# Patient Record
Sex: Male | Born: 1945
Health system: Southern US, Community
[De-identification: ages and names within clinical notes are randomized; demographics above are authoritative.]

## PROBLEM LIST (undated history)

## (undated) DIAGNOSIS — I119 Hypertensive heart disease without heart failure: Secondary | ICD-10-CM

## (undated) DIAGNOSIS — I4819 Other persistent atrial fibrillation: Secondary | ICD-10-CM

## (undated) DIAGNOSIS — N1831 Chronic kidney disease, stage 3a: Secondary | ICD-10-CM

## (undated) DIAGNOSIS — I429 Cardiomyopathy, unspecified: Secondary | ICD-10-CM

## (undated) DIAGNOSIS — I5022 Chronic systolic (congestive) heart failure: Secondary | ICD-10-CM

## (undated) DIAGNOSIS — D649 Anemia, unspecified: Secondary | ICD-10-CM

## (undated) DIAGNOSIS — I272 Pulmonary hypertension, unspecified: Secondary | ICD-10-CM

## (undated) DIAGNOSIS — N179 Acute kidney failure, unspecified: Secondary | ICD-10-CM

## (undated) DIAGNOSIS — C189 Malignant neoplasm of colon, unspecified: Secondary | ICD-10-CM

## (undated) DIAGNOSIS — I2510 Atherosclerotic heart disease of native coronary artery without angina pectoris: Secondary | ICD-10-CM

## (undated) DIAGNOSIS — I10 Essential (primary) hypertension: Secondary | ICD-10-CM

## (undated) DIAGNOSIS — I340 Nonrheumatic mitral (valve) insufficiency: Secondary | ICD-10-CM

## (undated) DIAGNOSIS — D126 Benign neoplasm of colon, unspecified: Secondary | ICD-10-CM

## (undated) DIAGNOSIS — E119 Type 2 diabetes mellitus without complications: Secondary | ICD-10-CM

## (undated) HISTORY — DX: Hypertensive heart disease without heart failure: I11.9

## (undated) HISTORY — DX: Atherosclerotic heart disease of native coronary artery without angina pectoris: I25.10

## (undated) HISTORY — DX: Acute kidney failure, unspecified: N17.9

## (undated) HISTORY — DX: Chronic systolic (congestive) heart failure: I50.22

## (undated) HISTORY — PX: PORTACATH PLACEMENT: SHX2246

## (undated) HISTORY — PX: COLON SURGERY: SHX602

## (undated) HISTORY — DX: Nonrheumatic mitral (valve) insufficiency: I34.0

## (undated) HISTORY — DX: Essential (primary) hypertension: I10

## (undated) HISTORY — PX: ESOPHAGOGASTRODUODENOSCOPY: SHX1529

## (undated) HISTORY — DX: Cardiomyopathy, unspecified: I42.9

## (undated) HISTORY — DX: Malignant neoplasm of colon, unspecified: C18.9

## (undated) HISTORY — PX: HERNIA REPAIR: SHX51

## (undated) HISTORY — PX: COLONOSCOPY: SHX174

## (undated) SURGERY — ECHOCARDIOGRAM, TRANSESOPHAGEAL
Anesthesia: General

---

## 2011-04-18 ENCOUNTER — Ambulatory Visit: Payer: Self-pay | Admitting: General Practice

## 2013-02-05 HISTORY — PX: PORTACATH PLACEMENT: SHX2246

## 2013-06-16 ENCOUNTER — Inpatient Hospital Stay: Payer: Self-pay | Admitting: Surgery

## 2013-06-16 LAB — CBC
HCT: 15 % — CL (ref 40.0–52.0)
HGB: 4.2 g/dL — CL (ref 13.0–18.0)
MCH: 18.5 pg — ABNORMAL LOW (ref 26.0–34.0)
MCHC: 28.3 g/dL — ABNORMAL LOW (ref 32.0–36.0)
MCV: 65 fL — AB (ref 80–100)
PLATELETS: 179 10*3/uL (ref 150–440)
RBC: 2.29 10*6/uL — ABNORMAL LOW (ref 4.40–5.90)
RDW: 21.1 % — ABNORMAL HIGH (ref 11.5–14.5)
WBC: 5.6 10*3/uL (ref 3.8–10.6)

## 2013-06-16 LAB — BASIC METABOLIC PANEL
Anion Gap: 8 (ref 7–16)
BUN: 20 mg/dL — ABNORMAL HIGH (ref 7–18)
CHLORIDE: 107 mmol/L (ref 98–107)
CO2: 26 mmol/L (ref 21–32)
Calcium, Total: 8.7 mg/dL (ref 8.5–10.1)
Creatinine: 1.31 mg/dL — ABNORMAL HIGH (ref 0.60–1.30)
EGFR (African American): >60
EGFR (Non-African Amer.): 56 — ABNORMAL LOW
Glucose: 92 mg/dL (ref 65–99)
OSMOLALITY: 284 (ref 275–301)
POTASSIUM: 3.7 mmol/L (ref 3.5–5.1)
Sodium: 141 mmol/L (ref 136–145)

## 2013-06-16 LAB — CK TOTAL AND CKMB (NOT AT ARMC)
CK, Total: 75 U/L
CK-MB: 1.2 ng/mL (ref 0.5–3.6)

## 2013-06-16 LAB — TROPONIN I: Troponin-I: <0.02 ng/mL

## 2013-06-16 LAB — PRO B NATRIURETIC PEPTIDE: B-Type Natriuretic Peptide: 4287 pg/mL — ABNORMAL HIGH (ref 0–125)

## 2013-06-16 LAB — PROTIME-INR
INR: 1.2
Prothrombin Time: 15.4 secs — ABNORMAL HIGH (ref 11.5–14.7)

## 2013-06-17 DIAGNOSIS — R0609 Other forms of dyspnea: Secondary | ICD-10-CM

## 2013-06-17 DIAGNOSIS — R0989 Other specified symptoms and signs involving the circulatory and respiratory systems: Secondary | ICD-10-CM

## 2013-06-17 DIAGNOSIS — R5383 Other fatigue: Secondary | ICD-10-CM

## 2013-06-17 DIAGNOSIS — R609 Edema, unspecified: Secondary | ICD-10-CM

## 2013-06-17 DIAGNOSIS — I10 Essential (primary) hypertension: Secondary | ICD-10-CM

## 2013-06-17 DIAGNOSIS — D509 Iron deficiency anemia, unspecified: Secondary | ICD-10-CM

## 2013-06-17 DIAGNOSIS — R5381 Other malaise: Secondary | ICD-10-CM

## 2013-06-17 LAB — PROTIME-INR
INR: 1.2
Prothrombin Time: 15.4 secs — ABNORMAL HIGH (ref 11.5–14.7)

## 2013-06-17 LAB — HEMOGLOBIN
HGB: 8.8 g/dL — ABNORMAL LOW (ref 13.0–18.0)
HGB: 9.8 g/dL — ABNORMAL LOW (ref 13.0–18.0)

## 2013-06-17 LAB — RETICULOCYTES
Absolute Retic Count: 0.0529 10*6/uL (ref 0.019–0.186)
Reticulocyte: 1.81 % (ref 0.4–3.1)

## 2013-06-17 LAB — COMPREHENSIVE METABOLIC PANEL
ALBUMIN: 2.5 g/dL — AB (ref 3.4–5.0)
ANION GAP: 8 (ref 7–16)
Alkaline Phosphatase: 87 U/L
BILIRUBIN TOTAL: 1.2 mg/dL — AB (ref 0.2–1.0)
BUN: 19 mg/dL — AB (ref 7–18)
CALCIUM: 8.3 mg/dL — AB (ref 8.5–10.1)
CHLORIDE: 107 mmol/L (ref 98–107)
CO2: 27 mmol/L (ref 21–32)
Creatinine: 1.29 mg/dL (ref 0.60–1.30)
EGFR (Non-African Amer.): 57 — ABNORMAL LOW
Glucose: 92 mg/dL (ref 65–99)
OSMOLALITY: 285 (ref 275–301)
POTASSIUM: 3.8 mmol/L (ref 3.5–5.1)
SGOT(AST): 16 U/L (ref 15–37)
SGPT (ALT): 28 U/L (ref 12–78)
Sodium: 142 mmol/L (ref 136–145)
Total Protein: 6.2 g/dL — ABNORMAL LOW (ref 6.4–8.2)

## 2013-06-17 LAB — FERRITIN: Ferritin (ARMC): 7 ng/mL — ABNORMAL LOW (ref 8–388)

## 2013-06-17 LAB — CBC WITH DIFFERENTIAL/PLATELET
Basophil #: 0.1 10*3/uL (ref 0.0–0.1)
Basophil %: 1 %
EOS ABS: 0 10*3/uL (ref 0.0–0.7)
EOS PCT: 0.4 %
HCT: 20.8 % — ABNORMAL LOW (ref 40.0–52.0)
HGB: 6.3 g/dL — AB (ref 13.0–18.0)
LYMPHS PCT: 12.8 %
Lymphocyte #: 0.9 10*3/uL — ABNORMAL LOW (ref 1.0–3.6)
MCH: 21.8 pg — AB (ref 26.0–34.0)
MCHC: 30.3 g/dL — ABNORMAL LOW (ref 32.0–36.0)
MCV: 72 fL — ABNORMAL LOW (ref 80–100)
MONOS PCT: 11.9 %
Monocyte #: 0.9 x10 3/mm (ref 0.2–1.0)
Neutrophil #: 5.5 10*3/uL (ref 1.4–6.5)
Neutrophil %: 73.9 %
PLATELETS: 157 10*3/uL (ref 150–440)
RBC: 2.9 10*6/uL — ABNORMAL LOW (ref 4.40–5.90)
RDW: 25.2 % — ABNORMAL HIGH (ref 11.5–14.5)
WBC: 7.4 10*3/uL (ref 3.8–10.6)

## 2013-06-17 LAB — IRON AND TIBC
IRON SATURATION: 33 %
IRON: 103 ug/dL (ref 65–175)
Iron Bind.Cap.(Total): 315 ug/dL (ref 250–450)
UNBOUND IRON-BIND. CAP.: 212 ug/dL

## 2013-06-17 LAB — HEMATOCRIT
HCT: 28.2 % — ABNORMAL LOW (ref 40.0–52.0)
HCT: 32.1 % — AB (ref 40.0–52.0)

## 2013-06-17 LAB — LACTATE DEHYDROGENASE: LDH: 164 U/L (ref 85–241)

## 2013-06-18 DIAGNOSIS — I059 Rheumatic mitral valve disease, unspecified: Secondary | ICD-10-CM

## 2013-06-18 LAB — PROTIME-INR
INR: 1.3
Prothrombin Time: 15.9 secs — ABNORMAL HIGH (ref 11.5–14.7)

## 2013-06-18 LAB — CBC WITH DIFFERENTIAL/PLATELET
BASOS ABS: 0.1 10*3/uL (ref 0.0–0.1)
BASOS PCT: 0.6 %
Eosinophil #: 0.1 10*3/uL (ref 0.0–0.7)
Eosinophil %: 1 %
HCT: 28.8 % — AB (ref 40.0–52.0)
HGB: 9 g/dL — ABNORMAL LOW (ref 13.0–18.0)
Lymphocyte #: 0.7 10*3/uL — ABNORMAL LOW (ref 1.0–3.6)
Lymphocyte %: 7.6 %
MCH: 23.4 pg — ABNORMAL LOW (ref 26.0–34.0)
MCHC: 31.1 g/dL — ABNORMAL LOW (ref 32.0–36.0)
MCV: 75 fL — ABNORMAL LOW (ref 80–100)
Monocyte #: 1.1 x10 3/mm — ABNORMAL HIGH (ref 0.2–1.0)
Monocyte %: 12 %
NEUTROS ABS: 7.2 10*3/uL — AB (ref 1.4–6.5)
Neutrophil %: 78.8 %
Platelet: 157 10*3/uL (ref 150–440)
RBC: 3.82 10*6/uL — AB (ref 4.40–5.90)
RDW: 25 % — AB (ref 11.5–14.5)
WBC: 9.1 10*3/uL (ref 3.8–10.6)

## 2013-06-18 LAB — HEMOGLOBIN
HGB: 10.1 g/dL — ABNORMAL LOW (ref 13.0–18.0)
HGB: 11.1 g/dL — ABNORMAL LOW (ref 13.0–18.0)

## 2013-06-18 LAB — APTT: Activated PTT: 32.6 secs (ref 23.6–35.9)

## 2013-06-18 LAB — HEMATOCRIT
HCT: 33.2 % — ABNORMAL LOW (ref 40.0–52.0)
HCT: 37.7 % — ABNORMAL LOW (ref 40.0–52.0)

## 2013-06-19 DIAGNOSIS — C189 Malignant neoplasm of colon, unspecified: Secondary | ICD-10-CM

## 2013-06-19 DIAGNOSIS — D509 Iron deficiency anemia, unspecified: Secondary | ICD-10-CM

## 2013-06-19 DIAGNOSIS — R0609 Other forms of dyspnea: Secondary | ICD-10-CM

## 2013-06-19 DIAGNOSIS — R0989 Other specified symptoms and signs involving the circulatory and respiratory systems: Secondary | ICD-10-CM

## 2013-06-19 DIAGNOSIS — R5381 Other malaise: Secondary | ICD-10-CM

## 2013-06-19 DIAGNOSIS — R5383 Other fatigue: Secondary | ICD-10-CM

## 2013-06-19 DIAGNOSIS — I10 Essential (primary) hypertension: Secondary | ICD-10-CM

## 2013-06-19 HISTORY — DX: Malignant neoplasm of colon, unspecified: C18.9

## 2013-06-19 LAB — BASIC METABOLIC PANEL
Anion Gap: 5 — ABNORMAL LOW (ref 7–16)
BUN: 10 mg/dL (ref 7–18)
CO2: 26 mmol/L (ref 21–32)
Calcium, Total: 8 mg/dL — ABNORMAL LOW (ref 8.5–10.1)
Chloride: 111 mmol/L — ABNORMAL HIGH (ref 98–107)
Creatinine: 1.09 mg/dL (ref 0.60–1.30)
EGFR (African American): >60
Glucose: 77 mg/dL (ref 65–99)
OSMOLALITY: 281 (ref 275–301)
POTASSIUM: 3.5 mmol/L (ref 3.5–5.1)
Sodium: 142 mmol/L (ref 136–145)

## 2013-06-19 LAB — HEMOGLOBIN: HGB: 8.8 g/dL — AB (ref 13.0–18.0)

## 2013-06-19 LAB — BILIRUBIN, DIRECT: Bilirubin, Direct: 0.2 mg/dL (ref 0.00–0.20)

## 2013-06-19 LAB — PROTIME-INR
INR: 1.3
Prothrombin Time: 15.7 secs — ABNORMAL HIGH (ref 11.5–14.7)

## 2013-06-19 LAB — BILIRUBIN, TOTAL: BILIRUBIN TOTAL: 0.7 mg/dL (ref 0.2–1.0)

## 2013-06-20 LAB — CBC WITH DIFFERENTIAL/PLATELET
BASOS ABS: 0.1 10*3/uL (ref 0.0–0.1)
Basophil %: 0.8 %
Eosinophil #: 0.1 10*3/uL (ref 0.0–0.7)
Eosinophil %: 1.7 %
HCT: 27.2 % — ABNORMAL LOW (ref 40.0–52.0)
HGB: 8.4 g/dL — ABNORMAL LOW (ref 13.0–18.0)
LYMPHS ABS: 0.7 10*3/uL — AB (ref 1.0–3.6)
Lymphocyte %: 9.7 %
MCH: 23.6 pg — ABNORMAL LOW (ref 26.0–34.0)
MCHC: 30.9 g/dL — ABNORMAL LOW (ref 32.0–36.0)
MCV: 76 fL — AB (ref 80–100)
MONOS PCT: 14 %
Monocyte #: 1.1 x10 3/mm — ABNORMAL HIGH (ref 0.2–1.0)
Neutrophil #: 5.6 10*3/uL (ref 1.4–6.5)
Neutrophil %: 73.8 %
Platelet: 177 10*3/uL (ref 150–440)
RBC: 3.56 10*6/uL — AB (ref 4.40–5.90)
RDW: 25.5 % — AB (ref 11.5–14.5)
WBC: 7.6 10*3/uL (ref 3.8–10.6)

## 2013-06-21 LAB — CBC WITH DIFFERENTIAL/PLATELET
Basophil #: 0 10*3/uL (ref 0.0–0.1)
Basophil %: 0.5 %
Eosinophil #: 0.1 10*3/uL (ref 0.0–0.7)
Eosinophil %: 1.7 %
HCT: 28.1 % — AB (ref 40.0–52.0)
HGB: 8.8 g/dL — ABNORMAL LOW (ref 13.0–18.0)
LYMPHS ABS: 0.9 10*3/uL — AB (ref 1.0–3.6)
Lymphocyte %: 12.8 %
MCH: 24.1 pg — ABNORMAL LOW (ref 26.0–34.0)
MCHC: 31.4 g/dL — ABNORMAL LOW (ref 32.0–36.0)
MCV: 77 fL — ABNORMAL LOW (ref 80–100)
MONO ABS: 1 x10 3/mm (ref 0.2–1.0)
Monocyte %: 12.9 %
NEUTROS ABS: 5.3 10*3/uL (ref 1.4–6.5)
NEUTROS PCT: 72.1 %
Platelet: 163 10*3/uL (ref 150–440)
RBC: 3.66 10*6/uL — AB (ref 4.40–5.90)
RDW: 24.7 % — ABNORMAL HIGH (ref 11.5–14.5)
WBC: 7.4 10*3/uL (ref 3.8–10.6)

## 2013-06-21 LAB — BASIC METABOLIC PANEL
Anion Gap: 5 — ABNORMAL LOW (ref 7–16)
BUN: 6 mg/dL — AB (ref 7–18)
CALCIUM: 7.6 mg/dL — AB (ref 8.5–10.1)
CREATININE: 0.91 mg/dL (ref 0.60–1.30)
Chloride: 109 mmol/L — ABNORMAL HIGH (ref 98–107)
Co2: 28 mmol/L (ref 21–32)
EGFR (African American): >60
GLUCOSE: 84 mg/dL (ref 65–99)
Osmolality: 280 (ref 275–301)
POTASSIUM: 3.2 mmol/L — AB (ref 3.5–5.1)
SODIUM: 142 mmol/L (ref 136–145)

## 2013-06-22 LAB — CBC WITH DIFFERENTIAL/PLATELET
Basophil #: 0 10*3/uL (ref 0.0–0.1)
Basophil %: 0.4 %
Eosinophil #: 0.2 10*3/uL (ref 0.0–0.7)
Eosinophil %: 2.2 %
HCT: 30.4 % — AB (ref 40.0–52.0)
HGB: 9.5 g/dL — ABNORMAL LOW (ref 13.0–18.0)
LYMPHS ABS: 0.9 10*3/uL — AB (ref 1.0–3.6)
LYMPHS PCT: 11.2 %
MCH: 24.3 pg — ABNORMAL LOW (ref 26.0–34.0)
MCHC: 31.2 g/dL — ABNORMAL LOW (ref 32.0–36.0)
MCV: 78 fL — AB (ref 80–100)
MONO ABS: 1.1 x10 3/mm — AB (ref 0.2–1.0)
MONOS PCT: 12.9 %
Neutrophil #: 6 10*3/uL (ref 1.4–6.5)
Neutrophil %: 73.3 %
Platelet: 167 10*3/uL (ref 150–440)
RBC: 3.91 10*6/uL — AB (ref 4.40–5.90)
RDW: 24.7 % — AB (ref 11.5–14.5)
WBC: 8.2 10*3/uL (ref 3.8–10.6)

## 2013-06-22 LAB — PROTIME-INR
INR: 1
Prothrombin Time: 12.8 secs (ref 11.5–14.7)

## 2013-06-22 LAB — BASIC METABOLIC PANEL
ANION GAP: 4 — AB (ref 7–16)
BUN: 10 mg/dL (ref 7–18)
CREATININE: 1.01 mg/dL (ref 0.60–1.30)
Calcium, Total: 7.9 mg/dL — ABNORMAL LOW (ref 8.5–10.1)
Chloride: 108 mmol/L — ABNORMAL HIGH (ref 98–107)
Co2: 29 mmol/L (ref 21–32)
EGFR (African American): >60
Glucose: 91 mg/dL (ref 65–99)
OSMOLALITY: 280 (ref 275–301)
Potassium: 3.6 mmol/L (ref 3.5–5.1)
Sodium: 141 mmol/L (ref 136–145)

## 2013-06-22 LAB — CEA: CEA: 4.3 ng/mL (ref 0.0–4.7)

## 2013-06-22 LAB — MAGNESIUM: Magnesium: 1.7 mg/dL — ABNORMAL LOW

## 2013-06-23 LAB — BASIC METABOLIC PANEL
Anion Gap: 5 — ABNORMAL LOW (ref 7–16)
BUN: 6 mg/dL — AB (ref 7–18)
CHLORIDE: 109 mmol/L — AB (ref 98–107)
CREATININE: 1.02 mg/dL (ref 0.60–1.30)
Calcium, Total: 8 mg/dL — ABNORMAL LOW (ref 8.5–10.1)
Co2: 28 mmol/L (ref 21–32)
EGFR (Non-African Amer.): >60
Glucose: 77 mg/dL (ref 65–99)
OSMOLALITY: 280 (ref 275–301)
Potassium: 3.6 mmol/L (ref 3.5–5.1)
SODIUM: 142 mmol/L (ref 136–145)

## 2013-06-23 LAB — CBC WITH DIFFERENTIAL/PLATELET
Basophil #: 0 10*3/uL (ref 0.0–0.1)
Basophil %: 0.7 %
EOS ABS: 0.1 10*3/uL (ref 0.0–0.7)
Eosinophil %: 2 %
HCT: 30 % — AB (ref 40.0–52.0)
HGB: 9.7 g/dL — AB (ref 13.0–18.0)
Lymphocyte #: 0.9 10*3/uL — ABNORMAL LOW (ref 1.0–3.6)
Lymphocyte %: 12.6 %
MCH: 25.2 pg — ABNORMAL LOW (ref 26.0–34.0)
MCHC: 32.4 g/dL (ref 32.0–36.0)
MCV: 78 fL — ABNORMAL LOW (ref 80–100)
MONO ABS: 0.9 x10 3/mm (ref 0.2–1.0)
MONOS PCT: 13.2 %
NEUTROS PCT: 71.5 %
Neutrophil #: 5.1 10*3/uL (ref 1.4–6.5)
Platelet: 157 10*3/uL (ref 150–440)
RBC: 3.86 10*6/uL — ABNORMAL LOW (ref 4.40–5.90)
RDW: 24.5 % — AB (ref 11.5–14.5)
WBC: 7.1 10*3/uL (ref 3.8–10.6)

## 2013-06-23 LAB — MAGNESIUM: MAGNESIUM: 1.9 mg/dL

## 2013-06-24 LAB — CBC WITH DIFFERENTIAL/PLATELET
Basophil #: 0.1 10*3/uL (ref 0.0–0.1)
Basophil %: 0.7 %
EOS PCT: 0 %
Eosinophil #: 0 10*3/uL (ref 0.0–0.7)
HCT: 35.2 % — ABNORMAL LOW (ref 40.0–52.0)
HGB: 10.8 g/dL — AB (ref 13.0–18.0)
LYMPHS ABS: 0.5 10*3/uL — AB (ref 1.0–3.6)
LYMPHS PCT: 3.4 %
MCH: 24.7 pg — AB (ref 26.0–34.0)
MCHC: 30.6 g/dL — AB (ref 32.0–36.0)
MCV: 81 fL (ref 80–100)
MONO ABS: 1 x10 3/mm (ref 0.2–1.0)
Monocyte %: 7 %
NEUTROS ABS: 12.5 10*3/uL — AB (ref 1.4–6.5)
NEUTROS PCT: 88.9 %
Platelet: 183 10*3/uL (ref 150–440)
RBC: 4.37 10*6/uL — ABNORMAL LOW (ref 4.40–5.90)
RDW: 24.8 % — ABNORMAL HIGH (ref 11.5–14.5)
WBC: 14.1 10*3/uL — ABNORMAL HIGH (ref 3.8–10.6)

## 2013-06-24 LAB — BASIC METABOLIC PANEL
Anion Gap: 10 (ref 7–16)
BUN: 10 mg/dL (ref 7–18)
CALCIUM: 8.1 mg/dL — AB (ref 8.5–10.1)
CHLORIDE: 109 mmol/L — AB (ref 98–107)
CO2: 22 mmol/L (ref 21–32)
CREATININE: 1.73 mg/dL — AB (ref 0.60–1.30)
GFR CALC AF AMER: 47 — AB
GFR CALC NON AF AMER: 40 — AB
Glucose: 177 mg/dL — ABNORMAL HIGH (ref 65–99)
Osmolality: 285 (ref 275–301)
Potassium: 4.8 mmol/L (ref 3.5–5.1)
Sodium: 141 mmol/L (ref 136–145)

## 2013-06-24 LAB — PATHOLOGY REPORT

## 2013-06-25 LAB — BASIC METABOLIC PANEL
ANION GAP: 8 (ref 7–16)
BUN: 10 mg/dL (ref 7–18)
CO2: 25 mmol/L (ref 21–32)
Calcium, Total: 8.1 mg/dL — ABNORMAL LOW (ref 8.5–10.1)
Chloride: 109 mmol/L — ABNORMAL HIGH (ref 98–107)
Creatinine: 1.09 mg/dL (ref 0.60–1.30)
EGFR (Non-African Amer.): >60
Glucose: 97 mg/dL (ref 65–99)
Osmolality: 282 (ref 275–301)
POTASSIUM: 5 mmol/L (ref 3.5–5.1)
Sodium: 142 mmol/L (ref 136–145)

## 2013-06-26 LAB — PATHOLOGY REPORT

## 2013-07-08 ENCOUNTER — Ambulatory Visit: Payer: Self-pay | Admitting: Hematology and Oncology

## 2013-07-08 LAB — BASIC METABOLIC PANEL
Anion Gap: 5 — ABNORMAL LOW (ref 7–16)
BUN: 13 mg/dL (ref 7–18)
CHLORIDE: 103 mmol/L (ref 98–107)
Calcium, Total: 9.3 mg/dL (ref 8.5–10.1)
Co2: 35 mmol/L — ABNORMAL HIGH (ref 21–32)
Creatinine: 1.24 mg/dL (ref 0.60–1.30)
EGFR (Non-African Amer.): >60
Glucose: 84 mg/dL (ref 65–99)
Osmolality: 284 (ref 275–301)
POTASSIUM: 3.7 mmol/L (ref 3.5–5.1)
Sodium: 143 mmol/L (ref 136–145)

## 2013-07-15 ENCOUNTER — Ambulatory Visit: Payer: Self-pay | Admitting: Surgery

## 2013-07-15 ENCOUNTER — Ambulatory Visit: Payer: Self-pay | Admitting: Hematology and Oncology

## 2013-07-15 LAB — POTASSIUM: Potassium: 3.9 mmol/L (ref 3.5–5.1)

## 2013-07-20 ENCOUNTER — Ambulatory Visit: Payer: Self-pay | Admitting: Surgery

## 2013-07-22 LAB — COMPREHENSIVE METABOLIC PANEL
ALK PHOS: 64 U/L
ANION GAP: 6 — AB (ref 7–16)
Albumin: 3.1 g/dL — ABNORMAL LOW (ref 3.4–5.0)
BUN: 10 mg/dL (ref 7–18)
Bilirubin,Total: 0.4 mg/dL (ref 0.2–1.0)
CALCIUM: 9.1 mg/dL (ref 8.5–10.1)
CHLORIDE: 108 mmol/L — AB (ref 98–107)
CO2: 30 mmol/L (ref 21–32)
CREATININE: 1.05 mg/dL (ref 0.60–1.30)
EGFR (African American): >60
Glucose: 92 mg/dL (ref 65–99)
Osmolality: 286 (ref 275–301)
Potassium: 3.5 mmol/L (ref 3.5–5.1)
SGOT(AST): 14 U/L — ABNORMAL LOW (ref 15–37)
SGPT (ALT): 20 U/L (ref 12–78)
Sodium: 144 mmol/L (ref 136–145)
Total Protein: 7.1 g/dL (ref 6.4–8.2)

## 2013-07-22 LAB — CBC CANCER CENTER
Basophil #: 0 x10 3/mm (ref 0.0–0.1)
Basophil %: 0.6 %
EOS ABS: 0.1 x10 3/mm (ref 0.0–0.7)
EOS PCT: 1.9 %
HCT: 31 % — AB (ref 40.0–52.0)
HGB: 9.9 g/dL — AB (ref 13.0–18.0)
Lymphocyte #: 1 x10 3/mm (ref 1.0–3.6)
Lymphocyte %: 23.6 %
MCH: 26.4 pg (ref 26.0–34.0)
MCHC: 31.9 g/dL — ABNORMAL LOW (ref 32.0–36.0)
MCV: 83 fL (ref 80–100)
MONOS PCT: 12 %
Monocyte #: 0.5 x10 3/mm (ref 0.2–1.0)
Neutrophil #: 2.7 x10 3/mm (ref 1.4–6.5)
Neutrophil %: 61.9 %
Platelet: 128 x10 3/mm — ABNORMAL LOW (ref 150–440)
RBC: 3.76 10*6/uL — ABNORMAL LOW (ref 4.40–5.90)
RDW: 25.2 % — ABNORMAL HIGH (ref 11.5–14.5)
WBC: 4.4 x10 3/mm (ref 3.8–10.6)

## 2013-07-23 LAB — CEA: CEA: 1.5 ng/mL (ref 0.0–4.7)

## 2013-08-05 ENCOUNTER — Ambulatory Visit: Payer: Self-pay | Admitting: Hematology and Oncology

## 2013-08-05 LAB — CBC CANCER CENTER
Basophil #: 0 x10 3/mm (ref 0.0–0.1)
Basophil %: 0.7 %
EOS PCT: 2.2 %
Eosinophil #: 0.1 x10 3/mm (ref 0.0–0.7)
HCT: 30.3 % — ABNORMAL LOW (ref 40.0–52.0)
HGB: 9.8 g/dL — ABNORMAL LOW (ref 13.0–18.0)
LYMPHS ABS: 0.7 x10 3/mm — AB (ref 1.0–3.6)
LYMPHS PCT: 22.6 %
MCH: 26.9 pg (ref 26.0–34.0)
MCHC: 32.3 g/dL (ref 32.0–36.0)
MCV: 83 fL (ref 80–100)
MONO ABS: 0.5 x10 3/mm (ref 0.2–1.0)
Monocyte %: 14.9 %
NEUTROS ABS: 1.8 x10 3/mm (ref 1.4–6.5)
Neutrophil %: 59.6 %
Platelet: 103 x10 3/mm — ABNORMAL LOW (ref 150–440)
RBC: 3.65 10*6/uL — AB (ref 4.40–5.90)
RDW: 23.7 % — AB (ref 11.5–14.5)
WBC: 3.1 x10 3/mm — ABNORMAL LOW (ref 3.8–10.6)

## 2013-08-05 LAB — COMPREHENSIVE METABOLIC PANEL
AST: 15 U/L (ref 15–37)
Albumin: 3.1 g/dL — ABNORMAL LOW (ref 3.4–5.0)
Alkaline Phosphatase: 68 U/L
Anion Gap: 5 — ABNORMAL LOW (ref 7–16)
BUN: 10 mg/dL (ref 7–18)
Bilirubin,Total: 0.6 mg/dL (ref 0.2–1.0)
CALCIUM: 8.5 mg/dL (ref 8.5–10.1)
CREATININE: 1 mg/dL (ref 0.60–1.30)
Chloride: 108 mmol/L — ABNORMAL HIGH (ref 98–107)
Co2: 31 mmol/L (ref 21–32)
EGFR (African American): >60
GLUCOSE: 88 mg/dL (ref 65–99)
OSMOLALITY: 285 (ref 275–301)
Potassium: 3.6 mmol/L (ref 3.5–5.1)
SGPT (ALT): 14 U/L (ref 12–78)
SODIUM: 144 mmol/L (ref 136–145)
Total Protein: 6.8 g/dL (ref 6.4–8.2)

## 2013-08-19 LAB — CBC CANCER CENTER
BASOS PCT: 0.7 %
Basophil #: 0 x10 3/mm (ref 0.0–0.1)
EOS ABS: 0 x10 3/mm (ref 0.0–0.7)
EOS PCT: 0.4 %
HCT: 30.3 % — AB (ref 40.0–52.0)
HGB: 10.1 g/dL — AB (ref 13.0–18.0)
Lymphocyte #: 0.8 x10 3/mm — ABNORMAL LOW (ref 1.0–3.6)
Lymphocyte %: 26.8 %
MCH: 28.3 pg (ref 26.0–34.0)
MCHC: 33.3 g/dL (ref 32.0–36.0)
MCV: 85 fL (ref 80–100)
MONO ABS: 0.6 x10 3/mm (ref 0.2–1.0)
MONOS PCT: 18.9 %
Neutrophil #: 1.6 x10 3/mm (ref 1.4–6.5)
Neutrophil %: 53.2 %
PLATELETS: 98 x10 3/mm — AB (ref 150–440)
RBC: 3.57 10*6/uL — ABNORMAL LOW (ref 4.40–5.90)
RDW: 22.4 % — ABNORMAL HIGH (ref 11.5–14.5)
WBC: 2.9 x10 3/mm — ABNORMAL LOW (ref 3.8–10.6)

## 2013-08-19 LAB — COMPREHENSIVE METABOLIC PANEL
ALBUMIN: 3.2 g/dL — AB (ref 3.4–5.0)
ALK PHOS: 74 U/L
Anion Gap: 9 (ref 7–16)
BILIRUBIN TOTAL: 0.5 mg/dL (ref 0.2–1.0)
BUN: 9 mg/dL (ref 7–18)
Calcium, Total: 8.5 mg/dL (ref 8.5–10.1)
Chloride: 108 mmol/L — ABNORMAL HIGH (ref 98–107)
Co2: 27 mmol/L (ref 21–32)
Creatinine: 1.12 mg/dL (ref 0.60–1.30)
GLUCOSE: 141 mg/dL — AB (ref 65–99)
Osmolality: 288 (ref 275–301)
Potassium: 3.1 mmol/L — ABNORMAL LOW (ref 3.5–5.1)
SGOT(AST): 16 U/L (ref 15–37)
SGPT (ALT): 14 U/L (ref 12–78)
SODIUM: 144 mmol/L (ref 136–145)
TOTAL PROTEIN: 6.7 g/dL (ref 6.4–8.2)

## 2013-08-26 LAB — CBC CANCER CENTER
BASOS PCT: 1.1 %
Basophil #: 0 x10 3/mm (ref 0.0–0.1)
Eosinophil #: 0 x10 3/mm (ref 0.0–0.7)
Eosinophil %: 0.4 %
HCT: 32.1 % — AB (ref 40.0–52.0)
HGB: 10.6 g/dL — ABNORMAL LOW (ref 13.0–18.0)
LYMPHS ABS: 0.9 x10 3/mm — AB (ref 1.0–3.6)
LYMPHS PCT: 31.6 %
MCH: 28.8 pg (ref 26.0–34.0)
MCHC: 32.8 g/dL (ref 32.0–36.0)
MCV: 88 fL (ref 80–100)
MONO ABS: 0.8 x10 3/mm (ref 0.2–1.0)
MONOS PCT: 30.3 %
NEUTROS ABS: 1 x10 3/mm — AB (ref 1.4–6.5)
Neutrophil %: 36.6 %
PLATELETS: 163 x10 3/mm (ref 150–440)
RBC: 3.67 10*6/uL — AB (ref 4.40–5.90)
RDW: 22 % — AB (ref 11.5–14.5)
WBC: 2.7 x10 3/mm — ABNORMAL LOW (ref 3.8–10.6)

## 2013-08-26 LAB — COMPREHENSIVE METABOLIC PANEL
ALK PHOS: 77 U/L
Albumin: 3.2 g/dL — ABNORMAL LOW (ref 3.4–5.0)
Anion Gap: 6 — ABNORMAL LOW (ref 7–16)
BUN: 9 mg/dL (ref 7–18)
Bilirubin,Total: 0.7 mg/dL (ref 0.2–1.0)
CREATININE: 0.93 mg/dL (ref 0.60–1.30)
Calcium, Total: 8.7 mg/dL (ref 8.5–10.1)
Chloride: 106 mmol/L (ref 98–107)
Co2: 29 mmol/L (ref 21–32)
EGFR (Non-African Amer.): >60
Glucose: 84 mg/dL (ref 65–99)
Osmolality: 279 (ref 275–301)
POTASSIUM: 4.1 mmol/L (ref 3.5–5.1)
SGOT(AST): 21 U/L (ref 15–37)
SGPT (ALT): 15 U/L
SODIUM: 141 mmol/L (ref 136–145)
TOTAL PROTEIN: 6.8 g/dL (ref 6.4–8.2)

## 2013-09-02 LAB — CBC CANCER CENTER
BASOS PCT: 0.9 %
Basophil #: 0 x10 3/mm (ref 0.0–0.1)
EOS ABS: 0 x10 3/mm (ref 0.0–0.7)
Eosinophil %: 1 %
HCT: 32.5 % — ABNORMAL LOW (ref 40.0–52.0)
HGB: 11 g/dL — ABNORMAL LOW (ref 13.0–18.0)
LYMPHS ABS: 0.7 x10 3/mm — AB (ref 1.0–3.6)
Lymphocyte %: 16.3 %
MCH: 30.2 pg (ref 26.0–34.0)
MCHC: 34 g/dL (ref 32.0–36.0)
MCV: 89 fL (ref 80–100)
MONOS PCT: 16.5 %
Monocyte #: 0.7 x10 3/mm (ref 0.2–1.0)
NEUTROS PCT: 65.3 %
Neutrophil #: 2.9 x10 3/mm (ref 1.4–6.5)
PLATELETS: 168 x10 3/mm (ref 150–440)
RBC: 3.66 10*6/uL — ABNORMAL LOW (ref 4.40–5.90)
RDW: 20.6 % — ABNORMAL HIGH (ref 11.5–14.5)
WBC: 4.5 x10 3/mm (ref 3.8–10.6)

## 2013-09-02 LAB — COMPREHENSIVE METABOLIC PANEL
ALBUMIN: 3.4 g/dL (ref 3.4–5.0)
ALT: 17 U/L
Alkaline Phosphatase: 76 U/L
Anion Gap: 6 — ABNORMAL LOW (ref 7–16)
BILIRUBIN TOTAL: 0.6 mg/dL (ref 0.2–1.0)
BUN: 13 mg/dL (ref 7–18)
CREATININE: 1.19 mg/dL (ref 0.60–1.30)
Calcium, Total: 9.3 mg/dL (ref 8.5–10.1)
Chloride: 107 mmol/L (ref 98–107)
Co2: 29 mmol/L (ref 21–32)
EGFR (African American): >60
Glucose: 96 mg/dL (ref 65–99)
OSMOLALITY: 283 (ref 275–301)
Potassium: 3.9 mmol/L (ref 3.5–5.1)
SGOT(AST): 19 U/L (ref 15–37)
SODIUM: 142 mmol/L (ref 136–145)
Total Protein: 7.1 g/dL (ref 6.4–8.2)

## 2013-09-05 ENCOUNTER — Ambulatory Visit: Payer: Self-pay | Admitting: Hematology and Oncology

## 2013-09-16 LAB — COMPREHENSIVE METABOLIC PANEL
ALBUMIN: 3.2 g/dL — AB (ref 3.4–5.0)
ANION GAP: 7 (ref 7–16)
Alkaline Phosphatase: 97 U/L
BILIRUBIN TOTAL: 0.4 mg/dL (ref 0.2–1.0)
BUN: 7 mg/dL (ref 7–18)
CALCIUM: 8.6 mg/dL (ref 8.5–10.1)
CHLORIDE: 106 mmol/L (ref 98–107)
CO2: 29 mmol/L (ref 21–32)
CREATININE: 0.99 mg/dL (ref 0.60–1.30)
EGFR (African American): >60
EGFR (Non-African Amer.): >60
Glucose: 96 mg/dL (ref 65–99)
OSMOLALITY: 281 (ref 275–301)
POTASSIUM: 3.6 mmol/L (ref 3.5–5.1)
SGOT(AST): 20 U/L (ref 15–37)
SGPT (ALT): 20 U/L
Sodium: 142 mmol/L (ref 136–145)
Total Protein: 6.7 g/dL (ref 6.4–8.2)

## 2013-09-16 LAB — CBC CANCER CENTER
BASOS ABS: 0 x10 3/mm (ref 0.0–0.1)
Basophil %: 0.4 %
Eosinophil #: 0.1 x10 3/mm (ref 0.0–0.7)
Eosinophil %: 0.9 %
HCT: 33.6 % — AB (ref 40.0–52.0)
HGB: 11 g/dL — ABNORMAL LOW (ref 13.0–18.0)
LYMPHS ABS: 1 x10 3/mm (ref 1.0–3.6)
Lymphocyte %: 11.1 %
MCH: 29.8 pg (ref 26.0–34.0)
MCHC: 32.8 g/dL (ref 32.0–36.0)
MCV: 91 fL (ref 80–100)
Monocyte #: 0.8 x10 3/mm (ref 0.2–1.0)
Monocyte %: 9.3 %
Neutrophil #: 7.1 x10 3/mm — ABNORMAL HIGH (ref 1.4–6.5)
Neutrophil %: 78.3 %
Platelet: 111 x10 3/mm — ABNORMAL LOW (ref 150–440)
RBC: 3.7 10*6/uL — ABNORMAL LOW (ref 4.40–5.90)
RDW: 18.4 % — ABNORMAL HIGH (ref 11.5–14.5)
WBC: 9.1 x10 3/mm (ref 3.8–10.6)

## 2013-09-30 LAB — CBC CANCER CENTER
BASOS ABS: 0 x10 3/mm (ref 0.0–0.1)
Basophil %: 0.3 %
Eosinophil #: 0 x10 3/mm (ref 0.0–0.7)
Eosinophil %: 0.5 %
HCT: 34.2 % — ABNORMAL LOW (ref 40.0–52.0)
HGB: 11.4 g/dL — AB (ref 13.0–18.0)
LYMPHS ABS: 1.1 x10 3/mm (ref 1.0–3.6)
LYMPHS PCT: 11 %
MCH: 30.4 pg (ref 26.0–34.0)
MCHC: 33.2 g/dL (ref 32.0–36.0)
MCV: 92 fL (ref 80–100)
MONO ABS: 1.3 x10 3/mm — AB (ref 0.2–1.0)
Monocyte %: 12.5 %
Neutrophil #: 7.6 x10 3/mm — ABNORMAL HIGH (ref 1.4–6.5)
Neutrophil %: 75.7 %
Platelet: 127 x10 3/mm — ABNORMAL LOW (ref 150–440)
RBC: 3.74 10*6/uL — ABNORMAL LOW (ref 4.40–5.90)
RDW: 16.9 % — ABNORMAL HIGH (ref 11.5–14.5)
WBC: 10 x10 3/mm (ref 3.8–10.6)

## 2013-09-30 LAB — COMPREHENSIVE METABOLIC PANEL
Albumin: 3.2 g/dL — ABNORMAL LOW (ref 3.4–5.0)
Alkaline Phosphatase: 111 U/L
Anion Gap: 8 (ref 7–16)
BUN: 7 mg/dL (ref 7–18)
Bilirubin,Total: 0.3 mg/dL (ref 0.2–1.0)
CHLORIDE: 107 mmol/L (ref 98–107)
Calcium, Total: 8.6 mg/dL (ref 8.5–10.1)
Co2: 29 mmol/L (ref 21–32)
Creatinine: 1.18 mg/dL (ref 0.60–1.30)
EGFR (African American): >60
Glucose: 102 mg/dL — ABNORMAL HIGH (ref 65–99)
Osmolality: 285 (ref 275–301)
Potassium: 3.7 mmol/L (ref 3.5–5.1)
SGOT(AST): 27 U/L (ref 15–37)
SGPT (ALT): 36 U/L
SODIUM: 144 mmol/L (ref 136–145)
Total Protein: 7 g/dL (ref 6.4–8.2)

## 2013-10-06 ENCOUNTER — Ambulatory Visit: Payer: Self-pay | Admitting: Hematology and Oncology

## 2013-10-14 LAB — CBC CANCER CENTER
BASOS ABS: 0 x10 3/mm (ref 0.0–0.1)
Basophil %: 0.2 %
EOS ABS: 0 x10 3/mm (ref 0.0–0.7)
Eosinophil %: 0.3 %
HCT: 33.5 % — ABNORMAL LOW (ref 40.0–52.0)
HGB: 11.1 g/dL — ABNORMAL LOW (ref 13.0–18.0)
LYMPHS ABS: 1.3 x10 3/mm (ref 1.0–3.6)
Lymphocyte %: 10.7 %
MCH: 30 pg (ref 26.0–34.0)
MCHC: 33.2 g/dL (ref 32.0–36.0)
MCV: 90 fL (ref 80–100)
MONO ABS: 1.3 x10 3/mm — AB (ref 0.2–1.0)
Monocyte %: 10.8 %
NEUTROS PCT: 78 %
Neutrophil #: 9.4 x10 3/mm — ABNORMAL HIGH (ref 1.4–6.5)
Platelet: 111 x10 3/mm — ABNORMAL LOW (ref 150–440)
RBC: 3.71 10*6/uL — ABNORMAL LOW (ref 4.40–5.90)
RDW: 16.9 % — ABNORMAL HIGH (ref 11.5–14.5)
WBC: 12 x10 3/mm — AB (ref 3.8–10.6)

## 2013-10-14 LAB — COMPREHENSIVE METABOLIC PANEL
ALBUMIN: 3.3 g/dL — AB (ref 3.4–5.0)
ALT: 24 U/L
Alkaline Phosphatase: 119 U/L — ABNORMAL HIGH
Anion Gap: 7 (ref 7–16)
BUN: 7 mg/dL (ref 7–18)
Bilirubin,Total: 0.4 mg/dL (ref 0.2–1.0)
CHLORIDE: 108 mmol/L — AB (ref 98–107)
CREATININE: 1.1 mg/dL (ref 0.60–1.30)
Calcium, Total: 8.4 mg/dL — ABNORMAL LOW (ref 8.5–10.1)
Co2: 29 mmol/L (ref 21–32)
EGFR (African American): >60
GLUCOSE: 91 mg/dL (ref 65–99)
Osmolality: 284 (ref 275–301)
POTASSIUM: 3.3 mmol/L — AB (ref 3.5–5.1)
SGOT(AST): 22 U/L (ref 15–37)
Sodium: 144 mmol/L (ref 136–145)
TOTAL PROTEIN: 7 g/dL (ref 6.4–8.2)

## 2013-10-28 LAB — CBC CANCER CENTER
Basophil #: 0 x10 3/mm (ref 0.0–0.1)
Basophil %: 0.5 %
EOS PCT: 1.2 %
Eosinophil #: 0.1 x10 3/mm (ref 0.0–0.7)
HCT: 35.3 % — ABNORMAL LOW (ref 40.0–52.0)
HGB: 11.6 g/dL — ABNORMAL LOW (ref 13.0–18.0)
LYMPHS ABS: 1 x10 3/mm (ref 1.0–3.6)
LYMPHS PCT: 15 %
MCH: 30.3 pg (ref 26.0–34.0)
MCHC: 32.9 g/dL (ref 32.0–36.0)
MCV: 92 fL (ref 80–100)
Monocyte #: 1.3 x10 3/mm — ABNORMAL HIGH (ref 0.2–1.0)
Monocyte %: 18.5 %
Neutrophil #: 4.4 x10 3/mm (ref 1.4–6.5)
Neutrophil %: 64.8 %
Platelet: 100 x10 3/mm — ABNORMAL LOW (ref 150–440)
RBC: 3.84 10*6/uL — AB (ref 4.40–5.90)
RDW: 17.9 % — AB (ref 11.5–14.5)
WBC: 6.9 x10 3/mm (ref 3.8–10.6)

## 2013-10-28 LAB — COMPREHENSIVE METABOLIC PANEL
ALT: 31 U/L
Albumin: 3.4 g/dL (ref 3.4–5.0)
Alkaline Phosphatase: 129 U/L — ABNORMAL HIGH
Anion Gap: 7 (ref 7–16)
BUN: 9 mg/dL (ref 7–18)
Bilirubin,Total: 0.3 mg/dL (ref 0.2–1.0)
CALCIUM: 9.2 mg/dL (ref 8.5–10.1)
CREATININE: 1.04 mg/dL (ref 0.60–1.30)
Chloride: 105 mmol/L (ref 98–107)
Co2: 28 mmol/L (ref 21–32)
EGFR (African American): >60
EGFR (Non-African Amer.): >60
Glucose: 112 mg/dL — ABNORMAL HIGH (ref 65–99)
Osmolality: 279 (ref 275–301)
Potassium: 3.6 mmol/L (ref 3.5–5.1)
SGOT(AST): 25 U/L (ref 15–37)
Sodium: 140 mmol/L (ref 136–145)
TOTAL PROTEIN: 6.9 g/dL (ref 6.4–8.2)

## 2013-11-05 ENCOUNTER — Ambulatory Visit: Payer: Self-pay | Admitting: Hematology and Oncology

## 2013-11-11 LAB — COMPREHENSIVE METABOLIC PANEL
ALBUMIN: 3.4 g/dL (ref 3.4–5.0)
ANION GAP: 8 (ref 7–16)
Alkaline Phosphatase: 119 U/L — ABNORMAL HIGH
BILIRUBIN TOTAL: 0.4 mg/dL (ref 0.2–1.0)
BUN: 8 mg/dL (ref 7–18)
CO2: 28 mmol/L (ref 21–32)
Calcium, Total: 8.8 mg/dL (ref 8.5–10.1)
Chloride: 107 mmol/L (ref 98–107)
Creatinine: 1.26 mg/dL (ref 0.60–1.30)
EGFR (African American): >60
Glucose: 165 mg/dL — ABNORMAL HIGH (ref 65–99)
OSMOLALITY: 287 (ref 275–301)
Potassium: 4.1 mmol/L (ref 3.5–5.1)
SGOT(AST): 20 U/L (ref 15–37)
SGPT (ALT): 20 U/L
Sodium: 143 mmol/L (ref 136–145)
Total Protein: 6.7 g/dL (ref 6.4–8.2)

## 2013-11-11 LAB — CBC CANCER CENTER
Basophil #: 0 x10 3/mm (ref 0.0–0.1)
Basophil %: 0.4 %
EOS ABS: 0 x10 3/mm (ref 0.0–0.7)
Eosinophil %: 0.4 %
HCT: 34.1 % — ABNORMAL LOW (ref 40.0–52.0)
HGB: 11.2 g/dL — ABNORMAL LOW (ref 13.0–18.0)
LYMPHS PCT: 12 %
Lymphocyte #: 1 x10 3/mm (ref 1.0–3.6)
MCH: 30.7 pg (ref 26.0–34.0)
MCHC: 33 g/dL (ref 32.0–36.0)
MCV: 93 fL (ref 80–100)
Monocyte #: 1.1 x10 3/mm — ABNORMAL HIGH (ref 0.2–1.0)
Monocyte %: 13.2 %
Neutrophil #: 6.1 x10 3/mm (ref 1.4–6.5)
Neutrophil %: 74 %
Platelet: 94 x10 3/mm — ABNORMAL LOW (ref 150–440)
RBC: 3.66 10*6/uL — AB (ref 4.40–5.90)
RDW: 18.5 % — ABNORMAL HIGH (ref 11.5–14.5)
WBC: 8.2 x10 3/mm (ref 3.8–10.6)

## 2013-11-25 LAB — CBC CANCER CENTER
Basophil #: 0 x10 3/mm (ref 0.0–0.1)
Basophil %: 0.6 %
EOS ABS: 0 x10 3/mm (ref 0.0–0.7)
Eosinophil %: 0.5 %
HCT: 33.2 % — ABNORMAL LOW (ref 40.0–52.0)
HGB: 11 g/dL — AB (ref 13.0–18.0)
LYMPHS PCT: 15.7 %
Lymphocyte #: 1.1 x10 3/mm (ref 1.0–3.6)
MCH: 30.8 pg (ref 26.0–34.0)
MCHC: 33 g/dL (ref 32.0–36.0)
MCV: 93 fL (ref 80–100)
Monocyte #: 0.8 x10 3/mm (ref 0.2–1.0)
Monocyte %: 11.8 %
Neutrophil #: 4.8 x10 3/mm (ref 1.4–6.5)
Neutrophil %: 71.4 %
Platelet: 81 x10 3/mm — ABNORMAL LOW (ref 150–440)
RBC: 3.56 10*6/uL — ABNORMAL LOW (ref 4.40–5.90)
RDW: 18.3 % — ABNORMAL HIGH (ref 11.5–14.5)
WBC: 6.8 x10 3/mm (ref 3.8–10.6)

## 2013-11-25 LAB — BASIC METABOLIC PANEL
ANION GAP: 7 (ref 7–16)
BUN: 5 mg/dL — ABNORMAL LOW (ref 7–18)
CHLORIDE: 105 mmol/L (ref 98–107)
CO2: 29 mmol/L (ref 21–32)
Calcium, Total: 8.5 mg/dL (ref 8.5–10.1)
Creatinine: 1.17 mg/dL (ref 0.60–1.30)
Glucose: 155 mg/dL — ABNORMAL HIGH (ref 65–99)
OSMOLALITY: 282 (ref 275–301)
Potassium: 4 mmol/L (ref 3.5–5.1)
Sodium: 141 mmol/L (ref 136–145)

## 2013-12-02 LAB — CBC CANCER CENTER
BASOS PCT: 0.4 %
Basophil #: 0.1 x10 3/mm (ref 0.0–0.1)
EOS PCT: 0.1 %
Eosinophil #: 0 x10 3/mm (ref 0.0–0.7)
HCT: 32 % — AB (ref 40.0–52.0)
HGB: 10.4 g/dL — AB (ref 13.0–18.0)
Lymphocyte #: 0.9 x10 3/mm — ABNORMAL LOW (ref 1.0–3.6)
Lymphocyte %: 6.9 %
MCH: 30.8 pg (ref 26.0–34.0)
MCHC: 32.5 g/dL (ref 32.0–36.0)
MCV: 95 fL (ref 80–100)
MONO ABS: 1.4 x10 3/mm — AB (ref 0.2–1.0)
MONOS PCT: 10.5 %
NEUTROS PCT: 82.1 %
Neutrophil #: 10.9 x10 3/mm — ABNORMAL HIGH (ref 1.4–6.5)
Platelet: 99 x10 3/mm — ABNORMAL LOW (ref 150–440)
RBC: 3.37 10*6/uL — ABNORMAL LOW (ref 4.40–5.90)
RDW: 18.5 % — ABNORMAL HIGH (ref 11.5–14.5)
WBC: 13.3 x10 3/mm — ABNORMAL HIGH (ref 3.8–10.6)

## 2013-12-06 ENCOUNTER — Ambulatory Visit: Payer: Self-pay | Admitting: Hematology and Oncology

## 2013-12-09 LAB — CBC CANCER CENTER
BASOS ABS: 0 x10 3/mm (ref 0.0–0.1)
BASOS PCT: 0.4 %
Eosinophil #: 0 x10 3/mm (ref 0.0–0.7)
Eosinophil %: 0.5 %
HCT: 32.6 % — ABNORMAL LOW (ref 40.0–52.0)
HGB: 10.6 g/dL — ABNORMAL LOW (ref 13.0–18.0)
Lymphocyte #: 0.9 x10 3/mm — ABNORMAL LOW (ref 1.0–3.6)
Lymphocyte %: 10.4 %
MCH: 30.9 pg (ref 26.0–34.0)
MCHC: 32.6 g/dL (ref 32.0–36.0)
MCV: 95 fL (ref 80–100)
MONOS PCT: 10.4 %
Monocyte #: 0.9 x10 3/mm (ref 0.2–1.0)
NEUTROS PCT: 78.3 %
Neutrophil #: 6.6 x10 3/mm — ABNORMAL HIGH (ref 1.4–6.5)
Platelet: 93 x10 3/mm — ABNORMAL LOW (ref 150–440)
RBC: 3.44 10*6/uL — ABNORMAL LOW (ref 4.40–5.90)
RDW: 19.2 % — ABNORMAL HIGH (ref 11.5–14.5)
WBC: 8.4 x10 3/mm (ref 3.8–10.6)

## 2013-12-09 LAB — BASIC METABOLIC PANEL
ANION GAP: 9 (ref 7–16)
BUN: 7 mg/dL (ref 7–18)
CALCIUM: 8.4 mg/dL — AB (ref 8.5–10.1)
CHLORIDE: 107 mmol/L (ref 98–107)
CREATININE: 1.12 mg/dL (ref 0.60–1.30)
Co2: 26 mmol/L (ref 21–32)
EGFR (African American): >60
Glucose: 149 mg/dL — ABNORMAL HIGH (ref 65–99)
Osmolality: 284 (ref 275–301)
Potassium: 4.3 mmol/L (ref 3.5–5.1)
Sodium: 142 mmol/L (ref 136–145)

## 2013-12-23 LAB — COMPREHENSIVE METABOLIC PANEL
ALT: 25 U/L
ANION GAP: 6 — AB (ref 7–16)
Albumin: 3.2 g/dL — ABNORMAL LOW (ref 3.4–5.0)
Alkaline Phosphatase: 98 U/L
BUN: 8 mg/dL (ref 7–18)
Bilirubin,Total: 0.5 mg/dL (ref 0.2–1.0)
CALCIUM: 8.7 mg/dL (ref 8.5–10.1)
CHLORIDE: 106 mmol/L (ref 98–107)
Co2: 30 mmol/L (ref 21–32)
Creatinine: 0.99 mg/dL (ref 0.60–1.30)
EGFR (African American): >60
EGFR (Non-African Amer.): >60
Glucose: 153 mg/dL — ABNORMAL HIGH (ref 65–99)
OSMOLALITY: 284 (ref 275–301)
POTASSIUM: 3.8 mmol/L (ref 3.5–5.1)
SGOT(AST): 23 U/L (ref 15–37)
Sodium: 142 mmol/L (ref 136–145)
TOTAL PROTEIN: 6.6 g/dL (ref 6.4–8.2)

## 2013-12-23 LAB — MAGNESIUM: MAGNESIUM: 1.8 mg/dL

## 2013-12-23 LAB — CBC CANCER CENTER
BASOS ABS: 0 x10 3/mm (ref 0.0–0.1)
Basophil %: 0.8 %
Eosinophil #: 0 x10 3/mm (ref 0.0–0.7)
Eosinophil %: 1 %
HCT: 30.9 % — AB (ref 40.0–52.0)
HGB: 10.1 g/dL — ABNORMAL LOW (ref 13.0–18.0)
Lymphocyte #: 0.5 x10 3/mm — ABNORMAL LOW (ref 1.0–3.6)
Lymphocyte %: 19.4 %
MCH: 30.6 pg (ref 26.0–34.0)
MCHC: 32.5 g/dL (ref 32.0–36.0)
MCV: 94 fL (ref 80–100)
MONOS PCT: 26.8 %
Monocyte #: 0.7 x10 3/mm (ref 0.2–1.0)
Neutrophil #: 1.4 x10 3/mm (ref 1.4–6.5)
Neutrophil %: 52 %
PLATELETS: 77 x10 3/mm — AB (ref 150–440)
RBC: 3.29 10*6/uL — ABNORMAL LOW (ref 4.40–5.90)
RDW: 17.7 % — AB (ref 11.5–14.5)
WBC: 2.7 x10 3/mm — AB (ref 3.8–10.6)

## 2013-12-23 LAB — LACTATE DEHYDROGENASE: LDH: 262 U/L — AB (ref 85–241)

## 2013-12-23 LAB — IRON AND TIBC
Iron Bind.Cap.(Total): 291 ug/dL (ref 250–450)
Iron Saturation: 22 %
Iron: 63 ug/dL — ABNORMAL LOW (ref 65–175)
Unbound Iron-Bind.Cap.: 228 ug/dL

## 2013-12-23 LAB — FERRITIN: Ferritin (ARMC): 85 ng/mL (ref 8–388)

## 2013-12-23 LAB — FOLATE: FOLIC ACID: 65.7 ng/mL (ref 3.1–100.0)

## 2014-01-05 ENCOUNTER — Ambulatory Visit: Payer: Self-pay | Admitting: Hematology and Oncology

## 2014-01-06 LAB — CBC CANCER CENTER
BASOS ABS: 0 x10 3/mm (ref 0.0–0.1)
Basophil %: 0.7 %
Eosinophil #: 0 x10 3/mm (ref 0.0–0.7)
Eosinophil %: 0.5 %
HCT: 32.3 % — ABNORMAL LOW (ref 40.0–52.0)
HGB: 10.7 g/dL — ABNORMAL LOW (ref 13.0–18.0)
LYMPHS ABS: 0.9 x10 3/mm — AB (ref 1.0–3.6)
LYMPHS PCT: 17 %
MCH: 31.4 pg (ref 26.0–34.0)
MCHC: 33 g/dL (ref 32.0–36.0)
MCV: 95 fL (ref 80–100)
Monocyte #: 0.6 x10 3/mm (ref 0.2–1.0)
Monocyte %: 11.4 %
NEUTROS PCT: 70.4 %
Neutrophil #: 3.6 x10 3/mm (ref 1.4–6.5)
Platelet: 104 x10 3/mm — ABNORMAL LOW (ref 150–440)
RBC: 3.4 10*6/uL — ABNORMAL LOW (ref 4.40–5.90)
RDW: 17.9 % — ABNORMAL HIGH (ref 11.5–14.5)
WBC: 5 x10 3/mm (ref 3.8–10.6)

## 2014-01-06 LAB — HEPATIC FUNCTION PANEL A (ARMC)
Albumin: 3.1 g/dL — ABNORMAL LOW (ref 3.4–5.0)
Alkaline Phosphatase: 116 U/L
Bilirubin, Direct: 0.1 mg/dL (ref 0.0–0.2)
Bilirubin,Total: 0.4 mg/dL (ref 0.2–1.0)
SGOT(AST): 23 U/L (ref 15–37)
SGPT (ALT): 21 U/L
Total Protein: 6.9 g/dL (ref 6.4–8.2)

## 2014-01-06 LAB — BASIC METABOLIC PANEL
Anion Gap: 8 (ref 7–16)
BUN: 7 mg/dL (ref 7–18)
CHLORIDE: 107 mmol/L (ref 98–107)
CREATININE: 0.97 mg/dL (ref 0.60–1.30)
Calcium, Total: 8.6 mg/dL (ref 8.5–10.1)
Co2: 29 mmol/L (ref 21–32)
EGFR (African American): >60
GLUCOSE: 126 mg/dL — AB (ref 65–99)
Osmolality: 286 (ref 275–301)
Potassium: 3.5 mmol/L (ref 3.5–5.1)
SODIUM: 144 mmol/L (ref 136–145)

## 2014-01-06 LAB — IRON AND TIBC
IRON SATURATION: 22 %
IRON: 65 ug/dL (ref 65–175)
Iron Bind.Cap.(Total): 289 ug/dL (ref 250–450)
UNBOUND IRON-BIND. CAP.: 224 ug/dL

## 2014-01-06 LAB — MAGNESIUM: Magnesium: 1.8 mg/dL

## 2014-01-07 LAB — CEA: CEA: 1.8 ng/mL (ref 0.0–4.7)

## 2014-01-13 LAB — CBC CANCER CENTER
BASOS ABS: 0 x10 3/mm (ref 0.0–0.1)
Basophil %: 0.4 %
EOS PCT: 0.3 %
Eosinophil #: 0 x10 3/mm (ref 0.0–0.7)
HCT: 32.3 % — ABNORMAL LOW (ref 40.0–52.0)
HGB: 10.4 g/dL — ABNORMAL LOW (ref 13.0–18.0)
Lymphocyte #: 0.6 x10 3/mm — ABNORMAL LOW (ref 1.0–3.6)
Lymphocyte %: 6.3 %
MCH: 30.8 pg (ref 26.0–34.0)
MCHC: 32.3 g/dL (ref 32.0–36.0)
MCV: 96 fL (ref 80–100)
Monocyte #: 0.9 x10 3/mm (ref 0.2–1.0)
Monocyte %: 10.4 %
Neutrophil #: 7.5 x10 3/mm — ABNORMAL HIGH (ref 1.4–6.5)
Neutrophil %: 82.6 %
Platelet: 79 x10 3/mm — ABNORMAL LOW (ref 150–440)
RBC: 3.38 10*6/uL — AB (ref 4.40–5.90)
RDW: 17.3 % — AB (ref 11.5–14.5)
WBC: 9.1 x10 3/mm (ref 3.8–10.6)

## 2014-02-05 ENCOUNTER — Ambulatory Visit: Payer: Self-pay | Admitting: Hematology and Oncology

## 2014-03-08 ENCOUNTER — Ambulatory Visit: Payer: Self-pay | Admitting: Hematology and Oncology

## 2014-04-06 ENCOUNTER — Ambulatory Visit
Admit: 2014-04-06 | Disposition: A | Discharge status: Home / Self Care | Payer: Self-pay | Attending: Hematology and Oncology | Admitting: Hematology and Oncology

## 2014-04-12 ENCOUNTER — Ambulatory Visit: Payer: Self-pay | Admitting: Gastroenterology

## 2014-05-12 ENCOUNTER — Ambulatory Visit
Admit: 2014-05-12 | Disposition: A | Discharge status: Home / Self Care | Payer: Self-pay | Attending: Hematology and Oncology | Admitting: Hematology and Oncology

## 2014-05-18 ENCOUNTER — Emergency Department
Admit: 2014-05-18 | Disposition: A | Discharge status: Home / Self Care | Payer: Self-pay | Admitting: Emergency Medicine

## 2014-05-18 LAB — BASIC METABOLIC PANEL
ANION GAP: 6 — AB (ref 7–16)
BUN: 16 mg/dL
CREATININE: 1.24 mg/dL
Calcium, Total: 8.8 mg/dL — ABNORMAL LOW
Chloride: 111 mmol/L
Co2: 26 mmol/L
EGFR (African American): >60
GFR CALC NON AF AMER: 60 — AB
GLUCOSE: 153 mg/dL — AB
POTASSIUM: 3.9 mmol/L
SODIUM: 143 mmol/L

## 2014-05-18 LAB — HEPATIC FUNCTION PANEL A (ARMC)
ALBUMIN: 3.3 g/dL — AB
ALK PHOS: 111 U/L
BILIRUBIN DIRECT: 0.2 mg/dL
BILIRUBIN TOTAL: 0.8 mg/dL
Indirect Bilirubin: 0.6
SGOT(AST): 57 U/L — ABNORMAL HIGH
SGPT (ALT): 50 U/L
Total Protein: 6.5 g/dL

## 2014-05-18 LAB — CBC
HCT: 35.6 % — ABNORMAL LOW (ref 40.0–52.0)
HGB: 11 g/dL — AB (ref 13.0–18.0)
MCH: 27.7 pg (ref 26.0–34.0)
MCHC: 30.8 g/dL — ABNORMAL LOW (ref 32.0–36.0)
MCV: 90 fL (ref 80–100)
PLATELETS: 149 10*3/uL — AB (ref 150–440)
RBC: 3.96 10*6/uL — AB (ref 4.40–5.90)
RDW: 17.1 % — AB (ref 11.5–14.5)
WBC: 3.1 10*3/uL — ABNORMAL LOW (ref 3.8–10.6)

## 2014-05-18 LAB — TROPONIN I: Troponin-I: 0.03 ng/mL

## 2014-05-18 LAB — PRO B NATRIURETIC PEPTIDE: B-TYPE NATIURETIC PEPTID: 1201 pg/mL — AB

## 2014-05-27 LAB — COMPREHENSIVE METABOLIC PANEL
Albumin: 3.5 g/dL
Alkaline Phosphatase: 108 U/L
Anion Gap: 5 — ABNORMAL LOW (ref 7–16)
BUN: 16 mg/dL
Bilirubin,Total: 1.1 mg/dL
Calcium, Total: 8.5 mg/dL — ABNORMAL LOW
Chloride: 110 mmol/L
Co2: 25 mmol/L
Creatinine: 1.19 mg/dL
EGFR (African American): >60
EGFR (Non-African Amer.): >60
Glucose: 109 mg/dL — ABNORMAL HIGH
Potassium: 3.9 mmol/L
SGOT(AST): 27 U/L
SGPT (ALT): 39 U/L
Sodium: 140 mmol/L
Total Protein: 6.9 g/dL

## 2014-05-27 LAB — CBC CANCER CENTER
Basophil #: 0 x10 3/mm (ref 0.0–0.1)
Basophil %: 0.7 %
Eosinophil #: 0 x10 3/mm (ref 0.0–0.7)
Eosinophil %: 1 %
HCT: 34.6 % — ABNORMAL LOW (ref 40.0–52.0)
HGB: 11.1 g/dL — ABNORMAL LOW (ref 13.0–18.0)
Lymphocyte #: 0.5 x10 3/mm — ABNORMAL LOW (ref 1.0–3.6)
Lymphocyte %: 14.8 %
MCH: 28.3 pg (ref 26.0–34.0)
MCHC: 31.9 g/dL — ABNORMAL LOW (ref 32.0–36.0)
MCV: 89 fL (ref 80–100)
Monocyte #: 0.4 x10 3/mm (ref 0.2–1.0)
Monocyte %: 10.9 %
Neutrophil #: 2.5 x10 3/mm (ref 1.4–6.5)
Neutrophil %: 72.6 %
Platelet: 155 x10 3/mm (ref 150–440)
RBC: 3.9 10*6/uL — ABNORMAL LOW (ref 4.40–5.90)
RDW: 16.8 % — ABNORMAL HIGH (ref 11.5–14.5)
WBC: 3.4 x10 3/mm — ABNORMAL LOW (ref 3.8–10.6)

## 2014-05-28 LAB — CEA: CEA: 1.3 ng/mL (ref 0.0–4.7)

## 2014-05-29 NOTE — Consult Note (Signed)
Brief Consult Note: Diagnosis: highly likely descending-sigmoid colon CA.   Patient was seen by consultant.   Consult note dictated.   Recommend to proceed with surgery or procedure.   Orders entered.   Comments: I have convicned him to have surgery during this admission, but would like to tenatively plan for hand-assisted laparoscopic splenic flexure mobilization and possible open left colectomy Tuesday, after his pathology is back, and his cardiac status has been maximized and his anemia has been stabilized. I ordered a CEA, and regular diet, and will repeat his bowel prep Monday and transfer him to my service postop Tuesday.  Electronic Signatures: Consuela Mimes (MD)  (Signed 15-May-15 17:44)  Authored: Brief Consult Note   Last Updated: 15-May-15 17:44 by Consuela Mimes (MD)

## 2014-05-29 NOTE — Consult Note (Signed)
Chief Complaint:  Subjective/Chief Complaint DID NOT INTERVIEW   VITAL SIGNS/ANCILLARY NOTES: **Vital Signs.:   14-May-15 19:36  Vital Signs Type Routine  Temperature Temperature (F) 97.7  Celsius 36.5  Temperature Source axillary  Pulse Pulse 55  Respirations Respirations 20  Systolic BP Systolic BP 841  Diastolic BP (mmHg) Diastolic BP (mmHg) 69  Mean BP 94  Pulse Ox % Pulse Ox % 100  Pulse Ox Activity Level  At rest  Oxygen Delivery Room Air/ 21 %   (Removed):     Lab Results:  LabObservation:  14-May-15 11:37   OBSERVATION Reason for Test  Cardiology:  14-May-15 11:37   Echo Doppler REASON FOR EXAM:     COMMENTS:     PROCEDURE: [facility] - ECHO DOPPLER COMPLETE(TRANSTHOR)  - Jun 18 2013 11:37AM   RESULT: Echocardiogram Report  Patient Name:   [name] Date of Exam: 06/18/2013 Medical Rec #:  [id number]         Custom1: Date of Birth:  14-Mar-1945     Height:       68.9 in Patient Age:    69 years       Weight:       149.9 lb Patient Gender: M              BSA:          1.83 m??  Indications: SOB Sonographer:    Sherrie Sport RDCS Referring Phys: Max Sane, S  Summary:  1. Left ventricular ejection fraction, by visual estimation, is 40 to  45%.  2. Mildly decreased global left ventricular systolic function. No wall  motion abnormalities.  3. Pseudonormal pattern of LV diastolic filling.  4. Mild to moderately increased left ventricular internal cavity size.  5. Mildly dilated left atrium.  6. Mildly dilated right atrium.  7. Moderate mitral valve regurgitation.  8. Mild aortic valve sclerosis without stenosis.  9. Mild tricuspid regurgitation. 2D AND M-MODE MEASUREMENTS (normal ranges within parentheses): Left Ventricle:          Normal IVSd (2D):      1.50 cm (0.7-1.1) LVPWd (2D):     1.16 cm (0.7-1.1) Aorta/LA:                  Normal LVIDd (2D):     5.71 cm (3.4-5.7) Aortic Root (2D): 3.20 cm (2.4-3.7) LVIDs (2D):     4.29 cm           Left Atrium (2D):  4.90 cm (1.9-4.0) LV FS (2D):     24.9 %   (>25%) LV EF (2D):     48.6 %   (>50%)                                   Right Ventricle:                                   RVd (2D):      0.27 cm LV SYSTOLIC FUNCTION BY 2D PLANIMETRY (MOD): EF-A4C View: 49.4 % EF-A2C View: 51.4 % EF-Biplane: 25.3 % LV DIASTOLIC FUNCTION: MV Peak E: 0.86 m/s E/e' Ratio: 10.90 MV Peak A: 0.48 m/s Decel Time: 229 msec E/A Ratio: 1.79 SPECTRAL DOPPLER ANALYSIS (where applicable): Mitral Valve: MV P1/2 Time: 66.41 msec MV Area, PHT: 3.31 cm?? Aortic Valve: AoV Max Vel: 1.48 m/s AoV Peak  PG: 8.7 mmHg AoV Mean PG:  5.0 mmHg LVOT Vmax: 0.64 m/s LVOT VTI: 0.157 m LVOT Diameter: 2.10 cm AoV Area, Vmax: 1.51 cm?? AoV Area, VTI: 1.53 cm?? AoV Area, Vmn: 1.64 cm?? Tricuspid Valve and PA/RV Systolic Pressure: TR Max Velocity: 2.90 m/s RA  Pressure: 5 mmHg RVSP/PASP: 38.5 mmHg Pulmonic Valve: PV Max Velocity: 0.84 m/s PV Max PG: 2.8 mmHg PV Mean PG:  PHYSICIAN INTERPRETATION: Left Ventricle: The left ventricular internal cavity size was mild to   moderately increased. Global LV systolic function was mildly decreased.  Left ventricular ejection fraction, by visual estimation, is 40 to 45%.  Spectral Doppler shows pseudonormal pattern of LV diastolic filling. Right Ventricle: Normal right ventricular size, wall thickness, and  systolic function. Left Atrium: The left atrium is mildly dilated. Right Atrium: The right atrium is mildly dilated. Pericardium: There is no evidence of pericardial effusion. Mitral Valve: The mitral valve is normal in structure. No evidence of  mitral valve stenosis. Moderate mitral valve regurgitation is seen. Tricuspid Valve: The tricuspid valve is normal. Mild tricuspid  regurgitation is visualized. The tricuspid regurgitant velocity is 2.90  m/s, and with an assumed right atrial pressure of 5 mmHg, the estimated  right ventricular systolic pressure is normal at 38.5 mmHg. Aortic Valve:  The aortic valve is tricuspid. Mild aortic valve sclerosis   is present, with no evidence of aortic valve stenosis. No evidence of  aortic valve regurgitation is seen. Pulmonic Valve: The pulmonic valve is not well seen. Trace pulmonic valve  regurgitation. Aorta: The aortic root is normal in size and structure. Venous: The inferior vena cava was not well visualized.  [id number] Kathlyn Sacramento MD Electronically signed by [id number] Kathlyn Sacramento MD Signature Date/Time: 06/18/2013/1:08:08PM  *** Final ***  IMPRESSION: .  Verified By: Mertie Clause. ARIDA, M.D., MD  Routine Coag:  14-May-15 04:25   Prothrombin  15.9  INR 1.3 (INR reference interval applies to patients on anticoagulant therapy. A single INR therapeutic range for coumarins is not optimal for all indications; however, the suggested range for most indications is 2.0 - 3.0. Exceptions to the INR Reference Range may include: Prosthetic heart valves, acute myocardial infarction, prevention of myocardial infarction, and combinations of aspirin and anticoagulant. The need for a higher or lower target INR must be assessed individually. Reference: The Pharmacology and Management of the Vitamin K  antagonists: the seventh ACCP Conference on Antithrombotic and Thrombolytic Therapy. BOFBP.1025 Sept:126 (3suppl): [id number]. A HCT value >55% may artifactually increase the PT.  In one study,  the increase was an average of 25%. Reference:  "Effect on Routine and Special Coagulation Testing Values of Citrate Anticoagulant Adjustment in Patients with High HCT Values." American Journal of Clinical Pathology 2006;126:400-405.)  Activated PTT (APTT) 32.6 (A HCT value >55% may artifactually increase the APTT. In one study, the increase was an average of 19%. Reference: "Effect on Routine and Special Coagulation Testing Values of Citrate Anticoagulant Adjustment in Patients with High HCT Values." American Journal of Clinical Pathology  2006;126:400-405.)  Routine Hem:  14-May-15 04:25   Hemoglobin (CBC)  9.0  Hematocrit (CBC)  28.8  WBC (CBC) 9.1  RBC (CBC)  3.82  Platelet Count (CBC) 157  MCV  75  MCH  23.4  MCHC  31.1  RDW  25.0  Neutrophil % 78.8  Lymphocyte % 7.6  Monocyte % 12.0  Eosinophil % 1.0  Basophil % 0.6  Neutrophil #  7.2  Lymphocyte #  0.7  Monocyte #  1.1  Eosinophil # 0.1  Basophil # 0.1 (Result(s) reported on 18 Jun 2013 at 05:00AM.)    12:22   Hemoglobin (CBC)  10.1 (Result(s) reported on 18 Jun 2013 at 01:12PM.)  Hematocrit (CBC)  33.2 (Result(s) reported on 18 Jun 2013 at 01:12PM.)   Radiology Results:  Cardiology:    14-May-15 11:37, Echo Doppler  Echo Doppler   REASON FOR EXAM:      COMMENTS:       PROCEDURE: [facility] - ECHO DOPPLER COMPLETE(TRANSTHOR)  - Jun 18 2013 11:37AM     RESULT: Echocardiogram Report    Patient Name:   [name] Date of Exam: 06/18/2013  Medical Rec #:  [id number]         Custom1:  Date of Birth:  Aug 06, 1945     Height:       68.9 in  Patient Age:    50 years       Weight:       149.9 lb  Patient Gender: M              BSA:          1.83 m??    Indications: SOB  Sonographer:    Sherrie Sport RDCS  Referring Phys: Max Sane, S    Summary:   1. Left ventricular ejection fraction, by visual estimation, is 40 to   45%.   2. Mildly decreased global left ventricular systolic function. No wall   motion abnormalities.   3. Pseudonormal pattern of LV diastolic filling.   4. Mild to moderately increased left ventricular internal cavity size.   5. Mildly dilated left atrium.   6. Mildly dilated right atrium.   7. Moderate mitral valve regurgitation.   8. Mild aortic valve sclerosis without stenosis.   9. Mild tricuspid regurgitation.  2D AND M-MODE MEASUREMENTS (normal ranges within parentheses):  Left Ventricle:          Normal  IVSd (2D):      1.50 cm (0.7-1.1)  LVPWd (2D):     1.16 cm (0.7-1.1) Aorta/LA:                  Normal  LVIDd (2D):     5.71 cm  (3.4-5.7) Aortic Root (2D): 3.20 cm (2.4-3.7)  LVIDs (2D):     4.29 cm           Left Atrium (2D): 4.90 cm (1.9-4.0)  LV FS (2D):     24.9 %   (>25%)  LV EF (2D):     48.6 %   (>50%)                                    Right Ventricle:                                    RVd (2D):      3.32 cm  LV SYSTOLIC FUNCTION BY 2D PLANIMETRY (MOD):  EF-A4C View: 49.4 % EF-A2C View: 51.4 % EF-Biplane: 95.1 %  LV DIASTOLIC FUNCTION:  MV Peak E: 0.86 m/s E/e' Ratio: 10.90  MV Peak A: 0.48 m/s Decel Time: 229 msec  E/A Ratio: 1.79  SPECTRAL DOPPLER ANALYSIS (where applicable):  Mitral Valve:  MV P1/2 Time: 66.41 msec  MV Area, PHT: 3.31 cm??  Aortic Valve: AoV Max Vel: 1.48 m/s AoV Peak PG:  8.7 mmHg AoV Mean PG:   5.0 mmHg  LVOT Vmax: 0.64 m/s LVOT VTI: 0.157 m LVOT Diameter: 2.10 cm  AoV Area, Vmax: 1.51 cm?? AoV Area, VTI: 1.53 cm?? AoV Area, Vmn: 1.64 cm??  Tricuspid Valve and PA/RV Systolic Pressure: TR Max Velocity: 2.90 m/s RA   Pressure: 5 mmHg RVSP/PASP: 38.5 mmHg  Pulmonic Valve:  PV Max Velocity: 0.84 m/s PV Max PG: 2.8 mmHg PV Mean PG:    PHYSICIAN INTERPRETATION:  Left Ventricle: The left ventricular internal cavity size was mild to     moderately increased. Global LV systolic function was mildly decreased.   Left ventricular ejection fraction, by visual estimation, is 40 to 45%.   Spectral Doppler shows pseudonormal pattern of LV diastolic filling.  Right Ventricle: Normal right ventricular size, wall thickness, and   systolic function.  Left Atrium: The left atrium is mildly dilated.  Right Atrium: The right atrium is mildly dilated.  Pericardium: There is no evidence of pericardial effusion.  Mitral Valve: The mitral valve is normal in structure. No evidence of   mitral valve stenosis. Moderate mitral valve regurgitation is seen.  Tricuspid Valve: The tricuspid valve is normal. Mild tricuspid   regurgitation is visualized. The tricuspid regurgitant velocity is 2.90   m/s, and  with an assumed right atrial pressure of 5 mmHg, the estimated   right ventricular systolic pressure is normal at 38.5 mmHg.  Aortic Valve: The aortic valve is tricuspid. Mild aortic valve sclerosis     is present, with no evidence of aortic valve stenosis. No evidence of   aortic valve regurgitation is seen.  Pulmonic Valve: The pulmonic valve is not well seen. Trace pulmonic valve   regurgitation.  Aorta: The aortic root is normal in size and structure.  Venous: The inferior vena cava was not well visualized.    [id number] Kathlyn Sacramento MD  Electronically signed by [id number] Kathlyn Sacramento MD  Signature Date/Time: 06/18/2013/1:08:08PM    *** Final ***    IMPRESSION: .    Verified By: Mertie Clause. Fletcher Anon, M.D., MD   Assessment/Plan:  Assessment/Plan:  Assessment SEE ALSO 5/13 CONSULT.    LOW MCV, LOW RETIC, LOW FERRITIN ANEMIA ALL CONSISTENT WITH IDA, LIKELY FROM SUBACUTE AND CHRONIC BLOOD LOSS.  MINIMALLY HIGH BILI, WILL REPEAT, LFTS WERE OK. MILD AZOTEMIA. ELEVATED PROTIME MAY BE REAL BASELINE OR  ANEMIA/TRANSFUSION EFFECT   Plan F/U CBC.  IF REMAINS ANEMIA AFTER IRON REPLACEMENT, NEEDS HEME F/U FOR EITHER ADDITIONAL ANEMIA W/U OR IV IRON. WOULD RECHECK PROTIME AND IF STILL ELEVATED DO MIX STUDY, LIKELY OCCULT LIVER DISEASE OR NUTRITIONAL EFFECT, PRIMARILY FACTOR VII DEPLETION.   Electronic Signatures: Dallas Schimke (MD)  (Signed 15-May-15 09:33)  Authored: Chief Complaint, VITAL SIGNS/ANCILLARY NOTES, Brief Assessment, Lab Results, Radiology Results, Assessment/Plan   Last Updated: 15-May-15 09:33 by Dallas Schimke (MD)

## 2014-05-29 NOTE — Consult Note (Signed)
Pt doing ok. Surgery saw patient yest. Plan for colon surgery on Tues. CT showed 4cm colon mass. No obvious evidence of metastasis. Continue to treat esophagitis and gasritis with daily PPI. Will sign off. Thanks.  Electronic Signatures: Verdie Shire (MD)  (Signed on 16-May-15 09:52)  Authored  Last Updated: 16-May-15 09:52 by Verdie Shire (MD)

## 2014-05-29 NOTE — Consult Note (Signed)
Brief Consult Note: Diagnosis: severe microcytic anemia.   Patient was seen by consultant.   Consult note dictated.   Recommend further assessment or treatment.   Comments: 1.) severe microcytic anemia:  iron studies not c/w with IDA but these appear to be drawn after he received prbc.  No overt bleeding.   Recs: - EGD and colonoscopy once stable from CHF standpoint ( may be able to be done as outpt)  - cont to transfuse to Hgb > 7 - do not start PO iron yet - monitor Hgb - clear liquids.  Electronic Signatures: Arther Dames (MD)  (Signed 13-May-15 12:59)  Authored: Brief Consult Note   Last Updated: 13-May-15 12:59 by Arther Dames (MD)

## 2014-05-29 NOTE — H&P (Signed)
PATIENT NAME:  Christopher [name], Christopher [name] MR#:  [id number] DATE OF BIRTH:  1945/09/24  DATE OF ADMISSION:  06/16/2013  PRIMARY CARE PHYSICIAN:  Dr. Burnard Bunting  REQUESTING PHYSICIAN:  Dr. Lenise Arena   CHIEF COMPLAINT: Shortness of breath and weakness.   HISTORY OF PRESENT ILLNESS: The patient is a 69 year old male with a known history of hypertension.  He is being admitted for [name] symptomatic anemia with possible CHF. The patient has been noticing increasing lower extremity edema for the last 1-2 weeks. He is also getting more and more dyspneic, mainly on exertion, and has also been feeling weak. Went to see urgent care [facility]) where he was found to be anemic and was requested to come to the Emergency Department. While in the ED, he was noted to have hemoglobin of 4.2. He had a Hemoccult positive stool and is being admitted for further evaluation and management.   PAST MEDICAL HISTORY: Hypertension.   MEDICATIONS AT HOME:  Bisoprolol/HCTZ 1 tablet p.o. twice a day. He did not know exact dosage.   ALLERGIES: No known drug allergies.   SOCIAL HISTORY: No smoking or alcohol.  He used to work at city [employer].   FAMILY HISTORY: Both parents had unknown cancer from which they died and he was very young.    REVIEW OF SYSTEMS:  CONSTITUTIONAL:  No fever. Positive fatigue and weakness.  EYES: No blurred or double vision.  EARS, NOSE, AND THROAT:  No tinnitus or ear pain.  RESPIRATORY: No cough, wheezing, hemoptysis. Positive for dyspnea on exertion.  CARDIOVASCULAR: No chest pain. Positive for lower extremity edema and dyspnea on exertion.  GASTROINTESTINAL: No nausea or vomiting or diarrhea. No abdominal pain.  GENITOURINARY: No dysuria or hematuria.  ENDOCRINE: No polyuria, nocturia.  HEMATOLOGY: [name] anemia. No easy bruising. He did not notice any blood coming out of his stool.  MUSCULOSKELETAL: He does have some right shoulder pain for which he has been taking a BC powder.   NEUROLOGIC: No tingling or numbness. Positive for weakness and dizziness.   PSYCHIATRIC:  No history of anxiety or depression.   PHYSICAL EXAMINATION:  VITAL SIGNS: Temperature 98.2, heart rate 53, respirations 20 per minute, blood pressure 108/47.  He is saturating 93% on room air.  GENERAL: Patient is a 69 year old male lying in the bed comfortably without any acute distress.  EYES: Scleral pallor present.  Pupils equal, round, reactive to light and accommodation. No scleral icterus. Extraocular muscles intact.  HEENT: Head atraumatic, normocephalic. Oropharynx and nasopharynx dry and clear.  NECK: Supple. No restrictions.  No thyroid enlargement or tenderness.    LUNGS: Clear to auscultation bilaterally. No wheezing, rales, rhonchi, or crepitation.  CARDIOVASCULAR:  S1, S2 normal. No murmurs, rubs, or gallop.  ABDOMEN: Soft, nontender, nondistended. Bowel sounds present.  No organomegaly. EXTREMITIES:  He has 2+ pedal edema. No cyanosis or clubbing.  NEUROLOGIC: Cranial nerves II-XII intact.  Muscle strength 5/5 in all extremities. Sensation intact.  SKIN: No obvious rash, lesion or ulcer.  MUSCULOSKELETAL: No joint effusion or tenderness.  PSYCHIATRIC:  Patient is alert and oriented x 3.   LABORATORY PANEL: Normal BMP, BUN of 20, creatinine 1.31.  Normal 1st set of cardiac enzymes. CBC showed white count of 5.6, hemoglobin 4.2, hematocrit 15.0, platelets 179,000. PT of 15.4, INR 1.2. BNP of 4287, blood sugar of 92.   DIAGNOSTIC DATA: Chest x-ray in the ED showed no acute cardiopulmonary disease. EKG shows sinus bradycardia with some PACs, LVH by  voltage criteria. No  major ST-T changes.   IMPRESSION AND PLAN:  1. [name] symptomatic anemia, likely from GI bleed, possibly upper, as he has been using a lot of BC powder. We will do type and cross. He is ordered 10 units of packed red blood cells by ED physician and has already gotten consent per patient by the ED physician. We will consult GI  as he will likely need endoscopy.  We will start him on IV Protonix twice a day. He is not having a gross bleeding at this time. We will hold off Protonix drip.  We will avoid any non-steroidals at this time as that is likely the contributing factor. 2. Hypertension with a history of hypertension likely due to gastrointestinal bleed and [name] anemia. We will hold his blood pressure medicine and hydrate him with IV fluids gently.  3. Congestive heart failure with lower extremity edema, elevated BNP, and dyspnea on exertion. We will obtain 2-D echocardiogram and consult cardiology. Consider starting diuretics and ACE inhibitor as appropriate after echo results.  4. Gastrointestinal bleed with a positive Hemoccult stool.  We will start him on IV PPI twice a day. Consult GI.   CODE STATUS: Full code.   TOTAL TIME TAKING CARE OF THIS PATIENT: 55 minutes.   ____________________________ Lucina Mellow. Manuella Ghazi, MD vss:dd D: 06/16/2013 18:33:00 ET T: 06/16/2013 19:00:04 ET JOB#: [id number]  cc: Christopher Baggerly S. Manuella Ghazi, MD, <Dictator> Christopher [name]. Christopher Ek, MD  Lucina Mellow [facility] MD ELECTRONICALLY SIGNED 06/30/2013 20:45

## 2014-05-29 NOTE — Op Note (Signed)
PATIENT NAME:  [name], [name] MR#:  [id number] DATE OF BIRTH:  Apr 02, 1945  DATE OF PROCEDURE:  07/20/2013  PREOPERATIVE DIAGNOSIS: Colon cancer.   POSTOPERATIVE DIAGNOSIS: Colon cancer.   PROCEDURE: Left subclavian vein Port-A-Cath placement.   SURGEON: Consuela Mimes, M.D.   ANESTHESIA: Local with MAC.   PROCEDURE IN DETAIL: The patient was placed supine on the operating room table and prepped and draped in the usual sterile fashion. A left subclavian vein Port-A-Cath was constructed to 24.5 cm and placed into the left subclavian vein with the double Seldinger technique, and the port was secured into a subcutaneous pocket beneath the stick site incision with a 3-0 Monocryl suture. The port flushed with heparinized saline easily and withdrew blood easily, and the procedure was performed with fluoroscopy guidance. Then 3-0 Monocryls were used to close the sub-dermis and the skin was reapproximated with a running subcuticular 5-0 Monocryl and suture strip. The patient tolerated the procedure well. There were no complications.   ____________________________ Consuela Mimes, MD wfm:sb D: 07/20/2013 08:16:48 ET T: 07/20/2013 08:40:56 ET JOB#: [id number]  cc: Consuela Mimes, MD, <Dictator> Consuela Mimes MD ELECTRONICALLY SIGNED 07/20/2013 21:13

## 2014-05-29 NOTE — Discharge Summary (Signed)
PATIENT NAME:  [name], [name] MR#:  [id number] DATE OF BIRTH:  1945-12-02  DATE OF ADMISSION:  06/16/2013 DATE OF DISCHARGE:  06/26/2013  PRINCIPAL DIAGNOSIS: Descending colon cancer: T4b N2a.   OTHER DIAGNOSES:  1. Pathologically contaminated ascending colon polyp, benign.  2. Severe anemia secondary to the principle diagnosis, and  3. Lower gastrointestinal hemorrhage.  4. Congestive heart failure.  5. Moderate mitral regurgitation.  6. Hypertension.   PRINCIPAL PROCEDURES PERFORMED DURING THIS ADMISSION: On 06/23/2013: Descending/proximal sigmoid colectomy, left ureterolysis, partial cecectomy, hand-assisted splenic flexure mobilization, incidental appendectomy.   HOSPITAL COURSE: The patient was admitted to the hospital and over the course of the first 4 or 5 days of his hospitalization, he had 5 units of packed red blood cells transfused for his initial presenting hematocrit of 15%. His congestive heart failure was found to be due to moderate mitral regurgitation and he had a BNP of 4200. He had another bowel prep and underwent the above-mentioned procedure for the above-mentioned diagnoses. Following both the colonoscopy wherein the ascending colon polyp, which was benign, that had been excised had been found to have cancer cells underneath the polyp but not at the mucosal margin. He had also undergone an EGD which showed superficial peptic ulcer disease. In the operating room I carefully looked at the entire ascending colon and there was no evidence of the polypectomy site or any suspicious areas of mass by palpation or visualization but he did have a small pericecal cyst which was a benign peritoneal cyst but I performed a partial cecectomy to prove that. He also underwent an incidental appendectomy and the tumor was invading the lateral abdominal side wall, and was extremely close to the left ureter and left testicular vessels. Therefore I removed the left testicular vessels and perform a  left ureterolysis and 22 lymph nodes were present in the specimen including a very high proximal lymph node on the inferior mesenteric artery, as I skeletonized the anterior aorta. Six of those lymph nodes were found to contain tumor and the radial margin was negative by 4 mm. Postoperatively, the patient did well and had his analgesia decreased and converted to p.o. and his diet advanced and he was discharged home on postoperative day 3.   ____________________________ Consuela Mimes, MD wfm:lt D: 07/18/2013 20:11:35 ET T: 07/19/2013 05:00:20 ET JOB#: [id number]  cc: Consuela Mimes, MD, <Dictator> Consuela Mimes MD ELECTRONICALLY SIGNED 07/19/2013 20:10

## 2014-05-29 NOTE — Consult Note (Signed)
PATIENT NAME:  [name], [name] MR#:  [id number] DATE OF BIRTH:  1945/06/04  DATE OF CONSULTATION:  06/17/2013  REFERRING PHYSICIAN:  Dr. Max Sane CONSULTING PHYSICIAN:  Arther Dames, MD  REASON FOR CONSULTATION: Severe anemia.   HISTORY OF PRESENT ILLNESS: [name] is a 69 year old male with a past medical history only notable for hypertension who presented to the Emergency Room for evaluation of severe weakness, shortness of breath with exertion. [name] did not actually notice any blood in his bowel movements or any black stools. He has also not been having diarrhea. He moves his bowels about once per day.   In the Emergency Room, he was found to be severely anemic with a hemoglobin of 4.2. He has never before been told that he has had any trouble with anemia. He denies any prior EGDs or colonoscopies.   Of note, [name] reports that he occasionally does take some BC packets. However, he reports the last one was about 2 weeks ago and he states that he does not take these every day. He does not have any upper GI symptoms such as dysphagia, heartburn, or reflux.   PAST MEDICAL HISTORY: Hypertension.   HOME MEDICATION: Bisoprolol HCTZ 1 tablet twice a day.   ALLERGIES: NKDA.   SOCIAL HISTORY: He denies to me alcohol, tobacco, or recreational drugs.   FAMILY HISTORY: He states that his father died from cancer when he was very young, but he does not know the type of cancer.   REVIEW OF SYSTEMS:  A 10-system review was conducted. It is negative except as stated in the HPI.   PHYSICAL EXAMINATION:  VITAL SIGNS: Temperature currently is 97.6. Pulse is 51, respirations are 18, blood pressure is 135/63.  Pulse oximetry is 100% on room air.  GENERAL: Alert and oriented times 4.  No acute distress. Appears stated age. HEENT: Normocephalic/atraumatic. Extraocular movements are intact. Anicteric. NECK: Soft, supple. JVP appears normal. No adenopathy. CHEST: Clear to auscultation. No wheeze  or crackle. Respirations unlabored. HEART: Regular. No murmur, rub, or gallop.  Normal S1 and S2. ABDOMEN: Soft, nontender, nondistended.  Normal active bowel sounds in all four quadrants.  No organomegaly. No masses. EXTREMITIES: No swelling, well perfused. SKIN: No rash or lesion. Skin color, texture, turgor normal. NEUROLOGICAL: Grossly intact. PSYCHIATRIC: Normal tone and affect. MUSCULOSKELETAL: No joint swelling or erythema.   LABORATORY DATA: Currently, his sodium 142, potassium 3.8, creatinine 1.29, BUN 19. His liver enzymes and albumin of 2.5, total bilirubin 1.2, alkaline phosphatase 87, AST 16, ALT 28. Cardiac enzymes are normal. His white count is 7.4, hemoglobin is 8.8, hematocrit is 28 after 4 units of packed red cells. His INR is 1.2. He did have iron studies, but these appear to be after transfusion from looking at the record. His TIBC 315, iron saturation 33, ferritin 7.   ASSESSMENT AND PLAN:  Severe anemia, microcytic: The ferritin is low, but the other iron studies are not consistent with IDA. I am concerned that these labs were drawn after he already received blood which would make them invalid. He certainly does have a profound anemia that may possibly be iron deficient. Certainly, he has not had any overt bleeding.   RECOMMENDATIONS: I would recommend an EGD and a colonoscopy. It appears as though he is still being worked up from a CHF standpoint and there are plans for an echocardiogram tomorrow.  Before going ahead with the colon prep tonight, we will hold off another day. We will have  him on clear liquids tomorrow during the day and then we will prep him Thursday evening and Friday morning in preparation for an EGD and a colonoscopy on Friday. In the meantime can continue to monitor his hemoglobin and hemodynamics. Thank you for this consult.    ____________________________ Arther Dames, MD mr:dd D: 06/17/2013 18:51:53 ET T: 06/17/2013 19:31:23  ET JOB#: [id number]  cc: Arther Dames, MD, <Dictator> Mellody Life MD ELECTRONICALLY SIGNED 06/27/2013 19:15

## 2014-05-29 NOTE — Consult Note (Signed)
General Aspect PCP: Dr. Burman Riis Cardiologist:  New to [facility] - seen by D. Ellyn Hack, MD   69 y/o male w/o prior cardiac hx who presented to the ED yesterday with a 1 wk h/o progressive dyspnea and LEE and was found to be profoundly anemic.  We've been asked to eval re: ? CHF.   Present Illness 69 y/o male with a h/o HTN, managed by his PCP.  He previously drank heavily but says that that was many years ago.  He is retired from the city of [employer].  He is generally very active @ home however about 1 wk ago, he was mowing his yard and after just a few mins he felt very fatigued and dyspneic.  This progressed over the course of the last week associated with marked reduction in exercise tolerance.  In that setting he was sitting around a lot more often than usual and began to experience bilat LEE.  He denies chest pain, pnd, orthopnea, n, v, dizziness, syncope, or early satiety.  He also denies any significant change in appetite or PO intake, dark stools, or blood in his stools.  He does eat fast food frequently and generaly does not watch his salt intake. Due to progressive Ss, he presented to Urgent Care yesterday and was found to be anemic and was referred to the ER.  There, his Hgb was found to be 4.2.  Stool was guaiac +.  He was admitted for further evaluation with GI consult pending.  He continues to received PRBC's and Hgb/Hct has risen to 6.3/20.8 this AM.  We have been asked to eval due to concern for possible CHF in thhe setting of dyspnea, lower ext edema, and elevated BNP of 4287.  Despite profound anemia, trop is nl.  Pt is currently w/o complaints.  LEE has resolved (received lasix in-between PRBC's overnight).  Albumin 2.5.   Physical Exam:  GEN pleasant, nad.   HEENT pink conjunctivae, moist oral mucosa, poor dentition   NECK supple  No masses  no bruits/jvd.   RESP normal resp effort  bibasilar crackles.   CARD Regular rate and rhythm  Normal, S1, S2  No murmur    ABD denies tenderness  soft  normal BS   LYMPH negative neck   EXTR negative cyanosis/clubbing, negative edema   SKIN normal to palpation   NEURO follows commands, motor/sensory function intact, grossly intact, nonfocal.   PSYCH alert, A+O to time, place, person   Review of Systems:  General: Fatigue  reduced exerice tolerance.   Skin: No Complaints   ENT: No Complaints   Eyes: No Complaints   Neck: No Complaints   Respiratory: Short of breath   Cardiovascular: Dyspnea   Gastrointestinal: specifically denies dark stools/brbpr.   Genitourinary: No Complaints   Vascular: No Complaints   Musculoskeletal: No Complaints   Neurologic: No Complaints   Hematologic: No Complaints   Endocrine: No Complaints   Psychiatric: No Complaints   Review of Systems: All other systems were reviewed and found to be negative   Medications/Allergies Reviewed Medications/Allergies reviewed   Family & Social History:  Family and Social History:  Family History Mother died of cancer when pt was only 108 mos old.  He doesn't know what his father died of.   Social History negative tobacco, negative Illicit drugs, Previously drank heavily but says that was many, many yrs ago.   Place of Living Home  Lives in [place].  Retired from [employer].  HTN:   Lab Results:  Hepatic:  13-May-15 04:16   Bilirubin, Total  1.2  Alkaline Phosphatase 87 (45-117 NOTE: New Reference Range 12/26/12)  SGPT (ALT) 28  SGOT (AST) 16  Total Protein, Serum  6.2  Albumin, Serum  2.5  Routine BB:  12-May-15 17:45   Crossmatch Unit 1 Issued  Crossmatch Unit 2 Issued (Result(s) reported on 17 Jun 2013 at 09:01AM.)  ABO Group + Rh Type B Positive  Antibody Screen NEGATIVE (Result(s) reported on 16 Jun 2013 at 07:18PM.)    20:12   Crossmatch Unit 1 Transfused  Crossmatch Unit 2 Transfused  Result(s) reported on 17 Jun 2013 at 07:26AM.  Routine Chem:  12-May-15 16:54   Glucose,  Serum 92  BUN  20  Creatinine (comp)  1.31  Sodium, Serum 141  Potassium, Serum 3.7  Chloride, Serum 107  CO2, Serum 26  Calcium (Total), Serum 8.7  Osmolality (calc) 284  eGFR (African American) >60  eGFR (Non-African American)  56 (eGFR values <52m/min/1.73 m2 may be an indication of chronic kidney disease (CKD). Calculated eGFR is useful in patients with stable renal function. The eGFR calculation will not be reliable in acutely ill patients when serum creatinine is changing rapidly. It is not useful in  patients on dialysis. The eGFR calculation may not be applicable to patients at the low and high extremes of body sizes, pregnant women, and vegetarians.)  Anion Gap 8  Result Comment Hemoglobin/Hematocrit - RESULTS VERIFIED BY REPEAT TESTING.  - Critical value called to and read back  - by JClaretta Fraisein ER @ 1720  - 06/16/13 BGB.  Result(s) reported on 16 Jun 2013 at 05:22PM.  B-Type Natriuretic Peptide ([facility]  [phone](Result(s) reported on 16 Jun 2013 at 05:30PM.)  13-May-15 02:47   Result Comment HEMOGLOBIN - DUPLICATE ORDER CBC ORDERED  - SEE ACCESSION# [id number] Result(s) reported on 17 Jun 2013 at 04:36AM.    04:16   Glucose, Serum 92  BUN  19  Creatinine (comp) 1.29  Sodium, Serum 142  Potassium, Serum 3.8  Chloride, Serum 107  CO2, Serum 27  Calcium (Total), Serum  8.3  Osmolality (calc) 285  eGFR (African American) >60  eGFR (Non-African American)  57 (eGFR values <631mmin/1.73 m2 may be an indication of chronic kidney disease (CKD). Calculated eGFR is useful in patients with stable renal function. The eGFR calculation will not be reliable in acutely ill patients when serum creatinine is changing rapidly. It is not useful in  patients on dialysis. The eGFR calculation may not be applicable to patients at the low and high extremes of body sizes, pregnant women, and vegetarians.)  Anion Gap 8  Cardiac:  12-May-15 16:54   CK, Total 75 (39-308 NOTE:  NEW REFERENCE RANGE  03/09/2013)  CPK-MB, Serum 1.2 (Result(s) reported on 16 Jun 2013 at 06[facility]  Troponin I < 0.02 (0.00-0.05 0.05 ng/mL or less: NEGATIVE  Repeat testing in 3-6 hrs  if clinically indicated. >0.05 ng/mL: POTENTIAL  MYOCARDIAL INJURY. Repeat  testing in 3-6 hrs if  clinically indicated. NOTE: An increase or decrease  of 30% or more on serial  testing suggests a  clinically important change)  Routine Coag:  12-May-15 16:54   Prothrombin  15.4  INR 1.2 (INR reference interval applies to patients on anticoagulant therapy. A single INR therapeutic range for coumarins is not optimal for all indications; however, the suggested range for most indications is 2.0 - 3.0. Exceptions to the INR Reference Range may include:  Prosthetic heart valves, acute myocardial infarction, prevention of myocardial infarction, and combinations of aspirin and anticoagulant. The need for a higher or lower target INR must be assessed individually. Reference: The Pharmacology and Management of the Vitamin K  antagonists: the seventh ACCP Conference on Antithrombotic and Thrombolytic Therapy. FUXNA.3557 Sept:126 (3suppl): N9146842. A HCT value >55% may artifactually increase the PT.  In one study,  the increase was an average of 25%. Reference:  "Effect on Routine and Special Coagulation Testing Values of Citrate Anticoagulant Adjustment in Patients with High HCT Values." American Journal of Clinical Pathology 2006;126:400-405.)  13-May-15 04:16   Prothrombin  15.4  INR 1.2 (INR reference interval applies to patients on anticoagulant therapy. A single INR therapeutic range for coumarins is not optimal for all indications; however, the suggested range for most indications is 2.0 - 3.0. Exceptions to the INR Reference Range may include: Prosthetic heart valves, acute myocardial infarction, prevention of myocardial infarction, and combinations of aspirin and anticoagulant. The need for a  higher or lower target INR must be assessed individually. Reference: The Pharmacology and Management of the Vitamin K  antagonists: the seventh ACCP Conference on Antithrombotic and Thrombolytic Therapy. [id number] Sept:126 (3suppl): N9146842. A HCT value >55% may artifactually increase the PT.  In one study,  the increase was an average of 25%. Reference:  "Effect on Routine and Special Coagulation Testing Values of Citrate Anticoagulant Adjustment in Patients with High HCT Values." American Journal of Clinical Pathology 2006;126:400-405.)  Routine Hem:  12-May-15 16:54   WBC (CBC) 5.6  RBC (CBC)  2.29  Hemoglobin (CBC)  4.2  Hematocrit (CBC)  15.0  Platelet Count (CBC) 179  MCV  65  MCH  18.5  MCHC  28.3  RDW  21.1  13-May-15 02:47   Hemoglobin (CBC) -  Retic Count 1.81  Absolute Retic Count 0.0529 (Result(s) reported on 17 Jun 2013 at 09:19AM.)    04:16   WBC (CBC) 7.4  RBC (CBC)  2.90  Hemoglobin (CBC)  6.3  Hematocrit (CBC)  20.8  Platelet Count (CBC) 157  MCV  72  MCH  21.8  MCHC  30.3  RDW  25.2  Neutrophil % 73.9  Lymphocyte % 12.8  Monocyte % 11.9  Eosinophil % 0.4  Basophil % 1.0  Neutrophil # 5.5  Lymphocyte #  0.9  Monocyte # 0.9  Eosinophil # 0.0  Basophil # 0.1 (Result(s) reported on 17 Jun 2013 at 04:43AM.)   EKG:  EKG Interp. by me   Interpretation sb, 54, lvh, inferolat st/t changes.   Radiology Results: XRay:    12-May-15 17:26, Chest PA and Lateral  Chest PA and Lateral   REASON FOR EXAM:    Shortness of Breath  COMMENTS:   May transport without cardiac monitor    PROCEDURE: DXR - DXR CHEST PA (OR AP) AND LATERAL  - Jun 16 2013  5:26PM     CLINICAL DATA:  Shortness of breath and weakness.    EXAM:  CHEST  2 VIEW    COMPARISON:  04/18/2011    FINDINGS:  There is mild cardiac enlargement. Both lungs are clear. The  visualized skeletal structures are unremarkable.   IMPRESSION:  No active cardiopulmonary  disease.      Electronically Signed    By: Kerby Moors M.D.    On: 06/16/2013 17:28         Verified By: Angelita Ingles, M.D.,    No Known Allergies:   Vital Signs/Nurse's Notes: **Vital Signs.:  13-May-15 09:20  Vital Signs Type 15 min Post Blood Start Time  Temperature Temperature (F) 98.3  Celsius 36.8  Temperature Source oral  Pulse Pulse 56  Respirations Respirations 24  Systolic BP Systolic BP 008  Diastolic BP (mmHg) Diastolic BP (mmHg) 47  Mean BP 77  Oxygen Delivery Room Air/ 21 %  *Intake and Output.:   Daily 13-May-15 07:00  Grand Totals Intake:  1309 Output:  1550    Net:  -241 24 Hr.:  -241  IV (Primary)      In:  675  IV (Secondary)      In:  634  Urine ml     Out:  1550  Length of Stay Totals Intake:  1309 Output:  1550    Net:  -241    Impression 1.  Presumed acute GIB/Microcytic Anemia:  Denies melena/brbpr however stool FOB +.  Reports heavy Goody powder use about a month ago for left shoulder pain but otw has not been using nsaids.  Transfusions ongoing.  Heme w/u ongoing.  GI eval pending.  2.  DOE/Fatigue:  In setting of #1.  Currently euvolemic on exam. BNP elevated in setting of anemia and mild renal insufficiency with probable high output and baseline LVH.  Troponin nl despite anemia.  Doubt LV dysfxn.  Would hold off on checking echo until H/H closer to normal as he is likely to have some degree of high output in setting of profound anemia.    3.  Lower ext edema:  Says that he's been sitting much more than ususal in the past week b/c of lack of exercise tolerance.  Edema cleared completely with just one dose of IV lasix, despite also receiving 4 units of PRBC's up to this point.  Albumin down to 2.5 in setting of anemia.  Suspect dependent edema in the setting of anemia, hypoalbuminemia, and relatively high sodium diet (fast food several x/wk).  F/U echo tomorrow as above.    4.  HTN:  Stable.  5.  Acute kidney injury:  Creat 1.31 on  admission -> 1.29 this morning with transfusions.  Follow.   Electronic Signatures for Addendum Section:  Leonie Man (MD) (Signed Addendum 13-May-15 10:30)  I have personnaly seen & examined the patient this AM in consultation along with Mr. Angelica Ran, NP.  I agree with his findings, exam & impression/plan.  Relatively healthy gentleman with PMH of HTN admitted for dyspnea on exertion & edema. Found to be prfoundly anemic at [facility] referred to [facility] for admission.  Currently getting transfused (with room to go) & 1 dose IV Lasix -- edema resolved.   I agree with holding off on Echo until Hgb improved & stable -- anemia can explain dyspnea & with minimal edema that could be explained by whole blood loss (with reduced colligative properties in blood).  Could have high-output HF,but HR is not elevated.   With normal cardiac markers in the setting of significant anemia, ischemic CAD is highly unlikely. We have rescheduled Echo until tomorrow -- more recommendations pending findings.   Electronic Signatures: Rogelia Mire (NP)  (Signed 13-May-15 09:49)  Authored: General Aspect/Present Illness, History and Physical Exam, Review of System, Family & Social History, Past Medical History, Home Medications, Labs, EKG , Radiology, Allergies, Vital Signs/Nurse's Notes, Impression/Plan Leonie Man (MD)  (Signed 13-May-15 10:30)  Co-Signer: General Aspect/Present Illness, History and Physical Exam, Review of System, Family & Social History, Past Medical History, Home Medications, Labs, EKG , Radiology,  Allergies, Vital Signs/Nurse's Notes, Impression/Plan   Last Updated: 13-May-15 10:30 by Leonie Man (MD)

## 2014-05-29 NOTE — Consult Note (Signed)
Brief Consult Note: Diagnosis: IRON DEFICIENCY ANEMIA,  GUIAC POS STOOLS  ELEVATED PROTIME.   Patient was seen by consultant.   Comments: LABS CONSISTENT WITH IDA   LOW MCV  LOW FERRITIN OF 9. THE IRON AND IRON SAT IS SPURRIOUSKLY ELEVATED AFTER TRANSFUSION.  CURRENTLY NO SUSPICION OF OTHER CAUSES OF ANEMIA. THE HIGH PROTIME MAY BE MULTIFACTORIAL BUT MAY ALSO BE SPURRIOUS.  SUGGEST.    SUGGEST  REPEAT CBC, PT AND PTT IN AM. PO IRON AFTER LUMINAL STUDY. THEN SERIAL HGB, TO CONFIRM HGB RESPONDS TO PO IRON. IF HGB NOT LATER NORMAL TTHEN HEME ONC OUT PATIENT EVALUATION FOR POSSIBLE ANEMIA AND /OR IV IRON.  Electronic Signatures: Dallas Schimke (MD)  (Signed 13-May-15 18:42)  Authored: Brief Consult Note   Last Updated: 13-May-15 18:42 by Dallas Schimke (MD)

## 2014-05-29 NOTE — Op Note (Signed)
PATIENT NAME:  [name], [name] MR#:  [id number] DATE OF BIRTH:  02-Feb-1946  DATE OF PROCEDURE:  06/23/2013  PREOPERATIVE DIAGNOSIS: Descending colon cancer, possible ascending colon polyp with cancerous involvement.   POSTOPERATIVE DIAGNOSES:  Descending colon cancer; benign pericecal cyst.   OPERATIONS PERFORMED: 1. Descending and proximal sigmoid colectomy with primary anastomosis.  2. Left ureterolysis.  3. Partial cecectomy.  4. Hand-assisted laparoscopic splenic flexure mobilization.  5. Incidental appendectomy.   ANESTHESIA: General.   SURGEON: Consuela Mimes, MD   FIRST ASSISTANT: Dr. Marlyce Huge  PROCEDURE IN DETAIL: The patient was placed supine on the operating room table and prepped and draped in the usual sterile fashion. A 4-inch incision was made in the midline from just above the umbilicus down towards the pubis and this was carried down through the subcutaneous tissue and the linea alba and the peritoneum were entered carefully. A GelPort was placed and a 15 mmHg CO2 pneumoperitoneum was created and 2 additional 5 mm trocars were placed. The laparoscope was used and the liver was visualized and there were no obvious liver lesions and there were no obvious peritoneal metastases. The liver was also palpated with my left hand and there were no abnormal liver masses. A splenic flexure mobilization was undertaken by taking down the white line of Toldt along the proximal descending colon and dividing adhesions to the spleen and the left peritoneum. The lesser sac was entered through the gastrocolic ligament at the distal transverse colon, and using the Harmonic scalpel, this was completely divided and then the splenic flexure was mobilized off of Gerota's fascia. In doing so, a small area of thermal injury just near the splenic flexure itself was generated with the Harmonic scalpel and this was subsequently repaired with 3-0 silk seromuscular Lembert sutures once I opened.  Prior to opening, I mobilized the hepatic flexure and the ascending colon with the Harmonic scalpel in order to visualize the entire ascending colon. I could only find one area of abnormality, which was a pericecal peritoneal cyst that was approximately 1 cm in diameter. There were no areas of the ascending colon that were palpated that were felt to be abnormal and there were no areas that showed any sign of previous polypectomy and certainly no areas of cancer. I then desufflated the abdomen and extended the incision down toward the pubis for another 2 inches and first performed an appendectomy by taking down the mesoappendix with the Harmonic scalpel and dividing the appendix just where it met the cecum with the GIA stapling device. I then tediously dissected the pericecal cyst off of a portion of the cecal wall and then performed a partial cecectomy in such a fashion that it did not compromise the lumen of the cecum or the ileocecal valve with another firing of the GIA stapling device. This was sent for a frozen section diagnosis and returned benign peritoneal cyst. The cancer was attached to the parietal peritoneum on the lateral sidewall and I included at least a centimeter of parietal peritoneum in the dissection as I mobilized the distal descending colon and splenic flexure. In doing so, I also included all of the retroperitoneal fat along the groove between the abdominal musculature and the psoas muscle as well as essentially stripped the psoas muscle of all its peritoneal fat as the tumor was so close to this area. It was not invading the area and was not fixed, however. The tumor was within a millimeter of the left testicular artery and vein,  and therefore I ligated these proximally and distally to the major portion of the resection with 2-0 silk ligatures in order to include a portion of the gonadal vessels with the mesocolon next to the tumor. The ureter was not directly involved but was within a  millimeter of the tumor in the mesocolon, and therefore I performed a tedious ureterolysis along the majority of the length of the proximal and mid-thirds of the ureter in order to not harm the ureter but also obtain a maximal mesocolonic dissection. In doing so, the ureter was lysed along a 6-8 inch length, and following this, I selected a suitable location in the mid to distal sigmoid colon to divide the colon a suitable distance from the tumor. This was done with a GIA stapling device, and similarly, the very proximal descending colon right near the splenic flexure was also divided with the GIA stapling device. All of the descending colon, mesocolon, and proximal sigmoid mesocolon were included in the dissection down to the left iliac artery and aorta and I included all of the mesocolon above the adventitia of the aorta up to the inferior mesenteric artery. Where the inferior mesenteric artery emanated from the aorta, I ligated it with 2-0 silk ligature and also ligated the inferior mesenteric vein in this area with a 2-0 silk ligature and I took a generous section of retroperitoneum and mesocolon leaving the aorta and left ureter bare in the retroperitoneum. Following the dissection, the 2 areas of colon came together without tension but there was not enough laxity to perform a side-to-side stapled anastomosis, and therefore I performed an end-to-end handsewn anastomosis. This was undertaken with an external layer of 3-0 silk seromuscular Lembert sutures and an internal layer of running 3-0 Monocryl full-thickness suture line. There was no way to reconstruct the mesenteric defect and therefore I lay now fairly straight left colon in the left gutter and all of the small intestine in its anatomic position to the right of that left colon. I irrigated the peritoneal cavity with copious amounts of warm normal saline and this was suctioned clear, and after changing gown and gloves and redraping the patient with new  sterile instruments, the peritoneum was again irrigated with warm normal saline which was suctioned clear and the linea alba was closed with a running #1 PDS suture. The skin was reapproximated with a skin stapling device and a sterile dressing was applied completing the procedure. The patient tolerated the procedure well and there were no complications.    ____________________________ Consuela Mimes, MD wfm:dd D: 06/23/2013 18:16:12 ET T: 06/23/2013 18:38:31 ET JOB#: [id number]  cc: Consuela Mimes, MD, <Dictator> Consuela Mimes MD ELECTRONICALLY SIGNED 06/24/2013 8:09

## 2014-05-29 NOTE — Consult Note (Signed)
Covering for Dr. Rayann Heman. EGD showed superficial duodenal ulcer, gastritis, reflux esophagitis. Multiple biopsies taken. Continue PPI bid and avoid NSAIDS. Colonoscopy showed an ascending colon polyp that was removed and a large circumferential, nearly obstructing sigmoid mass at 30cm. Multiple biopsies taken. Recommend CT of abd/pelvis to assess size of mass and possibility of metastasis. Recommend surgery consult as well as oncology involvement. Thanks.  Electronic Signatures: Verdie Shire (MD)  (Signed on 15-May-15 10:55)  Authored  Last Updated: 15-May-15 10:55 by Verdie Shire (MD)

## 2014-05-29 NOTE — Consult Note (Signed)
PATIENT NAME:  [name], [name] MR#:  [id number] DATE OF BIRTH:  Nov 02, 1945  DATE OF CONSULTATION:  06/19/2013  CONSULTING PHYSICIAN:  Consuela Mimes, MD  HISTORY OF PRESENT ILLNESS:  Mr. Mccue is a 69 year old black male who was admitted to the hospital 3 days ago with severe iron deficiency anemia and possible congestive heart failure who was found to have gastrointestinal bleeding. He underwent EGD, which showed reflux esophagitis and erosive gastritis with 1 superficial duodenal ulcer. He also underwent colonoscopy wherein he had an ascending colon polyp removed and he was found to have an infiltrative partially obstructing circumferential mass at 30 cm. This was biopsied and pathology is still pending. He subsequently had a CT scan of the abdomen and pelvis with IV and oral contrast and this also revealed a partially exophytic thick mass in the descending sigmoid colon junction without any evidence of distant metastatic disease. During the patient's hospitalization, he was found to have a BNP of 4300 and he has had an echocardiogram, which shows moderate mitral valve regurgitation and an ejection fraction of 40% to 45%. His hemoglobin on admission was 4.2 and he had 4 unit packed red blood cells transfusion with no additional acute bleeding, but his hemoglobin has only rebounded to 8.8. He has had a 30 to 40 pound weight loss recently, as well as inappropriately low reticulocyte count and he had a mild renal insufficiency on admission which was thought to be acute, as it has resolved. Coags are normal and liver function tests are normal with the exception of hypoalbuminemia (2.5) completing his evaluation thus far.   SOCIAL HISTORY:  The patient is married and does not smoke cigarettes or use alcohol. He used to work for the city of [employer].   FAMILY HISTORY:  Both of the patient's parents died when he was very young of some type of cancer.   REVIEW OF SYSTEMS:  Negative for 10 systems except  as mentioned in the history of present illness with the exception of  recent fatigue, weakness and weight loss.   PHYSICAL EXAMINATION: GENERAL:  Height 5 feet, 9 inches, weight 144 pounds, BMI 21.3.  VITAL SIGNS:  Afebrile. Pulse 60, blood pressure 152/73, oxygen saturation 100% on room air at rest.  HEENT:  Pupils equally round and reactive to light. Extraocular movements intact. Sclerae anicteric. Oropharynx clear. Mucous membranes moist. Hearing intact to voice.  NECK:  Supple without tracheal deviation or jugular venous distention.  HEART:  Regular rate and rhythm with no murmurs or rubs.  LUNGS:  Clear to auscultation with normal respiratory effort bilaterally.  ABDOMEN:  Soft, nontender, nondistended with no palpable hepatosplenomegaly or other masses.  EXTREMITIES:  No edema, with normal capillary refill bilaterally.  NEUROLOGIC:  Cranial nerves II through XII, motor and sensation grossly intact.  PSYCHIATRIC:  Alert and oriented x 4. Appropriate affect.   ASSESSMENT:   1.  Very likely nonobstructing colon cancer of the descending sigmoid junction.  2. Mild congestive heart failure due to diastolic and systolic dysfunction and mitral valve regurgitation.  3.  Anemia, partially corrected   PLAN:  I had a frank discussion with the patient, as well as his daughter, who was present and explained to him that the primary treatment for colon cancer was surgical resection. Although I gave him the option of hospital discharge and subsequent outpatient surgery with an outpatient bowel prep, I believe that inpatient procedure during this hospitalization would be best. He agreed, as did his daughter. Since his  pathology will likely be pending for another couple of days, I have tentatively planned to let him eat and repeat his bowel prep on Monday and perform his surgery on Tuesday. I have ordered a CEA and will continue to follow him with you through the weekend and prior to surgery, and then transfer  him to my service postoperatively.    ____________________________ Consuela Mimes, MD wfm:dmm D: 06/19/2013 17:40:45 ET T: 06/19/2013 19:17:32 ET JOB#: [id number]  cc: Consuela Mimes, MD, <Dictator> Consuela Mimes MD ELECTRONICALLY SIGNED 06/20/2013 11:17

## 2014-05-29 NOTE — Consult Note (Signed)
Details:   - GI Note:  Plan for EGD and colon tomorrow morning if echo ok.   Please give enitire go-lytely prep tonight at 5 pm.   NPO after midnight.   Electronic Signatures: Arther Dames (MD)  (Signed 14-May-15 10:53)  Authored: Details   Last Updated: 14-May-15 10:53 by Arther Dames (MD)

## 2014-05-31 LAB — SURGICAL PATHOLOGY

## 2014-06-15 ENCOUNTER — Ambulatory Visit
Admission: RE | Admit: 2014-06-15 | Discharge: 2014-06-15 | Disposition: A | Discharge status: Home / Self Care | Payer: Medicare HMO | Source: Ambulatory Visit | Attending: Hematology and Oncology | Admitting: Hematology and Oncology

## 2014-06-15 ENCOUNTER — Other Ambulatory Visit: Payer: Self-pay | Admitting: Hematology and Oncology

## 2014-06-15 DIAGNOSIS — M79606 Pain in leg, unspecified: Secondary | ICD-10-CM

## 2014-06-15 DIAGNOSIS — M7989 Other specified soft tissue disorders: Secondary | ICD-10-CM | POA: Insufficient documentation

## 2014-06-15 DIAGNOSIS — M79604 Pain in right leg: Secondary | ICD-10-CM | POA: Diagnosis present

## 2014-06-23 ENCOUNTER — Inpatient Hospital Stay: Payer: Medicare HMO | Attending: Hematology and Oncology

## 2014-06-23 DIAGNOSIS — C189 Malignant neoplasm of colon, unspecified: Secondary | ICD-10-CM | POA: Insufficient documentation

## 2014-06-23 DIAGNOSIS — C801 Malignant (primary) neoplasm, unspecified: Secondary | ICD-10-CM

## 2014-06-23 DIAGNOSIS — Z452 Encounter for adjustment and management of vascular access device: Secondary | ICD-10-CM | POA: Diagnosis not present

## 2014-06-23 MED ORDER — SODIUM CHLORIDE 0.9 % IJ SOLN
10.0000 mL | INTRAMUSCULAR | Status: DC | PRN
  Administered 2014-06-23: 10 mL via INTRAVENOUS
  Filled 2014-06-23: qty 10

## 2014-06-23 MED ORDER — HEPARIN SOD (PORK) LOCK FLUSH 100 UNIT/ML IV SOLN
500.0000 [IU] | Freq: Once | INTRAVENOUS | Status: AC
  Administered 2014-06-23: 500 [IU] via INTRAVENOUS
  Filled 2014-06-23: qty 5

## 2014-07-01 ENCOUNTER — Encounter
Admission: RE | Admit: 2014-07-01 | Discharge: 2014-07-01 | Disposition: A | Discharge status: Home / Self Care | Payer: Medicare HMO | Source: Ambulatory Visit | Attending: Surgery | Admitting: Surgery

## 2014-07-01 DIAGNOSIS — Z01810 Encounter for preprocedural cardiovascular examination: Secondary | ICD-10-CM | POA: Diagnosis not present

## 2014-07-01 DIAGNOSIS — I10 Essential (primary) hypertension: Secondary | ICD-10-CM | POA: Diagnosis not present

## 2014-07-01 DIAGNOSIS — Z01812 Encounter for preprocedural laboratory examination: Secondary | ICD-10-CM | POA: Diagnosis present

## 2014-07-01 HISTORY — DX: Anemia, unspecified: D64.9

## 2014-07-01 HISTORY — DX: Essential (primary) hypertension: I10

## 2014-07-01 LAB — POTASSIUM: Potassium: 3.9 mmol/L (ref 3.5–5.1)

## 2014-07-01 NOTE — Patient Instructions (Signed)
  Your procedure is scheduled on: July 12, 2014 (Monday) Report to Same Day Surgery.[facility]) To find out your arrival time please call [phone] between 1PM - 3PM on July 09, 2014 (Friday).  Remember: Instructions that are not followed completely may result in serious medical risk, up to and including death, or upon the discretion of your surgeon and anesthesiologist your surgery may need to be rescheduled.    __x__ 1. Do not eat food or drink liquids after midnight. No gum chewing or hard candies.     ____ 2. No Alcohol for 24 hours before or after surgery.   ____ 3. Bring all medications with you on the day of surgery if instructed.    __x__ 4. Notify your doctor if there is any change in your medical condition     (cold, fever, infections).     Do not wear jewelry, make-up, hairpins, clips or nail polish.  Do not wear lotions, powders, or perfumes. You may wear deodorant.  Do not shave 48 hours prior to surgery. Men may shave face and neck.  Do not bring valuables to the hospital.    [facility] is not responsible for any belongings or valuables.               Contacts, dentures or bridgework may not be worn into surgery.  Leave your suitcase in the car. After surgery it may be brought to your room.  For patients admitted to the hospital, discharge time is determined by your                treatment team.   Patients discharged the day of surgery will not be allowed to drive home.   Please read over the following fact sheets that you were given:   Surgical Site Infection Prevention   ____ Take these medicines the morning of surgery with A SIP OF WATER:    1.   2.   3.   4.  5.  6.  ____ Fleet Enema (as directed)   __x__ Use CHG Soap as directed  ____ Use inhalers on the day of surgery  ____ Stop metformin 2 days prior to surgery    ____ Take 1/2 of usual insulin dose the night before surgery and none on the morning of surgery.   ____ Stop  Coumadin/Plavix/aspirin on   ____ Stop Anti-inflammatories on    ____ Stop supplements until after surgery.    ____ Bring C-Pap to the hospital.

## 2014-07-06 NOTE — OR Nursing (Signed)
ekg ok by Dr Janeann Forehand 07/02/14

## 2014-07-07 DIAGNOSIS — Z85038 Personal history of other malignant neoplasm of large intestine: Secondary | ICD-10-CM | POA: Diagnosis not present

## 2014-07-07 DIAGNOSIS — Z79899 Other long term (current) drug therapy: Secondary | ICD-10-CM | POA: Diagnosis not present

## 2014-07-07 DIAGNOSIS — Z79891 Long term (current) use of opiate analgesic: Secondary | ICD-10-CM | POA: Diagnosis not present

## 2014-07-07 DIAGNOSIS — D649 Anemia, unspecified: Secondary | ICD-10-CM | POA: Diagnosis not present

## 2014-07-07 DIAGNOSIS — I10 Essential (primary) hypertension: Secondary | ICD-10-CM | POA: Diagnosis not present

## 2014-07-07 DIAGNOSIS — Z452 Encounter for adjustment and management of vascular access device: Secondary | ICD-10-CM | POA: Diagnosis present

## 2014-07-11 MED ORDER — LIDOCAINE HCL (PF) 1 % IJ SOLN
INTRAMUSCULAR | Status: AC
  Filled 2014-07-11: qty 30

## 2014-07-11 MED ORDER — BUPIVACAINE HCL (PF) 0.5 % IJ SOLN
INTRAMUSCULAR | Status: AC
  Filled 2014-07-11: qty 30

## 2014-07-12 ENCOUNTER — Telehealth: Payer: Self-pay | Admitting: Surgery

## 2014-07-12 ENCOUNTER — Encounter: Payer: Self-pay | Admitting: *Deleted

## 2014-07-12 ENCOUNTER — Ambulatory Visit
Admission: RE | Admit: 2014-07-12 | Discharge: 2014-07-12 | Disposition: A | Discharge status: Home / Self Care | Payer: Medicare HMO | Source: Ambulatory Visit | Attending: Surgery | Admitting: Surgery

## 2014-07-12 ENCOUNTER — Ambulatory Visit: Payer: Medicare HMO | Admitting: Certified Registered Nurse Anesthetist

## 2014-07-12 ENCOUNTER — Ambulatory Visit: Payer: Medicare HMO | Admitting: Anesthesiology

## 2014-07-12 ENCOUNTER — Encounter
Admission: RE | Disposition: A | Discharge status: Home / Self Care | Payer: Self-pay | Source: Ambulatory Visit | Attending: Surgery

## 2014-07-12 DIAGNOSIS — C189 Malignant neoplasm of colon, unspecified: Secondary | ICD-10-CM | POA: Diagnosis not present

## 2014-07-12 DIAGNOSIS — Z452 Encounter for adjustment and management of vascular access device: Secondary | ICD-10-CM | POA: Diagnosis not present

## 2014-07-12 DIAGNOSIS — Z79899 Other long term (current) drug therapy: Secondary | ICD-10-CM | POA: Insufficient documentation

## 2014-07-12 DIAGNOSIS — Z79891 Long term (current) use of opiate analgesic: Secondary | ICD-10-CM | POA: Insufficient documentation

## 2014-07-12 DIAGNOSIS — D649 Anemia, unspecified: Secondary | ICD-10-CM | POA: Insufficient documentation

## 2014-07-12 DIAGNOSIS — I10 Essential (primary) hypertension: Secondary | ICD-10-CM | POA: Insufficient documentation

## 2014-07-12 DIAGNOSIS — Z85038 Personal history of other malignant neoplasm of large intestine: Secondary | ICD-10-CM | POA: Insufficient documentation

## 2014-07-12 HISTORY — PX: PORT-A-CATH REMOVAL: SHX5289

## 2014-07-12 SURGERY — REMOVAL PORT-A-CATH
Anesthesia: Monitor Anesthesia Care

## 2014-07-12 MED ORDER — ONDANSETRON HCL 4 MG/2ML IJ SOLN: 4.0000 mg | Freq: Once | INTRAMUSCULAR | Status: DC | PRN

## 2014-07-12 MED ORDER — FENTANYL CITRATE (PF) 100 MCG/2ML IJ SOLN: 25.0000 ug | INTRAMUSCULAR | Status: DC | PRN

## 2014-07-12 MED ORDER — LIDOCAINE HCL (CARDIAC) 20 MG/ML IV SOLN
INTRAVENOUS | Status: DC | PRN
  Administered 2014-07-12: 80 mg via INTRAVENOUS

## 2014-07-12 MED ORDER — LACTATED RINGERS IV SOLN
INTRAVENOUS | Status: DC
  Administered 2014-07-12: 07:00:00 via INTRAVENOUS

## 2014-07-12 MED ORDER — PROPOFOL INFUSION 10 MG/ML OPTIME
INTRAVENOUS | Status: DC | PRN
  Administered 2014-07-12: 25 ug/kg/min via INTRAVENOUS

## 2014-07-12 MED ORDER — FAMOTIDINE 20 MG PO TABS
ORAL_TABLET | ORAL | Status: AC
  Administered 2014-07-12: 20 mg via ORAL
  Filled 2014-07-12: qty 1

## 2014-07-12 MED ORDER — MIDAZOLAM HCL 2 MG/2ML IJ SOLN
INTRAMUSCULAR | Status: DC | PRN
  Administered 2014-07-12: 2 mg via INTRAVENOUS

## 2014-07-12 MED ORDER — FENTANYL CITRATE (PF) 100 MCG/2ML IJ SOLN
INTRAMUSCULAR | Status: DC | PRN
  Administered 2014-07-12 (×2): 50 ug via INTRAVENOUS

## 2014-07-12 MED ORDER — CEFAZOLIN SODIUM-DEXTROSE 2-3 GM-% IV SOLR
2.0000 g | Freq: Once | INTRAVENOUS | Status: AC
  Administered 2014-07-12: 2 g via INTRAVENOUS

## 2014-07-12 MED ORDER — LIDOCAINE HCL 1 % IJ SOLN
INTRAMUSCULAR | Status: DC | PRN
  Administered 2014-07-12: 2 mL via INTRADERMAL

## 2014-07-12 MED ORDER — FAMOTIDINE 20 MG PO TABS
20.0000 mg | ORAL_TABLET | Freq: Once | ORAL | Status: AC
  Administered 2014-07-12: 20 mg via ORAL

## 2014-07-12 MED ORDER — CEFAZOLIN SODIUM-DEXTROSE 2-3 GM-% IV SOLR
INTRAVENOUS | Status: AC
  Administered 2014-07-12: 2 g via INTRAVENOUS
  Filled 2014-07-12: qty 50

## 2014-07-12 SURGICAL SUPPLY — 28 items
BLADE SURG 15 STRL LF DISP TIS (BLADE) ×2 IMPLANT
BLADE SURG 15 STRL SS (BLADE) ×2
CANISTER SUCT 1200ML W/VALVE (MISCELLANEOUS) ×2 IMPLANT
CHLORAPREP W/TINT 26ML (MISCELLANEOUS) ×2 IMPLANT
COVER LIGHT HANDLE STERIS (MISCELLANEOUS) ×4 IMPLANT
DECANTER SPIKE VIAL GLASS SM (MISCELLANEOUS) ×2 IMPLANT
DRAPE LAPAROTOMY TRNSV 106X77 (MISCELLANEOUS) ×2 IMPLANT
ELECT CAUTERY BLADE 6.4 (BLADE) ×2 IMPLANT
GLOVE BIO SURGEON STRL SZ7.5 (GLOVE) ×10 IMPLANT
GOWN STRL REUS W/ TWL LRG LVL3 (GOWN DISPOSABLE) ×1 IMPLANT
GOWN STRL REUS W/ TWL XL LVL3 (GOWN DISPOSABLE) ×2 IMPLANT
GOWN STRL REUS W/TWL LRG LVL3 (GOWN DISPOSABLE) ×1
GOWN STRL REUS W/TWL XL LVL3 (GOWN DISPOSABLE) ×2
KIT RM TURNOVER STRD PROC AR (KITS) ×2 IMPLANT
LABEL OR SOLS (LABEL) ×2 IMPLANT
NDL SAFETY 25GX1.5 (NEEDLE) ×2 IMPLANT
NS IRRIG 500ML POUR BTL (IV SOLUTION) ×2 IMPLANT
PACK BASIN MINOR ARMC (MISCELLANEOUS) ×2 IMPLANT
PAD GROUND ADULT SPLIT (MISCELLANEOUS) ×2 IMPLANT
STRIP SUTURE WOUND CLOSURE 1/2 (SUTURE) ×2 IMPLANT
SUT MON AB 5-0 P3 18 (SUTURE) IMPLANT
SUT VIC AB 2-0 SH 27 (SUTURE)
SUT VIC AB 2-0 SH 27XBRD (SUTURE) IMPLANT
SUT VIC AB 4-0 FS2 27 (SUTURE) ×2 IMPLANT
SUT VIC AB 4-0 RB1 27 (SUTURE) ×1
SUT VIC AB 4-0 RB1 27X BRD (SUTURE) ×1 IMPLANT
SWABSTK COMLB BENZOIN TINCTURE (MISCELLANEOUS) ×2 IMPLANT
SYRINGE 10CC LL (SYRINGE) ×2 IMPLANT

## 2014-07-12 NOTE — Anesthesia Postprocedure Evaluation (Signed)
  Anesthesia Post-op Note  Patient: [name].  Procedure(s) Performed: Procedure(s): REMOVAL PORT-A-CATH (N/A)  Anesthesia type:MAC  Patient location: PACU  Post pain: Pain level controlled  Post assessment: Post-op Vital signs reviewed, Patient's Cardiovascular Status Stable, Respiratory Function Stable, Patent Airway and No signs of Nausea or vomiting  Post vital signs: Reviewed and stable  Last Vitals:  Filed Vitals:   07/12/14 0919  BP: 117/60  Pulse:   Temp: 36.3 C  Resp: 14    Level of consciousness: awake, alert  and patient cooperative  Complications: No apparent anesthesia complications

## 2014-07-12 NOTE — Anesthesia Preprocedure Evaluation (Signed)
Anesthesia Evaluation  Patient identified by MRN, date of birth, ID band Patient awake    Reviewed: Allergy & Precautions, H&P , NPO status , Patient's Chart, lab work & pertinent test results, reviewed documented beta blocker date and time   Airway Mallampati: II  TM Distance: >3 FB Neck ROM: full    Dental no notable dental hx.    Pulmonary neg pulmonary ROS,  breath sounds clear to auscultation  Pulmonary exam normal       Cardiovascular Exercise Tolerance: Good hypertension, negative cardio ROS  Rhythm:regular Rate:Normal     Neuro/Psych negative neurological ROS  negative psych ROS   GI/Hepatic negative GI ROS, Neg liver ROS,   Endo/Other  negative endocrine ROS  Renal/GU negative Renal ROS  negative genitourinary   Musculoskeletal   Abdominal   Peds  Hematology negative hematology ROS (+) anemia ,   Anesthesia Other Findings   Reproductive/Obstetrics negative OB ROS                             Anesthesia Physical Anesthesia Plan  ASA: II  Anesthesia Plan: MAC   Post-op Pain Management:    Induction:   Airway Management Planned:   Additional Equipment:   Intra-op Plan:   Post-operative Plan:   Informed Consent: I have reviewed the patients History and Physical, chart, labs and discussed the procedure including the risks, benefits and alternatives for the proposed anesthesia with the patient or authorized representative who has indicated his/her understanding and acceptance.   Dental Advisory Given  Plan Discussed with: CRNA  Anesthesia Plan Comments:         Anesthesia Quick Evaluation

## 2014-07-12 NOTE — Progress Notes (Signed)
H&P unchanged. Paper copy to be scanned into the system. The risks of surgery were again reviewed with the patient and family and they agree to proceed.

## 2014-07-12 NOTE — Anesthesia Procedure Notes (Signed)
Procedure Name: MAC Performed by: Chantelle Verdi Pre-anesthesia Checklist: Patient identified, Emergency Drugs available, Suction available, Patient being monitored and Timeout performed Oxygen Delivery Method: Simple face mask       

## 2014-07-12 NOTE — Op Note (Signed)
Operative Note  Preoperative Diagnosis: Left subclavian vein Port-A-Cath  Postoperative Diagnosis: Same  Operation Performed: Removal of left subclavian vein Port-A-Cath  Surgeon: Laverle Patter., M.D.   Assistant: None  Anesthesia: General Endotracheal  Date of Procedure: 07/12/2014   Procedure in Detail:  The risks (including the possibility of adjacent organ injury, the necessity of converting to an open procedure, and the risk of postoperative infection / abscess), potential benefits, non-surgical treatment options, and expected outcomes were reviewed with the patient. The patient concurred with the proposed plan and agreed to proceed, giving informed consent.   Prior to the induction of anesthesia, antibiotic prophylaxis was administered. The patient was placed supine on the OR table and prepped and draped in the usual sterile fashion.   An incision was made through the insertion incision and this was carried down to the Port-A-Cath which was removed from its pocket with electrocautery. A figure-of-eight 4-0 Vicryl was placed on the fibrin sheath upon withdrawal, a subdermal interrupted 4-0 Vicryl closure was performed and the skin was reapproximated with a running subcuticular 5-0 Monocryl and suture strips was applied completing the procedure.  Specimens: Port-A-Cath           Complications: None; the patient tolerated the procedure well.   Consuela Mimes, MD 07/12/2014

## 2014-07-12 NOTE — Discharge Instructions (Addendum)
Keep site dry for 2 days. Then may shower.                 AMBULATORY SURGERY  DISCHARGE INSTRUCTIONS   1) The drugs that you were given will stay in your system until tomorrow so for the next 24 hours you should not:  A) Drive an automobile B) Make any legal decisions C) Drink any alcoholic beverage   2) You may resume regular meals tomorrow.  Today it is better to start with liquids and gradually work up to solid foods.  You may eat anything you prefer, but it is better to start with liquids, then soup and crackers, and gradually work up to solid foods.   3) Please notify your doctor immediately if you have any unusual bleeding, trouble breathing, redness and pain at the surgery site, drainage, fever, or pain not relieved by medication. 4)   5) Your post-operative visit with Dr.    George Ina                                 is: Date:                        Time:    Please call to schedule your post-operative visit.  6) Additional Instructions:

## 2014-07-12 NOTE — Transfer of Care (Signed)
Immediate Anesthesia Transfer of Care Note  Patient: [name].  Procedure(s) Performed: Procedure(s): REMOVAL PORT-A-CATH (N/A)  Patient Location: PACU  Anesthesia Type:MAC  Level of Consciousness: awake, alert  and oriented  Airway & Oxygen Therapy: Patient Spontanous Breathing and Patient connected to face mask oxygen  Post-op Assessment: Report given to RN and Post -op Vital signs reviewed and stable  Post vital signs: Reviewed and stable  Last Vitals:  Filed Vitals:   07/12/14 0803  BP: 109/69  Pulse: 57  Temp: 36.7 C  Resp: 16    Complications: No apparent anesthesia complications

## 2014-07-20 DIAGNOSIS — K635 Polyp of colon: Secondary | ICD-10-CM | POA: Insufficient documentation

## 2014-07-20 DIAGNOSIS — I10 Essential (primary) hypertension: Secondary | ICD-10-CM | POA: Insufficient documentation

## 2014-07-20 DIAGNOSIS — C187 Malignant neoplasm of sigmoid colon: Secondary | ICD-10-CM

## 2014-07-20 DIAGNOSIS — D126 Benign neoplasm of colon, unspecified: Secondary | ICD-10-CM | POA: Insufficient documentation

## 2014-07-20 DIAGNOSIS — Z85038 Personal history of other malignant neoplasm of large intestine: Secondary | ICD-10-CM | POA: Insufficient documentation

## 2014-07-20 HISTORY — DX: Essential (primary) hypertension: I10

## 2014-07-21 ENCOUNTER — Other Ambulatory Visit: Payer: Self-pay | Admitting: *Deleted

## 2014-07-21 ENCOUNTER — Other Ambulatory Visit: Payer: Self-pay | Admitting: Surgery

## 2014-07-21 NOTE — Telephone Encounter (Signed)
Pt needs a refill on Furosemide [facility] on [facility]

## 2014-07-21 NOTE — Telephone Encounter (Signed)
Advised that I would check to see if Dr Mike Gip will fill med, states his PMD has retired, I advised that he will have to find a new PMD for future refills

## 2014-07-21 NOTE — Telephone Encounter (Signed)
Called patient and had to leave a vm to return my call. I need to tell patient that he no longer needs to take furosemide since his therapy was changed while he was at the hospital. Please read Dr. Delice Lesch hospital notes.

## 2014-07-22 NOTE — Telephone Encounter (Signed)
Called patient for the second time and left him a vm.

## 2014-07-22 NOTE — Telephone Encounter (Signed)
Advised that he will have to find PMD, Dr Mike Gip said if he cannot get into see someone in a week, she will give him a 1 week supply. He repeated this back to me several times and I explained that he will have to find a replacement for Dr Burt Ek who retired or he can go to an Urgent care center. He replied "Oh, OK"

## 2014-07-27 ENCOUNTER — Other Ambulatory Visit: Payer: Self-pay | Admitting: Surgery

## 2014-08-19 ENCOUNTER — Other Ambulatory Visit: Payer: Self-pay

## 2014-08-19 DIAGNOSIS — D509 Iron deficiency anemia, unspecified: Secondary | ICD-10-CM

## 2014-08-21 ENCOUNTER — Other Ambulatory Visit: Payer: Self-pay | Admitting: Surgery

## 2014-08-26 ENCOUNTER — Inpatient Hospital Stay: Payer: Medicare HMO | Admitting: Hematology and Oncology

## 2014-08-26 ENCOUNTER — Inpatient Hospital Stay: Payer: Medicare HMO | Attending: Hematology and Oncology

## 2014-09-03 ENCOUNTER — Inpatient Hospital Stay: Payer: Medicare HMO

## 2014-09-03 ENCOUNTER — Inpatient Hospital Stay: Payer: Medicare HMO | Admitting: Hematology and Oncology

## 2014-09-15 ENCOUNTER — Other Ambulatory Visit: Payer: Self-pay | Admitting: *Deleted

## 2014-09-15 DIAGNOSIS — C189 Malignant neoplasm of colon, unspecified: Secondary | ICD-10-CM

## 2014-09-16 ENCOUNTER — Inpatient Hospital Stay: Payer: Medicare HMO | Attending: Hematology and Oncology | Admitting: Hematology and Oncology

## 2014-09-16 ENCOUNTER — Inpatient Hospital Stay: Payer: Medicare HMO

## 2014-09-16 ENCOUNTER — Other Ambulatory Visit: Payer: Self-pay

## 2014-09-16 VITALS — BP 138/75 | HR 73 | Temp 96.2°F | Resp 18 | Ht 69.0 in | Wt 171.7 lb

## 2014-09-16 DIAGNOSIS — R600 Localized edema: Secondary | ICD-10-CM | POA: Insufficient documentation

## 2014-09-16 DIAGNOSIS — Z862 Personal history of diseases of the blood and blood-forming organs and certain disorders involving the immune mechanism: Secondary | ICD-10-CM

## 2014-09-16 DIAGNOSIS — G629 Polyneuropathy, unspecified: Secondary | ICD-10-CM | POA: Diagnosis not present

## 2014-09-16 DIAGNOSIS — I10 Essential (primary) hypertension: Secondary | ICD-10-CM | POA: Diagnosis not present

## 2014-09-16 DIAGNOSIS — C189 Malignant neoplasm of colon, unspecified: Secondary | ICD-10-CM | POA: Insufficient documentation

## 2014-09-16 DIAGNOSIS — R161 Splenomegaly, not elsewhere classified: Secondary | ICD-10-CM | POA: Diagnosis not present

## 2014-09-16 DIAGNOSIS — Z79899 Other long term (current) drug therapy: Secondary | ICD-10-CM | POA: Insufficient documentation

## 2014-09-16 DIAGNOSIS — D509 Iron deficiency anemia, unspecified: Secondary | ICD-10-CM

## 2014-09-16 DIAGNOSIS — C187 Malignant neoplasm of sigmoid colon: Secondary | ICD-10-CM

## 2014-09-16 LAB — CBC WITH DIFFERENTIAL/PLATELET
Basophils Absolute: 0 10*3/uL (ref 0–0.1)
Basophils Relative: 1 %
Eosinophils Absolute: 0.1 10*3/uL (ref 0–0.7)
Eosinophils Relative: 2 %
HCT: 41.7 % (ref 40.0–52.0)
Hemoglobin: 14.1 g/dL (ref 13.0–18.0)
Lymphocytes Relative: 19 %
Lymphs Abs: 0.7 10*3/uL — ABNORMAL LOW (ref 1.0–3.6)
MCH: 30.4 pg (ref 26.0–34.0)
MCHC: 33.7 g/dL (ref 32.0–36.0)
MCV: 89.9 fL (ref 80.0–100.0)
Monocytes Absolute: 0.4 10*3/uL (ref 0.2–1.0)
Monocytes Relative: 12 %
Neutro Abs: 2.2 10*3/uL (ref 1.4–6.5)
Neutrophils Relative %: 66 %
Platelets: 165 10*3/uL (ref 150–440)
RBC: 4.64 MIL/uL (ref 4.40–5.90)
RDW: 18.1 % — ABNORMAL HIGH (ref 11.5–14.5)
WBC: 3.4 10*3/uL — ABNORMAL LOW (ref 3.8–10.6)

## 2014-09-16 LAB — COMPREHENSIVE METABOLIC PANEL
ALT: 16 U/L — ABNORMAL LOW (ref 17–63)
AST: 22 U/L (ref 15–41)
Albumin: 3.9 g/dL (ref 3.5–5.0)
Alkaline Phosphatase: 85 U/L (ref 38–126)
Anion gap: 6 (ref 5–15)
BUN: 14 mg/dL (ref 6–20)
CO2: 25 mmol/L (ref 22–32)
Calcium: 8.8 mg/dL — ABNORMAL LOW (ref 8.9–10.3)
Chloride: 106 mmol/L (ref 101–111)
Creatinine, Ser: 1.32 mg/dL — ABNORMAL HIGH (ref 0.61–1.24)
GFR calc Af Amer: >60 mL/min (ref >60)
GFR calc non Af Amer: 54 mL/min — ABNORMAL LOW (ref >60)
Glucose, Bld: 197 mg/dL — ABNORMAL HIGH (ref 65–99)
Potassium: 4.2 mmol/L (ref 3.5–5.1)
Sodium: 137 mmol/L (ref 135–145)
Total Bilirubin: 0.8 mg/dL (ref 0.3–1.2)
Total Protein: 7.5 g/dL (ref 6.5–8.1)

## 2014-09-16 LAB — FERRITIN: Ferritin: 27 ng/mL (ref 24–336)

## 2014-09-16 LAB — IRON AND TIBC
Iron: 70 ug/dL (ref 45–182)
Saturation Ratios: 21 % (ref 17.9–39.5)
TIBC: 340 ug/dL (ref 250–450)
UIBC: 270 ug/dL

## 2014-09-16 LAB — PROTIME-INR
INR: 1
Prothrombin Time: 13.4 seconds (ref 11.4–15.0)

## 2014-09-16 MED ORDER — GABAPENTIN 100 MG PO CAPS
200.0000 mg | ORAL_CAPSULE | Freq: Three times a day (TID) | ORAL | Status: DC
Start: 2014-09-16 — End: 2015-01-14

## 2014-09-16 NOTE — Progress Notes (Signed)
Patient is here for follow-up of colon cancer and IDA. Patient states that he still has pretty bad neuropathy in his feet and hands.

## 2014-09-17 LAB — CEA: CEA: 1.5 ng/mL (ref 0.0–4.7)

## 2014-09-17 LAB — PT FACTOR INHIBITOR (MIXING STUDY)
1 HR INCUB PT 1:1NP: 13 s (ref 11.9–14.1)
PT 1:1NP: 13.7 s (ref 11.9–14.1)
PT: 14.2 s — ABNORMAL HIGH (ref 11.9–14.1)

## 2014-09-28 ENCOUNTER — Other Ambulatory Visit: Payer: Self-pay | Admitting: Surgery

## 2014-10-15 ENCOUNTER — Other Ambulatory Visit: Payer: Self-pay | Admitting: Surgery

## 2014-10-25 NOTE — Telephone Encounter (Signed)
error 

## 2014-11-08 ENCOUNTER — Other Ambulatory Visit: Payer: Self-pay | Admitting: Surgery

## 2014-11-26 ENCOUNTER — Other Ambulatory Visit: Payer: Self-pay | Admitting: Surgery

## 2014-11-29 ENCOUNTER — Other Ambulatory Visit: Payer: Self-pay | Admitting: *Deleted

## 2015-01-12 ENCOUNTER — Other Ambulatory Visit: Payer: Self-pay

## 2015-01-12 ENCOUNTER — Ambulatory Visit
Admission: RE | Admit: 2015-01-12 | Discharge: 2015-01-12 | Disposition: A | Discharge status: Home / Self Care | Payer: Medicare HMO | Source: Ambulatory Visit | Attending: Hematology and Oncology | Admitting: Hematology and Oncology

## 2015-01-12 DIAGNOSIS — K8020 Calculus of gallbladder without cholecystitis without obstruction: Secondary | ICD-10-CM | POA: Diagnosis not present

## 2015-01-12 DIAGNOSIS — I2510 Atherosclerotic heart disease of native coronary artery without angina pectoris: Secondary | ICD-10-CM | POA: Insufficient documentation

## 2015-01-12 DIAGNOSIS — R911 Solitary pulmonary nodule: Secondary | ICD-10-CM | POA: Diagnosis not present

## 2015-01-12 DIAGNOSIS — C189 Malignant neoplasm of colon, unspecified: Secondary | ICD-10-CM | POA: Diagnosis present

## 2015-01-12 DIAGNOSIS — I517 Cardiomegaly: Secondary | ICD-10-CM | POA: Insufficient documentation

## 2015-01-12 DIAGNOSIS — R918 Other nonspecific abnormal finding of lung field: Secondary | ICD-10-CM | POA: Insufficient documentation

## 2015-01-12 MED ORDER — IOHEXOL 350 MG/ML SOLN
100.0000 mL | Freq: Once | INTRAVENOUS | Status: AC | PRN
  Administered 2015-01-12: 100 mL via INTRAVENOUS

## 2015-01-14 ENCOUNTER — Other Ambulatory Visit: Payer: Self-pay

## 2015-01-14 ENCOUNTER — Encounter: Payer: Self-pay | Admitting: Hematology and Oncology

## 2015-01-14 ENCOUNTER — Inpatient Hospital Stay (HOSPITAL_BASED_OUTPATIENT_CLINIC_OR_DEPARTMENT_OTHER): Payer: Medicare HMO | Admitting: Hematology and Oncology

## 2015-01-14 ENCOUNTER — Inpatient Hospital Stay: Payer: Medicare HMO | Attending: Hematology and Oncology

## 2015-01-14 VITALS — BP 150/89 | HR 79 | Temp 96.7°F | Resp 18 | Ht 69.0 in | Wt 187.4 lb

## 2015-01-14 DIAGNOSIS — I517 Cardiomegaly: Secondary | ICD-10-CM | POA: Diagnosis not present

## 2015-01-14 DIAGNOSIS — M7989 Other specified soft tissue disorders: Secondary | ICD-10-CM

## 2015-01-14 DIAGNOSIS — R0601 Orthopnea: Secondary | ICD-10-CM | POA: Insufficient documentation

## 2015-01-14 DIAGNOSIS — J029 Acute pharyngitis, unspecified: Secondary | ICD-10-CM

## 2015-01-14 DIAGNOSIS — I2510 Atherosclerotic heart disease of native coronary artery without angina pectoris: Secondary | ICD-10-CM

## 2015-01-14 DIAGNOSIS — G620 Drug-induced polyneuropathy: Secondary | ICD-10-CM | POA: Diagnosis not present

## 2015-01-14 DIAGNOSIS — R161 Splenomegaly, not elsewhere classified: Secondary | ICD-10-CM | POA: Insufficient documentation

## 2015-01-14 DIAGNOSIS — R918 Other nonspecific abnormal finding of lung field: Secondary | ICD-10-CM

## 2015-01-14 DIAGNOSIS — C189 Malignant neoplasm of colon, unspecified: Secondary | ICD-10-CM | POA: Diagnosis not present

## 2015-01-14 DIAGNOSIS — Z862 Personal history of diseases of the blood and blood-forming organs and certain disorders involving the immune mechanism: Secondary | ICD-10-CM

## 2015-01-14 DIAGNOSIS — I10 Essential (primary) hypertension: Secondary | ICD-10-CM | POA: Insufficient documentation

## 2015-01-14 DIAGNOSIS — C187 Malignant neoplasm of sigmoid colon: Secondary | ICD-10-CM

## 2015-01-14 LAB — CBC WITH DIFFERENTIAL/PLATELET
Basophils Absolute: 0 10*3/uL (ref 0–0.1)
Basophils Relative: 1 %
Eosinophils Absolute: 0.8 10*3/uL — ABNORMAL HIGH (ref 0–0.7)
Eosinophils Relative: 16 %
HCT: 38.5 % — ABNORMAL LOW (ref 40.0–52.0)
Hemoglobin: 12.7 g/dL — ABNORMAL LOW (ref 13.0–18.0)
Lymphocytes Relative: 12 %
Lymphs Abs: 0.6 10*3/uL — ABNORMAL LOW (ref 1.0–3.6)
MCH: 31.2 pg (ref 26.0–34.0)
MCHC: 33 g/dL (ref 32.0–36.0)
MCV: 94.5 fL (ref 80.0–100.0)
Monocytes Absolute: 0.6 10*3/uL (ref 0.2–1.0)
Monocytes Relative: 11 %
Neutro Abs: 3.2 10*3/uL (ref 1.4–6.5)
Neutrophils Relative %: 60 %
Platelets: 182 10*3/uL (ref 150–440)
RBC: 4.07 MIL/uL — ABNORMAL LOW (ref 4.40–5.90)
RDW: 15.8 % — ABNORMAL HIGH (ref 11.5–14.5)
WBC: 5.3 10*3/uL (ref 3.8–10.6)

## 2015-01-14 LAB — COMPREHENSIVE METABOLIC PANEL
ALT: 42 U/L (ref 17–63)
AST: 35 U/L (ref 15–41)
Albumin: 3.1 g/dL — ABNORMAL LOW (ref 3.5–5.0)
Alkaline Phosphatase: 106 U/L (ref 38–126)
Anion gap: 5 (ref 5–15)
BUN: 18 mg/dL (ref 6–20)
CO2: 26 mmol/L (ref 22–32)
Calcium: 8.3 mg/dL — ABNORMAL LOW (ref 8.9–10.3)
Chloride: 109 mmol/L (ref 101–111)
Creatinine, Ser: 1.21 mg/dL (ref 0.61–1.24)
GFR calc Af Amer: >60 mL/min (ref >60)
GFR calc non Af Amer: 60 mL/min — ABNORMAL LOW (ref >60)
Glucose, Bld: 130 mg/dL — ABNORMAL HIGH (ref 65–99)
Potassium: 4.1 mmol/L (ref 3.5–5.1)
Sodium: 140 mmol/L (ref 135–145)
Total Bilirubin: 0.9 mg/dL (ref 0.3–1.2)
Total Protein: 6.5 g/dL (ref 6.5–8.1)

## 2015-01-14 MED ORDER — GABAPENTIN 100 MG PO CAPS: 200.0000 mg | ORAL_CAPSULE | Freq: Three times a day (TID) | ORAL | Status: DC

## 2015-01-14 NOTE — Progress Notes (Signed)
Patient is here for follow-up of colon cancer and CT scan results. Patient states that he has been battling a cold for about three weeks now. Patient states that he has been coughing a lot especially at night and sometimes he coughs up some yellowish discharge. Patient has been out of his medications for about 4 months now for because he his PCP, Dr. Burt Ek, retired. Patient is still having neuropathy in his hands and feet.

## 2015-01-14 NOTE — Progress Notes (Signed)
[facility] day:  01/14/2015   Chief Complaint: [name]. is a 69 y.o. male with a history of stage IIIC colon cancer who is seen for 4 month assessment.  HPI: The patient was last seen in the medical oncology clinic on 09/16/2014.  At that time, he was doing well.  He only noted a residual oxaliplatin neuropathy.  Exam was unremarkable.  CBC revealed a hematocrit of 41.7, hemoglobin 14.1, and MCV 89.9.  Ferritin was 27.  CEA was 1.5.  Per prior plan, he underwent on chest, abdomen, and pelvic CT scan on 01/12/2015.  There was no evidence of metastatic disease in the chest, abdomen or pelvis.  There was a solitary 4 mm right upper lobe pulmonary nodule (stable).  There was no evidence of local tumor recurrence status post distal colectomy.  There was mild cardiomegaly (increased).  There was mild patchy ground-glass opacity and interlobular septal thickening throughout both lungs, suggestive of pulmonary edema.  Findings were concerning for developing congestive heart failure.  There was stable dilated main pulmonary artery, suggesting pulmonary arterial hypertension.  There was left main and 3 vessel coronary atherosclerosis.  There was cholelithiasis.  During the interim, he notes a cold for 3 weeks. He describes having a runny nose and sore throat. He notes feeling overall "good". He notes his bowels are good. He denies any melena or hematochezia. He continues to have numbness in his fingers and toes.   He notes some lower extremity swelling and orthopnea.   Past Medical History  Diagnosis Date  . Hypertension   . Cancer [facility])     Colon  . Anemia     Past Surgical History  Procedure Laterality Date  . Portacath placement Left   . Colon surgery      Colectomy  . Port-a-cath removal N/A 07/12/2014    Procedure: REMOVAL PORT-A-CATH;  Surgeon: Molly Maduro, MD;  Location: [facility] ORS;  Service: General;  Laterality: N/A;    No family  history on file.  Social History:  reports that he has never smoked. He has never used smokeless tobacco. He reports that he does not drink alcohol or use illicit drugs.  [place] the city.  The patient is accompanied by his wife today.  Allergies:  Allergies  Allergen Reactions  . Shellfish Allergy Other (See Comments)    Pt. instructed by MD to avoid seafood    Current Medications: Current Outpatient Prescriptions  Medication Sig Dispense Refill  . furosemide (LASIX) 20 MG tablet Take by mouth.    . gabapentin (NEURONTIN) 100 MG capsule Take 2 capsules (200 mg total) by mouth 3 (three) times daily. 180 capsule 0  . lisinopril (PRINIVIL,ZESTRIL) 20 MG tablet Take 20 mg by mouth daily.    . Potassium Bicarb-Citric Acid 20 MEQ TBEF Take 20 mEq by mouth daily.     No current facility-administered medications for this visit.    Review of Systems:  GENERAL:  Feels good. No fevers, sweats or weight loss. PERFORMANCE STATUS (ECOG):  0 HEENT:  No visual changes, runny nose, sore throat, mouth sores or tenderness. Lungs: Cough at night.  No shortness of breath.  No hemoptysis. Cardiac:  No chest pain, palpitations, orthopnea, or PND. GI:  No nausea, vomiting, diarrhea, constipation, melena or hematochezia. GU:  No urgency, frequency, dysuria, or hematuria. Musculoskeletal:  No back pain.  No joint pain.  No muscle tenderness. Extremities:  No pain or swelling.  Skin:  No rashes or skin changes. Neuro:  Neuropathy in fingers and toes.  No headache, numbness or weakness, balance or coordination issues. Endocrine:  No diabetes, thyroid issues, hot flashes or night sweats. Psych:  No mood changes, depression or anxiety. Pain:  No focal pain. Review of systems:  All other systems reviewed and found to be negative.  Physical Exam: Blood pressure 150/89, pulse 79, temperature 96.7 F (35.9 C), temperature source Tympanic, resp. rate 18, height 5\' 9"  (1.753 m), weight 187 lb 6.3 oz  (85 kg). GENERAL:  Well developed, well nourished, sitting comfortably in the exam room in no acute distress. MENTAL STATUS:  Alert and oriented to person, place and time. HEAD:  Short hair.  Normocephalic, atraumatic, face symmetric, no Cushingoid features. EYES:  Brown eyes.  Pupils equal round and reactive to light and accomodation.  No conjunctivitis or scleral icterus. ENT:  Wearing a mask.  Oropharynx clear without lesion.  Poor dentition.  Tongue normal. Mucous membranes moist.  RESPIRATORY:  Clear to auscultation without rales, wheezes or rhonchi. CARDIOVASCULAR:  Regular rate and rhythm with soft murmur.  No rub or gallop. No JVD. ABDOMEN:  Soft, non-tender, with active bowel sounds, and no hepatosplenomegaly.  No masses. SKIN:  No rashes, ulcers or lesions. EXTREMITIES: Chronic dense lower extremity edema.  No skin discoloration or tenderness.  No palpable cords. LYMPH NODES: No palpable cervical, supraclavicular, axillary or inguinal adenopathy  NEUROLOGICAL: Unremarkable. PSYCH:  Appropriate.  Appointment on 01/14/2015  Component Date Value Ref Range Status  . WBC 01/14/2015 5.3  3.8 - 10.6 K/uL Final  . RBC 01/14/2015 4.07* 4.40 - 5.90 MIL/uL Final  . Hemoglobin 01/14/2015 12.7* 13.0 - 18.0 g/dL Final  . HCT 01/14/2015 38.5* 40.0 - 52.0 % Final  . MCV 01/14/2015 94.5  80.0 - 100.0 fL Final  . MCH 01/14/2015 31.2  26.0 - 34.0 pg Final  . MCHC 01/14/2015 33.0  32.0 - 36.0 g/dL Final  . RDW 01/14/2015 15.8* 11.5 - 14.5 % Final  . Platelets 01/14/2015 182  150 - 440 K/uL Final  . Neutrophils Relative % 01/14/2015 60   Final  . Neutro Abs 01/14/2015 3.2  1.4 - 6.5 K/uL Final  . Lymphocytes Relative 01/14/2015 12   Final  . Lymphs Abs 01/14/2015 0.6* 1.0 - 3.6 K/uL Final  . Monocytes Relative 01/14/2015 11   Final  . Monocytes Absolute 01/14/2015 0.6  0.2 - 1.0 K/uL Final  . Eosinophils Relative 01/14/2015 16   Final  . Eosinophils Absolute 01/14/2015 0.8* 0 - 0.7 K/uL Final   . Basophils Relative 01/14/2015 1   Final  . Basophils Absolute 01/14/2015 0.0  0 - 0.1 K/uL Final    Assessment:  [name]. is a 69 y.o. male with a stage IIIC colon cancer. He presented with symptomatic anemia.  He underwent descending and proximal sigmoid colectomy, left ureteral lysis, and partial cecectomy on 06/23/2013. Six of 22 lymph nodes were positive.  There were tumor deposits (discontinuous extramural extension).  There was lymphovascular and perineural invasion. Pathologic stage was IIIC (T4bN2a M0).  He received FOLFOX chemotherapy from 07/22/2013 until 01/06/2014.   Chest, abdomen, and pelvic CT scan on 01/11/2014 revealed no evidence of metastatic disease. He was noted to have stable splenomegaly. There was some progression of tree-in-bud changes in the lungs, questionable MAC infection. There were chronic inflammatory changes.  Chest, abdomen, and pelvic CT scan on 01/12/2015 revealed no evidence of metastatic disease in the chest, abdomen  or pelvis.  There was a solitary 4 mm right upper lobe pulmonary nodule (stable). There was mild cardiomegaly (increased).  There was mild patchy ground-glass opacity and interlobular septal thickening throughout both lungs, suggestive of pulmonary edema, concerning for developing congestive heart failure.  There was stable dilated main pulmonary artery, suggesting pulmonary arterial hypertension.  There was left main and 3 vessel coronary atherosclerosis.  Colonoscopy on 04/12/2014 revealed several polyps.  There was no dysplasia or malignancy. Lower extremity duplex on 06/15/2014 revealed no evidence of DVT.  Symptomatically, he has a residual neuropathy which has gotten worse off Neurontin.  He has had cold-like symptoms for a week.  Exam is unremarkable.  Plan: 1.  Labs today: CBC with diff, CMP, CEA.  2.  Review imaging studies with patient- done.  Discuss no evidence of metastatic disease, but with findings suggestive of pulmonary  edema and CHF. 3.  Refill Neurontin. 4.  Assist with appointment for PCP re: pulmonary edema. 5.  RTC in 4 months for MD assessment and labs (CBC with diff, CMP, CEA).   Lequita Asal, MD  01/14/2015, 3:27 PM

## 2015-01-14 NOTE — Progress Notes (Signed)
[place] day:  09/16/2014  Chief Complaint: [name]. is a 69 y.o. male with a history of stage IIIC colon cancer who is seen for reassessment.  HPI: The patient was last seen in the medical oncology clinic on 05/27/2014.  At that time, he was seen for initial assessment by me.  Symptomatically, he noted lower extremity edema for 3 months. He had a residual neuropathy and was on Neurontin.  CEA was 1.3.  He underwent lower extremity duplex on 06/15/2014.  There was no evidence of DVT.  We discussed port removal.  During the interim, he only notes a residual neuropathy.  He is taking Neurontin 3 times a day.  He denies any abdominal symptoms.  His port was removed on 07/12/2014.  Past Medical History  Diagnosis Date  . Hypertension   . Cancer [facility])     Colon  . Anemia     Past Surgical History  Procedure Laterality Date  . Portacath placement Left   . Colon surgery      Colectomy  . Port-a-cath removal N/A 07/12/2014    Procedure: REMOVAL PORT-A-CATH;  Surgeon: Molly Maduro, MD;  Location: [facility] ORS;  Service: General;  Laterality: N/A;    No family history on file.  Social History:  reports that he has never smoked. He has never used smokeless tobacco. He reports that he does not drink alcohol or use illicit drugs.  The patient is alone today.  Allergies:  Allergies  Allergen Reactions  . Shellfish Allergy Other (See Comments)    Pt. instructed by MD to avoid seafood    Current Medications: Current Outpatient Prescriptions  Medication Sig Dispense Refill  . lisinopril (PRINIVIL,ZESTRIL) 20 MG tablet Take 20 mg by mouth daily.    . Potassium Bicarb-Citric Acid (EFFER-K) 20 MEQ TBEF Take by mouth.    . furosemide (LASIX) 20 MG tablet Take by mouth.    . gabapentin (NEURONTIN) 100 MG capsule Take 2 capsules (200 mg total) by mouth 3 (three) times daily. 180 capsule 0  . Potassium Bicarb-Citric Acid 20 MEQ TBEF Take 20  mEq by mouth daily.     No current facility-administered medications for this visit.    Review of Systems:  GENERAL:  Feels "alright".  Active.  No fevers, sweats or weight loss. PERFORMANCE STATUS (ECOG):  0 HEENT:  No visual changes, runny nose, sore throat, mouth sores or tenderness. Lungs: No shortness of breath or cough.  No hemoptysis. Cardiac:  No chest pain, palpitations, orthopnea, or PND. GI:  No nausea, vomiting, diarrhea, constipation, melena or hematochezia. GU:  No urgency, frequency, dysuria, or hematuria. Musculoskeletal:  No back pain.  No joint pain.  No muscle tenderness. Extremities:  No pain or swelling. Skin:  No rashes or skin changes. Neuro:  Neuropathy (stable).  No headache, weakness, balance or coordination issues. Endocrine:  No diabetes, thyroid issues, hot flashes or night sweats. Psych:  No mood changes, depression or anxiety. Pain:  No focal pain. Review of systems:  All other systems reviewed and found to be negative.  Physical Exam: Blood pressure 138/75, pulse 73, temperature 96.2 F (35.7 C), temperature source Tympanic, resp. rate 18, height 5' 9"  (1.753 m), weight 171 lb 11.8 oz (77.9 kg). GENERAL:  Well developed, well nourished, sitting comfortably in the exam room in no acute distress. MENTAL STATUS:  Alert and oriented to person, place and time. HEAD:  Short hair.  Normocephalic, atraumatic, face symmetric,  no Cushingoid features. EYES:  Brown eyes.  Pupils equal round and reactive to light and accomodation.  No conjunctivitis or scleral icterus. ENT:  Oropharynx clear without lesion.  Tongue normal. Mucous membranes moist.  RESPIRATORY:  Clear to auscultation without rales, wheezes or rhonchi. CARDIOVASCULAR:  Regular rate and rhythm without murmur, rub or gallop. ABDOMEN:  Soft, non-tender, with active bowel sounds, and no hepatosplenomegaly.  No masses. SKIN:  No rashes, ulcers or lesions. EXTREMITIES: No edema, no skin discoloration or  tenderness.  No palpable cords. LYMPH NODES: No palpable cervical, supraclavicular, axillary or inguinal adenopathy  NEUROLOGICAL: Unremarkable. PSYCH:  Appropriate.  Appointment on 09/16/2014  Component Date Value Ref Range Status  . CEA 09/16/2014 1.5  0.0 - 4.7 ng/mL Final   Comment: (NOTE)       Roche ECLIA methodology       Nonsmokers  <3.9                                     Smokers     <5.6 Performed At: [facility] [place], [place] [id number] Lindon Romp MD SF:[id number]   . WBC 09/16/2014 3.4* 3.8 - 10.6 K/uL Final  . RBC 09/16/2014 4.64  4.40 - 5.90 MIL/uL Final  . Hemoglobin 09/16/2014 14.1  13.0 - 18.0 g/dL Final  . HCT 09/16/2014 41.7  40.0 - 52.0 % Final  . MCV 09/16/2014 89.9  80.0 - 100.0 fL Final  . MCH 09/16/2014 30.4  26.0 - 34.0 pg Final  . MCHC 09/16/2014 33.7  32.0 - 36.0 g/dL Final  . RDW 09/16/2014 18.1* 11.5 - 14.5 % Final  . Platelets 09/16/2014 165  150 - 440 K/uL Final  . Neutrophils Relative % 09/16/2014 66   Final  . Neutro Abs 09/16/2014 2.2  1.4 - 6.5 K/uL Final  . Lymphocytes Relative 09/16/2014 19   Final  . Lymphs Abs 09/16/2014 0.7* 1.0 - 3.6 K/uL Final  . Monocytes Relative 09/16/2014 12   Final  . Monocytes Absolute 09/16/2014 0.4  0.2 - 1.0 K/uL Final  . Eosinophils Relative 09/16/2014 2   Final  . Eosinophils Absolute 09/16/2014 0.1  0 - 0.7 K/uL Final  . Basophils Relative 09/16/2014 1   Final  . Basophils Absolute 09/16/2014 0.0  0 - 0.1 K/uL Final  . Sodium 09/16/2014 137  135 - 145 mmol/L Final  . Potassium 09/16/2014 4.2  3.5 - 5.1 mmol/L Final  . Chloride 09/16/2014 106  101 - 111 mmol/L Final  . CO2 09/16/2014 25  22 - 32 mmol/L Final  . Glucose, Bld 09/16/2014 197* 65 - 99 mg/dL Final  . BUN 09/16/2014 14  6 - 20 mg/dL Final  . Creatinine, Ser 09/16/2014 1.32* 0.61 - 1.24 mg/dL Final  . Calcium 09/16/2014 8.8* 8.9 - 10.3 mg/dL Final  . Total Protein 09/16/2014 7.5  6.5 - 8.1 g/dL Final  . Albumin  09/16/2014 3.9  3.5 - 5.0 g/dL Final  . AST 09/16/2014 22  15 - 41 U/L Final  . ALT 09/16/2014 16* 17 - 63 U/L Final  . Alkaline Phosphatase 09/16/2014 85  38 - 126 U/L Final  . Total Bilirubin 09/16/2014 0.8  0.3 - 1.2 mg/dL Final  . GFR calc non Af Amer 09/16/2014 54* >60 mL/min Final  . GFR calc Af Amer 09/16/2014 >60  >60 mL/min Final   Comment: (NOTE) The eGFR has been  calculated using the CKD EPI equation. This calculation has not been validated in all clinical situations. eGFR's persistently <60 mL/min signify possible Chronic Kidney Disease.   . Anion gap 09/16/2014 6  5 - 15 Final  . Ferritin 09/16/2014 27  24 - 336 ng/mL Final  . Iron 09/16/2014 70  45 - 182 ug/dL Final  . TIBC 09/16/2014 340  250 - 450 ug/dL Final  . Saturation Ratios 09/16/2014 21  17.9 - 39.5 % Final  . UIBC 09/16/2014 270   Final  . PT 09/16/2014 14.2* 11.9 - 14.1 sec Final  . PT 1:1NP 09/16/2014 13.7  11.9 - 14.1 sec Final  . 1 HR INCUB PT 1:1NP 09/16/2014 13.0  11.9 - 14.1 sec Final   Comment: (NOTE) Performed At: [facility] [place], [place] [id number] Lindon Romp MD NO:[id number]   . Prothrombin Time 09/16/2014 13.4  11.4 - 15.0 seconds Final  . INR 09/16/2014 1.00   Final    Assessment:  [name]. is a 69 y.o. male with a stage IIIC colon cancer. He presented with symptomatic anemia.  He underwent descending and proximal sigmoid colectomy, left ureteral lysis, and partial cecectomy on 06/23/2013. Six of 22 lymph nodes were positive.  There were tumor deposits (discontinuous extramural extension).  There was lymphovascular and perineural invasion. Pathologic stage was IIIC (T4bN2a M0).  He received FOLFOX chemotherapy from 07/22/2013 until 01/06/2014.   Chest, abdomen, and pelvic CT scan on 01/11/2014 revealed no evidence of metastatic disease. He had stable splenomegaly. There was some progression of tree-in-bud changes in the lungs, questionable MAC infection.  There were chronic inflammatory changes.  Colonoscopy on 04/12/2014 revealed several polyps.  There was no dysplasia or malignancy.  Symptomatically, he is doing well. He has a residual neuropathy and is on Neurontin.  Exam is unremarkable. He does not have a primary care physician.  Plan: 1.  Labs today: CBC with diff, CMP, CEA, ferritin, iron studies.  2.  Discuss need for primary care physician. 3.  Schedule chest, abdomen, pelvic CT on 01/12/2015. 4.  Rx:  Neurontin 200 mg po TID; dis: #180. 5.  RTC after CT scan for MD assessment and labs (CBC with diff, CMP, CEA)   Lequita Asal, MD  09/16/2014

## 2015-01-15 ENCOUNTER — Emergency Department
Admission: EM | Admit: 2015-01-15 | Discharge: 2015-01-15 | Disposition: A | Discharge status: Home / Self Care | Payer: Medicare HMO | Attending: Emergency Medicine | Admitting: Emergency Medicine

## 2015-01-15 ENCOUNTER — Emergency Department: Payer: Medicare HMO

## 2015-01-15 ENCOUNTER — Encounter: Payer: Self-pay | Admitting: Emergency Medicine

## 2015-01-15 DIAGNOSIS — J209 Acute bronchitis, unspecified: Secondary | ICD-10-CM | POA: Diagnosis not present

## 2015-01-15 DIAGNOSIS — Z79899 Other long term (current) drug therapy: Secondary | ICD-10-CM | POA: Insufficient documentation

## 2015-01-15 DIAGNOSIS — I10 Essential (primary) hypertension: Secondary | ICD-10-CM | POA: Diagnosis not present

## 2015-01-15 DIAGNOSIS — J069 Acute upper respiratory infection, unspecified: Secondary | ICD-10-CM

## 2015-01-15 DIAGNOSIS — R0981 Nasal congestion: Secondary | ICD-10-CM | POA: Diagnosis present

## 2015-01-15 LAB — CEA: CEA: 1.1 ng/mL (ref 0.0–4.7)

## 2015-01-15 MED ORDER — CHLORPHENIRAMINE MALEATE 4 MG PO TABS: 4.0000 mg | ORAL_TABLET | Freq: Two times a day (BID) | ORAL | Status: DC | PRN

## 2015-01-15 MED ORDER — GUAIFENESIN-CODEINE 100-10 MG/5ML PO SOLN: 10.0000 mL | ORAL | Status: DC | PRN

## 2015-01-15 MED ORDER — AZITHROMYCIN 250 MG PO TABS: ORAL_TABLET | ORAL | Status: DC

## 2015-01-15 NOTE — Discharge Instructions (Signed)

## 2015-01-15 NOTE — ED Notes (Signed)
NAD noted at time of D/C. Pt denies questions or concerns. Pt ambulatory to the lobby at this time.  

## 2015-01-15 NOTE — ED Provider Notes (Signed)
[facility] Emergency Department Provider Note  ____________________________________________  Time seen: Approximately 5:42 PM  I have reviewed the triage vital signs and the nursing notes.   HISTORY  Chief Complaint Nasal Congestion and Cough   HPI [name]. is a 69 y.o. male who presents for evaluation of cough congestion 1 week. Patient states no fever or chills denies any sinus congestion. Denies sore throat.   Past Medical History  Diagnosis Date  . Hypertension   . Cancer [facility])     Colon  . Anemia     Patient Active Problem List   Diagnosis Date Noted  . Colon cancer ([place]) 09/16/2014  . Neuropathy ([place]) 09/16/2014  . Tubular adenoma of colon 07/20/2014  . Malignant neoplasm of sigmoid colon ([place]) 07/20/2014  . Benign colon polyp 07/20/2014  . Dia hypertension 07/20/2014    Past Surgical History  Procedure Laterality Date  . Portacath placement Left   . Colon surgery      Colectomy  . Port-a-cath removal N/A 07/12/2014    Procedure: REMOVAL PORT-A-CATH;  Surgeon: Molly Maduro, MD;  Location: [facility] ORS;  Service: General;  Laterality: N/A;    Current Outpatient Rx  Name  Route  Sig  Dispense  Refill  . azithromycin (ZITHROMAX Z-PAK) 250 MG tablet      Take 2 tablets (500 mg) on  Day 1,  followed by 1 tablet (250 mg) once daily on Days 2 through 5.   6 each   0   . chlorpheniramine (CHLOR-TRIMETON) 4 MG tablet   Oral   Take 1 tablet (4 mg total) by mouth 2 (two) times daily as needed for allergies or rhinitis.   30 tablet   0   . furosemide (LASIX) 20 MG tablet   Oral   Take by mouth.         . gabapentin (NEURONTIN) 100 MG capsule   Oral   Take 2 capsules (200 mg total) by mouth 3 (three) times daily.   180 capsule   0   . guaiFENesin-codeine 100-10 MG/5ML syrup   Oral   Take 10 mLs by mouth every 4 (four) hours as needed for cough.   180 mL   0   . lisinopril (PRINIVIL,ZESTRIL) 20 MG tablet   Oral  Take 20 mg by mouth daily.         . Potassium Bicarb-Citric Acid 20 MEQ TBEF   Oral   Take 20 mEq by mouth daily.           Allergies Shellfish allergy  History reviewed. No pertinent family history.  Social History Social History  Substance Use Topics  . Smoking status: Never Smoker   . Smokeless tobacco: Never Used  . Alcohol Use: No    Review of Systems Constitutional: No fever/chills Eyes: No visual changes. ENT: No sore throat. Cardiovascular: Denies chest pain. Respiratory: Denies shortness of breath. Positive for productive cough. Gastrointestinal: No abdominal pain.  No nausea, no vomiting.  No diarrhea.  No constipation. Genitourinary: Negative for dysuria. Musculoskeletal: Negative for back pain. Skin: Negative for rash. Neurological: Negative for headaches, focal weakness or numbness.  10-point ROS otherwise negative.  ____________________________________________   PHYSICAL EXAM:  VITAL SIGNS: ED Triage Vitals  Enc Vitals Group     BP 01/15/15 1603 143/85 mmHg     Pulse Rate 01/15/15 1603 77     Resp 01/15/15 1603 20     Temp 01/15/15 1603 98.9 F (37.2 C)  Temp Source 01/15/15 1603 Oral     SpO2 01/15/15 1603 98 %     Weight 01/15/15 1603 185 lb (83.915 kg)     Height 01/15/15 1603 5\' 9"  (1.753 m)     Head Cir --      Peak Flow --      Pain Score --      Pain Loc --      Pain Edu? --      Excl. in [place]? --     Constitutional: Alert and oriented. Well appearing and in no acute distress. Eyes: Conjunctivae are normal. PERRL. EOMI. Head: Atraumatic. Nose: No congestion/rhinnorhea. Mouth/Throat: Mucous membranes are moist.  Oropharynx non-erythematous. Neck: No stridor.   Cardiovascular: Normal rate, regular rhythm. Grossly normal heart sounds.  Good peripheral circulation. Respiratory: Normal respiratory effort.  No retractions. Lungs CTAB. Coarse breath sounds noted but no wheezing. Musculoskeletal: No lower extremity tenderness nor  edema.  No joint effusions. Neurologic:  Normal speech and language. No gross focal neurologic deficits are appreciated. No gait instability. Skin:  Skin is warm, dry and intact. No rash noted. Psychiatric: Mood and affect are normal. Speech and behavior are normal.  ____________________________________________   LABS (all labs ordered are listed, but only abnormal results are displayed)  Labs Reviewed - No data to display ____________________________________________  RADIOLOGY  Negative for pneumonia. ____________________________________________   PROCEDURES  Procedure(s) performed: None  Critical Care performed: No  ____________________________________________   INITIAL IMPRESSION / ASSESSMENT AND PLAN / ED COURSE  Pertinent labs & imaging results that were available during my care of the patient were reviewed by me and considered in my medical decision making (see chart for details).  Acute upper respiratory infection and bronchitis. Rx given for Zithromax pack, Robitussin-AC, and chlorpheniramine as needed for drainage. She voices no other emergency medical complaints at this visit. ____________________________________________   FINAL CLINICAL IMPRESSION(S) / ED DIAGNOSES  Final diagnoses:  URI, acute  Acute bronchitis, unspecified organism      Arlyss Repress, PA-C 01/15/15 2148  Earleen Newport, MD 01/15/15 2209

## 2015-02-10 ENCOUNTER — Encounter: Payer: Self-pay | Admitting: Family Medicine

## 2015-02-10 ENCOUNTER — Ambulatory Visit
Admission: RE | Admit: 2015-02-10 | Discharge: 2015-02-10 | Disposition: A | Discharge status: Home / Self Care | Payer: Medicare HMO | Source: Ambulatory Visit | Attending: Family Medicine | Admitting: Family Medicine

## 2015-02-10 ENCOUNTER — Telehealth: Payer: Self-pay | Admitting: Family Medicine

## 2015-02-10 ENCOUNTER — Ambulatory Visit (INDEPENDENT_AMBULATORY_CARE_PROVIDER_SITE_OTHER): Payer: Medicare HMO | Admitting: Family Medicine

## 2015-02-10 VITALS — BP 136/94 | HR 77 | Temp 98.1°F | Ht 69.0 in | Wt 189.8 lb

## 2015-02-10 DIAGNOSIS — R05 Cough: Secondary | ICD-10-CM

## 2015-02-10 DIAGNOSIS — R600 Localized edema: Secondary | ICD-10-CM

## 2015-02-10 DIAGNOSIS — R059 Cough, unspecified: Secondary | ICD-10-CM | POA: Insufficient documentation

## 2015-02-10 DIAGNOSIS — G629 Polyneuropathy, unspecified: Secondary | ICD-10-CM

## 2015-02-10 DIAGNOSIS — R0602 Shortness of breath: Secondary | ICD-10-CM

## 2015-02-10 DIAGNOSIS — I10 Essential (primary) hypertension: Secondary | ICD-10-CM

## 2015-02-10 DIAGNOSIS — R918 Other nonspecific abnormal finding of lung field: Secondary | ICD-10-CM | POA: Insufficient documentation

## 2015-02-10 DIAGNOSIS — I517 Cardiomegaly: Secondary | ICD-10-CM | POA: Insufficient documentation

## 2015-02-10 DIAGNOSIS — R109 Unspecified abdominal pain: Secondary | ICD-10-CM

## 2015-02-10 LAB — CBC
HCT: 38.8 % — ABNORMAL LOW (ref 39.0–52.0)
HEMOGLOBIN: 12.4 g/dL — AB (ref 13.0–17.0)
MCHC: 32 g/dL (ref 30.0–36.0)
MCV: 94.8 fl (ref 78.0–100.0)
PLATELETS: 175 10*3/uL (ref 150.0–400.0)
RBC: 4.1 Mil/uL — ABNORMAL LOW (ref 4.22–5.81)
RDW: 16.2 % — ABNORMAL HIGH (ref 11.5–15.5)
WBC: 4.9 10*3/uL (ref 4.0–10.5)

## 2015-02-10 LAB — COMPREHENSIVE METABOLIC PANEL
ALBUMIN: 3.3 g/dL — AB (ref 3.5–5.2)
ALK PHOS: 101 U/L (ref 39–117)
ALT: 36 U/L (ref 0–53)
AST: 27 U/L (ref 0–37)
BUN: 22 mg/dL (ref 6–23)
CO2: 27 mEq/L (ref 19–32)
Calcium: 9.2 mg/dL (ref 8.4–10.5)
Chloride: 111 mEq/L (ref 96–112)
Creatinine, Ser: 1.34 mg/dL (ref 0.40–1.50)
GFR: 68.15 mL/min (ref >60.00)
Glucose, Bld: 116 mg/dL — ABNORMAL HIGH (ref 70–99)
POTASSIUM: 4.1 meq/L (ref 3.5–5.1)
Sodium: 143 mEq/L (ref 135–145)
TOTAL PROTEIN: 6.5 g/dL (ref 6.0–8.3)
Total Bilirubin: 1.1 mg/dL (ref 0.2–1.2)

## 2015-02-10 LAB — TSH: TSH: 1.98 u[IU]/mL (ref 0.35–4.50)

## 2015-02-10 LAB — BRAIN NATRIURETIC PEPTIDE: Pro B Natriuretic peptide (BNP): 2826 pg/mL — ABNORMAL HIGH (ref 0.0–100.0)

## 2015-02-10 MED ORDER — LISINOPRIL 10 MG PO TABS: 10.0000 mg | ORAL_TABLET | Freq: Every day | ORAL | Status: DC

## 2015-02-10 NOTE — Assessment & Plan Note (Signed)
Diastolic blood pressure is elevated today. Has not been on medications in some time. We will check kidney function and electrolytes and then restart on lisinopril 10 mg daily.

## 2015-02-10 NOTE — Progress Notes (Signed)
Patient ID: Christopher Collier., male   DOB: 11-13-1945, 70 y.o.   MRN: [id number]  Christopher Rumps, MD Phone: [phone]  Christopher Capozzoli [name]. is a 70 y.o. male who presents today for new patient visit.  Patient presents with a variety of issues today. Lower extremity swelling: Patient notes this is been present for at least the last 3 months, though on review of records it appears to have been present for at least the last year. Notes he had an ultrasound of his bilateral lower extremities that did not reveal a DVT. He was placed on Lasix by his oncologist per his report and was advised to take this for the swelling. Wife states that he is supposed to be wearing compression stockings as well. He did have a CT scan of his chest in December that was suggestive of heart failure and pulmonary edema related to that. He denies chest pain. He does note some shortness of breath particularly when he coughs. He endorses orthopnea and PND. No exertional shortness of breath. Notes he can do any physical activity he wants with no issues. No palpitations. He does have a history of hypertension. He has been on lisinopril 20 mg in the past though has not been on this for the last 4-5 months. He does note nighttime cough productive of green and white material. This has been going on for last 3 months. No fevers. No blood in his sputum. He did not smoke in the past. He notes postnasal drip and rhinorrhea with this as well. Patient does have a history of colon cancer status post resection in what sounds like 2015 per his report. Had a CT abdomen and pelvis in December that revealed no recurrence of his tumor and no evidence of metastatic disease. CT of his chest did reveal mild cardiomegaly and mild patchy groundglass opacity and interlobular septal thickening throughout both lungs suggestive of pulmonary edema. Also revealed stable dilated main pulmonary artery and coronary atherosclerosis. Patient additionally notes that he  intermittently feels lightheaded. This can occur at any time. Not when he is going from laying to rising. Is not exertional. It is not vertigo. He has no chest pain or shortness of breath at this time.   Neuropathy: Patient notes tingling in his hands and feet for the last 3 months. No numbness, weakness, or other neurological deficits. Notes he is on gabapentin for this. This has been started through his oncologist.  Patient does note for a number of months he has an abdominal discomfort that he describes as feeling like he is hungry that occurs in the middle of his abdomen. It occurs 1-2 times a week. Lasts roughly 15-20 minutes. Resolves when he eats. Does not occur when he is not hungry. No nausea, vomiting, or diarrhea. Had a CT scan of his abdomen and pelvis in December that is outlined above. He has no abdominal pain at this time.   Active Ambulatory Problems    Diagnosis Date Noted  . Colon cancer ([place]) 09/16/2014  . Tubular adenoma of colon 07/20/2014  . Malignant neoplasm of sigmoid colon ([place]) 07/20/2014  . Benign colon polyp 07/20/2014  . Dia hypertension 07/20/2014  . Neuropathy ([place]) 09/16/2014  . Bilateral lower extremity edema 02/10/2015  . Abdominal pain 02/10/2015  . Cough 02/10/2015   Resolved Ambulatory Problems    Diagnosis Date Noted  . No Resolved Ambulatory Problems   Past Medical History  Diagnosis Date  . Hypertension   . Cancer ([place])   .  Anemia     No family history on file.  Social History   Social History  . Marital Status: Married    Spouse Name: N/A  . Number of Children: N/A  . Years of Education: N/A   Occupational History  . Not on file.   Social History Main Topics  . Smoking status: Never Smoker   . Smokeless tobacco: Never Used  . Alcohol Use: No  . Drug Use: No  . Sexual Activity: Not on file   Other Topics Concern  . Not on file   Social History Narrative    ROS   General: Positive for unexplained weight loss, Negative  for fever Skin: Negative for new or changing mole, sore that won't heal HEENT: Negative for trouble hearing, trouble seeing, ringing in ears, mouth sores, hoarseness, change in voice, dysphagia. CV:  Positive for shortness of breath, edema Negative for chest pain, palpitations Resp: Positive for cough and dyspnea, Negative for hemoptysis GI: Positive for abdominal pain, Negative for nausea, vomiting, diarrhea, constipation,  melena, hematochezia. GU: Negative for dysuria, incontinence, urinary hesitance, hematuria, vaginal or penile discharge, polyuria, sexual difficulty, lumps in testicle or breasts MSK: Negative for muscle cramps or aches, joint pain or swelling Neuro: Positive for neuropathy and dizziness, Negative for headaches, weakness, numbness, passing out/fainting Psych: Negative for depression, anxiety, memory problems  Objective  Physical Exam Filed Vitals:   02/10/15 1315  BP: 136/94  Pulse: 77  Temp: 98.1 F (36.7 C)   Laying blood pressure 132/90 pulse 75 Sitting blood pressure 130/88 pulse 78 Standing blood pressure 134/90 pulse 77  Physical Exam  Constitutional:  No acute distress, not diaphoretic  HENT:  Head: Normocephalic and atraumatic.  Right Ear: External ear normal.  Left Ear: External ear normal.  Mouth/Throat: Oropharynx is clear and moist. No oropharyngeal exudate.  Eyes: Conjunctivae are normal. Pupils are equal, round, and reactive to light.  Neck: Neck supple.  Cardiovascular: Normal rate, regular rhythm and normal heart sounds.  Exam reveals no gallop and no friction rub.   No murmur heard. 2+ pitting edema bilateral lower extremities to the mid shin  Pulmonary/Chest: Effort normal. No respiratory distress. He has no wheezes.  Crackles noted in left lung field  Abdominal: Soft. Bowel sounds are normal. He exhibits no distension and no mass. There is no tenderness. There is no rebound and no guarding.  Musculoskeletal:  No calf tenderness or  cords  Lymphadenopathy:    He has no cervical adenopathy.  Neurological: He is alert. Gait normal.  CN 2-12 intact, 5/5 strength in bilateral biceps, triceps, grip, quads, hamstrings, plantar and dorsiflexion, sensation to light touch intact in bilateral UE and LE, normal gait, 2+ patellar reflexes  Skin: Skin is warm and dry.  Psychiatric: Mood and affect normal.   EKG: Normal sinus rhythm, rate 84, frequent PVCs, incomplete right bundle branch block, inverted T waves in leads 1, 2, aVF, V4, V5, V6, T-wave inversions are consistent with prior EKGs, frequent PVCs are new  Assessment/Plan:   Dia hypertension Diastolic blood pressure is elevated today. Has not been on medications in some time. We will check kidney function and electrolytes and then restart on lisinopril 10 mg daily.  Neuropathy ([place]) Tingling most consistent with neuropathy. Possibly related to his chemotherapy agent. He'll continue gabapentin. Neurologically intact.  Bilateral lower extremity edema Patient with bilateral lower extremity edema in conjunction with orthopnea, PND, and some shortness of breath. This is worrisome for heart failure especially given his CT  scan findings in December. On review of records it appears that he has had elevated BNPs in the past, though no apparent echo on review of the chart. EKG today was obtained to evaluate this further and it appears that the patient could've had a cardiac event in the past given T-wave inversions. His T-wave inversions are stable from previously. Is asymptomatic at this time. We will obtain a BMP to evaluate this further. We will also check a CMP, CBC, and TSH. Obtain a chest x-ray as well to evaluate for pulmonary edema. He will likely need to continue Lasix. Given his constellation of symptoms we will refer to cardiology for him to be evaluated next week. He is given return precautions.  Abdominal pain And occurs in the middle of his abdomen. Last briefly. Is  described as feeling hungry. Stops when he eats. He had a reassuring CT scan of his abdomen and pelvis in December. Pain has been going on since prior to that scan. Advised that he should continue to eat. We'll continue to monitor. If worsens or changes in his let us know. Given return precautions.  Cough Patient with several months of cough. This could be related to heart failure if he proves to have heart failure. He does have some crackles in the left lung and this could have some sort of infectious process leading to this. Could also be related to allergic rhinitis given his postnasal drip and rhinorrhea. Could potentially also be reflux. Cough is without blood and vital signs are stable in a patient with a wells score of 1 and no history of VTE making VTE an unlikely cause of his cough. We'll obtain a chest x-ray and a CBC to evaluate further. We'll also evaluate for heart failure with a BNP. If negative will consider treatment for allergic rhinitis versus evaluation with pulmonary function testing. Given return precautions.    Orders Placed This Encounter  Procedures  . DG Chest 2 View    Standing Status: Future     Number of Occurrences: 1     Standing Expiration Date: 04/09/2016    Order Specific Question:  Reason for Exam (SYMPTOM  OR DIAGNOSIS REQUIRED)    Answer:  cough 3 months, crackles left lung, mild shortness of breath    Order Specific Question:  Preferred imaging location?    Answer:  [facility] ([facility])  . CBC  . TSH  . B Nat Peptide  . Ambulatory referral to Cardiology    Referral Priority:  Urgent    Referral Type:  Consultation    Referral Reason:  Specialty Services Required    Requested Specialty:  Cardiology    Number of Visits Requested:  1  . EKG 12-Lead    Meds ordered this encounter  Medications  . lisinopril (PRINIVIL,ZESTRIL) 10 MG tablet    Sig: Take 1 tablet (10 mg total) by mouth daily.    Dispense:  90 tablet    Refill:  3     Dragon voice recognition software was used during the dictation process of this note. If any phrases or words seem inappropriate it is likely secondary to the translation process being inefficient.  [name]

## 2015-02-10 NOTE — Progress Notes (Signed)
Pre visit review using our clinic review tool, if applicable. No additional management support is needed unless otherwise documented below in the visit note. 

## 2015-02-10 NOTE — Telephone Encounter (Signed)
I attempted to call the patient to discuss his lab results. There was no answer. I left a message asking him to call back to the office as soon as he got the message. Advised that he could speak to the nurse triage service tonight when he called back.  Patient's BNP is significant elevated on check today. Given his elevated BNP and abnormal lung exam today with swelling in his legs I wanted to discuss further evaluation with the patient. I wanted to confirm that he is not having any shortness of breath at this time and to ensure that he is not having any other cardiac symptoms at this time. He was not having these symptoms while he was in the office. If he is not it is certainly reasonable to give him an oral dose of 40 mg Lasix which he should have at home to help with diuresis overnight. If he is having any further symptoms I think it would be reasonable for him to be evaluated in the emergency room tonight.

## 2015-02-10 NOTE — Assessment & Plan Note (Signed)
Patient with several months of cough. This could be related to heart failure if he proves to have heart failure. He does have some crackles in the left lung and this could have some sort of infectious process leading to this. Could also be related to allergic rhinitis given his postnasal drip and rhinorrhea. Could potentially also be reflux. Cough is without blood and vital signs are stable in a patient with a wells score of 1 and no history of VTE making VTE an unlikely cause of his cough. We'll obtain a chest x-ray and a CBC to evaluate further. We'll also evaluate for heart failure with a BNP. If negative will consider treatment for allergic rhinitis versus evaluation with pulmonary function testing. Given return precautions.

## 2015-02-10 NOTE — Assessment & Plan Note (Addendum)
Patient with bilateral lower extremity edema in conjunction with orthopnea, PND, and some shortness of breath. This is worrisome for heart failure especially given his CT scan findings in December. On review of records it appears that he has had elevated BNPs in the past, though no apparent echo on review of the chart. EKG today was obtained to evaluate this further and it appears that the patient could've had a cardiac event in the past given T-wave inversions. His T-wave inversions are stable from previously. Is asymptomatic at this time. We will obtain a BNP to evaluate this further. We will also check a CMP, CBC, and TSH. Obtain a chest x-ray as well to evaluate for pulmonary edema. He will likely need to continue Lasix. Given his constellation of symptoms we will refer to cardiology for him to be evaluated next week. He is given return precautions.

## 2015-02-10 NOTE — Patient Instructions (Signed)
Nice to meet you. Please go get the chest x-ray today. We'll get some lab work today. We will refer you to cardiology for further evaluation.  If you develop chest pain, shortness of breath, cough productive of blood, fever, abdominal pain, nausea, vomiting, diarrhea, numbness, weakness, vision changes, dizziness, or any new or changing symptoms please seek medical attention.

## 2015-02-10 NOTE — Assessment & Plan Note (Addendum)
Notes this occurs in the middle of his abdomen. Lasts briefly. Is described as feeling hungry. Stops when he eats. He had a reassuring CT scan of his abdomen and pelvis in December. Pain has been going on since prior to that scan. Advised that he should continue to eat. We'll continue to monitor. If worsens or changes in his let us know. Given return precautions.

## 2015-02-10 NOTE — Assessment & Plan Note (Signed)
Tingling most consistent with neuropathy. Possibly related to his chemotherapy agent. He'll continue gabapentin. Neurologically intact.

## 2015-02-11 ENCOUNTER — Other Ambulatory Visit: Payer: Self-pay

## 2015-02-11 ENCOUNTER — Telehealth: Payer: Self-pay | Admitting: *Deleted

## 2015-02-11 MED ORDER — FUROSEMIDE 20 MG PO TABS: 40.0000 mg | ORAL_TABLET | Freq: Every day | ORAL | Status: DC

## 2015-02-11 NOTE — Telephone Encounter (Signed)
Patient had a Xray on 02/10/15, he requested a call for results.  Please call [phone]

## 2015-02-11 NOTE — Telephone Encounter (Signed)
Can you call the patient regarding my message. Please see how he is doing. Thanks.

## 2015-02-11 NOTE — Telephone Encounter (Signed)
Please advise xray results.

## 2015-02-11 NOTE — Telephone Encounter (Signed)
Patient called back and spoke to [place]. He was placed on hold so that I could answer the phone at my desk, and when I went answer the phone there was no one on the other line. I attempted to call the patient back 2 times at the number in the chart and it said there was no voicemail set up. We'll have Roselyn Reef attempt to call the patient back.

## 2015-02-11 NOTE — Telephone Encounter (Signed)
Per Dr. Caryl Bis patient should increase Lasix to 40 MG a day through the weekend. Patient verbalized understanding. Patient is scheduled for repeat labs for 02/14/14.

## 2015-02-11 NOTE — Telephone Encounter (Signed)
Left message for patient to return call.

## 2015-02-11 NOTE — Telephone Encounter (Signed)
Spoke with patient and he stated that he did not have any cardic symptoms such as SOB or chest pain. Per Dr. Caryl Bis patient should increase Lasix

## 2015-02-14 NOTE — Telephone Encounter (Signed)
Patient was given results by Roselyn Reef last week.

## 2015-02-15 ENCOUNTER — Encounter: Payer: Self-pay | Admitting: Cardiovascular Disease

## 2015-02-15 ENCOUNTER — Telehealth: Payer: Self-pay | Admitting: Family Medicine

## 2015-02-15 ENCOUNTER — Other Ambulatory Visit: Payer: Medicare HMO

## 2015-02-15 ENCOUNTER — Ambulatory Visit (INDEPENDENT_AMBULATORY_CARE_PROVIDER_SITE_OTHER): Payer: Medicare HMO | Admitting: Cardiovascular Disease

## 2015-02-15 ENCOUNTER — Other Ambulatory Visit
Admission: RE | Admit: 2015-02-15 | Discharge: 2015-02-15 | Disposition: A | Discharge status: Home / Self Care | Payer: Medicare HMO | Source: Ambulatory Visit | Attending: Dermatopathology | Admitting: Dermatopathology

## 2015-02-15 VITALS — BP 124/74 | HR 80 | Ht 69.0 in | Wt 183.4 lb

## 2015-02-15 DIAGNOSIS — I509 Heart failure, unspecified: Secondary | ICD-10-CM | POA: Diagnosis not present

## 2015-02-15 DIAGNOSIS — R0602 Shortness of breath: Secondary | ICD-10-CM

## 2015-02-15 DIAGNOSIS — R8299 Other abnormal findings in urine: Secondary | ICD-10-CM | POA: Insufficient documentation

## 2015-02-15 DIAGNOSIS — R600 Localized edema: Secondary | ICD-10-CM

## 2015-02-15 DIAGNOSIS — I10 Essential (primary) hypertension: Secondary | ICD-10-CM | POA: Diagnosis not present

## 2015-02-15 DIAGNOSIS — R8290 Unspecified abnormal findings in urine: Secondary | ICD-10-CM

## 2015-02-15 LAB — PROTEIN / CREATININE RATIO, URINE
CREATININE, URINE: 34 mg/dL
PROTEIN CREATININE RATIO: 1.79 mg/mg{creat} — AB (ref 0.00–0.15)
TOTAL PROTEIN, URINE: 61 mg/dL

## 2015-02-15 MED ORDER — CARVEDILOL 3.125 MG PO TABS: 3.1250 mg | ORAL_TABLET | Freq: Two times a day (BID) | ORAL | Status: DC

## 2015-02-15 NOTE — Assessment & Plan Note (Signed)
The patient's presentation is highly suggestive of acute on chronic heart failure likely systolic type but he has not had an echocardiogram yesterday. There is evidence of significant fluid overload. The etiology of his heart failure is not entirely clear as his previous chemotherapy is not typically cardiotoxic. Ischemic heart disease will have to be evaluated once his fluid overloaded but is improved. I requested an echocardiogram for evaluation. He does have low albumin which raises the possibility of proteinuria and thus I requested a urine protein to creatinine ratio. Continue diuresis with furosemide. I added small dose carvedilol 3.125 mg twice daily. He is already on lisinopril.

## 2015-02-15 NOTE — Telephone Encounter (Signed)
LM for patient to return call.

## 2015-02-15 NOTE — Telephone Encounter (Signed)
Called patient as I noted he had not come in for his lab visit today. There was no answer. I left a message asking him to call the office. When he calls back he needs to be informed that he needs to come in for a lab visit.

## 2015-02-15 NOTE — Patient Instructions (Addendum)
Medication Instructions:  Your physician has recommended you make the following change in your medication:  START taking coreg 3.125mg  twice daily    Labwork: UA Protein/creatnine ratio  Testing/Procedures: Your physician has requested that you have an echocardiogram. Echocardiography is a painless test that uses sound waves to create images of your heart. It provides your doctor with information about the size and shape of your heart and how well your heart's chambers and valves are working. This procedure takes approximately one hour. There are no restrictions for this procedure.    Follow-Up: Your physician recommends that you schedule a follow-up appointment in: one month with Dr. Fletcher Anon.    Any Other Special Instructions Will Be Listed Below (If Applicable).     If you need a refill on your cardiac medications before your next appointment, please call your pharmacy.  Echocardiogram An echocardiogram, or echocardiography, uses sound waves (ultrasound) to produce an image of your heart. The echocardiogram is simple, painless, obtained within a short period of time, and offers valuable information to your health care provider. The images from an echocardiogram can provide information such as:  Evidence of coronary artery disease (CAD).  Heart size.  Heart muscle function.  Heart valve function.  Aneurysm detection.  Evidence of a past heart attack.  Fluid buildup around the heart.  Heart muscle thickening.  Assess heart valve function. LET [facility] CARE PROVIDER KNOW ABOUT:  Any allergies you have.  All medicines you are taking, including vitamins, herbs, eye drops, creams, and over-the-counter medicines.  Previous problems you or members of your family have had with the use of anesthetics.  Any blood disorders you have.  Previous surgeries you have had.  Medical conditions you have.  Possibility of pregnancy, if this applies. BEFORE THE PROCEDURE  No  special preparation is needed. Eat and drink normally.  PROCEDURE   In order to produce an image of your heart, gel will be applied to your chest and a wand-like tool (transducer) will be moved over your chest. The gel will help transmit the sound waves from the transducer. The sound waves will harmlessly bounce off your heart to allow the heart images to be captured in real-time motion. These images will then be recorded.  You may need an IV to receive a medicine that improves the quality of the pictures. AFTER THE PROCEDURE You may return to your normal schedule including diet, activities, and medicines, unless your health care provider tells you otherwise.   This information is not intended to replace advice given to you by your health care provider. Make sure you discuss any questions you have with your health care provider.   Document Released: 01/20/2000 Document Revised: 02/12/2014 Document Reviewed: 09/29/2012 Elsevier Interactive Patient Education [employer].

## 2015-02-15 NOTE — Progress Notes (Signed)
Primary care physician: Dr. Caryl Bis  HPI  This is a pleasant 70 -year-old male who was referred by Dr. Caryl Bis for evaluation of congestive heart failure. The patient has no previous cardiac history. He has known history of colon cancer status post colectomy in 2015 followed by chemotherapy with oxaliplatin and fluorouracil. He had neuropathy as a result. He has no other significant medical problems. He is not a smoker and has not had any alcohol in 10 years. He is not diabetic. Over the last few months, he has experienced progressive severe leg edema and most recently over the last few weeks he started having significant exertional dyspnea without chest discomfort. No chest pain. He developed some venous ulcers on his legs as a result of the swelling. He was seen by Dr. Caryl Bis. He had labs performed which showed mild hypoalbuminemia at 3.3, mild anemia with a hemoglobin of 12.4, normal thyroid function and significantly elevated BNP at 2800. Chest x-ray showed cardiomegaly with pulmonary vascular congestion. He was started on Lasix 40 mg once daily with gradual improvement in edema.  Allergies  Allergen Reactions  . Shellfish Allergy Other (See Comments)    Pt. instructed by MD to avoid seafood     Current Outpatient Prescriptions on File Prior to Visit  Medication Sig Dispense Refill  . chlorpheniramine (CHLOR-TRIMETON) 4 MG tablet Take 1 tablet (4 mg total) by mouth 2 (two) times daily as needed for allergies or rhinitis. 30 tablet 0  . furosemide (LASIX) 20 MG tablet Take 2 tablets (40 mg total) by mouth daily. 60 tablet 0  . gabapentin (NEURONTIN) 100 MG capsule Take 2 capsules (200 mg total) by mouth 3 (three) times daily. 180 capsule 0  . lisinopril (PRINIVIL,ZESTRIL) 10 MG tablet Take 1 tablet (10 mg total) by mouth daily. 90 tablet 3  . Potassium Bicarb-Citric Acid 20 MEQ TBEF Take 20 mEq by mouth daily.     No current facility-administered medications on file prior to visit.      Past Medical History  Diagnosis Date  . Hypertension   . Cancer [facility])     Colon  . Anemia      Past Surgical History  Procedure Laterality Date  . Portacath placement Left   . Colon surgery      Colectomy  . Port-a-cath removal N/A 07/12/2014    Procedure: REMOVAL PORT-A-CATH;  Surgeon: Molly Maduro, MD;  Location: [facility] ORS;  Service: General;  Laterality: N/A;     No family history on file.   Social History   Social History  . Marital Status: Married    Spouse Name: N/A  . Number of Children: N/A  . Years of Education: N/A   Occupational History  . Not on file.   Social History Main Topics  . Smoking status: Never Smoker   . Smokeless tobacco: Never Used  . Alcohol Use: No  . Drug Use: No  . Sexual Activity: Not on file   Other Topics Concern  . Not on file   Social History Narrative     ROS A 10 point review of system was performed. It is negative other than that mentioned in the history of present illness.   PHYSICAL EXAM   BP 124/74 mmHg  Pulse 80  Ht 5\' 9"  (1.753 m)  Wt 183 lb 6.4 oz (83.19 kg)  BMI 27.07 kg/m2 Constitutional: He is oriented to person, place, and time. He appears well-developed and well-nourished. No distress.  HENT: No nasal discharge.  Head: Normocephalic  and atraumatic.  Eyes: Pupils are equal and round.  No discharge. Neck: Normal range of motion. Neck supple. No JVD present. No thyromegaly present.  Cardiovascular: Normal rate, regular rhythm with premature beats, normal heart sounds. Exam reveals no gallop and no friction rub. No murmur heard.  Pulmonary/Chest: Effort normal and diminished breath sounds at the base. No stridor. No respiratory distress. He has no wheezes. He has no rales. He exhibits no tenderness.  Abdominal: Soft. Bowel sounds are normal. He exhibits no distension. There is no tenderness. There is no rebound and no guarding.  Musculoskeletal: Normal range of motion. He exhibits +3 edema and no  tenderness.  Neurological: He is alert and oriented to person, place, and time. Coordination normal.  Skin: Skin is warm and dry. Chronic stasis dermatitis with ulcers on the left shin area suggestive of venous ulceration.  Psychiatric: He has a normal mood and affect. His behavior is normal. Judgment and thought content normal.       EKG: Normal sinus rhythm with frequent PACs. T wave changes suggestive of lateral ischemia.   ASSESSMENT AND PLAN

## 2015-02-15 NOTE — Assessment & Plan Note (Signed)
There might be an element of chronic venous insufficiency in addition to heart failure. He does have what seems to be venous ulceration. Once the swelling is improved, he might benefit from knee-high support stockings.

## 2015-02-16 NOTE — Telephone Encounter (Signed)
Left a message for patient to return call.

## 2015-02-17 ENCOUNTER — Other Ambulatory Visit: Payer: Self-pay | Admitting: Family Medicine

## 2015-02-17 ENCOUNTER — Other Ambulatory Visit: Payer: Self-pay | Admitting: Cardiovascular Disease

## 2015-02-17 DIAGNOSIS — R600 Localized edema: Secondary | ICD-10-CM

## 2015-02-17 DIAGNOSIS — R0602 Shortness of breath: Secondary | ICD-10-CM

## 2015-02-17 DIAGNOSIS — I517 Cardiomegaly: Secondary | ICD-10-CM

## 2015-02-17 DIAGNOSIS — R809 Proteinuria, unspecified: Secondary | ICD-10-CM

## 2015-02-17 DIAGNOSIS — Z9221 Personal history of antineoplastic chemotherapy: Secondary | ICD-10-CM

## 2015-02-17 NOTE — Telephone Encounter (Signed)
Patient coming in for appointment on 02/24/15 and wants to have labs done that day.

## 2015-02-17 NOTE — Telephone Encounter (Signed)
Please send a letter to the patient asking him to come in for blood work. Thanks.

## 2015-02-18 ENCOUNTER — Other Ambulatory Visit: Payer: Self-pay | Admitting: Cardiovascular Disease

## 2015-02-18 ENCOUNTER — Ambulatory Visit (INDEPENDENT_AMBULATORY_CARE_PROVIDER_SITE_OTHER): Payer: Medicare HMO

## 2015-02-18 ENCOUNTER — Telehealth: Payer: Self-pay

## 2015-02-18 ENCOUNTER — Other Ambulatory Visit (INDEPENDENT_AMBULATORY_CARE_PROVIDER_SITE_OTHER): Payer: Medicare HMO

## 2015-02-18 ENCOUNTER — Other Ambulatory Visit: Payer: Self-pay

## 2015-02-18 DIAGNOSIS — R0602 Shortness of breath: Secondary | ICD-10-CM

## 2015-02-18 DIAGNOSIS — Z9221 Personal history of antineoplastic chemotherapy: Secondary | ICD-10-CM | POA: Diagnosis not present

## 2015-02-18 DIAGNOSIS — Z01812 Encounter for preprocedural laboratory examination: Secondary | ICD-10-CM

## 2015-02-18 DIAGNOSIS — I517 Cardiomegaly: Secondary | ICD-10-CM | POA: Diagnosis not present

## 2015-02-18 DIAGNOSIS — R600 Localized edema: Secondary | ICD-10-CM | POA: Diagnosis not present

## 2015-02-18 NOTE — Telephone Encounter (Signed)
Left detailed message of cath arrival time (6:30am) on pt home VM. Requested CB to confirm receipt of message.

## 2015-02-18 NOTE — Telephone Encounter (Signed)
Noted  

## 2015-02-18 NOTE — Patient Instructions (Signed)
Your physician has requested that you have a cardiac catheterization. Cardiac catheterization is used to diagnose and/or treat various heart conditions. Doctors may recommend this procedure for a number of different reasons. The most common reason is to evaluate chest pain. Chest pain can be a symptom of coronary artery disease (CAD), and cardiac catheterization can show whether plaque is narrowing or blocking your heart's arteries. This procedure is also used to evaluate the valves, as well as measure the blood flow and oxygen levels in different parts of your heart. For further information please visit [email or web address]. Please follow instruction sheet, as given.  [facility] Cardiac Cath Instructions   You are scheduled for a Cardiac Cath on: Thursday, January 19  Please arrive at _______am on the day of your procedure  You will need to pre-register prior to the day of your procedure.  Enter through the [employer] at [facility].  Registration is the first desk on your right.  Please take the procedure order we have given you in order to be registered appropriately  Do not eat/drink anything after midnight  Someone will need to drive you home  It is recommended someone be with you for the first 24 hours after your procedure  Wear clothes that are easy to get on/off and wear slip on shoes if possible   Medications bring a current list of all medications with you  _xx__ You may take all of your medications the morning of your procedure with enough water to swallow safely    Day of your procedure: Arrive at the [place] entrance.  Free valet service is available.  After entering the [facility] please check-in at the registration desk (1st desk on your right) to receive your armband. After receiving your armband someone will escort you to the cardiac cath/special procedures waiting area.  The usual length of stay after your procedure is about 2 to 3 hours.  This can vary.  If you have any  questions, please call our office at [phone], or you may call the cardiac cath lab at [facility] directly at [phone]  Your physician recommends that you follow-up February 2, 11:30am with Ignacia Bayley, NP.   Angiogram An angiogram, also called angiography, is a procedure used to look at the blood vessels. In this procedure, dye is injected through a long, thin tube (catheter) into an artery. X-rays are then taken. The X-rays will show if there is a blockage or problem in a blood vessel.  LET [facility] CARE PROVIDER KNOW ABOUT:  Any allergies you have, including allergies to shellfish or contrast dye.   All medicines you are taking, including vitamins, herbs, eye drops, creams, and over-the-counter medicines.   Previous problems you or members of your family have had with the use of anesthetics.   Any blood disorders you have.   Previous surgeries you have had.  Any previous kidney problems or failure you have had.  Medical conditions you have.   Possibility of pregnancy, if this applies. RISKS AND COMPLICATIONS Generally, an angiogram is a safe procedure. However, as with any procedure, problems can occur. Possible problems include:  Injury to the blood vessels, including rupture or bleeding.  Infection or bruising at the catheter site.  Allergic reaction to the dye or contrast used.  Kidney damage from the dye or contrast used.  Blood clots that can lead to a stroke or heart attack. BEFORE THE PROCEDURE  Do not eat or drink after midnight on the night before the procedure, or  as directed by your health care provider.   Ask your health care provider if you may drink enough water to take any needed medicines the morning of the procedure.  PROCEDURE  You may be given a medicine to help you relax (sedative) before and during the procedure. This medicine is given through an IV access tube that is inserted into one of your veins.   The area where the catheter will  be inserted will be washed and shaved. This is usually done in the groin but may be done in the fold of your arm (near your elbow) or in the wrist.  A medicine will be given to numb the area where the catheter will be inserted (local anesthetic).  The catheter will be inserted with a guide wire into an artery. The catheter is guided by using a type of X-ray (fluoroscopy) to the blood vessel being examined.   Dye is then injected into the catheter, and X-rays are taken. The dye helps to show where any narrowing or blockages are located.  AFTER THE PROCEDURE   If the procedure is done through the leg, you will be kept in bed lying flat for several hours. You will be instructed to not bend or cross your legs.  The insertion site will be checked frequently.  The pulse in your feet or wrist will be checked frequently.  Additional blood tests, X-rays, and electrocardiography may be done.   You may need to stay in the hospital overnight for observation.    This information is not intended to replace advice given to you by your health care provider. Make sure you discuss any questions you have with your health care provider.   Document Released: 11/01/2004 Document Revised: 02/12/2014 Document Reviewed: 06/25/2012 Elsevier Interactive Patient Education 2016 [place] After These instructions give you information about caring for yourself after your procedure. Your doctor may also give you more specific instructions. Call your doctor if you have any problems or questions after your procedure.  HOME CARE  Take medicines only as told by your doctor.  Follow your doctor's instructions about:  Care of the area where the tube was inserted.  Bandage (dressing) changes and removal.  You may shower 24-48 hours after the procedure or as told by your doctor.  Do not take baths, swim, or use a hot tub until your doctor approves.  Every day, check the area where the tube  was inserted. Watch for:  Redness, swelling, or pain.  Fluid, blood, or pus.  Do not apply powder or lotion to the site.  Do not lift anything that is heavier than 10 lb (4.5 kg) for 5 days or as told by your doctor.  Ask your doctor when you can:  Return to work or school.  Do physical activities or play sports.  Have sex.  Do not drive or operate heavy machinery for 24 hours or as told by your doctor.  Have someone with you for the first 24 hours after the procedure.  Keep all follow-up visits as told by your doctor. This is important. GET HELP IF:  You have a fever.   You have chills.   You have more bleeding from the area where the tube was inserted. Hold pressure on the area.  You have redness, swelling, or pain in the area where the tube was inserted.  You have fluid or pus coming from the area. GET HELP RIGHT AWAY IF:   You have a lot of  pain in the area where the tube was inserted.  The area where the tube was inserted is bleeding, and the bleeding does not stop after 30 minutes of holding steady pressure on the area.  The area near or just beyond the insertion site becomes pale, cool, tingly, or numb.   This information is not intended to replace advice given to you by your health care provider. Make sure you discuss any questions you have with your health care provider.   Document Released: 04/20/2008 Document Revised: 02/12/2014 Document Reviewed: 06/25/2012 Elsevier Interactive Patient Education [employer].

## 2015-02-18 NOTE — Telephone Encounter (Signed)
Pt had echo today in the office. Per Dr. Fletcher Anon, schedule pt for left and right heart cath Jan 19. Left message for scheduling, [phone], to call back with a time for cath. BMET drawn. Reviewed instructions w/pt and male who accompanied pt. Pt verbalized understanding and is agreeable with plan. Pt awaits phone call to inform him of arrival time Jan 19. Followup scheduled Feb 2 with Ignacia Bayley, NP

## 2015-02-19 LAB — BASIC METABOLIC PANEL
BUN/Creatinine Ratio: 14 (ref 10–22)
BUN: 16 mg/dL (ref 8–27)
CALCIUM: 8.3 mg/dL — AB (ref 8.6–10.2)
CHLORIDE: 106 mmol/L (ref 96–106)
CO2: 24 mmol/L (ref 18–29)
Creatinine, Ser: 1.18 mg/dL (ref 0.76–1.27)
GFR calc Af Amer: 73 mL/min/{1.73_m2} (ref >59)
GFR, EST NON AFRICAN AMERICAN: 63 mL/min/{1.73_m2} (ref >59)
Glucose: 125 mg/dL — ABNORMAL HIGH (ref 65–99)
POTASSIUM: 3.8 mmol/L (ref 3.5–5.2)
Sodium: 144 mmol/L (ref 134–144)

## 2015-02-21 ENCOUNTER — Encounter: Payer: Self-pay | Admitting: Family Medicine

## 2015-02-21 NOTE — Telephone Encounter (Signed)
Left message on pt home VM of cath arrival time, 6:30am. Requested call back to confirm receipt of message.

## 2015-02-22 NOTE — Telephone Encounter (Signed)
Left message on pt home VM of arrival time for cath on Jan 19 as 6:30am. Requested CB to confirm receipt of message.

## 2015-02-23 ENCOUNTER — Telehealth: Payer: Self-pay

## 2015-02-23 NOTE — Telephone Encounter (Signed)
Left detailed message on home VM regarding cath instructions including arrival time. Requested CB to confirm receipt of message. Called spouse work number which appears to be a cell number. No answer, no VM on which to leave message. Alternate wife number is no longer a working number per recording.

## 2015-02-24 ENCOUNTER — Ambulatory Visit: Payer: Medicare HMO | Admitting: Family Medicine

## 2015-02-24 ENCOUNTER — Other Ambulatory Visit: Payer: Self-pay

## 2015-02-24 ENCOUNTER — Telehealth: Payer: Self-pay

## 2015-02-24 ENCOUNTER — Ambulatory Visit
Admission: RE | Admit: 2015-02-24 | Discharge: 2015-02-24 | Disposition: A | Discharge status: Home / Self Care | Payer: Medicare HMO | Source: Ambulatory Visit | Attending: Cardiovascular Disease | Admitting: Cardiovascular Disease

## 2015-02-24 ENCOUNTER — Encounter
Admission: RE | Disposition: A | Discharge status: Home / Self Care | Payer: Self-pay | Source: Ambulatory Visit | Attending: Cardiovascular Disease

## 2015-02-24 ENCOUNTER — Encounter: Payer: Self-pay | Admitting: *Deleted

## 2015-02-24 DIAGNOSIS — R0600 Dyspnea, unspecified: Secondary | ICD-10-CM | POA: Diagnosis not present

## 2015-02-24 DIAGNOSIS — Z79899 Other long term (current) drug therapy: Secondary | ICD-10-CM | POA: Insufficient documentation

## 2015-02-24 DIAGNOSIS — E8809 Other disorders of plasma-protein metabolism, not elsewhere classified: Secondary | ICD-10-CM | POA: Diagnosis not present

## 2015-02-24 DIAGNOSIS — I272 Other secondary pulmonary hypertension: Secondary | ICD-10-CM | POA: Insufficient documentation

## 2015-02-24 DIAGNOSIS — Z9221 Personal history of antineoplastic chemotherapy: Secondary | ICD-10-CM | POA: Diagnosis not present

## 2015-02-24 DIAGNOSIS — I2510 Atherosclerotic heart disease of native coronary artery without angina pectoris: Secondary | ICD-10-CM | POA: Diagnosis not present

## 2015-02-24 DIAGNOSIS — R600 Localized edema: Secondary | ICD-10-CM | POA: Insufficient documentation

## 2015-02-24 DIAGNOSIS — Z85038 Personal history of other malignant neoplasm of large intestine: Secondary | ICD-10-CM | POA: Insufficient documentation

## 2015-02-24 DIAGNOSIS — I5022 Chronic systolic (congestive) heart failure: Secondary | ICD-10-CM | POA: Insufficient documentation

## 2015-02-24 DIAGNOSIS — I10 Essential (primary) hypertension: Secondary | ICD-10-CM | POA: Insufficient documentation

## 2015-02-24 DIAGNOSIS — Z9049 Acquired absence of other specified parts of digestive tract: Secondary | ICD-10-CM | POA: Diagnosis not present

## 2015-02-24 DIAGNOSIS — D649 Anemia, unspecified: Secondary | ICD-10-CM | POA: Diagnosis not present

## 2015-02-24 DIAGNOSIS — R0989 Other specified symptoms and signs involving the circulatory and respiratory systems: Secondary | ICD-10-CM | POA: Insufficient documentation

## 2015-02-24 DIAGNOSIS — Z91013 Allergy to seafood: Secondary | ICD-10-CM | POA: Diagnosis not present

## 2015-02-24 DIAGNOSIS — G629 Polyneuropathy, unspecified: Secondary | ICD-10-CM | POA: Insufficient documentation

## 2015-02-24 DIAGNOSIS — I159 Secondary hypertension, unspecified: Secondary | ICD-10-CM

## 2015-02-24 HISTORY — PX: CARDIAC CATHETERIZATION: SHX172

## 2015-02-24 SURGERY — RIGHT/LEFT HEART CATH AND CORONARY ANGIOGRAPHY
Anesthesia: Moderate Sedation | Laterality: Bilateral

## 2015-02-24 MED ORDER — HEPARIN SODIUM (PORCINE) 1000 UNIT/ML IJ SOLN
INTRAMUSCULAR | Status: DC | PRN
  Administered 2015-02-24: 4000 [IU] via INTRAVENOUS

## 2015-02-24 MED ORDER — FAMOTIDINE 20 MG PO TABS
ORAL_TABLET | ORAL | Status: AC
  Administered 2015-02-24: 20 mg via ORAL
  Filled 2015-02-24: qty 1

## 2015-02-24 MED ORDER — METHYLPREDNISOLONE SODIUM SUCC 125 MG IJ SOLR
INTRAMUSCULAR | Status: AC
  Administered 2015-02-24: 125 mg via INTRAVENOUS
  Filled 2015-02-24: qty 2

## 2015-02-24 MED ORDER — MIDAZOLAM HCL 2 MG/2ML IJ SOLN
INTRAMUSCULAR | Status: AC
  Filled 2015-02-24: qty 2

## 2015-02-24 MED ORDER — HEPARIN SODIUM (PORCINE) 1000 UNIT/ML IJ SOLN
INTRAMUSCULAR | Status: AC
  Filled 2015-02-24: qty 1

## 2015-02-24 MED ORDER — FAMOTIDINE 20 MG PO TABS
20.0000 mg | ORAL_TABLET | Freq: Once | ORAL | Status: AC
  Administered 2015-02-24: 20 mg via ORAL

## 2015-02-24 MED ORDER — VERAPAMIL HCL 2.5 MG/ML IV SOLN
INTRAVENOUS | Status: DC | PRN
  Administered 2015-02-24: 2.5 mg via INTRA_ARTERIAL

## 2015-02-24 MED ORDER — METHYLPREDNISOLONE SODIUM SUCC 125 MG IJ SOLR
125.0000 mg | Freq: Once | INTRAMUSCULAR | Status: AC
  Administered 2015-02-24: 125 mg via INTRAVENOUS

## 2015-02-24 MED ORDER — FENTANYL CITRATE (PF) 100 MCG/2ML IJ SOLN
INTRAMUSCULAR | Status: DC | PRN
  Administered 2015-02-24: 25 ug via INTRAVENOUS

## 2015-02-24 MED ORDER — HEPARIN (PORCINE) IN NACL 2-0.9 UNIT/ML-% IJ SOLN
INTRAMUSCULAR | Status: AC
  Filled 2015-02-24: qty 1000

## 2015-02-24 MED ORDER — VERAPAMIL HCL 2.5 MG/ML IV SOLN
INTRAVENOUS | Status: AC
  Filled 2015-02-24: qty 2

## 2015-02-24 MED ORDER — IOHEXOL 300 MG/ML  SOLN
INTRAMUSCULAR | Status: DC | PRN
  Administered 2015-02-24: 75 mL via INTRA_ARTERIAL

## 2015-02-24 MED ORDER — FUROSEMIDE 40 MG PO TABS: 40.0000 mg | ORAL_TABLET | Freq: Two times a day (BID) | ORAL | Status: DC

## 2015-02-24 MED ORDER — FENTANYL CITRATE (PF) 100 MCG/2ML IJ SOLN
INTRAMUSCULAR | Status: AC
  Filled 2015-02-24: qty 2

## 2015-02-24 MED ORDER — SODIUM CHLORIDE 0.9 % IV SOLN
INTRAVENOUS | Status: DC
  Administered 2015-02-24: 07:00:00 via INTRAVENOUS

## 2015-02-24 MED ORDER — MIDAZOLAM HCL 2 MG/2ML IJ SOLN
INTRAMUSCULAR | Status: DC | PRN
  Administered 2015-02-24: 1 mg via INTRAVENOUS

## 2015-02-24 SURGICAL SUPPLY — 10 items
CATH BALLN WEDGE 5F 110CM (CATHETERS) ×2 IMPLANT
CATH INFINITI 5 FR JL3.5 (CATHETERS) ×2 IMPLANT
CATH INFINITI 5FR ANG PIGTAIL (CATHETERS) ×2 IMPLANT
CATH OPTITORQUE JACKY 4.0 5F (CATHETERS) ×2 IMPLANT
DEVICE RAD TR BAND REGULAR (VASCULAR PRODUCTS) ×2 IMPLANT
GLIDESHEATH SLEND SS 6F .021 (SHEATH) ×4 IMPLANT
KIT MANI 3VAL PERCEP (MISCELLANEOUS) ×2 IMPLANT
KIT RIGHT HEART (MISCELLANEOUS) ×2 IMPLANT
PACK CARDIAC CATH (CUSTOM PROCEDURE TRAY) ×2 IMPLANT
WIRE SAFE-T 1.5MM-J .035X260CM (WIRE) ×2 IMPLANT

## 2015-02-24 NOTE — H&P (View-Only) (Signed)
Primary care physician: Dr. Caryl Bis  HPI  This is a pleasant 70 -year-old male who was referred by Dr. Caryl Bis for evaluation of congestive heart failure. The patient has no previous cardiac history. He has known history of colon cancer status post colectomy in 2015 followed by chemotherapy with oxaliplatin and fluorouracil. He had neuropathy as a result. He has no other significant medical problems. He is not a smoker and has not had any alcohol in 10 years. He is not diabetic. Over the last few months, he has experienced progressive severe leg edema and most recently over the last few weeks he started having significant exertional dyspnea without chest discomfort. No chest pain. He developed some venous ulcers on his legs as a result of the swelling. He was seen by Dr. Caryl Bis. He had labs performed which showed mild hypoalbuminemia at 3.3, mild anemia with a hemoglobin of 12.4, normal thyroid function and significantly elevated BNP at 2800. Chest x-ray showed cardiomegaly with pulmonary vascular congestion. He was started on Lasix 40 mg once daily with gradual improvement in edema.  Allergies  Allergen Reactions  . Shellfish Allergy Other (See Comments)    Pt. instructed by MD to avoid seafood     Current Outpatient Prescriptions on File Prior to Visit  Medication Sig Dispense Refill  . chlorpheniramine (CHLOR-TRIMETON) 4 MG tablet Take 1 tablet (4 mg total) by mouth 2 (two) times daily as needed for allergies or rhinitis. 30 tablet 0  . furosemide (LASIX) 20 MG tablet Take 2 tablets (40 mg total) by mouth daily. 60 tablet 0  . gabapentin (NEURONTIN) 100 MG capsule Take 2 capsules (200 mg total) by mouth 3 (three) times daily. 180 capsule 0  . lisinopril (PRINIVIL,ZESTRIL) 10 MG tablet Take 1 tablet (10 mg total) by mouth daily. 90 tablet 3  . Potassium Bicarb-Citric Acid 20 MEQ TBEF Take 20 mEq by mouth daily.     No current facility-administered medications on file prior to visit.      Past Medical History  Diagnosis Date  . Hypertension   . Cancer [facility])     Colon  . Anemia      Past Surgical History  Procedure Laterality Date  . Portacath placement Left   . Colon surgery      Colectomy  . Port-a-cath removal N/A 07/12/2014    Procedure: REMOVAL PORT-A-CATH;  Surgeon: Molly Maduro, MD;  Location: [facility] ORS;  Service: General;  Laterality: N/A;     No family history on file.   Social History   Social History  . Marital Status: Married    Spouse Name: N/A  . Number of Children: N/A  . Years of Education: N/A   Occupational History  . Not on file.   Social History Main Topics  . Smoking status: Never Smoker   . Smokeless tobacco: Never Used  . Alcohol Use: No  . Drug Use: No  . Sexual Activity: Not on file   Other Topics Concern  . Not on file   Social History Narrative     ROS A 10 point review of system was performed. It is negative other than that mentioned in the history of present illness.   PHYSICAL EXAM   BP 124/74 mmHg  Pulse 80  Ht 5\' 9"  (1.753 m)  Wt 183 lb 6.4 oz (83.19 kg)  BMI 27.07 kg/m2 Constitutional: He is oriented to person, place, and time. He appears well-developed and well-nourished. No distress.  HENT: No nasal discharge.  Head: Normocephalic  and atraumatic.  Eyes: Pupils are equal and round.  No discharge. Neck: Normal range of motion. Neck supple. No JVD present. No thyromegaly present.  Cardiovascular: Normal rate, regular rhythm with premature beats, normal heart sounds. Exam reveals no gallop and no friction rub. No murmur heard.  Pulmonary/Chest: Effort normal and diminished breath sounds at the base. No stridor. No respiratory distress. He has no wheezes. He has no rales. He exhibits no tenderness.  Abdominal: Soft. Bowel sounds are normal. He exhibits no distension. There is no tenderness. There is no rebound and no guarding.  Musculoskeletal: Normal range of motion. He exhibits +3 edema and no  tenderness.  Neurological: He is alert and oriented to person, place, and time. Coordination normal.  Skin: Skin is warm and dry. Chronic stasis dermatitis with ulcers on the left shin area suggestive of venous ulceration.  Psychiatric: He has a normal mood and affect. His behavior is normal. Judgment and thought content normal.       EKG: Normal sinus rhythm with frequent PACs. T wave changes suggestive of lateral ischemia.   ASSESSMENT AND PLAN

## 2015-02-24 NOTE — Telephone Encounter (Signed)
Per Dr. Fletcher Anon: "I increased his Lasix post cath to 40 mg bid. I sent him a prescription. Please check BMP in 1 week."  Left detailed message w/lab appt (Jan 26 at 9:30am). Requested call back to confirm

## 2015-02-24 NOTE — Procedures (Signed)
Pt discharged to home.  TR site intact, no active bleeding.

## 2015-02-24 NOTE — Interval H&P Note (Signed)
History and Physical Interval Note:  02/24/2015 7:44 AM  [name].  has presented today for surgery, with the diagnosis of Lt and Rt Heart CHF  The various methods of treatment have been discussed with the patient and family. After consideration of risks, benefits and other options for treatment, the patient has consented to  Procedure(s): Right/Left Heart Cath and Coronary Angiography (Bilateral) as a surgical intervention .  The patient's history has been reviewed, patient examined, no change in status, stable for surgery.  I have reviewed the patient's chart and labs.  Questions were answered to the patient's satisfaction.     [name]

## 2015-02-25 NOTE — Telephone Encounter (Signed)
Left detailed message on pt home VM w/CB request. No answer, no VM on wife's work number. Mailed letter w/lab appt.

## 2015-03-03 ENCOUNTER — Other Ambulatory Visit: Payer: Medicare HMO

## 2015-03-03 ENCOUNTER — Other Ambulatory Visit
Admission: RE | Admit: 2015-03-03 | Discharge: 2015-03-03 | Disposition: A | Discharge status: Home / Self Care | Payer: Medicare HMO | Source: Ambulatory Visit | Attending: Cardiovascular Disease | Admitting: Cardiovascular Disease

## 2015-03-03 ENCOUNTER — Telehealth: Payer: Self-pay

## 2015-03-03 DIAGNOSIS — I159 Secondary hypertension, unspecified: Secondary | ICD-10-CM | POA: Insufficient documentation

## 2015-03-03 LAB — BASIC METABOLIC PANEL
Anion gap: 6 (ref 5–15)
BUN: 17 mg/dL (ref 6–20)
CALCIUM: 8.9 mg/dL (ref 8.9–10.3)
CHLORIDE: 106 mmol/L (ref 101–111)
CO2: 31 mmol/L (ref 22–32)
CREATININE: 1.23 mg/dL (ref 0.61–1.24)
GFR calc non Af Amer: 59 mL/min — ABNORMAL LOW (ref >60)
Glucose, Bld: 114 mg/dL — ABNORMAL HIGH (ref 65–99)
Potassium: 4.1 mmol/L (ref 3.5–5.1)
SODIUM: 143 mmol/L (ref 135–145)

## 2015-03-03 NOTE — Telephone Encounter (Signed)
Left detailed message regarding need for labs.

## 2015-03-10 ENCOUNTER — Ambulatory Visit: Payer: Medicare HMO | Admitting: Nurse Practitioner

## 2015-03-10 ENCOUNTER — Encounter: Payer: Self-pay | Admitting: *Deleted

## 2015-03-16 ENCOUNTER — Encounter: Payer: Self-pay | Admitting: Hematology and Oncology

## 2015-03-18 ENCOUNTER — Other Ambulatory Visit: Payer: Self-pay | Admitting: Cardiovascular Disease

## 2015-03-22 ENCOUNTER — Ambulatory Visit: Payer: Medicare HMO | Admitting: Nurse Practitioner

## 2015-04-14 ENCOUNTER — Encounter: Payer: Self-pay | Admitting: Nurse Practitioner

## 2015-04-14 ENCOUNTER — Ambulatory Visit (INDEPENDENT_AMBULATORY_CARE_PROVIDER_SITE_OTHER): Payer: Medicare HMO | Admitting: Nurse Practitioner

## 2015-04-14 VITALS — BP 120/80 | HR 64 | Ht 69.0 in | Wt 180.5 lb

## 2015-04-14 DIAGNOSIS — I119 Hypertensive heart disease without heart failure: Secondary | ICD-10-CM | POA: Insufficient documentation

## 2015-04-14 DIAGNOSIS — I2510 Atherosclerotic heart disease of native coronary artery without angina pectoris: Secondary | ICD-10-CM | POA: Diagnosis not present

## 2015-04-14 DIAGNOSIS — I110 Hypertensive heart disease with heart failure: Secondary | ICD-10-CM | POA: Diagnosis not present

## 2015-04-14 DIAGNOSIS — I5022 Chronic systolic (congestive) heart failure: Secondary | ICD-10-CM | POA: Insufficient documentation

## 2015-04-14 DIAGNOSIS — I429 Cardiomyopathy, unspecified: Secondary | ICD-10-CM | POA: Diagnosis not present

## 2015-04-14 MED ORDER — ATORVASTATIN CALCIUM 40 MG PO TABS: 40.0000 mg | ORAL_TABLET | Freq: Every day | ORAL | Status: DC

## 2015-04-14 MED ORDER — ASPIRIN EC 81 MG PO TBEC: 81.0000 mg | DELAYED_RELEASE_TABLET | Freq: Every day | ORAL | Status: DC

## 2015-04-14 NOTE — Progress Notes (Signed)
Office Visit    Patient Name: [name]. Date of Encounter: 04/14/2015  Primary Care Provider:  Tommi Rumps, MD Primary Cardiologist:  Jerilynn Mages. Fletcher Anon, MD   Chief Complaint    70 year old male with recent diagnosis of systolic heart failure and moderate CAD, who presents for follow-up.  Past Medical History    Past Medical History  Diagnosis Date  . Hypertensive heart disease   . Colon cancer ([place])     a. 2015 s/p colectomy followed by chemoRx with oxaliplatin & fluorouacil.  . Anemia   . Mixed Ischemic and Non-ischemic Cardiomyopathy     a. 02/2015 Echo: EF 20-25%, diff HK.  . Chronic systolic CHF (congestive heart failure) ([place])     a. 02/2015 Echo: EF 20-25%, diff HK, mod MR, mildy dil LA, mildly dil RV w/ mod RV syst dysfxn, mildly dil RA, mod TR, PASP 62mmHg.  . Moderate mitral regurgitation   . Moderate tricuspid regurgitation   . CAD (coronary artery disease)     a. 02/2015 Cath: LM nl, LAD 50/40 mid/distal, LCX 80p, RCA 40p, 30d, RPL1 40, CO 3.27, CI 1.65.   Past Surgical History  Procedure Laterality Date  . Portacath placement Left   . Colon surgery      Colectomy  . Port-a-cath removal N/A 07/12/2014    Procedure: REMOVAL PORT-A-CATH;  Surgeon: Molly Maduro, MD;  Location: [facility] ORS;  Service: General;  Laterality: N/A;  . Cardiac catheterization Bilateral 02/24/2015    Procedure: Right/Left Heart Cath and Coronary Angiography;  Surgeon: Wellington Hampshire, MD;  Location: [place] [facility];  Service: Cardiovascular;  Laterality: Bilateral;   Allergies  Allergies  Allergen Reactions  . Shellfish Allergy Other (See Comments)    Pt. instructed by MD to avoid seafood    History of Present Illness    70 year old male with the above complex past medical history. Due to a several month history of progressive lower extremity edema and exertional chest discomfort, he was evaluated by Dr. Fletcher Anon in January. Echocardiogram was performed and revealed severe LV  dysfunction with an EF of 20-25% along with moderate mitral and tricuspid regurgitation. He subsequently underwent diagnostic catheterization revealing moderate multivessel CAD including an 80% lesion in the proximal left circumflex. It was felt at that time, in the absence of a history of chest pain, that he would be best served by management of his heart failure and then consideration could be given in the future to PCI of the left circumflex. Patient had been taking Lasix 40 mg twice a day and had a stable basic metabolic panel as of January 26. He has not been seen since then however. He says he was doing well at home on twice a day Lasix and his weight was steady at 175 pounds. He has had significant improvement in his dyspnea and some reduction in his lower extremity edema. He recently saw primary care and was advised to reduce his Lasix to 40 mg daily. Since then, his weight is up 5 pounds and he has had some return of lower extremity edema. He has not been having dyspnea, orthopnea, PND, dizziness, syncope, palpitations, or chest pain. He would be interested in pursuing PCI of the left circumflex.  Home Medications    Prior to Admission medications   Medication Sig Start Date End Date Taking? Authorizing Provider  carvedilol (COREG) 3.125 MG tablet Take 1 tablet (3.125 mg total) by mouth 2 (two) times daily. 02/15/15  Yes Wellington Hampshire,  MD  chlorpheniramine (CHLOR-TRIMETON) 4 MG tablet Take 1 tablet (4 mg total) by mouth 2 (two) times daily as needed for allergies or rhinitis. 01/15/15  Yes Pierce Crane Beers, PA-C  furosemide (LASIX) 40 MG tablet Take 1 tablet (40 mg total) by mouth 2 (two) times daily. 02/24/15  Yes Wellington Hampshire, MD  gabapentin (NEURONTIN) 100 MG capsule Take 2 capsules (200 mg total) by mouth 3 (three) times daily. 01/14/15  Yes Lequita Asal, MD  lisinopril (PRINIVIL,ZESTRIL) 10 MG tablet Take 1 tablet (10 mg total) by mouth daily. 02/10/15  Yes Leone Haven, MD    lisinopril (PRINIVIL,ZESTRIL) 20 MG tablet take 1 tablet by mouth once daily 03/18/15  Yes Wellington Hampshire, MD  Potassium Bicarb-Citric Acid 20 MEQ TBEF Take 20 mEq by mouth daily.   Yes Historical Provider, MD  aspirin EC 81 MG tablet Take 1 tablet (81 mg total) by mouth daily. 04/14/15   Rogelia Mire, NP  atorvastatin (LIPITOR) 40 MG tablet Take 1 tablet (40 mg total) by mouth daily. 04/14/15   Rogelia Mire, NP    Review of Systems    As above, he has had some return of lower extremity edema with a 5 pound weight gain since his Lasix dose was adjusted.  He denies chest pain, palpitations, dyspnea, pnd, orthopnea, n, v, dizziness, syncope, or early satiety.  All other systems reviewed and are otherwise negative except as noted above.  Physical Exam    VS:  BP 120/80 mmHg  Pulse 64  Ht 5\' 9"  (1.753 m)  Wt 180 lb 8 oz (81.874 kg)  BMI 26.64 kg/m2 , BMI Body mass index is 26.64 kg/(m^2). GEN: Well nourished, well developed, in no acute distress. HEENT: normal. Neck: Supple, no carotid bruits, or masses. JVP approximately 12 cm. Cardiac: RRR, no murmurs, rubs, or gallops. No clubbing, cyanosis, 1+ bilat LE woody edema.  Radials/DP/PT 2+ and equal bilaterally.  Respiratory:  Respirations regular and unlabored, clear to auscultation bilaterally. GI: Soft, nontender, nondistended, BS + x 4. MS: no deformity or atrophy. Skin: warm and dry, no rash. Neuro:  Strength and sensation are intact. Psych: Normal affect.  Accessory Clinical Findings    ECG - RSR, 64, LAD, inf, antlat TWI - no acute changes.  Assessment & Plan    1.  Chronic systolic congestive heart failure/mixed ischemic and nonischemic cardiomyopathy: EF of 20-25% with recent catheterization revealing 80% proximal left circumflex stenosis and otherwise moderate nonobstructive CAD. LV dysfunction is felt to be out of proportion to degree of coronary stenosis. His weight had been stable at home but his Lasix dose was  recently reduced and since then has had 5 pound weight gain. He has had return of 1+ lower extremity edema and he does have mild JVD. I have asked him to increase his Lasix to 40 mg twice a day. I will bring him back in one week for a basic metabolic panel followed by follow-up with Dr. Fletcher Anon. He remains on beta blocker and ACE inhibitor therapy. If renal function stable on repeat evaluation, we can likely add low-dose digoxin given the low cardiac index.   2. Coronary artery disease: As above, catheterization revealed 80% proximal left circumflex stenosis. This was felt to be amenable to PCI, but this was deferred secondary to significant volume overload. Patient has not been having any chest pain. As he is mildly volume overloaded today, I have increased his Lasix back to prior dosing. Follow-up basic metabolic panel  next week and if stable, we could consider PCI after follow-up with Dr. Fletcher Anon. Continue beta blocker. I will add aspirin and statin therapy as well. He will need follow-up lipids and LFTs in approximately 6-8 weeks.   3. Hypertensive heart disease: Blood pressure is stable on beta blocker and ACE inhibitor therapy.  4. Moderate mitral regurgitation and tricuspid regurgitation: Continue conservative therapy and volume management.  5. Disposition: Follow-up basic metabolic panel next week. Follow-up in 1-2 weeks for reassessment and consideration for PCI.  Murray Hodgkins, NP 04/14/2015, 3:27 PM

## 2015-04-14 NOTE — Patient Instructions (Signed)
Medication Instructions:  Your physician has recommended you make the following change in your medication:  CONTINUE lasix 40mg  twice daily START taking aspirin 81mg  daily START lipitor 40mg  daily   Labwork: BMET next week  Testing/Procedures: none  Follow-Up: Your physician recommends that you schedule a follow-up appointment with Dr. Fletcher Anon next week.    Any Other Special Instructions Will Be Listed Below (If Applicable).     If you need a refill on your cardiac medications before your next appointment, please call your pharmacy.

## 2015-04-18 ENCOUNTER — Other Ambulatory Visit: Payer: Self-pay | Admitting: Family Medicine

## 2015-04-19 ENCOUNTER — Other Ambulatory Visit: Payer: Self-pay | Admitting: *Deleted

## 2015-04-19 ENCOUNTER — Other Ambulatory Visit (INDEPENDENT_AMBULATORY_CARE_PROVIDER_SITE_OTHER): Payer: Medicare HMO | Admitting: *Deleted

## 2015-04-19 DIAGNOSIS — I110 Hypertensive heart disease with heart failure: Secondary | ICD-10-CM | POA: Diagnosis not present

## 2015-04-19 DIAGNOSIS — I5022 Chronic systolic (congestive) heart failure: Secondary | ICD-10-CM

## 2015-04-19 MED ORDER — FUROSEMIDE 40 MG PO TABS: 40.0000 mg | ORAL_TABLET | Freq: Two times a day (BID) | ORAL | Status: DC

## 2015-04-19 NOTE — Telephone Encounter (Signed)
Requested Prescriptions   Signed Prescriptions Disp Refills  . furosemide (LASIX) 40 MG tablet 60 tablet 3    Sig: Take 1 tablet (40 mg total) by mouth 2 (two) times daily.    Authorizing Provider: Rogelia Mire    Ordering User: Britt Bottom

## 2015-04-20 LAB — BASIC METABOLIC PANEL
BUN/Creatinine Ratio: 9 — ABNORMAL LOW (ref 10–22)
BUN: 12 mg/dL (ref 8–27)
CALCIUM: 9.1 mg/dL (ref 8.6–10.2)
CO2: 24 mmol/L (ref 18–29)
Chloride: 103 mmol/L (ref 96–106)
Creatinine, Ser: 1.35 mg/dL — ABNORMAL HIGH (ref 0.76–1.27)
GFR calc non Af Amer: 54 mL/min/{1.73_m2} — ABNORMAL LOW (ref >59)
GFR, EST AFRICAN AMERICAN: 62 mL/min/{1.73_m2} (ref >59)
Glucose: 121 mg/dL — ABNORMAL HIGH (ref 65–99)
Potassium: 4.3 mmol/L (ref 3.5–5.2)
Sodium: 142 mmol/L (ref 134–144)

## 2015-05-16 ENCOUNTER — Inpatient Hospital Stay: Payer: Medicare HMO | Attending: Hematology and Oncology | Admitting: Hematology and Oncology

## 2015-05-16 ENCOUNTER — Inpatient Hospital Stay: Payer: Medicare HMO

## 2015-05-16 VITALS — BP 121/66 | HR 60 | Temp 96.5°F | Resp 18 | Wt 177.7 lb

## 2015-05-16 DIAGNOSIS — R918 Other nonspecific abnormal finding of lung field: Secondary | ICD-10-CM

## 2015-05-16 DIAGNOSIS — C187 Malignant neoplasm of sigmoid colon: Secondary | ICD-10-CM

## 2015-05-16 DIAGNOSIS — Z7982 Long term (current) use of aspirin: Secondary | ICD-10-CM | POA: Insufficient documentation

## 2015-05-16 DIAGNOSIS — D649 Anemia, unspecified: Secondary | ICD-10-CM | POA: Diagnosis not present

## 2015-05-16 DIAGNOSIS — I429 Cardiomyopathy, unspecified: Secondary | ICD-10-CM | POA: Diagnosis not present

## 2015-05-16 DIAGNOSIS — Z79899 Other long term (current) drug therapy: Secondary | ICD-10-CM | POA: Diagnosis not present

## 2015-05-16 DIAGNOSIS — I5022 Chronic systolic (congestive) heart failure: Secondary | ICD-10-CM

## 2015-05-16 DIAGNOSIS — C189 Malignant neoplasm of colon, unspecified: Secondary | ICD-10-CM | POA: Diagnosis present

## 2015-05-16 DIAGNOSIS — R161 Splenomegaly, not elsewhere classified: Secondary | ICD-10-CM | POA: Diagnosis not present

## 2015-05-16 DIAGNOSIS — I2510 Atherosclerotic heart disease of native coronary artery without angina pectoris: Secondary | ICD-10-CM | POA: Insufficient documentation

## 2015-05-16 DIAGNOSIS — G629 Polyneuropathy, unspecified: Secondary | ICD-10-CM | POA: Insufficient documentation

## 2015-05-16 LAB — COMPREHENSIVE METABOLIC PANEL
ALT: 22 U/L (ref 17–63)
AST: 27 U/L (ref 15–41)
Albumin: 4.2 g/dL (ref 3.5–5.0)
Alkaline Phosphatase: 90 U/L (ref 38–126)
Anion gap: 4 — ABNORMAL LOW (ref 5–15)
BUN: 18 mg/dL (ref 6–20)
CO2: 27 mmol/L (ref 22–32)
Calcium: 9.2 mg/dL (ref 8.9–10.3)
Chloride: 108 mmol/L (ref 101–111)
Creatinine, Ser: 1.46 mg/dL — ABNORMAL HIGH (ref 0.61–1.24)
GFR calc Af Amer: 55 mL/min — ABNORMAL LOW (ref >60)
GFR calc non Af Amer: 48 mL/min — ABNORMAL LOW (ref >60)
Glucose, Bld: 144 mg/dL — ABNORMAL HIGH (ref 65–99)
Potassium: 3.5 mmol/L (ref 3.5–5.1)
Sodium: 139 mmol/L (ref 135–145)
Total Bilirubin: 0.9 mg/dL (ref 0.3–1.2)
Total Protein: 7.6 g/dL (ref 6.5–8.1)

## 2015-05-16 LAB — CBC WITH DIFFERENTIAL/PLATELET
Basophils Absolute: 0 10*3/uL (ref 0–0.1)
Basophils Relative: 1 %
Eosinophils Absolute: 0.2 10*3/uL (ref 0–0.7)
Eosinophils Relative: 6 %
HCT: 38.8 % — ABNORMAL LOW (ref 40.0–52.0)
Hemoglobin: 13.3 g/dL (ref 13.0–18.0)
Lymphocytes Relative: 19 %
Lymphs Abs: 0.7 10*3/uL — ABNORMAL LOW (ref 1.0–3.6)
MCH: 32.3 pg (ref 26.0–34.0)
MCHC: 34.3 g/dL (ref 32.0–36.0)
MCV: 94.1 fL (ref 80.0–100.0)
Monocytes Absolute: 0.5 10*3/uL (ref 0.2–1.0)
Monocytes Relative: 13 %
Neutro Abs: 2.4 10*3/uL (ref 1.4–6.5)
Neutrophils Relative %: 61 %
Platelets: 130 10*3/uL — ABNORMAL LOW (ref 150–440)
RBC: 4.12 MIL/uL — ABNORMAL LOW (ref 4.40–5.90)
RDW: 16.6 % — ABNORMAL HIGH (ref 11.5–14.5)
WBC: 3.8 10*3/uL (ref 3.8–10.6)

## 2015-05-16 NOTE — Progress Notes (Signed)
[facility] day:  05/16/2015   Chief Complaint: [name]. is a 70 y.o. male with a history of stage IIIC colon cancer who is seen for 4 month assessment.  HPI: The patient was last seen in the medical oncology clinic on 01/14/2015.  At that time, he noted a residual neuropathy which had gotten worse off Neurontin.  He had cold-like symptoms for a week.  Exam was unremarkable.  Hematocrit was 38.5, hemoglobin 12.7, and MCV 94.5.  CEA was 1.1.  Imaging studies revealed no evidence of metastatic disease.    Imaging studies were worrisome for pulmonary edema and CHF.  He was referred to follow-up with his PCP.  He subsequently has been seen by cardiology.  He underwent echo and cardiac catheterization.  EF was 20-25%  In 02/2015.  Cardiac catheterization revealed 80% occlusion of the proximal left circumflex.  He has been medically managed.  PCI is being considered.  He has a follow-up cardiology appointment on 05/30/2015.  He denies any abdominal symptoms.  He has persistent neuropathy.  He denies any melena or hematochezia.  Lower extremity swelling has improved with Lasix.    Past Medical History  Diagnosis Date  . Hypertensive heart disease   . Colon cancer ([place])     a. 2015 s/p colectomy followed by chemoRx with oxaliplatin & fluorouacil.  . Anemia   . Mixed Ischemic and Non-ischemic Cardiomyopathy     a. 02/2015 Echo: EF 20-25%, diff HK.  . Chronic systolic CHF (congestive heart failure) ([place])     a. 02/2015 Echo: EF 20-25%, diff HK, mod MR, mildy dil LA, mildly dil RV w/ mod RV syst dysfxn, mildly dil RA, mod TR, PASP 58mHg.  . Moderate mitral regurgitation   . Moderate tricuspid regurgitation   . CAD (coronary artery disease)     a. 02/2015 Cath: LM nl, LAD 50/40 mid/distal, LCX 80p, RCA 40p, 30d, RPL1 40, CO 3.27, CI 1.65.    Past Surgical History  Procedure Laterality Date  . Portacath placement Left   . Colon surgery       Colectomy  . Port-a-cath removal N/A 07/12/2014    Procedure: REMOVAL PORT-A-CATH;  Surgeon: WMolly Maduro MD;  Location: [facility] ORS;  Service: General;  Laterality: N/A;  . Cardiac catheterization Bilateral 02/24/2015    Procedure: Right/Left Heart Cath and Coronary Angiography;  Surgeon: MWellington Hampshire MD;  [facility];  Service: Cardiovascular;  Laterality: Bilateral;    Family History  Problem Relation Age of Onset  . Family history unknown: Yes    Social History:  reports that he has never smoked. He has never used smokeless tobacco. He reports that he does not drink alcohol or use illicit drugs.  He has [place] from the city.  The patient is accompanied by his wife today.  Allergies:  Allergies  Allergen Reactions  . Shellfish Allergy Other (See Comments)    Pt. instructed by MD to avoid seafood    Current Medications: Current Outpatient Prescriptions  Medication Sig Dispense Refill  . aspirin EC 81 MG tablet Take 1 tablet (81 mg total) by mouth daily. 90 tablet 3  . atorvastatin (LIPITOR) 40 MG tablet Take 1 tablet (40 mg total) by mouth daily. 30 tablet 3  . carvedilol (COREG) 3.125 MG tablet Take 1 tablet (3.125 mg total) by mouth 2 (two) times daily. 60 tablet 3  . chlorpheniramine (CHLOR-TRIMETON) 4 MG tablet Take 1 tablet (4  mg total) by mouth 2 (two) times daily as needed for allergies or rhinitis. 30 tablet 0  . furosemide (LASIX) 20 MG tablet take 2 tablets by mouth once daily 60 tablet 0  . gabapentin (NEURONTIN) 100 MG capsule Take 2 capsules (200 mg total) by mouth 3 (three) times daily. 180 capsule 0  . lisinopril (PRINIVIL,ZESTRIL) 10 MG tablet Take 1 tablet (10 mg total) by mouth daily. 90 tablet 3  . Potassium Bicarb-Citric Acid 20 MEQ TBEF Take 20 mEq by mouth daily.     No current facility-administered medications for this visit.    Review of Systems:  GENERAL:  Feels good. No fevers or sweats.  Weight down 3 pounds  (fluid). PERFORMANCE STATUS (ECOG):  0 HEENT:  No visual changes, runny nose, sore throat, mouth sores or tenderness. Lungs: No shortness of breath or cough.  No hemoptysis. Cardiac:  No chest pain, palpitations, orthopnea, or PND. GI:  No nausea, vomiting, diarrhea, constipation, melena or hematochezia. GU:  No urgency, frequency, dysuria, or hematuria. Musculoskeletal:  No back pain.  No joint pain.  No muscle tenderness. Extremities:  No pain or swelling. Skin:  No rashes or skin changes. Neuro:  Neuropathy in fingers and toes.  No headache, numbness or weakness, balance or coordination issues. Endocrine:  No diabetes, thyroid issues, hot flashes or night sweats. Psych:  No mood changes, depression or anxiety. Pain:  No focal pain. Review of systems:  All other systems reviewed and found to be negative.  Physical Exam: Blood pressure 121/66, pulse 60, temperature 96.5 F (35.8 C), temperature source Tympanic, resp. rate 18, weight 177 lb 11.1 oz (80.6 kg). GENERAL:  Well developed, well nourished, sitting comfortably in the exam room in no acute distress. MENTAL STATUS:  Alert and oriented to person, place and time. HEAD:  Near alopecia.  Normocephalic, atraumatic, face symmetric, no Cushingoid features. EYES:  Brown eyes.  Pupils equal round and reactive to light and accomodation.  No conjunctivitis or scleral icterus. ENT:  Oropharynx clear without lesion.  Poor dentition.  Tongue normal. Mucous membranes moist.  RESPIRATORY:  Clear to auscultation without rales, wheezes or rhonchi. CARDIOVASCULAR:  Regular rate and rhythm without murmur, rub or gallop. No JVD. ABDOMEN:  Soft, non-tender, with active bowel sounds, and no hepatosplenomegaly.  No masses. SKIN:  No rashes, ulcers or lesions. EXTREMITIES: Mild lower extremity edema.  No skin discoloration or tenderness.  No palpable cords. LYMPH NODES: No palpable cervical, supraclavicular, axillary or inguinal adenopathy   NEUROLOGICAL: Unremarkable. PSYCH:  Appropriate.  Appointment on 05/16/2015  Component Date Value Ref Range Status  . WBC 05/16/2015 3.8  3.8 - 10.6 K/uL Final  . RBC 05/16/2015 4.12* 4.40 - 5.90 MIL/uL Final  . Hemoglobin 05/16/2015 13.3  13.0 - 18.0 g/dL Final  . HCT 05/16/2015 38.8* 40.0 - 52.0 % Final  . MCV 05/16/2015 94.1  80.0 - 100.0 fL Final  . MCH 05/16/2015 32.3  26.0 - 34.0 pg Final  . MCHC 05/16/2015 34.3  32.0 - 36.0 g/dL Final  . RDW 05/16/2015 16.6* 11.5 - 14.5 % Final  . Platelets 05/16/2015 130* 150 - 440 K/uL Final  . Neutrophils Relative % 05/16/2015 61   Final  . Neutro Abs 05/16/2015 2.4  1.4 - 6.5 K/uL Final  . Lymphocytes Relative 05/16/2015 19   Final  . Lymphs Abs 05/16/2015 0.7* 1.0 - 3.6 K/uL Final  . Monocytes Relative 05/16/2015 13   Final  . Monocytes Absolute 05/16/2015 0.5  0.2 -  1.0 K/uL Final  . Eosinophils Relative 05/16/2015 6   Final  . Eosinophils Absolute 05/16/2015 0.2  0 - 0.7 K/uL Final  . Basophils Relative 05/16/2015 1   Final  . Basophils Absolute 05/16/2015 0.0  0 - 0.1 K/uL Final  . Sodium 05/16/2015 139  135 - 145 mmol/L Final  . Potassium 05/16/2015 3.5  3.5 - 5.1 mmol/L Final  . Chloride 05/16/2015 108  101 - 111 mmol/L Final  . CO2 05/16/2015 27  22 - 32 mmol/L Final  . Glucose, Bld 05/16/2015 144* 65 - 99 mg/dL Final  . BUN 05/16/2015 18  6 - 20 mg/dL Final  . Creatinine, Ser 05/16/2015 1.46* 0.61 - 1.24 mg/dL Final  . Calcium 05/16/2015 9.2  8.9 - 10.3 mg/dL Final  . Total Protein 05/16/2015 7.6  6.5 - 8.1 g/dL Final  . Albumin 05/16/2015 4.2  3.5 - 5.0 g/dL Final  . AST 05/16/2015 27  15 - 41 U/L Final  . ALT 05/16/2015 22  17 - 63 U/L Final  . Alkaline Phosphatase 05/16/2015 90  38 - 126 U/L Final  . Total Bilirubin 05/16/2015 0.9  0.3 - 1.2 mg/dL Final  . GFR calc non Af Amer 05/16/2015 48* >60 mL/min Final  . GFR calc Af Amer 05/16/2015 55* >60 mL/min Final   Comment: (NOTE) The eGFR has been calculated using the CKD  EPI equation. This calculation has not been validated in all clinical situations. eGFR's persistently <60 mL/min signify possible Chronic Kidney Disease.   Georgiann Hahn gap 05/16/2015 4* 5 - 15 Final    Assessment:  Sebastain Fishbaugh. is a 70 y.o. male with a stage IIIC colon cancer. He presented with symptomatic anemia.  He underwent descending and proximal sigmoid colectomy, left ureteral lysis, and partial cecectomy on 06/23/2013. Six of 22 lymph nodes were positive.  There were tumor deposits (discontinuous extramural extension).  There was lymphovascular and perineural invasion. Pathologic stage was IIIC (T4bN2a M0).  He received FOLFOX chemotherapy from 07/22/2013 until 01/06/2014.   Chest, abdomen, and pelvic CT scan on 01/11/2014 revealed no evidence of metastatic disease. He was noted to have stable splenomegaly. There was some progression of tree-in-bud changes in the lungs, questionable MAC infection. There were chronic inflammatory changes.  Chest, abdomen, and pelvic CT scan on 01/12/2015 revealed no evidence of metastatic disease in the chest, abdomen or pelvis.  There was a solitary 4 mm right upper lobe pulmonary nodule (stable). There was mild cardiomegaly (increased).  There was mild patchy ground-glass opacity and interlobular septal thickening throughout both lungs, suggestive of pulmonary edema, concerning for developing congestive heart failure.  There was stable dilated main pulmonary artery, suggesting pulmonary arterial hypertension.  There was left main and 3 vessel coronary atherosclerosis.    CEA was 4.3 on 06/20/2013, 1.8 on 01/06/2014, 1.5 on 09/16/2014, and 1.1 on 01/14/2015.  Colonoscopy on 04/12/2014 revealed several polyps.  There was no dysplasia or malignancy. Lower extremity duplex on 06/15/2014 revealed no evidence of DVT.  Echocardiogram revealed an EF was 20-25% on 02/18/2015.  Cardiac catheterization on 02/24/2015 revealed 80% occlusion of the proximal left  circumflex.  He is being managed medically.  Symptomatically, he has a residual neuropathy secondary to oxaliplatin. He is on Neurontin.  Exam is unremarkable.  Plan: 1.  Labs today: CBC with diff, CMP, CEA.  2.  Continue Neurontin. 3.  RTC in 4 months for MD assessment and labs (CBC with diff, CMP, CEA).   Lequita Asal,  MD  05/16/2015, 3:34 PM

## 2015-05-17 ENCOUNTER — Encounter: Payer: Self-pay | Admitting: Hematology and Oncology

## 2015-05-17 LAB — CEA: CEA: 1.5 ng/mL (ref 0.0–4.7)

## 2015-05-24 ENCOUNTER — Other Ambulatory Visit: Payer: Self-pay | Admitting: Family Medicine

## 2015-05-25 ENCOUNTER — Telehealth: Payer: Self-pay | Admitting: Surgical

## 2015-05-25 NOTE — Telephone Encounter (Signed)
Left message for patient to call and schedule a follow up appointment with Dr. Caryl Bis

## 2015-05-30 ENCOUNTER — Encounter: Payer: Self-pay | Admitting: Cardiovascular Disease

## 2015-05-30 ENCOUNTER — Telehealth: Payer: Self-pay | Admitting: Cardiovascular Disease

## 2015-05-30 ENCOUNTER — Ambulatory Visit (INDEPENDENT_AMBULATORY_CARE_PROVIDER_SITE_OTHER): Payer: Medicare HMO | Admitting: Cardiovascular Disease

## 2015-05-30 VITALS — BP 140/70 | HR 55 | Ht 69.0 in | Wt 182.2 lb

## 2015-05-30 DIAGNOSIS — I5022 Chronic systolic (congestive) heart failure: Secondary | ICD-10-CM

## 2015-05-30 DIAGNOSIS — I2510 Atherosclerotic heart disease of native coronary artery without angina pectoris: Secondary | ICD-10-CM

## 2015-05-30 DIAGNOSIS — Z01812 Encounter for preprocedural laboratory examination: Secondary | ICD-10-CM

## 2015-05-30 MED ORDER — FUROSEMIDE 40 MG PO TABS: 40.0000 mg | ORAL_TABLET | Freq: Every day | ORAL | Status: DC

## 2015-05-30 MED ORDER — LISINOPRIL 40 MG PO TABS: 40.0000 mg | ORAL_TABLET | Freq: Every day | ORAL | Status: DC

## 2015-05-30 MED ORDER — CLOPIDOGREL BISULFATE 75 MG PO TABS: 75.0000 mg | ORAL_TABLET | Freq: Every day | ORAL | Status: DC

## 2015-05-30 NOTE — Progress Notes (Signed)
Cardiology Office Note   Date:  05/30/2015   ID:  Christopher Orleans., DOB [birth year], MRN [id number]  PCP:  Tommi Rumps, MD  Cardiologist:   Kathlyn Sacramento, MD   Chief Complaint  Patient presents with  . other    No complaints. Meds reviewed verbally with pt.      History of Present Illness: Christopher Butland. is a 70 y.o. male who presents for A follow-up visit regarding chronic systolic heart failure and coronary artery disease. He has known history of colon cancer status post colectomy in 2015 followed by chemotherapy with oxaliplatin and fluorouracil. He had neuropathy as a result.  He is not a smoker and has not had any alcohol in 10 years. He is not diabetic.  He was seen in January for acute systolic heart failure with significant volume overload. Echocardiogram showed severely reduced LV systolic function with an ejection fraction of 20-25%, moderate mitral regurgitation, moderate tricuspid regurgitation with moderate to severe pulmonary hypertension. I proceeded with a right and left cardiac catheterization which showed moderate to severe pulmonary hypertension with severely elevated filling pressures with severely reduced cardiac output. Coronary angiography showed severe one-vessel coronary artery disease with 80-90% stenosis in the proximal left circumflex with mild diffuse disease affecting the LAD and RCA. Subsequently, the dose of furosemide was increased. The patient was started on treatment with carvedilol and lisinopril. He had gradual improvement in his symptoms especially leg edema and venous stasis. He feels significantly better but he continues to have significant exertional dyspnea with no orthopnea or PND. No chest pain.   Past Medical History  Diagnosis Date  . Hypertensive heart disease   . Colon cancer ([name])     a. 2015 s/p colectomy followed by chemoRx with oxaliplatin & fluorouacil.  . Anemia   . Mixed Ischemic and Non-ischemic Cardiomyopathy     a.  02/2015 Echo: EF 20-25%, diff HK.  . Chronic systolic CHF (congestive heart failure) ([place])     a. 02/2015 Echo: EF 20-25%, diff HK, mod MR, mildy dil LA, mildly dil RV w/ mod RV syst dysfxn, mildly dil RA, mod TR, PASP 62mmHg.  . Moderate mitral regurgitation   . Moderate tricuspid regurgitation   . CAD (coronary artery disease)     a. 02/2015 Cath: LM nl, LAD 50/40 mid/distal, LCX 80p, RCA 40p, 30d, RPL1 40, CO 3.27, CI 1.65.    Past Surgical History  Procedure Laterality Date  . Portacath placement Left   . Colon surgery      Colectomy  . Port-a-cath removal N/A 07/12/2014    Procedure: REMOVAL PORT-A-CATH;  Surgeon: Molly Maduro, MD;  Location: [facility] ORS;  Service: General;  Laterality: N/A;  . Cardiac catheterization Bilateral 02/24/2015    Procedure: Right/Left Heart Cath and Coronary Angiography;  Surgeon: Wellington Hampshire, MD;  Location: [facility];  Service: Cardiovascular;  Laterality: Bilateral;     Current Outpatient Prescriptions  Medication Sig Dispense Refill  . aspirin EC 81 MG tablet Take 1 tablet (81 mg total) by mouth daily. 90 tablet 3  . atorvastatin (LIPITOR) 40 MG tablet Take 1 tablet (40 mg total) by mouth daily. 30 tablet 3  . carvedilol (COREG) 3.125 MG tablet Take 1 tablet (3.125 mg total) by mouth 2 (two) times daily. 60 tablet 3  . gabapentin (NEURONTIN) 100 MG capsule Take 2 capsules (200 mg total) by mouth 3 (three) times daily. (Patient taking differently: Take 100 mg by mouth 3 (three)  times daily. ) 180 capsule 0  . clopidogrel (PLAVIX) 75 MG tablet Take 1 tablet (75 mg total) by mouth daily. 30 tablet 3  . furosemide (LASIX) 40 MG tablet Take 1 tablet (40 mg total) by mouth daily. 30 tablet 3  . lisinopril (PRINIVIL,ZESTRIL) 40 MG tablet Take 1 tablet (40 mg total) by mouth daily. 30 tablet 3   No current facility-administered medications for this visit.    Allergies:   Shellfish allergy    Social History:  The patient  reports that he has  never smoked. He has never used smokeless tobacco. He reports that he does not drink alcohol or use illicit drugs.   Family History:  The patient's Family history is unknown by patient.    ROS:  Please see the history of present illness.   Otherwise, review of systems are positive for none.   All other systems are reviewed and negative.    PHYSICAL EXAM: VS:  BP 140/70 mmHg  Pulse 55  Ht 5\' 9"  (1.753 m)  Wt 182 lb 4 oz (82.668 kg)  BMI 26.90 kg/m2 , BMI Body mass index is 26.9 kg/(m^2). GEN: Well nourished, well developed, in no acute distress HEENT: normal Neck: no JVD, carotid bruits, or masses Cardiac: RRR; no murmurs, rubs, or gallops,no edema  Respiratory:  clear to auscultation bilaterally, normal work of breathing GI: soft, nontender, nondistended, + BS MS: no deformity or atrophy Skin: warm and dry, no rash Neuro:  Strength and sensation are intact Psych: euthymic mood, full affect   EKG:  EKG is not ordered today.    Recent Labs: 02/10/2015: Pro B Natriuretic peptide (BNP) 2826.0*; TSH 1.98 05/16/2015: ALT 22; BUN 18; Creatinine, Ser 1.46*; Hemoglobin 13.3; Platelets 130*; Potassium 3.5; Sodium 139    Lipid Panel No results found for: CHOL, TRIG, HDL, CHOLHDL, VLDL, LDLCALC, LDLDIRECT    Wt Readings from Last 3 Encounters:  05/30/15 182 lb 4 oz (82.668 kg)  05/16/15 177 lb 11.1 oz (80.6 kg)  04/14/15 180 lb 8 oz (81.874 kg)        ASSESSMENT AND PLAN:  1.  Coronary artery disease involving native coronary arteries with other forms of angina: The patient's exertional dyspnea is likely angina equivalent. I reviewed the angiogram with him. The area supplied by the left circumflex is relatively large. PCI on the left circumflex might improve his symptoms and his LV systolic function. I discussed with him risks and benefits and recommended proceeding with PCI of the proximal left circumflex. I started him on Plavix 75 mg once daily.  2. Chronic systolic heart  failure: Volume overload improved significantly with diuresis. He has been taking furosemide 20 mg twice daily. I changed this to 40 mg once daily. I also increased his lisinopril to 40 mg once daily. He does have mild chronic kidney disease. Continue small dose carvedilol. The dose cannot be increased due to bradycardia. I elected not to add digoxin due to chronic kidney disease.  3. Essential hypertension: Lisinopril was increased today.  4. Hyperlipidemia: Continue treatment with atorvastatin with a target LDL of less than 70.    Disposition:   FU with me in 1 month  Signed,  Kathlyn Sacramento, MD  05/30/2015 5:20 PM    [facility]

## 2015-05-30 NOTE — Telephone Encounter (Signed)
PCI scheduled for May 4, 9:30am, arrival 8:30am. Per pt request, please leave message on VM regarding arrival time. Left message for pt.

## 2015-05-30 NOTE — Patient Instructions (Addendum)
Medication Instructions:  Your physician has recommended you make the following change in your medication:  START taking plavix 75mg  once daily INCREASE lisinopril to 40mg  once daily   Labwork: BMET, CBC, PT/INR  Testing/Procedures: Your physician has requested that you have a cardiac catheterization. Cardiac catheterization is used to diagnose and/or treat various heart conditions. Doctors may recommend this procedure for a number of different reasons. The most common reason is to evaluate chest pain. Chest pain can be a symptom of coronary artery disease (CAD), and cardiac catheterization can show whether plaque is narrowing or blocking your heart's arteries. This procedure is also used to evaluate the valves, as well as measure the blood flow and oxygen levels in different parts of your heart. For further information please visit [employer]. Please follow instruction sheet, as given.  [facility] Cardiac Cath Instructions   You are scheduled for a Cardiac Cath on: Thursday, May 4  Please arrive at _______am on the day of your procedure  You will need to pre-register prior to the day of your procedure.  Enter through the [employer] at [facility].  Registration is the first desk on your right.  Please take the procedure order we have given you in order to be registered appropriately  Do not eat/drink anything after midnight  Someone will need to drive you home  It is recommended someone be with you for the first 24 hours after your procedure  Wear clothes that are easy to get on/off and wear slip on shoes if possible   Medications bring a current list of all medications with you  __xx_ You may take all of your medications the morning of your procedure with enough water to swallow safely   Day of your procedure: Arrive at the [place] entrance.  Free valet service is available.  After entering the [place] please check-in at the registration desk (1st desk on your right) to  receive your armband. After receiving your armband someone will escort you to the cardiac cath/special procedures waiting area.  The usual length of stay after your procedure is about 2 to 3 hours.  This can vary.  If you have any questions, please call our office at [phone], or you may call the cardiac cath lab at [facility] directly at [phone]   Follow-Up: Your physician recommends that you schedule a follow-up appointment in: 2 weeks with Dr. Fletcher Anon.    Any Other Special Instructions Will Be Listed Below (If Applicable).     If you need a refill on your cardiac medications before your next appointment, please call your pharmacy.  Angiogram An angiogram, also called angiography, is a procedure used to look at the blood vessels. In this procedure, dye is injected through a long, thin tube (catheter) into an artery. X-rays are then taken. The X-rays will show if there is a blockage or problem in a blood vessel.  LET [facility] CARE PROVIDER KNOW ABOUT:  Any allergies you have, including allergies to shellfish or contrast dye.   All medicines you are taking, including vitamins, herbs, eye drops, creams, and over-the-counter medicines.   Previous problems you or members of your family have had with the use of anesthetics.   Any blood disorders you have.   Previous surgeries you have had.  Any previous kidney problems or failure you have had.  Medical conditions you have.   Possibility of pregnancy, if this applies. RISKS AND COMPLICATIONS Generally, an angiogram is a safe procedure. However, as with any procedure,  problems can occur. Possible problems include:  Injury to the blood vessels, including rupture or bleeding.  Infection or bruising at the catheter site.  Allergic reaction to the dye or contrast used.  Kidney damage from the dye or contrast used.  Blood clots that can lead to a stroke or heart attack. BEFORE THE PROCEDURE  Do not eat or drink after  midnight on the night before the procedure, or as directed by your health care provider.   Ask your health care provider if you may drink enough water to take any needed medicines the morning of the procedure.  PROCEDURE  You may be given a medicine to help you relax (sedative) before and during the procedure. This medicine is given through an IV access tube that is inserted into one of your veins.   The area where the catheter will be inserted will be washed and shaved. This is usually done in the groin but may be done in the fold of your arm (near your elbow) or in the wrist.  A medicine will be given to numb the area where the catheter will be inserted (local anesthetic).  The catheter will be inserted with a guide wire into an artery. The catheter is guided by using a type of X-ray (fluoroscopy) to the blood vessel being examined.   Dye is then injected into the catheter, and X-rays are taken. The dye helps to show where any narrowing or blockages are located.  AFTER THE PROCEDURE   If the procedure is done through the leg, you will be kept in bed lying flat for several hours. You will be instructed to not bend or cross your legs.  The insertion site will be checked frequently.  The pulse in your feet or wrist will be checked frequently.  Additional blood tests, X-rays, and electrocardiography may be done.   You may need to stay in the hospital overnight for observation.    This information is not intended to replace advice given to you by your health care provider. Make sure you discuss any questions you have with your health care provider.   Document Released: 11/01/2004 Document Revised: 02/12/2014 Document Reviewed: 06/25/2012 Elsevier Interactive Patient Education [employer].

## 2015-05-31 LAB — BASIC METABOLIC PANEL
BUN/Creatinine Ratio: 15 (ref 10–24)
BUN: 16 mg/dL (ref 8–27)
CALCIUM: 9.4 mg/dL (ref 8.6–10.2)
CHLORIDE: 104 mmol/L (ref 96–106)
CO2: 22 mmol/L (ref 18–29)
Creatinine, Ser: 1.09 mg/dL (ref 0.76–1.27)
GFR calc Af Amer: 80 mL/min/{1.73_m2} (ref >59)
GFR calc non Af Amer: 69 mL/min/{1.73_m2} (ref >59)
GLUCOSE: 180 mg/dL — AB (ref 65–99)
POTASSIUM: 3.9 mmol/L (ref 3.5–5.2)
Sodium: 141 mmol/L (ref 134–144)

## 2015-05-31 LAB — PROTIME-INR
INR: 1.1 (ref 0.8–1.2)
PROTHROMBIN TIME: 11 s (ref 9.1–12.0)

## 2015-05-31 LAB — CBC
HEMOGLOBIN: 13.3 g/dL (ref 12.6–17.7)
Hematocrit: 38.8 % (ref 37.5–51.0)
MCH: 31.6 pg (ref 26.6–33.0)
MCHC: 34.3 g/dL (ref 31.5–35.7)
MCV: 92 fL (ref 79–97)
PLATELETS: 151 10*3/uL (ref 150–379)
RBC: 4.21 x10E6/uL (ref 4.14–5.80)
RDW: 15.7 % — AB (ref 12.3–15.4)
WBC: 3.9 10*3/uL (ref 3.4–10.8)

## 2015-06-02 ENCOUNTER — Telehealth: Payer: Self-pay | Admitting: Cardiovascular Disease

## 2015-06-02 NOTE — Telephone Encounter (Signed)
Left detailed message on pt home VM of time change for 5/4 cath. Pt needs to arrive at 7:30am as cath is now scheduled for 8:30. Requests pt to CB to confirm receipt of message.

## 2015-06-06 NOTE — Telephone Encounter (Signed)
Left message on pt VM as requested.  5/4 cath arrival time 7:30am.  Requests pt to CB to confirm receipt of message.

## 2015-06-07 NOTE — Telephone Encounter (Signed)
Left detailed message w/new arrival time for 5/4 cath on pt home VM.  Requested CB to confirm receipt of the message.

## 2015-06-08 ENCOUNTER — Telehealth: Payer: Self-pay | Admitting: Cardiovascular Disease

## 2015-06-08 NOTE — Telephone Encounter (Signed)
Attempted to contact pt wife regarding 5/4 procedure. No answer, no VM.

## 2015-06-08 NOTE — Telephone Encounter (Signed)
Left detailed message on pt VM regarding cath/PCI May 4. Reviewed time, location, details along w/CB number if questions.

## 2015-06-09 ENCOUNTER — Encounter: Payer: Self-pay | Admitting: *Deleted

## 2015-06-09 ENCOUNTER — Observation Stay
Admission: RE | Admit: 2015-06-09 | Discharge: 2015-06-10 | Disposition: A | Discharge status: Home / Self Care | Payer: Medicare HMO | Source: Ambulatory Visit | Attending: Cardiovascular Disease | Admitting: Cardiovascular Disease

## 2015-06-09 ENCOUNTER — Encounter
Admission: RE | Disposition: A | Discharge status: Home / Self Care | Payer: Self-pay | Source: Ambulatory Visit | Attending: Cardiovascular Disease

## 2015-06-09 DIAGNOSIS — I4581 Long QT syndrome: Secondary | ICD-10-CM | POA: Diagnosis not present

## 2015-06-09 DIAGNOSIS — Z9049 Acquired absence of other specified parts of digestive tract: Secondary | ICD-10-CM | POA: Diagnosis not present

## 2015-06-09 DIAGNOSIS — I2089 Other forms of angina pectoris: Secondary | ICD-10-CM | POA: Diagnosis present

## 2015-06-09 DIAGNOSIS — I429 Cardiomyopathy, unspecified: Secondary | ICD-10-CM

## 2015-06-09 DIAGNOSIS — E785 Hyperlipidemia, unspecified: Secondary | ICD-10-CM | POA: Diagnosis not present

## 2015-06-09 DIAGNOSIS — Z955 Presence of coronary angioplasty implant and graft: Secondary | ICD-10-CM

## 2015-06-09 DIAGNOSIS — N182 Chronic kidney disease, stage 2 (mild): Secondary | ICD-10-CM | POA: Insufficient documentation

## 2015-06-09 DIAGNOSIS — Z91013 Allergy to seafood: Secondary | ICD-10-CM | POA: Diagnosis not present

## 2015-06-09 DIAGNOSIS — I5022 Chronic systolic (congestive) heart failure: Secondary | ICD-10-CM | POA: Diagnosis not present

## 2015-06-09 DIAGNOSIS — R0600 Dyspnea, unspecified: Secondary | ICD-10-CM | POA: Insufficient documentation

## 2015-06-09 DIAGNOSIS — I4510 Unspecified right bundle-branch block: Secondary | ICD-10-CM | POA: Insufficient documentation

## 2015-06-09 DIAGNOSIS — Z7982 Long term (current) use of aspirin: Secondary | ICD-10-CM | POA: Insufficient documentation

## 2015-06-09 DIAGNOSIS — Z9221 Personal history of antineoplastic chemotherapy: Secondary | ICD-10-CM | POA: Insufficient documentation

## 2015-06-09 DIAGNOSIS — E876 Hypokalemia: Secondary | ICD-10-CM | POA: Diagnosis not present

## 2015-06-09 DIAGNOSIS — I2510 Atherosclerotic heart disease of native coronary artery without angina pectoris: Secondary | ICD-10-CM | POA: Diagnosis not present

## 2015-06-09 DIAGNOSIS — Z85038 Personal history of other malignant neoplasm of large intestine: Secondary | ICD-10-CM | POA: Diagnosis not present

## 2015-06-09 DIAGNOSIS — I10 Essential (primary) hypertension: Secondary | ICD-10-CM | POA: Diagnosis not present

## 2015-06-09 DIAGNOSIS — R001 Bradycardia, unspecified: Secondary | ICD-10-CM | POA: Insufficient documentation

## 2015-06-09 DIAGNOSIS — I130 Hypertensive heart and chronic kidney disease with heart failure and stage 1 through stage 4 chronic kidney disease, or unspecified chronic kidney disease: Secondary | ICD-10-CM | POA: Diagnosis not present

## 2015-06-09 DIAGNOSIS — D649 Anemia, unspecified: Secondary | ICD-10-CM | POA: Insufficient documentation

## 2015-06-09 DIAGNOSIS — I081 Rheumatic disorders of both mitral and tricuspid valves: Secondary | ICD-10-CM | POA: Diagnosis not present

## 2015-06-09 DIAGNOSIS — Z79899 Other long term (current) drug therapy: Secondary | ICD-10-CM | POA: Diagnosis not present

## 2015-06-09 DIAGNOSIS — I25118 Atherosclerotic heart disease of native coronary artery with other forms of angina pectoris: Secondary | ICD-10-CM | POA: Diagnosis not present

## 2015-06-09 DIAGNOSIS — I255 Ischemic cardiomyopathy: Secondary | ICD-10-CM | POA: Insufficient documentation

## 2015-06-09 DIAGNOSIS — I272 Other secondary pulmonary hypertension: Secondary | ICD-10-CM | POA: Diagnosis not present

## 2015-06-09 DIAGNOSIS — I208 Other forms of angina pectoris: Secondary | ICD-10-CM | POA: Diagnosis present

## 2015-06-09 DIAGNOSIS — I119 Hypertensive heart disease without heart failure: Secondary | ICD-10-CM | POA: Diagnosis present

## 2015-06-09 HISTORY — PX: CARDIAC CATHETERIZATION: SHX172

## 2015-06-09 HISTORY — DX: Presence of coronary angioplasty implant and graft: Z95.5

## 2015-06-09 SURGERY — CORONARY STENT INTERVENTION

## 2015-06-09 MED ORDER — SODIUM CHLORIDE 0.9 % IV SOLN
250.0000 mg | INTRAVENOUS | Status: DC | PRN
  Administered 2015-06-09: 1.75 mg/kg/h via INTRAVENOUS

## 2015-06-09 MED ORDER — CARVEDILOL 3.125 MG PO TABS
3.1250 mg | ORAL_TABLET | Freq: Two times a day (BID) | ORAL | Status: DC
  Administered 2015-06-09 – 2015-06-10 (×2): 3.125 mg via ORAL
  Filled 2015-06-09 (×2): qty 1

## 2015-06-09 MED ORDER — CLOPIDOGREL BISULFATE 75 MG PO TABS
ORAL_TABLET | ORAL | Status: AC
  Filled 2015-06-09: qty 4

## 2015-06-09 MED ORDER — VERAPAMIL HCL 2.5 MG/ML IV SOLN
INTRAVENOUS | Status: DC | PRN
Start: 2015-06-09 — End: 2015-06-09
  Administered 2015-06-09: 2.5 mg via INTRA_ARTERIAL

## 2015-06-09 MED ORDER — SODIUM CHLORIDE 0.9 % IV SOLN: 250.0000 mL | INTRAVENOUS | Status: DC | PRN

## 2015-06-09 MED ORDER — HEPARIN SODIUM (PORCINE) 1000 UNIT/ML IJ SOLN
INTRAMUSCULAR | Status: AC
  Filled 2015-06-09: qty 1

## 2015-06-09 MED ORDER — ASPIRIN EC 81 MG PO TBEC
81.0000 mg | DELAYED_RELEASE_TABLET | Freq: Every day | ORAL | Status: DC
  Administered 2015-06-10: 81 mg via ORAL
  Filled 2015-06-09: qty 1

## 2015-06-09 MED ORDER — BIVALIRUDIN BOLUS VIA INFUSION - CUPID
INTRAVENOUS | Status: DC | PRN
  Administered 2015-06-09: 63.6 mg via INTRAVENOUS

## 2015-06-09 MED ORDER — FENTANYL CITRATE (PF) 100 MCG/2ML IJ SOLN
INTRAMUSCULAR | Status: AC
  Filled 2015-06-09: qty 2

## 2015-06-09 MED ORDER — CLOPIDOGREL BISULFATE 75 MG PO TABS
75.0000 mg | ORAL_TABLET | Freq: Every day | ORAL | Status: DC
  Administered 2015-06-10: 75 mg via ORAL
  Filled 2015-06-09: qty 1

## 2015-06-09 MED ORDER — FAMOTIDINE 20 MG PO TABS
ORAL_TABLET | ORAL | Status: AC
  Administered 2015-06-09: 40 mg
  Filled 2015-06-09: qty 2

## 2015-06-09 MED ORDER — SODIUM CHLORIDE 0.9% FLUSH: 3.0000 mL | INTRAVENOUS | Status: DC | PRN

## 2015-06-09 MED ORDER — MIDAZOLAM HCL 2 MG/2ML IJ SOLN
INTRAMUSCULAR | Status: DC | PRN
Start: 2015-06-09 — End: 2015-06-09
  Administered 2015-06-09: 1 mg via INTRAVENOUS

## 2015-06-09 MED ORDER — VERAPAMIL HCL 2.5 MG/ML IV SOLN
INTRAVENOUS | Status: AC
  Filled 2015-06-09: qty 2

## 2015-06-09 MED ORDER — LISINOPRIL 20 MG PO TABS
40.0000 mg | ORAL_TABLET | Freq: Every day | ORAL | Status: DC
  Administered 2015-06-10: 40 mg via ORAL
  Filled 2015-06-09: qty 2

## 2015-06-09 MED ORDER — SODIUM CHLORIDE 0.9% FLUSH
3.0000 mL | Freq: Two times a day (BID) | INTRAVENOUS | Status: DC
Start: 2015-06-09 — End: 2015-06-10
  Administered 2015-06-09: 3 mL via INTRAVENOUS

## 2015-06-09 MED ORDER — SODIUM CHLORIDE 0.9% FLUSH
3.0000 mL | Freq: Two times a day (BID) | INTRAVENOUS | Status: DC
  Administered 2015-06-09: 3 mL via INTRAVENOUS

## 2015-06-09 MED ORDER — IOPAMIDOL (ISOVUE-300) INJECTION 61%
INTRAVENOUS | Status: DC | PRN
  Administered 2015-06-09: 70 mL via INTRA_ARTERIAL

## 2015-06-09 MED ORDER — METHYLPREDNISOLONE SODIUM SUCC 125 MG IJ SOLR
INTRAMUSCULAR | Status: AC
  Administered 2015-06-09: 125 mg
  Filled 2015-06-09: qty 2

## 2015-06-09 MED ORDER — MIDAZOLAM HCL 2 MG/2ML IJ SOLN
INTRAMUSCULAR | Status: AC
  Filled 2015-06-09: qty 2

## 2015-06-09 MED ORDER — ACETAMINOPHEN 325 MG PO TABS: 650.0000 mg | ORAL_TABLET | ORAL | Status: DC | PRN

## 2015-06-09 MED ORDER — FENTANYL CITRATE (PF) 100 MCG/2ML IJ SOLN
INTRAMUSCULAR | Status: DC | PRN
Start: 2015-06-09 — End: 2015-06-09
  Administered 2015-06-09: 25 ug via INTRAVENOUS

## 2015-06-09 MED ORDER — HEPARIN (PORCINE) IN NACL 2-0.9 UNIT/ML-% IJ SOLN
INTRAMUSCULAR | Status: AC
  Filled 2015-06-09: qty 1000

## 2015-06-09 MED ORDER — CLOPIDOGREL BISULFATE 75 MG PO TABS
ORAL_TABLET | ORAL | Status: DC | PRN
Start: 2015-06-09 — End: 2015-06-09
  Administered 2015-06-09: 300 mg via ORAL

## 2015-06-09 MED ORDER — ONDANSETRON HCL 4 MG/2ML IJ SOLN: 4.0000 mg | Freq: Four times a day (QID) | INTRAMUSCULAR | Status: DC | PRN

## 2015-06-09 MED ORDER — BIVALIRUDIN 250 MG IV SOLR
INTRAVENOUS | Status: AC
  Filled 2015-06-09: qty 250

## 2015-06-09 MED ORDER — NITROGLYCERIN 5 MG/ML IV SOLN
INTRAVENOUS | Status: AC
  Filled 2015-06-09: qty 10

## 2015-06-09 MED ORDER — FUROSEMIDE 40 MG PO TABS
40.0000 mg | ORAL_TABLET | Freq: Every day | ORAL | Status: DC
  Administered 2015-06-10: 40 mg via ORAL
  Filled 2015-06-09: qty 1

## 2015-06-09 MED ORDER — GABAPENTIN 100 MG PO CAPS
100.0000 mg | ORAL_CAPSULE | Freq: Three times a day (TID) | ORAL | Status: DC
  Administered 2015-06-09 – 2015-06-10 (×2): 100 mg via ORAL
  Filled 2015-06-09 (×2): qty 1

## 2015-06-09 MED ORDER — SODIUM CHLORIDE 0.9% FLUSH
3.0000 mL | Freq: Two times a day (BID) | INTRAVENOUS | Status: DC
  Administered 2015-06-09 (×2): 3 mL via INTRAVENOUS

## 2015-06-09 MED ORDER — SODIUM CHLORIDE 0.9 % IV SOLN
INTRAVENOUS | Status: DC
  Administered 2015-06-09: 08:00:00 via INTRAVENOUS

## 2015-06-09 MED ORDER — ATORVASTATIN CALCIUM 20 MG PO TABS
40.0000 mg | ORAL_TABLET | Freq: Every day | ORAL | Status: DC
  Administered 2015-06-10: 40 mg via ORAL
  Filled 2015-06-09: qty 2

## 2015-06-09 SURGICAL SUPPLY — 12 items
BALLN TREK RX 2.5X12 (BALLOONS) ×2
BALLOON TREK RX 2.5X12 (BALLOONS) ×1 IMPLANT
CATH OPTITORQUE JACKY 4.0 5F (CATHETERS) ×2 IMPLANT
CATH VISTA GUIDE 6FR XB3 (CATHETERS) ×2 IMPLANT
DEVICE INFLAT 30 PLUS (MISCELLANEOUS) ×2 IMPLANT
DEVICE RAD TR BAND REGULAR (VASCULAR PRODUCTS) ×2 IMPLANT
GLIDESHEATH SLEND SS 6F .021 (SHEATH) ×2 IMPLANT
KIT MANI 3VAL PERCEP (MISCELLANEOUS) ×2 IMPLANT
PACK CARDIAC CATH (CUSTOM PROCEDURE TRAY) ×2 IMPLANT
STENT XIENCE ALPINE RX 3.0X12 (Permanent Stent) ×2 IMPLANT
WIRE RUNTHROUGH .014X180CM (WIRE) ×2 IMPLANT
WIRE SAFE-T 1.5MM-J .035X260CM (WIRE) ×2 IMPLANT

## 2015-06-09 NOTE — H&P (View-Only) (Signed)
Cardiology Office Note   Date:  05/30/2015   ID:  [name]., DOB December 05, 1945, MRN [id number]  PCP:  Christopher Rumps, MD  Cardiologist:   Christopher Sacramento, MD   Chief Complaint  Patient presents with  . other    No complaints. Meds reviewed verbally with pt.      History of Present Illness: [name]. is a 70 y.o. male who presents for A follow-up visit regarding chronic systolic heart failure and coronary artery disease. He has known history of colon cancer status post colectomy in 2015 followed by chemotherapy with oxaliplatin and fluorouracil. He had neuropathy as a result.  He is not a smoker and has not had any alcohol in 10 years. He is not diabetic.  He was seen in January for acute systolic heart failure with significant volume overload. Echocardiogram showed severely reduced LV systolic function with an ejection fraction of 20-25%, moderate mitral regurgitation, moderate tricuspid regurgitation with moderate to severe pulmonary hypertension. I proceeded with a right and left cardiac catheterization which showed moderate to severe pulmonary hypertension with severely elevated filling pressures with severely reduced cardiac output. Coronary angiography showed severe one-vessel coronary artery disease with 80-90% stenosis in the proximal left circumflex with mild diffuse disease affecting the LAD and RCA. Subsequently, the dose of furosemide was increased. The patient was started on treatment with carvedilol and lisinopril. He had gradual improvement in his symptoms especially leg edema and venous stasis. He feels significantly better but he continues to have significant exertional dyspnea with no orthopnea or PND. No chest pain.   Past Medical History  Diagnosis Date  . Hypertensive heart disease   . Colon cancer ([place])     a. 2015 s/p colectomy followed by chemoRx with oxaliplatin & fluorouacil.  . Anemia   . Mixed Ischemic and Non-ischemic Cardiomyopathy     a.  02/2015 Echo: EF 20-25%, diff HK.  . Chronic systolic CHF (congestive heart failure) ([place])     a. 02/2015 Echo: EF 20-25%, diff HK, mod MR, mildy dil LA, mildly dil RV w/ mod RV syst dysfxn, mildly dil RA, mod TR, PASP 55mmHg.  . Moderate mitral regurgitation   . Moderate tricuspid regurgitation   . CAD (coronary artery disease)     a. 02/2015 Cath: LM nl, LAD 50/40 mid/distal, LCX 80p, RCA 40p, 30d, RPL1 40, CO 3.27, CI 1.65.    Past Surgical History  Procedure Laterality Date  . Portacath placement Left   . Colon surgery      Colectomy  . Port-a-cath removal N/A 07/12/2014    Procedure: REMOVAL PORT-A-CATH;  Surgeon: Christopher Maduro, MD;  Location: [facility];  Service: General;  Laterality: N/A;  . Cardiac catheterization Bilateral 02/24/2015    Procedure: Right/Left Heart Cath and Coronary Angiography;  Surgeon: Christopher Hampshire, MD;  Location: [facility];  Service: Cardiovascular;  Laterality: Bilateral;     Current Outpatient Prescriptions  Medication Sig Dispense Refill  . aspirin EC 81 MG tablet Take 1 tablet (81 mg total) by mouth daily. 90 tablet 3  . atorvastatin (LIPITOR) 40 MG tablet Take 1 tablet (40 mg total) by mouth daily. 30 tablet 3  . carvedilol (COREG) 3.125 MG tablet Take 1 tablet (3.125 mg total) by mouth 2 (two) times daily. 60 tablet 3  . gabapentin (NEURONTIN) 100 MG capsule Take 2 capsules (200 mg total) by mouth 3 (three) times daily. (Patient taking differently: Take 100 mg by mouth 3 (three)  times daily. ) 180 capsule 0  . clopidogrel (PLAVIX) 75 MG tablet Take 1 tablet (75 mg total) by mouth daily. 30 tablet 3  . furosemide (LASIX) 40 MG tablet Take 1 tablet (40 mg total) by mouth daily. 30 tablet 3  . lisinopril (PRINIVIL,ZESTRIL) 40 MG tablet Take 1 tablet (40 mg total) by mouth daily. 30 tablet 3   No current facility-administered medications for this visit.    Allergies:   Shellfish allergy    Social History:  The patient  reports that he has  never smoked. He has never used smokeless tobacco. He reports that he does not drink alcohol or use illicit drugs.   Family History:  The patient's Family history is unknown by patient.    ROS:  Please see the history of present illness.   Otherwise, review of systems are positive for none.   All other systems are reviewed and negative.    PHYSICAL EXAM: VS:  BP 140/70 mmHg  Pulse 55  Ht 5\' 9"  (1.753 m)  Wt 182 lb 4 oz (82.668 kg)  BMI 26.90 kg/m2 , BMI Body mass index is 26.9 kg/(m^2). GEN: Well nourished, well developed, in no acute distress HEENT: normal Neck: no JVD, carotid bruits, or masses Cardiac: RRR; no murmurs, rubs, or gallops,no edema  Respiratory:  clear to auscultation bilaterally, normal work of breathing GI: soft, nontender, nondistended, + BS MS: no deformity or atrophy Skin: warm and dry, no rash Neuro:  Strength and sensation are intact Psych: euthymic mood, full affect   EKG:  EKG is not ordered today.    Recent Labs: 02/10/2015: Pro B Natriuretic peptide (BNP) 2826.0*; TSH 1.98 05/16/2015: ALT 22; BUN 18; Creatinine, Ser 1.46*; Hemoglobin 13.3; Platelets 130*; Potassium 3.5; Sodium 139    Lipid Panel No results found for: CHOL, TRIG, HDL, CHOLHDL, VLDL, LDLCALC, LDLDIRECT    Wt Readings from Last 3 Encounters:  05/30/15 182 lb 4 oz (82.668 kg)  05/16/15 177 lb 11.1 oz (80.6 kg)  04/14/15 180 lb 8 oz (81.874 kg)        ASSESSMENT AND PLAN:  1.  Coronary artery disease involving native coronary arteries with other forms of angina: The patient's exertional dyspnea is likely angina equivalent. I reviewed the angiogram with him. The area supplied by the left circumflex is relatively large. PCI on the left circumflex might improve his symptoms and his LV systolic function. I discussed with him risks and benefits and recommended proceeding with PCI of the proximal left circumflex. I started him on Plavix 75 mg once daily.  2. Chronic systolic heart  failure: Volume overload improved significantly with diuresis. He has been taking furosemide 20 mg twice daily. I changed this to 40 mg once daily. I also increased his lisinopril to 40 mg once daily. He does have mild chronic kidney disease. Continue small dose carvedilol. The dose cannot be increased due to bradycardia. I elected not to add digoxin due to chronic kidney disease.  3. Essential hypertension: Lisinopril was increased today.  4. Hyperlipidemia: Continue treatment with atorvastatin with a target LDL of less than 70.    Disposition:   FU with me in 1 month  Signed,  Christopher Sacramento, MD  05/30/2015 5:20 PM    [facility]

## 2015-06-09 NOTE — Progress Notes (Signed)
Pt refused to take all home meds cause he has taken all the meds for today

## 2015-06-09 NOTE — Interval H&P Note (Signed)
Cath Lab Visit (complete for each Cath Lab visit)  Clinical Evaluation Leading to the Procedure:   ACS: No.  Non-ACS:    Anginal Classification: CCS III  Anti-ischemic medical therapy: Minimal Therapy (1 class of medications)  Non-Invasive Test Results: High-risk stress test findings: cardiac mortality >3%/year (due to low EF).   Prior CABG: No previous CABG      History and Physical Interval Note:  06/09/2015 9:01 AM  [name].  has presented today for surgery, with the diagnosis of Coronary artery disease  The various methods of treatment have been discussed with the patient and family. After consideration of risks, benefits and other options for treatment, the patient has consented to  Procedure(s): Coronary Stent Intervention (N/A) as a surgical intervention .  The patient's history has been reviewed, patient examined, no change in status, stable for surgery.  I have reviewed the patient's chart and labs.  Questions were answered to the patient's satisfaction.     Kathlyn Sacramento

## 2015-06-09 NOTE — Progress Notes (Signed)
Pt AAOX3 in bed. Denies pain or discomfort . No bleeding noted from right wrist.

## 2015-06-10 DIAGNOSIS — I2510 Atherosclerotic heart disease of native coronary artery without angina pectoris: Secondary | ICD-10-CM | POA: Diagnosis not present

## 2015-06-10 DIAGNOSIS — I25118 Atherosclerotic heart disease of native coronary artery with other forms of angina pectoris: Secondary | ICD-10-CM | POA: Diagnosis not present

## 2015-06-10 LAB — CBC
HEMATOCRIT: 37.2 % — AB (ref 40.0–52.0)
HEMOGLOBIN: 13.2 g/dL (ref 13.0–18.0)
MCH: 33 pg (ref 26.0–34.0)
MCHC: 35.5 g/dL (ref 32.0–36.0)
MCV: 93 fL (ref 80.0–100.0)
Platelets: 134 10*3/uL — ABNORMAL LOW (ref 150–440)
RBC: 4 MIL/uL — ABNORMAL LOW (ref 4.40–5.90)
RDW: 15.4 % — AB (ref 11.5–14.5)
WBC: 9.4 10*3/uL (ref 3.8–10.6)

## 2015-06-10 LAB — BASIC METABOLIC PANEL
ANION GAP: 7 (ref 5–15)
BUN: 21 mg/dL — AB (ref 6–20)
CHLORIDE: 108 mmol/L (ref 101–111)
CO2: 25 mmol/L (ref 22–32)
Calcium: 8.7 mg/dL — ABNORMAL LOW (ref 8.9–10.3)
Creatinine, Ser: 1.37 mg/dL — ABNORMAL HIGH (ref 0.61–1.24)
GFR calc Af Amer: 60 mL/min — ABNORMAL LOW (ref >60)
GFR calc non Af Amer: 51 mL/min — ABNORMAL LOW (ref >60)
GLUCOSE: 202 mg/dL — AB (ref 65–99)
POTASSIUM: 3.5 mmol/L (ref 3.5–5.1)
Sodium: 140 mmol/L (ref 135–145)

## 2015-06-10 MED ORDER — POTASSIUM CHLORIDE ER 10 MEQ PO TBCR: 10.0000 meq | EXTENDED_RELEASE_TABLET | Freq: Every day | ORAL | Status: DC

## 2015-06-10 NOTE — Progress Notes (Signed)
Patient: [name]. / Admit Date: 06/09/2015 / Date of Encounter: 06/10/2015, 7:19 AM   Subjective: Status post staged diagnostic cath that dates back to 02/2015 with original diagnostic R&LHC showing mid to distal LAD 50%, distal LAD 40%, proximal LCx 80%, proximal RCA 40%, distal RCA 30%, 1st RPLB 30%. There was moderate to severe pulmonary HTN with severely elevated filling pressures. PA pressure was 70/24 with a mean of 40. Pulmonary wedge pressure was 30 mm Hg. There was severely reduced crdiac output at 3.27 with a cardiac index of 1.65. Given these findings he was diuresed initially with some improved in symptoms, though continued to note some significant exertional dyspnea. It was felt this may have been his anginal equivalent. Thus he underwent cardiac cath on 5/4 for intervention upon the LCx with PCI/DES. Status post PCI there was 0% residual stenosis. Remaining cath details are outlined below.   Overnight he has done quite well. No chest pain, dyspnea, palpitations, nausea, vomiting, or diaphoresis. He has ambulated in his room without issues. He has all of his medications at home at this time and does not need refills currently. He is ready to go home.   Review of Systems: Review of Systems  Constitutional: Negative for fever, chills, weight loss, malaise/fatigue and diaphoresis.  HENT: Negative for congestion.   Eyes: Negative for discharge and redness.  Respiratory: Negative for cough, hemoptysis, sputum production, shortness of breath and wheezing.   Cardiovascular: Negative for chest pain, palpitations, orthopnea, claudication, leg swelling and PND.  Gastrointestinal: Negative for heartburn, nausea, vomiting, abdominal pain, blood in stool and melena.  Genitourinary: Negative for hematuria.  Musculoskeletal: Negative for myalgias and falls.  Skin: Negative for rash.  Neurological: Negative for dizziness, tingling, tremors, sensory change, speech change, focal weakness,  loss of consciousness and weakness.  Endo/Heme/Allergies: Does not bruise/bleed easily.  Psychiatric/Behavioral: The patient is not nervous/anxious.   All other systems reviewed and are negative.    Objective: Telemetry: sinus bradycardia, 50's to 60's bpm, frequent PVC's occasional PAC's Physical Exam: Blood pressure 153/68, pulse 57, temperature 97.7 F (36.5 C), temperature source Oral, resp. rate 18, height 5\' 9"  (1.753 m), weight 187 lb (84.823 kg), SpO2 98 %. Body mass index is 27.6 kg/(m^2). General: Well developed, well nourished, in no acute distress. Head: Normocephalic, atraumatic, sclera non-icteric, no xanthomas, nares are without discharge. Neck: Negative for carotid bruits. JVP not elevated. Lungs: Clear bilaterally to auscultation without wheezes, rales, or rhonchi. Breathing is unlabored. Heart: RRR S1 S2 without murmurs, rubs, or gallops.  Abdomen: Soft, non-tender, non-distended with normoactive bowel sounds. No rebound/guarding. Extremities: No clubbing or cyanosis. No edema. Distal pedal pulses are 2+ and equal bilaterally. Cath site well healed without bleeding, bruising, erythema, swelling, or TTP. Distal pulse 2+. Neuro: Alert and oriented X 3. Moves all extremities spontaneously. Psych:  Responds to questions appropriately with a normal affect.   Intake/Output Summary (Last 24 hours) at 06/10/15 0719 Last data filed at 06/10/15 0541  Gross per 24 hour  Intake    240 ml  Output    950 ml  Net   -710 ml    Inpatient Medications:  . aspirin EC  81 mg Oral Daily  . atorvastatin  40 mg Oral Daily  . carvedilol  3.125 mg Oral BID  . clopidogrel  75 mg Oral Daily  . furosemide  40 mg Oral Daily  . gabapentin  100 mg Oral TID  . lisinopril  40 mg Oral Daily  .  sodium chloride flush  3 mL Intravenous Q12H  . sodium chloride flush  3 mL Intravenous Q12H   Infusions:    Labs:  Recent Labs  06/10/15 0404  NA 140  K 3.5  CL 108  CO2 25  GLUCOSE 202*    BUN 21*  CREATININE 1.37*  CALCIUM 8.7*   No results for input(s): AST, ALT, ALKPHOS, BILITOT, PROT, ALBUMIN in the last 72 hours.  Recent Labs  06/10/15 0404  WBC 9.4  HGB 13.2  HCT 37.2*  MCV 93.0  PLT 134*   No results for input(s): CKTOTAL, CKMB, TROPONINI in the last 72 hours. Invalid input(s): POCBNP No results for input(s): HGBA1C in the last 72 hours.   Weights: Filed Weights   06/09/15 0802  Weight: 187 lb (84.823 kg)   Cardiac cath 06/09/2015:  Conclusion     Prox RCA lesion, 40% stenosed.  Dist RCA lesion, 30% stenosed.  1st RPLB lesion, 40% stenosed.  Mid LAD to Dist LAD lesion, 50% stenosed.  Dist LAD lesion, 40% stenosed.  Prox Cx lesion, 80% stenosed. Post intervention, there is a 0% residual stenosis.  Successful angioplasty and DES placement to the proximal LCX.   Recommendations: Dual antiplatelet therapy for at least 6 months.      Radiology/Studies:  No results found.   Assessment and Plan   1. CAD s/p recent PCI to LCx as above: -Status post PCI to LCx -DAPT for at least the next 6 months with aspirin 81 mg and Plavix 75 mg daily -Coreg 3.125 mg bid (HR precludes further titration at this time) -Lipitor 40 mg  -Cath site instructions provided  2. Chronic systolic CHF/mixed ischemic and NICM: -Plan for outpatient echo in several months to evaluate for possible improved LV systolic function now that he is s/p PCI to the LCx -He does not appear to be volume overloaded -Coreg 3.125 mg bid as above -Lisinopril 40 mg -Lasix 40 mg daily  3. Hypertensive heart disease: -BP well controlled -Continue current regimen of Coreg (HR precludes titration), lisinopril, and Lasix  3. Valvular heart disease: -Outpatient follow up  4. ARI: -Hydrate -Needs follow up bmet first of next week   Signed, Christell Faith, PA-C Pager: [phone] 06/10/2015, 7:19 AM

## 2015-06-10 NOTE — Progress Notes (Signed)
A&O. Independent. Right wrist unremarkable. Pulse present.

## 2015-06-10 NOTE — Progress Notes (Signed)
Patient d/c'd home. Education provided, no questions at this time. Patient picked up by son. Telemetry removed. Eshan Trupiano R [place] 

## 2015-06-10 NOTE — Discharge Summary (Signed)
Discharge Summary    Patient ID: [name].,  MRN: [id number], DOB/AGE: 69-Apr-1947 69 y.o.  Admit date: 06/09/2015 Discharge date: 06/10/2015  Primary Care Provider: Tommi Rumps Primary Cardiologist: Dr. Fletcher Anon, MD  Discharge Diagnoses    Principal Problem:   Coronary artery disease involving native coronary artery of native heart without angina pectoris Active Problems:   Chronic systolic heart failure ([place])   Mixed Ischemic and Non-ischemic Cardiomyopathy   Hypertensive heart disease   Effort angina ([facility])   Allergies Allergies  Allergen Reactions  . Shellfish Allergy Other (See Comments)    Pt. instructed by MD to avoid seafood    Diagnostic Studies/Procedures    Cardiac cath 06/09/2015:  Conclusion     Prox RCA lesion, 40% stenosed.  Dist RCA lesion, 30% stenosed.  1st RPLB lesion, 40% stenosed.  Mid LAD to Dist LAD lesion, 50% stenosed.  Dist LAD lesion, 40% stenosed.  Prox Cx lesion, 80% stenosed. Post intervention, there is a 0% residual stenosis.  Successful angioplasty and DES placement to the proximal LCX.   Recommendations: Dual antiplatelet therapy for at least 6 months.   _____________   History of Present Illness     70 year old male with h/o CAD s/p recent PCI/DES to LCx by diagnostic cath 06/09/2015, mixed ischemic and nonischemic cardiomyoapthy, chronic combined CHF, hypertensive heart disease, colon cancer s/p colectomy in 2015 followed by chemotherapy with oxaliplatin and fluorouacil with neuropathy, moderate MR/TR, and anemia who underwent diagnostic cardiac cath on 06/09/15 following recent cardiac cath in January 2017 that showed 80% LCx stenosis coupled with severely elevated filling pressures with reduced cardiac output. He had planned PCI of the LCx at this time s/p diuresis.   He is not a smoker and has not drank alcohol in 10 years. The patient was seen in January 0000000 for acute systolic CHF with significant volume overload.  Echo at that time showed a severely reduced EF of 20-25%, moderate mitral regurgitation, moderate tricuspid regurgitation with moderate to severe pulmonary hypertension. He underwent right and left cardiac catheterization in January 2017 by Dr. Fletcher Anon, MD which showed moderate to severe pulmonary hypertension with severely elevated filling pressures with severely reduced cardiac output. Coronary angiography showed severe one-vessel CAD with 80-90% stenosis of the proximal LCx with mild diffuse disease affecting the LAD and RCA. His Lasix was subsequently increased and he was started on Coreg and lisinopril. He had gradual improvement in his symptoms, especially his leg edema and venous stasis. At his last follow up on 05/30/15 he was feeling significantly better, but continued to have significant exertional dyspnea without orthopnea or PND. It was felt this may be his anginal equivalent. The area supplied by his LCx was relatively large. He was scheduled for diagnostic cardiac cath on 5/4 with hopes PCI of the LCx may improve his LV systolic function. He was also started on Plavix 75 mg daily at that visit (4/24).    Hospital Course     Consultants: none  Cardiac cath on 5/4 showed left main with minor luminal irregs, mid to distal LAD 50% stenosis, distal LAD 40% stenosis, proximal LCx 80% stenosis s/p PCI/DES (Xience Alpine stent 3.0 x 12 mm) there was 0% residual stenosis, proximal RCA 40% stenosis, distal RCA 30%, 1st RPLB 40 % stenosis. It was recommended the patient continue DAPT for at least 6 months. Post cath he has done quite well. No chest pain, dyspnea, palpitations, nausea, vomiting, or diaphoresis. He has ambulated without issues. He  did not appear to be volume overloaded. Post-cath instructions were provided. Post-cath SCr 1.37, BUN 21, K+ 3.5, PLT 134. He was started on KCL 10 meq daily given his hypokalemia. He has all of his medications at home and does not need refills at this time. He is  ready to go home. He has been seen by Dr. Fletcher Anon, MD and felt to be stable for discharge.  _____________  Discharge Vitals Blood pressure 153/68, pulse 57, temperature 97.7 F (36.5 C), temperature source Oral, resp. rate 18, height 5\' 9"  (1.753 m), weight 187 lb (84.823 kg), SpO2 98 %.  Filed Weights   06/09/15 0802  Weight: 187 lb (84.823 kg)    Labs & Radiologic Studies    CBC  Recent Labs  06/10/15 0404  WBC 9.4  HGB 13.2  HCT 37.2*  MCV 93.0  PLT [id number]*   Basic Metabolic Panel  Recent Labs  06/10/15 0404  NA 140  K 3.5  CL 108  CO2 25  GLUCOSE 202*  BUN 21*  CREATININE 1.37*  CALCIUM 8.7*   Liver Function Tests No results for input(s): AST, ALT, ALKPHOS, BILITOT, PROT, ALBUMIN in the last 72 hours. No results for input(s): LIPASE, AMYLASE in the last 72 hours. Cardiac Enzymes No results for input(s): CKTOTAL, CKMB, CKMBINDEX, TROPONINI in the last 72 hours. BNP Invalid input(s): POCBNP D-Dimer No results for input(s): DDIMER in the last 72 hours. Hemoglobin A1C No results for input(s): HGBA1C in the last 72 hours. Fasting Lipid Panel No results for input(s): CHOL, HDL, LDLCALC, TRIG, CHOLHDL, LDLDIRECT in the last 72 hours. Thyroid Function Tests No results for input(s): TSH, T4TOTAL, T3FREE, THYROIDAB in the last 72 hours.  Invalid input(s): FREET3 _____________  No results found. Disposition   Pt is being discharged home today in good condition.  Follow-up Plans & Appointments     Discharge Instructions    AMB Referral to Cardiac Rehabilitation - Phase II    Complete by:  As directed   Diagnosis:   PTCA Stable Angina             Discharge Medications   Current Discharge Medication List    CONTINUE these medications which have NOT CHANGED   Details  aspirin EC 81 MG tablet Take 1 tablet (81 mg total) by mouth daily. Qty: 90 tablet, Refills: 3    atorvastatin (LIPITOR) 40 MG tablet Take 1 tablet (40 mg total) by mouth daily. Qty:  30 tablet, Refills: 3    carvedilol (COREG) 3.125 MG tablet Take 1 tablet (3.125 mg total) by mouth 2 (two) times daily. Qty: 60 tablet, Refills: 3    clopidogrel (PLAVIX) 75 MG tablet Take 1 tablet (75 mg total) by mouth daily. Qty: 30 tablet, Refills: 3    furosemide (LASIX) 40 MG tablet Take 1 tablet (40 mg total) by mouth daily. Qty: 30 tablet, Refills: 3    gabapentin (NEURONTIN) 100 MG capsule Take 2 capsules (200 mg total) by mouth 3 (three) times daily. Qty: 180 capsule, Refills: 0    lisinopril (PRINIVIL,ZESTRIL) 40 MG tablet Take 1 tablet (40 mg total) by mouth daily. Qty: 30 tablet, Refills: 3         Aspirin prescribed at discharge?  Yes High Intensity Statin Prescribed? (Lipitor 40-80mg  or Crestor 20-40mg ): Yes Beta Blocker Prescribed? Yes For EF <40%, was ACEI/ARB Prescribed? Yes ADP Receptor Inhibitor Prescribed? (i.e. Plavix etc.-Includes Medically Managed Patients): Yes For EF <40%, Aldosterone Inhibitor Prescribed? No: ARI at time of discharge, will  have recheck bmet at first of the following week, may add at that time Was EF assessed during THIS hospitalization? No: recent echo 02/18/2015 to assess EF Was Cardiac Rehab II ordered? (Included Medically managed Patients): Yes   Outstanding Labs/Studies   Needs follow up bmet first of the following week.  Duration of Discharge Encounter   Greater than 30 minutes including physician time.  Melvern Banker NP 06/10/2015, 7:45 AM

## 2015-06-21 ENCOUNTER — Other Ambulatory Visit: Payer: Self-pay | Admitting: Cardiovascular Disease

## 2015-06-22 ENCOUNTER — Ambulatory Visit (INDEPENDENT_AMBULATORY_CARE_PROVIDER_SITE_OTHER): Payer: Medicare HMO | Admitting: Physician Assistant

## 2015-06-22 ENCOUNTER — Encounter: Payer: Self-pay | Admitting: Physician Assistant

## 2015-06-22 VITALS — BP 130/62 | HR 62 | Ht 69.0 in | Wt 181.5 lb

## 2015-06-22 DIAGNOSIS — I10 Essential (primary) hypertension: Secondary | ICD-10-CM

## 2015-06-22 DIAGNOSIS — I2510 Atherosclerotic heart disease of native coronary artery without angina pectoris: Secondary | ICD-10-CM | POA: Diagnosis not present

## 2015-06-22 DIAGNOSIS — E785 Hyperlipidemia, unspecified: Secondary | ICD-10-CM

## 2015-06-22 DIAGNOSIS — I272 Other secondary pulmonary hypertension: Secondary | ICD-10-CM | POA: Diagnosis not present

## 2015-06-22 DIAGNOSIS — I5022 Chronic systolic (congestive) heart failure: Secondary | ICD-10-CM

## 2015-06-22 NOTE — Patient Instructions (Signed)
Medication Instructions:  Please continue your current medications  Labwork: BMET to check your kidney function  Testing/Procedures: None  Follow-Up: Your physician wants you to follow-up in: 6 months w/ Dr. Zannie Cove will receive a reminder letter in the mail two months in advance.  If you don't receive a letter, please call our office to schedule the follow-up appointment.  If you need a refill on your cardiac medications before your next appointment, please call your pharmacy.

## 2015-06-22 NOTE — Progress Notes (Signed)
Cardiology Office Note Date:  06/22/2015  Patient ID:  [name]., DOB Oct 16, 1945, MRN [id number] PCP:  Tommi Rumps, MD  Cardiologist:  Dr. Fletcher Anon, MD    Chief Complaint: Follow up s/p diagnostic cath with PCI/DES to LCx   History of Present Illness: [name]. is a 70 y.o. male with history of CAD s/p recent PCI/DES to LCx by diagnostic cath 06/09/2015, mixed ischemic and nonischemic cardiomyopathy, chronic combined CHF, hypertensive heart disease, colon cancer s/p colectomy in 2015 followed by chemotherapy with oxaliplatin and fluorouacil with neuropathy, moderate MR/TR, and anemia who underwent diagnostic cardiac cath on 06/09/15 following recent cardiac cath in January 2017 that showed 80% LCx stenosis coupled with severely elevated filling pressures with reduced cardiac output. He had planned PCI of the LCx on 06/09/15 s/p diuresis. He presents for hospital follow up of the above planned diagnostic cardiac catheterization.   He is not a smoker and has not drank alcohol in 10 years. The patient was seen in January 0000000 for acute systolic CHF with significant volume overload. Echo at that time showed a severely reduced EF of 20-25%, moderate mitral regurgitation, moderate tricuspid regurgitation with moderate to severe pulmonary hypertension. He underwent right and left cardiac catheterization in January 2017 by Dr. Fletcher Anon, MD which showed moderate to severe pulmonary hypertension with severely elevated filling pressures with severely reduced cardiac output. Coronary angiography showed severe one-vessel CAD with 80-90% stenosis of the proximal LCx with mild diffuse disease affecting the LAD and RCA. His Lasix was subsequently increased and he was started on Coreg and lisinopril. He had gradual improvement in his symptoms, especially his leg edema and venous stasis. At his last follow up on 05/30/15 he was feeling significantly better, but continued to have significant exertional dyspnea  without orthopnea or PND. It was felt this may be his anginal equivalent. The area supplied by his LCx was relatively large. He was scheduled for diagnostic cardiac cath on 5/4 with hopes PCI of the LCx may improve his LV systolic function. He was also started on Plavix 75 mg daily at that visit (4/24). Cardiac cath on 5/4 showed left main with minor luminal irregs, mid to distal LAD 50% stenosis, distal LAD 40% stenosis, proximal LCx 80% stenosis s/p PCI/DES (Xience Alpine stent 3.0 x 12 mm) there was 0% residual stenosis, proximal RCA 40% stenosis, distal RCA 30%, 1st RPLB 40 % stenosis.   Since his hospital discharge, he has felt "great." No chest pain or SOB. He is taking all of his medicatiosn daily without issues, including aspirin 81 mg daily and Plavix 75 mg daily. He is mowing his own lawn with a push mower and is asymptomatic. Weight ahs been stable. No LE edema, orthopnea, early satiety, or cough. Lasix is causing good urine output. He does not have any concerns today.     Past Medical History  Diagnosis Date  . Hypertensive heart disease   . Colon cancer ([place])     a. 2015 s/p colectomy followed by chemoRx with oxaliplatin & fluorouacil.  . Anemia   . Mixed Ischemic and Non-ischemic Cardiomyopathy     a. 02/2015 Echo: EF 20-25%, diff HK.  . Chronic systolic CHF (congestive heart failure) ([name])     a. 02/2015 Echo: EF 20-25%, diff HK, mod MR, mildy dil LA, mildly dil RV w/ mod RV syst dysfxn, mildly dil RA, mod TR, PASP 18mmHg.  . Moderate mitral regurgitation   . Moderate tricuspid regurgitation   .  CAD (coronary artery disease)     a. 02/2015 Cath: LM nl, LAD 50/40 mid/distal, LCX 80p, RCA 40p, 30d, RPL1 40, CO 3.27, CI 1.65; b. cath 06/09/15: LM minor irregs, m-dLAD 50%, dLAD 40%, pLCx 80% s/p PCI/DES 0%, pRCA 40%, dRCA 30%, 1st RPLB 40%    Past Surgical History  Procedure Laterality Date  . Portacath placement Left   . Colon surgery      Colectomy  . Port-a-cath removal N/A  07/12/2014    Procedure: REMOVAL PORT-A-CATH;  Surgeon: Molly Maduro, MD;  Location: [facility];  Service: General;  Laterality: N/A;  . Cardiac catheterization Bilateral 02/24/2015    Procedure: Right/Left Heart Cath and Coronary Angiography;  Surgeon: Wellington Hampshire, MD;  Location: [place] [facility];  Service: Cardiovascular;  Laterality: Bilateral;  . Cardiac catheterization N/A 06/09/2015    Procedure: Coronary Stent Intervention;  Surgeon: Wellington Hampshire, MD;  Location: [place] [facility];  Service: Cardiovascular;  Laterality: N/A;    Current Outpatient Prescriptions  Medication Sig Dispense Refill  . aspirin EC 81 MG tablet Take 1 tablet (81 mg total) by mouth daily. 90 tablet 3  . atorvastatin (LIPITOR) 40 MG tablet Take 1 tablet (40 mg total) by mouth daily. 30 tablet 3  . carvedilol (COREG) 3.125 MG tablet take 1 tablet by mouth twice a day 60 tablet 3  . clopidogrel (PLAVIX) 75 MG tablet Take 1 tablet (75 mg total) by mouth daily. 30 tablet 3  . furosemide (LASIX) 40 MG tablet Take 1 tablet (40 mg total) by mouth daily. 30 tablet 3  . gabapentin (NEURONTIN) 100 MG capsule Take 2 capsules (200 mg total) by mouth 3 (three) times daily. (Patient taking differently: Take 100 mg by mouth 3 (three) times daily. ) 180 capsule 0  . lisinopril (PRINIVIL,ZESTRIL) 40 MG tablet Take 1 tablet (40 mg total) by mouth daily. 30 tablet 3  . potassium chloride (K-DUR) 10 MEQ tablet Take 1 tablet (10 mEq total) by mouth daily. 30 tablet 2   No current facility-administered medications for this visit.    Allergies:   Shellfish allergy   Social History:  The patient  reports that he has never smoked. He has never used smokeless tobacco. He reports that he does not drink alcohol or use illicit drugs.   Family History:  The patient's Family history is unknown by patient. They did not talk about their health.   ROS:   Review of Systems  Constitutional: Negative for fever, chills, weight loss,  malaise/fatigue and diaphoresis.  HENT: Negative for congestion.   Eyes: Negative for discharge and redness.  Respiratory: Negative for cough, hemoptysis, sputum production, shortness of breath and wheezing.   Cardiovascular: Negative for chest pain, palpitations, orthopnea, claudication, leg swelling and PND.  Gastrointestinal: Negative for heartburn, nausea, vomiting, abdominal pain, blood in stool and melena.  Genitourinary: Negative for hematuria.  Musculoskeletal: Negative for myalgias, back pain and falls.  Skin: Negative for rash.  Neurological: Positive for sensory change. Negative for dizziness, tingling, tremors, speech change, focal weakness, loss of consciousness and weakness.       Long standing numbness along bilateral upper and lower extremities 2/2 chemo  Endo/Heme/Allergies: Does not bruise/bleed easily.  Psychiatric/Behavioral: Negative for substance abuse. The patient is not nervous/anxious.   All other systems reviewed and are negative.    PHYSICAL EXAM:  VS:  BP 130/62 mmHg  Pulse 62  Ht 5\' 9"  (1.753 m)  Wt 181 lb 8 oz (82.328 kg)  BMI 26.79 kg/m2 BMI: Body mass index is 26.79 kg/(m^2). Well nourished, well developed, in no acute distress HEENT: normocephalic, atraumatic Neck: no JVD, carotid bruits or masses Cardiac:  normal S1, S2; RRR; no murmurs, rubs, or gallops Lungs:  clear to auscultation bilaterally, no wheezing, rhonchi or rales Abd: soft, nontender, no hepatomegaly, + BS MS: no deformity or atrophy Ext: no edema, right radial cath site is well healed without bleeding, bruising , swelling, erythema, or TTP. Distal pulse 2+ at cath site as well as distal lower extremities Skin: warm and dry, no rash Neuro:  moves all extremities spontaneously, no focal abnormalities noted, follows commands Psych: euthymic mood, full affect   EKG:  Was ordered and interpreted by me today. Shows NSR, 62 bpm, incomplete RBBB, rare PVC, inferolateral TWI (old)  Recent  Labs: 02/10/2015: Pro B Natriuretic peptide (BNP) 2826.0*; TSH 1.98 05/16/2015: ALT 22 06/10/2015: BUN 21*; Creatinine, Ser 1.37*; Hemoglobin 13.2; Platelets 134*; Potassium 3.5; Sodium 140  No results found for requested labs within last 365 days.   Estimated Creatinine Clearance: 51.6 mL/min (by C-G formula based on Cr of 1.37).   Wt Readings from Last 3 Encounters:  06/22/15 181 lb 8 oz (82.328 kg)  06/09/15 187 lb (84.823 kg)  05/30/15 182 lb 4 oz (82.668 kg)     Other studies reviewed: Additional studies/records reviewed today include: summarized above  ASSESSMENT AND PLAN:  1. CAD in native artery s/p PCI as above to LCx: Doing well. No chest pain and much improved SOB. Continue DAPT with aspirin 81 mg and Plavix 75 daily for at least the next 6-12 months per interventional cardiology. Continue Coreg 3.125 mg bid as below. No plans for further ischemic work up at this time.   2. Chronic systolic CHF/pulmonary HTN: He does not appear to be volume overloaded at this time. SOB is much improved. Tolerating medications without issues. Check bmet. Continue Lasix 40 mg daily with KCL repletion at 10 meq daily, Coreg 3.125 mg bid (heart rate precludes further titration at this time), and lisinopril 40 mg daily. Heart healthy diet.   3. HTN: Well controlled. Continue Coreg 3.125 mg bid (heart rate precludes further titration at this time), lisinopril 40 mg daily, Lasix 40 mg daily. Low sodium diet.   4. HLD: Continue Lipitor 40 mg daily. Goal LDL <70. Plan for fasting lipid panel and LFT prior to next office visit.   5. History of colon cancer s/p colectomy and chemo as above with residual neuropathy along the bilateral upper and lower extremities: On neurontin per PCP. Unlikely to improve. Per PCP/oncology.   Disposition: F/u with Dr. Fletcher Anon, MD in 6 months  Current medicines are reviewed at length with the patient today.  The patient did not have any concerns regarding  medicines.  Melvern Banker PA-C 06/22/2015 3:31 PM     [address] [place], [place] [address] [phone]

## 2015-06-23 LAB — BASIC METABOLIC PANEL
BUN/Creatinine Ratio: 13 (ref 10–24)
BUN: 16 mg/dL (ref 8–27)
CO2: 25 mmol/L (ref 18–29)
CREATININE: 1.25 mg/dL (ref 0.76–1.27)
Calcium: 9.3 mg/dL (ref 8.6–10.2)
Chloride: 100 mmol/L (ref 96–106)
GFR, EST AFRICAN AMERICAN: 68 mL/min/{1.73_m2} (ref >59)
GFR, EST NON AFRICAN AMERICAN: 59 mL/min/{1.73_m2} — AB (ref >59)
Glucose: 201 mg/dL — ABNORMAL HIGH (ref 65–99)
POTASSIUM: 4 mmol/L (ref 3.5–5.2)
SODIUM: 142 mmol/L (ref 134–144)

## 2015-07-26 ENCOUNTER — Other Ambulatory Visit: Payer: Self-pay | Admitting: Family Medicine

## 2015-08-25 ENCOUNTER — Other Ambulatory Visit: Payer: Self-pay | Admitting: Nurse Practitioner

## 2015-09-10 NOTE — Progress Notes (Signed)
[facility] day:  09/12/15   Chief Complaint: Christopher Verma. is a 70 y.o. male with a history of stage IIIC colon cancer who is seen for 4 month assessment.  HPI: The patient was last seen in the medical oncology clinic on 05/16/2015.  At that time, he had a residual neuropathy secondary to oxaliplatin. He was on Neurontin.  Exam was unremarkable.  CBC revealed a hematocrit of 38.8, hemoglobin 13.3, and MCV 94.1.  CMP revealed a creatinine of 1.46.  CEA was 1.5 (normal).  During the interim, he has done well. He states that he is eating "like a horse". He has gained 13 pounds. He still has a neuropathy which has "cooled down little". He denies any concerns.  He sees Dr. Candace Cruise in 08/2016 for follow-up.   Past Medical History:  Diagnosis Date  . Anemia   . CAD (coronary artery disease)    a. 02/2015 Cath: LM nl, LAD 50/40 mid/distal, LCX 80p, RCA 40p, 30d, RPL1 40, CO 3.27, CI 1.65; b. cath 06/09/15: LM minor irregs, m-dLAD 50%, dLAD 40%, pLCx 80% s/p PCI/DES 0%, pRCA 40%, dRCA 30%, 1st RPLB 40%  . Chronic systolic CHF (congestive heart failure) ([place])    a. 02/2015 Echo: EF 20-25%, diff HK, mod MR, mildy dil LA, mildly dil RV w/ mod RV syst dysfxn, mildly dil RA, mod TR, PASP 15mmHg.  . Colon cancer ([place])    a. 2015 s/p colectomy followed by chemoRx with oxaliplatin & fluorouacil.  Marland Kitchen Hypertensive heart disease   . Mixed Ischemic and Non-ischemic Cardiomyopathy    a. 02/2015 Echo: EF 20-25%, diff HK.  . Moderate mitral regurgitation   . Moderate tricuspid regurgitation     Past Surgical History:  Procedure Laterality Date  . CARDIAC CATHETERIZATION Bilateral 02/24/2015   Procedure: Right/Left Heart Cath and Coronary Angiography;  Surgeon: Wellington Hampshire, MD;  Location: [place] [facility];  Service: Cardiovascular;  Laterality: Bilateral;  . CARDIAC CATHETERIZATION N/A 06/09/2015   Procedure: Coronary Stent Intervention;  Surgeon: Wellington Hampshire,  MD;  Location: [place] [facility];  Service: Cardiovascular;  Laterality: N/A;  . COLON SURGERY     Colectomy  . PORT-A-CATH REMOVAL N/A 07/12/2014   Procedure: REMOVAL PORT-A-CATH;  Surgeon: Molly Maduro, MD;  Location: [facility];  Service: General;  Laterality: N/A;  . PORTACATH PLACEMENT Left     Family History  Problem Relation Age of Onset  . Family history unknown: Yes    Social History:  reports that he has never smoked. He has never used smokeless tobacco. He reports that he does not drink alcohol or use drugs.  He has retired from the city.  The patient is accompanied by his wife, [name],  today.  Allergies:  Allergies  Allergen Reactions  . Shellfish Allergy Other (See Comments)    Pt. instructed by MD to avoid seafood    Current Medications: Current Outpatient Prescriptions  Medication Sig Dispense Refill  . aspirin EC 81 MG tablet Take 1 tablet (81 mg total) by mouth daily. 90 tablet 3  . atorvastatin (LIPITOR) 40 MG tablet take 1 tablet by mouth once daily 90 tablet 2  . carvedilol (COREG) 3.125 MG tablet take 1 tablet by mouth twice a day 60 tablet 3  . clopidogrel (PLAVIX) 75 MG tablet Take 1 tablet (75 mg total) by mouth daily. 30 tablet 3  . furosemide (LASIX) 40 MG tablet Take 1 tablet (40 mg total)  by mouth daily. 30 tablet 3  . gabapentin (NEURONTIN) 100 MG capsule Take 2 capsules (200 mg total) by mouth 3 (three) times daily. (Patient taking differently: Take 100 mg by mouth 3 (three) times daily. ) 180 capsule 0  . lisinopril (PRINIVIL,ZESTRIL) 40 MG tablet Take 1 tablet (40 mg total) by mouth daily. 30 tablet 3  . potassium chloride (K-DUR) 10 MEQ tablet Take 1 tablet (10 mEq total) by mouth daily. 30 tablet 2   No current facility-administered medications for this visit.     Review of Systems:  GENERAL:  Feels good. No fevers or sweats.  Weight up 13 pounds. PERFORMANCE STATUS (ECOG):  0 HEENT:  No visual changes, runny nose, sore throat, mouth sores  or tenderness. Lungs: No shortness of breath or cough.  No hemoptysis. Cardiac:  No chest pain, palpitations, orthopnea, or PND. GI:  Eating "like a horse".  No nausea, vomiting, diarrhea, constipation, melena or hematochezia. GU:  No urgency, frequency, dysuria, or hematuria. Musculoskeletal:  No back pain.  No joint pain.  No muscle tenderness. Extremities:  No pain or swelling. Skin:  No rashes or skin changes. Neuro:  Neuropathy in fingers and toes, improved.  No headache, numbness or weakness, balance or coordination issues. Endocrine:  No diabetes, thyroid issues, hot flashes or night sweats. Psych:  No mood changes, depression or anxiety. Pain:  No focal pain. Review of systems:  All other systems reviewed and found to be negative.  Physical Exam: Blood pressure (!) 148/74, pulse (!) 56, temperature 97.8 F (36.6 C), temperature source Tympanic, resp. rate 18, weight 190 lb 11.2 oz (86.5 kg). GENERAL:  Well developed, well nourished, gentleman sitting comfortably in the exam room in no acute distress. MENTAL STATUS:  Alert and oriented to person, place and time. HEAD:  Near alopecia.  Normocephalic, atraumatic, face symmetric, no Cushingoid features. EYES:  Brown eyes.  Pupils equal round and reactive to light and accomodation.  No conjunctivitis or scleral icterus. ENT:  Oropharynx clear without lesion.  Poor dentition.  Tongue normal. Mucous membranes moist.  RESPIRATORY:  Clear to auscultation without rales, wheezes or rhonchi. CARDIOVASCULAR:  Regular rate and rhythm without murmur, rub or gallop. No JVD. ABDOMEN:  Soft, non-tender, with active bowel sounds, and no hepatosplenomegaly.  No masses. SKIN:  No rashes, ulcers or lesions. EXTREMITIES:  No edema, no skin discoloration or tenderness.  No palpable cords. LYMPH NODES: No palpable cervical, supraclavicular, axillary or inguinal adenopathy  NEUROLOGICAL: Unremarkable. PSYCH:  Appropriate.   Appointment on 09/12/2015   Component Date Value Ref Range Status  . WBC 09/12/2015 3.8  3.8 - 10.6 K/uL Final  . RBC 09/12/2015 4.14* 4.40 - 5.90 MIL/uL Final  . Hemoglobin 09/12/2015 13.6  13.0 - 18.0 g/dL Final  . HCT 09/12/2015 38.6* 40.0 - 52.0 % Final  . MCV 09/12/2015 93.2  80.0 - 100.0 fL Final  . MCH 09/12/2015 32.8  26.0 - 34.0 pg Final  . MCHC 09/12/2015 35.2  32.0 - 36.0 g/dL Final  . RDW 09/12/2015 14.6* 11.5 - 14.5 % Final  . Platelets 09/12/2015 155  150 - 440 K/uL Final  . Neutrophils Relative % 09/12/2015 53  % Final  . Neutro Abs 09/12/2015 2.0  1.4 - 6.5 K/uL Final  . Lymphocytes Relative 09/12/2015 25  % Final  . Lymphs Abs 09/12/2015 0.9* 1.0 - 3.6 K/uL Final  . Monocytes Relative 09/12/2015 16  % Final  . Monocytes Absolute 09/12/2015 0.6  0.2 - 1.0 K/uL  Final  . Eosinophils Relative 09/12/2015 5  % Final  . Eosinophils Absolute 09/12/2015 0.2  0 - 0.7 K/uL Final  . Basophils Relative 09/12/2015 1  % Final  . Basophils Absolute 09/12/2015 0.0  0 - 0.1 K/uL Final    Assessment:  Christopher Neyer. is a 70 y.o. male with stage IIIC colon cancer. He presented with symptomatic anemia.  He underwent descending and proximal sigmoid colectomy, left ureteral lysis, and partial cecectomy on 06/23/2013. Six of 22 lymph nodes were positive.  There were tumor deposits (discontinuous extramural extension).  There was lymphovascular and perineural invasion. Pathologic stage was IIIC (T4bN2a M0).  He received FOLFOX chemotherapy from 07/22/2013 until 01/06/2014.   Chest, abdomen, and pelvic CT scan on 01/11/2014 revealed no evidence of metastatic disease. He was noted to have stable splenomegaly. There was some progression of tree-in-bud changes in the lungs, questionable MAC infection. There were chronic inflammatory changes.  Chest, abdomen, and pelvic CT scan on 01/12/2015 revealed no evidence of metastatic disease in the chest, abdomen or pelvis.  There was a solitary 4 mm right upper lobe pulmonary  nodule (stable). There was mild cardiomegaly (increased).  There was mild patchy ground-glass opacity and interlobular septal thickening throughout both lungs, suggestive of pulmonary edema, concerning for developing congestive heart failure.  There was stable dilated main pulmonary artery, suggesting pulmonary arterial hypertension.  There was left main and 3 vessel coronary atherosclerosis.    CEA was 4.3 on 06/20/2013, 1.8 on 01/06/2014, 1.5 on 09/16/2014, 1.1 on 01/14/2015, 1.5 on 05/16/2015, and 1.6 on 09/12/2015.  Colonoscopy on 04/12/2014 revealed several polyps.  There was no dysplasia or malignancy. Lower extremity duplex on 06/15/2014 revealed no evidence of DVT.  He has a mild normocytic anemia.  Diet is good.  He denies any melena or hematochezia.  Echocardiogram revealed an EF was 20-25% on 02/18/2015.  Cardiac catheterization on 02/24/2015 revealed 80% occlusion of the proximal left circumflex.  He is being managed medically.  Symptomatically, he feels good.  He is eating well and gaining weight.  He has a residual neuropathy secondary to oxaliplatin. He is on Neurontin.  Exam is unremarkable.  Plan: 1.  Labs today: CBC with diff, CMP, CEA.  2.  Add:  Ferritin, iron studies. 3.  Continue Neurontin. 4.  Schedule chest, abdomen, pelvic CT scan 01/12/2016 [id number]. 5.  RTC in 4 months for MD assessment, labs (CBC with diff, CMP, CEA), and review of CT scan.   Lequita Asal, MD  09/12/15, 3:31 PM

## 2015-09-12 ENCOUNTER — Inpatient Hospital Stay (HOSPITAL_BASED_OUTPATIENT_CLINIC_OR_DEPARTMENT_OTHER): Payer: Medicare HMO

## 2015-09-12 ENCOUNTER — Inpatient Hospital Stay: Payer: Medicare HMO | Attending: Hematology and Oncology | Admitting: Hematology and Oncology

## 2015-09-12 ENCOUNTER — Other Ambulatory Visit: Payer: Self-pay | Admitting: *Deleted

## 2015-09-12 VITALS — BP 148/74 | HR 56 | Temp 97.8°F | Resp 18 | Wt 190.7 lb

## 2015-09-12 DIAGNOSIS — G629 Polyneuropathy, unspecified: Secondary | ICD-10-CM | POA: Insufficient documentation

## 2015-09-12 DIAGNOSIS — Z79899 Other long term (current) drug therapy: Secondary | ICD-10-CM | POA: Diagnosis not present

## 2015-09-12 DIAGNOSIS — Z7982 Long term (current) use of aspirin: Secondary | ICD-10-CM | POA: Insufficient documentation

## 2015-09-12 DIAGNOSIS — I110 Hypertensive heart disease with heart failure: Secondary | ICD-10-CM | POA: Diagnosis not present

## 2015-09-12 DIAGNOSIS — I081 Rheumatic disorders of both mitral and tricuspid valves: Secondary | ICD-10-CM | POA: Insufficient documentation

## 2015-09-12 DIAGNOSIS — Z9221 Personal history of antineoplastic chemotherapy: Secondary | ICD-10-CM | POA: Diagnosis not present

## 2015-09-12 DIAGNOSIS — R161 Splenomegaly, not elsewhere classified: Secondary | ICD-10-CM | POA: Diagnosis not present

## 2015-09-12 DIAGNOSIS — I2510 Atherosclerotic heart disease of native coronary artery without angina pectoris: Secondary | ICD-10-CM | POA: Insufficient documentation

## 2015-09-12 DIAGNOSIS — R918 Other nonspecific abnormal finding of lung field: Secondary | ICD-10-CM | POA: Diagnosis not present

## 2015-09-12 DIAGNOSIS — C187 Malignant neoplasm of sigmoid colon: Secondary | ICD-10-CM

## 2015-09-12 DIAGNOSIS — D649 Anemia, unspecified: Secondary | ICD-10-CM | POA: Diagnosis not present

## 2015-09-12 DIAGNOSIS — C189 Malignant neoplasm of colon, unspecified: Secondary | ICD-10-CM | POA: Insufficient documentation

## 2015-09-12 DIAGNOSIS — I5022 Chronic systolic (congestive) heart failure: Secondary | ICD-10-CM | POA: Insufficient documentation

## 2015-09-12 DIAGNOSIS — I428 Other cardiomyopathies: Secondary | ICD-10-CM | POA: Insufficient documentation

## 2015-09-12 LAB — COMPREHENSIVE METABOLIC PANEL
ALT: 16 U/L — ABNORMAL LOW (ref 17–63)
AST: 20 U/L (ref 15–41)
Albumin: 4.3 g/dL (ref 3.5–5.0)
Alkaline Phosphatase: 111 U/L (ref 38–126)
Anion gap: 6 (ref 5–15)
BUN: 15 mg/dL (ref 6–20)
CO2: 28 mmol/L (ref 22–32)
Calcium: 9.1 mg/dL (ref 8.9–10.3)
Chloride: 104 mmol/L (ref 101–111)
Creatinine, Ser: 1.34 mg/dL — ABNORMAL HIGH (ref 0.61–1.24)
GFR calc Af Amer: >60 mL/min (ref >60)
GFR calc non Af Amer: 53 mL/min — ABNORMAL LOW (ref >60)
Glucose, Bld: 145 mg/dL — ABNORMAL HIGH (ref 65–99)
Potassium: 3.8 mmol/L (ref 3.5–5.1)
Sodium: 138 mmol/L (ref 135–145)
Total Bilirubin: 0.9 mg/dL (ref 0.3–1.2)
Total Protein: 7.6 g/dL (ref 6.5–8.1)

## 2015-09-12 LAB — CBC WITH DIFFERENTIAL/PLATELET
Basophils Absolute: 0 10*3/uL (ref 0–0.1)
Basophils Relative: 1 %
Eosinophils Absolute: 0.2 10*3/uL (ref 0–0.7)
Eosinophils Relative: 5 %
HCT: 38.6 % — ABNORMAL LOW (ref 40.0–52.0)
Hemoglobin: 13.6 g/dL (ref 13.0–18.0)
Lymphocytes Relative: 25 %
Lymphs Abs: 0.9 10*3/uL — ABNORMAL LOW (ref 1.0–3.6)
MCH: 32.8 pg (ref 26.0–34.0)
MCHC: 35.2 g/dL (ref 32.0–36.0)
MCV: 93.2 fL (ref 80.0–100.0)
Monocytes Absolute: 0.6 10*3/uL (ref 0.2–1.0)
Monocytes Relative: 16 %
Neutro Abs: 2 10*3/uL (ref 1.4–6.5)
Neutrophils Relative %: 53 %
Platelets: 155 10*3/uL (ref 150–440)
RBC: 4.14 MIL/uL — ABNORMAL LOW (ref 4.40–5.90)
RDW: 14.6 % — ABNORMAL HIGH (ref 11.5–14.5)
WBC: 3.8 10*3/uL (ref 3.8–10.6)

## 2015-09-12 LAB — IRON AND TIBC
Iron: 100 ug/dL (ref 45–182)
Saturation Ratios: 33 % (ref 17.9–39.5)
TIBC: 303 ug/dL (ref 250–450)
UIBC: 203 ug/dL

## 2015-09-12 LAB — FERRITIN: Ferritin: 44 ng/mL (ref 24–336)

## 2015-09-12 NOTE — Progress Notes (Signed)
Patient is here for follow up, no complaints  

## 2015-09-13 LAB — CEA: CEA: 1.6 ng/mL (ref 0.0–4.7)

## 2015-09-24 ENCOUNTER — Other Ambulatory Visit: Payer: Self-pay | Admitting: Physician Assistant

## 2015-10-25 ENCOUNTER — Other Ambulatory Visit: Payer: Self-pay | Admitting: Cardiovascular Disease

## 2015-11-06 ENCOUNTER — Encounter: Payer: Self-pay | Admitting: Hematology and Oncology

## 2015-12-07 ENCOUNTER — Other Ambulatory Visit: Payer: Self-pay | Admitting: Cardiovascular Disease

## 2015-12-19 ENCOUNTER — Encounter: Payer: Self-pay | Admitting: Cardiovascular Disease

## 2015-12-19 ENCOUNTER — Ambulatory Visit (INDEPENDENT_AMBULATORY_CARE_PROVIDER_SITE_OTHER): Payer: Medicare HMO | Admitting: Cardiovascular Disease

## 2015-12-19 VITALS — BP 110/70 | HR 55 | Ht 69.0 in | Wt 192.5 lb

## 2015-12-19 DIAGNOSIS — I10 Essential (primary) hypertension: Secondary | ICD-10-CM

## 2015-12-19 DIAGNOSIS — E784 Other hyperlipidemia: Secondary | ICD-10-CM

## 2015-12-19 DIAGNOSIS — E7849 Other hyperlipidemia: Secondary | ICD-10-CM

## 2015-12-19 DIAGNOSIS — I5022 Chronic systolic (congestive) heart failure: Secondary | ICD-10-CM

## 2015-12-19 DIAGNOSIS — I2510 Atherosclerotic heart disease of native coronary artery without angina pectoris: Secondary | ICD-10-CM

## 2015-12-19 MED ORDER — SPIRONOLACTONE 25 MG PO TABS: 25.0000 mg | ORAL_TABLET | Freq: Every day | ORAL | 3 refills | Status: DC

## 2015-12-19 NOTE — Patient Instructions (Addendum)
Medication Instructions:  Your physician has recommended you make the following change in your medication:  STOP taking potassium START taking spironolactone 25mg  once daily   Labwork: BMET in one week  Testing/Procedures: Your physician has requested that you have an echocardiogram. Echocardiography is a painless test that uses sound waves to create images of your heart. It provides your doctor with information about the size and shape of your heart and how well your heart's chambers and valves are working. This procedure takes approximately one hour. There are no restrictions for this procedure.    Follow-Up: Your physician wants you to follow-up in: 6 months with Dr. Fletcher Anon.  You will receive a reminder letter in the mail two months in advance. If you don't receive a letter, please call our office to schedule the follow-up appointment.   Any Other Special Instructions Will Be Listed Below (If Applicable).     If you need a refill on your cardiac medications before your next appointment, please call your pharmacy.  Echocardiogram An echocardiogram, or echocardiography, uses sound waves (ultrasound) to produce an image of your heart. The echocardiogram is simple, painless, obtained within a short period of time, and offers valuable information to your health care provider. The images from an echocardiogram can provide information such as:  Evidence of coronary artery disease (CAD).  Heart size.  Heart muscle function.  Heart valve function.  Aneurysm detection.  Evidence of a past heart attack.  Fluid buildup around the heart.  Heart muscle thickening.  Assess heart valve function. LET [facility] CARE PROVIDER KNOW ABOUT:  Any allergies you have.  All medicines you are taking, including vitamins, herbs, eye drops, creams, and over-the-counter medicines.  Previous problems you or members of your family have had with the use of anesthetics.  Any blood disorders you  have.  Previous surgeries you have had.  Medical conditions you have.  Possibility of pregnancy, if this applies. BEFORE THE PROCEDURE  No special preparation is needed. Eat and drink normally.  PROCEDURE   In order to produce an image of your heart, gel will be applied to your chest and a wand-like tool (transducer) will be moved over your chest. The gel will help transmit the sound waves from the transducer. The sound waves will harmlessly bounce off your heart to allow the heart images to be captured in real-time motion. These images will then be recorded.  You may need an IV to receive a medicine that improves the quality of the pictures. AFTER THE PROCEDURE You may return to your normal schedule including diet, activities, and medicines, unless your health care provider tells you otherwise.   This information is not intended to replace advice given to you by your health care provider. Make sure you discuss any questions you have with your health care provider.   Document Released: 01/20/2000 Document Revised: 02/12/2014 Document Reviewed: 09/29/2012 Elsevier Interactive Patient Education [employer].

## 2015-12-19 NOTE — Progress Notes (Signed)
Cardiology Office Note   Date:  12/19/2015   ID:  Christopher Oh., DOB April 05, 1945, MRN [id number]  PCP:  Christopher Rumps, MD  Cardiologist:   Kathlyn Sacramento, MD   Chief Complaint  Patient presents with  . other    6 month follow up. Meds reviewed by the pt. verbally. "doing well."       History of Present Illness: [name]. is a 70 y.o. male who presents for a follow-up visit regarding chronic systolic heart failure and coronary artery disease. He has known history of colon cancer status post colectomy in 2015 followed by chemotherapy with oxaliplatin and fluorouracil. He had neuropathy as a result.  He is not a smoker and has not had any alcohol in 10 years. He is not diabetic.  He was seen in January for acute systolic heart failure with significant volume overload. Echocardiogram showed severely reduced LV systolic function with an ejection fraction of 20-25%, moderate mitral regurgitation, moderate tricuspid regurgitation with moderate to severe pulmonary hypertension. Right and left cardiac catheterization showed moderate to severe pulmonary hypertension with severely elevated filling pressures with severely reduced cardiac output. Coronary angiography showed severe one-vessel coronary artery disease with 80-90% stenosis in the proximal left circumflex with mild diffuse disease affecting the LAD and RCA. He was started on heart failure medications and subsequently underwent staged PCI of the left circumflex in May 2017.  He has been doing extremely well since then but no chest pain or shortness of breath. No orthopnea, PND or leg edema. He reports very good appetite and he has gained some weight. He is taking all his medications regularly.  Past Medical History:  Diagnosis Date  . Anemia   . CAD (coronary artery disease)    a. 02/2015 Cath: LM nl, LAD 50/40 mid/distal, LCX 80p, RCA 40p, 30d, RPL1 40, CO 3.27, CI 1.65; b. cath 06/09/15: LM minor irregs, m-dLAD 50%, dLAD  40%, pLCx 80% s/p PCI/DES 0%, pRCA 40%, dRCA 30%, 1st RPLB 40%  . Chronic systolic CHF (congestive heart failure) ([place])    a. 02/2015 Echo: EF 20-25%, diff HK, mod MR, mildy dil LA, mildly dil RV w/ mod RV syst dysfxn, mildly dil RA, mod TR, PASP 87mmHg.  . Colon cancer ([place])    a. 2015 s/p colectomy followed by chemoRx with oxaliplatin & fluorouacil.  Marland Kitchen Hypertensive heart disease   . Mixed Ischemic and Non-ischemic Cardiomyopathy    a. 02/2015 Echo: EF 20-25%, diff HK.  . Moderate mitral regurgitation   . Moderate tricuspid regurgitation     Past Surgical History:  Procedure Laterality Date  . CARDIAC CATHETERIZATION Bilateral 02/24/2015   Procedure: Right/Left Heart Cath and Coronary Angiography;  Surgeon: Wellington Hampshire, MD;  Location: [facility];  Service: Cardiovascular;  Laterality: Bilateral;  . CARDIAC CATHETERIZATION N/A 06/09/2015   Procedure: Coronary Stent Intervention;  Surgeon: Wellington Hampshire, MD;  Location: [facility];  Service: Cardiovascular;  Laterality: N/A;  . COLON SURGERY     Colectomy  . PORT-A-CATH REMOVAL N/A 07/12/2014   Procedure: REMOVAL PORT-A-CATH;  Surgeon: Molly Maduro, MD;  Location: [facility];  Service: General;  Laterality: N/A;  . PORTACATH PLACEMENT Left      Current Outpatient Prescriptions  Medication Sig Dispense Refill  . aspirin EC 81 MG tablet Take 1 tablet (81 mg total) by mouth daily. 90 tablet 3  . atorvastatin (LIPITOR) 40 MG tablet take 1 tablet by mouth once daily 90  tablet 2  . carvedilol (COREG) 3.125 MG tablet take 1 tablet by mouth twice a day 60 tablet 3  . clopidogrel (PLAVIX) 75 MG tablet Take 1 tablet (75 mg total) by mouth daily. 30 tablet 3  . furosemide (LASIX) 40 MG tablet Take 1 tablet (40 mg total) by mouth daily. 60 tablet 3  . gabapentin (NEURONTIN) 100 MG capsule Take 2 capsules (200 mg total) by mouth 3 (three) times daily. (Patient taking differently: Take 100 mg by mouth 3 (three) times daily. )  180 capsule 0  . lisinopril (PRINIVIL,ZESTRIL) 40 MG tablet take 1 tablet by mouth once daily 30 tablet 3  . potassium chloride (K-DUR) 10 MEQ tablet take 1 tablet by mouth once daily 30 tablet 2   No current facility-administered medications for this visit.     Allergies:   Shellfish allergy    Social History:  The patient  reports that he has never smoked. He has never used smokeless tobacco. He reports that he does not drink alcohol or use drugs.   Family History:  The patient's Family history is unknown by patient.    ROS:  Please see the history of present illness.   Otherwise, review of systems are positive for none.   All other systems are reviewed and negative.    PHYSICAL EXAM: VS:  BP 110/70 (BP Location: Left Arm, Patient Position: Sitting, Cuff Size: Normal)   Pulse (!) 55   Ht 5\' 9"  (1.753 m)   Wt 192 lb 8 oz (87.3 kg)   BMI 28.43 kg/m  , BMI Body mass index is 28.43 kg/m. GEN: Well nourished, well developed, in no acute distress HEENT: normal Neck: no JVD, carotid bruits, or masses Cardiac: RRR; no murmurs, rubs, or gallops,no edema  Respiratory:  clear to auscultation bilaterally, normal work of breathing GI: soft, nontender, nondistended, + BS MS: no deformity or atrophy Skin: warm and dry, no rash Neuro:  Strength and sensation are intact Psych: euthymic mood, full affect   EKG:  EKG is ordered today. EKG showed sinus bradycardia with a PVC. T wave changes in the inferior and anterolateral leads.    Recent Labs: 02/10/2015: Pro B Natriuretic peptide (BNP) 2,826.0; TSH 1.98 09/12/2015: ALT 16; BUN 15; Creatinine, Ser 1.34; Hemoglobin 13.6; Platelets 155; Potassium 3.8; Sodium 138    Lipid Panel No results found for: CHOL, TRIG, HDL, CHOLHDL, VLDL, LDLCALC, LDLDIRECT    Wt Readings from Last 3 Encounters:  12/19/15 192 lb 8 oz (87.3 kg)  09/12/15 190 lb 11.2 oz (86.5 kg)  06/22/15 181 lb 8 oz (82.3 kg)        ASSESSMENT AND PLAN:  1.  Coronary  artery disease involving native coronary arteries with other forms of angina:  He is status post PCI and drug-eluting stent placement to the left circumflex with resolution of all anginal symptoms. Continue medical therapy.  2. Chronic systolic heart failure:  He appears to be euvolemic on current dose of furosemide. He is currently New York Heart Association class II. I'm going to obtain an echocardiogram to reevaluate his ejection fraction. I also elected to start him on spironolactone 25 mg once daily. I discontinued his potassium supplement. Check basic metabolic profile in one week.  3. Essential hypertension: Blood pressure is well controlled on current medications.  4. Hyperlipidemia: Continue treatment with atorvastatin with a target LDL of less than 70.    Disposition:   FU with me in 6 months  Signed,  Kathlyn Sacramento,  MD  12/19/2015 2:18 PM    [facility]

## 2015-12-26 ENCOUNTER — Other Ambulatory Visit (INDEPENDENT_AMBULATORY_CARE_PROVIDER_SITE_OTHER): Payer: Medicare HMO

## 2015-12-26 DIAGNOSIS — I5022 Chronic systolic (congestive) heart failure: Secondary | ICD-10-CM | POA: Diagnosis not present

## 2015-12-27 LAB — BASIC METABOLIC PANEL
BUN / CREAT RATIO: 14 (ref 10–24)
BUN: 18 mg/dL (ref 8–27)
CO2: 24 mmol/L (ref 18–29)
CREATININE: 1.29 mg/dL — AB (ref 0.76–1.27)
Calcium: 8.7 mg/dL (ref 8.6–10.2)
Chloride: 106 mmol/L (ref 96–106)
GFR calc non Af Amer: 56 mL/min/{1.73_m2} — ABNORMAL LOW (ref >59)
GFR, EST AFRICAN AMERICAN: 65 mL/min/{1.73_m2} (ref >59)
Glucose: 155 mg/dL — ABNORMAL HIGH (ref 65–99)
Potassium: 3.9 mmol/L (ref 3.5–5.2)
SODIUM: 145 mmol/L — AB (ref 134–144)

## 2016-01-10 ENCOUNTER — Ambulatory Visit (INDEPENDENT_AMBULATORY_CARE_PROVIDER_SITE_OTHER): Payer: Medicare HMO

## 2016-01-10 ENCOUNTER — Other Ambulatory Visit: Payer: Self-pay

## 2016-01-10 DIAGNOSIS — I5022 Chronic systolic (congestive) heart failure: Secondary | ICD-10-CM

## 2016-01-12 ENCOUNTER — Ambulatory Visit
Admission: RE | Admit: 2016-01-12 | Discharge: 2016-01-12 | Disposition: A | Discharge status: Home / Self Care | Payer: Medicare HMO | Source: Ambulatory Visit | Attending: Hematology and Oncology | Admitting: Hematology and Oncology

## 2016-01-12 DIAGNOSIS — K8020 Calculus of gallbladder without cholecystitis without obstruction: Secondary | ICD-10-CM | POA: Insufficient documentation

## 2016-01-12 DIAGNOSIS — I2510 Atherosclerotic heart disease of native coronary artery without angina pectoris: Secondary | ICD-10-CM | POA: Insufficient documentation

## 2016-01-12 DIAGNOSIS — C187 Malignant neoplasm of sigmoid colon: Secondary | ICD-10-CM | POA: Insufficient documentation

## 2016-01-12 MED ORDER — IOPAMIDOL (ISOVUE-300) INJECTION 61%
100.0000 mL | Freq: Once | INTRAVENOUS | Status: AC | PRN
  Administered 2016-01-12: 100 mL via INTRAVENOUS

## 2016-01-19 ENCOUNTER — Ambulatory Visit: Payer: Medicare HMO | Admitting: Hematology and Oncology

## 2016-01-19 ENCOUNTER — Other Ambulatory Visit: Payer: Medicare HMO

## 2016-01-20 ENCOUNTER — Inpatient Hospital Stay: Payer: Medicare HMO | Attending: Hematology and Oncology

## 2016-01-20 ENCOUNTER — Encounter: Payer: Self-pay | Admitting: Hematology and Oncology

## 2016-01-20 ENCOUNTER — Inpatient Hospital Stay (HOSPITAL_BASED_OUTPATIENT_CLINIC_OR_DEPARTMENT_OTHER): Payer: Medicare HMO | Admitting: Hematology and Oncology

## 2016-01-20 VITALS — BP 126/73 | HR 65 | Temp 95.8°F | Resp 18 | Wt 193.8 lb

## 2016-01-20 DIAGNOSIS — R161 Splenomegaly, not elsewhere classified: Secondary | ICD-10-CM | POA: Diagnosis not present

## 2016-01-20 DIAGNOSIS — I429 Cardiomyopathy, unspecified: Secondary | ICD-10-CM | POA: Insufficient documentation

## 2016-01-20 DIAGNOSIS — Z7982 Long term (current) use of aspirin: Secondary | ICD-10-CM

## 2016-01-20 DIAGNOSIS — Z85038 Personal history of other malignant neoplasm of large intestine: Secondary | ICD-10-CM | POA: Insufficient documentation

## 2016-01-20 DIAGNOSIS — I071 Rheumatic tricuspid insufficiency: Secondary | ICD-10-CM | POA: Diagnosis not present

## 2016-01-20 DIAGNOSIS — K8020 Calculus of gallbladder without cholecystitis without obstruction: Secondary | ICD-10-CM

## 2016-01-20 DIAGNOSIS — G629 Polyneuropathy, unspecified: Secondary | ICD-10-CM

## 2016-01-20 DIAGNOSIS — Z9049 Acquired absence of other specified parts of digestive tract: Secondary | ICD-10-CM

## 2016-01-20 DIAGNOSIS — Z9221 Personal history of antineoplastic chemotherapy: Secondary | ICD-10-CM

## 2016-01-20 DIAGNOSIS — C187 Malignant neoplasm of sigmoid colon: Secondary | ICD-10-CM

## 2016-01-20 DIAGNOSIS — D649 Anemia, unspecified: Secondary | ICD-10-CM | POA: Insufficient documentation

## 2016-01-20 DIAGNOSIS — I2510 Atherosclerotic heart disease of native coronary artery without angina pectoris: Secondary | ICD-10-CM | POA: Diagnosis not present

## 2016-01-20 DIAGNOSIS — Z79899 Other long term (current) drug therapy: Secondary | ICD-10-CM

## 2016-01-20 DIAGNOSIS — I340 Nonrheumatic mitral (valve) insufficiency: Secondary | ICD-10-CM | POA: Insufficient documentation

## 2016-01-20 DIAGNOSIS — I5022 Chronic systolic (congestive) heart failure: Secondary | ICD-10-CM

## 2016-01-20 DIAGNOSIS — I10 Essential (primary) hypertension: Secondary | ICD-10-CM | POA: Insufficient documentation

## 2016-01-20 LAB — COMPREHENSIVE METABOLIC PANEL
ALT: 13 U/L — ABNORMAL LOW (ref 17–63)
AST: 19 U/L (ref 15–41)
Albumin: 4.2 g/dL (ref 3.5–5.0)
Alkaline Phosphatase: 89 U/L (ref 38–126)
Anion gap: 4 — ABNORMAL LOW (ref 5–15)
BUN: 18 mg/dL (ref 6–20)
CO2: 29 mmol/L (ref 22–32)
Calcium: 8.8 mg/dL — ABNORMAL LOW (ref 8.9–10.3)
Chloride: 107 mmol/L (ref 101–111)
Creatinine, Ser: 1.36 mg/dL — ABNORMAL HIGH (ref 0.61–1.24)
GFR calc Af Amer: 60 mL/min — ABNORMAL LOW (ref >60)
GFR calc non Af Amer: 52 mL/min — ABNORMAL LOW (ref >60)
Glucose, Bld: 92 mg/dL (ref 65–99)
Potassium: 3.5 mmol/L (ref 3.5–5.1)
Sodium: 140 mmol/L (ref 135–145)
Total Bilirubin: 1.2 mg/dL (ref 0.3–1.2)
Total Protein: 7.4 g/dL (ref 6.5–8.1)

## 2016-01-20 LAB — CBC WITH DIFFERENTIAL/PLATELET
Basophils Absolute: 0 10*3/uL (ref 0–0.1)
Basophils Relative: 1 %
Eosinophils Absolute: 0.1 10*3/uL (ref 0–0.7)
Eosinophils Relative: 4 %
HCT: 37.4 % — ABNORMAL LOW (ref 40.0–52.0)
Hemoglobin: 12.9 g/dL — ABNORMAL LOW (ref 13.0–18.0)
Lymphocytes Relative: 26 %
Lymphs Abs: 0.9 10*3/uL — ABNORMAL LOW (ref 1.0–3.6)
MCH: 32 pg (ref 26.0–34.0)
MCHC: 34.5 g/dL (ref 32.0–36.0)
MCV: 92.7 fL (ref 80.0–100.0)
Monocytes Absolute: 0.5 10*3/uL (ref 0.2–1.0)
Monocytes Relative: 15 %
Neutro Abs: 2 10*3/uL (ref 1.4–6.5)
Neutrophils Relative %: 54 %
Platelets: 131 10*3/uL — ABNORMAL LOW (ref 150–440)
RBC: 4.04 MIL/uL — ABNORMAL LOW (ref 4.40–5.90)
RDW: 14.6 % — ABNORMAL HIGH (ref 11.5–14.5)
WBC: 3.6 10*3/uL — ABNORMAL LOW (ref 3.8–10.6)

## 2016-01-20 NOTE — Progress Notes (Signed)
[facility] day:  01/20/16   Chief Complaint: Christopher Boni. is a 71 y.o. male with a history of stage IIIC colon cancer who is seen for 4 month assessment.  HPI: The patient was last seen in the medical oncology clinic on 09/12/2015.  At that time, he felt good.  He was eating well and gaining weight.  He had a residual neuropathy secondary to oxaliplatin.  He was on Neurontin.  Exam was unremarkable.  CEA was 1.6.  Chest, abdomen, and pelvic CT scan on 01/12/2016 revealed no evidence of recurrent or metastatic carcinoma.  There was cholelithiasis without radiographic evidence of cholecystitis.  There was aortic and coronary artery atherosclerosis.  Symptomatically, he denies any new complaints.  He notes a persistent neuropathy.  He states that he needs teeth.   Past Medical History:  Diagnosis Date  . Anemia   . CAD (coronary artery disease)    a. 02/2015 Cath: LM nl, LAD 50/40 mid/distal, LCX 80p, RCA 40p, 30d, RPL1 40, CO 3.27, CI 1.65; b. cath 06/09/15: LM minor irregs, m-dLAD 50%, dLAD 40%, pLCx 80% s/p PCI/DES 0%, pRCA 40%, dRCA 30%, 1st RPLB 40%  . Chronic systolic CHF (congestive heart failure) ([place])    a. 02/2015 Echo: EF 20-25%, diff HK, mod MR, mildy dil LA, mildly dil RV w/ mod RV syst dysfxn, mildly dil RA, mod TR, PASP 10mmHg.  . Colon cancer ([place])    a. 2015 s/p colectomy followed by chemoRx with oxaliplatin & fluorouacil.  Marland Kitchen Hypertension   . Hypertensive heart disease   . Mixed Ischemic and Non-ischemic Cardiomyopathy    a. 02/2015 Echo: EF 20-25%, diff HK.  . Moderate mitral regurgitation   . Moderate tricuspid regurgitation     Past Surgical History:  Procedure Laterality Date  . CARDIAC CATHETERIZATION Bilateral 02/24/2015   Procedure: Right/Left Heart Cath and Coronary Angiography;  Surgeon: Wellington Hampshire, MD;  Location: [facility];  Service: Cardiovascular;  Laterality: Bilateral;  . CARDIAC  CATHETERIZATION N/A 06/09/2015   Procedure: Coronary Stent Intervention;  Surgeon: Wellington Hampshire, MD;  Location: [facility];  Service: Cardiovascular;  Laterality: N/A;  . COLON SURGERY     Colectomy  . PORT-A-CATH REMOVAL N/A 07/12/2014   Procedure: REMOVAL PORT-A-CATH;  Surgeon: Molly Maduro, MD;  Location: [facility];  Service: General;  Laterality: N/A;  . PORTACATH PLACEMENT Left     Family History  Problem Relation Age of Onset  . Family history unknown: Yes    Social History:  reports that he has never smoked. He has never used smokeless tobacco. He reports that he does not drink alcohol or use drugs.  He has retired from the city.  The patient lives in [place].  Allergies:  Allergies  Allergen Reactions  . Shellfish Allergy Other (See Comments)    Pt. instructed by MD to avoid seafood    Current Medications: Current Outpatient Prescriptions  Medication Sig Dispense Refill  . aspirin EC 81 MG tablet Take 1 tablet (81 mg total) by mouth daily. 90 tablet 3  . atorvastatin (LIPITOR) 40 MG tablet take 1 tablet by mouth once daily 90 tablet 2  . carvedilol (COREG) 3.125 MG tablet take 1 tablet by mouth twice a day 60 tablet 3  . clopidogrel (PLAVIX) 75 MG tablet Take 1 tablet (75 mg total) by mouth daily. 30 tablet 3  . furosemide (LASIX) 40 MG tablet Take 1 tablet (40 mg  total) by mouth daily. 60 tablet 3  . gabapentin (NEURONTIN) 100 MG capsule Take 2 capsules (200 mg total) by mouth 3 (three) times daily. (Patient taking differently: Take 100 mg by mouth 3 (three) times daily. ) 180 capsule 0  . lisinopril (PRINIVIL,ZESTRIL) 40 MG tablet take 1 tablet by mouth once daily 30 tablet 3  . potassium chloride (K-DUR) 10 MEQ tablet     . spironolactone (ALDACTONE) 25 MG tablet Take 1 tablet (25 mg total) by mouth daily. (Patient not taking: Reported on 01/20/2016) 90 tablet 3   No current facility-administered medications for this visit.     Review of Systems:   GENERAL:  Feels good. No fevers or sweats.  Weight up 3 pounds. PERFORMANCE STATUS (ECOG):  0 HEENT:  No visual changes, runny nose, sore throat, mouth sores or tenderness.  Needs teeth. Lungs: No shortness of breath or cough.  No hemoptysis. Cardiac:  No chest pain, palpitations, orthopnea, or PND. GI:  Eating well.  No nausea, vomiting, diarrhea, constipation, melena or hematochezia. GU:  No urgency, frequency, dysuria, or hematuria. Musculoskeletal:  No back pain.  No joint pain.  No muscle tenderness. Extremities:  No pain or swelling. Skin:  No rashes or skin changes. Neuro:  Neuropathy in fingers and toes, stable.  No headache, numbness or weakness, balance or coordination issues. Endocrine:  No diabetes, thyroid issues, hot flashes or night sweats. Psych:  No mood changes, depression or anxiety. Pain:  No focal pain. Review of systems:  All other systems reviewed and found to be negative.  Physical Exam: Blood pressure 126/73, pulse 65, temperature (!) 95.8 F (35.4 C), temperature source Tympanic, resp. rate 18, weight 193 lb 12.6 oz (87.9 kg). GENERAL:  Well developed, well nourished, gentleman sitting comfortably in the exam room in no acute distress. MENTAL STATUS:  Alert and oriented to person, place and time. HEAD:  Near alopecia.  Normocephalic, atraumatic, face symmetric, no Cushingoid features. EYES:  Brown eyes.  Pupils equal round and reactive to light and accomodation.  No conjunctivitis or scleral icterus. ENT:  Oropharynx clear without lesion.  Poor dentition.  Tongue normal.  Mucous membranes moist.  RESPIRATORY:  Clear to auscultation without rales, wheezes or rhonchi. CARDIOVASCULAR:  Regular rate and rhythm without murmur, rub or gallop. No JVD. ABDOMEN:  Soft, non-tender, with active bowel sounds, and no hepatosplenomegaly.  No masses. SKIN:  No rashes, ulcers or lesions. EXTREMITIES:  No edema, no skin discoloration or tenderness.  No palpable cords. LYMPH  NODES: No palpable cervical, supraclavicular, axillary or inguinal adenopathy  NEUROLOGICAL: Unremarkable. PSYCH:  Appropriate.   Appointment on 01/20/2016  Component Date Value Ref Range Status  . WBC 01/20/2016 3.6* 3.8 - 10.6 K/uL Final  . RBC 01/20/2016 4.04* 4.40 - 5.90 MIL/uL Final  . Hemoglobin 01/20/2016 12.9* 13.0 - 18.0 g/dL Final  . HCT 01/20/2016 37.4* 40.0 - 52.0 % Final  . MCV 01/20/2016 92.7  80.0 - 100.0 fL Final  . MCH 01/20/2016 32.0  26.0 - 34.0 pg Final  . MCHC 01/20/2016 34.5  32.0 - 36.0 g/dL Final  . RDW 01/20/2016 14.6* 11.5 - 14.5 % Final  . Platelets 01/20/2016 131* 150 - 440 K/uL Final  . Neutrophils Relative % 01/20/2016 54  % Final  . Neutro Abs 01/20/2016 2.0  1.4 - 6.5 K/uL Final  . Lymphocytes Relative 01/20/2016 26  % Final  . Lymphs Abs 01/20/2016 0.9* 1.0 - 3.6 K/uL Final  . Monocytes Relative 01/20/2016 15  %  Final  . Monocytes Absolute 01/20/2016 0.5  0.2 - 1.0 K/uL Final  . Eosinophils Relative 01/20/2016 4  % Final  . Eosinophils Absolute 01/20/2016 0.1  0 - 0.7 K/uL Final  . Basophils Relative 01/20/2016 1  % Final  . Basophils Absolute 01/20/2016 0.0  0 - 0.1 K/uL Final    Assessment:  [name]. is a 70 y.o. male with stage IIIC colon cancer. He presented with symptomatic anemia.  He underwent descending and proximal sigmoid colectomy, left ureteral lysis, and partial cecectomy on 06/23/2013. Six of 22 lymph nodes were positive.  There were tumor deposits (discontinuous extramural extension).  There was lymphovascular and perineural invasion. Pathologic stage was IIIC (T4bN2a M0).  He received FOLFOX chemotherapy from 07/22/2013 until 01/06/2014.   Chest, abdomen, and pelvic CT scan on 01/11/2014 revealed no evidence of metastatic disease. He was noted to have stable splenomegaly. There was some progression of tree-in-bud changes in the lungs, questionable MAC infection. There were chronic inflammatory changes.  Chest, abdomen, and  pelvic CT on 01/12/2015 revealed no evidence of metastatic disease in the chest, abdomen or pelvis.  There was a solitary 4 mm right upper lobe pulmonary nodule (stable). There was mild cardiomegaly (increased).  There was mild patchy ground-glass opacity and interlobular septal thickening throughout both lungs, suggestive of pulmonary edema, concerning for developing congestive heart failure.  There was stable dilated main pulmonary artery, suggesting pulmonary arterial hypertension.  There was left main and 3 vessel coronary atherosclerosis.    Chest, abdomen, and pelvic CT on 01/12/2016 revealed no evidence of recurrent or metastatic carcinoma.  There was cholelithiasis without radiographic evidence of cholecystitis.  There was aortic and coronary artery atherosclerosis.  CEA was 4.3 on 06/20/2013, 1.8 on 01/06/2014, 1.5 on 09/16/2014, 1.1 on 01/14/2015, 1.5 on 05/16/2015, 1.6 on 09/12/2015, and 1.3 on 01/20/2016.  Colonoscopy on 04/12/2014 revealed several polyps.  There was no dysplasia or malignancy. Lower extremity duplex on 06/15/2014 revealed no evidence of DVT.  He has a mild normocytic anemia.  Diet is good.  He denies any melena or hematochezia.  Echocardiogram revealed an EF was 20-25% on 02/18/2015.  Cardiac catheterization on 02/24/2015 revealed 80% occlusion of the proximal left circumflex.  He is being managed medically.  Symptomatically, he feels good.  He has a stable residual neuropathy secondary to oxaliplatin. He is on Neurontin.  Exam is unremarkable.  Plan: 1.  Labs today: CBC with diff, CMP, CEA.  2.  Review chest, abdomen, pelvic CT scan.  No evidence of metastatic disease. 3.  RTC in 6 months for MD assessment and labs (CBC with diff, CMP, CEA).   Lequita Asal, MD  01/20/16, 4:18 PM

## 2016-01-20 NOTE — Progress Notes (Signed)
Patient here today for CT results.  Offers no complaints today. 

## 2016-01-21 LAB — CEA: CEA: 1.3 ng/mL (ref 0.0–4.7)

## 2016-01-22 ENCOUNTER — Other Ambulatory Visit: Payer: Self-pay | Admitting: *Deleted

## 2016-01-22 DIAGNOSIS — C187 Malignant neoplasm of sigmoid colon: Secondary | ICD-10-CM

## 2016-02-22 ENCOUNTER — Other Ambulatory Visit: Payer: Self-pay | Admitting: Cardiovascular Disease

## 2016-02-24 ENCOUNTER — Other Ambulatory Visit: Payer: Self-pay | Admitting: Cardiovascular Disease

## 2016-05-24 ENCOUNTER — Other Ambulatory Visit: Payer: Self-pay | Admitting: Nurse Practitioner

## 2016-05-24 ENCOUNTER — Other Ambulatory Visit: Payer: Self-pay | Admitting: Cardiovascular Disease

## 2016-06-25 ENCOUNTER — Other Ambulatory Visit: Payer: Self-pay | Admitting: Cardiovascular Disease

## 2016-07-20 ENCOUNTER — Other Ambulatory Visit: Payer: Medicare HMO

## 2016-07-20 ENCOUNTER — Ambulatory Visit: Payer: Medicare HMO | Admitting: Hematology and Oncology

## 2016-07-24 DIAGNOSIS — Z85038 Personal history of other malignant neoplasm of large intestine: Secondary | ICD-10-CM | POA: Diagnosis not present

## 2016-07-24 DIAGNOSIS — R1013 Epigastric pain: Secondary | ICD-10-CM | POA: Diagnosis not present

## 2016-08-01 ENCOUNTER — Other Ambulatory Visit: Payer: Self-pay | Admitting: Cardiovascular Disease

## 2016-08-02 ENCOUNTER — Encounter: Payer: Self-pay | Admitting: Cardiovascular Disease

## 2016-08-02 ENCOUNTER — Ambulatory Visit (INDEPENDENT_AMBULATORY_CARE_PROVIDER_SITE_OTHER): Payer: PPO | Admitting: Cardiovascular Disease

## 2016-08-02 VITALS — BP 152/90 | HR 60 | Ht 69.0 in | Wt 191.5 lb

## 2016-08-02 DIAGNOSIS — I2510 Atherosclerotic heart disease of native coronary artery without angina pectoris: Secondary | ICD-10-CM

## 2016-08-02 DIAGNOSIS — I5022 Chronic systolic (congestive) heart failure: Secondary | ICD-10-CM

## 2016-08-02 DIAGNOSIS — I10 Essential (primary) hypertension: Secondary | ICD-10-CM

## 2016-08-02 MED ORDER — LISINOPRIL 40 MG PO TABS: 40.0000 mg | ORAL_TABLET | Freq: Every day | ORAL | 2 refills | Status: DC

## 2016-08-02 MED ORDER — FUROSEMIDE 40 MG PO TABS: 40.0000 mg | ORAL_TABLET | Freq: Every day | ORAL | 2 refills | Status: DC

## 2016-08-02 MED ORDER — AMLODIPINE BESYLATE 10 MG PO TABS: 10.0000 mg | ORAL_TABLET | Freq: Every day | ORAL | 2 refills | Status: DC

## 2016-08-02 MED ORDER — CARVEDILOL 3.125 MG PO TABS: 3.1250 mg | ORAL_TABLET | Freq: Two times a day (BID) | ORAL | 2 refills | Status: DC

## 2016-08-02 MED ORDER — ATORVASTATIN CALCIUM 40 MG PO TABS: 40.0000 mg | ORAL_TABLET | Freq: Every day | ORAL | 2 refills | Status: DC

## 2016-08-02 MED ORDER — SPIRONOLACTONE 25 MG PO TABS: 25.0000 mg | ORAL_TABLET | Freq: Every day | ORAL | 2 refills | Status: DC

## 2016-08-02 NOTE — Progress Notes (Signed)
Cardiology Office Note   Date:  08/02/2016   ID:  [name]., DOB 1945-10-31, MRN [id number]  PCP:  Leone Haven, MD  Cardiologist:   Kathlyn Sacramento, MD   Chief Complaint  Patient presents with  . other    No complaints today . Meds reviewed verbally with pt.      History of Present Illness: [name]. is a 71 y.o. male who presents for a follow-up visit regarding chronic systolic heart failure and coronary artery disease. He has known history of colon cancer status post colectomy in 2015 followed by chemotherapy with oxaliplatin and fluorouracil. He had neuropathy as a result.  He is not a smoker and has not had any alcohol in 10 years. He is not diabetic.   He was diagnosed with acute systolic heart failure in January 2017. Echocardiogram showed severely reduced LV systolic function with an ejection fraction of 20-25%, moderate mitral regurgitation, moderate tricuspid regurgitation with moderate to severe pulmonary hypertension. Right and left cardiac catheterization showed moderate to severe pulmonary hypertension with severely elevated filling pressures with severely reduced cardiac output. Coronary angiography showed severe one-vessel coronary artery disease with 80-90% stenosis in the proximal left circumflex with mild diffuse disease affecting the LAD and RCA. He was started on heart failure medications and subsequently underwent staged PCI of the left circumflex in May 2017.  Most recent echocardiogram in December 2017 showed an EF of 35-40%, mild mitral regurgitation and moderately dilated left atrium.  He has been doing well overall with no chest pain or shortness of breath. He is supposed to get colonoscopy in the near future. He has been taking his medications regularly.  Past Medical History:  Diagnosis Date  . Anemia   . CAD (coronary artery disease)    a. 02/2015 Cath: LM nl, LAD 50/40 mid/distal, LCX 80p, RCA 40p, 30d, RPL1 40, CO 3.27, CI 1.65;  b. cath 06/09/15: LM minor irregs, m-dLAD 50%, dLAD 40%, pLCx 80% s/p PCI/DES 0%, pRCA 40%, dRCA 30%, 1st RPLB 40%  . Chronic systolic CHF (congestive heart failure) ([place])    a. 02/2015 Echo: EF 20-25%, diff HK, mod MR, mildy dil LA, mildly dil RV w/ mod RV syst dysfxn, mildly dil RA, mod TR, PASP 30mmHg.  . Colon cancer ([place])    a. 2015 s/p colectomy followed by chemoRx with oxaliplatin & fluorouacil.  Marland Kitchen Hypertension   . Hypertensive heart disease   . Mixed Ischemic and Non-ischemic Cardiomyopathy    a. 02/2015 Echo: EF 20-25%, diff HK.  . Moderate mitral regurgitation   . Moderate tricuspid regurgitation     Past Surgical History:  Procedure Laterality Date  . CARDIAC CATHETERIZATION Bilateral 02/24/2015   Procedure: Right/Left Heart Cath and Coronary Angiography;  Surgeon: Wellington Hampshire, MD;  Location: [place] [facility];  Service: Cardiovascular;  Laterality: Bilateral;  . CARDIAC CATHETERIZATION N/A 06/09/2015   Procedure: Coronary Stent Intervention;  Surgeon: Wellington Hampshire, MD;  Location: [place] [facility];  Service: Cardiovascular;  Laterality: N/A;  . COLON SURGERY     Colectomy  . PORT-A-CATH REMOVAL N/A 07/12/2014   Procedure: REMOVAL PORT-A-CATH;  Surgeon: Molly Maduro, MD;  Location: [facility];  Service: General;  Laterality: N/A;  . PORTACATH PLACEMENT Left      Current Outpatient Prescriptions  Medication Sig Dispense Refill  . amLODipine (NORVASC) 5 MG tablet Take 5 mg by mouth daily.    Marland Kitchen aspirin EC 81 MG tablet Take 1  tablet (81 mg total) by mouth daily. 90 tablet 3  . atorvastatin (LIPITOR) 40 MG tablet take 1 tablet by mouth once daily 90 tablet 0  . carvedilol (COREG) 3.125 MG tablet take 1 tablet by mouth twice a day 60 tablet 3  . clopidogrel (PLAVIX) 75 MG tablet take 1 tablet by mouth once daily 30 tablet 2  . furosemide (LASIX) 40 MG tablet take 1 tablet by mouth once daily 30 tablet 0  . gabapentin (NEURONTIN) 100 MG capsule Take 2 capsules (200 mg  total) by mouth 3 (three) times daily. (Patient taking differently: Take 100 mg by mouth 3 (three) times daily. ) 180 capsule 0  . lisinopril (PRINIVIL,ZESTRIL) 40 MG tablet take 1 tablet by mouth once daily 90 tablet 0  . omeprazole (PRILOSEC) 20 MG capsule Take 20 mg by mouth daily.    Marland Kitchen spironolactone (ALDACTONE) 25 MG tablet Take 1 tablet (25 mg total) by mouth daily. 90 tablet 3   No current facility-administered medications for this visit.     Allergies:   Shellfish allergy    Social History:  The patient  reports that he has never smoked. He has never used smokeless tobacco. He reports that he does not drink alcohol or use drugs.   Family History:  The patient's Family history is unknown by patient.    ROS:  Please see the history of present illness.   Otherwise, review of systems are positive for none.   All other systems are reviewed and negative.    PHYSICAL EXAM: VS:  BP (!) 152/90 (BP Location: Left Arm, Patient Position: Sitting, Cuff Size: Normal)   Pulse 60   Ht 5\' 9"  (1.753 m)   Wt 191 lb 8 oz (86.9 kg)   BMI 28.28 kg/m  , BMI Body mass index is 28.28 kg/m. GEN: Well nourished, well developed, in no acute distress  HEENT: normal  Neck: no JVD, carotid bruits, or masses Cardiac: RRR; no murmurs, rubs, or gallops,no edema  Respiratory:  clear to auscultation bilaterally, normal work of breathing GI: soft, nontender, nondistended, + BS MS: no deformity or atrophy  Skin: warm and dry, no rash Neuro:  Strength and sensation are intact Psych: euthymic mood, full affect   EKG:  EKG is ordered today. EKG show normal sinus rhythm with sinus arrhythmia. Heart rate is 60 bpm.    Recent Labs: 01/20/2016: ALT 13; BUN 18; Creatinine, Ser 1.36; Hemoglobin 12.9; Platelets 131; Potassium 3.5; Sodium 140    Lipid Panel No results found for: CHOL, TRIG, HDL, CHOLHDL, VLDL, LDLCALC, LDLDIRECT    Wt Readings from Last 3 Encounters:  08/02/16 191 lb 8 oz (86.9 kg)    01/20/16 193 lb 12.6 oz (87.9 kg)  12/19/15 192 lb 8 oz (87.3 kg)        ASSESSMENT AND PLAN:  1.  Coronary artery disease involving native coronary arteries with other forms of angina:  He is status post PCI and drug-eluting stent placement to the left circumflex  In May 2017. I elected to discontinue clopidogrel today. Continue aspirin indefinitely.  2. Chronic systolic heart failure:  He appears to be euvolemic on current dose of furosemide. He is currently New York Heart Association class II.  most recent ejection fraction was 35-40%. Not able to increase the dose of carvedilol due to relative bradycardia. I will consider switching lisinopril to [facility] in the future.  Check basic metabolic profile today.  3. Essential hypertension: Blood pressure mildly elevated. I increased amlodipine  to 10 mg daily.  4. Hyperlipidemia: Continue treatment with atorvastatin . I requested a lipid and liver profile.    Disposition:   FU with me in 6 months  Signed,  Kathlyn Sacramento, MD  08/02/2016 11:04 AM    [place]

## 2016-08-02 NOTE — Patient Instructions (Signed)
Medication Instructions:  Your physician has recommended you make the following change in your medication:  INCREASE amlodipine to 10mg  once daily STOP taking clopidogrel   Labwork: BMET, liver and lipid profile today  Testing/Procedures: none  Follow-Up: Your physician wants you to follow-up in: 6 months with Dr. Fletcher Anon.  You will receive a reminder letter in the mail two months in advance. If you don't receive a letter, please call our office to schedule the follow-up appointment.   Any Other Special Instructions Will Be Listed Below (If Applicable).     If you need a refill on your cardiac medications before your next appointment, please call your pharmacy.

## 2016-08-03 ENCOUNTER — Telehealth: Payer: Self-pay | Admitting: Cardiovascular Disease

## 2016-08-03 LAB — HEPATIC FUNCTION PANEL
ALK PHOS: 98 IU/L (ref 39–117)
ALT: 10 IU/L (ref 0–44)
AST: 14 IU/L (ref 0–40)
Albumin: 4.4 g/dL (ref 3.6–4.8)
BILIRUBIN TOTAL: 0.7 mg/dL (ref 0.0–1.2)
Bilirubin, Direct: 0.21 mg/dL (ref 0.00–0.40)
Total Protein: 7 g/dL (ref 6.0–8.5)

## 2016-08-03 LAB — LIPID PANEL
CHOLESTEROL TOTAL: 143 mg/dL (ref 100–199)
Chol/HDL Ratio: 2.1 ratio (ref 0.0–5.0)
HDL: 69 mg/dL (ref >39)
LDL CALC: 64 mg/dL (ref 0–99)
Triglycerides: 48 mg/dL (ref 0–149)
VLDL Cholesterol Cal: 10 mg/dL (ref 5–40)

## 2016-08-03 LAB — BASIC METABOLIC PANEL
BUN / CREAT RATIO: 8 — AB (ref 10–24)
BUN: 11 mg/dL (ref 8–27)
CO2: 25 mmol/L (ref 20–29)
CREATININE: 1.38 mg/dL — AB (ref 0.76–1.27)
Calcium: 9.1 mg/dL (ref 8.6–10.2)
Chloride: 106 mmol/L (ref 96–106)
GFR calc non Af Amer: 52 mL/min/{1.73_m2} — ABNORMAL LOW (ref >59)
GFR, EST AFRICAN AMERICAN: 60 mL/min/{1.73_m2} (ref >59)
Glucose: 137 mg/dL — ABNORMAL HIGH (ref 65–99)
Potassium: 3.9 mmol/L (ref 3.5–5.2)
Sodium: 144 mmol/L (ref 134–144)

## 2016-08-03 NOTE — Telephone Encounter (Signed)
Risk Assessment Request for 11/6 colonoscopy with [facility], Dr. Gustavo Lah, placed in MD basket.

## 2016-08-06 NOTE — Telephone Encounter (Signed)
Faxed clearance to [facility] [phone]

## 2016-08-07 NOTE — Addendum Note (Signed)
Addended by: Britt Bottom on: 08/07/2016 04:37 PM   Modules accepted: Orders

## 2016-08-24 ENCOUNTER — Inpatient Hospital Stay: Payer: PPO | Attending: Hematology and Oncology

## 2016-08-24 ENCOUNTER — Inpatient Hospital Stay (HOSPITAL_BASED_OUTPATIENT_CLINIC_OR_DEPARTMENT_OTHER): Payer: PPO | Admitting: Hematology and Oncology

## 2016-08-24 ENCOUNTER — Encounter: Payer: Self-pay | Admitting: Hematology and Oncology

## 2016-08-24 ENCOUNTER — Other Ambulatory Visit: Payer: Self-pay | Admitting: *Deleted

## 2016-08-24 VITALS — BP 127/69 | HR 63 | Temp 96.6°F | Resp 18 | Wt 193.4 lb

## 2016-08-24 DIAGNOSIS — I5022 Chronic systolic (congestive) heart failure: Secondary | ICD-10-CM | POA: Diagnosis not present

## 2016-08-24 DIAGNOSIS — Z9049 Acquired absence of other specified parts of digestive tract: Secondary | ICD-10-CM

## 2016-08-24 DIAGNOSIS — G629 Polyneuropathy, unspecified: Secondary | ICD-10-CM | POA: Insufficient documentation

## 2016-08-24 DIAGNOSIS — Z79899 Other long term (current) drug therapy: Secondary | ICD-10-CM | POA: Diagnosis not present

## 2016-08-24 DIAGNOSIS — C187 Malignant neoplasm of sigmoid colon: Secondary | ICD-10-CM | POA: Insufficient documentation

## 2016-08-24 DIAGNOSIS — I340 Nonrheumatic mitral (valve) insufficiency: Secondary | ICD-10-CM | POA: Diagnosis not present

## 2016-08-24 DIAGNOSIS — D508 Other iron deficiency anemias: Secondary | ICD-10-CM

## 2016-08-24 DIAGNOSIS — Z7982 Long term (current) use of aspirin: Secondary | ICD-10-CM | POA: Insufficient documentation

## 2016-08-24 DIAGNOSIS — I2510 Atherosclerotic heart disease of native coronary artery without angina pectoris: Secondary | ICD-10-CM

## 2016-08-24 DIAGNOSIS — D649 Anemia, unspecified: Secondary | ICD-10-CM | POA: Diagnosis not present

## 2016-08-24 LAB — COMPREHENSIVE METABOLIC PANEL
ALT: 13 U/L — ABNORMAL LOW (ref 17–63)
AST: 20 U/L (ref 15–41)
Albumin: 4.1 g/dL (ref 3.5–5.0)
Alkaline Phosphatase: 90 U/L (ref 38–126)
Anion gap: 7 (ref 5–15)
BUN: 18 mg/dL (ref 6–20)
CO2: 25 mmol/L (ref 22–32)
Calcium: 9.2 mg/dL (ref 8.9–10.3)
Chloride: 107 mmol/L (ref 101–111)
Creatinine, Ser: 1.61 mg/dL — ABNORMAL HIGH (ref 0.61–1.24)
GFR calc Af Amer: 49 mL/min — ABNORMAL LOW (ref >60)
GFR calc non Af Amer: 42 mL/min — ABNORMAL LOW (ref >60)
Glucose, Bld: 230 mg/dL — ABNORMAL HIGH (ref 65–99)
Potassium: 3.6 mmol/L (ref 3.5–5.1)
Sodium: 139 mmol/L (ref 135–145)
Total Bilirubin: 0.9 mg/dL (ref 0.3–1.2)
Total Protein: 7.1 g/dL (ref 6.5–8.1)

## 2016-08-24 LAB — CBC WITH DIFFERENTIAL/PLATELET
Basophils Absolute: 0 10*3/uL (ref 0–0.1)
Basophils Relative: 1 %
Eosinophils Absolute: 0.1 10*3/uL (ref 0–0.7)
Eosinophils Relative: 2 %
HCT: 35.2 % — ABNORMAL LOW (ref 40.0–52.0)
Hemoglobin: 12.4 g/dL — ABNORMAL LOW (ref 13.0–18.0)
Lymphocytes Relative: 24 %
Lymphs Abs: 0.8 10*3/uL — ABNORMAL LOW (ref 1.0–3.6)
MCH: 32.5 pg (ref 26.0–34.0)
MCHC: 35.1 g/dL (ref 32.0–36.0)
MCV: 92.4 fL (ref 80.0–100.0)
Monocytes Absolute: 0.4 10*3/uL (ref 0.2–1.0)
Monocytes Relative: 13 %
Neutro Abs: 2 10*3/uL (ref 1.4–6.5)
Neutrophils Relative %: 60 %
Platelets: 140 10*3/uL — ABNORMAL LOW (ref 150–440)
RBC: 3.81 MIL/uL — ABNORMAL LOW (ref 4.40–5.90)
RDW: 14.8 % — ABNORMAL HIGH (ref 11.5–14.5)
WBC: 3.3 10*3/uL — ABNORMAL LOW (ref 3.8–10.6)

## 2016-08-24 NOTE — Progress Notes (Signed)
Patient offers no complaints today. 

## 2016-08-24 NOTE — Progress Notes (Signed)
[facility] day:  08/24/16   Chief Complaint: [name]. is a 71 y.o. male with a history of stage IIIC colon cancer who is seen for 6 month assessment.  HPI: The patient was last seen in the medical oncology clinic on 01/20/2016.  At that time, he felt good.  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 He was on Neurontin.  Exam was unremarkable.  CEA was 1.3.  During the interim, he has felt good. He remains on Neurontin for his neuropathy.  He is scheduled for colonoscopy by Dr. Gustavo Lah on 12/11/2016.   Past Medical History:  Diagnosis Date  . Anemia   . CAD (coronary artery disease)    a. 02/2015 Cath: LM nl, LAD 50/40 mid/distal, LCX 80p, RCA 40p, 30d, RPL1 40, CO 3.27, CI 1.65; b. cath 06/09/15: LM minor irregs, m-dLAD 50%, dLAD 40%, pLCx 80% s/p PCI/DES 0%, pRCA 40%, dRCA 30%, 1st RPLB 40%  . Chronic systolic CHF (congestive heart failure) ([place])    a. 02/2015 Echo: EF 20-25%, diff HK, mod MR, mildy dil LA, mildly dil RV w/ mod RV syst dysfxn, mildly dil RA, mod TR, PASP 59mHg.  . [place]    a. 2015 s/p colectomy followed by chemoRx with oxaliplatin & fluorouacil.  .Marland KitchenHypertension   . Hypertensive heart disease   . Mixed Ischemic and Non-ischemic Cardiomyopathy    a. 02/2015 Echo: EF 20-25%, diff HK.  . Moderate mitral regurgitation   . Moderate tricuspid regurgitation     Past Surgical History:  Procedure Laterality Date  . CARDIAC CATHETERIZATION Bilateral 02/24/2015   Procedure: Right/Left Heart Cath and Coronary Angiography;  Surgeon: MWellington Hampshire MD;  [facility];  Service: Cardiovascular;  Laterality: Bilateral;  . CARDIAC CATHETERIZATION N/A 06/09/2015   Procedure: Coronary Stent Intervention;  Surgeon: MWellington Hampshire MD;  [facility];  Service: Cardiovascular;  Laterality: N/A;  . COLON SURGERY     Colectomy  . PORT-A-CATH REMOVAL N/A 07/12/2014   Procedure: REMOVAL PORT-A-CATH;  Surgeon: WMolly Maduro MD;  Location: [facility];  Service: General;  Laterality: N/A;  . PORTACATH PLACEMENT Left     Family History  Problem Relation Age of Onset  . Family history unknown: Yes    Social History:  reports that he has never smoked. He has never used smokeless tobacco. He reports that he does not drink alcohol or use drugs.  [place]  [place]  He is accompanied by a woman today.  Allergies:  Allergies  Allergen Reactions  . Shellfish Allergy Other (See Comments)    Pt. instructed by MD to avoid seafood    Current Medications: Current Outpatient Prescriptions  Medication Sig Dispense Refill  . amLODipine (NORVASC) 10 MG tablet Take 1 tablet (10 mg total) by mouth daily. 90 tablet 2  . aspirin EC 81 MG tablet Take 1 tablet (81 mg total) by mouth daily. 90 tablet 3  . atorvastatin (LIPITOR) 40 MG tablet Take 1 tablet (40 mg total) by mouth daily. 90 tablet 2  . carvedilol (COREG) 3.125 MG tablet Take 1 tablet (3.125 mg total) by mouth 2 (two) times daily. 180 tablet 2  . furosemide (LASIX) 40 MG tablet Take 1 tablet (40 mg total) by mouth daily. 90 tablet 2  . gabapentin (NEURONTIN) 100 MG capsule Take 2 capsules (200 mg total) by mouth 3 (three)  times daily. (Patient taking differently: Take 100 mg by mouth 3 (three) times daily. ) 180 capsule 0  . lisinopril (PRINIVIL,ZESTRIL) 40 MG tablet Take 1 tablet (40 mg total) by mouth daily. 90 tablet 2  . omeprazole (PRILOSEC) 20 MG capsule Take 20 mg by mouth daily.    Marland Kitchen spironolactone (ALDACTONE) 25 MG tablet Take 1 tablet (25 mg total) by mouth daily. 90 tablet 2   No current facility-administered medications for this visit.     Review of Systems:  GENERAL:  Feels good. No fevers or sweats.  Weight stable. PERFORMANCE STATUS (ECOG):  0 HEENT:  No visual changes, runny nose, sore throat, mouth sores or tenderness.  Needs teeth. Lungs: No  shortness of breath or cough.  No hemoptysis. Cardiac:  No chest pain, palpitations, orthopnea, or PND. GI:  No nausea, vomiting, diarrhea, constipation, melena or hematochezia. GU:  No urgency, frequency, dysuria, or hematuria. Musculoskeletal:  No back pain.  No joint pain.  No muscle tenderness. Extremities:  No pain or swelling. Skin:  No rashes or skin changes. Neuro:  Neuropathy in fingers and toes, stable (on Neurontin).  No headache, numbness or weakness, balance or coordination issues. Endocrine:  No diabetes, thyroid issues, hot flashes or night sweats. Psych:  No mood changes, depression or anxiety. Pain:  No focal pain. Review of systems:  All other systems reviewed and found to be negative.  Physical Exam: Blood pressure 127/69, pulse 63, temperature (!) 96.6 F (35.9 C), temperature source Tympanic, resp. rate 18, weight 193 lb 7 oz (87.7 kg). GENERAL:  Well developed, well nourished, gentleman sitting comfortably in the exam room in no acute distress. MENTAL STATUS:  Alert and oriented to person, place and time. HEAD:  Near alopecia.  Normocephalic, atraumatic, face symmetric, no Cushingoid features. EYES:  Brown eyes.  Pupils equal round and reactive to light and accomodation.  No conjunctivitis or scleral icterus. ENT:  Oropharynx clear without lesion.  Poor dentition.  Tongue normal.  Mucous membranes moist.  RESPIRATORY:  Clear to auscultation without rales, wheezes or rhonchi. CARDIOVASCULAR:  Regular rate and rhythm without murmur, rub or gallop. No JVD. ABDOMEN:  Soft, non-tender, with active bowel sounds, and no hepatosplenomegaly.  No masses. SKIN:  No rashes, ulcers or lesions. EXTREMITIES:  No edema, no skin discoloration or tenderness.  No palpable cords. LYMPH NODES: No palpable cervical, supraclavicular, axillary or inguinal adenopathy  NEUROLOGICAL: Unremarkable. PSYCH:  Appropriate.  Imaging studies: 01/11/2014:  Chest, abdomen, and pelvic CT revealed no  evidence of metastatic disease. He was noted to have stable splenomegaly. There was some progression of tree-in-bud changes in the lungs, questionable MAC infection. There were chronic inflammatory changes. 01/12/2015:  Chest, abdomen, and pelvic CT revealed no evidence of metastatic disease in the chest, abdomen or pelvis.  There was a solitary 4 mm right upper lobe pulmonary nodule (stable). There was mild cardiomegaly (increased).  There was mild patchy ground-glass opacity and interlobular septal thickening throughout both lungs, suggestive of pulmonary edema, concerning for developing congestive heart failure.  There was stable dilated main pulmonary artery, suggesting pulmonary arterial hypertension.  There was left main and 3 vessel coronary atherosclerosis.   01/12/2016:  Chest, abdomen, and pelvic CT revealed no evidence of recurrent or metastatic carcinoma.  There was cholelithiasis without radiographic evidence of cholecystitis.  There was aortic and coronary artery atherosclerosis.   Appointment on 08/24/2016  Component Date Value Ref Range Status  . WBC 08/24/2016 3.3* 3.8 - 10.6 K/uL Final  .  RBC 08/24/2016 3.81* 4.40 - 5.90 MIL/uL Final  . Hemoglobin 08/24/2016 12.4* 13.0 - 18.0 g/dL Final  . HCT 08/24/2016 35.2* 40.0 - 52.0 % Final  . MCV 08/24/2016 92.4  80.0 - 100.0 fL Final  . MCH 08/24/2016 32.5  26.0 - 34.0 pg Final  . MCHC 08/24/2016 35.1  32.0 - 36.0 g/dL Final  . RDW 08/24/2016 14.8* 11.5 - 14.5 % Final  . Platelets 08/24/2016 140* 150 - 440 K/uL Final  . Neutrophils Relative % 08/24/2016 60  % Final  . Neutro Abs 08/24/2016 2.0  1.4 - 6.5 K/uL Final  . Lymphocytes Relative 08/24/2016 24  % Final  . Lymphs Abs 08/24/2016 0.8* 1.0 - 3.6 K/uL Final  . Monocytes Relative 08/24/2016 13  % Final  . Monocytes Absolute 08/24/2016 0.4  0.2 - 1.0 K/uL Final  . Eosinophils Relative 08/24/2016 2  % Final  . Eosinophils Absolute 08/24/2016 0.1  0 - 0.7 K/uL Final  . Basophils  Relative 08/24/2016 1  % Final  . Basophils Absolute 08/24/2016 0.0  0 - 0.1 K/uL Final  . Sodium 08/24/2016 139  135 - 145 mmol/L Final  . Potassium 08/24/2016 3.6  3.5 - 5.1 mmol/L Final  . Chloride 08/24/2016 107  101 - 111 mmol/L Final  . CO2 08/24/2016 25  22 - 32 mmol/L Final  . Glucose, Bld 08/24/2016 230* 65 - 99 mg/dL Final  . BUN 08/24/2016 18  6 - 20 mg/dL Final  . Creatinine, Ser 08/24/2016 1.61* 0.61 - 1.24 mg/dL Final  . Calcium 08/24/2016 9.2  8.9 - 10.3 mg/dL Final  . Total Protein 08/24/2016 7.1  6.5 - 8.1 g/dL Final  . Albumin 08/24/2016 4.1  3.5 - 5.0 g/dL Final  . AST 08/24/2016 20  15 - 41 U/L Final  . ALT 08/24/2016 13* 17 - 63 U/L Final  . Alkaline Phosphatase 08/24/2016 90  38 - 126 U/L Final  . Total Bilirubin 08/24/2016 0.9  0.3 - 1.2 mg/dL Final  . GFR calc non Af Amer 08/24/2016 42* >60 mL/min Final  . GFR calc Af Amer 08/24/2016 49* >60 mL/min Final   Comment: (NOTE) The eGFR has been calculated using the CKD EPI equation. This calculation has not been validated in all clinical situations. eGFR's persistently <60 mL/min signify possible Chronic Kidney Disease.   Georgiann Hahn gap 08/24/2016 7  5 - 15 Final    Assessment:  [name]. is a 71 y.o. male with stage IIIC colon cancer. He presented with symptomatic anemia.  He underwent descending and proximal sigmoid colectomy, left ureteral lysis, and partial cecectomy on 06/23/2013. Six of 22 lymph nodes were positive.  There were tumor deposits (discontinuous extramural extension).  There was lymphovascular and perineural invasion. Pathologic stage was IIIC (T4bN2a M0).  He received FOLFOX chemotherapy from 07/22/2013 until 01/06/2014.    Chest, abdomen, and pelvic CT on 01/12/2016 revealed no evidence of recurrent or metastatic carcinoma.   CEA was 4.3 on 06/20/2013, 1.8 on 01/06/2014, 1.5 on 09/16/2014, 1.1 on 01/14/2015, 1.5 on 05/16/2015, 1.6 on 09/12/2015, and 1.3 on 01/20/2016.  Colonoscopy on  04/12/2014 revealed several polyps.  There was no dysplasia or malignancy. Lower extremity duplex on 06/15/2014 revealed no evidence of DVT.  He has a mild normocytic anemia.  Diet is good.  He denies any melena or hematochezia.  Echocardiogram revealed an EF was 20-25% on 02/18/2015.  Cardiac catheterization on 02/24/2015 revealed 80% occlusion of the proximal left circumflex.  He is  being managed medically.  Symptomatically, he feels good.  He has a stable residual neuropathy secondary to oxaliplatin. He is on Neurontin.  Exam is stable.  Creatinine is 1.61 (baseline Cr 1.25 - 1.38).  Plan: 1.  Labs today: CBC with diff, CMP, CEA.  2.  Discus increase in creatinine.  Discuss follow-up with Dr. Caryl Bis and Dr. Juleen China.  Discuss checking creatinine prior to yearly CT in 01/2017.  If creatinine remains elevated, scans will be performed without contrast. 3.  Labs 2 days prior to scans:  CBC with diff, CMP, CEA. 4.  Chest, abdomen, pelvic CT scan on 01/11/2017. 5.  Colonoscopy scheduled for 12/11/2016. 6.  RTC in 6 months for MD assessment, review of labs and imaging.   Lequita Asal, MD  08/24/16, 3:06 PM

## 2016-08-30 LAB — CEA: CEA: 1.6 ng/mL (ref 0.0–4.7)

## 2016-10-17 DIAGNOSIS — N2581 Secondary hyperparathyroidism of renal origin: Secondary | ICD-10-CM | POA: Diagnosis not present

## 2016-10-17 DIAGNOSIS — N183 Chronic kidney disease, stage 3 (moderate): Secondary | ICD-10-CM | POA: Diagnosis not present

## 2016-10-17 DIAGNOSIS — I129 Hypertensive chronic kidney disease with stage 1 through stage 4 chronic kidney disease, or unspecified chronic kidney disease: Secondary | ICD-10-CM | POA: Diagnosis not present

## 2016-10-17 DIAGNOSIS — E1122 Type 2 diabetes mellitus with diabetic chronic kidney disease: Secondary | ICD-10-CM | POA: Diagnosis not present

## 2016-10-17 DIAGNOSIS — R809 Proteinuria, unspecified: Secondary | ICD-10-CM | POA: Diagnosis not present

## 2016-12-10 ENCOUNTER — Encounter: Payer: Self-pay | Admitting: *Deleted

## 2016-12-11 ENCOUNTER — Ambulatory Visit
Admission: RE | Admit: 2016-12-11 | Discharge: 2016-12-11 | Disposition: A | Discharge status: Home / Self Care | Payer: PPO | Source: Ambulatory Visit | Attending: Gastroenterology | Admitting: Gastroenterology

## 2016-12-11 ENCOUNTER — Ambulatory Visit: Payer: PPO | Admitting: Certified Registered Nurse Anesthetist

## 2016-12-11 ENCOUNTER — Encounter
Admission: RE | Disposition: A | Discharge status: Home / Self Care | Payer: Self-pay | Source: Ambulatory Visit | Attending: Gastroenterology

## 2016-12-11 DIAGNOSIS — Z955 Presence of coronary angioplasty implant and graft: Secondary | ICD-10-CM | POA: Insufficient documentation

## 2016-12-11 DIAGNOSIS — K621 Rectal polyp: Secondary | ICD-10-CM | POA: Diagnosis not present

## 2016-12-11 DIAGNOSIS — Z9221 Personal history of antineoplastic chemotherapy: Secondary | ICD-10-CM | POA: Diagnosis not present

## 2016-12-11 DIAGNOSIS — Z91013 Allergy to seafood: Secondary | ICD-10-CM | POA: Insufficient documentation

## 2016-12-11 DIAGNOSIS — D123 Benign neoplasm of transverse colon: Secondary | ICD-10-CM | POA: Insufficient documentation

## 2016-12-11 DIAGNOSIS — I2510 Atherosclerotic heart disease of native coronary artery without angina pectoris: Secondary | ICD-10-CM | POA: Diagnosis not present

## 2016-12-11 DIAGNOSIS — D649 Anemia, unspecified: Secondary | ICD-10-CM | POA: Insufficient documentation

## 2016-12-11 DIAGNOSIS — Z1211 Encounter for screening for malignant neoplasm of colon: Secondary | ICD-10-CM | POA: Insufficient documentation

## 2016-12-11 DIAGNOSIS — Z980 Intestinal bypass and anastomosis status: Secondary | ICD-10-CM | POA: Insufficient documentation

## 2016-12-11 DIAGNOSIS — Z7902 Long term (current) use of antithrombotics/antiplatelets: Secondary | ICD-10-CM | POA: Insufficient documentation

## 2016-12-11 DIAGNOSIS — D125 Benign neoplasm of sigmoid colon: Secondary | ICD-10-CM | POA: Diagnosis not present

## 2016-12-11 DIAGNOSIS — I5022 Chronic systolic (congestive) heart failure: Secondary | ICD-10-CM | POA: Diagnosis not present

## 2016-12-11 DIAGNOSIS — I509 Heart failure, unspecified: Secondary | ICD-10-CM | POA: Diagnosis not present

## 2016-12-11 DIAGNOSIS — Z9049 Acquired absence of other specified parts of digestive tract: Secondary | ICD-10-CM | POA: Diagnosis not present

## 2016-12-11 DIAGNOSIS — I110 Hypertensive heart disease with heart failure: Secondary | ICD-10-CM | POA: Insufficient documentation

## 2016-12-11 DIAGNOSIS — D124 Benign neoplasm of descending colon: Secondary | ICD-10-CM | POA: Insufficient documentation

## 2016-12-11 DIAGNOSIS — Z7982 Long term (current) use of aspirin: Secondary | ICD-10-CM | POA: Insufficient documentation

## 2016-12-11 DIAGNOSIS — Z85038 Personal history of other malignant neoplasm of large intestine: Secondary | ICD-10-CM | POA: Diagnosis not present

## 2016-12-11 DIAGNOSIS — Z86010 Personal history of colonic polyps: Secondary | ICD-10-CM | POA: Diagnosis not present

## 2016-12-11 DIAGNOSIS — K5730 Diverticulosis of large intestine without perforation or abscess without bleeding: Secondary | ICD-10-CM | POA: Insufficient documentation

## 2016-12-11 DIAGNOSIS — D128 Benign neoplasm of rectum: Secondary | ICD-10-CM | POA: Diagnosis not present

## 2016-12-11 DIAGNOSIS — K5790 Diverticulosis of intestine, part unspecified, without perforation or abscess without bleeding: Secondary | ICD-10-CM | POA: Diagnosis not present

## 2016-12-11 DIAGNOSIS — K635 Polyp of colon: Secondary | ICD-10-CM | POA: Diagnosis not present

## 2016-12-11 DIAGNOSIS — I081 Rheumatic disorders of both mitral and tricuspid valves: Secondary | ICD-10-CM | POA: Insufficient documentation

## 2016-12-11 DIAGNOSIS — I428 Other cardiomyopathies: Secondary | ICD-10-CM | POA: Insufficient documentation

## 2016-12-11 DIAGNOSIS — I255 Ischemic cardiomyopathy: Secondary | ICD-10-CM | POA: Diagnosis not present

## 2016-12-11 DIAGNOSIS — Z79899 Other long term (current) drug therapy: Secondary | ICD-10-CM | POA: Insufficient documentation

## 2016-12-11 DIAGNOSIS — D122 Benign neoplasm of ascending colon: Secondary | ICD-10-CM | POA: Diagnosis not present

## 2016-12-11 DIAGNOSIS — D126 Benign neoplasm of colon, unspecified: Secondary | ICD-10-CM | POA: Diagnosis not present

## 2016-12-11 HISTORY — PX: COLONOSCOPY WITH PROPOFOL: SHX5780

## 2016-12-11 HISTORY — DX: Benign neoplasm of colon, unspecified: D12.6

## 2016-12-11 LAB — CBC WITH DIFFERENTIAL/PLATELET
Basophils Absolute: 0 10*3/uL (ref 0–0.1)
Basophils Relative: 1 %
EOS PCT: 6 %
Eosinophils Absolute: 0.2 10*3/uL (ref 0–0.7)
HEMATOCRIT: 37.6 % — AB (ref 40.0–52.0)
Hemoglobin: 12.8 g/dL — ABNORMAL LOW (ref 13.0–18.0)
LYMPHS ABS: 0.9 10*3/uL — AB (ref 1.0–3.6)
LYMPHS PCT: 25 %
MCH: 31.3 pg (ref 26.0–34.0)
MCHC: 34.1 g/dL (ref 32.0–36.0)
MCV: 91.6 fL (ref 80.0–100.0)
MONO ABS: 0.4 10*3/uL (ref 0.2–1.0)
MONOS PCT: 12 %
NEUTROS ABS: 1.9 10*3/uL (ref 1.4–6.5)
Neutrophils Relative %: 56 %
PLATELETS: 161 10*3/uL (ref 150–440)
RBC: 4.11 MIL/uL — ABNORMAL LOW (ref 4.40–5.90)
RDW: 14.5 % (ref 11.5–14.5)
WBC: 3.4 10*3/uL — ABNORMAL LOW (ref 3.8–10.6)

## 2016-12-11 LAB — PROTIME-INR
INR: 1.09
Prothrombin Time: 14 seconds (ref 11.4–15.2)

## 2016-12-11 SURGERY — COLONOSCOPY WITH PROPOFOL
Anesthesia: General

## 2016-12-11 MED ORDER — PROPOFOL 500 MG/50ML IV EMUL
INTRAVENOUS | Status: AC
  Filled 2016-12-11: qty 50

## 2016-12-11 MED ORDER — SODIUM CHLORIDE 0.9 % IV SOLN
INTRAVENOUS | Status: DC
  Administered 2016-12-11: 15:00:00 via INTRAVENOUS

## 2016-12-11 MED ORDER — PROPOFOL 500 MG/50ML IV EMUL
INTRAVENOUS | Status: DC | PRN
  Administered 2016-12-11: 120 ug/kg/min via INTRAVENOUS

## 2016-12-11 MED ORDER — PROPOFOL 10 MG/ML IV BOLUS
INTRAVENOUS | Status: AC
  Filled 2016-12-11: qty 20

## 2016-12-11 MED ORDER — SODIUM CHLORIDE 0.9 % IV SOLN: INTRAVENOUS | Status: DC

## 2016-12-11 MED ORDER — PROPOFOL 10 MG/ML IV BOLUS
INTRAVENOUS | Status: DC | PRN
  Administered 2016-12-11: 60 mg via INTRAVENOUS

## 2016-12-11 MED ORDER — PHENYLEPHRINE HCL 10 MG/ML IJ SOLN
INTRAMUSCULAR | Status: DC | PRN
  Administered 2016-12-11: 100 ug via INTRAVENOUS
  Administered 2016-12-11 (×10): 50 ug via INTRAVENOUS

## 2016-12-11 NOTE — Transfer of Care (Signed)
Immediate Anesthesia Transfer of Care Note  Patient: [name].  Procedure(s) Performed: COLONOSCOPY WITH PROPOFOL (N/A )  Patient Location: PACU  Anesthesia Type:General  Level of Consciousness: awake and alert   Airway & Oxygen Therapy: Patient Spontanous Breathing and Patient connected to face mask oxygen  Post-op Assessment: Report given to RN and Post -op Vital signs reviewed and stable  Post vital signs: Reviewed and stable  Last Vitals:  Vitals:   12/11/16 1431  BP: (!) 179/66  Pulse: 62  Temp: (!) 36.3 C  SpO2: 100%    Last Pain:  Vitals:   12/11/16 1431  TempSrc: Tympanic         Complications: No apparent anesthesia complications

## 2016-12-11 NOTE — Anesthesia Postprocedure Evaluation (Signed)
Anesthesia Post Note  Patient: [name].  Procedure(s) Performed: COLONOSCOPY WITH PROPOFOL (N/A )  Patient location during evaluation: Endoscopy Anesthesia Type: General Level of consciousness: awake and alert Pain management: pain level controlled Vital Signs Assessment: post-procedure vital signs reviewed and stable Respiratory status: spontaneous breathing, nonlabored ventilation, respiratory function stable and patient connected to nasal cannula oxygen Cardiovascular status: blood pressure returned to baseline and stable Postop Assessment: no apparent nausea or vomiting Anesthetic complications: no     Last Vitals:  Vitals:   12/11/16 1719 12/11/16 1729  BP: 132/67 (!) 157/63  Pulse: (!) 55 (!) 47  Resp: (!) 23 (!) 22  Temp:    SpO2: 100% 100%    Last Pain:  Vitals:   12/11/16 1709  TempSrc: Tympanic                 Martha Clan

## 2016-12-11 NOTE — Anesthesia Post-op Follow-up Note (Signed)
Anesthesia QCDR form completed.        

## 2016-12-11 NOTE — Op Note (Signed)
[facility]astroenterology Patient Name: [name] Procedure Date: 12/11/2016 4:02 PM MRN: [id number] Account #: [id number] Date of Birth: 01-Dec-1945 Admit Type: Outpatient Age: 71 Room: [facility] ENDO ROOM 3 Gender: Male Note Status: Finalized Procedure:            Colonoscopy Indications:          Personal history of malignant neoplasm of the colon,                        Personal history of colonic polyps Providers:            Lollie Sails, MD Referring MD:         Angela Adam. Caryl Bis (Referring MD) Medicines:            Monitored Anesthesia Care Complications:        No immediate complications. Procedure:            Pre-Anesthesia Assessment:                       - ASA Grade Assessment: III - A patient with severe                        systemic disease.                       After obtaining informed consent, the colonoscope was                        passed under direct vision. Throughout the procedure,                        the patient's blood pressure, pulse, and oxygen                        saturations were monitored continuously. The                        Colonoscope was introduced through the anus and                        advanced to the the cecum, identified by appendiceal                        orifice and ileocecal valve. The colonoscopy was                        unusually difficult due to poor bowel prep and a                        tortuous colon. Successful completion of the procedure                        was aided by lavage. The quality of the bowel                        preparation was good. Findings:      A few small-mouthed diverticula were found in the sigmoid colon and       descending colon.      There was evidence of a prior end-to-end colo-colonic anastomosis at 24  cm proximal to the anus. This was patent and was characterized by       healthy appearing mucosa. The anastomosis was traversed.      A 8 mm polyp was  found in the ascending colon. The polyp was sessile.       The polyp was removed with a cold snare. Resection and retrieval were       complete.      A 7 mm polyp was found in the proximal transverse colon. The polyp was       sessile. The polyp was removed with a cold snare. Resection and       retrieval were complete.      Two sessile polyps were found in the descending colon. The polyps were 2       to 3 mm in size. These polyps were removed with a cold biopsy forceps.       Resection and retrieval were complete.      A 6 mm polyp was found in the descending colon. The polyp was sessile.       The polyp was removed with a cold snare. Resection and retrieval were       complete.      A 6 mm polyp was found in the proximal sigmoid colon. The polyp was       sessile. The polyp was removed with a cold snare. Resection and       retrieval were complete.      Four sessile polyps were found in the rectum. The polyps were 2 to 5 mm       in size. These polyps were removed with a cold biopsy forceps. Resection       and retrieval were complete.      The digital rectal exam was normal. Impression:           - Diverticulosis in the sigmoid colon and in the                        descending colon.                       - Patent end-to-end colo-colonic anastomosis,                        characterized by healthy appearing mucosa.                       - One 8 mm polyp in the ascending colon, removed with a                        cold snare. Resected and retrieved.                       - One 7 mm polyp in the proximal transverse colon,                        removed with a cold snare. Resected and retrieved.                       - Two 2 to 3 mm polyps in the descending colon, removed                        with a cold biopsy forceps. Resected  and retrieved.                       - One 6 mm polyp in the descending colon, removed with                        a cold snare. Resected and retrieved.                        - One 6 mm polyp in the proximal sigmoid colon, removed                        with a cold snare. Resected and retrieved.                       - Four 2 to 5 mm polyps in the rectum, removed with a                        cold biopsy forceps. Resected and retrieved. Recommendation:       - Await pathology results.                       - Telephone GI clinic for pathology results in 1 week. Procedure Code(s):    --- Professional ---                       [phone], Colonoscopy, flexible; with removal of tumor(s),                        polyp(s), or other lesion(s) by snare technique                       [id number], 39, Colonoscopy, flexible; with biopsy, single                        or multiple Diagnosis Code(s):    --- Professional ---                       Z98.0, Intestinal bypass and anastomosis status                       D12.2, Benign neoplasm of ascending colon                       D12.3, Benign neoplasm of transverse colon (hepatic                        flexure or splenic flexure)                       D12.4, Benign neoplasm of descending colon                       D12.5, Benign neoplasm of sigmoid colon                       K62.1, Rectal polyp                       Z85.038, Personal history of other malignant neoplasm  of large intestine                       Z86.010, Personal history of colonic polyps                       K57.30, Diverticulosis of large intestine without                        perforation or abscess without bleeding CPT copyright 2016 American Medical Association. All rights reserved. The codes documented in this report are preliminary and upon coder review may  be revised to meet current compliance requirements. Lollie Sails, MD 12/11/2016 5:11:08 PM This report has been signed electronically. Number of Addenda: 0 Note Initiated On: 12/11/2016 4:02 PM Scope Withdrawal Time: 0 hours 39 minutes 48 seconds  Total  Procedure Duration: 0 hours 52 minutes 59 seconds       [facility]

## 2016-12-11 NOTE — Anesthesia Preprocedure Evaluation (Signed)
Anesthesia Evaluation  Patient identified by MRN, date of birth, ID band Patient awake    Reviewed: Allergy & Precautions, H&P , NPO status , Patient's Chart, lab work & pertinent test results, reviewed documented beta blocker date and time   History of Anesthesia Complications Negative for: history of anesthetic complications  Airway Mallampati: III  TM Distance: >3 FB Neck ROM: full    Dental  (+) Poor Dentition, Missing, Chipped, Dental Advidsory Given   Pulmonary neg pulmonary ROS,           Cardiovascular Exercise Tolerance: Good hypertension, (-) angina+ CAD, + Cardiac Stents and +CHF  (-) Past MI and (-) CABG (-) dysrhythmias + Valvular Problems/Murmurs      Neuro/Psych negative neurological ROS  negative psych ROS   GI/Hepatic negative GI ROS, Neg liver ROS,   Endo/Other  negative endocrine ROS  Renal/GU negative Renal ROS  negative genitourinary   Musculoskeletal   Abdominal   Peds  Hematology negative hematology ROS (+)   Anesthesia Other Findings Past Medical History: No date: Anemia No date: CAD (coronary artery disease)     Comment:  a. 02/2015 Cath: LM nl, LAD 50/40 mid/distal, LCX 80p,               RCA 40p, 30d, RPL1 40, CO 3.27, CI 1.65; b. cath 06/09/15:               LM minor irregs, m-dLAD 50%, dLAD 40%, pLCx 80% s/p               PCI/DES 0%, pRCA 40%, dRCA 30%, 1st RPLB 40% No date: Chronic systolic CHF (congestive heart failure) ([facility])     Comment:  a. 02/2015 Echo: EF 20-25%, diff HK, mod MR, mildy dil               LA, mildly dil RV w/ mod RV syst dysfxn, mildly dil RA,               mod TR, PASP 55mmHg. No date: Colon cancer [facility])     Comment:  a. 2015 s/p colectomy followed by chemoRx with               oxaliplatin & fluorouacil. No date: Hypertension No date: Hypertensive heart disease No date: Mixed Ischemic and Non-ischemic Cardiomyopathy     Comment:  a. 02/2015 Echo: EF 20-25%, diff  HK. No date: Moderate mitral regurgitation No date: Moderate tricuspid regurgitation No date: Tubular adenoma of colon   Reproductive/Obstetrics negative OB ROS                             Anesthesia Physical Anesthesia Plan  ASA: III  Anesthesia Plan: General   Post-op Pain Management:    Induction: Intravenous  PONV Risk Score and Plan: 2 and Propofol infusion  Airway Management Planned: Nasal Cannula  Additional Equipment:   Intra-op Plan:   Post-operative Plan:   Informed Consent: I have reviewed the patients History and Physical, chart, labs and discussed the procedure including the risks, benefits and alternatives for the proposed anesthesia with the patient or authorized representative who has indicated his/her understanding and acceptance.   Dental Advisory Given  Plan Discussed with: Anesthesiologist, CRNA and Surgeon  Anesthesia Plan Comments:         Anesthesia Quick Evaluation

## 2016-12-11 NOTE — H&P (Signed)
Outpatient short stay form Pre-procedure 12/11/2016 4:01 PM Christopher Sails MD  Primary Physician: Arna Medici M.D.  Reason for visit:  Colonoscopy  History of present illness:  Patient is a 71 year old male presenting today as above. He has a personal history of colon cancer diagnosed in 2015. His last colonoscopy was 04/12/2014. He apparently had a sigmoid colectomy. I've been unable find records on this. He also underwent 12 rounds of chemotherapy. He overall has been doing well. His last CEA was in December 2017 that being normal.  He tolerated his prep well. He takes no blood thinning agents with the exception of 81 mg aspirin. He does have a history of a cardiac stent placed about a year and a half ago. He has been off Plavix now for about 3 months.    Current Facility-Administered Medications:  .  0.9 %  sodium chloride infusion, , Intravenous, Continuous, Christopher Sails, MD, Last Rate: 20 mL/hr at 12/11/16 1517 .  0.9 %  sodium chloride infusion, , Intravenous, Continuous, Christopher Sails, MD  Medications Prior to Admission  Medication Sig Dispense Refill Last Dose  . amLODipine (NORVASC) 10 MG tablet Take 1 tablet (10 mg total) by mouth daily. 90 tablet 2 12/10/2016 at Unknown time  . aspirin EC 81 MG tablet Take 1 tablet (81 mg total) by mouth daily. 90 tablet 3 12/10/2016 at Unknown time  . atorvastatin (LIPITOR) 40 MG tablet Take 1 tablet (40 mg total) by mouth daily. 90 tablet 2 12/10/2016 at Unknown time  . carvedilol (COREG) 3.125 MG tablet Take 1 tablet (3.125 mg total) by mouth 2 (two) times daily. 180 tablet 2 12/10/2016 at Unknown time  . furosemide (LASIX) 40 MG tablet Take 1 tablet (40 mg total) by mouth daily. 90 tablet 2 12/10/2016 at Unknown time  . gabapentin (NEURONTIN) 100 MG capsule Take 2 capsules (200 mg total) by mouth 3 (three) times daily. (Patient taking differently: Take 100 mg by mouth 3 (three) times daily. ) 180 capsule 0 12/10/2016 at Unknown  time  . lisinopril (PRINIVIL,ZESTRIL) 40 MG tablet Take 1 tablet (40 mg total) by mouth daily. 90 tablet 2 12/10/2016 at Unknown time  . omeprazole (PRILOSEC) 20 MG capsule Take 20 mg by mouth daily.   12/10/2016 at Unknown time  . spironolactone (ALDACTONE) 25 MG tablet Take 1 tablet (25 mg total) by mouth daily. 90 tablet 2 12/10/2016 at Unknown time     Allergies  Allergen Reactions  . Shellfish Allergy Other (See Comments)    Pt. instructed by MD to avoid seafood     Past Medical History:  Diagnosis Date  . Anemia   . CAD (coronary artery disease)    a. 02/2015 Cath: LM nl, LAD 50/40 mid/distal, LCX 80p, RCA 40p, 30d, RPL1 40, CO 3.27, CI 1.65; b. cath 06/09/15: LM minor irregs, m-dLAD 50%, dLAD 40%, pLCx 80% s/p PCI/DES 0%, pRCA 40%, dRCA 30%, 1st RPLB 40%  . Chronic systolic CHF (congestive heart failure) ([place])    a. 02/2015 Echo: EF 20-25%, diff HK, mod MR, mildy dil LA, mildly dil RV w/ mod RV syst dysfxn, mildly dil RA, mod TR, PASP 11mmHg.  . Colon cancer ([place])    a. 2015 s/p colectomy followed by chemoRx with oxaliplatin & fluorouacil.  Marland Kitchen Hypertension   . Hypertensive heart disease   . Mixed Ischemic and Non-ischemic Cardiomyopathy    a. 02/2015 Echo: EF 20-25%, diff HK.  . Moderate mitral regurgitation   . Moderate tricuspid  regurgitation   . Tubular adenoma of colon     Review of systems:      Physical Exam    Heart and lungs: Regular rate and rhythm without rub or gallop, lungs are bilaterally clear.    HEENT: Normocephalic atraumatic eyes are anicteric    Other:     Pertinant exam for procedure: Colonoscopy and indicated procedures. I have discussed the risks benefits and complications of procedures to include not limited to bleeding, infection, perforation and the risk of sedation and the patient wishes to proceed.    Planned proceedures: colonoscopy and indicated procedures. I have discussed the risks benefits and complications of procedures to include not  limited to bleeding, infection, perforation and the risk of sedation and the patient wishes to proceed.    Christopher Sails, MD Gastroenterology 12/11/2016  4:01 PM

## 2016-12-12 ENCOUNTER — Encounter: Payer: Self-pay | Admitting: Gastroenterology

## 2016-12-14 LAB — SURGICAL PATHOLOGY

## 2017-01-09 ENCOUNTER — Inpatient Hospital Stay: Payer: PPO | Attending: Hematology and Oncology

## 2017-01-09 DIAGNOSIS — Z85038 Personal history of other malignant neoplasm of large intestine: Secondary | ICD-10-CM | POA: Insufficient documentation

## 2017-01-09 DIAGNOSIS — D508 Other iron deficiency anemias: Secondary | ICD-10-CM

## 2017-01-09 LAB — CBC WITH DIFFERENTIAL/PLATELET
Basophils Absolute: 0 10*3/uL (ref 0–0.1)
Basophils Relative: 1 %
Eosinophils Absolute: 0.1 10*3/uL (ref 0–0.7)
Eosinophils Relative: 5 %
HCT: 34.4 % — ABNORMAL LOW (ref 40.0–52.0)
Hemoglobin: 12 g/dL — ABNORMAL LOW (ref 13.0–18.0)
Lymphocytes Relative: 22 %
Lymphs Abs: 0.6 10*3/uL — ABNORMAL LOW (ref 1.0–3.6)
MCH: 31.8 pg (ref 26.0–34.0)
MCHC: 34.8 g/dL (ref 32.0–36.0)
MCV: 91.6 fL (ref 80.0–100.0)
Monocytes Absolute: 0.3 10*3/uL (ref 0.2–1.0)
Monocytes Relative: 12 %
Neutro Abs: 1.7 10*3/uL (ref 1.4–6.5)
Neutrophils Relative %: 60 %
Platelets: 133 10*3/uL — ABNORMAL LOW (ref 150–440)
RBC: 3.76 MIL/uL — ABNORMAL LOW (ref 4.40–5.90)
RDW: 15 % — ABNORMAL HIGH (ref 11.5–14.5)
WBC: 2.9 10*3/uL — ABNORMAL LOW (ref 3.8–10.6)

## 2017-01-09 LAB — COMPREHENSIVE METABOLIC PANEL
ALT: 20 U/L (ref 17–63)
AST: 26 U/L (ref 15–41)
Albumin: 3.9 g/dL (ref 3.5–5.0)
Alkaline Phosphatase: 84 U/L (ref 38–126)
Anion gap: 10 (ref 5–15)
BUN: 15 mg/dL (ref 6–20)
CO2: 26 mmol/L (ref 22–32)
Calcium: 8.7 mg/dL — ABNORMAL LOW (ref 8.9–10.3)
Chloride: 105 mmol/L (ref 101–111)
Creatinine, Ser: 1.31 mg/dL — ABNORMAL HIGH (ref 0.61–1.24)
GFR calc Af Amer: >60 mL/min (ref >60)
GFR calc non Af Amer: 54 mL/min — ABNORMAL LOW (ref >60)
Glucose, Bld: 181 mg/dL — ABNORMAL HIGH (ref 65–99)
Potassium: 3.3 mmol/L — ABNORMAL LOW (ref 3.5–5.1)
Sodium: 141 mmol/L (ref 135–145)
Total Bilirubin: 1 mg/dL (ref 0.3–1.2)
Total Protein: 6.9 g/dL (ref 6.5–8.1)

## 2017-01-10 LAB — CEA: CEA: 1.2 ng/mL (ref 0.0–4.7)

## 2017-01-11 ENCOUNTER — Ambulatory Visit: Admission: RE | Admit: 2017-01-11 | Payer: PPO | Source: Ambulatory Visit

## 2017-01-14 ENCOUNTER — Inpatient Hospital Stay: Payer: PPO | Admitting: Hematology and Oncology

## 2017-01-17 ENCOUNTER — Telehealth: Payer: Self-pay | Admitting: *Deleted

## 2017-01-17 NOTE — Telephone Encounter (Signed)
Called patient to Rescheduled his 01/11/17 CT and his 01/14/17 MD appt Both appt were Rescheduled. Left message on patient vmail and also letter was mailed out.

## 2017-01-25 ENCOUNTER — Ambulatory Visit: Admission: RE | Admit: 2017-01-25 | Payer: PPO | Source: Ambulatory Visit

## 2017-02-04 ENCOUNTER — Inpatient Hospital Stay: Payer: PPO | Admitting: Hematology and Oncology

## 2017-02-04 NOTE — Progress Notes (Deleted)
[facility] day:  02/04/17   Chief Complaint: [name]. is a 71 y.o. male with a history of stage IIIC colon cancer who is seen for 6 month assessment.  HPI: The patient was last seen in the medical oncology clinic on 08/24/2016.  At that time, he felt good.  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 He was on Neurontin.  Exam was unremarkable.  Creatinine was 1.61 (baseline 1.25 - 1.38).  CEA was 1.3.  Colonoscopy on 12/11/2016 by Dr. Gustavo Lah revealed 10 small polyps (2-8 mm) in the ascending colon, transverse colon, , descending colon, proximal sigmoid colon, and rectum.  Pathology revealed tubular adenomas (6) and hyperplastic polyps (4) with no high grade dysplasia or malignancy.  He has not had his yearly surveillance CT scan.  During the interim,   Past Medical History:  Diagnosis Date  . Anemia   . CAD (coronary artery disease)    a. 02/2015 Cath: LM nl, LAD 50/40 mid/distal, LCX 80p, RCA 40p, 30d, RPL1 40, CO 3.27, CI 1.65; b. cath 06/09/15: LM minor irregs, m-dLAD 50%, dLAD 40%, pLCx 80% s/p PCI/DES 0%, pRCA 40%, dRCA 30%, 1st RPLB 40%  . Chronic systolic CHF (congestive heart failure) ([place])    a. 02/2015 Echo: EF 20-25%, diff HK, mod MR, mildy dil LA, mildly dil RV w/ mod RV syst dysfxn, mildly dil RA, mod TR, PASP 20mHg.  . [place]    a. 2015 s/p colectomy followed by chemoRx with oxaliplatin & fluorouacil.  .Marland KitchenHypertension   . Hypertensive heart disease   . Mixed Ischemic and Non-ischemic Cardiomyopathy    a. 02/2015 Echo: EF 20-25%, diff HK.  . Moderate mitral regurgitation   . Moderate tricuspid regurgitation   . Tubular adenoma of colon     Past Surgical History:  Procedure Laterality Date  . CARDIAC CATHETERIZATION Bilateral 02/24/2015   Procedure: Right/Left Heart Cath and Coronary Angiography;  Surgeon: MWellington Hampshire MD;  [facility];  Service: Cardiovascular;   Laterality: Bilateral;  . CARDIAC CATHETERIZATION N/A 06/09/2015   Procedure: Coronary Stent Intervention;  Surgeon: MWellington Hampshire MD;  [facility];  Service: Cardiovascular;  Laterality: N/A;  . COLON SURGERY     Colectomy  . COLONOSCOPY    . COLONOSCOPY WITH PROPOFOL N/A 12/11/2016   Procedure: COLONOSCOPY WITH PROPOFOL;  Surgeon: SLollie Sails MD;  [facility];  Service: Endoscopy;  Laterality: N/A;  . ESOPHAGOGASTRODUODENOSCOPY    . PORT-A-CATH REMOVAL N/A 07/12/2014   Procedure: REMOVAL PORT-A-CATH;  Surgeon: WMolly Maduro MD;  Location: [facility];  Service: General;  Laterality: N/A;  . PORTACATH PLACEMENT Left     Family History  Family history unknown: Yes    Social History:  reports that  has never smoked. he has never used smokeless tobacco. He reports that he does not drink alcohol or use drugs.  He has [place] from the city.  [place]  He is accompanied by a woman today.  Allergies:  Allergies  Allergen Reactions  . Shellfish Allergy Other (See Comments)    Pt. instructed by MD to avoid seafood    Current Medications: Current Outpatient Medications  Medication Sig Dispense Refill  . amLODipine (NORVASC) 10 MG tablet Take 1 tablet (10 mg total) by mouth daily. 90 tablet 2  . aspirin EC 81 MG tablet Take 1 tablet (81 mg total) by mouth daily.  90 tablet 3  . atorvastatin (LIPITOR) 40 MG tablet Take 1 tablet (40 mg total) by mouth daily. 90 tablet 2  . carvedilol (COREG) 3.125 MG tablet Take 1 tablet (3.125 mg total) by mouth 2 (two) times daily. 180 tablet 2  . furosemide (LASIX) 40 MG tablet Take 1 tablet (40 mg total) by mouth daily. 90 tablet 2  . gabapentin (NEURONTIN) 100 MG capsule Take 2 capsules (200 mg total) by mouth 3 (three) times daily. (Patient taking differently: Take 100 mg by mouth 3 (three) times daily. ) 180 capsule 0  . lisinopril (PRINIVIL,ZESTRIL) 40 MG tablet Take 1 tablet (40 mg total)  by mouth daily. 90 tablet 2  . omeprazole (PRILOSEC) 20 MG capsule Take 20 mg by mouth daily.    Marland Kitchen spironolactone (ALDACTONE) 25 MG tablet Take 1 tablet (25 mg total) by mouth daily. 90 tablet 2   No current facility-administered medications for this visit.     Review of Systems:  GENERAL:  Feels good. No fevers or sweats.  Weight stable. PERFORMANCE STATUS (ECOG):  0 HEENT:  No visual changes, runny nose, sore throat, mouth sores or tenderness.  Needs teeth. Lungs: No shortness of breath or cough.  No hemoptysis. Cardiac:  No chest pain, palpitations, orthopnea, or PND. GI:  No nausea, vomiting, diarrhea, constipation, melena or hematochezia. GU:  No urgency, frequency, dysuria, or hematuria. Musculoskeletal:  No back pain.  No joint pain.  No muscle tenderness. Extremities:  No pain or swelling. Skin:  No rashes or skin changes. Neuro:  Neuropathy in fingers and toes, stable (on Neurontin).  No headache, numbness or weakness, balance or coordination issues. Endocrine:  No diabetes, thyroid issues, hot flashes or night sweats. Psych:  No mood changes, depression or anxiety. Pain:  No focal pain. Review of systems:  All other systems reviewed and found to be negative.  Physical Exam: There were no vitals taken for this visit. GENERAL:  Well developed, well nourished, gentleman sitting comfortably in the exam room in no acute distress. MENTAL STATUS:  Alert and oriented to person, place and time. HEAD:  Near alopecia.  Normocephalic, atraumatic, face symmetric, no Cushingoid features. EYES:  Brown eyes.  Pupils equal round and reactive to light and accomodation.  No conjunctivitis or scleral icterus. ENT:  Oropharynx clear without lesion.  Poor dentition.  Tongue normal.  Mucous membranes moist.  RESPIRATORY:  Clear to auscultation without rales, wheezes or rhonchi. CARDIOVASCULAR:  Regular rate and rhythm without murmur, rub or gallop. No JVD. ABDOMEN:  Soft, non-tender, with active  bowel sounds, and no hepatosplenomegaly.  No masses. SKIN:  No rashes, ulcers or lesions. EXTREMITIES:  No edema, no skin discoloration or tenderness.  No palpable cords. LYMPH NODES: No palpable cervical, supraclavicular, axillary or inguinal adenopathy  NEUROLOGICAL: Unremarkable. PSYCH:  Appropriate.  Imaging studies: 01/11/2014:  Chest, abdomen, and pelvic CT revealed no evidence of metastatic disease. He was noted to have stable splenomegaly. There was some progression of tree-in-bud changes in the lungs, questionable MAC infection. There were chronic inflammatory changes. 01/12/2015:  Chest, abdomen, and pelvic CT revealed no evidence of metastatic disease in the chest, abdomen or pelvis.  There was a solitary 4 mm right upper lobe pulmonary nodule (stable). There was mild cardiomegaly (increased).  There was mild patchy ground-glass opacity and interlobular septal thickening throughout both lungs, suggestive of pulmonary edema, concerning for developing congestive heart failure.  There was stable dilated main pulmonary artery, suggesting pulmonary arterial hypertension.  There was  left main and 3 vessel coronary atherosclerosis.   01/12/2016:  Chest, abdomen, and pelvic CT revealed no evidence of recurrent or metastatic carcinoma.  There was cholelithiasis without radiographic evidence of cholecystitis.  There was aortic and coronary artery atherosclerosis.   No visits with results within 3 Day(s) from this visit.  Latest known visit with results is:  Appointment on 01/09/2017  Component Date Value Ref Range Status  . CEA 01/09/2017 1.2  0.0 - 4.7 ng/mL Final   Comment: (NOTE)       Roche ECLIA methodology       Nonsmokers  <3.9                                     Smokers     <5.6 Performed At: [facility] [place], [place] [id number] Rush Farmer MD [id number]   . Sodium 01/09/2017 141  135 - 145 mmol/L Final  . Potassium 01/09/2017 3.3* 3.5 - 5.1 mmol/L  Final  . Chloride 01/09/2017 105  101 - 111 mmol/L Final  . CO2 01/09/2017 26  22 - 32 mmol/L Final  . Glucose, Bld 01/09/2017 181* 65 - 99 mg/dL Final  . BUN 01/09/2017 15  6 - 20 mg/dL Final  . Creatinine, Ser 01/09/2017 1.31* 0.61 - 1.24 mg/dL Final  . Calcium 01/09/2017 8.7* 8.9 - 10.3 mg/dL Final  . Total Protein 01/09/2017 6.9  6.5 - 8.1 g/dL Final  . Albumin 01/09/2017 3.9  3.5 - 5.0 g/dL Final  . AST 01/09/2017 26  15 - 41 U/L Final  . ALT 01/09/2017 20  17 - 63 U/L Final  . Alkaline Phosphatase 01/09/2017 84  38 - 126 U/L Final  . Total Bilirubin 01/09/2017 1.0  0.3 - 1.2 mg/dL Final  . GFR calc non Af Amer 01/09/2017 54* >60 mL/min Final  . GFR calc Af Amer 01/09/2017 >60  >60 mL/min Final   Comment: (NOTE) The eGFR has been calculated using the CKD EPI equation. This calculation has not been validated in all clinical situations. eGFR's persistently <60 mL/min signify possible Chronic Kidney Disease.   . Anion gap 01/09/2017 10  5 - 15 Final  . WBC 01/09/2017 2.9* 3.8 - 10.6 K/uL Final  . RBC 01/09/2017 3.76* 4.40 - 5.90 MIL/uL Final  . Hemoglobin 01/09/2017 12.0* 13.0 - 18.0 g/dL Final  . HCT 01/09/2017 34.4* 40.0 - 52.0 % Final  . MCV 01/09/2017 91.6  80.0 - 100.0 fL Final  . MCH 01/09/2017 31.8  26.0 - 34.0 pg Final  . MCHC 01/09/2017 34.8  32.0 - 36.0 g/dL Final  . RDW 01/09/2017 15.0* 11.5 - 14.5 % Final  . Platelets 01/09/2017 133* 150 - 440 K/uL Final  . Neutrophils Relative % 01/09/2017 60  % Final  . Neutro Abs 01/09/2017 1.7  1.4 - 6.5 K/uL Final  . Lymphocytes Relative 01/09/2017 22  % Final  . Lymphs Abs 01/09/2017 0.6* 1.0 - 3.6 K/uL Final  . Monocytes Relative 01/09/2017 12  % Final  . Monocytes Absolute 01/09/2017 0.3  0.2 - 1.0 K/uL Final  . Eosinophils Relative 01/09/2017 5  % Final  . Eosinophils Absolute 01/09/2017 0.1  0 - 0.7 K/uL Final  . Basophils Relative 01/09/2017 1  % Final  . Basophils Absolute 01/09/2017 0.0  0 - 0.1 K/uL Final     Assessment:  Koah Chisenhall. is a 71 y.o. male  with stage IIIC colon cancer. He presented with symptomatic anemia.  He underwent descending and proximal sigmoid colectomy, left ureteral lysis, and partial cecectomy on 06/23/2013. Six of 22 lymph nodes were positive.  There were tumor deposits (discontinuous extramural extension).  There was lymphovascular and perineural invasion. Pathologic stage was IIIC (T4bN2a M0).  He received FOLFOX chemotherapy from 07/22/2013 until 01/06/2014.    Chest, abdomen, and pelvic CT on 01/12/2016 revealed no evidence of recurrent or metastatic carcinoma.   CEA was 4.3 on 06/20/2013, 1.8 on 01/06/2014, 1.5 on 09/16/2014, 1.1 on 01/14/2015, 1.5 on 05/16/2015, 1.6 on 09/12/2015, and 1.3 on 01/20/2016.  Colonoscopy on 04/12/2014 revealed several polyps.  There was no dysplasia or malignancy. Lower extremity duplex on 06/15/2014 revealed no evidence of DVT.  He has a mild normocytic anemia.  Diet is good.  He denies any melena or hematochezia.  Echocardiogram revealed an EF was 20-25% on 02/18/2015.  Cardiac catheterization on 02/24/2015 revealed 80% occlusion of the proximal left circumflex.  He is being managed medically.  Symptomatically, he feels good.  He has a stable residual neuropathy secondary to oxaliplatin. He is on Neurontin.  Exam is stable.  Creatinine is 1.61 (baseline Cr 1.25 - 1.38).  Plan: 1.  Labs today: CBC with diff, CMP, CEA.  2.  Discuss interval colonoscopy with multiple small polyps.  2.  Discus increase in creatinine.  Discuss follow-up with Dr. Caryl Bis and Dr. Juleen China.  Discuss checking creatinine prior to yearly CT in 01/2017.  If creatinine remains elevated, scans will be performed without contrast. 3.  Labs 2 days prior to scans:  CBC with diff, CMP, CEA. 4.  Chest, abdomen, pelvic CT scan on 01/11/2017. 5.  Colonoscopy scheduled for 12/11/2016. 6.  RTC in 6 months for MD assessment, review of labs and imaging.   Lequita Asal, MD  02/04/17, 5:33 AM

## 2017-04-25 DIAGNOSIS — R809 Proteinuria, unspecified: Secondary | ICD-10-CM | POA: Diagnosis not present

## 2017-04-25 DIAGNOSIS — N183 Chronic kidney disease, stage 3 (moderate): Secondary | ICD-10-CM | POA: Diagnosis not present

## 2017-04-25 DIAGNOSIS — E1122 Type 2 diabetes mellitus with diabetic chronic kidney disease: Secondary | ICD-10-CM | POA: Diagnosis not present

## 2017-04-25 DIAGNOSIS — I509 Heart failure, unspecified: Secondary | ICD-10-CM | POA: Diagnosis not present

## 2017-04-25 DIAGNOSIS — I129 Hypertensive chronic kidney disease with stage 1 through stage 4 chronic kidney disease, or unspecified chronic kidney disease: Secondary | ICD-10-CM | POA: Diagnosis not present

## 2017-05-16 ENCOUNTER — Other Ambulatory Visit: Payer: Self-pay | Admitting: Cardiovascular Disease

## 2017-06-19 ENCOUNTER — Other Ambulatory Visit: Payer: Self-pay | Admitting: Cardiovascular Disease

## 2017-06-19 HISTORY — PX: APPENDECTOMY: SHX54

## 2017-06-19 HISTORY — PX: COLECTOMY: SHX59

## 2017-06-20 ENCOUNTER — Telehealth: Payer: Self-pay

## 2017-06-20 NOTE — Telephone Encounter (Signed)
l mom to call and schedule past due f/u

## 2017-06-20 NOTE — Telephone Encounter (Signed)
-----   Message from Anselm Pancoast, [place] sent at 06/19/2017  3:46 PM EDT ----- Please contact patient for a follow up appointment.  The patient was to follow up with Dr. Fletcher Anon in 2018 but last seen in 2017.  Thanks, Ivin Booty

## 2017-07-16 ENCOUNTER — Other Ambulatory Visit: Payer: Self-pay | Admitting: Cardiovascular Disease

## 2017-08-14 ENCOUNTER — Emergency Department: Payer: PPO

## 2017-08-14 ENCOUNTER — Inpatient Hospital Stay
Admission: EM | Admit: 2017-08-14 | Discharge: 2017-08-17 | DRG: 683 | Disposition: A | Discharge status: Home / Self Care | Payer: PPO | Attending: Internal Medicine | Admitting: Internal Medicine

## 2017-08-14 ENCOUNTER — Inpatient Hospital Stay: Payer: PPO

## 2017-08-14 ENCOUNTER — Other Ambulatory Visit: Payer: Self-pay

## 2017-08-14 DIAGNOSIS — Z955 Presence of coronary angioplasty implant and graft: Secondary | ICD-10-CM

## 2017-08-14 DIAGNOSIS — N3941 Urge incontinence: Secondary | ICD-10-CM | POA: Diagnosis present

## 2017-08-14 DIAGNOSIS — N179 Acute kidney failure, unspecified: Secondary | ICD-10-CM

## 2017-08-14 DIAGNOSIS — Z85038 Personal history of other malignant neoplasm of large intestine: Secondary | ICD-10-CM

## 2017-08-14 DIAGNOSIS — N390 Urinary tract infection, site not specified: Secondary | ICD-10-CM | POA: Diagnosis not present

## 2017-08-14 DIAGNOSIS — R739 Hyperglycemia, unspecified: Secondary | ICD-10-CM | POA: Diagnosis not present

## 2017-08-14 DIAGNOSIS — Z79899 Other long term (current) drug therapy: Secondary | ICD-10-CM

## 2017-08-14 DIAGNOSIS — N183 Chronic kidney disease, stage 3 (moderate): Secondary | ICD-10-CM | POA: Diagnosis not present

## 2017-08-14 DIAGNOSIS — R531 Weakness: Secondary | ICD-10-CM

## 2017-08-14 DIAGNOSIS — Z91013 Allergy to seafood: Secondary | ICD-10-CM | POA: Diagnosis not present

## 2017-08-14 DIAGNOSIS — N1831 Chronic kidney disease, stage 3a: Secondary | ICD-10-CM | POA: Diagnosis present

## 2017-08-14 DIAGNOSIS — N136 Pyonephrosis: Secondary | ICD-10-CM | POA: Diagnosis present

## 2017-08-14 DIAGNOSIS — K219 Gastro-esophageal reflux disease without esophagitis: Secondary | ICD-10-CM | POA: Diagnosis present

## 2017-08-14 DIAGNOSIS — I255 Ischemic cardiomyopathy: Secondary | ICD-10-CM | POA: Diagnosis not present

## 2017-08-14 DIAGNOSIS — Z9221 Personal history of antineoplastic chemotherapy: Secondary | ICD-10-CM

## 2017-08-14 DIAGNOSIS — G629 Polyneuropathy, unspecified: Secondary | ICD-10-CM | POA: Diagnosis present

## 2017-08-14 DIAGNOSIS — D631 Anemia in chronic kidney disease: Secondary | ICD-10-CM | POA: Diagnosis present

## 2017-08-14 DIAGNOSIS — N2889 Other specified disorders of kidney and ureter: Secondary | ICD-10-CM | POA: Diagnosis present

## 2017-08-14 DIAGNOSIS — K625 Hemorrhage of anus and rectum: Secondary | ICD-10-CM | POA: Diagnosis not present

## 2017-08-14 DIAGNOSIS — N401 Enlarged prostate with lower urinary tract symptoms: Secondary | ICD-10-CM | POA: Diagnosis not present

## 2017-08-14 DIAGNOSIS — Z951 Presence of aortocoronary bypass graft: Secondary | ICD-10-CM

## 2017-08-14 DIAGNOSIS — E785 Hyperlipidemia, unspecified: Secondary | ICD-10-CM | POA: Diagnosis present

## 2017-08-14 DIAGNOSIS — R1032 Left lower quadrant pain: Secondary | ICD-10-CM | POA: Diagnosis not present

## 2017-08-14 DIAGNOSIS — I2510 Atherosclerotic heart disease of native coronary artery without angina pectoris: Secondary | ICD-10-CM | POA: Diagnosis not present

## 2017-08-14 DIAGNOSIS — N12 Tubulo-interstitial nephritis, not specified as acute or chronic: Secondary | ICD-10-CM | POA: Diagnosis not present

## 2017-08-14 DIAGNOSIS — I5022 Chronic systolic (congestive) heart failure: Secondary | ICD-10-CM | POA: Diagnosis present

## 2017-08-14 DIAGNOSIS — I081 Rheumatic disorders of both mitral and tricuspid valves: Secondary | ICD-10-CM | POA: Diagnosis not present

## 2017-08-14 DIAGNOSIS — N1330 Unspecified hydronephrosis: Secondary | ICD-10-CM | POA: Diagnosis not present

## 2017-08-14 DIAGNOSIS — E860 Dehydration: Secondary | ICD-10-CM | POA: Diagnosis not present

## 2017-08-14 DIAGNOSIS — I130 Hypertensive heart and chronic kidney disease with heart failure and stage 1 through stage 4 chronic kidney disease, or unspecified chronic kidney disease: Secondary | ICD-10-CM | POA: Diagnosis not present

## 2017-08-14 DIAGNOSIS — Z7982 Long term (current) use of aspirin: Secondary | ICD-10-CM | POA: Diagnosis not present

## 2017-08-14 DIAGNOSIS — Z9049 Acquired absence of other specified parts of digestive tract: Secondary | ICD-10-CM | POA: Diagnosis not present

## 2017-08-14 DIAGNOSIS — I10 Essential (primary) hypertension: Secondary | ICD-10-CM | POA: Diagnosis not present

## 2017-08-14 HISTORY — DX: Acute kidney failure, unspecified: N17.9

## 2017-08-14 LAB — COMPREHENSIVE METABOLIC PANEL
ALT: 41 U/L (ref 0–44)
AST: 35 U/L (ref 15–41)
Albumin: 2.9 g/dL — ABNORMAL LOW (ref 3.5–5.0)
Alkaline Phosphatase: 101 U/L (ref 38–126)
Anion gap: 12 (ref 5–15)
BUN: 45 mg/dL — ABNORMAL HIGH (ref 8–23)
CHLORIDE: 103 mmol/L (ref 98–111)
CO2: 22 mmol/L (ref 22–32)
Calcium: 8.6 mg/dL — ABNORMAL LOW (ref 8.9–10.3)
Creatinine, Ser: 3.16 mg/dL — ABNORMAL HIGH (ref 0.61–1.24)
GFR, EST AFRICAN AMERICAN: 21 mL/min — AB (ref >60)
GFR, EST NON AFRICAN AMERICAN: 18 mL/min — AB (ref >60)
Glucose, Bld: 224 mg/dL — ABNORMAL HIGH (ref 70–99)
POTASSIUM: 4.8 mmol/L (ref 3.5–5.1)
Sodium: 137 mmol/L (ref 135–145)
Total Bilirubin: 2 mg/dL — ABNORMAL HIGH (ref 0.3–1.2)
Total Protein: 8.1 g/dL (ref 6.5–8.1)

## 2017-08-14 LAB — URINALYSIS, COMPLETE (UACMP) WITH MICROSCOPIC
BILIRUBIN URINE: NEGATIVE
Bacteria, UA: NONE SEEN
Glucose, UA: NEGATIVE mg/dL
Ketones, ur: 5 mg/dL — AB
NITRITE: NEGATIVE
PROTEIN: 30 mg/dL — AB
Specific Gravity, Urine: 1.017 (ref 1.005–1.030)
pH: 5 (ref 5.0–8.0)

## 2017-08-14 LAB — CBC
HCT: 33.1 % — ABNORMAL LOW (ref 40.0–52.0)
HEMOGLOBIN: 11.2 g/dL — AB (ref 13.0–18.0)
MCH: 31.9 pg (ref 26.0–34.0)
MCHC: 33.7 g/dL (ref 32.0–36.0)
MCV: 94.4 fL (ref 80.0–100.0)
PLATELETS: 159 10*3/uL (ref 150–440)
RBC: 3.51 MIL/uL — AB (ref 4.40–5.90)
RDW: 15 % — ABNORMAL HIGH (ref 11.5–14.5)
WBC: 10.9 10*3/uL — AB (ref 3.8–10.6)

## 2017-08-14 LAB — ETHANOL

## 2017-08-14 LAB — LACTIC ACID, PLASMA: Lactic Acid, Venous: 1 mmol/L (ref 0.5–1.9)

## 2017-08-14 LAB — LIPASE, BLOOD: LIPASE: 39 U/L (ref 11–51)

## 2017-08-14 MED ORDER — SODIUM CHLORIDE 0.9 % IV SOLN
1.0000 g | Freq: Every day | INTRAVENOUS | Status: DC
  Administered 2017-08-14 – 2017-08-16 (×3): 1 g via INTRAVENOUS
  Filled 2017-08-14: qty 10
  Filled 2017-08-14 (×2): qty 1
  Filled 2017-08-14: qty 10

## 2017-08-14 MED ORDER — CEFTRIAXONE SODIUM 1 G IJ SOLR: 1.0000 g | Freq: Once | INTRAMUSCULAR | Status: DC

## 2017-08-14 MED ORDER — HEPARIN SODIUM (PORCINE) 5000 UNIT/ML IJ SOLN
5000.0000 [IU] | Freq: Three times a day (TID) | INTRAMUSCULAR | Status: DC
  Administered 2017-08-14 – 2017-08-17 (×9): 5000 [IU] via SUBCUTANEOUS
  Filled 2017-08-14 (×9): qty 1

## 2017-08-14 MED ORDER — DOCUSATE SODIUM 100 MG PO CAPS
100.0000 mg | ORAL_CAPSULE | Freq: Two times a day (BID) | ORAL | Status: DC | PRN
  Administered 2017-08-14: 100 mg via ORAL
  Filled 2017-08-14: qty 1

## 2017-08-14 MED ORDER — BISACODYL 10 MG RE SUPP
10.0000 mg | Freq: Every day | RECTAL | Status: DC | PRN
  Filled 2017-08-14: qty 1

## 2017-08-14 MED ORDER — ACETAMINOPHEN 325 MG PO TABS
650.0000 mg | ORAL_TABLET | Freq: Four times a day (QID) | ORAL | Status: DC | PRN
  Administered 2017-08-14 – 2017-08-17 (×6): 650 mg via ORAL
  Filled 2017-08-14 (×6): qty 2

## 2017-08-14 MED ORDER — ACETAMINOPHEN 650 MG RE SUPP: 650.0000 mg | Freq: Four times a day (QID) | RECTAL | Status: DC | PRN

## 2017-08-14 MED ORDER — CARVEDILOL 6.25 MG PO TABS
3.1250 mg | ORAL_TABLET | Freq: Two times a day (BID) | ORAL | Status: DC
  Administered 2017-08-14 – 2017-08-15 (×3): 3.125 mg via ORAL
  Filled 2017-08-14 (×2): qty 1
  Filled 2017-08-14: qty 0.5
  Filled 2017-08-14: qty 1
  Filled 2017-08-14 (×2): qty 0.5

## 2017-08-14 MED ORDER — ONDANSETRON HCL 4 MG PO TABS: 4.0000 mg | ORAL_TABLET | Freq: Four times a day (QID) | ORAL | Status: DC | PRN

## 2017-08-14 MED ORDER — ASPIRIN EC 81 MG PO TBEC
81.0000 mg | DELAYED_RELEASE_TABLET | Freq: Every day | ORAL | Status: DC
  Administered 2017-08-14 – 2017-08-17 (×4): 81 mg via ORAL
  Filled 2017-08-14 (×4): qty 1

## 2017-08-14 MED ORDER — PANTOPRAZOLE SODIUM 40 MG PO TBEC
40.0000 mg | DELAYED_RELEASE_TABLET | Freq: Every day | ORAL | Status: DC
  Administered 2017-08-14 – 2017-08-15 (×2): 40 mg via ORAL
  Filled 2017-08-14 (×2): qty 1

## 2017-08-14 MED ORDER — AMLODIPINE BESYLATE 5 MG PO TABS
10.0000 mg | ORAL_TABLET | Freq: Every day | ORAL | Status: DC
  Administered 2017-08-14 – 2017-08-15 (×2): 10 mg via ORAL
  Filled 2017-08-14 (×2): qty 2

## 2017-08-14 MED ORDER — SODIUM CHLORIDE 0.9 % IV BOLUS
1000.0000 mL | Freq: Once | INTRAVENOUS | Status: AC
  Administered 2017-08-14: 1000 mL via INTRAVENOUS

## 2017-08-14 MED ORDER — SODIUM CHLORIDE 0.9 % IV SOLN
INTRAVENOUS | Status: DC
  Administered 2017-08-14 – 2017-08-17 (×7): via INTRAVENOUS

## 2017-08-14 MED ORDER — ATORVASTATIN CALCIUM 20 MG PO TABS
40.0000 mg | ORAL_TABLET | Freq: Every day | ORAL | Status: DC
  Administered 2017-08-14 – 2017-08-17 (×4): 40 mg via ORAL
  Filled 2017-08-14 (×4): qty 2

## 2017-08-14 MED ORDER — ONDANSETRON HCL 4 MG/2ML IJ SOLN: 4.0000 mg | Freq: Four times a day (QID) | INTRAMUSCULAR | Status: DC | PRN

## 2017-08-14 MED ORDER — POLYETHYLENE GLYCOL 3350 17 G PO PACK
17.0000 g | PACK | Freq: Every day | ORAL | Status: DC | PRN
  Administered 2017-08-15: 17 g via ORAL
  Filled 2017-08-14: qty 1

## 2017-08-14 MED ORDER — GABAPENTIN 100 MG PO CAPS
100.0000 mg | ORAL_CAPSULE | Freq: Three times a day (TID) | ORAL | Status: DC
  Administered 2017-08-14 – 2017-08-17 (×10): 100 mg via ORAL
  Filled 2017-08-14 (×10): qty 1

## 2017-08-14 MED ORDER — ONDANSETRON HCL 4 MG/2ML IJ SOLN
4.0000 mg | Freq: Once | INTRAMUSCULAR | Status: AC
  Administered 2017-08-14: 4 mg via INTRAVENOUS
  Filled 2017-08-14: qty 2

## 2017-08-14 NOTE — ED Provider Notes (Signed)
[facility] Emergency Department Provider Note  ____________________________________________  Time seen: Approximately 12:02 PM  I have reviewed the triage vital signs and the nursing notes.   HISTORY  Chief Complaint Weakness    HPI [name]. is a 72 y.o. male with a history of colon cancer, hypertension, systolic heart failure who complains of generalized weakness, worsening over the past week.  In the last 2 to 3 days he has had no appetite and not had anything to eat or drink.  Complains of profound generalized weakness unable to stand up.  No chest pain shortness of breath or belly pain.  No vomiting or fever.   Weakness is severe, constant, no aggravating or alleviating factors.   Past Medical History:  Diagnosis Date  . Anemia   . CAD (coronary artery disease)    a. 02/2015 Cath: LM nl, LAD 50/40 mid/distal, LCX 80p, RCA 40p, 30d, RPL1 40, CO 3.27, CI 1.65; b. cath 06/09/15: LM minor irregs, m-dLAD 50%, dLAD 40%, pLCx 80% s/p PCI/DES 0%, pRCA 40%, dRCA 30%, 1st RPLB 40%  . Chronic systolic CHF (congestive heart failure) ([place])    a. 02/2015 Echo: EF 20-25%, diff HK, mod MR, mildy dil LA, mildly dil RV w/ mod RV syst dysfxn, mildly dil RA, mod TR, PASP 73mmHg.  . Colon cancer ([place])    a. 2015 s/p colectomy followed by chemoRx with oxaliplatin & fluorouacil.  Marland Kitchen Hypertension   . Hypertensive heart disease   . Mixed Ischemic and Non-ischemic Cardiomyopathy    a. 02/2015 Echo: EF 20-25%, diff HK.  . Moderate mitral regurgitation   . Moderate tricuspid regurgitation   . Tubular adenoma of colon      Patient Active Problem List   Diagnosis Date Noted  . Anemia 09/12/2015  . Effort angina ([place]) 06/09/2015  . Coronary artery disease involving native coronary artery of native heart without angina pectoris   . Hypertensive heart disease   . CAD (coronary artery disease)   . Chronic systolic CHF (congestive heart failure) ([place])   . Mixed Ischemic  and Non-ischemic Cardiomyopathy   . Chronic systolic heart failure ([place])   . Acute on chronic heart failure ([place]) 02/15/2015  . Bilateral lower extremity edema 02/10/2015  . Abdominal pain 02/10/2015  . Cough 02/10/2015  . Neuropathy ([place]) 09/16/2014  . Tubular adenoma of colon 07/20/2014  . Malignant neoplasm of sigmoid colon ([place]) 07/20/2014  . Benign colon polyp 07/20/2014  . Dia hypertension 07/20/2014     Past Surgical History:  Procedure Laterality Date  . CARDIAC CATHETERIZATION Bilateral 02/24/2015   Procedure: Right/Left Heart Cath and Coronary Angiography;  Surgeon: Wellington Hampshire, MD;  Location: [place] CV LAB;  Service: Cardiovascular;  Laterality: Bilateral;  . CARDIAC CATHETERIZATION N/A 06/09/2015   Procedure: Coronary Stent Intervention;  Surgeon: Wellington Hampshire, MD;  Location: [place] CV LAB;  Service: Cardiovascular;  Laterality: N/A;  . COLON SURGERY     Colectomy  . COLONOSCOPY    . COLONOSCOPY WITH PROPOFOL N/A 12/11/2016   Procedure: COLONOSCOPY WITH PROPOFOL;  Surgeon: Lollie Sails, MD;  Location[facility] ENDOSCOPY;  Service: Endoscopy;  Laterality: N/A;  . ESOPHAGOGASTRODUODENOSCOPY    . PORT-A-CATH REMOVAL N/A 07/12/2014   Procedure: REMOVAL PORT-A-CATH;  Surgeon: Molly Maduro, MD;  Location: [facility] ORS;  Service: General;  Laterality: N/A;  . PORTACATH PLACEMENT Left      Prior to Admission medications   Medication Sig Start Date End Date Taking? Authorizing Provider  amLODipine (NORVASC) 10 MG tablet take 1 tablet by mouth once daily 05/16/17  Yes Wellington Hampshire, MD  aspirin EC 81 MG tablet Take 1 tablet (81 mg total) by mouth daily. 04/14/15  Yes Theora Gianotti, NP  atorvastatin (LIPITOR) 40 MG tablet take 1 tablet by mouth once daily 05/16/17  Yes Wellington Hampshire, MD  carvedilol (COREG) 3.125 MG tablet Take 1 tablet (3.125 mg total) by mouth 2 (two) times daily. 08/02/16  Yes Wellington Hampshire, MD  furosemide (LASIX) 40 MG  tablet take 1 tablet by mouth once daily 07/16/17  Yes Wellington Hampshire, MD  gabapentin (NEURONTIN) 100 MG capsule Take 2 capsules (200 mg total) by mouth 3 (three) times daily. Patient taking differently: Take 100 mg by mouth 3 (three) times daily.  01/14/15  Yes Lequita Asal, MD  lisinopril (PRINIVIL,ZESTRIL) 40 MG tablet take 1 tablet by mouth once daily 05/16/17  Yes Arida, Mertie Clause, MD  omeprazole (PRILOSEC) 20 MG capsule Take 20 mg by mouth daily.   Yes [provider]  spironolactone (ALDACTONE) 25 MG tablet Take 1 tablet (25 mg total) by mouth daily. 08/02/16 08/14/17 Yes Wellington Hampshire, MD     Allergies Shellfish allergy   Family History  Family history unknown: Yes    Social History Social History   Tobacco Use  . Smoking status: Never Smoker  . Smokeless tobacco: Never Used  Substance Use Topics  . Alcohol use: No  . Drug use: No    Review of Systems  Constitutional:   No fever or chills.  ENT:   No sore throat. No rhinorrhea. Cardiovascular:   No chest pain or syncope. Respiratory:   No dyspnea or cough. Gastrointestinal:   Negative for abdominal pain, vomiting and diarrhea.  Musculoskeletal:   Negative for focal pain or swelling All other systems reviewed and are negative except as documented above in ROS and HPI.  ____________________________________________   PHYSICAL EXAM:  VITAL SIGNS: ED Triage Vitals [08/14/17 0934]  Enc Vitals Group     BP (!) 159/61     Pulse Rate 98     Resp 18     Temp 99.2 F (37.3 C)     Temp Source Oral     SpO2 96 %     Weight 180 lb (81.6 kg)     Height 5\' 9"  (1.753 m)     Head Circumference      Peak Flow      Pain Score 8     Pain Loc      Pain Edu?      Excl. in [place]?     Vital signs reviewed, nursing assessments reviewed.   Constitutional:   Alert and oriented. Non-toxic appearance. Eyes:   Conjunctivae are normal. EOMI. PERRL. ENT      Head:   Normocephalic and atraumatic.      Nose:    No congestion/rhinnorhea.       Mouth/Throat:   Dry mucous membranes, no pharyngeal erythema. No peritonsillar mass.  Malodorous breath      Neck:   No meningismus. Full ROM. Hematological/Lymphatic/Immunilogical:   No cervical lymphadenopathy. Cardiovascular:   RRR. Symmetric bilateral radial and DP pulses.  No murmurs.  Respiratory:   Normal respiratory effort without tachypnea/retractions. Breath sounds are clear and equal bilaterally. No wheezes/rales/rhonchi. Gastrointestinal:   Soft with mild suprapubic tenderness. Non distended. There is no CVA tenderness.  No rebound, rigidity, or guarding. Musculoskeletal:   Normal range of  motion in all extremities. No joint effusions.  No lower extremity tenderness.  No edema. Neurologic:   Normal speech and language.  Motor grossly intact. No acute focal neurologic deficits are appreciated.  Skin:    Skin is warm, dry and intact. No rash noted.  No petechiae, purpura, or bullae.  ____________________________________________    LABS (pertinent positives/negatives) (all labs ordered are listed, but only abnormal results are displayed) Labs Reviewed  CBC - Abnormal; Notable for the following components:      Result Value   WBC 10.9 (*)    RBC 3.51 (*)    Hemoglobin 11.2 (*)    HCT 33.1 (*)    RDW 15.0 (*)    All other components within normal limits  URINALYSIS, COMPLETE (UACMP) WITH MICROSCOPIC - Abnormal; Notable for the following components:   Color, Urine AMBER (*)    APPearance CLOUDY (*)    Hgb urine dipstick MODERATE (*)    Ketones, ur 5 (*)    Protein, ur 30 (*)    Leukocytes, UA MODERATE (*)    WBC, UA >50 (*)    All other components within normal limits  COMPREHENSIVE METABOLIC PANEL - Abnormal; Notable for the following components:   Glucose, Bld 224 (*)    BUN 45 (*)    Creatinine, Ser 3.16 (*)    Calcium 8.6 (*)    Albumin 2.9 (*)    Total Bilirubin 2.0 (*)    GFR calc non Af Amer 18 (*)    GFR calc Af Amer 21 (*)     All other components within normal limits  URINE CULTURE  LIPASE, BLOOD  ETHANOL  CBG MONITORING, ED   ____________________________________________   EKG  Interpreted by me Normal sinus rhythm rate of 99, normal axis and intervals.  Normal QRS ST segments and T waves.  ____________________________________________    [id number]  Dg Abdomen Acute W/chest  Result Date: 08/14/2017 CLINICAL DATA:  Generalized weakness. Generalized abdominal pain. Episode of rectal bleeding approximately 1 month ago. Personal history of colon cancer post colectomy and chemotherapy. EXAM: DG ABDOMEN ACUTE W/ 1V CHEST COMPARISON:  CT chest, abdomen and pelvis 01/12/2016 and earlier. Chest x-rays 02/10/2015, 01/15/2015 and earlier. FINDINGS: Bowel gas pattern unremarkable without evidence of obstruction or significant ileus. No evidence of free air or significant air-fluid levels on the erect image; scattered air-fluid levels in the nondistended ascending and proximal transverse colon indicates liquid stool. Expected stool burden in the colon. No abnormal calcifications. Degenerative changes involving the lumbar spine and both hips. Cardiac silhouette upper normal in size to slightly enlarged, decreased in size since the 2017 chest x-ray. Hilar and mediastinal contours otherwise unremarkable. Lungs clear. Bronchovascular markings normal. Pulmonary vascularity normal. No visible pleural effusions. No pneumothorax. IMPRESSION: 1. No acute abdominal abnormality. 2. Borderline heart size.  No acute cardiopulmonary disease. Electronically Signed   By: Evangeline Dakin M.D.   On: 08/14/2017 10:29    ____________________________________________   PROCEDURES Procedures  ____________________________________________  DIFFERENTIAL DIAGNOSIS   Metabolic derangement, dehydration, urinary tract infection, pneumonia  CLINICAL IMPRESSION / ASSESSMENT AND PLAN / ED COURSE  Pertinent labs & imaging results that were  available during my care of the patient were reviewed by me and considered in my medical decision making (see chart for details).    Low suspicion biliary disease AAA pancreatitis perforation obstruction ACS PE or dissection.  Patient is not septic, vital signs unremarkable.  Check labs, give IV fluids for hydration.  Clinical Course as  of Aug 14 1200  Wed Aug 14, 2017  1049 Acute renal failure.  Electrolytes are okay but with his weakness and poor oral intake, I will discuss with hospitalist for further evaluation.  Creatinine(!): 3.16 [PS]  1200 UA concerning for urinary tract infection.  I will check a urine culture.  1 g IV ceftriaxone for UTI.  WBC, UA(!): >50 [PS]    Clinical Course User Index [PS] Carrie Mew, MD     ____________________________________________   FINAL CLINICAL IMPRESSION(S) / ED DIAGNOSES    Final diagnoses:  Acute renal failure, unspecified acute renal failure type [facility])  Lower urinary tract infectious disease  Generalized weakness     ED Discharge Orders    None      Portions of this note were generated with dragon dictation software. Dictation errors may occur despite best attempts at proofreading.    Carrie Mew, MD 08/14/17 [phone]

## 2017-08-14 NOTE — ED Notes (Signed)
Patient transported to Ultrasound 

## 2017-08-14 NOTE — ED Triage Notes (Signed)
To ER via POV c/o generalized weakness and abdominal pain. Pt states he had episode of rectal bleeding last month, was never seen by doctor for it. Pt alert and oriented X4, active, cooperative, pt in NAD. RR even and unlabored, color WNL.

## 2017-08-14 NOTE — ED Notes (Signed)
Patient states he has been feeling very weak lately.  Patient reports not wanting to eat or drink very much.  Patient also reports being out of his blood pressure medications for several months because he has not been able to see his doctor.  Patient seems confused about this, states, "I think he's mad at me or something."

## 2017-08-14 NOTE — H&P (Signed)
[place] [facility] [name] NAME: [name]    MR#:  [id number]  DATE OF BIRTH:  1945-03-08  DATE OF ADMISSION:  08/14/2017  PRIMARY CARE PHYSICIAN: Leone Haven, MD   REQUESTING/REFERRING PHYSICIAN: Dr. Brenton Grills  CHIEF COMPLAINT:   Chief Complaint  Patient presents with  . Weakness    HISTORY OF PRESENT ILLNESS:  [name]  is a 72 y.o. male with a known history of coronary artery disease status post bypass, chronic systolic CHF, history of colon cancer status post colectomy with postoperative chemo, hypertension, who presents to the hospital due to generalized weakness, abdominal cramping.  Patient says he developed abdominal cramping about 2 to 3 days ago has progressively gotten worse associated with some weakness.  He also says he is constipated and has not had a bowel movement in the past 5 to 6 days.  He presents to the ER and was noted to be in acute renal failure and also noted to have a urinary tract infection based on his urinalysis.  Patient denies any dysuria, hematuria, urinary frequency, fevers, chills, shortness of breath, chest pain, nausea or vomiting or any other associated symptoms presently.  PAST MEDICAL HISTORY:   Past Medical History:  Diagnosis Date  . Anemia   . CAD (coronary artery disease)    a. 02/2015 Cath: LM nl, LAD 50/40 mid/distal, LCX 80p, RCA 40p, 30d, RPL1 40, CO 3.27, CI 1.65; b. cath 06/09/15: LM minor irregs, m-dLAD 50%, dLAD 40%, pLCx 80% s/p PCI/DES 0%, pRCA 40%, dRCA 30%, 1st RPLB 40%  . Chronic systolic CHF (congestive heart failure) ([place])    a. 02/2015 Echo: EF 20-25%, diff HK, mod MR, mildy dil LA, mildly dil RV w/ mod RV syst dysfxn, mildly dil RA, mod TR, PASP 55mmHg.  . Colon cancer ([place])    a. 2015 s/p colectomy followed by chemoRx with oxaliplatin & fluorouacil.  Marland Kitchen Hypertension   . Hypertensive heart disease   . Mixed Ischemic and Non-ischemic Cardiomyopathy    a. 02/2015 Echo: EF  20-25%, diff HK.  . Moderate mitral regurgitation   . Moderate tricuspid regurgitation   . Tubular adenoma of colon     PAST SURGICAL HISTORY:   Past Surgical History:  Procedure Laterality Date  . CARDIAC CATHETERIZATION Bilateral 02/24/2015   Procedure: Right/Left Heart Cath and Coronary Angiography;  Surgeon: Wellington Hampshire, MD;  Location: [place] CV LAB;  Service: Cardiovascular;  Laterality: Bilateral;  . CARDIAC CATHETERIZATION N/A 06/09/2015   Procedure: Coronary Stent Intervention;  Surgeon: Wellington Hampshire, MD;  Location: [place] [facility];  Service: Cardiovascular;  Laterality: N/A;  . COLON SURGERY     Colectomy  . COLONOSCOPY    . COLONOSCOPY WITH PROPOFOL N/A 12/11/2016   Procedure: COLONOSCOPY WITH PROPOFOL;  Surgeon: Lollie Sails, MD;  Location: [facility] ENDOSCOPY;  Service: Endoscopy;  Laterality: N/A;  . ESOPHAGOGASTRODUODENOSCOPY    . PORT-A-CATH REMOVAL N/A 07/12/2014   Procedure: REMOVAL PORT-A-CATH;  Surgeon: Molly Maduro, MD;  Location: [facility] ORS;  Service: General;  Laterality: N/A;  . PORTACATH PLACEMENT Left     SOCIAL HISTORY:   Social History   Tobacco Use  . Smoking status: Never Smoker  . Smokeless tobacco: Never Used  Substance Use Topics  . Alcohol use: No    FAMILY HISTORY:   Family History  Problem Relation Age of Onset  . Cancer Mother   . Cancer Father     DRUG ALLERGIES:  Allergies  Allergen Reactions  . Shellfish Allergy Other (See Comments)    Pt. instructed by MD to avoid seafood    REVIEW OF SYSTEMS:   Review of Systems  Constitutional: Negative for fever and weight loss.  HENT: Negative for congestion, nosebleeds and tinnitus.   Eyes: Negative for blurred vision, double vision and redness.  Respiratory: Negative for cough, hemoptysis and shortness of breath.   Cardiovascular: Negative for chest pain, orthopnea, leg swelling and PND.  Gastrointestinal: Positive for abdominal pain (abdpminal cramping).  Negative for diarrhea, melena, nausea and vomiting.  Genitourinary: Negative for dysuria, hematuria and urgency.  Musculoskeletal: Negative for falls and joint pain.  Neurological: Positive for weakness (Generalized). Negative for dizziness, tingling, sensory change, focal weakness, seizures and headaches.  Endo/Heme/Allergies: Negative for polydipsia. Does not bruise/bleed easily.  Psychiatric/Behavioral: Negative for depression and memory loss. The patient is not nervous/anxious.     MEDICATIONS AT HOME:   Prior to Admission medications   Medication Sig Start Date End Date Taking? Authorizing Provider  amLODipine (NORVASC) 10 MG tablet take 1 tablet by mouth once daily 05/16/17  Yes Wellington Hampshire, MD  aspirin EC 81 MG tablet Take 1 tablet (81 mg total) by mouth daily. 04/14/15  Yes Theora Gianotti, NP  atorvastatin (LIPITOR) 40 MG tablet take 1 tablet by mouth once daily 05/16/17  Yes Wellington Hampshire, MD  carvedilol (COREG) 3.125 MG tablet Take 1 tablet (3.125 mg total) by mouth 2 (two) times daily. 08/02/16  Yes Wellington Hampshire, MD  furosemide (LASIX) 40 MG tablet take 1 tablet by mouth once daily 07/16/17  Yes Wellington Hampshire, MD  gabapentin (NEURONTIN) 100 MG capsule Take 2 capsules (200 mg total) by mouth 3 (three) times daily. Patient taking differently: Take 100 mg by mouth 3 (three) times daily.  01/14/15  Yes Lequita Asal, MD  lisinopril (PRINIVIL,ZESTRIL) 40 MG tablet take 1 tablet by mouth once daily 05/16/17  Yes Arida, Mertie Clause, MD  omeprazole (PRILOSEC) 20 MG capsule Take 20 mg by mouth daily.   Yes [provider]  spironolactone (ALDACTONE) 25 MG tablet Take 1 tablet (25 mg total) by mouth daily. 08/02/16 08/14/17 Yes Wellington Hampshire, MD      VITAL SIGNS:  Blood pressure (!) 175/60, pulse 87, temperature 99.2 F (37.3 C), temperature source Oral, resp. rate (!) 24, height 5\' 9"  (1.753 m), weight 81.6 kg (180 lb), SpO2 99 %.  PHYSICAL  EXAMINATION:  Physical Exam  GENERAL:  72 y.o.-year-old patient lying in the bed with no acute distress.  EYES: Pupils equal, round, reactive to light and accommodation. No scleral icterus. Extraocular muscles intact.  HEENT: Head atraumatic, normocephalic. Oropharynx and nasopharynx clear. No oropharyngeal erythema, moist oral mucosa  NECK:  Supple, no jugular venous distention. No thyroid enlargement, no tenderness.  LUNGS: Normal breath sounds bilaterally, no wheezing, rales, rhonchi. No use of accessory muscles of respiration.  CARDIOVASCULAR: S1, S2 RRR. No murmurs, rubs, gallops, clicks.  ABDOMEN: Soft, nontender, nondistended. Bowel sounds present. No organomegaly or mass.  EXTREMITIES: No pedal edema, cyanosis, or clubbing. + 2 pedal & radial pulses b/l.   NEUROLOGIC: Cranial nerves II through XII are intact. No focal Motor or sensory deficits appreciated b/l PSYCHIATRIC: The patient is alert and oriented x 3.  SKIN: No obvious rash, lesion, or ulcer.   LABORATORY PANEL:   CBC Recent Labs  Lab 08/14/17 1003  WBC 10.9*  HGB 11.2*  HCT 33.1*  PLT 159   ------------------------------------------------------------------------------------------------------------------  Chemistries  Recent Labs  Lab 08/14/17 1003  NA 137  K 4.8  CL 103  CO2 22  GLUCOSE 224*  BUN 45*  CREATININE 3.16*  CALCIUM 8.6*  AST 35  ALT 41  ALKPHOS 101  BILITOT 2.0*   ------------------------------------------------------------------------------------------------------------------  Cardiac Enzymes No results for input(s): TROPONINI in the last 168 hours. ------------------------------------------------------------------------------------------------------------------  RADIOLOGY:  Dg Abdomen Acute W/chest  Result Date: 08/14/2017 CLINICAL DATA:  Generalized weakness. Generalized abdominal pain. Episode of rectal bleeding approximately 1 month ago. Personal history of colon cancer post  colectomy and chemotherapy. EXAM: DG ABDOMEN ACUTE W/ 1V CHEST COMPARISON:  CT chest, abdomen and pelvis 01/12/2016 and earlier. Chest x-rays 02/10/2015, 01/15/2015 and earlier. FINDINGS: Bowel gas pattern unremarkable without evidence of obstruction or significant ileus. No evidence of free air or significant air-fluid levels on the erect image; scattered air-fluid levels in the nondistended ascending and proximal transverse colon indicates liquid stool. Expected stool burden in the colon. No abnormal calcifications. Degenerative changes involving the lumbar spine and both hips. Cardiac silhouette upper normal in size to slightly enlarged, decreased in size since the 2017 chest x-ray. Hilar and mediastinal contours otherwise unremarkable. Lungs clear. Bronchovascular markings normal. Pulmonary vascularity normal. No visible pleural effusions. No pneumothorax. IMPRESSION: 1. No acute abdominal abnormality. 2. Borderline heart size.  No acute cardiopulmonary disease. Electronically Signed   By: Evangeline Dakin M.D.   On: 08/14/2017 10:29     IMPRESSION AND PLAN:   72 year old male with past medical history of hypertension, hyperlipidemia, GERD, history of chronic systolic CHF, coronary artery disease status post bypass, history of colon cancer status post colectomy with postoperative chemo who presents to the hospital due to generalized weakness, abdominal cramping and noted to be in acute kidney injury and also noted to have urinary tract infection.  1.  Acute renal failure-secondary to the underlying UTI and also dehydration. - We will gently hydrate the patient with IV fluids, follow BUN and creatinine urine output.  Hold lisinopril.  Hold Aldactone. -We will treat patient's underlying UTI with IV ceftriaxone, follow urine cultures. -We will get renal ultrasound to rule out obstructive pathology.  2.  Urinary tract infection-we will treat patient with IV ceftriaxone, follow urine cultures.  3.   History of chronic systolic CHF-clinically patient is not in congestive heart failure. Given the acute kidney injury-we will hold lisinopril, Lasix, Aldactone.  Continue carvedilol.  4.  Neuropathy-continue gabapentin.  5.  Hyperlipidemia-continue atorvastatin.  6.  Essential hypertension-continue Norvasc, carvedilol.  7.  GERD-continue Protonix.    All the records are reviewed and case discussed with ED provider. Management plans discussed with the patient, family and they are in agreement.  CODE STATUS: Full code  TOTAL TIME TAKING CARE OF THIS PATIENT: 40 minutes.    Henreitta Leber M.D on 08/14/2017 at 12:27 PM  Between 7am to 6pm - Pager - [phone]  After 6pm go to [email or web address] - password [email or web address] [facility]  [place] [facility]  Office  [phone]  CC: Primary care physician; Leone Haven, MD

## 2017-08-14 NOTE — ED Notes (Signed)
Patient given graham crackers and gingerale.  Patient tolerating well at this time.

## 2017-08-15 ENCOUNTER — Inpatient Hospital Stay: Payer: PPO

## 2017-08-15 LAB — GLUCOSE, CAPILLARY
GLUCOSE-CAPILLARY: 182 mg/dL — AB (ref 70–99)
GLUCOSE-CAPILLARY: 193 mg/dL — AB (ref 70–99)
Glucose-Capillary: 225 mg/dL — ABNORMAL HIGH (ref 70–99)
Glucose-Capillary: 167 mg/dL — ABNORMAL HIGH (ref 70–99)

## 2017-08-15 LAB — CBC
HCT: 28.2 % — ABNORMAL LOW (ref 40.0–52.0)
Hemoglobin: 9.5 g/dL — ABNORMAL LOW (ref 13.0–18.0)
MCH: 31.7 pg (ref 26.0–34.0)
MCHC: 33.8 g/dL (ref 32.0–36.0)
MCV: 94 fL (ref 80.0–100.0)
Platelets: 141 K/uL — ABNORMAL LOW (ref 150–440)
RBC: 3 MIL/uL — ABNORMAL LOW (ref 4.40–5.90)
RDW: 14.9 % — ABNORMAL HIGH (ref 11.5–14.5)
WBC: 8.4 K/uL (ref 3.8–10.6)

## 2017-08-15 LAB — BASIC METABOLIC PANEL WITH GFR
Anion gap: 8 (ref 5–15)
BUN: 45 mg/dL — ABNORMAL HIGH (ref 8–23)
CO2: 21 mmol/L — ABNORMAL LOW (ref 22–32)
Calcium: 7.7 mg/dL — ABNORMAL LOW (ref 8.9–10.3)
Chloride: 109 mmol/L (ref 98–111)
Creatinine, Ser: 3.26 mg/dL — ABNORMAL HIGH (ref 0.61–1.24)
GFR calc Af Amer: 21 mL/min — ABNORMAL LOW
GFR calc non Af Amer: 18 mL/min — ABNORMAL LOW
Glucose, Bld: 238 mg/dL — ABNORMAL HIGH (ref 70–99)
Potassium: 4.6 mmol/L (ref 3.5–5.1)
Sodium: 138 mmol/L (ref 135–145)

## 2017-08-15 LAB — HEMOGLOBIN A1C
Hgb A1c MFr Bld: 7.8 % — ABNORMAL HIGH (ref 4.8–5.6)
Mean Plasma Glucose: 177.16 mg/dL

## 2017-08-15 LAB — URINE CULTURE

## 2017-08-15 MED ORDER — POLYETHYLENE GLYCOL 3350 17 G PO PACK
17.0000 g | PACK | Freq: Every day | ORAL | Status: DC
  Administered 2017-08-17: 17 g via ORAL
  Filled 2017-08-15 (×2): qty 1

## 2017-08-15 MED ORDER — INSULIN ASPART 100 UNIT/ML ~~LOC~~ SOLN
0.0000 [IU] | Freq: Three times a day (TID) | SUBCUTANEOUS | Status: DC
  Administered 2017-08-15: 17:00:00 2 [IU] via SUBCUTANEOUS
  Administered 2017-08-15 – 2017-08-16 (×2): 3 [IU] via SUBCUTANEOUS
  Administered 2017-08-16 – 2017-08-17 (×3): 2 [IU] via SUBCUTANEOUS
  Administered 2017-08-17 (×2): 1 [IU] via SUBCUTANEOUS
  Filled 2017-08-15 (×8): qty 1

## 2017-08-15 MED ORDER — PANTOPRAZOLE SODIUM 40 MG PO TBEC
40.0000 mg | DELAYED_RELEASE_TABLET | Freq: Every day | ORAL | Status: DC
  Administered 2017-08-16 – 2017-08-17 (×2): 40 mg via ORAL
  Filled 2017-08-15 (×2): qty 1

## 2017-08-15 MED ORDER — DOCUSATE SODIUM 100 MG PO CAPS
100.0000 mg | ORAL_CAPSULE | Freq: Every day | ORAL | Status: DC
  Administered 2017-08-16 – 2017-08-17 (×2): 100 mg via ORAL
  Filled 2017-08-15 (×2): qty 1

## 2017-08-15 MED ORDER — INSULIN ASPART 100 UNIT/ML ~~LOC~~ SOLN: 0.0000 [IU] | Freq: Every day | SUBCUTANEOUS | Status: DC

## 2017-08-15 MED ORDER — IOPAMIDOL (ISOVUE-300) INJECTION 61%
15.0000 mL | INTRAVENOUS | Status: AC
Start: 2017-08-15 — End: 2017-08-15
  Administered 2017-08-15 (×2): 15 mL via ORAL

## 2017-08-15 MED ORDER — CARVEDILOL 3.125 MG PO TABS: 3.1250 mg | ORAL_TABLET | Freq: Two times a day (BID) | ORAL | Status: DC

## 2017-08-15 NOTE — Consult Note (Signed)
[facility] CONSULT NOTE    Date: 08/15/2017                  Patient Name:  [name].  MRN: [id number]  DOB: 29-Jun-1945  Age / Sex: 72 y.o., male         PCP: Leone Haven, MD                 Service Requesting Consult: Hospitalist                 Reason for Consult: Acute renal failure            History of Present Illness: Patient is a 72 y.o. male with a PMHx of coronary artery disease, chronic systolic heart failure ejection fraction 20 to 25%, colon cancer status post colectomy followed by chemotherapy, hypertension, chronic kidney disease stage II, who was admitted to [facility] on 08/14/2017 for evaluation of weakness.  2 to 3 days prior to admission he was having abdominal cramping and also developed anorexia.  His p.o. intake has been diminished.  He also has had recent constipation.  Further evaluation here he was found to be in acute renal failure.  His baseline creatinine appears to be 1.31 with an EGFR greater than 60.  His creatinine was up to 3.16 upon admission.  Renal ultrasound was performed and showed a left upper pole renal mass.  He was also diagnosed with a UTI and started on IV antibiotics.  Patient denies ingestion of NSAIDs at home.  He was on Lasix as well as lisinopril at home.   Medications: Outpatient medications: Medications Prior to Admission  Medication Sig Dispense Refill Last Dose  . amLODipine (NORVASC) 10 MG tablet take 1 tablet by mouth once daily 30 tablet 0   . aspirin EC 81 MG tablet Take 1 tablet (81 mg total) by mouth daily. 90 tablet 3 12/10/2016 at Unknown time  . atorvastatin (LIPITOR) 40 MG tablet take 1 tablet by mouth once daily 30 tablet 0   . carvedilol (COREG) 3.125 MG tablet Take 1 tablet (3.125 mg total) by mouth 2 (two) times daily. 180 tablet 2 12/10/2016 at Unknown time  . furosemide (LASIX) 40 MG tablet take 1 tablet by mouth once daily 90 tablet 0   . gabapentin (NEURONTIN) 100 MG capsule Take 2  capsules (200 mg total) by mouth 3 (three) times daily. (Patient taking differently: Take 100 mg by mouth 3 (three) times daily. ) 180 capsule 0 12/10/2016 at Unknown time  . lisinopril (PRINIVIL,ZESTRIL) 40 MG tablet take 1 tablet by mouth once daily 30 tablet 0   . omeprazole (PRILOSEC) 20 MG capsule Take 20 mg by mouth daily.   12/10/2016 at Unknown time  . spironolactone (ALDACTONE) 25 MG tablet Take 1 tablet (25 mg total) by mouth daily. 90 tablet 2 12/10/2016 at Unknown time    Current medications: Current Facility-Administered Medications  Medication Dose Route Frequency Provider Last Rate Last Dose  . 0.9 %  sodium chloride infusion   Intravenous Continuous Henreitta Leber, MD 100 mL/hr at 08/15/17 1105    . acetaminophen (TYLENOL) tablet 650 mg  650 mg Oral Q6H PRN Henreitta Leber, MD   650 mg at 08/15/17 1715   Or  . acetaminophen (TYLENOL) suppository 650 mg  650 mg Rectal Q6H PRN Henreitta Leber, MD      . aspirin EC tablet 81 mg  81 mg Oral Daily Sainani, Vivek J,  MD   81 mg at 08/15/17 0853  . atorvastatin (LIPITOR) tablet 40 mg  40 mg Oral Daily Henreitta Leber, MD   40 mg at 08/15/17 0852  . bisacodyl (DULCOLAX) suppository 10 mg  10 mg Rectal Daily PRN Henreitta Leber, MD      . cefTRIAXone (ROCEPHIN) 1 g in sodium chloride 0.9 % 100 mL IVPB  1 g Intravenous q1800 Henreitta Leber, MD 200 mL/hr at 08/15/17 1709 1 g at 08/15/17 1709  . [START ON 08/16/2017] docusate sodium (COLACE) capsule 100 mg  100 mg Oral Daily Demetrios Loll, MD      . gabapentin (NEURONTIN) capsule 100 mg  100 mg Oral TID Henreitta Leber, MD   100 mg at 08/15/17 1709  . heparin injection 5,000 Units  5,000 Units Subcutaneous Q8H Henreitta Leber, MD   5,000 Units at 08/15/17 1205  . insulin aspart (novoLOG) injection 0-5 Units  0-5 Units Subcutaneous QHS Demetrios Loll, MD      . insulin aspart (novoLOG) injection 0-9 Units  0-9 Units Subcutaneous TID WC Demetrios Loll, MD   2 Units at 08/15/17 1709  . ondansetron  (ZOFRAN) tablet 4 mg  4 mg Oral Q6H PRN Henreitta Leber, MD       Or  . ondansetron (ZOFRAN) injection 4 mg  4 mg Intravenous Q6H PRN Henreitta Leber, MD      . Derrill Memo ON 08/16/2017] pantoprazole (PROTONIX) EC tablet 40 mg  40 mg Oral QAC breakfast Demetrios Loll, MD      . Derrill Memo ON 08/16/2017] polyethylene glycol (MIRALAX / GLYCOLAX) packet 17 g  17 g Oral Daily Demetrios Loll, MD          Allergies: Allergies  Allergen Reactions  . Shellfish Allergy Other (See Comments)    Pt. instructed by MD to avoid seafood      Past Medical History: Past Medical History:  Diagnosis Date  . Anemia   . CAD (coronary artery disease)    a. 02/2015 Cath: LM nl, LAD 50/40 mid/distal, LCX 80p, RCA 40p, 30d, RPL1 40, CO 3.27, CI 1.65; b. cath 06/09/15: LM minor irregs, m-dLAD 50%, dLAD 40%, pLCx 80% s/p PCI/DES 0%, pRCA 40%, dRCA 30%, 1st RPLB 40%  . Chronic systolic CHF (congestive heart failure) ([place])    a. 02/2015 Echo: EF 20-25%, diff HK, mod MR, mildy dil LA, mildly dil RV w/ mod RV syst dysfxn, mildly dil RA, mod TR, PASP 36mHg.  . Colon cancer ([place]    a. 2015 s/p colectomy followed by chemoRx with oxaliplatin & fluorouacil.  .Marland KitchenHypertension   . Hypertensive heart disease   . Mixed Ischemic and Non-ischemic Cardiomyopathy    a. 02/2015 Echo: EF 20-25%, diff HK.  . Moderate mitral regurgitation   . Moderate tricuspid regurgitation   . Tubular adenoma of colon      Past Surgical History: Past Surgical History:  Procedure Laterality Date  . CARDIAC CATHETERIZATION Bilateral 02/24/2015   Procedure: Right/Left Heart Cath and Coronary Angiography;  Surgeon: MWellington Hampshire MD;  [facility];  Service: Cardiovascular;  Laterality: Bilateral;  . CARDIAC CATHETERIZATION N/A 06/09/2015   Procedure: Coronary Stent Intervention;  Surgeon: MWellington Hampshire MD;  [facility];  Service: Cardiovascular;  Laterality: N/A;  . COLON SURGERY     Colectomy  . COLONOSCOPY    .  COLONOSCOPY WITH PROPOFOL N/A 12/11/2016   Procedure: COLONOSCOPY WITH PROPOFOL;  Surgeon: SLoistine Simas  U, MD;  Location: [facility];  Service: Endoscopy;  Laterality: N/A;  . ESOPHAGOGASTRODUODENOSCOPY    . PORT-A-CATH REMOVAL N/A 07/12/2014   Procedure: REMOVAL PORT-A-CATH;  Surgeon: Molly Maduro, MD;  Location: [facility];  Service: General;  Laterality: N/A;  . PORTACATH PLACEMENT Left      Family History: Family History  Problem Relation Age of Onset  . Cancer Mother   . Cancer Father      Social History: Social History   Socioeconomic History  . Marital status: Married    Spouse name: Not on file  . Number of children: Not on file  . Years of education: Not on file  . Highest education level: Not on file  Occupational History  . Not on file  Social Needs  . Financial resource strain: Not on file  . Food insecurity:    Worry: Not on file    Inability: Not on file  . Transportation needs:    Medical: Not on file    Non-medical: Not on file  Tobacco Use  . Smoking status: Never Smoker  . Smokeless tobacco: Never Used  Substance and Sexual Activity  . Alcohol use: No  . Drug use: No  . Sexual activity: Not on file  Lifestyle  . Physical activity:    Days per week: Not on file    Minutes per session: Not on file  . Stress: Not on file  Relationships  . Social connections:    Talks on phone: Not on file    Gets together: Not on file    Attends religious service: Not on file    Active member of club or organization: Not on file    Attends meetings of clubs or organizations: Not on file    Relationship status: Not on file  . Intimate partner violence:    Fear of current or ex partner: Not on file    Emotionally abused: Not on file    Physically abused: Not on file    Forced sexual activity: Not on file  Other Topics Concern  . Not on file  Social History Narrative  . Not on file     Review of Systems: Review of Systems  Constitutional:  Positive for malaise/fatigue. Negative for chills and fever.  HENT: Positive for hearing loss. Negative for congestion and tinnitus.   Eyes: Negative for blurred vision and double vision.  Respiratory: Negative for cough and hemoptysis.   Cardiovascular: Negative for chest pain, palpitations and orthopnea.  Gastrointestinal: Positive for abdominal pain, constipation and nausea.  Genitourinary: Negative for dysuria, hematuria and urgency.  Musculoskeletal: Negative for back pain and myalgias.  Skin: Negative for rash.  Neurological: Negative for dizziness and focal weakness.  Endo/Heme/Allergies: Negative for polydipsia. Does not bruise/bleed easily.  Psychiatric/Behavioral: Negative for depression. The patient is not nervous/anxious.     Vital Signs: Blood pressure (!) 106/53, pulse 78, temperature 98.8 F (37.1 C), temperature source Oral, resp. rate 20, height '5\' 9"'$  (1.753 m), weight 80.4 kg (177 lb 5 oz), SpO2 95 %.  Weight trends: Filed Weights   08/14/17 0934 08/14/17 1352  Weight: 81.6 kg (180 lb) 80.4 kg (177 lb 5 oz)    Physical Exam: General: NAD, resting in bed comfortably  Head: Normocephalic, atraumatic.  Eyes: Anicteric, EOMI  Nose: Mucous membranes moist, not inflammed, nonerythematous.  Throat: Oropharynx nonerythematous, no exudate appreciated.   Neck: Supple, trachea midline.  Lungs:  Normal respiratory effort. Clear to auscultation BL without crackles or wheezes.  Heart: RRR. S1 and S2 normal without gallop, murmur, or rubs.  Abdomen:  BS normoactive. Soft, Nondistended, non-tender.  No masses or organomegaly.  Extremities: No pretibial edema.  Neurologic: A&O X3, Motor strength is 5/5 in the all 4 extremities  Skin: No visible rashes, scars.    Lab results: Basic Metabolic Panel: Recent Labs  Lab 08/14/17 1003 08/15/17 0451  NA 137 138  K 4.8 4.6  CL 103 109  CO2 22 21*  GLUCOSE 224* 238*  BUN 45* 45*  CREATININE 3.16* 3.26*  CALCIUM 8.6* 7.7*     Liver Function Tests: Recent Labs  Lab 08/14/17 1003  AST 35  ALT 41  ALKPHOS 101  BILITOT 2.0*  PROT 8.1  ALBUMIN 2.9*   Recent Labs  Lab 08/14/17 1003  LIPASE 39   No results for input(s): AMMONIA in the last 168 hours.  CBC: Recent Labs  Lab 08/14/17 1003 08/15/17 0451  WBC 10.9* 8.4  HGB 11.2* 9.5*  HCT 33.1* 28.2*  MCV 94.4 94.0  PLT 159 141*    Cardiac Enzymes: No results for input(s): CKTOTAL, CKMB, CKMBINDEX, TROPONINI in the last 168 hours.  BNP: Invalid input(s): POCBNP  CBG: Recent Labs  Lab 08/15/17 1141 08/15/17 1703  GLUCAP 225* 167*    Microbiology: Results for orders placed or performed during the hospital encounter of 08/14/17  Urine Culture     Status: Abnormal   Collection Time: 08/14/17 10:56 AM  Result Value Ref Range Status   Specimen Description   Final    URINE, RANDOM Performed at [facility], [address] [place], [place] [address]    Special Requests   Final    NONE Performed at [facility], [address] [place], [place] [address]    Culture MULTIPLE SPECIES PRESENT, SUGGEST RECOLLECTION (A)  Final   Report Status 08/15/2017 FINAL  Final  CULTURE, BLOOD (ROUTINE X 2) w Reflex to ID Panel     Status: None (Preliminary result)   Collection Time: 08/14/17  9:23 PM  Result Value Ref Range Status   Specimen Description BLOOD BLOOD RIGHT WRIST  Final   Special Requests   Final    BOTTLES DRAWN AEROBIC AND ANAEROBIC Blood Culture adequate volume   Culture   Final    NO GROWTH < 12 HOURS Performed at [facility], [address] [place], [place] [address]    Report Status PENDING  Incomplete  CULTURE, BLOOD (ROUTINE X 2) w Reflex to ID Panel     Status: None (Preliminary result)   Collection Time: 08/14/17  9:23 PM  Result Value Ref Range Status   Specimen Description BLOOD BLOOD LEFT WRIST  Final   Special Requests   Final    BOTTLES DRAWN AEROBIC AND ANAEROBIC Blood Culture  adequate volume   Culture   Final    NO GROWTH < 12 HOURS Performed at [facility], [address] [place], [place] [address]    Report Status PENDING  Incomplete    Coagulation Studies: No results for input(s): LABPROT, INR in the last 72 hours.  Urinalysis: Recent Labs    08/14/17 1056  COLORURINE AMBER*  LABSPEC 1.017  PHURINE 5.0  GLUCOSEU NEGATIVE  HGBUR MODERATE*  BILIRUBINUR NEGATIVE  KETONESUR 5*  PROTEINUR 30*  NITRITE NEGATIVE  LEUKOCYTESUR MODERATE*      Imaging: US Renal  Result Date: 08/14/2017 CLINICAL DATA:  Acute renal failure, history of colon cancer. EXAM: RENAL / URINARY TRACT ULTRASOUND COMPLETE  COMPARISON:  CT 01/12/2016 FINDINGS: Right Kidney: Length: 10.3 cm. Two adjacent cysts are redemonstrated in the lower pole the right kidney 1 slightly exophytic measuring 1.1 x 1.1 x 1 cm, similar in size to prior CT. Adjacent more cortically based cyst measures 2.1 x 2.2 x 2.8 cm also similar in size. No nephrolithiasis nor hydronephrosis. Cortical-medullary distinction is maintained. Left Kidney: Length: 11.8 cm. Echogenicity within normal limits. Heterogeneous appearance of the upper pole of the left kidney with subtle masslike abnormality suggested measuring 2.6 x 1.7 x 2.1 cm. Dedicated renal protocol CT or MRI is recommended without and with IV contrast. Mild dilatation of the intrarenal collecting system compatible with mild hydronephrosis or ectasia is noted. A cyst similar in size to prior CT is noted in the lower pole measuring 1.5 x 1.5 x 1.6 cm Bladder: Appears normal for degree of bladder distention. Other: At least 4 subcentimeter rounded nonshadowing echogenic lesions are identified of the normal sized spleen more commonly associated with hemangiomata given their echogenicity. No worrisome features are noted. No additional workup required per consensus guidelines. IMPRESSION: 1. A 2.6 x 1.7 x 2.1 cm solid masslike abnormality suggested of the  upper pole the left kidney for which dedicated CT or MRI using renal protocol is recommended for further characterization. 2. Stable bilateral renal cysts are otherwise noted. There is mild fullness of left renal collecting system that may represent mild hydronephrosis. 3. Rounded echogenic nonshadowing subcentimeter lesions of the spleen more commonly associated with hemangiomata. No additional workup required given nonaggressive features. Electronically Signed   By: Ashley Royalty M.D.   On: 08/14/2017 14:02   Dg Abdomen Acute W/chest  Result Date: 08/14/2017 CLINICAL DATA:  Generalized weakness. Generalized abdominal pain. Episode of rectal bleeding approximately 1 month ago. Personal history of colon cancer post colectomy and chemotherapy. EXAM: DG ABDOMEN ACUTE W/ 1V CHEST COMPARISON:  CT chest, abdomen and pelvis 01/12/2016 and earlier. Chest x-rays 02/10/2015, 01/15/2015 and earlier. FINDINGS: Bowel gas pattern unremarkable without evidence of obstruction or significant ileus. No evidence of free air or significant air-fluid levels on the erect image; scattered air-fluid levels in the nondistended ascending and proximal transverse colon indicates liquid stool. Expected stool burden in the colon. No abnormal calcifications. Degenerative changes involving the lumbar spine and both hips. Cardiac silhouette upper normal in size to slightly enlarged, decreased in size since the 2017 chest x-ray. Hilar and mediastinal contours otherwise unremarkable. Lungs clear. Bronchovascular markings normal. Pulmonary vascularity normal. No visible pleural effusions. No pneumothorax. IMPRESSION: 1. No acute abdominal abnormality. 2. Borderline heart size.  No acute cardiopulmonary disease. Electronically Signed   By: Evangeline Dakin M.D.   On: 08/14/2017 10:29      Assessment & Plan: Pt is a 72 y.o. male ith a PMHx of coronary artery disease, chronic systolic heart failure ejection fraction 20 to 25%, colon cancer  status post colectomy followed by chemotherapy, hypertension, chronic kidney disease stage II, who was admitted to [facility] on 08/14/2017 for evaluation of weakness.  1.  Acute renal failure/chronic kidney disease stage III baseline creatinine 1.3.  Suspect acute renal failure secondary to dehydration from suspected UTI as well as being on lisinopril and Lasix in a volume depleted state.  Renal ultrasound was performed and there was a suggestion of mild hydronephrosis in the left kidney.  We will obtain CT scan of the abdomen and pelvis for further evaluation.  Continue IV fluid hydration.  We also plan to check SPEP, UPEP, ANA, ANCA antibodies, GBM  antibodies, C3, C4 for further work-up.  No indication for dialysis at the moment.   2.  Suspected urinary tract infection.  She had hematuria as well as pyuria.  He has been started on ceftriaxone 1 g IV daily.  3.  Anemia chronic kidney disease.  Hemoglobin currently down to 9.5.  Hold off on Epogen for now.  4.  Left upper pole renal mass.  We will obtain CT scan of the abdomen pelvis without contrast for further evaluation.  We may need to consider urology consultation as well.

## 2017-08-15 NOTE — Progress Notes (Signed)
[place] [facility] [place] NAME: [name]    MR#:  [id number]  DATE OF BIRTH:  05/24/1945  SUBJECTIVE:  CHIEF COMPLAINT:   Chief Complaint  Patient presents with  . Weakness   No complaints. REVIEW OF SYSTEMS:  Review of Systems  Constitutional: Negative for chills, fever and malaise/fatigue.  HENT: Negative for sore throat.   Eyes: Negative for blurred vision and double vision.  Respiratory: Negative for cough, hemoptysis, shortness of breath, wheezing and stridor.   Cardiovascular: Negative for chest pain, palpitations, orthopnea and leg swelling.  Gastrointestinal: Negative for abdominal pain, blood in stool, diarrhea, melena, nausea and vomiting.  Genitourinary: Negative for dysuria, flank pain and hematuria.  Musculoskeletal: Negative for back pain and joint pain.  Skin: Negative for rash.  Neurological: Negative for dizziness, sensory change, focal weakness, seizures, loss of consciousness, weakness and headaches.  Endo/Heme/Allergies: Negative for polydipsia.  Psychiatric/Behavioral: Negative for depression. The patient is not nervous/anxious.     DRUG ALLERGIES:   Allergies  Allergen Reactions  . Shellfish Allergy Other (See Comments)    Pt. instructed by MD to avoid seafood   VITALS:  Blood pressure (!) 106/53, pulse 78, temperature 98.8 F (37.1 C), temperature source Oral, resp. rate 20, height 5\' 9"  (1.753 m), weight 177 lb 5 oz (80.4 kg), SpO2 95 %. PHYSICAL EXAMINATION:  Physical Exam  Constitutional: He is oriented to person, place, and time.  HENT:  Head: Normocephalic.  Mouth/Throat: Oropharynx is clear and moist.  Eyes: Pupils are equal, round, and reactive to light. Conjunctivae and EOM are normal. No scleral icterus.  Neck: Normal range of motion. Neck supple. No JVD present. No tracheal deviation present.  Cardiovascular: Normal rate, regular rhythm and normal heart sounds. Exam reveals no gallop.  No  murmur heard. Pulmonary/Chest: Effort normal and breath sounds normal. No respiratory distress. He has no wheezes. He has no rales.  Abdominal: Soft. Bowel sounds are normal. He exhibits no distension. There is no tenderness. There is no rebound.  Musculoskeletal: Normal range of motion. He exhibits no edema or tenderness.  Neurological: He is alert and oriented to person, place, and time. No cranial nerve deficit.  Skin: No rash noted. No erythema.  Psychiatric: He has a normal mood and affect.   LABORATORY PANEL:  Male CBC Recent Labs  Lab 08/15/17 0451  WBC 8.4  HGB 9.5*  HCT 28.2*  PLT 141*   ------------------------------------------------------------------------------------------------------------------ Chemistries  Recent Labs  Lab 08/14/17 1003 08/15/17 0451  NA 137 138  K 4.8 4.6  CL 103 109  CO2 22 21*  GLUCOSE 224* 238*  BUN 45* 45*  CREATININE 3.16* 3.26*  CALCIUM 8.6* 7.7*  AST 35  --   ALT 41  --   ALKPHOS 101  --   BILITOT 2.0*  --    RADIOLOGY:  No results found. ASSESSMENT AND PLAN:   72 year old male with past medical history of hypertension, hyperlipidemia, GERD, history of chronic systolic CHF, coronary artery disease status post bypass, history of colon cancer status post colectomy with postoperative chemo who presents to the hospital due to generalized weakness, abdominal cramping and noted to be in acute kidney injury and also noted to have urinary tract infection.  1.  Acute renal failure-secondary to the underlying UTI and also dehydration. Continue IV fluids, follow BUN and creatinine urine output.  Hold lisinopril.  Hold Aldactone. Renal ultrasound: No obstruction but does show left-sided renal mass. Nephrology consult for  renal failure and renal mass, follow-up BMP and MRI.  2.  Urinary tract infection Continue IV ceftriaxone, follow urine culture.  3.  History of chronic systolic CHF LV EF: 56% -   40% Stable.  Given the acute  kidney injury, hold lisinopril, Lasix, Aldactone.  Hold carvedilol due to low side blood pressure.  4.  Neuropathy-continue gabapentin.  5.  Hyperlipidemia-continue atorvastatin.  6.  Essential hypertension-hold Norvasc, carvedilol due to low side blood pressure.  7.  GERD-continue Protonix.  *Mild hyperglycemia without history of diabetes. Start sliding scale and check hemoglobin A1c.  All the records are reviewed and case discussed with Care Management/Social Worker. Management plans discussed with the patient, family and they are in agreement.  CODE STATUS: Full Code  TOTAL TIME TAKING CARE OF THIS PATIENT: 36 minutes.   More than 50% of the time was spent in counseling/coordination of care: YES  POSSIBLE D/C IN 2 DAYS, DEPENDING ON CLINICAL CONDITION.   Demetrios Loll M.D on 08/15/2017 at 3:53 PM  Between 7am to 6pm - Pager - [phone]  After 6pm go to [email or web address] - [facility]

## 2017-08-15 NOTE — Progress Notes (Signed)
Inpatient Diabetes Program Recommendations  AACE/ADA: New Consensus Statement on Inpatient Glycemic Control (2019)  Target Ranges:  Prepandial:   less than 140 mg/dL      Peak postprandial:   less than 180 mg/dL (1-2 hours)      Critically ill patients:  140 - 180 mg/dL   Results for [name], [name] (MRN [id number]) as of 08/15/2017 11:10  Ref. Range 08/02/2016 11:10 08/24/2016 14:15 01/09/2017 13:17 08/14/2017 10:03 08/15/2017 04:51  Glucose Latest Ref Range: 70 - 99 mg/dL 137 (H) 230 (H) 181 (H) 224 (H) 238 (H)   Review of Glycemic Control  Diabetes history: No Outpatient Diabetes medications: NA Current orders for Inpatient glycemic control: None  Inpatient Diabetes Program Recommendations: Correction (SSI): While inpatient please consider ordering CBGs with Novolog 0-9 units TID with meals and Novolog 0-5 units QHS. HgbA1C: Please consider ordering an A1C to evaluate glycemic control over the past 2-3 months. However, please note that hemoglobin is low so A1C will not be completely accurate.   Thanks, Barnie Alderman, RN, MSN, CDE Diabetes Coordinator Inpatient Diabetes Program [phone] (Team Pager from 8am to 5pm)

## 2017-08-16 ENCOUNTER — Inpatient Hospital Stay: Payer: PPO

## 2017-08-16 LAB — URINALYSIS, COMPLETE (UACMP) WITH MICROSCOPIC
BILIRUBIN URINE: NEGATIVE
GLUCOSE, UA: NEGATIVE mg/dL
KETONES UR: 5 mg/dL — AB
NITRITE: NEGATIVE
PH: 5 (ref 5.0–8.0)
Protein, ur: 30 mg/dL — AB
Specific Gravity, Urine: 1.018 (ref 1.005–1.030)

## 2017-08-16 LAB — HIV ANTIBODY (ROUTINE TESTING W REFLEX): HIV SCREEN 4TH GENERATION: NONREACTIVE

## 2017-08-16 LAB — BASIC METABOLIC PANEL
ANION GAP: 10 (ref 5–15)
BUN: 41 mg/dL — ABNORMAL HIGH (ref 8–23)
CALCIUM: 7.8 mg/dL — AB (ref 8.9–10.3)
CHLORIDE: 108 mmol/L (ref 98–111)
CO2: 20 mmol/L — AB (ref 22–32)
Creatinine, Ser: 3.06 mg/dL — ABNORMAL HIGH (ref 0.61–1.24)
GFR calc non Af Amer: 19 mL/min — ABNORMAL LOW (ref >60)
GFR, EST AFRICAN AMERICAN: 22 mL/min — AB (ref >60)
Glucose, Bld: 168 mg/dL — ABNORMAL HIGH (ref 70–99)
Potassium: 3.7 mmol/L (ref 3.5–5.1)
Sodium: 138 mmol/L (ref 135–145)

## 2017-08-16 LAB — GLUCOSE, CAPILLARY
GLUCOSE-CAPILLARY: 174 mg/dL — AB (ref 70–99)
Glucose-Capillary: 178 mg/dL — ABNORMAL HIGH (ref 70–99)
Glucose-Capillary: 249 mg/dL — ABNORMAL HIGH (ref 70–99)
Glucose-Capillary: 168 mg/dL — ABNORMAL HIGH (ref 70–99)

## 2017-08-16 MED ORDER — LIVING WELL WITH DIABETES BOOK
Freq: Once | Status: AC
  Administered 2017-08-16: 15:00:00
  Filled 2017-08-16: qty 1

## 2017-08-16 MED ORDER — INSULIN GLARGINE 100 UNIT/ML ~~LOC~~ SOLN
8.0000 [IU] | Freq: Every day | SUBCUTANEOUS | Status: DC
Start: 2017-08-16 — End: 2017-08-17
  Administered 2017-08-16: 23:00:00 8 [IU] via SUBCUTANEOUS
  Filled 2017-08-16 (×2): qty 0.08

## 2017-08-16 NOTE — Progress Notes (Addendum)
Inpatient Diabetes Program Recommendations  AACE/ADA: New Consensus Statement on Inpatient Glycemic Control (2019)  Target Ranges:  Prepandial:   less than 140 mg/dL      Peak postprandial:   less than 180 mg/dL (1-2 hours)      Critically ill patients:  140 - 180 mg/dL   Results for [name], [name] (MRN [id number]) as of 08/16/2017 10:37  Ref. Range 08/15/2017 11:41 08/15/2017 17:03 08/15/2017 21:19 08/15/2017 23:30 08/16/2017 07:43  Glucose-Capillary Latest Ref Range: 70 - 99 mg/dL 225 (H) 167 (H) 193 (H) 182 (H) 178 (H)  Results for [name], [name] (MRN [id number]) as of 08/16/2017 10:37  Ref. Range 08/14/2017 10:03 08/15/2017 04:51 08/16/2017 04:19  Glucose Latest Ref Range: 70 - 99 mg/dL 224 (H) 238 (H) 168 (H)  Hemoglobin A1C Latest Ref Range: 4.8 - 5.6 %  7.8 (H)    Review of Glycemic Control  Diabetes history: No Outpatient Diabetes medications: NA Current orders for Inpatient glycemic control: Novolog 0-9 units TID with meals, Novolog 0-5 units QHS  Inpatient Diabetes Program Recommendations: HgbA1C: A1C 7.8% on 08/15/17 indicating an average glucose of 177 mg/dl. Per ADA, if A1C 6.5% or greater then criteria met to dx wtih DM. MD, please indicate in note if patient will be dx with DM at this time. If so, please inform patient and bedside nursing staff so patient can be educated while inpatient.  NOTE: No prior DM history noted in chart. Initial lab glucose 224 mg/dl on 08/14/17 and A1C 7.8% on 08/15/17. Also noted, hemoglobin 9.5 g/dL on 08/15/17 when A1C was drawn. Glucose ranged from 167-225 mg/dl since CBGs started being checked yesterday. Per ADA, if A1C 6.5% or greater then criteria met to dx wtih DM. Despite low hemoglobin when A1C was drawn, based on glucose trends anticipate patient has DM. If MD plans to dx patient with new DM dx, please inform patient and bedside nursing staff so that patient can be educated while inpatient.   Addendum 08/16/17@13 :15-Spoke with patient about new  diabetes diagnosis. Patient reports that he has never been told he had DM or pre-DM in the past. Patient has not seen PCP in over 6 months.  Discussed A1C results (7.8% on 08/15/17) and explained what an A1C is and informed patient that his current A1C indicates an average glucose of 177 mg/dl over the past 2-3 months. Discussed basic pathophysiology of DM Type 2, basic home care, importance of checking CBGs and maintaining good CBG control to prevent long-term and short-term complications. Reviewed glucose and A1C goals and explained that the doctor who discharges him will let him know how often he should monitor glucose and which DM medications he will need to take at home. Reviewed signs and symptoms of hyperglycemia and hypoglycemia along with treatment for both. Discussed impact of nutrition, exercise, stress, sickness, and medications on diabetes control. Patient states that he had not received any steroids within the past 3 months that he is aware of. Informed patient the Living Well with diabetes booklet would be ordered. Patient states he can not read well but he will have his family to review the book with him. Informed patient he will likely be discharged on an oral DM medication as his A1C was only 7.8%.  Asked patient to check his glucose as MD directs him and to keep a log book of glucose readings.  Explained how the doctor he follows up with can use the log book to continue to make adjustments with DM medications  if needed. Informed patient that RD consult would be ordered for further diet education while inpatient. Asked patient to call PCP office and make an appointment for one day next week for follow up regarding new DM dx.  Patient verbalized understanding of information discussed and he states that he has no further questions at this time related to diabetes.   RNs to provide ongoing basic DM education at bedside with this patient and engage patient to actively check blood glucose.    Thanks, Barnie Alderman, RN, MSN, CDE Diabetes Coordinator Inpatient Diabetes Program [phone] (Team Pager from 8am to 5pm)

## 2017-08-16 NOTE — Plan of Care (Signed)
  RD consulted for nutrition education regarding diabetes.   Lab Results  Component Value Date   HGBA1C 7.8 (H) 08/15/2017    RD provided "Carbohydrate Counting for People with Diabetes" handout from the Academy of Nutrition and Dietetics. Discussed different food groups and their effects on blood sugar, emphasizing carbohydrate-containing foods. Provided list of carbohydrates and recommended serving sizes of common foods.  Discussed importance of controlled and consistent carbohydrate intake throughout the day. Provided examples of ways to balance meals/snacks and encouraged intake of high-fiber, whole grain complex carbohydrates. Also discussed plate method.Teach back method used.  Expect fair compliance.  Body mass index is 26.18 kg/m. Pt meets criteria for overweight based on current BMI.  Current diet order is heart healthy, patient is consuming approximately 25-100% of meals at this time. Labs and medications reviewed. No further nutrition interventions warranted at this time. RD contact information provided. If additional nutrition issues arise, please re-consult RD.  Satira Anis. Gicela Schwarting, MS, RD LDN Inpatient Clinical Dietitian Pager [phone]

## 2017-08-16 NOTE — Progress Notes (Signed)
[facility]  ROUNDING NOTE   Subjective:  Renal function slightly improved. Creatinine currently 3.06. CT scan of the abdomen and pelvis as well as MRI of the abdomen have been reviewed. Patient has abnormal appearance of the left kidney which could represent pyelonephritis versus pyelonephritis. Urology consultation has been placed.   Objective:  Vital signs in last 24 hours:  Temp:  [98.8 F (37.1 C)-102.6 F (39.2 C)] 99.6 F (37.6 C) (07/12 0645) Pulse Rate:  [78-113] 100 (07/12 0426) Resp:  [18-20] 18 (07/12 0426) BP: (106-159)/(53-72) 133/61 (07/12 0426) SpO2:  [95 %-98 %] 97 % (07/12 0426)  Weight change:  Filed Weights   08/14/17 0934 08/14/17 1352  Weight: 81.6 kg (180 lb) 80.4 kg (177 lb 5 oz)    Intake/Output: I/O last 3 completed shifts: In: 89 [P.O.:480; I.V.:3960] Out: 208 [Urine:204; Stool:4]   Intake/Output this shift:  Total I/O In: 120 [P.O.:120] Out: 100 [Urine:100]  Physical Exam: General: No acute distress  Head: Normocephalic, atraumatic. Moist oral mucosal membranes  Eyes: Anicteric  Neck: Supple, trachea midline  Lungs:  Clear to auscultation, normal effort  Heart: S1S2 no rubs  Abdomen:  Soft, nontender, bowel sounds present  Extremities: No peripheral edema.  Neurologic: Awake, alert, following commands  Skin: No lesions       Basic Metabolic Panel: Recent Labs  Lab 08/14/17 1003 08/15/17 0451 08/16/17 0419  NA 137 138 138  K 4.8 4.6 3.7  CL 103 109 108  CO2 22 21* 20*  GLUCOSE 224* 238* 168*  BUN 45* 45* 41*  CREATININE 3.16* 3.26* 3.06*  CALCIUM 8.6* 7.7* 7.8*    Liver Function Tests: Recent Labs  Lab 08/14/17 1003  AST 35  ALT 41  ALKPHOS 101  BILITOT 2.0*  PROT 8.1  ALBUMIN 2.9*   Recent Labs  Lab 08/14/17 1003  LIPASE 39   No results for input(s): AMMONIA in the last 168 hours.  CBC: Recent Labs  Lab 08/14/17 1003 08/15/17 0451  WBC 10.9* 8.4  HGB 11.2* 9.5*  HCT 33.1* 28.2*   MCV 94.4 94.0  PLT 159 141*    Cardiac Enzymes: No results for input(s): CKTOTAL, CKMB, CKMBINDEX, TROPONINI in the last 168 hours.  BNP: Invalid input(s): POCBNP  CBG: Recent Labs  Lab 08/15/17 1703 08/15/17 2119 08/15/17 2330 08/16/17 0743 08/16/17 1146  GLUCAP 167* 193* 182* 178* 249*    Microbiology: Results for orders placed or performed during the hospital encounter of 08/14/17  Urine Culture     Status: Abnormal   Collection Time: 08/14/17 10:56 AM  Result Value Ref Range Status   Specimen Description   Final    URINE, RANDOM Performed at [facility], [address], [name], [place] [address]    Special Requests   Final    NONE Performed at [facility], [address] [place],  [address]    Culture MULTIPLE SPECIES PRESENT, SUGGEST RECOLLECTION (A)  Final   Report Status 08/15/2017 FINAL  Final  CULTURE, BLOOD (ROUTINE X 2) w Reflex to ID Panel     Status: None (Preliminary result)   Collection Time: 08/14/17  9:23 PM  Result Value Ref Range Status   Specimen Description BLOOD BLOOD RIGHT WRIST  Final   Special Requests   Final    BOTTLES DRAWN AEROBIC AND ANAEROBIC Blood Culture adequate volume   Culture   Final    NO GROWTH 2 DAYS Performed at [facility], [address] [place],  [place] [address]    Report Status PENDING  Incomplete  CULTURE, BLOOD (ROUTINE X 2) w Reflex to ID Panel     Status: None (Preliminary result)   Collection Time: 08/14/17  9:23 PM  Result Value Ref Range Status   Specimen Description BLOOD BLOOD LEFT WRIST  Final   Special Requests   Final    BOTTLES DRAWN AEROBIC AND ANAEROBIC Blood Culture adequate volume   Culture   Final    NO GROWTH 2 DAYS Performed at [facility], [address] [place], [place] [address]    Report Status PENDING  Incomplete    Coagulation Studies: No results for input(s): LABPROT, INR in the last 72 hours.  Urinalysis: Recent Labs     08/14/17 1056 08/16/17 0937  COLORURINE AMBER* AMBER*  LABSPEC 1.017 1.018  PHURINE 5.0 5.0  GLUCOSEU NEGATIVE NEGATIVE  HGBUR MODERATE* MODERATE*  BILIRUBINUR NEGATIVE NEGATIVE  KETONESUR 5* 5*  PROTEINUR 30* 30*  NITRITE NEGATIVE NEGATIVE  LEUKOCYTESUR MODERATE* LARGE*      Imaging: Ct Abdomen Pelvis Wo Contrast  Result Date: 08/15/2017 CLINICAL DATA:  72 year old male with diagnosis of acute renal failure and UTI. Possible renal mass was seen on the earlier ultrasound. EXAM: CT ABDOMEN AND PELVIS WITHOUT CONTRAST TECHNIQUE: Multidetector CT imaging of the abdomen and pelvis was performed following the standard protocol without IV contrast. COMPARISON:  Renal ultrasound dated 08/14/2017 and CT of the abdomen pelvis dated 01/12/2016. FINDINGS: Evaluation of this exam is limited in the absence of intravenous contrast. Lower chest: Partially visualized trace bilateral pleural effusions. There is subsegmental bibasilar densities which may represent atelectatic changes although infiltrate is not entirely excluded. Clinical correlation is recommended. There is borderline cardiomegaly. There is hypoattenuation of the cardiac blood pool suggestive of a degree of anemia. Clinical correlation is recommended. No intra-abdominal free air or free fluid. Hepatobiliary: The liver is grossly unremarkable on this noncontrast CT. No biliary ductal dilatation. There is probable small stones in the neck of the gallbladder. No pericholecystic fluid. Pancreas: Unremarkable. No pancreatic ductal dilatation or surrounding inflammatory changes. Spleen: The spleen is grossly unremarkable on this noncontrast CT. The described lesion seen on the earlier ultrasound is not well visualized in the absence of contrast. Adrenals/Urinary Tract: The adrenal glands are unremarkable. There are bilateral renal hypodense lesions measuring up to 2 cm in the inferior pole of the right kidney. These lesions are not characterized on this  unenhanced CT. The left renal upper pole lesion described on the earlier ultrasound is not well visualized or evaluated on this noncontrast CT. Further characterization with dedicated CT or MRI using renal mass protocol is recommended. There is mild left hydronephrosis as well as mild left hydroureter. A transition zone is seen in the distal left ureter. No definite obstructing stone identified. Although this may be related to a recently passed left renal calculus, an underlying urothelial lesion/neoplasm is not excluded. Further evaluation with dedicated CT urography is recommended. There is no hydronephrosis or nephrolithiasis on the right. The urinary bladder is predominantly collapsed. Stomach/Bowel: There is a small hiatal hernia with gastroesophageal reflux. Oral contrast opacifies the stomach and multiple loops of small bowel and traverses into the colon. There is no bowel obstruction or active inflammation. Appendectomy. Vascular/Lymphatic: There is moderate aortoiliac atherosclerotic disease. The abdominal aorta and IVC are otherwise grossly unremarkable on this noncontrast CT. No portal venous gas. There is no adenopathy. Reproductive: The prostate and seminal vesicles are grossly unremarkable as visualized. Other: Partially visualized left  hydrocele. There is a midline vertical anterior pelvic wall incisional scar. Musculoskeletal: Degenerative changes of the spine. No acute osseous pathology. IMPRESSION: 1. Mild left hydronephroureter. No obstructing stone identified. Underlying distal left ureteral lesion is not entirely excluded. Further evaluation with dedicated CT urography recommended. 2. Bilateral renal hypodense lesions incompletely evaluated or characterized on this unenhanced CT. 3.  Aortic Atherosclerosis (ICD10-I70.0). Electronically Signed   By: Anner Crete M.D.   On: 08/15/2017 22:12   Mr Abdomen Wo Contrast  Result Date: 08/16/2017 CLINICAL DATA:  Weakness and abdominal cramping.  Acute renal failure. Colon cancer with prior colectomy. Coronary artery disease with prior bypass. Left renal mass. EXAM: MRI ABDOMEN WITHOUT CONTRAST TECHNIQUE: Multiplanar multisequence MR imaging was performed without the administration of intravenous contrast. Contrast could not be administered, the patient's GFR is 21. COMPARISON:  08/15/2017 FINDINGS: Despite efforts by the technologist and patient, motion artifact is present on today's exam and could not be eliminated. This reduces exam sensitivity and specificity. This is a common result when abdominal MRI is attempted in the inpatient setting where patients are less likely to be able to breath hold and cooperate in controlling motion. Lower chest: Trace bilateral pleural effusions.  Mild cardiomegaly. Hepatobiliary: Sludge is present in the gallbladder along with a suspected 1.5 cm gallstone on image 21/10. The liver appears otherwise unremarkable where included. Pancreas:  Unremarkable Spleen: The spleen measures 13.3 by 5.3 by 11.2 cm (volume = 410 cm^3). There are approximately 10 small foci of T2 signal hyperintensity in the spleen which are technically nonspecific, but which appear to of been present on prior exams such as 01/12/2016 and 06/19/2013, without significant progression. Adrenals/Urinary Tract: Expanded size the left kidney with suspected diffuse edema/infiltration based on accentuated T2 signal and striated appearance of the kidney for example on image 10/5. Left hydroureter and mild hydronephrosis. The striated appearance of the left kidney appears disproportionate to the degree of hydronephrosis. Heterogeneous T2 signal in the left kidney. Bilateral fluid signal intensity lesions in the right kidney appear roughly stable compared back to cysts seen on 06/19/2013. Left kidney lower pole cyst on image 16/5 likewise similar to remote prior exams. A 2.0 by 1.4 cm lesion anteriorly in the left kidney lower pole on image 15/5 is nonspecific.  There is some heterogeneity of the fluid in the left collecting system, for example on image 17/4. Stomach/Bowel: Unremarkable Vascular/Lymphatic:  Aortoiliac atherosclerotic vascular disease. Other: Mild bilateral perirenal stranding, left greater than right, with edema tracking along the retroperitoneum and paraspinal musculature bilaterally. Some low-level edema tracking along the bilateral flank musculature is well. Musculoskeletal: Unremarkable IMPRESSION: 1. Abnormal expansion of the left kidney with striated appearance suggesting diffuse edema/infiltration. Mildly asymmetric left perirenal stranding. Heterogeneous debris in the left collecting system/dilated proximal left ureter. The striated appearance is disproportionate to the degree of hydronephrosis, and I am suspicious for pyelonephritis and possibly pyonephrosis. Infiltrating renal malignancy is possible but less likely. No expansion of the left renal vein to suggest renal vein thrombosis as a alternative etiology. 2. Bilateral renal cysts. A lesion anteriorly in the left kidney lower pole is technically nonspecific and clearly complex. This could be a complex cyst or mass. 3. Cholelithiasis with sludge in the gallbladder. 4. Mild cardiomegaly with trace bilateral pleural effusions. 5. Scattered small T2 signal hyperintensities in the spleen, nonspecific but stable from 06/19/2013 hence probably benign. 6.  Aortic Atherosclerosis (ICD10-I70.0). Electronically Signed   By: Van Clines M.D.   On: 08/16/2017 09:00   US  Renal  Result Date: 08/14/2017 CLINICAL DATA:  Acute renal failure, history of colon cancer. EXAM: RENAL / URINARY TRACT ULTRASOUND COMPLETE COMPARISON:  CT 01/12/2016 FINDINGS: Right Kidney: Length: 10.3 cm. Two adjacent cysts are redemonstrated in the lower pole the right kidney 1 slightly exophytic measuring 1.1 x 1.1 x 1 cm, similar in size to prior CT. Adjacent more cortically based cyst measures 2.1 x 2.2 x 2.8 cm also  similar in size. No nephrolithiasis nor hydronephrosis. Cortical-medullary distinction is maintained. Left Kidney: Length: 11.8 cm. Echogenicity within normal limits. Heterogeneous appearance of the upper pole of the left kidney with subtle masslike abnormality suggested measuring 2.6 x 1.7 x 2.1 cm. Dedicated renal protocol CT or MRI is recommended without and with IV contrast. Mild dilatation of the intrarenal collecting system compatible with mild hydronephrosis or ectasia is noted. A cyst similar in size to prior CT is noted in the lower pole measuring 1.5 x 1.5 x 1.6 cm Bladder: Appears normal for degree of bladder distention. Other: At least 4 subcentimeter rounded nonshadowing echogenic lesions are identified of the normal sized spleen more commonly associated with hemangiomata given their echogenicity. No worrisome features are noted. No additional workup required per consensus guidelines. IMPRESSION: 1. A 2.6 x 1.7 x 2.1 cm solid masslike abnormality suggested of the upper pole the left kidney for which dedicated CT or MRI using renal protocol is recommended for further characterization. 2. Stable bilateral renal cysts are otherwise noted. There is mild fullness of left renal collecting system that may represent mild hydronephrosis. 3. Rounded echogenic nonshadowing subcentimeter lesions of the spleen more commonly associated with hemangiomata. No additional workup required given nonaggressive features. Electronically Signed   By: Ashley Royalty M.D.   On: 08/14/2017 14:02     Medications:   . sodium chloride 100 mL/hr at 08/16/17 0748  . cefTRIAXone (ROCEPHIN)  IV Stopped (08/15/17 1739)   . aspirin EC  81 mg Oral Daily  . atorvastatin  40 mg Oral Daily  . docusate sodium  100 mg Oral Daily  . gabapentin  100 mg Oral TID  . heparin  5,000 Units Subcutaneous Q8H  . insulin aspart  0-5 Units Subcutaneous QHS  . insulin aspart  0-9 Units Subcutaneous TID WC  . pantoprazole  40 mg Oral QAC  breakfast  . polyethylene glycol  17 g Oral Daily   acetaminophen **OR** acetaminophen, bisacodyl, ondansetron **OR** ondansetron (ZOFRAN) IV  Assessment/ Plan:  72 y.o. male ith a PMHx of coronary artery disease, chronic systolic heart failure ejection fraction 20 to 25%, colon cancer status post colectomy followed by chemotherapy, hypertension, chronic kidney disease stage II, who was admitted to [facility] on 08/14/2017 for evaluation of weakness.  1.  Acute renal failure/chronic kidney disease stage III baseline creatinine 1.3.  Suspect acute renal failure secondary to dehydration from suspected UTI as well as being on lisinopril and Lasix in a volume depleted state.   -Renal function about the same.  Creatinine currently 3.06.  Continue IV fluid hydration for now.  Await urology input regarding left-sided hydronephrosis as well as suspected left-sided pyelonephritis versus pyelonephritis.   2.  Suspected urinary tract infection.    Continue ceftriaxone 1 g IV daily.  3.  Anemia chronic kidney disease.  Hemoglobin at last check was 9.5.  Continue to monitor CBC.  4.  Left upper pole renal mass vs left sided pyelonephritis/left hydroureter and mild hydronephrosis.  -CT abdomen and pelvis as well as MRI of the abdomen were both  reviewed.  It appears that patient has mild left-sided hydronephrosis as well as left hydroureter.  There is also question of pyelonephritis versus pyelonephritis.  We have placed urology consultation for further evaluation.    LOS: 2 [name] 7/12/201911:55 AM

## 2017-08-16 NOTE — Progress Notes (Signed)
Pt alert and oriented, 2-3 large BM's post the oral contrast for CT of abdomen, temp 102.6, decreased with tylenol, no c/o pain nor distress noted. MRI of abdomen this shift awaiting for results. Continue to monitor.

## 2017-08-16 NOTE — Progress Notes (Addendum)
[facility] NAME: [name]    MR#:  [id number]  DATE OF BIRTH:  1946/01/27  SUBJECTIVE:  CHIEF COMPLAINT:   Chief Complaint  Patient presents with  . Weakness   He had a fever 102.6 early this morning. REVIEW OF SYSTEMS:  Review of Systems  Constitutional: Negative for chills, fever and malaise/fatigue.  HENT: Negative for sore throat.   Eyes: Negative for blurred vision and double vision.  Respiratory: Negative for cough, hemoptysis, shortness of breath, wheezing and stridor.   Cardiovascular: Negative for chest pain, palpitations, orthopnea and leg swelling.  Gastrointestinal: Negative for abdominal pain, blood in stool, diarrhea, melena, nausea and vomiting.  Genitourinary: Negative for dysuria, flank pain and hematuria.  Musculoskeletal: Negative for back pain and joint pain.  Skin: Negative for rash.  Neurological: Negative for dizziness, sensory change, focal weakness, seizures, loss of consciousness, weakness and headaches.  Endo/Heme/Allergies: Negative for polydipsia.  Psychiatric/Behavioral: Negative for depression. The patient is not nervous/anxious.     DRUG ALLERGIES:   Allergies  Allergen Reactions  . Shellfish Allergy Other (See Comments)    Pt. instructed by MD to avoid seafood   VITALS:  Blood pressure 140/78, pulse 82, temperature (!) 97.4 F (36.3 C), temperature source Oral, resp. rate 20, height 5\' 9"  (1.753 m), weight 177 lb 5 oz (80.4 kg), SpO2 98 %. PHYSICAL EXAMINATION:  Physical Exam  Constitutional: He is oriented to person, place, and time.  HENT:  Head: Normocephalic.  Mouth/Throat: Oropharynx is clear and moist.  Eyes: Pupils are equal, round, and reactive to light. Conjunctivae and EOM are normal. No scleral icterus.  Neck: Normal range of motion. Neck supple. No JVD present. No tracheal deviation present.  Cardiovascular: Normal rate, regular rhythm and normal heart sounds. Exam  reveals no gallop.  No murmur heard. Pulmonary/Chest: Effort normal and breath sounds normal. No respiratory distress. He has no wheezes. He has no rales.  Abdominal: Soft. Bowel sounds are normal. He exhibits no distension. There is no tenderness. There is no rebound.  Musculoskeletal: Normal range of motion. He exhibits no edema or tenderness.  Neurological: He is alert and oriented to person, place, and time. No cranial nerve deficit.  Skin: No rash noted. No erythema.  Psychiatric: He has a normal mood and affect.   LABORATORY PANEL:  Male CBC Recent Labs  Lab 08/15/17 0451  WBC 8.4  HGB 9.5*  HCT 28.2*  PLT 141*   ------------------------------------------------------------------------------------------------------------------ Chemistries  Recent Labs  Lab 08/14/17 1003  08/16/17 0419  NA 137   < > 138  K 4.8   < > 3.7  CL 103   < > 108  CO2 22   < > 20*  GLUCOSE 224*   < > 168*  BUN 45*   < > 41*  CREATININE 3.16*   < > 3.06*  CALCIUM 8.6*   < > 7.8*  AST 35  --   --   ALT 41  --   --   ALKPHOS 101  --   --   BILITOT 2.0*  --   --    < > = values in this interval not displayed.   RADIOLOGY:  Ct Abdomen Pelvis Wo Contrast  Result Date: 08/15/2017 CLINICAL DATA:  72 year old male with diagnosis of acute renal failure and UTI. Possible renal mass was seen on the earlier ultrasound. EXAM: CT ABDOMEN AND PELVIS WITHOUT CONTRAST TECHNIQUE: Multidetector CT imaging of the abdomen  and pelvis was performed following the standard protocol without IV contrast. COMPARISON:  Renal ultrasound dated 08/14/2017 and CT of the abdomen pelvis dated 01/12/2016. FINDINGS: Evaluation of this exam is limited in the absence of intravenous contrast. Lower chest: Partially visualized trace bilateral pleural effusions. There is subsegmental bibasilar densities which may represent atelectatic changes although infiltrate is not entirely excluded. Clinical correlation is recommended. There is  borderline cardiomegaly. There is hypoattenuation of the cardiac blood pool suggestive of a degree of anemia. Clinical correlation is recommended. No intra-abdominal free air or free fluid. Hepatobiliary: The liver is grossly unremarkable on this noncontrast CT. No biliary ductal dilatation. There is probable small stones in the neck of the gallbladder. No pericholecystic fluid. Pancreas: Unremarkable. No pancreatic ductal dilatation or surrounding inflammatory changes. Spleen: The spleen is grossly unremarkable on this noncontrast CT. The described lesion seen on the earlier ultrasound is not well visualized in the absence of contrast. Adrenals/Urinary Tract: The adrenal glands are unremarkable. There are bilateral renal hypodense lesions measuring up to 2 cm in the inferior pole of the right kidney. These lesions are not characterized on this unenhanced CT. The left renal upper pole lesion described on the earlier ultrasound is not well visualized or evaluated on this noncontrast CT. Further characterization with dedicated CT or MRI using renal mass protocol is recommended. There is mild left hydronephrosis as well as mild left hydroureter. A transition zone is seen in the distal left ureter. No definite obstructing stone identified. Although this may be related to a recently passed left renal calculus, an underlying urothelial lesion/neoplasm is not excluded. Further evaluation with dedicated CT urography is recommended. There is no hydronephrosis or nephrolithiasis on the right. The urinary bladder is predominantly collapsed. Stomach/Bowel: There is a small hiatal hernia with gastroesophageal reflux. Oral contrast opacifies the stomach and multiple loops of small bowel and traverses into the colon. There is no bowel obstruction or active inflammation. Appendectomy. Vascular/Lymphatic: There is moderate aortoiliac atherosclerotic disease. The abdominal aorta and IVC are otherwise grossly unremarkable on this  noncontrast CT. No portal venous gas. There is no adenopathy. Reproductive: The prostate and seminal vesicles are grossly unremarkable as visualized. Other: Partially visualized left hydrocele. There is a midline vertical anterior pelvic wall incisional scar. Musculoskeletal: Degenerative changes of the spine. No acute osseous pathology. IMPRESSION: 1. Mild left hydronephroureter. No obstructing stone identified. Underlying distal left ureteral lesion is not entirely excluded. Further evaluation with dedicated CT urography recommended. 2. Bilateral renal hypodense lesions incompletely evaluated or characterized on this unenhanced CT. 3.  Aortic Atherosclerosis (ICD10-I70.0). Electronically Signed   By: Anner Crete M.D.   On: 08/15/2017 22:12   Mr Abdomen Wo Contrast  Result Date: 08/16/2017 CLINICAL DATA:  Weakness and abdominal cramping. Acute renal failure. Colon cancer with prior colectomy. Coronary artery disease with prior bypass. Left renal mass. EXAM: MRI ABDOMEN WITHOUT CONTRAST TECHNIQUE: Multiplanar multisequence MR imaging was performed without the administration of intravenous contrast. Contrast could not be administered, the patient's GFR is 21. COMPARISON:  08/15/2017 FINDINGS: Despite efforts by the technologist and patient, motion artifact is present on today's exam and could not be eliminated. This reduces exam sensitivity and specificity. This is a common result when abdominal MRI is attempted in the inpatient setting where patients are less likely to be able to breath hold and cooperate in controlling motion. Lower chest: Trace bilateral pleural effusions.  Mild cardiomegaly. Hepatobiliary: Sludge is present in the gallbladder along with a suspected 1.5 cm gallstone on  image 21/10. The liver appears otherwise unremarkable where included. Pancreas:  Unremarkable Spleen: The spleen measures 13.3 by 5.3 by 11.2 cm (volume = 410 cm^3). There are approximately 10 small foci of T2 signal  hyperintensity in the spleen which are technically nonspecific, but which appear to of been present on prior exams such as 01/12/2016 and 06/19/2013, without significant progression. Adrenals/Urinary Tract: Expanded size the left kidney with suspected diffuse edema/infiltration based on accentuated T2 signal and striated appearance of the kidney for example on image 10/5. Left hydroureter and mild hydronephrosis. The striated appearance of the left kidney appears disproportionate to the degree of hydronephrosis. Heterogeneous T2 signal in the left kidney. Bilateral fluid signal intensity lesions in the right kidney appear roughly stable compared back to cysts seen on 06/19/2013. Left kidney lower pole cyst on image 16/5 likewise similar to remote prior exams. A 2.0 by 1.4 cm lesion anteriorly in the left kidney lower pole on image 15/5 is nonspecific. There is some heterogeneity of the fluid in the left collecting system, for example on image 17/4. Stomach/Bowel: Unremarkable Vascular/Lymphatic:  Aortoiliac atherosclerotic vascular disease. Other: Mild bilateral perirenal stranding, left greater than right, with edema tracking along the retroperitoneum and paraspinal musculature bilaterally. Some low-level edema tracking along the bilateral flank musculature is well. Musculoskeletal: Unremarkable IMPRESSION: 1. Abnormal expansion of the left kidney with striated appearance suggesting diffuse edema/infiltration. Mildly asymmetric left perirenal stranding. Heterogeneous debris in the left collecting system/dilated proximal left ureter. The striated appearance is disproportionate to the degree of hydronephrosis, and I am suspicious for pyelonephritis and possibly pyonephrosis. Infiltrating renal malignancy is possible but less likely. No expansion of the left renal vein to suggest renal vein thrombosis as a alternative etiology. 2. Bilateral renal cysts. A lesion anteriorly in the left kidney lower pole is technically  nonspecific and clearly complex. This could be a complex cyst or mass. 3. Cholelithiasis with sludge in the gallbladder. 4. Mild cardiomegaly with trace bilateral pleural effusions. 5. Scattered small T2 signal hyperintensities in the spleen, nonspecific but stable from 06/19/2013 hence probably benign. 6.  Aortic Atherosclerosis (ICD10-I70.0). Electronically Signed   By: Van Clines M.D.   On: 08/16/2017 09:00   ASSESSMENT AND PLAN:   72 year old male with past medical history of hypertension, hyperlipidemia, GERD, history of chronic systolic CHF, coronary artery disease status post bypass, history of colon cancer status post colectomy with postoperative chemo who presents to the hospital due to generalized weakness, abdominal cramping and noted to be in acute kidney injury and also noted to have urinary tract infection.  1.  Acute renal failure-secondary to the underlying UTI, pyelonephritis and dehydration. Continue IV fluids, follow BUN and creatinine urine output.  Hold lisinopril.  Hold Aldactone. Renal ultrasound: No obstruction but does show left-sided renal mass. Per Abd MRI: suspicious for pyelonephritis and possibly pyonephrosis. Urology consult per Dr. Holley Raring.  2.  Urinary tract infection and pyelonephritis Continue IV ceftriaxone, follow blood culture and urine culture.  3.  History of chronic systolic CHF LV EF: 09% -   40% Stable.  Given the acute kidney injury, hold lisinopril, Lasix, Aldactone.  Hold carvedilol due to low side blood pressure. Resume Coreg since blood pressure is better.  4.  Neuropathy-continue gabapentin.  5.  Hyperlipidemia-continue atorvastatin.  6.  Essential hypertension-hold Norvasc, resume carvedilol. 7.  GERD-continue Protonix.  Anemia.  Unclear etiology.  Anemia work-up.  Follow-up hemoglobin. Hemoglobin decreased to 9.5, possible due to IV fluid dilution.  No active bleeding.  *Mild hyperglycemia  without history of diabetes.  New  diagnosis of diabetes 2. Started sliding scale and check hemoglobin A1c 7.8. Add lantus HS.  I discussed with Dr. Holley Raring. All the records are reviewed and case discussed with Care Management/Social Worker. Management plans discussed with the patient, family and they are in agreement.  CODE STATUS: Full Code  TOTAL TIME TAKING CARE OF THIS PATIENT: 32 minutes.   More than 50% of the time was spent in counseling/coordination of care: YES  POSSIBLE D/C IN 2 DAYS, DEPENDING ON CLINICAL CONDITION.   Demetrios Loll M.D on 08/16/2017 at 12:51 PM  Between 7am to 6pm - Pager - [phone]  After 6pm go to [email or web address] - [facility]

## 2017-08-17 DIAGNOSIS — N12 Tubulo-interstitial nephritis, not specified as acute or chronic: Secondary | ICD-10-CM

## 2017-08-17 DIAGNOSIS — N401 Enlarged prostate with lower urinary tract symptoms: Secondary | ICD-10-CM

## 2017-08-17 DIAGNOSIS — N1330 Unspecified hydronephrosis: Secondary | ICD-10-CM

## 2017-08-17 DIAGNOSIS — R1032 Left lower quadrant pain: Secondary | ICD-10-CM

## 2017-08-17 LAB — HEMOGLOBIN: HEMOGLOBIN: 9.3 g/dL — AB (ref 13.0–18.0)

## 2017-08-17 LAB — BASIC METABOLIC PANEL
ANION GAP: 7 (ref 5–15)
BUN: 33 mg/dL — ABNORMAL HIGH (ref 8–23)
CO2: 21 mmol/L — AB (ref 22–32)
Calcium: 7.5 mg/dL — ABNORMAL LOW (ref 8.9–10.3)
Chloride: 112 mmol/L — ABNORMAL HIGH (ref 98–111)
Creatinine, Ser: 2.07 mg/dL — ABNORMAL HIGH (ref 0.61–1.24)
GFR, EST AFRICAN AMERICAN: 36 mL/min — AB (ref >60)
GFR, EST NON AFRICAN AMERICAN: 31 mL/min — AB (ref >60)
GLUCOSE: 162 mg/dL — AB (ref 70–99)
POTASSIUM: 3.9 mmol/L (ref 3.5–5.1)
Sodium: 140 mmol/L (ref 135–145)

## 2017-08-17 LAB — URINE CULTURE: Culture: <10000 — AB

## 2017-08-17 LAB — IRON AND TIBC: Iron: 36 ug/dL — ABNORMAL LOW (ref 45–182)

## 2017-08-17 LAB — GLUCOSE, CAPILLARY
GLUCOSE-CAPILLARY: 122 mg/dL — AB (ref 70–99)
Glucose-Capillary: 139 mg/dL — ABNORMAL HIGH (ref 70–99)
Glucose-Capillary: 164 mg/dL — ABNORMAL HIGH (ref 70–99)

## 2017-08-17 LAB — PARATHYROID HORMONE, INTACT (NO CA): PTH: 115 pg/mL — AB (ref 15–65)

## 2017-08-17 LAB — MPO/PR-3 (ANCA) ANTIBODIES: Myeloperoxidase Abs: <9 U/mL (ref 0.0–9.0)

## 2017-08-17 LAB — C4 COMPLEMENT: COMPLEMENT C4, BODY FLUID: 28 mg/dL (ref 14–44)

## 2017-08-17 LAB — FERRITIN: Ferritin: 1217 ng/mL — ABNORMAL HIGH (ref 24–336)

## 2017-08-17 LAB — ANA W/REFLEX IF POSITIVE: Anti Nuclear Antibody(ANA): NEGATIVE

## 2017-08-17 LAB — TRANSFERRIN

## 2017-08-17 LAB — C3 COMPLEMENT: C3 Complement: 132 mg/dL (ref 82–167)

## 2017-08-17 MED ORDER — CEPHALEXIN 500 MG PO CAPS
500.0000 mg | ORAL_CAPSULE | Freq: Two times a day (BID) | ORAL | Status: DC
  Administered 2017-08-17: 13:00:00 500 mg via ORAL
  Filled 2017-08-17: qty 1

## 2017-08-17 MED ORDER — TAMSULOSIN HCL 0.4 MG PO CAPS: 0.4000 mg | ORAL_CAPSULE | Freq: Every day | ORAL | 11 refills | Status: DC

## 2017-08-17 MED ORDER — TAMSULOSIN HCL 0.4 MG PO CAPS
0.4000 mg | ORAL_CAPSULE | Freq: Every day | ORAL | Status: DC
  Administered 2017-08-17: 19:00:00 0.4 mg via ORAL
  Filled 2017-08-17: qty 1

## 2017-08-17 MED ORDER — CEPHALEXIN 500 MG PO CAPS: 500.0000 mg | ORAL_CAPSULE | Freq: Two times a day (BID) | ORAL | 0 refills | Status: DC

## 2017-08-17 NOTE — Progress Notes (Signed)
[facility]  ROUNDING NOTE   Subjective:  Renal function continues to improve. Creatinine down to 2.07. Awaiting urology input.  Objective:  Vital signs in last 24 hours:  Temp:  [97.4 F (36.3 C)-98.4 F (36.9 C)] 98.1 F (36.7 C) (07/13 0400) Pulse Rate:  [72-83] 83 (07/13 0400) Resp:  [18-20] 18 (07/13 0400) BP: (121-141)/(60-78) 141/74 (07/13 0400) SpO2:  [98 %-100 %] 100 % (07/13 0400)  Weight change:  Filed Weights   08/14/17 0934 08/14/17 1352  Weight: 81.6 kg (180 lb) 80.4 kg (177 lb 5 oz)    Intake/Output: I/O last 3 completed shifts: In: 6083 [P.O.:823; I.V.:4953.3; IV Piggyback:306.7] Out: 104 [Urine:102; Stool:2]   Intake/Output this shift:  Total I/O In: 6616.2 [P.O.:120; I.V.:6496.2] Out: -   Physical Exam: General: No acute distress  Head: Normocephalic, atraumatic. Moist oral mucosal membranes  Eyes: Anicteric  Neck: Supple, trachea midline  Lungs:  Clear to auscultation, normal effort  Heart: S1S2 no rubs  Abdomen:  Soft, nontender, bowel sounds present  Extremities: No peripheral edema.  Neurologic: Awake, alert, following commands  Skin: No lesions       Basic Metabolic Panel: Recent Labs  Lab 08/14/17 1003 08/15/17 0451 08/16/17 0419 08/17/17 0732  NA 137 138 138 140  K 4.8 4.6 3.7 3.9  CL 103 109 108 112*  CO2 22 21* 20* 21*  GLUCOSE 224* 238* 168* 162*  BUN 45* 45* 41* 33*  CREATININE 3.16* 3.26* 3.06* 2.07*  CALCIUM 8.6* 7.7* 7.8* 7.5*    Liver Function Tests: Recent Labs  Lab 08/14/17 1003  AST 35  ALT 41  ALKPHOS 101  BILITOT 2.0*  PROT 8.1  ALBUMIN 2.9*   Recent Labs  Lab 08/14/17 1003  LIPASE 39   No results for input(s): AMMONIA in the last 168 hours.  CBC: Recent Labs  Lab 08/14/17 1003 08/15/17 0451 08/17/17 0732  WBC 10.9* 8.4  --   HGB 11.2* 9.5* 9.3*  HCT 33.1* 28.2*  --   MCV 94.4 94.0  --   PLT 159 141*  --     Cardiac Enzymes: No results for input(s): CKTOTAL, CKMB,  CKMBINDEX, TROPONINI in the last 168 hours.  BNP: Invalid input(s): POCBNP  CBG: Recent Labs  Lab 08/16/17 0743 08/16/17 1146 08/16/17 1724 08/16/17 2105 08/17/17 0739  GLUCAP 178* 249* 174* 168* 139*    Microbiology: Results for orders placed or performed during the hospital encounter of 08/14/17  Urine Culture     Status: Abnormal   Collection Time: 08/14/17 10:56 AM  Result Value Ref Range Status   Specimen Description   Final    URINE, RANDOM Performed at [facility], [address] [place], [place] [address]    Special Requests   Final    NONE Performed at [facility], [address] [place], [place] [address]    Culture MULTIPLE SPECIES PRESENT, SUGGEST RECOLLECTION (A)  Final   Report Status 08/15/2017 FINAL  Final  CULTURE, BLOOD (ROUTINE X 2) w Reflex to ID Panel     Status: None (Preliminary result)   Collection Time: 08/14/17  9:23 PM  Result Value Ref Range Status   Specimen Description BLOOD BLOOD RIGHT WRIST  Final   Special Requests   Final    BOTTLES DRAWN AEROBIC AND ANAEROBIC Blood Culture adequate volume   Culture   Final    NO GROWTH 3 DAYS Performed at [facility], [address] [place], [place] [address]  Report Status PENDING  Incomplete  CULTURE, BLOOD (ROUTINE X 2) w Reflex to ID Panel     Status: None (Preliminary result)   Collection Time: 08/14/17  9:23 PM  Result Value Ref Range Status   Specimen Description BLOOD BLOOD LEFT WRIST  Final   Special Requests   Final    BOTTLES DRAWN AEROBIC AND ANAEROBIC Blood Culture adequate volume   Culture   Final    NO GROWTH 3 DAYS Performed at [facility], [address], [place], [place] [address]    Report Status PENDING  Incomplete  Urine Culture     Status: Abnormal   Collection Time: 08/16/17  9:37 AM  Result Value Ref Range Status   Specimen Description   Final    URINE, RANDOM Performed at [facility], [address],  [place], [place] [address]    Special Requests   Final    NONE Performed at [facility], [address] [place], [place] [address]    Culture (A)  Final    <10,000 COLONIES/mL INSIGNIFICANT GROWTH Performed at [facility], [place] [address], [place], [place] [address]    Report Status 08/17/2017 FINAL  Final    Coagulation Studies: No results for input(s): LABPROT, INR in the last 72 hours.  Urinalysis: Recent Labs    08/16/17 0937  COLORURINE AMBER*  LABSPEC 1.018  PHURINE 5.0  GLUCOSEU NEGATIVE  HGBUR MODERATE*  BILIRUBINUR NEGATIVE  KETONESUR 5*  PROTEINUR 30*  NITRITE NEGATIVE  LEUKOCYTESUR LARGE*      Imaging: Ct Abdomen Pelvis Wo Contrast  Result Date: 08/15/2017 CLINICAL DATA:  72 year old male with diagnosis of acute renal failure and UTI. Possible renal mass was seen on the earlier ultrasound. EXAM: CT ABDOMEN AND PELVIS WITHOUT CONTRAST TECHNIQUE: Multidetector CT imaging of the abdomen and pelvis was performed following the standard protocol without IV contrast. COMPARISON:  Renal ultrasound dated 08/14/2017 and CT of the abdomen pelvis dated 01/12/2016. FINDINGS: Evaluation of this exam is limited in the absence of intravenous contrast. Lower chest: Partially visualized trace bilateral pleural effusions. There is subsegmental bibasilar densities which may represent atelectatic changes although infiltrate is not entirely excluded. Clinical correlation is recommended. There is borderline cardiomegaly. There is hypoattenuation of the cardiac blood pool suggestive of a degree of anemia. Clinical correlation is recommended. No intra-abdominal free air or free fluid. Hepatobiliary: The liver is grossly unremarkable on this noncontrast CT. No biliary ductal dilatation. There is probable small stones in the neck of the gallbladder. No pericholecystic fluid. Pancreas: Unremarkable. No pancreatic ductal dilatation or surrounding inflammatory changes. Spleen: The spleen  is grossly unremarkable on this noncontrast CT. The described lesion seen on the earlier ultrasound is not well visualized in the absence of contrast. Adrenals/Urinary Tract: The adrenal glands are unremarkable. There are bilateral renal hypodense lesions measuring up to 2 cm in the inferior pole of the right kidney. These lesions are not characterized on this unenhanced CT. The left renal upper pole lesion described on the earlier ultrasound is not well visualized or evaluated on this noncontrast CT. Further characterization with dedicated CT or MRI using renal mass protocol is recommended. There is mild left hydronephrosis as well as mild left hydroureter. A transition zone is seen in the distal left ureter. No definite obstructing stone identified. Although this may be related to a recently passed left renal calculus, an underlying urothelial lesion/neoplasm is not excluded. Further evaluation with dedicated CT urography is recommended. There is no  hydronephrosis or nephrolithiasis on the right. The urinary bladder is predominantly collapsed. Stomach/Bowel: There is a small hiatal hernia with gastroesophageal reflux. Oral contrast opacifies the stomach and multiple loops of small bowel and traverses into the colon. There is no bowel obstruction or active inflammation. Appendectomy. Vascular/Lymphatic: There is moderate aortoiliac atherosclerotic disease. The abdominal aorta and IVC are otherwise grossly unremarkable on this noncontrast CT. No portal venous gas. There is no adenopathy. Reproductive: The prostate and seminal vesicles are grossly unremarkable as visualized. Other: Partially visualized left hydrocele. There is a midline vertical anterior pelvic wall incisional scar. Musculoskeletal: Degenerative changes of the spine. No acute osseous pathology. IMPRESSION: 1. Mild left hydronephroureter. No obstructing stone identified. Underlying distal left ureteral lesion is not entirely excluded. Further  evaluation with dedicated CT urography recommended. 2. Bilateral renal hypodense lesions incompletely evaluated or characterized on this unenhanced CT. 3.  Aortic Atherosclerosis (ICD10-I70.0). Electronically Signed   By: Anner Crete M.D.   On: 08/15/2017 22:12   Mr Abdomen Wo Contrast  Result Date: 08/16/2017 CLINICAL DATA:  Weakness and abdominal cramping. Acute renal failure. Colon cancer with prior colectomy. Coronary artery disease with prior bypass. Left renal mass. EXAM: MRI ABDOMEN WITHOUT CONTRAST TECHNIQUE: Multiplanar multisequence MR imaging was performed without the administration of intravenous contrast. Contrast could not be administered, the patient's GFR is 21. COMPARISON:  08/15/2017 FINDINGS: Despite efforts by the technologist and patient, motion artifact is present on today's exam and could not be eliminated. This reduces exam sensitivity and specificity. This is a common result when abdominal MRI is attempted in the inpatient setting where patients are less likely to be able to breath hold and cooperate in controlling motion. Lower chest: Trace bilateral pleural effusions.  Mild cardiomegaly. Hepatobiliary: Sludge is present in the gallbladder along with a suspected 1.5 cm gallstone on image 21/10. The liver appears otherwise unremarkable where included. Pancreas:  Unremarkable Spleen: The spleen measures 13.3 by 5.3 by 11.2 cm (volume = 410 cm^3). There are approximately 10 small foci of T2 signal hyperintensity in the spleen which are technically nonspecific, but which appear to of been present on prior exams such as 01/12/2016 and 06/19/2013, without significant progression. Adrenals/Urinary Tract: Expanded size the left kidney with suspected diffuse edema/infiltration based on accentuated T2 signal and striated appearance of the kidney for example on image 10/5. Left hydroureter and mild hydronephrosis. The striated appearance of the left kidney appears disproportionate to the  degree of hydronephrosis. Heterogeneous T2 signal in the left kidney. Bilateral fluid signal intensity lesions in the right kidney appear roughly stable compared back to cysts seen on 06/19/2013. Left kidney lower pole cyst on image 16/5 likewise similar to remote prior exams. A 2.0 by 1.4 cm lesion anteriorly in the left kidney lower pole on image 15/5 is nonspecific. There is some heterogeneity of the fluid in the left collecting system, for example on image 17/4. Stomach/Bowel: Unremarkable Vascular/Lymphatic:  Aortoiliac atherosclerotic vascular disease. Other: Mild bilateral perirenal stranding, left greater than right, with edema tracking along the retroperitoneum and paraspinal musculature bilaterally. Some low-level edema tracking along the bilateral flank musculature is well. Musculoskeletal: Unremarkable IMPRESSION: 1. Abnormal expansion of the left kidney with striated appearance suggesting diffuse edema/infiltration. Mildly asymmetric left perirenal stranding. Heterogeneous debris in the left collecting system/dilated proximal left ureter. The striated appearance is disproportionate to the degree of hydronephrosis, and I am suspicious for pyelonephritis and possibly pyonephrosis. Infiltrating renal malignancy is possible but less likely. No expansion of the left renal vein  to suggest renal vein thrombosis as a alternative etiology. 2. Bilateral renal cysts. A lesion anteriorly in the left kidney lower pole is technically nonspecific and clearly complex. This could be a complex cyst or mass. 3. Cholelithiasis with sludge in the gallbladder. 4. Mild cardiomegaly with trace bilateral pleural effusions. 5. Scattered small T2 signal hyperintensities in the spleen, nonspecific but stable from 06/19/2013 hence probably benign. 6.  Aortic Atherosclerosis (ICD10-I70.0). Electronically Signed   By: Van Clines M.D.   On: 08/16/2017 09:00     Medications:    . aspirin EC  81 mg Oral Daily  .  atorvastatin  40 mg Oral Daily  . cephALEXin  500 mg Oral Q12H  . docusate sodium  100 mg Oral Daily  . gabapentin  100 mg Oral TID  . heparin  5,000 Units Subcutaneous Q8H  . insulin aspart  0-5 Units Subcutaneous QHS  . insulin aspart  0-9 Units Subcutaneous TID WC  . insulin glargine  8 Units Subcutaneous QHS  . pantoprazole  40 mg Oral QAC breakfast  . polyethylene glycol  17 g Oral Daily   acetaminophen **OR** acetaminophen, bisacodyl, ondansetron **OR** ondansetron (ZOFRAN) IV  Assessment/ Plan:  72 y.o. male ith a PMHx of coronary artery disease, chronic systolic heart failure ejection fraction 20 to 25%, colon cancer status post colectomy followed by chemotherapy, hypertension, chronic kidney disease stage II, who was admitted to [facility] on 08/14/2017 for evaluation of weakness.  1.  Acute renal failure/chronic kidney disease stage III baseline creatinine 1.3.  Suspect acute renal failure secondary to dehydration from suspected UTI as well as being on lisinopril and Lasix in a volume depleted state.   -Renal function improved as creatinine is down to 2.0.  Continue to monitor renal parameters.   2.  Suspected urinary tract infection.    Patient transition to cephalexin.  3.  Anemia chronic kidney disease.  Hemoglobin relatively stable at 9.3.  4.  Left upper pole renal mass vs left sided pyelonephritis/left hydroureter and mild hydronephrosis.  -CT abdomen and pelvis as well as MRI of the abdomen were both reviewed.  It appears that patient has mild left-sided hydronephrosis as well as left hydroureter.  There is also question of pyelonephritis versus pyelonephritis.  Awaiting urology input.   LOS: 3 [name] 7/13/201912:10 PM

## 2017-08-17 NOTE — Progress Notes (Signed)
MD order received to discharge pt home today; verbally reviewed AVS with pt, gave Rx for Keflex to pt; no questions voiced at this time; pt discharged via wheelchair by nursing to the visitor's entrance

## 2017-08-17 NOTE — Discharge Summary (Addendum)
[facility] NAME: [name]    MR#:  [id number]  DATE OF BIRTH:  07-29-1945  DATE OF ADMISSION:  08/14/2017   ADMITTING PHYSICIAN: Henreitta Leber, MD  DATE OF DISCHARGE: 08/17/2017  PRIMARY CARE PHYSICIAN: Leone Haven, MD   ADMISSION DIAGNOSIS:  Lower urinary tract infectious disease [N39.0] Acute renal failure (ARF) (HCC) [N17.9] Generalized weakness [R53.1] Acute renal failure, unspecified acute renal failure type ([place]) [N17.9] DISCHARGE DIAGNOSIS:  Active Problems:   Acute renal failure (ARF) ([place])  SECONDARY DIAGNOSIS:   Past Medical History:  Diagnosis Date  . Anemia   . CAD (coronary artery disease)    a. 02/2015 Cath: LM nl, LAD 50/40 mid/distal, LCX 80p, RCA 40p, 30d, RPL1 40, CO 3.27, CI 1.65; b. cath 06/09/15: LM minor irregs, m-dLAD 50%, dLAD 40%, pLCx 80% s/p PCI/DES 0%, pRCA 40%, dRCA 30%, 1st RPLB 40%  . Chronic systolic CHF (congestive heart failure) ([place])    a. 02/2015 Echo: EF 20-25%, diff HK, mod MR, mildy dil LA, mildly dil RV w/ mod RV syst dysfxn, mildly dil RA, mod TR, PASP 24mmHg.  . Colon cancer ([place])    a. 2015 s/p colectomy followed by chemoRx with oxaliplatin & fluorouacil.  Mar[place] Kitchen Hypertension   . Hypertensive heart disease   . Mixed Ischemic and Non-ischemic Cardiomyopathy    a. 02/2015 Echo: EF 20-25%, diff HK.  . Moderate mitral regurgitation   . Moderate tricuspid regurgitation   . Tubular adenoma of colon    HOSPITAL COURSE:    72 year old male with past medical history of hypertension, hyperlipidemia, GERD, history of chronic systolic CHF, coronary artery disease status post bypass, history of colon cancer status post colectomy with postoperative chemo who presents to the hospital due to generalized weakness, abdominal cramping and noted to be in acute kidney injury and also noted to have urinary tract infection.  1. Acute renal failure-secondary to the underlying UTI,  pyelonephritis and dehydration. Improving with IV fluids, follow BUN and creatinine urine output. Hold lisinopril. Hold Aldactone. Renal ultrasound: No obstruction but does show left-sided renal mass. Per Abd MRI: suspicious for pyelonephritis and possibly pyonephrosis. Urology consult per Dr. Holley Raring. Per Dr. Alyson Ingles, patient can be discharged to home today and he will see the patient before discharge.   2. Urinary tract infection and pyelonephritis He has been treated with IV ceftriaxone, follow blood culture and urine culture: Insignificant growth.  Changed to Keflex p.o. twice daily for 7 more days.  Follow-up urologist as outpatient.  3. History of chronic systolic CHF LV EF: 73% - 40% Stable.  Given the acute kidney injury, hold lisinopril, Lasix, Aldactone.  Hold carvedilol due to low side blood pressure. Resumed Coreg.  4. Neuropathy-continue gabapentin.  5. Hyperlipidemia-continue atorvastatin.  6. Essential hypertension-hold Norvasc, resume carvedilol. 7. GERD-continue Protonix.  Anemia.    Unclear etiology. Hemoglobin decreased to 9.5, repeat 9.3, possible due to IV fluid dilution.  No active bleeding.  Follow-up PCP.  *Mild hyperglycemia without history of diabetes.  New diagnosis of diabetes 2. Controlled with sliding scale. Hemoglobin A1c 7.8. Added 8 unit lantus HS. ADA diet.  No metformin this time due to renal failure.  Follow-up with PCP to start diabetes medication. I discussed with Dr. Holley Raring and Dr. Alyson Ingles. DISCHARGE CONDITIONS:  Stable, discharge to home today. CONSULTS OBTAINED:  Treatment Team:  Sharmaine Base, MD Cleon Gustin, MD DRUG ALLERGIES:   Allergies  Allergen Reactions  . Shellfish  Allergy Other (See Comments)    Pt. instructed by MD to avoid seafood   DISCHARGE MEDICATIONS:   Allergies as of 08/17/2017      Reactions   Shellfish Allergy Other (See Comments)   Pt. instructed by MD to avoid seafood        Medication List    STOP taking these medications   amLODipine 10 MG tablet Commonly known as:  NORVASC   furosemide 40 MG tablet Commonly known as:  LASIX   lisinopril 40 MG tablet Commonly known as:  PRINIVIL,ZESTRIL   spironolactone 25 MG tablet Commonly known as:  ALDACTONE     TAKE these medications   aspirin EC 81 MG tablet Take 1 tablet (81 mg total) by mouth daily.   atorvastatin 40 MG tablet Commonly known as:  LIPITOR take 1 tablet by mouth once daily   carvedilol 3.125 MG tablet Commonly known as:  COREG Take 1 tablet (3.125 mg total) by mouth 2 (two) times daily.   cephALEXin 500 MG capsule Commonly known as:  KEFLEX Take 1 capsule (500 mg total) by mouth every 12 (twelve) hours.   gabapentin 100 MG capsule Commonly known as:  NEURONTIN Take 2 capsules (200 mg total) by mouth 3 (three) times daily. What changed:  how much to take   omeprazole 20 MG capsule Commonly known as:  PRILOSEC Take 20 mg by mouth daily.        DISCHARGE INSTRUCTIONS:  See AVS.  If you experience worsening of your admission symptoms, develop shortness of breath, life threatening emergency, suicidal or homicidal thoughts you must seek medical attention immediately by calling 911 or calling your MD immediately  if symptoms less severe.  You Must read complete instructions/literature along with all the possible adverse reactions/side effects for all the Medicines you take and that have been prescribed to you. Take any new Medicines after you have completely understood and accpet all the possible adverse reactions/side effects.   Please note  You were cared for by a hospitalist during your hospital stay. If you have any questions about your discharge medications or the care you received while you were in the hospital after you are discharged, you can call the unit and asked to speak with the hospitalist on call if the hospitalist that took care of you is not available. Once you  are discharged, your primary care physician will handle any further medical issues. Please note that NO REFILLS for any discharge medications will be authorized once you are discharged, as it is imperative that you return to your primary care physician (or establish a relationship with a primary care physician if you do not have one) for your aftercare needs so that they can reassess your need for medications and monitor your lab values.    On the day of Discharge:  VITAL SIGNS:  Blood pressure 125/72, pulse 80, temperature 98 F (36.7 C), temperature source Oral, resp. rate 18, height 5\' 9"  (1.753 m), weight 177 lb 5 oz (80.4 kg), SpO2 97 %. PHYSICAL EXAMINATION:  GENERAL:  72 y.o.-year-old patient lying in the bed with no acute distress.  EYES: Pupils equal, round, reactive to light and accommodation. No scleral icterus. Extraocular muscles intact.  HEENT: Head atraumatic, normocephalic. Oropharynx and nasopharynx clear.  NECK:  Supple, no jugular venous distention. No thyroid enlargement, no tenderness.  LUNGS: Normal breath sounds bilaterally, no wheezing, rales,rhonchi or crepitation. No use of accessory muscles of respiration.  CARDIOVASCULAR: S1, S2 normal. No murmurs, rubs, or  gallops.  ABDOMEN: Soft, non-tender, non-distended. Bowel sounds present. No organomegaly or mass.  EXTREMITIES: No pedal edema, cyanosis, or clubbing.  NEUROLOGIC: Cranial nerves II through XII are intact. Muscle strength 5/5 in all extremities. Sensation intact. Gait not checked.  PSYCHIATRIC: The patient is alert and oriented x 3.  SKIN: No obvious rash, lesion, or ulcer.  DATA REVIEW:   CBC Recent Labs  Lab 08/15/17 0451 08/17/17 0732  WBC 8.4  --   HGB 9.5* 9.3*  HCT 28.2*  --   PLT 141*  --     Chemistries  Recent Labs  Lab 08/14/17 1003  08/17/17 0732  NA 137   < > 140  K 4.8   < > 3.9  CL 103   < > 112*  CO2 22   < > 21*  GLUCOSE 224*   < > 162*  BUN 45*   < > 33*  CREATININE 3.16*    < > 2.07*  CALCIUM 8.6*   < > 7.5*  AST 35  --   --   ALT 41  --   --   ALKPHOS 101  --   --   BILITOT 2.0*  --   --    < > = values in this interval not displayed.     Microbiology Results  Results for orders placed or performed during the hospital encounter of 08/14/17  Urine Culture     Status: Abnormal   Collection Time: 08/14/17 10:56 AM  Result Value Ref Range Status   Specimen Description   Final    URINE, RANDOM Performed at [facility], [address] [place], [place] [address]    Special Requests   Final    NONE Performed at [facility], [address] [place], [place] [address]    Culture MULTIPLE SPECIES PRESENT, SUGGEST RECOLLECTION (A)  Final   Report Status 08/15/2017 FINAL  Final  CULTURE, BLOOD (ROUTINE X 2) w Reflex to ID Panel     Status: None (Preliminary result)   Collection Time: 08/14/17  9:23 PM  Result Value Ref Range Status   Specimen Description BLOOD BLOOD RIGHT WRIST  Final   Special Requests   Final    BOTTLES DRAWN AEROBIC AND ANAEROBIC Blood Culture adequate volume   Culture   Final    NO GROWTH 3 DAYS Performed at [facility], [address] [place], [place] [address]    Report Status PENDING  Incomplete  CULTURE, BLOOD (ROUTINE X 2) w Reflex to ID Panel     Status: None (Preliminary result)   Collection Time: 08/14/17  9:23 PM  Result Value Ref Range Status   Specimen Description BLOOD BLOOD LEFT WRIST  Final   Special Requests   Final    BOTTLES DRAWN AEROBIC AND ANAEROBIC Blood Culture adequate volume   Culture   Final    NO GROWTH 3 DAYS Performed at [facility], [address] [place], [place] [address]    Report Status PENDING  Incomplete  Urine Culture     Status: Abnormal   Collection Time: 08/16/17  9:37 AM  Result Value Ref Range Status   Specimen Description   Final    URINE, RANDOM Performed at [facility], [address] [place], [place] [address]    Special  Requests   Final    NONE Performed at [facility], [address], [place], [place] [address]    Culture (A)  Final    <  10,000 COLONIES/mL INSIGNIFICANT GROWTH Performed at [facility], [place] [address] [place], [place] [address]    Report Status 08/17/2017 FINAL  Final    RADIOLOGY:  No results found.   Management plans discussed with the patient, family and they are in agreement.  CODE STATUS: Full Code   TOTAL TIME TAKING CARE OF THIS PATIENT: 36 minutes.    Demetrios Loll M.D on 08/17/2017 at 4:25 PM  Between 7am to 6pm - Pager - [phone]  After 6pm go to [email or web address] - [employer] [facility]  Office  [phone]  CC: Primary care physician; Leone Haven, MD   Note: This dictation was prepared with Dragon dictation along with smaller phrase technology. Any transcriptional errors that result from this process are unintentional.

## 2017-08-17 NOTE — Discharge Instructions (Signed)
Heart healthy and ADA diet. °

## 2017-08-17 NOTE — Progress Notes (Signed)
Spoke with Dr Alyson Ingles via telephone call; asking if the pt was going to be on Flomax 0.4 mg PO daily after supper at home; Dr Alyson Ingles advised me that he was; advised him that the pt would need a Rx for Flomax and that Dr Bridgett Larsson (the Primary Physician) had already left for the day; after clarification with the pt's spouse, Dr Alyson Ingles will electronically submit a Rx to [facility] on [address] in [place], [place] for the [employer]

## 2017-08-17 NOTE — Consult Note (Signed)
Urology Consult  Referring physician: Dr. Bridgett Larsson Reason for referral: left hydronephrosis, left pyelonephritis  Chief Complaint: left flank pain  History of Present Illness: [name] is a 72yo with a hx of CAD, CHF, HTN, and colon cancer admitted 7/10 with UTI and pyelonephritis. He underwent CT which showed mild left hydronephrosis to the level of the UVJ and perinephric stranding and debris consistent with pyelonephritis. Per the patient his is his firs t UTI. At baseline he has severe LUTS. He has frequency every 1-1.5 hours, urgency, urge incontinence, nocturia 3-4, a feeling of incomplete emptying. He had gross hematuria 10 days ago. No dysuria. No hx of nephrolithiasis.  In 2015 he underwent colon resection followed by chemotherapy. His LUTS at that time were mild and shortly after he finished his chemo regiment his LUTS became severe. He has never been on a BPH med. Creatinien on admission was 3 and currently it is  2.07.  Past Medical History:  Diagnosis Date  . Anemia   . CAD (coronary artery disease)    a. 02/2015 Cath: LM nl, LAD 50/40 mid/distal, LCX 80p, RCA 40p, 30d, RPL1 40, CO 3.27, CI 1.65; b. cath 06/09/15: LM minor irregs, m-dLAD 50%, dLAD 40%, pLCx 80% s/p PCI/DES 0%, pRCA 40%, dRCA 30%, 1st RPLB 40%  . Chronic systolic CHF (congestive heart failure) ([place])    a. 02/2015 Echo: EF 20-25%, diff HK, mod MR, mildy dil LA, mildly dil RV w/ mod RV syst dysfxn, mildly dil RA, mod TR, PASP 45mHg.  . [place]    a. 2015 s/p colectomy followed by chemoRx with oxaliplatin & fluorouacil.  .Marland KitchenHypertension   . Hypertensive heart disease   . Mixed Ischemic and Non-ischemic Cardiomyopathy    a. 02/2015 Echo: EF 20-25%, diff HK.  . Moderate mitral regurgitation   . Moderate tricuspid regurgitation   . Tubular adenoma of colon    Past Surgical History:  Procedure Laterality Date  . CARDIAC CATHETERIZATION Bilateral 02/24/2015   Procedure: Right/Left Heart Cath and Coronary  Angiography;  Surgeon: MWellington Hampshire MD;  [facility];  Service: Cardiovascular;  Laterality: Bilateral;  . CARDIAC CATHETERIZATION N/A 06/09/2015   Procedure: Coronary Stent Intervention;  Surgeon: MWellington Hampshire MD;  [facility];  Service: Cardiovascular;  Laterality: N/A;  . COLON SURGERY     Colectomy  . COLONOSCOPY    . COLONOSCOPY WITH PROPOFOL N/A 12/11/2016   Procedure: COLONOSCOPY WITH PROPOFOL;  Surgeon: SLollie Sails MD;  [facility];  Service: Endoscopy;  Laterality: N/A;  . ESOPHAGOGASTRODUODENOSCOPY    . PORT-A-CATH REMOVAL N/A 07/12/2014   Procedure: REMOVAL PORT-A-CATH;  Surgeon: WMolly Maduro MD;  Location: [facility];  Service: General;  Laterality: N/A;  . PORTACATH PLACEMENT Left     Medications: I have reviewed the patient's current medications. Allergies:  Allergies  Allergen Reactions  . Shellfish Allergy Other (See Comments)    Pt. instructed by MD to avoid seafood    Family History  Problem Relation Age of Onset  . Cancer Mother   . Cancer Father    Social History:  reports that he has never smoked. He has never used smokeless tobacco. He reports that he does not drink alcohol or use drugs.  Review of Systems  Gastrointestinal: Positive for nausea.  Genitourinary: Positive for flank pain, frequency, hematuria and urgency.  All other systems reviewed and are negative.   Physical Exam:  Vital signs in last 24 hours: Temp:  [  98 F (36.7 C)-98.4 F (36.9 C)] 98 F (36.7 C) (07/13 1603) Pulse Rate:  [72-83] 80 (07/13 1603) Resp:  [18] 18 (07/13 1603) BP: (121-141)/(60-74) 125/72 (07/13 1603) SpO2:  [97 %-100 %] 97 % (07/13 1603) Physical Exam  Constitutional: He is oriented to person, place, and time. He appears well-developed and well-nourished.  HENT:  Head: Normocephalic and atraumatic.  Eyes: Pupils are equal, round, and reactive to light. EOM are normal.  Neck: Normal range of  motion. No thyromegaly present.  Cardiovascular: Normal rate and regular rhythm.  Respiratory: Effort normal. No respiratory distress.  GI: Soft. He exhibits no distension. Hernia confirmed negative in the right inguinal area and confirmed negative in the left inguinal area.  Genitourinary: Rectum normal, testes normal and penis normal. Prostate is enlarged. Prostate is not tender. Uncircumcised.  Musculoskeletal: Normal range of motion. He exhibits no edema.  Lymphadenopathy:       Right: No inguinal adenopathy present.       Left: No inguinal adenopathy present.  Neurological: He is alert and oriented to person, place, and time.  Skin: Skin is warm and dry.  Psychiatric: He has a normal mood and affect. His behavior is normal. Thought content normal.    Laboratory Data:  Results for orders placed or performed during the hospital encounter of 08/14/17 (from the past 72 hour(s))  Lactic acid, plasma     Status: None   Collection Time: 08/14/17  9:22 PM  Result Value Ref Range   Lactic Acid, Venous 1.0 0.5 - 1.9 mmol/L    Comment: Performed at [facility], [address] [place], [place] [address]  CULTURE, BLOOD (ROUTINE X 2) w Reflex to ID Panel     Status: None (Preliminary result)   Collection Time: 08/14/17  9:23 PM  Result Value Ref Range   Specimen Description BLOOD BLOOD RIGHT WRIST    Special Requests      BOTTLES DRAWN AEROBIC AND ANAEROBIC Blood Culture adequate volume   Culture      NO GROWTH 3 DAYS Performed at [facility], [address] [place], [place] [address]    Report Status PENDING   CULTURE, BLOOD (ROUTINE X 2) w Reflex to ID Panel     Status: None (Preliminary result)   Collection Time: 08/14/17  9:23 PM  Result Value Ref Range   Specimen Description BLOOD BLOOD LEFT WRIST    Special Requests      BOTTLES DRAWN AEROBIC AND ANAEROBIC Blood Culture adequate volume   Culture      NO GROWTH 3 DAYS Performed at [facility],  [address] [place], [place] [address]    Report Status PENDING   HIV antibody (Routine Testing)     Status: None   Collection Time: 08/15/17  4:51 AM  Result Value Ref Range   HIV Screen 4th Generation wRfx Non Reactive Non Reactive    Comment: (NOTE) Performed At: [facility] [place], [place] [id number] Rush Farmer MD [id number]   Basic metabolic panel     Status: Abnormal   Collection Time: 08/15/17  4:51 AM  Result Value Ref Range   Sodium 138 135 - 145 mmol/L   Potassium 4.6 3.5 - 5.1 mmol/L   Chloride 109 98 - 111 mmol/L    Comment: Please note change in reference range.   CO2 21 (L) 22 - 32 mmol/L   Glucose, Bld 238 (H) 70 - 99 mg/dL    Comment: Please  note change in reference range.   BUN 45 (H) 8 - 23 mg/dL    Comment: Please note change in reference range.   Creatinine, Ser 3.26 (H) 0.61 - 1.24 mg/dL   Calcium 7.7 (L) 8.9 - 10.3 mg/dL   GFR calc non Af Amer 18 (L) >60 mL/min   GFR calc Af Amer 21 (L) >60 mL/min    Comment: (NOTE) The eGFR has been calculated using the CKD EPI equation. This calculation has not been validated in all clinical situations. eGFR's persistently <60 mL/min signify possible Chronic Kidney Disease.    Anion gap 8 5 - 15    Comment: Performed at [facility], [address] [place], [place] [address]  CBC     Status: Abnormal   Collection Time: 08/15/17  4:51 AM  Result Value Ref Range   WBC 8.4 3.8 - 10.6 K/uL   RBC 3.00 (L) 4.40 - 5.90 MIL/uL   Hemoglobin 9.5 (L) 13.0 - 18.0 g/dL   HCT 28.2 (L) 40.0 - 52.0 %   MCV 94.0 80.0 - 100.0 fL   MCH 31.7 26.0 - 34.0 pg   MCHC 33.8 32.0 - 36.0 g/dL   RDW 14.9 (H) 11.5 - 14.5 %   Platelets 141 (L) 150 - 440 K/uL    Comment: Performed at [facility], [address] [place], [place] [address]  Hemoglobin A1c     Status: Abnormal   Collection Time: 08/15/17  4:51 AM  Result Value Ref Range   Hgb A1c MFr Bld 7.8 (H) 4.8 - 5.6 %     Comment: (NOTE) Pre diabetes:          5.7%-6.4% Diabetes:              >6.4% Glycemic control for   <7.0% adults with diabetes    Mean Plasma Glucose 177.16 mg/dL    Comment: Performed at [facility], [place] [address], [place], [place] [address]  Glucose, capillary     Status: Abnormal   Collection Time: 08/15/17 11:41 AM  Result Value Ref Range   Glucose-Capillary 225 (H) 70 - 99 mg/dL  Glucose, capillary     Status: Abnormal   Collection Time: 08/15/17  5:03 PM  Result Value Ref Range   Glucose-Capillary 167 (H) 70 - 99 mg/dL  Glucose, capillary     Status: Abnormal   Collection Time: 08/15/17  9:19 PM  Result Value Ref Range   Glucose-Capillary 193 (H) 70 - 99 mg/dL  Glucose, capillary     Status: Abnormal   Collection Time: 08/15/17 11:30 PM  Result Value Ref Range   Glucose-Capillary 182 (H) 70 - 99 mg/dL  Basic metabolic panel     Status: Abnormal   Collection Time: 08/16/17  4:19 AM  Result Value Ref Range   Sodium 138 135 - 145 mmol/L   Potassium 3.7 3.5 - 5.1 mmol/L   Chloride 108 98 - 111 mmol/L    Comment: Please note change in reference range.   CO2 20 (L) 22 - 32 mmol/L   Glucose, Bld 168 (H) 70 - 99 mg/dL    Comment: Please note change in reference range.   BUN 41 (H) 8 - 23 mg/dL    Comment: Please note change in reference range.   Creatinine, Ser 3.06 (H) 0.61 - 1.24 mg/dL   Calcium 7.8 (L) 8.9 - 10.3 mg/dL   GFR calc non Af Amer 19 (L) >60 mL/min   GFR calc Af  Amer 22 (L) >60 mL/min    Comment: (NOTE) The eGFR has been calculated using the CKD EPI equation. This calculation has not been validated in all clinical situations. eGFR's persistently <60 mL/min signify possible Chronic Kidney Disease.    Anion gap 10 5 - 15    Comment: Performed at [facility], [address] [place], [place] [address]  ANA w/Reflex if Positive     Status: None   Collection Time: 08/16/17  4:19 AM  Result Value Ref Range   Anti Nuclear Antibody(ANA)  Negative Negative    Comment: (NOTE) Performed At: [facility] [place], [place] [id number] Rush Farmer MD [id number]   C3 complement     Status: None   Collection Time: 08/16/17  4:19 AM  Result Value Ref Range   C3 Complement 132 82 - 167 mg/dL    Comment: (NOTE) Performed At: [facility] [place], [place] [id number] Rush Farmer MD [id number]   C4 complement     Status: None   Collection Time: 08/16/17  4:19 AM  Result Value Ref Range   Complement C4, Body Fluid 28 14 - 44 mg/dL    Comment: (NOTE) Performed At: [facility] [place], [place] [id number] Rush Farmer MD [id number]   Parathyroid hormone, intact (no Ca)     Status: Abnormal   Collection Time: 08/16/17  4:19 AM  Result Value Ref Range   PTH 115 (H) 15 - 65 pg/mL    Comment: (NOTE) Performed At: [facility] [place], [place] [id number] Rush Farmer MD [id number]   Glucose, capillary     Status: Abnormal   Collection Time: 08/16/17  7:43 AM  Result Value Ref Range   Glucose-Capillary 178 (H) 70 - 99 mg/dL  Urinalysis, Complete w Microscopic     Status: Abnormal   Collection Time: 08/16/17  9:37 AM  Result Value Ref Range   Color, Urine AMBER (A) YELLOW    Comment: BIOCHEMICALS MAY BE AFFECTED BY COLOR   APPearance CLOUDY (A) CLEAR   Specific Gravity, Urine 1.018 1.005 - 1.030   pH 5.0 5.0 - 8.0   Glucose, UA NEGATIVE NEGATIVE mg/dL   Hgb urine dipstick MODERATE (A) NEGATIVE   Bilirubin Urine NEGATIVE NEGATIVE   Ketones, ur 5 (A) NEGATIVE mg/dL   Protein, ur 30 (A) NEGATIVE mg/dL   Nitrite NEGATIVE NEGATIVE   Leukocytes, UA LARGE (A) NEGATIVE   RBC / HPF 6-10 0 - 5 RBC/hpf   WBC, UA >50 (H) 0 - 5 WBC/hpf   Bacteria, UA RARE (A) NONE SEEN   Squamous Epithelial / LPF 0-5 0 - 5   WBC Clumps PRESENT    Mucus PRESENT     Comment: Performed at [facility], [address]  [place], [place] [address]  Urine Culture     Status: Abnormal   Collection Time: 08/16/17  9:37 AM  Result Value Ref Range   Specimen Description      URINE, RANDOM Performed at [facility], [address] [place], [place] [address]    Special Requests      NONE Performed at [facility], [address], [place], [place] [address]    Culture (A)     <10,000 COLONIES/mL INSIGNIFICANT GROWTH Performed at [facility], [address] [place], [place] [address]    Report Status 08/17/2017 FINAL   Glucose, capillary     Status: Abnormal  Collection Time: 08/16/17 11:46 AM  Result Value Ref Range   Glucose-Capillary 249 (H) 70 - 99 mg/dL  Glucose, capillary     Status: Abnormal   Collection Time: 08/16/17  5:24 PM  Result Value Ref Range   Glucose-Capillary 174 (H) 70 - 99 mg/dL  Glucose, capillary     Status: Abnormal   Collection Time: 08/16/17  9:05 PM  Result Value Ref Range   Glucose-Capillary 168 (H) 70 - 99 mg/dL  Basic metabolic panel     Status: Abnormal   Collection Time: 08/17/17  7:32 AM  Result Value Ref Range   Sodium 140 135 - 145 mmol/L   Potassium 3.9 3.5 - 5.1 mmol/L   Chloride 112 (H) 98 - 111 mmol/L    Comment: Please note change in reference range.   CO2 21 (L) 22 - 32 mmol/L   Glucose, Bld 162 (H) 70 - 99 mg/dL    Comment: Please note change in reference range.   BUN 33 (H) 8 - 23 mg/dL    Comment: Please note change in reference range.   Creatinine, Ser 2.07 (H) 0.61 - 1.24 mg/dL   Calcium 7.5 (L) 8.9 - 10.3 mg/dL   GFR calc non Af Amer 31 (L) >60 mL/min   GFR calc Af Amer 36 (L) >60 mL/min    Comment: (NOTE) The eGFR has been calculated using the CKD EPI equation. This calculation has not been validated in all clinical situations. eGFR's persistently <60 mL/min signify possible Chronic Kidney Disease.    Anion gap 7 5 - 15    Comment: Performed at [facility], [address] [place], [place] [address]  Iron  and TIBC     Status: Abnormal   Collection Time: 08/17/17  7:32 AM  Result Value Ref Range   Iron 36 (L) 45 - 182 ug/dL   TIBC NOT CALCULATED 250 - 450 ug/dL   Saturation Ratios NOT CALCULATED 17.9 - 39.5 %   UIBC NOT CALCULATED ug/dL    Comment: Performed at [facility], [address] [place], [place] [address]  Ferritin     Status: Abnormal   Collection Time: 08/17/17  7:32 AM  Result Value Ref Range   Ferritin 1,217 (H) 24 - 336 ng/mL    Comment: Performed at [facility], [address] [place], [place] [address]  Transferrin     Status: Abnormal   Collection Time: 08/17/17  7:32 AM  Result Value Ref Range   Transferrin <70 (L) 180 - 329 mg/dL    Comment: REPEATED TO VERIFY Performed at [facility], [place] [address] [place], [place] [address]   Hemoglobin     Status: Abnormal   Collection Time: 08/17/17  7:32 AM  Result Value Ref Range   Hemoglobin 9.3 (L) 13.0 - 18.0 g/dL    Comment: Performed at [facility], [address] [place], [place] [address]  Glucose, capillary     Status: Abnormal   Collection Time: 08/17/17  7:39 AM  Result Value Ref Range   Glucose-Capillary 139 (H) 70 - 99 mg/dL  Glucose, capillary     Status: Abnormal   Collection Time: 08/17/17 12:19 PM  Result Value Ref Range   Glucose-Capillary 164 (H) 70 - 99 mg/dL  Glucose, capillary     Status: Abnormal   Collection Time: 08/17/17  4:46 PM  Result Value Ref Range   Glucose-Capillary 122 (H) 70 - 99 mg/dL   Recent Results (from  the past 240 hour(s))  Urine Culture     Status: Abnormal   Collection Time: 08/14/17 10:56 AM  Result Value Ref Range Status   Specimen Description   Final    URINE, RANDOM Performed at [facility], [address] [place], [place] [address]    Special Requests   Final    NONE Performed at [facility], [address] [place], [place] [address]    Culture MULTIPLE SPECIES PRESENT, SUGGEST RECOLLECTION (A)   Final   Report Status 08/15/2017 FINAL  Final  CULTURE, BLOOD (ROUTINE X 2) w Reflex to ID Panel     Status: None (Preliminary result)   Collection Time: 08/14/17  9:23 PM  Result Value Ref Range Status   Specimen Description BLOOD BLOOD RIGHT WRIST  Final   Special Requests   Final    BOTTLES DRAWN AEROBIC AND ANAEROBIC Blood Culture adequate volume   Culture   Final    NO GROWTH 3 DAYS Performed at [facility], [address], [place], [place] [address]    Report Status PENDING  Incomplete  CULTURE, BLOOD (ROUTINE X 2) w Reflex to ID Panel     Status: None (Preliminary result)   Collection Time: 08/14/17  9:23 PM  Result Value Ref Range Status   Specimen Description BLOOD BLOOD LEFT WRIST  Final   Special Requests   Final    BOTTLES DRAWN AEROBIC AND ANAEROBIC Blood Culture adequate volume   Culture   Final    NO GROWTH 3 DAYS Performed at [facility], [address] [place], [place] [address]    Report Status PENDING  Incomplete  Urine Culture     Status: Abnormal   Collection Time: 08/16/17  9:37 AM  Result Value Ref Range Status   Specimen Description   Final    URINE, RANDOM Performed at [facility], [address], [place], [place] [address]    Special Requests   Final    NONE Performed at [facility], [address], [place], [place] [address]    Culture (A)  Final    <10,000 COLONIES/mL INSIGNIFICANT GROWTH Performed at [facility], [place] [address] [place], [place] [address]    Report Status 08/17/2017 FINAL  Final   Creatinine: Recent Labs    08/14/17 1003 08/15/17 0451 08/16/17 0419 08/17/17 0732  CREATININE 3.16* 3.26* 3.06* 2.07*   Baseline Creatinine:  1.3-1.5  Impression/Assessment:  71yo with left pyelonephritis, mild left hydronephrosis and BPH with incomplete emptyin  Plan:  1. I discussed the causes of UTIs with pyelopnephritis with this patient and likely his UTI was caused by incomplete  bladder emptying. His mild hydronephrosis is likely related to his pyelonephritis. We will have him followup in 2 weeks with [facility] for upper tract imaging to ensure resolution of his hydronephrosis. Please continue him on 2 weeks of culture specific antibiotics for his pyelonephritis.  2. BPH with LUTS: I discussed the management with the patient including medical therapy and surgical intervention. We discussed alpha blocker therapy and the patient wishes to pursue this treatment. We will start flomax 0.26m QHS.  3. Gross hematuria: The patient will need a formal hematuria workup as an outpatient.  PNicolette Bang7/13/2019, 6:51 PM

## 2017-08-19 ENCOUNTER — Other Ambulatory Visit: Payer: Self-pay | Admitting: *Deleted

## 2017-08-19 DIAGNOSIS — I10 Essential (primary) hypertension: Secondary | ICD-10-CM

## 2017-08-19 LAB — PROTEIN ELECTROPHORESIS, SERUM
A/G Ratio: 0.5 — ABNORMAL LOW (ref 0.7–1.7)
ALBUMIN ELP: 1.9 g/dL — AB (ref 2.9–4.4)
ALPHA-1-GLOBULIN: 0.4 g/dL (ref 0.0–0.4)
ALPHA-2-GLOBULIN: 1.3 g/dL — AB (ref 0.4–1.0)
Beta Globulin: 0.8 g/dL (ref 0.7–1.3)
GLOBULIN, TOTAL: 3.8 g/dL (ref 2.2–3.9)
Gamma Globulin: 1.3 g/dL (ref 0.4–1.8)
TOTAL PROTEIN ELP: 5.7 g/dL — AB (ref 6.0–8.5)

## 2017-08-19 LAB — CULTURE, BLOOD (ROUTINE X 2)
CULTURE: NO GROWTH
Culture: NO GROWTH
SPECIAL REQUESTS: ADEQUATE
SPECIAL REQUESTS: ADEQUATE

## 2017-08-19 LAB — GLOMERULAR BASEMENT MEMBRANE ANTIBODIES: GBM Ab: 2 units (ref 0–20)

## 2017-08-19 LAB — PROTEIN ELECTRO, RANDOM URINE
ALBUMIN ELP UR: 11.2 %
ALPHA-1-GLOBULIN, U: 3.1 %
ALPHA-2-GLOBULIN, U: 21.9 %
Beta Globulin, U: 28.1 %
Gamma Globulin, U: 35.7 %
Total Protein, Urine: 67.4 mg/dL

## 2017-08-19 NOTE — Patient Outreach (Signed)
[facility] Care Management referral from inpatient DM coordinator. Referral received later in the day on Friday 08/16/17. However, unable to contact at that time.  Chart reviewed. Patient was discharged over the weekend.  Telephone call made to speak with Mr. Duford to discuss [facility] follow up for DM education. Mr. Vallance is agreeable and verbal consent obtained for [facility] services.   Mr. Chery endorses he lives with his wife. Denies any concerns with obtaining medications or with transportation. Confirmed Primary Care MD is Dr. Caryl Bis Velora Heckler  Confirms best contact number is (home) [phone] and [phone] (cell)  Agreeable to [place] follow up for DM education.   Marthenia Rolling, MSN-Ed, RN,BSN [facility] Liaison [phone]

## 2017-08-20 ENCOUNTER — Telehealth: Payer: Self-pay | Admitting: Family Medicine

## 2017-08-20 ENCOUNTER — Telehealth: Payer: Self-pay

## 2017-08-20 NOTE — Telephone Encounter (Signed)
Copied from [place] [id number]. Topic: General - Other >> Aug 20, 2017  4:21 PM Neva Seat wrote: Pt needing a sooner Hosptial F/U visit than the Aug 2 visit.

## 2017-08-20 NOTE — Telephone Encounter (Signed)
Tried to reach patient by phone for TCM voicemail has not been setup.

## 2017-08-20 NOTE — Telephone Encounter (Signed)
First 09/06/17 not with in TCM of 14 days from DC  Please advise of appointment time.

## 2017-08-20 NOTE — Progress Notes (Signed)
08/21/2017 4:35 PM   [name] 23-Mar-1945 [id number]  Referring provider: Leone Haven, MD [address] [place], [place] [address]  Chief Complaint  Patient presents with  . Hospitalization Follow-up  . Hematuria    HPI: Patient is a 72 -year-old [place] American male who presents today as a referral from [facility] for gross hematuria, left hydronephrosis, pyelonephrosis and BPH with LU TS.    Hospital Course    Mr Dugdale is a 72yo with a hx of CAD, CHF, HTN, and colon cancer admitted 7/10 with UTI and pyelonephritis. He underwent CT which showed mild left hydronephrosis to the level of the UVJ and perinephric stranding and debris consistent with pyelonephritis. Per the patient his is his firs t UTI. At baseline he has severe LUTS. He has frequency every 1-1.5 hours, urgency, urge incontinence, nocturia 3-4, a feeling of incomplete emptying. He had gross hematuria 10 days ago. No dysuria. No hx of nephrolithiasis.  In 2015 he underwent colon resection followed by chemotherapy. His LUTS at that time were mild and shortly after he finished his chemo regiment his LUTS became severe. He has never been on a BPH med. Creatinine on admission was 3 and currently it is  2.07.  discussed the causes of UTIs with pyelonephritis with this patient and likely his UTI was caused by incomplete bladder emptying. His mild hydronephrosis is likely related to his pyelonephritis. We will have him followup in 2 weeks with [facility] for upper tract imaging to ensure resolution of his hydronephrosis. Please continue him on 2 weeks of culture specific antibiotics for his pyelonephritis.  2. BPH with LUTS: I discussed the management with the patient including medical therapy and surgical intervention. We discussed alpha blocker therapy and the patient wishes to pursue this treatment. We will start flomax 0.4mg  QHS. 3. Gross hematuria: The patient will need a formal hematuria workup as an  outpatient.  He does not have a prior history of recurrent urinary tract infections, nephrolithiasis, trauma to the genitourinary tract, BPH or malignancies of the genitourinary tract.   He does not have a family medical history of nephrolithiasis, malignancies of the genitourinary tract or hematuria.   He is having symptoms of nocturia and incontinence.  His UA today demonstrates 11-30 WBC's, 3-10 RBC's and many bacteria.  He is currently on Keflex.    Patient denies any dysuria or suprapubic/flank pain.  Patient denies any fevers, chills, nausea or vomiting.    RUS on 08/14/2017 noted a 2.6 x 1.7 x 2.1 cm solid masslike abnormality suggested of the upper pole the left kidney for which dedicated CT or MRI using renal protocol is recommended for further characterization.  Stable bilateral renal cysts are otherwise noted. There is mild fullness of left renal collecting system that may represent mild hydronephrosis.  Rounded echogenic nonshadowing subcentimeter lesions of the spleen more commonly associated with hemangiomata. No additional workup required given nonaggressive features.  Non contrast CT on 08/15/2017 noted mild left hydronephroureter. No obstructing stone identified.  Underlying distal left ureteral lesion is not entirely excluded.  Further evaluation with dedicated CT urography recommended.  Bilateral renal hypodense lesions incompletely evaluated or characterized on this unenhanced CT.   Aortic Atherosclerosis   MRI on 08/16/2017 noted an abnormal expansion of the left kidney with striated appearance suggesting diffuse edema/infiltration. Mildly asymmetric left perirenal stranding. Heterogeneous debris in the left collecting system/dilated proximal left ureter. The striated appearance is disproportionate to the degree of hydronephrosis, and  I am suspicious for pyelonephritis and possibly pyonephrosis. Infiltrating renal malignancy is possible but less likely. No expansion of the left  renal vein to suggest renal vein thrombosis as a alternative etiology.  Bilateral renal cysts. A lesion anteriorly in the left kidney lower pole is technically nonspecific and clearly complex. This could be a complex cyst or mass.  Cholelithiasis with sludge in the gallbladder.  Mild cardiomegaly with trace bilateral pleural effusions.   Scattered small T2 signal hyperintensities in the spleen,nonspecific but stable from 06/19/2013 hence probably benign.  Aortic Atherosclerosis   He is not a smoker. He is not exposed to secondhand smoke.  He has worked with [employer], trichloroethylene, etc. He has HTN.    PMH: Past Medical History:  Diagnosis Date  . Anemia   . CAD (coronary artery disease)    a. 02/2015 Cath: LM nl, LAD 50/40 mid/distal, LCX 80p, RCA 40p, 30d, RPL1 40, CO 3.27, CI 1.65; b. cath 06/09/15: LM minor irregs, m-dLAD 50%, dLAD 40%, pLCx 80% s/p PCI/DES 0%, pRCA 40%, dRCA 30%, 1st RPLB 40%  . Chronic systolic CHF (congestive heart failure) ([place])    a. 02/2015 Echo: EF 20-25%, diff HK, mod MR, mildy dil LA, mildly dil RV w/ mod RV syst dysfxn, mildly dil RA, mod TR, PASP 8mmHg.  . Colon cancer ([place])    a. 2015 s/p colectomy followed by chemoRx with oxaliplatin & fluorouacil.  Marland Kitchen Hypertension   . Hypertensive heart disease   . Mixed Ischemic and Non-ischemic Cardiomyopathy    a. 02/2015 Echo: EF 20-25%, diff HK.  . Moderate mitral regurgitation   . Moderate tricuspid regurgitation   . Tubular adenoma of colon     Surgical History: Past Surgical History:  Procedure Laterality Date  . CARDIAC CATHETERIZATION Bilateral 02/24/2015   Procedure: Right/Left Heart Cath and Coronary Angiography;  Surgeon: Wellington Hampshire, MD;  Location: [facility];  Service: Cardiovascular;  Laterality: Bilateral;  . CARDIAC CATHETERIZATION N/A 06/09/2015   Procedure: Coronary Stent Intervention;  Surgeon: Wellington Hampshire, MD;  Location: [facility];  Service: Cardiovascular;   Laterality: N/A;  . COLON SURGERY     Colectomy  . COLONOSCOPY    . COLONOSCOPY WITH PROPOFOL N/A 12/11/2016   Procedure: COLONOSCOPY WITH PROPOFOL;  Surgeon: Lollie Sails, MD;  Location: [facility];  Service: Endoscopy;  Laterality: N/A;  . ESOPHAGOGASTRODUODENOSCOPY    . PORT-A-CATH REMOVAL N/A 07/12/2014   Procedure: REMOVAL PORT-A-CATH;  Surgeon: Molly Maduro, MD;  Location: [facility];  Service: General;  Laterality: N/A;  . PORTACATH PLACEMENT Left     Home Medications:  Allergies as of 08/21/2017      Reactions   Shellfish Allergy Other (See Comments)   Pt. instructed by MD to avoid seafood      Medication List        Accurate as of 08/21/17  4:35 PM. Always use your most recent med list.          aspirin EC 81 MG tablet Take 1 tablet (81 mg total) by mouth daily.   atorvastatin 40 MG tablet Commonly known as:  LIPITOR take 1 tablet by mouth once daily   carvedilol 3.125 MG tablet Commonly known as:  COREG Take 1 tablet (3.125 mg total) by mouth 2 (two) times daily.   cephALEXin 500 MG capsule Commonly known as:  KEFLEX Take 1 capsule (500 mg total) by mouth every 12 (twelve) hours.   diphenhydrAMINE 50 MG tablet Commonly known  as:  BENADRYL Take one tablet one hour prior to CT   gabapentin 100 MG capsule Commonly known as:  NEURONTIN Take 2 capsules (200 mg total) by mouth 3 (three) times daily.   omeprazole 20 MG capsule Commonly known as:  PRILOSEC Take 20 mg by mouth daily.   predniSONE 50 MG tablet Commonly known as:  DELTASONE Take the one tablet 13 hours, 7 hours and 1 hours prior to the CT Urogram   ranitidine 150 MG tablet Commonly known as:  ZANTAC Take one tablet 1 hour prior to CT Urogram   tamsulosin 0.4 MG Caps capsule Commonly known as:  FLOMAX Take 1 capsule (0.4 mg total) by mouth daily after supper.       Allergies:  Allergies  Allergen Reactions  . Shellfish Allergy Other (See Comments)    Pt. instructed by MD  to avoid seafood    Family History: Family History  Problem Relation Age of Onset  . Cancer Mother   . Cancer Father     Social History:  reports that he has never smoked. He has never used smokeless tobacco. He reports that he does not drink alcohol or use drugs.  ROS: UROLOGY Frequent Urination?: No Hard to postpone urination?: No Burning/pain with urination?: No Get up at night to urinate?: Yes Leakage of urine?: Yes Urine stream starts and stops?: No Trouble starting stream?: No Do you have to strain to urinate?: No Blood in urine?: No Urinary tract infection?: No Sexually transmitted disease?: No Injury to kidneys or bladder?: No Painful intercourse?: No Weak stream?: No Erection problems?: No Penile pain?: No  Gastrointestinal Nausea?: No Vomiting?: No Indigestion/heartburn?: No Diarrhea?: Yes Constipation?: No  Constitutional Fever: No Night sweats?: No Weight loss?: No Fatigue?: No  Skin Skin rash/lesions?: No Itching?: No  Eyes Blurred vision?: No Double vision?: No  Ears/Nose/Throat Sore throat?: No Sinus problems?: No  Hematologic/Lymphatic Swollen glands?: No Easy bruising?: No  Cardiovascular Leg swelling?: Yes Chest pain?: No  Respiratory Cough?: Yes Shortness of breath?: No  Endocrine Excessive thirst?: No  Musculoskeletal Back pain?: No Joint pain?: No  Neurological Headaches?: No Dizziness?: Yes  Psychologic Depression?: No Anxiety?: No  Physical Exam: BP (!) 144/77   Pulse 93   Wt 181 lb (82.1 kg)   BMI 26.73 kg/m   Constitutional:  Well nourished. Alert and oriented, No acute distress. HEENT: [place] AT, moist mucus membranes.  Trachea midline, no masses. Cardiovascular: No clubbing, cyanosis, or edema. Respiratory: Normal respiratory effort, no increased work of breathing. GI: Abdomen is soft, non tender, non distended, no abdominal masses. Liver and spleen not palpable.  No hernias appreciated.  Stool sample  for occult testing is not indicated.   GU: No CVA tenderness.  No bladder fullness or masses.  Patient with uncircumcised phallus.  Foreskin easily retracted  Vitiligo is present.   Urethral meatus is patent.  No penile discharge. No penile lesions or rashes. Scrotum without lesions, cysts, rashes and/or edema.  Testicles are located scrotally bilaterally. No masses are appreciated in the testicles. Left and right epididymis are normal.  Left spermatocele vs hydrocele Rectal: Patient with  normal sphincter tone. Anus and perineum without scarring or rashes. No rectal masses are appreciated. Prostate is approximately 45 grams, 10 mm x 10 mm nodule in left lobe is appreciated. Seminal vesicles are normal. Skin: No rashes, bruises or suspicious lesions. Lymph: No cervical or inguinal adenopathy. Neurologic: Grossly intact, no focal deficits, moving all 4 extremities. Psychiatric: Normal mood and  affect.  Laboratory Data: Lab Results  Component Value Date   WBC 8.4 08/15/2017   HGB 9.3 (L) 08/17/2017   HCT 28.2 (L) 08/15/2017   MCV 94.0 08/15/2017   PLT 141 (L) 08/15/2017    Lab Results  Component Value Date   CREATININE 2.07 (H) 08/17/2017    No results found for: PSA  No results found for: TESTOSTERONE  Lab Results  Component Value Date   HGBA1C 7.8 (H) 08/15/2017    Lab Results  Component Value Date   TSH 1.98 02/10/2015       Component Value Date/Time   CHOL 143 08/02/2016 1110   HDL 69 08/02/2016 1110   CHOLHDL 2.1 08/02/2016 1110   LDLCALC 64 08/02/2016 1110    Lab Results  Component Value Date   AST 35 08/14/2017   Lab Results  Component Value Date   ALT 41 08/14/2017   No components found for: ALKALINEPHOPHATASE No components found for: BILIRUBINTOTAL  No results found for: ESTRADIOL   Urinalysis See hpi and epic  I have reviewed the labs.  Pertinent Imaging: CLINICAL DATA:  Acute renal failure, history of colon cancer.  EXAM: RENAL / URINARY  TRACT ULTRASOUND COMPLETE  COMPARISON:  CT 01/12/2016  FINDINGS: Right Kidney:  Length: 10.3 cm. Two adjacent cysts are redemonstrated in the lower pole the right kidney 1 slightly exophytic measuring 1.1 x 1.1 x 1 cm, similar in size to prior CT. Adjacent more cortically based cyst measures 2.1 x 2.2 x 2.8 cm also similar in size. No nephrolithiasis nor hydronephrosis. Cortical-medullary distinction is maintained.  Left Kidney:  Length: 11.8 cm. Echogenicity within normal limits. Heterogeneous appearance of the upper pole of the left kidney with subtle masslike abnormality suggested measuring 2.6 x 1.7 x 2.1 cm. Dedicated renal protocol CT or MRI is recommended without and with IV contrast. Mild dilatation of the intrarenal collecting system compatible with mild hydronephrosis or ectasia is noted. A cyst similar in size to prior CT is noted in the lower pole measuring 1.5 x 1.5 x 1.6 cm  Bladder:  Appears normal for degree of bladder distention.  Other: At least 4 subcentimeter rounded nonshadowing echogenic lesions are identified of the normal sized spleen more commonly associated with hemangiomata given their echogenicity. No worrisome features are noted. No additional workup required per consensus guidelines.  IMPRESSION: 1. A 2.6 x 1.7 x 2.1 cm solid masslike abnormality suggested of the upper pole the left kidney for which dedicated CT or MRI using renal protocol is recommended for further characterization. 2. Stable bilateral renal cysts are otherwise noted. There is mild fullness of left renal collecting system that may represent mild hydronephrosis. 3. Rounded echogenic nonshadowing subcentimeter lesions of the spleen more commonly associated with hemangiomata. No additional workup required given nonaggressive features.   Electronically Signed   By: Ashley Royalty M.D.   On: 08/14/2017 14:02  CLINICAL DATA:  72 year old male with diagnosis of acute  renal failure and UTI. Possible renal mass was seen on the earlier ultrasound.  EXAM: CT ABDOMEN AND PELVIS WITHOUT CONTRAST  TECHNIQUE: Multidetector CT imaging of the abdomen and pelvis was performed following the standard protocol without IV contrast.  COMPARISON:  Renal ultrasound dated 08/14/2017 and CT of the abdomen pelvis dated 01/12/2016.  FINDINGS: Evaluation of this exam is limited in the absence of intravenous contrast.  Lower chest: Partially visualized trace bilateral pleural effusions. There is subsegmental bibasilar densities which may represent atelectatic changes although infiltrate is not entirely excluded.  Clinical correlation is recommended. There is borderline cardiomegaly. There is hypoattenuation of the cardiac blood pool suggestive of a degree of anemia. Clinical correlation is recommended.  No intra-abdominal free air or free fluid.  Hepatobiliary: The liver is grossly unremarkable on this noncontrast CT. No biliary ductal dilatation. There is probable small stones in the neck of the gallbladder. No pericholecystic fluid.  Pancreas: Unremarkable. No pancreatic ductal dilatation or surrounding inflammatory changes.  Spleen: The spleen is grossly unremarkable on this noncontrast CT. The described lesion seen on the earlier ultrasound is not well visualized in the absence of contrast.  Adrenals/Urinary Tract: The adrenal glands are unremarkable. There are bilateral renal hypodense lesions measuring up to 2 cm in the inferior pole of the right kidney. These lesions are not characterized on this unenhanced CT. The left renal upper pole lesion described on the earlier ultrasound is not well visualized or evaluated on this noncontrast CT. Further characterization with dedicated CT or MRI using renal mass protocol is recommended. There is mild left hydronephrosis as well as mild left hydroureter. A transition zone is seen in the distal left  ureter. No definite obstructing stone identified. Although this may be related to a recently passed left renal calculus, an underlying urothelial lesion/neoplasm is not excluded. Further evaluation with dedicated CT urography is recommended. There is no hydronephrosis or nephrolithiasis on the right. The urinary bladder is predominantly collapsed.  Stomach/Bowel: There is a small hiatal hernia with gastroesophageal reflux. Oral contrast opacifies the stomach and multiple loops of small bowel and traverses into the colon. There is no bowel obstruction or active inflammation. Appendectomy.  Vascular/Lymphatic: There is moderate aortoiliac atherosclerotic disease. The abdominal aorta and IVC are otherwise grossly unremarkable on this noncontrast CT. No portal venous gas. There is no adenopathy.  Reproductive: The prostate and seminal vesicles are grossly unremarkable as visualized.  Other: Partially visualized left hydrocele. There is a midline vertical anterior pelvic wall incisional scar.  Musculoskeletal: Degenerative changes of the spine. No acute osseous pathology.  IMPRESSION: 1. Mild left hydronephroureter. No obstructing stone identified. Underlying distal left ureteral lesion is not entirely excluded. Further evaluation with dedicated CT urography recommended. 2. Bilateral renal hypodense lesions incompletely evaluated or characterized on this unenhanced CT. 3.  Aortic Atherosclerosis (ICD10-I70.0).   Electronically Signed   By: Anner Crete M.D.   On: 08/15/2017 22:12  CLINICAL DATA:  Weakness and abdominal cramping. Acute renal failure. Colon cancer with prior colectomy. Coronary artery disease with prior bypass. Left renal mass.  EXAM: MRI ABDOMEN WITHOUT CONTRAST  TECHNIQUE: Multiplanar multisequence MR imaging was performed without the administration of intravenous contrast. Contrast could not be administered, the patient's GFR is  21.  COMPARISON:  08/15/2017  FINDINGS: Despite efforts by the technologist and patient, motion artifact is present on today's exam and could not be eliminated. This reduces exam sensitivity and specificity. This is a common result when abdominal MRI is attempted in the inpatient setting where patients are less likely to be able to breath hold and cooperate in controlling motion.  Lower chest: Trace bilateral pleural effusions.  Mild cardiomegaly.  Hepatobiliary: Sludge is present in the gallbladder along with a suspected 1.5 cm gallstone on image 21/10. The liver appears otherwise unremarkable where included.  Pancreas:  Unremarkable  Spleen: The spleen measures 13.3 by 5.3 by 11.2 cm (volume = 410 cm^3). There are approximately 10 small foci of T2 signal hyperintensity in the spleen which are technically nonspecific, but which appear to of been present  on prior exams such as 01/12/2016 and 06/19/2013, without significant progression.  Adrenals/Urinary Tract: Expanded size the left kidney with suspected diffuse edema/infiltration based on accentuated T2 signal and striated appearance of the kidney for example on image 10/5. Left hydroureter and mild hydronephrosis. The striated appearance of the left kidney appears disproportionate to the degree of hydronephrosis. Heterogeneous T2 signal in the left kidney. Bilateral fluid signal intensity lesions in the right kidney appear roughly stable compared back to cysts seen on 06/19/2013. Left kidney lower pole cyst on image 16/5 likewise similar to remote prior exams. A 2.0 by 1.4 cm lesion anteriorly in the left kidney lower pole on image 15/5 is nonspecific. There is some heterogeneity of the fluid in the left collecting system, for example on image 17/4.  Stomach/Bowel: Unremarkable  Vascular/Lymphatic:  Aortoiliac atherosclerotic vascular disease.  Other: Mild bilateral perirenal stranding, left greater than  right, with edema tracking along the retroperitoneum and paraspinal musculature bilaterally. Some low-level edema tracking along the bilateral flank musculature is well.  Musculoskeletal: Unremarkable  IMPRESSION: 1. Abnormal expansion of the left kidney with striated appearance suggesting diffuse edema/infiltration. Mildly asymmetric left perirenal stranding. Heterogeneous debris in the left collecting system/dilated proximal left ureter. The striated appearance is disproportionate to the degree of hydronephrosis, and I am suspicious for pyelonephritis and possibly pyonephrosis. Infiltrating renal malignancy is possible but less likely. No expansion of the left renal vein to suggest renal vein thrombosis as a alternative etiology. 2. Bilateral renal cysts. A lesion anteriorly in the left kidney lower pole is technically nonspecific and clearly complex. This could be a complex cyst or mass. 3. Cholelithiasis with sludge in the gallbladder. 4. Mild cardiomegaly with trace bilateral pleural effusions. 5. Scattered small T2 signal hyperintensities in the spleen, nonspecific but stable from 06/19/2013 hence probably benign. 6.  Aortic Atherosclerosis (ICD10-I70.0).   Electronically Signed   By: Van Clines M.D.   On: 08/16/2017 09:00  Assessment & Plan:    1. Gross hematuria Explained to the patient that there are a number of causes that can be associated with blood in the urine, such as stones, BPH, UTI's, damage to the urinary tract and/or cancer. At this time, I felt that the patient warranted further urologic evaluation.   The AUA guidelines state that a CT urogram is the preferred imaging study to evaluate hematuria. I explained to the patient that a contrast material will be injected into a vein and that in rare instances, an allergic reaction can result and may even life threatening   The patient has an allergy to shellfish.  Allergy prep is given.  The patient  denies any allergies to contrast or iodine and is not taking metformin. Explained to the patient that the imaging studies performed in the hospital are not the recommended studies by the AUA for the work-up of blood in the urine.    The imaging studies that were performed lack the detail of excluding some urological tumors. The recommended study is a CT urogram.  This study does require the use of contrast material.     Following the imaging study,  I've recommended a cystoscopy. I described how this is performed, typically in an office setting with a flexible cystoscope. We described the risks, benefits, and possible side effects, the most common of which is a minor amount of blood in the urine and/or burning which usually resolves in 24 to 48 hours.   The patient had the opportunity to ask questions which were answered. Based  upon this discussion, the patient is willing to proceed. Therefore, I've ordered: a CT Urogram and cystoscopy.  The patient will return following all of the above for discussion of the results.  UA 11-30 WBC's, 3-10 RBC's and many bacteria Urine culture pending BUN + creatinine pending  2. Left renal mass CTU pending  3. Left hydronephrosis CTU pending  4. BPH with LUTS Continue conservative management, avoiding bladder irritants and timed voiding's Continue tamsulosin 0.4 mg daily   5. Spermatocele vs hydrocele Will obtain scrotal ultrasound for confirmation as patient does have a history of colon cancer and there is a slight chance this may be metastases  6. Prostate nodule PSA drawn today  Concerning for prostate cancer, will address once hematuria work-up is completed  Return for CT Urogram, scrotal US report and cystoscopy.  These notes generated with voice recognition software. I apologize for typographical errors.  Zara Council, PA-C  [facility] [address] [place]  [place], [place] [address] [phone]

## 2017-08-21 ENCOUNTER — Encounter: Payer: Self-pay | Admitting: Urology

## 2017-08-21 ENCOUNTER — Ambulatory Visit (INDEPENDENT_AMBULATORY_CARE_PROVIDER_SITE_OTHER): Payer: PPO | Admitting: Urology

## 2017-08-21 VITALS — BP 144/77 | HR 93 | Wt 181.0 lb

## 2017-08-21 DIAGNOSIS — N401 Enlarged prostate with lower urinary tract symptoms: Secondary | ICD-10-CM | POA: Diagnosis not present

## 2017-08-21 DIAGNOSIS — R310 Gross hematuria: Secondary | ICD-10-CM | POA: Diagnosis not present

## 2017-08-21 DIAGNOSIS — N1330 Unspecified hydronephrosis: Secondary | ICD-10-CM

## 2017-08-21 DIAGNOSIS — N402 Nodular prostate without lower urinary tract symptoms: Secondary | ICD-10-CM | POA: Diagnosis not present

## 2017-08-21 DIAGNOSIS — N138 Other obstructive and reflux uropathy: Secondary | ICD-10-CM

## 2017-08-21 DIAGNOSIS — N2889 Other specified disorders of kidney and ureter: Secondary | ICD-10-CM

## 2017-08-21 DIAGNOSIS — N433 Hydrocele, unspecified: Secondary | ICD-10-CM

## 2017-08-21 LAB — URINALYSIS, COMPLETE
Bilirubin, UA: NEGATIVE
GLUCOSE, UA: NEGATIVE
KETONES UA: NEGATIVE
NITRITE UA: NEGATIVE
SPEC GRAV UA: 1.015 (ref 1.005–1.030)
UUROB: 0.2 mg/dL (ref 0.2–1.0)
pH, UA: 5 (ref 5.0–7.5)

## 2017-08-21 LAB — MICROSCOPIC EXAMINATION

## 2017-08-21 MED ORDER — PREDNISONE 50 MG PO TABS: ORAL_TABLET | ORAL | 0 refills | Status: DC

## 2017-08-21 MED ORDER — RANITIDINE HCL 150 MG PO TABS: ORAL_TABLET | ORAL | 0 refills | Status: DC

## 2017-08-21 MED ORDER — DIPHENHYDRAMINE HCL 50 MG PO TABS: ORAL_TABLET | ORAL | 0 refills | Status: DC

## 2017-08-21 NOTE — Telephone Encounter (Signed)
This is the only time I saw but out window for TCM.

## 2017-08-21 NOTE — Telephone Encounter (Signed)
Tried to reach patient on home phone for TCM and to reschedule appointment no answer and no voicemail. Mobile number is invalid.

## 2017-08-21 NOTE — Telephone Encounter (Signed)
The patient could be placed on a Monday or Wednesday at 430.

## 2017-08-22 LAB — BUN+CREAT
BUN/Creatinine Ratio: 10 (ref 10–24)
BUN: 14 mg/dL (ref 8–27)
Creatinine, Ser: 1.44 mg/dL — ABNORMAL HIGH (ref 0.76–1.27)
GFR calc Af Amer: 56 mL/min/{1.73_m2} — ABNORMAL LOW (ref >59)
GFR, EST NON AFRICAN AMERICAN: 49 mL/min/{1.73_m2} — AB (ref >59)

## 2017-08-22 NOTE — Telephone Encounter (Signed)
See other phone note

## 2017-08-23 LAB — CULTURE, URINE COMPREHENSIVE

## 2017-08-25 ENCOUNTER — Other Ambulatory Visit: Payer: Self-pay | Admitting: Cardiovascular Disease

## 2017-08-26 ENCOUNTER — Telehealth: Payer: Self-pay

## 2017-08-26 ENCOUNTER — Other Ambulatory Visit: Payer: Self-pay | Admitting: Pharmacist

## 2017-08-26 MED ORDER — SULFAMETHOXAZOLE-TRIMETHOPRIM 800-160 MG PO TABS: 1.0000 | ORAL_TABLET | Freq: Two times a day (BID) | ORAL | 0 refills | Status: AC

## 2017-08-26 NOTE — Telephone Encounter (Signed)
Patient called back gave him the message   Sharyn Lull

## 2017-08-26 NOTE — Telephone Encounter (Signed)
Left message, rx sent into pharmacy.

## 2017-08-26 NOTE — Patient Outreach (Signed)
[place] [facility] [facility]) Care Management  08/26/2017   [name] 12-10-1945 [id number]  Patient is a 72 year old male referred by [place] [facility] for 30 day post discharge medication reconcilliation. PMHx includes but not limited to hypertension, hyperlipidemia, GERD, history of chronic systolic CHF, coronary artery disease status post bypass, history of colon cancer status post colectomy with postoperative chemo.  Subjective:  Patient states he is feeling well. He notes that his wife typically helps organize his medications, and helped him throw out the medications he was to stop after his hospitalization.   Patient was concerned that he had not received "pills for diabetes".   Objective:   Current Medications:  Current Outpatient Medications  Medication Sig Dispense Refill  . aspirin EC 81 MG tablet Take 1 tablet (81 mg total) by mouth daily. 90 tablet 3  . carvedilol (COREG) 3.125 MG tablet take 1 tablet by mouth twice a day 180 tablet 2  . gabapentin (NEURONTIN) 100 MG capsule Take 2 capsules (200 mg total) by mouth 3 (three) times daily. (Patient taking differently: Take 100 mg by mouth 3 (three) times daily. ) 180 capsule 0  . omeprazole (PRILOSEC) 20 MG capsule Take 20 mg by mouth daily.    . tamsulosin (FLOMAX) 0.4 MG CAPS capsule Take 1 capsule (0.4 mg total) by mouth daily after supper. 30 capsule 11  . atorvastatin (LIPITOR) 40 MG tablet take 1 tablet by mouth once daily (Patient not taking: Reported on 08/26/2017) 30 tablet 0  . BANOPHEN 50 MG capsule take 1 tablet by mouth 1 hour PRIOR TO CT  0  . predniSONE (DELTASONE) 50 MG tablet Take the one tablet 13 hours, 7 hours and 1 hours prior to the CT Urogram (Patient not taking: Reported on 08/26/2017) 3 tablet 0  . ranitidine (ZANTAC) 150 MG tablet Take one tablet 1 hour prior to CT Urogram (Patient not taking: Reported on 08/26/2017) 1 tablet 0   No current facility-administered medications for this visit.      Functional Status:  In your present state of health, do you have any difficulty performing the following activities: 08/14/2017  Hearing? N  Vision? N  Difficulty concentrating or making decisions? N  Walking or climbing stairs? N  Dressing or bathing? N  Doing errands, shopping? N  Some recent data might be hidden    Fall/Depression Screening: No flowsheet data found. No flowsheet data found.  ASSESSMENT: Date Discharged from Hospital: 08/17/2017 Date Medication Reconciliation Performed: 08/26/2017   Medications Discontinued at Discharge:  Amlodipine 10 mg   Furosemide 40 mg   Lisinopril 40 mg   Spironolactone 25 mg   New Medications at Discharge:   Cephalexin 500 mg PO BID x 7 days (completed)   Patient was recently discharged from hospital and all medications have been reviewed  Drugs sorted by system:  Cardiovascular: aspirin 81 mg daily, carvedilol 3.125 mg BID, atorvastatin 40 mg daily  Gastrointestinal: omeprazole 20 mg daily  Genitourinary: tamsulosin 0.4 mg daily  Pain: gabapentin 100 mg TID   Miscellaneous: diphenhydramine 50 mg prior to CT scan, prednisone 50 mg prior to CT scan, ranitidine 150 mg prior to CT scan  PLAN: -Instructed patient to take new medications as prescribed  -Explained to patient that metformin was not started due to renal failure, and that he should follow up with PCP Dr. Caryl Bis regarding diabetes -Reminded patient of his upcoming appointments.  -Routing note to PCP Dr. Moshe Salisbury, PharmD PGY2 Ambulatory  Care Pharmacy Resident Phone: [phone]

## 2017-08-26 NOTE — Telephone Encounter (Signed)
-----   Message from Nori Riis, PA-C sent at 08/25/2017 10:22 PM EDT ----- Please let Mr. Wadhwa know that he has a positive urine culture.  He needs to start Septra DS, twice daily for seven days.

## 2017-08-30 ENCOUNTER — Ambulatory Visit (INDEPENDENT_AMBULATORY_CARE_PROVIDER_SITE_OTHER): Payer: PPO | Admitting: Cardiovascular Disease

## 2017-08-30 ENCOUNTER — Encounter: Payer: Self-pay | Admitting: Cardiovascular Disease

## 2017-08-30 VITALS — BP 136/60 | HR 57 | Ht 69.0 in | Wt 176.0 lb

## 2017-08-30 DIAGNOSIS — I5022 Chronic systolic (congestive) heart failure: Secondary | ICD-10-CM | POA: Diagnosis not present

## 2017-08-30 DIAGNOSIS — E785 Hyperlipidemia, unspecified: Secondary | ICD-10-CM | POA: Diagnosis not present

## 2017-08-30 DIAGNOSIS — I10 Essential (primary) hypertension: Secondary | ICD-10-CM | POA: Diagnosis not present

## 2017-08-30 DIAGNOSIS — I25118 Atherosclerotic heart disease of native coronary artery with other forms of angina pectoris: Secondary | ICD-10-CM

## 2017-08-30 MED ORDER — ATORVASTATIN CALCIUM 40 MG PO TABS: 40.0000 mg | ORAL_TABLET | Freq: Every day | ORAL | 1 refills | Status: DC

## 2017-08-30 MED ORDER — LOSARTAN POTASSIUM 25 MG PO TABS: 25.0000 mg | ORAL_TABLET | Freq: Every day | ORAL | 1 refills | Status: DC

## 2017-08-30 NOTE — Patient Instructions (Addendum)
Medication Instructions: START Losartan 25 mg daily RESTART Atorvastatin 40 mg daily  If you need a refill on your cardiac medications before your next appointment, please call your pharmacy.   Labwork: Your provider would like for you to return in one week to have the following labs drawn: BMET. Please go to the [facility] entrance and check in at the front desk. You do not need an appointment.   Procedures/Testing: Your physician has requested that you have an echocardiogram. Echocardiography is a painless test that uses sound waves to create images of your heart. It provides your doctor with information about the size and shape of your heart and how well your heart's chambers and valves are working. You may receive an ultrasound enhancing agent through an IV if needed to better visualize your heart during the echo.This procedure takes approximately one hour. There are no restrictions for this procedure. This will take place at the [facility] clinic.    Follow-Up: Your physician wants you to follow-up in 3 months with Dr. Fletcher Anon.  Thank you for choosing [facility]!

## 2017-08-30 NOTE — Progress Notes (Signed)
Cardiology Office Note   Date:  08/30/2017   ID:  Christopher Batiz., DOB Apr 07, 1945, MRN [id number]  PCP:  Leone Haven, MD  Cardiologist:   Kathlyn Sacramento, MD   Chief Complaint  Patient presents with  . Other    Past due 6 month follow up. Patient denies chest pain and SOB. Patient was last seen 08/02/2016. Meds reviewed verbally with patient.       History of Present Illness: Christopher Kimmey. is a 72 y.o. male who presents for a follow-up visit regarding chronic systolic heart failure and coronary artery disease. He has known history of colon cancer status post colectomy in 2015 followed by chemotherapy with oxaliplatin and fluorouracil. He had neuropathy as a result.  He is not a smoker and has not had any alcohol in 10 years. He is not diabetic.   He was diagnosed with acute systolic heart failure in January 2017. Echocardiogram showed severely reduced LV systolic function with an ejection fraction of 20-25%, moderate mitral regurgitation, moderate tricuspid regurgitation with moderate to severe pulmonary hypertension. Right and left cardiac catheterization showed moderate to severe pulmonary hypertension with severely elevated filling pressures with severely reduced cardiac output. Coronary angiography showed severe one-vessel coronary artery disease with 80-90% stenosis in the proximal left circumflex with mild diffuse disease affecting the LAD and RCA. He was started on heart failure medications and subsequently underwent staged PCI of the left circumflex in May 2017.  Most recent echocardiogram in December 2017 showed an EF of 35-40%, mild mitral regurgitation and moderately dilated left atrium.  He was hospitalized few weeks ago due to acute renal failure with a creatinine as high as 3 thought to be due to volume depletion, UTI and chronic therapy with lisinopril.  He is now taking furosemide only as needed.  Lisinopril was discontinued. He is doing well overall and  denies any chest pain or shortness of breath.  Past Medical History:  Diagnosis Date  . Anemia   . CAD (coronary artery disease)    a. 02/2015 Cath: LM nl, LAD 50/40 mid/distal, LCX 80p, RCA 40p, 30d, RPL1 40, CO 3.27, CI 1.65; b. cath 06/09/15: LM minor irregs, m-dLAD 50%, dLAD 40%, pLCx 80% s/p PCI/DES 0%, pRCA 40%, dRCA 30%, 1st RPLB 40%  . Chronic systolic CHF (congestive heart failure) ([place])    a. 02/2015 Echo: EF 20-25%, diff HK, mod MR, mildy dil LA, mildly dil RV w/ mod RV syst dysfxn, mildly dil RA, mod TR, PASP 92mmHg.  . Colon cancer ([place])    a. 2015 s/p colectomy followed by chemoRx with oxaliplatin & fluorouacil.  Marland Kitchen Hypertension   . Hypertensive heart disease   . Mixed Ischemic and Non-ischemic Cardiomyopathy    a. 02/2015 Echo: EF 20-25%, diff HK.  . Moderate mitral regurgitation   . Moderate tricuspid regurgitation   . Tubular adenoma of colon     Past Surgical History:  Procedure Laterality Date  . CARDIAC CATHETERIZATION Bilateral 02/24/2015   Procedure: Right/Left Heart Cath and Coronary Angiography;  Surgeon: Wellington Hampshire, MD;  Location: [facility] CV LAB;  Service: Cardiovascular;  Laterality: Bilateral;  . CARDIAC CATHETERIZATION N/A 06/09/2015   Procedure: Coronary Stent Intervention;  Surgeon: Wellington Hampshire, MD;  Location: [facility] CV LAB;  Service: Cardiovascular;  Laterality: N/A;  . COLON SURGERY     Colectomy  . COLONOSCOPY    . COLONOSCOPY WITH PROPOFOL N/A 12/11/2016   Procedure: COLONOSCOPY WITH PROPOFOL;  Surgeon:  Lollie Sails, MD;  Location: [facility] ENDOSCOPY;  Service: Endoscopy;  Laterality: N/A;  . ESOPHAGOGASTRODUODENOSCOPY    . PORT-A-CATH REMOVAL N/A 07/12/2014   Procedure: REMOVAL PORT-A-CATH;  Surgeon: Molly Maduro, MD;  Location: [facility] ORS;  Service: General;  Laterality: N/A;  . PORTACATH PLACEMENT Left      Current Outpatient Medications  Medication Sig Dispense Refill  . aspirin EC 81 MG tablet Take 1 tablet (81 mg total)  by mouth daily. 90 tablet 3  . BANOPHEN 50 MG capsule take 1 tablet by mouth 1 hour PRIOR TO CT  0  . carvedilol (COREG) 3.125 MG tablet take 1 tablet by mouth twice a day 180 tablet 2  . gabapentin (NEURONTIN) 100 MG capsule Take 2 capsules (200 mg total) by mouth 3 (three) times daily. (Patient taking differently: Take 100 mg by mouth 3 (three) times daily. ) 180 capsule 0  . omeprazole (PRILOSEC) 20 MG capsule Take 20 mg by mouth daily.    Marland Kitchen sulfamethoxazole-trimethoprim (BACTRIM DS,SEPTRA DS) 800-160 MG tablet Take 1 tablet by mouth 2 (two) times daily for 7 days. 14 tablet 0  . tamsulosin (FLOMAX) 0.4 MG CAPS capsule Take 1 capsule (0.4 mg total) by mouth daily after supper. 30 capsule 11   No current facility-administered medications for this visit.     Allergies:   Shellfish allergy    Social History:  The patient  reports that he has never smoked. He has never used smokeless tobacco. He reports that he does not drink alcohol or use drugs.   Family History:  The patient's family history includes Cancer in his father and mother.    ROS:  Please see the history of present illness.   Otherwise, review of systems are positive for none.   All other systems are reviewed and negative.    PHYSICAL EXAM: VS:  BP 136/60 (BP Location: Left Arm, Patient Position: Sitting, Cuff Size: Normal)   Pulse (!) 57   Ht 5\' 9"  (1.753 m)   Wt 176 lb (79.8 kg)   BMI 25.99 kg/m  , BMI Body mass index is 25.99 kg/m. GEN: Well nourished, well developed, in no acute distress  HEENT: normal  Neck: no JVD, carotid bruits, or masses Cardiac: RRR; no murmurs, rubs, or gallops,no edema  Respiratory:  clear to auscultation bilaterally, normal work of breathing GI: soft, nontender, nondistended, + BS MS: no deformity or atrophy  Skin: warm and dry, no rash Neuro:  Strength and sensation are intact Psych: euthymic mood, full affect   EKG:  EKG is ordered today.   EKG shows Sinus bradycardia with a PVC  and nonspecific T wave changes.   Recent Labs: 08/14/2017: ALT 41 08/15/2017: Platelets 141 08/17/2017: Hemoglobin 9.3; Potassium 3.9; Sodium 140 08/21/2017: BUN 14; Creatinine, Ser 1.44    Lipid Panel    Component Value Date/Time   CHOL 143 08/02/2016 1110   TRIG 48 08/02/2016 1110   HDL 69 08/02/2016 1110   CHOLHDL 2.1 08/02/2016 1110   LDLCALC 64 08/02/2016 1110      Wt Readings from Last 3 Encounters:  08/30/17 176 lb (79.8 kg)  08/21/17 181 lb (82.1 kg)  08/14/17 177 lb 5 oz (80.4 kg)        ASSESSMENT AND PLAN:  1.  Coronary artery disease involving native coronary arteries with other forms of angina:  He is status post PCI and drug-eluting stent placement to the left circumflex  : He is doing well overall.  Continue medical  therapy.  2. Chronic systolic heart failure:  He appears to be euvolemic . He is currently New York Heart Association class II.  most recent ejection fraction was 35-40%. Not able to increase the dose of carvedilol due to relative bradycardia. He had recent acute renal failure and currently he is using furosemide only as needed.  He does not require this very often. ACE inhibitor was discontinued during that admission. I elected to start him on losartan 25 mg once daily.  Check basic metabolic profile in 1 week. I am going to obtain an echocardiogram to reevaluate his ejection fraction and consider switching to Entresto if EF is less than 40%.  3. Essential hypertension: Blood pressure is reasonably controlled.  4. Hyperlipidemia: He is no longer taking atorvastatin for unclear reasons.  I resumed this at 40 mg daily.    Disposition:   FU with me in 3 months  Signed,  Kathlyn Sacramento, MD  08/30/2017 2:04 PM    [place]

## 2017-09-03 ENCOUNTER — Other Ambulatory Visit: Payer: Self-pay | Admitting: *Deleted

## 2017-09-03 NOTE — Patient Outreach (Signed)
[place] [facility]) Care Management  09/03/2017  [name] [birth year] [id number]   RN Health Coach attempted #1 follow up outreach call to patient Home number is dosconnected.  Patient was unavailable. HIPPA compliance voicemail message left on mobile number with return callback number.  Plan: RN will call patient again within 3-5 business days.  [place] Care Management [phone]

## 2017-09-05 ENCOUNTER — Other Ambulatory Visit: Payer: Self-pay | Admitting: *Deleted

## 2017-09-05 NOTE — Patient Outreach (Addendum)
[facility]) Care Management  09/05/2017  [name] [birth year] [id number]   RN Health Coach attempted #2 follow up outreach call to patient. Per mobile number. Person answered and stated this number must have belonged to [name] but it has been reassigned. RN telephone call to home num,ber. Patient was unavailable. Per home voice mailbox stated that it had not been set up at this time.   Plan: RN will send unsuccessful outreach letter.  RN will call patient again within 3-5 business days.  [place] Care Management [phone]

## 2017-09-06 ENCOUNTER — Encounter: Payer: Self-pay | Admitting: Family Medicine

## 2017-09-06 ENCOUNTER — Ambulatory Visit (INDEPENDENT_AMBULATORY_CARE_PROVIDER_SITE_OTHER): Payer: PPO | Admitting: Family Medicine

## 2017-09-06 ENCOUNTER — Other Ambulatory Visit: Payer: Self-pay | Admitting: Cardiovascular Disease

## 2017-09-06 VITALS — BP 108/60 | HR 57 | Temp 97.5°F | Ht 69.0 in | Wt 174.0 lb

## 2017-09-06 DIAGNOSIS — N2889 Other specified disorders of kidney and ureter: Secondary | ICD-10-CM | POA: Diagnosis not present

## 2017-09-06 DIAGNOSIS — N179 Acute kidney failure, unspecified: Secondary | ICD-10-CM

## 2017-09-06 DIAGNOSIS — S81801A Unspecified open wound, right lower leg, initial encounter: Secondary | ICD-10-CM | POA: Diagnosis not present

## 2017-09-06 DIAGNOSIS — D649 Anemia, unspecified: Secondary | ICD-10-CM

## 2017-09-06 DIAGNOSIS — E119 Type 2 diabetes mellitus without complications: Secondary | ICD-10-CM | POA: Diagnosis not present

## 2017-09-06 DIAGNOSIS — I5022 Chronic systolic (congestive) heart failure: Secondary | ICD-10-CM

## 2017-09-06 LAB — CBC
HEMATOCRIT: 30.4 % — AB (ref 39.0–52.0)
HEMOGLOBIN: 10.2 g/dL — AB (ref 13.0–17.0)
MCHC: 33.7 g/dL (ref 30.0–36.0)
MCV: 94.1 fl (ref 78.0–100.0)
PLATELETS: 139 10*3/uL — AB (ref 150.0–400.0)
RBC: 3.23 Mil/uL — ABNORMAL LOW (ref 4.22–5.81)
RDW: 17 % — ABNORMAL HIGH (ref 11.5–15.5)
WBC: 2.5 10*3/uL — AB (ref 4.0–10.5)

## 2017-09-06 LAB — BASIC METABOLIC PANEL
BUN: 20 mg/dL (ref 6–23)
CHLORIDE: 108 meq/L (ref 96–112)
CO2: 26 mEq/L (ref 19–32)
Calcium: 9.1 mg/dL (ref 8.4–10.5)
Creatinine, Ser: 1.54 mg/dL — ABNORMAL HIGH (ref 0.40–1.50)
GFR: 57.6 mL/min — ABNORMAL LOW (ref >60.00)
Glucose, Bld: 108 mg/dL — ABNORMAL HIGH (ref 70–99)
POTASSIUM: 4.1 meq/L (ref 3.5–5.1)
SODIUM: 140 meq/L (ref 135–145)

## 2017-09-06 NOTE — Patient Instructions (Addendum)
Nice to see you. I am glad you are improved. Were going to check some lab work today and contact you with the results.  We will then determine what to do for your diabetes. Please monitor the cut on your leg and if you develop redness, warmth, swelling, drainage, or any new or changing symptoms please seek medical attention immediately.

## 2017-09-07 LAB — IRON,TIBC AND FERRITIN PANEL
%SAT: 32 % (ref 20–48)
Ferritin: 273 ng/mL (ref 24–380)
Iron: 70 ug/dL (ref 50–180)
TIBC: 219 mcg/dL (calc) — ABNORMAL LOW (ref 250–425)

## 2017-09-10 ENCOUNTER — Telehealth: Payer: Self-pay

## 2017-09-10 ENCOUNTER — Other Ambulatory Visit: Payer: Self-pay | Admitting: *Deleted

## 2017-09-10 DIAGNOSIS — D649 Anemia, unspecified: Secondary | ICD-10-CM

## 2017-09-10 DIAGNOSIS — S81801A Unspecified open wound, right lower leg, initial encounter: Secondary | ICD-10-CM | POA: Insufficient documentation

## 2017-09-10 DIAGNOSIS — E119 Type 2 diabetes mellitus without complications: Secondary | ICD-10-CM | POA: Insufficient documentation

## 2017-09-10 DIAGNOSIS — N2889 Other specified disorders of kidney and ureter: Secondary | ICD-10-CM | POA: Insufficient documentation

## 2017-09-10 NOTE — Progress Notes (Signed)
Tommi Rumps, MD Phone: [phone]  Christopher Uhlig Tiny Rietz. is a 72 y.o. male who presents today for f/u. Patient previously lost to follow-up for several years.   CC: hospital follow-up, ARF, renal mass, DM, anemia,   Patient was hospitalized from 08/14/2017-08/17/2017 for generalized weakness and abdominal cramping.  Notes he developed abdominal cramping 2 to 3 days prior and it progressively worsened with some weakness.  In the ED he was found to be in acute renal failure and also noted to have a urinary tract infection based on urinalysis.  Acute renal failure was felt to be secondary to underlying UTI, pyelonephritis, and dehydration.  His kidney function improved with IV fluids.  They held his lisinopril and Aldactone.  There was concern for left-sided renal mass on ultrasound and abdominal MRI was completed revealing suspicious findings for pyelonephritis and possibly pyelonephrosis.  Urology and nephrology were involved.  The patient was treated with IV ceftriaxone and discharged with Keflex.  He was found to be anemic of unclear etiology.  No active bleeding found.  Also found to be diabetic with an A1c of 7.8.  Lantus was added during his hospitalization though he was not discharged on any medication.  Patient reports overall he feels well at this time.  He is urinating well with it being clear.  He stopped drinking soda.  He has seen urology and cardiology in follow-up.  He notes no bleeding.  Notes his bowel movements are brown.  Notes some increased thirst though no polyuria.  He does report he bumped his right leg on the lateral aspect after discharge and has an abrasion.  He has been using hydrogen peroxide and alcohol on it.  He did not fall and hit his head or lose consciousness.  He has no pain in this area.  Social History   Tobacco Use  Smoking Status Never Smoker  Smokeless Tobacco Never Used     ROS see history of present illness  Objective  Physical Exam Vitals:   09/06/17 1127  BP: 108/60  Pulse: (!) 57  Temp: (!) 97.5 F (36.4 C)  SpO2: 99%    BP Readings from Last 3 Encounters:  09/06/17 108/60  08/30/17 136/60  08/21/17 (!) 144/77   Wt Readings from Last 3 Encounters:  09/06/17 174 lb (78.9 kg)  08/30/17 176 lb (79.8 kg)  08/21/17 181 lb (82.1 kg)    Physical Exam  Constitutional: No distress.  Cardiovascular: Normal rate, regular rhythm and normal heart sounds.  Pulmonary/Chest: Effort normal and breath sounds normal.  Abdominal: Soft. Bowel sounds are normal. He exhibits no distension. There is no tenderness. There is no rebound and no guarding.  Musculoskeletal: He exhibits no edema.  Neurological: He is alert.  Skin: Skin is warm and dry. He is not diaphoretic.  Well-healing abrasion with scab formation on the lateral aspect of his right lower leg, no tenderness, no surrounding erythema, no drainage     Assessment/Plan: Please see individual problem list.  Acute renal failure (ARF) (HCC) Suspect related to dehydration in combination with his urologic issues.  Was improving at time of discharge.  We will recheck renal function.  Anemia Undetermined cause.  Ferritin was very elevated likely is an acute phase reactant during his hospitalization.  We will recheck that and his anemia.  Determine evaluation depending on recheck of labs.  Renal mass He will complete work-up through urology.  He has been following with them.  Diabetes ([place]) Diagnosed with diabetes in the hospital.  Prior renal function precluded the use of metformin.  We will recheck renal function and determine next step in management.  Leg wound, right, initial encounter Appears to be well-healing.  Monitor for signs of infection.  Advised against hydrogen peroxide use.  He can cleanse with soap and water.  Renal function returned improved.  We will plan on rechecking with CBC per result note and if it remains well controlled consider metformin for  diabetes.  Orders Placed This Encounter  Procedures  . Basic Metabolic Panel (BMET)  . CBC  . Iron, TIBC and Ferritin Panel  . Basic Metabolic Panel (BMET)    Standing Status:   Future    Standing Expiration Date:   09/11/2018    No orders of the defined types were placed in this encounter.    Tommi Rumps, MD [place]

## 2017-09-10 NOTE — Assessment & Plan Note (Signed)
Diagnosed with diabetes in the hospital.  Prior renal function precluded the use of metformin.  We will recheck renal function and determine next step in management.

## 2017-09-10 NOTE — Assessment & Plan Note (Signed)
He will complete work-up through urology.  He has been following with them.

## 2017-09-10 NOTE — Telephone Encounter (Signed)
-----   Message from Leone Haven, MD sent at 09/10/2017 12:27 PM EDT ----- Please let the patient know his iron studies have improved.  His kidney function is relatively stable from when he was discharged from the hospital.  Please find out if he follows with a kidney specialist.  His anemia is slightly improved though his platelets are mildly low and his white blood cell count is mildly low.  It appears that these things have been low in the past.  I would like to recheck this sometime later this week.  Please place an order for a CBC with differential for anemia.  He will likely need to follow-up with hematology to consider evaluation for this.  Thanks.

## 2017-09-10 NOTE — Assessment & Plan Note (Signed)
Undetermined cause.  Ferritin was very elevated likely is an acute phase reactant during his hospitalization.  We will recheck that and his anemia.  Determine evaluation depending on recheck of labs.

## 2017-09-10 NOTE — Patient Outreach (Signed)
[place] [facility]) Care Management  09/10/2017  [name] 10/01/1945 [id number]   RN Health Coach attempted follow up outreach call to patient.  Patient was unavailable. HIPPA compliance voicemail message left with return callback number.  Plan: RN will call patient again within 10 business days.  [place] Care Management [phone]

## 2017-09-10 NOTE — Assessment & Plan Note (Signed)
Appears to be well-healing.  Monitor for signs of infection.  Advised against hydrogen peroxide use.  He can cleanse with soap and water.

## 2017-09-10 NOTE — Assessment & Plan Note (Signed)
Suspect related to dehydration in combination with his urologic issues.  Was improving at time of discharge.  We will recheck renal function.

## 2017-09-12 ENCOUNTER — Telehealth: Payer: Self-pay

## 2017-09-12 NOTE — Telephone Encounter (Signed)
-----   Message from Leone Haven, MD sent at 09/10/2017 12:27 PM EDT ----- Please let the patient know his iron studies have improved.  His kidney function is relatively stable from when he was discharged from the hospital.  Please find out if he follows with a kidney specialist.  His anemia is slightly improved though his platelets are mildly low and his white blood cell count is mildly low.  It appears that these things have been low in the past.  I would like to recheck this sometime later this week.  Please place an order for a CBC with differential for anemia.  He will likely need to follow-up with hematology to consider evaluation for this.  Thanks.

## 2017-09-13 ENCOUNTER — Other Ambulatory Visit: Payer: Self-pay

## 2017-09-13 ENCOUNTER — Ambulatory Visit
Admission: RE | Admit: 2017-09-13 | Discharge: 2017-09-13 | Disposition: A | Discharge status: Home / Self Care | Payer: PPO | Source: Ambulatory Visit | Attending: Urology | Admitting: Urology

## 2017-09-13 ENCOUNTER — Ambulatory Visit (INDEPENDENT_AMBULATORY_CARE_PROVIDER_SITE_OTHER): Payer: PPO

## 2017-09-13 DIAGNOSIS — I700 Atherosclerosis of aorta: Secondary | ICD-10-CM | POA: Diagnosis not present

## 2017-09-13 DIAGNOSIS — I2510 Atherosclerotic heart disease of native coronary artery without angina pectoris: Secondary | ICD-10-CM | POA: Diagnosis not present

## 2017-09-13 DIAGNOSIS — N503 Cyst of epididymis: Secondary | ICD-10-CM | POA: Insufficient documentation

## 2017-09-13 DIAGNOSIS — R310 Gross hematuria: Secondary | ICD-10-CM

## 2017-09-13 DIAGNOSIS — I517 Cardiomegaly: Secondary | ICD-10-CM | POA: Insufficient documentation

## 2017-09-13 DIAGNOSIS — K8020 Calculus of gallbladder without cholecystitis without obstruction: Secondary | ICD-10-CM | POA: Insufficient documentation

## 2017-09-13 DIAGNOSIS — N281 Cyst of kidney, acquired: Secondary | ICD-10-CM | POA: Diagnosis not present

## 2017-09-13 DIAGNOSIS — N433 Hydrocele, unspecified: Secondary | ICD-10-CM | POA: Insufficient documentation

## 2017-09-13 DIAGNOSIS — N390 Urinary tract infection, site not specified: Secondary | ICD-10-CM | POA: Diagnosis not present

## 2017-09-13 DIAGNOSIS — I5022 Chronic systolic (congestive) heart failure: Secondary | ICD-10-CM

## 2017-09-13 DIAGNOSIS — R319 Hematuria, unspecified: Secondary | ICD-10-CM | POA: Insufficient documentation

## 2017-09-13 HISTORY — DX: Type 2 diabetes mellitus without complications: E11.9

## 2017-09-13 MED ORDER — IOPAMIDOL (ISOVUE-300) INJECTION 61%
100.0000 mL | Freq: Once | INTRAVENOUS | Status: AC | PRN
  Administered 2017-09-13: 100 mL via INTRAVENOUS

## 2017-09-19 ENCOUNTER — Other Ambulatory Visit (INDEPENDENT_AMBULATORY_CARE_PROVIDER_SITE_OTHER): Payer: PPO

## 2017-09-19 DIAGNOSIS — N179 Acute kidney failure, unspecified: Secondary | ICD-10-CM | POA: Diagnosis not present

## 2017-09-19 DIAGNOSIS — D649 Anemia, unspecified: Secondary | ICD-10-CM | POA: Diagnosis not present

## 2017-09-19 LAB — CBC WITH DIFFERENTIAL/PLATELET
BASOS ABS: 0 10*3/uL (ref 0.0–0.1)
BASOS PCT: 0.8 % (ref 0.0–3.0)
EOS ABS: 0.1 10*3/uL (ref 0.0–0.7)
Eosinophils Relative: 1.7 % (ref 0.0–5.0)
HEMATOCRIT: 32 % — AB (ref 39.0–52.0)
Hemoglobin: 10.8 g/dL — ABNORMAL LOW (ref 13.0–17.0)
LYMPHS ABS: 0.7 10*3/uL (ref 0.7–4.0)
LYMPHS PCT: 20.7 % (ref 12.0–46.0)
MCHC: 33.7 g/dL (ref 30.0–36.0)
MCV: 93.6 fl (ref 78.0–100.0)
Monocytes Absolute: 0.4 10*3/uL (ref 0.1–1.0)
Monocytes Relative: 11.2 % (ref 3.0–12.0)
NEUTROS ABS: 2.3 10*3/uL (ref 1.4–7.7)
NEUTROS PCT: 65.6 % (ref 43.0–77.0)
PLATELETS: 129 10*3/uL — AB (ref 150.0–400.0)
RBC: 3.42 Mil/uL — ABNORMAL LOW (ref 4.22–5.81)
RDW: 17.4 % — AB (ref 11.5–15.5)
WBC: 3.6 10*3/uL — ABNORMAL LOW (ref 4.0–10.5)

## 2017-09-19 LAB — BASIC METABOLIC PANEL
BUN: 11 mg/dL (ref 6–23)
CALCIUM: 8.9 mg/dL (ref 8.4–10.5)
CHLORIDE: 108 meq/L (ref 96–112)
CO2: 29 meq/L (ref 19–32)
CREATININE: 1.25 mg/dL (ref 0.40–1.50)
GFR: 73.28 mL/min (ref >60.00)
GLUCOSE: 134 mg/dL — AB (ref 70–99)
Potassium: 3.8 mEq/L (ref 3.5–5.1)
Sodium: 142 mEq/L (ref 135–145)

## 2017-09-20 ENCOUNTER — Other Ambulatory Visit: Payer: Self-pay | Admitting: Family Medicine

## 2017-09-20 DIAGNOSIS — D61818 Other pancytopenia: Secondary | ICD-10-CM

## 2017-09-27 ENCOUNTER — Encounter: Payer: Self-pay | Admitting: Internal Medicine

## 2017-09-27 ENCOUNTER — Inpatient Hospital Stay: Payer: PPO

## 2017-09-27 ENCOUNTER — Other Ambulatory Visit: Payer: Self-pay

## 2017-09-27 ENCOUNTER — Inpatient Hospital Stay: Payer: PPO | Attending: Internal Medicine | Admitting: Hematology and Oncology

## 2017-09-27 VITALS — BP 161/76 | HR 58 | Temp 97.8°F | Resp 20 | Ht 69.0 in | Wt 174.0 lb

## 2017-09-27 DIAGNOSIS — D649 Anemia, unspecified: Secondary | ICD-10-CM | POA: Diagnosis not present

## 2017-09-27 DIAGNOSIS — C187 Malignant neoplasm of sigmoid colon: Secondary | ICD-10-CM

## 2017-09-27 DIAGNOSIS — D61818 Other pancytopenia: Secondary | ICD-10-CM

## 2017-09-27 DIAGNOSIS — C189 Malignant neoplasm of colon, unspecified: Secondary | ICD-10-CM

## 2017-09-27 DIAGNOSIS — D72819 Decreased white blood cell count, unspecified: Secondary | ICD-10-CM

## 2017-09-27 DIAGNOSIS — R319 Hematuria, unspecified: Secondary | ICD-10-CM | POA: Diagnosis not present

## 2017-09-27 DIAGNOSIS — D696 Thrombocytopenia, unspecified: Secondary | ICD-10-CM | POA: Diagnosis not present

## 2017-09-27 DIAGNOSIS — Z79899 Other long term (current) drug therapy: Secondary | ICD-10-CM | POA: Diagnosis not present

## 2017-09-27 LAB — CBC WITH DIFFERENTIAL/PLATELET
Basophils Absolute: 0 10*3/uL (ref 0–0.1)
Basophils Relative: 1 %
Eosinophils Absolute: 0.1 10*3/uL (ref 0–0.7)
Eosinophils Relative: 2 %
HCT: 31.2 % — ABNORMAL LOW (ref 40.0–52.0)
Hemoglobin: 10.6 g/dL — ABNORMAL LOW (ref 13.0–18.0)
Lymphocytes Relative: 24 %
Lymphs Abs: 0.6 10*3/uL — ABNORMAL LOW (ref 1.0–3.6)
MCH: 31.9 pg (ref 26.0–34.0)
MCHC: 34 g/dL (ref 32.0–36.0)
MCV: 94 fL (ref 80.0–100.0)
Monocytes Absolute: 0.4 10*3/uL (ref 0.2–1.0)
Monocytes Relative: 14 %
Neutro Abs: 1.6 10*3/uL (ref 1.4–6.5)
Neutrophils Relative %: 59 %
Platelets: 141 10*3/uL — ABNORMAL LOW (ref 150–440)
RBC: 3.32 MIL/uL — ABNORMAL LOW (ref 4.40–5.90)
RDW: 17.1 % — ABNORMAL HIGH (ref 11.5–14.5)
WBC: 2.7 10*3/uL — ABNORMAL LOW (ref 3.8–10.6)

## 2017-09-27 LAB — RETICULOCYTES
RBC.: 3.35 MIL/uL — ABNORMAL LOW (ref 4.40–5.90)
Retic Count, Absolute: 60.3 10*3/uL (ref 19.0–183.0)
Retic Ct Pct: 1.8 % (ref 0.4–3.1)

## 2017-09-27 LAB — COMPREHENSIVE METABOLIC PANEL
ALT: 15 U/L (ref 0–44)
AST: 20 U/L (ref 15–41)
Albumin: 3.6 g/dL (ref 3.5–5.0)
Alkaline Phosphatase: 78 U/L (ref 38–126)
Anion gap: 4 — ABNORMAL LOW (ref 5–15)
BUN: 13 mg/dL (ref 8–23)
CO2: 28 mmol/L (ref 22–32)
Calcium: 8.6 mg/dL — ABNORMAL LOW (ref 8.9–10.3)
Chloride: 110 mmol/L (ref 98–111)
Creatinine, Ser: 1.32 mg/dL — ABNORMAL HIGH (ref 0.61–1.24)
GFR calc Af Amer: >60 mL/min (ref >60)
GFR calc non Af Amer: 53 mL/min — ABNORMAL LOW (ref >60)
Glucose, Bld: 111 mg/dL — ABNORMAL HIGH (ref 70–99)
Potassium: 3.7 mmol/L (ref 3.5–5.1)
Sodium: 142 mmol/L (ref 135–145)
Total Bilirubin: 1 mg/dL (ref 0.3–1.2)
Total Protein: 6.9 g/dL (ref 6.5–8.1)

## 2017-09-27 LAB — TSH: TSH: 1.19 u[IU]/mL (ref 0.350–4.500)

## 2017-09-27 LAB — IRON AND TIBC
Iron: 50 ug/dL (ref 45–182)
Saturation Ratios: 25 % (ref 17.9–39.5)
TIBC: 201 ug/dL — ABNORMAL LOW (ref 250–450)
UIBC: 151 ug/dL

## 2017-09-27 LAB — VITAMIN B12: Vitamin B-12: 149 pg/mL — ABNORMAL LOW (ref 180–914)

## 2017-09-27 LAB — TECHNOLOGIST SMEAR REVIEW: Tech Review: DECREASED

## 2017-09-27 LAB — FOLATE: Folate: 12.3 ng/mL (ref >5.9)

## 2017-09-27 LAB — FERRITIN: Ferritin: 103 ng/mL (ref 24–336)

## 2017-09-27 NOTE — Progress Notes (Signed)
[facility] day:  09/27/17   Chief Complaint: Christopher Cozby. is a 72 y.o. male with a history of stage IIIC colon cancer who is referred by Dr Caryl Bis for evaluation of pancytopenia.  HPI: The patient was last seen in the medical oncology clinic on 08/24/2016.  At that time,  he felt good.  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 He was on Neurontin.  Exam was stable.  Creatinine was 1.61 (baseline Cr 1.25 - 1.38).  CEA was 1.6.  CBC revealed a hematocrit of 35.2, hemoglobin 12.4, platelets 140,000, WBC 3300 with an ANC of 2000.  He was to follow-up in 6 months with imaging. Colonoscopy was scheduled.  He was lost to follow-up.  Colonoscopy on 12/11/2016 by Dr. Gustavo Lah revealed 10 polyps (2-8 mm) in the ascending proximal transverse, descending, proximal sigmoid and rectum.  Pathology revealed multiple tubular adenomas without dysplasia or malignancy.  He states that during the interim, he was diagnosed with diabetes.    He was admitted to [facility] from 08/14/2017 - 08/17/2017 with acute renal failure secondary to a UTI, pyelonephritis, and dehydration.  He received IVF.  Lisinopril and aldactone were held.  He was treated with ceftriaxone then changed to Keflex.  Abdomen and pelvic CT without contrast on 08/15/2017 revealed mild left hydronephroureter and bilateral renal hypodense lesions.  Abdomen MRI on 08/16/2017 revealed abnormal appearance of the left kidney with striated appearance suggesting edema/infiltration, mild asymmetric left perirenal stranding, and heterogeneous debris in the left collecting system suspicious for pyelonephritis.    He has undergone a work-up for hematuria. He has been followed by [facility].  CT hematuria work-up (abdomen and pelvic CT with and without contrast) on 09/13/2017 revealed no definite source for hematuria.  There were multiple simple cysts.  Cystoscopy is  planned.  He received a course of Septra from 08/26/2017 - 09/02/2017.  CBC has been followed:   08/15/2017:  Hematocrit 28.2, hemoglobin 9.5, platelets 141,000, WBC 8400. 09/06/2017:  Hematocrit 30.4, hemoglobin 10.2, MCV 94.1, platelets 139,000, WBC 2500. 09/19/2017:  Hematocrit 32.0, hemoglobin 10.8, MCV 93.6, platelets 129,000, WBC 3600 with an ANC of 1700.  Diet is modest.  He eats meat 2 x/week.  He does not eat much green leafy vegetables.  He dinks a lot of water.  He denies any pica or restless legs.  He denies any herbal products or new medications.    Symptomatically, he denies any fever.  He denies any bleeding (hematuria, melena or hematochezia).   Past Medical History:  Diagnosis Date  . Anemia   . CAD (coronary artery disease)    a. 02/2015 Cath: LM nl, LAD 50/40 mid/distal, LCX 80p, RCA 40p, 30d, RPL1 40, CO 3.27, CI 1.65; b. cath 06/09/15: LM minor irregs, m-dLAD 50%, dLAD 40%, pLCx 80% s/p PCI/DES 0%, pRCA 40%, dRCA 30%, 1st RPLB 40%  . Chronic systolic CHF (congestive heart failure) ([place])    a. 02/2015 Echo: EF 20-25%, diff HK, mod MR, mildy dil LA, mildly dil RV w/ mod RV syst dysfxn, mildly dil RA, mod TR, PASP 82mHg.  . [place]    a. 2015 s/p colectomy followed by chemoRx with oxaliplatin & fluorouacil.  . Diabetes [place] complication [place]   . Hypertension   . Hypertensive heart disease   . Mixed Ischemic and Non-ischemic Cardiomyopathy    a. 02/2015 Echo: EF 20-25%, diff HK.  . Moderate mitral regurgitation   .  Moderate tricuspid regurgitation   . Tubular adenoma of colon     Past Surgical History:  Procedure Laterality Date  . CARDIAC CATHETERIZATION Bilateral 02/24/2015   Procedure: Right/Left Heart Cath and Coronary Angiography;  Surgeon: Wellington Hampshire, MD;  Location: [place];  Service: Cardiovascular;  Laterality: Bilateral;  . CARDIAC CATHETERIZATION N/A 06/09/2015   Procedure: Coronary Stent Intervention;  Surgeon:  Wellington Hampshire, MD;  Location: [facility];  Service: Cardiovascular;  Laterality: N/A;  . COLON SURGERY     Colectomy  . COLONOSCOPY    . COLONOSCOPY WITH PROPOFOL N/A 12/11/2016   Procedure: COLONOSCOPY WITH PROPOFOL;  Surgeon: Lollie Sails, MD;  Location: [facility] ENDOSCOPY;  Service: Endoscopy;  Laterality: N/A;  . ESOPHAGOGASTRODUODENOSCOPY    . PORT-A-CATH REMOVAL N/A 07/12/2014   Procedure: REMOVAL PORT-A-CATH;  Surgeon: Molly Maduro, MD;  Location: [facility] ORS;  Service: General;  Laterality: N/A;  . PORTACATH PLACEMENT Left     History reviewed. No pertinent family history.  Social History:  reports that he has never smoked. He has never used smokeless tobacco. He reports that he does not drink alcohol or use drugs.  He has retired from the city.  The patient lives in [place].  He is accompanied by his wife, Romie Minus, today.  Allergies:  Allergies  Allergen Reactions  . Shellfish Allergy Other (See Comments)    Pt. instructed by MD to avoid seafood    Current Medications: Current Outpatient Medications  Medication Sig Dispense Refill  . aspirin EC 81 MG tablet Take 1 tablet (81 mg total) by mouth daily. 90 tablet 3  . atorvastatin (LIPITOR) 40 MG tablet Take 1 tablet (40 mg total) by mouth daily. 90 tablet 1  . carvedilol (COREG) 3.125 MG tablet take 1 tablet by mouth twice a day 180 tablet 2  . gabapentin (NEURONTIN) 100 MG capsule Take 2 capsules (200 mg total) by mouth 3 (three) times daily. (Patient taking differently: Take 100 mg by mouth 3 (three) times daily. ) 180 capsule 0  . losartan (COZAAR) 25 MG tablet Take 1 tablet (25 mg total) by mouth daily. 90 tablet 1  . tamsulosin (FLOMAX) 0.4 MG CAPS capsule Take 1 capsule (0.4 mg total) by mouth daily after supper. 30 capsule 11   No current facility-administered medications for this visit.     Review of Systems:  GENERAL:  Feels fine.  No fevers, sweats or weight loss. PERFORMANCE STATUS (ECOG):   0 HEENT:  No visual changes, runny nose, sore throat, mouth sores or tenderness. Lungs: No shortness of breath or cough.  No hemoptysis. Cardiac:  No chest pain, palpitations, orthopnea, or PND. GI:  No nausea, vomiting, diarrhea, constipation, melena or hematochezia. GU:  Interval hematuria.  Interval UTI (pyelonephritis).  No urgency, frequency, dysuria, or hematuria. Musculoskeletal:  No back pain.  No joint pain.  No muscle tenderness. Extremities:  No pain or swelling. Skin:  No rashes or skin changes. Neuro:  No headache, numbness or weakness, balance or coordination issues. Endocrine:  No diabetes, thyroid issues, hot flashes or night sweats. Psych:  No mood changes, depression or anxiety. Pain:  No focal pain. Review of systems:  All other systems reviewed and found to be negative.  Physical Exam: Blood pressure (!) 161/76, pulse (!) 58, temperature 97.8 F (36.6 C), resp. rate 20. GENERAL:  Well developed, well nourished,gentleman sitting comfortably in the exam room in no acute distress. MENTAL STATUS:  Alert and oriented to person, place  and time. HEAD:  Alopecia.  Normocephalic, atraumatic, face symmetric, no Cushingoid features. EYES:  Brown eyes.  Pupils equal round and reactive to light and accomodation.  No conjunctivitis or scleral icterus. ENT:  Oropharynx clear without lesion.  Tongue normal. Mucous membranes moist.  RESPIRATORY:  Clear to auscultation without rales, wheezes or rhonchi. CARDIOVASCULAR:  Regular rate and rhythm without murmur, rub or gallop. ABDOMEN:  Soft, non-tender, with active bowel sounds, and no hepatosplenomegaly.  No masses. SKIN:  No rashes, ulcers or lesions. EXTREMITIES: No edema, no skin discoloration or tenderness.  No palpable cords. LYMPH NODES: No palpable cervical, supraclavicular, axillary or inguinal adenopathy  NEUROLOGICAL: Unremarkable. PSYCH:  Appropriate.   Imaging studies: 01/11/2014:  Chest, abdomen, and pelvic CT revealed  no evidence of metastatic disease. He was noted to have stable splenomegaly. There was some progression of tree-in-bud changes in the lungs, questionable MAC infection. There were chronic inflammatory changes. 01/12/2015:  Chest, abdomen, and pelvic CT revealed no evidence of metastatic disease in the chest, abdomen or pelvis.  There was a solitary 4 mm right upper lobe pulmonary nodule (stable). There was mild cardiomegaly (increased).  There was mild patchy ground-glass opacity and interlobular septal thickening throughout both lungs, suggestive of pulmonary edema, concerning for developing congestive heart failure.  There was stable dilated main pulmonary artery, suggesting pulmonary arterial hypertension.  There was left main and 3 vessel coronary atherosclerosis.   01/12/2016:  Chest, abdomen, and pelvic CT revealed no evidence of recurrent or metastatic carcinoma.  There was cholelithiasis without radiographic evidence of cholecystitis.  There was aortic and coronary artery atherosclerosis. 08/15/2017:  Abdomen and pelvic CT without contrast revealed mild left hydronephroureter and bilateral renal hypodense lesions.   08/16/2017:  Abdomen MRI revealed abnormal appearance of the left kidney with striated appearance suggesting edema/infiltration, mild asymmetric left perirenal stranding, and heterogeneous debris in the left collecting system suspicious for pyelonephritis.   09/13/2017:  Abdomen and pelvic CT with and without contrast revealed no definite source for hematuria.  There were multiple simple cysts. No liver lesions.   No visits with results within 3 Day(s) from this visit.  Latest known visit with results is:  Lab on 09/19/2017  Component Date Value Ref Range Status  . Sodium 09/19/2017 142  135 - 145 mEq/L Final  . Potassium 09/19/2017 3.8  3.5 - 5.1 mEq/L Final  . Chloride 09/19/2017 108  96 - 112 mEq/L Final  . CO2 09/19/2017 29  19 - 32 mEq/L Final  . Glucose, Bld 09/19/2017 134*  70 - 99 mg/dL Final  . BUN 09/19/2017 11  6 - 23 mg/dL Final  . Creatinine, Ser 09/19/2017 1.25  0.40 - 1.50 mg/dL Final  . Calcium 09/19/2017 8.9  8.4 - 10.5 mg/dL Final  . GFR 09/19/2017 73.28  >60.00 mL/min Final  . WBC 09/19/2017 3.6* 4.0 - 10.5 K/uL Final  . RBC 09/19/2017 3.42* 4.22 - 5.81 Mil/uL Final  . Hemoglobin 09/19/2017 10.8* 13.0 - 17.0 g/dL Final  . HCT 09/19/2017 32.0* 39.0 - 52.0 % Final  . MCV 09/19/2017 93.6  78.0 - 100.0 fl Final  . MCHC 09/19/2017 33.7  30.0 - 36.0 g/dL Final  . RDW 09/19/2017 17.4* 11.5 - 15.5 % Final  . Platelets 09/19/2017 129.0* 150.0 - 400.0 K/uL Final  . Neutrophils Relative % 09/19/2017 65.6  43.0 - 77.0 % Final  . Lymphocytes Relative 09/19/2017 20.7  12.0 - 46.0 % Final  . Monocytes Relative 09/19/2017 11.2  3.0 - 12.0 %  Final  . Eosinophils Relative 09/19/2017 1.7  0.0 - 5.0 % Final  . Basophils Relative 09/19/2017 0.8  0.0 - 3.0 % Final  . Neutro Abs 09/19/2017 2.3  1.4 - 7.7 K/uL Final  . Lymphs Abs 09/19/2017 0.7  0.7 - 4.0 K/uL Final  . Monocytes Absolute 09/19/2017 0.4  0.1 - 1.0 K/uL Final  . Eosinophils Absolute 09/19/2017 0.1  0.0 - 0.7 K/uL Final  . Basophils Absolute 09/19/2017 0.0  0.0 - 0.1 K/uL Final    Assessment:  Christopher Jellison. is a 72 y.o. male with stage IIIC colon cancer. He presented with symptomatic anemia.  He underwent descending and proximal sigmoid colectomy, left ureteral lysis, and partial cecectomy on 06/23/2013. Six of 22 lymph nodes were positive.  There were tumor deposits (discontinuous extramural extension).  There was lymphovascular and perineural invasion. Pathologic stage was IIIC (T4bN2a M0).  He received FOLFOX chemotherapy from 07/22/2013 until 01/06/2014.    Chest, abdomen, and pelvic CT on 01/12/2016 revealed no evidence of recurrent or metastatic carcinoma.  Abdomen and pelvic CT with and without contrast on 09/13/2017 revealed no multiple simple renal cysts.  Liver was unremarkable.  CEA has  been followed:  4.3 on 06/20/2013, 1.8 on 01/06/2014, 1.5 on 09/16/2014, 1.1 on 01/14/2015, 1.5 on 05/16/2015, 1.6 on 09/12/2015, 1.3 on 01/20/2016, 1.6 on 08/24/2016, 1.2 on 01/09/2017, and 1.1 on 09/27/2017.  Colonoscopy on 04/12/2014 revealed several polyps.  There was no dysplasia or malignancy.  Colonoscopy on 12/11/2016 revealed 10 polyps (2-8 mm) in the ascending proximal transverse, descending, proximal sigmoid and rectum.  Pathology revealed multiple tubular adenomas without dysplasia or malignancy.  He was diagnosed with pyelonephritis.  He is undergoing evaluation for hematuria.  CT scan was negative.  Cystoscopy is planned.  He has a mild pancytopenia.  He received a course of Septra (08/26/2017 - 09/02/2017).  He has a history of a mild normocytic anemia.  Diet is modest.  He denies herbal products.  He denies any melena or hematochezia.  Echocardiogram revealed an EF was 20-25% on 02/18/2015.  Cardiac catheterization on 02/24/2015 revealed 80% occlusion of the proximal left circumflex.  He managed medically.  Symptomatically, he denies any complaints.  Exam is unremarkable.  Plan: 1.  Pancytopenia:  Patient has a mild normocytic anemia, mild thrombocytopenia, and mild leukopenia.  Anemia has improved since hospitalization for renal failure and pyelonephritis  Patient received Septra, marrow suppressant, in late 08/2017.  Counts appear to be improving.  Diet is modest.  Discuss laboratory work-up. 2.  Stage III C colon cancer:  Review interval abdomen and pelvic CT scan.  Images personally reviewed.  No evidence of malignancy.  Discuss interval colonoscopy.  Multiple polyps (tubular adenomas).  Follow-up with Dr. Gustavo Lah.  Check CEA today. 3.  Hematuria:  Work-up with urology underway.    Abdomen and pelvic CT negative.  Cystoscopy planned. 4:  Labs today:  CBC with diff, CMP, B12, folate, TSH, ferritin, iron studies, retic, CEA, hepatitis B core antibody total, hepatitis C  antibody. 5.  Peripheral smear for review. 6.  RTC in 1 week for MD assessment, review of labs and discussion regarding direction of therapy.    Lequita Asal, MD  09/27/17, 12:14 PM

## 2017-09-27 NOTE — Progress Notes (Signed)
Patient being referred back to cancer center by PCP- Dr. Biagio Quint for pancytopenia. Patient has h/o colon cancer. He has not had any f/u with Dr. Zenia Resides since last year. Per patient, he udnerwent a colonoscopy with Dr. Donnella Sham within the last month for colon cancer f/u. Also h/o of polyps. Patient denies any blood in his stool or urine. Patient recently treated for a kidney infection with Bactrim. He has finished his course of antibiotics. He is recently dx with diabetes.

## 2017-09-28 ENCOUNTER — Encounter: Payer: Self-pay | Admitting: Hematology and Oncology

## 2017-09-28 ENCOUNTER — Other Ambulatory Visit: Payer: Self-pay | Admitting: Hematology and Oncology

## 2017-09-28 DIAGNOSIS — E538 Deficiency of other specified B group vitamins: Secondary | ICD-10-CM | POA: Insufficient documentation

## 2017-09-28 DIAGNOSIS — D72819 Decreased white blood cell count, unspecified: Secondary | ICD-10-CM | POA: Insufficient documentation

## 2017-09-28 DIAGNOSIS — D696 Thrombocytopenia, unspecified: Secondary | ICD-10-CM | POA: Insufficient documentation

## 2017-09-28 LAB — HEPATITIS C ANTIBODY: HCV Ab: 0.1 s/co ratio (ref 0.0–0.9)

## 2017-09-28 LAB — HEPATITIS B CORE ANTIBODY, TOTAL: Hep B Core Total Ab: NEGATIVE

## 2017-09-28 LAB — CEA: CEA: 1.1 ng/mL (ref 0.0–4.7)

## 2017-10-03 ENCOUNTER — Encounter: Payer: Self-pay | Admitting: Urology

## 2017-10-03 ENCOUNTER — Ambulatory Visit: Payer: PPO | Admitting: Urology

## 2017-10-03 VITALS — BP 160/70 | HR 71 | Ht 69.0 in | Wt 182.2 lb

## 2017-10-03 DIAGNOSIS — R310 Gross hematuria: Secondary | ICD-10-CM | POA: Diagnosis not present

## 2017-10-03 MED ORDER — LIDOCAINE HCL URETHRAL/MUCOSAL 2 % EX GEL
1.0000 "application " | Freq: Once | CUTANEOUS | Status: AC
  Administered 2017-10-03: 1 via URETHRAL

## 2017-10-03 MED ORDER — CIPROFLOXACIN HCL 500 MG PO TABS
500.0000 mg | ORAL_TABLET | Freq: Once | ORAL | Status: AC
  Administered 2017-10-03: 500 mg via ORAL

## 2017-10-04 ENCOUNTER — Inpatient Hospital Stay (HOSPITAL_BASED_OUTPATIENT_CLINIC_OR_DEPARTMENT_OTHER): Payer: PPO | Admitting: Hematology and Oncology

## 2017-10-04 ENCOUNTER — Encounter: Payer: Self-pay | Admitting: Hematology and Oncology

## 2017-10-04 ENCOUNTER — Other Ambulatory Visit: Payer: Self-pay

## 2017-10-04 VITALS — BP 146/57 | HR 58 | Temp 98.2°F | Resp 18 | Wt 182.1 lb

## 2017-10-04 DIAGNOSIS — D649 Anemia, unspecified: Secondary | ICD-10-CM | POA: Diagnosis not present

## 2017-10-04 DIAGNOSIS — E538 Deficiency of other specified B group vitamins: Secondary | ICD-10-CM

## 2017-10-04 DIAGNOSIS — C189 Malignant neoplasm of colon, unspecified: Secondary | ICD-10-CM | POA: Diagnosis not present

## 2017-10-04 DIAGNOSIS — C187 Malignant neoplasm of sigmoid colon: Secondary | ICD-10-CM

## 2017-10-04 DIAGNOSIS — D61818 Other pancytopenia: Secondary | ICD-10-CM | POA: Diagnosis not present

## 2017-10-04 DIAGNOSIS — D696 Thrombocytopenia, unspecified: Secondary | ICD-10-CM | POA: Diagnosis not present

## 2017-10-04 DIAGNOSIS — D72819 Decreased white blood cell count, unspecified: Secondary | ICD-10-CM | POA: Diagnosis not present

## 2017-10-04 DIAGNOSIS — R319 Hematuria, unspecified: Secondary | ICD-10-CM | POA: Diagnosis not present

## 2017-10-04 LAB — MICROSCOPIC EXAMINATION
Bacteria, UA: NONE SEEN
RBC, UA: NONE SEEN /hpf (ref 0–2)

## 2017-10-04 LAB — URINALYSIS, COMPLETE
Bilirubin, UA: NEGATIVE
GLUCOSE, UA: NEGATIVE
Ketones, UA: NEGATIVE
LEUKOCYTES UA: NEGATIVE
Nitrite, UA: NEGATIVE
PH UA: 5.5 (ref 5.0–7.5)
RBC, UA: NEGATIVE
Specific Gravity, UA: 1.02 (ref 1.005–1.030)
Urobilinogen, Ur: 0.2 mg/dL (ref 0.2–1.0)

## 2017-10-04 NOTE — Progress Notes (Signed)
Patient here for follow up. No concerns voiced.  °

## 2017-10-04 NOTE — Progress Notes (Signed)
[facility] day:  10/04/17   Chief Complaint: [name]. is a 72 y.o. male with a history of stage IIIC colon cancer and pancytopenia who is seen for review of work-up and discussion regarding direction of therapy.  HPI: The patient was last seen in the medical oncology clinic on 09/27/2017.  At that time,  He denied any complaints.  He had mild pancytopenia.  He had received a course of Septra in the past month.  He had a history of a mild normocytic anemia.  Diet was modest.  He denied herbal products.  He denied any melena or hematochezia.  Work-up revealed the following:  hematocrit 31.1, hemoglobin 10.6, MCV 94, platelets 141,000, WBC 2700 with an ANC of 1600.  Creatinine was 1.32.  B12 was low (149).  Negative studies included:  Folate (12.3), TSH, hepatitis B core antibody total, and hepatitis C antibody.  Retic was 1.8%.  Ferritin was 103 with a iron saturation of 25% and a TIBC of 201.  CEA was 1.1.  Peripheral smear revealed normal RBC morphology and WBC morphology.  Platelets varied in size.  He was seen by Dr. Bernardo Heater on 10/03/2017.  Cystoscopy revealed prostate enlargement and no evidence of bladder pathology.  Symptomatically, he denies any complaints.  He denies any bleeding.   Past Medical History:  Diagnosis Date  . Anemia   . CAD (coronary artery disease)    a. 02/2015 Cath: LM nl, LAD 50/40 mid/distal, LCX 80p, RCA 40p, 30d, RPL1 40, CO 3.27, CI 1.65; b. cath 06/09/15: LM minor irregs, m-dLAD 50%, dLAD 40%, pLCx 80% s/p PCI/DES 0%, pRCA 40%, dRCA 30%, 1st RPLB 40%  . Chronic systolic CHF (congestive heart failure) ([place])    a. 02/2015 Echo: EF 20-25%, diff HK, mod MR, mildy dil LA, mildly dil RV w/ mod RV syst dysfxn, mildly dil RA, mod TR, PASP 67mHg.  . Colon cancer ([place]    a. 2015 s/p colectomy followed by chemoRx with oxaliplatin & fluorouacil.  . Diabetes mellitus without complication ([place]   . Hypertension   .  Hypertensive heart disease   . Mixed Ischemic and Non-ischemic Cardiomyopathy    a. 02/2015 Echo: EF 20-25%, diff HK.  . Moderate mitral regurgitation   . Moderate tricuspid regurgitation   . Tubular adenoma of colon     Past Surgical History:  Procedure Laterality Date  . CARDIAC CATHETERIZATION Bilateral 02/24/2015   Procedure: Right/Left Heart Cath and Coronary Angiography;  Surgeon: MWellington Hampshire MD;  [facility];  Service: Cardiovascular;  Laterality: Bilateral;  . CARDIAC CATHETERIZATION N/A 06/09/2015   Procedure: Coronary Stent Intervention;  Surgeon: MWellington Hampshire MD;  [facility];  Service: Cardiovascular;  Laterality: N/A;  . COLON SURGERY     Colectomy  . COLONOSCOPY    . COLONOSCOPY WITH PROPOFOL N/A 12/11/2016   Procedure: COLONOSCOPY WITH PROPOFOL;  Surgeon: SLollie Sails MD;  [facility];  Service: Endoscopy;  Laterality: N/A;  . ESOPHAGOGASTRODUODENOSCOPY    . PORT-A-CATH REMOVAL N/A 07/12/2014   Procedure: REMOVAL PORT-A-CATH;  Surgeon: WMolly Maduro MD;  Location: [facility];  Service: General;  Laterality: N/A;  . PORTACATH PLACEMENT Left     History reviewed. No pertinent family history.  Social History:  reports that he has never smoked. He has never used smokeless tobacco. He reports that he does not drink alcohol or use drugs.  He has [place] from the  city.  The patient lives in [place].  He is accompanied by his wife, Romie Minus, today.  Allergies:  Allergies  Allergen Reactions  . Shellfish Allergy Other (See Comments)    Pt. instructed by MD to avoid seafood    Current Medications: Current Outpatient Medications  Medication Sig Dispense Refill  . aspirin EC 81 MG tablet Take 1 tablet (81 mg total) by mouth daily. 90 tablet 3  . atorvastatin (LIPITOR) 40 MG tablet Take 1 tablet (40 mg total) by mouth daily. 90 tablet 1  . carvedilol (COREG) 3.125 MG tablet take 1 tablet by mouth twice a day 180  tablet 2  . gabapentin (NEURONTIN) 100 MG capsule Take 2 capsules (200 mg total) by mouth 3 (three) times daily. (Patient taking differently: Take 100 mg by mouth 3 (three) times daily. ) 180 capsule 0  . losartan (COZAAR) 25 MG tablet Take 1 tablet (25 mg total) by mouth daily. 90 tablet 1  . tamsulosin (FLOMAX) 0.4 MG CAPS capsule Take 1 capsule (0.4 mg total) by mouth daily after supper. 30 capsule 11   No current facility-administered medications for this visit.     Review of Systems:  GENERAL:  Feels good.  Active.  No fevers, sweats.  Weight stable. PERFORMANCE STATUS (ECOG):  0 HEENT:  No visual changes, runny nose, sore throat, mouth sores or tenderness. Lungs: No shortness of breath or cough.  No hemoptysis. Cardiac:  No chest pain, palpitations, orthopnea, or PND. GI:  No nausea, vomiting, diarrhea, constipation, melena or hematochezia. GU:  No urgency, frequency, dysuria, or hematuria.  Interval cystoscopy. Musculoskeletal:  No back pain.  No joint pain.  No muscle tenderness. Extremities:  No pain or swelling. Skin:  No rashes or skin changes. Neuro:  No headache, numbness or weakness, balance or coordination issues. Endocrine:  No diabetes, thyroid issues, hot flashes or night sweats. Psych:  No mood changes, depression or anxiety. Pain:  No focal pain. Review of systems:  All other systems reviewed and found to be negative.   Physical Exam: Blood pressure (!) 146/57, pulse (!) 58, temperature 98.2 F (36.8 C), temperature source Oral, resp. rate 18, weight 182 lb 1.6 oz (82.6 kg). GENERAL:  Well developed, well nourished, gentleman sitting comfortably in the exam room in no acute distress. MENTAL STATUS:  Alert and oriented to person, place and time. HEAD:  Alopecia.  Normocephalic, atraumatic, face symmetric, no Cushingoid features. EYES:  Brown eyes.  No conjunctivitis or scleral icterus. SKIN:  No rashes, ulcers or lesions. NEUROLOGICAL: Unremarkable. PSYCH:   Appropriate.    Imaging studies: 01/11/2014:  Chest, abdomen, and pelvic CT revealed no evidence of metastatic disease. He was noted to have stable splenomegaly. There was some progression of tree-in-bud changes in the lungs, questionable MAC infection. There were chronic inflammatory changes. 01/12/2015:  Chest, abdomen, and pelvic CT revealed no evidence of metastatic disease in the chest, abdomen or pelvis.  There was a solitary 4 mm right upper lobe pulmonary nodule (stable). There was mild cardiomegaly (increased).  There was mild patchy ground-glass opacity and interlobular septal thickening throughout both lungs, suggestive of pulmonary edema, concerning for developing congestive heart failure.  There was stable dilated main pulmonary artery, suggesting pulmonary arterial hypertension.  There was left main and 3 vessel coronary atherosclerosis.   01/12/2016:  Chest, abdomen, and pelvic CT revealed no evidence of recurrent or metastatic carcinoma.  There was cholelithiasis without radiographic evidence of cholecystitis.  There was aortic and coronary artery atherosclerosis. 08/15/2017:  Abdomen and pelvic CT without contrast revealed mild left hydronephroureter and bilateral renal hypodense lesions.   08/16/2017:  Abdomen MRI revealed abnormal appearance of the left kidney with striated appearance suggesting edema/infiltration, mild asymmetric left perirenal stranding, and heterogeneous debris in the left collecting system suspicious for pyelonephritis.   09/13/2017:  Abdomen and pelvic CT with and without contrast revealed no definite source for hematuria.  There were multiple simple cysts. No liver lesions.   Procedure visit on 10/03/2017  Component Date Value Ref Range Status  . Specific Gravity, UA 10/03/2017 1.020  1.005 - 1.030 Final  . pH, UA 10/03/2017 5.5  5.0 - 7.5 Final  . Color, UA 10/03/2017 Yellow  Yellow Final  . Appearance Ur 10/03/2017 Clear  Clear Final  . Leukocytes, UA  10/03/2017 Negative  Negative Final  . Protein, UA 10/03/2017 1+* Negative/Trace Final  . Glucose, UA 10/03/2017 Negative  Negative Final  . Ketones, UA 10/03/2017 Negative  Negative Final  . RBC, UA 10/03/2017 Negative  Negative Final  . Bilirubin, UA 10/03/2017 Negative  Negative Final  . Urobilinogen, Ur 10/03/2017 0.2  0.2 - 1.0 mg/dL Final  . Nitrite, UA 10/03/2017 Negative  Negative Final  . Microscopic Examination 10/03/2017 See below:   Final  . WBC, UA 10/03/2017 0-5  0 - 5 /hpf Final  . RBC, UA 10/03/2017 None seen  0 - 2 /hpf Final  . Epithelial Cells (non renal) 10/03/2017 0-10  0 - 10 /hpf Final  . Casts 10/03/2017 Present* None seen /lpf Final  . Cast Type 10/03/2017 Hyaline casts  N/A Final  . Mucus, UA 10/03/2017 Present* Not Estab. Final  . Bacteria, UA 10/03/2017 None seen  None seen/Few Final    Assessment:  [name]. is a 72 y.o. male with stage IIIC colon cancer. He presented with symptomatic anemia.  He underwent descending and proximal sigmoid colectomy, left ureteral lysis, and partial cecectomy on 06/23/2013. Six of 22 lymph nodes were positive.  There were tumor deposits (discontinuous extramural extension).  There was lymphovascular and perineural invasion. Pathologic stage was IIIC (T4bN2a M0).  He received FOLFOX chemotherapy from 07/22/2013 until 01/06/2014.    Chest, abdomen, and pelvic CT on 01/12/2016 revealed no evidence of recurrent or metastatic carcinoma.  Abdomen and pelvic CT with and without contrast on 09/13/2017 revealed no multiple simple renal cysts.  Liver was unremarkable.  CEA has been followed:  4.3 on 06/20/2013, 1.8 on 01/06/2014, 1.5 on 09/16/2014, 1.1 on 01/14/2015, 1.5 on 05/16/2015, 1.6 on 09/12/2015, 1.3 on 01/20/2016, 1.6 on 08/24/2016, 1.2 on 01/09/2017, and 1.1 on 09/27/2017.  Colonoscopy on 04/12/2014 revealed several polyps.  There was no dysplasia or malignancy.  Colonoscopy on 12/11/2016 revealed 10 polyps (2-8 mm) in the  ascending proximal transverse, descending, proximal sigmoid and rectum.  Pathology revealed multiple tubular adenomas without dysplasia or malignancy.  He was diagnosed with pyelonephritis.  He is undergoing evaluation for hematuria.  CT scan was negative.  Cystoscopy on 10/03/2017 revealed prostate enlargement and no evidence of bladder pathology.  He has a mild pancytopenia.  He received a course of Septra (08/26/2017 - 09/02/2017).  He has a history of a mild normocytic anemia.  Diet is modest.  He denies herbal products.  He denies any melena or hematochezia.  Work-up on 09/27/2017 revealed a hematocrit 31.1, hemoglobin 10.6, MCV 94, platelets 141,000, WBC 2700 with an ANC of 1600.  Creatinine was 1.32.  He has B12 deficiency.  B12 was low (149).  Negative  studies included:  folate (12.3), TSH, hepatitis B core antibody total, and hepatitis C antibody. Retic was 1.8%.  Ferritin was 103 with a iron saturation of 25% and a TIBC of 201.   Creatinine was 1.32.  Echocardiogram revealed an EF was 20-25% on 02/18/2015.  Cardiac catheterization on 02/24/2015 revealed 80% occlusion of the proximal left circumflex.  He managed medically.  Symptomatically, he feels good.  Exam is unremarkable.  Plan: 1.  Pancytopenia:  Review mild pancytopenia with a mild normocytic anemia, mild thrombocytopenia, and mild leukopenia.  Etiology likely multifactorial:  Anemia associated with renal failure while hospitalized, hematuria, Septra, and B12 deficiency.  Recheck CBC in 1 month 2.  B12 deficiency - new.  B12 level is low.  Etiology unclear (diet, pernicious anemia)  Discuss initiation of oral B12 x 1 month and B12 level.  If B12 level remains low after 1 month, discuss initiation of B12 injections. 3.  Stage III C colon cancer:  Recent CT scans revealed no evidence of malignancy.  Colonoscopy in 12/2016 revealed multiple polyps.  Follow-up with Dr. Gustavo Lah. 4.  Hematuria:  CT scan was  negative.  Cystoscopy revealed no bladder abnormalities. 5.  RTC in 1 month for MD assessment, labs (CBC with diff, B12, anti-parietal antibody, intrinsic factor antibody- day before), and +/- B12 injection.   Lequita Asal, MD  10/04/17, 11:15 AM

## 2017-10-04 NOTE — Patient Instructions (Signed)
  B12 deficiency   B12 take 1000 mcg once a day.

## 2017-10-06 NOTE — Progress Notes (Signed)
   10/06/17  CC:  Chief Complaint  Patient presents with  . Cysto    HPI: 72 year old male seen as an inpatient consultation by Dr. Alyson Ingles on 08/17/2017 for left pyelonephritis, left hydronephrosis and history of gross hematuria.  He denies recurrent hematuria.  Follow-up CT urogram showed bilateral renal cysts and no evidence of solid mass and resolution of his hydronephrosis.  He also had a scrotal ultrasound which showed an epididymal cyst.  Blood pressure (!) 160/70, pulse 71, height 5\' 9"  (1.753 m), weight 182 lb 3.2 oz (82.6 kg). NED. A&Ox3.   No respiratory distress   Abd soft, NT, ND Normal phallus with bilateral descended testicles  Cystoscopy Procedure Note  Patient identification was confirmed, informed consent was obtained, and patient was prepped using Betadine solution.  Lidocaine jelly was administered per urethral meatus.    Preoperative abx where received prior to procedure.     Pre-Procedure: - Inspection reveals a normal caliber ureteral meatus.  Procedure: The flexible cystoscope was introduced without difficulty - No urethral strictures/lesions are present. - Moderate lateral lobe enlargement prostate  - Elevated bladder neck - Bilateral ureteral orifices identified - Bladder mucosa  reveals no ulcers, tumors, or lesions - No bladder stones - No trabeculation  Retroflexion shows moderate intravesical median lobe   Post-Procedure: - Patient tolerated the procedure well  Assessment/ Plan: Cystoscopy shows prostate enlargement and no evidence of bladder pathology.  Recommend a follow-up with Zara Council in months for repeat UA.

## 2017-10-22 ENCOUNTER — Other Ambulatory Visit: Payer: Self-pay | Admitting: *Deleted

## 2017-10-22 NOTE — Patient Outreach (Signed)
[facility]) Care Management  10/22/2017  Garlon Tuggle 26-Jan-1946 [id number]   RN Health Coach attempted follow up outreach call to patient.  Patient was unavailable. Per home number message. This number has been disconnected.  Multiple attempts to establish contact with patient without success. No response from letter mailed to patient. Case is being closed at this time.  Plan : Case closure due to unable to contact   [place] [facility] [phone]

## 2017-11-04 ENCOUNTER — Inpatient Hospital Stay: Payer: PPO | Attending: Hematology and Oncology

## 2017-11-04 DIAGNOSIS — C187 Malignant neoplasm of sigmoid colon: Secondary | ICD-10-CM

## 2017-11-04 DIAGNOSIS — C189 Malignant neoplasm of colon, unspecified: Secondary | ICD-10-CM | POA: Diagnosis not present

## 2017-11-04 DIAGNOSIS — R319 Hematuria, unspecified: Secondary | ICD-10-CM | POA: Diagnosis not present

## 2017-11-04 DIAGNOSIS — E1122 Type 2 diabetes mellitus with diabetic chronic kidney disease: Secondary | ICD-10-CM | POA: Diagnosis not present

## 2017-11-04 DIAGNOSIS — R809 Proteinuria, unspecified: Secondary | ICD-10-CM | POA: Diagnosis not present

## 2017-11-04 DIAGNOSIS — I129 Hypertensive chronic kidney disease with stage 1 through stage 4 chronic kidney disease, or unspecified chronic kidney disease: Secondary | ICD-10-CM | POA: Diagnosis not present

## 2017-11-04 DIAGNOSIS — N179 Acute kidney failure, unspecified: Secondary | ICD-10-CM | POA: Diagnosis not present

## 2017-11-04 DIAGNOSIS — N183 Chronic kidney disease, stage 3 (moderate): Secondary | ICD-10-CM | POA: Diagnosis not present

## 2017-11-04 LAB — CBC WITH DIFFERENTIAL/PLATELET
Basophils Absolute: 0 10*3/uL (ref 0–0.1)
Basophils Relative: 1 %
Eosinophils Absolute: 0.1 10*3/uL (ref 0–0.7)
Eosinophils Relative: 2 %
HCT: 32.8 % — ABNORMAL LOW (ref 40.0–52.0)
Hemoglobin: 11.1 g/dL — ABNORMAL LOW (ref 13.0–18.0)
Lymphocytes Relative: 20 %
Lymphs Abs: 0.6 10*3/uL — ABNORMAL LOW (ref 1.0–3.6)
MCH: 31.6 pg (ref 26.0–34.0)
MCHC: 33.9 g/dL (ref 32.0–36.0)
MCV: 93.4 fL (ref 80.0–100.0)
Monocytes Absolute: 0.4 10*3/uL (ref 0.2–1.0)
Monocytes Relative: 14 %
Neutro Abs: 1.9 10*3/uL (ref 1.4–6.5)
Neutrophils Relative %: 63 %
Platelets: 132 10*3/uL — ABNORMAL LOW (ref 150–440)
RBC: 3.52 MIL/uL — ABNORMAL LOW (ref 4.40–5.90)
RDW: 16 % — ABNORMAL HIGH (ref 11.5–14.5)
WBC: 3 10*3/uL — ABNORMAL LOW (ref 3.8–10.6)

## 2017-11-05 ENCOUNTER — Inpatient Hospital Stay: Payer: PPO

## 2017-11-05 ENCOUNTER — Other Ambulatory Visit: Payer: Self-pay | Admitting: Hematology and Oncology

## 2017-11-05 ENCOUNTER — Inpatient Hospital Stay: Payer: PPO | Attending: Hematology and Oncology | Admitting: Hematology and Oncology

## 2017-11-05 VITALS — BP 151/75 | HR 60 | Temp 96.4°F | Resp 18 | Wt 186.0 lb

## 2017-11-05 DIAGNOSIS — C189 Malignant neoplasm of colon, unspecified: Secondary | ICD-10-CM | POA: Diagnosis not present

## 2017-11-05 DIAGNOSIS — Z79899 Other long term (current) drug therapy: Secondary | ICD-10-CM | POA: Insufficient documentation

## 2017-11-05 DIAGNOSIS — D72819 Decreased white blood cell count, unspecified: Secondary | ICD-10-CM

## 2017-11-05 DIAGNOSIS — D649 Anemia, unspecified: Secondary | ICD-10-CM

## 2017-11-05 DIAGNOSIS — E538 Deficiency of other specified B group vitamins: Secondary | ICD-10-CM

## 2017-11-05 DIAGNOSIS — D696 Thrombocytopenia, unspecified: Secondary | ICD-10-CM

## 2017-11-05 DIAGNOSIS — D61818 Other pancytopenia: Secondary | ICD-10-CM

## 2017-11-05 LAB — INTRINSIC FACTOR ANTIBODIES: Intrinsic Factor: 0.9 AU/mL (ref 0.0–1.1)

## 2017-11-05 LAB — ANTI-PARIETAL ANTIBODY: Parietal Cell Antibody-IgG: 1.7 Units (ref 0.0–20.0)

## 2017-11-05 LAB — VITAMIN B12: Vitamin B-12: 322 pg/mL (ref 180–914)

## 2017-11-05 NOTE — Progress Notes (Signed)
Patient offers no complaints today. 

## 2017-11-05 NOTE — Progress Notes (Signed)
[facility] day:  11/05/17   Chief Complaint: Christopher Sees. is a 72 y.o. male with a history of stage IIIC colon cancer, pancytopenia, and B12 deficiency who is seen for 1 month assessment on oral B12.   HPI: The patient was last seen in the medical oncology clinic on 10/04/2017.  At that time, he felt good.  Work-up revealed B12 deficiency.  B12 was low (149).  We discussed initiation of oral B12 x 1 month.  He was seen in follow up consult on 10/03/2017 by Dr. John Giovanni (urology).  Notes reviewed.  Patient denied recurrent hematuria.  CT urogram showed bilateral renal cyst with no evidence of a solid mass.  Recent scrotal ultrasound demonstrated an epididymal cyst.  Cystoscopy performed that showed moderate prostatic enlargement, and no evidence of bladder pathology.  Patient to follow-up with urology APP.  Labs on 11/04/2017 revealed a hematocrit of 32.8, hemoglobin 11.1, platelets 132,000, WBC 3000 with an ANC of 1900.  B12 was 322 (low normal).  Anti-parietal cell antibody and intrinsic factor antibody were negative.  During the interim, patient has been doing well. He denies any acute concerns today. He feels generally well. Energy level remains stable. Patient denies that he has experienced any B symptoms. Hedenies any interval infections. Patient denies bleeding; no hematochezia, melena, or gross hematuria.   Patient advises that he maintains an adequate appetite. He is eating well. Weight today is 186 lb (84.4 kg), which compared to his last visit to the clinic, represents a 4 pound increase.  Patient denies pain in the clinic today.   Past Medical History:  Diagnosis Date  . Anemia   . CAD (coronary artery disease)    a. 02/2015 Cath: LM nl, LAD 50/40 mid/distal, LCX 80p, RCA 40p, 30d, RPL1 40, CO 3.27, CI 1.65; b. cath 06/09/15: LM minor irregs, m-dLAD 50%, dLAD 40%, pLCx 80% s/p PCI/DES 0%, pRCA 40%, dRCA 30%, 1st RPLB 40%  .  Chronic systolic CHF (congestive heart failure) ([place])    a. 02/2015 Echo: EF 20-25%, diff HK, mod MR, mildy dil LA, mildly dil RV w/ mod RV syst dysfxn, mildly dil RA, mod TR, PASP 68mHg.  . [place]    a. 2015 s/p colectomy followed by chemoRx with oxaliplatin & fluorouacil.  . Diabetes mellitus without complication ([place]   . Hypertension   . Hypertensive heart disease   . Mixed Ischemic and Non-ischemic Cardiomyopathy    a. 02/2015 Echo: EF 20-25%, diff HK.  . Moderate mitral regurgitation   . Moderate tricuspid regurgitation   . Tubular adenoma of colon     Past Surgical History:  Procedure Laterality Date  . CARDIAC CATHETERIZATION Bilateral 02/24/2015   Procedure: Right/Left Heart Cath and Coronary Angiography;  Surgeon: MWellington Hampshire MD;  [facility];  Service: Cardiovascular;  Laterality: Bilateral;  . CARDIAC CATHETERIZATION N/A 06/09/2015   Procedure: Coronary Stent Intervention;  Surgeon: MWellington Hampshire MD;  [facility];  Service: Cardiovascular;  Laterality: N/A;  . COLON SURGERY     Colectomy  . COLONOSCOPY    . COLONOSCOPY WITH PROPOFOL N/A 12/11/2016   Procedure: COLONOSCOPY WITH PROPOFOL;  Surgeon: SLollie Sails MD;  [facility];  Service: Endoscopy;  Laterality: N/A;  . ESOPHAGOGASTRODUODENOSCOPY    . PORT-A-CATH REMOVAL N/A 07/12/2014   Procedure: REMOVAL PORT-A-CATH;  Surgeon: WMolly Maduro MD;  Location: [facility];  Service: General;  Laterality:  N/A;  . PORTACATH PLACEMENT Left     No family history on file.  Social History:  reports that he has never smoked. He has never used smokeless tobacco. He reports that he does not drink alcohol or use drugs.  He has retired from the city.  The patient lives in [place].  He is accompanied by his wife, [name], today.  Allergies:  Allergies  Allergen Reactions  . Shellfish Allergy Other (See Comments)    Pt. instructed by MD to avoid seafood     Current Medications: Current Outpatient Medications  Medication Sig Dispense Refill  . aspirin EC 81 MG tablet Take 1 tablet (81 mg total) by mouth daily. 90 tablet 3  . atorvastatin (LIPITOR) 40 MG tablet Take 1 tablet (40 mg total) by mouth daily. 90 tablet 1  . carvedilol (COREG) 3.125 MG tablet take 1 tablet by mouth twice a day 180 tablet 2  . gabapentin (NEURONTIN) 100 MG capsule Take 2 capsules (200 mg total) by mouth 3 (three) times daily. (Patient taking differently: Take 100 mg by mouth 3 (three) times daily. ) 180 capsule 0  . losartan (COZAAR) 25 MG tablet Take 1 tablet (25 mg total) by mouth daily. 90 tablet 1  . tamsulosin (FLOMAX) 0.4 MG CAPS capsule Take 1 capsule (0.4 mg total) by mouth daily after supper. 30 capsule 11   No current facility-administered medications for this visit.     Review of Systems  Constitutional: Negative for diaphoresis, fever, malaise/fatigue and weight loss (up 4 pounds).       "I feel pretty good".  Energy stable.  HENT: Negative.   Eyes: Negative.   Respiratory: Negative for cough, hemoptysis, sputum production and shortness of breath.   Cardiovascular: Negative for chest pain, palpitations, orthopnea, leg swelling and PND.  Gastrointestinal: Negative for abdominal pain, blood in stool, constipation, diarrhea, melena, nausea and vomiting.  Genitourinary: Negative for dysuria, frequency, hematuria and urgency.  Musculoskeletal: Negative for back pain, falls, joint pain and myalgias.  Skin: Negative for itching and rash.  Neurological: Negative for dizziness, tremors, weakness and headaches.  Endo/Heme/Allergies: Does not bruise/bleed easily.  Psychiatric/Behavioral: Negative for depression, memory loss and suicidal ideas. The patient is not nervous/anxious and does not have insomnia.   All other systems reviewed and are negative.  Performance status (ECOG): 0 - Asymptomatic  Vital Signs: BP (!) 151/75 (BP Location: Left Arm, Patient  Position: Sitting)   Pulse 60   Temp (!) 96.4 F (35.8 C) (Tympanic)   Resp 18   Wt 186 lb (84.4 kg)   BMI 27.47 kg/m   Physical Exam  Constitutional: He is oriented to person, place, and time and well-developed, well-nourished, and in no distress.  HENT:  Head: Normocephalic and atraumatic.  Alopecia.  Eyes: Pupils are equal, round, and reactive to light. EOM are normal. No scleral icterus.  Brown eyes  Neck: Normal range of motion. Neck supple. No tracheal deviation present. No thyromegaly present.  Cardiovascular: Normal rate, regular rhythm and normal heart sounds. Exam reveals no gallop and no friction rub.  No murmur heard. Pulmonary/Chest: Effort normal and breath sounds normal. No respiratory distress. He has no wheezes. He has no rales.  Abdominal: Soft. Bowel sounds are normal. He exhibits no distension. There is no tenderness.  Musculoskeletal: Normal range of motion. He exhibits no edema or tenderness.  Lymphadenopathy:    He has no cervical adenopathy.    He has no axillary adenopathy.  Right: No inguinal and no supraclavicular adenopathy present.       Left: No inguinal and no supraclavicular adenopathy present.  Neurological: He is alert and oriented to person, place, and time.  Skin: Skin is warm and dry. No rash noted. No erythema.  Psychiatric: Mood, affect and judgment normal.  Nursing note and vitals reviewed.   Imaging studies: 01/11/2014:  Chest, abdomen, and pelvic CT revealed no evidence of metastatic disease. He was noted to have stable splenomegaly. There was some progression of tree-in-bud changes in the lungs, questionable MAC infection. There were chronic inflammatory changes. 01/12/2015:  Chest, abdomen, and pelvic CT revealed no evidence of metastatic disease in the chest, abdomen or pelvis.  There was a solitary 4 mm right upper lobe pulmonary nodule (stable). There was mild cardiomegaly (increased).  There was mild patchy ground-glass opacity  and interlobular septal thickening throughout both lungs, suggestive of pulmonary edema, concerning for developing congestive heart failure.  There was stable dilated main pulmonary artery, suggesting pulmonary arterial hypertension.  There was left main and 3 vessel coronary atherosclerosis.   01/12/2016:  Chest, abdomen, and pelvic CT revealed no evidence of recurrent or metastatic carcinoma.  There was cholelithiasis without radiographic evidence of cholecystitis.  There was aortic and coronary artery atherosclerosis. 08/15/2017:  Abdomen and pelvic CT without contrast revealed mild left hydronephroureter and bilateral renal hypodense lesions.   08/16/2017:  Abdomen MRI revealed abnormal appearance of the left kidney with striated appearance suggesting edema/infiltration, mild asymmetric left perirenal stranding, and heterogeneous debris in the left collecting system suspicious for pyelonephritis.   09/13/2017:  Abdomen and pelvic CT with and without contrast revealed no definite source for hematuria.  There were multiple simple cysts. No liver lesions.   Appointment on 11/04/2017  Component Date Value Ref Range Status  . Intrinsic Factor 11/04/2017 0.9  0.0 - 1.1 AU/mL Final   Comment: (NOTE) Performed At: [facility] [place], [place] [id number] Rush Farmer MD WU:[id number]   . Parietal Cell Antibody-IgG 11/04/2017 1.7  0.0 - 20.0 Units Final   Comment: (NOTE)                                Negative    0.0 - 20.0                                Equivocal  20.1 - 24.9                                Positive         >24.9 Parietal Cell Antibodies are found in 90% of patients with pernicious anemia and 30% of first degree relatives with pernicious anemia. Performed At: [facility] [place], [place] [id number] Rush Farmer MD VH:[id number]   . Vitamin B-12 11/04/2017 322  180 - 914 pg/mL Final   Comment: (NOTE) This assay is not  validated for testing neonatal or myeloproliferative syndrome specimens for Vitamin B12 levels. Performed at [facility], [place] [address], [place], [place] [address]   . WBC 11/04/2017 3.0* 3.8 - 10.6 K/uL Final  . RBC 11/04/2017 3.52* 4.40 - 5.90 MIL/uL Final  . Hemoglobin 11/04/2017 11.1* 13.0 - 18.0 g/dL Final  . HCT 11/04/2017 32.8* 40.0 - 52.0 % Final  . MCV  11/04/2017 93.4  80.0 - 100.0 fL Final  . MCH 11/04/2017 31.6  26.0 - 34.0 pg Final  . MCHC 11/04/2017 33.9  32.0 - 36.0 g/dL Final  . RDW 11/04/2017 16.0* 11.5 - 14.5 % Final  . Platelets 11/04/2017 132* 150 - 440 K/uL Final  . Neutrophils Relative % 11/04/2017 63  % Final  . Neutro Abs 11/04/2017 1.9  1.4 - 6.5 K/uL Final  . Lymphocytes Relative 11/04/2017 20  % Final  . Lymphs Abs 11/04/2017 0.6* 1.0 - 3.6 K/uL Final  . Monocytes Relative 11/04/2017 14  % Final  . Monocytes Absolute 11/04/2017 0.4  0.2 - 1.0 K/uL Final  . Eosinophils Relative 11/04/2017 2  % Final  . Eosinophils Absolute 11/04/2017 0.1  0 - 0.7 K/uL Final  . Basophils Relative 11/04/2017 1  % Final  . Basophils Absolute 11/04/2017 0.0  0 - 0.1 K/uL Final   Performed at [facility], [address], [place], [place] [address]    Assessment:  Christopher Guzzo. is a 73 y.o. male with stage IIIC colon cancer. He presented with symptomatic anemia.  He underwent descending and proximal sigmoid colectomy, left ureteral lysis, and partial cecectomy on 06/23/2013. Six of 22 lymph nodes were positive.  There were tumor deposits (discontinuous extramural extension).  There was lymphovascular and perineural invasion. Pathologic stage was IIIC (T4bN2a M0).  He received FOLFOX chemotherapy from 07/22/2013 until 01/06/2014.    Chest, abdomen, and pelvic CT on 01/12/2016 revealed no evidence of recurrent or metastatic carcinoma.  Abdomen and pelvic CT with and without contrast on 09/13/2017 revealed no multiple simple renal cysts.  Liver was  unremarkable.  CEA has been followed:  4.3 on 06/20/2013, 1.8 on 01/06/2014, 1.5 on 09/16/2014, 1.1 on 01/14/2015, 1.5 on 05/16/2015, 1.6 on 09/12/2015, 1.3 on 01/20/2016, 1.6 on 08/24/2016, 1.2 on 01/09/2017, and 1.1 on 09/27/2017.  Colonoscopy on 04/12/2014 revealed several polyps.  There was no dysplasia or malignancy.  Colonoscopy on 12/11/2016 revealed 10 polyps (2-8 mm) in the ascending proximal transverse, descending, proximal sigmoid and rectum.  Pathology revealed multiple tubular adenomas without dysplasia or malignancy.  He was diagnosed with pyelonephritis.  He is undergoing evaluation for hematuria.  CT scan was negative.  Cystoscopy on 10/03/2017 revealed prostate enlargement and no evidence of bladder pathology.  He has a mild pancytopenia.  He received a course of Septra (08/26/2017 - 09/02/2017).  He has a history of a mild normocytic anemia.  Diet is modest.  He denies herbal products.  He denies any melena or hematochezia.  Work-up on 09/27/2017 revealed a hematocrit 31.1, hemoglobin 10.6, MCV 94, platelets 141,000, WBC 2700 with an ANC of 1600.  Creatinine was 1.32.  He has B12 deficiency.  B12 was low (149).  Anti-parietal cell antibody and intrinsic factor antibody were negative.   Negative studies included:  folate (12.3), TSH, hepatitis B core antibody total, and hepatitis C antibody. Retic was 1.8%.  Ferritin was 103 with a iron saturation of 25% and a TIBC of 201.   Creatinine was 1.32.  Echocardiogram revealed an EF was 20-25% on 02/18/2015.  Cardiac catheterization on 02/24/2015 revealed 80% occlusion of the proximal left circumflex.  He managed medically.  Symptomatically, patient is doing well.  He offers no acute complaints.  Patient is not experiencing the symptoms.  He denies recent or recurrent infections.  No bleeding.  Exam is unremarkable.  Plan: 1. Review labs from 11/04/2017.  Mild pancytopenia persists.  B12 level slightly improved.  2. Stage IIIC colon  cancer  Doing well.  No symptoms.  CEA stable at 1.1 ng/mL on 09/27/2017.  Recent CT imaging revealed no evidence of malignancy.  Last colonoscopy in 12/2016 revealed multiple colonic polyps.  Follow-up with gastroenterology Christopher Lah, MD). 3. Pancytopenia  WBC 3000 ([place] 1900).  Hemoglobin 11.1, hematocrit 32.8, MCV 93.4, and platelets 132,000.  Etiology multifactorial and associated with anemia of chronic disease, hematuria, and vitamin deficiencies.  Will recheck CBC in 1 month. 4. B12 deficiency  B12 level remains low at 322 pg/mL.  Etiology of vitamin deficiency unclear, however most likely related to dietary components.  Antiparietal and intrinsic factor antibodies normal - no pernicious anemia  Continue oral B12 1000 mcg supplementation daily.  Will recheck level in 1 month.  If not improving, anticipate par enteral supplementation weekly x6, then monthly.  5. Hematuria  Discuss interval consult with urology.  Cystoscopy showed moderate prostatic enlargement and no evidence of bladder pathology.  Hematuria had resolved.  Patient to follow-up with urology APP for repeat UA and further evaluation. 6. RTC in 1 month for MD assessment, labs (CBC with differential, B12 - day before), and +/- B12 injection.    Honor Loh, NP  11/05/17, 11:54 AM   I saw and evaluated the patient, participating in the key portions of the service and reviewing pertinent diagnostic studies and records.  I reviewed the nurse practitioner's note and agree with the findings and the plan.  The assessment and plan were discussed with the patient.  Several questions were asked by the patient and answered.   Nolon Stalls, MD 11/05/2017,5:25 AM

## 2017-11-10 ENCOUNTER — Encounter: Payer: Self-pay | Admitting: Hematology and Oncology

## 2017-11-26 ENCOUNTER — Ambulatory Visit: Payer: PPO | Admitting: Cardiovascular Disease

## 2017-11-26 DIAGNOSIS — R0989 Other specified symptoms and signs involving the circulatory and respiratory systems: Secondary | ICD-10-CM

## 2017-11-27 ENCOUNTER — Encounter: Payer: Self-pay | Admitting: Cardiovascular Disease

## 2017-12-05 ENCOUNTER — Inpatient Hospital Stay: Payer: PPO

## 2017-12-05 DIAGNOSIS — E538 Deficiency of other specified B group vitamins: Secondary | ICD-10-CM

## 2017-12-05 DIAGNOSIS — D61818 Other pancytopenia: Secondary | ICD-10-CM | POA: Diagnosis not present

## 2017-12-05 LAB — CBC WITH DIFFERENTIAL/PLATELET
Abs Immature Granulocytes: 0.01 10*3/uL (ref 0.00–0.07)
Basophils Absolute: 0 10*3/uL (ref 0.0–0.1)
Basophils Relative: 1 %
Eosinophils Absolute: 0.1 10*3/uL (ref 0.0–0.5)
Eosinophils Relative: 3 %
HCT: 33.4 % — ABNORMAL LOW (ref 39.0–52.0)
Hemoglobin: 11.2 g/dL — ABNORMAL LOW (ref 13.0–17.0)
Immature Granulocytes: 0 %
Lymphocytes Relative: 19 %
Lymphs Abs: 0.7 10*3/uL (ref 0.7–4.0)
MCH: 31 pg (ref 26.0–34.0)
MCHC: 33.5 g/dL (ref 30.0–36.0)
MCV: 92.5 fL (ref 80.0–100.0)
Monocytes Absolute: 0.4 10*3/uL (ref 0.1–1.0)
Monocytes Relative: 12 %
Neutro Abs: 2.4 10*3/uL (ref 1.7–7.7)
Neutrophils Relative %: 65 %
Platelets: 137 10*3/uL — ABNORMAL LOW (ref 150–400)
RBC: 3.61 MIL/uL — ABNORMAL LOW (ref 4.22–5.81)
RDW: 13.2 % (ref 11.5–15.5)
WBC: 3.6 10*3/uL — ABNORMAL LOW (ref 4.0–10.5)
nRBC: 0 % (ref 0.0–0.2)

## 2017-12-06 ENCOUNTER — Other Ambulatory Visit: Payer: PPO

## 2017-12-06 ENCOUNTER — Encounter: Payer: Self-pay | Admitting: Hematology and Oncology

## 2017-12-06 ENCOUNTER — Other Ambulatory Visit: Payer: Self-pay | Admitting: Hematology and Oncology

## 2017-12-06 ENCOUNTER — Inpatient Hospital Stay: Payer: PPO | Attending: Hematology and Oncology | Admitting: Hematology and Oncology

## 2017-12-06 ENCOUNTER — Inpatient Hospital Stay: Payer: PPO

## 2017-12-06 VITALS — BP 166/90 | HR 64 | Temp 97.3°F | Resp 18 | Wt 188.2 lb

## 2017-12-06 DIAGNOSIS — C189 Malignant neoplasm of colon, unspecified: Secondary | ICD-10-CM

## 2017-12-06 DIAGNOSIS — E538 Deficiency of other specified B group vitamins: Secondary | ICD-10-CM

## 2017-12-06 DIAGNOSIS — D696 Thrombocytopenia, unspecified: Secondary | ICD-10-CM

## 2017-12-06 DIAGNOSIS — D61818 Other pancytopenia: Secondary | ICD-10-CM | POA: Diagnosis not present

## 2017-12-06 DIAGNOSIS — C187 Malignant neoplasm of sigmoid colon: Secondary | ICD-10-CM

## 2017-12-06 DIAGNOSIS — D72819 Decreased white blood cell count, unspecified: Secondary | ICD-10-CM

## 2017-12-06 LAB — VITAMIN B12: Vitamin B-12: 227 pg/mL (ref 180–914)

## 2017-12-06 MED ORDER — CYANOCOBALAMIN 1000 MCG/ML IJ SOLN
1000.0000 ug | Freq: Once | INTRAMUSCULAR | Status: AC
  Administered 2017-12-06: 1000 ug via INTRAMUSCULAR
  Filled 2017-12-06: qty 1

## 2017-12-06 NOTE — Progress Notes (Signed)
[facility] day:  12/06/17   Chief Complaint: Mackinley Kiehn. is a 72 y.o. male with a history of stage IIIC colon cancer, pancytopenia, and B12 deficiency who is seen for 1 month assessment on oral B12.   HPI: The patient was last seen in the medical oncology clinic on 11/05/2017.  At that time, he was doing well.  He had no acute complaints.  He denied any bleeding.  Exam was unremarkable.  He continued oral B12.  We discussed follow-up with GI.  Labs on 12/05/2017 revealed a hematocrit of 33.4, hemoglobin 11.2, MCV 92.5, platelets 137,000, WBC 3600 with an ANC of 2400.  B12 was 227.  During the interim, he has done well.  He voices no concerns.  He denies any GI bleeding,  He denies any infections or fevers.   Past Medical History:  Diagnosis Date  . Acute renal failure (ARF) ([place]) 08/14/2017  . Anemia   . CAD (coronary artery disease)    a. 02/2015 Cath: LM nl, LAD 50/40 mid/distal, LCX 80p, RCA 40p, 30d, RPL1 40, CO 3.27, CI 1.65; b. cath 06/09/15: LM minor irregs, m-dLAD 50%, dLAD 40%, pLCx 80% s/p PCI/DES 0%, pRCA 40%, dRCA 30%, 1st RPLB 40%  . Chronic systolic CHF (congestive heart failure) ([place])    a. 02/2015 Echo: EF 20-25%, diff HK, mod MR, mildy dil LA, mildly dil RV w/ mod RV syst dysfxn, mildly dil RA, mod TR, PASP 20mHg; b. 09/2017 Echo: EF 40-45%, diff HK. Gr1 DD. Mild to mod MR. Mildly to mod dil LA.  . [place] cancer ([place]    a. 2015 s/p colectomy followed by chemoRx with oxaliplatin & fluorouacil.  . [place]ia hypertension 07/20/2014  . [place]   . Hypertension   . Hypertensive heart disease   . Mixed Ischemic and Non-ischemic Cardiomyopathy    a. 02/2015 Echo: EF 20-25%, diff HK; b. 09/2016 Echo: EF 40-45%.  . Moderate mitral regurgitation    a. 09/2017 Echo: Mild to mod TR.  . Tubular adenoma of colon     Past Surgical History:  Procedure Laterality Date  . CARDIAC CATHETERIZATION Bilateral  02/24/2015   Procedure: Right/Left Heart Cath and Coronary Angiography;  Surgeon: MWellington Hampshire MD;  [facility];  Service: Cardiovascular;  Laterality: Bilateral;  . CARDIAC CATHETERIZATION N/A 06/09/2015   Procedure: Coronary Stent Intervention;  Surgeon: MWellington Hampshire MD;  [facility];  Service: Cardiovascular;  Laterality: N/A;  . COLON SURGERY     Colectomy  . COLONOSCOPY    . COLONOSCOPY WITH PROPOFOL N/A 12/11/2016   Procedure: COLONOSCOPY WITH PROPOFOL;  Surgeon: SLollie Sails MD;  [facility];  Service: Endoscopy;  Laterality: N/A;  . ESOPHAGOGASTRODUODENOSCOPY    . PORT-A-CATH REMOVAL N/A 07/12/2014   Procedure: REMOVAL PORT-A-CATH;  Surgeon: WMolly Maduro MD;  Location: [facility];  Service: General;  Laterality: N/A;  . PORTACATH PLACEMENT Left     History reviewed. No pertinent family history.  Social History:  reports that he has never smoked. He has never used smokeless tobacco. He reports that he does not drink alcohol or use drugs.  He has [place] from the [place].  [place]  He is accompanied by his wife, JRomie Minus today.  Allergies:  Allergies  Allergen Reactions  . Shellfish Allergy Other (See Comments)    Pt. instructed by MD to avoid seafood  Current Medications: Current Outpatient Medications  Medication Sig Dispense Refill  . aspirin EC 81 MG tablet Take 1 tablet (81 mg total) by mouth daily. 90 tablet 3  . atorvastatin (LIPITOR) 40 MG tablet Take 1 tablet (40 mg total) by mouth daily. 90 tablet 1  . carvedilol (COREG) 3.125 MG tablet take 1 tablet by mouth twice a day 180 tablet 2  . gabapentin (NEURONTIN) 100 MG capsule Take 2 capsules (200 mg total) by mouth 3 (three) times daily. 180 capsule 0  . tamsulosin (FLOMAX) 0.4 MG CAPS capsule Take 1 capsule (0.4 mg total) by mouth daily after supper. 30 capsule 11  . cephALEXin (KEFLEX) 500 MG capsule Take 1 capsule (500 mg total) by  mouth 2 (two) times daily. 14 capsule 0  . losartan (COZAAR) 50 MG tablet Take 1 tablet (50 mg total) by mouth daily. 90 tablet 3   No current facility-administered medications for this visit.     Review of Systems  Constitutional: Negative.  Negative for diaphoresis, fever, malaise/fatigue and weight loss (up 2 pounds).       No problems.  HENT: Negative.  Negative for congestion, ear discharge, ear pain, sinus pain and sore throat.   Eyes: Negative.  Negative for blurred vision, double vision and pain.  Respiratory: Negative.  Negative for cough, hemoptysis, sputum production and shortness of breath.   Cardiovascular: Negative.  Negative for chest pain, palpitations, orthopnea, leg swelling and PND.  Gastrointestinal: Negative.  Negative for abdominal pain, blood in stool, constipation, diarrhea, melena, nausea and vomiting.  Genitourinary: Negative.  Negative for dysuria, frequency, hematuria and urgency.  Musculoskeletal: Negative.  Negative for back pain, falls, myalgias and neck pain.  Skin: Negative.  Negative for itching and rash.  Neurological: Negative.  Negative for dizziness, tremors, focal weakness, weakness and headaches.  Endo/Heme/Allergies: Negative.   Psychiatric/Behavioral: Negative.  Negative for depression, memory loss and suicidal ideas. The patient is not nervous/anxious and does not have insomnia.   All other systems reviewed and are negative.  Performance status (ECOG): 0  Vital Signs: BP (!) 166/90 (BP Location: Left Arm, Patient Position: Sitting) Comment: Pt states he knew it would be up - car stopped on them on the way here  Pulse 64   Temp (!) 97.3 F (36.3 C) (Tympanic)   Resp 18   Wt 188 lb 4 oz (85.4 kg)   BMI 27.80 kg/m   Physical Exam  Constitutional: He is oriented to person, place, and time and well-developed, well-nourished, and in no distress. No distress.  HENT:  Head: Normocephalic and atraumatic.  Mouth/Throat: Oropharynx is clear and  moist. No oropharyngeal exudate.  Alopecia.  Eyes: Pupils are equal, round, and reactive to light. Conjunctivae and EOM are normal. No scleral icterus.  Brown eyes.  Neck: Normal range of motion. Neck supple. No JVD present.  Cardiovascular: Regular rhythm and normal heart sounds. Exam reveals no gallop and no friction rub.  No murmur heard. Pulmonary/Chest: Effort normal and breath sounds normal. No respiratory distress. He has no wheezes. He has no rales.  Abdominal: Soft. Bowel sounds are normal. He exhibits no distension and no mass. There is no abdominal tenderness. There is no rebound and no guarding.  Musculoskeletal: Normal range of motion.        General: No tenderness or edema.  Lymphadenopathy:    He has no cervical adenopathy.    He has no axillary adenopathy.       Right: No inguinal and no supraclavicular  adenopathy present.       Left: No inguinal and no supraclavicular adenopathy present.  Neurological: He is alert and oriented to person, place, and time. Gait normal.  Skin: Skin is warm and dry. No rash noted. He is not diaphoretic. No erythema.  Psychiatric: Mood, affect and judgment normal.  Nursing note and vitals reviewed.   Imaging studies: 01/11/2014:  Chest, abdomen, and pelvic CT revealed no evidence of metastatic disease. He was noted to have stable splenomegaly. There was some progression of tree-in-bud changes in the lungs, questionable MAC infection. There were chronic inflammatory changes. 01/12/2015:  Chest, abdomen, and pelvic CT revealed no evidence of metastatic disease in the chest, abdomen or pelvis.  There was a solitary 4 mm right upper lobe pulmonary nodule (stable). There was mild cardiomegaly (increased).  There was mild patchy ground-glass opacity and interlobular septal thickening throughout both lungs, suggestive of pulmonary edema, concerning for developing congestive heart failure.  There was stable dilated main pulmonary artery, suggesting  pulmonary arterial hypertension.  There was left main and 3 vessel coronary atherosclerosis.   01/12/2016:  Chest, abdomen, and pelvic CT revealed no evidence of recurrent or metastatic carcinoma.  There was cholelithiasis without radiographic evidence of cholecystitis.  There was aortic and coronary artery atherosclerosis. 08/15/2017:  Abdomen and pelvic CT without contrast revealed mild left hydronephroureter and bilateral renal hypodense lesions.   08/16/2017:  Abdomen MRI revealed abnormal appearance of the left kidney with striated appearance suggesting edema/infiltration, mild asymmetric left perirenal stranding, and heterogeneous debris in the left collecting system suspicious for pyelonephritis.   09/13/2017:  Abdomen and pelvic CT with and without contrast revealed no definite source for hematuria.  There were multiple simple cysts. No liver lesions.   Appointment on 12/05/2017  Component Date Value Ref Range Status  . WBC 12/05/2017 3.6* 4.0 - 10.5 K/uL Final  . RBC 12/05/2017 3.61* 4.22 - 5.81 MIL/uL Final  . Hemoglobin 12/05/2017 11.2* 13.0 - 17.0 g/dL Final  . HCT 12/05/2017 33.4* 39.0 - 52.0 % Final  . MCV 12/05/2017 92.5  80.0 - 100.0 fL Final  . MCH 12/05/2017 31.0  26.0 - 34.0 pg Final  . MCHC 12/05/2017 33.5  30.0 - 36.0 g/dL Final  . RDW 12/05/2017 13.2  11.5 - 15.5 % Final  . Platelets 12/05/2017 137* 150 - 400 K/uL Final  . nRBC 12/05/2017 0.0  0.0 - 0.2 % Final  . Neutrophils Relative % 12/05/2017 65  % Final  . Neutro Abs 12/05/2017 2.4  1.7 - 7.7 K/uL Final  . Lymphocytes Relative 12/05/2017 19  % Final  . Lymphs Abs 12/05/2017 0.7  0.7 - 4.0 K/uL Final  . Monocytes Relative 12/05/2017 12  % Final  . Monocytes Absolute 12/05/2017 0.4  0.1 - 1.0 K/uL Final  . Eosinophils Relative 12/05/2017 3  % Final  . Eosinophils Absolute 12/05/2017 0.1  0.0 - 0.5 K/uL Final  . Basophils Relative 12/05/2017 1  % Final  . Basophils Absolute 12/05/2017 0.0  0.0 - 0.1 K/uL Final  .  Immature Granulocytes 12/05/2017 0  % Final  . Abs Immature Granulocytes 12/05/2017 0.01  0.00 - 0.07 K/uL Final   Performed at [facility], [address], [place], [place] [address]  . Vitamin B-12 12/05/2017 227  180 - 914 pg/mL Final   Comment: (NOTE) This assay is not validated for testing neonatal or myeloproliferative syndrome specimens for Vitamin B12 levels. Performed at [facility], [place],  [place] [address]     Assessment:  Haroun Cotham. is a 72 y.o. male with stage IIIC colon cancer. He presented with symptomatic anemia.  He underwent descending and proximal sigmoid colectomy, left ureteral lysis, and partial cecectomy on 06/23/2013. Six of 22 lymph nodes were positive.  There were tumor deposits (discontinuous extramural extension).  There was lymphovascular and perineural invasion. Pathologic stage was IIIC (T4bN2a M0).  He received FOLFOX chemotherapy from 07/22/2013 until 01/06/2014.    Chest, abdomen, and pelvic CT on 01/12/2016 revealed no evidence of recurrent or metastatic carcinoma.  Abdomen and pelvic CT with and without contrast on 09/13/2017 revealed no multiple simple renal cysts.  Liver was unremarkable.  CEA has been followed:  4.3 on 06/20/2013, 1.8 on 01/06/2014, 1.5 on 09/16/2014, 1.1 on 01/14/2015, 1.5 on 05/16/2015, 1.6 on 09/12/2015, 1.3 on 01/20/2016, 1.6 on 08/24/2016, 1.2 on 01/09/2017, and 1.1 on 09/27/2017.  Colonoscopy on 04/12/2014 revealed several polyps.  There was no dysplasia or malignancy.  Colonoscopy on 12/11/2016 revealed 10 polyps (2-8 mm) in the ascending proximal transverse, descending, proximal sigmoid and rectum.  Pathology revealed multiple tubular adenomas without dysplasia or malignancy.  He was diagnosed with pyelonephritis.  He is undergoing evaluation for hematuria.  CT scan was negative.  Cystoscopy on 10/03/2017 revealed prostate enlargement and no evidence of bladder pathology.  He has a mild  pancytopenia.  He received a course of Septra (08/26/2017 - 09/02/2017).  He has a history of a mild normocytic anemia.  Diet is modest.  He denies herbal products.  He denies any melena or hematochezia.  Work-up on 09/27/2017 revealed a hematocrit 31.1, hemoglobin 10.6, MCV 94, platelets 141,000, WBC 2700 with an ANC of 1600.  Creatinine was 1.32.  He has B12 deficiency.  B12 was low (149).  Anti-parietal cell antibody and intrinsic factor antibody were negative.   Negative studies included:  folate (12.3), TSH, hepatitis B core antibody total, and hepatitis C antibody. Retic was 1.8%.  Ferritin was 103 with a iron saturation of 25% and a TIBC of 201.   Creatinine was 1.32.  He has B12 deficiency.  B12 was 149 on 09/27/2017 then 322 on 11/04/2017 and 227 on 12/05/2017 on oral B12.  Echocardiogram revealed an EF was 20-25% on 02/18/2015.  Cardiac catheterization on 02/24/2015 revealed 80% occlusion of the proximal left circumflex.  He managed medically.  Symptomatically, he is doing well.  Exam is normal.  Plan: 1. Review labs from yesterday.  Counts stable. B12 remains low. 2. Stage IIIC colon cancer Doing well.  No symptoms. CEA stable at 1.1 on 09/27/2017. CT on 09/13/2017 revealed no evidence of malignancy. Colonoscopy in 12/2016 revealed multiple colonic polyps. Follow-up with gastroenterology Gustavo Lah, MD). 3. Pancytopenia WBC 3600 (ANC 2400).  Hemoglobin 11.2, hematocrit 33.4, MCV 92.5 and platelets 137,000. Etiology multifactorial and associated with anemia of chronic disease, hematuria, and vitamin deficiencies. Continue to monitor. 4. B12 deficiency B12 level remains low at 227 despite oral B12. Antiparietal and intrinsic factor antibodies normal - no pernicious anemia Discontinue oral B12. Begin B12 injections today and weekly x 6 then monthly.  5. RTC in 1 month for after last B12 weekly injection for MD assessment, labs (CBC with diff), and first monthly B12 injection.     Lequita Asal, MD  12/06/17, 4:35 PM

## 2017-12-06 NOTE — Progress Notes (Signed)
Patient offers no complaints today. 

## 2017-12-16 ENCOUNTER — Inpatient Hospital Stay: Payer: PPO

## 2017-12-16 DIAGNOSIS — E538 Deficiency of other specified B group vitamins: Secondary | ICD-10-CM

## 2017-12-16 MED ORDER — CYANOCOBALAMIN 1000 MCG/ML IJ SOLN
1000.0000 ug | Freq: Once | INTRAMUSCULAR | Status: AC
  Administered 2017-12-16: 1000 ug via INTRAMUSCULAR

## 2017-12-23 ENCOUNTER — Inpatient Hospital Stay: Payer: PPO

## 2017-12-23 DIAGNOSIS — E538 Deficiency of other specified B group vitamins: Secondary | ICD-10-CM | POA: Diagnosis not present

## 2017-12-23 MED ORDER — CYANOCOBALAMIN 1000 MCG/ML IJ SOLN
1000.0000 ug | Freq: Once | INTRAMUSCULAR | Status: AC
  Administered 2017-12-23: 1000 ug via INTRAMUSCULAR

## 2017-12-27 ENCOUNTER — Ambulatory Visit (INDEPENDENT_AMBULATORY_CARE_PROVIDER_SITE_OTHER): Payer: PPO | Admitting: Nurse Practitioner

## 2017-12-27 ENCOUNTER — Encounter: Payer: Self-pay | Admitting: Nurse Practitioner

## 2017-12-27 VITALS — BP 130/66 | HR 66 | Ht 69.0 in | Wt 184.8 lb

## 2017-12-27 DIAGNOSIS — I2510 Atherosclerotic heart disease of native coronary artery without angina pectoris: Secondary | ICD-10-CM | POA: Diagnosis not present

## 2017-12-27 DIAGNOSIS — I5042 Chronic combined systolic (congestive) and diastolic (congestive) heart failure: Secondary | ICD-10-CM

## 2017-12-27 DIAGNOSIS — I10 Essential (primary) hypertension: Secondary | ICD-10-CM | POA: Diagnosis not present

## 2017-12-27 DIAGNOSIS — E782 Mixed hyperlipidemia: Secondary | ICD-10-CM | POA: Diagnosis not present

## 2017-12-27 MED ORDER — LOSARTAN POTASSIUM 50 MG PO TABS: 50.0000 mg | ORAL_TABLET | Freq: Every day | ORAL | 3 refills | Status: DC

## 2017-12-27 NOTE — Patient Instructions (Signed)
Medication Instructions:  Your physician has recommended you make the following change in your medication:  1- INCREASE Losartan to 50 mg by mouth once a day.  If you need a refill on your cardiac medications before your next appointment, please call your pharmacy.   Lab work: Your physician recommends that you return for lab work in: [place] at your convenience.  - You will need to be FASTING. Nothing to eat or drink after midnight. - Please go to the [facility]. You will check in at the front desk to the right as you walk into the atrium. Valet Parking is offered if needed.   If you have labs (blood work) drawn today and your tests are completely normal, you will receive your results only by: Marland Kitchen MyChart Message (if you have MyChart) OR . A paper copy in the mail If you have any lab test that is abnormal or we need to change your treatment, we will call you to review the results.  Testing/Procedures: none  Follow-Up: At [facility], you and your health needs are our priority.  As part of our continuing mission to provide you with exceptional heart care, we have created designated Provider Care Teams.  These Care Teams include your primary Cardiologist (physician) and Advanced Practice Providers (APPs -  Physician Assistants and Nurse Practitioners) who all work together to provide you with the care you need, when you need it. You will need a follow up appointment in 6 months.  Please call our office 2 months in advance to schedule this appointment.  You may see Kathlyn Sacramento, MD or one of the following Advanced Practice Providers on your designated Care Team:   Murray Hodgkins, NP Christell Faith, PA-C . Marrianne Mood, PA-C

## 2017-12-27 NOTE — Progress Notes (Addendum)
Office Visit    Patient Name: [name]. Date of Encounter: 12/27/2017  Primary Care Provider:  Leone Haven, MD Primary Cardiologist:  Kathlyn Sacramento, MD  Chief Complaint    [name] is a 72 year old male with a a past history of CAD s/p stent to LCx, HFrEF, Colon Ca, HTN, HLD, mitral and tricuspid regurgitation. Presents today for follow up of HFrEF.   Past Medical History    Past Medical History:  Diagnosis Date  . Anemia   . CAD (coronary artery disease)    a. 02/2015 Cath: LM nl, LAD 50/40 mid/distal, LCX 80p, RCA 40p, 30d, RPL1 40, CO 3.27, CI 1.65; b. cath 06/09/15: LM minor irregs, m-dLAD 50%, dLAD 40%, pLCx 80% s/p PCI/DES 0%, pRCA 40%, dRCA 30%, 1st RPLB 40%  . Chronic systolic CHF (congestive heart failure) ([place])    a. 02/2015 Echo: EF 20-25%, diff HK, mod MR, mildy dil LA, mildly dil RV w/ mod RV syst dysfxn, mildly dil RA, mod TR, PASP 60mmHg; b. 09/2017 Echo: EF 40-45%, diff HK. Gr1 DD. Mild to mod MR. Mildly to mod dil LA.  . Colon cancer ([place])    a. 2015 s/p colectomy followed by chemoRx with oxaliplatin & fluorouacil.  . Diabetes mellitus without complication ([place])   . Hypertension   . Hypertensive heart disease   . Mixed Ischemic and Non-ischemic Cardiomyopathy    a. 02/2015 Echo: EF 20-25%, diff HK; b. 09/2016 Echo: EF 40-45%.  . Moderate mitral regurgitation    a. 09/2017 Echo: Mild to mod TR.  . Tubular adenoma of colon    Past Surgical History:  Procedure Laterality Date  . CARDIAC CATHETERIZATION Bilateral 02/24/2015   Procedure: Right/Left Heart Cath and Coronary Angiography;  Surgeon: Wellington Hampshire, MD;  Location: [place] [facility];  Service: Cardiovascular;  Laterality: Bilateral;  . CARDIAC CATHETERIZATION N/A 06/09/2015   Procedure: Coronary Stent Intervention;  Surgeon: Wellington Hampshire, MD;  Location: [place] [facility];  Service: Cardiovascular;  Laterality: N/A;  . COLON SURGERY     Colectomy  . COLONOSCOPY    . COLONOSCOPY  WITH PROPOFOL N/A 12/11/2016   Procedure: COLONOSCOPY WITH PROPOFOL;  Surgeon: Lollie Sails, MD;  Location: [facility];  Service: Endoscopy;  Laterality: N/A;  . ESOPHAGOGASTRODUODENOSCOPY    . PORT-A-CATH REMOVAL N/A 07/12/2014   Procedure: REMOVAL PORT-A-CATH;  Surgeon: Molly Maduro, MD;  Location: [facility];  Service: General;  Laterality: N/A;  . PORTACATH PLACEMENT Left     Allergies  Allergies  Allergen Reactions  . Shellfish Allergy Other (See Comments)    Pt. instructed by MD to avoid seafood    History of Present Illness    [name] is a 72 year old male with a a past history of CAD s/p stent to LCx, HFrEF, Colon Ca, HTN, HLD, and mitral and tricuspid regurgitation. Presents today for follow up of HFrEF. He was diagnosed with heart failure in January of 2017. EF at that time was found to be 20-25%, with moderate MR and TR, and mod-sev. Pulm. Hypertension. He then underwent R and LHC in January 2017 that showed pulmonary HTN, elevated filling pressures and decreased CO. LCx was also found to be 89-90% stenosis and mild difuse disease of the LAD and RCA. He was volume overloaded @ the time of cath and so decision was made to stage PCI following adequate diuresis.  In May of 2017 he underwent PCI with single DES placed  to the LCx and he was placed on DAPT,  blocker, Arlyce Harman, and ACEi.  Repeat echo in December 2017 showed slightly improved EF of 35-40%, mild MR and LAE. At follow up in June of 2018 he was doing well and his Plavix was stopped at that time. In July 2019 he was admitted to the hospital for ARF secondary to UTI and dehydration with Creat of 3.16 (had been 1.31 a year prior). He was treated with Abx and IV fluids, his lasix, aldactone, lisinopril, and amlodipine were stopped at that time. He was referred for continued follow up with nephrology and urology at discharge. Following this he was seen in our office and was doing well, he was started on Losartan at that time  and furosemide PRN. A repeat echo was ordered to assess LV function and possibility of switching ARB to Entresto if EF remained low. Echo in August of 2019 showed improved EF of 40-45%, G1 DD, mod. MR, and LAE.   At follow up today he is in his usoh. He denies chest pain, palpitations, dyspnea, pnd, orthopnea, n, v, dizziness, syncope, weight gain, or early satiety. He reports rarely using his PRN lasix, however he does not weight himself at home. His weight is up about 8 lbs since visit in July but he attributes this to an increased appetite. He tries to moderate his salt intake and reports rarely eating out. He does have trace edema in his ankles on exam. He states that this is normal when he's been standing for long periods of time, and that it resolves with elevation of his extremeties. He reports splitting wood prior to his appointment today and has been able to tolerate normal activity without complication.   Home Medications    Prior to Admission medications   Medication Sig Start Date End Date Taking? Authorizing Provider  aspirin EC 81 MG tablet Take 1 tablet (81 mg total) by mouth daily. 04/14/15   Theora Gianotti, NP  atorvastatin (LIPITOR) 40 MG tablet Take 1 tablet (40 mg total) by mouth daily. 08/30/17 12/06/17  Wellington Hampshire, MD  carvedilol (COREG) 3.125 MG tablet take 1 tablet by mouth twice a day 08/26/17   Wellington Hampshire, MD  gabapentin (NEURONTIN) 100 MG capsule Take 2 capsules (200 mg total) by mouth 3 (three) times daily. Patient taking differently: Take 100 mg by mouth 3 (three) times daily.  01/14/15   Lequita Asal, MD  losartan (COZAAR) 25 MG tablet Take 1 tablet (25 mg total) by mouth daily. 08/30/17 12/06/17  Wellington Hampshire, MD  tamsulosin (FLOMAX) 0.4 MG CAPS capsule Take 1 capsule (0.4 mg total) by mouth daily after supper. 08/18/17   McKenzie, Candee Furbish, MD    Review of Systems    He denies chest pain, palpitations, dyspnea, pnd, orthopnea, n, v,  dizziness, syncope, weight gain, or early satiety. Trace edema on BLE, reports dependent edema, resolves with elevation of LE.  All other systems reviewed and are otherwise negative except as noted above.  Physical Exam    VS:  BP 130/66   Pulse 66   Ht 5\' 9"  (1.753 m)   Wt 184 lb 12.8 oz (83.8 kg)   BMI 27.29 kg/m  , BMI Body mass index is 27.29 kg/m. GEN: Well nourished, well developed, in no acute distress. HEENT: normal. Neck: Supple, no JVD, carotid bruits, or masses. Cardiac: RRR, no murmurs, rubs, or gallops. No clubbing, cyanosis.Trace BLE edema.  Radials/PT 2+ and equal bilaterally.  Respiratory:  Respirations regular and unlabored, clear to auscultation bilaterally. GI: Soft, nontender, nondistended, BS + x 4. MS: no deformity or atrophy. Skin: warm and dry, no rash. Neuro:  Strength and sensation are intact. Psych: Normal affect.  Accessory Clinical Findings    ECG personally reviewed by me today - Sinus rhythm, 66, PAC's, w/ inferior and anterolateral t-wave abnormality - present intermittently on older ecg's   Assessment & Plan    1.  HfrEF/ICM: EF found to be reduced in January 2017. L and RHC revealed pulm. hypertension, decreased CO, and 80% LCx stenosis and non-obstructive dz in the LAD and RCA. Underwent PCI later that year with single DES placed in LCx. Medical management was recommended. Follow up Echo in December showed slight improvement of EF to 35-40%. Repeat echo in August of 2019 showed continued improvement of his EF, now 40-45%. Denies any recent cp, sob, doe, pnd or edema. Trace dependent edema present on exam. Patient reports this happens when on his feet most of the day and resolves with elevation. Was on ACEi and Arlyce Harman previously but these were discontinued in July 2019 related to AKI from UTI/dehydration. Will continue medical management including  blocker, ARB, and PRN Lasix. Titrating ARB today, f/u bmet in a week.  2. CAD: s/p LCx stent 2017.  Medically managed for non-obstructive disease of the LAD and RCA. He has been doing exceptionally well w/o cp, sob or associated s/s. Will continue ASA, statin,  blocker and ARB.   3. HTN: BP slightly elevated today, 130/66, CCB and ACEi stopped in July. Will increase Losartan to 50mg  daily. Will recheck CMP next week to check renal fxn and lytes.  4. HL: Stable on statin, last LDL was 64 in June 2018. Will recheck LFT's and Lipid panel next week with renal fxn check mentioned above.   5. Disposition: Ordered CMP and Lipid in 1 week to check renal and liver fxn, lytes/lipids. Will follow up in 6 months or sooner if needed.   Murray Hodgkins, NP 12/27/2017, 4:10 PM

## 2017-12-30 ENCOUNTER — Other Ambulatory Visit
Admission: RE | Admit: 2017-12-30 | Discharge: 2017-12-30 | Disposition: A | Discharge status: Home / Self Care | Payer: PPO | Source: Ambulatory Visit | Attending: Nurse Practitioner | Admitting: Nurse Practitioner

## 2017-12-30 ENCOUNTER — Telehealth: Payer: Self-pay | Admitting: Nurse Practitioner

## 2017-12-30 ENCOUNTER — Inpatient Hospital Stay: Payer: PPO

## 2017-12-30 DIAGNOSIS — I5042 Chronic combined systolic (congestive) and diastolic (congestive) heart failure: Secondary | ICD-10-CM | POA: Diagnosis not present

## 2017-12-30 DIAGNOSIS — E538 Deficiency of other specified B group vitamins: Secondary | ICD-10-CM

## 2017-12-30 DIAGNOSIS — E782 Mixed hyperlipidemia: Secondary | ICD-10-CM

## 2017-12-30 LAB — LIPID PANEL
CHOL/HDL RATIO: 2.2 ratio
Cholesterol: 110 mg/dL (ref 0–200)
HDL: 50 mg/dL (ref >40)
LDL Cholesterol: 53 mg/dL (ref 0–99)
TRIGLYCERIDES: 35 mg/dL (ref <150)
VLDL: 7 mg/dL (ref 0–40)

## 2017-12-30 LAB — COMPREHENSIVE METABOLIC PANEL
ALK PHOS: 78 U/L (ref 38–126)
ALT: 20 U/L (ref 0–44)
AST: 21 U/L (ref 15–41)
Albumin: 3.7 g/dL (ref 3.5–5.0)
Anion gap: 6 (ref 5–15)
BILIRUBIN TOTAL: 1.1 mg/dL (ref 0.3–1.2)
BUN: 16 mg/dL (ref 8–23)
CALCIUM: 8.9 mg/dL (ref 8.9–10.3)
CO2: 25 mmol/L (ref 22–32)
CREATININE: 1.37 mg/dL — AB (ref 0.61–1.24)
Chloride: 112 mmol/L — ABNORMAL HIGH (ref 98–111)
GFR, EST AFRICAN AMERICAN: 58 mL/min — AB (ref >60)
GFR, EST NON AFRICAN AMERICAN: 50 mL/min — AB (ref >60)
Glucose, Bld: 128 mg/dL — ABNORMAL HIGH (ref 70–99)
Potassium: 3.9 mmol/L (ref 3.5–5.1)
Sodium: 143 mmol/L (ref 135–145)
TOTAL PROTEIN: 6.8 g/dL (ref 6.5–8.1)

## 2017-12-30 MED ORDER — CYANOCOBALAMIN 1000 MCG/ML IJ SOLN
1000.0000 ug | Freq: Once | INTRAMUSCULAR | Status: AC
  Administered 2017-12-30: 1000 ug via INTRAMUSCULAR

## 2017-12-30 NOTE — Telephone Encounter (Signed)
I left a message for the patient to call for results.  

## 2017-12-30 NOTE — Telephone Encounter (Signed)
Notes recorded by Theora Gianotti, NP on 12/30/2017 at 3:13 PM EST Renal fxn relativley stable - pls encourage adequate oral hydration. K wnl. LFT's ok. Lipids @ goal. F/u bmet in 2 wks to ensure stability.

## 2018-01-01 ENCOUNTER — Ambulatory Visit (INDEPENDENT_AMBULATORY_CARE_PROVIDER_SITE_OTHER): Payer: PPO | Admitting: Family Medicine

## 2018-01-01 ENCOUNTER — Encounter: Payer: Self-pay | Admitting: Family Medicine

## 2018-01-01 VITALS — BP 150/80 | HR 57 | Temp 97.9°F | Ht 69.0 in | Wt 187.6 lb

## 2018-01-01 DIAGNOSIS — B351 Tinea unguium: Secondary | ICD-10-CM | POA: Diagnosis not present

## 2018-01-01 DIAGNOSIS — D649 Anemia, unspecified: Secondary | ICD-10-CM

## 2018-01-01 DIAGNOSIS — I10 Essential (primary) hypertension: Secondary | ICD-10-CM

## 2018-01-01 DIAGNOSIS — L84 Corns and callosities: Secondary | ICD-10-CM | POA: Diagnosis not present

## 2018-01-01 DIAGNOSIS — R0989 Other specified symptoms and signs involving the circulatory and respiratory systems: Secondary | ICD-10-CM | POA: Diagnosis not present

## 2018-01-01 DIAGNOSIS — S81802A Unspecified open wound, left lower leg, initial encounter: Secondary | ICD-10-CM | POA: Diagnosis not present

## 2018-01-01 DIAGNOSIS — E1159 Type 2 diabetes mellitus with other circulatory complications: Secondary | ICD-10-CM

## 2018-01-01 LAB — POCT GLYCOSYLATED HEMOGLOBIN (HGB A1C): HEMOGLOBIN A1C: 5.6 % (ref 4.0–5.6)

## 2018-01-01 NOTE — Patient Instructions (Addendum)
Nice to see you. We will get you to see a vascular doctor as well as a podiatrist.  Please keep an eye on the wound on your leg.  We will see you back in about 2 weeks to recheck it.

## 2018-01-01 NOTE — Progress Notes (Signed)
Christopher Rumps, MD Phone: [phone]  [name]. is a 72 y.o. male who presents today for f/u.  CC: Hypertension, CHF, pancytopenia, onychomycosis, left lower leg wound  Hypertension: Patient notes he is not checking his blood pressure at home.  He is taking losartan and carvedilol.  His cardiologist recently increased his losartan dose.  He notes no chest pain, shortness of breath, orthopnea, PND, or edema.  He saw cardiology late last week.  Pancytopenia: He followed up with oncology.  It appears they felt as though his pancytopenia was multifactorial possibly related to anemia of chronic disease, hematuria, vitamin deficiencies, and Bactrim.  They recommended follow-up labs 1 month after his last appointment.  He is also getting B12 injections from oncology.  Patient has left great toe onychomycosis.  He also notes he bumped his left lower leg several weeks ago.  He notes it has been healing well.  It has scabbed over.  No pain, drainage, fevers, or redness.  Previously noted to have diabetes with an A1c of 7.6 and elevated glucose.  Has not been on any medication.  Needs recheck of A1c.  Social History   Tobacco Use  Smoking Status Never Smoker  Smokeless Tobacco Never Used     ROS see history of present illness  Objective  Physical Exam Vitals:   01/01/18 1606 01/01/18 1635  BP: (!) 146/84 (!) 150/80  Pulse: (!) 57   Temp: 97.9 F (36.6 C)   SpO2: 98%     BP Readings from Last 3 Encounters:  01/01/18 (!) 150/80  12/27/17 130/66  12/06/17 (!) 166/90   Wt Readings from Last 3 Encounters:  01/01/18 187 lb 9.6 oz (85.1 kg)  12/27/17 184 lb 12.8 oz (83.8 kg)  12/06/17 188 lb 4 oz (85.4 kg)    Physical Exam  Constitutional: No distress.  Cardiovascular: Normal rate, regular rhythm and normal heart sounds.  Pulmonary/Chest: Effort normal and breath sounds normal.  Musculoskeletal: He exhibits no edema.  Neurological: He is alert.  Skin: Skin is warm and  dry. He is not diaphoretic.     Patient also has a small cut on the dorsum of his right second toe, callus on the tip of his right second toe, 2+ PT pulses, difficult to palpate DP pulses, good capillary refill  Patient additionally has hemosiderin deposition changes in his bilateral legs.   Assessment/Plan: Please see individual problem list.  Hypertension Blood pressure is elevated today.  Losartan dose was recently increased and he is only been taking this for several days.  We will have him return in 2 weeks for recheck with [place].  Diabetes ([place]) Previously elevated.  Patient is due for recheck of A1c which is now in the normal range.  I wonder if his blood sugar to some degree was elevated in the setting of his infection leading to an elevated A1c.  We will not start any medications.  We will plan to recheck in 3 months.  Anemia Patient has been evaluated by hematology for this.  Likely multifactorial.  I discussed that he would need to have this rechecked through them in the next several weeks.  Leg wound, left, initial encounter Appears to be healing with overlying scab formation.  No signs of infection.  We will plan to recheck in 2 weeks.  If not continuing to heal could consider wound care referral.  Given return precautions.  Onychomycosis Refer to podiatry for this and the callus on his toe.  Decreased pedal pulses  Patient certainly does have risk factors for arterial disease.  He also appears to have venous insufficiency changes.  We will refer to vascular surgery.    Orders Placed This Encounter  Procedures  . Ambulatory referral to Vascular Surgery    Referral Priority:   Routine    Referral Type:   Surgical    Referral Reason:   Specialty Services Required    Requested Specialty:   Vascular Surgery    Number of Visits Requested:   1  . Ambulatory referral to Podiatry    Referral Priority:   Routine    Referral Type:   Consultation    Referral Reason:   Specialty  Services Required    Requested Specialty:   Podiatry    Number of Visits Requested:   1  . POCT HgB A1C    No orders of the defined types were placed in this encounter.    Christopher Rumps, MD [place]

## 2018-01-02 ENCOUNTER — Encounter: Payer: Self-pay | Admitting: Family Medicine

## 2018-01-02 DIAGNOSIS — R0989 Other specified symptoms and signs involving the circulatory and respiratory systems: Secondary | ICD-10-CM | POA: Insufficient documentation

## 2018-01-02 DIAGNOSIS — S81802A Unspecified open wound, left lower leg, initial encounter: Secondary | ICD-10-CM | POA: Insufficient documentation

## 2018-01-02 DIAGNOSIS — B351 Tinea unguium: Secondary | ICD-10-CM | POA: Insufficient documentation

## 2018-01-02 NOTE — Assessment & Plan Note (Signed)
Appears to be healing with overlying scab formation.  No signs of infection.  We will plan to recheck in 2 weeks.  If not continuing to heal could consider wound care referral.  Given return precautions.

## 2018-01-02 NOTE — Assessment & Plan Note (Signed)
Patient has been evaluated by hematology for this.  Likely multifactorial.  I discussed that he would need to have this rechecked through them in the next several weeks.

## 2018-01-02 NOTE — Assessment & Plan Note (Signed)
Patient certainly does have risk factors for arterial disease.  He also appears to have venous insufficiency changes.  We will refer to vascular surgery.

## 2018-01-02 NOTE — Assessment & Plan Note (Signed)
Refer to podiatry for this and the callus on his toe.

## 2018-01-02 NOTE — Assessment & Plan Note (Signed)
Blood pressure is elevated today.  Losartan dose was recently increased and he is only been taking this for several days.  We will have him return in 2 weeks for recheck with [place].

## 2018-01-02 NOTE — Assessment & Plan Note (Signed)
Previously elevated.  Patient is due for recheck of A1c which is now in the normal range.  I wonder if his blood sugar to some degree was elevated in the setting of his infection leading to an elevated A1c.  We will not start any medications.  We will plan to recheck in 3 months.

## 2018-01-06 ENCOUNTER — Inpatient Hospital Stay: Payer: PPO | Attending: Hematology and Oncology

## 2018-01-06 DIAGNOSIS — E538 Deficiency of other specified B group vitamins: Secondary | ICD-10-CM | POA: Insufficient documentation

## 2018-01-06 MED ORDER — CYANOCOBALAMIN 1000 MCG/ML IJ SOLN
1000.0000 ug | Freq: Once | INTRAMUSCULAR | Status: AC
  Administered 2018-01-06: 1000 ug via INTRAMUSCULAR

## 2018-01-06 NOTE — Telephone Encounter (Signed)
attempted call x 3.  Pt unavailable to take call.

## 2018-01-07 ENCOUNTER — Other Ambulatory Visit (INDEPENDENT_AMBULATORY_CARE_PROVIDER_SITE_OTHER): Payer: Self-pay | Admitting: Vascular Surgery

## 2018-01-07 DIAGNOSIS — R0989 Other specified symptoms and signs involving the circulatory and respiratory systems: Secondary | ICD-10-CM

## 2018-01-08 NOTE — Telephone Encounter (Signed)
I left a message for the patient to call back for results.  

## 2018-01-09 ENCOUNTER — Ambulatory Visit (INDEPENDENT_AMBULATORY_CARE_PROVIDER_SITE_OTHER): Payer: PPO | Admitting: Nurse Practitioner

## 2018-01-09 ENCOUNTER — Ambulatory Visit (INDEPENDENT_AMBULATORY_CARE_PROVIDER_SITE_OTHER): Payer: PPO

## 2018-01-09 ENCOUNTER — Encounter (INDEPENDENT_AMBULATORY_CARE_PROVIDER_SITE_OTHER): Payer: Self-pay | Admitting: Nurse Practitioner

## 2018-01-09 VITALS — BP 137/80 | HR 59 | Resp 16 | Ht 69.0 in | Wt 184.8 lb

## 2018-01-09 DIAGNOSIS — E119 Type 2 diabetes mellitus without complications: Secondary | ICD-10-CM | POA: Diagnosis not present

## 2018-01-09 DIAGNOSIS — I10 Essential (primary) hypertension: Secondary | ICD-10-CM

## 2018-01-09 DIAGNOSIS — R0989 Other specified symptoms and signs involving the circulatory and respiratory systems: Secondary | ICD-10-CM

## 2018-01-09 NOTE — Progress Notes (Signed)
Subjective:    Patient ID: [name]., male    DOB: [birth year], 72 y.o.   MRN: [id number] Chief Complaint  Patient presents with  . New Patient (Initial Visit)    ref Caryl Bis for decreased pulses    HPI  [name]. is a 72 y.o. male that was referred by his primary care physician, Dr.Sonnenburg due to concern from decreased pedal pulses.  Patient has a previous history of coronary artery disease, colon cancer, and diabetes.  The patient states that he does not have any pain or issues with walking in his bilateral lower extremities.  He denies any claudication-like symptoms or rest pain.  He states that he hit his leg on a bed approximately 2 weeks ago, however the wound has been healing since then with no issues.  He endorses having some swelling in his bilateral lower extremities however he states that it is minimal and it does not cause him any issues.  He denies any chest pain or shortness of breath.  He denies any fever, chills, nausea, vomiting or diarrhea.  He underwent bilateral ABIs today which revealed an ABI of 1.00 bilaterally.  He had triphasic waveforms in the tibial arteries of the left lower extremity with strong toe waveforms.  Past Medical History:  Diagnosis Date  . Acute renal failure (ARF) ([place]) 08/14/2017  . Anemia   . CAD (coronary artery disease)    a. 02/2015 Cath: LM nl, LAD 50/40 mid/distal, LCX 80p, RCA 40p, 30d, RPL1 40, CO 3.27, CI 1.65; b. cath 06/09/15: LM minor irregs, m-dLAD 50%, dLAD 40%, pLCx 80% s/p PCI/DES 0%, pRCA 40%, dRCA 30%, 1st RPLB 40%  . Chronic systolic CHF (congestive heart failure) ([place])    a. 02/2015 Echo: EF 20-25%, diff HK, mod MR, mildy dil LA, mildly dil RV w/ mod RV syst dysfxn, mildly dil RA, mod TR, PASP 39mmHg; b. 09/2017 Echo: EF 40-45%, diff HK. Gr1 DD. Mild to mod MR. Mildly to mod dil LA.  . Colon cancer ([place])    a. 2015 s/p colectomy followed by chemoRx with oxaliplatin & fluorouacil.  . Dia hypertension 07/20/2014    . Diabetes mellitus without complication ([place])   . Hypertension   . Hypertensive heart disease   . Mixed Ischemic and Non-ischemic Cardiomyopathy    a. 02/2015 Echo: EF 20-25%, diff HK; b. 09/2016 Echo: EF 40-45%.  . Moderate mitral regurgitation    a. 09/2017 Echo: Mild to mod TR.  . Tubular adenoma of colon     Past Surgical History:  Procedure Laterality Date  . CARDIAC CATHETERIZATION Bilateral 02/24/2015   Procedure: Right/Left Heart Cath and Coronary Angiography;  Surgeon: Wellington Hampshire, MD;  Location: [facility];  Service: Cardiovascular;  Laterality: Bilateral;  . CARDIAC CATHETERIZATION N/A 06/09/2015   Procedure: Coronary Stent Intervention;  Surgeon: Wellington Hampshire, MD;  Location: [place];  Service: Cardiovascular;  Laterality: N/A;  . COLON SURGERY     Colectomy  . COLONOSCOPY    . COLONOSCOPY WITH PROPOFOL N/A 12/11/2016   Procedure: COLONOSCOPY WITH PROPOFOL;  Surgeon: Lollie Sails, MD;  Location[facility];  Service: Endoscopy;  Laterality: N/A;  . ESOPHAGOGASTRODUODENOSCOPY    . PORT-A-CATH REMOVAL N/A 07/12/2014   Procedure: REMOVAL PORT-A-CATH;  Surgeon: Molly Maduro, MD;  Location: [facility] OR[facility];  Service: General;  Laterality: N/A;  . PORTACATH PLACEMENT Left     Social History   Socioeconomic History  . Marital status: Married  Spouse name: Not on file  . Number of children: Not on file  . Years of education: Not on file  . Highest education level: Not on file  Occupational History  . Not on file  Social Needs  . Financial resource strain: Not on file  . Food insecurity:    Worry: Not on file    Inability: Not on file  . Transportation needs:    Medical: Not on file    Non-medical: Not on file  Tobacco Use  . Smoking status: Never Smoker  . Smokeless tobacco: Never Used  Substance and Sexual Activity  . Alcohol use: No  . Drug use: No  . Sexual activity: Yes  Lifestyle  . Physical activity:    Days per week:  Not on file    Minutes per session: Not on file  . Stress: Not on file  Relationships  . Social connections:    Talks on phone: Not on file    Gets together: Not on file    Attends religious service: Not on file    Active member of club or organization: Not on file    Attends meetings of clubs or organizations: Not on file    Relationship status: Not on file  . Intimate partner violence:    Fear of current or ex partner: Not on file    Emotionally abused: Not on file    Physically abused: Not on file    Forced sexual activity: Not on file  Other Topics Concern  . Not on file  Social History Narrative  . Not on file    History reviewed. No pertinent family history.  Allergies  Allergen Reactions  . Shellfish Allergy Other (See Comments)    Pt. instructed by MD to avoid seafood     Review of Systems   Review of Systems: Negative Unless Checked Constitutional: [] Weight loss  [] Fever  [] Chills Cardiac: [] Chest pain   []  Atrial Fibrillation  [] Palpitations   [] Shortness of breath when laying flat   [] Shortness of breath with exertion. Vascular:  [] Pain in legs with walking   [] Pain in legs with standing  [] History of DVT   [] Phlebitis   [x] Swelling in legs   [] Varicose veins   [] Non-healing ulcers Pulmonary:   [] Uses home oxygen   [] Productive cough   [] Hemoptysis   [] Wheeze  [] COPD   [] Asthma Neurologic:  [] Dizziness   [] Seizures   [] History of stroke   [] History of TIA  [] Aphasia   [] Vissual changes   [] Weakness or numbness in arm   [] Weakness or numbness in leg Musculoskeletal:   [] Joint swelling   [] Joint pain   [] Low back pain  []  History of Knee Replacement Hematologic:  [] Easy bruising  [] Easy bleeding   [] Hypercoagulable state   [x] Anemic Gastrointestinal:  [] Diarrhea   [] Vomiting  [] Gastroesophageal reflux/heartburn   [] Difficulty swallowing. Genitourinary:  [] Chronic kidney disease   [] Difficult urination  [] Anuric   [] Blood in urine Skin:  [] Rashes   [] Ulcers    Psychological:  [] History of anxiety   []  History of major depression  []  Memory Difficulties     Objective:   Physical Exam  BP 137/80 (BP Location: Right Arm)   Pulse (!) 59   Resp 16   Ht 5\' 9"  (1.753 m)   Wt 184 lb 12.8 oz (83.8 kg)   BMI 27.29 kg/m   Gen: WD/WN, NAD Head: [place]/AT, No temporalis wasting.  Ear/Nose/Throat: Hearing grossly intact, nares w/o erythema or drainage Eyes: PER, EOMI,  sclera nonicteric.  Neck: Supple, no masses.  No JVD.  Pulmonary:  Good air movement, no use of accessory muscles.  Cardiac: RRR Vascular:  Minimal 1+ soft edema.  Left lower extremity has large scabbed over area. Vessel Right Left  Radial Palpable Palpable  Dorsalis Pedis  trace palpable  trace palpable  Posterior Tibial Palpable Palpable   Gastrointestinal: soft, non-distended. No guarding/no peritoneal signs.  Musculoskeletal: M/S 5/5 throughout.  No deformity or atrophy.  Neurologic: Pain and light touch intact in extremities.  Symmetrical.  Speech is fluent. Motor exam as listed above. Psychiatric: Judgment intact, Mood & affect appropriate for pt's clinical situation. Dermatologic: No Venous rashes. No Ulcers Noted.  No changes consistent with cellulitis. Lymph : No Cervical lymphadenopathy, no lichenification or skin changes of chronic lymphedema.      Assessment & Plan:  1. Essential hypertension Continue antihypertensive medications as already ordered, these medications have been reviewed and there are no changes at this time.   2. Decreased pedal pulses Recommend:  I do not find evidence of life style limiting vascular disease. The patient specifically denies life style limitation.  Previous noninvasive studies including ABI's of the legs do not identify critical vascular problems.  The patient should continue walking and begin a more formal exercise program. The patient should continue his antiplatelet therapy and aggressive treatment of the lipid  abnormalities.  The patient should begin wearing graduated compression socks 15-20 mmHg strength to control his mild edema.  Patient will follow-up with me on a PRN basis   3. Type 2 diabetes mellitus without complication, without long-term current use of insulin (HCC) Continue hypoglycemic medications as already ordered, these medications have been reviewed and there are no changes at this time.  Hgb A1C to be monitored as already arranged by primary service      Current Outpatient Medications on File Prior to Visit  Medication Sig Dispense Refill  . aspirin EC 81 MG tablet Take 1 tablet (81 mg total) by mouth daily. 90 tablet 3  . atorvastatin (LIPITOR) 40 MG tablet Take 1 tablet (40 mg total) by mouth daily. 90 tablet 1  . carvedilol (COREG) 3.125 MG tablet take 1 tablet by mouth twice a day 180 tablet 2  . gabapentin (NEURONTIN) 100 MG capsule Take 2 capsules (200 mg total) by mouth 3 (three) times daily. 180 capsule 0  . losartan (COZAAR) 50 MG tablet Take 1 tablet (50 mg total) by mouth daily. 90 tablet 3  . tamsulosin (FLOMAX) 0.4 MG CAPS capsule Take 1 capsule (0.4 mg total) by mouth daily after supper. 30 capsule 11   No current facility-administered medications on file prior to visit.     There are no Patient Instructions on file for this visit. Return if symptoms worsen or fail to improve.   Kris Hartmann, NP  This note was completed with Sales executive.  Any errors are purely unintentional.

## 2018-01-13 ENCOUNTER — Inpatient Hospital Stay: Payer: PPO

## 2018-01-13 DIAGNOSIS — E538 Deficiency of other specified B group vitamins: Secondary | ICD-10-CM

## 2018-01-13 MED ORDER — CYANOCOBALAMIN 1000 MCG/ML IJ SOLN
1000.0000 ug | Freq: Once | INTRAMUSCULAR | Status: AC
  Administered 2018-01-13: 1000 ug via INTRAMUSCULAR

## 2018-01-15 ENCOUNTER — Telehealth: Payer: Self-pay

## 2018-01-15 NOTE — Telephone Encounter (Signed)
4 th attempt made to reach patient about results and need for blood redraw. Pt unavailable.   Phone encounter closed

## 2018-01-21 ENCOUNTER — Telehealth: Payer: Self-pay | Admitting: *Deleted

## 2018-01-21 NOTE — Telephone Encounter (Deleted)
Tried calling patient several times over the past 2 weeks in regards to his 02/10/2018 appts. To see if he wanted to transfer to the [facility] Location with Dr.Corcoran or to be scheduled with another MD here in [place]. A detailed message was left on patient's vmail I will continue to try calling him about this matter..  (Patient was moved to Dr. Tasia Catchings scheduled)

## 2018-01-21 NOTE — Telephone Encounter (Signed)
Tried calling patient several times over the past 3 weeks in regards to his 02/10/2018 appts. To see if he wanted to transfer to the [facility] Location with Dr.Corcoran or to be scheduled with another MD here in [place]. A detailed message was left on patient's vmail I will continue to try calling him about this matter..  (Patient was moved to Dr. Tasia Catchings scheduled)

## 2018-01-22 ENCOUNTER — Encounter: Payer: Self-pay | Admitting: Family Medicine

## 2018-01-22 ENCOUNTER — Ambulatory Visit (INDEPENDENT_AMBULATORY_CARE_PROVIDER_SITE_OTHER): Payer: PPO | Admitting: Family Medicine

## 2018-01-22 DIAGNOSIS — S81802A Unspecified open wound, left lower leg, initial encounter: Secondary | ICD-10-CM

## 2018-01-22 DIAGNOSIS — T148XXA Other injury of unspecified body region, initial encounter: Secondary | ICD-10-CM | POA: Diagnosis not present

## 2018-01-22 MED ORDER — CEPHALEXIN 500 MG PO CAPS: 500.0000 mg | ORAL_CAPSULE | Freq: Two times a day (BID) | ORAL | 0 refills | Status: DC

## 2018-01-22 NOTE — Patient Instructions (Signed)
Nice to see you. We will start you on Keflex. Please monitor the area on your leg. If the redness spreads or you develop drainage or fever please be evaluated immediately.

## 2018-01-22 NOTE — Progress Notes (Signed)
Pre visit review using our clinic review tool, if applicable. No additional management support is needed unless otherwise documented below in the visit note. 

## 2018-01-23 DIAGNOSIS — T148XXA Other injury of unspecified body region, initial encounter: Secondary | ICD-10-CM | POA: Insufficient documentation

## 2018-01-23 NOTE — Assessment & Plan Note (Signed)
This is possibly related to a heat based injury given that it seems to have occurred after standing close to a heater.  He did not come in contact with this directly.  There is not any purulent drainage.  There is an area of erythema that could be related to the heat based injury though we will cover for cellulitic infection with Keflex.  This was renally dosed.  He will cleanse with soap and water.  We will recheck him in 2 days.  Return precautions in AVS.

## 2018-01-23 NOTE — Assessment & Plan Note (Signed)
Appears to be healing well.  The scab continues to become smaller in size.  There is no sign of infection at this site.  He will continue to monitor and if it does not continue to heal he will let us know.

## 2018-01-23 NOTE — Progress Notes (Signed)
  Tommi Rumps, MD Phone: [phone]  [name]. is a 72 y.o. male who presents today for follow-up.  CC: Left leg wound  Patient notes this appears to continue to be healing.  He did see vascular surgery given his difficult to palpate pedal pulses and he had ABIs completed that did not reveal any significant arterial disease.  They recommended walking and treatment of underlying risk factors.  Follow-up is supposed to be on a as needed basis.  The patient notes the scab has been getting smaller.  Overall it is healing well.  He does note 2 blisters that came up after standing close to a heater several days ago.  He did not come in contact with the heater.  He notes he tried to drain them yesterday.  He notes they drained clear fluid.  1 of them still has fluid in it.  He has not noted any significant redness, fevers, tenderness, or pain.  Social History   Tobacco Use  Smoking Status Never Smoker  Smokeless Tobacco Never Used     ROS see history of present illness  Objective  Physical Exam Vitals:   01/22/18 1647  BP: 124/76  Pulse: 68  Temp: 98 F (36.7 C)  SpO2: 98%    BP Readings from Last 3 Encounters:  01/22/18 124/76  01/09/18 137/80  01/01/18 (!) 150/80   Wt Readings from Last 3 Encounters:  01/22/18 187 lb (84.8 kg)  01/09/18 184 lb 12.8 oz (83.8 kg)  01/01/18 187 lb 9.6 oz (85.1 kg)    Physical Exam Constitutional:      General: He is not in acute distress.    Appearance: Normal appearance.  Skin:      Neurological:     Mental Status: He is alert.   About 3 to 4 cm inferior to the blister outlined above there is an empty blister with no drainage, there is a slight erythema over the skin inferior to the blister outlined in the diagram above, there is no induration, warmth, or tenderness of this   Assessment/Plan: Please see individual problem list.  Leg wound, left, initial encounter Appears to be healing well.  The scab continues to  become smaller in size.  There is no sign of infection at this site.  He will continue to monitor and if it does not continue to heal he will let us know.  Blistering of skin This is possibly related to a heat based injury given that it seems to have occurred after standing close to a heater.  He did not come in contact with this directly.  There is not any purulent drainage.  There is an area of erythema that could be related to the heat based injury though we will cover for cellulitic infection with Keflex.  This was renally dosed.  He will cleanse with soap and water.  We will recheck him in 2 days.  Return precautions in AVS.   No orders of the defined types were placed in this encounter.   Meds ordered this encounter  Medications  . cephALEXin (KEFLEX) 500 MG capsule    Sig: Take 1 capsule (500 mg total) by mouth 2 (two) times daily.    Dispense:  14 capsule    Refill:  0     Tommi Rumps, MD [place]

## 2018-01-24 ENCOUNTER — Encounter: Payer: Self-pay | Admitting: Family Medicine

## 2018-01-24 ENCOUNTER — Ambulatory Visit (INDEPENDENT_AMBULATORY_CARE_PROVIDER_SITE_OTHER): Payer: PPO | Admitting: Family Medicine

## 2018-01-24 DIAGNOSIS — T148XXA Other injury of unspecified body region, initial encounter: Secondary | ICD-10-CM

## 2018-01-24 NOTE — Assessment & Plan Note (Signed)
Blisters cleansed with alcohol swab.  Serosanguineous fluid expressed from the superior blister.  No additional fluid within the superior blister.  The roof of the blister was left intact.  I believe the area of erythema is related to a heat based injury given lack of induration, warmth, and tenderness.  We briefly discussed deroofing the blisters though given that the area appears to be related to a burn we left them intact.  We will cover with Keflex and he will complete his course of Keflex to provide antibacterial coverage.  We will have him rechecked on Monday afternoon to evaluate for any progression.  If he has any progressing erythema or develops any new symptoms he will be evaluated at urgent care.  He is given return precautions.

## 2018-01-24 NOTE — Patient Instructions (Signed)
Nice to see you. Your blisters appear to be doing well.  The redness may be related to the burn.  You should complete the Keflex. We will have you return on Monday or Tuesday for recheck.

## 2018-01-24 NOTE — Progress Notes (Signed)
  Tommi Rumps, MD Phone: [phone]  [name]. is a 72 y.o. male who presents today for follow-up.  CC: Leg blister  Patient was seen 2 days ago for a heat injury to his left lower leg with blister formation and mild erythema.  There was no tenderness, warmth, or induration.  We started him on Keflex to cover for possible infection.  He notes the erythema has seemed to improve and has not worsened.  There is no pain.  He has had no fevers.  He has had no drainage.  Social History   Tobacco Use  Smoking Status Never Smoker  Smokeless Tobacco Never Used     ROS see history of present illness  Objective  Physical Exam Vitals:   01/24/18 1623  BP: (!) 102/58  Pulse: (!) 57  Temp: 98.3 F (36.8 C)  SpO2: 97%    BP Readings from Last 3 Encounters:  01/24/18 (!) 102/58  01/22/18 124/76  01/09/18 137/80   Wt Readings from Last 3 Encounters:  01/24/18 187 lb 6.4 oz (85 kg)  01/22/18 187 lb (84.8 kg)  01/09/18 184 lb 12.8 oz (83.8 kg)    Physical Exam Skin:      Neurological:     Mental Status: He is alert.   There is no induration, warmth, or tenderness to the area of erythema   Assessment/Plan: Please see individual problem list.  Blistering of skin Blisters cleansed with alcohol swab.  Serosanguineous fluid expressed from the superior blister.  No additional fluid within the superior blister.  The roof of the blister was left intact.  I believe the area of erythema is related to a heat based injury given lack of induration, warmth, and tenderness.  We briefly discussed deroofing the blisters though given that the area appears to be related to a burn we left them intact.  We will cover with Keflex and he will complete his course of Keflex to provide antibacterial coverage.  We will have him rechecked on Monday afternoon to evaluate for any progression.  If he has any progressing erythema or develops any new symptoms he will be evaluated at urgent care.  He  is given return precautions.   No orders of the defined types were placed in this encounter.   No orders of the defined types were placed in this encounter.    Tommi Rumps, MD [place]

## 2018-01-27 ENCOUNTER — Ambulatory Visit (INDEPENDENT_AMBULATORY_CARE_PROVIDER_SITE_OTHER): Payer: PPO | Admitting: Family Medicine

## 2018-01-27 ENCOUNTER — Encounter: Payer: Self-pay | Admitting: Family Medicine

## 2018-01-27 VITALS — BP 140/70 | HR 77 | Temp 98.7°F | Ht 69.0 in | Wt 188.2 lb

## 2018-01-27 DIAGNOSIS — S81802D Unspecified open wound, left lower leg, subsequent encounter: Secondary | ICD-10-CM

## 2018-01-27 DIAGNOSIS — T148XXA Other injury of unspecified body region, initial encounter: Secondary | ICD-10-CM

## 2018-01-27 NOTE — Patient Instructions (Signed)
Skin looks great!  Finish keflex course as prescribed
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
## 2018-02-06 ENCOUNTER — Other Ambulatory Visit: Payer: Self-pay | Admitting: Urgent Care

## 2018-02-07 ENCOUNTER — Other Ambulatory Visit: Payer: Self-pay | Admitting: Hematology and Oncology

## 2018-02-10 ENCOUNTER — Inpatient Hospital Stay: Payer: PPO

## 2018-02-10 ENCOUNTER — Other Ambulatory Visit: Payer: Self-pay

## 2018-02-10 ENCOUNTER — Encounter: Payer: Self-pay | Admitting: Oncology

## 2018-02-10 ENCOUNTER — Inpatient Hospital Stay: Payer: PPO | Attending: Oncology

## 2018-02-10 ENCOUNTER — Inpatient Hospital Stay (HOSPITAL_BASED_OUTPATIENT_CLINIC_OR_DEPARTMENT_OTHER): Payer: PPO | Admitting: Oncology

## 2018-02-10 VITALS — BP 131/75 | HR 63 | Temp 96.6°F | Resp 18 | Wt 190.8 lb

## 2018-02-10 DIAGNOSIS — E538 Deficiency of other specified B group vitamins: Secondary | ICD-10-CM

## 2018-02-10 DIAGNOSIS — C187 Malignant neoplasm of sigmoid colon: Secondary | ICD-10-CM | POA: Insufficient documentation

## 2018-02-10 DIAGNOSIS — D61818 Other pancytopenia: Secondary | ICD-10-CM | POA: Diagnosis not present

## 2018-02-10 DIAGNOSIS — D696 Thrombocytopenia, unspecified: Secondary | ICD-10-CM | POA: Insufficient documentation

## 2018-02-10 DIAGNOSIS — D649 Anemia, unspecified: Secondary | ICD-10-CM

## 2018-02-10 LAB — CBC WITH DIFFERENTIAL/PLATELET
Abs Immature Granulocytes: 0.01 10*3/uL (ref 0.00–0.07)
Basophils Absolute: 0 10*3/uL (ref 0.0–0.1)
Basophils Relative: 1 %
Eosinophils Absolute: 0.1 10*3/uL (ref 0.0–0.5)
Eosinophils Relative: 2 %
HCT: 32 % — ABNORMAL LOW (ref 39.0–52.0)
Hemoglobin: 10.2 g/dL — ABNORMAL LOW (ref 13.0–17.0)
Immature Granulocytes: 0 %
Lymphocytes Relative: 15 %
Lymphs Abs: 0.5 10*3/uL — ABNORMAL LOW (ref 0.7–4.0)
MCH: 30 pg (ref 26.0–34.0)
MCHC: 31.9 g/dL (ref 30.0–36.0)
MCV: 94.1 fL (ref 80.0–100.0)
Monocytes Absolute: 0.4 10*3/uL (ref 0.1–1.0)
Monocytes Relative: 13 %
Neutro Abs: 2.1 10*3/uL (ref 1.7–7.7)
Neutrophils Relative %: 69 %
Platelets: 119 10*3/uL — ABNORMAL LOW (ref 150–400)
RBC: 3.4 MIL/uL — ABNORMAL LOW (ref 4.22–5.81)
RDW: 15.9 % — ABNORMAL HIGH (ref 11.5–15.5)
WBC: 3.1 10*3/uL — ABNORMAL LOW (ref 4.0–10.5)
nRBC: 0 % (ref 0.0–0.2)

## 2018-02-10 LAB — RETIC PANEL
IMMATURE RETIC FRACT: 10.6 % (ref 2.3–15.9)
RBC.: 3.4 MIL/uL — ABNORMAL LOW (ref 4.22–5.81)
RETIC COUNT ABSOLUTE: 73.4 10*3/uL (ref 19.0–186.0)
RETICULOCYTE HEMOGLOBIN: 32.8 pg (ref >27.9)
Retic Ct Pct: 2.2 % (ref 0.4–3.1)

## 2018-02-10 MED ORDER — CYANOCOBALAMIN 1000 MCG/ML IJ SOLN
1000.0000 ug | Freq: Once | INTRAMUSCULAR | Status: AC
  Administered 2018-02-10: 1000 ug via INTRAMUSCULAR
  Filled 2018-02-10: qty 1

## 2018-02-10 NOTE — Progress Notes (Signed)
[place]  Chief Complaint: Christopher Amos. is a 73 y.o. male with a history of stage IIIC colon cancer, pancytopenia, and B12 deficiency who presented to follow-up for management of history of colon cancer and vitamin B12 deficiency.   PERTINENT ONCOLOGY HISTORY Christopher Ringel. is a 73 y.o.[place] who has above oncology history reviewed by me today presented for follow up visit for management of stage IIIC colon cancer.  He presented with symptomatic anemia.  He underwent descending and proximal sigmoid colectomy, left ureteral lysis, and partial cecectomy on 06/23/2013. Six of 22 lymph nodes were positive.  There were tumor deposits (discontinuous extramural extension).  There was lymphovascular and perineural invasion. Pathologic stage was IIIC (T4bN2a M0).  He received FOLFOX chemotherapy from 07/22/2013 until 01/06/2014.    Chest, abdomen, and pelvic CT on 01/12/2016 revealed no evidence of recurrent or metastatic carcinoma.  Abdomen and pelvic CT with and without contrast on 09/13/2017 revealed no multiple simple renal cysts.  Liver was unremarkable.  Colonoscopy on 04/12/2014 revealed several polyps.  There was no dysplasia or malignancy.  Colonoscopy on 12/11/2016 revealed 10 polyps (2-8 mm) in the ascending proximal transverse, descending, proximal sigmoid and rectum.  Pathology revealed multiple tubular adenomas without dysplasia or malignancy.  He was diagnosed with pyelonephritis.  He is undergoing evaluation for hematuria.  CT scan was negative.  Cystoscopy on 10/03/2017 revealed prostate enlargement and no evidence of bladder pathology.  He has a mild pancytopenia.  He received a course of Septra (08/26/2017 - 09/02/2017).  He has a history of a mild normocytic anemia.  Diet is modest.  He denies herbal products.  He denies any melena or hematochezia.  Work-up on 09/27/2017 revealed a hematocrit 31.1, hemoglobin 10.6, MCV 94, platelets 141,000,  WBC 2700 with an ANC of 1600.  Creatinine was 1.32.  He has B12 deficiency.  B12 was low (149).  Anti-parietal cell antibody and intrinsic factor antibody were negative.   Negative studies included:  folate (12.3), TSH, hepatitis B core antibody total, and hepatitis C antibody. Retic was 1.8%.  Ferritin was 103 with a iron saturation of 25% and a TIBC of 201.   Creatinine was 1.32.  He has B12 deficiency.  B12 was 149 on 09/27/2017 then 322 on 11/04/2017 and 227 on 12/05/2017 on oral B12.  Echocardiogram revealed an EF was 20-25% on 02/18/2015.  Cardiac catheterization on 02/24/2015 revealed 80% occlusion of the proximal left circumflex.  He managed medically.   INTERVAL HISTORY Christopher Furney. is a 73 y.o. male who has above history reviewed by me today presents for follow up visit for management of history of colon cancer and vitamin B12 deficiency Problems and complaints are listed below:   #Patient previous oncology care was with Dr. Mike Gip who has moved practice to [facility]. Patient switches care to me.  Patient was last seen at the cancer center on 12/06/2017.  Vitamin B12 deficiency status post parenteral B12 injections, finished daily vitamin B12 injection followed by weekly injections.  Currently on monthly B12 injection.  Reports feeling well.  He voices no concerns.  No new complaints.  Appetite is good.  Denies any shortness of breath, chest pain, abdominal pain, diarrhea, blood in the stool.  Past Medical History:  Diagnosis Date  . Acute renal failure (ARF) ([place]) 08/14/2017  . Anemia   . CAD (coronary artery disease)    a. 02/2015 Cath: LM nl, LAD 50/40 mid/distal, LCX 80p, RCA 40p,  30d, RPL1 40, CO 3.27, CI 1.65; b. cath 06/09/15: LM minor irregs, m-dLAD 50%, dLAD 40%, pLCx 80% s/p PCI/DES 0%, pRCA 40%, dRCA 30%, 1st RPLB 40%  . Chronic systolic CHF (congestive heart failure) ([place])    a. 02/2015 Echo: EF 20-25%, diff HK, mod MR, mildy dil LA, mildly dil RV w/ mod RV syst  dysfxn, mildly dil RA, mod TR, PASP 74mHg; b. 09/2017 Echo: EF 40-45%, diff HK. Gr1 DD. Mild to mod MR. Mildly to mod dil LA.  . Colon cancer ([place]    a. 2015 s/p colectomy followed by chemoRx with oxaliplatin & fluorouacil.  . Dia hypertension 07/20/2014  . Diabetes mellitus without complication ([place]   . Hypertension   . Hypertensive heart disease   . Mixed Ischemic and Non-ischemic Cardiomyopathy    a. 02/2015 Echo: EF 20-25%, diff HK; b. 09/2016 Echo: EF 40-45%.  . Moderate mitral regurgitation    a. 09/2017 Echo: Mild to mod TR.  . Tubular adenoma of colon     Past Surgical History:  Procedure Laterality Date  . CARDIAC CATHETERIZATION Bilateral 02/24/2015   Procedure: Right/Left Heart Cath and Coronary Angiography;  Surgeon: MWellington Hampshire MD;  [facility];  Service: Cardiovascular;  Laterality: Bilateral;  . CARDIAC CATHETERIZATION N/A 06/09/2015   Procedure: Coronary Stent Intervention;  Surgeon: MWellington Hampshire MD;  [facility];  Service: Cardiovascular;  Laterality: N/A;  . COLON SURGERY     Colectomy  . COLONOSCOPY    . COLONOSCOPY WITH PROPOFOL N/A 12/11/2016   Procedure: COLONOSCOPY WITH PROPOFOL;  Surgeon: SLollie Sails MD;  [facility];  Service: Endoscopy;  Laterality: N/A;  . ESOPHAGOGASTRODUODENOSCOPY    . PORT-A-CATH REMOVAL N/A 07/12/2014   Procedure: REMOVAL PORT-A-CATH;  Surgeon: WMolly Maduro MD;  Location: [facility] OR[facility];  Service: General;  Laterality: N/A;  . PORTACATH PLACEMENT Left     History reviewed. No pertinent family history.  Social History:  reports that he has never smoked. He has never used smokeless tobacco. He reports that he does not drink alcohol or use drugs.  He has [place] from the city.  [place]  He is accompanied by his wife, JRomie Minus today.  Allergies:  Allergies  Allergen Reactions  . Shellfish Allergy Other (See Comments)    Pt. instructed by MD to avoid  seafood    Current Medications: Current Outpatient Medications  Medication Sig Dispense Refill  . aspirin EC 81 MG tablet Take 1 tablet (81 mg total) by mouth daily. 90 tablet 3  . atorvastatin (LIPITOR) 40 MG tablet Take 1 tablet (40 mg total) by mouth daily. 90 tablet 1  . carvedilol (COREG) 3.125 MG tablet take 1 tablet by mouth twice a day 180 tablet 2  . gabapentin (NEURONTIN) 100 MG capsule Take 2 capsules (200 mg total) by mouth 3 (three) times daily. 180 capsule 0  . losartan (COZAAR) 50 MG tablet Take 1 tablet (50 mg total) by mouth daily. 90 tablet 3  . tamsulosin (FLOMAX) 0.4 MG CAPS capsule Take 1 capsule (0.4 mg total) by mouth daily after supper. 30 capsule 11  . cephALEXin (KEFLEX) 500 MG capsule Take 1 capsule (500 mg total) by mouth 2 (two) times daily. (Patient not taking: Reported on 02/10/2018) 14 capsule 0   No current facility-administered medications for this visit.     Review of Systems  Constitutional: Negative for chills, fever, malaise/fatigue and weight loss.  HENT: Negative for sore  throat.   Eyes: Negative for redness.  Respiratory: Negative for cough, shortness of breath and wheezing.   Cardiovascular: Negative for chest pain, palpitations and leg swelling.  Gastrointestinal: Negative for abdominal pain, blood in stool, nausea and vomiting.  Genitourinary: Negative for dysuria.  Musculoskeletal: Negative for myalgias.  Skin: Negative for rash.  Neurological: Negative for dizziness, tingling and tremors.  Endo/Heme/Allergies: Does not bruise/bleed easily.  Psychiatric/Behavioral: Negative for hallucinations.   Performance status (ECOG): 0  Vital Signs: BP 131/75 (BP Location: Left Arm)   Pulse 63   Temp (!) 96.6 F (35.9 C) (Tympanic)   Resp 18   Wt 190 lb 12.8 oz (86.5 kg)   BMI 28.18 kg/m   Physical Exam  Constitutional: He is oriented to person, place, and time. No distress.  HENT:  Head: Normocephalic and atraumatic.  Nose: Nose normal.   Mouth/Throat: Oropharynx is clear and moist. No oropharyngeal exudate.  Eyes: Pupils are equal, round, and reactive to light. EOM are normal. No scleral icterus.  Neck: Normal range of motion. Neck supple.  Cardiovascular: Normal rate and regular rhythm.  No murmur heard. Pulmonary/Chest: Effort normal. No respiratory distress. He has no rales. He exhibits no tenderness.  Abdominal: Soft. He exhibits no distension. There is no abdominal tenderness.  Musculoskeletal: Normal range of motion.        General: No edema.  Neurological: He is alert and oriented to person, place, and time. No cranial nerve deficit. He exhibits normal muscle tone. Coordination normal.  Skin: Skin is warm and dry. He is not diaphoretic. No erythema.  Psychiatric: Affect normal.   RADIOGRAPHIC STUDIES: I have personally reviewed the radiological images as listed and agreed with the findings in the report.01/11/2014:  Chest, abdomen, and pelvic CT revealed no evidence of metastatic disease. He was noted to have stable splenomegaly. There was some progression of tree-in-bud changes in the lungs, questionable MAC infection. There were chronic inflammatory changes. 01/12/2015:  Chest, abdomen, and pelvic CT revealed no evidence of metastatic disease in the chest, abdomen or pelvis.  There was a solitary 4 mm right upper lobe pulmonary nodule (stable). There was mild cardiomegaly (increased).  There was mild patchy ground-glass opacity and interlobular septal thickening throughout both lungs, suggestive of pulmonary edema, concerning for developing congestive heart failure.  There was stable dilated main pulmonary artery, suggesting pulmonary arterial hypertension.  There was left main and 3 vessel coronary atherosclerosis.   01/12/2016:  Chest, abdomen, and pelvic CT revealed no evidence of recurrent or metastatic carcinoma.  There was cholelithiasis without radiographic evidence of cholecystitis.  There was aortic and coronary  artery atherosclerosis. 08/15/2017:  Abdomen and pelvic CT without contrast revealed mild left hydronephroureter and bilateral renal hypodense lesions.   08/16/2017:  Abdomen MRI revealed abnormal appearance of the left kidney with striated appearance suggesting edema/infiltration, mild asymmetric left perirenal stranding, and heterogeneous debris in the left collecting system suspicious for pyelonephritis.   09/13/2017:  Abdomen and pelvic CT with and without contrast revealed no definite source for hematuria.  There were multiple simple cysts. No liver lesions.   Orders Only on 02/10/2018  Component Date Value Ref Range Status  . Retic Ct Pct 02/10/2018 2.2  0.4 - 3.1 % Final  . RBC. 02/10/2018 3.40* 4.22 - 5.81 MIL/uL Final  . Retic Count, Absolute 02/10/2018 73.4  19.0 - 186.0 K/uL Final  . Immature Retic Fract 02/10/2018 10.6  2.3 - 15.9 % Final  . Reticulocyte Hemoglobin 02/10/2018 32.8  >27.9 pg  Final   Comment:        Given the high negative predictive value of a RET-He result > 32 pg iron deficiency is essentially excluded. If this patient is anemic other etiologies should be considered. Performed at [facility], [address], [place], [place] [address]   Appointment on 02/10/2018  Component Date Value Ref Range Status  . WBC 02/10/2018 3.1* 4.0 - 10.5 K/uL Final  . RBC 02/10/2018 3.40* 4.22 - 5.81 MIL/uL Final  . Hemoglobin 02/10/2018 10.2* 13.0 - 17.0 g/dL Final  . HCT 02/10/2018 32.0* 39.0 - 52.0 % Final  . MCV 02/10/2018 94.1  80.0 - 100.0 fL Final  . MCH 02/10/2018 30.0  26.0 - 34.0 pg Final  . MCHC 02/10/2018 31.9  30.0 - 36.0 g/dL Final  . RDW 02/10/2018 15.9* 11.5 - 15.5 % Final  . Platelets 02/10/2018 119* 150 - 400 K/uL Final  . nRBC 02/10/2018 0.0  0.0 - 0.2 % Final  . Neutrophils Relative % 02/10/2018 69  % Final  . Neutro Abs 02/10/2018 2.1  1.7 - 7.7 K/uL Final  . Lymphocytes Relative 02/10/2018 15  % Final  . Lymphs Abs 02/10/2018 0.5* 0.7 - 4.0  K/uL Final  . Monocytes Relative 02/10/2018 13  % Final  . Monocytes Absolute 02/10/2018 0.4  0.1 - 1.0 K/uL Final  . Eosinophils Relative 02/10/2018 2  % Final  . Eosinophils Absolute 02/10/2018 0.1  0.0 - 0.5 K/uL Final  . Basophils Relative 02/10/2018 1  % Final  . Basophils Absolute 02/10/2018 0.0  0.0 - 0.1 K/uL Final  . Immature Granulocytes 02/10/2018 0  % Final  . Abs Immature Granulocytes 02/10/2018 0.01  0.00 - 0.07 K/uL Final   Performed at [facility], [address] [place], [place] [address]    CEA has been followed:  4.3 on 06/20/2013, 1.8 on 01/06/2014, 1.5 on 09/16/2014, 1.1 on 01/14/2015, 1.5 on 05/16/2015, 1.6 on 09/12/2015, 1.3 on 01/20/2016, 1.6 on 08/24/2016, 1.2 on 01/09/2017, and 1.1 on 09/27/2017.  Assessment:  Christopher Godinho. is a 73 y.o. male follows up for management of pancytopenia, vitamin B12 deficiency and stage IIIc colon cancer. 1. Anemia, unspecified type   2. Thrombocytopenia ([place])   3. B12 deficiency   4. Malignant neoplasm of sigmoid colon ([place])     1. Stage IIIC colon cancer: Patient's been doing well.  No symptoms.  CEA stable at 1.1 on 23 2019.  Colonoscopy in November 2018 showed multiple colon polyps.  Follow-up with gastroenterology Dr. Gustavo Lah.  Check CEA in 3 months. 2. Pancytopenia: Has been a chronic problem for him.  Counts remained stable.  Etiology multifactorial likely due to anemia of chronic disease, hematuria, vitamin deficiencies and possible underlying bone marrow disorders to previous chemotherapy.  Counts are stable.  Continue to monitor. Check retic panel.   3. Vitamin B12 deficiency, not responding to oral B12 supplements.  Currently on monthly vitamin B12 injections.  He will receive 1000 MCG vitamin B12 IM today.  RTC in 3 months for follow-up assessment.  Check CBC, CMP, CEA, vitamin B12   Earlie Server, MD, PhD Hematology Oncology [facility] at [facility] Pager- [phone] 02/10/2018

## 2018-02-10 NOTE — Progress Notes (Signed)
Patient here for follow up. No concerns voiced.  °

## 2018-02-13 ENCOUNTER — Encounter: Payer: Self-pay | Admitting: Urology

## 2018-02-13 ENCOUNTER — Ambulatory Visit (INDEPENDENT_AMBULATORY_CARE_PROVIDER_SITE_OTHER): Payer: PPO | Admitting: Urology

## 2018-02-13 VITALS — BP 161/84 | HR 68 | Temp 97.9°F | Resp 15 | Ht 69.0 in | Wt 188.0 lb

## 2018-02-13 DIAGNOSIS — Z87448 Personal history of other diseases of urinary system: Secondary | ICD-10-CM | POA: Diagnosis not present

## 2018-02-13 LAB — MICROSCOPIC EXAMINATION: BACTERIA UA: NONE SEEN

## 2018-02-13 LAB — URINALYSIS, COMPLETE
Bilirubin, UA: NEGATIVE
Glucose, UA: NEGATIVE
Ketones, UA: NEGATIVE
Leukocytes, UA: NEGATIVE
NITRITE UA: NEGATIVE
PH UA: 5.5 (ref 5.0–7.5)
Specific Gravity, UA: 1.025 (ref 1.005–1.030)
Urobilinogen, Ur: 0.2 mg/dL (ref 0.2–1.0)

## 2018-02-13 NOTE — Progress Notes (Addendum)
02/13/2018 3:43 PM   [name] 05-Mar-1945 [id number]  Referring provider: Leone Haven, MD [address] [place], [place] [address]  Chief Complaint  Patient presents with  . Follow-up    HPI: Patient is a 73 -year-old [place] American male with a history of gross hematuria, left hydronephrosis, pyelonephrosis and BPH with LUTS who returns today for a 4 month f/u after a cystoscopy.    His UA today is negative. He still continues taking Tamsulosin. Patient denies any gross hematuria, dysuria or suprapubic/flank pain.  Patient denies any fevers, chills, nausea or vomiting.    [name] was referred to [place] from [name] for gross heamturia, left hydronephrosis, pyelonephrosis and BPH with LUTS.   [name] is a 73yo with a hx of CAD, CHF, HTN, and colon cancer admitted 7/10 with UTI and pyelonephritis. He underwent CT which showed mild left hydronephrosis to the level of the UVJ and perinephric stranding and debris consistent with pyelonephritis. Per the patient his is his firs t UTI. At baseline he has severe LUTS. He has frequency every 1-1.5 hours, urgency, urge incontinence, nocturia 3-4, a feeling of incomplete emptying. He had gross hematuria 10 days ago. No dysuria. No hx of nephrolithiasis.  In 2015 he underwent colon resection followed by chemotherapy. His LUTS at that time were mild and shortly after he finished his chemo regiment his LUTS became severe. He has never been on a BPH med. Creatinine on admission was 3 and currently it is  2.07.  discussed the causes of UTIs with pyelonephritis with this patient and likely his UTI was caused by incomplete bladder emptying. His mild hydronephrosis is likely related to his pyelonephritis. We will have him followup in 2 weeks with [name] for upper tract imaging to ensure resolution of his hydronephrosis. Please continue him on 2 weeks of culture specific antibiotics for his  pyelonephritis.  2. BPH with LUTS: I discussed the management with the patient including medical therapy and surgical intervention. We discussed alpha blocker therapy and the patient wishes to pursue this treatment. We will start flomax 0.4mg  QHS. 3. Gross hematuria: The patient will need a formal hematuria workup as an outpatient.He does not have a prior history of recurrent urinary tract infections, nephrolithiasis, trauma to the genitourinary tract, BPH or malignancies of the genitourinary tract. He does not have a family medical history of nephrolithiasis, malignancies of the genitourinary tract or hematuria.  He is having symptoms of nocturia and incontinence.  His UA today demonstrates 11-30 WBC's, 3-10 RBC's and many bacteria.  He is currently on Keflex.   RUS on 08/14/2017 noted a 2.6 x 1.7 x 2.1 cm solid masslike abnormality suggested of the upper pole the left kidney for which dedicated CT or MRI using renal protocol is recommended for further characterization.  Stable bilateral renal cysts are otherwise noted. There is mild fullness of left renal collecting system that may represent mild hydronephrosis.  Rounded echogenic nonshadowing subcentimeter lesions of the spleen more commonly associated with hemangiomata. No additional workup required given nonaggressive features. Non contrast CT on 08/15/2017 noted mild left hydronephroureter. No obstructing stone identified.  Underlying distal left ureteral lesion is not entirely excluded.  Further evaluation with dedicated CT urography recommended.  Bilateral renal hypodense lesions incompletely evaluated or characterized on this unenhanced CT.   Aortic Atherosclerosis  MRI on 08/16/2017 noted an abnormal expansion of the left kidney with striated appearance suggesting diffuse edema/infiltration.  Mildly asymmetric left perirenal stranding. Heterogeneous debris in the left collecting system/dilated proximal left ureter. The striated appearance is  disproportionate to the degree of hydronephrosis, and I am suspicious for pyelonephritis and possibly pyonephrosis. Infiltrating renal malignancy is possible but less likely. No expansion of the left renal vein to suggest renal vein thrombosis as a alternative etiology.  Bilateral renal cysts. A lesion anteriorly in the left kidney lower pole is technically nonspecific and clearly complex. This could be a complex cyst or mass.  Cholelithiasis with sludge in the gallbladder.  Mild cardiomegaly with trace bilateral pleural effusions.   Scattered small T2 signal hyperintensities in the spleen,nonspecific but stable from 06/19/2013 hence probably benign.  Aortic Atherosclerosis  He is not a smoker. He is not exposed to secondhand smoke.  He has worked with Sports administrator, trichloroethylene, etc. He has HTN.  He had a positive urine culture on 08/21/2017 for Staphylococcus aureus and Septra DS, twice daily for seven days was prescribed to pharmacy.  Cystoscopy on 10/03/2017 showed prostate enlargement and no evidence of bladder pathology.   PMH: Past Medical History:  Diagnosis Date  . Acute renal failure (ARF) ([place]) 08/14/2017  . Anemia   . CAD (coronary artery disease)    a. 02/2015 Cath: LM nl, LAD 50/40 mid/distal, LCX 80p, RCA 40p, 30d, RPL1 40, CO 3.27, CI 1.65; b. cath 06/09/15: LM minor irregs, m-dLAD 50%, dLAD 40%, pLCx 80% s/p PCI/DES 0%, pRCA 40%, dRCA 30%, 1st RPLB 40%  . Chronic systolic CHF (congestive heart failure) ([place])    a. 02/2015 Echo: EF 20-25%, diff HK, mod Christopher, mildy dil LA, mildly dil RV w/ mod RV syst dysfxn, mildly dil RA, mod TR, PASP 15mmHg; b. 09/2017 Echo: EF 40-45%, diff HK. Gr1 DD. Mild to mod Christopher. Mildly to mod dil LA.  . Colon cancer ([place])    a. 2015 s/p colectomy followed by chemoRx with oxaliplatin & fluorouacil.  . Dia hypertension 07/20/2014  . Diabetes mellitus without complication ([place])   . Hypertension   . Hypertensive heart disease   . Mixed Ischemic and  Non-ischemic Cardiomyopathy    a. 02/2015 Echo: EF 20-25%, diff HK; b. 09/2016 Echo: EF 40-45%.  . Moderate mitral regurgitation    a. 09/2017 Echo: Mild to mod TR.  . Tubular adenoma of colon     Surgical History: Past Surgical History:  Procedure Laterality Date  . CARDIAC CATHETERIZATION Bilateral 02/24/2015   Procedure: Right/Left Heart Cath and Coronary Angiography;  Surgeon: Wellington Hampshire, MD;  Location: [facility];  Service: Cardiovascular;  Laterality: Bilateral;  . CARDIAC CATHETERIZATION N/A 06/09/2015   Procedure: Coronary Stent Intervention;  Surgeon: Wellington Hampshire, MD;  Location: [facility];  Service: Cardiovascular;  Laterality: N/A;  . COLON SURGERY     Colectomy  . COLONOSCOPY    . COLONOSCOPY WITH PROPOFOL N/A 12/11/2016   Procedure: COLONOSCOPY WITH PROPOFOL;  Surgeon: Lollie Sails, MD;  Location: [facility];  Service: Endoscopy;  Laterality: N/A;  . ESOPHAGOGASTRODUODENOSCOPY    . PORT-A-CATH REMOVAL N/A 07/12/2014   Procedure: REMOVAL PORT-A-CATH;  Surgeon: Molly Maduro, MD;  Location: [facility];  Service: General;  Laterality: N/A;  . PORTACATH PLACEMENT Left     Home Medications:  Allergies as of 02/13/2018      Reactions   Shellfish Allergy Other (See Comments)   Pt. instructed by MD to avoid seafood      Medication List       Accurate as  of February 13, 2018  3:43 PM. Always use your most recent med list.        aspirin EC 81 MG tablet Take 1 tablet (81 mg total) by mouth daily.   atorvastatin 40 MG tablet Commonly known as:  LIPITOR Take 1 tablet (40 mg total) by mouth daily.   carvedilol 3.125 MG tablet Commonly known as:  COREG take 1 tablet by mouth twice a day   gabapentin 100 MG capsule Commonly known as:  NEURONTIN Take 2 capsules (200 mg total) by mouth 3 (three) times daily.   losartan 50 MG tablet Commonly known as:  COZAAR Take 1 tablet (50 mg total) by mouth daily.   tamsulosin 0.4 MG Caps  capsule Commonly known as:  FLOMAX Take 1 capsule (0.4 mg total) by mouth daily after supper.       Allergies:  Allergies  Allergen Reactions  . Shellfish Allergy Other (See Comments)    Pt. instructed by MD to avoid seafood    Family History: No family history on file.  Social History:  reports that he has never smoked. He has never used smokeless tobacco. He reports that he does not drink alcohol or use drugs.  ROS: UROLOGY Frequent Urination?: No Hard to postpone urination?: No Burning/pain with urination?: No Get up at night to urinate?: No Leakage of urine?: No Urine stream starts and stops?: No Trouble starting stream?: No Do you have to strain to urinate?: No Blood in urine?: No Urinary tract infection?: No Sexually transmitted disease?: No Injury to kidneys or bladder?: No Painful intercourse?: No Weak stream?: No Erection problems?: No Penile pain?: No  Gastrointestinal Nausea?: No Vomiting?: No Indigestion/heartburn?: No Diarrhea?: No Constipation?: No  Constitutional Fever: No Night sweats?: No Weight loss?: No Fatigue?: No  Skin Skin rash/lesions?: No Itching?: No  Eyes Blurred vision?: No Double vision?: No  Ears/Nose/Throat Sore throat?: No Sinus problems?: No  Hematologic/Lymphatic Swollen glands?: No Easy bruising?: No  Cardiovascular Leg swelling?: No Chest pain?: No  Respiratory Cough?: No Shortness of breath?: No  Endocrine Excessive thirst?: No  Musculoskeletal Back pain?: No Joint pain?: No  Neurological Headaches?: No Dizziness?: No  Psychologic Depression?: No Anxiety?: No  Physical Exam: BP (!) 161/84   Pulse 68   Temp 97.9 F (36.6 C)   Resp 15   Ht 5\' 9"  (1.753 m)   Wt 188 lb (85.3 kg)   BMI 27.76 kg/m   Constitutional:  Well nourished. Alert and oriented, No acute distress. HEENT: [place] AT, moist mucus membranes.  Trachea midline, no masses. Cardiovascular: No clubbing, cyanosis, or  edema. Respiratory: Normal respiratory effort, no increased work of breathing. Skin: No rashes, bruises or suspicious lesions. Neurologic: Grossly intact, no focal deficits, moving all 4 extremities. Psychiatric: Normal mood and affect.  Laboratory Data: Lab Results  Component Value Date   WBC 3.1 (L) 02/10/2018   HGB 10.2 (L) 02/10/2018   HCT 32.0 (L) 02/10/2018   MCV 94.1 02/10/2018   PLT 119 (L) 02/10/2018    Urinalysis See hpi and epic  I have reviewed the labs.  Pertinent Imaging: CLINICAL DATA:  73 year old male with history of hematuria. Recent urinary tract infection. History of colon cancer status post partial colectomy and chemotherapy.  EXAM: CT ABDOMEN AND PELVIS WITHOUT AND WITH CONTRAST  TECHNIQUE: Multidetector CT imaging of the abdomen and pelvis was performed following the standard protocol before and following the bolus administration of intravenous contrast.  CONTRAST:  191mL ISOVUE-300 IOPAMIDOL (ISOVUE-300) INJECTION 61%  COMPARISON:  CT the abdomen and pelvis 08/15/2017. MRI of the abdomen 08/16/2017.  FINDINGS: Lower chest: Cardiomegaly. Atherosclerosis in the descending thoracic aorta, as well as the left anterior descending and right coronary arteries.  Hepatobiliary: No suspicious cystic or solid hepatic lesions. No intra or extrahepatic biliary ductal dilatation. Tiny calcified gallstone in the neck of the gallbladder. No findings to suggest an acute cholecystitis at this time.  Pancreas: No pancreatic mass. No pancreatic ductal dilatation. No pancreatic or peripancreatic fluid or inflammatory changes.  Spleen: Unremarkable.  Adrenals/Urinary Tract: No calcifications are noted within the collecting system of either kidney, along the course of either ureter, or within the lumen of the urinary bladder. Multiple low-attenuation lesions in the kidneys bilaterally, compatible with simple cysts, largest of which measures 3.3 cm  in the posterior aspect of the lower pole of the right kidney. Other subcentimeter low-attenuation lesions in both kidneys are too small to definitively characterize, but are favored to represent tiny cysts. On postcontrast delayed images there are no filling defects within the collecting system of either kidney, along the course of either ureter, or within the lumen of the urinary bladder to strongly suggest the presence of a urothelial neoplasm at this time. Urinary bladder is normal in appearance. Bilateral adrenal glands are normal in appearance.  Stomach/Bowel: Normal appearance of the stomach. No pathologic dilatation of small bowel or colon. Status post appendectomy.  Vascular/Lymphatic: Aortic atherosclerosis, without evidence of aneurysm or dissection in the abdominal or pelvic vasculature. No lymphadenopathy noted in the abdomen or pelvis.  Reproductive: Prostate gland and seminal vesicles are unremarkable in appearance. Scrotum is incompletely imaged, but there appear to be bilateral hydroceles (left greater than right).  Other: No significant volume of ascites.  No pneumoperitoneum.  Musculoskeletal: There are no aggressive appearing lytic or blastic lesions noted in the visualized portions of the skeleton.  IMPRESSION: 1. No definite source for hematuria identified on today's examination. Specifically, no urinary tract calculi and no findings of urinary tract obstruction. In addition to multiple simple cysts there are several subcentimeter low-attenuation lesions in the kidneys bilaterally. These are too small to definitively characterize, but are favored to represent small cysts. Should the patient's hematuria persist or worsen, these could be definitively characterized with MRI of the abdomen with and without IV gadolinium if clinically appropriate. 2. Cholelithiasis without evidence of acute cholecystitis at this time. 3. Aortic atherosclerosis, in  addition to at least 2 vessel coronary artery disease. Assessment for potential risk factor modification, dietary therapy or pharmacologic therapy may be warranted, if clinically indicated. 4. Cardiomegaly. 5. Bilateral hydroceles incompletely imaged (left greater than right).  Aortic Atherosclerosis (ICD10-I70.0).   Electronically Signed   By: Vinnie Langton M.D.   On: 09/13/2017 18:13   Assessment & Plan:    1. History of hematuria UA is negative  Cystoscopy showed prostate enlargement and no evidence of bladder pathology.  CTU from 09/13/2017 showed no definite source for hematuria identified on today's examination. Specifically, no urinary tract calculi and no findings of urinary tract obstruction. In addition to multiple simple cysts there are several subcentimeter low-attenuation lesions in the kidneys bilaterally. These are too small to definitively characterize, but are favored to represent small cysts. Should the patient's hematuria persist or worsen, these could be definitively characterized with MRI of the abdomen with and without IV gadoliniumif clinically appropriate. Bilateral hydroceles incompletely imaged (left greater than Right). Return in 6 months to check UA; if gross hematuria return sooner  2. Left  renal mass CTU report (see 1)  3. Left hydronephrosis CTU report (see 1)   4. BPH with LUTS Continue conservative management, avoiding bladder irritants and timed voiding's Continue tamsulosin 0.4 mg daily   5. Spermatocele vs hydrocele Scrotal US showed small left epididymal head cyst. No hydrocele or other abnormality noted.   6. Prostate nodule Discussed further at this visit Explained that due to his age and comorbidities, prostate cancer treatment would be approached in a palliative manner versus curative and he other health concerns take precedence at this time and would have a greater bearing on his life span vs prostate cancer at this time He does  have the option to be surveyed at this time for prostate cancer, but it may involve invasive and risky procedures for diagnosis, I did advise that a high grade cancer would have the potential to spread and cause organ failure and/or pathological fractures  Pt is not wanting to puruse surveillance of prostate cancer at this time due to his age and co morbidities   Return in about 6 months (around 08/14/2018) for UA and office visit .  These notes generated with voice recognition software. I apologize for typographical errors.  Zara Council, PA-C  [facility] [address] [place]  [place],  [phone]  I, Lucas Mallow, am acting as a Education administrator for [employer],  I have reviewed the above documentation for accuracy and completeness, and I agree with the above.    Zara Council, PA-C

## 2018-03-01 ENCOUNTER — Other Ambulatory Visit: Payer: Self-pay | Admitting: Cardiovascular Disease

## 2018-03-10 ENCOUNTER — Inpatient Hospital Stay: Payer: PPO | Attending: Oncology

## 2018-03-10 DIAGNOSIS — E538 Deficiency of other specified B group vitamins: Secondary | ICD-10-CM | POA: Diagnosis not present

## 2018-03-10 MED ORDER — CYANOCOBALAMIN 1000 MCG/ML IJ SOLN
1000.0000 ug | Freq: Once | INTRAMUSCULAR | Status: AC
  Administered 2018-03-10: 1000 ug via INTRAMUSCULAR

## 2018-04-02 DIAGNOSIS — N183 Chronic kidney disease, stage 3 (moderate): Secondary | ICD-10-CM | POA: Diagnosis not present

## 2018-04-02 DIAGNOSIS — I129 Hypertensive chronic kidney disease with stage 1 through stage 4 chronic kidney disease, or unspecified chronic kidney disease: Secondary | ICD-10-CM | POA: Diagnosis not present

## 2018-04-02 DIAGNOSIS — R809 Proteinuria, unspecified: Secondary | ICD-10-CM | POA: Diagnosis not present

## 2018-04-02 DIAGNOSIS — D631 Anemia in chronic kidney disease: Secondary | ICD-10-CM | POA: Diagnosis not present

## 2018-04-02 DIAGNOSIS — E1122 Type 2 diabetes mellitus with diabetic chronic kidney disease: Secondary | ICD-10-CM | POA: Diagnosis not present

## 2018-04-07 ENCOUNTER — Inpatient Hospital Stay: Payer: PPO

## 2018-04-07 ENCOUNTER — Inpatient Hospital Stay: Payer: PPO | Attending: Oncology

## 2018-04-07 ENCOUNTER — Inpatient Hospital Stay: Payer: PPO | Admitting: Oncology

## 2018-04-07 DIAGNOSIS — E538 Deficiency of other specified B group vitamins: Secondary | ICD-10-CM | POA: Diagnosis not present

## 2018-04-07 MED ORDER — CYANOCOBALAMIN 1000 MCG/ML IJ SOLN
1000.0000 ug | Freq: Once | INTRAMUSCULAR | Status: AC
  Administered 2018-04-07: 1000 ug via INTRAMUSCULAR

## 2018-04-25 ENCOUNTER — Other Ambulatory Visit: Payer: Self-pay | Admitting: *Deleted

## 2018-04-25 MED ORDER — TAMSULOSIN HCL 0.4 MG PO CAPS: 0.4000 mg | ORAL_CAPSULE | Freq: Every day | ORAL | 11 refills | Status: DC

## 2018-04-28 ENCOUNTER — Ambulatory Visit (INDEPENDENT_AMBULATORY_CARE_PROVIDER_SITE_OTHER): Payer: PPO | Admitting: Family Medicine

## 2018-04-28 ENCOUNTER — Encounter: Payer: Self-pay | Admitting: Family Medicine

## 2018-04-28 ENCOUNTER — Other Ambulatory Visit: Payer: Self-pay

## 2018-04-28 VITALS — BP 124/76 | HR 52 | Temp 97.9°F | Ht 69.0 in | Wt 183.1 lb

## 2018-04-28 DIAGNOSIS — S81802A Unspecified open wound, left lower leg, initial encounter: Secondary | ICD-10-CM | POA: Diagnosis not present

## 2018-04-28 DIAGNOSIS — G629 Polyneuropathy, unspecified: Secondary | ICD-10-CM

## 2018-04-28 DIAGNOSIS — E119 Type 2 diabetes mellitus without complications: Secondary | ICD-10-CM

## 2018-04-28 DIAGNOSIS — I10 Essential (primary) hypertension: Secondary | ICD-10-CM | POA: Diagnosis not present

## 2018-04-28 DIAGNOSIS — E785 Hyperlipidemia, unspecified: Secondary | ICD-10-CM | POA: Diagnosis not present

## 2018-04-28 NOTE — Assessment & Plan Note (Signed)
This is likely related to his chemotherapy.  He is receiving B12 injections.  His A1c previously had improved.  He will continue gabapentin and continue to monitor.

## 2018-04-28 NOTE — Assessment & Plan Note (Signed)
Check A1c.  Check urine microalbumin.

## 2018-04-28 NOTE — Assessment & Plan Note (Signed)
Well-controlled.  Continue current regimen.  Check BMP. 

## 2018-04-28 NOTE — Patient Instructions (Signed)
Nice to see you.  Please only use soap and water to clean your legs.  Please monitor your wounds.  If you develop redness, warmth, pain, swelling, fever, or drainage of pus please seek medical attention immediately.  If these wounds are not healing over the next 1 week please contact us. We will check lab work today and contact you with the results.

## 2018-04-28 NOTE — Assessment & Plan Note (Signed)
Continue current medication.

## 2018-04-28 NOTE — Assessment & Plan Note (Signed)
Patient with new leg wound.  Patient reports these have been improving over the last several weeks.  There is no sign of infection on exam today.  Discussed cleansing with soap and water.  Topical antibiotic ointment can be used.  No indication for systemic antibiotics.  We will get him set up for a one-week phone call follow-up.  He is given return precautions and advised to seek medical attention immediately if he develops signs of infection such as fever, redness, drainage, pain, swelling, or warmth.

## 2018-04-28 NOTE — Progress Notes (Signed)
Tommi Rumps, MD Phone: [phone]  Marlowe Lawes Keiden Deskin. is a 73 y.o. male who presents today for follow-up.  Leg wound: Patient notes this has been present for about 3 weeks.  He notes that it likely started after he soaked his legs in peroxide and Clorox mixed with water.  Notes it has progressively been improving.  There is no pain.  There has been some serous drainage that has improved.  No fever or redness.  Hypertension: He is not checking his blood pressure at home.  He is taking carvedilol and losartan.  No chest pain, shortness of breath, or edema.  Hyperlipidemia: Taking Lipitor.  No right upper quadrant pain or myalgias.  Bilateral leg and finger numbness: He notes this has been going on since he received chemotherapy.  This is a side effect of 1 of the chemotherapy drugs he received.  He notes his left leg from below his knee down to his feet is slightly more numb than the right leg.  He notes his fingers are numb as well.  He notes this has not worsened.  This may have improved slightly since coming off chemotherapy.  He notes no weakness.  No vision changes.  No other numbness.  Social History   Tobacco Use  Smoking Status Never Smoker  Smokeless Tobacco Never Used     ROS see history of present illness  Objective  Physical Exam Vitals:   04/28/18 1527  BP: 124/76  Pulse: (!) 52  Temp: 97.9 F (36.6 C)  SpO2: 99%    BP Readings from Last 3 Encounters:  04/28/18 124/76  02/13/18 (!) 161/84  02/10/18 131/75   Wt Readings from Last 3 Encounters:  04/28/18 183 lb 1.9 oz (83.1 kg)  02/13/18 188 lb (85.3 kg)  02/10/18 190 lb 12.8 oz (86.5 kg)    Physical Exam Constitutional:      General: He is not in acute distress.    Appearance: He is not diaphoretic.  Cardiovascular:     Rate and Rhythm: Normal rate and regular rhythm.     Heart sounds: Normal heart sounds.     Comments: 2+ DP pulses Pulmonary:     Effort: Pulmonary effort is normal.     Breath  sounds: Normal breath sounds.  Musculoskeletal:     Right lower leg: No edema.     Left lower leg: No edema.  Skin:    General: Skin is warm and dry.  Neurological:     Mental Status: He is alert.     Comments: CN 2-12 intact, 5/5 strength in bilateral biceps, triceps, grip, quads, hamstrings, plantar and dorsiflexion, sensation to light touch intact in bilateral UE and LE, normal gait     Left lower leg with 3 wounds, there is no surrounding warmth or tenderness, no induration or fluctuance   Assessment/Plan: Please see individual problem list.  Hypertension Well-controlled.  Continue current regimen.  Check BMP.  Diabetes ([facility]) Check A1c.  Check urine microalbumin.  Neuropathy ([place]) This is likely related to his chemotherapy.  He is receiving B12 injections.  His A1c previously had improved.  He will continue gabapentin and continue to monitor.  Leg wound, left, initial encounter Patient with new leg wound.  Patient reports these have been improving over the last several weeks.  There is no sign of infection on exam today.  Discussed cleansing with soap and water.  Topical antibiotic ointment can be used.  No indication for systemic antibiotics.  We will get him  set up for a one-week phone call follow-up.  He is given return precautions and advised to seek medical attention immediately if he develops signs of infection such as fever, redness, drainage, pain, swelling, or warmth.  Hyperlipidemia Continue current medication.   Orders Placed This Encounter  Procedures  . Basic Metabolic Panel (BMET)  . HgB A1c  . Urine Microalbumin w/creat. ratio    No orders of the defined types were placed in this encounter.    Tommi Rumps, MD [place]

## 2018-04-29 LAB — HEMOGLOBIN A1C: Hgb A1c MFr Bld: 6.9 % — ABNORMAL HIGH (ref 4.6–6.5)

## 2018-04-29 LAB — BASIC METABOLIC PANEL
BUN: 18 mg/dL (ref 6–23)
CHLORIDE: 111 meq/L (ref 96–112)
CO2: 25 meq/L (ref 19–32)
CREATININE: 1.37 mg/dL (ref 0.40–1.50)
Calcium: 8.6 mg/dL (ref 8.4–10.5)
GFR: 61.92 mL/min (ref >60.00)
GLUCOSE: 128 mg/dL — AB (ref 70–99)
Potassium: 4 mEq/L (ref 3.5–5.1)
Sodium: 142 mEq/L (ref 135–145)

## 2018-04-29 LAB — MICROALBUMIN / CREATININE URINE RATIO
Creatinine,U: 191.8 mg/dL
MICROALB UR: 42.4 mg/dL — AB (ref 0.0–1.9)
Microalb Creat Ratio: 22.1 mg/g (ref 0.0–30.0)

## 2018-05-02 ENCOUNTER — Telehealth: Payer: Self-pay

## 2018-05-02 NOTE — Telephone Encounter (Signed)
Per lab notes   Please let the patient know that his A1c has gone back up into the diabetic range. He would likely benefit from a low-dose of metformin to help treat this. His urine protein level is elevated. He is currently on medication to help treat this. I would suggest that we continue with the losartan and plan to recheck in 3 months at follow-up. Thanks.  Pt is willing to start metformin.   Send to [facility]

## 2018-05-02 NOTE — Telephone Encounter (Signed)
Copied from [place] [id number]. Topic: Quick Communication - Rx Refill/Question >> May 02, 2018 12:47 PM Rayann Heman wrote: Medication: Pharmacy called and left a voice mail that states that the patient stated that we where going to send a new mediation over to the pharmacy. Pharmacy would like a call regarding. Please advise

## 2018-05-03 ENCOUNTER — Other Ambulatory Visit: Payer: Self-pay | Admitting: Family Medicine

## 2018-05-03 MED ORDER — METFORMIN HCL 500 MG PO TABS: 500.0000 mg | ORAL_TABLET | Freq: Two times a day (BID) | ORAL | 3 refills | Status: DC

## 2018-05-03 NOTE — Telephone Encounter (Signed)
Sent to pharmacy 

## 2018-05-05 ENCOUNTER — Ambulatory Visit: Payer: PPO | Admitting: Family Medicine

## 2018-05-05 ENCOUNTER — Telehealth: Payer: Self-pay | Admitting: Family Medicine

## 2018-05-05 ENCOUNTER — Telehealth: Payer: Self-pay

## 2018-05-05 NOTE — Telephone Encounter (Signed)
Patient was supposed to have a phone follow-up for the wound on his leg today. I called him twice and [name] called him 3 times. No answer. Messages left asking him to call back to the office. Please call him later today or tomorrow to check in with him and see how his leg wounds are doing. Thanks.

## 2018-05-05 NOTE — Telephone Encounter (Signed)
Copied from [place] [id number]. Topic: General - Other >> May 05, 2018 11:35 AM Carolyn Stare wrote:  Pt missed his 10;30 call and would like a call back

## 2018-05-05 NOTE — Telephone Encounter (Signed)
Called and spoke with patient. Pt stated that it is still draining clear reddish fluid. Wounds still look red, no swelling, no warmth to the touch, slight pain, pain level is a 3 out of 1-10 pain scale. Pt declined having a fever and stated that he does feel like it is getting better and will continue to get better with time.   Sent to PCP

## 2018-05-05 NOTE — Telephone Encounter (Signed)
Noted.  Seems to be improving.  I would expect the actual wounds do appear somewhat red based on my prior exam.  CMA noted that the patient did not report any surrounding erythema.  Please advise that he continue to monitor and if they are not healing over the next week he needs to let us know.  If he develops signs of infection such as redness surrounding the wounds, warmth, increasing tenderness, or drainage of pus he needs to be evaluated immediately.

## 2018-05-05 NOTE — Telephone Encounter (Signed)
Will call pt to check on how he is doing with his wounds.

## 2018-05-05 NOTE — Telephone Encounter (Signed)
Called pt and left a detailed VM that metformin was sent into his [facility] pharmacy.

## 2018-05-06 NOTE — Telephone Encounter (Signed)
Called and spoke with pt. Pt advised and voiced understanding.  

## 2018-05-07 ENCOUNTER — Inpatient Hospital Stay: Payer: PPO | Attending: Oncology

## 2018-05-07 ENCOUNTER — Other Ambulatory Visit: Payer: Self-pay

## 2018-05-07 DIAGNOSIS — D72819 Decreased white blood cell count, unspecified: Secondary | ICD-10-CM | POA: Diagnosis not present

## 2018-05-07 DIAGNOSIS — D696 Thrombocytopenia, unspecified: Secondary | ICD-10-CM | POA: Diagnosis not present

## 2018-05-07 DIAGNOSIS — C187 Malignant neoplasm of sigmoid colon: Secondary | ICD-10-CM | POA: Insufficient documentation

## 2018-05-07 DIAGNOSIS — D61818 Other pancytopenia: Secondary | ICD-10-CM | POA: Diagnosis not present

## 2018-05-07 DIAGNOSIS — E538 Deficiency of other specified B group vitamins: Secondary | ICD-10-CM | POA: Diagnosis not present

## 2018-05-07 DIAGNOSIS — D649 Anemia, unspecified: Secondary | ICD-10-CM | POA: Diagnosis not present

## 2018-05-07 LAB — COMPREHENSIVE METABOLIC PANEL
ALT: 25 U/L (ref 0–44)
AST: 21 U/L (ref 15–41)
Albumin: 3.3 g/dL — ABNORMAL LOW (ref 3.5–5.0)
Alkaline Phosphatase: 108 U/L (ref 38–126)
Anion gap: 6 (ref 5–15)
BUN: 17 mg/dL (ref 8–23)
CO2: 24 mmol/L (ref 22–32)
Calcium: 8.4 mg/dL — ABNORMAL LOW (ref 8.9–10.3)
Chloride: 110 mmol/L (ref 98–111)
Creatinine, Ser: 1.41 mg/dL — ABNORMAL HIGH (ref 0.61–1.24)
GFR calc Af Amer: 58 mL/min — ABNORMAL LOW (ref >60)
GFR calc non Af Amer: 50 mL/min — ABNORMAL LOW (ref >60)
Glucose, Bld: 146 mg/dL — ABNORMAL HIGH (ref 70–99)
Potassium: 3.8 mmol/L (ref 3.5–5.1)
Sodium: 140 mmol/L (ref 135–145)
Total Bilirubin: 1.2 mg/dL (ref 0.3–1.2)
Total Protein: 6.7 g/dL (ref 6.5–8.1)

## 2018-05-07 LAB — CBC WITH DIFFERENTIAL/PLATELET
Abs Immature Granulocytes: 0 10*3/uL (ref 0.00–0.07)
Basophils Absolute: 0 10*3/uL (ref 0.0–0.1)
Basophils Relative: 1 %
Eosinophils Absolute: 0.1 10*3/uL (ref 0.0–0.5)
Eosinophils Relative: 2 %
HCT: 35.3 % — ABNORMAL LOW (ref 39.0–52.0)
Hemoglobin: 11.4 g/dL — ABNORMAL LOW (ref 13.0–17.0)
Immature Granulocytes: 0 %
Lymphocytes Relative: 19 %
Lymphs Abs: 0.5 10*3/uL — ABNORMAL LOW (ref 0.7–4.0)
MCH: 31.1 pg (ref 26.0–34.0)
MCHC: 32.3 g/dL (ref 30.0–36.0)
MCV: 96.4 fL (ref 80.0–100.0)
Monocytes Absolute: 0.4 10*3/uL (ref 0.1–1.0)
Monocytes Relative: 15 %
Neutro Abs: 1.8 10*3/uL (ref 1.7–7.7)
Neutrophils Relative %: 63 %
Platelets: 136 10*3/uL — ABNORMAL LOW (ref 150–400)
RBC: 3.66 MIL/uL — ABNORMAL LOW (ref 4.22–5.81)
RDW: 15.6 % — ABNORMAL HIGH (ref 11.5–15.5)
WBC: 2.8 10*3/uL — ABNORMAL LOW (ref 4.0–10.5)
nRBC: 0 % (ref 0.0–0.2)

## 2018-05-07 LAB — VITAMIN B12: Vitamin B-12: 371 pg/mL (ref 180–914)

## 2018-05-08 ENCOUNTER — Inpatient Hospital Stay: Payer: PPO

## 2018-05-08 ENCOUNTER — Inpatient Hospital Stay (HOSPITAL_BASED_OUTPATIENT_CLINIC_OR_DEPARTMENT_OTHER): Payer: PPO | Admitting: Oncology

## 2018-05-08 ENCOUNTER — Other Ambulatory Visit: Payer: Self-pay

## 2018-05-08 ENCOUNTER — Encounter: Payer: Self-pay | Admitting: Oncology

## 2018-05-08 VITALS — BP 147/83 | HR 64 | Wt 184.0 lb

## 2018-05-08 DIAGNOSIS — D72819 Decreased white blood cell count, unspecified: Secondary | ICD-10-CM | POA: Diagnosis not present

## 2018-05-08 DIAGNOSIS — D649 Anemia, unspecified: Secondary | ICD-10-CM | POA: Diagnosis not present

## 2018-05-08 DIAGNOSIS — D696 Thrombocytopenia, unspecified: Secondary | ICD-10-CM | POA: Diagnosis not present

## 2018-05-08 DIAGNOSIS — E538 Deficiency of other specified B group vitamins: Secondary | ICD-10-CM

## 2018-05-08 DIAGNOSIS — C187 Malignant neoplasm of sigmoid colon: Secondary | ICD-10-CM

## 2018-05-08 DIAGNOSIS — D61818 Other pancytopenia: Secondary | ICD-10-CM

## 2018-05-08 MED ORDER — CYANOCOBALAMIN 1000 MCG/ML IJ SOLN
1000.0000 ug | Freq: Once | INTRAMUSCULAR | Status: AC
  Administered 2018-05-08: 1000 ug via INTRAMUSCULAR

## 2018-05-08 NOTE — Progress Notes (Signed)
[place]  Chief Complaint: Christopher Bunyard. is a 73 y.o. male with a history of stage IIIC colon cancer, pancytopenia, and B12 deficiency who presented to follow-up for management of history of colon cancer and vitamin B12 deficiency.   PERTINENT ONCOLOGY HISTORY Patient previous oncology care was with Dr. Mike Gip who has moved practice to [facility]. Patient switches care to me on 02/10/2018  # stage IIIC colon cancer.  He presented with symptomatic anemia.  He underwent descending and proximal sigmoid colectomy, left ureteral lysis, and partial cecectomy on 06/23/2013. Six of 22 lymph nodes were positive.  There were tumor deposits (discontinuous extramural extension).  There was lymphovascular and perineural invasion. Pathologic stage was IIIC (T4bN2a M0). He received FOLFOX chemotherapy from 07/22/2013 until 01/06/2014.    Chest, abdomen, and pelvic CT on 01/12/2016 revealed no evidence of recurrent or metastatic carcinoma.  Abdomen and pelvic CT with and without contrast on 09/13/2017 revealed no multiple simple renal cysts.  Liver was unremarkable.  Colonoscopy on 04/12/2014 revealed several polyps.  There was no dysplasia or malignancy.  Colonoscopy on 12/11/2016 revealed 10 polyps (2-8 mm) in the ascending proximal transverse, descending, proximal sigmoid and rectum.  Pathology revealed multiple tubular adenomas without dysplasia or malignancy.  He was diagnosed with pyelonephritis.  He is undergoing evaluation for hematuria.  CT scan was negative.  Cystoscopy on 10/03/2017 revealed prostate enlargement and no evidence of bladder pathology.  # He has a mild pancytopenia.  He received a course of Septra (08/26/2017 - 09/02/2017).  He has a history of a mild normocytic anemia.  Diet is modest.  He denies herbal products.  He denies any melena or hematochezia.  Work-up on 09/27/2017 revealed a hematocrit 31.1, hemoglobin 10.6, MCV 94, platelets 141,000,  WBC 2700 with an ANC of 1600.  Creatinine was 1.32.  He has B12 deficiency.  B12 was low (149).  Anti-parietal cell antibody and intrinsic factor antibody were negative.   Negative studies included:  folate (12.3), TSH, hepatitis B core antibody total, and hepatitis C antibody. Retic was 1.8%.  Ferritin was 103 with a iron saturation of 25% and a TIBC of 201.   Creatinine was 1.32.  He has B12 deficiency.  B12 was 149 on 09/27/2017 then 322 on 11/04/2017 and 227 on 12/05/2017 on oral B12.  Echocardiogram revealed an EF was 20-25% on 02/18/2015.  Cardiac catheterization on 02/24/2015 revealed 80% occlusion of the proximal left circumflex.  He managed medically.   INTERVAL HISTORY Christopher Noteboom. is a 73 y.o. male who has above history reviewed by me today presents for follow up visit for management of history of colon cancer and vitamin B12 deficiency Problems and complaints are listed below:  Patient reports feeling well today.  Denies any nausea vomiting, abdominal pain, change of bowel movements, blood in the stool. Patient has history of vitamin B12 deficiency, he has been on parenteral B12 injections monthly. Denies any bleeding, fever, chills, unintentional weight loss. His appetite is good. Recently he was seen by PCP for left leg wound. There was no signs of infection. He was recommended to continue monitor the wound.    Past Medical History:  Diagnosis Date  . Acute renal failure (ARF) ([place]) 08/14/2017  . Anemia   . CAD (coronary artery disease)    a. 02/2015 Cath: LM nl, LAD 50/40 mid/distal, LCX 80p, RCA 40p, 30d, RPL1 40, CO 3.27, CI 1.65; b. cath 06/09/15: LM minor irregs, m-dLAD 50%, dLAD  40%, pLCx 80% s/p PCI/DES 0%, pRCA 40%, dRCA 30%, 1st RPLB 40%  . Chronic systolic CHF (congestive heart failure) ([place])    a. 02/2015 Echo: EF 20-25%, diff HK, mod MR, mildy dil LA, mildly dil RV w/ mod RV syst dysfxn, mildly dil RA, mod TR, PASP 67mHg; b. 09/2017 Echo: EF 40-45%, diff HK. Gr1 DD.  Mild to mod MR. Mildly to mod dil LA.  . Colon cancer ([place]    a. 2015 s/p colectomy followed by chemoRx with oxaliplatin & fluorouacil.  . Dia hypertension 07/20/2014  . Diabetes mellitus without complication ([place]   . Hypertension   . Hypertensive heart disease   . Mixed Ischemic and Non-ischemic Cardiomyopathy    a. 02/2015 Echo: EF 20-25%, diff HK; b. 09/2016 Echo: EF 40-45%.  . Moderate mitral regurgitation    a. 09/2017 Echo: Mild to mod TR.  . Tubular adenoma of colon     Past Surgical History:  Procedure Laterality Date  . CARDIAC CATHETERIZATION Bilateral 02/24/2015   Procedure: Right/Left Heart Cath and Coronary Angiography;  Surgeon: MWellington Hampshire MD;  [facility];  Service: Cardiovascular;  Laterality: Bilateral;  . CARDIAC CATHETERIZATION N/A 06/09/2015   Procedure: Coronary Stent Intervention;  Surgeon: MWellington Hampshire MD;  [facility];  Service: Cardiovascular;  Laterality: N/A;  . COLON SURGERY     Colectomy  . COLONOSCOPY    . COLONOSCOPY WITH PROPOFOL N/A 12/11/2016   Procedure: COLONOSCOPY WITH PROPOFOL;  Surgeon: SLollie Sails MD;  [facility];  Service: Endoscopy;  Laterality: N/A;  . ESOPHAGOGASTRODUODENOSCOPY    . PORT-A-CATH REMOVAL N/A 07/12/2014   Procedure: REMOVAL PORT-A-CATH;  Surgeon: WMolly Maduro MD;  Location: [facility];  Service: General;  Laterality: N/A;  . PORTACATH PLACEMENT Left    Family History  Family history unknown: Yes   Social History   Socioeconomic History  . Marital status: Married    Spouse name: Not on file  . Number of children: Not on file  . Years of education: Not on file  . Highest education level: Not on file  Occupational History  . Not on file  Social Needs  . Financial resource strain: Not on file  . Food insecurity:    Worry: Not on file    Inability: Not on file  . Transportation needs:    Medical: Not on file    Non-medical: Not on file  Tobacco Use   . Smoking status: Never Smoker  . Smokeless tobacco: Never Used  Substance and Sexual Activity  . Alcohol use: No  . Drug use: No  . Sexual activity: Yes  Lifestyle  . Physical activity:    Days per week: Not on file    Minutes per session: Not on file  . Stress: Not on file  Relationships  . Social connections:    Talks on phone: Not on file    Gets together: Not on file    Attends religious service: Not on file    Active member of club or organization: Not on file    Attends meetings of clubs or organizations: Not on file    Relationship status: Not on file  . Intimate partner violence:    Fear of current or ex partner: Not on file    Emotionally abused: Not on file    Physically abused: Not on file    Forced sexual activity: Not on file  Other Topics Concern  . Not on file  Social History Narrative  . Not on file    He has retired from the city.  The patient lives in [place].  Allergies:  Allergies  Allergen Reactions  . Shellfish Allergy Other (See Comments)    Pt. instructed by MD to avoid seafood    Current Medications: Current Outpatient Medications  Medication Sig Dispense Refill  . aspirin EC 81 MG tablet Take 1 tablet (81 mg total) by mouth daily. 90 tablet 3  . atorvastatin (LIPITOR) 40 MG tablet TAKE 1 TABLET BY MOUTH ONCE DAILY 90 tablet 2  . carvedilol (COREG) 3.125 MG tablet take 1 tablet by mouth twice a day 180 tablet 2  . gabapentin (NEURONTIN) 100 MG capsule Take 2 capsules (200 mg total) by mouth 3 (three) times daily. 180 capsule 0  . losartan (COZAAR) 50 MG tablet Take 1 tablet (50 mg total) by mouth daily. 90 tablet 3  . metFORMIN (GLUCOPHAGE) 500 MG tablet Take 1 tablet (500 mg total) by mouth 2 (two) times daily with a meal. 180 tablet 3  . tamsulosin (FLOMAX) 0.4 MG CAPS capsule Take 1 capsule (0.4 mg total) by mouth daily after supper. 30 capsule 11   No current facility-administered medications for this visit.     Review of Systems   Constitutional: Negative for chills, fever, malaise/fatigue and weight loss.  HENT: Negative for sore throat.   Eyes: Negative for redness.  Respiratory: Negative for cough, shortness of breath and wheezing.   Cardiovascular: Negative for chest pain, palpitations and leg swelling.  Gastrointestinal: Negative for abdominal pain, blood in stool, nausea and vomiting.  Genitourinary: Negative for dysuria.  Musculoskeletal: Negative for myalgias.  Skin: Negative for rash.       Left lower extremity skin wound, chronic  Neurological: Negative for dizziness, tingling and tremors.  Endo/Heme/Allergies: Does not bruise/bleed easily.  Psychiatric/Behavioral: Negative for hallucinations.   Performance status (ECOG): 0  Vital Signs: BP (!) 147/83   Pulse 64   Wt 184 lb (83.5 kg)   BMI 27.17 kg/m   Physical Exam  Constitutional: He is oriented to person, place, and time. No distress.  HENT:  Head: Normocephalic and atraumatic.  Nose: Nose normal.  Mouth/Throat: Oropharynx is clear and moist. No oropharyngeal exudate.  Eyes: Pupils are equal, round, and reactive to light. EOM are normal. No scleral icterus.  Neck: Normal range of motion. Neck supple.  Cardiovascular: Normal rate and regular rhythm.  No murmur heard. Pulmonary/Chest: Effort normal. No respiratory distress. He has no rales. He exhibits no tenderness.  Abdominal: Soft. He exhibits no distension. There is no abdominal tenderness.  Musculoskeletal: Normal range of motion.     Comments: Chronic lower extremity edema +1  Neurological: He is alert and oriented to person, place, and time.  Skin: Skin is warm and dry. He is not diaphoretic.  Chronic dermatological changes due to vein insufficiency  Left shin small hypopigmented area, 2 small open skin wound, no erythema or draiange  Psychiatric: Affect and judgment normal.   RADIOGRAPHIC STUDIES: I have personally reviewed the radiological images as listed and agreed with the  findings in the report.01/11/2014:  Chest, abdomen, and pelvic CT revealed no evidence of metastatic disease. He was noted to have stable splenomegaly. There was some progression of tree-in-bud changes in the lungs, questionable MAC infection. There were chronic inflammatory changes. 01/12/2015:  Chest, abdomen, and pelvic CT revealed no evidence of metastatic disease in the chest, abdomen or pelvis.  There was a solitary 4 mm right  upper lobe pulmonary nodule (stable). There was mild cardiomegaly (increased).  There was mild patchy ground-glass opacity and interlobular septal thickening throughout both lungs, suggestive of pulmonary edema, concerning for developing congestive heart failure.  There was stable dilated main pulmonary artery, suggesting pulmonary arterial hypertension.  There was left main and 3 vessel coronary atherosclerosis.   01/12/2016:  Chest, abdomen, and pelvic CT revealed no evidence of recurrent or metastatic carcinoma.  There was cholelithiasis without radiographic evidence of cholecystitis.  There was aortic and coronary artery atherosclerosis. 08/15/2017:  Abdomen and pelvic CT without contrast revealed mild left hydronephroureter and bilateral renal hypodense lesions.   08/16/2017:  Abdomen MRI revealed abnormal appearance of the left kidney with striated appearance suggesting edema/infiltration, mild asymmetric left perirenal stranding, and heterogeneous debris in the left collecting system suspicious for pyelonephritis.   09/13/2017:  Abdomen and pelvic CT with and without contrast revealed no definite source for hematuria.  There were multiple simple cysts. No liver lesions.   Orders Only on 05/07/2018  Component Date Value Ref Range Status  . Vitamin B-12 05/07/2018 371  180 - 914 pg/mL Final   Comment: (NOTE) This assay is not validated for testing neonatal or myeloproliferative syndrome specimens for Vitamin B12 levels. Performed at [facility], [place] [address], [place], [place] [address]   . Sodium 05/07/2018 140  135 - 145 mmol/L Final  . Potassium 05/07/2018 3.8  3.5 - 5.1 mmol/L Final  . Chloride 05/07/2018 110  98 - 111 mmol/L Final  . CO2 05/07/2018 24  22 - 32 mmol/L Final  . Glucose, Bld 05/07/2018 146* 70 - 99 mg/dL Final  . BUN 05/07/2018 17  8 - 23 mg/dL Final  . Creatinine, Ser 05/07/2018 1.41* 0.61 - 1.24 mg/dL Final  . Calcium 05/07/2018 8.4* 8.9 - 10.3 mg/dL Final  . Total Protein 05/07/2018 6.7  6.5 - 8.1 g/dL Final  . Albumin 05/07/2018 3.3* 3.5 - 5.0 g/dL Final  . AST 05/07/2018 21  15 - 41 U/L Final  . ALT 05/07/2018 25  0 - 44 U/L Final  . Alkaline Phosphatase 05/07/2018 108  38 - 126 U/L Final  . Total Bilirubin 05/07/2018 1.2  0.3 - 1.2 mg/dL Final  . GFR calc non Af Amer 05/07/2018 50* >60 mL/min Final  . GFR calc Af Amer 05/07/2018 58* >60 mL/min Final  . Anion gap 05/07/2018 6  5 - 15 Final   Performed at [facility], [address] [place], [place] [address]  . WBC 05/07/2018 2.8* 4.0 - 10.5 K/uL Final  . RBC 05/07/2018 3.66* 4.22 - 5.81 MIL/uL Final  . Hemoglobin 05/07/2018 11.4* 13.0 - 17.0 g/dL Final  . HCT 05/07/2018 35.3* 39.0 - 52.0 % Final  . MCV 05/07/2018 96.4  80.0 - 100.0 fL Final  . MCH 05/07/2018 31.1  26.0 - 34.0 pg Final  . MCHC 05/07/2018 32.3  30.0 - 36.0 g/dL Final  . RDW 05/07/2018 15.6* 11.5 - 15.5 % Final  . Platelets 05/07/2018 136* 150 - 400 K/uL Final  . nRBC 05/07/2018 0.0  0.0 - 0.2 % Final  . Neutrophils Relative % 05/07/2018 63  % Final  . Neutro Abs 05/07/2018 1.8  1.7 - 7.7 K/uL Final  . Lymphocytes Relative 05/07/2018 19  % Final  . Lymphs Abs 05/07/2018 0.5* 0.7 - 4.0 K/uL Final  . Monocytes Relative 05/07/2018 15  % Final  . Monocytes Absolute 05/07/2018 0.4  0.1 - 1.0 K/uL Final  .  Eosinophils Relative 05/07/2018 2  % Final  . Eosinophils Absolute 05/07/2018 0.1  0.0 - 0.5 K/uL Final  . Basophils Relative 05/07/2018 1  % Final  . Basophils Absolute 05/07/2018 0.0  0.0  - 0.1 K/uL Final  . Immature Granulocytes 05/07/2018 0  % Final  . Abs Immature Granulocytes 05/07/2018 0.00  0.00 - 0.07 K/uL Final   Performed at [facility], [address] [place], [place] [address]    CEA has been followed:  4.3 on 06/20/2013, 1.8 on 01/06/2014, 1.5 on 09/16/2014, 1.1 on 01/14/2015, 1.5 on 05/16/2015, 1.6 on 09/12/2015, 1.3 on 01/20/2016, 1.6 on 08/24/2016, 1.2 on 01/09/2017, and 1.1 on 09/27/2017.  Assessment:  Christopher Buttram. is a 73 y.o. male follows up for management of pancytopenia, vitamin B12 deficiency and stage IIIc colon cancer. 1. Malignant neoplasm of sigmoid colon ([place])   2. B12 deficiency   3. Thrombocytopenia ([place])   4. Anemia, unspecified type   5. Leukopenia, unspecified type    # Stage IIIC colon cancer: Patient's been doing well.  No symptoms.  CEA stable at 1.1 on 23 2019.  Colonoscopy in November 2018 showed multiple colon polyps.  Follow-up with gastroenterology Dr. Gustavo Lah.   CEA has been followed.  CEA is pending today. I would recommend obtaining CT chest abdomen pelvis without contrast in 3 months. #Pancytopenia, this has been a chronic problem for him.  Counts remained stable.  Etiology is likely multifactorial due to anemia of chronic disease, hematuria, vitamin deficiencies and the possible underlying bone marrow disorders secondary to previous chemotherapy. Continue monitor.  #Vitamin B12 deficiency, not responding to oral B12 supplements.  Currently on monthly vitamin B12 injections.  Orders Placed This Encounter  Procedures  . CT Abdomen Pelvis Wo Contrast    Standing Status:   Future    Standing Expiration Date:   05/08/2019    Order Specific Question:   ** REASON FOR EXAM (FREE TEXT)    Answer:   history of colon cancer. follow up    Order Specific Question:   Preferred imaging location?    Answer:   [facility]    Order Specific Question:   Is Oral Contrast requested for this exam?    Answer:   Yes, Per  Radiology protocol    Order Specific Question:   Radiology Contrast Protocol - do NOT remove file path    Answer:   \\[email or web address]  . CT Chest Wo Contrast    Standing Status:   Future    Standing Expiration Date:   05/08/2019    Order Specific Question:   ** REASON FOR EXAM (FREE TEXT)    Answer:   history of colon cancer. follow up    Order Specific Question:   Preferred imaging location?    Answer:   [facility]    Order Specific Question:   Radiology Contrast Protocol - do NOT remove file path    Answer:   \\[email or web address]  . CEA    Standing Status:   Future    Number of Occurrences:   1    Standing Expiration Date:   05/08/2019  . CBC with Differential/Platelet    Standing Status:   Future    Standing Expiration Date:   05/08/2019  . Vitamin B12    Standing Status:   Future    Standing Expiration Date:   05/08/2019  . Comprehensive metabolic panel    Standing Status:   Future    Standing Expiration Date:  05/08/2019  . CEA    Standing Status:   Future    Standing Expiration Date:   05/08/2019  = RTC  monthly Vitamin B12 injections, 3 months for follow-up assessment.  Check CBC, CMP, CEA, vitamin B12   Earlie Server, MD, PhD Hematology Oncology [facility] at [facility] Pager- [phone] 05/08/2018

## 2018-05-09 LAB — CEA: CEA: 1.2 ng/mL (ref 0.0–4.7)

## 2018-05-19 ENCOUNTER — Other Ambulatory Visit: Payer: Self-pay | Admitting: Cardiovascular Disease

## 2018-06-02 ENCOUNTER — Other Ambulatory Visit: Payer: Self-pay

## 2018-06-02 ENCOUNTER — Inpatient Hospital Stay: Payer: PPO

## 2018-06-02 DIAGNOSIS — E538 Deficiency of other specified B group vitamins: Secondary | ICD-10-CM

## 2018-06-02 DIAGNOSIS — C187 Malignant neoplasm of sigmoid colon: Secondary | ICD-10-CM | POA: Diagnosis not present

## 2018-06-02 MED ORDER — CYANOCOBALAMIN 1000 MCG/ML IJ SOLN
1000.0000 ug | Freq: Once | INTRAMUSCULAR | Status: AC
  Administered 2018-06-02: 1000 ug via INTRAMUSCULAR

## 2018-07-01 ENCOUNTER — Other Ambulatory Visit: Payer: Self-pay

## 2018-07-01 ENCOUNTER — Inpatient Hospital Stay: Payer: PPO | Attending: Oncology

## 2018-07-01 DIAGNOSIS — E538 Deficiency of other specified B group vitamins: Secondary | ICD-10-CM | POA: Diagnosis not present

## 2018-07-01 MED ORDER — CYANOCOBALAMIN 1000 MCG/ML IJ SOLN
1000.0000 ug | Freq: Once | INTRAMUSCULAR | Status: AC
  Administered 2018-07-01: 1000 ug via INTRAMUSCULAR
  Filled 2018-07-01: qty 1

## 2018-07-29 ENCOUNTER — Inpatient Hospital Stay: Payer: PPO | Attending: Oncology

## 2018-07-29 ENCOUNTER — Other Ambulatory Visit: Payer: Self-pay

## 2018-07-29 DIAGNOSIS — E538 Deficiency of other specified B group vitamins: Secondary | ICD-10-CM | POA: Diagnosis not present

## 2018-07-29 MED ORDER — CYANOCOBALAMIN 1000 MCG/ML IJ SOLN
1000.0000 ug | Freq: Once | INTRAMUSCULAR | Status: AC
  Administered 2018-07-29: 1000 ug via INTRAMUSCULAR
  Filled 2018-07-29: qty 1

## 2018-08-07 ENCOUNTER — Ambulatory Visit
Admission: RE | Admit: 2018-08-07 | Discharge: 2018-08-07 | Disposition: A | Discharge status: Home / Self Care | Payer: PPO | Source: Ambulatory Visit | Attending: Oncology | Admitting: Oncology

## 2018-08-07 ENCOUNTER — Other Ambulatory Visit: Payer: Self-pay

## 2018-08-07 DIAGNOSIS — C187 Malignant neoplasm of sigmoid colon: Secondary | ICD-10-CM

## 2018-08-07 DIAGNOSIS — J90 Pleural effusion, not elsewhere classified: Secondary | ICD-10-CM | POA: Diagnosis not present

## 2018-08-07 DIAGNOSIS — N281 Cyst of kidney, acquired: Secondary | ICD-10-CM | POA: Diagnosis not present

## 2018-08-11 ENCOUNTER — Inpatient Hospital Stay: Payer: PPO | Attending: Oncology

## 2018-08-11 ENCOUNTER — Other Ambulatory Visit: Payer: Self-pay

## 2018-08-11 ENCOUNTER — Inpatient Hospital Stay (HOSPITAL_BASED_OUTPATIENT_CLINIC_OR_DEPARTMENT_OTHER): Payer: PPO | Admitting: Oncology

## 2018-08-11 ENCOUNTER — Encounter: Payer: Self-pay | Admitting: Oncology

## 2018-08-11 VITALS — BP 143/76 | HR 56 | Temp 96.7°F | Resp 18 | Wt 178.8 lb

## 2018-08-11 DIAGNOSIS — Z85038 Personal history of other malignant neoplasm of large intestine: Secondary | ICD-10-CM

## 2018-08-11 DIAGNOSIS — D649 Anemia, unspecified: Secondary | ICD-10-CM | POA: Insufficient documentation

## 2018-08-11 DIAGNOSIS — E538 Deficiency of other specified B group vitamins: Secondary | ICD-10-CM

## 2018-08-11 DIAGNOSIS — D72819 Decreased white blood cell count, unspecified: Secondary | ICD-10-CM | POA: Insufficient documentation

## 2018-08-11 DIAGNOSIS — D61818 Other pancytopenia: Secondary | ICD-10-CM | POA: Insufficient documentation

## 2018-08-11 DIAGNOSIS — C187 Malignant neoplasm of sigmoid colon: Secondary | ICD-10-CM

## 2018-08-11 DIAGNOSIS — D696 Thrombocytopenia, unspecified: Secondary | ICD-10-CM | POA: Diagnosis not present

## 2018-08-11 LAB — CBC WITH DIFFERENTIAL/PLATELET
Abs Immature Granulocytes: 0 10*3/uL (ref 0.00–0.07)
Basophils Absolute: 0 10*3/uL (ref 0.0–0.1)
Basophils Relative: 1 %
Eosinophils Absolute: 0.2 10*3/uL (ref 0.0–0.5)
Eosinophils Relative: 7 %
HCT: 34.8 % — ABNORMAL LOW (ref 39.0–52.0)
Hemoglobin: 11.3 g/dL — ABNORMAL LOW (ref 13.0–17.0)
Immature Granulocytes: 0 %
Lymphocytes Relative: 15 %
Lymphs Abs: 0.4 10*3/uL — ABNORMAL LOW (ref 0.7–4.0)
MCH: 32.1 pg (ref 26.0–34.0)
MCHC: 32.5 g/dL (ref 30.0–36.0)
MCV: 98.9 fL (ref 80.0–100.0)
Monocytes Absolute: 0.3 10*3/uL (ref 0.1–1.0)
Monocytes Relative: 12 %
Neutro Abs: 1.9 10*3/uL (ref 1.7–7.7)
Neutrophils Relative %: 65 %
Platelets: 131 10*3/uL — ABNORMAL LOW (ref 150–400)
RBC: 3.52 MIL/uL — ABNORMAL LOW (ref 4.22–5.81)
RDW: 17.1 % — ABNORMAL HIGH (ref 11.5–15.5)
WBC: 2.9 10*3/uL — ABNORMAL LOW (ref 4.0–10.5)
nRBC: 0 % (ref 0.0–0.2)

## 2018-08-11 LAB — COMPREHENSIVE METABOLIC PANEL
ALT: 24 U/L (ref 0–44)
AST: 19 U/L (ref 15–41)
Albumin: 3.6 g/dL (ref 3.5–5.0)
Alkaline Phosphatase: 113 U/L (ref 38–126)
Anion gap: 8 (ref 5–15)
BUN: 17 mg/dL (ref 8–23)
CO2: 24 mmol/L (ref 22–32)
Calcium: 8.9 mg/dL (ref 8.9–10.3)
Chloride: 112 mmol/L — ABNORMAL HIGH (ref 98–111)
Creatinine, Ser: 1.31 mg/dL — ABNORMAL HIGH (ref 0.61–1.24)
GFR calc Af Amer: >60 mL/min (ref >60)
GFR calc non Af Amer: 54 mL/min — ABNORMAL LOW (ref >60)
Glucose, Bld: 115 mg/dL — ABNORMAL HIGH (ref 70–99)
Potassium: 4.2 mmol/L (ref 3.5–5.1)
Sodium: 144 mmol/L (ref 135–145)
Total Bilirubin: 1.4 mg/dL — ABNORMAL HIGH (ref 0.3–1.2)
Total Protein: 6.9 g/dL (ref 6.5–8.1)

## 2018-08-11 LAB — VITAMIN B12: Vitamin B-12: 503 pg/mL (ref 180–914)

## 2018-08-11 NOTE — Progress Notes (Signed)
[name]  Chief Complaint: Christopher No. is a 73 y.o. male with a history of stage IIIC colon cancer, pancytopenia, and B12 deficiency who presented to follow-up for management of history of colon cancer and vitamin B12 deficiency.   PERTINENT ONCOLOGY HISTORY Patient previous oncology care was with Dr. Mike Gip who has moved practice to [facility]. Patient switches care to me on 02/10/2018  # stage IIIC colon cancer.  He presented with symptomatic anemia.  He underwent descending and proximal sigmoid colectomy, left ureteral lysis, and partial cecectomy on 06/23/2013. Six of 22 lymph nodes were positive.  There were tumor deposits (discontinuous extramural extension).  There was lymphovascular and perineural invasion. Pathologic stage was IIIC (T4bN2a M0). He received FOLFOX chemotherapy from 07/22/2013 until 01/06/2014.    Chest, abdomen, and pelvic CT on 01/12/2016 revealed no evidence of recurrent or metastatic carcinoma.  Abdomen and pelvic CT with and without contrast on 09/13/2017 revealed no multiple simple renal cysts.  Liver was unremarkable.  Colonoscopy on 04/12/2014 revealed several polyps.  There was no dysplasia or malignancy.  Colonoscopy on 12/11/2016 revealed 10 polyps (2-8 mm) in the ascending proximal transverse, descending, proximal sigmoid and rectum.  Pathology revealed multiple tubular adenomas without dysplasia or malignancy.  He was diagnosed with pyelonephritis.  He is undergoing evaluation for hematuria.  CT scan was negative.  Cystoscopy on 10/03/2017 revealed prostate enlargement and no evidence of bladder pathology.  # He has a mild pancytopenia.  He received a course of Septra (08/26/2017 - 09/02/2017).  He has a history of a mild normocytic anemia.  Diet is modest.  He denies herbal products.  He denies any melena or hematochezia.  Work-up on 09/27/2017 revealed a hematocrit 31.1, hemoglobin 10.6, MCV 94, platelets 141,000,  WBC 2700 with an ANC of 1600.  Creatinine was 1.32.  He has B12 deficiency.  B12 was low (149).  Anti-parietal cell antibody and intrinsic factor antibody were negative.   Negative studies included:  folate (12.3), TSH, hepatitis B core antibody total, and hepatitis C antibody. Retic was 1.8%.  Ferritin was 103 with a iron saturation of 25% and a TIBC of 201.   Creatinine was 1.32.  He has B12 deficiency.  B12 was 149 on 09/27/2017 then 322 on 11/04/2017 and 227 on 12/05/2017 on oral B12.  Echocardiogram revealed an EF was 20-25% on 02/18/2015.  Cardiac catheterization on 02/24/2015 revealed 80% occlusion of the proximal left circumflex.  He managed medically.   INTERVAL HISTORY [name]. is a 73 y.o. male who has above history reviewed by me today presents for follow up visit for management of history of colon cancer and vitamin B12 deficiency Problems and complaints are listed below: Patient reports feeling well at baseline today. Denies any nausea, vomiting, abdominal pain or change of bowel movement habits.  Denies any blood in the stool.  History of vitamin B12 deficiency, he has been on parenteral B12 injections monthly.  Last injection was last week. Appetite is good.  Left lower extremity wound is better.   Past Medical History:  Diagnosis Date  . Acute renal failure (ARF) ([place]) 08/14/2017  . Anemia   . CAD (coronary artery disease)    a. 02/2015 Cath: LM nl, LAD 50/40 mid/distal, LCX 80p, RCA 40p, 30d, RPL1 40, CO 3.27, CI 1.65; b. cath 06/09/15: LM minor irregs, m-dLAD 50%, dLAD 40%, pLCx 80% s/p PCI/DES 0%, pRCA 40%, dRCA 30%, 1st RPLB 40%  . Chronic systolic  CHF (congestive heart failure) ([place])    a. 02/2015 Echo: EF 20-25%, diff HK, mod MR, mildy dil LA, mildly dil RV w/ mod RV syst dysfxn, mildly dil RA, mod TR, PASP 27mHg; b. 09/2017 Echo: EF 40-45%, diff HK. Gr1 DD. Mild to mod MR. Mildly to mod dil LA.  . [place]    a. 2015 s/p colectomy followed by chemoRx  with oxaliplatin & fluorouacil.  . Dia hypertension 07/20/2014  . Diabetes mellitus without complication ([place]   . Hypertension   . Hypertensive heart disease   . Mixed Ischemic and Non-ischemic Cardiomyopathy    a. 02/2015 Echo: EF 20-25%, diff HK; b. 09/2016 Echo: EF 40-45%.  . Moderate mitral regurgitation    a. 09/2017 Echo: Mild to mod TR.  . Tubular adenoma of colon     Past Surgical History:  Procedure Laterality Date  . CARDIAC CATHETERIZATION Bilateral 02/24/2015   Procedure: Right/Left Heart Cath and Coronary Angiography;  Surgeon: MWellington Hampshire MD;  [facility];  Service: Cardiovascular;  Laterality: Bilateral;  . CARDIAC CATHETERIZATION N/A 06/09/2015   Procedure: Coronary Stent Intervention;  Surgeon: MWellington Hampshire MD;  [facility];  Service: Cardiovascular;  Laterality: N/A;  . COLON SURGERY     Colectomy  . COLONOSCOPY    . COLONOSCOPY WITH PROPOFOL N/A 12/11/2016   Procedure: COLONOSCOPY WITH PROPOFOL;  Surgeon: SLollie Sails MD;  [facility];  Service: Endoscopy;  Laterality: N/A;  . ESOPHAGOGASTRODUODENOSCOPY    . PORT-A-CATH REMOVAL N/A 07/12/2014   Procedure: REMOVAL PORT-A-CATH;  Surgeon: WMolly Maduro MD;  Location: [facility];  Service: General;  Laterality: N/A;  . PORTACATH PLACEMENT Left    Family History  Family history unknown: Yes   Social History   Socioeconomic History  . Marital status: Married    Spouse name: Not on file  . Number of children: Not on file  . Years of education: Not on file  . Highest education level: Not on file  Occupational History  . Not on file  Social Needs  . Financial resource strain: Not on file  . Food insecurity    Worry: Not on file    Inability: Not on file  . Transportation needs    Medical: Not on file    Non-medical: Not on file  Tobacco Use  . Smoking status: Never Smoker  . Smokeless tobacco: Never Used  Substance and Sexual Activity  .  Alcohol use: No  . Drug use: No  . Sexual activity: Yes  Lifestyle  . Physical activity    Days per week: Not on file    Minutes per session: Not on file  . Stress: Not on file  Relationships  . Social [place]: Not on file    Gets together: Not on file    Attends religious service: Not on file    Active member of club or organization: Not on file    Attends meetings of clubs or organizations: Not on file    Relationship status: Not on file  . Intimate partner violence    Fear of current or ex partner: Not on file    Emotionally abused: Not on file    Physically abused: Not on file    Forced sexual activity: Not on file  Other Topics Concern  . Not on file  Social History Narrative  . Not on file    Allergies:  Allergies  Allergen  Reactions  . Shellfish Allergy Other (See Comments)    Pt. instructed by MD to avoid seafood    Current Medications: Current Outpatient Medications  Medication Sig Dispense Refill  . aspirin EC 81 MG tablet Take 1 tablet (81 mg total) by mouth daily. 90 tablet 3  . atorvastatin (LIPITOR) 40 MG tablet TAKE 1 TABLET BY MOUTH ONCE DAILY 90 tablet 2  . carvedilol (COREG) 3.125 MG tablet TAKE 1 TABLET BY MOUTH TWICE A DAY 180 tablet 0  . gabapentin (NEURONTIN) 100 MG capsule Take 2 capsules (200 mg total) by mouth 3 (three) times daily. 180 capsule 0  . metFORMIN (GLUCOPHAGE) 500 MG tablet Take 1 tablet (500 mg total) by mouth 2 (two) times daily with a meal. 180 tablet 3  . tamsulosin (FLOMAX) 0.4 MG CAPS capsule Take 1 capsule (0.4 mg total) by mouth daily after supper. 30 capsule 11  . losartan (COZAAR) 50 MG tablet Take 1 tablet (50 mg total) by mouth daily. 90 tablet 3   No current facility-administered medications for this visit.     Review of Systems  Constitutional: Negative for chills, fever, malaise/fatigue and weight loss.  HENT: Negative for sore throat.   Eyes: Negative for redness.  Respiratory: Negative for  cough, shortness of breath and wheezing.   Cardiovascular: Negative for chest pain, palpitations and leg swelling.  Gastrointestinal: Negative for abdominal pain, blood in stool, nausea and vomiting.  Genitourinary: Negative for dysuria.  Musculoskeletal: Negative for myalgias.  Skin: Negative for rash.       Left lower extremity skin wound, chronic  Neurological: Negative for dizziness, tingling and tremors.  Endo/Heme/Allergies: Does not bruise/bleed easily.  Psychiatric/Behavioral: Negative for hallucinations.   Performance status (ECOG): 0  Vital Signs: BP (!) 143/76 (BP Location: Left Arm)   Pulse (!) 56   Temp (!) 96.7 F (35.9 C)   Resp 18   Wt 178 lb 12.8 oz (81.1 kg)   BMI 26.40 kg/m   Physical Exam  Constitutional: He is oriented to person, place, and time. No distress.  HENT:  Head: Normocephalic and atraumatic.  Nose: Nose normal.  Mouth/Throat: Oropharynx is clear and moist. No oropharyngeal exudate.  Eyes: Pupils are equal, round, and reactive to light. EOM are normal. No scleral icterus.  Neck: Normal range of motion. Neck supple.  Cardiovascular: Normal rate and regular rhythm.  No murmur heard. Pulmonary/Chest: Effort normal. No respiratory distress. He has no rales. He exhibits no tenderness.  Abdominal: Soft. He exhibits no distension. There is no abdominal tenderness.  Musculoskeletal: Normal range of motion.        General: No edema.     Comments: Chronic lower extremity edema +1  Neurological: He is alert and oriented to person, place, and time. No cranial nerve deficit. He exhibits normal muscle tone. Coordination normal.  Skin: Skin is warm and dry. He is not diaphoretic. No erythema.  Chronic dermatological changes due to vein insufficiency  Psychiatric: Affect and judgment normal.   RADIOGRAPHIC STUDIES: I have personally reviewed the radiological images as listed and agreed with the findings in the report.01/11/2014:  Chest, abdomen, and pelvic  CT revealed no evidence of metastatic disease. He was noted to have stable splenomegaly. There was some progression of tree-in-bud changes in the lungs, questionable MAC infection. There were chronic inflammatory changes. 01/12/2015:  Chest, abdomen, and pelvic CT revealed no evidence of metastatic disease in the chest, abdomen or pelvis.  There was a solitary 4 mm right upper lobe  pulmonary nodule (stable). There was mild cardiomegaly (increased).  There was mild patchy ground-glass opacity and interlobular septal thickening throughout both lungs, suggestive of pulmonary edema, concerning for developing congestive heart failure.  There was stable dilated main pulmonary artery, suggesting pulmonary arterial hypertension.  There was left main and 3 vessel coronary atherosclerosis.   01/12/2016:  Chest, abdomen, and pelvic CT revealed no evidence of recurrent or metastatic carcinoma.  There was cholelithiasis without radiographic evidence of cholecystitis.  There was aortic and coronary artery atherosclerosis. 08/15/2017:  Abdomen and pelvic CT without contrast revealed mild left hydronephroureter and bilateral renal hypodense lesions.   08/16/2017:  Abdomen MRI revealed abnormal appearance of the left kidney with striated appearance suggesting edema/infiltration, mild asymmetric left perirenal stranding, and heterogeneous debris in the left collecting system suspicious for pyelonephritis.   09/13/2017:  CT hematuria work up revealed no definite source for hematuria.  There were multiple simple cysts. No liver lesions. 08/07/2018 CT chest abnormal pelvis; no evidence of metastatic disease in the chest abdomen or pelvis No finding of local tumor recurrence status post subtotal left colectomy. Cardiomegaly trace dependent bilateral pleural effusion.  Mild pulmonary edema three-vessel coronary atherosclerosis.  Labs CBC Latest Ref Rng & Units 08/11/2018 05/07/2018 02/10/2018  WBC 4.0 - 10.5 K/uL 2.9(L) 2.8(L) 3.1(L)   Hemoglobin 13.0 - 17.0 g/dL 11.3(L) 11.4(L) 10.2(L)  Hematocrit 39.0 - 52.0 % 34.8(L) 35.3(L) 32.0(L)  Platelets 150 - 400 K/uL 131(L) 136(L) 119(L)   CMP Latest Ref Rng & Units 08/11/2018 05/07/2018 04/28/2018  Glucose 70 - 99 mg/dL 115(H) 146(H) 128(H)  BUN 8 - 23 mg/dL _0 Creatinine 0.61 - 1.24 mg/dL 1.31(H) 1.41(H) 1.37  Sodium 135 - 145 mmol/L 144 140 142  Potassium 3.5 - 5.1 mmol/L 4.2 3.8 4.0  Chloride 98 - 111 mmol/L 112(H) 110 111  CO2 22 - 32 mmol/L _1 Calcium 8.9 - 10.3 mg/dL 8.9 8.4(L) 8.6  Total Protein 6.5 - 8.1 g/dL 6.9 6.7 -  Total Bilirubin 0.3 - 1.2 mg/dL 1.4(H) 1.2 -  Alkaline Phos 38 - 126 U/L 113 108 -  AST 15 - 41 U/L 19 21 -  ALT 0 - 44 U/L 24 25 -   CEA has been followed:  4.3 on 06/20/2013, 1.8 on 01/06/2014, 1.5 on 09/16/2014, 1.1 on 01/14/2015, 1.5 on 05/16/2015, 1.6 on 09/12/2015, 1.3 on 01/20/2016, 1.6 on 08/24/2016, 1.2 on 01/09/2017, and 1.1 on 09/27/2017.  Assessment:  Christopher Mcdermid. is a 73 y.o. male follows up for management of pancytopenia, vitamin B12 deficiency and stage IIIc colon cancer. 1. B12 deficiency   2. Malignant neoplasm of sigmoid colon ([place])   3. Thrombocytopenia ([place])   4. Leukopenia, unspecified type   5. Anemia, unspecified type    # Stage IIIC colon cancer:  Patient is doing well clinically.  No symptoms. CEA has been followed.  05/08/2018 CEA 1.2.  Today's CEA level is pending. CT chest abdomen pelvis without contrast images were independently reviewed by me and discussed with patient. No evidence of recurrence or distant metastasis. Continue follow-up with gastroenterology for colon polyp surveillance. Has been 5 years since surgery in 2015.    #Anemia/thrombocytoenia/leukopenia  Hemoglobin 11.3, stable, platelet count 131 stable.  Chronic lymphocytopenia. Counts remained stable.  Etiology is multifactorial, likely secondary to anemia of chronic disease, vitamin deficiency and possible underlying bone marrow  problems due to previous chemotherapy. Continue to monitor.  #Vitamin B12 deficiency,  Today's vitamin B12 level at 503.  Continue monthly vitamin B12 injections.  Orders Placed This Encounter  Procedures  . CBC with Differential/Platelet    Standing Status:   Future    Standing Expiration Date:   08/11/2019  . Comprehensive metabolic panel    Standing Status:   Future    Standing Expiration Date:   08/11/2019  . Vitamin B12    Standing Status:   Future    Standing Expiration Date:   08/11/2019  . CEA    Standing Status:   Future    Standing Expiration Date:   08/11/2019  = RTC  monthly Vitamin B12 injections, 6 months for follow-up assessment.  Check CBC, CMP, CEA, vitamin B12   Earlie Server, MD, PhD Hematology Oncology [facility] Pager- [phone] 08/11/2018

## 2018-08-11 NOTE — Progress Notes (Signed)
Patient here for follow up. No concerns voiced.  °

## 2018-08-12 LAB — CEA: CEA: 2.2 ng/mL (ref 0.0–4.7)

## 2018-08-12 NOTE — Progress Notes (Signed)
02/13/2018 3:09 PM   [name] [birth year] [id number]  Referring provider: Leone Haven, MD [address] [place],  [place] [address]  Chief Complaint  Patient presents with  . Hematuria    HPI: Patient is a 73 -year-old male with a history of hematuria, BPH with LUTS and a prostate nodule who returns today for a 6 months follow up.  History of hematuria (high risk) Non-smoker.  CTU in 09/2017 noted no definite source for hematuria identified on today's examination. Specifically, no urinary tract calculi and no findings of urinary tract obstruction. In addition to multiple simple cysts there are several subcentimeter low-attenuation lesions in the kidneys bilaterally. These are too small to definitively characterize, but are favored to represent small cysts. Should the patient's hematuria persist or worsen, these could be definitively characterized with MRI of the abdomen with and without IV gadolinium if clinically appropriate.  Cholelithiasis without evidence of acute cholecystitis at this time.  Aortic atherosclerosis, in addition to at least 2 vessel coronary artery disease. Assessment for potential risk factor modification, dietary therapy or pharmacologic therapy may be warranted, if clinically indicated.   Cardiomegaly.  Bilateral hydroceles incompletely imaged (left greater than right).  Aortic Atherosclerosis (ICD10-I70.0).  Non-contrast CT on 08/07/2018 noted normal adrenals. No renal stones. No hydronephrosis. Simple 3.1 cm posterior lower right renal cyst.  Simple 1.5 cm posterior lower left renal cyst. No additional contour deforming renal lesions. Chronic mild diffuse bladder wall thickening. Bladder is nondistended.  Cystoscopy with Dr. Bernardo Heater in 09/2017 noted an enlarged prostate.  He does not report any gross hematuria. UA today is negative.    BPH WITH LUTS  (prostate and/or bladder) IPSS score: 3/1     Major complaint(s): No complaints at this  time.  Denies any dysuria, hematuria or suprapubic pain.   Currently taking: tamsulosin 0.4 mg daily  Denies any recent fevers, chills, nausea or vomiting.    IPSS    Row Name 08/13/18 1400         International Prostate Symptom Score   How often have you had the sensation of not emptying your bladder?  Not at All     How often have you had to urinate less than every two hours?  Not at All     How often have you found you stopped and started again several times when you urinated?  Not at All     How often have you found it difficult to postpone urination?  Not at All     How often have you had a weak urinary stream?  Not at All     How often have you had to strain to start urination?  Not at All     How many times did you typically get up at night to urinate?  3 Times     Total IPSS Score  3       Quality of Life due to urinary symptoms   If you were to spend the rest of your life with your urinary condition just the way it is now how would you feel about that?  Pleased        Score:  1-7 Mild 8-19 Moderate 20-35 Severe     PMH: Past Medical History:  Diagnosis Date  . Acute renal failure (ARF) ([place]) 08/14/2017  . Anemia   . CAD (coronary artery disease)    a. 02/2015 Cath: LM nl, LAD 50/40 mid/distal, LCX 80p, RCA 40p, 30d,  RPL1 40, CO 3.27, CI 1.65; b. cath 06/09/15: LM minor irregs, m-dLAD 50%, dLAD 40%, pLCx 80% s/p PCI/DES 0%, pRCA 40%, dRCA 30%, 1st RPLB 40%  . Chronic systolic CHF (congestive heart failure) ([place])    a. 02/2015 Echo: EF 20-25%, diff HK, mod MR, mildy dil LA, mildly dil RV w/ mod RV syst dysfxn, mildly dil RA, mod TR, PASP 24mmHg; b. 09/2017 Echo: EF 40-45%, diff HK. Gr1 DD. Mild to mod MR. Mildly to mod dil LA.  . Colon cancer ([place])    a. 2015 s/p colectomy followed by chemoRx with oxaliplatin & fluorouacil.  . Dia hypertension 07/20/2014  . Diabetes mellitus without complication ([place])   . Hypertension   . Hypertensive heart disease   . Mixed  Ischemic and Non-ischemic Cardiomyopathy    a. 02/2015 Echo: EF 20-25%, diff HK; b. 09/2016 Echo: EF 40-45%.  . Moderate mitral regurgitation    a. 09/2017 Echo: Mild to mod TR.  . Tubular adenoma of colon     Surgical History: Past Surgical History:  Procedure Laterality Date  . CARDIAC CATHETERIZATION Bilateral 02/24/2015   Procedure: Right/Left Heart Cath and Coronary Angiography;  Surgeon: Wellington Hampshire, MD;  Location: [facility];  Service: Cardiovascular;  Laterality: Bilateral;  . CARDIAC CATHETERIZATION N/A 06/09/2015   Procedure: Coronary Stent Intervention;  Surgeon: Wellington Hampshire, MD;  Location: [facility];  Service: Cardiovascular;  Laterality: N/A;  . COLON SURGERY     Colectomy  . COLONOSCOPY    . COLONOSCOPY WITH PROPOFOL N/A 12/11/2016   Procedure: COLONOSCOPY WITH PROPOFOL;  Surgeon: Lollie Sails, MD;  Location: [facility];  Service: Endoscopy;  Laterality: N/A;  . ESOPHAGOGASTRODUODENOSCOPY    . PORT-A-CATH REMOVAL N/A 07/12/2014   Procedure: REMOVAL PORT-A-CATH;  Surgeon: Molly Maduro, MD;  Location: [facility];  Service: General;  Laterality: N/A;  . PORTACATH PLACEMENT Left     Home Medications:  Allergies as of 08/13/2018      Reactions   Shellfish Allergy Other (See Comments)   Pt. instructed by MD to avoid seafood      Medication List       Accurate as of August 13, 2018 11:59 PM. If you have any questions, ask your nurse or doctor.        aspirin EC 81 MG tablet Take 1 tablet (81 mg total) by mouth daily.   atorvastatin 40 MG tablet Commonly known as: LIPITOR TAKE 1 TABLET BY MOUTH ONCE DAILY   carvedilol 3.125 MG tablet Commonly known as: COREG TAKE 1 TABLET BY MOUTH TWICE A DAY   gabapentin 100 MG capsule Commonly known as: NEURONTIN Take 2 capsules (200 mg total) by mouth 3 (three) times daily.   losartan 50 MG tablet Commonly known as: COZAAR Take 1 tablet (50 mg total) by mouth daily.   metFORMIN 500 MG  tablet Commonly known as: GLUCOPHAGE Take 1 tablet (500 mg total) by mouth 2 (two) times daily with a meal.   tamsulosin 0.4 MG Caps capsule Commonly known as: FLOMAX Take 1 capsule (0.4 mg total) by mouth daily after supper.       Allergies:  Allergies  Allergen Reactions  . Shellfish Allergy Other (See Comments)    Pt. instructed by MD to avoid seafood    Family History: Family History  Problem Relation Age of Onset  . Cancer Mother     Social History:  reports that he has never smoked. He has never used smokeless tobacco.  He reports that he does not drink alcohol or use drugs.  ROS: UROLOGY Frequent Urination?: No Hard to postpone urination?: No Burning/pain with urination?: No Get up at night to urinate?: No Leakage of urine?: No Urine stream starts and stops?: No Trouble starting stream?: No Do you have to strain to urinate?: No Blood in urine?: No Urinary tract infection?: No Sexually transmitted disease?: No Injury to kidneys or bladder?: No Painful intercourse?: No Weak stream?: No Erection problems?: No Penile pain?: No  Gastrointestinal Nausea?: No Vomiting?: No Indigestion/heartburn?: No Diarrhea?: No Constipation?: No  Constitutional Fever: No Night sweats?: No Weight loss?: Yes Fatigue?: No  Skin Skin rash/lesions?: No Itching?: No  Eyes Blurred vision?: Yes Double vision?: No  Ears/Nose/Throat Sore throat?: Yes Sinus problems?: No  Hematologic/Lymphatic Swollen glands?: No Easy bruising?: No  Cardiovascular Leg swelling?: No Chest pain?: No  Respiratory Cough?: No Shortness of breath?: No  Endocrine Excessive thirst?: No  Musculoskeletal Back pain?: No Joint pain?: No  Neurological Headaches?: No Dizziness?: No  Psychologic Depression?: No Anxiety?: No  Physical Exam: BP (!) 159/74 (BP Location: Left Arm, Patient Position: Sitting, Cuff Size: Normal)   Pulse 60   Ht 5\' 9"  (1.753 m)   Wt 185 lb (83.9 kg)    BMI 27.32 kg/m   Constitutional:  Well nourished. Alert and oriented, No acute distress. HEENT: [place] AT, moist mucus membranes.  Trachea midline, no masses. Cardiovascular: No clubbing, cyanosis, or edema. Respiratory: Normal respiratory effort, no increased work of breathing. GI: Abdomen is soft, non tender, non distended, no abdominal masses. Liver and spleen not palpable.  No hernias appreciated.  Stool sample for occult testing is not indicated.   GU: No CVA tenderness.  No bladder fullness or masses.  Patient with uncircumcised phallus.  Foreskin easily retracted.  Vitiligo is present.    Urethral meatus is patent.  No penile discharge. No penile lesions or rashes. Scrotum without lesions, cysts, rashes and/or edema.  Testicles are located scrotally bilaterally. No masses are appreciated in the testicles. Left and right epididymis are normal. Rectal: Patient with  normal sphincter tone. Anus and perineum without scarring or rashes. No rectal masses are appreciated. Prostate is approximately 45 grams, 10 mm x 10 mm nodule in the left lobe is appreciated.   Skin: No rashes, bruises or suspicious lesions. Lymph: No inguinal adenopathy. Neurologic: Grossly intact, no focal deficits, moving all 4 extremities. Psychiatric: Normal mood and affect.  Laboratory Data: Lab Results  Component Value Date   WBC 2.9 (L) 08/11/2018   HGB 11.3 (L) 08/11/2018   HCT 34.8 (L) 08/11/2018   MCV 98.9 08/11/2018   PLT 131 (L) 08/11/2018    Urinalysis See hpi and epic  I have reviewed the labs.  Pertinent Imaging:   Assessment & Plan:    1. History of hematuria (high risk) Hematuria work up completed in 09/2017- findings positive for renal cysts and enlarged prostate No report of gross hematuria  UA today is negative for hematuria RTC in one year for UA - patient to report any gross hematuria in the interim    2. BPH with LUTS Continue conservative management, avoiding bladder irritants and  timed voiding's Continue tamsulosin 0.4 mg daily   3. Spermatocele vs hydrocele Scrotal US showed small left epididymal head cyst. No hydrocele or other abnormality noted.   4. Prostate nodule  Pt is not wanting to puruse surveillance of prostate cancer at this time due to his age and co morbidities  Return in about 1 year (around 08/13/2019) for IPSS, PVR and exam.  These notes generated with voice recognition software. I apologize for typographical errors.  Zara Council, PA-C  [facility] [address] [place]  [place], [place] [address] [phone]

## 2018-08-13 ENCOUNTER — Ambulatory Visit (INDEPENDENT_AMBULATORY_CARE_PROVIDER_SITE_OTHER): Payer: PPO | Admitting: Urology

## 2018-08-13 ENCOUNTER — Other Ambulatory Visit: Payer: Self-pay

## 2018-08-13 ENCOUNTER — Ambulatory Visit: Payer: PPO | Admitting: Family Medicine

## 2018-08-13 ENCOUNTER — Encounter: Payer: Self-pay | Admitting: Urology

## 2018-08-13 VITALS — BP 159/74 | HR 60 | Ht 69.0 in | Wt 185.0 lb

## 2018-08-13 DIAGNOSIS — N402 Nodular prostate without lower urinary tract symptoms: Secondary | ICD-10-CM

## 2018-08-13 DIAGNOSIS — N138 Other obstructive and reflux uropathy: Secondary | ICD-10-CM

## 2018-08-13 DIAGNOSIS — Z87448 Personal history of other diseases of urinary system: Secondary | ICD-10-CM | POA: Diagnosis not present

## 2018-08-13 DIAGNOSIS — N401 Enlarged prostate with lower urinary tract symptoms: Secondary | ICD-10-CM

## 2018-08-13 DIAGNOSIS — N4340 Spermatocele of epididymis, unspecified: Secondary | ICD-10-CM | POA: Diagnosis not present

## 2018-08-13 LAB — URINALYSIS, COMPLETE
Bilirubin, UA: NEGATIVE
Ketones, UA: NEGATIVE
Leukocytes,UA: NEGATIVE
Nitrite, UA: NEGATIVE
Specific Gravity, UA: >1.03 — ABNORMAL HIGH (ref 1.005–1.030)
Urobilinogen, Ur: 2 mg/dL — ABNORMAL HIGH (ref 0.2–1.0)
pH, UA: 5.5 (ref 5.0–7.5)

## 2018-08-13 LAB — MICROSCOPIC EXAMINATION
Bacteria, UA: NONE SEEN
RBC, Urine: NONE SEEN /hpf (ref 0–2)

## 2018-08-14 ENCOUNTER — Other Ambulatory Visit: Payer: Self-pay | Admitting: Cardiovascular Disease

## 2018-08-19 ENCOUNTER — Ambulatory Visit (INDEPENDENT_AMBULATORY_CARE_PROVIDER_SITE_OTHER): Payer: PPO | Admitting: Family Medicine

## 2018-08-19 ENCOUNTER — Other Ambulatory Visit: Payer: Self-pay

## 2018-08-19 DIAGNOSIS — E119 Type 2 diabetes mellitus without complications: Secondary | ICD-10-CM

## 2018-08-19 DIAGNOSIS — Z85038 Personal history of other malignant neoplasm of large intestine: Secondary | ICD-10-CM

## 2018-08-19 DIAGNOSIS — I5022 Chronic systolic (congestive) heart failure: Secondary | ICD-10-CM | POA: Diagnosis not present

## 2018-08-19 NOTE — Progress Notes (Signed)
Virtual Visit via telephone Note  This visit type was conducted due to national recommendations for restrictions regarding the COVID-19 pandemic (e.g. social distancing).  This format is felt to be most appropriate for this patient at this time.  All issues noted in this document were discussed and addressed.  No physical exam was performed (except for noted visual exam findings with Video Visits).   I connected with [name] today at  3:30 PM EDT by telephone and verified that I am speaking with the correct person using two identifiers. Location patient: daughter's house Location provider: work  Persons participating in the virtual visit: patient, provider, Romie Minus (wife)  I discussed the limitations, risks, security and privacy concerns of performing an evaluation and management service by telephone and the availability of in person appointments. I also discussed with the patient that there may be a patient responsible charge related to this service. The patient expressed understanding and agreed to proceed.  Interactive audio and video telecommunications were attempted between this provider and patient, however failed, due to patient having technical difficulties OR patient did not have access to video capability.  We continued and completed visit with audio only.   Reason for visit: Follow-up.  HPI: History of colon cancer: Patient notes no blood in his stool.  He has followed up with oncology.  CEA level has remained low.  No evidence of disease found on CT chest, abdomen, and pelvis.  He continues to follow with GI for colon polyp surveillance.  CHF: Patient denies orthopnea, PND, edema, shortness of breath, and chest pain.  He continues on carvedilol and losartan.  He notes no weight gain greater than 3 pounds recently.  He did have some findings of pulmonary edema on CT chest.  He has not followed up with cardiology since November 2019.  Diabetes: Not checking sugars.  Taking  metformin.  No polyuria or polydipsia.  No hypoglycemia.  He is due to see ophthalmology.   ROS: See pertinent positives and negatives per HPI.  Past Medical History:  Diagnosis Date  . Acute renal failure (ARF) ([place]) 08/14/2017  . Anemia   . CAD (coronary artery disease)    a. 02/2015 Cath: LM nl, LAD 50/40 mid/distal, LCX 80p, RCA 40p, 30d, RPL1 40, CO 3.27, CI 1.65; b. cath 06/09/15: LM minor irregs, m-dLAD 50%, dLAD 40%, pLCx 80% s/p PCI/DES 0%, pRCA 40%, dRCA 30%, 1st RPLB 40%  . Chronic systolic CHF (congestive heart failure) ([place])    a. 02/2015 Echo: EF 20-25%, diff HK, mod MR, mildy dil LA, mildly dil RV w/ mod RV syst dysfxn, mildly dil RA, mod TR, PASP 55mmHg; b. 09/2017 Echo: EF 40-45%, diff HK. Gr1 DD. Mild to mod MR. Mildly to mod dil LA.  . Colon cancer ([place])    a. 2015 s/p colectomy followed by chemoRx with oxaliplatin & fluorouacil.  . Dia hypertension 07/20/2014  . Diabetes mellitus without complication ([place])   . Hypertension   . Hypertensive heart disease   . Mixed Ischemic and Non-ischemic Cardiomyopathy    a. 02/2015 Echo: EF 20-25%, diff HK; b. 09/2016 Echo: EF 40-45%.  . Moderate mitral regurgitation    a. 09/2017 Echo: Mild to mod TR.  . Tubular adenoma of colon     Past Surgical History:  Procedure Laterality Date  . CARDIAC CATHETERIZATION Bilateral 02/24/2015   Procedure: Right/Left Heart Cath and Coronary Angiography;  Surgeon: Wellington Hampshire, MD;  Location: [facility];  Service: Cardiovascular;  Laterality:  Bilateral;  . CARDIAC CATHETERIZATION N/A 06/09/2015   Procedure: Coronary Stent Intervention;  Surgeon: Wellington Hampshire, MD;  Location: [facility];  Service: Cardiovascular;  Laterality: N/A;  . COLON SURGERY     Colectomy  . COLONOSCOPY    . COLONOSCOPY WITH PROPOFOL N/A 12/11/2016   Procedure: COLONOSCOPY WITH PROPOFOL;  Surgeon: Lollie Sails, MD;  Location: [facility] ENDOSCOPY;  Service: Endoscopy;  Laterality: N/A;  .  ESOPHAGOGASTRODUODENOSCOPY    . PORT-A-CATH REMOVAL N/A 07/12/2014   Procedure: REMOVAL PORT-A-CATH;  Surgeon: Molly Maduro, MD;  Location: [facility];  Service: General;  Laterality: N/A;  . PORTACATH PLACEMENT Left     Family History  Family history unknown: Yes    SOCIAL HX: Non-smoker.   Current Outpatient Medications:  .  aspirin EC 81 MG tablet, Take 1 tablet (81 mg total) by mouth daily., Disp: 90 tablet, Rfl: 3 .  atorvastatin (LIPITOR) 40 MG tablet, TAKE 1 TABLET BY MOUTH ONCE DAILY, Disp: 90 tablet, Rfl: 2 .  carvedilol (COREG) 3.125 MG tablet, TAKE 1 TABLET BY MOUTH TWICE DAILY, Disp: 60 tablet, Rfl: 0 .  gabapentin (NEURONTIN) 100 MG capsule, Take 2 capsules (200 mg total) by mouth 3 (three) times daily., Disp: 180 capsule, Rfl: 0 .  metFORMIN (GLUCOPHAGE) 500 MG tablet, Take 1 tablet (500 mg total) by mouth 2 (two) times daily with a meal., Disp: 180 tablet, Rfl: 3 .  tamsulosin (FLOMAX) 0.4 MG CAPS capsule, Take 1 capsule (0.4 mg total) by mouth daily after supper., Disp: 30 capsule, Rfl: 11 .  losartan (COZAAR) 50 MG tablet, Take 1 tablet (50 mg total) by mouth daily., Disp: 90 tablet, Rfl: 3  EXAM: This was a telehealth telephone visit and thus no physical exam was completed.  ASSESSMENT AND PLAN:  Discussed the following assessment and plan:  History of colon cancer He will continue follow-up with oncology.  We will have CMA reach out to [facility] to see when the patient is due for his next colonoscopy.  Diabetes [facility]) Patient will come in for a nurse visit for an A1c check.  He will continue on metformin.  Refer to ophthalmology for eye exam.  Chronic systolic heart failure [facility]) Patient is asymptomatic.  He had some signs of pulmonary edema on his most recent CT chest.  I will send my note to his cardiology office to determine if the patient needs a low dose of Lasix to treat this.  We will contact the patient when I hear back.  We will continue his current  regimen.   Social distancing precautions and sick precautions given regarding COVID-19.   I discussed the assessment and treatment plan with the patient. The patient was provided an opportunity to ask questions and all were answered. The patient agreed with the plan and demonstrated an understanding of the instructions.   The patient was advised to call back or seek an in-person evaluation if the symptoms worsen or if the condition fails to improve as anticipated.  I provided 14 minutes of non-face-to-face time during this encounter.   Tommi Rumps, MD

## 2018-08-20 ENCOUNTER — Telehealth: Payer: Self-pay | Admitting: Family Medicine

## 2018-08-20 NOTE — Telephone Encounter (Signed)
Noted.  Health maintenance changed.

## 2018-08-20 NOTE — Assessment & Plan Note (Signed)
He will continue follow-up with oncology.  We will have CMA reach out to [facility] to see when the patient is due for his next colonoscopy.

## 2018-08-20 NOTE — Telephone Encounter (Signed)
I called and spoke with a nurse at [facility] and she states he is due for his Colonoscopy 12/2018.  He had his last one 12/2016 with a 2 yr. recommendation.  Joseth Weigel,cma

## 2018-08-20 NOTE — Assessment & Plan Note (Addendum)
Patient will come in for a nurse visit for an A1c check.  He will continue on metformin.  Refer to ophthalmology for eye exam.

## 2018-08-20 NOTE — Assessment & Plan Note (Signed)
Patient is asymptomatic.  He had some signs of pulmonary edema on his most recent CT chest.  I will send my note to his cardiology office to determine if the patient needs a low dose of Lasix to treat this.  We will contact the patient when I hear back.  We will continue his current regimen.

## 2018-08-20 NOTE — Telephone Encounter (Signed)
I called pt and left a vm to call ofc and schedule Return in about 3 weeks (around 09/09/2018) for For nurse visit for A1c, 4 months with PCP.

## 2018-08-20 NOTE — Telephone Encounter (Signed)
Can you call [facility] and see if they can tell us when this patient will be due for his next colonoscopy? He has a history of colon cancer and I want to make sure we have his colonoscopy schedule up to date. Thanks.

## 2018-08-25 ENCOUNTER — Telehealth: Payer: Self-pay

## 2018-08-25 NOTE — Telephone Encounter (Signed)
Attempted to call patient. LMTCB 08/25/2018

## 2018-08-25 NOTE — Telephone Encounter (Signed)
-----   Message from Theora Gianotti, NP sent at 08/20/2018  2:33 PM EDT ----- Hi,  Would you pls reach out to [name]?  He is overdue for f/u and recent CT of his chest showed pulmonary edema.  He previously indicated that he has prn lasix to use @ home. Is it possible to add him on for a f/u visit this week or next and also determine whether or not he has lasix @ home. If so, he should take a dose.  Thanks,  Gerald Stabs ----- Message ----- From: Leone Haven, MD Sent: 08/20/2018   9:44 AM EDT To: Theora Gianotti, NP  Maricela Bo,   I saw Mr Lipinski for a virtual visit yesterday. It looks like you last saw him in November 2019. He is doing well with regards to his CHF and notes no dyspnea, PND, orthopnea, weight gain, swelling, or edema. I did notice that he had findings of mild pulmonary edema/trace dependent pleural effusions on his most recent CT chest earlier this month and given his lack of symptoms wanted to see if the imaging findings would be something you would consider a low dose of lasix for to help diurese. Thanks for your help.   Tommi Rumps

## 2018-08-28 NOTE — Telephone Encounter (Signed)
2nd attempt made to reach patient. All numbers attempted. Left VM on home and cell. Attempted to call spuce, no VM available.

## 2018-08-28 NOTE — Telephone Encounter (Signed)
Patient returning call.

## 2018-08-29 NOTE — Telephone Encounter (Signed)
Attempted to call the patient at his home & cell #'s. No answer- I left a message for the patient to please call back at both numbers.

## 2018-08-29 NOTE — Telephone Encounter (Signed)
I spoke with the patient and advised him of his pulmonary edema per CT scan. He is aware of Ignacia Bayley, NP's recommendations to follow up in clinic as he is overdue to reassess his fluid status. The patient states he has lasix at home and takes this daily, but he was not home to confirm the dose. "I think it's 0.5 mg." I advised lasix typically comes as 20 mg/ 40 mg/ 80 mg tablets.  He is agreeable with an office visit next week.  Gerald Stabs did not have any openings at this time.  He is agreeable with seeing Christell Faith, PA on 09/05/18 at 3:30 pm.  I have advised him he will need to enter through the [facility] and wear a mask for the duration of his time in the building.  He is aware we will call him closer to his appointment to ask the COVID screening questions.

## 2018-08-29 NOTE — Telephone Encounter (Signed)
Patient calling to discuss recent testing results  ° °Please call  ° °

## 2018-09-01 ENCOUNTER — Inpatient Hospital Stay: Payer: PPO

## 2018-09-01 ENCOUNTER — Other Ambulatory Visit: Payer: Self-pay

## 2018-09-01 DIAGNOSIS — D61818 Other pancytopenia: Secondary | ICD-10-CM | POA: Diagnosis not present

## 2018-09-01 DIAGNOSIS — E538 Deficiency of other specified B group vitamins: Secondary | ICD-10-CM

## 2018-09-01 MED ORDER — CYANOCOBALAMIN 1000 MCG/ML IJ SOLN
1000.0000 ug | INTRAMUSCULAR | Status: DC
  Administered 2018-09-01: 1000 ug via INTRAMUSCULAR

## 2018-09-04 NOTE — Progress Notes (Signed)
Cardiology Office Note    Date:  09/05/2018   ID:  [name]., DOB Jan 19, 1946, MRN [id number]  PCP:  Leone Haven, MD  Cardiologist:  Kathlyn Sacramento, MD  Electrophysiologist:  None   Chief Complaint: Follow up  History of Present Illness:   [name]. is a 73 y.o. male with history of CAD status post PCI to the LCx, HFrEF secondary to mixed NICM and ICM, pulmonary hypertension, colon cancer status post resection followed by chemotherapy, CKD stage II, hypertension, hyperlipidemia, mitral and tricuspid regurgitation, hypertensive heart disease, diabetes, pancytopenia and B12 deficiency anemia requriing infusions who presents for follow-up of his CAD and cardiomyopathy.  Patient was diagnosed with heart failure in 02/2015.  EF at that time was found to be 20-25% with moderate MR/TR and moderate to severe pulmonary hypertension.  He underwent diagnostic right and left cardiac cath in 02/2015 that showed pulmonary hypertension, elevated filling pressures, and decreased cardiac output.  LCx was found to be 80-90% stenosed with mild diffuse disease of the LAD and RCA. He was volume overloaded at the time of the cath leading to the recommendation for staged PCI after diuresis and medication optimization. He underwent staged PCI of the LCx in 06/2015. Repeat echo in 01/2016 showed a slightly improved EF of 35-40%, mild MR and left atrial enlargement. His Plavix was stopped in 07/2016 as he was doing well. He was admitted in 08/2017 with ARF secondary to UTI and dehydration with a SCr of 3.16 (1.31 a year prior). He was treated with antibiotics and his Lasix, spironolactone, lisinopril, and amlodipine were held. In follow up, he was placed back on losartan and prn Lasix. Repeat echo in 09/2017 to assess for potential transition from losartan to [facility] showed an improved EF of 40-45%, Gr1DD, moderate MR, and left atrial enlargement.   He was most recently seen in the office in 12/2017 and  was doing well, reporting rarely needing to take his prn Lasix. He was not weighing himself at home. His weight was up 8 pounds since his visit in 08/2017, though he attributed this to increased appetite. He was splitting wood prior to his appointment at that time without limitation. He did note some mild lower extremity edema that was worse with being on his feet and resolved with leg elevation.   More recently, he underwent follow up CT imaging with the cancer center on 08/07/2018 that showed incidental cardiomegaly with trace bilateral pleural effusions with mild interlobular septal thickening in the lower lungs suggestive of pulmonary edema. In this setting, it was recommended he follow up. We reached out to him and he indicated he was taking Lasix daily (ordered as prn), though also thought he was taking 0.5 mg of Lasix as well.   Patient comes in doing reasonably well today.  He denies any chest pain or shortness of breath.  He does feel like over the past 2 months he has noted worsening lower extremity swelling that has now progressed up to his bilateral mid thighs as well as now two-pillow orthopnea with a prior baseline of sleeping on one pillow.  He indicates drinking approximately 3 to 4 L of water on a daily basis.  He denies eating foods high in salt or adding salt to foods.  He states he has not taken an as needed Lasix for at least the past 3 to 4 months as he thought the prescription had expired.  His weight is up 6 pounds today when  compared to his last office visit weight.  He does not check his blood pressure at home.  He does report compliance with aspirin, Lipitor, Coreg, and losartan.  He denies any abdominal distention or early satiety.  No dizziness, presyncope, or syncope.  No falls since he was last seen.  No BRBPR or melena.  He continues to note peripheral neuropathy stemming from his chemotherapy.  Outside of his lower extremity swelling this appears to be his main complaint at this  time.  Labs: 08/2018 - WBC 2.9, HGB 11.3, PLT 131, K+ 4.2, SCr 1.31, albumin 3.6, AST/ALT normal 04/2018 - A1c 6.9 12/2017 - TC 110, TG 35, HDL 50, LDL 53 09/2017 - TSH normal  Past Medical History:  Diagnosis Date   Acute renal failure (ARF) ([name]) 08/14/2017   Anemia    CAD (coronary artery disease)    a. 02/2015 Cath: LM nl, LAD 50/40 mid/distal, LCX 80p, RCA 40p, 30d, RPL1 40, CO 3.27, CI 1.65; b. cath 06/09/15: LM minor irregs, m-dLAD 50%, dLAD 40%, pLCx 80% s/p PCI/DES 0%, pRCA 40%, dRCA 30%, 1st RPLB 74%   Chronic systolic CHF (congestive heart failure) ([place])    a. 02/2015 Echo: EF 20-25%, diff HK, mod MR, mildy dil LA, mildly dil RV w/ mod RV syst dysfxn, mildly dil RA, mod TR, PASP 30mmHg; b. 09/2017 Echo: EF 40-45%, diff HK. Gr1 DD. Mild to mod MR. Mildly to mod dil LA.   Colon cancer ([place])    a. 2015 s/p colectomy followed by chemoRx with oxaliplatin & fluorouacil.   Dia hypertension 07/20/2014   Diabetes mellitus without complication ([place])    Hypertension    Hypertensive heart disease    Mixed Ischemic and Non-ischemic Cardiomyopathy    a. 02/2015 Echo: EF 20-25%, diff HK; b. 09/2016 Echo: EF 40-45%.   Moderate mitral regurgitation    a. 09/2017 Echo: Mild to mod TR.   Tubular adenoma of colon     Past Surgical History:  Procedure Laterality Date   CARDIAC CATHETERIZATION Bilateral 02/24/2015   Procedure: Right/Left Heart Cath and Coronary Angiography;  Surgeon: Wellington Hampshire, MD;  Location: [place] [facility];  Service: Cardiovascular;  Laterality: Bilateral;   CARDIAC CATHETERIZATION N/A 06/09/2015   Procedure: Coronary Stent Intervention;  Surgeon: Wellington Hampshire, MD;  Location: [place] [facility];  Service: Cardiovascular;  Laterality: N/A;   COLON SURGERY     Colectomy   COLONOSCOPY     COLONOSCOPY WITH PROPOFOL N/A 12/11/2016   Procedure: COLONOSCOPY WITH PROPOFOL;  Surgeon: Lollie Sails, MD;  Location: [facility];  Service: Endoscopy;   Laterality: N/A;   ESOPHAGOGASTRODUODENOSCOPY     PORT-A-CATH REMOVAL N/A 07/12/2014   Procedure: REMOVAL PORT-A-CATH;  Surgeon: Molly Maduro, MD;  Location: [facility] ORS;  Service: General;  Laterality: N/A;   PORTACATH PLACEMENT Left     Current Medications: Current Meds  Medication Sig   aspirin EC 81 MG tablet Take 1 tablet (81 mg total) by mouth daily.   atorvastatin (LIPITOR) 40 MG tablet TAKE 1 TABLET BY MOUTH ONCE DAILY   carvedilol (COREG) 3.125 MG tablet TAKE 1 TABLET BY MOUTH TWICE DAILY   gabapentin (NEURONTIN) 100 MG capsule Take 2 capsules (200 mg total) by mouth 3 (three) times daily.   metFORMIN (GLUCOPHAGE) 500 MG tablet Take 1 tablet (500 mg total) by mouth 2 (two) times daily with a meal.   tamsulosin (FLOMAX) 0.4 MG CAPS capsule Take 1 capsule (0.4 mg total) by mouth daily after  supper.     Allergies:   Shellfish allergy   Social History   Socioeconomic History   Marital status: Married    Spouse name: Not on file   Number of children: Not on file   Years of education: Not on file   Highest education level: Not on file  Occupational History   Not on file  Social Needs   Financial resource strain: Not on file   Food insecurity    Worry: Not on file    Inability: Not on file   Transportation needs    Medical: Not on file    Non-medical: Not on file  Tobacco Use   Smoking status: Never Smoker   Smokeless tobacco: Never Used  Substance and Sexual Activity   Alcohol use: No   Drug use: No   Sexual activity: Yes  Lifestyle   Physical activity    Days per week: Not on file    Minutes per session: Not on file   Stress: Not on file  Relationships   Social connections    Talks on phone: Not on file    Gets together: Not on file    Attends religious service: Not on file    Active member of club or organization: Not on file    Attends meetings of clubs or organizations: Not on file    Relationship status: Not on file  Other  Topics Concern   Not on file  Social History Narrative   Not on file     Family History:  The patient's family history includes Cancer in his mother.  ROS:   Review of Systems  Constitutional: Positive for malaise/fatigue. Negative for chills, diaphoresis, fever and weight loss.  HENT: Negative for congestion.   Eyes: Negative for discharge and redness.  Respiratory: Negative for cough, hemoptysis, sputum production, shortness of breath and wheezing.   Cardiovascular: Positive for orthopnea and leg swelling. Negative for chest pain, palpitations, claudication and PND.  Gastrointestinal: Negative for abdominal pain, blood in stool, heartburn, melena, nausea and vomiting.  Genitourinary: Negative for hematuria.  Musculoskeletal: Negative for falls and myalgias.  Skin: Negative for rash.  Neurological: Positive for sensory change and weakness. Negative for dizziness, tingling, tremors, speech change, focal weakness and loss of consciousness.  Endo/Heme/Allergies: Does not bruise/bleed easily.  Psychiatric/Behavioral: Negative for substance abuse. The patient is not nervous/anxious.   All other systems reviewed and are negative.    EKGs/Labs/Other Studies Reviewed:    Studies reviewed were summarized above. The additional studies were reviewed today:  2D Echo 02/2015: - Left ventricle: The cavity size was mildly dilated. Systolic   function was severely reduced. The estimated ejection fraction   was in the range of 20% to 25%. Diffuse hypokinesis. Regional   wall motion abnormalities cannot be excluded. The study is not   technically sufficient to allow evaluation of LV diastolic   function. - Mitral valve: Calcified annulus. There was moderate   regurgitation. - Left atrium: The atrium was mildly dilated. - Right ventricle: The cavity size was mildly dilated. Wall   thickness was normal. Systolic function was moderately reduced. - Right atrium: The atrium was mildly  dilated. - Tricuspid valve: There was moderate regurgitation. - Pulmonary arteries: Systolic pressure was moderately to severely   increased. PA peak pressure: 63 mm Hg (S). __________  [facility] 02/2015:  Prox RCA lesion, 40% stenosed.  Dist RCA lesion, 30% stenosed.  1st RPLB lesion, 40% stenosed.  Mid LAD to Dist LAD lesion,  50% stenosed.  Dist LAD lesion, 40% stenosed.  Prox Cx lesion, 80% stenosed.   1. Moderate to severe pulmonary hypertension with severely elevated filling pressures. PA pressure was 70/24 with a mean of 40. Pulmonary wedge pressure was 30 mmHg. Severely reduced cardiac output at 3.27 with a cardiac index of 1.65. No evidence of aortic or mitral stenosis  2. Significant one-vessel coronary artery disease affecting the proximal left circumflex with moderate mildly calcified disease affecting the LAD and RCA.  3. Severely reduced LV systolic function by echocardiogram. Left ventricular angiography was not performed.  Recommendations: Cardiomyopathy is out of proportion to coronary artery disease. The patient was significantly fluid overloaded. I'm increasing furosemide to 40 mg twice daily. Check basic metabolic profile in one week and consider adding digoxin in order to up titrate other heart failure medications. Left circumflex PCI can be considered in the future once heart failure volume status is optimized. __________  PCI 06/2015:  Prox RCA lesion, 40% stenosed.  Dist RCA lesion, 30% stenosed.  1st RPLB lesion, 40% stenosed.  Mid LAD to Dist LAD lesion, 50% stenosed.  Dist LAD lesion, 40% stenosed.  Prox Cx lesion, 80% stenosed. Post intervention, there is a 0% residual stenosis.   Successful angioplasty and DES placement to the proximal LCX.   Recommendations: Dual antiplatelet therapy for at least 6 months. __________  2D Echo 01/2016: - Left ventricle: The cavity size was moderately dilated. There was   mild concentric hypertrophy.  Systolic function was moderately   reduced. The estimated ejection fraction was in the range of 35%   to 40%. Diffuse hypokinesis. Features are consistent with a   pseudonormal left ventricular filling pattern, with concomitant   abnormal relaxation and increased filling pressure (grade 2   diastolic dysfunction). - Mitral valve: There was mild regurgitation. - Left atrium: The atrium was moderately dilated. - Right atrium: The atrium was mildly dilated. ___________  2D Echo 09/2017: - Left ventricle: The cavity size was mildly dilated. There was   mild concentric hypertrophy. Systolic function was mildly to   moderately reduced. The estimated ejection fraction was in the   range of 40% to 45%. Diffuse hypokinesis. Doppler parameters are   consistent with abnormal left ventricular relaxation (grade 1   diastolic dysfunction). - Mitral valve: There was mild to moderate regurgitation. - Left atrium: The atrium was mildly to moderately dilated. - Pulmonary arteries: Systolic pressure could not be accurately   estimated. __________  EKG:  EKG is ordered today.  The EKG ordered today demonstrates NSR, 63 bpm, sinus arrhythmia, rare PAC, inferolateral T wave abnormality consistent with prior studies  Recent Labs: 09/27/2017: TSH 1.190 08/11/2018: ALT 24; BUN 17; Creatinine, Ser 1.31; Hemoglobin 11.3; Platelets 131; Potassium 4.2; Sodium 144  Recent Lipid Panel    Component Value Date/Time   CHOL 110 12/30/2017 1058   CHOL 143 08/02/2016 1110   TRIG 35 12/30/2017 1058   HDL 50 12/30/2017 1058   HDL 69 08/02/2016 1110   CHOLHDL 2.2 12/30/2017 1058   VLDL 7 12/30/2017 1058   LDLCALC 53 12/30/2017 1058   LDLCALC 64 08/02/2016 1110    PHYSICAL EXAM:    VS:  BP (!) 150/100 (BP Location: Left Arm, Patient Position: Sitting, Cuff Size: Normal)    Pulse 63    Temp (!) 96.8 F (36 C)    Ht 5\' 9"  (1.753 m)    Wt 190 lb 4 oz (86.3 kg)    SpO2 95%  BMI 28.10 kg/m   BMI: Body mass index is  28.1 kg/m.  Physical Exam  Constitutional: He is oriented to person, place, and time. He appears well-developed and well-nourished.  HENT:  Head: Normocephalic and atraumatic.  Eyes: Right eye exhibits no discharge. Left eye exhibits no discharge.  Neck: Normal range of motion. No JVD present.  Cardiovascular: Normal rate, regular rhythm, S1 normal, S2 normal and normal heart sounds. Exam reveals no distant heart sounds, no friction rub, no midsystolic click and no opening snap.  No murmur heard. Pulses:      Posterior tibial pulses are 2+ on the right side and 2+ on the left side.  Pulmonary/Chest: Effort normal and breath sounds normal. No respiratory distress. He has no decreased breath sounds. He has no wheezes. He has no rales. He exhibits no tenderness.  Abdominal: Soft. He exhibits no distension. There is no abdominal tenderness.  Musculoskeletal:        General: Edema present.     Comments: 1-2+ lower extremity pitting edema to the mid thighs  Neurological: He is alert and oriented to person, place, and time.  Skin: Skin is warm and dry. No cyanosis. Nails show no clubbing.  Psychiatric: He has a normal mood and affect. His speech is normal and behavior is normal. Judgment and thought content normal.    Wt Readings from Last 3 Encounters:  09/05/18 190 lb 4 oz (86.3 kg)  08/13/18 185 lb (83.9 kg)  08/11/18 178 lb 12.8 oz (81.1 kg)     ASSESSMENT & PLAN:   1. Acute on chronic HFrEF secondary to mixed NICM and ICM: Patient is volume overloaded on exam today with a weight that is 6 pounds up compared to his last office visit in 12/2017 which was also of 8 pounds when compared to his visit in 08/2017.  Surprisingly though, he does continue to remain very active and mow multiple lawns.  He has not taken as needed Lasix for the past several months with noted worsening lower extremity edema and now two-pillow orthopnea.  Most recent echo from 09/2017 showed continued improvement in LV  systolic function with an EF of 45% which precluded the transition from losartan to Entresto at that time.  He drinks large amounts of water, approximately 3 to 4 L/day.  Recommend we initiate Lasix 40 mg twice daily for 5 days followed by 40 mg daily thereafter along with KCl 20 mEq x 5 days followed by 10 mEq daily thereafter.  Recent renal function and potassium outlined as above.  He will recheck a BMP in 1 week.  Obtain echocardiogram to evaluate LV systolic function and right-sided pressure.  If his EF is now less than 40% given his hypertensive heart disease would recommend transition from losartan to [facility].  In follow-up, after he has been diuresed, would recommend addition of spironolactone as well as long as renal function and potassium remain stable.  CHF education was discussed in detail with recommendation to drink no more than 2 L of all fluids daily and to limit sodium intake.  Recommend patient weigh himself daily and obtain blood pressure cuff.  2. CAD involving native coronary arteries without angina: He is doing well without any symptoms concerning for angina.  Continue secondary prevention with aspirin 81 mg daily along with Lipitor and carvedilol.  LDL at goal as outlined below.  Optimal blood pressure recommended as outlined below.  No plans for ischemic evaluation at this time.  3. Hypertensive heart disease with heart  failure and CKD stage II: Blood pressure is suboptimally controlled today, likely in the setting of volume overload.  Initiate outpatient diuresis as outlined above.  Otherwise, he will continue Coreg and losartan.  In outpatient follow-up in 2 weeks, pending what his repeat echo shows could consider, transition from losartan to [facility] versus addition of spironolactone as outlined above.  Low-sodium diet recommended.  Unable to escalate carvedilol further at this time given acute on chronic HFrEF and heart rate in the low 60s bpm.  Check BMP in 1 week as outlined above.   Recommend he follow-up with nephrology as directed.  4. Hyperlipidemia: LDL of 53 from 12/2017 with goal LDL less than 70.  LFT normal earlier this month.  Continue atorvastatin 40 mg daily.  Disposition: F/u with [name] or myself in 2 weeks.   Medication Adjustments/Labs and Tests Ordered: Current medicines are reviewed at length with the patient today.  Concerns regarding medicines are outlined above. Medication changes, Labs and Tests ordered today are summarized above and listed in the Patient Instructions accessible in Encounters.   Signed, Christell Faith, PA-C 09/05/2018 3:03 PM     [address] [place], [place] [address] [phone]

## 2018-09-05 ENCOUNTER — Encounter: Payer: Self-pay | Admitting: Physician Assistant

## 2018-09-05 ENCOUNTER — Other Ambulatory Visit: Payer: Self-pay

## 2018-09-05 ENCOUNTER — Ambulatory Visit (INDEPENDENT_AMBULATORY_CARE_PROVIDER_SITE_OTHER): Payer: PPO | Admitting: Physician Assistant

## 2018-09-05 VITALS — BP 150/100 | HR 63 | Temp 96.8°F | Ht 69.0 in | Wt 190.2 lb

## 2018-09-05 DIAGNOSIS — I255 Ischemic cardiomyopathy: Secondary | ICD-10-CM

## 2018-09-05 DIAGNOSIS — I428 Other cardiomyopathies: Secondary | ICD-10-CM | POA: Diagnosis not present

## 2018-09-05 DIAGNOSIS — I130 Hypertensive heart and chronic kidney disease with heart failure and stage 1 through stage 4 chronic kidney disease, or unspecified chronic kidney disease: Secondary | ICD-10-CM | POA: Diagnosis not present

## 2018-09-05 DIAGNOSIS — I5023 Acute on chronic systolic (congestive) heart failure: Secondary | ICD-10-CM | POA: Diagnosis not present

## 2018-09-05 DIAGNOSIS — E785 Hyperlipidemia, unspecified: Secondary | ICD-10-CM | POA: Diagnosis not present

## 2018-09-05 DIAGNOSIS — I2510 Atherosclerotic heart disease of native coronary artery without angina pectoris: Secondary | ICD-10-CM

## 2018-09-05 MED ORDER — FUROSEMIDE 40 MG PO TABS: ORAL_TABLET | ORAL | 1 refills | Status: DC

## 2018-09-05 MED ORDER — POTASSIUM CHLORIDE ER 10 MEQ PO TBCR: EXTENDED_RELEASE_TABLET | ORAL | 1 refills | Status: DC

## 2018-09-05 NOTE — Patient Instructions (Addendum)
Medication Instructions:  - Your physician has recommended you make the following change in your medication:   1) Start lasix (furosemide) 40 mg: - take 1 tablet (40 mg) by mouth TWICE daily x 5 days, then - take 1 tablet (40 mg) by mouth ONCE daily  2) Start potassium 10 meq: - take 2 tablets (20 meq) by mouth ONCE daily x 5 days, then - take 1 tablet (10 meq) by mouth ONCE daily   If you need a refill on your cardiac medications before your next appointment, please call your pharmacy.   Lab work: - Your physician recommends that you return for lab work- Thursday 09/11/18- BMP  If you have labs (blood work) drawn today and your tests are completely normal, you will receive your results only by: Marland Kitchen MyChart Message (if you have MyChart) OR . A paper copy in the mail If you have any lab test that is abnormal or we need to change your treatment, we will call you to review the results.  Testing/Procedures: - Your physician has requested that you have an echocardiogram. Echocardiography is a painless test that uses sound waves to create images of your heart. It provides your doctor with information about the size and shape of your heart and how well your heart's chambers and valves are working. This procedure takes approximately one hour. There are no restrictions for this procedure.  Follow-Up: At [facility], you and your health needs are our priority.  As part of our continuing mission to provide you with exceptional heart care, we have created designated Provider Care Teams.  These Care Teams include your primary Cardiologist (physician) and Advanced Practice Providers (APPs -  Physician Assistants and Nurse Practitioners) who all work together to provide you with the care you need, when you need it. . in 2 weeks with Christell Faith, PA  Any Other Special Instructions Will Be Listed Below (If Applicable). - N/A   Echocardiogram An echocardiogram is a procedure that uses painless sound  waves (ultrasound) to produce an image of the heart. Images from an echocardiogram can provide important information about:  Signs of coronary artery disease (CAD).  Aneurysm detection. An aneurysm is a weak or damaged part of an artery wall that bulges out from the normal force of blood pumping through the body.  Heart size and shape. Changes in the size or shape of the heart can be associated with certain conditions, including heart failure, aneurysm, and CAD.  Heart muscle function.  Heart valve function.  Signs of a past heart attack.  Fluid buildup around the heart.  Thickening of the heart muscle.  A tumor or infectious growth around the heart valves. Tell a health care provider about:  Any allergies you have.  All medicines you are taking, including vitamins, herbs, eye drops, creams, and over-the-counter medicines.  Any blood disorders you have.  Any surgeries you have had.  Any medical conditions you have.  Whether you are pregnant or may be pregnant. What are the risks? Generally, this is a safe procedure. However, problems may occur, including:  Allergic reaction to dye (contrast) that may be used during the procedure. What happens before the procedure? No specific preparation is needed. You may eat and drink normally. What happens during the procedure?   An IV tube may be inserted into one of your veins.  You may receive contrast through this tube. A contrast is an injection that improves the quality of the pictures from your heart.  A  gel will be applied to your chest.  A wand-like tool (transducer) will be moved over your chest. The gel will help to transmit the sound waves from the transducer.  The sound waves will harmlessly bounce off of your heart to allow the heart images to be captured in real-time motion. The images will be recorded on a computer. The procedure may vary among health care providers and hospitals. What happens after the  procedure?  You may return to your normal, everyday life, including diet, activities, and medicines, unless your health care provider tells you not to do that. Summary  An echocardiogram is a procedure that uses painless sound waves (ultrasound) to produce an image of the heart.  Images from an echocardiogram can provide important information about the size and shape of your heart, heart muscle function, heart valve function, and fluid buildup around your heart.  You do not need to do anything to prepare before this procedure. You may eat and drink normally.  After the echocardiogram is completed, you may return to your normal, everyday life, unless your health care provider tells you not to do that. This information is not intended to replace advice given to you by your health care provider. Make sure you discuss any questions you have with your health care provider. Document Released: 01/20/2000 Document Revised: 05/15/2018 Document Reviewed: 02/25/2016 Elsevier Patient Education  2020 [employer].

## 2018-09-16 ENCOUNTER — Other Ambulatory Visit: Payer: Self-pay | Admitting: Cardiovascular Disease

## 2018-09-29 ENCOUNTER — Other Ambulatory Visit: Payer: Self-pay

## 2018-09-29 ENCOUNTER — Inpatient Hospital Stay: Payer: PPO | Attending: Oncology

## 2018-09-29 DIAGNOSIS — E538 Deficiency of other specified B group vitamins: Secondary | ICD-10-CM | POA: Insufficient documentation

## 2018-09-29 MED ORDER — CYANOCOBALAMIN 1000 MCG/ML IJ SOLN
1000.0000 ug | INTRAMUSCULAR | Status: DC
  Administered 2018-09-29: 1000 ug via INTRAMUSCULAR

## 2018-09-30 ENCOUNTER — Ambulatory Visit (INDEPENDENT_AMBULATORY_CARE_PROVIDER_SITE_OTHER): Payer: PPO

## 2018-09-30 DIAGNOSIS — I5023 Acute on chronic systolic (congestive) heart failure: Secondary | ICD-10-CM

## 2018-10-01 ENCOUNTER — Other Ambulatory Visit: Payer: Self-pay

## 2018-10-01 ENCOUNTER — Other Ambulatory Visit
Admission: RE | Admit: 2018-10-01 | Discharge: 2018-10-01 | Disposition: A | Discharge status: Home / Self Care | Payer: PPO | Source: Ambulatory Visit | Attending: Nurse Practitioner | Admitting: Nurse Practitioner

## 2018-10-01 ENCOUNTER — Telehealth: Payer: Self-pay

## 2018-10-01 DIAGNOSIS — I5023 Acute on chronic systolic (congestive) heart failure: Secondary | ICD-10-CM | POA: Diagnosis not present

## 2018-10-01 LAB — BASIC METABOLIC PANEL
Anion gap: 7 (ref 5–15)
BUN: 18 mg/dL (ref 8–23)
CO2: 23 mmol/L (ref 22–32)
Calcium: 9.2 mg/dL (ref 8.9–10.3)
Chloride: 112 mmol/L — ABNORMAL HIGH (ref 98–111)
Creatinine, Ser: 1.45 mg/dL — ABNORMAL HIGH (ref 0.61–1.24)
GFR calc Af Amer: 56 mL/min — ABNORMAL LOW (ref >60)
GFR calc non Af Amer: 48 mL/min — ABNORMAL LOW (ref >60)
Glucose, Bld: 129 mg/dL — ABNORMAL HIGH (ref 70–99)
Potassium: 4 mmol/L (ref 3.5–5.1)
Sodium: 142 mmol/L (ref 135–145)

## 2018-10-01 LAB — HEMOGLOBIN A1C
Hgb A1c MFr Bld: 6.2 % — ABNORMAL HIGH (ref 4.8–5.6)
Mean Plasma Glucose: 131.24 mg/dL

## 2018-10-01 NOTE — Telephone Encounter (Signed)
Attempted to call patient. LMTCB 10/01/2018   

## 2018-10-01 NOTE — Telephone Encounter (Signed)
-----   Message from Theora Gianotti, NP sent at 10/01/2018  7:46 AM EDT ----- Heart squeezing function is reduced - now 25-30% (was prev  40-45%).  Right sided heart pressures are very, very high - likely secondary to volume excess.  His mitral valve is also moderately to severely leaky.  With very high pressures, it's hard to know if this is secondary to volume excess or a primary problem.  Is he taking lasix as Rx (should be 40 daily)?  If yes, pls have him increase lasix to 40 BID.  He was supposed to have a bmet 1 wk after his last office visit, however I do not see that his occurred.  Pls arrange for a bmet before the end of this week.  Keep f/u appt w/ Thurmond Butts on 9/2 @ which time we will likely adjust meds further and will need to consider further diagnostic eval (right and left heart cath given new reduction in EF).

## 2018-10-01 NOTE — Telephone Encounter (Signed)
Spoke to patient. He verbalized understanding of results and agreeable to POC. Confirmed appt for next Wednesday.   Pt agrees to [facility] the medical mall today for BMET. Orders placed for labs to be taken at the mm.   Advised pt to call for any further questions or concerns.

## 2018-10-01 NOTE — Telephone Encounter (Signed)
Patient is returning you call.

## 2018-10-02 ENCOUNTER — Telehealth: Payer: Self-pay

## 2018-10-02 DIAGNOSIS — E119 Type 2 diabetes mellitus without complications: Secondary | ICD-10-CM | POA: Diagnosis not present

## 2018-10-02 LAB — HM DIABETES EYE EXAM

## 2018-10-02 NOTE — Telephone Encounter (Signed)
-----   Message from Theora Gianotti, NP sent at 10/01/2018  4:49 PM EDT ----- Kidney fxn/lytes relatively stable and ok to increase lasix to 40 bid. F/u w/ ryan next week.

## 2018-10-02 NOTE — Telephone Encounter (Signed)
Call to patient to discuss labs, left detailed message. No new orders.   Advised pt to call for any further questions or concerns.

## 2018-10-06 DIAGNOSIS — N183 Chronic kidney disease, stage 3 (moderate): Secondary | ICD-10-CM | POA: Diagnosis not present

## 2018-10-06 DIAGNOSIS — I129 Hypertensive chronic kidney disease with stage 1 through stage 4 chronic kidney disease, or unspecified chronic kidney disease: Secondary | ICD-10-CM | POA: Diagnosis not present

## 2018-10-06 DIAGNOSIS — E1122 Type 2 diabetes mellitus with diabetic chronic kidney disease: Secondary | ICD-10-CM | POA: Diagnosis not present

## 2018-10-06 DIAGNOSIS — E1129 Type 2 diabetes mellitus with other diabetic kidney complication: Secondary | ICD-10-CM | POA: Diagnosis not present

## 2018-10-06 NOTE — Progress Notes (Signed)
Cardiology Office Note    Date:  10/10/2018   ID:  [name]., DOB 1945/08/24, MRN [id number]  PCP:  Leone Haven, MD  Cardiologist:  Kathlyn Sacramento, MD  Electrophysiologist:  None   Chief Complaint: Follow up  History of Present Illness:   [name]. is a 73 y.o. male with history of CAD status post PCI to the LCx, HFrEF secondary to mixed NICM and ICM, pulmonary hypertension, colon cancer status post resection followed by chemotherapy, CKD stage II, hypertension, hyperlipidemia, mitral and tricuspid regurgitation, hypertensive heart disease, diabetes, pancytopenia and B12 deficiency anemia requiring infusions who presents for follow-up of his CAD and cardiomyopathy.  Patient was diagnosed with heart failure in 02/2015.  EF at that time was found to be 20-25% with moderate MR/TR and moderate to severe pulmonary hypertension.  He underwent diagnostic R/LHC in 02/2015 that showed pulmonary hypertension, elevated filling pressures, and decreased cardiac output.  LCx was found to be 80-90% stenosed with mild diffuse disease of the LAD and RCA. He was volume overloaded at the time of the cath leading to the recommendation for staged PCI after diuresis and medication optimization. He underwent staged PCI of the LCx in 06/2015. Repeat echo in 01/2016 showed a slightly improved EF of 35-40%, mild MR and left atrial enlargement. His Plavix was stopped in 07/2016 as he was doing well. He was admitted in 08/2017 with ARF secondary to UTI and dehydration with a SCr of 3.16 (1.31 a year prior). He was treated with antibiotics and his Lasix, spironolactone, lisinopril, and amlodipine were held. In follow up, he was placed back on losartan and prn Lasix. Repeat echo in 09/2017 to assess for potential transition from losartan to [facility] showed an improved EF of 40-45%, Gr1DD, moderate MR, and left atrial enlargement.  He was seen in 12/2017 and was doing well, reporting rarely needing to take his  prn Lasix. He was not weighing himself at home. His weight was up 8 pounds since his visit in 08/2017, though he attributed this to increased appetite. He was splitting wood prior to his appointment at that time without limitation. He did note some mild lower extremity edema that was worse with being on his feet and resolved with leg elevation.  More recently, he underwent follow up CT imaging with the cancer center on 08/07/2018 that showed incidental cardiomegaly with trace bilateral pleural effusions with mild interlobular septal thickening in the lower lungs suggestive of pulmonary edema. In this setting, it was recommended he follow up.  He was seen on 09/05/2018 noting an approximate 2 month history of worsening lower extremity swelling and two pillow orthopnea.  He reported drinking 3-4 L of water daily.  He had not been taking any Lasix.  He reported compliance with remaining medications.  His weight was up 6 pounds when compared to prior office visit (184-->190 pounds).  He was advised to start taking Lasix 40 mg daily.  Follow up echo on 09/30/2018 showed an EF of 25-30%, mildly dilated LV, diastolic dysfunction, diffuse HK, elevated LA pressure, severely reduced RVSF with mildly enlarged RV cavity, RVSP 82.9 mmHg, severely dilated LA, mildly dilated RA, small circumferential pericardial effusion, moderate to severe MR, tricuspid aortic valve with mild aortic annular calcification.  In this setting, his Lasix was increased to 40 mg bid.    Patient comes in feeling very well today.  He denies any chest pain, shortness of breath, palpitations, dizziness, presyncope, or syncope.  His lower  extremity swelling has greatly improved and is now resolved.  He has stable two-pillow orthopnea.  He denies any abdominal distention, PND, or early satiety.  No falls, BRBPR, or melena.  He continues to be very active and just recently finished using a push mower to mow half of his lawn.  With outpatient diuresis as outlined  above, his weight has improved from 190 pounds at last visit to 168 pounds today.  He is not checking his blood pressure at home.  He does not add salt to food or eat out at restaurants.  He continues to drink large amounts of water at approximately 3 to 4 L/day.  He is compliant with all medications.   Labs: 09/2018 - SCr 1.45, K+ 4.0, A1c 6.2 08/2018 - WBC 2.9, HGB 11.3, PLT 131, K+ 4.2, SCr 1.31, albumin 3.6, AST/ALT normal 12/2017 - TC 110, TG 35, HDL 50, LDL 53 09/2017 - TSH normal  Past Medical History:  Diagnosis Date   Acute renal failure (ARF) ([place]) 08/14/2017   Anemia    CAD (coronary artery disease)    a. 02/2015 Cath: LM nl, LAD 50/40 mid/distal, LCX 80p, RCA 40p, 30d, RPL1 40, CO 3.27, CI 1.65; b. cath 06/09/15: LM minor irregs, m-dLAD 50%, dLAD 40%, pLCx 80% s/p PCI/DES 0%, pRCA 40%, dRCA 30%, 1st RPLB [id number]   Chronic systolic CHF (congestive heart failure) ([place])    a. 02/2015 Echo: EF 20-25%, diff HK, mod MR, mildy dil LA, mildly dil RV w/ mod RV syst dysfxn, mildly dil RA, mod TR, PASP 59mmHg; b. 09/2017 Echo: EF 40-45%, diff HK. Gr1 DD. Mild to mod MR. Mildly to mod dil LA.   Colon cancer ([place])    a. 2015 s/p colectomy followed by chemoRx with oxaliplatin & fluorouacil.   Dia hypertension 07/20/2014   Diabetes mellitus without complication ([place])    Hypertension    Hypertensive heart disease    Mixed Ischemic and Non-ischemic Cardiomyopathy    a. 02/2015 Echo: EF 20-25%, diff HK; b. 09/2016 Echo: EF 40-45%.   Moderate mitral regurgitation    a. 09/2017 Echo: Mild to mod TR.   Tubular adenoma of colon     Past Surgical History:  Procedure Laterality Date   CARDIAC CATHETERIZATION Bilateral 02/24/2015   Procedure: Right/Left Heart Cath and Coronary Angiography;  Surgeon: Wellington Hampshire, MD;  Location: [place] [facility];  Service: Cardiovascular;  Laterality: Bilateral;   CARDIAC CATHETERIZATION N/A 06/09/2015   Procedure: Coronary Stent Intervention;  Surgeon:  Wellington Hampshire, MD;  Location: [place] [facility];  Service: Cardiovascular;  Laterality: N/A;   COLON SURGERY     Colectomy   COLONOSCOPY     COLONOSCOPY WITH PROPOFOL N/A 12/11/2016   Procedure: COLONOSCOPY WITH PROPOFOL;  Surgeon: Lollie Sails, MD;  Location: [facility] ENDOSCOPY;  Service: Endoscopy;  Laterality: N/A;   ESOPHAGOGASTRODUODENOSCOPY     PORT-A-CATH REMOVAL N/A 07/12/2014   Procedure: REMOVAL PORT-A-CATH;  Surgeon: Molly Maduro, MD;  Location: [facility] ORS;  Service: General;  Laterality: N/A;   PORTACATH PLACEMENT Left     Current Medications: Current Meds  Medication Sig   aspirin EC 81 MG tablet Take 1 tablet (81 mg total) by mouth daily.   atorvastatin (LIPITOR) 40 MG tablet TAKE 1 TABLET BY MOUTH ONCE DAILY   carvedilol (COREG) 3.125 MG tablet TAKE 1 TABLET BY MOUTH TWICE DAILY   furosemide (LASIX) 40 MG tablet Take 1 tablet (40 mg) by mouth twice daily x 5 days, then  take 1 tablet (40 mg) by mouth once daily   gabapentin (NEURONTIN) 100 MG capsule Take 2 capsules (200 mg total) by mouth 3 (three) times daily.   metFORMIN (GLUCOPHAGE) 500 MG tablet Take 1 tablet (500 mg total) by mouth 2 (two) times daily with a meal.   potassium chloride (K-DUR) 10 MEQ tablet Take 2 tablets (20 meq) by mouth once daily x 5 days, then take 1 tablet (10 meq) by mouth once daily   tamsulosin (FLOMAX) 0.4 MG CAPS capsule Take 1 capsule (0.4 mg total) by mouth daily after supper.     Allergies:   Shellfish allergy   Social History   Socioeconomic History   Marital status: Married    Spouse name: Not on file   Number of children: Not on file   Years of education: Not on file   Highest education level: Not on file  Occupational History   Not on file  Social Needs   Financial resource strain: Not on file   Food insecurity    Worry: Not on file    Inability: Not on file   Transportation needs    Medical: Not on file    Non-medical: Not on file  Tobacco  Use   Smoking status: Never Smoker   Smokeless tobacco: Never Used  Substance and Sexual Activity   Alcohol use: No   Drug use: No   Sexual activity: Yes  Lifestyle   Physical activity    Days per week: Not on file    Minutes per session: Not on file   Stress: Not on file  Relationships   Social connections    Talks on phone: Not on file    Gets together: Not on file    Attends religious service: Not on file    Active member of club or organization: Not on file    Attends meetings of clubs or organizations: Not on file    Relationship status: Not on file  Other Topics Concern   Not on file  Social History Narrative   Not on file     Family History:  The patient's family history includes Cancer in his mother.  ROS:   Review of Systems  Constitutional: Negative for chills, diaphoresis, fever, malaise/fatigue and weight loss.  HENT: Negative for congestion.   Eyes: Negative for discharge and redness.  Respiratory: Negative for cough, hemoptysis, sputum production, shortness of breath and wheezing.   Cardiovascular: Negative for chest pain, palpitations, orthopnea, claudication, leg swelling and PND.  Gastrointestinal: Negative for abdominal pain, blood in stool, heartburn, melena, nausea and vomiting.  Genitourinary: Negative for hematuria.  Musculoskeletal: Negative for falls and myalgias.  Skin: Negative for rash.  Neurological: Negative for dizziness, tingling, tremors, sensory change, speech change, focal weakness, loss of consciousness and weakness.  Endo/Heme/Allergies: Does not bruise/bleed easily.  Psychiatric/Behavioral: Negative for substance abuse. The patient is not nervous/anxious.   All other systems reviewed and are negative.    EKGs/Labs/Other Studies Reviewed:    Studies reviewed were summarized above. The additional studies were reviewed today:  2D Echo 09/2018: 1. The left ventricle has severely reduced systolic function, with an ejection  fraction of 25-30%. The cavity size was mildly dilated. There is mildly increased left ventricular wall thickness. Left ventricular diastolic Doppler parameters are  consistent with restrictive filling. Elevated mean left atrial pressure Left ventricular diffuse hypokinesis.  2. The right ventricle has severely reduced systolic function. The cavity was mildly enlarged. There is no increase in right  ventricular wall thickness. Right ventricular systolic pressure is severely elevated with an estimated pressure of 82.9 mmHg.  3. Left atrial size was severely dilated.  4. Right atrial size was mildly dilated.  5. Small pericardial effusion.  6. The pericardial effusion is circumferential.  7. Mitral valve regurgitation is moderate to severe by color flow Doppler.  8. The aortic valve is tricuspid. Mild aortic annular calcification noted.  9. The aorta is normal unless otherwise noted. 10. The inferior vena cava was dilated in size with <50% respiratory variability. 11. The interatrial septum was not well visualized.   EKG:  EKG is ordered today.  The EKG ordered today demonstrates sinus bradycardia, 56 bpm, LVH, inferior lateral T wave inversion, grossly unchanged from prior  Recent Labs: 08/11/2018: ALT 24; Hemoglobin 11.3; Platelets 131 10/01/2018: BUN 18; Creatinine, Ser 1.45; Potassium 4.0; Sodium 142  Recent Lipid Panel    Component Value Date/Time   CHOL 110 12/30/2017 1058   CHOL 143 08/02/2016 1110   TRIG 35 12/30/2017 1058   HDL 50 12/30/2017 1058   HDL 69 08/02/2016 1110   CHOLHDL 2.2 12/30/2017 1058   VLDL 7 12/30/2017 1058   LDLCALC 53 12/30/2017 1058   LDLCALC 64 08/02/2016 1110    PHYSICAL EXAM:    VS:  BP 130/64 (BP Location: Left Arm, Patient Position: Sitting, Cuff Size: Normal)    Pulse 62    Ht 5\' 9"  (1.753 m)    Wt 168 lb (76.2 kg)    SpO2 97%    BMI 24.81 kg/m   BMI: Body mass index is 24.81 kg/m.  Physical Exam  Constitutional: He is oriented to person, place,  and time. He appears well-developed and well-nourished.  HENT:  Head: Normocephalic and atraumatic.  Eyes: Right eye exhibits no discharge. Left eye exhibits no discharge.  Neck: Normal range of motion. No JVD present.  Cardiovascular: Normal rate, regular rhythm, S1 normal and S2 normal. Exam reveals no distant heart sounds, no friction rub, no midsystolic click and no opening snap.  Murmur heard. High-pitched blowing holosystolic murmur is present with a grade of 2/6 at the apex. Pulses:      Posterior tibial pulses are 2+ on the right side and 2+ on the left side.  Pulmonary/Chest: Effort normal and breath sounds normal. No respiratory distress. He has no decreased breath sounds. He has no wheezes. He has no rales. He exhibits no tenderness.  Abdominal: Soft. He exhibits no distension. There is no abdominal tenderness.  Musculoskeletal:        General: No edema.     Comments: Resolved lower extremity swelling with chronic hyperpigmentation secondary to longstanding swelling  Neurological: He is alert and oriented to person, place, and time.  Skin: Skin is warm and dry. No cyanosis. Nails show no clubbing.  Psychiatric: He has a normal mood and affect. His speech is normal and behavior is normal. Judgment and thought content normal.    Wt Readings from Last 3 Encounters:  10/10/18 168 lb (76.2 kg)  09/05/18 190 lb 4 oz (86.3 kg)  08/13/18 185 lb (83.9 kg)     ASSESSMENT & PLAN:   1. Chronic combined CHF secondary to mixed ICM and NICM/bi-V failure/pulmonary hypertension: Patient was noted to be significantly volume overloaded in the setting of copious amounts of water intake and noncompliance with diuretic therapy.  Echo from 09/30/2018 showed worsening of LV systolic function with an EF of 25 to 30% (previously 40 to 45%), right-sided pressure of 83  mmHg along with mild to moderate mitral regurgitation.  Patient has responded very well to outpatient diuresis with Lasix 40 mg twice daily  and KCl 10 mEq daily with a weight reduction from 190 pounds in late 08/2018 to a current weight of 168 pounds.  With such dramatic weight loss we will likely need to de-escalate his diuretic therapy.  We will obtain a BMP today with further input regarding his diuretic therapy following these labs.  Also, after we have reviewed updated renal function we will likely undertake the transition from losartan to [facility].  Given his significant cardiomyopathy, and heart rate of 62 bpm, I am uncertain if he would tolerate further escalation of carvedilol at this time.  When he is seen back in follow-up in 1 month we should look to add Spironolactone back to his regimen as well.  Following optimization and compliance of medical therapy we will need to repeat echocardiogram in several months time to evaluate for improvement in LV systolic function, trending right-sided pressure, and to reassess his mitral regurgitation.  CHF education was discussed with recommendation for patient to drink less than 2 L of fluids daily.  Following reinitiation of evidence-based heart failure therapy if there is not significant improvement in LV systolic function could consider referral to advanced heart failure clinic in [place].  2. CAD involving native coronary arteries without angina: He is doing well without any symptoms concerning for angina.  Continue secondary prevention with aspirin, Lipitor, and Coreg.  Following diuresis, optimization of evidence-based heart failure therapy and repeat echo, if his EF remains reduced from baseline we will need to consider further ischemic evaluation.  Given his such dramatic improvement in patient symptoms this has been deferred at this time.  3. Moderate to severe mitral regurgitation: Uncertain if this is related to his significant volume overload noted at the time of echo or a primary problem.  Following adequate outpatient diuresis we will plan to update echo at his next office  visit.  4. Pericardial effusion: No evidence of tamponade physiology on echo.  Vital signs stable.  Remains on IV Lasix as above.  Follow-up echo as outlined above.  5. Hypertensive heart disease with heart failure and CKD stage II: Blood pressure is improved today following diuresis.  Following BMP we will likely transition the patient from losartan to [facility] as outlined above.  Otherwise, he will remain on carvedilol at current dose given his heart rate precludes titration as well as Lasix as above.  6. Hyperlipidemia: LDL of 53 from 12/2017 with normal liver function in 09/2018.  Remains on atorvastatin 40 mg daily.  Disposition: F/u with Dr. Fletcher Anon or an APP in 4 weeks.   Medication Adjustments/Labs and Tests Ordered: Current medicines are reviewed at length with the patient today.  Concerns regarding medicines are outlined above. Medication changes, Labs and Tests ordered today are summarized above and listed in the Patient Instructions accessible in Encounters.   Signed, Christell Faith, PA-C 10/10/2018 3:43 PM     [address], [place] [address] [phone]

## 2018-10-10 ENCOUNTER — Other Ambulatory Visit: Payer: Self-pay

## 2018-10-10 ENCOUNTER — Ambulatory Visit (INDEPENDENT_AMBULATORY_CARE_PROVIDER_SITE_OTHER): Payer: PPO | Admitting: Physician Assistant

## 2018-10-10 ENCOUNTER — Encounter: Payer: Self-pay | Admitting: Physician Assistant

## 2018-10-10 VITALS — BP 130/64 | HR 62 | Ht 69.0 in | Wt 168.0 lb

## 2018-10-10 DIAGNOSIS — I5022 Chronic systolic (congestive) heart failure: Secondary | ICD-10-CM

## 2018-10-10 DIAGNOSIS — I25118 Atherosclerotic heart disease of native coronary artery with other forms of angina pectoris: Secondary | ICD-10-CM | POA: Diagnosis not present

## 2018-10-10 DIAGNOSIS — I272 Pulmonary hypertension, unspecified: Secondary | ICD-10-CM

## 2018-10-10 DIAGNOSIS — I130 Hypertensive heart and chronic kidney disease with heart failure and stage 1 through stage 4 chronic kidney disease, or unspecified chronic kidney disease: Secondary | ICD-10-CM

## 2018-10-10 DIAGNOSIS — I5082 Biventricular heart failure: Secondary | ICD-10-CM

## 2018-10-10 DIAGNOSIS — E785 Hyperlipidemia, unspecified: Secondary | ICD-10-CM

## 2018-10-10 DIAGNOSIS — I340 Nonrheumatic mitral (valve) insufficiency: Secondary | ICD-10-CM

## 2018-10-10 NOTE — Patient Instructions (Signed)
Medication Instructions:   None  If you need a refill on your cardiac medications before your next appointment, please call your pharmacy.   Lab work:  Your physician recommends that you HAVE lab work TODAY: bmet  If you have labs (blood work) drawn today and your tests are completely normal, you will receive your results only by: Marland Kitchen MyChart Message (if you have MyChart) OR . A paper copy in the mail If you have any lab test that is abnormal or we need to change your treatment, we will call you to review the results.  Testing/Procedures:  none  Follow-Up: At [facility], you and your health needs are our priority.  As part of our continuing mission to provide you with exceptional heart care, we have created designated Provider Care Teams.  These Care Teams include your primary Cardiologist (physician) and Advanced Practice Providers (APPs -  Physician Assistants and Nurse Practitioners) who all work together to provide you with the care you need, when you need it. You will need a follow up appointment in 4 weeks.  You may see Kathlyn Sacramento, MD or one of the following Advanced Practice Providers on your designated Care Team:   Murray Hodgkins, NP Christell Faith, PA-C . Marrianne Mood, PA-C

## 2018-10-11 LAB — BASIC METABOLIC PANEL
BUN/Creatinine Ratio: 22 (ref 10–24)
BUN: 22 mg/dL (ref 8–27)
CO2: 27 mmol/L (ref 20–29)
Calcium: 9.3 mg/dL (ref 8.6–10.2)
Chloride: 110 mmol/L — ABNORMAL HIGH (ref 96–106)
Creatinine, Ser: 1 mg/dL (ref 0.76–1.27)
GFR calc Af Amer: 87 mL/min/{1.73_m2} (ref >59)
GFR calc non Af Amer: 75 mL/min/{1.73_m2} (ref >59)
Glucose: 103 mg/dL — ABNORMAL HIGH (ref 65–99)
Potassium: 4.5 mmol/L (ref 3.5–5.2)
Sodium: 148 mmol/L — ABNORMAL HIGH (ref 134–144)

## 2018-10-14 ENCOUNTER — Telehealth: Payer: Self-pay

## 2018-10-14 DIAGNOSIS — I25118 Atherosclerotic heart disease of native coronary artery with other forms of angina pectoris: Secondary | ICD-10-CM

## 2018-10-14 NOTE — Telephone Encounter (Signed)
-----   Message from Rise Mu, PA-C sent at 10/13/2018  9:32 AM EDT ----- With diuresis, renal function has improved and is normal.  Potassium is at goal.  Random glucose is normal.   Given dramatic improvement in weight and volume status following outpatient diuresis, please decrease Lasix to 40 mg daily with KCl 20 mEq daily. Stop losartan and start Entresto 24/26 mg bid (he has not been on an ACEi, so no washout period is needed). He will need recheck BMET 1 week after starting Entresto. Follow up as planned next month for escalation of Entresto and likely addition of spironolactone.

## 2018-10-14 NOTE — Telephone Encounter (Signed)
Patient calling to discuss recent testing results  ° °Please call  ° °

## 2018-10-14 NOTE — Telephone Encounter (Signed)
Called to give the patient lab results and Christell Faith, PA recommendation. lmtcb on both telephone numbers listed for the patient.

## 2018-10-15 NOTE — Telephone Encounter (Signed)
Attempted to call patient. LMTCB 10/15/2018

## 2018-10-16 ENCOUNTER — Other Ambulatory Visit: Payer: Self-pay | Admitting: Cardiovascular Disease

## 2018-10-16 NOTE — Telephone Encounter (Signed)
No answer. Left messages to call back on both numbers listed.

## 2018-10-21 NOTE — Telephone Encounter (Signed)
Several attempt have been made to contact the patient by phone. Letter mailed to the patient to contact the office for results.

## 2018-10-23 ENCOUNTER — Other Ambulatory Visit: Payer: Self-pay | Admitting: Physician Assistant

## 2018-10-29 MED ORDER — FUROSEMIDE 40 MG PO TABS: 40.0000 mg | ORAL_TABLET | Freq: Every day | ORAL | 3 refills | Status: DC

## 2018-10-29 MED ORDER — POTASSIUM CHLORIDE ER 10 MEQ PO TBCR: 20.0000 meq | EXTENDED_RELEASE_TABLET | Freq: Every day | ORAL | 6 refills | Status: DC

## 2018-10-29 MED ORDER — ENTRESTO 24-26 MG PO TABS: 1.0000 | ORAL_TABLET | Freq: Two times a day (BID) | ORAL | 6 refills | Status: DC

## 2018-10-29 NOTE — Addendum Note (Signed)
Addended by: Verlon Au on: 10/29/2018 11:06 AM   Modules accepted: Orders

## 2018-10-29 NOTE — Telephone Encounter (Signed)
Incoming call from patient. He received our letter and was calling r/t results from 10/13/18.  All orders placed and discussed. Pt will go [facility] the medical mall in 1 week to recheck BMET.   Pt has f/u scheduled for 10/5 with Christell Faith, PA.   Pt verbalized understanding and is cooperative with POC.   Advised pt to call for any further questions or concerns.

## 2018-10-30 ENCOUNTER — Telehealth: Payer: Self-pay | Admitting: *Deleted

## 2018-10-30 NOTE — Telephone Encounter (Signed)
Yes, we should keep his appointment so he is not lost to follow up.

## 2018-10-30 NOTE — Telephone Encounter (Signed)
Pt requiring PA for Entresto 24-26 mg tablet bid. PA has been submitted through Covermymeds.  [employer] has received your information, and the request will be reviewed. You may close this dialog, return to your dashboard, and perform other tasks.  You will receive an electronic determination in CoverMyMeds. You can see the latest determination by locating this request on your dashboard or by reopening this request. You will also receive a faxed copy of the determination. If you have any questions please contact [employer] at [phone].

## 2018-10-31 ENCOUNTER — Telehealth: Payer: Self-pay

## 2018-10-31 NOTE — Telephone Encounter (Signed)
Patient & pharmacy notified the prior authorization for Entresto 24 mg-26 mg has been approved through 10/30/2019.

## 2018-11-03 ENCOUNTER — Other Ambulatory Visit: Payer: Self-pay

## 2018-11-03 ENCOUNTER — Inpatient Hospital Stay: Payer: PPO | Attending: Oncology

## 2018-11-03 DIAGNOSIS — E538 Deficiency of other specified B group vitamins: Secondary | ICD-10-CM | POA: Insufficient documentation

## 2018-11-03 MED ORDER — CYANOCOBALAMIN 1000 MCG/ML IJ SOLN
1000.0000 ug | Freq: Once | INTRAMUSCULAR | Status: AC
  Administered 2018-11-03: 1000 ug via INTRAMUSCULAR
  Filled 2018-11-03: qty 1

## 2018-11-07 NOTE — Progress Notes (Signed)
Cardiology Office Note    Date:  11/10/2018   ID:  [name]., DOB January 18, 1946, MRN [id number]  PCP:  Leone Haven, MD  Cardiologist:  Kathlyn Sacramento, MD  Electrophysiologist:  None   Chief Complaint: Follow up  History of Present Illness:   [name]. is a 73 y.o. male with history of CAD status post PCI to the LCx, HFrEF secondary to mixedNICM andICM, pulmonary hypertension, colon cancer status post resection followed by chemotherapy,CKD stage II,hypertension,hyperlipidemia, mitral and tricuspid regurgitation, hypertensive heart disease, diabetes,pancytopeniaandB12 deficiencyanemiarequiring infusionswho presents for follow-up of his CAD and cardiomyopathy.  Patient was diagnosed with heart failure in 02/2015. EF at that time was found to be 20-25% with moderate MR/TR and moderate to severe pulmonary hypertension. He underwent diagnostic R/LHC in 02/2015 that showed pulmonary hypertension, elevated filling pressures, and decreased cardiac output. LCx was found to be 80-90% stenosedwith mild diffuse disease of the LAD and RCA. He was volume overloaded at the time of the cath leading to the recommendation for staged PCI after diuresis and medication optimization. He underwent staged PCI of the LCx in 06/2015. Repeat echo in 01/2016 showed a slightly improved EF of 35-40%, mild MR and left atrial enlargement. His Plavix was stopped in 07/2016 as he was doing well. He was admitted in 08/2017 with ARF secondary to UTI and dehydration with a SCr of 3.16 (1.31 a year prior). He was treated with antibiotics and his Lasix, spironolactone, lisinopril, and amlodipine were held. In follow up, he was placed back on losartan and prn Lasix. Repeat echo in 09/2017 to assess for potential transition from losartan to [facility] showed an improved EF of 40-45%, Gr1DD, moderate MR, and left atrial enlargement.  He was seen in 12/2017 and was doing well, reporting rarely needing to take his  prn Lasix. He was not weighing himself at home. His weight was up 8 pounds since his visit in 08/2017, though he attributed this to increased appetite. He was splitting wood prior to his appointment at that time without limitation. He did note some mild lower extremity edema that was worse with being on his feet and resolved with leg elevation.  More recently, he underwent follow up CT imaging with the cancer center on 08/07/2018 that showed incidental cardiomegaly with trace bilateral pleural effusions with mild interlobular septal thickening in the lower lungs suggestive of pulmonary edema. Inthis setting, it was recommended he follow up.  He was seen on 09/05/2018 noting an approximate 2 month history of worsening lower extremity swelling and two pillow orthopnea.  He reported drinking 3-4 L of water daily.  He had not been taking any Lasix.  He reported compliance with remaining medications.  His weight was up 6 pounds when compared to prior office visit (184-->190 pounds).  He was advised to start taking Lasix 40 mg daily.  Follow up echo on 09/30/2018 showed an EF of 25-30%, mildly dilated LV, diastolic dysfunction, diffuse HK, elevated LA pressure, severely reduced RVSF with mildly enlarged RV cavity, RVSP 82.9 mmHg, severely dilated LA, mildly dilated RA, small circumferential pericardial effusion, moderate to severe MR, tricuspid aortic valve with mild aortic annular calcification.  In this setting, his Lasix was increased to 40 mg bid.  He was seen in follow-up on 10/10/2018 and continued to feel very well.  His weight was down to 168 pounds.  Prior to his visit, he had just finished using a push mower to mow half his lawn without symptoms.  Follow-up labs obtained that day showed improved renal function as outlined below.  In that setting, he was transitioned from losartan to [facility] with recommended BMP in 1 week which was not obtained.  He comes in doing very well from a cardiac perspective.  Weight  remains stable at home between 168 170 pounds.  He denies any chest pain, shortness of breath, palpitations, dizziness, presyncope, syncope, lower extremity swelling, abdominal distention, orthopnea, PND, early satiety.  He does not add extra salt to foods.  He continues to consume large amounts of water though does so in the setting of frequent exertional activities outdoors.  He continues to mow numerous lawns without any symptoms of decompensation or angina.  He is compliant with all medications.  He does not have any issues or concerns at this time.   Labs: 10/2018 - SCr 1.00, K+ 4.5 09/2018 - SCr 1.45, K+ 4.0, A1c 6.2 08/2018 - WBC 2.9, HGB 11.3, PLT 131, K+ 4.2, SCr 1.31, albumin 3.6, AST/ALT normal 12/2017 - TC 110, TG 35, HDL 50, LDL 53 09/2017 - TSH normal  Past Medical History:  Diagnosis Date   Acute renal failure (ARF) ([place]) 08/14/2017   Anemia    CAD (coronary artery disease)    a. 02/2015 Cath: LM nl, LAD 50/40 mid/distal, LCX 80p, RCA 40p, 30d, RPL1 40, CO 3.27, CI 1.65; b. cath 06/09/15: LM minor irregs, m-dLAD 50%, dLAD 40%, pLCx 80% s/p PCI/DES 0%, pRCA 40%, dRCA 30%, 1st RPLB [id number]   Chronic systolic CHF (congestive heart failure) ([place])    a. 02/2015 Echo: EF 20-25%, diff HK, mod MR, mildy dil LA, mildly dil RV w/ mod RV syst dysfxn, mildly dil RA, mod TR, PASP 77mmHg; b. 09/2017 Echo: EF 40-45%, diff HK. Gr1 DD. Mild to mod MR. Mildly to mod dil LA.   Colon cancer ([place])    a. 2015 s/p colectomy followed by chemoRx with oxaliplatin & fluorouacil.   Dia hypertension 07/20/2014   Diabetes mellitus without complication ([place])    Hypertension    Hypertensive heart disease    Mixed Ischemic and Non-ischemic Cardiomyopathy    a. 02/2015 Echo: EF 20-25%, diff HK; b. 09/2016 Echo: EF 40-45%.   Moderate mitral regurgitation    a. 09/2017 Echo: Mild to mod TR.   Tubular adenoma of colon     Past Surgical History:  Procedure Laterality Date   CARDIAC CATHETERIZATION Bilateral  02/24/2015   Procedure: Right/Left Heart Cath and Coronary Angiography;  Surgeon: Wellington Hampshire, MD;  Location: [place] [facility];  Service: Cardiovascular;  Laterality: Bilateral;   CARDIAC CATHETERIZATION N/A 06/09/2015   Procedure: Coronary Stent Intervention;  Surgeon: Wellington Hampshire, MD;  Location: [place] [facility];  Service: Cardiovascular;  Laterality: N/A;   COLON SURGERY     Colectomy   COLONOSCOPY     COLONOSCOPY WITH PROPOFOL N/A 12/11/2016   Procedure: COLONOSCOPY WITH PROPOFOL;  Surgeon: Lollie Sails, MD;  Location: [facility];  Service: Endoscopy;  Laterality: N/A;   ESOPHAGOGASTRODUODENOSCOPY     PORT-A-CATH REMOVAL N/A 07/12/2014   Procedure: REMOVAL PORT-A-CATH;  Surgeon: Molly Maduro, MD;  Location: [facility] OR[facility];  Service: General;  Laterality: N/A;   PORTACATH PLACEMENT Left     Current Medications: Current Meds  Medication Sig   aspirin EC 81 MG tablet Take 1 tablet (81 mg total) by mouth daily.   atorvastatin (LIPITOR) 40 MG tablet TAKE 1 TABLET BY MOUTH ONCE DAILY   furosemide (LASIX) 40 MG tablet Take  1 tablet (40 mg total) by mouth daily.   gabapentin (NEURONTIN) 100 MG capsule Take 2 capsules (200 mg total) by mouth 3 (three) times daily.   metFORMIN (GLUCOPHAGE) 500 MG tablet Take 1 tablet (500 mg total) by mouth 2 (two) times daily with a meal.   potassium chloride (K-DUR) 10 MEQ tablet Take 2 tablets (20 mEq total) by mouth daily. Take 2 tablets (20 meq) by mouth once daily.   sacubitril-valsartan (ENTRESTO) 24-26 MG Take 1 tablet by mouth 2 (two) times daily.   tamsulosin (FLOMAX) 0.4 MG CAPS capsule Take 1 capsule (0.4 mg total) by mouth daily after supper.   [DISCONTINUED] carvedilol (COREG) 3.125 MG tablet TAKE 1 TABLET BY MOUTH TWICE DAILY    Allergies:   Shellfish allergy   Social History   Socioeconomic History   Marital status: Married    Spouse name: Not on file   Number of children: Not on file   Years of  education: Not on file   Highest education level: Not on file  Occupational History   Not on file  Social Needs   Financial resource strain: Not on file   Food insecurity    Worry: Not on file    Inability: Not on file   Transportation needs    Medical: Not on file    Non-medical: Not on file  Tobacco Use   Smoking status: Never Smoker   Smokeless tobacco: Never Used  Substance and Sexual Activity   Alcohol use: No   Drug use: No   Sexual activity: Yes  Lifestyle   Physical activity    Days per week: Not on file    Minutes per session: Not on file   Stress: Not on file  Relationships   Social connections    Talks on phone: Not on file    Gets together: Not on file    Attends religious service: Not on file    Active member of club or organization: Not on file    Attends meetings of clubs or organizations: Not on file    Relationship status: Not on file  Other Topics Concern   Not on file  Social History Narrative   Not on file     Family History:  The patient's family history includes Cancer in his mother.  ROS:   Review of Systems  Constitutional: Negative for chills, diaphoresis, fever, malaise/fatigue and weight loss.  HENT: Negative for congestion.   Eyes: Negative for discharge and redness.  Respiratory: Negative for cough, hemoptysis, sputum production, shortness of breath and wheezing.   Cardiovascular: Negative for chest pain, palpitations, orthopnea, claudication, leg swelling and PND.  Gastrointestinal: Negative for abdominal pain, blood in stool, heartburn, melena, nausea and vomiting.  Genitourinary: Negative for hematuria.  Musculoskeletal: Negative for falls and myalgias.  Skin: Negative for rash.  Neurological: Negative for dizziness, tingling, tremors, sensory change, speech change, focal weakness, loss of consciousness and weakness.  Endo/Heme/Allergies: Does not bruise/bleed easily.  Psychiatric/Behavioral: Negative for substance  abuse. The patient is not nervous/anxious.   All other systems reviewed and are negative.    EKGs/Labs/Other Studies Reviewed:    Studies reviewed were summarized above. The additional studies were reviewed today:  2D Echo 09/2018: 1. The left ventricle has severely reduced systolic function, with an ejection fraction of 25-30%. The cavity size was mildly dilated. There is mildly increased left ventricular wall thickness. Left ventricular diastolic Doppler parameters are  consistent with restrictive filling. Elevated mean left atrial pressure Left  ventricular diffuse hypokinesis. 2. The right ventricle has severely reduced systolic function. The cavity was mildly enlarged. There is no increase in right ventricular wall thickness. Right ventricular systolic pressure is severely elevated with an estimated pressure of 82.9 mmHg. 3. Left atrial size was severely dilated. 4. Right atrial size was mildly dilated. 5. Small pericardial effusion. 6. The pericardial effusion is circumferential. 7. Mitral valve regurgitation is moderate to severe by color flow Doppler. 8. The aortic valve is tricuspid. Mild aortic annular calcification noted. 9. The aorta is normal unless otherwise noted. 10. The inferior vena cava was dilated in size with <50% respiratory variability. 11. The interatrial septum was not well visualized.   EKG:  EKG is ordered today.  The EKG ordered today demonstrates sinus bradycardia, 54 bpm, first-degree AV block, LVH, inferoateral T wave inversion  Recent Labs: 08/11/2018: ALT 24; Hemoglobin 11.3; Platelets 131 10/10/2018: BUN 22; Creatinine, Ser 1.00; Potassium 4.5; Sodium 148  Recent Lipid Panel    Component Value Date/Time   CHOL 110 12/30/2017 1058   CHOL 143 08/02/2016 1110   TRIG 35 12/30/2017 1058   HDL 50 12/30/2017 1058   HDL 69 08/02/2016 1110   CHOLHDL 2.2 12/30/2017 1058   VLDL 7 12/30/2017 1058   LDLCALC 53 12/30/2017 1058   LDLCALC 64 08/02/2016 1110     PHYSICAL EXAM:    VS:  BP 110/60 (BP Location: Left Arm, Patient Position: Sitting, Cuff Size: Normal)    Pulse (!) 54    Ht 5\' 9"  (1.753 m)    Wt 169 lb (76.7 kg)    BMI 24.96 kg/m   BMI: Body mass index is 24.96 kg/m.  Physical Exam  Constitutional: He is oriented to person, place, and time. He appears well-developed and well-nourished.  HENT:  Head: Normocephalic and atraumatic.  Eyes: Right eye exhibits no discharge. Left eye exhibits no discharge.  Neck: Normal range of motion. No JVD present.  Cardiovascular: Normal rate, regular rhythm, S1 normal and S2 normal. Exam reveals no distant heart sounds, no friction rub, no midsystolic click and no opening snap.  Murmur heard. High-pitched blowing holosystolic murmur is present with a grade of 2/6 at the apex. Pulses:      Posterior tibial pulses are 2+ on the right side and 2+ on the left side.  Pulmonary/Chest: Effort normal and breath sounds normal. No respiratory distress. He has no decreased breath sounds. He has no wheezes. He has no rales. He exhibits no tenderness.  Abdominal: Soft. He exhibits no distension. There is no abdominal tenderness.  Musculoskeletal:        General: No edema.     Comments: Changes consistent with woody edema  Neurological: He is alert and oriented to person, place, and time.  Skin: Skin is warm and dry. No cyanosis. Nails show no clubbing.  Psychiatric: He has a normal mood and affect. His speech is normal and behavior is normal. Judgment and thought content normal.    Wt Readings from Last 3 Encounters:  11/10/18 169 lb (76.7 kg)  10/10/18 168 lb (76.2 kg)  09/05/18 190 lb 4 oz (86.3 kg)     ASSESSMENT & PLAN:   1. Chronic combined CHF secondary to mixed ICM and NICM/bi-V failure/pulmonary hypertension: In late 08/2018 the patient was noted to be significant volume overloaded in the setting of copious amounts of water intake and noncompliance with diuretic therapy.  Echo from 09/2018 showed  worsening LV systolic function with an EF of 25 to 30%,  prior 40 to 45%, right-sided pressure 83 mmHg along with mild to moderate mitral regurgitation.  Patient has responded very well to outpatient diuresis with a weight trending from 190 pounds to her baseline weight of 168 to 170 pounds.  Continue current dose Entresto which was recently started at his last visit in 10/2018.  Unfortunately, patient did not follow through with recommended follow-up BMP which will be obtained today.  Given the newly developed first-degree AV block with sinus bradycardia we will discontinue his carvedilol at this time.  Recommend recheck EKG in 2 weeks to reassess PR interval and heart rate.  Following labs checked today would look to either titrate Entresto or add spironolactone with a recheck BMP in 1 week.  Given the patient's lack of compliance with follow-up labs it may be best to have these medication changes be undertaken in 1 week's time and that way when he shows up for his follow-up in 2 weeks we can check labs at that time.  Following optimization of evidence-based heart failure therapy we will need to repeat an echo to reassess his EF, trend his right-sided pressure, and reevaluate his mitral regurgitation.  If EF remains less than prior baseline would look to pursue ischemic evaluation as outlined below followed by referral to advanced heart failure clinic and EP.  CHF education discussed in detail.  2. CAD involving the native coronary arteries without angina: He is doing well without any symptoms concerning for angina.  Continue secondary prevention with aspirin and Lipitor.  Coreg discontinued given sinus bradycardia and newly developed first-degree AV block as outlined above.  Following optimization of evidence-based heart failure therapy and repeat echo in early 12/2018, if his EF remains reduced from baseline we will need to consider further ischemic evaluation.  Given such dramatic improvement in symptoms this  has been deferred at this time.  3. Moderate to severe mitral regurgitation: Plan to reassess with follow-up echo now that he has been adequately diuresed.  4. Pericardial effusion: No evidence of [place] nod physiology on echo.  Vital signs stable.  Remains on Lasix as above.  Follow-up echo as outlined above.  5. Hypertensive heart disease with heart failure and CKD stage II: Blood pressure is well controlled today.  Follow-up BMP today as outlined above.  Given the discontinuation of Coreg secondary to sinus bradycardia and newly developed first-degree AV block we will likely either escalate his Entresto or add spironolactone based on labs obtained today.  6. Hyperlipidemia: LDL of 53 from 12/2017 with normal liver function in 09/2018.  Continue atorvastatin 40 mg daily.  Disposition: F/u with Dr. Fletcher Anon or an APP in 2 weeks.   Medication Adjustments/Labs and Tests Ordered: Current medicines are reviewed at length with the patient today.  Concerns regarding medicines are outlined above. Medication changes, Labs and Tests ordered today are summarized above and listed in the Patient Instructions accessible in Encounters.   Signed, Christell Faith, PA-C 11/10/2018 4:06 PM     [place], [place] [address] [phone]

## 2018-11-10 ENCOUNTER — Ambulatory Visit (INDEPENDENT_AMBULATORY_CARE_PROVIDER_SITE_OTHER): Payer: PPO | Admitting: Physician Assistant

## 2018-11-10 ENCOUNTER — Other Ambulatory Visit: Payer: Self-pay

## 2018-11-10 ENCOUNTER — Encounter: Payer: Self-pay | Admitting: Physician Assistant

## 2018-11-10 VITALS — BP 110/60 | HR 54 | Ht 69.0 in | Wt 169.0 lb

## 2018-11-10 DIAGNOSIS — I130 Hypertensive heart and chronic kidney disease with heart failure and stage 1 through stage 4 chronic kidney disease, or unspecified chronic kidney disease: Secondary | ICD-10-CM

## 2018-11-10 DIAGNOSIS — Z79899 Other long term (current) drug therapy: Secondary | ICD-10-CM

## 2018-11-10 DIAGNOSIS — I5082 Biventricular heart failure: Secondary | ICD-10-CM | POA: Diagnosis not present

## 2018-11-10 DIAGNOSIS — E785 Hyperlipidemia, unspecified: Secondary | ICD-10-CM | POA: Diagnosis not present

## 2018-11-10 DIAGNOSIS — I255 Ischemic cardiomyopathy: Secondary | ICD-10-CM

## 2018-11-10 DIAGNOSIS — I428 Other cardiomyopathies: Secondary | ICD-10-CM

## 2018-11-10 DIAGNOSIS — I2510 Atherosclerotic heart disease of native coronary artery without angina pectoris: Secondary | ICD-10-CM | POA: Diagnosis not present

## 2018-11-10 DIAGNOSIS — I5022 Chronic systolic (congestive) heart failure: Secondary | ICD-10-CM

## 2018-11-10 DIAGNOSIS — I272 Pulmonary hypertension, unspecified: Secondary | ICD-10-CM | POA: Diagnosis not present

## 2018-11-10 NOTE — Patient Instructions (Signed)
Medication Instructions:  Your physician has recommended you make the following change in your medication:  1- STOP Carvedilol.  If you need a refill on your cardiac medications before your next appointment, please call your pharmacy.   Lab work: Your physician recommends that you return for lab work in: [place] [facility].  If you have labs (blood work) drawn today and your tests are completely normal, you will receive your results only by: Marland Kitchen MyChart Message (if you have MyChart) OR . A paper copy in the mail If you have any lab test that is abnormal or we need to change your treatment, we will call you to review the results.  Testing/Procedures: NONE  Follow-Up: At [facility], you and your health needs are our priority.  As part of our continuing mission to provide you with exceptional heart care, we have created designated Provider Care Teams.  These Care Teams include your primary Cardiologist (physician) and Advanced Practice Providers (APPs -  Physician Assistants and Nurse Practitioners) who all work together to provide you with the care you need, when you need it. You will need a follow up appointment in 2 weeks with Thurmond Butts.  You may see Kathlyn Sacramento, MD or one of the following Advanced Practice Providers on your designated Care Team:   Murray Hodgkins, NP Christell Faith, PA-C . Marrianne Mood, PA-C

## 2018-11-11 ENCOUNTER — Telehealth: Payer: Self-pay | Admitting: *Deleted

## 2018-11-11 LAB — BASIC METABOLIC PANEL
BUN/Creatinine Ratio: 16 (ref 10–24)
BUN: 28 mg/dL — ABNORMAL HIGH (ref 8–27)
CO2: 26 mmol/L (ref 20–29)
Calcium: 9.1 mg/dL (ref 8.6–10.2)
Chloride: 106 mmol/L (ref 96–106)
Creatinine, Ser: 1.79 mg/dL — ABNORMAL HIGH (ref 0.76–1.27)
GFR calc Af Amer: 43 mL/min/{1.73_m2} — ABNORMAL LOW (ref >59)
GFR calc non Af Amer: 37 mL/min/{1.73_m2} — ABNORMAL LOW (ref >59)
Glucose: 108 mg/dL — ABNORMAL HIGH (ref 65–99)
Potassium: 3.9 mmol/L (ref 3.5–5.2)
Sodium: 147 mmol/L — ABNORMAL HIGH (ref 134–144)

## 2018-11-11 MED ORDER — FUROSEMIDE 40 MG PO TABS: 20.0000 mg | ORAL_TABLET | Freq: Every day | ORAL | 3 refills | Status: DC

## 2018-11-11 NOTE — Telephone Encounter (Signed)
Results called to pt. Pt verbalized understanding. He read back and verbalized the instructions for the lasix. Med list updated. He is scheduled to come back to office on 11/25/18.

## 2018-11-11 NOTE — Telephone Encounter (Signed)
-----   Message from Rise Mu, PA-C sent at 11/11/2018  7:31 AM EDT ----- Renal function indicates he is dry. Hold Lasix for 2 days then resume at lower dose 20 mg daily. We will hold off on escalation of Entresto or addition of spironolactone at this time until his follow up in 2 weeks given renal function.

## 2018-11-13 NOTE — Progress Notes (Signed)
Cardiology Office Note    Date:  11/25/2018   ID:  Christopher Evaristo., DOB April 03, 1945, MRN [id number]  PCP:  Leone Haven, MD  Cardiologist:  Kathlyn Sacramento, MD  Electrophysiologist:  None   Chief Complaint: Follow up  History of Present Illness:   Christopher Lavers. is a 73 y.o. male with history of CAD status post PCI to the LCx, HFrEF secondary to mixedNICM andICM, pulmonary hypertension, colon cancer status post resection followed by chemotherapy,CKD stage II,hypertension,hyperlipidemia, mitral and tricuspid regurgitation, hypertensive heart disease, diabetes,pancytopeniaandB12 deficiencyanemiarequiring infusionswho presents for follow-up of his CAD and cardiomyopathy.  Patient was diagnosed with heart failure in 02/2015. EF at that time was found to be 20-25% with moderate MR/TR and moderate to severe pulmonary hypertension. He underwent diagnosticR/LHCin 02/2015 that showed pulmonary hypertension, elevated filling pressures, and decreased cardiac output. LCx was found to be 80-90% stenosedwith mild diffuse disease of the LAD and RCA. He was volume overloaded at the time of the cath leading to the recommendation for staged PCI after diuresis and medication optimization. He underwent staged PCI of the LCx in 06/2015. Repeat echo in 01/2016 showed a slightly improved EF of 35-40%, mild MR and left atrial enlargement. His Plavix was stopped in 07/2016 as he was doing well. He was admitted in 08/2017 with ARF secondary to UTI and dehydration with a SCr of 3.16 (1.31 a year prior). He was treated with antibiotics and his Lasix, spironolactone, lisinopril, and amlodipine were held. In follow up, he was placed back on losartan and prn Lasix. Repeat echo in 09/2017 to assess for potential transition from losartan to [facility] showed an improved EF of 40-45%, Gr1DD, moderate MR, and left atrial enlargement.He was seen in11/2019 and was doing well, reporting rarely needing to take his  prn Lasix. He was not weighing himself at home. His weight was up 8 pounds since his visit in 08/2017, though he attributed this to increased appetite. He was splitting wood prior to his appointment at that time without limitation. He did note some mild lower extremity edema that was worse with being on his feet and resolved with leg elevation. More recently, he underwent follow up CT imaging with the cancer center on 08/07/2018 that showed incidental cardiomegaly with trace bilateral pleural effusions with mild interlobular septal thickening in the lower lungs suggestive of pulmonary edema. Inthis setting, it was recommended he follow up.He was seen on 09/05/2018 noting an approximate 2 month history of worsening lower extremity swelling and two pillow orthopnea. He reported drinking 3-4 L of water daily. He had not been taking any Lasix. He reported compliance with remaining medications. His weight was up 6 pounds when compared to prior office visit (184-->190 pounds). He was advised to start taking Lasix 40 mg daily. Follow up echo on 09/30/2018 showed an EF of 25-30%, mildly dilated LV, diastolic dysfunction, diffuse HK, elevated LA pressure, severely reduced RVSF with mildly enlarged RV cavity, RVSP 82.9 mmHg, severely dilated LA, mildly dilated RA, small circumferential pericardial effusion, moderate to severe MR, tricuspid aortic valve with mild aortic annular calcification. In this setting, his Lasix was increased to 40 mg bid.  He was seen in follow-up on 10/10/2018 and continued to feel very well.  His weight was down to 168 pounds.  Prior to his visit, he had just finished using a push mower to mow half his lawn without symptoms.  Follow-up labs obtained that day showed improved renal function as outlined below.  In that  setting, he was transitioned from losartan to [facility] with recommended BMP in 1 week which was not obtained.  He was most recently seen in the office on 11/10/2018 and continued to do  very well.  His weight was stable between 168-170 pounds and denied any symptoms of angina or volume overload.  Labs checked at that time showed worsening renal function with a SCr of 1.79.  In this setting, his Lasix was held for 2 days and resumed at lower dose 20 mg daily.   He comes in continuing to do well from a cardiac perspective.  His weight is up 7 pounds compared to his last office visit with a current weight of 176.8 with a prior weight of 169 pounds.  He denies any increase shortness of breath, abdominal distention, lower extremity swelling, orthopnea, PND, early satiety.  He denies any changes to his diet.  He does report compliance with medications, including recently reduced dose of Lasix secondary to AKI following his last office visit.  He continues to drink large volumes of water despite the weather now being cooler and in the setting of the patient doing less exertional activity/sweating outdoors.  He denies any chest pain, dizziness, presyncope, or syncope.   Labs: 11/2018 - SCr 1.79, K+ 3.9 10/2018 - SCr 1.00, K+ 4.5 09/2018 - SCr 1.45, K+ 4.0, A1c 6.2 08/2018 - WBC 2.9, HGB 11.3, PLT 131, K+ 4.2, SCr 1.31, albumin 3.6, AST/ALT normal 12/2017 - TC 110, TG 35, HDL 50, LDL 53 09/2017 - TSHnormal  Past Medical History:  Diagnosis Date   Acute renal failure (ARF) ([place]) 08/14/2017   Anemia    CAD (coronary artery disease)    a. 02/2015 Cath: LM nl, LAD 50/40 mid/distal, LCX 80p, RCA 40p, 30d, RPL1 40, CO 3.27, CI 1.65; b. cath 06/09/15: LM minor irregs, m-dLAD 50%, dLAD 40%, pLCx 80% s/p PCI/DES 0%, pRCA 40%, dRCA 30%, 1st RPLB [id number]   Chronic systolic CHF (congestive heart failure) ([place])    a. 02/2015 Echo: EF 20-25%, diff HK, mod MR, mildy dil LA, mildly dil RV w/ mod RV syst dysfxn, mildly dil RA, mod TR, PASP 59mmHg; b. 09/2017 Echo: EF 40-45%, diff HK. Gr1 DD. Mild to mod MR. Mildly to mod dil LA.   Colon cancer ([place])    a. 2015 s/p colectomy followed by chemoRx with  oxaliplatin & fluorouacil.   Dia hypertension 07/20/2014   Diabetes mellitus without complication ([place])    Hypertension    Hypertensive heart disease    Mixed Ischemic and Non-ischemic Cardiomyopathy    a. 02/2015 Echo: EF 20-25%, diff HK; b. 09/2016 Echo: EF 40-45%.   Moderate mitral regurgitation    a. 09/2017 Echo: Mild to mod TR.   Tubular adenoma of colon     Past Surgical History:  Procedure Laterality Date   CARDIAC CATHETERIZATION Bilateral 02/24/2015   Procedure: Right/Left Heart Cath and Coronary Angiography;  Surgeon: Wellington Hampshire, MD;  Location: [place] [facility] LAB;  Service: Cardiovascular;  Laterality: Bilateral;   CARDIAC CATHETERIZATION N/A 06/09/2015   Procedure: Coronary Stent Intervention;  Surgeon: Wellington Hampshire, MD;  Location: [place] CV LAB;  Service: Cardiovascular;  Laterality: N/A;   COLON SURGERY     Colectomy   COLONOSCOPY     COLONOSCOPY WITH PROPOFOL N/A 12/11/2016   Procedure: COLONOSCOPY WITH PROPOFOL;  Surgeon: Lollie Sails, MD;  Location[facility] END[facility]COPY;  Service: Endoscopy;  Laterality: N/A;   ESOPHAGOGASTRODUODENOSCOPY     PORT-A-CATH REMOVAL N/A  07/12/2014   Procedure: REMOVAL PORT-A-CATH;  Surgeon: Molly Maduro, MD;  Location: [facility] ORS;  Service: General;  Laterality: N/A;   PORTACATH PLACEMENT Left     Current Medications: Current Meds  Medication Sig   aspirin EC 81 MG tablet Take 1 tablet (81 mg total) by mouth daily.   atorvastatin (LIPITOR) 40 MG tablet Take 1 tablet (40 mg total) by mouth daily.   furosemide (LASIX) 40 MG tablet Take 0.5 tablets (20 mg total) by mouth daily.   gabapentin (NEURONTIN) 100 MG capsule Take 2 capsules (200 mg total) by mouth 3 (three) times daily.   metFORMIN (GLUCOPHAGE) 500 MG tablet Take 1 tablet (500 mg total) by mouth 2 (two) times daily with a meal.   potassium chloride (K-DUR) 10 MEQ tablet Take 2 tablets (20 mEq total) by mouth daily. Take 2 tablets (20 meq) by mouth  once daily.   sacubitril-valsartan (ENTRESTO) 24-26 MG Take 1 tablet by mouth 2 (two) times daily.   tamsulosin (FLOMAX) 0.4 MG CAPS capsule Take 1 capsule (0.4 mg total) by mouth daily after supper.    Allergies:   Shellfish allergy   Social History   Socioeconomic History   Marital status: Married    Spouse name: Not on file   Number of children: Not on file   Years of education: Not on file   Highest education level: Not on file  Occupational History   Not on file  Social Needs   Financial resource strain: Not on file   Food insecurity    Worry: Not on file    Inability: Not on file   Transportation needs    Medical: Not on file    Non-medical: Not on file  Tobacco Use   Smoking status: Never Smoker   Smokeless tobacco: Never Used  Substance and Sexual Activity   Alcohol use: No   Drug use: No   Sexual activity: Yes  Lifestyle   Physical activity    Days per week: Not on file    Minutes per session: Not on file   Stress: Not on file  Relationships   Social connections    Talks on phone: Not on file    Gets together: Not on file    Attends religious service: Not on file    Active member of club or organization: Not on file    Attends meetings of clubs or organizations: Not on file    Relationship status: Not on file  Other Topics Concern   Not on file  Social History Narrative   Not on file     Family History:  The patient's family history includes Cancer in his mother.  ROS:   Review of Systems  Constitutional: Negative for chills, diaphoresis, fever, malaise/fatigue and weight loss.  HENT: Negative for congestion.   Eyes: Negative for discharge and redness.  Respiratory: Negative for cough, hemoptysis, sputum production, shortness of breath and wheezing.   Cardiovascular: Negative for chest pain, palpitations, orthopnea, claudication, leg swelling and PND.  Gastrointestinal: Negative for abdominal pain, blood in stool, heartburn,  melena, nausea and vomiting.  Genitourinary: Negative for hematuria.  Musculoskeletal: Negative for falls and myalgias.  Skin: Negative for rash.  Neurological: Negative for dizziness, tingling, tremors, sensory change, speech change, focal weakness, loss of consciousness and weakness.  Endo/Heme/Allergies: Does not bruise/bleed easily.  Psychiatric/Behavioral: Negative for substance abuse. The patient is not nervous/anxious.   All other systems reviewed and are negative.    EKGs/Labs/Other Studies Reviewed:  Studies reviewed were summarized above. The additional studies were reviewed today:  2D Echo 09/2018: 1. The left ventricle has severely reduced systolic function, with an ejection fraction of 25-30%. The cavity size was mildly dilated. There is mildly increased left ventricular wall thickness. Left ventricular diastolic Doppler parameters are  consistent with restrictive filling. Elevated mean left atrial pressure Left ventricular diffuse hypokinesis. 2. The right ventricle has severely reduced systolic function. The cavity was mildly enlarged. There is no increase in right ventricular wall thickness. Right ventricular systolic pressure is severely elevated with an estimated pressure of 82.9 mmHg. 3. Left atrial size was severely dilated. 4. Right atrial size was mildly dilated. 5. Small pericardial effusion. 6. The pericardial effusion is circumferential. 7. Mitral valve regurgitation is moderate to severe by color flow Doppler. 8. The aortic valve is tricuspid. Mild aortic annular calcification noted. 9. The aorta is normal unless otherwise noted. 10. The inferior vena cava was dilated in size with <50% respiratory variability. 11. The interatrial septum was not well visualized.   EKG:  EKG is ordered today.  The EKG ordered today demonstrates NSR, 67 bpm, first-degree AV block, occasional PVCs, nonspecific ST-T changes  Recent Labs: 08/11/2018: ALT 24; Hemoglobin  11.3; Platelets 131 11/10/2018: BUN 28; Creatinine, Ser 1.79; Potassium 3.9; Sodium 147  Recent Lipid Panel    Component Value Date/Time   CHOL 110 12/30/2017 1058   CHOL 143 08/02/2016 1110   TRIG 35 12/30/2017 1058   HDL 50 12/30/2017 1058   HDL 69 08/02/2016 1110   CHOLHDL 2.2 12/30/2017 1058   VLDL 7 12/30/2017 1058   LDLCALC 53 12/30/2017 1058   LDLCALC 64 08/02/2016 1110    PHYSICAL EXAM:    VS:  BP (!) 150/60 (BP Location: Left Arm, Patient Position: Sitting, Cuff Size: Normal)    Pulse 67    Temp (!) 97.5 F (36.4 C)    Ht 5\' 9"  (1.753 m)    Wt 176 lb 8 oz (80.1 kg)    BMI 26.06 kg/m   BMI: Body mass index is 26.06 kg/m.  Physical Exam  Constitutional: He is oriented to person, place, and time. He appears well-developed and well-nourished.  HENT:  Head: Normocephalic and atraumatic.  Eyes: Right eye exhibits no discharge. Left eye exhibits no discharge.  Neck: Normal range of motion. No JVD present.  Cardiovascular: Normal rate, regular rhythm, S1 normal, S2 normal and normal heart sounds. Exam reveals no distant heart sounds, no friction rub, no midsystolic click and no opening snap.  No murmur heard. Pulses:      Posterior tibial pulses are 2+ on the right side and 2+ on the left side.  Pulmonary/Chest: Effort normal and breath sounds normal. No respiratory distress. He has no decreased breath sounds. He has no wheezes. He has no rales. He exhibits no tenderness.  Abdominal: Soft. He exhibits no distension. There is no abdominal tenderness.  Musculoskeletal:        General: No edema.  Neurological: He is alert and oriented to person, place, and time.  Skin: Skin is warm and dry. No cyanosis. Nails show no clubbing.  Psychiatric: He has a normal mood and affect. His speech is normal and behavior is normal. Judgment and thought content normal.   ReDs vest: 36%  Wt Readings from Last 3 Encounters:  11/25/18 176 lb 8 oz (80.1 kg)  11/10/18 169 lb (76.7 kg)  10/10/18  168 lb (76.2 kg)     ASSESSMENT & PLAN:  1. Mild acute on chronic combined systolic and diastolic CHF secondary to mixed ICM and NICM/BiV failure/pulmonary hypertension: His weight is up 7 pounds compared to his last visit earlier this month with a ReDs vest reading of 36% in the office today.  He continues to consume large amounts of water despite the weather being cooler and in the setting of the patient not sweating as much.  His Lasix was recently reduced to 20 mg daily in the setting of AKI following his last office visit.  We will continue to need to see him back on a frequent basis, approximately every 2 weeks, in the setting of the patient not showing up for follow-up labs nor answering his phone with changes recommended based on labs drawn in the office.  This is in an effort to optimize his medication regimen as well as to diurese at this time.  We will have the patient take Lasix 40 mg daily x3 days followed by 20 mg and 40 mg daily alternating thereafter.  For now, he will remain on KCl 20 mEq daily.  Check BMP today.  Continue current dose of Entresto.  No longer on beta-blocker secondary to bradycardic heart rates and development of first-degree AV block.  CHF education discussed in detail.  Following diuresis and maximally tolerated titration of evidence-based heart failure therapy we will plan to repeat an echocardiogram.  If his EF remains less than prior baseline he will need to undergo ischemic evaluation which has been deferred at this time given lack of anginal symptoms.  He will also need referral to EP and advanced heart failure clinic.  2. CAD involving the native coronary arteries without angina: No symptoms concerning for angina.  Continue secondary prevention with aspirin and Lipitor.  No longer on a beta-blocker as outlined above.  Following repeat echo after optimization of evidence-based therapy would consider further ischemic evaluation.  Again, this has been deferred at this  time given lack of anginal symptoms with preference to optimize medications initially.  3. Moderate to severe mitral regurgitation: Follow-up on repeat echo.  4. Pericardial effusion: No evidence of tamponade physiology.  Vital signs stable.  5. Hypertensive heart disease: Blood pressure is mildly elevated today with evidence of volume overload noted on fast reading as above.  Titrate Lasix as above.  Otherwise, continue current dose of Entresto.  6. Hyperlipidemia: LDL of 53 from 12/2017 with normal liver function in 09/2018.  Remains on atorvastatin 40 mg daily.  Disposition: F/u with Dr. Fletcher Anon or an APP in 2 weeks.   Medication Adjustments/Labs and Tests Ordered: Current medicines are reviewed at length with the patient today.  Concerns regarding medicines are outlined above. Medication changes, Labs and Tests ordered today are summarized above and listed in the Patient Instructions accessible in Encounters.   Signed, Christell Faith, PA-C 11/25/2018 2:03 PM     [facility] [address] [place], [place] [address] [phone]

## 2018-11-17 ENCOUNTER — Other Ambulatory Visit: Payer: Self-pay

## 2018-11-17 MED ORDER — ATORVASTATIN CALCIUM 40 MG PO TABS: 40.0000 mg | ORAL_TABLET | Freq: Every day | ORAL | 0 refills | Status: DC

## 2018-11-25 ENCOUNTER — Ambulatory Visit (INDEPENDENT_AMBULATORY_CARE_PROVIDER_SITE_OTHER): Payer: PPO | Admitting: Physician Assistant

## 2018-11-25 ENCOUNTER — Encounter: Payer: Self-pay | Admitting: Physician Assistant

## 2018-11-25 ENCOUNTER — Other Ambulatory Visit: Payer: Self-pay

## 2018-11-25 VITALS — BP 150/60 | HR 67 | Temp 97.5°F | Ht 69.0 in | Wt 176.5 lb

## 2018-11-25 DIAGNOSIS — I2510 Atherosclerotic heart disease of native coronary artery without angina pectoris: Secondary | ICD-10-CM

## 2018-11-25 DIAGNOSIS — E785 Hyperlipidemia, unspecified: Secondary | ICD-10-CM | POA: Diagnosis not present

## 2018-11-25 DIAGNOSIS — I130 Hypertensive heart and chronic kidney disease with heart failure and stage 1 through stage 4 chronic kidney disease, or unspecified chronic kidney disease: Secondary | ICD-10-CM

## 2018-11-25 DIAGNOSIS — I272 Pulmonary hypertension, unspecified: Secondary | ICD-10-CM | POA: Diagnosis not present

## 2018-11-25 DIAGNOSIS — I340 Nonrheumatic mitral (valve) insufficiency: Secondary | ICD-10-CM

## 2018-11-25 DIAGNOSIS — I5082 Biventricular heart failure: Secondary | ICD-10-CM | POA: Diagnosis not present

## 2018-11-25 DIAGNOSIS — I5043 Acute on chronic combined systolic (congestive) and diastolic (congestive) heart failure: Secondary | ICD-10-CM | POA: Diagnosis not present

## 2018-11-25 MED ORDER — FUROSEMIDE 40 MG PO TABS: ORAL_TABLET | ORAL | 3 refills | Status: DC

## 2018-11-25 NOTE — Patient Instructions (Signed)
Medication Instructions:  1- INCREASE Lasix to 1 tablet (40 mg total) for 3 days, then Take 1 tablet (40 mg total) on M,W,F, then take 0.5 tablet (20 mg total) on other days thereafter. *If you need a refill on your cardiac medications before your next appointment, please call your pharmacy*  Lab Work: Your physician recommends that you have lab work today(BMET)  If you have labs (blood work) drawn today and your tests are completely normal, you will receive your results only by: Marland Kitchen MyChart Message (if you have MyChart) OR . A paper copy in the mail If you have any lab test that is abnormal or we need to change your treatment, we will call you to review the results.  Testing/Procedures: None ordered   Follow-Up: At [facility], you and your health needs are our priority.  As part of our continuing mission to provide you with exceptional heart care, we have created designated Provider Care Teams.  These Care Teams include your primary Cardiologist (physician) and Advanced Practice Providers (APPs -  Physician Assistants and Nurse Practitioners) who all work together to provide you with the care you need, when you need it.  Your next appointment:   2 weeks  The format for your next appointment:   In Person  Provider:    You may see Kathlyn Sacramento, MD or Christell Faith, PA-C.

## 2018-11-26 ENCOUNTER — Telehealth: Payer: Self-pay

## 2018-11-26 LAB — BASIC METABOLIC PANEL
BUN/Creatinine Ratio: 13 (ref 10–24)
BUN: 16 mg/dL (ref 8–27)
CO2: 25 mmol/L (ref 20–29)
Calcium: 9 mg/dL (ref 8.6–10.2)
Chloride: 106 mmol/L (ref 96–106)
Creatinine, Ser: 1.27 mg/dL (ref 0.76–1.27)
GFR calc Af Amer: 65 mL/min/{1.73_m2} (ref >59)
GFR calc non Af Amer: 56 mL/min/{1.73_m2} — ABNORMAL LOW (ref >59)
Glucose: 100 mg/dL — ABNORMAL HIGH (ref 65–99)
Potassium: 4 mmol/L (ref 3.5–5.2)
Sodium: 144 mmol/L (ref 134–144)

## 2018-11-26 NOTE — Telephone Encounter (Signed)
Patient made aware of lab results with verbalized understanding. 

## 2018-11-26 NOTE — Telephone Encounter (Signed)
-----   Message from Rise Mu, PA-C sent at 11/26/2018  7:08 AM EDT ----- Renal function has improved back to his approximate baseline. Potassium at goal. No changes compared to our discussion at his office visit. Follow-up as planned.

## 2018-12-01 ENCOUNTER — Other Ambulatory Visit: Payer: Self-pay

## 2018-12-01 ENCOUNTER — Inpatient Hospital Stay: Payer: PPO | Attending: Oncology

## 2018-12-01 DIAGNOSIS — E538 Deficiency of other specified B group vitamins: Secondary | ICD-10-CM | POA: Insufficient documentation

## 2018-12-01 MED ORDER — CYANOCOBALAMIN 1000 MCG/ML IJ SOLN
1000.0000 ug | INTRAMUSCULAR | Status: DC
  Administered 2018-12-01: 1000 ug via INTRAMUSCULAR

## 2018-12-07 NOTE — Progress Notes (Signed)
Cardiology Office Note    Date:  12/09/2018   ID:  Christopher Stotz., DOB July 17, 1945, MRN [id number]  PCP:  Leone Haven, MD  Cardiologist:  Kathlyn Sacramento, MD  Electrophysiologist:  None   Chief Complaint: Follow up  History of Present Illness:   Christopher Weyman. is a 72 y.o. male with history of CAD status post PCI to the LCx, HFrEF secondary to mixedNICM andICM, pulmonary hypertension, colon cancer status post resection followed by chemotherapy,CKD stage II,hypertension,hyperlipidemia, mitral and tricuspid regurgitation, hypertensive heart disease, diabetes,pancytopeniaandB12 deficiencyanemiarequiring infusionswho presents for follow-up of his CAD and cardiomyopathy.  Patient was diagnosed with heart failure in 02/2015. EF at that time was found to be 20-25% with moderate MR/TR and moderate to severe pulmonary hypertension. He underwent diagnosticR/LHCin 02/2015 that showed pulmonary hypertension, elevated filling pressures, and decreased cardiac output. LCx was found to be 80-90% stenosedwith mild diffuse disease of the LAD and RCA. He was volume overloaded at the time of the cath leading to the recommendation for staged PCI after diuresis and medication optimization. He underwent staged PCI of the LCx in 06/2015. Repeat echo in 01/2016 showed a slightly improved EF of 35-40%, mild MR and left atrial enlargement. His Plavix was stopped in 07/2016 as he was doing well. He was admitted in 08/2017 with ARF secondary to UTI and dehydration with a SCr of 3.16 (1.31 a year prior). He was treated with antibiotics and his Lasix, spironolactone, lisinopril, and amlodipine were held. In follow up, he was placed back on losartan and prn Lasix. Repeat echo in 09/2017 to assess for potential transition from losartan to [employer] showed an improved EF of 40-45%, Gr1DD, moderate MR, and left atrial enlargement.He was seen in11/2019 and was doing well, reporting rarely needing to take his  prn Lasix. He was not weighing himself at home. His weight was up 8 pounds since his visit in 08/2017, though he attributed this to increased appetite. He was splitting wood prior to his appointment at that time without limitation. He did note some mild lower extremity edema that was worse with being on his feet and resolved with leg elevation. More recently, he underwent follow up CT imaging with the cancer center on 08/07/2018 that showed incidental cardiomegaly with trace bilateral pleural effusions with mild interlobular septal thickening in the lower lungs suggestive of pulmonary edema. Inthis setting, it was recommended he follow up.He was seen on 09/05/2018 noting an approximate 2 month history of worsening lower extremity swelling and two pillow orthopnea. He reported drinking 3-4 L of water daily. He had not been taking any Lasix. He reported compliance with remaining medications. His weight was up 6 pounds when compared to prior office visit (184-->190 pounds). He was advised to start taking Lasix 40 mg daily. Follow up echo on 09/30/2018 showed an EF of 25-30%, mildly dilated LV, diastolic dysfunction, diffuse HK, elevated LA pressure, severely reduced RVSF with mildly enlarged RV cavity, RVSP 82.9 mmHg, severely dilated LA, mildly dilated RA, small circumferential pericardial effusion, moderate to severe MR, tricuspid aortic valve with mild aortic annular calcification. In this setting, his Lasix was increased to 40 mg bid.He was seen in follow-up on 10/10/2018 and continued to feel very well. His weight was down to 168 pounds. Prior to his visit, he had just finished using a push mower to mow half his lawn without symptoms. Follow-up labs obtained that day showed improved renal function as outlined below. In that setting, he was transitioned from losartan  to [facility] with recommended BMP in 1 week which was not obtained.  He was most recently seen in the office on 11/10/2018 and continued to do  very well.  His weight was stable between 168-170 pounds and denied any symptoms of angina or volume overload.  Labs checked at that time showed worsening renal function with a SCr of 1.79.  In this setting, his Lasix was held for 2 days and resumed at lower dose 20 mg daily.  He was seen in follow up on 11/25/2018 and was doing well. His weight was up 7 pounds compared to his prior visit (169-->176 pounds).  He denied any changes in his diet and reported compliance with medications.  He continued to drink large volumes of water.  ReDs vest reading of 36% at that visit.  He was advised to take Lasix 40 mg daily x 3 days, then resume Lasix at alternating doses of 20 mg and 40 mg daily thereafter.    He comes in doing very well from a cardiac perspective.  He notes complete resolution of lower extremity swelling.  No chest pain, palpitations, dizziness, presyncope, syncope.  No abdominal distention, orthopnea, PND, or early satiety.  He continues to drink large amounts of water which she totals to be approximately 3 L/day.  He continues to have a very active lifestyle without any significant cardiac limitation.  He reports compliance with all medications including alternating doses of Lasix.  Weight has remained stable at home.  No falls, BRBPR, or melena.  He is not adding salt to his food.  He does not have any issues or concerns today.   Labs: 11/2018 - BUN 16, SCr 1.27, K+ 4.0 11/2018 - SCr 1.79, K+ 3.9 10/2018- SCr 1.00, K+ 4.5 09/2018 - SCr 1.45, K+ 4.0, A1c 6.2 08/2018 - WBC 2.9, HGB 11.3, PLT 131, K+ 4.2, SCr 1.31, albumin 3.6, AST/ALT normal 12/2017 - TC 110, TG 35, HDL 50, LDL 53 09/2017 - TSHnormal  Past Medical History:  Diagnosis Date  . Acute renal failure (ARF) ([place]) 08/14/2017  . Anemia   . CAD (coronary artery disease)    a. 02/2015 Cath: LM nl, LAD 50/40 mid/distal, LCX 80p, RCA 40p, 30d, RPL1 40, CO 3.27, CI 1.65; b. cath 06/09/15: LM minor irregs, m-dLAD 50%, dLAD 40%, pLCx 80% s/p  PCI/DES 0%, pRCA 40%, dRCA 30%, 1st RPLB 40%  . Chronic systolic CHF (congestive heart failure) ([place])    a. 02/2015 Echo: EF 20-25%, diff HK, mod MR, mildy dil LA, mildly dil RV w/ mod RV syst dysfxn, mildly dil RA, mod TR, PASP 19mmHg; b. 09/2017 Echo: EF 40-45%, diff HK. Gr1 DD. Mild to mod MR. Mildly to mod dil LA.  . Colon cancer ([place])    a. 2015 s/p colectomy followed by chemoRx with oxaliplatin & fluorouacil.  . Dia hypertension 07/20/2014  . Diabetes mellitus without complication ([place])   . Hypertension   . Hypertensive heart disease   . Mixed Ischemic and Non-ischemic Cardiomyopathy    a. 02/2015 Echo: EF 20-25%, diff HK; b. 09/2016 Echo: EF 40-45%.  . Moderate mitral regurgitation    a. 09/2017 Echo: Mild to mod TR.  . Tubular adenoma of colon     Past Surgical History:  Procedure Laterality Date  . CARDIAC CATHETERIZATION Bilateral 02/24/2015   Procedure: Right/Left Heart Cath and Coronary Angiography;  Surgeon: Wellington Hampshire, MD;  Location: [place] [facility];  Service: Cardiovascular;  Laterality: Bilateral;  . CARDIAC CATHETERIZATION N/A 06/09/2015  Procedure: Coronary Stent Intervention;  Surgeon: Wellington Hampshire, MD;  Location: [place] [facility];  Service: Cardiovascular;  Laterality: N/A;  . COLON SURGERY     Colectomy  . COLONOSCOPY    . COLONOSCOPY WITH PROPOFOL N/A 12/11/2016   Procedure: COLONOSCOPY WITH PROPOFOL;  Surgeon: Lollie Sails, MD;  Location: [facility] ENDOSCOPY;  Service: Endoscopy;  Laterality: N/A;  . ESOPHAGOGASTRODUODENOSCOPY    . PORT-A-CATH REMOVAL N/A 07/12/2014   Procedure: REMOVAL PORT-A-CATH;  Surgeon: Molly Maduro, MD;  Location: [facility] ORS;  Service: General;  Laterality: N/A;  . PORTACATH PLACEMENT Left     Current Medications: Current Meds  Medication Sig  . aspirin EC 81 MG tablet Take 1 tablet (81 mg total) by mouth daily.  Marland Kitchen atorvastatin (LIPITOR) 40 MG tablet Take 1 tablet (40 mg total) by mouth daily.  . furosemide (LASIX) 40 MG  tablet Take 1 tablet (40 mg total) on M,W,F, then take 0.5 tablet (20 mg total) on other days.  Marland Kitchen gabapentin (NEURONTIN) 100 MG capsule Take 2 capsules (200 mg total) by mouth 3 (three) times daily.  . metFORMIN (GLUCOPHAGE) 500 MG tablet Take 1 tablet (500 mg total) by mouth 2 (two) times daily with a meal.  . potassium chloride (K-DUR) 10 MEQ tablet Take 2 tablets (20 mEq total) by mouth daily. Take 2 tablets (20 meq) by mouth once daily.  . sacubitril-valsartan (ENTRESTO) 24-26 MG Take 1 tablet by mouth 2 (two) times daily.  . tamsulosin (FLOMAX) 0.4 MG CAPS capsule Take 1 capsule (0.4 mg total) by mouth daily after supper.    Allergies:   Shellfish allergy   Social History   Socioeconomic History  . Marital status: Married    Spouse name: Not on file  . Number of children: Not on file  . Years of education: Not on file  . Highest education level: Not on file  Occupational History  . Not on file  Social Needs  . Financial resource strain: Not on file  . Food insecurity    Worry: Not on file    Inability: Not on file  . Transportation needs    Medical: Not on file    Non-medical: Not on file  Tobacco Use  . Smoking status: Never Smoker  . Smokeless tobacco: Never Used  Substance and Sexual Activity  . Alcohol use: No  . Drug use: No  . Sexual activity: Yes  Lifestyle  . Physical activity    Days per week: Not on file    Minutes per session: Not on file  . Stress: Not on file  Relationships  . Social Herbalist on phone: Not on file    Gets together: Not on file    Attends religious service: Not on file    Active member of club or organization: Not on file    Attends meetings of clubs or organizations: Not on file    Relationship status: Not on file  Other Topics Concern  . Not on file  Social History Narrative  . Not on file     Family History:  The patient's family history includes Cancer in his mother.  ROS:   Review of Systems  Constitutional:  Negative for chills, diaphoresis, fever, malaise/fatigue and weight loss.  HENT: Negative for congestion.   Eyes: Negative for discharge and redness.  Respiratory: Negative for cough, hemoptysis, sputum production, shortness of breath and wheezing.   Cardiovascular: Negative for chest pain, palpitations, orthopnea, claudication, leg swelling and  PND.  Gastrointestinal: Negative for abdominal pain, blood in stool, heartburn, melena, nausea and vomiting.  Genitourinary: Negative for hematuria.  Musculoskeletal: Negative for falls and myalgias.  Skin: Negative for rash.  Neurological: Negative for dizziness, tingling, tremors, sensory change, speech change, focal weakness, loss of consciousness and weakness.  Endo/Heme/Allergies: Does not bruise/bleed easily.  Psychiatric/Behavioral: Negative for substance abuse. The patient is not nervous/anxious.   All other systems reviewed and are negative.    EKGs/Labs/Other Studies Reviewed:    Studies reviewed were summarized above. The additional studies were reviewed today: As above.   EKG:  EKG is ordered today.  The EKG ordered today demonstrates NSR, 66 bpm, first-degree AV block, rare PVC, nonspecific inferolateral T wave inversion, largely unchanged from prior  Recent Labs: 08/11/2018: ALT 24; Hemoglobin 11.3; Platelets 131 11/25/2018: BUN 16; Creatinine, Ser 1.27; Potassium 4.0; Sodium 144  Recent Lipid Panel    Component Value Date/Time   CHOL 110 12/30/2017 1058   CHOL 143 08/02/2016 1110   TRIG 35 12/30/2017 1058   HDL 50 12/30/2017 1058   HDL 69 08/02/2016 1110   CHOLHDL 2.2 12/30/2017 1058   VLDL 7 12/30/2017 1058   LDLCALC 53 12/30/2017 1058   LDLCALC 64 08/02/2016 1110    PHYSICAL EXAM:    VS:  BP 110/60 (BP Location: Left Arm, Patient Position: Sitting, Cuff Size: Normal)   Pulse 66   Ht 5\' 9"  (1.753 m)   Wt 176 lb 8 oz (80.1 kg)   SpO2 99%   BMI 26.06 kg/m   BMI: Body mass index is 26.06 kg/m.  Physical Exam   Constitutional: He is oriented to person, place, and time. He appears well-developed and well-nourished.  HENT:  Head: Normocephalic and atraumatic.  Eyes: Right eye exhibits no discharge. Left eye exhibits no discharge.  Neck: Normal range of motion. No JVD present.  Cardiovascular: Normal rate, regular rhythm, S1 normal, S2 normal and normal heart sounds.  Extrasystoles are present. Exam reveals no distant heart sounds, no friction rub, no midsystolic click and no opening snap.  No murmur heard. Pulses:      Posterior tibial pulses are 2+ on the right side and 2+ on the left side.  Pulmonary/Chest: Effort normal and breath sounds normal. No respiratory distress. He has no decreased breath sounds. He has no wheezes. He has no rales. He exhibits no tenderness.  Abdominal: Soft. He exhibits no distension. There is no abdominal tenderness.  Musculoskeletal:        General: No edema.  Neurological: He is alert and oriented to person, place, and time.  Skin: Skin is warm and dry. No cyanosis. Nails show no clubbing.  Psychiatric: He has a normal mood and affect. His speech is normal and behavior is normal. Judgment and thought content normal.  Vitals reviewed.   Wt Readings from Last 3 Encounters:  12/09/18 176 lb 8 oz (80.1 kg)  11/25/18 176 lb 8 oz (80.1 kg)  11/10/18 169 lb (76.7 kg)     ASSESSMENT & PLAN:   1. Chronic combined systolic and diastolic CHF secondary to mixed ICM and NICM/BiV failure/pulmonary hypertension: He appears euvolemic and well compensated.  Continue current dose Entresto and Lasix.  No longer on beta-blocker secondary to bradycardic heart rates and prior first-degree AV block.  Check BMP today and if able would add spironolactone.  Our efforts to optimize evidence-based heart failure therapy have been delayed given our inability to contact the patient over the phone after labs as well  as with noncompliance on follow-up blood work.  Check limited echo to evaluate LV  systolic function following outpatient diuresis and further optimization of evidence-based heart failure therapy.  If his EF remains less than prior baseline of 45% we will need to pursue ischemic evaluation.  CHF education discussed in detail.  2. CAD involving the native coronary arteries without angina: He denies any symptoms concerning for angina.  Continue secondary prevention with aspirin and Lipitor.  No longer on beta-blocker as outlined above.  Following updated echo as above, we may need to revisit ischemic evaluation.  3. Moderate to severe mitral regurgitation: Check echo as above.  May need further evaluation with TEE pending the above work-up.  Cannot exclude this playing a role in his cardiomyopathy.  4. PVCs: Occasional PVCs noted on twelve-lead EKG with extrasystoles noted during cardiac auscultation today.  Placed 3-day ZIO monitor to quantify PVC burden.  No longer on a beta-blocker secondary to prior bradycardic heart rates and development of first-degree AV block.  Check BMP and magnesium.  Cannot exclude ventricular ectopy burden playing a role in his cardiomyopathy.  5. Pericardial effusion: No evidence of tamponade.  Vital signs stable.  Check echo as above.  6. Hypertensive heart disease: Blood pressure is well controlled.  Continue current medications as outlined above.  7. Hyperlipidemia: LDL of 53 from 12/2017 with normal liver function in 09/2018.  Remains on atorvastatin.  In follow-up recommend obtaining fasting lipid panel.  Disposition: F/u with Dr. Fletcher Anon in 1 month.   Medication Adjustments/Labs and Tests Ordered: Current medicines are reviewed at length with the patient today.  Concerns regarding medicines are outlined above. Medication changes, Labs and Tests ordered today are summarized above and listed in the Patient Instructions accessible in Encounters.   Signed, Christell Faith, PA-C 12/09/2018 1:27 PM     [address]  [address] [place], [place] [address] [phone]

## 2018-12-09 ENCOUNTER — Encounter: Payer: Self-pay | Admitting: Physician Assistant

## 2018-12-09 ENCOUNTER — Other Ambulatory Visit: Payer: Self-pay

## 2018-12-09 ENCOUNTER — Ambulatory Visit (INDEPENDENT_AMBULATORY_CARE_PROVIDER_SITE_OTHER): Payer: PPO | Admitting: Physician Assistant

## 2018-12-09 ENCOUNTER — Ambulatory Visit (INDEPENDENT_AMBULATORY_CARE_PROVIDER_SITE_OTHER): Payer: PPO

## 2018-12-09 VITALS — BP 110/60 | HR 66 | Ht 69.0 in | Wt 176.5 lb

## 2018-12-09 DIAGNOSIS — I493 Ventricular premature depolarization: Secondary | ICD-10-CM | POA: Diagnosis not present

## 2018-12-09 DIAGNOSIS — I5022 Chronic systolic (congestive) heart failure: Secondary | ICD-10-CM | POA: Diagnosis not present

## 2018-12-09 DIAGNOSIS — I255 Ischemic cardiomyopathy: Secondary | ICD-10-CM | POA: Diagnosis not present

## 2018-12-09 DIAGNOSIS — I428 Other cardiomyopathies: Secondary | ICD-10-CM | POA: Diagnosis not present

## 2018-12-09 DIAGNOSIS — E785 Hyperlipidemia, unspecified: Secondary | ICD-10-CM

## 2018-12-09 DIAGNOSIS — I340 Nonrheumatic mitral (valve) insufficiency: Secondary | ICD-10-CM | POA: Diagnosis not present

## 2018-12-09 DIAGNOSIS — I272 Pulmonary hypertension, unspecified: Secondary | ICD-10-CM | POA: Diagnosis not present

## 2018-12-09 DIAGNOSIS — I2510 Atherosclerotic heart disease of native coronary artery without angina pectoris: Secondary | ICD-10-CM | POA: Diagnosis not present

## 2018-12-09 DIAGNOSIS — I5082 Biventricular heart failure: Secondary | ICD-10-CM

## 2018-12-09 DIAGNOSIS — I130 Hypertensive heart and chronic kidney disease with heart failure and stage 1 through stage 4 chronic kidney disease, or unspecified chronic kidney disease: Secondary | ICD-10-CM | POA: Diagnosis not present

## 2018-12-09 NOTE — Patient Instructions (Signed)
Medication Instructions:  1- Your physician recommends that you continue on your current medications as directed. Please refer to the Current Medication list given to you today.  *If you need a refill on your cardiac medications before your next appointment, please call your pharmacy*  Lab Work: Your physician recommends that you have lab work today(BMET, Mag)  If you have labs (blood work) drawn today and your tests are completely normal, you will receive your results only by: Marland Kitchen MyChart Message (if you have MyChart) OR . A paper copy in the mail If you have any lab test that is abnormal or we need to change your treatment, we will call you to review the results.  Testing/Procedures: 1- A zio monitor was placed today. It will remain on for 3 days. You will then return monitor and event diary in provided box. It takes 1-2 weeks for report to be downloaded and returned to [place]. We will call you with the results. If monitor falls of or has orange flashing light, please call Zio for further instructions.   2- Echo  Please return to [facility] on ______________ at _______________ AM/PM for an LIMITED Echocardiogram. Your physician has requested that you have an echocardiogram. Echocardiography is a painless test that uses sound waves to create images of your heart. It provides your doctor with information about the size and shape of your heart and how well your heart's chambers and valves are working. This procedure takes approximately one hour. There are no restrictions for this procedure. Please note; depending on visual quality an IV may need to be placed.    Follow-Up: At [name], you and your health needs are our priority.  As part of our continuing mission to provide you with exceptional heart care, we have created designated Provider Care Teams.  These Care Teams include your primary Cardiologist (physician) and Advanced Practice Providers (APPs -  Physician Assistants and  Nurse Practitioners) who all work together to provide you with the care you need, when you need it.  Your next appointment:   1 month  The format for your next appointment:   In Person  Provider:    You may see Kathlyn Sacramento, MD or Christell Faith, PA-C.

## 2018-12-10 ENCOUNTER — Telehealth: Payer: Self-pay

## 2018-12-10 LAB — BASIC METABOLIC PANEL
BUN/Creatinine Ratio: 16 (ref 10–24)
BUN: 21 mg/dL (ref 8–27)
CO2: 24 mmol/L (ref 20–29)
Calcium: 9.4 mg/dL (ref 8.6–10.2)
Chloride: 108 mmol/L — ABNORMAL HIGH (ref 96–106)
Creatinine, Ser: 1.32 mg/dL — ABNORMAL HIGH (ref 0.76–1.27)
GFR calc Af Amer: 62 mL/min/{1.73_m2} (ref >59)
GFR calc non Af Amer: 54 mL/min/{1.73_m2} — ABNORMAL LOW (ref >59)
Glucose: 129 mg/dL — ABNORMAL HIGH (ref 65–99)
Potassium: 4.3 mmol/L (ref 3.5–5.2)
Sodium: 143 mmol/L (ref 134–144)

## 2018-12-10 LAB — MAGNESIUM: Magnesium: 1.8 mg/dL (ref 1.6–2.3)

## 2018-12-10 NOTE — Telephone Encounter (Signed)
Attempted to call patient. LMTCB 12/10/2018

## 2018-12-10 NOTE — Telephone Encounter (Signed)
-----   Message from Rise Mu, PA-C sent at 12/10/2018  9:44 AM EST ----- Renal function is relatively stable.  Given compliance issues and mild renal dysfunction, will hold off on adding spironolactone at this time.  Potassium is at goal.  Keep scheduled echo. Based on those results, we may need to pursue cath and EP referral.

## 2018-12-11 ENCOUNTER — Ambulatory Visit (INDEPENDENT_AMBULATORY_CARE_PROVIDER_SITE_OTHER): Payer: PPO

## 2018-12-11 DIAGNOSIS — I5022 Chronic systolic (congestive) heart failure: Secondary | ICD-10-CM

## 2018-12-11 NOTE — Telephone Encounter (Signed)
Attempted to call the patient. No answer- I left a message to call back at his home # & cell #.

## 2018-12-16 ENCOUNTER — Telehealth: Payer: Self-pay | Admitting: *Deleted

## 2018-12-16 DIAGNOSIS — Z79899 Other long term (current) drug therapy: Secondary | ICD-10-CM

## 2018-12-16 DIAGNOSIS — I5022 Chronic systolic (congestive) heart failure: Secondary | ICD-10-CM

## 2018-12-16 MED ORDER — SPIRONOLACTONE 25 MG PO TABS: 12.5000 mg | ORAL_TABLET | Freq: Every day | ORAL | 2 refills | Status: DC

## 2018-12-16 NOTE — Telephone Encounter (Signed)
-----   Message from Rise Mu, PA-C sent at 12/14/2018  8:05 AM EST ----- Pump function has improved some, up to 30-35%, prior 25-30%, though still not back to the 40-45% he had previously. Mild thickening of the heart is noted. Slightly stiffened heart. Dilated left ventricle. Normal pump function of the right side of the heart. Mildly leaky mitral valve. Prior pericardial effusion is not noted on this study.   He has not been on a beta blocker in the setting of prior bradycardia and first degree AV block. We have had some difficulty in escalating Entresto or addition of spironolactone secondary to relative hypotension and issues recommended with follow up labs.   Recommendations: Please have him STOP KCl.  Start spironolactone 12.5 mg daily.  Recheck BMET 1 week after starting spironolactone.  Keep appointment on 01/13/2019.

## 2018-12-16 NOTE — Telephone Encounter (Signed)
Results called to pt. Pt verbalized understanding of results and plan of care. He verbalized understanding to stop potassium, start spironolactone and go to the [facility] for lab on 11/17 for BMET. Rx sent to pharmacy.

## 2018-12-23 ENCOUNTER — Other Ambulatory Visit: Payer: Self-pay

## 2018-12-23 ENCOUNTER — Telehealth: Payer: Self-pay

## 2018-12-23 ENCOUNTER — Other Ambulatory Visit
Admission: RE | Admit: 2018-12-23 | Discharge: 2018-12-23 | Disposition: A | Discharge status: Home / Self Care | Payer: PPO | Source: Ambulatory Visit | Attending: Physician Assistant | Admitting: Physician Assistant

## 2018-12-23 DIAGNOSIS — I5022 Chronic systolic (congestive) heart failure: Secondary | ICD-10-CM | POA: Diagnosis not present

## 2018-12-23 DIAGNOSIS — Z79899 Other long term (current) drug therapy: Secondary | ICD-10-CM | POA: Diagnosis not present

## 2018-12-23 LAB — BASIC METABOLIC PANEL
Anion gap: 10 (ref 5–15)
BUN: 20 mg/dL (ref 8–23)
CO2: 26 mmol/L (ref 22–32)
Calcium: 9.3 mg/dL (ref 8.9–10.3)
Chloride: 107 mmol/L (ref 98–111)
Creatinine, Ser: 1.39 mg/dL — ABNORMAL HIGH (ref 0.61–1.24)
GFR calc Af Amer: 58 mL/min — ABNORMAL LOW (ref >60)
GFR calc non Af Amer: 50 mL/min — ABNORMAL LOW (ref >60)
Glucose, Bld: 159 mg/dL — ABNORMAL HIGH (ref 70–99)
Potassium: 3.9 mmol/L (ref 3.5–5.1)
Sodium: 143 mmol/L (ref 135–145)

## 2018-12-23 NOTE — Telephone Encounter (Signed)
-----   Message from Rise Mu, PA-C sent at 12/23/2018 12:04 PM EST ----- Renal function and potassium are relatively stable following addition of spironolactone.  No changes in medications.  Please follow-up as planned on 12/8.

## 2018-12-23 NOTE — Telephone Encounter (Signed)
Detailed message left on machine per DPR okay. No changes at this time.   Advised pt to call for any further questions or concerns.

## 2018-12-24 DIAGNOSIS — I493 Ventricular premature depolarization: Secondary | ICD-10-CM | POA: Diagnosis not present

## 2018-12-29 ENCOUNTER — Inpatient Hospital Stay: Payer: PPO | Attending: Oncology

## 2018-12-29 ENCOUNTER — Telehealth: Payer: Self-pay

## 2018-12-29 ENCOUNTER — Other Ambulatory Visit: Payer: Self-pay

## 2018-12-29 DIAGNOSIS — E538 Deficiency of other specified B group vitamins: Secondary | ICD-10-CM | POA: Diagnosis not present

## 2018-12-29 MED ORDER — CYANOCOBALAMIN 1000 MCG/ML IJ SOLN
1000.0000 ug | INTRAMUSCULAR | Status: DC
  Administered 2018-12-29: 1000 ug via INTRAMUSCULAR
  Filled 2018-12-29: qty 1

## 2018-12-29 NOTE — Telephone Encounter (Signed)
Attempted to call patient. [name] 12/29/2018

## 2018-12-29 NOTE — Telephone Encounter (Signed)
-----   Message from Rise Mu, PA-C sent at 12/29/2018  7:15 AM EST ----- Cardiac monitor showed a predominant rhythm of sinus with an average heart rate of 66 bpm with frequent PACs and PVCs with an approximate 6% burden for each.  2 episodes of NSVT lasting 4 beats with a maximal rate of 158 bpm.  11 atrial runs occurred lasting up to 12 beats.  No sustained arrhythmias or prolonged pauses.  Keep scheduled follow-up.

## 2019-01-05 NOTE — Telephone Encounter (Signed)
Attempted to call patient. LMTCB 01/05/2019. Left detailed message per DPR.   Pt has follow up 12/8 with Dr. Fletcher Anon.   No further orders at this time.   Advised pt to call for any further questions or concerns.

## 2019-01-13 ENCOUNTER — Encounter: Payer: Self-pay | Admitting: Cardiovascular Disease

## 2019-01-13 ENCOUNTER — Ambulatory Visit (INDEPENDENT_AMBULATORY_CARE_PROVIDER_SITE_OTHER): Payer: PPO | Admitting: Cardiovascular Disease

## 2019-01-13 ENCOUNTER — Other Ambulatory Visit: Payer: Self-pay

## 2019-01-13 VITALS — BP 132/66 | HR 67 | Ht 69.0 in | Wt 181.5 lb

## 2019-01-13 DIAGNOSIS — I10 Essential (primary) hypertension: Secondary | ICD-10-CM

## 2019-01-13 DIAGNOSIS — I2510 Atherosclerotic heart disease of native coronary artery without angina pectoris: Secondary | ICD-10-CM

## 2019-01-13 DIAGNOSIS — I493 Ventricular premature depolarization: Secondary | ICD-10-CM

## 2019-01-13 DIAGNOSIS — I5022 Chronic systolic (congestive) heart failure: Secondary | ICD-10-CM

## 2019-01-13 MED ORDER — CARVEDILOL 3.125 MG PO TABS: 3.1250 mg | ORAL_TABLET | Freq: Two times a day (BID) | ORAL | 3 refills | Status: DC

## 2019-01-13 NOTE — Patient Instructions (Signed)
Medication Instructions:  Your physician has recommended you make the following change in your medication:   START Carvedilol 3.125mg  twice daily. An Rx has been sent to your pharmacy.  *If you need a refill on your cardiac medications before your next appointment, please call your pharmacy*  Lab Work: None ordered If you have labs (blood work) drawn today and your tests are completely normal, you will receive your results only by: Marland Kitchen MyChart Message (if you have MyChart) OR . A paper copy in the mail If you have any lab test that is abnormal or we need to change your treatment, we will call you to review the results.  Testing/Procedures: None ordered  Follow-Up: At [facility], you and your health needs are our priority.  As part of our continuing mission to provide you with exceptional heart care, we have created designated Provider Care Teams.  These Care Teams include your primary Cardiologist (physician) and Advanced Practice Providers (APPs -  Physician Assistants and Nurse Practitioners) who all work together to provide you with the care you need, when you need it.  Your next appointment:   3 month(s)  The format for your next appointment:   In Person  Provider:    You may see Kathlyn Sacramento, MD or one of the following Advanced Practice Providers on your designated Care Team:    Murray Hodgkins, NP  Christell Faith, PA-C  Marrianne Mood, PA-C   Other Instructions N/A

## 2019-01-13 NOTE — Progress Notes (Signed)
Cardiology Office Note   Date:  01/13/2019   ID:  Christopher Mota., DOB 11-26-1945, MRN [id number]  PCP:  Leone Haven, MD  Cardiologist:   Kathlyn Sacramento, MD   Chief Complaint  Patient presents with  . other    1 month follow up. Meds reviewed verbally with patient.       History of Present Illness: Christopher Kulaga. is a 73 y.o. male who presents for a follow-up visit regarding chronic systolic heart failure and coronary artery disease. He has known history of colon cancer status post colectomy in 2015 followed by chemotherapy with oxaliplatin and fluorouracil. He had neuropathy as a result.  He is not a smoker and has not had any alcohol in 10 years. He is not diabetic.   He was diagnosed with acute systolic heart failure in January 2017. Echocardiogram showed severely reduced LV systolic function with an ejection fraction of 20-25%, moderate mitral regurgitation, moderate tricuspid regurgitation with moderate to severe pulmonary hypertension. Right and left cardiac catheterization showed moderate to severe pulmonary hypertension with severely elevated filling pressures with severely reduced cardiac output. Coronary angiography showed severe one-vessel coronary artery disease with 80-90% stenosis in the proximal left circumflex with mild diffuse disease affecting the LAD and RCA. He was started on heart failure medications and subsequently underwent staged PCI of the left circumflex in May 2017.  Carvedilol was discontinued in October due to first-degree AV block and sinus bradycardia with a heart rate of 54 bpm.  He was transitioned from losartan to [employer].  Small dose spironolactone was added and he has been tolerating these medications. He underwent a repeat echocardiogram last month which showed an EF of 30 to 35%.  He was noted to have PVCs and thus he had a 4-day outpatient monitor which showed sinus rhythm with frequent PACs and frequent PVCs with a burden of 6%. The  patient is feeling reasonably well and denies any chest pain, shortness of breath or palpitations.   Past Medical History:  Diagnosis Date  . Acute renal failure (ARF) ([place]) 08/14/2017  . Anemia   . CAD (coronary artery disease)    a. 02/2015 Cath: LM nl, LAD 50/40 mid/distal, LCX 80p, RCA 40p, 30d, RPL1 40, CO 3.27, CI 1.65; b. cath 06/09/15: LM minor irregs, m-dLAD 50%, dLAD 40%, pLCx 80% s/p PCI/DES 0%, pRCA 40%, dRCA 30%, 1st RPLB 40%  . Chronic systolic CHF (congestive heart failure) ([place])    a. 02/2015 Echo: EF 20-25%, diff HK, mod MR, mildy dil LA, mildly dil RV w/ mod RV syst dysfxn, mildly dil RA, mod TR, PASP 68mmHg; b. 09/2017 Echo: EF 40-45%, diff HK. Gr1 DD. Mild to mod MR. Mildly to mod dil LA.  . Colon cancer ([place])    a. 2015 s/p colectomy followed by chemoRx with oxaliplatin & fluorouacil.  . Dia hypertension 07/20/2014  . Diabetes mellitus without complication ([place])   . Hypertension   . Hypertensive heart disease   . Mixed Ischemic and Non-ischemic Cardiomyopathy    a. 02/2015 Echo: EF 20-25%, diff HK; b. 09/2016 Echo: EF 40-45%.  . Moderate mitral regurgitation    a. 09/2017 Echo: Mild to mod TR.  . Tubular adenoma of colon     Past Surgical History:  Procedure Laterality Date  . CARDIAC CATHETERIZATION Bilateral 02/24/2015   Procedure: Right/Left Heart Cath and Coronary Angiography;  Surgeon: Wellington Hampshire, MD;  Location: [facility];  Service: Cardiovascular;  Laterality: Bilateral;  .  CARDIAC CATHETERIZATION N/A 06/09/2015   Procedure: Coronary Stent Intervention;  Surgeon: Wellington Hampshire, MD;  Location: [facility];  Service: Cardiovascular;  Laterality: N/A;  . COLON SURGERY     Colectomy  . COLONOSCOPY    . COLONOSCOPY WITH PROPOFOL N/A 12/11/2016   Procedure: COLONOSCOPY WITH PROPOFOL;  Surgeon: Lollie Sails, MD;  Location: [facility];  Service: Endoscopy;  Laterality: N/A;  . ESOPHAGOGASTRODUODENOSCOPY    . PORT-A-CATH REMOVAL N/A  07/12/2014   Procedure: REMOVAL PORT-A-CATH;  Surgeon: Molly Maduro, MD;  Location: [facility];  Service: General;  Laterality: N/A;  . PORTACATH PLACEMENT Left      Current Outpatient Medications  Medication Sig Dispense Refill  . aspirin EC 81 MG tablet Take 1 tablet (81 mg total) by mouth daily. 90 tablet 3  . atorvastatin (LIPITOR) 40 MG tablet Take 1 tablet (40 mg total) by mouth daily. 90 tablet 0  . furosemide (LASIX) 40 MG tablet Take 1 tablet (40 mg total) on M,W,F, then take 0.5 tablet (20 mg total) on other days. 90 tablet 3  . gabapentin (NEURONTIN) 100 MG capsule Take 2 capsules (200 mg total) by mouth 3 (three) times daily. 180 capsule 0  . metFORMIN (GLUCOPHAGE) 500 MG tablet Take 1 tablet (500 mg total) by mouth 2 (two) times daily with a meal. 180 tablet 3  . sacubitril-valsartan (ENTRESTO) 24-26 MG Take 1 tablet by mouth 2 (two) times daily. 60 tablet 6  . spironolactone (ALDACTONE) 25 MG tablet Take 0.5 tablets (12.5 mg total) by mouth daily. 45 tablet 2  . tamsulosin (FLOMAX) 0.4 MG CAPS capsule Take 1 capsule (0.4 mg total) by mouth daily after supper. 30 capsule 11   No current facility-administered medications for this visit.     Allergies:   Shellfish allergy    Social History:  The patient  reports that he has never smoked. He has never used smokeless tobacco. He reports that he does not drink alcohol or use drugs.   Family History:  The patient's family history includes Cancer in his mother.    ROS:  Please see the history of present illness.   Otherwise, review of systems are positive for none.   All other systems are reviewed and negative.    PHYSICAL EXAM: VS:  BP 132/66 (BP Location: Left Arm, Patient Position: Sitting, Cuff Size: Normal)   Pulse 67   Ht 5\' 9"  (1.753 m)   Wt 181 lb 8 oz (82.3 kg)   SpO2 99%   BMI 26.80 kg/m  , BMI Body mass index is 26.8 kg/m. GEN: Well nourished, well developed, in no acute distress  HEENT: normal  Neck: no  JVD, carotid bruits, or masses Cardiac: RRR; no murmurs, rubs, or gallops,no edema  Respiratory:  clear to auscultation bilaterally, normal work of breathing GI: soft, nontender, nondistended, + BS MS: no deformity or atrophy  Skin: warm and dry, no rash Neuro:  Strength and sensation are intact Psych: euthymic mood, full affect   EKG:  EKG is ordered today.   EKG shows sinus rhythm with PVCs and nonspecific T wave changes.   Recent Labs: 08/11/2018: ALT 24; Hemoglobin 11.3; Platelets 131 12/09/2018: Magnesium 1.8 12/23/2018: BUN 20; Creatinine, Ser 1.39; Potassium 3.9; Sodium 143    Lipid Panel    Component Value Date/Time   CHOL 110 12/30/2017 1058   CHOL 143 08/02/2016 1110   TRIG 35 12/30/2017 1058   HDL 50 12/30/2017 1058   HDL 69 08/02/2016  1110   CHOLHDL 2.2 12/30/2017 1058   VLDL 7 12/30/2017 1058   LDLCALC 53 12/30/2017 1058   LDLCALC 64 08/02/2016 1110      Wt Readings from Last 3 Encounters:  01/13/19 181 lb 8 oz (82.3 kg)  12/09/18 176 lb 8 oz (80.1 kg)  11/25/18 176 lb 8 oz (80.1 kg)        ASSESSMENT AND PLAN:  1.  Coronary artery disease involving native coronary arteries without angina:  He is status post PCI and drug-eluting stent placement to the left circumflex  : He is doing well overall.  Continue medical therapy.  2. Chronic systolic heart failure:  He appears to be euvolemic . He is currently New York Heart Association class II.  Most recent ejection fraction was 30 to 35% and since then spironolactone was added to [facility].  I elected to resume carvedilol 3.125 mg twice daily.  3. Essential hypertension: Blood pressure is reasonably controlled.  4. Hyperlipidemia: Continue treatment with atorvastatin.  Most recent LDL was 53.  5.  Frequent PVCs: This might contribute to his continued cardiomyopathy.  It appears that he was not having much PVCs when he was on small dose carvedilol.  Carvedilol was discontinued due to first-degree AV block and  sinus bradycardia but his heart rate was still above 50.  Thus, I think there is more potential benefit from carvedilol than rest and I resumed the dose at 3.125 mg twice daily.  I do not think he will be able to tolerate higher doses.  If PVCs persist with significant burden, EP evaluation might be needed.   Disposition:   FU with me in 3 months  Signed,  Kathlyn Sacramento, MD  01/13/2019 3:41 PM    [place]

## 2019-02-02 ENCOUNTER — Inpatient Hospital Stay: Payer: PPO | Attending: Oncology

## 2019-02-02 ENCOUNTER — Other Ambulatory Visit: Payer: Self-pay

## 2019-02-02 DIAGNOSIS — E538 Deficiency of other specified B group vitamins: Secondary | ICD-10-CM | POA: Diagnosis not present

## 2019-02-02 MED ORDER — CYANOCOBALAMIN 1000 MCG/ML IJ SOLN
1000.0000 ug | INTRAMUSCULAR | Status: DC
  Administered 2019-02-02: 1000 ug via INTRAMUSCULAR
  Filled 2019-02-02: qty 1

## 2019-02-16 ENCOUNTER — Other Ambulatory Visit: Payer: Self-pay

## 2019-02-16 NOTE — Progress Notes (Signed)
Patient pre screened for office appointment, no questions or concerns today. Patient reminded of upcoming appointment time and date. 

## 2019-02-17 ENCOUNTER — Other Ambulatory Visit: Payer: Self-pay

## 2019-02-17 ENCOUNTER — Inpatient Hospital Stay: Payer: PPO

## 2019-02-17 ENCOUNTER — Encounter: Payer: Self-pay | Admitting: Oncology

## 2019-02-17 ENCOUNTER — Inpatient Hospital Stay: Payer: PPO | Attending: Oncology | Admitting: Oncology

## 2019-02-17 VITALS — BP 136/73 | HR 65 | Temp 97.8°F | Resp 18 | Wt 183.6 lb

## 2019-02-17 DIAGNOSIS — D61818 Other pancytopenia: Secondary | ICD-10-CM | POA: Diagnosis not present

## 2019-02-17 DIAGNOSIS — Z9221 Personal history of antineoplastic chemotherapy: Secondary | ICD-10-CM

## 2019-02-17 DIAGNOSIS — Z7984 Long term (current) use of oral hypoglycemic drugs: Secondary | ICD-10-CM | POA: Diagnosis not present

## 2019-02-17 DIAGNOSIS — E538 Deficiency of other specified B group vitamins: Secondary | ICD-10-CM | POA: Insufficient documentation

## 2019-02-17 DIAGNOSIS — I10 Essential (primary) hypertension: Secondary | ICD-10-CM | POA: Insufficient documentation

## 2019-02-17 DIAGNOSIS — Z79899 Other long term (current) drug therapy: Secondary | ICD-10-CM | POA: Diagnosis not present

## 2019-02-17 DIAGNOSIS — Z9049 Acquired absence of other specified parts of digestive tract: Secondary | ICD-10-CM | POA: Diagnosis not present

## 2019-02-17 DIAGNOSIS — Z85038 Personal history of other malignant neoplasm of large intestine: Secondary | ICD-10-CM | POA: Insufficient documentation

## 2019-02-17 DIAGNOSIS — D696 Thrombocytopenia, unspecified: Secondary | ICD-10-CM

## 2019-02-17 DIAGNOSIS — C187 Malignant neoplasm of sigmoid colon: Secondary | ICD-10-CM

## 2019-02-17 DIAGNOSIS — Z7982 Long term (current) use of aspirin: Secondary | ICD-10-CM | POA: Diagnosis not present

## 2019-02-17 DIAGNOSIS — E119 Type 2 diabetes mellitus without complications: Secondary | ICD-10-CM | POA: Insufficient documentation

## 2019-02-17 LAB — COMPREHENSIVE METABOLIC PANEL
ALT: 11 U/L (ref 0–44)
AST: 15 U/L (ref 15–41)
Albumin: 3.7 g/dL (ref 3.5–5.0)
Alkaline Phosphatase: 66 U/L (ref 38–126)
Anion gap: 7 (ref 5–15)
BUN: 21 mg/dL (ref 8–23)
CO2: 29 mmol/L (ref 22–32)
Calcium: 9.1 mg/dL (ref 8.9–10.3)
Chloride: 107 mmol/L (ref 98–111)
Creatinine, Ser: 1.28 mg/dL — ABNORMAL HIGH (ref 0.61–1.24)
GFR calc Af Amer: >60 mL/min (ref >60)
GFR calc non Af Amer: 56 mL/min — ABNORMAL LOW (ref >60)
Glucose, Bld: 122 mg/dL — ABNORMAL HIGH (ref 70–99)
Potassium: 4 mmol/L (ref 3.5–5.1)
Sodium: 143 mmol/L (ref 135–145)
Total Bilirubin: 0.9 mg/dL (ref 0.3–1.2)
Total Protein: 7 g/dL (ref 6.5–8.1)

## 2019-02-17 LAB — CBC WITH DIFFERENTIAL/PLATELET
Abs Immature Granulocytes: 0.01 10*3/uL (ref 0.00–0.07)
Basophils Absolute: 0 10*3/uL (ref 0.0–0.1)
Basophils Relative: 0 %
Eosinophils Absolute: 0.1 10*3/uL (ref 0.0–0.5)
Eosinophils Relative: 4 %
HCT: 34.7 % — ABNORMAL LOW (ref 39.0–52.0)
Hemoglobin: 11.6 g/dL — ABNORMAL LOW (ref 13.0–17.0)
Immature Granulocytes: 0 %
Lymphocytes Relative: 21 %
Lymphs Abs: 0.6 10*3/uL — ABNORMAL LOW (ref 0.7–4.0)
MCH: 32.9 pg (ref 26.0–34.0)
MCHC: 33.4 g/dL (ref 30.0–36.0)
MCV: 98.3 fL (ref 80.0–100.0)
Monocytes Absolute: 0.4 10*3/uL (ref 0.1–1.0)
Monocytes Relative: 13 %
Neutro Abs: 1.8 10*3/uL (ref 1.7–7.7)
Neutrophils Relative %: 62 %
Platelets: 104 10*3/uL — ABNORMAL LOW (ref 150–400)
RBC: 3.53 MIL/uL — ABNORMAL LOW (ref 4.22–5.81)
RDW: 13.3 % (ref 11.5–15.5)
WBC: 3 10*3/uL — ABNORMAL LOW (ref 4.0–10.5)
nRBC: 0 % (ref 0.0–0.2)

## 2019-02-17 LAB — VITAMIN B12: Vitamin B-12: 357 pg/mL (ref 180–914)

## 2019-02-17 NOTE — Progress Notes (Signed)
Pre-assessment done yesterday. 

## 2019-02-17 NOTE — Progress Notes (Signed)
[place]  Chief Complaint: [name]. is a 74 y.o. male with a history of stage IIIC colon cancer, pancytopenia, and B12 deficiency who presented to follow-up for management of history of colon cancer and vitamin B12 deficiency.   PERTINENT ONCOLOGY HISTORY Patient previous oncology care was with Dr. Mike Gip who has moved practice to [facility]. Patient switches care to me on 02/10/2018  # stage IIIC colon cancer.  He presented with symptomatic anemia.  He underwent descending and proximal sigmoid colectomy, left ureteral lysis, and partial cecectomy on 06/23/2013. Six of 22 lymph nodes were positive.  There were tumor deposits (discontinuous extramural extension).  There was lymphovascular and perineural invasion. Pathologic stage was IIIC (T4bN2a M0). He received FOLFOX chemotherapy from 07/22/2013 until 01/06/2014.    Chest, abdomen, and pelvic CT on 01/12/2016 revealed no evidence of recurrent or metastatic carcinoma.  Abdomen and pelvic CT with and without contrast on 09/13/2017 revealed no multiple simple renal cysts.  Liver was unremarkable.  Colonoscopy on 04/12/2014 revealed several polyps.  There was no dysplasia or malignancy.  Colonoscopy on 12/11/2016 revealed 10 polyps (2-8 mm) in the ascending proximal transverse, descending, proximal sigmoid and rectum.  Pathology revealed multiple tubular adenomas without dysplasia or malignancy.  He was diagnosed with pyelonephritis.  He is undergoing evaluation for hematuria.  CT scan was negative.  Cystoscopy on 10/03/2017 revealed prostate enlargement and no evidence of bladder pathology.  # He has a mild pancytopenia.  He received a course of Septra (08/26/2017 - 09/02/2017).  He has a history of a mild normocytic anemia.  Diet is modest.  He denies herbal products.  He denies any melena or hematochezia.  Work-up on 09/27/2017 revealed a hematocrit 31.1, hemoglobin 10.6, MCV 94, platelets 141,000,  WBC 2700 with an ANC of 1600.  Creatinine was 1.32.  He has B12 deficiency.  B12 was low (149).  Anti-parietal cell antibody and intrinsic factor antibody were negative.   Negative studies included:  folate (12.3), TSH, hepatitis B core antibody total, and hepatitis C antibody. Retic was 1.8%.  Ferritin was 103 with a iron saturation of 25% and a TIBC of 201.   Creatinine was 1.32.  He has B12 deficiency.  B12 was 149 on 09/27/2017 then 322 on 11/04/2017 and 227 on 12/05/2017 on oral B12.  Echocardiogram revealed an EF was 20-25% on 02/18/2015.  Cardiac catheterization on 02/24/2015 revealed 80% occlusion of the proximal left circumflex.  He managed medically.   INTERVAL HISTORY [name]. is a 74 y.o. male who has above history reviewed by me today presents for follow up visit for management of history of colon cancer and vitamin B12 deficiency Problems and complaints are listed below: Patient has no new complaints today.   Denies any unintentional weight loss, abdominal pain, bloody or black tarry stool. History of vitamin B12 deficiency, patient has been on parenteral vitamin B12 injection monthly. Appetite is good.   Past Medical History:  Diagnosis Date  . Acute renal failure (ARF) ([place]) 08/14/2017  . Anemia   . CAD (coronary artery disease)    a. 02/2015 Cath: LM nl, LAD 50/40 mid/distal, LCX 80p, RCA 40p, 30d, RPL1 40, CO 3.27, CI 1.65; b. cath 06/09/15: LM minor irregs, m-dLAD 50%, dLAD 40%, pLCx 80% s/p PCI/DES 0%, pRCA 40%, dRCA 30%, 1st RPLB 40%  . Chronic systolic CHF (congestive heart failure) ([place])    a. 02/2015 Echo: EF 20-25%, diff HK, mod MR, mildy dil  LA, mildly dil RV w/ mod RV syst dysfxn, mildly dil RA, mod TR, PASP 83mHg; b. 09/2017 Echo: EF 40-45%, diff HK. Gr1 DD. Mild to mod MR. Mildly to mod dil LA.  . [place] cancer ([place]    a. 2015 s/p colectomy followed by chemoRx with oxaliplatin & fluorouacil.  . Dia hypertension 07/20/2014  . Diabetes mellitus without  complication [place]   . Hypertension   . Hypertensive heart disease   . Mixed Ischemic and Non-ischemic Cardiomyopathy    a. 02/2015 Echo: EF 20-25%, diff HK; b. 09/2016 Echo: EF 40-45%.  . Moderate mitral regurgitation    a. 09/2017 Echo: Mild to mod TR.  . Tubular adenoma of colon     Past Surgical History:  Procedure Laterality Date  . CARDIAC CATHETERIZATION Bilateral 02/24/2015   Procedure: Right/Left Heart Cath and Coronary Angiography;  Surgeon: MWellington Hampshire MD;  [facility];  Service: Cardiovascular;  Laterality: Bilateral;  . CARDIAC CATHETERIZATION N/A 06/09/2015   Procedure: Coronary Stent Intervention;  Surgeon: MWellington Hampshire MD;  [facility];  Service: Cardiovascular;  Laterality: N/A;  . COLON SURGERY     Colectomy  . COLONOSCOPY    . COLONOSCOPY WITH PROPOFOL N/A 12/11/2016   Procedure: COLONOSCOPY WITH PROPOFOL;  Surgeon: SLollie Sails MD;  [facility];  Service: Endoscopy;  Laterality: N/A;  . ESOPHAGOGASTRODUODENOSCOPY    . PORT-A-CATH REMOVAL N/A 07/12/2014   Procedure: REMOVAL PORT-A-CATH;  Surgeon: WMolly Maduro MD;  Location: [facility];  Service: General;  Laterality: N/A;  . PORTACATH PLACEMENT Left    Family History  Problem Relation Age of Onset  . Cancer Mother    Social History   Socioeconomic History  . Marital status: Married    Spouse name: Not on file  . Number of children: Not on file  . Years of education: Not on file  . Highest education level: Not on file  Occupational History  . Not on file  Tobacco Use  . Smoking status: Never Smoker  . Smokeless tobacco: Never Used  Substance and Sexual Activity  . Alcohol use: No  . Drug use: No  . Sexual activity: Yes  Other Topics Concern  . Not on file  Social History Narrative  . Not on file   Social Determinants of Health   Financial Resource Strain:   . Difficulty of Paying Living Expenses: Not on file  Food Insecurity:   .  Worried About RCharity fundraiserin the Last Year: Not on file  . Ran Out of Food in the Last Year: Not on file  Transportation Needs:   . Lack of Transportation (Medical): Not on file  . Lack of Transportation (Non-Medical): Not on file  Physical Activity:   . Days of Exercise per Week: Not on file  . Minutes of Exercise per Session: Not on file  Stress:   . Feeling of Stress : Not on file  Social Connections:   . Frequency of Communication with Friends and Family: Not on file  . Frequency of Social Gatherings with Friends and Family: Not on file  . Attends Religious Services: Not on file  . Active Member of Clubs or Organizations: Not on file  . Attends CArchivistMeetings: Not on file  . Marital Status: Not on file  Intimate Partner Violence:   . Fear of Current or Ex-Partner: Not on file  . Emotionally Abused: Not on file  . Physically Abused: Not  on file  . Sexually Abused: Not on file    Allergies:  Allergies  Allergen Reactions  . Shellfish Allergy Other (See Comments)    Pt. instructed by MD to avoid seafood    Current Medications: Current Outpatient Medications  Medication Sig Dispense Refill  . aspirin EC 81 MG tablet Take 1 tablet (81 mg total) by mouth daily. 90 tablet 3  . atorvastatin (LIPITOR) 40 MG tablet Take 1 tablet (40 mg total) by mouth daily. 90 tablet 0  . carvedilol (COREG) 3.125 MG tablet Take 1 tablet (3.125 mg total) by mouth 2 (two) times daily. 180 tablet 3  . furosemide (LASIX) 40 MG tablet Take 1 tablet (40 mg total) on M,W,F, then take 0.5 tablet (20 mg total) on other days. 90 tablet 3  . gabapentin (NEURONTIN) 100 MG capsule Take 2 capsules (200 mg total) by mouth 3 (three) times daily. 180 capsule 0  . metFORMIN (GLUCOPHAGE) 500 MG tablet Take 1 tablet (500 mg total) by mouth 2 (two) times daily with a meal. 180 tablet 3  . sacubitril-valsartan (ENTRESTO) 24-26 MG Take 1 tablet by mouth 2 (two) times daily. 60 tablet 6  .  spironolactone (ALDACTONE) 25 MG tablet Take 0.5 tablets (12.5 mg total) by mouth daily. 45 tablet 2  . tamsulosin (FLOMAX) 0.4 MG CAPS capsule Take 1 capsule (0.4 mg total) by mouth daily after supper. 30 capsule 11   No current facility-administered medications for this visit.    Review of Systems  Constitutional: Negative for chills, fever, malaise/fatigue and weight loss.  HENT: Negative for sore throat.   Eyes: Negative for redness.  Respiratory: Negative for cough, shortness of breath and wheezing.   Cardiovascular: Negative for chest pain, palpitations and leg swelling.  Gastrointestinal: Negative for abdominal pain, blood in stool, nausea and vomiting.  Genitourinary: Negative for dysuria.  Musculoskeletal: Negative for myalgias.  Skin: Negative for rash.       Left lower extremity skin wound, chronic  Neurological: Negative for dizziness, tingling and tremors.  Endo/Heme/Allergies: Does not bruise/bleed easily.  Psychiatric/Behavioral: Negative for hallucinations.   Performance status (ECOG): 0  Vital Signs: BP 136/73   Pulse 65   Temp 97.8 F (36.6 C)   Resp 18   Wt 183 lb 9.6 oz (83.3 kg)   BMI 27.11 kg/m   Physical Exam  Constitutional: He is oriented to person, place, and time. No distress.  HENT:  Head: Normocephalic and atraumatic.  Nose: Nose normal.  Mouth/Throat: Oropharynx is clear and moist. No oropharyngeal exudate.  Eyes: Pupils are equal, round, and reactive to light. EOM are normal. No scleral icterus.  Cardiovascular: Normal rate and regular rhythm.  No murmur heard. Pulmonary/Chest: Effort normal. No respiratory distress. He has no rales. He exhibits no tenderness.  Abdominal: Soft. He exhibits no distension. There is no abdominal tenderness.  Musculoskeletal:        General: No edema. Normal range of motion.     Cervical back: Normal range of motion and neck supple.     Comments: Chronic lower extremity edema +1  Neurological: He is alert and  oriented to person, place, and time. No cranial nerve deficit. He exhibits normal muscle tone. Coordination normal.  Skin: Skin is warm and dry. He is not diaphoretic. No erythema.  Chronic dermatological changes due to vein insufficiency  Psychiatric: Affect and judgment normal.   RADIOGRAPHIC STUDIES: I have personally reviewed the radiological images as listed and agreed with the findings in the report.  LONG TERM MONITOR (3-14 DAYS)  Result Date: 12/28/2018  The patient was monitored for 4 days, 1 hour.  The predominant rhythm was sinus with an average rate of 66 bpm (range 43 to 115 bpm in sinus).  Frequent PACs (~6%) and PVCs (~6%) occurred.  There were 2 episodes of nonsustained ventricular tachycardia lasting up to 4 beats with a maximal rate of 158 bpm.  11 atrial runs also occurred, lasting up to 12 beats with a maximal rate of 135 bpm.  There was no sustained arrhythmia or prolonged pause.  There were no patient triggered events.  Predominantly sinus rhythm with frequent PACs and PVCs.  Rare episodes of brief NSVT and PSVT noted.   ECHOCARDIOGRAM LIMITED  Result Date: 12/13/2018   ECHOCARDIOGRAM LIMITED REPORT   Patient Name:   [name]. Date of Exam: 12/11/2018 Medical Rec #:  [id number]         Height:       69.0 in Accession #:    [id number]        Weight:       176.5 lb Date of Birth:  27-Mar-1945        BSA:          1.96 m Patient Age:    73 years          BP:           116/58 mmHg Patient Gender: M                 HR:           64 bpm. Exam Location:  [place]  Procedure: Limited Echo and Limited Color Doppler Indications:    I50.20* Unspecified systolic (congestive) heart failure  History:        Patient has prior history of Echocardiogram examinations, most                 recent 09/30/2018. CHF and Cardiomyopathy, Pericardial Disease                 and CAD; Pulmonary HTN Signs/Symptoms:Hypertensive Heart Disease                 and Chest Pain Risk  Factors:Hypertension, Diabetes, Dyslipidemia                 and Non-Smoker.  Sonographer:    Pilar Jarvis RDMS, RVT, RDCS Referring Phys: [id number] [place] Comments: This was a limited TTE for LVEF. Previous echo revealed pericardial effusion which was not identified during the current echo and, pulmonary hypertension which was also not identified IMPRESSIONS  1. Left ventricular ejection fraction, by visual estimation, is 30 to 35%. The left ventricle has moderately decreased function. There is mildly increased left ventricular hypertrophy.  2. Left ventricular diastolic parameters are consistent with Grade I diastolic dysfunction (impaired relaxation).  3. Moderately dilated left ventricular internal cavity size.  4. The left ventricle demonstrates global hypokinesis.  5. Global right ventricle has normal systolic function.The right ventricular size is normal. No increase in right ventricular wall thickness.  6. Left atrial size was normal.  7. TR signal is inadequate for assessing pulmonary artery systolic pressure. FINDINGS  Left Ventricle: Left ventricular ejection fraction, by visual estimation, is 30 to 35%. The left ventricle has moderately decreased function. The left ventricle demonstrates global hypokinesis. The left ventricular internal cavity size was moderately dilated left ventricle. There is mildly increased left ventricular hypertrophy. Concentric left ventricular hypertrophy.  Left ventricular diastolic parameters are consistent with Grade I diastolic dysfunction (impaired relaxation). Normal left atrial pressure. Right Ventricle: The right ventricular size is normal. No increase in right ventricular wall thickness. Global RV systolic function is has normal systolic function. Left Atrium: Left atrial size was normal in size. Right Atrium: Right atrial size was normal in size Pericardium: There is no evidence of pericardial effusion. Mitral Valve: The mitral valve is normal in  structure. No evidence of mitral valve stenosis by observation. MV Area by PHT, 3.27 cm. MV PHT, 67.28 msec. Mild mitral valve regurgitation. Tricuspid Valve: The tricuspid valve is normal in structure. Tricuspid valve regurgitation is trivial. Aortic Valve: The aortic valve is normal in structure. Aortic valve regurgitation is not visualized. The aortic valve is structurally normal, with no evidence of sclerosis or stenosis. Pulmonic Valve: The pulmonic valve was normal in structure. Pulmonic valve regurgitation is not visualized. Aorta: The aortic root, ascending aorta and aortic arch are all structurally normal, with no evidence of dilitation or obstruction. Venous: The inferior vena cava is normal in size with greater than 50% respiratory variability, suggesting right atrial pressure of 3 mmHg. Shunts: No ventricular septal defect is seen or detected. There is no evidence of an atrial septal defect. No atrial level shunt detected by color flow Doppler.  LEFT VENTRICLE         Normals PLAX 2D LVIDd:         6.00 cm 3.6 cm        Diastology                 Normals LVIDs:         4.70 cm 1.7 cm        LV e' lateral:   3.37 cm/s 6.42 cm/s LV PW:         1.30 cm 1.4 cm        LV E/e' lateral: 16.6      15.4 LV IVS:        1.30 cm 1.3 cm        LV e' medial:    4.46 cm/s 6.96 cm/s LV SV:         78 ml   79 ml         LV E/e' medial:  12.5      6.96 LV SV Index:   39.08   45 ml/m2  LV Volumes (MOD)             Normals LV area d, A2C:    45.20 cm LV area d, A4C:    44.70 cm LV area s, A2C:    36.50 cm LV area s, A4C:    33.50 cm LV major d, A2C:   9.46 cm LV major d, A4C:   9.29 cm LV major s, A2C:   8.91 cm LV major s, A4C:   8.49 cm LV vol d, MOD A2C: 179.0 ml  68 ml LV vol d, MOD A4C: 174.0 ml LV vol s, MOD A2C: 127.0 ml  24 ml LV vol s, MOD A4C: 111.0 ml LV SV MOD A2C:     52.0 ml LV SV MOD A4C:     174.0 ml LV SV MOD BP:      56.3 ml   45 ml RIGHT VENTRICLE RV S prime:     9.57 cm/s TAPSE (M-mode): 2.0 cm  MITRAL VALVE              Normals MV Area (  PHT): 3.27 cm MV PHT:        67.28 msec 55 ms MV Decel Time: 232 msec   187 ms MV E velocity: 55.80 cm/s 103 cm/s MV A velocity: 78.60 cm/s 70.3 cm/s MV E/A ratio:  0.71       1.5  Ida Rogue MD Electronically signed by Ida Rogue MD Signature Date/Time: 12/13/2018/11:24:01 AM    Final      Labs CBC Latest Ref Rng & Units 02/17/2019 08/11/2018 05/07/2018  WBC 4.0 - 10.5 K/uL 3.0(L) 2.9(L) 2.8(L)  Hemoglobin 13.0 - 17.0 g/dL 11.6(L) 11.3(L) 11.4(L)  Hematocrit 39.0 - 52.0 % 34.7(L) 34.8(L) 35.3(L)  Platelets 150 - 400 K/uL 104(L) 131(L) 136(L)   CMP Latest Ref Rng & Units 02/17/2019 12/23/2018 12/09/2018  Glucose 70 - 99 mg/dL 122(H) 159(H) 129(H)  BUN 8 - 23 mg/dL _0 Creatinine 0.61 - 1.24 mg/dL 1.28(H) 1.39(H) 1.32(H)  Sodium 135 - 145 mmol/L 143 143 143  Potassium 3.5 - 5.1 mmol/L 4.0 3.9 4.3  Chloride 98 - 111 mmol/L 107 107 108(H)  CO2 22 - 32 mmol/L _1 Calcium 8.9 - 10.3 mg/dL 9.1 9.3 9.4  Total Protein 6.5 - 8.1 g/dL 7.0 - -  Total Bilirubin 0.3 - 1.2 mg/dL 0.9 - -  Alkaline Phos 38 - 126 U/L 66 - -  AST 15 - 41 U/L 15 - -  ALT 0 - 44 U/L 11 - -   CEA has been followed:  4.3 on 06/20/2013, 1.8 on 01/06/2014, 1.5 on 09/16/2014, 1.1 on 01/14/2015, 1.5 on 05/16/2015, 1.6 on 09/12/2015, 1.3 on 01/20/2016, 1.6 on 08/24/2016, 1.2 on 01/09/2017, and 1.1 on 09/27/2017.  Assessment:  [name]. is a 74 y.o. male follows up for management of pancytopenia, vitamin B12 deficiency and stage IIIc colon cancer. 1. Malignant neoplasm of sigmoid colon ([place])   2. B12 deficiency   3. Thrombocytopenia ([place])    # Stage IIIC colon cancer:  Clinically patient is doing well.  CEA has been followed.  Today's level is pending. Continue follow-up with gastroenterology for colon polyp surveillance. It has been 5 years after he finishes all his cancer treatments. I discussed about future surveillance plan. I will obtain CT chest abdomen  pelvis in 6 months.  If negative, future image will be obtained if clinically indicated. Patient will continue follow-up with me every 6 to 12 months with history and physical.  #Anemia/thrombocytopenia/leukopenia, This has been a chronic issue for him. Labs are reviewed and discussed with patient. Counts are relatively stable, except thrombocytopenia slightly worse. Discussed with patient and recommend continue observation. If his count is progressively worsens, will obtain bone marrow biopsy for further evaluation.  #History of vitamin B12 deficiency, today's B12 level at 357. Continue monthly vitamin B12 injections.  Orders Placed This Encounter  Procedures  . CT Chest Wo Contrast    Standing Status:   Future    Standing Expiration Date:   02/17/2020    Order Specific Question:   ** REASON FOR EXAM (FREE TEXT)    Answer:   follow for history of colon cancer    Order Specific Question:   Preferred imaging location?    Answer:   [facility]    Order Specific Question:   Radiology Contrast Protocol - do NOT remove file path    Answer:   [email or web address]  . CT Abdomen Pelvis Wo Contrast    Standing Status:   Future    Standing Expiration  Date:   02/17/2020    Order Specific Question:   ** REASON FOR EXAM (FREE TEXT)    Answer:   follow up for history of colon cancer    Order Specific Question:   Preferred imaging location?    Answer:   [facility]    Order Specific Question:   Is Oral Contrast requested for this exam?    Answer:   Yes, Per Radiology protocol    Order Specific Question:   Call Results- Best Contact Number?    Answer:   [phone]    Order Specific Question:   Radiology Contrast Protocol - do NOT remove file path    Answer:   [email or web address]  . CBC with Differential    Standing Status:   Future    Standing Expiration Date:   02/17/2020  . Comprehensive metabolic panel    Standing Status:   Future     Standing Expiration Date:   02/17/2020  . CEA    Standing Status:   Future    Standing Expiration Date:   02/17/2020  . Vitamin B12    Standing Status:   Future    Standing Expiration Date:   02/17/2020  = RTC  monthly Vitamin B12 injections, 6 months for follow-up assessment.  Check CBC, CMP, CEA, vitamin B12   Earlie Server, MD, PhD Hematology Oncology [facility] Pager- [phone] 02/17/2019

## 2019-02-18 LAB — CEA: CEA: 1.7 ng/mL (ref 0.0–4.7)

## 2019-02-26 ENCOUNTER — Other Ambulatory Visit: Payer: Self-pay

## 2019-02-26 MED ORDER — ATORVASTATIN CALCIUM 40 MG PO TABS: 40.0000 mg | ORAL_TABLET | Freq: Every day | ORAL | 0 refills | Status: DC

## 2019-03-20 ENCOUNTER — Inpatient Hospital Stay: Payer: PPO | Attending: Oncology

## 2019-03-20 ENCOUNTER — Other Ambulatory Visit: Payer: Self-pay

## 2019-03-20 DIAGNOSIS — E538 Deficiency of other specified B group vitamins: Secondary | ICD-10-CM | POA: Insufficient documentation

## 2019-03-20 MED ORDER — CYANOCOBALAMIN 1000 MCG/ML IJ SOLN
1000.0000 ug | INTRAMUSCULAR | Status: DC
  Administered 2019-03-20: 15:00:00 1000 ug via INTRAMUSCULAR
  Filled 2019-03-20: qty 1

## 2019-04-08 DIAGNOSIS — I10 Essential (primary) hypertension: Secondary | ICD-10-CM | POA: Diagnosis not present

## 2019-04-08 DIAGNOSIS — D631 Anemia in chronic kidney disease: Secondary | ICD-10-CM | POA: Insufficient documentation

## 2019-04-08 DIAGNOSIS — R319 Hematuria, unspecified: Secondary | ICD-10-CM | POA: Diagnosis not present

## 2019-04-08 DIAGNOSIS — R601 Generalized edema: Secondary | ICD-10-CM | POA: Diagnosis not present

## 2019-04-08 DIAGNOSIS — N183 Chronic kidney disease, stage 3 unspecified: Secondary | ICD-10-CM | POA: Diagnosis not present

## 2019-04-08 DIAGNOSIS — N1831 Chronic kidney disease, stage 3a: Secondary | ICD-10-CM | POA: Insufficient documentation

## 2019-04-08 DIAGNOSIS — I129 Hypertensive chronic kidney disease with stage 1 through stage 4 chronic kidney disease, or unspecified chronic kidney disease: Secondary | ICD-10-CM | POA: Diagnosis not present

## 2019-04-08 DIAGNOSIS — E1129 Type 2 diabetes mellitus with other diabetic kidney complication: Secondary | ICD-10-CM | POA: Diagnosis not present

## 2019-04-08 DIAGNOSIS — N2581 Secondary hyperparathyroidism of renal origin: Secondary | ICD-10-CM | POA: Diagnosis not present

## 2019-04-08 DIAGNOSIS — N189 Chronic kidney disease, unspecified: Secondary | ICD-10-CM | POA: Insufficient documentation

## 2019-04-08 DIAGNOSIS — E1122 Type 2 diabetes mellitus with diabetic chronic kidney disease: Secondary | ICD-10-CM | POA: Diagnosis not present

## 2019-04-08 DIAGNOSIS — R809 Proteinuria, unspecified: Secondary | ICD-10-CM | POA: Diagnosis not present

## 2019-04-14 ENCOUNTER — Ambulatory Visit (INDEPENDENT_AMBULATORY_CARE_PROVIDER_SITE_OTHER): Payer: PPO | Admitting: Cardiovascular Disease

## 2019-04-14 ENCOUNTER — Other Ambulatory Visit: Payer: Self-pay | Admitting: Family Medicine

## 2019-04-14 ENCOUNTER — Encounter: Payer: Self-pay | Admitting: Cardiovascular Disease

## 2019-04-14 ENCOUNTER — Other Ambulatory Visit: Payer: Self-pay

## 2019-04-14 ENCOUNTER — Other Ambulatory Visit: Payer: Self-pay | Admitting: Urology

## 2019-04-14 VITALS — BP 132/64 | HR 66 | Ht 69.0 in | Wt 186.5 lb

## 2019-04-14 DIAGNOSIS — I2510 Atherosclerotic heart disease of native coronary artery without angina pectoris: Secondary | ICD-10-CM

## 2019-04-14 DIAGNOSIS — E785 Hyperlipidemia, unspecified: Secondary | ICD-10-CM

## 2019-04-14 DIAGNOSIS — I5022 Chronic systolic (congestive) heart failure: Secondary | ICD-10-CM

## 2019-04-14 DIAGNOSIS — I10 Essential (primary) hypertension: Secondary | ICD-10-CM | POA: Diagnosis not present

## 2019-04-14 MED ORDER — SPIRONOLACTONE 25 MG PO TABS: 25.0000 mg | ORAL_TABLET | Freq: Every day | ORAL | 1 refills | Status: DC

## 2019-04-14 NOTE — Progress Notes (Signed)
Cardiology Office Note   Date:  04/14/2019   ID:  Christopher Hurford., DOB 04/30/1945, MRN [id number]  PCP:  Leone Haven, MD  Cardiologist:   Kathlyn Sacramento, MD   Chief Complaint  Patient presents with  . OTHER    3 month f/u no complaints today. Meds reviewed verbally with pt.      History of Present Illness: Christopher Mode. is a 74 y.o. male who presents for a follow-up visit regarding chronic systolic heart failure and coronary artery disease. He has known history of colon cancer status post colectomy in 2015 followed by chemotherapy with oxaliplatin and fluorouracil. He had neuropathy as a result.  He is not a smoker and has not had any alcohol in 10 years. He is not diabetic.   He was diagnosed with acute systolic heart failure in January 2017. Echocardiogram showed severely reduced LV systolic function with an ejection fraction of 20-25%, moderate mitral regurgitation, moderate tricuspid regurgitation with moderate to severe pulmonary hypertension. Right and left cardiac catheterization showed moderate to severe pulmonary hypertension with severely elevated filling pressures with severely reduced cardiac output. Coronary angiography showed severe one-vessel coronary artery disease with 80-90% stenosis in the proximal left circumflex with mild diffuse disease affecting the LAD and RCA. He was started on heart failure medications and subsequently underwent staged PCI of the left circumflex in May 2017.  Repeat echocardiogram in November 2020 showed an EF of 30 to 35%.  He was noted to have PVCs and thus he had a 4-day outpatient monitor which showed sinus rhythm with frequent PACs and frequent PVCs with a burden of 6%. I resumed small dose carvedilol during last visit and he has been tolerating the medication.  He reports no chest pain, shortness of breath or palpitations.  No dizziness.   Past Medical History:  Diagnosis Date  . Acute renal failure (ARF) ([place]) 08/14/2017   . Anemia   . CAD (coronary artery disease)    a. 02/2015 Cath: LM nl, LAD 50/40 mid/distal, LCX 80p, RCA 40p, 30d, RPL1 40, CO 3.27, CI 1.65; b. cath 06/09/15: LM minor irregs, m-dLAD 50%, dLAD 40%, pLCx 80% s/p PCI/DES 0%, pRCA 40%, dRCA 30%, 1st RPLB 40%  . Chronic systolic CHF (congestive heart failure) ([place])    a. 02/2015 Echo: EF 20-25%, diff HK, mod MR, mildy dil LA, mildly dil RV w/ mod RV syst dysfxn, mildly dil RA, mod TR, PASP 56mmHg; b. 09/2017 Echo: EF 40-45%, diff HK. Gr1 DD. Mild to mod MR. Mildly to mod dil LA.  . Colon cancer ([place])    a. 2015 s/p colectomy followed by chemoRx with oxaliplatin & fluorouacil.  . Dia hypertension 07/20/2014  . Diabetes mellitus without complication ([place])   . Hypertension   . Hypertensive heart disease   . Mixed Ischemic and Non-ischemic Cardiomyopathy    a. 02/2015 Echo: EF 20-25%, diff HK; b. 09/2016 Echo: EF 40-45%.  . Moderate mitral regurgitation    a. 09/2017 Echo: Mild to mod TR.  . Tubular adenoma of colon     Past Surgical History:  Procedure Laterality Date  . CARDIAC CATHETERIZATION Bilateral 02/24/2015   Procedure: Right/Left Heart Cath and Coronary Angiography;  Surgeon: Wellington Hampshire, MD;  Location: [place] [facility];  Service: Cardiovascular;  Laterality: Bilateral;  . CARDIAC CATHETERIZATION N/A 06/09/2015   Procedure: Coronary Stent Intervention;  Surgeon: Wellington Hampshire, MD;  Location: [place] [facility];  Service: Cardiovascular;  Laterality: N/A;  .  COLON SURGERY     Colectomy  . COLONOSCOPY    . COLONOSCOPY WITH PROPOFOL N/A 12/11/2016   Procedure: COLONOSCOPY WITH PROPOFOL;  Surgeon: Lollie Sails, MD;  Location: [facility];  Service: Endoscopy;  Laterality: N/A;  . ESOPHAGOGASTRODUODENOSCOPY    . PORT-A-CATH REMOVAL N/A 07/12/2014   Procedure: REMOVAL PORT-A-CATH;  Surgeon: Molly Maduro, MD;  Location: [facility] ORS;  Service: General;  Laterality: N/A;  . PORTACATH PLACEMENT Left      Current Outpatient  Medications  Medication Sig Dispense Refill  . aspirin EC 81 MG tablet Take 1 tablet (81 mg total) by mouth daily. 90 tablet 3  . atorvastatin (LIPITOR) 40 MG tablet Take 1 tablet (40 mg total) by mouth daily. 90 tablet 0  . carvedilol (COREG) 3.125 MG tablet Take 1 tablet (3.125 mg total) by mouth 2 (two) times daily. 180 tablet 3  . furosemide (LASIX) 40 MG tablet Take 1 tablet (40 mg total) on M,W,F, then take 0.5 tablet (20 mg total) on other days. 90 tablet 3  . gabapentin (NEURONTIN) 100 MG capsule Take 2 capsules (200 mg total) by mouth 3 (three) times daily. 180 capsule 0  . metFORMIN (GLUCOPHAGE) 500 MG tablet TAKE 1 TABLET(500 MG) BY MOUTH TWICE DAILY WITH A MEAL 180 tablet 3  . sacubitril-valsartan (ENTRESTO) 24-26 MG Take 1 tablet by mouth 2 (two) times daily. 60 tablet 6  . spironolactone (ALDACTONE) 25 MG tablet Take 0.5 tablets (12.5 mg total) by mouth daily. 45 tablet 2  . tamsulosin (FLOMAX) 0.4 MG CAPS capsule TAKE 1 CAPSULE(0.4 MG) BY MOUTH DAILY AFTER SUPPER 30 capsule 11   No current facility-administered medications for this visit.    Allergies:   Shellfish allergy    Social History:  The patient  reports that he has never smoked. He has never used smokeless tobacco. He reports that he does not drink alcohol or use drugs.   Family History:  The patient's family history includes Cancer in his mother.    ROS:  Please see the history of present illness.   Otherwise, review of systems are positive for none.   All other systems are reviewed and negative.    PHYSICAL EXAM: VS:  BP 132/64 (BP Location: Left Arm, Patient Position: Sitting, Cuff Size: Normal)   Pulse 66   Ht 5\' 9"  (1.753 m)   Wt 186 lb 8 oz (84.6 kg)   SpO2 98%   BMI 27.54 kg/m  , BMI Body mass index is 27.54 kg/m. GEN: Well nourished, well developed, in no acute distress  HEENT: normal  Neck: no JVD, carotid bruits, or masses Cardiac: RRR; no murmurs, rubs, or gallops,no edema  Respiratory:  clear  to auscultation bilaterally, normal work of breathing GI: soft, nontender, nondistended, + BS MS: no deformity or atrophy  Skin: warm and dry, no rash Neuro:  Strength and sensation are intact Psych: euthymic mood, full affect   EKG:  EKG is ordered today.   EKG shows normal sinus rhythm with nonspecific T wave changes.  Heart rate is 66 bpm.   Recent Labs: 12/09/2018: Magnesium 1.8 02/17/2019: ALT 11; BUN 21; Creatinine, Ser 1.28; Hemoglobin 11.6; Platelets 104; Potassium 4.0; Sodium 143    Lipid Panel    Component Value Date/Time   CHOL 110 12/30/2017 1058   CHOL 143 08/02/2016 1110   TRIG 35 12/30/2017 1058   HDL 50 12/30/2017 1058   HDL 69 08/02/2016 1110   CHOLHDL 2.2 12/30/2017 1058   VLDL 7  12/30/2017 1058   LDLCALC 53 12/30/2017 1058   LDLCALC 64 08/02/2016 1110      Wt Readings from Last 3 Encounters:  04/14/19 186 lb 8 oz (84.6 kg)  02/17/19 183 lb 9.6 oz (83.3 kg)  01/13/19 181 lb 8 oz (82.3 kg)        ASSESSMENT AND PLAN:  1.  Coronary artery disease involving native coronary arteries without angina:  He is status post PCI and drug-eluting stent placement to the left circumflex  : He is doing well overall.  Continue medical therapy.  2. Chronic systolic heart failure:  He appears to be euvolemic . He is currently New York Heart Association class II.  Most recent ejection fraction was 30 to 35% .  I increased spironolactone to 25 mg once daily.  Continue carvedilol and Entresto.  We will consider repeat echocardiogram in the next 6 months to evaluate ejection fraction on optimal medical therapy.  If EF remains less than 35%, EP evaluation for ICD would be considered.  3. Essential hypertension: Blood pressure is reasonably controlled.  4. Hyperlipidemia: Continue treatment with atorvastatin.  Most recent LDL was 53.  5.  Frequent PVCs: Carvedilol was resumed during last visit.  EKG today does not show any PVCs and no premature beats by physical exam.     Disposition:   FU with me in 4 months  Signed,  Kathlyn Sacramento, MD  04/14/2019 1:57 PM    [facility]

## 2019-04-14 NOTE — Patient Instructions (Signed)
Medication Instructions:  Your physician has recommended you make the following change in your medication:   INCREASE Spironolactone to 25 mg daily. An Rx has been sent to your pharmacy.  *If you need a refill on your cardiac medications before your next appointment, please call your pharmacy*   Lab Work: None ordered If you have labs (blood work) drawn today and your tests are completely normal, you will receive your results only by: Marland Kitchen MyChart Message (if you have MyChart) OR . A paper copy in the mail If you have any lab test that is abnormal or we need to change your treatment, we will call you to review the results.   Testing/Procedures: None ordered   Follow-Up: At [facility], you and your health needs are our priority.  As part of our continuing mission to provide you with exceptional heart care, we have created designated Provider Care Teams.  These Care Teams include your primary Cardiologist (physician) and Advanced Practice Providers (APPs -  Physician Assistants and Nurse Practitioners) who all work together to provide you with the care you need, when you need it.  We recommend signing up for the patient portal called "MyChart".  Sign up information is provided on this After Visit Summary.  MyChart is used to connect with patients for Virtual Visits (Telemedicine).  Patients are able to view lab/test results, encounter notes, upcoming appointments, etc.  Non-urgent messages can be sent to your provider as well.   To learn more about what you can do with MyChart, go to [email or web address].    Your next appointment:   4 month(s)  The format for your next appointment:   In Person  Provider:    You may see Kathlyn Sacramento, MD or one of the following Advanced Practice Providers on your designated Care Team:    Murray Hodgkins, NP  Christell Faith, PA-C  Marrianne Mood, PA-C    Other Instructions N/A

## 2019-04-20 ENCOUNTER — Other Ambulatory Visit: Payer: Self-pay

## 2019-04-20 ENCOUNTER — Inpatient Hospital Stay: Payer: PPO | Attending: Oncology

## 2019-04-20 DIAGNOSIS — E538 Deficiency of other specified B group vitamins: Secondary | ICD-10-CM | POA: Insufficient documentation

## 2019-04-20 MED ORDER — CYANOCOBALAMIN 1000 MCG/ML IJ SOLN
1000.0000 ug | INTRAMUSCULAR | Status: DC
  Administered 2019-04-20: 1000 ug via INTRAMUSCULAR
  Filled 2019-04-20: qty 1

## 2019-05-22 ENCOUNTER — Inpatient Hospital Stay: Payer: PPO | Attending: Oncology

## 2019-05-22 ENCOUNTER — Other Ambulatory Visit: Payer: Self-pay

## 2019-05-22 DIAGNOSIS — E538 Deficiency of other specified B group vitamins: Secondary | ICD-10-CM | POA: Diagnosis not present

## 2019-05-22 DIAGNOSIS — Z85038 Personal history of other malignant neoplasm of large intestine: Secondary | ICD-10-CM | POA: Insufficient documentation

## 2019-05-22 MED ORDER — CYANOCOBALAMIN 1000 MCG/ML IJ SOLN
1000.0000 ug | INTRAMUSCULAR | Status: DC
  Administered 2019-05-22: 1000 ug via INTRAMUSCULAR
  Filled 2019-05-22: qty 1

## 2019-05-25 ENCOUNTER — Other Ambulatory Visit: Payer: Self-pay

## 2019-05-25 MED ORDER — ATORVASTATIN CALCIUM 40 MG PO TABS: 40.0000 mg | ORAL_TABLET | Freq: Every day | ORAL | 0 refills | Status: DC

## 2019-06-02 ENCOUNTER — Other Ambulatory Visit: Payer: Self-pay | Admitting: Physician Assistant

## 2019-06-22 ENCOUNTER — Inpatient Hospital Stay: Payer: PPO | Attending: Oncology

## 2019-06-22 ENCOUNTER — Other Ambulatory Visit: Payer: Self-pay

## 2019-06-22 DIAGNOSIS — Z85038 Personal history of other malignant neoplasm of large intestine: Secondary | ICD-10-CM | POA: Diagnosis not present

## 2019-06-22 DIAGNOSIS — E538 Deficiency of other specified B group vitamins: Secondary | ICD-10-CM | POA: Diagnosis not present

## 2019-06-22 MED ORDER — CYANOCOBALAMIN 1000 MCG/ML IJ SOLN
1000.0000 ug | INTRAMUSCULAR | Status: DC
  Administered 2019-06-22: 1000 ug via INTRAMUSCULAR
  Filled 2019-06-22: qty 1

## 2019-07-24 ENCOUNTER — Inpatient Hospital Stay: Payer: PPO | Attending: Oncology

## 2019-07-24 ENCOUNTER — Other Ambulatory Visit: Payer: Self-pay

## 2019-07-24 DIAGNOSIS — E538 Deficiency of other specified B group vitamins: Secondary | ICD-10-CM | POA: Diagnosis not present

## 2019-07-24 MED ORDER — CYANOCOBALAMIN 1000 MCG/ML IJ SOLN
1000.0000 ug | INTRAMUSCULAR | Status: DC
  Administered 2019-07-24: 1000 ug via INTRAMUSCULAR
  Filled 2019-07-24: qty 1

## 2019-08-11 ENCOUNTER — Ambulatory Visit (INDEPENDENT_AMBULATORY_CARE_PROVIDER_SITE_OTHER): Payer: PPO | Admitting: Cardiovascular Disease

## 2019-08-11 ENCOUNTER — Other Ambulatory Visit: Payer: Self-pay

## 2019-08-11 ENCOUNTER — Encounter: Payer: Self-pay | Admitting: Cardiovascular Disease

## 2019-08-11 VITALS — BP 128/60 | HR 66 | Ht 69.0 in | Wt 184.4 lb

## 2019-08-11 DIAGNOSIS — I10 Essential (primary) hypertension: Secondary | ICD-10-CM | POA: Diagnosis not present

## 2019-08-11 DIAGNOSIS — E785 Hyperlipidemia, unspecified: Secondary | ICD-10-CM

## 2019-08-11 DIAGNOSIS — I5022 Chronic systolic (congestive) heart failure: Secondary | ICD-10-CM | POA: Diagnosis not present

## 2019-08-11 DIAGNOSIS — I2510 Atherosclerotic heart disease of native coronary artery without angina pectoris: Secondary | ICD-10-CM | POA: Diagnosis not present

## 2019-08-11 NOTE — Progress Notes (Signed)
Cardiology Office Note   Date:  08/11/2019   ID:  Christopher Port., DOB 10-03-1945, MRN [id number]  PCP:  Leone Haven, MD  Cardiologist:   Kathlyn Sacramento, MD   Chief Complaint  Patient presents with  . other    4 month follow up. Meds reviewed by the pt. verbally. "doing well."       History of Present Illness: Christopher Boissonneault. is a 74 y.o. male who presents for a follow-up visit regarding chronic systolic heart failure and coronary artery disease. He has known history of colon cancer status post colectomy in 2015 followed by chemotherapy with oxaliplatin and fluorouracil. He had neuropathy as a result.  He is not a smoker and has not had any alcohol in 10 years. He is not diabetic.   He was diagnosed with acute systolic heart failure in January 2017. Echocardiogram showed severely reduced LV systolic function with an ejection fraction of 20-25%, moderate mitral regurgitation, moderate tricuspid regurgitation with moderate to severe pulmonary hypertension. Right and left cardiac catheterization showed moderate to severe pulmonary hypertension with severely elevated filling pressures with severely reduced cardiac output. Coronary angiography showed severe one-vessel coronary artery disease with 80-90% stenosis in the proximal left circumflex with mild diffuse disease affecting the LAD and RCA. He was started on heart failure medications and subsequently underwent staged PCI of the left circumflex in May 2017.  Repeat echocardiogram in November 2020 showed an EF of 30 to 35%.  He was noted to have PVCs and thus he had a 4-day outpatient monitor which showed sinus rhythm with frequent PACs and frequent PVCs with a burden of 6%. PVCs improved with resumption of carvedilol.  Also spironolactone was added after his echo.  He has been doing well with no recent chest pain, shortness of breath or palpitation.  No significant change in weight.   Past Medical History:  Diagnosis Date    . Acute renal failure (ARF) ([place]) 08/14/2017  . Anemia   . CAD (coronary artery disease)    a. 02/2015 Cath: LM nl, LAD 50/40 mid/distal, LCX 80p, RCA 40p, 30d, RPL1 40, CO 3.27, CI 1.65; b. cath 06/09/15: LM minor irregs, m-dLAD 50%, dLAD 40%, pLCx 80% s/p PCI/DES 0%, pRCA 40%, dRCA 30%, 1st RPLB 40%  . Chronic systolic CHF (congestive heart failure) ([place])    a. 02/2015 Echo: EF 20-25%, diff HK, mod MR, mildy dil LA, mildly dil RV w/ mod RV syst dysfxn, mildly dil RA, mod TR, PASP 69mmHg; b. 09/2017 Echo: EF 40-45%, diff HK. Gr1 DD. Mild to mod MR. Mildly to mod dil LA.  . Colon cancer ([place])    a. 2015 s/p colectomy followed by chemoRx with oxaliplatin & fluorouacil.  . Dia hypertension 07/20/2014  . Diabetes mellitus without complication ([place])   . Hypertension   . Hypertensive heart disease   . Mixed Ischemic and Non-ischemic Cardiomyopathy    a. 02/2015 Echo: EF 20-25%, diff HK; b. 09/2016 Echo: EF 40-45%.  . Moderate mitral regurgitation    a. 09/2017 Echo: Mild to mod TR.  . Tubular adenoma of colon     Past Surgical History:  Procedure Laterality Date  . CARDIAC CATHETERIZATION Bilateral 02/24/2015   Procedure: Right/Left Heart Cath and Coronary Angiography;  Surgeon: Wellington Hampshire, MD;  Location: [place] [facility];  Service: Cardiovascular;  Laterality: Bilateral;  . CARDIAC CATHETERIZATION N/A 06/09/2015   Procedure: Coronary Stent Intervention;  Surgeon: Wellington Hampshire, MD;  Location:  [place] [facility];  Service: Cardiovascular;  Laterality: N/A;  . COLON SURGERY     Colectomy  . COLONOSCOPY    . COLONOSCOPY WITH PROPOFOL N/A 12/11/2016   Procedure: COLONOSCOPY WITH PROPOFOL;  Surgeon: Lollie Sails, MD;  Location: [facility] ENDOSCOPY;  Service: Endoscopy;  Laterality: N/A;  . ESOPHAGOGASTRODUODENOSCOPY    . PORT-A-CATH REMOVAL N/A 07/12/2014   Procedure: REMOVAL PORT-A-CATH;  Surgeon: Molly Maduro, MD;  Location: [facility] ORS;  Service: General;  Laterality: N/A;  . PORTACATH  PLACEMENT Left      Current Outpatient Medications  Medication Sig Dispense Refill  . aspirin EC 81 MG tablet Take 1 tablet (81 mg total) by mouth daily. 90 tablet 3  . atorvastatin (LIPITOR) 40 MG tablet Take 1 tablet (40 mg total) by mouth daily. 90 tablet 0  . carvedilol (COREG) 3.125 MG tablet Take 1 tablet (3.125 mg total) by mouth 2 (two) times daily. 180 tablet 3  . ENTRESTO 24-26 MG TAKE 1 TABLET BY MOUTH TWICE DAILY 60 tablet 6  . furosemide (LASIX) 40 MG tablet Take 1 tablet (40 mg total) on M,W,F, then take 0.5 tablet (20 mg total) on other days. 90 tablet 3  . gabapentin (NEURONTIN) 100 MG capsule Take 2 capsules (200 mg total) by mouth 3 (three) times daily. 180 capsule 0  . metFORMIN (GLUCOPHAGE) 500 MG tablet TAKE 1 TABLET(500 MG) BY MOUTH TWICE DAILY WITH A MEAL 180 tablet 3  . spironolactone (ALDACTONE) 25 MG tablet Take 1 tablet (25 mg total) by mouth daily. 90 tablet 1  . tamsulosin (FLOMAX) 0.4 MG CAPS capsule TAKE 1 CAPSULE(0.4 MG) BY MOUTH DAILY AFTER SUPPER 30 capsule 11   No current facility-administered medications for this visit.    Allergies:   Shellfish allergy    Social History:  The patient  reports that he has never smoked. He has never used smokeless tobacco. He reports that he does not drink alcohol and does not use drugs.   Family History:  The patient's family history includes Cancer in his mother.    ROS:  Please see the history of present illness.   Otherwise, review of systems are positive for none.   All other systems are reviewed and negative.    PHYSICAL EXAM: VS:  BP 128/60 (BP Location: Left Arm, Patient Position: Sitting, Cuff Size: Normal)   Pulse 66   Ht 5\' 9"  (1.753 m)   Wt 184 lb 6 oz (83.6 kg)   SpO2 99%   BMI 27.23 kg/m  , BMI Body mass index is 27.23 kg/m. GEN: Well nourished, well developed, in no acute distress  HEENT: normal  Neck: no JVD, carotid bruits, or masses Cardiac: RRR; no murmurs, rubs, or gallops,no edema    Respiratory:  clear to auscultation bilaterally, normal work of breathing GI: soft, nontender, nondistended, + BS MS: no deformity or atrophy  Skin: warm and dry, no rash Neuro:  Strength and sensation are intact Psych: euthymic mood, full affect   EKG:  EKG is ordered today.   EKG shows normal sinus rhythm with marked sinus arrhythmia possible old inferior infarct and lateral T wave changes suggestive of ischemia.   Recent Labs: 12/09/2018: Magnesium 1.8 02/17/2019: ALT 11; BUN 21; Creatinine, Ser 1.28; Hemoglobin 11.6; Platelets 104; Potassium 4.0; Sodium 143    Lipid Panel    Component Value Date/Time   CHOL 110 12/30/2017 1058   CHOL 143 08/02/2016 1110   TRIG 35 12/30/2017 1058   HDL 50 12/30/2017  1058   HDL 69 08/02/2016 1110   CHOLHDL 2.2 12/30/2017 1058   VLDL 7 12/30/2017 1058   LDLCALC 53 12/30/2017 1058   LDLCALC 64 08/02/2016 1110      Wt Readings from Last 3 Encounters:  08/11/19 184 lb 6 oz (83.6 kg)  04/14/19 186 lb 8 oz (84.6 kg)  02/17/19 183 lb 9.6 oz (83.3 kg)        ASSESSMENT AND PLAN:  1.  Coronary artery disease involving native coronary arteries without angina:  He is status post PCI and drug-eluting stent placement to the left circumflex  : He is doing well overall.  Continue medical therapy.  2. Chronic systolic heart failure:  He appears to be euvolemic . He is currently New York Heart Association class II.  Most recent ejection fraction was 30 to 35% .  He appears to be euvolemic.  Continue treatment with carvedilol, Entresto and spironolactone.  Check basic metabolic profile today.  Repeat echocardiogram next month.  If EF is below 35%, recommend referral to EP for ICD placement. 3. Essential hypertension: Blood pressure is reasonably controlled.  4. Hyperlipidemia: Continue treatment with atorvastatin.  Most recent LDL was 53.  5.  Frequent PVCs: Improved significantly with resumption of carvedilol.  I do not see evidence of PVCs today  by exam or on EKG.   Disposition:   FU with me in 6 months  Signed,  Kathlyn Sacramento, MD  08/11/2019 2:07 PM    [place]

## 2019-08-11 NOTE — Patient Instructions (Addendum)
Medication Instructions:  Your physician recommends that you continue on your current medications as directed. Please refer to the Current Medication list given to you today.  *If you need a refill on your cardiac medications before your next appointment, please call your pharmacy*   Lab Work: Bmet today If you have labs (blood work) drawn today and your tests are completely normal, you will receive your results only by: Marland Kitchen MyChart Message (if you have MyChart) OR . A paper copy in the mail If you have any lab test that is abnormal or we need to change your treatment, we will call you to review the results.   Testing/Procedures: Your physician has requested that you have an echocardiogram. Echocardiography is a painless test that uses sound waves to create images of your heart. It provides your doctor with information about the size and shape of your heart and how well your heart's chambers and valves are working. This procedure takes approximately one hour. There are no restrictions for this procedure. (To be scheduled in 4 weeks)    Follow-Up: At [facility], you and your health needs are our priority.  As part of our continuing mission to provide you with exceptional heart care, we have created designated Provider Care Teams.  These Care Teams include your primary Cardiologist (physician) and Advanced Practice Providers (APPs -  Physician Assistants and Nurse Practitioners) who all work together to provide you with the care you need, when you need it.  We recommend signing up for the patient portal called "MyChart".  Sign up information is provided on this After Visit Summary.  MyChart is used to connect with patients for Virtual Visits (Telemedicine).  Patients are able to view lab/test results, encounter notes, upcoming appointments, etc.  Non-urgent messages can be sent to your provider as well.   To learn more about what you can do with MyChart, go to [email or web address].     Your next appointment:   6 month(s)  The format for your next appointment:   In Person  Provider:    You may see Kathlyn Sacramento, MD or one of the following Advanced Practice Providers on your designated Care Team:    Murray Hodgkins, NP  Christell Faith, PA-C  Marrianne Mood, PA-C    Other Instructions   Echocardiogram An echocardiogram is a procedure that uses painless sound waves (ultrasound) to produce an image of the heart. Images from an echocardiogram can provide important information about:  Signs of coronary artery disease (CAD).  Aneurysm detection. An aneurysm is a weak or damaged part of an artery wall that bulges out from the normal force of blood pumping through the body.  Heart size and shape. Changes in the size or shape of the heart can be associated with certain conditions, including heart failure, aneurysm, and CAD.  Heart muscle function.  Heart valve function.  Signs of a past heart attack.  Fluid buildup around the heart.  Thickening of the heart muscle.  A tumor or infectious growth around the heart valves. Tell a health care provider about:  Any allergies you have.  All medicines you are taking, including vitamins, herbs, eye drops, creams, and over-the-counter medicines.  Any blood disorders you have.  Any surgeries you have had.  Any medical conditions you have.  Whether you are pregnant or may be pregnant. What are the risks? Generally, this is a safe procedure. However, problems may occur, including:  Allergic reaction to dye (contrast) that may be used during the procedure.  What happens before the procedure? No specific preparation is needed. You may eat and drink normally. What happens during the procedure?   An IV tube may be inserted into one of your veins.  You may receive image enhacer through this tube. A image enhancert is an injection that improves the quality of the pictures from your heart.  A gel will be  applied to your chest.  A wand-like tool (transducer) will be moved over your chest. The gel will help to transmit the sound waves from the transducer.  The sound waves will harmlessly bounce off of your heart to allow the heart images to be captured in real-time motion. The images will be recorded on a computer. The procedure may vary among health care providers and hospitals. What happens after the procedure?  You may return to your normal, everyday life, including diet, activities, and medicines, unless your health care provider tells you not to do that. Summary  An echocardiogram is a procedure that uses painless sound waves (ultrasound) to produce an image of the heart.  Images from an echocardiogram can provide important information about the size and shape of your heart, heart muscle function, heart valve function, and fluid buildup around your heart.  You do not need to do anything to prepare before this procedure. You may eat and drink normally.  After the echocardiogram is completed, you may return to your normal, everyday life, unless your health care provider tells you not to do that. This information is not intended to replace advice given to you by your health care provider. Make sure you discuss any questions you have with your health care provider. Document Revised: 05/15/2018 Document Reviewed: 02/25/2016 Elsevier Patient Education  [place].

## 2019-08-12 ENCOUNTER — Telehealth: Payer: Self-pay | Admitting: Family Medicine

## 2019-08-12 LAB — BASIC METABOLIC PANEL
BUN/Creatinine Ratio: 9 — ABNORMAL LOW (ref 10–24)
BUN: 13 mg/dL (ref 8–27)
CO2: 24 mmol/L (ref 20–29)
Calcium: 9 mg/dL (ref 8.6–10.2)
Chloride: 106 mmol/L (ref 96–106)
Creatinine, Ser: 1.45 mg/dL — ABNORMAL HIGH (ref 0.76–1.27)
GFR calc Af Amer: 55 mL/min/{1.73_m2} — ABNORMAL LOW (ref >59)
GFR calc non Af Amer: 48 mL/min/{1.73_m2} — ABNORMAL LOW (ref >59)
Glucose: 116 mg/dL — ABNORMAL HIGH (ref 65–99)
Potassium: 3.8 mmol/L (ref 3.5–5.2)
Sodium: 145 mmol/L — ABNORMAL HIGH (ref 134–144)

## 2019-08-12 NOTE — Telephone Encounter (Signed)
Left message for patient to call back and schedule Medicare Annual Wellness Visit (AWV) either virtually or audio only.  No hx of AWV; please schedule at anytime with Denisa O'Brien-Blaney at [place] PCP

## 2019-08-13 ENCOUNTER — Ambulatory Visit: Payer: PPO | Admitting: Urology

## 2019-08-17 ENCOUNTER — Other Ambulatory Visit: Payer: Self-pay

## 2019-08-17 ENCOUNTER — Ambulatory Visit
Admission: RE | Admit: 2019-08-17 | Discharge: 2019-08-17 | Disposition: A | Discharge status: Home / Self Care | Payer: PPO | Source: Ambulatory Visit | Attending: Oncology | Admitting: Oncology

## 2019-08-17 DIAGNOSIS — I700 Atherosclerosis of aorta: Secondary | ICD-10-CM | POA: Diagnosis not present

## 2019-08-17 DIAGNOSIS — R590 Localized enlarged lymph nodes: Secondary | ICD-10-CM | POA: Diagnosis not present

## 2019-08-17 DIAGNOSIS — C187 Malignant neoplasm of sigmoid colon: Secondary | ICD-10-CM | POA: Insufficient documentation

## 2019-08-17 DIAGNOSIS — N433 Hydrocele, unspecified: Secondary | ICD-10-CM | POA: Diagnosis not present

## 2019-08-17 DIAGNOSIS — R918 Other nonspecific abnormal finding of lung field: Secondary | ICD-10-CM | POA: Diagnosis not present

## 2019-08-17 DIAGNOSIS — N2889 Other specified disorders of kidney and ureter: Secondary | ICD-10-CM | POA: Diagnosis not present

## 2019-08-18 ENCOUNTER — Ambulatory Visit (INDEPENDENT_AMBULATORY_CARE_PROVIDER_SITE_OTHER): Payer: PPO | Admitting: Urology

## 2019-08-18 ENCOUNTER — Encounter: Payer: Self-pay | Admitting: Urology

## 2019-08-18 VITALS — BP 156/73 | HR 74 | Ht 69.0 in | Wt 183.1 lb

## 2019-08-18 DIAGNOSIS — Z87448 Personal history of other diseases of urinary system: Secondary | ICD-10-CM | POA: Diagnosis not present

## 2019-08-18 DIAGNOSIS — N402 Nodular prostate without lower urinary tract symptoms: Secondary | ICD-10-CM | POA: Diagnosis not present

## 2019-08-18 DIAGNOSIS — N138 Other obstructive and reflux uropathy: Secondary | ICD-10-CM | POA: Diagnosis not present

## 2019-08-18 DIAGNOSIS — N401 Enlarged prostate with lower urinary tract symptoms: Secondary | ICD-10-CM

## 2019-08-18 LAB — BLADDER SCAN AMB NON-IMAGING

## 2019-08-18 NOTE — Progress Notes (Signed)
02/13/2018 11:48 PM   [name] 03-May-1945 [id number]  Referring provider: Leone Haven, MD [address] [place],  [place] [address]  Chief Complaint  Patient presents with  . Follow-up    HPI: Patient is a 74 -year-old male with a history of hematuria, BPH with LUTS and a prostate nodule who returns today for a 12 months follow up.  History of hematuria (high risk) Non-smoker.  CTU in 09/2017 noted no definite source for hematuria identified on today's examination. Specifically, no urinary tract calculi and no findings of urinary tract obstruction. In addition to multiple simple cysts there are several subcentimeter low-attenuation lesions in the kidneys bilaterally. These are too small to definitively characterize, but are favored to represent small cysts. Should the patient's hematuria persist or worsen, these could be definitively characterized with MRI of the abdomen with and without IV gadolinium if clinically appropriate.  Cholelithiasis without evidence of acute cholecystitis at this time.  Aortic atherosclerosis, in addition to at least 2 vessel coronary artery disease. Assessment for potential risk factor modification, dietary therapy or pharmacologic therapy may be warranted, if clinically indicated.   Cardiomegaly.  Bilateral hydroceles incompletely imaged (left greater than right).  Aortic Atherosclerosis (ICD10-I70.0).  Non-contrast CT on 08/07/2018 noted normal adrenals. No renal stones. No hydronephrosis. Simple 3.1 cm posterior lower right renal cyst.  Simple 1.5 cm posterior lower left renal cyst. No additional contour deforming renal lesions. Chronic mild diffuse bladder wall thickening. Bladder is nondistended.  Cystoscopy with Dr. Bernardo Heater in 09/2017 noted an enlarged prostate.  He does not endorse any gross hematuria.  His UA today is negative.  BPH WITH LUTS  (prostate and/or bladder) IPSS Score: 6/1  Previous IPSS score: 3/1   PVR is 6   Major  complaint(s): No complaints at this time.  Denies any dysuria, hematuria or suprapubic pain.   Currently taking: tamsulosin 0.4 mg daily  Denies any recent fevers, chills, nausea or vomiting.   IPSS    Row Name 08/18/19 1500         International Prostate Symptom Score   How often have you had the sensation of not emptying your bladder? Not at All     How often have you had to urinate less than every two hours? Less than half the time     How often have you found you stopped and started again several times when you urinated? Less than 1 in 5 times     How often have you found it difficult to postpone urination? Less than 1 in 5 times     How often have you had a weak urinary stream? Not at All     How often have you had to strain to start urination? Not at All     How many times did you typically get up at night to urinate? 2 Times     Total IPSS Score 6       Quality of Life due to urinary symptoms   If you were to spend the rest of your life with your urinary condition just the way it is now how would you feel about that? Pleased            Score:  1-7 Mild 8-19 Moderate 20-35 Severe  PMH: Past Medical History:  Diagnosis Date  . Acute renal failure (ARF) ([facility]) 08/14/2017  . Anemia   . CAD (coronary artery disease)    a. 02/2015 Cath: LM nl, LAD  50/40 mid/distal, LCX 80p, RCA 40p, 30d, RPL1 40, CO 3.27, CI 1.65; b. cath 06/09/15: LM minor irregs, m-dLAD 50%, dLAD 40%, pLCx 80% s/p PCI/DES 0%, pRCA 40%, dRCA 30%, 1st RPLB 40%  . Chronic systolic CHF (congestive heart failure) ([facility])    a. 02/2015 Echo: EF 20-25%, diff HK, mod MR, mildy dil LA, mildly dil RV w/ mod RV syst dysfxn, mildly dil RA, mod TR, PASP 75mmHg; b. 09/2017 Echo: EF 40-45%, diff HK. Gr1 DD. Mild to mod MR. Mildly to mod dil LA.  . Colon cancer ([facility])    a. 2015 s/p colectomy followed by chemoRx with oxaliplatin & fluorouacil.  . Dia hypertension 07/20/2014  . Diabetes mellitus without complication ([facility])   .  Hypertension   . Hypertensive heart disease   . Mixed Ischemic and Non-ischemic Cardiomyopathy    a. 02/2015 Echo: EF 20-25%, diff HK; b. 09/2016 Echo: EF 40-45%.  . Moderate mitral regurgitation    a. 09/2017 Echo: Mild to mod TR.  . Tubular adenoma of colon     Surgical History: Past Surgical History:  Procedure Laterality Date  . CARDIAC CATHETERIZATION Bilateral 02/24/2015   Procedure: Right/Left Heart Cath and Coronary Angiography;  Surgeon: Wellington Hampshire, MD;  Location: [facility];  Service: Cardiovascular;  Laterality: Bilateral;  . CARDIAC CATHETERIZATION N/A 06/09/2015   Procedure: Coronary Stent Intervention;  Surgeon: Wellington Hampshire, MD;  Location: [facility];  Service: Cardiovascular;  Laterality: N/A;  . COLON SURGERY     Colectomy  . COLONOSCOPY    . COLONOSCOPY WITH PROPOFOL N/A 12/11/2016   Procedure: COLONOSCOPY WITH PROPOFOL;  Surgeon: Lollie Sails, MD;  Location: [facility];  Service: Endoscopy;  Laterality: N/A;  . ESOPHAGOGASTRODUODENOSCOPY    . PORT-A-CATH REMOVAL N/A 07/12/2014   Procedure: REMOVAL PORT-A-CATH;  Surgeon: Molly Maduro, MD;  Location: [facility];  Service: General;  Laterality: N/A;  . PORTACATH PLACEMENT Left     Home Medications:  Allergies as of 08/18/2019      Reactions   Shellfish Allergy Other (See Comments)   Pt. instructed by MD to avoid seafood      Medication List       Accurate as of August 18, 2019 11:48 PM. If you have any questions, ask your nurse or doctor.        aspirin EC 81 MG tablet Take 1 tablet (81 mg total) by mouth daily.   atorvastatin 40 MG tablet Commonly known as: LIPITOR Take 1 tablet (40 mg total) by mouth daily.   carvedilol 3.125 MG tablet Commonly known as: COREG Take 1 tablet (3.125 mg total) by mouth 2 (two) times daily.   Entresto 24-26 MG Generic drug: sacubitril-valsartan TAKE 1 TABLET BY MOUTH TWICE DAILY   furosemide 40 MG tablet Commonly known as:  LASIX Take 1 tablet (40 mg total) on M,W,F, then take 0.5 tablet (20 mg total) on other days.   gabapentin 100 MG capsule Commonly known as: NEURONTIN Take 2 capsules (200 mg total) by mouth 3 (three) times daily.   metFORMIN 500 MG tablet Commonly known as: GLUCOPHAGE TAKE 1 TABLET(500 MG) BY MOUTH TWICE DAILY WITH A MEAL   spironolactone 25 MG tablet Commonly known as: ALDACTONE Take 1 tablet (25 mg total) by mouth daily.   tamsulosin 0.4 MG Caps capsule Commonly known as: FLOMAX TAKE 1 CAPSULE(0.4 MG) BY MOUTH DAILY AFTER SUPPER       Allergies:  Allergies  Allergen Reactions  . Shellfish  Allergy Other (See Comments)    Pt. instructed by MD to avoid seafood    Family History: Family History  Problem Relation Age of Onset  . Cancer Mother     Social History:  reports that he has never smoked. He has never used smokeless tobacco. He reports that he does not drink alcohol and does not use drugs.  ROS: For pertinent review of systems please refer to history of present illness  Physical Exam: BP (!) 156/73 (BP Location: Left Arm, Patient Position: Sitting, Cuff Size: Large)   Pulse 74   Ht 5\' 9"  (1.753 m)   Wt 183 lb 1.6 oz (83.1 kg)   BMI 27.04 kg/m   Constitutional:  Well nourished. Alert and oriented, No acute distress. HEENT:  AT, mask in place.  Trachea midline Cardiovascular: No clubbing, cyanosis, or edema. Respiratory: Normal respiratory effort, no increased work of breathing. GI: Abdomen is soft, non tender, non distended, no abdominal masses. Liver and spleen not palpable.  No hernias appreciated.  Stool sample for occult testing is not indicated.   GU: No CVA tenderness.  No bladder fullness or masses.  Patient with uncircumcised phallus.  Foreskin easily retracted.  Vitiligo is present.    Urethral meatus is patent.  No penile discharge. No penile lesions or rashes. Scrotum without lesions, cysts, rashes and/or edema.  Testicles are located scrotally  bilaterally. No masses are appreciated in the testicles. Left and right epididymis are normal. He also has bilateral hydroceles  Rectal: Patient with  normal sphincter tone. Anus and perineum without scarring or rashes. No rectal masses are appreciated. Prostate is approximately 45 grams, semical vesicles could not be palpated, 5 mm x 5 mm nodule was appreciated on today's exam.   Skin: No rashes, bruises or suspicious lesions. Lymph: No inguinal adenopathy. Neurologic: Grossly intact, no focal deficits, moving all 4 extremities. Psychiatric: Normal mood and affect.  Laboratory Data: Lab Results  Component Value Date   WBC 3.0 (L) 02/17/2019   HGB 11.6 (L) 02/17/2019   HCT 34.7 (L) 02/17/2019   MCV 98.3 02/17/2019   PLT 104 (L) 02/17/2019    Urinalysis Component     Latest Ref Rng & Units 08/18/2019  Specific Gravity, UA     1.005 - 1.030 1.015  pH, UA     5.0 - 7.5 5.0  Color, UA     Yellow Yellow  Appearance Ur     Clear Clear  Leukocytes,UA     Negative Negative  Protein,UA     Negative/Trace 1+ (A)  Glucose, UA     Negative Negative  Ketones, UA     Negative Negative  RBC, UA     Negative 1+ (A)  Bilirubin, UA     Negative Negative  Urobilinogen, Ur     0.2 - 1.0 mg/dL 0.2  Nitrite, UA     Negative Negative  Microscopic Examination      See below:   Component     Latest Ref Rng & Units 08/18/2019  WBC, UA     0 - 5 /hpf 0-5  RBC     4.22 - 5.81 MIL/uL 0-2  Epithelial Cells (non renal)     0 - 10 /hpf 0-10  Casts     None seen /lpf Present (A)  Cast Type     N/A Hyaline casts  Bacteria, UA     None seen/Few None seen    I have reviewed the labs.  Pertinent Imaging: Results for  Christopher, [name] (MRN [id number]) as of 08/18/2019 23:45  Ref. Range 08/18/2019 15:49  Scan Result Unknown 49mL    Assessment & Plan:    1. History of hematuria (high risk) Hematuria work up completed in 09/2017- findings positive for renal cysts and enlarged  prostate No report of gross hematuria  UA today is negative for hematuria RTC in one year for UA - patient to report any gross hematuria in the interim    2. BPH with LUTS Continue conservative management, avoiding bladder irritants and timed voiding's Continue tamsulosin 0.4 mg daily  IPSS  Is 6/1 which is slightly worse He will continue tamsulosin 0.4 mg daily  3. Prostate nodule Decreased in size Patient is not interested in further work up at this time  Return in about 1 year (around 08/17/2020) for I PSS, PVR, UA and exam .  These notes generated with voice recognition software. I apologize for typographical errors.  [facility]  [place], [place] [address]   [phone]  I, Joneen Boers Peace, am acting as a Education administrator for [employer].  I have reviewed the above documentation for accuracy and completeness, and I agree with the above.    Zara Council, PA-C

## 2019-08-19 ENCOUNTER — Inpatient Hospital Stay: Payer: PPO

## 2019-08-19 ENCOUNTER — Other Ambulatory Visit: Payer: Self-pay

## 2019-08-19 ENCOUNTER — Encounter: Payer: Self-pay | Admitting: Oncology

## 2019-08-19 ENCOUNTER — Inpatient Hospital Stay: Payer: PPO | Attending: Oncology

## 2019-08-19 ENCOUNTER — Inpatient Hospital Stay (HOSPITAL_BASED_OUTPATIENT_CLINIC_OR_DEPARTMENT_OTHER): Payer: PPO | Admitting: Oncology

## 2019-08-19 VITALS — BP 120/66 | HR 62 | Temp 96.1°F | Resp 16 | Wt 182.6 lb

## 2019-08-19 DIAGNOSIS — C187 Malignant neoplasm of sigmoid colon: Secondary | ICD-10-CM | POA: Diagnosis not present

## 2019-08-19 DIAGNOSIS — E119 Type 2 diabetes mellitus without complications: Secondary | ICD-10-CM | POA: Diagnosis not present

## 2019-08-19 DIAGNOSIS — Z85038 Personal history of other malignant neoplasm of large intestine: Secondary | ICD-10-CM | POA: Diagnosis not present

## 2019-08-19 DIAGNOSIS — E538 Deficiency of other specified B group vitamins: Secondary | ICD-10-CM

## 2019-08-19 DIAGNOSIS — R918 Other nonspecific abnormal finding of lung field: Secondary | ICD-10-CM | POA: Insufficient documentation

## 2019-08-19 DIAGNOSIS — Z9049 Acquired absence of other specified parts of digestive tract: Secondary | ICD-10-CM | POA: Diagnosis not present

## 2019-08-19 DIAGNOSIS — I2510 Atherosclerotic heart disease of native coronary artery without angina pectoris: Secondary | ICD-10-CM | POA: Insufficient documentation

## 2019-08-19 DIAGNOSIS — D61818 Other pancytopenia: Secondary | ICD-10-CM | POA: Insufficient documentation

## 2019-08-19 DIAGNOSIS — I110 Hypertensive heart disease with heart failure: Secondary | ICD-10-CM | POA: Insufficient documentation

## 2019-08-19 DIAGNOSIS — D696 Thrombocytopenia, unspecified: Secondary | ICD-10-CM

## 2019-08-19 DIAGNOSIS — Z9221 Personal history of antineoplastic chemotherapy: Secondary | ICD-10-CM | POA: Insufficient documentation

## 2019-08-19 DIAGNOSIS — Z7984 Long term (current) use of oral hypoglycemic drugs: Secondary | ICD-10-CM | POA: Insufficient documentation

## 2019-08-19 DIAGNOSIS — I509 Heart failure, unspecified: Secondary | ICD-10-CM | POA: Insufficient documentation

## 2019-08-19 DIAGNOSIS — Z7982 Long term (current) use of aspirin: Secondary | ICD-10-CM | POA: Diagnosis not present

## 2019-08-19 DIAGNOSIS — Z79899 Other long term (current) drug therapy: Secondary | ICD-10-CM | POA: Insufficient documentation

## 2019-08-19 LAB — COMPREHENSIVE METABOLIC PANEL
ALT: 11 U/L (ref 0–44)
AST: 15 U/L (ref 15–41)
Albumin: 4 g/dL (ref 3.5–5.0)
Alkaline Phosphatase: 74 U/L (ref 38–126)
Anion gap: 8 (ref 5–15)
BUN: 18 mg/dL (ref 8–23)
CO2: 25 mmol/L (ref 22–32)
Calcium: 8.8 mg/dL — ABNORMAL LOW (ref 8.9–10.3)
Chloride: 108 mmol/L (ref 98–111)
Creatinine, Ser: 1.49 mg/dL — ABNORMAL HIGH (ref 0.61–1.24)
GFR calc Af Amer: 54 mL/min — ABNORMAL LOW (ref >60)
GFR calc non Af Amer: 46 mL/min — ABNORMAL LOW (ref >60)
Glucose, Bld: 163 mg/dL — ABNORMAL HIGH (ref 70–99)
Potassium: 3.6 mmol/L (ref 3.5–5.1)
Sodium: 141 mmol/L (ref 135–145)
Total Bilirubin: 1.1 mg/dL (ref 0.3–1.2)
Total Protein: 7.2 g/dL (ref 6.5–8.1)

## 2019-08-19 LAB — MICROSCOPIC EXAMINATION: Bacteria, UA: NONE SEEN

## 2019-08-19 LAB — CBC WITH DIFFERENTIAL/PLATELET
Abs Immature Granulocytes: 0 10*3/uL (ref 0.00–0.07)
Basophils Absolute: 0 10*3/uL (ref 0.0–0.1)
Basophils Relative: 1 %
Eosinophils Absolute: 0.1 10*3/uL (ref 0.0–0.5)
Eosinophils Relative: 3 %
HCT: 36.1 % — ABNORMAL LOW (ref 39.0–52.0)
Hemoglobin: 12.2 g/dL — ABNORMAL LOW (ref 13.0–17.0)
Immature Granulocytes: 0 %
Lymphocytes Relative: 21 %
Lymphs Abs: 0.7 10*3/uL (ref 0.7–4.0)
MCH: 31.4 pg (ref 26.0–34.0)
MCHC: 33.8 g/dL (ref 30.0–36.0)
MCV: 92.8 fL (ref 80.0–100.0)
Monocytes Absolute: 0.5 10*3/uL (ref 0.1–1.0)
Monocytes Relative: 14 %
Neutro Abs: 1.9 10*3/uL (ref 1.7–7.7)
Neutrophils Relative %: 61 %
Platelets: 132 10*3/uL — ABNORMAL LOW (ref 150–400)
RBC: 3.89 MIL/uL — ABNORMAL LOW (ref 4.22–5.81)
RDW: 14.2 % (ref 11.5–15.5)
WBC: 3.2 10*3/uL — ABNORMAL LOW (ref 4.0–10.5)
nRBC: 0 % (ref 0.0–0.2)

## 2019-08-19 LAB — URINALYSIS, COMPLETE
Bilirubin, UA: NEGATIVE
Glucose, UA: NEGATIVE
Ketones, UA: NEGATIVE
Leukocytes,UA: NEGATIVE
Nitrite, UA: NEGATIVE
Specific Gravity, UA: 1.015 (ref 1.005–1.030)
Urobilinogen, Ur: 0.2 mg/dL (ref 0.2–1.0)
pH, UA: 5 (ref 5.0–7.5)

## 2019-08-19 LAB — VITAMIN B12: Vitamin B-12: 314 pg/mL (ref 180–914)

## 2019-08-19 MED ORDER — CYANOCOBALAMIN 1000 MCG/ML IJ SOLN
1000.0000 ug | INTRAMUSCULAR | Status: DC
  Administered 2019-08-19: 1000 ug via INTRAMUSCULAR
  Filled 2019-08-19: qty 1

## 2019-08-19 NOTE — Progress Notes (Signed)
[place]  Chief Complaint: [name]. is a 74 y.o. male with a history of stage IIIC colon cancer, pancytopenia, and B12 deficiency who presented to follow-up for management of history of colon cancer and vitamin B12 deficiency.   PERTINENT ONCOLOGY HISTORY Patient previous oncology care was with Dr. Mike Gip who has moved practice to [facility] Patient switches care to me on 02/10/2018  # stage IIIC colon cancer.  He presented with symptomatic anemia.  He underwent descending and proximal sigmoid colectomy, left ureteral lysis, and partial cecectomy on 06/23/2013. Six of 22 lymph nodes were positive.  There were tumor deposits (discontinuous extramural extension).  There was lymphovascular and perineural invasion. Pathologic stage was IIIC (T4bN2a M0). He received FOLFOX chemotherapy from 07/22/2013 until 01/06/2014.    Chest, abdomen, and pelvic CT on 01/12/2016 revealed no evidence of recurrent or metastatic carcinoma.  Abdomen and pelvic CT with and without contrast on 09/13/2017 revealed no multiple simple renal cysts.  Liver was unremarkable.  Colonoscopy on 04/12/2014 revealed several polyps.  There was no dysplasia or malignancy.  Colonoscopy on 12/11/2016 revealed 10 polyps (2-8 mm) in the ascending proximal transverse, descending, proximal sigmoid and rectum.  Pathology revealed multiple tubular adenomas without dysplasia or malignancy.  He was diagnosed with pyelonephritis.  He is undergoing evaluation for hematuria.  CT scan was negative.  Cystoscopy on 10/03/2017 revealed prostate enlargement and no evidence of bladder pathology.  # He has a mild pancytopenia.  He received a course of Septra (08/26/2017 - 09/02/2017).  He has a history of a mild normocytic anemia.  Diet is modest.  He denies herbal products.  He denies any melena or hematochezia.  Work-up on 09/27/2017 revealed a hematocrit 31.1, hemoglobin 10.6, MCV 94, platelets 141,000, WBC  2700 with an ANC of 1600.  Creatinine was 1.32.  He has B12 deficiency.  B12 was low (149).  Anti-parietal cell antibody and intrinsic factor antibody were negative.   Negative studies included:  folate (12.3), TSH, hepatitis B core antibody total, and hepatitis C antibody. Retic was 1.8%.  Ferritin was 103 with a iron saturation of 25% and a TIBC of 201.   Creatinine was 1.32.  He has B12 deficiency.  B12 was 149 on 09/27/2017 then 322 on 11/04/2017 and 227 on 12/05/2017 on oral B12.  Echocardiogram revealed an EF was 20-25% on 02/18/2015.  Cardiac catheterization on 02/24/2015 revealed 80% occlusion of the proximal left circumflex.  He managed medically.   INTERVAL HISTORY [name]. is a 74 y.o. male who has above history reviewed by me today presents for follow up visit for management of history of colon cancer and vitamin B12 deficiency Problems and complaints are listed below: Patient has no new complaints today.   Denies any unintentional weight loss, abdominal pain, bloody or black tarry stool. History of vitamin B12 deficiency, patient has been on parenteral vitamin B12 injection monthly. Appetite is good.   Past Medical History:  Diagnosis Date   Acute renal failure (ARF) ([place]) 08/14/2017   Anemia    CAD (coronary artery disease)    a. 02/2015 Cath: LM nl, LAD 50/40 mid/distal, LCX 80p, RCA 40p, 30d, RPL1 40, CO 3.27, CI 1.65; b. cath 06/09/15: LM minor irregs, m-dLAD 50%, dLAD 40%, pLCx 80% s/p PCI/DES 0%, pRCA 40%, dRCA 30%, 1st RPLB 63%   Chronic systolic CHF (congestive heart failure) ([place])    a. 02/2015 Echo: EF 20-25%, diff HK, mod MR, mildy dil  LA, mildly dil RV w/ mod RV syst dysfxn, mildly dil RA, mod TR, PASP 26mHg; b. 09/2017 Echo: EF 40-45%, diff HK. Gr1 DD. Mild to mod MR. Mildly to mod dil LA.   [place]    a. 2015 s/p colectomy followed by chemoRx with oxaliplatin & fluorouacil.   Dia hypertension 07/20/2014   Di[place]  complication [place]    Hypertension    Hypertensive heart disease    Mixed Ischemic and Non-ischemic Cardiomyopathy    a. 02/2015 Echo: EF 20-25%, diff HK; b. 09/2016 Echo: EF 40-45%.   Moderate mitral regurgitation    a. 09/2017 Echo: Mild to mod TR.   Tubular adenoma of colon     Past Surgical History:  Procedure Laterality Date   CARDIAC CATHETERIZATION Bilateral 02/24/2015   Procedure: Right/Left Heart Cath and Coronary Angiography;  Surgeon: MWellington Hampshire MD;  [place] [facility];  Service: Cardiovascular;  Laterality: Bilateral;   CARDIAC CATHETERIZATION N/A 06/09/2015   Procedure: Coronary Stent Intervention;  Surgeon: MWellington Hampshire MD;  [facility];  Service: Cardiovascular;  Laterality: N/A;   COLON SURGERY     Colectomy   COLONOSCOPY     COLONOSCOPY WITH PROPOFOL N/A 12/11/2016   Procedure: COLONOSCOPY WITH PROPOFOL;  Surgeon: SLollie Sails MD;  [facility];  Service: Endoscopy;  Laterality: N/A;   ESOPHAGOGASTRODUODENOSCOPY     PORT-A-CATH REMOVAL N/A 07/12/2014   Procedure: REMOVAL PORT-A-CATH;  Surgeon: WMolly Maduro MD;  Location: [facility];  Service: General;  Laterality: N/A;   PORTACATH PLACEMENT Left    Family History  Problem Relation Age of Onset   Cancer Mother    Social History   Socioeconomic History   Marital status: Married    Spouse name: Not on file   Number of children: Not on file   Years of education: Not on file   Highest education level: Not on file  Occupational History   Not on file  Tobacco Use   Smoking status: Never Smoker   Smokeless tobacco: Never Used  Vaping Use   Vaping Use: Never used  Substance and Sexual Activity   Alcohol use: No   Drug use: No   Sexual activity: Yes  Other Topics Concern   Not on file  Social History Narrative   Not on file   Social Determinants of Health   Financial Resource Strain:    Difficulty of Paying Living  Expenses:   Food Insecurity:    Worried About RCharity fundraiserin the Last Year:    RArboriculturistin the Last Year:   Transportation Needs:    LFilm/video editor(Medical):    Lack of Transportation (Non-Medical):   Physical Activity:    Days of Exercise per Week:    Minutes of Exercise per Session:   Stress:    Feeling of Stress :   Social Connections:    Frequency of Communication with Friends and Family:    Frequency of Social Gatherings with Friends and Family:    Attends Religious Services:    Active Member of Clubs or Organizations:    Attends CArchivistMeetings:    Marital Status:   Intimate Partner Violence:    Fear of Current or Ex-Partner:    Emotionally Abused:    Physically Abused:    Sexually Abused:     Allergies:  Allergies  Allergen Reactions   Shellfish Allergy Other (See Comments)  Pt. instructed by MD to avoid seafood    Current Medications: Current Outpatient Medications  Medication Sig Dispense Refill   aspirin EC 81 MG tablet Take 1 tablet (81 mg total) by mouth daily. 90 tablet 3   atorvastatin (LIPITOR) 40 MG tablet Take 1 tablet (40 mg total) by mouth daily. 90 tablet 0   carvedilol (COREG) 3.125 MG tablet Take 1 tablet (3.125 mg total) by mouth 2 (two) times daily. 180 tablet 3   ENTRESTO 24-26 MG TAKE 1 TABLET BY MOUTH TWICE DAILY 60 tablet 6   furosemide (LASIX) 40 MG tablet Take 1 tablet (40 mg total) on M,W,F, then take 0.5 tablet (20 mg total) on other days. 90 tablet 3   gabapentin (NEURONTIN) 100 MG capsule Take 2 capsules (200 mg total) by mouth 3 (three) times daily. 180 capsule 0   metFORMIN (GLUCOPHAGE) 500 MG tablet TAKE 1 TABLET(500 MG) BY MOUTH TWICE DAILY WITH A MEAL 180 tablet 3   spironolactone (ALDACTONE) 25 MG tablet Take 1 tablet (25 mg total) by mouth daily. 90 tablet 1   tamsulosin (FLOMAX) 0.4 MG CAPS capsule TAKE 1 CAPSULE(0.4 MG) BY MOUTH DAILY AFTER SUPPER 30 capsule 11    No current facility-administered medications for this visit.    Review of Systems  Constitutional: Negative for chills, fever, malaise/fatigue and weight loss.  HENT: Negative for sore throat.   Eyes: Negative for redness.  Respiratory: Negative for cough, shortness of breath and wheezing.   Cardiovascular: Negative for chest pain, palpitations and leg swelling.  Gastrointestinal: Negative for abdominal pain, blood in stool, nausea and vomiting.  Genitourinary: Negative for dysuria.  Musculoskeletal: Negative for myalgias.  Skin: Negative for rash.  Neurological: Negative for dizziness, tingling and tremors.  Endo/Heme/Allergies: Does not bruise/bleed easily.  Psychiatric/Behavioral: Negative for hallucinations.   Performance status (ECOG): 0  Vital Signs: BP 120/66    Pulse 62    Temp (!) 96.1 F (35.6 C)    Resp 16    Wt 182 lb 9.6 oz (82.8 kg)    BMI 26.97 kg/m   Physical Exam Constitutional:      General: He is not in acute distress.    Appearance: He is not diaphoretic.  HENT:     Head: Normocephalic and atraumatic.     Nose: Nose normal.     Mouth/Throat:     Pharynx: No oropharyngeal exudate.  Eyes:     General: No scleral icterus.    Pupils: Pupils are equal, round, and reactive to light.  Cardiovascular:     Rate and Rhythm: Normal rate and regular rhythm.     Heart sounds: No murmur heard.   Pulmonary:     Effort: Pulmonary effort is normal. No respiratory distress.     Breath sounds: No rales.  Chest:     Chest wall: No tenderness.  Abdominal:     General: There is no distension.     Palpations: Abdomen is soft.     Tenderness: There is no abdominal tenderness.  Musculoskeletal:        General: Normal range of motion.     Cervical back: Normal range of motion and neck supple.     Comments: Chronic lower extremity edema +1  Skin:    General: Skin is warm and dry.     Findings: No erythema.     Comments: Chronic dermatological changes due to vein  insufficiency  Neurological:     Mental Status: He is alert and oriented to  person, place, and time.     Cranial Nerves: No cranial nerve deficit.     Motor: No abnormal muscle tone.     Coordination: Coordination normal.  Psychiatric:        Mood and Affect: Affect normal.        Judgment: Judgment normal.    RADIOGRAPHIC STUDIES: I have personally reviewed the radiological images as listed and agreed with the findings in the report. CT Abdomen Pelvis Wo Contrast  Result Date: 08/17/2019 CLINICAL DATA:  74 year old male with history of colon cancer treated with chemotherapy. Follow-up evaluation. EXAM: CT CHEST, ABDOMEN AND PELVIS WITHOUT CONTRAST TECHNIQUE: Multidetector CT imaging of the chest, abdomen and pelvis was performed following the standard protocol without IV contrast. COMPARISON:  CT the chest, abdomen and pelvis 08/07/2018. FINDINGS: CT CHEST FINDINGS Cardiovascular: Heart size is mildly enlarged. There is no significant pericardial fluid, thickening or pericardial calcification. There is aortic atherosclerosis, as well as atherosclerosis of the great vessels of the mediastinum and the coronary arteries, including calcified atherosclerotic plaque in the left main, left anterior descending, left circumflex and right coronary arteries. Mild calcifications of the aortic valve. Mediastinum/Nodes: No pathologically enlarged mediastinal or hilar lymph nodes. Please note that accurate exclusion of hilar adenopathy is limited on noncontrast CT scans. Esophagus is unremarkable in appearance. No axillary lymphadenopathy. Lungs/Pleura: 5 mm right upper lobe pulmonary nodule (axial image 46 of series 4). 2 mm left upper lobe pulmonary nodule (axial image 25 of series 4). No other larger more suspicious appearing pulmonary nodules or masses are noted. No acute consolidative airspace disease. No pleural effusions. Musculoskeletal: There are no aggressive appearing lytic or blastic lesions noted in  the visualized portions of the skeleton. CT ABDOMEN PELVIS FINDINGS Hepatobiliary: No definite suspicious cystic or solid hepatic lesions are confidently identified on today's noncontrast CT examination. Unenhanced appearance of the gallbladder is normal. Pancreas: No definite pancreatic mass or peripancreatic fluid collections or inflammatory changes are noted on today's noncontrast CT examination. Spleen: Unremarkable. Adrenals/Urinary Tract: Multiple low-attenuation lesions in both kidneys, similar to the prior study but incompletely characterized on today's non-contrast CT examination, but statistically likely to represent cysts, largest of which measures 3.6 cm in the interpolar region of the right kidney. Bilateral adrenal glands are normal in appearance. No hydroureteronephrosis. Urinary bladder is nearly decompressed, but otherwise unremarkable in appearance. Stomach/Bowel: Unenhanced appearance of the stomach is normal. No pathologic dilatation of small bowel or colon. Status post appendectomy. Vascular/Lymphatic: Aortic atherosclerosis. No lymphadenopathy noted in the abdomen or pelvis. Reproductive: Prostate gland and seminal vesicles are unremarkable in appearance. Bilateral hydroceles incompletely imaged. Other: No significant volume of ascites.  No pneumoperitoneum. Musculoskeletal: There are no aggressive appearing lytic or blastic lesions noted in the visualized portions of the skeleton. IMPRESSION: 1. No definitive evidence to suggest metastatic disease in the chest, abdomen or pelvis. 2. There are small pulmonary nodules in the lungs measuring 5 mm or less in size, as above, nonspecific but statistically likely benign. Attention at time of routine follow-up imaging is recommended to ensure stability or resolution. 3. Aortic atherosclerosis, in addition to left main and 3 vessel coronary artery disease. Assessment for potential risk factor modification, dietary therapy or pharmacologic therapy may  be warranted, if clinically indicated. 4. There are calcifications of the aortic valve. Echocardiographic correlation for evaluation of potential valvular dysfunction may be warranted if clinically indicated. 5. Mild cardiomegaly. Electronically Signed   By: Vinnie Langton M.D.   On: 08/17/2019 10:47  CT Chest Wo Contrast  Result Date: 08/17/2019 CLINICAL DATA:  74 year old male with history of colon cancer treated with chemotherapy. Follow-up evaluation. EXAM: CT CHEST, ABDOMEN AND PELVIS WITHOUT CONTRAST TECHNIQUE: Multidetector CT imaging of the chest, abdomen and pelvis was performed following the standard protocol without IV contrast. COMPARISON:  CT the chest, abdomen and pelvis 08/07/2018. FINDINGS: CT CHEST FINDINGS Cardiovascular: Heart size is mildly enlarged. There is no significant pericardial fluid, thickening or pericardial calcification. There is aortic atherosclerosis, as well as atherosclerosis of the great vessels of the mediastinum and the coronary arteries, including calcified atherosclerotic plaque in the left main, left anterior descending, left circumflex and right coronary arteries. Mild calcifications of the aortic valve. Mediastinum/Nodes: No pathologically enlarged mediastinal or hilar lymph nodes. Please note that accurate exclusion of hilar adenopathy is limited on noncontrast CT scans. Esophagus is unremarkable in appearance. No axillary lymphadenopathy. Lungs/Pleura: 5 mm right upper lobe pulmonary nodule (axial image 46 of series 4). 2 mm left upper lobe pulmonary nodule (axial image 25 of series 4). No other larger more suspicious appearing pulmonary nodules or masses are noted. No acute consolidative airspace disease. No pleural effusions. Musculoskeletal: There are no aggressive appearing lytic or blastic lesions noted in the visualized portions of the skeleton. CT ABDOMEN PELVIS FINDINGS Hepatobiliary: No definite suspicious cystic or solid hepatic lesions are confidently  identified on today's noncontrast CT examination. Unenhanced appearance of the gallbladder is normal. Pancreas: No definite pancreatic mass or peripancreatic fluid collections or inflammatory changes are noted on today's noncontrast CT examination. Spleen: Unremarkable. Adrenals/Urinary Tract: Multiple low-attenuation lesions in both kidneys, similar to the prior study but incompletely characterized on today's non-contrast CT examination, but statistically likely to represent cysts, largest of which measures 3.6 cm in the interpolar region of the right kidney. Bilateral adrenal glands are normal in appearance. No hydroureteronephrosis. Urinary bladder is nearly decompressed, but otherwise unremarkable in appearance. Stomach/Bowel: Unenhanced appearance of the stomach is normal. No pathologic dilatation of small bowel or colon. Status post appendectomy. Vascular/Lymphatic: Aortic atherosclerosis. No lymphadenopathy noted in the abdomen or pelvis. Reproductive: Prostate gland and seminal vesicles are unremarkable in appearance. Bilateral hydroceles incompletely imaged. Other: No significant volume of ascites.  No pneumoperitoneum. Musculoskeletal: There are no aggressive appearing lytic or blastic lesions noted in the visualized portions of the skeleton. IMPRESSION: 1. No definitive evidence to suggest metastatic disease in the chest, abdomen or pelvis. 2. There are small pulmonary nodules in the lungs measuring 5 mm or less in size, as above, nonspecific but statistically likely benign. Attention at time of routine follow-up imaging is recommended to ensure stability or resolution. 3. Aortic atherosclerosis, in addition to left main and 3 vessel coronary artery disease. Assessment for potential risk factor modification, dietary therapy or pharmacologic therapy may be warranted, if clinically indicated. 4. There are calcifications of the aortic valve. Echocardiographic correlation for evaluation of potential valvular  dysfunction may be warranted if clinically indicated. 5. Mild cardiomegaly. Electronically Signed   By: Vinnie Langton M.D.   On: 08/17/2019 10:47     Labs CBC Latest Ref Rng & Units 02/17/2019 08/11/2018 05/07/2018  WBC 4.0 - 10.5 K/uL 3.0(L) 2.9(L) 2.8(L)  Hemoglobin 13.0 - 17.0 g/dL 11.6(L) 11.3(L) 11.4(L)  Hematocrit 39 - 52 % 34.7(L) 34.8(L) 35.3(L)  Platelets 150 - 400 K/uL 104(L) 131(L) 136(L)   CMP Latest Ref Rng & Units 08/11/2019 02/17/2019 12/23/2018  Glucose 65 - 99 mg/dL 116(H) 122(H) 159(H)  BUN 8 - 27 mg/dL 13 21 20  Creatinine 0.76 - 1.27 mg/dL 1.45(H) 1.28(H) 1.39(H)  Sodium 134 - 144 mmol/L 145(H) 143 143  Potassium 3.5 - 5.2 mmol/L 3.8 4.0 3.9  Chloride 96 - 106 mmol/L 106 107 107  CO2 20 - 29 mmol/L _0 Calcium 8.6 - 10.2 mg/dL 9.0 9.1 9.3  Total Protein 6.5 - 8.1 g/dL - 7.0 -  Total Bilirubin 0.3 - 1.2 mg/dL - 0.9 -  Alkaline Phos 38 - 126 U/L - 66 -  AST 15 - 41 U/L - 15 -  ALT 0 - 44 U/L - 11 -   CEA has been followed:  4.3 on 06/20/2013, 1.8 on 01/06/2014, 1.5 on 09/16/2014, 1.1 on 01/14/2015, 1.5 on 05/16/2015, 1.6 on 09/12/2015, 1.3 on 01/20/2016, 1.6 on 08/24/2016, 1.2 on 01/09/2017, and 1.1 on 09/27/2017.  Assessment:  Christopher Bischoff. is a 74 y.o. male follows up for management of pancytopenia, vitamin B12 deficiency and stage IIIc colon cancer. 1. Malignant neoplasm of sigmoid colon ([place])   2. B12 deficiency   3. Thrombocytopenia ([place])    # Stage IIIC colon cancer:  Clinically patient is doing well.  CEA has been followed.  Today's level is pending. Continue follow-up with gastroenterology for colon polyp surveillance. CTs were independently viewed by me and discussed with patient No definitive evidence to suggest metastatic disease in the chest abdomen pelvis.  There are small lung nodules in the lungs measuring 5 mm or less, nonspecific.  Other chronic findings including atherosclerosis, aortic valve calcifications, cardiomegaly, discussed with  patient.  He is aware about these findings. Recommend to repeat CT chest in 6 months.  #Anemia/thrombocytopenia/leukopenia, Chronic problem for him.  Counts are stable.  Continue to monitor. Discussed with patient and recommend continue observation. If his count is progressively worsens, will obtain bone marrow biopsy for further evaluation.  #History of vitamin B12 deficiency, today's vitamin B12 level is pending.  Proceed with vitamin B12 injection today. Future vitamin B12 injections pending today's level.  Orders Placed This Encounter  Procedures   CT CHEST WO CONTRAST    Standing Status:   Future    Standing Expiration Date:   08/18/2020    Order Specific Question:   ** REASON FOR EXAM (FREE TEXT)    Answer:   f/u on lung nodule    Order Specific Question:   Preferred imaging location?    Answer:   [facility]    Order Specific Question:   Radiology Contrast Protocol - do NOT remove file path    Answer:   [email or web address]   CBC with Differential/Platelet    Standing Status:   Future    Standing Expiration Date:   08/18/2020   Comprehensive metabolic panel    Standing Status:   Future    Standing Expiration Date:   08/18/2020   Vitamin B12    Standing Status:   Future    Standing Expiration Date:   08/18/2020  = RTC  monthly Vitamin B12 injections, 6 months for follow-up assessment.  Check CBC, CMP, CEA, vitamin B12   Earlie Server, MD, PhD Hematology Oncology [facility] Pager- [phone] 08/19/2019

## 2019-08-19 NOTE — Progress Notes (Signed)
Patient denies new problems/concerns today.   °

## 2019-08-20 ENCOUNTER — Telehealth: Payer: Self-pay

## 2019-08-20 LAB — CEA: CEA: 2 ng/mL (ref 0.0–4.7)

## 2019-08-20 NOTE — Telephone Encounter (Signed)
Done Pt has been sched for Monthly B-12 Injections x 11 as requested. I was unablet  to reach him by phone. A NEW appt reminder letter will be mailed out making him aware of his sched appts

## 2019-08-20 NOTE — Telephone Encounter (Signed)
-----   Message from Earlie Server, MD sent at 08/19/2019  9:28 PM EDT ----- Please arrange him to do monthly b12 x 11 thanks.

## 2019-08-20 NOTE — Telephone Encounter (Signed)
Patient was aware yesterday that he may be getting monthly B12 injections scheduled.  Please schedule as MD recommends (received inj yesterday) and inform pt of appt details.  Thanks.

## 2019-08-25 ENCOUNTER — Other Ambulatory Visit: Payer: Self-pay | Admitting: Cardiovascular Disease

## 2019-09-09 ENCOUNTER — Other Ambulatory Visit: Payer: Self-pay

## 2019-09-09 ENCOUNTER — Ambulatory Visit (INDEPENDENT_AMBULATORY_CARE_PROVIDER_SITE_OTHER): Payer: PPO

## 2019-09-09 DIAGNOSIS — I5022 Chronic systolic (congestive) heart failure: Secondary | ICD-10-CM

## 2019-09-09 LAB — ECHOCARDIOGRAM COMPLETE
AR max vel: 2.58 cm2
AV Area VTI: 2.32 cm2
AV Area mean vel: 2.26 cm2
AV Mean grad: 2 mmHg
AV Peak grad: 3.9 mmHg
Ao pk vel: 0.99 m/s
Calc EF: 28.6 %
S' Lateral: 5.5 cm
Single Plane A2C EF: 26.7 %
Single Plane A4C EF: 30.8 %

## 2019-09-14 ENCOUNTER — Telehealth: Payer: Self-pay

## 2019-09-14 NOTE — Telephone Encounter (Signed)
-----   Message from Muhammad A Arida, MD sent at 09/11/2019  9:27 AM EDT ----- EF is still severely decreased at 25 to 30% which is stable from before. Recommend referral to Dr. Klein or Dr. Lambert for evaluation regarding ICD placement.  

## 2019-09-14 NOTE — Telephone Encounter (Signed)
Called to give the patient echo results and Dr. Arida's recommendation. [email or web address] 

## 2019-09-18 ENCOUNTER — Inpatient Hospital Stay: Payer: PPO | Attending: Oncology

## 2019-09-18 ENCOUNTER — Other Ambulatory Visit: Payer: Self-pay

## 2019-09-18 DIAGNOSIS — E538 Deficiency of other specified B group vitamins: Secondary | ICD-10-CM | POA: Diagnosis not present

## 2019-09-18 MED ORDER — CYANOCOBALAMIN 1000 MCG/ML IJ SOLN
1000.0000 ug | INTRAMUSCULAR | Status: DC
  Administered 2019-09-18: 1000 ug via INTRAMUSCULAR
  Filled 2019-09-18: qty 1

## 2019-09-21 NOTE — Telephone Encounter (Signed)
3rd attempt to contact the patient with result. Letter mailed to the patient to contact the our office for results.

## 2019-09-21 NOTE — Telephone Encounter (Signed)
-----   Message from Wellington Hampshire, MD sent at 09/11/2019  9:27 AM EDT ----- EF is still severely decreased at 25 to 30% which is stable from before. Recommend referral to Dr. Caryl Comes or Dr. Quentin Ore for evaluation regarding ICD placement.

## 2019-10-14 DIAGNOSIS — D631 Anemia in chronic kidney disease: Secondary | ICD-10-CM | POA: Diagnosis not present

## 2019-10-14 DIAGNOSIS — N1831 Chronic kidney disease, stage 3a: Secondary | ICD-10-CM | POA: Diagnosis not present

## 2019-10-14 DIAGNOSIS — E1122 Type 2 diabetes mellitus with diabetic chronic kidney disease: Secondary | ICD-10-CM | POA: Diagnosis not present

## 2019-10-14 DIAGNOSIS — I10 Essential (primary) hypertension: Secondary | ICD-10-CM | POA: Diagnosis not present

## 2019-10-16 ENCOUNTER — Inpatient Hospital Stay: Payer: PPO | Attending: Oncology

## 2019-10-16 ENCOUNTER — Other Ambulatory Visit: Payer: Self-pay

## 2019-10-16 DIAGNOSIS — E538 Deficiency of other specified B group vitamins: Secondary | ICD-10-CM | POA: Diagnosis not present

## 2019-10-16 MED ORDER — CYANOCOBALAMIN 1000 MCG/ML IJ SOLN
1000.0000 ug | INTRAMUSCULAR | Status: DC
  Administered 2019-10-16: 1000 ug via INTRAMUSCULAR
  Filled 2019-10-16: qty 1

## 2019-11-16 ENCOUNTER — Inpatient Hospital Stay: Payer: PPO | Attending: Oncology

## 2019-11-16 ENCOUNTER — Other Ambulatory Visit: Payer: Self-pay

## 2019-11-16 DIAGNOSIS — E538 Deficiency of other specified B group vitamins: Secondary | ICD-10-CM | POA: Insufficient documentation

## 2019-11-16 MED ORDER — CYANOCOBALAMIN 1000 MCG/ML IJ SOLN
1000.0000 ug | INTRAMUSCULAR | Status: DC
  Administered 2019-11-16: 1000 ug via INTRAMUSCULAR
  Filled 2019-11-16: qty 1

## 2019-12-14 ENCOUNTER — Inpatient Hospital Stay: Payer: PPO | Attending: Oncology

## 2019-12-14 ENCOUNTER — Other Ambulatory Visit: Payer: Self-pay

## 2019-12-14 DIAGNOSIS — E538 Deficiency of other specified B group vitamins: Secondary | ICD-10-CM | POA: Diagnosis not present

## 2019-12-14 MED ORDER — CYANOCOBALAMIN 1000 MCG/ML IJ SOLN
1000.0000 ug | INTRAMUSCULAR | Status: DC
  Administered 2019-12-14: 1000 ug via INTRAMUSCULAR
  Filled 2019-12-14: qty 1

## 2020-01-07 ENCOUNTER — Other Ambulatory Visit: Payer: Self-pay | Admitting: Cardiovascular Disease

## 2020-01-13 ENCOUNTER — Other Ambulatory Visit: Payer: Self-pay

## 2020-01-13 ENCOUNTER — Inpatient Hospital Stay: Payer: PPO | Attending: Oncology

## 2020-01-13 DIAGNOSIS — E538 Deficiency of other specified B group vitamins: Secondary | ICD-10-CM | POA: Insufficient documentation

## 2020-01-13 DIAGNOSIS — Z85038 Personal history of other malignant neoplasm of large intestine: Secondary | ICD-10-CM | POA: Insufficient documentation

## 2020-01-13 MED ORDER — CYANOCOBALAMIN 1000 MCG/ML IJ SOLN
1000.0000 ug | INTRAMUSCULAR | Status: DC
  Administered 2020-01-13: 1000 ug via INTRAMUSCULAR
  Filled 2020-01-13: qty 1

## 2020-02-10 ENCOUNTER — Inpatient Hospital Stay: Payer: PPO | Attending: Oncology

## 2020-02-10 DIAGNOSIS — E538 Deficiency of other specified B group vitamins: Secondary | ICD-10-CM | POA: Diagnosis not present

## 2020-02-10 DIAGNOSIS — Z85038 Personal history of other malignant neoplasm of large intestine: Secondary | ICD-10-CM | POA: Diagnosis not present

## 2020-02-10 MED ORDER — CYANOCOBALAMIN 1000 MCG/ML IJ SOLN
1000.0000 ug | INTRAMUSCULAR | Status: DC
  Administered 2020-02-10: 1000 ug via INTRAMUSCULAR
  Filled 2020-02-10: qty 1

## 2020-02-17 ENCOUNTER — Other Ambulatory Visit: Payer: Self-pay | Admitting: Physician Assistant

## 2020-02-19 ENCOUNTER — Ambulatory Visit: Payer: PPO | Attending: Oncology

## 2020-03-01 IMAGING — MR MR ABDOMEN W/O CM
6 of 7 series · 30 of 48 positions shown · non-contrast
Comparison: 08/15/2017

CLINICAL DATA: Weakness and abdominal cramping. Acute renal
failure. Colon cancer with prior colectomy. Coronary artery disease
with prior bypass. Left renal mass.

EXAM:
MRI ABDOMEN WITHOUT CONTRAST
TECHNIQUE: Multiplanar multisequence MR imaging was performed without the
administration of intravenous contrast. Contrast could not be
administered, the patient's GFR is 21.

[Series 4: T2 · coronal · 8.0mm · 0.78mm/px · 5 of 32 slices shown]
[im 1/32]
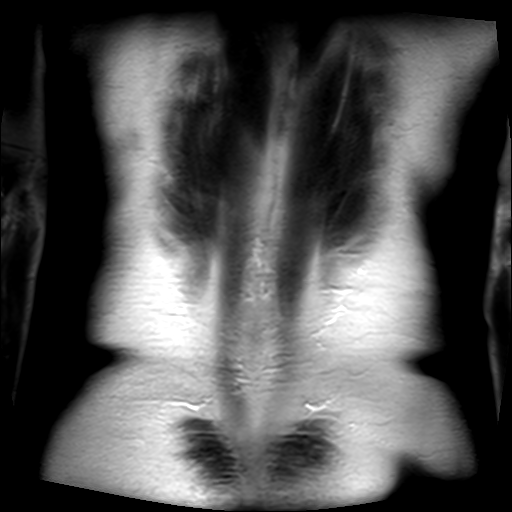
[im 8/32]
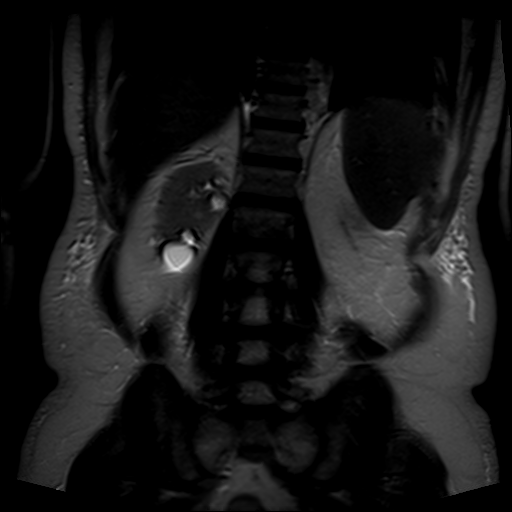
[im 16/32]
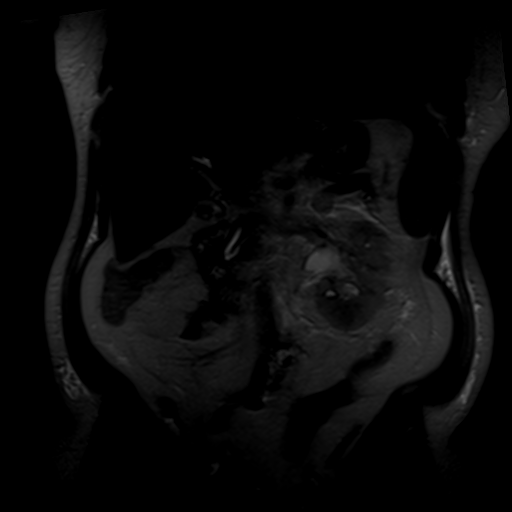
[im 24/32]
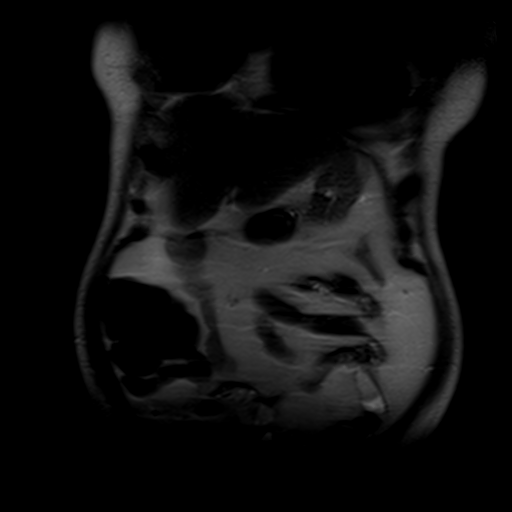
[im 32/32]
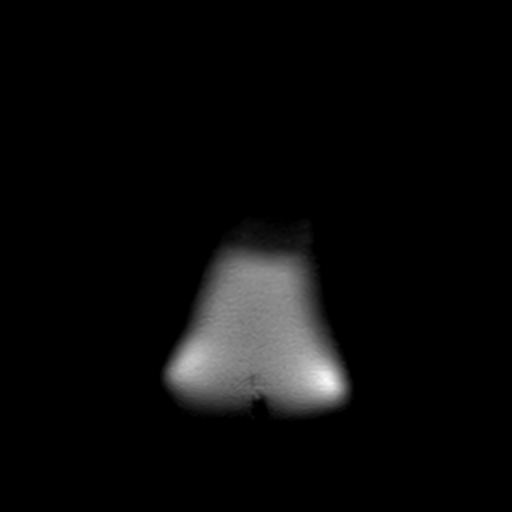

[Series 5: T2 fat-sat · axial · 7.0mm · 0.70mm/px · z∈[-83,+77]mm · 3 of 20 slices shown]
[im 1/20]
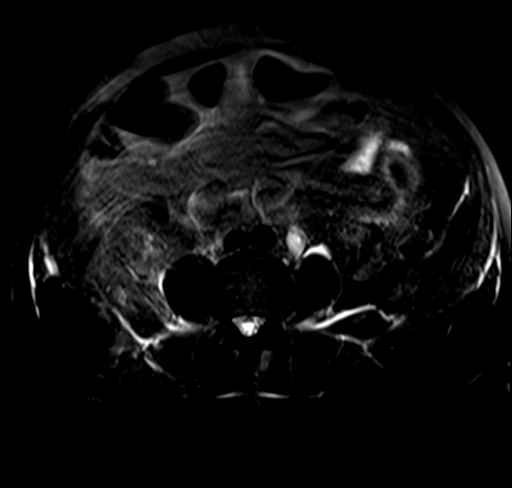
[im 10/20]
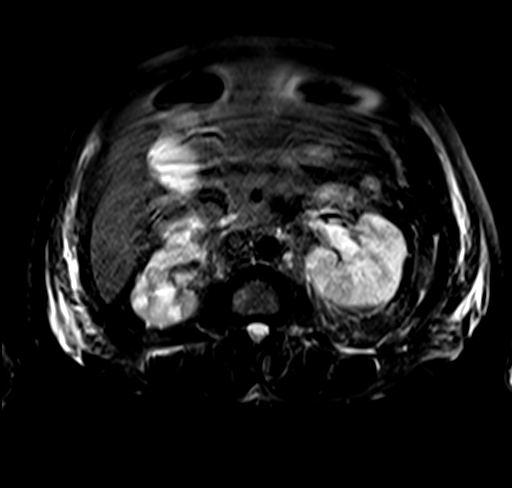
[im 20/20]
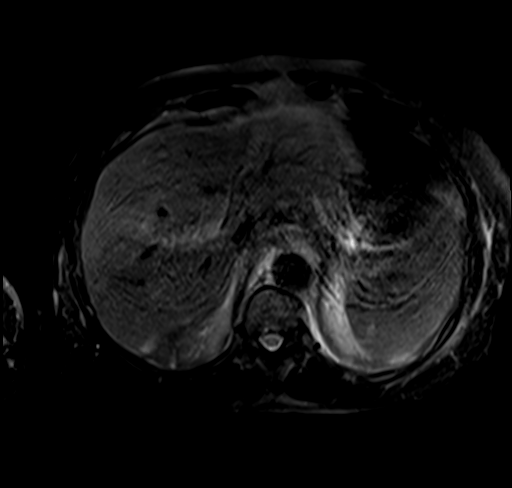

[Series 6: T1 · axial · 7.0mm · 0.74mm/px · z∈[-91,+85]mm · 6 of 44 slices shown]
[im 1/44]
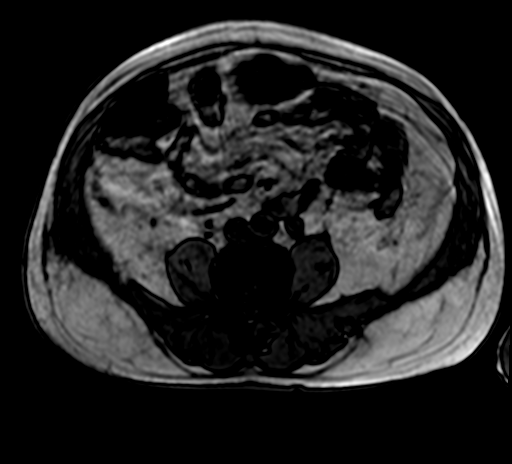
[im 9/44]
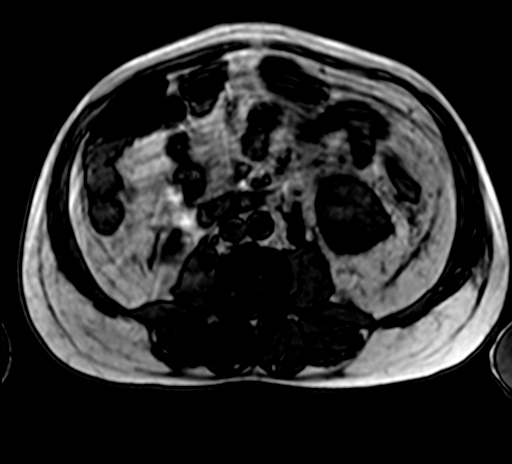
[im 18/44]
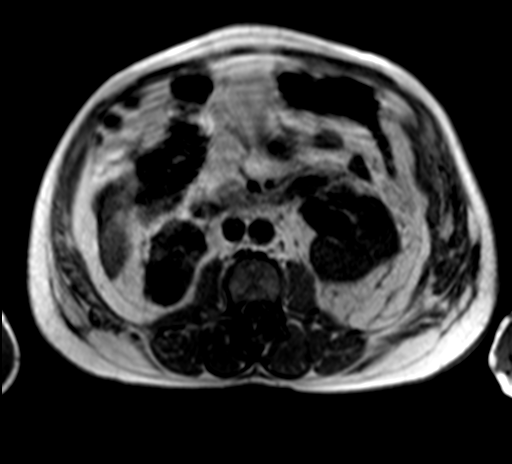
[im 26/44]
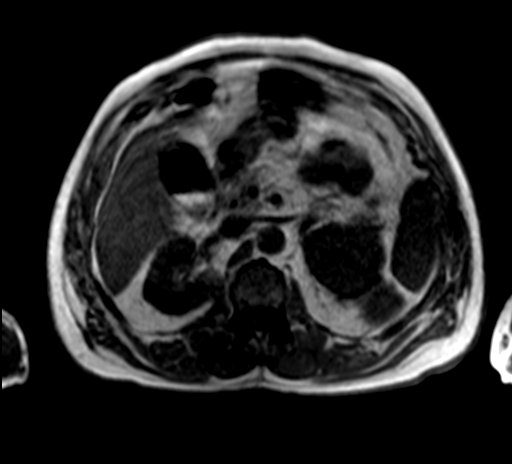
[im 35/44]
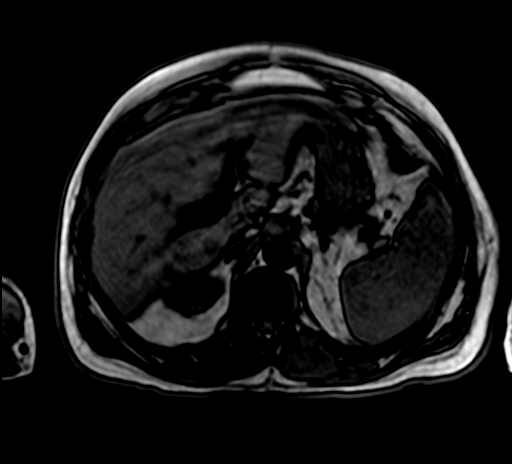
[im 44/44]
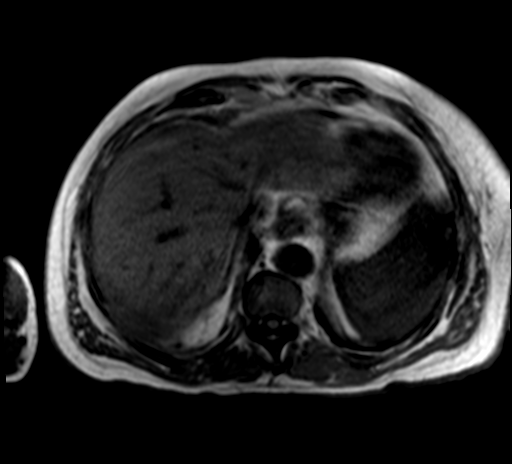

[Series 8: DWI · axial · 6.0mm · 1.88mm/px · z∈[-104,+104]mm · 9 of 90 slices shown]
[im 1/90]
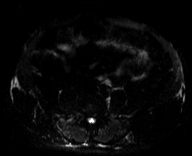
[im 17/90]
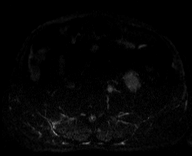
[im 25/90]
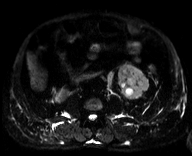
[im 41/90]
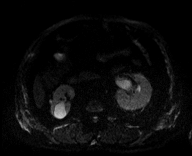
[im 49/90]
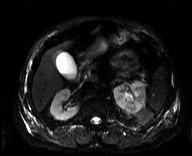
[im 65/90]
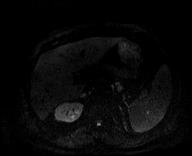
[im 73/90]
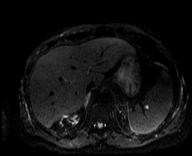
[im 81/90]
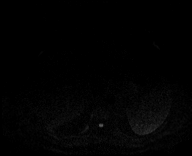
[im 90/90]
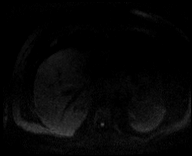

[Series 9: ax dwi_adc · axial · 6.0mm · 1.88mm/px · z∈[-104,+104]mm · 4 of 30 slices shown]
[im 1/30]
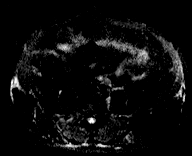
[im 10/30]
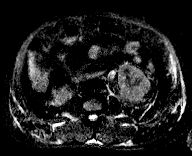
[im 20/30]
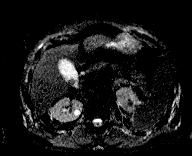
[im 30/30]
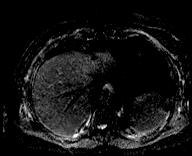

[Series 10: bSSFP · axial · 4.0mm · 0.70mm/px · z∈[-105,-41]mm · 3 of 50 slices shown]
[im 1/50]
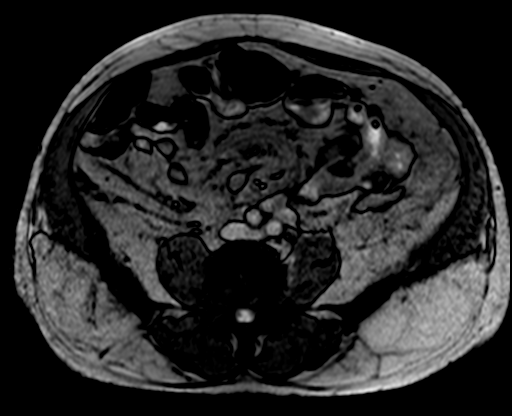
[im 9/50]
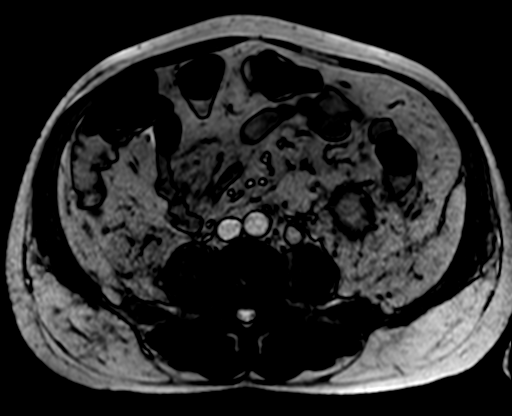
[im 17/50]
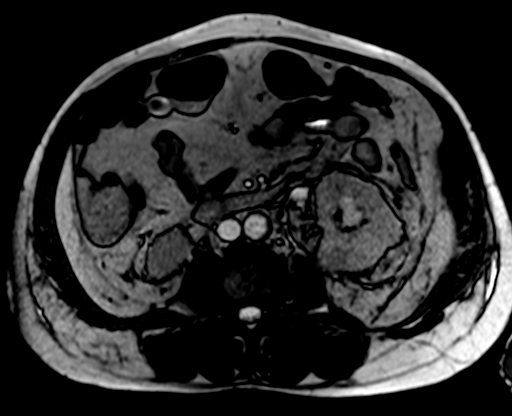

[30 of 48 positions shown; findings below may reference images not displayed]

FINDINGS: Despite efforts by the technologist and patient, motion artifact is
present on today's exam and could not be eliminated. This reduces
exam sensitivity and specificity. This is a common result when
abdominal MRI is attempted in the inpatient setting where patients
are less likely to be able to breath hold and cooperate in
controlling motion.

Lower chest: Trace bilateral pleural effusions.  Mild cardiomegaly.

Hepatobiliary: Sludge is present in the gallbladder along with a
suspected 1.5 cm gallstone on image [DATE]. The liver appears
otherwise unremarkable where included.

Pancreas:  Unremarkable

Spleen: The spleen measures 13.3 by 5.3 by 11.2 cm (volume = 410
cm^3). There are approximately 10 small foci of T2 signal
hyperintensity in the spleen which are technically nonspecific, but
which appear to of been present on prior exams such as 01/12/2016
and 06/19/2013, without significant progression.

Adrenals/Urinary Tract: Expanded size the left kidney with suspected
diffuse edema/infiltration based on accentuated T2 signal and
striated appearance of the kidney for example on image [DATE]. Left
hydroureter and mild hydronephrosis. The striated appearance of the
left kidney appears disproportionate to the degree of
hydronephrosis.. Heterogeneous T2 signal in the left kidney.
Bilateral fluid signal intensity lesions in the right kidney appear
roughly stable compared back to cysts seen on 06/19/2013. Left
kidney lower pole cyst on image [DATE] likewise similar to remote
prior exams. A 2.0 by 1.4 cm lesion anteriorly in the left kidney
lower pole on image [DATE] is nonspecific. There is some heterogeneity
of the fluid in the left collecting system, for example on image
[DATE].

Stomach/Bowel: Unremarkable

Vascular/Lymphatic:  Aortoiliac atherosclerotic vascular disease.

Other: Mild bilateral perirenal stranding, left greater than right,
with edema tracking along the retroperitoneum and paraspinal
musculature bilaterally. Some low-level edema tracking along the
bilateral flank musculature is well.

Musculoskeletal: Unremarkable
IMPRESSION: 1. Abnormal expansion of the left kidney with striated appearance
suggesting diffuse edema/infiltration. Mildly asymmetric left
perirenal stranding. Heterogeneous debris in the left collecting
system/dilated proximal left ureter. The striated appearance is
disproportionate to the degree of hydronephrosis, and I am
suspicious for pyelonephritis and possibly pyonephrosis.
Infiltrating renal malignancy is possible but less likely. No
expansion of the left renal vein to suggest renal vein thrombosis as
a alternative etiology.
2. Bilateral renal cysts. A lesion anteriorly in the left kidney
lower pole is technically nonspecific and clearly complex. This
could be a complex cyst or mass.
3. Cholelithiasis with sludge in the gallbladder.
4. Mild cardiomegaly with trace bilateral pleural effusions.
5. Scattered small T2 signal hyperintensities in the spleen,
nonspecific but stable from 06/19/2013 hence probably benign.
6.  Aortic Atherosclerosis ([id number]).

## 2020-03-11 ENCOUNTER — Other Ambulatory Visit: Payer: Self-pay | Admitting: Family Medicine

## 2020-03-11 ENCOUNTER — Inpatient Hospital Stay: Payer: PPO | Attending: Oncology

## 2020-03-11 ENCOUNTER — Other Ambulatory Visit: Payer: Self-pay | Admitting: Cardiovascular Disease

## 2020-03-11 NOTE — Telephone Encounter (Signed)
Rx request sent to pharmacy.  

## 2020-04-08 ENCOUNTER — Inpatient Hospital Stay: Payer: PPO | Attending: Oncology

## 2020-04-13 ENCOUNTER — Telehealth: Payer: Self-pay | Admitting: Cardiovascular Disease

## 2020-04-13 DIAGNOSIS — E1122 Type 2 diabetes mellitus with diabetic chronic kidney disease: Secondary | ICD-10-CM | POA: Diagnosis not present

## 2020-04-13 DIAGNOSIS — I10 Essential (primary) hypertension: Secondary | ICD-10-CM | POA: Diagnosis not present

## 2020-04-13 DIAGNOSIS — N1831 Chronic kidney disease, stage 3a: Secondary | ICD-10-CM | POA: Diagnosis not present

## 2020-04-13 DIAGNOSIS — D631 Anemia in chronic kidney disease: Secondary | ICD-10-CM | POA: Diagnosis not present

## 2020-04-13 NOTE — Telephone Encounter (Signed)
Patient has been contacted at least 3 times for a recall, recall has been removed

## 2020-04-25 ENCOUNTER — Other Ambulatory Visit: Payer: Self-pay | Admitting: Cardiovascular Disease

## 2020-05-06 DIAGNOSIS — K4090 Unilateral inguinal hernia, without obstruction or gangrene, not specified as recurrent: Secondary | ICD-10-CM

## 2020-05-06 HISTORY — DX: Unilateral inguinal hernia, without obstruction or gangrene, not specified as recurrent: K40.90

## 2020-05-09 ENCOUNTER — Inpatient Hospital Stay: Payer: PPO | Attending: Oncology

## 2020-05-10 ENCOUNTER — Other Ambulatory Visit: Payer: Self-pay | Admitting: Physician Assistant

## 2020-05-10 NOTE — Telephone Encounter (Signed)
Please contact pt for future appointment pt overdue for 6 month f/u. Pt needing refills.

## 2020-05-10 NOTE — Telephone Encounter (Signed)
LVM for patient to call back and schedule

## 2020-05-19 NOTE — Telephone Encounter (Signed)
Attempted to schedule, LVM  

## 2020-05-20 NOTE — Telephone Encounter (Signed)
Attempted to schedule (third attempt). Letter will be mailed and encounter closed

## 2020-06-06 ENCOUNTER — Inpatient Hospital Stay: Payer: PPO | Attending: Oncology

## 2020-06-28 ENCOUNTER — Other Ambulatory Visit: Payer: Self-pay | Admitting: Internal Medicine

## 2020-07-06 ENCOUNTER — Inpatient Hospital Stay: Payer: PPO | Attending: Oncology

## 2020-08-01 ENCOUNTER — Other Ambulatory Visit: Payer: Self-pay | Admitting: Physician Assistant

## 2020-08-02 NOTE — Telephone Encounter (Signed)
Please schedule office visit for further refills. Thank you!

## 2020-08-09 ENCOUNTER — Other Ambulatory Visit: Payer: Self-pay | Admitting: Cardiovascular Disease

## 2020-08-16 ENCOUNTER — Telehealth: Payer: Self-pay | Admitting: Oncology

## 2020-08-16 NOTE — Progress Notes (Signed)
02/13/2018 3:12 PM   [name] 11-Dec-1945 [id number]  Referring provider: Leone Haven, MD [address] [place],  [place] [address]  Urological history: 1. High risk hematuria -non-smoker -CTU 2019 no definite source for hematuria identified on today's examination. Specifically, no urinary tract calculi and no findings of urinary tract obstruction. In addition to multiple simple cysts there are several subcentimeter low-attenuation lesions in the kidneys bilaterally. These are too small to definitively characterize, but are favored to represent small cysts -cysto 2019 enlarged prostate -no reports of gross heme -UA 3-10 RBC's  2. Renal cysts -Non-contrast CT on 08/07/2018 Simple 3.1 cm posterior lower right renal cyst.  Simple 1.5 cm posterior lower left renal cyst  3. BPH with LU TS -I PSS 3/1 -managed with tamsulosin 0.4 mg daily   4. Prostate nodule - 5 mm x 5 mm nodule  -incidential finding on 08/2019 exam -patient deferred further work up   Chief Complaint  Patient presents with   Benign Prostatic Hypertrophy     HPI: Christopher Owenis a 75 y.o. male who presents today for one year follow up.    He has no urinary complaints at this time.    Patient denies any modifying or aggravating factors.  Patient denies any gross hematuria, dysuria or suprapubic/flank pain.  Patient denies any fevers, chills, nausea or vomiting.       IPSS     Row Name 08/17/20 1500         International Prostate Symptom Score   How often have you had the sensation of not emptying your bladder? Not at All     How often have you had to urinate less than every two hours? Not at All     How often have you found you stopped and started again several times when you urinated? About half the time     How often have you found it difficult to postpone urination? Not at All     How often have you had a weak urinary stream? Not at All     How often have you had to  strain to start urination? Not at All     How many times did you typically get up at night to urinate? None     Total IPSS Score 3           Quality of Life due to urinary symptoms     If you were to spend the rest of your life with your urinary condition just the way it is now how would you feel about that? Pleased              Score:  1-7 Mild 8-19 Moderate 20-35 Severe  PMH: Past Medical History:  Diagnosis Date   Acute renal failure (ARF) ([place]) 08/14/2017   Anemia    CAD (coronary artery disease)    a. 02/2015 Cath: LM nl, LAD 50/40 mid/distal, LCX 80p, RCA 40p, 30d, RPL1 40, CO 3.27, CI 1.65; b. cath 06/09/15: LM minor irregs, m-dLAD 50%, dLAD 40%, pLCx 80% s/p PCI/DES 0%, pRCA 40%, dRCA 30%, 1st RPLB 63%   Chronic systolic CHF (congestive heart failure) ([place])    a. 02/2015 Echo: EF 20-25%, diff HK, mod MR, mildy dil LA, mildly dil RV w/ mod RV syst dysfxn, mildly dil RA, mod TR, PASP 109mmHg; b. 09/2017 Echo: EF 40-45%, diff HK. Gr1 DD. Mild to mod MR. Mildly to mod dil LA.   Colon  cancer ([place])    a. 2015 s/p colectomy followed by chemoRx with oxaliplatin & fluorouacil.   Dia hypertension 07/20/2014   Diabetes mellitus without complication ([place])    Hypertension    Hypertensive heart disease    Mixed Ischemic and Non-ischemic Cardiomyopathy    a. 02/2015 Echo: EF 20-25%, diff HK; b. 09/2016 Echo: EF 40-45%.   Moderate mitral regurgitation    a. 09/2017 Echo: Mild to mod TR.   Tubular adenoma of colon     Surgical History: Past Surgical History:  Procedure Laterality Date   CARDIAC CATHETERIZATION Bilateral 02/24/2015   Procedure: Right/Left Heart Cath and Coronary Angiography;  Surgeon: Wellington Hampshire, MD;  Location: [facility];  Service: Cardiovascular;  Laterality: Bilateral;   CARDIAC CATHETERIZATION N/A 06/09/2015   Procedure: Coronary Stent Intervention;  Surgeon: Wellington Hampshire, MD;  Location: [facility];  Service: Cardiovascular;  Laterality: N/A;    COLON SURGERY     Colectomy   COLONOSCOPY     COLONOSCOPY WITH PROPOFOL N/A 12/11/2016   Procedure: COLONOSCOPY WITH PROPOFOL;  Surgeon: Lollie Sails, MD;  Location[facility];  Service: Endoscopy;  Laterality: N/A;   ESOPHAGOGASTRODUODENOSCOPY     PORT-A-CATH REMOVAL N/A 07/12/2014   Procedure: REMOVAL PORT-A-CATH;  Surgeon: Molly Maduro, MD;  Location: [facility] ORS;  Service: General;  Laterality: N/A;   PORTACATH PLACEMENT Left     Home Medications:  Allergies as of 08/17/2020       Reactions   Shellfish Allergy Other (See Comments)   Pt. instructed by MD to avoid seafood        Medication List        Accurate as of August 17, 2020  3:12 PM. If you have any questions, ask your nurse or doctor.          aspirin EC 81 MG tablet Take 1 tablet (81 mg total) by mouth daily.   atorvastatin 40 MG tablet Commonly known as: LIPITOR TAKE 1 TABLET(40 MG) BY MOUTH DAILY   atorvastatin 40 MG tablet Commonly known as: LIPITOR TAKE 1 TABLET(40 MG) BY MOUTH DAILY   atorvastatin 40 MG tablet Commonly known as: LIPITOR TAKE 1 TABLET BY MOUTH EVERY DAY   carvedilol 3.125 MG tablet Commonly known as: COREG TAKE 1 TABLET(3.125 MG) BY MOUTH TWICE DAILY   Entresto 24-26 MG Generic drug: sacubitril-valsartan TAKE 1 TABLET BY MOUTH TWICE DAILY   furosemide 40 MG tablet Commonly known as: LASIX TAKE 1 TABLET BY MOUTH ON MONDAY, WEDNESDAY AND FRIDAY, THEN 1/2 TABLET ON OTHERS DAYS   gabapentin 100 MG capsule Commonly known as: NEURONTIN Take 2 capsules (200 mg total) by mouth 3 (three) times daily.   metFORMIN 500 MG tablet Commonly known as: GLUCOPHAGE TAKE 1 TABLET(500 MG) BY MOUTH TWICE DAILY WITH A MEAL   spironolactone 25 MG tablet Commonly known as: ALDACTONE TAKE 1 TABLET(25 MG) BY MOUTH DAILY   tamsulosin 0.4 MG Caps capsule Commonly known as: FLOMAX TAKE 1 CAPSULE(0.4 MG) BY MOUTH DAILY AFTER SUPPER        Allergies:  Allergies  Allergen  Reactions   Shellfish Allergy Other (See Comments)    Pt. instructed by MD to avoid seafood    Family History: Family History  Problem Relation Age of Onset   Cancer Mother     Social History:  reports that he has never smoked. He has never used smokeless tobacco. He reports that he does not drink alcohol and does not use drugs.  ROS: For pertinent review of systems please refer to history of present illness  Physical Exam: BP (!) 143/61   Pulse 66   Ht 5\' 6"  (1.676 m)   Wt 158 lb (71.7 kg)   BMI 25.50 kg/m   Constitutional:  Well nourished. Alert and oriented, No acute distress. HEENT: [place] AT, mask in place  Trachea midline Cardiovascular: No clubbing, cyanosis, or edema. Respiratory: Normal respiratory effort, no increased work of breathing. GI: Large left hernia, patient states it has been there for three months GU: No CVA tenderness.  No bladder fullness or masses.  Patient with uncircumcised phallus.  Foreskin easily retracted.  Urethral meatus is patent.  No penile discharge. No penile lesions or rashes. Glans and foreskin with vitiligo.  Scrotum without lesions, cysts, rashes and/or edema.  Testicles are located scrotally bilaterally. No masses are appreciated in the testicles. Left and right epididymis are normal. Right hydrocele noted Rectal: Patient with  normal sphincter tone. Anus and perineum without scarring or rashes. No rectal masses are appreciated. Prostate is approximately 45 grams, 5 mm x 5 mm nodule along the right lobe ridge, unchanged is appreciated. Seminal vesicles are normal. Neurologic: Grossly intact, no focal deficits, moving all 4 extremities. Psychiatric: Normal mood and affect.   Laboratory Data: Urinalysis Component     Latest Ref Rng & Units 08/17/2020  Specific Gravity, UA     1.005 - 1.030 1.025  pH, UA     5.0 - 7.5 5.5  Color, UA     Yellow Yellow  Appearance Ur     Clear Clear  Leukocytes,UA     Negative Trace (A)  Protein,UA      Negative/Trace 2+ (A)  Glucose, UA     Negative Negative  Ketones, UA     Negative 1+ (A)  RBC, UA     Negative Trace (A)  Bilirubin, UA     Negative Negative  Urobilinogen, Ur     0.2 - 1.0 mg/dL 1.0  Nitrite, UA     Negative Negative  Microscopic Examination      See below:   Component     Latest Ref Rng & Units 08/17/2020  WBC, UA     0 - 5 /hpf 6-10 (A)  RBC     0 - 2 /hpf 3-10 (A)  Epithelial Cells (non renal)     0 - 10 /hpf 0-10  Bacteria, UA     None seen/Few Few   I have reviewed the labs.  Pertinent Imaging: No recent imaging  Assessment & Plan:    1. History of hematuria (high risk) -Hematuria work up completed in 09/2017- findings positive for renal cysts and enlarged prostate -No report of gross hematuria  -UA today 3-10 RBC's -Urine sent for culture.  If it is positive we will treat with antibiotic and recheck to ensure the microscopic hematuria has cleared.  If microscopic hematuria has not cleared or if urine culture returns negative, I explained that we need to pursue hematuria work-up with CT urogram and cystoscopy to rule out malignancy  2. BPH with LUTS -Continue conservative management, avoiding bladder irritants and timed voiding's -Continue tamsulosin 0.4 mg daily   3. Prostate nodule -stable  4. Large left inguinal hernia -refer to general surgery   Return for pending urine culture results .  These notes generated with voice recognition software. I apologize for typographical errors.  [place]  [place], [place] [address] [phone]  Zara Council, PA-C

## 2020-08-16 NOTE — Telephone Encounter (Signed)
08/16/2020 Left VM informing pt that appts on 7/13 & 7/15 have been r/s for 7/18 @ 2pm (labs) and 7/20 @ 1:15 (see NP) due to changes in the providers schedule. Left call back number in case there were any questions or scheduling conflicts  SRW

## 2020-08-17 ENCOUNTER — Other Ambulatory Visit: Payer: Self-pay

## 2020-08-17 ENCOUNTER — Encounter: Payer: Self-pay | Admitting: Urology

## 2020-08-17 ENCOUNTER — Inpatient Hospital Stay: Payer: PPO

## 2020-08-17 ENCOUNTER — Ambulatory Visit (INDEPENDENT_AMBULATORY_CARE_PROVIDER_SITE_OTHER): Payer: PPO | Admitting: Urology

## 2020-08-17 VITALS — BP 143/61 | HR 66 | Ht 66.0 in | Wt 158.0 lb

## 2020-08-17 DIAGNOSIS — R319 Hematuria, unspecified: Secondary | ICD-10-CM

## 2020-08-17 DIAGNOSIS — K4090 Unilateral inguinal hernia, without obstruction or gangrene, not specified as recurrent: Secondary | ICD-10-CM

## 2020-08-17 DIAGNOSIS — N401 Enlarged prostate with lower urinary tract symptoms: Secondary | ICD-10-CM

## 2020-08-17 DIAGNOSIS — R3121 Asymptomatic microscopic hematuria: Secondary | ICD-10-CM | POA: Diagnosis not present

## 2020-08-17 DIAGNOSIS — N402 Nodular prostate without lower urinary tract symptoms: Secondary | ICD-10-CM

## 2020-08-17 DIAGNOSIS — N138 Other obstructive and reflux uropathy: Secondary | ICD-10-CM

## 2020-08-17 LAB — URINALYSIS, COMPLETE
Bilirubin, UA: NEGATIVE
Glucose, UA: NEGATIVE
Nitrite, UA: NEGATIVE
Specific Gravity, UA: 1.025 (ref 1.005–1.030)
Urobilinogen, Ur: 1 mg/dL (ref 0.2–1.0)
pH, UA: 5.5 (ref 5.0–7.5)

## 2020-08-17 LAB — MICROSCOPIC EXAMINATION

## 2020-08-19 ENCOUNTER — Other Ambulatory Visit: Payer: Self-pay

## 2020-08-19 ENCOUNTER — Inpatient Hospital Stay: Payer: PPO | Admitting: Oncology

## 2020-08-19 DIAGNOSIS — E538 Deficiency of other specified B group vitamins: Secondary | ICD-10-CM

## 2020-08-19 DIAGNOSIS — C187 Malignant neoplasm of sigmoid colon: Secondary | ICD-10-CM

## 2020-08-20 LAB — CULTURE, URINE COMPREHENSIVE

## 2020-08-22 ENCOUNTER — Other Ambulatory Visit: Payer: Self-pay

## 2020-08-22 ENCOUNTER — Inpatient Hospital Stay: Payer: PPO | Attending: Oncology

## 2020-08-22 DIAGNOSIS — E876 Hypokalemia: Secondary | ICD-10-CM | POA: Diagnosis not present

## 2020-08-22 DIAGNOSIS — I110 Hypertensive heart disease with heart failure: Secondary | ICD-10-CM | POA: Diagnosis not present

## 2020-08-22 DIAGNOSIS — I255 Ischemic cardiomyopathy: Secondary | ICD-10-CM | POA: Insufficient documentation

## 2020-08-22 DIAGNOSIS — E538 Deficiency of other specified B group vitamins: Secondary | ICD-10-CM | POA: Diagnosis not present

## 2020-08-22 DIAGNOSIS — Z79899 Other long term (current) drug therapy: Secondary | ICD-10-CM | POA: Diagnosis not present

## 2020-08-22 DIAGNOSIS — Z7982 Long term (current) use of aspirin: Secondary | ICD-10-CM | POA: Diagnosis not present

## 2020-08-22 DIAGNOSIS — D61818 Other pancytopenia: Secondary | ICD-10-CM | POA: Insufficient documentation

## 2020-08-22 DIAGNOSIS — C187 Malignant neoplasm of sigmoid colon: Secondary | ICD-10-CM

## 2020-08-22 DIAGNOSIS — Z7984 Long term (current) use of oral hypoglycemic drugs: Secondary | ICD-10-CM | POA: Insufficient documentation

## 2020-08-22 DIAGNOSIS — E119 Type 2 diabetes mellitus without complications: Secondary | ICD-10-CM | POA: Insufficient documentation

## 2020-08-22 DIAGNOSIS — Z85038 Personal history of other malignant neoplasm of large intestine: Secondary | ICD-10-CM | POA: Insufficient documentation

## 2020-08-22 LAB — CBC WITH DIFFERENTIAL/PLATELET
Abs Immature Granulocytes: 0.01 10*3/uL (ref 0.00–0.07)
Basophils Absolute: 0 10*3/uL (ref 0.0–0.1)
Basophils Relative: 0 %
Eosinophils Absolute: 1.1 10*3/uL — ABNORMAL HIGH (ref 0.0–0.5)
Eosinophils Relative: 26 %
HCT: 31 % — ABNORMAL LOW (ref 39.0–52.0)
Hemoglobin: 10.7 g/dL — ABNORMAL LOW (ref 13.0–17.0)
Immature Granulocytes: 0 %
Lymphocytes Relative: 14 %
Lymphs Abs: 0.6 10*3/uL — ABNORMAL LOW (ref 0.7–4.0)
MCH: 32.6 pg (ref 26.0–34.0)
MCHC: 34.5 g/dL (ref 30.0–36.0)
MCV: 94.5 fL (ref 80.0–100.0)
Monocytes Absolute: 0.4 10*3/uL (ref 0.1–1.0)
Monocytes Relative: 9 %
Neutro Abs: 2.1 10*3/uL (ref 1.7–7.7)
Neutrophils Relative %: 51 %
Platelets: 114 10*3/uL — ABNORMAL LOW (ref 150–400)
RBC: 3.28 MIL/uL — ABNORMAL LOW (ref 4.22–5.81)
RDW: 13.1 % (ref 11.5–15.5)
WBC: 4.1 10*3/uL (ref 4.0–10.5)
nRBC: 0 % (ref 0.0–0.2)

## 2020-08-22 LAB — COMPREHENSIVE METABOLIC PANEL
ALT: 7 U/L (ref 0–44)
AST: 15 U/L (ref 15–41)
Albumin: 3.7 g/dL (ref 3.5–5.0)
Alkaline Phosphatase: 65 U/L (ref 38–126)
Anion gap: 9 (ref 5–15)
BUN: 14 mg/dL (ref 8–23)
CO2: 26 mmol/L (ref 22–32)
Calcium: 8.7 mg/dL — ABNORMAL LOW (ref 8.9–10.3)
Chloride: 104 mmol/L (ref 98–111)
Creatinine, Ser: 1.37 mg/dL — ABNORMAL HIGH (ref 0.61–1.24)
GFR, Estimated: 54 mL/min — ABNORMAL LOW (ref >60)
Glucose, Bld: 163 mg/dL — ABNORMAL HIGH (ref 70–99)
Potassium: 3.1 mmol/L — ABNORMAL LOW (ref 3.5–5.1)
Sodium: 139 mmol/L (ref 135–145)
Total Bilirubin: 1 mg/dL (ref 0.3–1.2)
Total Protein: 7.2 g/dL (ref 6.5–8.1)

## 2020-08-22 LAB — VITAMIN B12: Vitamin B-12: 187 pg/mL (ref 180–914)

## 2020-08-23 ENCOUNTER — Ambulatory Visit (INDEPENDENT_AMBULATORY_CARE_PROVIDER_SITE_OTHER): Payer: PPO | Admitting: Surgery

## 2020-08-23 ENCOUNTER — Telehealth: Payer: Self-pay | Admitting: Family Medicine

## 2020-08-23 ENCOUNTER — Telehealth: Payer: Self-pay | Admitting: Cardiovascular Disease

## 2020-08-23 ENCOUNTER — Telehealth: Payer: Self-pay | Admitting: Surgery

## 2020-08-23 ENCOUNTER — Telehealth: Payer: Self-pay

## 2020-08-23 ENCOUNTER — Other Ambulatory Visit: Payer: Self-pay

## 2020-08-23 ENCOUNTER — Encounter: Payer: Self-pay | Admitting: Surgery

## 2020-08-23 ENCOUNTER — Ambulatory Visit: Payer: Self-pay | Admitting: Surgery

## 2020-08-23 VITALS — BP 147/71 | HR 62 | Temp 98.7°F | Ht 69.0 in | Wt 156.8 lb

## 2020-08-23 DIAGNOSIS — K4090 Unilateral inguinal hernia, without obstruction or gangrene, not specified as recurrent: Secondary | ICD-10-CM

## 2020-08-23 NOTE — Telephone Encounter (Signed)
Primary Cardiologist:Muhammad Fletcher Anon, MD  Chart reviewed as part of pre-operative protocol coverage. Because of Christopher SARIN Jr.'s past medical history and time since last visit, [name] will require a follow-up visit in order to better assess preoperative cardiovascular risk.  Pre-op covering staff: - Please schedule appointment and call patient to inform them. - Please contact requesting surgeon's office via preferred method (i.e, phone, fax) to inform them of need for appointment prior to surgery.  If applicable, this message will also be routed to pharmacy pool and/or primary cardiologist for input on holding anticoagulant/antiplatelet agent as requested below so that this information is available at time of patient's appointment.   Deberah Pelton, NP  08/23/2020, 2:48 PM

## 2020-08-23 NOTE — Patient Instructions (Addendum)
Our surgery scheduler will call you within 24-48 hours to schedule your surgery. Please have the [facility] surgery sheet available when speaking with her.   Cardiac Clearance and Medical clearance have been sent.   Inguinal Hernia, Adult An inguinal hernia is when fat or your intestines push through a weak spot in a muscle where your leg meets your lower belly (groin). This causes a bulge. This kind of hernia could also be: In your scrotum, if you are male. In folds of skin around your vagina, if you are male. There are three types of inguinal hernias: Hernias that can be pushed back into the belly (are reducible). This type rarely causes pain. Hernias that cannot be pushed back into the belly (are incarcerated). Hernias that cannot be pushed back into the belly and lose their blood supply (are strangulated). This type needs emergency surgery. What are the causes? This condition is caused by having a weak spot in the muscles or tissues in your groin. This develops over time. The hernia may poke through the weak spot when you strain your lower belly muscles all of a sudden, such as when you: Lift a heavy object. Strain to poop (have a bowel movement). Trouble pooping (constipation) can lead to straining. Cough. What increases the risk? This condition is more likely to develop in: Males. Pregnant females. People who: Are overweight. Work in jobs that require long periods of standing or heavy lifting. Have had an inguinal hernia before. Smoke or have lung disease. These factors can lead to long-term (chronic) coughing. What are the signs or symptoms? Symptoms may depend on the size of the hernia. Often, a small hernia has no symptoms. Symptoms of a larger hernia may include: A bulge in the groin area. This is easier to see when standing. You might not be able to see it when you are lying down. Pain or burning in the groin. This may get worse when you lift, strain, or cough. A dull ache or a  feeling of pressure in the groin. An abnormal bulge in the scrotum, in males. Symptoms of a strangulated inguinal hernia may include: A bulge in your groin that is very painful and tender to the touch. A bulge that turns red or purple. Fever, feeling like you may vomit (nausea), and vomiting. Not being able to poop or to pass gas. How is this treated? Treatment depends on the size of your hernia and whether you have symptoms. If you do not have symptoms, your doctor may have you watch your hernia carefully and have you come in for follow-up visits. If your hernia is large or if youhave symptoms, you may need surgery to repair the hernia. Follow these instructions at home: Lifestyle Avoid lifting heavy objects. Avoid standing for long amounts of time. Do not smoke or use any products that contain nicotine or tobacco. If you need help quitting, ask your doctor. Stay at a healthy weight. Prevent trouble pooping You may need to take these actions to prevent or treat trouble pooping: Drink enough fluid to keep your pee (urine) pale yellow. Take over-the-counter or prescription medicines. Eat foods that are high in fiber. These include beans, whole grains, and fresh fruits and vegetables. Limit foods that are high in fat and sugar. These include fried or sweet foods. General instructions You may try to push your hernia back in place by very gently pressing on it when you are lying down. Do not try to push the bulge back in if it will  not go in easily. Watch your hernia for any changes in shape, size, or color. Tell your doctor if you see any changes. Take over-the-counter and prescription medicines only as told by your doctor. Keep all follow-up visits. Contact a doctor if: You have a fever or chills. You have new symptoms. Your symptoms get worse. Get help right away if: You have pain in your groin that gets worse all of a sudden. You have a bulge in your groin that: Gets bigger all of a  sudden, and it does not get smaller after that. Turns red or purple. Is painful when you touch it. You are a male, and you have: Sudden pain in your scrotum. A sudden change in the size of your scrotum. You cannot push the hernia back in place by very gently pressing on it when you are lying down. You feel like you may vomit, and that feeling does not go away. You keep vomiting. You have a fast heartbeat. You cannot poop or pass gas. These symptoms may be an emergency. Get help right away. Call your local emergency services (911 in the [place] Do not wait to see if the symptoms will go away. Do not drive yourself to the hospital. Summary An inguinal hernia is when fat or your intestines push through a weak spot in a muscle where your leg meets your lower belly (groin). This causes a bulge. If you do not have symptoms, you may not need treatment. If you have symptoms or a large hernia, you may need surgery. Avoid lifting heavy objects. Also, avoid standing for long amounts of time. Do not try to push the bulge back in if it will not go in easily. This information is not intended to replace advice given to you by your health care provider. Make sure you discuss any questions you have with your healthcare provider. Document Revised: 09/22/2019 Document Reviewed: 09/22/2019 Elsevier Patient Education  2022 [employer].

## 2020-08-23 NOTE — H&P (View-Only) (Signed)
Patient ID: [name]., male   DOB: [birth year], 75 y.o.   MRN: [id number]  Chief Complaint: Left inguinal hernia  History of Present Illness Christopher Dommer. is a 75 y.o. male with a left groin bulge he noticed about 3 months ago.  Denies any pain with urination or bowel movements.  Denies any pain in general.  Does have some discomfort with standing.  He reports that spontaneously reduces in supine positioning.  Denies any cough pain or sneezing pain.  Reports he is able to mow half of his lawn in a day, and remains relatively active despite his prior cardiac history.  He denies home oxygen use.  He is not taking any anticoagulation.  He has a history of sigmoid colectomy for Stage IIIC colon cancer 2015, completed course of chemotherapy post surgery.  **ECHOCARDIOGRAM REPORT** Patient Name: [name]. Date of Exam: 09/09/2019 Medical Rec #: [id number] Height: 69.0 in Accession #: [id number] Weight: 182.6 lb Date of Birth: Nov 14, 1945 BSA: 1.988 m Patient Age: 69 years BP: 116/64 mmHg Patient Gender: M HR: 66 bpm. Exam Location: [facility] Procedure: 2D Echo, Cardiac Doppler and Color Doppler Indications: I50.20* Unspecified systolic (congestive) heart failure History: Patient has prior history of Echocardiogram examinations, most recent 12/11/2018. CHF and Cardiomyopathy, CAD, Pulmonary HTN, Signs/Symptoms:Hypertensive Heart Disease; Risk Factors:Hypertension, Diabetes and Non-Smoker. Sonographer: Pilar Jarvis RDMS, RVT, RDCS Referring Phys: [place] Left Ventricle: Left ventricular ejection fraction, by estimation, is 25 to 30%. The left ventricle has severely decreased function. The left ventricle demonstrates global hypokinesis. The left ventricular internal cavity size was normal in size. There is no left ventricular hypertrophy. Left ventricular diastolic parameters are indeterminate. Right Ventricle: The right ventricular size is  normal. No increase in right ventricular wall thickness. Right ventricular systolic function is moderately reduced. There is severely elevated pulmonary artery systolic pressure. The tricuspid regurgitant velocity is 4.54 m/s, and with an assumed right atrial pressure of 15 mmHg, the estimated right ventricular systolic pressure is 76.1 mmHg. Left Atrium: Left atrial size was moderately dilated. Right Atrium: Right atrial size was mildly dilated. Pericardium: Trivial pericardial effusion is present. Mitral Valve: The mitral valve is normal in structure. Mild to moderate mitral valve regurgitation. Tricuspid Valve: The tricuspid valve is normal in structure. Tricuspid valve regurgitation is mild. Aortic Valve: The aortic valve is normal in structure. Aortic valve regurgitation is not visualized. Aortic valve mean gradient measures 2.0 mmHg. Aortic valve peak gradient measures 3.9 mmHg. Aortic valve area, by VTI measures 2.32 cm. Pulmonic Valve: The pulmonic valve was normal in structure. Pulmonic valve regurgitation is trivial. Aorta: The aortic root is normal in size and structure. Venous: The inferior vena cava is dilated in size with less than 50% respiratory variability, suggesting right atrial pressure of 15 mmHg. IAS/Shunts: No atrial level shunt detected by color flow Doppler.   Past Medical History Past Medical History:  Diagnosis Date   Acute renal failure (ARF) ([place]) 08/14/2017   Anemia    CAD (coronary artery disease)    a. 02/2015 Cath: LM nl, LAD 50/40 mid/distal, LCX 80p, RCA 40p, 30d, RPL1 40, CO 3.27, CI 1.65; b. cath 06/09/15: LM minor irregs, m-dLAD 50%, dLAD 40%, pLCx 80% s/p PCI/DES 0%, pRCA 40%, dRCA 30%, 1st RPLB 60%   Chronic systolic CHF (congestive heart failure) ([place])    a. 02/2015 Echo: EF 20-25%, diff HK, mod MR, mildy dil LA, mildly dil RV w/ mod RV syst dysfxn, mildly  dil RA, mod TR, PASP 73mmHg; b. 09/2017 Echo: EF 40-45%, diff HK. Gr1 DD. Mild to mod MR. Mildly to  mod dil LA.   Colon cancer ([place])    a. 2015 s/p colectomy followed by chemoRx with oxaliplatin & fluorouacil.   Dia hypertension 07/20/2014   Diabetes mellitus without complication ([place])    Hypertension    Hypertensive heart disease    Mixed Ischemic and Non-ischemic Cardiomyopathy    a. 02/2015 Echo: EF 20-25%, diff HK; b. 09/2016 Echo: EF 40-45%.   Moderate mitral regurgitation    a. 09/2017 Echo: Mild to mod TR.   Tubular adenoma of colon       Past Surgical History:  Procedure Laterality Date   CARDIAC CATHETERIZATION Bilateral 02/24/2015   Procedure: Right/Left Heart Cath and Coronary Angiography;  Surgeon: Wellington Hampshire, MD;  Location: [facility];  Service: Cardiovascular;  Laterality: Bilateral;   CARDIAC CATHETERIZATION N/A 06/09/2015   Procedure: Coronary Stent Intervention;  Surgeon: Wellington Hampshire, MD;  Location: [facility];  Service: Cardiovascular;  Laterality: N/A;   COLON SURGERY     Colectomy   COLONOSCOPY     COLONOSCOPY WITH PROPOFOL N/A 12/11/2016   Procedure: COLONOSCOPY WITH PROPOFOL;  Surgeon: Lollie Sails, MD;  Location: [facility];  Service: Endoscopy;  Laterality: N/A;   ESOPHAGOGASTRODUODENOSCOPY     PORT-A-CATH REMOVAL N/A 07/12/2014   Procedure: REMOVAL PORT-A-CATH;  Surgeon: Molly Maduro, MD;  Location: [facility] ORS;  Service: General;  Laterality: N/A;   PORTACATH PLACEMENT Left     Allergies  Allergen Reactions   Shellfish Allergy Other (See Comments)    Pt. instructed by MD to avoid seafood    Current Outpatient Medications  Medication Sig Dispense Refill   aspirin EC 81 MG tablet Take 1 tablet (81 mg total) by mouth daily. 90 tablet 3   atorvastatin (LIPITOR) 40 MG tablet TAKE 1 TABLET BY MOUTH EVERY DAY 90 tablet 3   carvedilol (COREG) 3.125 MG tablet TAKE 1 TABLET(3.125 MG) BY MOUTH TWICE DAILY 180 tablet 3   ENTRESTO 24-26 MG TAKE 1 TABLET BY MOUTH TWICE DAILY 60 tablet 0   furosemide (LASIX) 40 MG tablet TAKE 1  TABLET BY MOUTH ON MONDAY, WEDNESDAY AND FRIDAY, THEN 1/2 TABLET ON OTHERS DAYS 20 tablet 0   gabapentin (NEURONTIN) 100 MG capsule Take by mouth.     metFORMIN (GLUCOPHAGE) 500 MG tablet TAKE 1 TABLET(500 MG) BY MOUTH TWICE DAILY WITH A MEAL 180 tablet 3   spironolactone (ALDACTONE) 25 MG tablet TAKE 1 TABLET(25 MG) BY MOUTH DAILY 30 tablet 0   tamsulosin (FLOMAX) 0.4 MG CAPS capsule TAKE 1 CAPSULE(0.4 MG) BY MOUTH DAILY AFTER SUPPER 30 capsule 11   No current facility-administered medications for this visit.    Family History Family History  Problem Relation Age of Onset   Cancer Mother       Social History Social History   Tobacco Use   Smoking status: Never   Smokeless tobacco: Never  Vaping Use   Vaping Use: Never used  Substance Use Topics   Alcohol use: No   Drug use: No        Review of Systems  Constitutional:  Positive for weight loss.  HENT: Negative.    Eyes: Negative.   Respiratory:  Positive for cough.   Cardiovascular: Negative.   Gastrointestinal:  Positive for abdominal pain.  Genitourinary: Negative.   Skin: Negative.   Neurological: Negative.   Psychiatric/Behavioral: Negative.  Physical Exam Blood pressure (!) 147/71, pulse 62, temperature 98.7 F (37.1 C), temperature source Oral, height 5\' 9"  (1.753 m), weight 156 lb 12.8 oz (71.1 kg), SpO2 98 %. Last Weight  Most recent update: 08/23/2020 11:11 AM    Weight  71.1 kg (156 lb 12.8 oz)             CONSTITUTIONAL: Well developed, African-American male with evidence of weight loss, adequately nourished, appropriately responsive and aware without distress.   EYES: Sclera non-icteric.   EARS, NOSE, MOUTH AND THROAT: Mask worn.   The oropharynx is clear. Oral mucosa is pink and moist.    Hearing is intact to voice.  NECK: Trachea is midline, and there is no jugular venous distension.  LYMPH NODES:  Lymph nodes in the neck are not enlarged. RESPIRATORY:  Lungs are clear, and breath sounds  are equal bilaterally. Normal respiratory effort without pathologic use of accessory muscles. CARDIOVASCULAR: Heart is regular in rate and rhythm. GI: The abdomen is flat with evidence of weight loss, there is a midline scar that is well-healed without fascial defect apparent, soft, nontender, and nondistended. There were no palpable masses. I did not appreciate hepatosplenomegaly. There were normal bowel sounds. GU: Large left inguinal hernia, readily reducible.  No appreciable right sided hernia.  Testes distended bilaterally, with possible hydrocele on the right. MUSCULOSKELETAL:  Symmetrical muscle tone appreciated in all four extremities.    SKIN: Skin turgor is normal. No pathologic skin lesions appreciated.  NEUROLOGIC:  Motor and sensation appear grossly normal.  Cranial nerves are grossly without defect. PSYCH:  Alert and oriented to person, place and time. Affect is appropriate for situation.  Data Reviewed I have personally reviewed what is currently available of the patient's imaging, recent labs and medical records.   Labs:  CBC Latest Ref Rng & Units 08/22/2020 08/19/2019 02/17/2019  WBC 4.0 - 10.5 K/uL 4.1 3.2(L) 3.0(L)  Hemoglobin 13.0 - 17.0 g/dL 10.7(L) 12.2(L) 11.6(L)  Hematocrit 39.0 - 52.0 % 31.0(L) 36.1(L) 34.7(L)  Platelets 150 - 400 K/uL 114(L) 132(L) 104(L)   CMP Latest Ref Rng & Units 08/22/2020 08/19/2019 08/11/2019  Glucose 70 - 99 mg/dL 163(H) 163(H) 116(H)  BUN 8 - 23 mg/dL 14 18 13   Creatinine 0.61 - 1.24 mg/dL 1.37(H) 1.49(H) 1.45(H)  Sodium 135 - 145 mmol/L 139 141 145(H)  Potassium 3.5 - 5.1 mmol/L 3.1(L) 3.6 3.8  Chloride 98 - 111 mmol/L 104 108 106  CO2 22 - 32 mmol/L 26 25 24   Calcium 8.9 - 10.3 mg/dL 8.7(L) 8.8(L) 9.0  Total Protein 6.5 - 8.1 g/dL 7.2 7.2 -  Total Bilirubin 0.3 - 1.2 mg/dL 1.0 1.1 -  Alkaline Phos 38 - 126 U/L 65 74 -  AST 15 - 41 U/L 15 15 -  ALT 0 - 44 U/L 7 11 -      Imaging: Radiology review:  CT CHEST, ABDOMEN AND PELVIS  WITHOUT CONTRAST   TECHNIQUE: Multidetector CT imaging of the chest, abdomen and pelvis was performed following the standard protocol without IV contrast.   COMPARISON:  CT the chest, abdomen and pelvis 08/07/2018.   FINDINGS: CT CHEST FINDINGS   Cardiovascular: Heart size is mildly enlarged. There is no significant pericardial fluid, thickening or pericardial calcification. There is aortic atherosclerosis, as well as atherosclerosis of the great vessels of the mediastinum and the coronary arteries, including calcified atherosclerotic plaque in the left main, left anterior descending, left circumflex and right coronary arteries. Mild calcifications of the aortic  valve.   Mediastinum/Nodes: No pathologically enlarged mediastinal or hilar lymph nodes. Please note that accurate exclusion of hilar adenopathy is limited on noncontrast CT scans. Esophagus is unremarkable in appearance. No axillary lymphadenopathy.   Lungs/Pleura: 5 mm right upper lobe pulmonary nodule (axial image 46 of series 4). 2 mm left upper lobe pulmonary nodule (axial image 25 of series 4). No other larger more suspicious appearing pulmonary nodules or masses are noted. No acute consolidative airspace disease. No pleural effusions.   Musculoskeletal: There are no aggressive appearing lytic or blastic lesions noted in the visualized portions of the skeleton.   CT ABDOMEN PELVIS FINDINGS   Hepatobiliary: No definite suspicious cystic or solid hepatic lesions are confidently identified on today's noncontrast CT examination. Unenhanced appearance of the gallbladder is normal.   Pancreas: No definite pancreatic mass or peripancreatic fluid collections or inflammatory changes are noted on today's noncontrast CT examination.   Spleen: Unremarkable.   Adrenals/Urinary Tract: Multiple low-attenuation lesions in both kidneys, similar to the prior study but incompletely characterized on today's non-contrast CT  examination, but statistically likely to represent cysts, largest of which measures 3.6 cm in the interpolar region of the right kidney. Bilateral adrenal glands are normal in appearance. No hydroureteronephrosis. Urinary bladder is nearly decompressed, but otherwise unremarkable in appearance.   Stomach/Bowel: Unenhanced appearance of the stomach is normal. No pathologic dilatation of small bowel or colon. Status post appendectomy.   Vascular/Lymphatic: Aortic atherosclerosis. No lymphadenopathy noted in the abdomen or pelvis.   Reproductive: Prostate gland and seminal vesicles are unremarkable in appearance. Bilateral hydroceles incompletely imaged.   Other: No significant volume of ascites.  No pneumoperitoneum.   Musculoskeletal: There are no aggressive appearing lytic or blastic lesions noted in the visualized portions of the skeleton.   IMPRESSION: 1. No definitive evidence to suggest metastatic disease in the chest, abdomen or pelvis. 2. There are small pulmonary nodules in the lungs measuring 5 mm or less in size, as above, nonspecific but statistically likely benign. Attention at time of routine follow-up imaging is recommended to ensure stability or resolution. 3. Aortic atherosclerosis, in addition to left main and 3 vessel coronary artery disease. Assessment for potential risk factor modification, dietary therapy or pharmacologic therapy may be warranted, if clinically indicated. 4. There are calcifications of the aortic valve. Echocardiographic correlation for evaluation of potential valvular dysfunction may be warranted if clinically indicated. 5. Mild cardiomegaly.     Electronically Signed   By: Vinnie Langton M.D.   On: 08/17/2019 10:47 Within last 24 hrs: No results found.  Assessment    Left inguinal hernia Patient Active Problem List   Diagnosis Date Noted   Anemia in chronic kidney disease 04/08/2019   Stage 3a chronic kidney disease ([place])  04/08/2019   Hyperlipidemia 04/28/2018   Blistering of skin 01/23/2018   Leg wound, left, initial encounter 01/02/2018   Decreased pedal pulses 01/02/2018   Onychomycosis 01/02/2018   Leukopenia 09/28/2017   Thrombocytopenia ([place]) 09/28/2017   B12 deficiency 09/28/2017   Renal mass 09/10/2017   Diabetes ([place]) 09/10/2017   Leg wound, right, initial encounter 09/10/2017   Anemia 09/12/2015   Effort angina ([place]) 06/09/2015   Coronary artery disease involving native coronary artery of native heart without angina pectoris    Hypertensive heart disease    CAD (coronary artery disease)    Mixed Ischemic and Non-ischemic Cardiomyopathy    Chronic systolic heart failure ([place])    Bilateral lower extremity edema 02/10/2015   Abdominal pain  02/10/2015   Cough 02/10/2015   Neuropathy ([place]) 09/16/2014   Tubular adenoma of colon 07/20/2014   History of colon cancer 07/20/2014   Benign colon polyp 07/20/2014   Hypertension 07/20/2014   Hyperplastic colon polyp 07/20/2014    Plan     We will pursue cardiology clearance prior to considering elective surgery.  We discussed robotic repair of left inguinal hernia.   It seems that despite the fact he is tolerating the presence of this hernia well, he appears desiring to pursue its repair.   We discussed at length the risks of anesthesia, versus that of continuing to observe his hernia.  We discussed the elective nature of his repair and the elements that would cause it to be more urgent/emergent.  I discussed possibility of incarceration, strangulation, enlargement in size over time, and the need for emergency surgery in the face of these.  Also reviewed the techniques of reduction should incarceration occur, and when unsuccessful to present to the ED.  Also discussed that surgery risks include recurrence which can be up to 30% in the case of complex hernias, use of prosthetic materials (mesh) and the increased risk of infection and the possible  need for re-operation and removal of mesh, possibility of post-op SBO or ileus, and the risks of general anesthetic including heart attack, stroke, sudden death or some reaction to anesthetic medications. The patient, and those present, appear to understand the risks, any and all questions were answered to the patient's satisfaction.  No guarantees were ever expressed or implied.    Face-to-face time spent with the patient and accompanying care providers(if present) was 50 minutes, with more than 50% of the time spent counseling, educating, and coordinating care of the patient.    These notes generated with voice recognition software. I apologize for typographical errors.  Ronny Bacon M.D., FACS 08/23/2020, 11:32 AM

## 2020-08-23 NOTE — Telephone Encounter (Signed)
   [facility] Pre-operative Risk Assessment    Patient Name: [name].  DOB: January 13, 1946 MRN: [id number]  HEARTCARE STAFF:  - IMPORTANT!!!!!! Under Visit Info/Reason for Call, type in Other and utilize the format Clearance MM/DD/YY or Clearance TBD. Do not use dashes or single digits. - Please review there is not already an duplicate clearance open for this procedure. - If request is for dental extraction, please clarify the # of teeth to be extracted. - If the patient is currently at the dentist's office, call Pre-Op Callback Staff (MA/nurse) to input urgent request.  - If the patient is not currently in the dentist office, please route to the Pre-Op pool.  Request for surgical clearance:  What type of surgery is being performed? Inguinal Hernia Repair  When is this surgery scheduled? TBD  What type of clearance is required (medical clearance vs. Pharmacy clearance to hold med vs. Both)? Both  Are there any medications that need to be held prior to surgery and how long? Not listed please advise  Practice name and name of physician performing surgery? [facility], Dr. Ronny Bacon  What is the office phone number? [phone]   7.   What is the office fax number? [phone]  8.   Anesthesia type (None, local, MAC, general) ? Not listed   Pilar A Ham 08/23/2020, 2:31 PM  _________________________________________________________________   (provider comments below)

## 2020-08-23 NOTE — Telephone Encounter (Signed)
Patient is scheduled for 08/26/20 at 9:40 with Dr Fletcher Anon. I advised patient that Dr Fletcher Anon will be able to evaluate him for pre-op clearance as well at this appointment. He voiced understanding.

## 2020-08-23 NOTE — Telephone Encounter (Signed)
Patient is going to have surgery and wants to know if Dr Caryl Bis needs to see him.

## 2020-08-23 NOTE — Progress Notes (Addendum)
Patient ID: Emeril Stille., male   DOB: 1945/12/10, 75 y.o.   MRN: [id number]  Chief Complaint: Left inguinal hernia  History of Present Illness Montez Cuda. is a 75 y.o. male with a left groin bulge he noticed about 3 months ago.  Denies any pain with urination or bowel movements.  Denies any pain in general.  Does have some discomfort with standing.  He reports that spontaneously reduces in supine positioning.  Denies any cough pain or sneezing pain.  Reports he is able to mow half of his lawn in a day, and remains relatively active despite his prior cardiac history.  He denies home oxygen use.  He is not taking any anticoagulation.  He has a history of sigmoid colectomy for Stage IIIC colon cancer 2015, completed course of chemotherapy post surgery.  **ECHOCARDIOGRAM REPORT** Patient Name: Hargis Vandyne. Date of Exam: 09/09/2019 Medical Rec #: [id number] Height: 69.0 in Accession #: [id number] Weight: 182.6 lb Date of Birth: 1945/05/23 BSA: 1.988 m Patient Age: 81 years BP: 116/64 mmHg Patient Gender: M HR: 66 bpm. Exam Location: [facility] Procedure: 2D Echo, Cardiac Doppler and Color Doppler Indications: I50.20* Unspecified systolic (congestive) heart failure History: Patient has prior history of Echocardiogram examinations, most recent 12/11/2018. CHF and Cardiomyopathy, CAD, Pulmonary HTN, Signs/Symptoms:Hypertensive Heart Disease; Risk Factors:Hypertension, Diabetes and Non-Smoker. Sonographer: Pilar Jarvis RDMS, RVT, RDCS Referring Phys: [place] Left Ventricle: Left ventricular ejection fraction, by estimation, is 25 to 30%. The left ventricle has severely decreased function. The left ventricle demonstrates global hypokinesis. The left ventricular internal cavity size was normal in size. There is no left ventricular hypertrophy. Left ventricular diastolic parameters are indeterminate. Right Ventricle: The right ventricular size is  normal. No increase in right ventricular wall thickness. Right ventricular systolic function is moderately reduced. There is severely elevated pulmonary artery systolic pressure. The tricuspid regurgitant velocity is 4.54 m/s, and with an assumed right atrial pressure of 15 mmHg, the estimated right ventricular systolic pressure is 70.6 mmHg. Left Atrium: Left atrial size was moderately dilated. Right Atrium: Right atrial size was mildly dilated. Pericardium: Trivial pericardial effusion is present. Mitral Valve: The mitral valve is normal in structure. Mild to moderate mitral valve regurgitation. Tricuspid Valve: The tricuspid valve is normal in structure. Tricuspid valve regurgitation is mild. Aortic Valve: The aortic valve is normal in structure. Aortic valve regurgitation is not visualized. Aortic valve mean gradient measures 2.0 mmHg. Aortic valve peak gradient measures 3.9 mmHg. Aortic valve area, by VTI measures 2.32 cm. Pulmonic Valve: The pulmonic valve was normal in structure. Pulmonic valve regurgitation is trivial. Aorta: The aortic root is normal in size and structure. Venous: The inferior vena cava is dilated in size with less than 50% respiratory variability, suggesting right atrial pressure of 15 mmHg. IAS/Shunts: No atrial level shunt detected by color flow Doppler.   Past Medical History Past Medical History:  Diagnosis Date   Acute renal failure (ARF) ([place]) 08/14/2017   Anemia    CAD (coronary artery disease)    a. 02/2015 Cath: LM nl, LAD 50/40 mid/distal, LCX 80p, RCA 40p, 30d, RPL1 40, CO 3.27, CI 1.65; b. cath 06/09/15: LM minor irregs, m-dLAD 50%, dLAD 40%, pLCx 80% s/p PCI/DES 0%, pRCA 40%, dRCA 30%, 1st RPLB 23%   Chronic systolic CHF (congestive heart failure) ([place])    a. 02/2015 Echo: EF 20-25%, diff HK, mod MR, mildy dil LA, mildly dil RV w/ mod RV syst dysfxn, mildly  dil RA, mod TR, PASP 79mmHg; b. 09/2017 Echo: EF 40-45%, diff HK. Gr1 DD. Mild to mod MR. Mildly to  mod dil LA.   Colon cancer ([place])    a. 2015 s/p colectomy followed by chemoRx with oxaliplatin & fluorouacil.   Dia hypertension 07/20/2014   Diabetes mellitus without complication ([place])    Hypertension    Hypertensive heart disease    Mixed Ischemic and Non-ischemic Cardiomyopathy    a. 02/2015 Echo: EF 20-25%, diff HK; b. 09/2016 Echo: EF 40-45%.   Moderate mitral regurgitation    a. 09/2017 Echo: Mild to mod TR.   Tubular adenoma of colon       Past Surgical History:  Procedure Laterality Date   CARDIAC CATHETERIZATION Bilateral 02/24/2015   Procedure: Right/Left Heart Cath and Coronary Angiography;  Surgeon: Wellington Hampshire, MD;  Location: [facility];  Service: Cardiovascular;  Laterality: Bilateral;   CARDIAC CATHETERIZATION N/A 06/09/2015   Procedure: Coronary Stent Intervention;  Surgeon: Wellington Hampshire, MD;  Location: [facility];  Service: Cardiovascular;  Laterality: N/A;   COLON SURGERY     Colectomy   COLONOSCOPY     COLONOSCOPY WITH PROPOFOL N/A 12/11/2016   Procedure: COLONOSCOPY WITH PROPOFOL;  Surgeon: Lollie Sails, MD;  [facility];  Service: Endoscopy;  Laterality: N/A;   ESOPHAGOGASTRODUODENOSCOPY     PORT-A-CATH REMOVAL N/A 07/12/2014   Procedure: REMOVAL PORT-A-CATH;  Surgeon: Molly Maduro, MD;  Location: [facility] ORS;  Service: General;  Laterality: N/A;   PORTACATH PLACEMENT Left     Allergies  Allergen Reactions   Shellfish Allergy Other (See Comments)    Pt. instructed by MD to avoid seafood    Current Outpatient Medications  Medication Sig Dispense Refill   aspirin EC 81 MG tablet Take 1 tablet (81 mg total) by mouth daily. 90 tablet 3   atorvastatin (LIPITOR) 40 MG tablet TAKE 1 TABLET BY MOUTH EVERY DAY 90 tablet 3   carvedilol (COREG) 3.125 MG tablet TAKE 1 TABLET(3.125 MG) BY MOUTH TWICE DAILY 180 tablet 3   ENTRESTO 24-26 MG TAKE 1 TABLET BY MOUTH TWICE DAILY 60 tablet 0   furosemide (LASIX) 40 MG tablet TAKE 1  TABLET BY MOUTH ON MONDAY, WEDNESDAY AND FRIDAY, THEN 1/2 TABLET ON OTHERS DAYS 20 tablet 0   gabapentin (NEURONTIN) 100 MG capsule Take by mouth.     metFORMIN (GLUCOPHAGE) 500 MG tablet TAKE 1 TABLET(500 MG) BY MOUTH TWICE DAILY WITH A MEAL 180 tablet 3   spironolactone (ALDACTONE) 25 MG tablet TAKE 1 TABLET(25 MG) BY MOUTH DAILY 30 tablet 0   tamsulosin (FLOMAX) 0.4 MG CAPS capsule TAKE 1 CAPSULE(0.4 MG) BY MOUTH DAILY AFTER SUPPER 30 capsule 11   No current facility-administered medications for this visit.    Family History Family History  Problem Relation Age of Onset   Cancer Mother       Social History Social History   Tobacco Use   Smoking status: Never   Smokeless tobacco: Never  Vaping Use   Vaping Use: Never used  Substance Use Topics   Alcohol use: No   Drug use: No        Review of Systems  Constitutional:  Positive for weight loss.  HENT: Negative.    Eyes: Negative.   Respiratory:  Positive for cough.   Cardiovascular: Negative.   Gastrointestinal:  Positive for abdominal pain.  Genitourinary: Negative.   Skin: Negative.   Neurological: Negative.   Psychiatric/Behavioral: Negative.  Physical Exam Blood pressure (!) 147/71, pulse 62, temperature 98.7 F (37.1 C), temperature source Oral, height 5\' 9"  (1.753 m), weight 156 lb 12.8 oz (71.1 kg), SpO2 98 %. Last Weight  Most recent update: 08/23/2020 11:11 AM    Weight  71.1 kg (156 lb 12.8 oz)             CONSTITUTIONAL: Well developed, African-American male with evidence of weight loss, adequately nourished, appropriately responsive and aware without distress.   EYES: Sclera non-icteric.   EARS, NOSE, MOUTH AND THROAT: Mask worn.   The oropharynx is clear. Oral mucosa is pink and moist.    Hearing is intact to voice.  NECK: Trachea is midline, and there is no jugular venous distension.  LYMPH NODES:  Lymph nodes in the neck are not enlarged. RESPIRATORY:  Lungs are clear, and breath sounds  are equal bilaterally. Normal respiratory effort without pathologic use of accessory muscles. CARDIOVASCULAR: Heart is regular in rate and rhythm. GI: The abdomen is flat with evidence of weight loss, there is a midline scar that is well-healed without fascial defect apparent, soft, nontender, and nondistended. There were no palpable masses. I did not appreciate hepatosplenomegaly. There were normal bowel sounds. GU: Large left inguinal hernia, readily reducible.  No appreciable right sided hernia.  Testes distended bilaterally, with possible hydrocele on the right. MUSCULOSKELETAL:  Symmetrical muscle tone appreciated in all four extremities.    SKIN: Skin turgor is normal. No pathologic skin lesions appreciated.  NEUROLOGIC:  Motor and sensation appear grossly normal.  Cranial nerves are grossly without defect. PSYCH:  Alert and oriented to person, place and time. Affect is appropriate for situation.  Data Reviewed I have personally reviewed what is currently available of the patient's imaging, recent labs and medical records.   Labs:  CBC Latest Ref Rng & Units 08/22/2020 08/19/2019 02/17/2019  WBC 4.0 - 10.5 K/uL 4.1 3.2(L) 3.0(L)  Hemoglobin 13.0 - 17.0 g/dL 10.7(L) 12.2(L) 11.6(L)  Hematocrit 39.0 - 52.0 % 31.0(L) 36.1(L) 34.7(L)  Platelets 150 - 400 K/uL 114(L) 132(L) 104(L)   CMP Latest Ref Rng & Units 08/22/2020 08/19/2019 08/11/2019  Glucose 70 - 99 mg/dL 163(H) 163(H) 116(H)  BUN 8 - 23 mg/dL 14 18 13   Creatinine 0.61 - 1.24 mg/dL 1.37(H) 1.49(H) 1.45(H)  Sodium 135 - 145 mmol/L 139 141 145(H)  Potassium 3.5 - 5.1 mmol/L 3.1(L) 3.6 3.8  Chloride 98 - 111 mmol/L 104 108 106  CO2 22 - 32 mmol/L 26 25 24   Calcium 8.9 - 10.3 mg/dL 8.7(L) 8.8(L) 9.0  Total Protein 6.5 - 8.1 g/dL 7.2 7.2 -  Total Bilirubin 0.3 - 1.2 mg/dL 1.0 1.1 -  Alkaline Phos 38 - 126 U/L 65 74 -  AST 15 - 41 U/L 15 15 -  ALT 0 - 44 U/L 7 11 -      Imaging: Radiology review:  CT CHEST, ABDOMEN AND PELVIS  WITHOUT CONTRAST   TECHNIQUE: Multidetector CT imaging of the chest, abdomen and pelvis was performed following the standard protocol without IV contrast.   COMPARISON:  CT the chest, abdomen and pelvis 08/07/2018.   FINDINGS: CT CHEST FINDINGS   Cardiovascular: Heart size is mildly enlarged. There is no significant pericardial fluid, thickening or pericardial calcification. There is aortic atherosclerosis, as well as atherosclerosis of the great vessels of the mediastinum and the coronary arteries, including calcified atherosclerotic plaque in the left main, left anterior descending, left circumflex and right coronary arteries. Mild calcifications of the aortic  valve.   Mediastinum/Nodes: No pathologically enlarged mediastinal or hilar lymph nodes. Please note that accurate exclusion of hilar adenopathy is limited on noncontrast CT scans. Esophagus is unremarkable in appearance. No axillary lymphadenopathy.   Lungs/Pleura: 5 mm right upper lobe pulmonary nodule (axial image 46 of series 4). 2 mm left upper lobe pulmonary nodule (axial image 25 of series 4). No other larger more suspicious appearing pulmonary nodules or masses are noted. No acute consolidative airspace disease. No pleural effusions.   Musculoskeletal: There are no aggressive appearing lytic or blastic lesions noted in the visualized portions of the skeleton.   CT ABDOMEN PELVIS FINDINGS   Hepatobiliary: No definite suspicious cystic or solid hepatic lesions are confidently identified on today's noncontrast CT examination. Unenhanced appearance of the gallbladder is normal.   Pancreas: No definite pancreatic mass or peripancreatic fluid collections or inflammatory changes are noted on today's noncontrast CT examination.   Spleen: Unremarkable.   Adrenals/Urinary Tract: Multiple low-attenuation lesions in both kidneys, similar to the prior study but incompletely characterized on today's non-contrast CT  examination, but statistically likely to represent cysts, largest of which measures 3.6 cm in the interpolar region of the right kidney. Bilateral adrenal glands are normal in appearance. No hydroureteronephrosis. Urinary bladder is nearly decompressed, but otherwise unremarkable in appearance.   Stomach/Bowel: Unenhanced appearance of the stomach is normal. No pathologic dilatation of small bowel or colon. Status post appendectomy.   Vascular/Lymphatic: Aortic atherosclerosis. No lymphadenopathy noted in the abdomen or pelvis.   Reproductive: Prostate gland and seminal vesicles are unremarkable in appearance. Bilateral hydroceles incompletely imaged.   Other: No significant volume of ascites.  No pneumoperitoneum.   Musculoskeletal: There are no aggressive appearing lytic or blastic lesions noted in the visualized portions of the skeleton.   IMPRESSION: 1. No definitive evidence to suggest metastatic disease in the chest, abdomen or pelvis. 2. There are small pulmonary nodules in the lungs measuring 5 mm or less in size, as above, nonspecific but statistically likely benign. Attention at time of routine follow-up imaging is recommended to ensure stability or resolution. 3. Aortic atherosclerosis, in addition to left main and 3 vessel coronary artery disease. Assessment for potential risk factor modification, dietary therapy or pharmacologic therapy may be warranted, if clinically indicated. 4. There are calcifications of the aortic valve. Echocardiographic correlation for evaluation of potential valvular dysfunction may be warranted if clinically indicated. 5. Mild cardiomegaly.     Electronically Signed   By: Vinnie Langton M.D.   On: 08/17/2019 10:47 Within last 24 hrs: No results found.  Assessment    Left inguinal hernia Patient Active Problem List   Diagnosis Date Noted   Anemia in chronic kidney disease 04/08/2019   Stage 3a chronic kidney disease ([place])  04/08/2019   Hyperlipidemia 04/28/2018   Blistering of skin 01/23/2018   Leg wound, left, initial encounter 01/02/2018   Decreased pedal pulses 01/02/2018   Onychomycosis 01/02/2018   Leukopenia 09/28/2017   Thrombocytopenia ([place]) 09/28/2017   B12 deficiency 09/28/2017   Renal mass 09/10/2017   Diabetes ([place]) 09/10/2017   Leg wound, right, initial encounter 09/10/2017   Anemia 09/12/2015   Effort angina ([place]) 06/09/2015   Coronary artery disease involving native coronary artery of native heart without angina pectoris    Hypertensive heart disease    CAD (coronary artery disease)    Mixed Ischemic and Non-ischemic Cardiomyopathy    Chronic systolic heart failure ([place])    Bilateral lower extremity edema 02/10/2015   Abdominal pain  02/10/2015   Cough 02/10/2015   Neuropathy ([place]) 09/16/2014   Tubular adenoma of colon 07/20/2014   History of colon cancer 07/20/2014   Benign colon polyp 07/20/2014   Hypertension 07/20/2014   Hyperplastic colon polyp 07/20/2014    Plan     We will pursue cardiology clearance prior to considering elective surgery.  We discussed robotic repair of left inguinal hernia.   It seems that despite the fact he is tolerating the presence of this hernia well, he appears desiring to pursue its repair.   We discussed at length the risks of anesthesia, versus that of continuing to observe his hernia.  We discussed the elective nature of his repair and the elements that would cause it to be more urgent/emergent.  I discussed possibility of incarceration, strangulation, enlargement in size over time, and the need for emergency surgery in the face of these.  Also reviewed the techniques of reduction should incarceration occur, and when unsuccessful to present to the ED.  Also discussed that surgery risks include recurrence which can be up to 30% in the case of complex hernias, use of prosthetic materials (mesh) and the increased risk of infection and the possible  need for re-operation and removal of mesh, possibility of post-op SBO or ileus, and the risks of general anesthetic including heart attack, stroke, sudden death or some reaction to anesthetic medications. The patient, and those present, appear to understand the risks, any and all questions were answered to the patient's satisfaction.  No guarantees were ever expressed or implied.    Face-to-face time spent with the patient and accompanying care providers(if present) was 50 minutes, with more than 50% of the time spent counseling, educating, and coordinating care of the patient.    These notes generated with voice recognition software. I apologize for typographical errors.  Ronny Bacon M.D., FACS 08/23/2020, 11:32 AM

## 2020-08-23 NOTE — Telephone Encounter (Signed)
Patient has been advised of Pre-Admission date/time, COVID Testing date and Surgery date.  Surgery Date: 09/07/20 Preadmission Testing Date: 08/31/20 (phone 8a-1p) Covid Testing Date: Not needed.     Patient has been made aware to call [phone], between 1-3:00pm the day before surgery, to find out what time to arrive for surgery.

## 2020-08-23 NOTE — Telephone Encounter (Signed)
Called patient no voicemail if patient calls back please schedule surgical clearance appointment not seen in 2 years.

## 2020-08-23 NOTE — Telephone Encounter (Signed)
Attempted to reach patient on his home phone and wife's cell phone, left message on home phone and unable to leave message on wife's cell phone.   I was going to offer the patient and appt on Thursday 08/25/20 at 9:00 am with Dr Fletcher Anon.   I will await a call back from the patient or his spouse.

## 2020-08-23 NOTE — Addendum Note (Signed)
Addended by: Ronny Bacon on: 08/23/2020 12:44 PM   Modules accepted: Level of Service

## 2020-08-23 NOTE — Telephone Encounter (Signed)
Left a message for patient to call me so we can discuss some surgical dates with him.

## 2020-08-23 NOTE — Telephone Encounter (Signed)
Cardiac Clearance faxed to [place] [phone].   Medical Clearance faxed to [employer] [phone].

## 2020-08-24 ENCOUNTER — Inpatient Hospital Stay (HOSPITAL_BASED_OUTPATIENT_CLINIC_OR_DEPARTMENT_OTHER): Payer: PPO | Admitting: Oncology

## 2020-08-24 ENCOUNTER — Telehealth: Payer: Self-pay

## 2020-08-24 ENCOUNTER — Other Ambulatory Visit: Payer: Self-pay

## 2020-08-24 VITALS — BP 113/50 | HR 65 | Temp 97.8°F | Wt 157.0 lb

## 2020-08-24 DIAGNOSIS — Z85038 Personal history of other malignant neoplasm of large intestine: Secondary | ICD-10-CM | POA: Diagnosis not present

## 2020-08-24 DIAGNOSIS — N401 Enlarged prostate with lower urinary tract symptoms: Secondary | ICD-10-CM

## 2020-08-24 DIAGNOSIS — D61818 Other pancytopenia: Secondary | ICD-10-CM | POA: Diagnosis not present

## 2020-08-24 DIAGNOSIS — N138 Other obstructive and reflux uropathy: Secondary | ICD-10-CM

## 2020-08-24 DIAGNOSIS — E538 Deficiency of other specified B group vitamins: Secondary | ICD-10-CM | POA: Diagnosis not present

## 2020-08-24 MED ORDER — AMOXICILLIN-POT CLAVULANATE 875-125 MG PO TABS: 1.0000 | ORAL_TABLET | Freq: Two times a day (BID) | ORAL | 0 refills | Status: AC

## 2020-08-24 MED ORDER — POTASSIUM CHLORIDE CRYS ER 20 MEQ PO TBCR: 20.0000 meq | EXTENDED_RELEASE_TABLET | Freq: Two times a day (BID) | ORAL | 0 refills | Status: DC

## 2020-08-24 NOTE — Telephone Encounter (Signed)
Please continue to follow-up with him.  He needs to be seen for surgical clearance to be completed.

## 2020-08-24 NOTE — Telephone Encounter (Signed)
I called and LVM for the patient to call back and schedule a visit with the provider for medical clearance.  [name],cma

## 2020-08-24 NOTE — Progress Notes (Signed)
[facility]  Chief Complaint: [name]. is a 75 y.o. male with a history of stage IIIC colon cancer, pancytopenia, and B12 deficiency who presented to follow-up for management of history of colon cancer and vitamin B12 deficiency.   PERTINENT ONCOLOGY HISTORY Patient previous oncology care was with Dr. Mike Gip who has moved practice to [facility] Patient switches care to Dr Tasia Catchings on 02/10/2018  # stage IIIC colon cancer.  He presented with symptomatic anemia.  He underwent descending and proximal sigmoid colectomy, left ureteral lysis, and partial cecectomy on 06/23/2013. Six of 22 lymph nodes were positive.  There were tumor deposits (discontinuous extramural extension).  There was lymphovascular and perineural invasion. Pathologic stage was IIIC (T4bN2a M0). He received FOLFOX chemotherapy from 07/22/2013 until 01/06/2014.    Chest, abdomen, and pelvic CT on 01/12/2016 revealed no evidence of recurrent or metastatic carcinoma.  Abdomen and pelvic CT with and without contrast on 09/13/2017 revealed no multiple simple renal cysts.  Liver was unremarkable.  Colonoscopy on 04/12/2014 revealed several polyps.  There was no dysplasia or malignancy.  Colonoscopy on 12/11/2016 revealed 10 polyps (2-8 mm) in the ascending proximal transverse, descending, proximal sigmoid and rectum.  Pathology revealed multiple tubular adenomas without dysplasia or malignancy.  He was diagnosed with pyelonephritis.  He is undergoing evaluation for hematuria.  CT scan was negative.  Cystoscopy on 10/03/2017 revealed prostate enlargement and no evidence of bladder pathology.  # He has a mild pancytopenia.  He received a course of Septra (08/26/2017 - 09/02/2017).  He has a history of a mild normocytic anemia.  Diet is modest.  He denies herbal products.  He denies any melena or hematochezia.  Work-up on 09/27/2017 revealed a hematocrit 31.1, hemoglobin 10.6, MCV 94, platelets 141,000,  WBC 2700 with an ANC of 1600.  Creatinine was 1.32.  He has B12 deficiency.  B12 was low (149).  Anti-parietal cell antibody and intrinsic factor antibody were negative.   Negative studies included:  folate (12.3), TSH, hepatitis B core antibody total, and hepatitis C antibody. Retic was 1.8%.  Ferritin was 103 with a iron saturation of 25% and a TIBC of 201.   Creatinine was 1.32.  He has B12 deficiency.  B12 was 149 on 09/27/2017 then 322 on 11/04/2017 and 227 on 12/05/2017 on oral B12.  Echocardiogram revealed an EF was 20-25% on 02/18/2015.  Cardiac catheterization on 02/24/2015 revealed 80% occlusion of the proximal left circumflex.  He managed medically.   INTERVAL HISTORY [name] presents today for follow-up and management of his colon cancer and vitamin B12 deficiency.  He was last seen in clinic on 08/19/2019 by Dr. Tasia Catchings.  Since his last visit, he has done well.  He recently met with Dr. Christian [name] for left inguinal hernia.  He received cardiac clearance and plan to have surgery in the next couple weeks.  He reports feeling well and denies really any acute symptoms.  He received 6 B12 injections last given on 02/10/2020.   Past Medical History:  Diagnosis Date   Acute renal failure (ARF) ([place]) 08/14/2017   Anemia    CAD (coronary artery disease)    a. 02/2015 Cath: LM nl, LAD 50/40 mid/distal, LCX 80p, RCA 40p, 30d, RPL1 40, CO 3.27, CI 1.65; b. cath 06/09/15: LM minor irregs, m-dLAD 50%, dLAD 40%, pLCx 80% s/p PCI/DES 0%, pRCA 40%, dRCA 30%, 1st RPLB 96%   Chronic systolic CHF (congestive heart failure) ([place])    a. 02/2015  Echo: EF 20-25%, diff HK, mod MR, mildy dil LA, mildly dil RV w/ mod RV syst dysfxn, mildly dil RA, mod TR, PASP 85mHg; b. 09/2017 Echo: EF 40-45%, diff HK. Gr1 DD. Mild to mod MR. Mildly to mod dil LA.   [place]    a. 2015 s/p colectomy followed by chemoRx with oxaliplatin & fluorouacil.   Dia hypertension 07/20/2014   Di[place] mellitus without complication  [place]    Hypertension    Hypertensive heart disease    Mixed Ischemic and Non-ischemic Cardiomyopathy    a. 02/2015 Echo: EF 20-25%, diff HK; b. 09/2016 Echo: EF 40-45%.   Moderate mitral regurgitation    a. 09/2017 Echo: Mild to mod TR.   Tubular adenoma of colon     Past Surgical History:  Procedure Laterality Date   CARDIAC CATHETERIZATION Bilateral 02/24/2015   Procedure: Right/Left Heart Cath and Coronary Angiography;  Surgeon: MWellington Hampshire MD;  [facility];  Service: Cardiovascular;  Laterality: Bilateral;   CARDIAC CATHETERIZATION N/A 06/09/2015   Procedure: Coronary Stent Intervention;  Surgeon: MWellington Hampshire MD;  [facility];  Service: Cardiovascular;  Laterality: N/A;   COLON SURGERY     Colectomy   COLONOSCOPY     COLONOSCOPY WITH PROPOFOL N/A 12/11/2016   Procedure: COLONOSCOPY WITH PROPOFOL;  Surgeon: SLollie Sails MD;  [facility];  Service: Endoscopy;  Laterality: N/A;   ESOPHAGOGASTRODUODENOSCOPY     PORT-A-CATH REMOVAL N/A 07/12/2014   Procedure: REMOVAL PORT-A-CATH;  Surgeon: WMolly Maduro MD;  Location: [facility];  Service: General;  Laterality: N/A;   PORTACATH PLACEMENT Left    Family History  Problem Relation Age of Onset   Cancer Mother    Social History   Socioeconomic History   Marital status: Married    Spouse name: Not on file   Number of children: Not on file   Years of education: Not on file   Highest education level: Not on file  Occupational History   Not on file  Tobacco Use   Smoking status: Never   Smokeless tobacco: Never  Vaping Use   Vaping Use: Never used  Substance and Sexual Activity   Alcohol use: No   Drug use: No   Sexual activity: Yes  Other Topics Concern   Not on file  Social History Narrative   Not on file   Social Determinants of Health   Financial Resource Strain: Not on file  Food Insecurity: Not on file  Transportation Needs: Not on file  Physical  Activity: Not on file  Stress: Not on file  Social Connections: Not on file  Intimate Partner Violence: Not on file    Allergies:  Allergies  Allergen Reactions   Shellfish Allergy Other (See Comments)    Pt. instructed by MD to avoid seafood    Current Medications: Current Outpatient Medications  Medication Sig Dispense Refill   amoxicillin-clavulanate (AUGMENTIN) 875-125 MG tablet Take 1 tablet by mouth 2 (two) times daily for 7 days. 14 tablet 0   aspirin EC 81 MG tablet Take 1 tablet (81 mg total) by mouth daily. 90 tablet 3   atorvastatin (LIPITOR) 40 MG tablet TAKE 1 TABLET BY MOUTH EVERY DAY 90 tablet 3   carvedilol (COREG) 3.125 MG tablet TAKE 1 TABLET(3.125 MG) BY MOUTH TWICE DAILY 180 tablet 3   cholecalciferol (VITAMIN D3) 25 MCG (1000 UNIT) tablet Take 1,000 Units by mouth daily.     ENTRESTO 24-26 MG  TAKE 1 TABLET BY MOUTH TWICE DAILY 60 tablet 0   furosemide (LASIX) 40 MG tablet TAKE 1 TABLET BY MOUTH ON MONDAY, WEDNESDAY AND FRIDAY, THEN 1/2 TABLET ON OTHERS DAYS 20 tablet 0   gabapentin (NEURONTIN) 100 MG capsule Take by mouth.     metFORMIN (GLUCOPHAGE) 500 MG tablet TAKE 1 TABLET(500 MG) BY MOUTH TWICE DAILY WITH A MEAL 180 tablet 3   potassium chloride SA (KLOR-CON) 20 MEQ tablet Take 1 tablet (20 mEq total) by mouth 2 (two) times daily for 5 days. 10 tablet 0   spironolactone (ALDACTONE) 25 MG tablet TAKE 1 TABLET(25 MG) BY MOUTH DAILY 30 tablet 0   tamsulosin (FLOMAX) 0.4 MG CAPS capsule TAKE 1 CAPSULE(0.4 MG) BY MOUTH DAILY AFTER SUPPER 30 capsule 11   No current facility-administered medications for this visit.    Performance status (ECOG): 0  Vital Signs: BP (!) 113/50   Pulse 65   Temp 97.8 F (36.6 C) (Oral)   Wt 157 lb (71.2 kg)   SpO2 99%   BMI 23.18 kg/m   Review of Systems  Constitutional: Negative.  Negative for chills, fever, malaise/fatigue and weight loss.  HENT:  Negative for congestion, ear pain and tinnitus.   Eyes: Negative.   Negative for blurred vision and double vision.  Respiratory: Negative.  Negative for cough, sputum production and shortness of breath.   Cardiovascular: Negative.  Negative for chest pain, palpitations and leg swelling.  Gastrointestinal: Negative.  Negative for abdominal pain, constipation, diarrhea, nausea and vomiting.  Genitourinary:  Negative for dysuria, frequency and urgency.  Musculoskeletal:  Negative for back pain and falls.  Skin: Negative.  Negative for rash.  Neurological: Negative.  Negative for weakness and headaches.  Endo/Heme/Allergies: Negative.  Does not bruise/bleed easily.  Psychiatric/Behavioral: Negative.  Negative for depression. The patient is not nervous/anxious and does not have insomnia.    Physical Exam Constitutional:      Appearance: Normal appearance.  HENT:     Head: Normocephalic and atraumatic.  Eyes:     Pupils: Pupils are equal, round, and reactive to light.  Cardiovascular:     Rate and Rhythm: Normal rate and regular rhythm.     Heart sounds: Normal heart sounds. No murmur heard. Pulmonary:     Effort: Pulmonary effort is normal.     Breath sounds: Normal breath sounds. No wheezing.  Abdominal:     General: Bowel sounds are normal. There is no distension.     Palpations: Abdomen is soft.     Tenderness: There is no abdominal tenderness.  Musculoskeletal:        General: Normal range of motion.     Cervical back: Normal range of motion.  Skin:    General: Skin is warm and dry.     Findings: No rash.  Neurological:     Mental Status: He is alert and oriented to person, place, and time.  Psychiatric:        Judgment: Judgment normal.    RADIOGRAPHIC STUDIES: I have personally reviewed the radiological images as listed and agreed with the findings in the report. No results found.    Labs CBC Latest Ref Rng & Units 08/22/2020 08/19/2019 02/17/2019  WBC 4.0 - 10.5 K/uL 4.1 3.2(L) 3.0(L)  Hemoglobin 13.0 - 17.0 g/dL 10.7(L) 12.2(L)  11.6(L)  Hematocrit 39.0 - 52.0 % 31.0(L) 36.1(L) 34.7(L)  Platelets 150 - 400 K/uL 114(L) 132(L) 104(L)   CMP Latest Ref Rng & Units 08/22/2020 08/19/2019 08/11/2019  Glucose  70 - 99 mg/dL 163(H) 163(H) 116(H)  BUN 8 - 23 mg/dL _0 Creatinine 0.61 - 1.24 mg/dL 1.37(H) 1.49(H) 1.45(H)  Sodium 135 - 145 mmol/L 139 141 145(H)  Potassium 3.5 - 5.1 mmol/L 3.1(L) 3.6 3.8  Chloride 98 - 111 mmol/L 104 108 106  CO2 22 - 32 mmol/L _1 Calcium 8.9 - 10.3 mg/dL 8.7(L) 8.8(L) 9.0  Total Protein 6.5 - 8.1 g/dL 7.2 7.2 -  Total Bilirubin 0.3 - 1.2 mg/dL 1.0 1.1 -  Alkaline Phos 38 - 126 U/L 65 74 -  AST 15 - 41 U/L 15 15 -  ALT 0 - 44 U/L 7 11 -   CEA has been followed:  4.3 on 06/20/2013, 1.8 on 01/06/2014, 1.5 on 09/16/2014, 1.1 on 01/14/2015, 1.5 on 05/16/2015, 1.6 on 09/12/2015, 1.3 on 01/20/2016, 1.6 on 08/24/2016, 1.2 on 01/09/2017, and 1.1 on 09/27/2017.  Assessment:  [name]. is a 76 y.o. male follows up for management of pancytopenia, vitamin B12 deficiency and stage IIIc colon cancer.  1. B12 deficiency   2. Other pancytopenia ([place])    Stage IIIC colon cancer:  Clinically patient is doing well.  CEA has been very stable.  Continue follow-up with gastroenterology.  Most recent CT scan from 08/17/2019 showed no evidence of recurrence.  Patient needs repeat in 1 to 2 weeks.  Anemia/thrombocytopenia/leukopenia- This is been a chronic problem for him. Lab work from today shows hemoglobin 10.7, white count 4.1, platelet count of 114,000. B12 is 187. Recommend restarting B12 injections x6.  Hypokalemia- Likely secondary to diuretics We will start him on 20 mEq potassium twice daily x5 days. New Rx sent.  Dispostion:   Monthly B12 injections x6.  CT CAP in 1 to 2 weeks.  RTC in 2- 3 months for follow-up assessment.   Check CBC, CMP, CEA, vitamin B12 at next appointment.   I spent 25 minutes dedicated to the care of this patient (face-to-face and non-face-to-face)  on the date of the encounter to include what is described in the assessment and plan.  The patient's diagnosis, an outline of the further diagnostic and laboratory studies which will be required, the recommendation for surgery, and alternatives were discussed with her and her accompanying family members.  All questions were answered to their satisfaction.  I personally had a face to face interaction and evaluated the patient jointly with the NP Student, Mrs. Benedetto Goad.  I have reviewed her history and available records and have performed the key portions of the physical exam including general, HEENT, abdominal exam, pelvic exam with my findings confirming those documented above by the APP student.  I have discussed the case with the APP student and the patient.  I agree with the above documentation, assessment and plan which was fully formulated by me.  Counseling was completed by me.    Benedetto Goad, Student FNP  Faythe Casa, NP 09/01/2020 1:42 PM

## 2020-08-24 NOTE — Telephone Encounter (Signed)
-----   Message from Debroah Loop, [place] sent at 08/23/2020  5:32 PM EDT ----- Augmentin BID x7 days and lab visit with repeat UA in 7-10 days please. ----- Message ----- From: Interface, [facility] Lab Results In Sent: 08/17/2020   4:48 PM EDT To: Nori Riis, PA-C

## 2020-08-24 NOTE — Telephone Encounter (Signed)
[facility] notifying patient of info below. Appt made for repeat UA for 7/29. Advised patient to call back should he have any questions.

## 2020-08-25 NOTE — Telephone Encounter (Signed)
Called and LVM for patient o call and schedule to be seen.  Alex Mcmanigal,cma

## 2020-08-26 ENCOUNTER — Other Ambulatory Visit: Payer: Self-pay

## 2020-08-26 ENCOUNTER — Telehealth: Payer: Self-pay

## 2020-08-26 ENCOUNTER — Ambulatory Visit (INDEPENDENT_AMBULATORY_CARE_PROVIDER_SITE_OTHER): Payer: PPO | Admitting: Cardiovascular Disease

## 2020-08-26 ENCOUNTER — Encounter: Payer: Self-pay | Admitting: Cardiovascular Disease

## 2020-08-26 VITALS — BP 110/50 | HR 66 | Ht 69.0 in | Wt 158.0 lb

## 2020-08-26 DIAGNOSIS — I2510 Atherosclerotic heart disease of native coronary artery without angina pectoris: Secondary | ICD-10-CM

## 2020-08-26 DIAGNOSIS — I10 Essential (primary) hypertension: Secondary | ICD-10-CM | POA: Diagnosis not present

## 2020-08-26 DIAGNOSIS — I5022 Chronic systolic (congestive) heart failure: Secondary | ICD-10-CM | POA: Diagnosis not present

## 2020-08-26 DIAGNOSIS — I493 Ventricular premature depolarization: Secondary | ICD-10-CM

## 2020-08-26 DIAGNOSIS — Z01810 Encounter for preprocedural cardiovascular examination: Secondary | ICD-10-CM | POA: Diagnosis not present

## 2020-08-26 MED ORDER — FUROSEMIDE 20 MG PO TABS: 20.0000 mg | ORAL_TABLET | Freq: Every day | ORAL | 6 refills | Status: DC

## 2020-08-26 NOTE — Telephone Encounter (Signed)
Hey, He just had CT scan and it looked good.   Faythe Casa, NP 08/26/2020 5:57 PM

## 2020-08-26 NOTE — Telephone Encounter (Signed)
He was seen on 7/20 not 7/30

## 2020-08-26 NOTE — Telephone Encounter (Signed)
Patient was seen by Rulon Abide on 7/30 and is scheduled to return to clinic to see you in 3 months. Do you want to keep appts as is or make any changes?   Checkout on 7/20:  Monthly Vitamin B12 injections x6.   RTC in 3 months for follow-up assessment (Dr Tasia Catchings), labs (cbc, cmp, cea, B-12)

## 2020-08-26 NOTE — Patient Instructions (Addendum)
Medication Instructions:   Decrease Furosemide 40 mg tablet to Furosemide 20 mg daily. *If you need a refill on your cardiac medications before your next appointment, please call your pharmacy*   Lab Work:  NONE  If you have labs (blood work) drawn today and your tests are completely normal, you will receive your results only by: [place] (if you have MyChart) OR A paper copy in the mail If you have any lab test that is abnormal or we need to change your treatment, we will call you to review the results.   Testing/Procedures:  NONE   Follow-Up: At [facility], you and your health needs are our priority.  As part of our continuing mission to provide you with exceptional heart care, we have created designated Provider Care Teams.  These Care Teams include your primary Cardiologist (physician) and Advanced Practice Providers (APPs -  Physician Assistants and Nurse Practitioners) who all work together to provide you with the care you need, when you need it.  We recommend signing up for the patient portal called "MyChart".  Sign up information is provided on this After Visit Summary.  MyChart is used to connect with patients for Virtual Visits (Telemedicine).  Patients are able to view lab/test results, encounter notes, upcoming appointments, etc.  Non-urgent messages can be sent to your provider as well.   To learn more about what you can do with MyChart, go to [email or web address].    Your next appointment:   6 month(s)  The format for your next appointment:   In Person  Provider:   You may see Kathlyn [place], MD or one of the following Advanced Practice Providers on your designated Care Team:   Murray Hodgkins, NP Christell Faith, PA-C Marrianne Mood, PA-C Cadence [place], [place]

## 2020-08-26 NOTE — Telephone Encounter (Signed)
-----   Message from Earlie Server, MD sent at 08/25/2020  8:54 PM EDT ----- Looks like my labs are pulled? He also seems to have lost follow up.  Reschedule MD visit please 1-2 weeks.

## 2020-08-26 NOTE — Progress Notes (Signed)
Cardiology Office Note   Date:  08/26/2020   ID:  Christopher Hagee., DOB Jun 07, 1945, MRN [id number]  PCP:  Leone Haven, MD  Cardiologist:   Kathlyn Sacramento, MD   Chief Complaint  [name] presents with   OTHER    OD 6 month f/u no complaints today. Meds reviewed verbally with pt.      History of Present Illness: Christopher Dudak. is a 75 y.o. male who presents for a follow-up visit regarding chronic systolic heart failure and coronary artery disease. He has known history of colon cancer status post colectomy in 2015 followed by chemotherapy with oxaliplatin and fluorouracil. He had neuropathy as a result.  He is not a smoker and has not had any alcohol in 10 years. He is not diabetic.   He was diagnosed with acute systolic heart failure in January 2017. Echocardiogram showed severely reduced LV systolic function with an ejection fraction of 20-25%, moderate mitral regurgitation, moderate tricuspid regurgitation with moderate to severe pulmonary hypertension. Right and left cardiac catheterization showed moderate to severe pulmonary hypertension with severely elevated filling pressures with severely reduced cardiac output. Coronary angiography showed severe one-vessel coronary artery disease with 80-90% stenosis in Christopher proximal left circumflex with mild diffuse disease affecting Christopher LAD and RCA. He was started on heart failure medications and subsequently underwent staged PCI of Christopher left circumflex in May 2017.  Repeat echocardiogram in November 2020 showed an EF of 30 to 35%.  He was noted to have PVCs and thus he had a 4-day outpatient monitor which showed sinus rhythm with frequent PACs and frequent PVCs with a burden of 6%. PVCs improved with resumption of carvedilol.    Most recent echocardiogram in August 2021 showed an EF of 25 to 30% with moderately reduced RV systolic function, severe pulmonary hypertension and mild to moderate mitral regurgitation.  In spite of low EF,  [name] has been doing very well and denies any chest pain, shortness of breath or palpitations.  He takes his medications regularly.  He is scheduled for hernia surgery in early August.  He is able to do activities of daily living without significant limitations.  Past Medical History:  Diagnosis Date   Acute renal failure (ARF) ([place]) 08/14/2017   Anemia    CAD (coronary artery disease)    a. 02/2015 Cath: LM nl, LAD 50/40 mid/distal, LCX 80p, RCA 40p, 30d, RPL1 40, CO 3.27, CI 1.65; b. cath 06/09/15: LM minor irregs, m-dLAD 50%, dLAD 40%, pLCx 80% s/p PCI/DES 0%, pRCA 40%, dRCA 30%, 1st RPLB [id number]   Chronic systolic CHF (congestive heart failure) ([place])    a. 02/2015 Echo: EF 20-25%, diff HK, mod MR, mildy dil LA, mildly dil RV w/ mod RV syst dysfxn, mildly dil RA, mod TR, PASP 44mHg; b. 09/2017 Echo: EF 40-45%, diff HK. Gr1 DD. Mild to mod MR. Mildly to mod dil LA.   Colon cancer ([place]    a. 2015 s/p colectomy followed by chemoRx with oxaliplatin & fluorouacil.   Dia hypertension 07/20/2014   Diabetes mellitus without complication ([place]    Hypertension    Hypertensive heart disease    Mixed Ischemic and Non-ischemic Cardiomyopathy    a. 02/2015 Echo: EF 20-25%, diff HK; b. 09/2016 Echo: EF 40-45%.   Moderate mitral regurgitation    a. 09/2017 Echo: Mild to mod TR.   Tubular adenoma of colon     Past Surgical History:  Procedure Laterality Date   CARDIAC  CATHETERIZATION Bilateral 02/24/2015   Procedure: Right/Left Heart Cath and Coronary Angiography;  Surgeon: Wellington Hampshire, MD;  Location: [facility];  Service: Cardiovascular;  Laterality: Bilateral;   CARDIAC CATHETERIZATION N/A 06/09/2015   Procedure: Coronary Stent Intervention;  Surgeon: Wellington Hampshire, MD;  Location: [facility];  Service: Cardiovascular;  Laterality: N/A;   COLON SURGERY     Colectomy   COLONOSCOPY     COLONOSCOPY WITH PROPOFOL N/A 12/11/2016   Procedure: COLONOSCOPY WITH PROPOFOL;  Surgeon:  Lollie Sails, MD;  Location: [facility] ENDOS[facility];  Service: Endoscopy;  Laterality: N/A;   ESOPHAGOGASTRODUODENOSCOPY     PORT-A-CATH REMOVAL N/A 07/12/2014   Procedure: REMOVAL PORT-A-CATH;  Surgeon: Molly Maduro, MD;  Location: [facility] ORS;  Service: General;  Laterality: N/A;   PORTACATH PLACEMENT Left      Current Outpatient Medications  Medication Sig Dispense Refill   amoxicillin-clavulanate (AUGMENTIN) 875-125 MG tablet Take 1 tablet by mouth 2 (two) times daily for 7 days. 14 tablet 0   aspirin EC 81 MG tablet Take 1 tablet (81 mg total) by mouth daily. 90 tablet 3   atorvastatin (LIPITOR) 40 MG tablet TAKE 1 TABLET BY MOUTH EVERY DAY 90 tablet 3   carvedilol (COREG) 3.125 MG tablet TAKE 1 TABLET(3.125 MG) BY MOUTH TWICE DAILY 180 tablet 3   cholecalciferol (VITAMIN D3) 25 MCG (1000 UNIT) tablet Take 1,000 Units by mouth daily.     ENTRESTO 24-26 MG TAKE 1 TABLET BY MOUTH TWICE DAILY 60 tablet 0   furosemide (LASIX) 40 MG tablet TAKE 1 TABLET BY MOUTH ON MONDAY, WEDNESDAY AND FRIDAY, THEN 1/2 TABLET ON OTHERS DAYS 20 tablet 0   gabapentin (NEURONTIN) 100 MG capsule Take by mouth.     metFORMIN (GLUCOPHAGE) 500 MG tablet TAKE 1 TABLET(500 MG) BY MOUTH TWICE DAILY WITH A MEAL 180 tablet 3   potassium chloride SA (KLOR-CON) 20 MEQ tablet Take 1 tablet (20 mEq total) by mouth 2 (two) times daily for 5 days. 10 tablet 0   spironolactone (ALDACTONE) 25 MG tablet TAKE 1 TABLET(25 MG) BY MOUTH DAILY 30 tablet 0   tamsulosin (FLOMAX) 0.4 MG CAPS capsule TAKE 1 CAPSULE(0.4 MG) BY MOUTH DAILY AFTER SUPPER 30 capsule 11   No current facility-administered medications for this visit.    Allergies:   Shellfish allergy    Social History:  [name]  reports that he has never smoked. He has never used smokeless tobacco. He reports that he does not drink alcohol and does not use drugs.   Family History:  [name]'s family history includes Cancer in his mother.    ROS:  Please see Christopher  history of present illness.   Otherwise, review of systems are positive for none.   All other systems are reviewed and negative.    PHYSICAL EXAM: VS:  BP (!) 110/50 (BP Location: Left Arm, [name] Position: Sitting, Cuff Size: Normal)   Pulse 66   Ht '5\' 9"'$  (1.753 m)   Wt 158 lb (71.7 kg)   SpO2 95%   BMI 23.33 kg/m  , BMI Body mass index is 23.33 kg/m. GEN: Well nourished, well developed, in no acute distress  HEENT: normal  Neck: no JVD, carotid bruits, or masses Cardiac: RRR; no murmurs, rubs, or gallops,no edema  Respiratory:  clear to auscultation bilaterally, normal work of breathing GI: soft, nontender, nondistended, + BS MS: no deformity or atrophy  Skin: warm and dry, no rash Neuro:  Strength and sensation  are intact Psych: euthymic mood, full affect   EKG:  EKG is ordered today.   EKG shows sinus rhythm with sinus arrhythmia and nonspecific T wave changes.   Recent Labs: 08/22/2020: ALT 7; BUN 14; Creatinine, Ser 1.37; Hemoglobin 10.7; Platelets 114; Potassium 3.1; Sodium 139    Lipid Panel    Component Value Date/Time   CHOL 110 12/30/2017 1058   CHOL 143 08/02/2016 1110   TRIG 35 12/30/2017 1058   HDL 50 12/30/2017 1058   HDL 69 08/02/2016 1110   CHOLHDL 2.2 12/30/2017 1058   VLDL 7 12/30/2017 1058   LDLCALC 53 12/30/2017 1058   LDLCALC 64 08/02/2016 1110      Wt Readings from Last 3 Encounters:  08/26/20 158 lb (71.7 kg)  08/24/20 157 lb (71.2 kg)  08/23/20 156 lb 12.8 oz (71.1 kg)        ASSESSMENT AND PLAN:  1.  Coronary artery disease involving native coronary arteries without angina:  He is status post PCI and drug-eluting stent placement to Christopher left circumflex  : He is doing well overall.  Continue medical therapy.  2. Chronic systolic heart failure:  He appears to be euvolemic . He is currently New York Heart Association class II.  I elected to decrease furosemide to 20 mg once daily.    Continue treatment with carvedilol, Entresto and  spironolactone.  I reviewed his recent labs. Given EF below 35%, I discussed with him Christopher indication for an ICD placement but [name] is not interested.  3. Essential hypertension: Blood pressure is reasonably controlled.  4. Hyperlipidemia: Continue treatment with atorvastatin.  Most recent LDL was 53.  5.  Frequent PVCs: Significant improvement with carvedilol which will be continued.  6.  Preop cardiovascular evaluation for hernia surgery: In spite of low EF and known history of coronary artery disease, [name] seems to be well compensated and has very good functional capacity greater than 4 METS.  He can proceed with surgery at an overall moderate risk.   Disposition:   FU with me in 6 months  Signed,  Kathlyn Sacramento, MD  08/26/2020 9:49 AM    [place]

## 2020-08-26 NOTE — Telephone Encounter (Signed)
Patient called back and I informed [name] to schedule him an appointment to see the provider for his surgical clearance and he is scheduled.  Charmin Aguiniga,cma

## 2020-08-26 NOTE — Telephone Encounter (Deleted)
Please schedule patient to see MD in 1-2 weeks please and notify pt of appts.

## 2020-08-27 ENCOUNTER — Other Ambulatory Visit: Payer: Self-pay | Admitting: Oncology

## 2020-08-27 DIAGNOSIS — C187 Malignant neoplasm of sigmoid colon: Secondary | ICD-10-CM

## 2020-08-27 NOTE — Telephone Encounter (Signed)
Nope. You are right. Orders in and schedule message sent. Thanks. Sorry about that. Not sure how I missed it.   Faythe Casa, NP 08/27/2020 7:36 AM

## 2020-08-27 NOTE — Progress Notes (Signed)
Re: Ct scan   Mr. Isobe will need a CT chest abdomen pelvis in the next week or so.  Last CT scan was from 08/17/2019 which was negative for recurrence or metastatic disease in the chest abdomen or pelvis.  Faythe Casa, NP 08/27/2020 7:35 AM

## 2020-08-30 ENCOUNTER — Telehealth: Payer: Self-pay

## 2020-08-30 NOTE — Telephone Encounter (Signed)
Cardiac Clearance received from [facility] cardiovascular evaluation for hernia surgery: In spite of low EF and known history of coronary artery disease, the patient seems to be well compensated and has very good functional capacity greater than 4 METS.  He can proceed with surgery at an overall moderate risk.

## 2020-08-31 ENCOUNTER — Inpatient Hospital Stay
Admission: RE | Admit: 2020-08-31 | Discharge: 2020-08-31 | Disposition: A | Discharge status: Home / Self Care | Payer: PPO | Source: Ambulatory Visit

## 2020-08-31 HISTORY — DX: Chronic kidney disease, stage 3a: N18.31

## 2020-08-31 NOTE — Patient Instructions (Addendum)
Your procedure is scheduled on: Wednesday, August 3 Report to the Registration Desk on the 1st floor of the [facility]. To find out your arrival time, please call [phone] between 1PM - 3PM on: Tuesday, August 2  REMEMBER: Instructions that are not followed completely may result in serious medical risk, up to and including death; or upon the discretion of your surgeon and anesthesiologist your surgery may need to be rescheduled.  Do not eat food after midnight the night before surgery.  No gum chewing, lozengers or hard candies.  You may however, water up to 2 hours before you are scheduled to arrive for your surgery. Do not drink anything within 2 hours of your scheduled arrival time.  TAKE THESE MEDICATIONS THE MORNING OF SURGERY WITH A SIP OF WATER:  Atorvastatin (Lipitor) Carvedilol  Stop Metformin 2 days prior to surgery. Last day to take Metformin is Sunday, July 31. Resume AFTER surgery.  Follow recommendations from Cardiologist, Pulmonologist or PCP regarding stopping Aspirin.  One week prior to surgery: Stop Anti-inflammatories (NSAIDS) such as Advil, Aleve, Ibuprofen, Motrin, Naproxen, Naprosyn and Aspirin based products such as Excedrin, Goodys Powder, BC Powder. Stop ANY OVER THE COUNTER supplements until after surgery. You may however, continue to take Tylenol if needed for pain up until the day of surgery.  No Alcohol for 24 hours before or after surgery.  No Smoking including e-cigarettes for 24 hours prior to surgery.  No chewable tobacco products for at least 6 hours prior to surgery.  No nicotine patches on the day of surgery.  Do not use any "recreational" drugs for at least a week prior to your surgery.  Please be advised that the combination of cocaine and anesthesia may have negative outcomes, up to and including death. If you test positive for cocaine, your surgery will be cancelled.  On the morning of surgery brush your teeth with toothpaste and  water, you may rinse your mouth with mouthwash if you wish. Do not swallow any toothpaste or mouthwash.  Do not wear jewelry, make-up, hairpins, clips or nail polish.  Do not wear lotions, powders, or perfumes.   Do not shave body from the neck down 48 hours prior to surgery just in case you cut yourself which could leave a site for infection.  Also, freshly shaved skin may become irritated if using the CHG soap.  Contact lenses, hearing aids and dentures may not be worn into surgery.  Do not bring valuables to the hospital. [facility] is not responsible for any missing/lost belongings or valuables.   Use CHG Soap as directed on instruction sheet.  Bring your C-PAP to the hospital with you in case you may have to spend the night.   Notify your doctor if there is any change in your medical condition (cold, fever, infection).  Wear comfortable clothing (specific to your surgery type) to the hospital.  After surgery, you can help prevent lung complications by doing breathing exercises.  Take deep breaths and cough every 1-2 hours. Your doctor may order a device called an Incentive Spirometer to help you take deep breaths. When coughing or sneezing, hold a pillow firmly against your incision with both hands. This is called "splinting." Doing this helps protect your incision. It also decreases belly discomfort.  If you are being discharged the day of surgery, you will not be allowed to drive home. You will need a responsible adult (18 years or older) to drive you home and stay with you that night.  If you are taking public transportation, you will need to have a responsible adult (18 years or older) with you. Please confirm with your physician that it is acceptable to use public transportation.   Please call the [facility] at [phone] if you have any questions about these instructions.  Surgery Visitation Policy:  Patients undergoing a surgery or procedure may  have one family member or support person with them as long as that person is not COVID-19 positive or experiencing its symptoms.  That person may remain in the waiting area during the procedure.

## 2020-09-01 ENCOUNTER — Encounter: Payer: Self-pay | Admitting: Hematology and Oncology

## 2020-09-01 ENCOUNTER — Encounter: Payer: Self-pay | Admitting: Family Medicine

## 2020-09-01 ENCOUNTER — Ambulatory Visit (INDEPENDENT_AMBULATORY_CARE_PROVIDER_SITE_OTHER): Payer: PPO | Admitting: Family Medicine

## 2020-09-01 ENCOUNTER — Other Ambulatory Visit: Payer: Self-pay

## 2020-09-01 DIAGNOSIS — K4090 Unilateral inguinal hernia, without obstruction or gangrene, not specified as recurrent: Secondary | ICD-10-CM

## 2020-09-01 DIAGNOSIS — Z85038 Personal history of other malignant neoplasm of large intestine: Secondary | ICD-10-CM

## 2020-09-01 DIAGNOSIS — I2510 Atherosclerotic heart disease of native coronary artery without angina pectoris: Secondary | ICD-10-CM | POA: Diagnosis not present

## 2020-09-01 DIAGNOSIS — E119 Type 2 diabetes mellitus without complications: Secondary | ICD-10-CM

## 2020-09-01 LAB — BASIC METABOLIC PANEL
BUN: 13 mg/dL (ref 6–23)
CO2: 28 mEq/L (ref 19–32)
Calcium: 9.3 mg/dL (ref 8.4–10.5)
Chloride: 104 mEq/L (ref 96–112)
Creatinine, Ser: 1.34 mg/dL (ref 0.40–1.50)
GFR: 52.48 mL/min — ABNORMAL LOW (ref >60.00)
Glucose, Bld: 96 mg/dL (ref 70–99)
Potassium: 4 mEq/L (ref 3.5–5.1)
Sodium: 140 mEq/L (ref 135–145)

## 2020-09-01 LAB — HEMOGLOBIN A1C: Hgb A1c MFr Bld: 6.2 % (ref 4.6–6.5)

## 2020-09-01 MED ORDER — TETANUS-DIPHTHERIA TOXOIDS TD 5-2 LFU IM INJ: 0.5000 mL | INJECTION | Freq: Once | INTRAMUSCULAR | 0 refills | Status: AC

## 2020-09-01 NOTE — Assessment & Plan Note (Addendum)
The patient reports having had a colonoscopy in the last couple of weeks.  I advised that I do not see any record of this.  We will contact [facility] to see if he has had a colonoscopy recently.

## 2020-09-01 NOTE — Patient Instructions (Signed)
Nice to see you. We will get lab work today and contact you with the results. Once your labs return I will determine if you are okay to have surgery.

## 2020-09-01 NOTE — Assessment & Plan Note (Signed)
Undetermined control.  Patient was lost to follow-up over the last 2 years.  He will continue metformin though we will check labs today.  I did encourage him to see an eye doctor on a yearly basis to check the back of his eyes given his diabetes history.  Foot exam completed today.

## 2020-09-01 NOTE — Assessment & Plan Note (Signed)
Chronic issue.  Asymptomatic.  He has seen cardiology recently.  They will continue to manage his carvedilol, Entresto, Lasix, and spironolactone.  Discussed that they said he is moderate risk for his surgery though he was acceptable to proceed.

## 2020-09-01 NOTE — Progress Notes (Signed)
Noted. Thank you for calling him. If he can not confirm the location I would suggest we refer him back to [facility] for follow-up with GI given his colon cancer history.

## 2020-09-01 NOTE — Progress Notes (Signed)
Christopher Rumps, MD Phone: [phone]  [place] [name]. is a 75 y.o. male who presents today for follow-up.  Diabetes: Not checking sugars.  He is taking metformin.  No polyuria or polydipsia.  No hypoglycemia.  He has not seen ophthalmology in the last year.  CAD/cardiomyopathy: Patient is on carvedilol, Entresto, Lasix, and spironolactone.  He notes no chest pain, shortness of breath, edema.  He is able to climb 2 flights of stairs with no issues and he mows his yard with no cardiovascular symptoms.  Inguinal hernia: Patient is scheduled for an inguinal hernia repair next week.  He has already seen cardiology for cardiac clearance.  Social History   Tobacco Use  Smoking Status Never  Smokeless Tobacco Never    Current Outpatient Medications on File Prior to Visit  Medication Sig Dispense Refill   aspirin EC 81 MG tablet Take 1 tablet (81 mg total) by mouth daily. 90 tablet 3   atorvastatin (LIPITOR) 40 MG tablet TAKE 1 TABLET BY MOUTH EVERY DAY 90 tablet 3   carvedilol (COREG) 3.125 MG tablet TAKE 1 TABLET(3.125 MG) BY MOUTH TWICE DAILY 180 tablet 3   cholecalciferol (VITAMIN D3) 25 MCG (1000 UNIT) tablet Take 1,000 Units by mouth daily.     ENTRESTO 24-26 MG TAKE 1 TABLET BY MOUTH TWICE DAILY 60 tablet 0   furosemide (LASIX) 20 MG tablet Take 1 tablet (20 mg total) by mouth daily. 30 tablet 6   gabapentin (NEURONTIN) 100 MG capsule Take by mouth.     metFORMIN (GLUCOPHAGE) 500 MG tablet TAKE 1 TABLET(500 MG) BY MOUTH TWICE DAILY WITH A MEAL 180 tablet 3   spironolactone (ALDACTONE) 25 MG tablet TAKE 1 TABLET(25 MG) BY MOUTH DAILY 30 tablet 0   tamsulosin (FLOMAX) 0.4 MG CAPS capsule TAKE 1 CAPSULE(0.4 MG) BY MOUTH DAILY AFTER SUPPER 30 capsule 11   potassium chloride SA (KLOR-CON) 20 MEQ tablet Take 1 tablet (20 mEq total) by mouth 2 (two) times daily for 5 days. 10 tablet 0   No current facility-administered medications on file prior to visit.     ROS see history of  present illness  Objective  Physical Exam Vitals:   09/01/20 1001  BP: 140/62  Pulse: (!) 51  Temp: 98.1 F (36.7 C)  SpO2: 95%    BP Readings from Last 3 Encounters:  09/01/20 140/62  08/26/20 (!) 110/50  08/24/20 (!) 113/50   Wt Readings from Last 3 Encounters:  09/01/20 155 lb (70.3 kg)  08/26/20 158 lb (71.7 kg)  08/24/20 157 lb (71.2 kg)    Physical Exam Constitutional:      General: He is not in acute distress.    Appearance: He is not diaphoretic.  Cardiovascular:     Rate and Rhythm: Normal rate and regular rhythm.     Heart sounds: Normal heart sounds.  Pulmonary:     Effort: Pulmonary effort is normal.     Breath sounds: Normal breath sounds.  Abdominal:     General: Bowel sounds are normal. There is no distension.     Palpations: Abdomen is soft.     Tenderness: There is no abdominal tenderness. There is no guarding or rebound.  Musculoskeletal:     Right lower leg: No edema.     Left lower leg: No edema.  Skin:    General: Skin is warm and dry.  Neurological:     Mental Status: He is alert.   Diabetic Foot Exam - Simple   Simple  Foot Form Diabetic Foot exam was performed with the following findings: Yes 09/01/2020 10:15 AM  Visual Inspection See comments: Yes Sensation Testing Intact to touch and monofilament testing bilaterally: Yes Pulse Check Posterior Tibialis and Dorsalis pulse intact bilaterally: Yes Comments Bilateral toes with onychomycosis, scattered hyperpigmented lesions on plantar surfaces of his feet bilaterally that the patient notes are chronic and stable      Assessment/Plan: Please see individual problem list.  Problem List Items Addressed This Visit     CAD (coronary artery disease)    Chronic issue.  Asymptomatic.  He has seen cardiology recently.  They will continue to manage his carvedilol, Entresto, Lasix, and spironolactone.  Discussed that they said he is moderate risk for his surgery though he was acceptable to  proceed.       Relevant Orders   Basic Metabolic Panel (BMET)   Diabetes ([place])    Undetermined control.  Patient was lost to follow-up over the last 2 years.  He will continue metformin though we will check labs today.  I did encourage him to see an eye doctor on a yearly basis to check the back of his eyes given his diabetes history.  Foot exam completed today.       Relevant Orders   HgB A1c   History of colon cancer    The patient reports having had a colonoscopy in the last couple of weeks.  I advised that I do not see any record of this.  We will contact [facility] to see if he has had a colonoscopy recently.       Left inguinal hernia    We will determine medical clearance for this procedure once his labs return.         Health Maintenance: the patient will get his tetanus vaccine at the pharmacy.  He declines COVID-vaccine and Shingrix vaccine as well as pneumonia vaccines at this time.  Return in about 6 months (around 03/04/2021) for Diabetes.    This visit occurred during the SARS-CoV-2 public health emergency.  Safety protocols were in place, including screening questions prior to the visit, additional usage of staff PPE, and extensive cleaning of exam room while observing appropriate contact time as indicated for disinfecting solutions.    Christopher Rumps, MD [place]

## 2020-09-01 NOTE — Progress Notes (Signed)
Called [facility]. They stated the last one he had was in 2018. I will call the pt to be sure of location of colonoscopy.

## 2020-09-01 NOTE — Assessment & Plan Note (Addendum)
The patient's A1c returned well-controlled.  His kidney function generally stable.  He is overall moderate risk as his cardiologist noted given his chronic cardiac issues.  Cardiac clearance is to come from his cardiologist.  Medically he is optimized for surgery at this time.

## 2020-09-01 NOTE — Progress Notes (Signed)
Placed call to pt. No vm set up. No vm left

## 2020-09-02 ENCOUNTER — Encounter: Payer: Self-pay | Admitting: Surgery

## 2020-09-02 ENCOUNTER — Other Ambulatory Visit: Payer: PPO

## 2020-09-02 ENCOUNTER — Telehealth: Payer: Self-pay | Admitting: Physician Assistant

## 2020-09-02 ENCOUNTER — Encounter
Admission: RE | Admit: 2020-09-02 | Discharge: 2020-09-02 | Disposition: A | Discharge status: Home / Self Care | Payer: PPO | Source: Ambulatory Visit | Attending: Surgery | Admitting: Surgery

## 2020-09-02 ENCOUNTER — Encounter: Admission: RE | Admit: 2020-09-02 | Payer: PPO | Source: Ambulatory Visit

## 2020-09-02 DIAGNOSIS — N138 Other obstructive and reflux uropathy: Secondary | ICD-10-CM

## 2020-09-02 DIAGNOSIS — N401 Enlarged prostate with lower urinary tract symptoms: Secondary | ICD-10-CM | POA: Diagnosis not present

## 2020-09-02 LAB — URINALYSIS, COMPLETE
Bilirubin, UA: NEGATIVE
Glucose, UA: NEGATIVE
Ketones, UA: NEGATIVE
Leukocytes,UA: NEGATIVE
Nitrite, UA: NEGATIVE
Protein,UA: NEGATIVE
RBC, UA: NEGATIVE
Specific Gravity, UA: 1.02 (ref 1.005–1.030)
Urobilinogen, Ur: 0.2 mg/dL (ref 0.2–1.0)
pH, UA: 5 (ref 5.0–7.5)

## 2020-09-02 LAB — MICROSCOPIC EXAMINATION
Bacteria, UA: NONE SEEN
RBC, Urine: NONE SEEN /hpf (ref 0–2)

## 2020-09-02 NOTE — Telephone Encounter (Signed)
Tried to reach patient, no VM set up. 1st attempt. Made follow up with Larene Beach for 08/23/21.

## 2020-09-02 NOTE — Patient Instructions (Signed)
Your procedure is scheduled on: Wednesday, August 3 Report to the Registration Desk on the 1st floor of the [facility]. To find out your arrival time, please call [phone] between 1PM - 3PM on: Tuesday, August 2  REMEMBER: Instructions that are not followed completely may result in serious medical risk, up to and including death; or upon the discretion of your surgeon and anesthesiologist your surgery may need to be rescheduled.  Do not eat food after midnight the night before surgery.  No gum chewing, lozengers or hard candies.  You may however, drink water up to 2 hours before you are scheduled to arrive for your surgery. Do not drink anything within 2 hours of your scheduled arrival time.  TAKE THESE MEDICATIONS THE MORNING OF SURGERY WITH A SIP OF WATER:  Atorvastatin Carvedilol Gabapentin Tamsulosin (Flomax)  Stop Metformin 2 days prior to surgery. Last day to take Metformin is Sunday July 31. Resume AFTER surgery.  One week prior to surgery: starting July 27 Stop Anti-inflammatories (NSAIDS) such as Advil, Aleve, Ibuprofen, Motrin, Naproxen, Naprosyn and Aspirin based products such as Excedrin, Goodys Powder, BC Powder. Stop ANY OVER THE COUNTER supplements until after surgery. You may however, continue to take Tylenol if needed for pain up until the day of surgery.  No Alcohol for 24 hours before or after surgery.  No Smoking including e-cigarettes for 24 hours prior to surgery.  No chewable tobacco products for at least 6 hours prior to surgery.  No nicotine patches on the day of surgery.  Do not use any "recreational" drugs for at least a week prior to your surgery.  Please be advised that the combination of cocaine and anesthesia may have negative outcomes, up to and including death. If you test positive for cocaine, your surgery will be cancelled.  On the morning of surgery brush your teeth with toothpaste and water, you may rinse your mouth with mouthwash if  you wish. Do not swallow any toothpaste or mouthwash.  Do not wear jewelry, make-up, hairpins, clips or nail polish.  Do not wear lotions, powders, or perfumes.   Do not shave body from the neck down 48 hours prior to surgery just in case you cut yourself which could leave a site for infection.  Also, freshly shaved skin may become irritated if using the CHG soap.  Contact lenses, hearing aids and dentures may not be worn into surgery.  Do not bring valuables to the hospital. [facility] is not responsible for any missing/lost belongings or valuables.   Use CHG Soap as directed on instruction sheet.  Notify your doctor if there is any change in your medical condition (cold, fever, infection).  Wear comfortable clothing (specific to your surgery type) to the hospital.  After surgery, you can help prevent lung complications by doing breathing exercises.  Take deep breaths and cough every 1-2 hours. Your doctor may order a device called an Incentive Spirometer to help you take deep breaths. When coughing or sneezing, hold a pillow firmly against your incision with both hands. This is called "splinting." Doing this helps protect your incision. It also decreases belly discomfort.  If you are being discharged the day of surgery, you will not be allowed to drive home. You will need a responsible adult (18 years or older) to drive you home and stay with you that night.   If you are taking public transportation, you will need to have a responsible adult (18 years or older) with you. Please confirm  with your physician that it is acceptable to use public transportation.   Please call the [place] Dept. at [phone] if you have any questions about these instructions.  Surgery Visitation Policy:  Patients undergoing a surgery or procedure may have one family member or support person with them as long as that person is not COVID-19 positive or experiencing its symptoms.  That  person may remain in the waiting area during the procedure.

## 2020-09-02 NOTE — Progress Notes (Signed)
Perioperative Services  Pre-Admission/Anesthesia Testing Clinical Review  Date: 09/02/20  Patient Demographics:  Name: [name]. DOB:   [birth year] MRN:   [id number]  Planned Surgical Procedure(s):    Case: [id number] Date/Time: 09/07/20 1246   Procedure: XI ROBOTIC ASSISTED INGUINAL HERNIA (Left)   Anesthesia type: General   Pre-op diagnosis: left inguinal hernia   Location: [facility] OR ROOM 06 / [place]   Surgeons: Ronny Bacon, MD     NOTE: Available PAT nursing documentation and vital signs have been reviewed. Clinical nursing staff has updated patient's PMH/PSHx, current medication list, and drug allergies/intolerances to ensure comprehensive history available to assist in medical decision making as it pertains to the aforementioned surgical procedure and anticipated anesthetic course. Extensive review of available clinical information performed. [place] [facility] and PSHx updated with any diagnoses/procedures that  may have been inadvertently omitted during his intake with the pre-admission testing department's nursing staff.  Clinical Discussion:  [name]. is a 75 y.o. male who is submitted for pre-surgical anesthesia review and clearance prior to him undergoing the above procedure. Patient has never been a smoker. Pertinent PMH includes: CAD, systolic CHF, severe PAH, mixed ischemic and nonischemic cardiomyopathy, mitral valve regurgitation, HTN, T2DM, CKD-III, anemia, colon cancer.  Patient is followed by cardiology Fletcher Anon, MD). He was last seen in the cardiology clinic on 08/26/2020; notes reviewed.  At the time of his clinic visit, patient reported to be doing "very well" from a cardiovascular perspective.  He denied any chest pain, shortness of breath, PND, orthopnea, palpitations, significant peripheral edema, vertiginous symptoms, or presyncope/syncope.  PMH significant for cardiovascular diagnoses.  Patient diagnosed with moderately  differentiated invasive adenocarcinoma (stage IIIC) of the colon 06/2013; pathologic stage T4bN2a M0. He underwent descending and proximal sigmoid colectomy, LEFT ureterolysis, partial cecectomy, and appendectomy on 06/23/2013. Patient was treated with adjuvant FOLFOX chemotherapy (07/22/2013-01/06/2014). Of note, chemotherapeutic regimen used in this patient is associated with increased risk of cardiotoxicity.   TTE performed on 02/18/2015 revealed a severely reduced left ventricular systolic function with an EF of 20-25%.  There was diffuse hypokinesis noted.  Left atrium severely dilated.  Right atrium mildly dilated.  There was moderate tricuspid valve regurgitation.  PASP moderately to severely increased with a peak pressure of 63 mmHg.  Diagnostic right/left heart catheterization was performed on 02/24/2015 revealing moderate to severe pulmonary hypertension with severely elevated filling pressures. There was significant one-vessel CAD affecting the proximal LCx with mildly calcified disease affecting the LAD and RCA.  Left ventricular systolic function was severely reduced.  The decision was made to defer PCI allowing for optimization of heart failure.  Repeat cardiac catheterization was performed on 06/09/2015. There was 80% stenosis of the proximal LCx, 40% stenosis of the distal LAD, 50% stenosis of the mid to distal LAD, 40% stenosis of the 1st RPLB, 30% stenosis of the distal RCA, and 40% stenosis of the proximal RCA.  Successful PCI was performed whereby a 3.0 x 12 mm Xience Alpine DES was placed to the LCx resulting in 0% residual stenosis and restoration of TIMI-3 flow.  Long-term cardiac event monitor study performed on 12/24/2018 revealed a predominantly underlying sinus rhythm with an average rate of 66 bpm (range 43-115 bpm).  There was frequent atrial and ventricular ectopy (approximately 6% burden).  There were 2 episodes of NSVT lasting up to 4 beats at a maximum heart rate 158 bpm.   There were 11 atrial runs lasting up to 12  beats with a maximum heart rate of 135 bpm.  There were no sustained arrhythmias or prolonged pauses.  There were no patient triggered events.  Patient has had serial functional monitoring of his cardiovascular status.  Last TTE was performed on 09/09/2019 revealing a severely reduced left ventricular systolic function with an EF of 25-30%.  There was mild to moderate biatrial enlargement.  Additionally, there was mild to moderate mitral valve regurgitation.  The IVC was dilated in size with a less than 50% respiratory variability suggesting right atrial pressure of 15 mmHg. Given HFrEF with EF of < 35%, ICD placement was discussed, however patient was not interested (see full interpretation of cardiovascular testing. and interventions below).  Blood pressure and heart failure symptoms well controlled on currently prescribed beta-blocker, ARB/ARNI, and diuretic therapies.  Patient is on a statin for his HLD.  T2DM well-controlled on currently prescribed regimen; last Hgb A1c was 6.2% when checked on 09/01/2020. Functional capacity, as defined by DASI, is documented as being >/= 4 METS. No changes were made to patient's medication regimen.  Patient to follow-up with outpatient cardiology in 6 months or sooner if needed.  Patient is scheduled for an elective inguinal hernia repair on 09/07/2020 with Dr. Ronny Bacon, MD.  Given patient's past medical history significant for cardiovascular diagnoses and multiple comorbidities, presurgical clearances were sought from both internal/family medicine and cardiology. Specialty clearances were obtained as follows:  Per internal/family medicine Caryl Bis, MD), "patient is medically optimized for surgery at this time.  He is at an overall MODERATE risk given his chronic cardiac issues.  Cardiac clearance is to come from his cardiologist".  Per cardiology Fletcher Anon, MD), "in spite of his low EF and known history of CAD, the  patient seems to be well compensated and has very good functional capacity (>4 METS).  He can proceed with surgery and an overall MODERATE risk".  Patient denies previous perioperative complications with anesthesia in the past. In review of the available records, it is noted that patient underwent a general anesthetic course here (ASA III) in 12/2016 without documented complications.   Vitals with BMI 09/02/2020 09/01/2020 08/26/2020  Height '5\' 9"'$  5' 9.016" '5\' 9"'$   Weight 152 lbs 155 lbs 158 lbs  BMI 22.44 [id number]  Systolic - [id number]  Diastolic - 62 50  Pulse - 51 66    Providers/Specialists:   NOTE: Primary physician provider listed below. Patient may have been seen by APP or partner within same practice.   PROVIDER ROLE / SPECIALTY LAST Katy Apo, MD General Surgery 08/23/2020  Leone Haven, MD Primary Care Provider 09/02/2020  Marlyne Beards, MD Cardiology 08/26/2020  Earlie Server, MD Oncology/Hematology 08/19/2019   Allergies:  Shellfish allergy  Current Home Medications:   No current facility-administered medications for this encounter.    aspirin EC 81 MG tablet   atorvastatin (LIPITOR) 40 MG tablet   carvedilol (COREG) 3.125 MG tablet   cholecalciferol (VITAMIN D3) 25 MCG (1000 UNIT) tablet   ENTRESTO 24-26 MG   furosemide (LASIX) 20 MG tablet   gabapentin (NEURONTIN) 100 MG capsule   metFORMIN (GLUCOPHAGE) 500 MG tablet   spironolactone (ALDACTONE) 25 MG tablet   tamsulosin (FLOMAX) 0.4 MG CAPS capsule   History:   Past Medical History:  Diagnosis Date   Adenocarcinoma of colon ([place]) 06/19/2013   a.) moderately differentiated stage IIIc (T4bN2a M0) adenocarcinoma of colon  s/p colectomy followed by chemoRx with oxaliplatin & fluorouacil.   Anemia  CAD (coronary artery disease)    a. 02/2015 Cath: LM nl, LAD 50/40 mid/distal, LCX 80p, RCA 40p, 30d, RPL1 40, CO 3.27, CI 1.65; b. cath 06/09/15: LM minor irregs, m-dLAD 50%, dLAD 40%, pLCx 80% s/p  PCI/DES 0%, pRCA 40%, dRCA 30%, 1st RPLB 40%   Chronic kidney disease, stage 3a (HCC)    Chronic systolic CHF (congestive heart failure) ([place])    a. 02/2015 Echo: EF 20-25%, diff HK, mod MR, mildy dil LA, mildly dil RV w/ mod RV syst dysfxn, mildly dil RA, mod TR, PASP 35mHg; b. 09/2017 Echo: EF 40-45%, diff HK. Gr1 DD. Mild to mod MR. Mildly to mod dil LA.   Dia hypertension 07/20/2014   Diabetes mellitus without complication (HCC)    Hx of heart artery stent 06/09/2015   3.0 x 12 mm Xience Alpine DES x 1 to LCx   Hypertension    Hypertensive heart disease    Left inguinal hernia 05/2020   Mixed Ischemic and Non-ischemic Cardiomyopathy    a.) 02/2015 EF 20-25%, diff HK; b.) 09/2016 EF 40-45%; c.) 09/2019 EF 25-30%   Moderate mitral regurgitation    a. 09/2017 Echo: Mild to mod TR.   Pulmonary hypertension (HCC)    Tubular adenoma of colon    Past Surgical History:  Procedure Laterality Date   APPENDECTOMY N/A 06/19/2017   Location: [facility]; Surgeon: WConsuela Mimes MD   CARDIAC CATHETERIZATION Bilateral 02/24/2015   Procedure: Right/Left Heart Cath and Coronary Angiography;  Surgeon: MWellington Hampshire MD;  [facility];  Service: Cardiovascular;  Laterality: Bilateral;   CARDIAC CATHETERIZATION N/A 06/09/2015   Procedure: Coronary Stent Intervention (3.0 x 12 mm Xience Alpine DES to LCx);  Surgeon: MWellington Hampshire MD;  [facility];  Service: Cardiovascular;  Laterality: N/A   COLECTOMY  06/19/2017   Descending and proximal sigmoid colectomy, LEFT ureterolysis, partial cecectomy, appendectomy; [facility] [facility] [place]geon: WConsuela Mimes MD   COLONOSCOPY     COLONOSCOPY WITH PROPOFOL N/A 12/11/2016   Procedure: COLONOSCOPY WITH PROPOFOL;  Surgeon: SLollie Sails MD;  [facility];  Service: Endoscopy;  Laterality: N/A;   ESOPHAGOGASTRODUODENOSCOPY     PORT-A-CATH REMOVAL N/A 07/12/2014   Procedure: REMOVAL PORT-A-CATH;   Surgeon: WMolly Maduro MD;  Location: [facility] ORS;  Service: General;  Laterality: N/A;   PORTACATH PLACEMENT Left 2015   Family History  Problem Relation Age of Onset   Cancer Mother    Social History   Tobacco Use   Smoking status: Never   Smokeless tobacco: Never  Vaping Use   Vaping Use: Never used  Substance Use Topics   Alcohol use: No   Drug use: No    Pertinent Clinical Results:  LABS: Labs reviewed: Acceptable for surgery.  Lab Results  Component Value Date   WBC 4.1 08/22/2020   HGB 10.7 (L) 08/22/2020   HCT 31.0 (L) 08/22/2020   MCV 94.5 08/22/2020   PLT 114 (L) 08/22/2020   Lab Results  Component Value Date   NA 140 09/01/2020   K 4.0 09/01/2020   CO2 28 09/01/2020   GLUCOSE 96 09/01/2020   BUN 13 09/01/2020   CREATININE 1.34 09/01/2020   CALCIUM 9.3 09/01/2020   GFRNONAA 54 (L) 08/22/2020   GFRAA 54 (L) 08/19/2019    ECG: Date: 08/26/2020 Time ECG obtained: 0939 AM Rate: 66 bpm Rhythm:  Sinus rhythm with marked sinus arrhythmia Axis (leads I and aVF): Normal Intervals: PR 200 ms. QRS  100 ms. QTc 507 ms. ST segment and T wave changes: Nonspecific T wave abnormality; prolonged QTC Comparison: Similar to previous tracing obtained on 08/11/2019   IMAGING / PROCEDURES: TRANSTHORACIC ECHOCARDIOGRAM performed on 09/09/2019 Left ventricular ejection fraction, by estimation, is 25 to 30%. The  left ventricle has severely decreased function. The left ventricle  demonstrates global hypokinesis. Left ventricular diastolic parameters are  indeterminate.   2. Right ventricular systolic function is moderately reduced. The right  ventricular size is normal. There is severely elevated pulmonary artery  systolic pressure.   3. Left atrial size was moderately dilated.   4. Right atrial size was mildly dilated.   5. The mitral valve is normal in structure. Mild to moderate mitral valve  regurgitation.   6. The aortic valve is normal in structure. Aortic  valve regurgitation is  not visualized.   7. The inferior vena cava is dilated in size with <50% respiratory  variability, suggesting right atrial pressure of 15 mmHg.   LONG TERM CARDIAC EVENT MONITOR STUDY performed on 12/24/2018 Predominantly underlying sinus rhythm with average heart rate of 66 bpm (range 43-115 bpm). Frequent PVCs and PACs (approximately 6% burden). 2 episodes of NSVT lasting up to 4 beats with a maximal heart rate of 158 bpm. 11 atrial runs lasting up to 12 beats with a maximum heart rate 135 bpm No sustained arrhythmia or prolonged pauses. No patient triggered events.  LEFT HEART CATHETERIZATION AND CORONARY ANGIOGRAPHY performed on 06/09/2015 CAD 40% stenosis of the proximal RCA 30% stenosis of the distal RCA 40% stenosis of the first RPLB 50% stenosis of the mid to distal LAD 40% stenosis of the distal LAD 80% stenosis of the proximal LCx Successful PCI 3.0 x 12 mm Xience Alpine DES x 1 placed to the LCx   RIGHT/LEFT HEART CATHETERIZATION AND CORONARY ANGIOGRAPHY performed on 02/24/2015 Moderate to severe pulmonary hypertension with severely elevated filling pressure.  PA pressure was 70/24 with a mean of 40.  Pulmonary wedge pressure was 30 mmHg.  Severely reduced cardiac output at 3.27 with a cardiac index of 1.65.  No evidence of aortic or mitral stenosis. Significant one-vessel CAD 40% stenosis of the proximal RCA 30% stenosis of the distal RCA 40% stenosis of the first RPLB 50% stenosis of the mid to distal LAD 40% stenosis of the distal LAD 80% stenosis of the proximal LCx Severely reduced LV systolic function by echocardiogram. Recommendations Cardiomyopathy is out of proportion to CAD. Significantly fluid overloaded; increase furosemide to 40 mg BID. Check BMP in 1 week and consider adding digoxin in order to up titrate other heart failure medications. LCx PCI can be considered in the future once heart failure volume status is  optimized.   Impression and Plan:  [name]. has been referred for pre-anesthesia review and clearance prior to him undergoing the planned anesthetic and procedural courses. Available labs, pertinent testing, and imaging results were personally reviewed by me. This patient has been appropriately cleared by cardiology (MODERATE) and family/internal medicine (MODERATE) with the individually indicated risks of significant perioperative complications.  Based on clinical review performed today (09/02/20), barring any significant acute changes in the patient's overall condition, it is anticipated that he will be able to proceed with the planned surgical intervention. Any acute changes in clinical condition may necessitate his procedure being postponed and/or cancelled. Patient will meet with anesthesia team (MD and/or CRNA) on the day of his procedure for preoperative evaluation/assessment. Questions regarding anesthetic course will be fielded at  that time.   Pre-surgical instructions were reviewed with the patient during his PAT appointment and questions were fielded by PAT clinical staff. Patient was advised that if any questions or concerns arise prior to his procedure then he should return a call to PAT and/or his surgeon's office to discuss.  Honor Loh, MSN, APRN, FNP-C, CEN [facility]  Peri-operative Services Nurse Practitioner Phone: [phone] Fax: [phone] 09/02/20 1:11 PM  NOTE: This note has been prepared using Dragon dictation software. Despite my best ability to proofread, there is always the potential that unintentional transcriptional errors may still occur from this process.

## 2020-09-02 NOTE — Telephone Encounter (Signed)
Repeat UA today with resolution of microscopic hematuria.  This is excellent news.  No further intervention indicated.  Please schedule him for annual follow-up with [facility] regarding his BPH, prostate nodule, renal cysts, and history of hematuria.

## 2020-09-05 NOTE — Progress Notes (Unsigned)
Medical Clearance received from Dr Ellen Henri office. The patient is cleared for surgery at Medium risk.

## 2020-09-06 MED ORDER — ORAL CARE MOUTH RINSE: 15.0000 mL | Freq: Once | OROMUCOSAL | Status: AC

## 2020-09-06 MED ORDER — CHLORHEXIDINE GLUCONATE CLOTH 2 % EX PADS: 6.0000 | MEDICATED_PAD | Freq: Once | CUTANEOUS | Status: DC

## 2020-09-06 MED ORDER — FAMOTIDINE 20 MG PO TABS
20.0000 mg | ORAL_TABLET | Freq: Once | ORAL | Status: AC
Start: 2020-09-06 — End: 2020-09-07
  Administered 2020-09-07: 20 mg via ORAL

## 2020-09-06 MED ORDER — CEFAZOLIN SODIUM-DEXTROSE 2-4 GM/100ML-% IV SOLN
2.0000 g | INTRAVENOUS | Status: AC
  Administered 2020-09-07: 2 g via INTRAVENOUS

## 2020-09-06 MED ORDER — SODIUM CHLORIDE 0.9 % IV SOLN: INTRAVENOUS | Status: DC

## 2020-09-06 MED ORDER — CHLORHEXIDINE GLUCONATE 0.12 % MT SOLN
15.0000 mL | Freq: Once | OROMUCOSAL | Status: AC
Start: 2020-09-06 — End: 2020-09-07
  Administered 2020-09-07: 15 mL via OROMUCOSAL

## 2020-09-07 ENCOUNTER — Encounter: Payer: Self-pay | Admitting: Surgery

## 2020-09-07 ENCOUNTER — Encounter
Admission: RE | Disposition: A | Discharge status: Home / Self Care | Payer: Self-pay | Source: Home / Self Care | Attending: Surgery

## 2020-09-07 ENCOUNTER — Ambulatory Visit: Payer: PPO | Admitting: Urgent Care

## 2020-09-07 ENCOUNTER — Ambulatory Visit
Admission: RE | Admit: 2020-09-07 | Discharge: 2020-09-07 | Disposition: A | Discharge status: Home / Self Care | Payer: PPO | Attending: Surgery | Admitting: Surgery

## 2020-09-07 DIAGNOSIS — I255 Ischemic cardiomyopathy: Secondary | ICD-10-CM | POA: Insufficient documentation

## 2020-09-07 DIAGNOSIS — N183 Chronic kidney disease, stage 3 unspecified: Secondary | ICD-10-CM | POA: Insufficient documentation

## 2020-09-07 DIAGNOSIS — K4020 Bilateral inguinal hernia, without obstruction or gangrene, not specified as recurrent: Secondary | ICD-10-CM | POA: Diagnosis not present

## 2020-09-07 DIAGNOSIS — Z7984 Long term (current) use of oral hypoglycemic drugs: Secondary | ICD-10-CM | POA: Diagnosis not present

## 2020-09-07 DIAGNOSIS — I5022 Chronic systolic (congestive) heart failure: Secondary | ICD-10-CM | POA: Diagnosis not present

## 2020-09-07 DIAGNOSIS — D649 Anemia, unspecified: Secondary | ICD-10-CM | POA: Diagnosis not present

## 2020-09-07 DIAGNOSIS — I25119 Atherosclerotic heart disease of native coronary artery with unspecified angina pectoris: Secondary | ICD-10-CM | POA: Diagnosis not present

## 2020-09-07 DIAGNOSIS — Z9049 Acquired absence of other specified parts of digestive tract: Secondary | ICD-10-CM | POA: Diagnosis not present

## 2020-09-07 DIAGNOSIS — Z955 Presence of coronary angioplasty implant and graft: Secondary | ICD-10-CM | POA: Diagnosis not present

## 2020-09-07 DIAGNOSIS — I272 Pulmonary hypertension, unspecified: Secondary | ICD-10-CM | POA: Insufficient documentation

## 2020-09-07 DIAGNOSIS — Z85038 Personal history of other malignant neoplasm of large intestine: Secondary | ICD-10-CM | POA: Diagnosis not present

## 2020-09-07 DIAGNOSIS — Z91013 Allergy to seafood: Secondary | ICD-10-CM | POA: Insufficient documentation

## 2020-09-07 DIAGNOSIS — K4090 Unilateral inguinal hernia, without obstruction or gangrene, not specified as recurrent: Secondary | ICD-10-CM | POA: Diagnosis present

## 2020-09-07 DIAGNOSIS — Z9221 Personal history of antineoplastic chemotherapy: Secondary | ICD-10-CM | POA: Insufficient documentation

## 2020-09-07 DIAGNOSIS — Z7982 Long term (current) use of aspirin: Secondary | ICD-10-CM | POA: Insufficient documentation

## 2020-09-07 DIAGNOSIS — E1122 Type 2 diabetes mellitus with diabetic chronic kidney disease: Secondary | ICD-10-CM | POA: Insufficient documentation

## 2020-09-07 DIAGNOSIS — Z79899 Other long term (current) drug therapy: Secondary | ICD-10-CM | POA: Diagnosis not present

## 2020-09-07 DIAGNOSIS — K4021 Bilateral inguinal hernia, without obstruction or gangrene, recurrent: Secondary | ICD-10-CM | POA: Diagnosis not present

## 2020-09-07 DIAGNOSIS — I130 Hypertensive heart and chronic kidney disease with heart failure and stage 1 through stage 4 chronic kidney disease, or unspecified chronic kidney disease: Secondary | ICD-10-CM | POA: Insufficient documentation

## 2020-09-07 DIAGNOSIS — I428 Other cardiomyopathies: Secondary | ICD-10-CM | POA: Diagnosis not present

## 2020-09-07 HISTORY — DX: Pulmonary hypertension, unspecified: I27.20

## 2020-09-07 LAB — GLUCOSE, CAPILLARY
Glucose-Capillary: 97 mg/dL (ref 70–99)
Glucose-Capillary: 108 mg/dL — ABNORMAL HIGH (ref 70–99)

## 2020-09-07 SURGERY — REPAIR, HERNIA, INGUINAL, BILATERAL, ROBOT-ASSISTED
Anesthesia: General | Laterality: Left

## 2020-09-07 MED ORDER — ROCURONIUM BROMIDE 100 MG/10ML IV SOLN
INTRAVENOUS | Status: DC | PRN
  Administered 2020-09-07: 50 mg via INTRAVENOUS

## 2020-09-07 MED ORDER — PHENYLEPHRINE HCL (PRESSORS) 10 MG/ML IV SOLN
INTRAVENOUS | Status: DC | PRN
  Administered 2020-09-07 (×2): 100 ug via INTRAVENOUS

## 2020-09-07 MED ORDER — HYDROCODONE-ACETAMINOPHEN 5-325 MG PO TABS: 1.0000 | ORAL_TABLET | Freq: Four times a day (QID) | ORAL | 0 refills | Status: DC | PRN

## 2020-09-07 MED ORDER — ONDANSETRON HCL 4 MG/2ML IJ SOLN
INTRAMUSCULAR | Status: DC | PRN
  Administered 2020-09-07: 4 mg via INTRAVENOUS

## 2020-09-07 MED ORDER — BUPIVACAINE-EPINEPHRINE (PF) 0.25% -1:200000 IJ SOLN
INTRAMUSCULAR | Status: AC
  Filled 2020-09-07: qty 30

## 2020-09-07 MED ORDER — FENTANYL CITRATE (PF) 100 MCG/2ML IJ SOLN
INTRAMUSCULAR | Status: AC
  Filled 2020-09-07: qty 2

## 2020-09-07 MED ORDER — LIDOCAINE HCL (CARDIAC) PF 100 MG/5ML IV SOSY
PREFILLED_SYRINGE | INTRAVENOUS | Status: DC | PRN
  Administered 2020-09-07: 100 mg via INTRAVENOUS

## 2020-09-07 MED ORDER — CHLORHEXIDINE GLUCONATE 0.12 % MT SOLN
OROMUCOSAL | Status: AC
  Filled 2020-09-07: qty 15

## 2020-09-07 MED ORDER — 0.9 % SODIUM CHLORIDE (POUR BTL) OPTIME
TOPICAL | Status: DC | PRN
  Administered 2020-09-07: 3 mL

## 2020-09-07 MED ORDER — ONDANSETRON HCL 4 MG/2ML IJ SOLN
4.0000 mg | Freq: Once | INTRAMUSCULAR | Status: DC | PRN
Start: 2020-09-07 — End: 2020-09-07

## 2020-09-07 MED ORDER — EPHEDRINE SULFATE 50 MG/ML IJ SOLN
INTRAMUSCULAR | Status: DC | PRN
  Administered 2020-09-07 (×2): 5 mg via INTRAVENOUS

## 2020-09-07 MED ORDER — FENTANYL CITRATE (PF) 100 MCG/2ML IJ SOLN: 25.0000 ug | INTRAMUSCULAR | Status: DC | PRN

## 2020-09-07 MED ORDER — FENTANYL CITRATE (PF) 100 MCG/2ML IJ SOLN
INTRAMUSCULAR | Status: DC | PRN
  Administered 2020-09-07 (×2): 50 ug via INTRAVENOUS
  Administered 2020-09-07: 100 ug via INTRAVENOUS

## 2020-09-07 MED ORDER — PROPOFOL 10 MG/ML IV BOLUS
INTRAVENOUS | Status: DC | PRN
  Administered 2020-09-07: 100 mg via INTRAVENOUS

## 2020-09-07 MED ORDER — BUPIVACAINE-EPINEPHRINE (PF) 0.25% -1:200000 IJ SOLN
INTRAMUSCULAR | Status: DC | PRN
  Administered 2020-09-07: 20 mL
  Administered 2020-09-07: 10 mL

## 2020-09-07 MED ORDER — IBUPROFEN 800 MG PO TABS: 800.0000 mg | ORAL_TABLET | Freq: Three times a day (TID) | ORAL | 0 refills | Status: DC | PRN

## 2020-09-07 MED ORDER — FAMOTIDINE 20 MG PO TABS
ORAL_TABLET | ORAL | Status: AC
  Filled 2020-09-07: qty 1

## 2020-09-07 MED ORDER — PROPOFOL 10 MG/ML IV BOLUS
INTRAVENOUS | Status: AC
  Filled 2020-09-07: qty 40

## 2020-09-07 MED ORDER — CEFAZOLIN SODIUM-DEXTROSE 2-4 GM/100ML-% IV SOLN
INTRAVENOUS | Status: AC
  Filled 2020-09-07: qty 100

## 2020-09-07 MED ORDER — SUGAMMADEX SODIUM 500 MG/5ML IV SOLN
INTRAVENOUS | Status: DC | PRN
  Administered 2020-09-07: 200 mg via INTRAVENOUS

## 2020-09-07 SURGICAL SUPPLY — 50 items
ADH SKN CLS APL DERMABOND .7 (GAUZE/BANDAGES/DRESSINGS) ×2
APL PRP STRL LF DISP 70% ISPRP (MISCELLANEOUS) ×2
BLADE CLIPPER SURG (BLADE) ×3 IMPLANT
CANNULA CAP OBTURATR AIRSEAL 8 (CAP) ×3 IMPLANT
CHLORAPREP W/TINT 26 (MISCELLANEOUS) ×3 IMPLANT
COVER TIP SHEARS 8 DVNC (MISCELLANEOUS) ×2 IMPLANT
COVER TIP SHEARS 8MM DA VINCI (MISCELLANEOUS) ×1
COVER WAND RF STERILE (DRAPES) ×3 IMPLANT
DEFOGGER SCOPE WARMER CLEARIFY (MISCELLANEOUS) ×3 IMPLANT
DERMABOND ADVANCED (GAUZE/BANDAGES/DRESSINGS) ×1
DERMABOND ADVANCED .7 DNX12 (GAUZE/BANDAGES/DRESSINGS) ×2 IMPLANT
DRAPE 3/4 80X56 (DRAPES) ×3 IMPLANT
DRAPE ARM DVNC X/XI (DISPOSABLE) ×6 IMPLANT
DRAPE COLUMN DVNC XI (DISPOSABLE) ×2 IMPLANT
DRAPE DA VINCI XI ARM (DISPOSABLE) ×3
DRAPE DA VINCI XI COLUMN (DISPOSABLE) ×1
ELECT REM PT RETURN 9FT ADLT (ELECTROSURGICAL) ×3 IMPLANT
ELECTRODE REM PT RTRN 9FT ADLT (ELECTROSURGICAL) ×2 IMPLANT
GAUZE 4X4 16PLY ~~LOC~~+RFID DBL (SPONGE) ×3 IMPLANT
GLOVE SURG ORTHO LTX SZ7.5 (GLOVE) ×9 IMPLANT
GOWN STRL REUS W/ TWL LRG LVL3 (GOWN DISPOSABLE) ×6 IMPLANT
GOWN STRL REUS W/TWL LRG LVL3 (GOWN DISPOSABLE) ×9
GRASPER SUT TROCAR 14GX15 (MISCELLANEOUS) IMPLANT
IRRIGATION STRYKERFLOW (MISCELLANEOUS) IMPLANT
IRRIGATOR STRYKERFLOW (MISCELLANEOUS) IMPLANT
IV CATH ANGIO 14GX1.88 NO SAFE (IV SOLUTION) ×6 IMPLANT
IV NS 1000ML (IV SOLUTION)
IV NS 1000ML BAXH (IV SOLUTION) IMPLANT
KIT PINK PAD W/HEAD ARE REST (MISCELLANEOUS) ×3 IMPLANT
KIT PINK PAD W/HEAD ARM REST (MISCELLANEOUS) ×2 IMPLANT
LABEL OR SOLS (LABEL) ×3 IMPLANT
MANIFOLD NEPTUNE II (INSTRUMENTS) ×3 IMPLANT
MESH 3DMAX LIGHT 4.1X6.2 LT LR (Mesh General) ×3 IMPLANT
MESH 3DMAX LIGHT 4.1X6.2 RT LR (Mesh General) ×3 IMPLANT
NEEDLE HYPO 22GX1.5 SAFETY (NEEDLE) ×3 IMPLANT
NEEDLE INSUFFLATION 14GA 120MM (NEEDLE) IMPLANT
PACK LAP CHOLECYSTECTOMY (MISCELLANEOUS) ×3 IMPLANT
SEAL CANN UNIV 5-8 DVNC XI (MISCELLANEOUS) ×4 IMPLANT
SEAL XI 5MM-8MM UNIVERSAL (MISCELLANEOUS) ×2
SET TUBE FILTERED XL AIRSEAL (SET/KITS/TRAYS/PACK) ×3 IMPLANT
SOLUTION ELECTROLUBE (MISCELLANEOUS) ×3 IMPLANT
SUT MNCRL 4-0 (SUTURE) ×3
SUT MNCRL 4-0 27 PS-2 XMFL (SUTURE) ×2 IMPLANT
SUT MNCRL 4-0 27XMFL (SUTURE) ×2
SUT VIC AB 0 CT2 27 (SUTURE) ×3 IMPLANT
SUT VLOC 90 2/L VL 12 GS22 (SUTURE) IMPLANT
SUT VLOC 90 S/L VL9 GS22 (SUTURE) ×3 IMPLANT
SUTURE MNCRL 4-0 27XMF (SUTURE) ×2 IMPLANT
TAPE TRANSPORE STRL 2 31045 (GAUZE/BANDAGES/DRESSINGS) ×3 IMPLANT
TROCAR Z-THREAD FIOS 11X100 BL (TROCAR) IMPLANT

## 2020-09-07 NOTE — Op Note (Signed)
Robotic assisted Laparoscopic Transabdominal Bilateral Inguinal Hernia Repair with Mesh       Pre-operative Diagnosis:  Left  Inguinal Hernia   Post-operative Diagnosis: Bilateral Inguinal Hernias   Procedure: Robotic assisted Laparoscopic  repair of bilateral inguinal hernia(s)   Surgeon: Ronny Bacon, M.D., FACS   Anesthesia: GETA   Findings: Large left, and smaller right indirect inguinal hernia.         Procedure Details  The patient was seen again in the Holding Room. The benefits, complications, treatment options, and expected outcomes were discussed with the patient. The risks of bleeding, infection, recurrence of symptoms, failure to resolve symptoms, recurrence of hernia, ischemic orchitis, chronic pain syndrome or neuroma, were reviewed again. The likelihood of improving the patient's symptoms with return to their baseline status is good.  The patient and/or family concurred with the proposed plan, giving informed consent.  The patient was taken to Operating Room, identified  and the procedure verified as Laparoscopic Inguinal Hernia Repair. Laterality confirmed.  A Time Out was held and the above information confirmed.   Prior to the induction of general anesthesia, antibiotic prophylaxis was administered. VTE prophylaxis was in place. General endotracheal anesthesia was then administered and tolerated well. After the induction, the abdomen was prepped with Chloraprep and draped in the sterile fashion. The patient was positioned in the supine position.   After local infiltration of quarter percent Marcaine with epinephrine, stab incision was made left upper quadrant.  On the left at Palmer's point, the Veress needle is passed with sensation of the layers to penetrate the abdominal wall and into the peritoneum.  Saline drop test is confirmed peritoneal placement.  Insufflation is initiated with carbon dioxide to pressures of 15 mmHg. An 8.5 mm port is placed to the left off of the  midline, with blunt tipped trocar.  Pneumoperitoneum maintained w/o HD changes using the AirSeal to pressures of 15 mm Hg with CO2. No evidence of bowel injuries.  Two 8.5 mm ports placed under direct vision in each upper quadrant. The laparoscopy revealed bilateral indirect defect(s).   Along with midline scarring.  The robot was brought ot the table and docked in the standard fashion, no collision between arms was observed. Instruments were kept under direct view at all times. I lysed the adhesions to the lower anterior midline scar.  For bilater inguinal hernia repair,  I developed a peritoneal flap, allowing them to remain separate due to the midline scar. The sac(s) were reduced and dissected free from adjacent structures. We preserved the vas and the vessels, and visualized them to their convergence and beyond in the retroperitoneum. Once dissection was completed a large left sided BARD 3D Light mesh was placed and secured at three points with interrupted 0 Vicryl to the pubic tubercle and anteriorly. There was good coverage of the direct, indirect and femoral spaces.  Second look revealed no complications or injuries. Attention then was turned to the opposite side. The sac was also reduced and dissected free from adjacent structures. We preserved the vas and the vessels, in like manner with adequate posterior dissection. Once dissection was completed the same, but right sided contralateral mesh was placed and secured in like manner with interrupted 0 Vicryl. There was good coverage of the direct, indirect and femoral spaces.  The flap was then closed with 2-0 V-lock suture.  Peritoneal closure without defects, using the 0-Vicryl to close the transected and redundant left sac, and the V-lock to close a small hole on the  right.  Once assuring that hemostasis was adequate, all needles/sponges removed, and the robot was undocked.  A large angiocath is placed under direct visualization in the left  groin to reduce trapped extraperitoneal air and confirm adequate peritoneal closure.  The ports were removed, the abdomen desulflated.  4-0 subcuticular Monocryl was used at all skin edges. Dermabond was placed.  Patient tolerated the procedure well. There were no complications. He was taken to the recovery room in stable condition.           Ronny Bacon, M.D., FACS 09/07/2020, 3:45 PM

## 2020-09-07 NOTE — Discharge Instructions (Addendum)
AMBULATORY SURGERY  DISCHARGE INSTRUCTIONS   The drugs that you were given will stay in your system until tomorrow so for the next 24 hours you should not:  Drive an automobile Make any legal decisions Drink any alcoholic beverage   You may resume regular meals tomorrow.  Today it is better to start with liquids and gradually work up to solid foods.  You may eat anything you prefer, but it is better to start with liquids, then soup and crackers, and gradually work up to solid foods.   Please notify your doctor immediately if you have any unusual bleeding, trouble breathing, redness and pain at the surgery site, drainage, fever, or pain not relieved by medication.       Please contact your physician with any problems or Same Day Surgery at [phone], Monday through Friday 6 am to 4 pm, or [place] [facility] Main number at [phone].  

## 2020-09-07 NOTE — Anesthesia Preprocedure Evaluation (Signed)
Anesthesia Evaluation  Patient identified by MRN, date of birth, ID band Patient awake    Reviewed: Allergy & Precautions, H&P , NPO status , Patient's Chart, lab work & pertinent test results, reviewed documented beta blocker date and time   History of Anesthesia Complications Negative for: history of anesthetic complications  Airway Mallampati: III  TM Distance: >3 FB Neck ROM: full    Dental  (+) Poor Dentition, Missing, Chipped, Dental Advidsory Given   Pulmonary neg pulmonary ROS,           Cardiovascular Exercise Tolerance: Good hypertension, pulmonary hypertension(-) angina+ CAD, + Cardiac Stents and +CHF  (-) Past MI and (-) CABG (-) dysrhythmias + Valvular Problems/Murmurs      Neuro/Psych negative neurological ROS  negative psych ROS   GI/Hepatic negative GI ROS, Neg liver ROS,   Endo/Other  diabetes  Renal/GU CRFRenal disease  negative genitourinary   Musculoskeletal   Abdominal   Peds  Hematology negative hematology ROS (+)   Anesthesia Other Findings Past Medical History: No date: Anemia No date: CAD (coronary artery disease)     Comment:  a. 02/2015 Cath: LM nl, LAD 50/40 mid/distal, LCX 80p,               RCA 40p, 30d, RPL1 40, CO 3.27, CI 1.65; b. cath 06/09/15:               LM minor irregs, m-dLAD 50%, dLAD 40%, pLCx 80% s/p               PCI/DES 0%, pRCA 40%, dRCA 30%, 1st RPLB 40% No date: Chronic systolic CHF (congestive heart failure) (HCC)     Comment:  a. 02/2015 Echo: EF 20-25%, diff HK, mod MR, mildy dil               LA, mildly dil RV w/ mod RV syst dysfxn, mildly dil RA,               mod TR, PASP 97mHg. No date: Colon cancer ([facility]     Comment:  a. 2015 s/p colectomy followed by chemoRx with               oxaliplatin & fluorouacil. No date: Hypertension No date: Hypertensive heart disease No date: Mixed Ischemic and Non-ischemic Cardiomyopathy     Comment:  a. 02/2015 Echo: EF 20-25%,  diff HK. No date: Moderate mitral regurgitation No date: Moderate tricuspid regurgitation No date: Tubular adenoma of colon   Reproductive/Obstetrics negative OB ROS                             Anesthesia Physical  Anesthesia Plan  ASA: 3  Anesthesia Plan: General   Post-op Pain Management:    Induction: Intravenous  PONV Risk Score and Plan: 2 and Ondansetron, Dexamethasone and Treatment may vary due to age or medical condition  Airway Management Planned: Oral ETT  Additional Equipment:   Intra-op Plan:   Post-operative Plan: Extubation in OR  Informed Consent: I have reviewed the patients History and Physical, chart, labs and discussed the procedure including the risks, benefits and alternatives for the proposed anesthesia with the patient or authorized representative who has indicated his/her understanding and acceptance.     Dental Advisory Given  Plan Discussed with: Anesthesiologist, CRNA and Surgeon  Anesthesia Plan Comments:         Anesthesia Quick Evaluation

## 2020-09-07 NOTE — Transfer of Care (Signed)
Immediate Anesthesia Transfer of Care Note  Patient: [name].  Procedure(s) Performed: XI ROBOTIC ASSISTED BILATERAL INGUINAL HERNIA WITH MESH (Bilateral)  Patient Location: PACU  Anesthesia Type:General  Level of Consciousness: awake and alert   Airway & Oxygen Therapy: Patient Spontanous Breathing  Post-op Assessment: Report given to RN and Post -op Vital signs reviewed and stable  Post vital signs: Reviewed and stable  Last Vitals:  Vitals Value Taken Time  BP 152/113 09/07/20 1539  Temp    Pulse 63 09/07/20 1542  Resp 19 09/07/20 1542  SpO2 100 % 09/07/20 1542  Vitals shown include unvalidated device data.  Last Pain:  Vitals:   09/07/20 1024  PainSc: 0-No pain         Complications: No notable events documented.

## 2020-09-07 NOTE — Interval H&P Note (Signed)
History and Physical Interval Note:  09/07/2020 12:38 PM  [name].  has presented today for surgery, with the diagnosis of left inguinal hernia.  The various methods of treatment have been discussed with the patient and family. After consideration of risks, benefits and other options for treatment, the patient has consented to  Procedure(s): XI ROBOTIC [place] (Left) as a surgical intervention.  The patient's history has been reviewed, patient examined, no change in status, stable for surgery.  I have reviewed the patient's chart and labs.  Questions were answered to the patient's satisfaction.   Left side is marked   [employer]

## 2020-09-07 NOTE — Anesthesia Postprocedure Evaluation (Signed)
Anesthesia Post Note  Patient: Deyvon Stallard.  Procedure(s) Performed: XI ROBOTIC ASSISTED BILATERAL INGUINAL HERNIA WITH MESH (Bilateral)  Patient location during evaluation: PACU Anesthesia Type: General Level of consciousness: awake and alert Pain management: pain level controlled Vital Signs Assessment: post-procedure vital signs reviewed and stable Respiratory status: spontaneous breathing, nonlabored ventilation, respiratory function stable and patient connected to nasal cannula oxygen Cardiovascular status: blood pressure returned to baseline and stable Postop Assessment: no apparent nausea or vomiting Anesthetic complications: no   No notable events documented.   Last Vitals:  Vitals:   09/07/20 1610 09/07/20 1619  BP: (!) 145/63 (!) 136/53  Pulse: (!) 49 (!) 58  Resp: 16 16  Temp: 36.7 C (!) 36.3 C  SpO2: 100% 98%    Last Pain:  Vitals:   09/07/20 1619  TempSrc: Tympanic  PainSc: 0-No pain                 Martha Clan

## 2020-09-07 NOTE — Anesthesia Procedure Notes (Signed)
Procedure Name: Intubation Date/Time: 09/07/2020 1:20 PM Performed by: Louann Sjogren, CRNA Pre-anesthesia Checklist: Patient identified, Patient being monitored, Timeout performed, Emergency Drugs available and Suction available Patient Re-evaluated:Patient Re-evaluated prior to induction Oxygen Delivery Method: Circle system utilized Preoxygenation: Pre-oxygenation with 100% oxygen Induction Type: IV induction Ventilation: Mask ventilation without difficulty Laryngoscope Size: Mac and 4 Grade View: Grade I Tube type: Oral Tube size: 7.0 mm Number of attempts: 1 Airway Equipment and Method: Stylet Placement Confirmation: ETT inserted through vocal cords under direct vision, positive ETCO2 and breath sounds checked- equal and bilateral Secured at: 21 cm Tube secured with: Tape Dental Injury: Teeth and Oropharynx as per pre-operative assessment

## 2020-09-08 ENCOUNTER — Other Ambulatory Visit: Payer: Self-pay | Admitting: Physician Assistant

## 2020-09-19 ENCOUNTER — Encounter: Payer: Self-pay | Admitting: *Deleted

## 2020-09-20 ENCOUNTER — Ambulatory Visit
Admission: RE | Admit: 2020-09-20 | Discharge: 2020-09-20 | Disposition: A | Discharge status: Home / Self Care | Payer: PPO | Source: Ambulatory Visit | Attending: Oncology | Admitting: Oncology

## 2020-09-20 ENCOUNTER — Other Ambulatory Visit: Payer: Self-pay

## 2020-09-20 DIAGNOSIS — K3189 Other diseases of stomach and duodenum: Secondary | ICD-10-CM | POA: Diagnosis not present

## 2020-09-20 DIAGNOSIS — J929 Pleural plaque without asbestos: Secondary | ICD-10-CM | POA: Diagnosis not present

## 2020-09-20 DIAGNOSIS — D7389 Other diseases of spleen: Secondary | ICD-10-CM | POA: Diagnosis not present

## 2020-09-20 DIAGNOSIS — I2510 Atherosclerotic heart disease of native coronary artery without angina pectoris: Secondary | ICD-10-CM | POA: Diagnosis not present

## 2020-09-20 DIAGNOSIS — J9811 Atelectasis: Secondary | ICD-10-CM | POA: Diagnosis not present

## 2020-09-20 DIAGNOSIS — N433 Hydrocele, unspecified: Secondary | ICD-10-CM | POA: Diagnosis not present

## 2020-09-20 DIAGNOSIS — C187 Malignant neoplasm of sigmoid colon: Secondary | ICD-10-CM | POA: Insufficient documentation

## 2020-09-20 DIAGNOSIS — N281 Cyst of kidney, acquired: Secondary | ICD-10-CM | POA: Diagnosis not present

## 2020-09-20 MED ORDER — IOHEXOL 350 MG/ML SOLN
100.0000 mL | Freq: Once | INTRAVENOUS | Status: AC | PRN
  Administered 2020-09-20: 100 mL via INTRAVENOUS

## 2020-09-21 ENCOUNTER — Telehealth: Payer: Self-pay | Admitting: Surgery

## 2020-09-21 ENCOUNTER — Telehealth: Payer: Self-pay | Admitting: Oncology

## 2020-09-21 NOTE — Telephone Encounter (Signed)
Outbound call made to the patient in an attempt to schedule a post op appt (due this week).  The voicemail box has not been set up; therefore, no message was able to be left for the pt to call back & schedule a f/up or post op visit w/Dr. Christian Mate.

## 2020-09-21 NOTE — Telephone Encounter (Signed)
Re- CT scan results  Called to discuss CT scan results with patient.  Left voicemail.   CT scan showed no definitive signs of new or progressive disease.  It did show large collection containing gas in the left inguinal canal just above hydrocele.  Could represent hematoma or seroma following recent bilateral inguinal surgery.  Case was discussed with surgery (Dr. Celine Ahr) as Dr. Christian Mate was unavailable.  She reviewed his CT scan and it appears that he had some intra-abdominal adhesions that had to be removed prior to actual hernia repair.  If the hernia was hard to reduce, she believes this could explain the fluid in the scrotum.  She did not believe that he needed intervention at this time and he could follow-up with Dr. Christian Mate when scheduled.  We will touch base with patient and see how he is feeling and make sure he is not having any signs of infection.  Faythe Casa, NP 09/21/2020 11:27 AM

## 2020-09-22 NOTE — Telephone Encounter (Signed)
LVM for pt to call the office to schedule an appt. °

## 2020-09-27 ENCOUNTER — Inpatient Hospital Stay: Payer: PPO | Attending: Oncology

## 2020-09-27 ENCOUNTER — Encounter: Payer: Self-pay | Admitting: Surgery

## 2020-09-27 ENCOUNTER — Other Ambulatory Visit: Payer: Self-pay

## 2020-09-27 ENCOUNTER — Ambulatory Visit (INDEPENDENT_AMBULATORY_CARE_PROVIDER_SITE_OTHER): Payer: PPO | Admitting: Surgery

## 2020-09-27 VITALS — BP 164/55 | HR 71 | Temp 98.5°F | Ht 69.0 in | Wt 153.6 lb

## 2020-09-27 DIAGNOSIS — E538 Deficiency of other specified B group vitamins: Secondary | ICD-10-CM | POA: Diagnosis not present

## 2020-09-27 DIAGNOSIS — Z9889 Other specified postprocedural states: Secondary | ICD-10-CM

## 2020-09-27 DIAGNOSIS — Z8719 Personal history of other diseases of the digestive system: Secondary | ICD-10-CM

## 2020-09-27 MED ORDER — CYANOCOBALAMIN 1000 MCG/ML IJ SOLN
1000.0000 ug | INTRAMUSCULAR | Status: DC
  Administered 2020-09-27: 1000 ug via INTRAMUSCULAR
  Filled 2020-09-27: qty 1

## 2020-09-27 NOTE — Patient Instructions (Addendum)
Let us know if it becomes painful.   If you have any concerns or questions, please feel free to call our office. See follow up appointment below.   GENERAL POST-OPERATIVE PATIENT INSTRUCTIONS   WOUND CARE INSTRUCTIONS:  Keep a dry clean dressing on the wound if there is drainage. The initial bandage may be removed after 24 hours.  Once the wound has quit draining you may leave it open to air.  If clothing rubs against the wound or causes irritation and the wound is not draining you may cover it with a dry dressing during the daytime.  Try to keep the wound dry and avoid ointments on the wound unless directed to do so.  If the wound becomes bright red and painful or starts to drain infected material that is not clear, please contact your physician immediately.  If the wound is mildly pink and has a thick firm ridge underneath it, this is normal, and is referred to as a healing ridge.  This will resolve over the next 4-6 weeks.  BATHING: You may shower if you have been informed of this by your surgeon. However, Please do not submerge in a tub, hot tub, or pool until incisions are completely sealed or have been told by your surgeon that you may do so.  DIET:  You may eat any foods that you can tolerate.  It is a good idea to eat a high fiber diet and take in plenty of fluids to prevent constipation.  If you do become constipated you may want to take a mild laxative or take ducolax tablets on a daily basis until your bowel habits are regular.  Constipation can be very uncomfortable, along with straining, after recent surgery.  ACTIVITY:  You are encouraged to cough and deep breath or use your incentive spirometer if you were given one, every 15-30 minutes when awake.  This will help prevent respiratory complications and low grade fevers post-operatively if you had a general anesthetic.  You may want to hug a pillow when coughing and sneezing to add additional support to the surgical area, if you had  abdominal or chest surgery, which will decrease pain during these times.  You are encouraged to walk and engage in light activity for the next two weeks.  You should not lift more than 15 pounds, until 10/19/2020 as it could put you at increased risk for complications.  Twenty pounds is roughly equivalent to a plastic bag of groceries. At that time- Listen to your body when lifting, if you have pain when lifting, stop and then try again in a few days. Soreness after doing exercises or activities of daily living is normal as you get back in to your normal routine.  MEDICATIONS:  Try to take narcotic medications and anti-inflammatory medications, such as tylenol, ibuprofen, naprosyn, etc., with food.  This will minimize stomach upset from the medication.  Should you develop nausea and vomiting from the pain medication, or develop a rash, please discontinue the medication and contact your physician.  You should not drive, make important decisions, or operate machinery when taking narcotic pain medication.  SUNBLOCK Use sun block to incision area over the next year if this area will be exposed to sun. This helps decrease scarring and will allow you avoid a permanent darkened area over your incision.  Laparoscopic Inguinal Hernia Repair, Adult, Care After  The following information offers guidance on how to care for yourself after your procedure. Your health care provider may also  give you more specific instructions. If you have problems or questions, contact your health careprovider. What can I expect after the procedure? After the procedure, it is common to have: Pain. Swelling and bruising around the incision area. Scrotal swelling, in males. Some fluid or blood draining from your incisions. Follow these instructions at home: Medicines Take over-the-counter and prescription medicines only as told by your health care provider. Ask your health care provider if the medicine prescribed to you: Requires  you to avoid driving or using machinery. Can cause constipation. You may need to take these actions to prevent or treat constipation: Drink enough fluid to keep your urine pale yellow. Take over-the-counter or prescription medicines. Eat foods that are high in fiber, such as beans, whole grains, and fresh fruits and vegetables. Limit foods that are high in fat and processed sugars, such as fried or sweet foods. Incision care  Follow instructions from your health care provider about how to take care of your incisions. Make sure you: Wash your hands with soap and water for at least 20 seconds before and after you change your bandage (dressing). If soap and water are not available, use hand sanitizer. Change your dressing as told by your health care provider. Leave stitches (sutures), skin glue, or adhesive strips in place. These skin closures may need to stay in place for 2 weeks or longer. If adhesive strip edges start to loosen and curl up, you may trim the loose edges. Do not remove adhesive strips completely unless your health care provider tells you to do that. Check your incision area every day for signs of infection. Check for: More redness, swelling, or pain. More fluid or blood. Warmth. Pus or a bad smell. Wear loose, soft clothing while your incisions heal.  Managing pain and swelling If directed, put ice on the painful or swollen areas. To do this: Put ice in a plastic bag. Place a towel between your skin and the bag. Leave the ice on for 20 minutes, 2-3 times a day. Remove the ice if your skin turns bright red. This is very important. If you cannot feel pain, heat, or cold, you have a greater risk of damage to the area.  Activity Do not lift anything that is heavier than 10 lb (4.5 kg), or the limit that you are told, until your health care provider says that it is safe. Ask your health care provider what activities are safe for you. A lot of activity during the first week after  surgery can increase pain and swelling. For 1 week after your procedure: Avoid activities that take a lot of effort, such as exercise or sports. You may walk and climb stairs as needed for daily activity, but avoid long walks or climbing stairs for exercise. General instructions If you were given a sedative during the procedure, it can affect you for several hours. Do not drive or operate machinery until your health care provider says that it is safe. Do not take baths, swim, or use a hot tub until your health care provider approves. Ask your health care provider if you may take showers. You may only be allowed to take sponge baths. Do not use any products that contain nicotine or tobacco. These products include cigarettes, chewing tobacco, and vaping devices, such as e-cigarettes. If you need help quitting, ask your health care provider. Keep all follow-up visits. This is important. Contact a health care provider if: You have any of these signs of infection: More redness, swelling,  or pain around your incisions or your groin area. More fluid or blood coming from an incision. Warmth coming from an incision. Pus or a bad smell coming from an incision. A fever or chills. You have more swelling in your scrotum, if you are male. You have severe pain and medicines do not help. You have abdominal pain or swelling. You cannot urinate or have a bowel movement. You faint or feel dizzy. You have nausea and vomiting. Get help right away if: You have redness, warmth, or pain in your leg. You have chest pain. You have problems breathing. These symptoms may represent a serious problem that is an emergency. Do not wait to see if the symptoms will go away. Get medical help right away. Call your local emergency services (911 in the [place] Do not drive yourself to the hospital. Summary Pain, swelling, and bruising are common after the procedure. Check your incision area every day for signs of infection,  such as more redness, swelling, or pain. Put ice on painful or swollen areas for 20 minutes, 2-3 times a day. This information is not intended to replace advice given to you by your health care provider. Make sure you discuss any questions you have with your healthcare provider. Document Revised: 09/22/2019 Document Reviewed: 09/22/2019 Elsevier Patient Education  2022 [employer].

## 2020-09-27 NOTE — Progress Notes (Signed)
[facility] POST-OP OFFICE VISIT  09/27/2020  HPI: [name]. is a 75 y.o. male 20 days s/p robotic bilateral inguinal hernia repair.  Reports he may have done a little bit too much too soon, having some swelling and lingering discomfort/pain in the left groin.  Had a CT scan obtained 2 weeks postop.  Otherwise reports has been gradually improving.  Denies any bowel habit changes, nausea, fevers or chills.  Vital signs: BP (!) 164/55   Pulse 71   Temp 98.5 F (36.9 C) (Oral)   Ht '5\' 9"'$  (1.753 m)   Wt 153 lb 9.6 oz (69.7 kg)   SpO2 93%   BMI 22.68 kg/m    Physical Exam: Constitutional: He appears well. Abdomen: Nondistended, soft and benign.  He does have a significant left groin bulge which extends down into his scrotum, consistent with hematoma/seroma with pseudohernia postoperatively.  I reviewed the CT images which confirmed this. Skin: Incisions are clean, dry and intact with Dermabond intact.  CLINICAL DATA:  75 year old male with history of moderately  differentiated adenocarcinoma of the colon post partial seek ectomy.  No complaints by report.   EXAM:  CT CHEST, ABDOMEN, AND PELVIS WITH CONTRAST   TECHNIQUE:  Multidetector CT imaging of the chest, abdomen and pelvis was  performed following the standard protocol during bolus  administration of intravenous contrast.   CONTRAST:  113m OMNIPAQUE IOHEXOL 350 MG/ML SOLN   COMPARISON:  July of 2021.   FINDINGS:  CT CHEST FINDINGS   Cardiovascular: Calcified atheromatous plaque of the thoracic aorta.  No aneurysmal dilation. Three-vessel coronary artery disease. Heart  size moderately enlarged. No pericardial effusion. Central pulmonary  vasculature is unremarkable on venous phase.   Mediastinum/Nodes: No adenopathy in the chest. Esophagus grossly  normal.   Lungs/Pleura: Tiny anterior RIGHT upper lobe pulmonary nodule 5 mm,  unchanged and considered benign. Minimal basilar septal  thickening  and atelectasis without change. No effusion. No consolidative  changes. Airways are patent.   Musculoskeletal: See below for full musculoskeletal detail.   CT ABDOMEN PELVIS FINDINGS   Hepatobiliary: Mild biliary duct dilation is little changed dating  back to August of 2019. No focal, suspicious hepatic lesion. No  pericholecystic stranding. Portal vein is patent. Hepatic veins are  patent.   Pancreas: Mild prominence of main pancreatic duct in the head of the  pancreas with similar appearance dating back to 2019. No signs of  pancreatic inflammation or visible lesion.   Spleen: Low-density lesion in the medial spleen (image 52/2) perhaps  present on the study of July of 2021, less conspicuous but similar  size on the study of August of 2019 and 8 mm, likely benign other  small lesions may also been present though better seen on today's  exam, for instance a stable 8 mm lesion on image 58 of series 2.  Smaller low-density foci are seen in the inferior spleen which are  not as well seen on previous imaging studies.   Adrenals/Urinary Tract: Adrenal glands are normal.   Renal cysts in the bilateral kidneys, no hydronephrosis. Renal  cortical scarring. Urinary bladder with smooth contours under  distended limiting assessment.   Stomach/Bowel: Stomach under distended limiting assessment. No signs  of adjacent inflammation. Small bowel without signs of obstruction.  A small amount of interloop fluid or stranding a seen adjacent to  bowel loops in the RIGHT lower quadrant (image 51/4) not seen on the  most recent comparison. Also seen on image  100 of series 2 measuring  approximately 8 mm greatest thickness in the ileal mesentery in the  RIGHT lower quadrant.   Small amount of fluid and gas in the RIGHT iliac fossa adjacent to  small bowel loops (image 98/2) 1 cm with a small locule of gas just  lateral to the changes of inguinal herniorrhaphy.   Extraperitoneal  thickening along the LEFT lower quadrant adjacent to  small bowel loops (image 106/2) density suggest small hematoma or  mesh. Post partial colectomy along the RIGHT colon.   Vascular/Lymphatic: Patent abdominal vasculature. Calcified and  noncalcified atheromatous plaque in the abdominal aorta. Smooth  contour of the IVC. There is no gastrohepatic or hepatoduodenal  ligament lymphadenopathy. No retroperitoneal or mesenteric  lymphadenopathy.   No pelvic sidewall lymphadenopathy.   Reproductive: LEFT inguinal canal with loculated appearing fluid  containing small amount of gas measuring 9.1 x 5.6 x 4.4 cm,  teardrop shaped larger towards the inferior aspect just above a  LEFT-sided hydrocele which is incompletely imaged. Small amount of  gas along the RIGHT inguinal canal. Prostate unremarkable by CT.   Other: Trace fluid in the pelvis (image 107/2) 26 Hounsfield units   Musculoskeletal: Spinal degenerative changes. No acute or  destructive bone process.   IMPRESSION:  1. Postoperative changes of RIGHT partial colectomy as before. No  definitive signs of new or progressive findings.  2. Large collection containing gas in the LEFT inguinal canal just  above a hydrocele which is partially imaged. This may represent  hematoma and/or seroma following recent bilateral inguinal  herniorrhaphy given appearance. Would correlate with any signs of  infection to ensure this does not reflect a complicated seroma or  infected hematoma.  3. Scattered fluid and gas and suspected hematoma in the pelvis and  in the RIGHT and LEFT iliac fossa presumably related to recent  herniorrhaphy.  4. Extraperitoneal thickening along the LEFT lower quadrant adjacent  to small bowel loops suggest small hematoma or mesh.  5. Low-density foci in the spleen, better seen on today's exam.  Dominant areas appear similar to the prior exam. Smaller areas not  seen on previous imaging. Consider short interval  follow-up at 3-6  months to ensure stability though areas are likely benign, better  seen today due to different phase of contrast enhancement.  6. Tiny anterior RIGHT upper lobe pulmonary nodule, unchanged.  7. Three-vessel coronary artery disease.  8. Aortic atherosclerosis.   These results will be called to the ordering clinician or  representative by the Radiologist Assistant, and communication  documented in the PACS or [employer].    Electronically Signed    By: Zetta Bills M.D.    On: 09/20/2020 16:24  Assessment/Plan: This is a 75 y.o. male 20 days s/p robotic bilateral inguinal hernia repairs, with seroma/hematoma of the left side.  No evidence of recurrence, on exam or imaging.  Patient Active Problem List   Diagnosis Date Noted   Left inguinal hernia 08/23/2020   Anemia in chronic kidney disease 04/08/2019   Stage 3a chronic kidney disease ([place]) 04/08/2019   Hyperlipidemia 04/28/2018   Blistering of skin 01/23/2018   Leg wound, left, initial encounter 01/02/2018   Decreased pedal pulses 01/02/2018   Onychomycosis 01/02/2018   Leukopenia 09/28/2017   Thrombocytopenia ([place]) 09/28/2017   B12 deficiency 09/28/2017   Renal mass 09/10/2017   Diabetes ([place]) 09/10/2017   Leg wound, right, initial encounter 09/10/2017   Anemia 09/12/2015   Effort angina ([place]) 06/09/2015  Coronary artery disease involving native coronary artery of native heart without angina pectoris    Hypertensive heart disease    CAD (coronary artery disease)    Mixed Ischemic and Non-ischemic Cardiomyopathy    Chronic systolic heart failure (HCC)    Bilateral lower extremity edema 02/10/2015   Abdominal pain 02/10/2015   Cough 02/10/2015   Neuropathy ([place]) 09/16/2014   Tubular adenoma of colon 07/20/2014   History of colon cancer 07/20/2014   Benign colon polyp 07/20/2014   Hypertension 07/20/2014   Hyperplastic colon polyp 07/20/2014    -We discussed easing into his activity levels.   I do not believe walking and exercising will exacerbate this process.  He still should be cautious in lifting.  I will have him follow-up in 1 month to ensure progress.  I will be glad to see him as needed in the interim.   [name] M.D., FACS 09/27/2020, 11:52 AM

## 2020-10-25 DIAGNOSIS — I10 Essential (primary) hypertension: Secondary | ICD-10-CM | POA: Diagnosis not present

## 2020-10-25 DIAGNOSIS — N1831 Chronic kidney disease, stage 3a: Secondary | ICD-10-CM | POA: Diagnosis not present

## 2020-10-25 DIAGNOSIS — D631 Anemia in chronic kidney disease: Secondary | ICD-10-CM | POA: Diagnosis not present

## 2020-10-25 DIAGNOSIS — E1122 Type 2 diabetes mellitus with diabetic chronic kidney disease: Secondary | ICD-10-CM | POA: Diagnosis not present

## 2020-11-01 ENCOUNTER — Ambulatory Visit (INDEPENDENT_AMBULATORY_CARE_PROVIDER_SITE_OTHER): Payer: PPO | Admitting: Surgery

## 2020-11-01 ENCOUNTER — Other Ambulatory Visit: Payer: Self-pay

## 2020-11-01 ENCOUNTER — Inpatient Hospital Stay: Payer: PPO

## 2020-11-01 ENCOUNTER — Inpatient Hospital Stay: Payer: PPO | Attending: Oncology

## 2020-11-01 VITALS — BP 138/75 | HR 61 | Temp 98.3°F | Ht 69.0 in | Wt 159.0 lb

## 2020-11-01 DIAGNOSIS — E538 Deficiency of other specified B group vitamins: Secondary | ICD-10-CM | POA: Insufficient documentation

## 2020-11-01 DIAGNOSIS — D649 Anemia, unspecified: Secondary | ICD-10-CM | POA: Diagnosis not present

## 2020-11-01 DIAGNOSIS — Z85038 Personal history of other malignant neoplasm of large intestine: Secondary | ICD-10-CM | POA: Insufficient documentation

## 2020-11-01 DIAGNOSIS — N433 Hydrocele, unspecified: Secondary | ICD-10-CM | POA: Insufficient documentation

## 2020-11-01 LAB — COMPREHENSIVE METABOLIC PANEL
ALT: 15 U/L (ref 0–44)
AST: 15 U/L (ref 15–41)
Albumin: 3.5 g/dL (ref 3.5–5.0)
Alkaline Phosphatase: 68 U/L (ref 38–126)
Anion gap: 7 (ref 5–15)
BUN: 15 mg/dL (ref 8–23)
CO2: 28 mmol/L (ref 22–32)
Calcium: 8.9 mg/dL (ref 8.9–10.3)
Chloride: 106 mmol/L (ref 98–111)
Creatinine, Ser: 1.28 mg/dL — ABNORMAL HIGH (ref 0.61–1.24)
GFR, Estimated: 59 mL/min — ABNORMAL LOW (ref >60)
Glucose, Bld: 106 mg/dL — ABNORMAL HIGH (ref 70–99)
Potassium: 3.8 mmol/L (ref 3.5–5.1)
Sodium: 141 mmol/L (ref 135–145)
Total Bilirubin: 1 mg/dL (ref 0.3–1.2)
Total Protein: 6.9 g/dL (ref 6.5–8.1)

## 2020-11-01 LAB — CBC WITH DIFFERENTIAL/PLATELET
Abs Immature Granulocytes: 0.01 10*3/uL (ref 0.00–0.07)
Basophils Absolute: 0 10*3/uL (ref 0.0–0.1)
Basophils Relative: 1 %
Eosinophils Absolute: 0.2 10*3/uL (ref 0.0–0.5)
Eosinophils Relative: 5 %
HCT: 29.5 % — ABNORMAL LOW (ref 39.0–52.0)
Hemoglobin: 10 g/dL — ABNORMAL LOW (ref 13.0–17.0)
Immature Granulocytes: 0 %
Lymphocytes Relative: 15 %
Lymphs Abs: 0.5 10*3/uL — ABNORMAL LOW (ref 0.7–4.0)
MCH: 33.2 pg (ref 26.0–34.0)
MCHC: 33.9 g/dL (ref 30.0–36.0)
MCV: 98 fL (ref 80.0–100.0)
Monocytes Absolute: 0.4 10*3/uL (ref 0.1–1.0)
Monocytes Relative: 13 %
Neutro Abs: 2 10*3/uL (ref 1.7–7.7)
Neutrophils Relative %: 66 %
Platelets: 113 10*3/uL — ABNORMAL LOW (ref 150–400)
RBC: 3.01 MIL/uL — ABNORMAL LOW (ref 4.22–5.81)
RDW: 14.5 % (ref 11.5–15.5)
WBC: 3.1 10*3/uL — ABNORMAL LOW (ref 4.0–10.5)
nRBC: 0 % (ref 0.0–0.2)

## 2020-11-01 LAB — VITAMIN B12: Vitamin B-12: 250 pg/mL (ref 180–914)

## 2020-11-01 MED ORDER — CYANOCOBALAMIN 1000 MCG/ML IJ SOLN
1000.0000 ug | INTRAMUSCULAR | Status: DC
  Administered 2020-11-01: 1000 ug via INTRAMUSCULAR
  Filled 2020-11-01: qty 1

## 2020-11-01 NOTE — Progress Notes (Signed)
[facility] POST-OP OFFICE VISIT  11/01/2020  HPI: [name]. is a 75 y.o. male 20 days s/p robotic assisted bilateral inguinal hernia repairs.  Reports he still has the mass in the left groin left scrotal area.  He denies that it bothers him at all.  He reports that it does not change with lifting, coughing at all.  Denies tenderness.  Vital signs: BP 138/75   Pulse 61   Temp 98.3 F (36.8 C)   Ht 5\' 9"  (1.753 m)   Wt 159 lb (72.1 kg)   SpO2 98%   BMI 23.48 kg/m    Physical Exam: Constitutional: He appears well. Abdomen: His abdomen is flat, soft and nontender.  There is a large left groin mass which extends into the left scrotum, consistent with his prior hernia.  There is no change with his attempts to cough.  There is a certain tension there consistent with a noncommunicating hydrocele.  I applied our ultrasound to it and it appears to be an anechoic fluid present. Skin: Incisions are healed well.  There is no evidence of inflammation or irritation in the groin or scrotum either.  Assessment/Plan: This is a 75 y.o. male 33 days s/p robotic bilateral inguinal hernias.  Prominent large pseudohernia of the left groin and scrotum consistent with a postoperative hydrocele.  I offered him referral to urology for drainage.  He reports it does not bother him and would rather just leave it alone.  Patient Active Problem List   Diagnosis Date Noted   Status post laparoscopic hernia repair 09/27/2020   Anemia in chronic kidney disease 04/08/2019   Stage 3a chronic kidney disease ([place]) 04/08/2019   Hyperlipidemia 04/28/2018   Blistering of skin 01/23/2018   Leg wound, left, initial encounter 01/02/2018   Decreased pedal pulses 01/02/2018   Onychomycosis 01/02/2018   Leukopenia 09/28/2017   Thrombocytopenia ([place]) 09/28/2017   B12 deficiency 09/28/2017   Renal mass 09/10/2017   Diabetes ([place]) 09/10/2017   Leg wound, right, initial encounter 09/10/2017   Anemia  09/12/2015   Effort angina ([place]) 06/09/2015   Coronary artery disease involving native coronary artery of native heart without angina pectoris    Hypertensive heart disease    CAD (coronary artery disease)    Mixed Ischemic and Non-ischemic Cardiomyopathy    Chronic systolic heart failure ([place])    Bilateral lower extremity edema 02/10/2015   Abdominal pain 02/10/2015   Cough 02/10/2015   Neuropathy ([place]) 09/16/2014   Tubular adenoma of colon 07/20/2014   History of colon cancer 07/20/2014   Benign colon polyp 07/20/2014   Hypertension 07/20/2014   Hyperplastic colon polyp 07/20/2014    -I indicated that I will be glad to see him again should there be any change in the discomfort/tenderness of the left groin and scrotal area.  He feels content at this stage grateful that he is free of pain, and is not looking at getting this hydrocele drained at this time.  We will be glad to assist him anytime in the future.   Ronny Bacon M.D., FACS 11/01/2020, 9:40 AM

## 2020-11-01 NOTE — Patient Instructions (Signed)
Please call the office for any questions or concerns

## 2020-11-02 LAB — CEA: CEA: 1.2 ng/mL (ref 0.0–4.7)

## 2020-11-28 ENCOUNTER — Other Ambulatory Visit: Payer: Self-pay

## 2020-11-28 DIAGNOSIS — E538 Deficiency of other specified B group vitamins: Secondary | ICD-10-CM

## 2020-11-29 ENCOUNTER — Encounter: Payer: Self-pay | Admitting: Oncology

## 2020-11-29 ENCOUNTER — Inpatient Hospital Stay: Payer: PPO | Attending: Oncology

## 2020-11-29 ENCOUNTER — Inpatient Hospital Stay: Payer: PPO | Admitting: Oncology

## 2020-11-29 ENCOUNTER — Other Ambulatory Visit: Payer: Self-pay

## 2020-11-29 VITALS — BP 124/72 | HR 52 | Temp 97.4°F | Resp 18 | Wt 165.8 lb

## 2020-11-29 DIAGNOSIS — C187 Malignant neoplasm of sigmoid colon: Secondary | ICD-10-CM | POA: Diagnosis not present

## 2020-11-29 DIAGNOSIS — E538 Deficiency of other specified B group vitamins: Secondary | ICD-10-CM | POA: Diagnosis not present

## 2020-11-29 DIAGNOSIS — D649 Anemia, unspecified: Secondary | ICD-10-CM | POA: Diagnosis not present

## 2020-11-29 DIAGNOSIS — D696 Thrombocytopenia, unspecified: Secondary | ICD-10-CM

## 2020-11-29 DIAGNOSIS — N1831 Chronic kidney disease, stage 3a: Secondary | ICD-10-CM | POA: Diagnosis not present

## 2020-11-29 DIAGNOSIS — D631 Anemia in chronic kidney disease: Secondary | ICD-10-CM

## 2020-11-29 DIAGNOSIS — Z85038 Personal history of other malignant neoplasm of large intestine: Secondary | ICD-10-CM | POA: Insufficient documentation

## 2020-11-29 MED ORDER — FERROUS SULFATE 325 (65 FE) MG PO TBEC: 325.0000 mg | DELAYED_RELEASE_TABLET | Freq: Two times a day (BID) | ORAL | 4 refills | Status: DC

## 2020-11-29 MED ORDER — DOCUSATE SODIUM 100 MG PO CAPS: 100.0000 mg | ORAL_CAPSULE | Freq: Every day | ORAL | 5 refills | Status: DC | PRN

## 2020-11-29 MED ORDER — CYANOCOBALAMIN 1000 MCG/ML IJ SOLN
1000.0000 ug | INTRAMUSCULAR | Status: DC
  Administered 2020-11-29: 1000 ug via INTRAMUSCULAR
  Filled 2020-11-29: qty 1

## 2020-11-29 NOTE — Progress Notes (Signed)
[name]  Chief Complaint: [name]. is a 75 y.o. male with a history of stage IIIC colon cancer, pancytopenia, and B12 deficiency who presented to follow-up for management of history of colon cancer and vitamin B12 deficiency.   PERTINENT ONCOLOGY HISTORY Patient previous oncology care was with Dr. Mike Gip who has moved practice to [facility] Patient switches care to me on 02/10/2018  # stage IIIC colon cancer.  He presented with symptomatic anemia.  He underwent descending and proximal sigmoid colectomy, left ureteral lysis, and partial cecectomy on 06/23/2013. Six of 22 lymph nodes were positive.  There were tumor deposits (discontinuous extramural extension).  There was lymphovascular and perineural invasion. Pathologic stage was IIIC (T4bN2a M0). He received FOLFOX chemotherapy from 07/22/2013 until 01/06/2014.    Chest, abdomen, and pelvic CT on 01/12/2016 revealed no evidence of recurrent or metastatic carcinoma.  Abdomen and pelvic CT with and without contrast on 09/13/2017 revealed no multiple simple renal cysts.  Liver was unremarkable.  Colonoscopy on 04/12/2014 revealed several polyps.  There was no dysplasia or malignancy.  Colonoscopy on 12/11/2016 revealed 10 polyps (2-8 mm) in the ascending proximal transverse, descending, proximal sigmoid and rectum.  Pathology revealed multiple tubular adenomas without dysplasia or malignancy.  He was diagnosed with pyelonephritis.  He is undergoing evaluation for hematuria.  CT scan was negative.  Cystoscopy on 10/03/2017 revealed prostate enlargement and no evidence of bladder pathology.  # He has a mild pancytopenia.  He received a course of Septra (08/26/2017 - 09/02/2017).  He has a history of a mild normocytic anemia.  Diet is modest.  He denies herbal products.  He denies any melena or hematochezia.  Work-up on 09/27/2017 revealed a hematocrit 31.1, hemoglobin 10.6, MCV 94, platelets 141,000, WBC  2700 with an ANC of 1600.  Creatinine was 1.32.  He has B12 deficiency.  B12 was low (149).  Anti-parietal cell antibody and intrinsic factor antibody were negative.   Negative studies included:  folate (12.3), TSH, hepatitis B core antibody total, and hepatitis C antibody. Retic was 1.8%.  Ferritin was 103 with a iron saturation of 25% and a TIBC of 201.   Creatinine was 1.32.  He has B12 deficiency.  B12 was 149 on 09/27/2017 then 322 on 11/04/2017 and 227 on 12/05/2017 on oral B12.  Echocardiogram revealed an EF was 20-25% on 02/18/2015.  Cardiac catheterization on 02/24/2015 revealed 80% occlusion of the proximal left circumflex.  He managed medically.   INTERVAL HISTORY Christopher Arts. is a 75 y.o. male who has above history reviewed by me today presents for follow up visit for management of history of colon cancer and vitamin B12 deficiency Problems and complaints are listed below:  09/07/2020 patient had bilateral inguinal hernia repair with mesh.  By Dr. Christian Mate 09/20/2020, CT chest abdomen pelvis for colon cancer follow-up showed no definitive signs of new/progressive findings Large collection containing gas on the left inguinal canal.  Scattered fluid and gas suspected hematoma in the pelvis and right and left iliac fossa.  Extraperitoneal thickening along the lower left quadrant adjacent to small bowel loops suggesting small hematoma or mass.  Low-density foci in the spleen.  Consider short-term follow-up 3 to 6 months to ensure stability.  Tiny anterior right upper lobe pulmonary nodule unchanged.  Three-vessel coronary artery disease.  Aortic atherosclerosis. Patient was followed up by Dr. Atha Starks.  He was found to have a primary large central hernia of the left groin and  scrotum consistent with a postoperative hydrocele.  Patient declined urology evaluation for drainage.  Today patient reports feeling well.  He denies any unintentional weight loss, night sweats, fever, abdominal  pain. Past Medical History:  Diagnosis Date   Adenocarcinoma of colon ([place]) 06/19/2013   a.) moderately differentiated stage IIIc (T4bN2a M0) adenocarcinoma of colon  s/p colectomy followed by chemoRx with oxaliplatin & fluorouacil.   Anemia    CAD (coronary artery disease)    a. 02/2015 Cath: LM nl, LAD 50/40 mid/distal, LCX 80p, RCA 40p, 30d, RPL1 40, CO 3.27, CI 1.65; b. cath 06/09/15: LM minor irregs, m-dLAD 50%, dLAD 40%, pLCx 80% s/p PCI/DES 0%, pRCA 40%, dRCA 30%, 1st RPLB 40%   Chronic kidney disease, stage 3a (HCC)    Chronic systolic CHF (congestive heart failure) ([place])    a. 02/2015 Echo: EF 20-25%, diff HK, mod MR, mildy dil LA, mildly dil RV w/ mod RV syst dysfxn, mildly dil RA, mod TR, PASP 27mmHg; b. 09/2017 Echo: EF 40-45%, diff HK. Gr1 DD. Mild to mod MR. Mildly to mod dil LA.   Dia hypertension 07/20/2014   Diabetes mellitus without complication (HCC)    Hx of heart artery stent 06/09/2015   3.0 x 12 mm Xience Alpine DES x 1 to LCx   Hypertension    Hypertensive heart disease    Left inguinal hernia 05/2020   Mixed Ischemic and Non-ischemic Cardiomyopathy    a.) 02/2015 EF 20-25%, diff HK; b.) 09/2016 EF 40-45%; c.) 09/2019 EF 25-30%   Moderate mitral regurgitation    a. 09/2017 Echo: Mild to mod TR.   Pulmonary hypertension (HCC)    Tubular adenoma of colon     Past Surgical History:  Procedure Laterality Date   APPENDECTOMY N/A 06/19/2017   Location: [facility]; Surgeon: Consuela Mimes, MD   CARDIAC CATHETERIZATION Bilateral 02/24/2015   Procedure: Right/Left Heart Cath and Coronary Angiography;  Surgeon: Wellington Hampshire, MD;  Location: [place] [facility];  Service: Cardiovascular;  Laterality: Bilateral;   CARDIAC CATHETERIZATION N/A 06/09/2015   Procedure: Coronary Stent Intervention (3.0 x 12 mm Xience Alpine DES to LCx);  Surgeon: Wellington Hampshire, MD;  Location: [place] [facility];  Service: Cardiovascular;  Laterality: N/A   COLECTOMY  06/19/2017    Descending and proximal sigmoid colectomy, LEFT ureterolysis, partial cecectomy, appendectomy; Location: [facility]; Surgeon: Consuela Mimes, MD   COLONOSCOPY     COLONOSCOPY WITH PROPOFOL N/A 12/11/2016   Procedure: COLONOSCOPY WITH PROPOFOL;  Surgeon: Lollie Sails, MD;  Location: [facility];  Service: Endoscopy;  Laterality: N/A;   ESOPHAGOGASTRODUODENOSCOPY     PORT-A-CATH REMOVAL N/A 07/12/2014   Procedure: REMOVAL PORT-A-CATH;  Surgeon: Molly Maduro, MD;  Location: [facility];  Service: General;  Laterality: N/A;   PORTACATH PLACEMENT Left 2015   Family History  Problem Relation Age of Onset   Cancer Mother    Social History   Socioeconomic History   Marital status: Married    Spouse name: Romie Minus   Number of children: 4   Years of education: Not on file   Highest education level: Not on file  Occupational History   Not on file  Tobacco Use   Smoking status: Never   Smokeless tobacco: Never  Vaping Use   Vaping Use: Never used  Substance and Sexual Activity   Alcohol use: No   Drug use: No   Sexual activity: Yes  Other Topics Concern   Not on file  Social History Narrative  Not on file   Social Determinants of Health   Financial Resource Strain: Not on file  Food Insecurity: Not on file  Transportation Needs: Not on file  Physical Activity: Not on file  Stress: Not on file  Social Connections: Not on file  Intimate Partner Violence: Not on file    Allergies:  Allergies  Allergen Reactions   Shellfish Allergy Other (See Comments)    Pt. instructed by MD to avoid seafood    Current Medications: Current Outpatient Medications  Medication Sig Dispense Refill   amLODipine (NORVASC) 2.5 MG tablet Take by mouth.     aspirin EC 81 MG tablet Take 1 tablet (81 mg total) by mouth daily. 90 tablet 3   atorvastatin (LIPITOR) 40 MG tablet TAKE 1 TABLET BY MOUTH EVERY DAY 90 tablet 3   carvedilol (COREG) 3.125 MG tablet TAKE 1 TABLET(3.125 MG) BY MOUTH  TWICE DAILY 180 tablet 3   cholecalciferol (VITAMIN D3) 25 MCG (1000 UNIT) tablet Take 1,000 Units by mouth daily.     docusate sodium (COLACE) 100 MG capsule Take 1 capsule (100 mg total) by mouth daily as needed for mild constipation. 30 capsule 5   ENTRESTO 24-26 MG TAKE 1 TABLET BY MOUTH TWICE DAILY 60 tablet 5   ferrous sulfate 325 (65 FE) MG EC tablet Take 1 tablet (325 mg total) by mouth 2 (two) times daily with a meal. 60 tablet 4   furosemide (LASIX) 20 MG tablet Take 1 tablet (20 mg total) by mouth daily. 30 tablet 6   gabapentin (NEURONTIN) 100 MG capsule Take 100 mg by mouth daily in the afternoon.     HYDROcodone-acetaminophen (NORCO/VICODIN) 5-325 MG tablet Take 1 tablet by mouth every 6 (six) hours as needed for moderate pain. 15 tablet 0   ibuprofen (ADVIL) 800 MG tablet Take 1 tablet (800 mg total) by mouth every 8 (eight) hours as needed. 30 tablet 0   metFORMIN (GLUCOPHAGE) 500 MG tablet TAKE 1 TABLET(500 MG) BY MOUTH TWICE DAILY WITH A MEAL 180 tablet 3   spironolactone (ALDACTONE) 25 MG tablet TAKE 1 TABLET(25 MG) BY MOUTH DAILY 30 tablet 0   tamsulosin (FLOMAX) 0.4 MG CAPS capsule TAKE 1 CAPSULE(0.4 MG) BY MOUTH DAILY AFTER SUPPER (Patient taking differently: Take 0.4 mg by mouth daily after breakfast.) 30 capsule 11   No current facility-administered medications for this visit.    Review of Systems  Constitutional:  Negative for chills, fever, malaise/fatigue and weight loss.  HENT:  Negative for sore throat.   Eyes:  Negative for redness.  Respiratory:  Negative for cough, shortness of breath and wheezing.   Cardiovascular:  Negative for chest pain, palpitations and leg swelling.  Gastrointestinal:  Negative for abdominal pain, blood in stool, nausea and vomiting.  Genitourinary:  Negative for dysuria.  Musculoskeletal:  Negative for myalgias.  Skin:  Negative for rash.  Neurological:  Negative for dizziness, tingling and tremors.  Endo/Heme/Allergies:  Does not  bruise/bleed easily.  Psychiatric/Behavioral:  Negative for hallucinations.   Performance status (ECOG): 0  Vital Signs: BP 124/72 (BP Location: Left Arm, Patient Position: Sitting)   Pulse (!) 52   Temp (!) 97.4 F (36.3 C)   Resp 18   Wt 165 lb 12.8 oz (75.2 kg)   SpO2 98%   BMI 24.48 kg/m   Physical Exam Constitutional:      General: He is not in acute distress.    Appearance: He is not diaphoretic.  HENT:  Head: Normocephalic and atraumatic.     Nose: Nose normal.     Mouth/Throat:     Pharynx: No oropharyngeal exudate.  Eyes:     General: No scleral icterus.    Pupils: Pupils are equal, round, and reactive to light.  Cardiovascular:     Rate and Rhythm: Normal rate and regular rhythm.     Heart sounds: No murmur heard. Pulmonary:     Effort: Pulmonary effort is normal. No respiratory distress.     Breath sounds: No rales.  Chest:     Chest wall: No tenderness.  Abdominal:     General: There is no distension.     Palpations: Abdomen is soft.     Tenderness: There is no abdominal tenderness.  Musculoskeletal:        General: Normal range of motion.     Cervical back: Normal range of motion and neck supple.     Comments: Chronic lower extremity edema +1  Skin:    General: Skin is warm and dry.     Findings: No erythema.     Comments: Chronic dermatological changes due to vein insufficiency  Neurological:     Mental Status: He is alert and oriented to person, place, and time.     Cranial Nerves: No cranial nerve deficit.     Motor: No abnormal muscle tone.     Coordination: Coordination normal.  Psychiatric:        Mood and Affect: Affect normal.        Judgment: Judgment normal.   RADIOGRAPHIC STUDIES: I have personally reviewed the radiological images as listed and agreed with the findings in the report. CT CHEST ABDOMEN PELVIS W CONTRAST  Result Date: 09/20/2020 CLINICAL DATA:  75 year old male with history of moderately differentiated adenocarcinoma  of the colon post partial seek ectomy. No complaints by report. EXAM: CT CHEST, ABDOMEN, AND PELVIS WITH CONTRAST TECHNIQUE: Multidetector CT imaging of the chest, abdomen and pelvis was performed following the standard protocol during bolus administration of intravenous contrast. CONTRAST:  128mL OMNIPAQUE IOHEXOL 350 MG/ML SOLN COMPARISON:  July of 2021. FINDINGS: CT CHEST FINDINGS Cardiovascular: Calcified atheromatous plaque of the thoracic aorta. No aneurysmal dilation. Three-vessel coronary artery disease. Heart size moderately enlarged. No pericardial effusion. Central pulmonary vasculature is unremarkable on venous phase. Mediastinum/Nodes: No adenopathy in the chest. Esophagus grossly normal. Lungs/Pleura: Tiny anterior RIGHT upper lobe pulmonary nodule 5 mm, unchanged and considered benign. Minimal basilar septal thickening and atelectasis without change. No effusion. No consolidative changes. Airways are patent. Musculoskeletal: See below for full musculoskeletal detail. CT ABDOMEN PELVIS FINDINGS Hepatobiliary: Mild biliary duct dilation is little changed dating back to August of 2019. No focal, suspicious hepatic lesion. No pericholecystic stranding. Portal vein is patent. Hepatic veins are patent. Pancreas: Mild prominence of main pancreatic duct in the head of the pancreas with similar appearance dating back to 2019. No signs of pancreatic inflammation or visible lesion. Spleen: Low-density lesion in the medial spleen (image 52/2) perhaps present on the study of July of 2021, less conspicuous but similar size on the study of August of 2019 and 8 mm, likely benign other small lesions may also been present though better seen on today's exam, for instance a stable 8 mm lesion on image 58 of series 2. Smaller low-density foci are seen in the inferior spleen which are not as well seen on previous imaging studies. Adrenals/Urinary Tract: Adrenal glands are normal. Renal cysts in the bilateral kidneys, no  hydronephrosis. Renal cortical  scarring. Urinary bladder with smooth contours under distended limiting assessment. Stomach/Bowel: Stomach under distended limiting assessment. No signs of adjacent inflammation. Small bowel without signs of obstruction. A small amount of interloop fluid or stranding a seen adjacent to bowel loops in the RIGHT lower quadrant (image 51/4) not seen on the most recent comparison. Also seen on image 100 of series 2 measuring approximately 8 mm greatest thickness in the ileal mesentery in the RIGHT lower quadrant. Small amount of fluid and gas in the RIGHT iliac fossa adjacent to small bowel loops (image 98/2) 1 cm with a small locule of gas just lateral to the changes of inguinal herniorrhaphy. Extraperitoneal thickening along the LEFT lower quadrant adjacent to small bowel loops (image 106/2) density suggest small hematoma or mesh. Post partial colectomy along the RIGHT colon. Vascular/Lymphatic: Patent abdominal vasculature. Calcified and noncalcified atheromatous plaque in the abdominal aorta. Smooth contour of the IVC. There is no gastrohepatic or hepatoduodenal ligament lymphadenopathy. No retroperitoneal or mesenteric lymphadenopathy. No pelvic sidewall lymphadenopathy. Reproductive: LEFT inguinal canal with loculated appearing fluid containing small amount of gas measuring 9.1 x 5.6 x 4.4 cm, teardrop shaped larger towards the inferior aspect just above a LEFT-sided hydrocele which is incompletely imaged. Small amount of gas along the RIGHT inguinal canal. Prostate unremarkable by CT. Other: Trace fluid in the pelvis (image 107/2) 26 Hounsfield units Musculoskeletal: Spinal degenerative changes. No acute or destructive bone process. IMPRESSION: 1. Postoperative changes of RIGHT partial colectomy as before. No definitive signs of new or progressive findings. 2. Large collection containing gas in the LEFT inguinal canal just above a hydrocele which is partially imaged. This may  represent hematoma and/or seroma following recent bilateral inguinal herniorrhaphy given appearance. Would correlate with any signs of infection to ensure this does not reflect a complicated seroma or infected hematoma. 3. Scattered fluid and gas and suspected hematoma in the pelvis and in the RIGHT and LEFT iliac fossa presumably related to recent herniorrhaphy. 4. Extraperitoneal thickening along the LEFT lower quadrant adjacent to small bowel loops suggest small hematoma or mesh. 5. Low-density foci in the spleen, better seen on today's exam. Dominant areas appear similar to the prior exam. Smaller areas not seen on previous imaging. Consider short interval follow-up at 3-6 months to ensure stability though areas are likely benign, better seen today due to different phase of contrast enhancement. 6. Tiny anterior RIGHT upper lobe pulmonary nodule, unchanged. 7. Three-vessel coronary artery disease. 8. Aortic atherosclerosis. These results will be called to the ordering clinician or representative by the Radiologist Assistant, and communication documented in the PACS or [employer]. Electronically Signed   By: Zetta Bills M.D.   On: 09/20/2020 16:24      Labs CBC Latest Ref Rng & Units 11/01/2020 08/22/2020 08/19/2019  WBC 4.0 - 10.5 K/uL 3.1(L) 4.1 3.2(L)  Hemoglobin 13.0 - 17.0 g/dL 10.0(L) 10.7(L) 12.2(L)  Hematocrit 39.0 - 52.0 % 29.5(L) 31.0(L) 36.1(L)  Platelets 150 - 400 K/uL 113(L) 114(L) 132(L)   CMP Latest Ref Rng & Units 11/01/2020 09/01/2020 08/22/2020  Glucose 70 - 99 mg/dL 106(H) 96 163(H)  BUN 8 - 23 mg/dL 15 13 14   Creatinine 0.61 - 1.24 mg/dL 1.28(H) 1.34 1.37(H)  Sodium 135 - 145 mmol/L 141 140 139  Potassium 3.5 - 5.1 mmol/L 3.8 4.0 3.1(L)  Chloride 98 - 111 mmol/L 106 104 104  CO2 22 - 32 mmol/L 28 28 26   Calcium 8.9 - 10.3 mg/dL 8.9 9.3 8.7(L)  Total Protein 6.5 -  8.1 g/dL 6.9 - 7.2  Total Bilirubin 0.3 - 1.2 mg/dL 1.0 - 1.0  Alkaline Phos 38 - 126 U/L 68 - 65  AST 15  - 41 U/L 15 - 15  ALT 0 - 44 U/L 15 - 7   CEA has been followed:  4.3 on 06/20/2013, 1.8 on 01/06/2014, 1.5 on 09/16/2014, 1.1 on 01/14/2015, 1.5 on 05/16/2015, 1.6 on 09/12/2015, 1.3 on 01/20/2016, 1.6 on 08/24/2016, 1.2 on 01/09/2017, and 1.1 on 09/27/2017.  Assessment:  Christopher Groeneveld. is a 75 y.o. male follows up for management of pancytopenia, vitamin B12 deficiency and stage IIIc colon cancer. 1. Malignant neoplasm of sigmoid colon ([name])   2. B12 deficiency   3. Thrombocytopenia ([name])   4. Anemia in stage 3a chronic kidney disease ([name])    # Stage IIIC colon cancer:  Clinically patient is doing well.  Labs reviewed and discussed with patient.  CEA stable CT images in August 2022 were reviewed and discussed with patient. No metastatic disease or progression.  #Low-density lesion in the spleen.  I will repeat a CT in 6 months per recommendation of the radiologist. If next CT is normal, I will hold off additional CT scans.   #Anemia/thrombocytopenia/leukopenia, Chronic problem for him.   Patient's counts are stable.  Hemoglobin 10, WBC 3.1, platelet count 113.  Continue observation.  Anemia due to chronic kidney disease.  I recommend patient to start taking ferrous sulfate 325 mg twice daily.  Repeat iron panel in the next visit.  #History of vitamin B12 deficiency, 11/01/2020 vitamin B12 level is 250.  Proceed with vitamin B12 injection today. Continue monthly vitamin B12   Orders Placed This Encounter  Procedures   CT Abdomen Pelvis Wo Contrast    Standing Status:   Future    Standing Expiration Date:   11/29/2021    Order Specific Question:   Preferred imaging location?    Answer:    [facility]    Order Specific Question:   Is Oral Contrast requested for this exam?    Answer:   Yes, Per Radiology protocol  = RTC  monthly Vitamin B12 injections, 6 months for follow-up assessment.  Check CBC, CMP, CEA, vitamin B12   Earlie Server, MD, PhD Hematology Oncology [facility] at [facility] Pager- [phone] 11/29/2020

## 2020-11-29 NOTE — Progress Notes (Signed)
Pt here for follow up. No new concerns voiced.   

## 2020-12-27 ENCOUNTER — Other Ambulatory Visit: Payer: Self-pay

## 2020-12-27 ENCOUNTER — Inpatient Hospital Stay: Payer: PPO | Attending: Oncology

## 2020-12-27 DIAGNOSIS — D649 Anemia, unspecified: Secondary | ICD-10-CM | POA: Diagnosis not present

## 2020-12-27 DIAGNOSIS — Z85038 Personal history of other malignant neoplasm of large intestine: Secondary | ICD-10-CM | POA: Diagnosis not present

## 2020-12-27 DIAGNOSIS — E538 Deficiency of other specified B group vitamins: Secondary | ICD-10-CM | POA: Insufficient documentation

## 2020-12-27 MED ORDER — CYANOCOBALAMIN 1000 MCG/ML IJ SOLN
1000.0000 ug | INTRAMUSCULAR | Status: DC
  Administered 2020-12-27: 1000 ug via INTRAMUSCULAR
  Filled 2020-12-27: qty 1

## 2021-01-23 DIAGNOSIS — E1122 Type 2 diabetes mellitus with diabetic chronic kidney disease: Secondary | ICD-10-CM | POA: Diagnosis not present

## 2021-01-23 DIAGNOSIS — N1831 Chronic kidney disease, stage 3a: Secondary | ICD-10-CM | POA: Diagnosis not present

## 2021-01-23 DIAGNOSIS — R809 Proteinuria, unspecified: Secondary | ICD-10-CM | POA: Diagnosis not present

## 2021-01-23 DIAGNOSIS — I10 Essential (primary) hypertension: Secondary | ICD-10-CM | POA: Diagnosis not present

## 2021-01-23 DIAGNOSIS — D631 Anemia in chronic kidney disease: Secondary | ICD-10-CM | POA: Diagnosis not present

## 2021-01-31 ENCOUNTER — Inpatient Hospital Stay: Payer: PPO | Attending: Oncology

## 2021-01-31 ENCOUNTER — Other Ambulatory Visit: Payer: Self-pay

## 2021-01-31 DIAGNOSIS — E538 Deficiency of other specified B group vitamins: Secondary | ICD-10-CM | POA: Insufficient documentation

## 2021-01-31 MED ORDER — CYANOCOBALAMIN 1000 MCG/ML IJ SOLN
1000.0000 ug | INTRAMUSCULAR | Status: DC
  Administered 2021-01-31: 14:00:00 1000 ug via INTRAMUSCULAR
  Filled 2021-01-31: qty 1

## 2021-03-03 ENCOUNTER — Inpatient Hospital Stay: Payer: PPO | Attending: Oncology

## 2021-03-10 ENCOUNTER — Other Ambulatory Visit: Payer: Self-pay

## 2021-03-10 ENCOUNTER — Ambulatory Visit (INDEPENDENT_AMBULATORY_CARE_PROVIDER_SITE_OTHER): Payer: PPO | Admitting: Family Medicine

## 2021-03-10 ENCOUNTER — Encounter: Payer: Self-pay | Admitting: Family Medicine

## 2021-03-10 VITALS — BP 120/80 | HR 72 | Temp 97.5°F | Ht 69.0 in | Wt 174.5 lb

## 2021-03-10 DIAGNOSIS — S81802A Unspecified open wound, left lower leg, initial encounter: Secondary | ICD-10-CM

## 2021-03-10 DIAGNOSIS — E119 Type 2 diabetes mellitus without complications: Secondary | ICD-10-CM | POA: Diagnosis not present

## 2021-03-10 DIAGNOSIS — E785 Hyperlipidemia, unspecified: Secondary | ICD-10-CM

## 2021-03-10 DIAGNOSIS — I5022 Chronic systolic (congestive) heart failure: Secondary | ICD-10-CM

## 2021-03-10 DIAGNOSIS — I10 Essential (primary) hypertension: Secondary | ICD-10-CM

## 2021-03-10 DIAGNOSIS — Z85038 Personal history of other malignant neoplasm of large intestine: Secondary | ICD-10-CM

## 2021-03-10 LAB — HEMOGLOBIN A1C: Hgb A1c MFr Bld: 6.5 % (ref 4.6–6.5)

## 2021-03-10 LAB — BASIC METABOLIC PANEL
BUN: 25 mg/dL — ABNORMAL HIGH (ref 6–23)
CO2: 28 mEq/L (ref 19–32)
Calcium: 8.9 mg/dL (ref 8.4–10.5)
Chloride: 110 mEq/L (ref 96–112)
Creatinine, Ser: 1.49 mg/dL (ref 0.40–1.50)
GFR: 46.04 mL/min — ABNORMAL LOW (ref >60.00)
Glucose, Bld: 124 mg/dL — ABNORMAL HIGH (ref 70–99)
Potassium: 4 mEq/L (ref 3.5–5.1)
Sodium: 146 mEq/L — ABNORMAL HIGH (ref 135–145)

## 2021-03-10 LAB — LIPID PANEL
Cholesterol: 113 mg/dL (ref 0–200)
HDL: 45.2 mg/dL (ref >39.00)
LDL Cholesterol: 56 mg/dL (ref 0–99)
NonHDL: 67.87
Total CHOL/HDL Ratio: 3
Triglycerides: 58 mg/dL (ref 0.0–149.0)
VLDL: 11.6 mg/dL (ref 0.0–40.0)

## 2021-03-10 LAB — MICROALBUMIN / CREATININE URINE RATIO
Creatinine,U: 237.1 mg/dL
Microalb Creat Ratio: 113.3 mg/g — ABNORMAL HIGH (ref 0.0–30.0)
Microalb, Ur: 268.6 mg/dL — ABNORMAL HIGH (ref 0.0–1.9)

## 2021-03-10 NOTE — Assessment & Plan Note (Signed)
Well-controlled.  He will continue amlodipine 2.5 mg once daily, carvedilol 3.125 mg twice daily, Entresto 1 tablet twice daily, Lasix 20 mg daily, and spironolactone 25 mg daily.

## 2021-03-10 NOTE — Patient Instructions (Signed)
Nice to see you. The wound care center should contact you.  If you do not hear from them in the next week please let us know. Please continue to cleanse the area with gentle soap and water.  If you develop signs of infection as we discussed you need to seek medical attention in the emergency room.  If you have bleeding that will not stop please seek medical attention as well. Somebody from ophthalmology and GI will contact you to set up appointments. We will contact you with your lab results.

## 2021-03-10 NOTE — Progress Notes (Signed)
Christopher Rumps, MD Phone: [phone]  Westyn [name] [name]. is a 76 y.o. male who presents today for f/u.  HYPERTENSION/CHF Disease Monitoring: Blood pressure range-similar to today Chest pain- no      Dyspnea- no Orthopnea-no  PND-no Medications: Compliance- taking amlodipine, coreg, entresto, spironolactone    Edema- no   DIABETES Disease Monitoring: Blood Sugar ranges-not checking polyuria/phagia/dipsia-no      Optho-due Medications: Compliance-taking metformin  hypoglycemic symptoms-no  HYPERLIPIDEMIA Disease Monitoring: See symptoms for Hypertension Medications: Compliance-taking Lipitor right upper quadrant pain- no  Muscle aches- no  Left leg wound: The patient notes 2 to 3 months ago he was sitting in front of his heater for a while and noted a blister came up.  Since then he has had a wound on his leg that is slow to heal.  He did not directly contact the heater.  He has had no fevers.  He does not feel ill.  He notes it is very slowly healing.  His daughter has been helping him clean it and changed his bandages.  They are cleaning it with soap and water and changing bandages every other day.  History of colon cancer: Patient is due to see GI.   Social History   Tobacco Use  Smoking Status Never  Smokeless Tobacco Never    Current Outpatient Medications on File Prior to Visit  Medication Sig Dispense Refill   amLODipine (NORVASC) 2.5 MG tablet Take by mouth.     aspirin EC 81 MG tablet Take 1 tablet (81 mg total) by mouth daily. 90 tablet 3   atorvastatin (LIPITOR) 40 MG tablet TAKE 1 TABLET BY MOUTH EVERY DAY 90 tablet 3   carvedilol (COREG) 3.125 MG tablet TAKE 1 TABLET(3.125 MG) BY MOUTH TWICE DAILY 180 tablet 3   cholecalciferol (VITAMIN D3) 25 MCG (1000 UNIT) tablet Take 1,000 Units by mouth daily.     docusate sodium (COLACE) 100 MG capsule Take 1 capsule (100 mg total) by mouth daily as needed for mild constipation. 30 capsule 5   ENTRESTO 24-26 MG TAKE 1  TABLET BY MOUTH TWICE DAILY 60 tablet 5   ferrous sulfate 325 (65 FE) MG EC tablet Take 1 tablet (325 mg total) by mouth 2 (two) times daily with a meal. 60 tablet 4   furosemide (LASIX) 20 MG tablet Take 1 tablet (20 mg total) by mouth daily. 30 tablet 6   gabapentin (NEURONTIN) 100 MG capsule Take 100 mg by mouth daily in the afternoon.     HYDROcodone-acetaminophen (NORCO/VICODIN) 5-325 MG tablet Take 1 tablet by mouth every 6 (six) hours as needed for moderate pain. 15 tablet 0   ibuprofen (ADVIL) 800 MG tablet Take 1 tablet (800 mg total) by mouth every 8 (eight) hours as needed. 30 tablet 0   metFORMIN (GLUCOPHAGE) 500 MG tablet TAKE 1 TABLET(500 MG) BY MOUTH TWICE DAILY WITH A MEAL 180 tablet 3   spironolactone (ALDACTONE) 25 MG tablet TAKE 1 TABLET(25 MG) BY MOUTH DAILY 30 tablet 0   tamsulosin (FLOMAX) 0.4 MG CAPS capsule TAKE 1 CAPSULE(0.4 MG) BY MOUTH DAILY AFTER SUPPER (Patient taking differently: Take 0.4 mg by mouth daily after breakfast.) 30 capsule 11   No current facility-administered medications on file prior to visit.     ROS see history of present illness  Objective  Physical Exam Vitals:   03/10/21 1040  BP: 120/80  Pulse: 72  Temp: (!) 97.5 F (36.4 C)  SpO2: 99%    BP Readings from  Last 3 Encounters:  03/10/21 120/80  11/29/20 124/72  11/01/20 138/75   Wt Readings from Last 3 Encounters:  03/10/21 174 lb 8 oz (79.2 kg)  11/29/20 165 lb 12.8 oz (75.2 kg)  11/01/20 159 lb (72.1 kg)    Physical Exam Constitutional:      General: He is not in acute distress.    Appearance: He is not diaphoretic.  Cardiovascular:     Rate and Rhythm: Normal rate and regular rhythm.     Heart sounds: Normal heart sounds.  Pulmonary:     Effort: Pulmonary effort is normal.     Breath sounds: Normal breath sounds.  Musculoskeletal:     Right lower leg: No edema.     Left lower leg: No edema.  Skin:    General: Skin is warm and dry.  Neurological:     Mental  Status: He is alert.    Posterior left leg, no surrounding erythema, no surrounding warmth, no fluctuance    Anterior left leg, no surrounding erythema, no surrounding warmth, no fluctuance  Assessment/Plan: Please see individual problem list.  Problem List Items Addressed This Visit     Chronic systolic heart failure ([place])    He appears to be euvolemic today.  His weight has trended up over a number of months. He will continue amlodipine 2.5 mg once daily, carvedilol 3.125 mg twice daily, Entresto 1 tablet twice daily, Lasix 20 mg daily, and spironolactone 25 mg daily.      Diabetes ([place])    Undetermined control.  We will check an A1c.  He will complete a urine microalbumin.  He will continue metformin 500 mg twice daily.      Relevant Orders   HgB A1c   Urine Microalbumin w/creat. ratio   Ambulatory referral to Ophthalmology   History of colon cancer    Will refer back to GI.      Relevant Orders   Ambulatory referral to Gastroenterology   Hyperlipidemia    He will continue Lipitor 40 mg once daily.  We will check a lipid panel.      Relevant Orders   Lipid panel   Hypertension - Primary    Well-controlled.  He will continue amlodipine 2.5 mg once daily, carvedilol 3.125 mg twice daily, Entresto 1 tablet twice daily, Lasix 20 mg daily, and spironolactone 25 mg daily.      Relevant Orders   Basic Metabolic Panel (BMET)   Leg wound, left, initial encounter    As outlined in the pictures above.  These are nonhealing.  There is no sign of infection.  The wounds were cleansed with sterile saline by me and redressed by the CMA.  Discussed the need to see wound care.  He was advised if he does not hear from them in the next week or so he needs to contact us.  Discussed reasons to seek medical attention regarding development of infection or bleeding.      Relevant Orders   AMB referral to wound care center    Return in about 6 months (around 09/07/2021) for  Hypertension/diabetes.  This visit occurred during the SARS-CoV-2 public health emergency.  Safety protocols were in place, including screening questions prior to the visit, additional usage of staff PPE, and extensive cleaning of exam room while observing appropriate contact time as indicated for disinfecting solutions.   I have spent 42 minutes in the care of this patient regarding history taking, documentation, evaluation of his wound with cleansing, completion of  exam, discussion of plan, placing orders.   Christopher Rumps, MD [place]

## 2021-03-10 NOTE — Assessment & Plan Note (Addendum)
As outlined in the pictures above.  These are nonhealing.  There is no sign of infection.  The wounds were cleansed with sterile saline by me and redressed by the CMA.  Discussed the need to see wound care.  He was advised if he does not hear from them in the next week or so he needs to contact us.  Discussed reasons to seek medical attention regarding development of infection or bleeding.

## 2021-03-10 NOTE — Assessment & Plan Note (Signed)
Will refer back to GI 

## 2021-03-10 NOTE — Assessment & Plan Note (Signed)
He will continue Lipitor 40 mg once daily.  We will check a lipid panel.

## 2021-03-10 NOTE — Assessment & Plan Note (Signed)
He appears to be euvolemic today.  His weight has trended up over a number of months. He will continue amlodipine 2.5 mg once daily, carvedilol 3.125 mg twice daily, Entresto 1 tablet twice daily, Lasix 20 mg daily, and spironolactone 25 mg daily.

## 2021-03-10 NOTE — Assessment & Plan Note (Signed)
Undetermined control.  We will check an A1c.  He will complete a urine microalbumin.  He will continue metformin 500 mg twice daily.

## 2021-03-17 DIAGNOSIS — B029 Zoster without complications: Secondary | ICD-10-CM | POA: Diagnosis not present

## 2021-03-17 DIAGNOSIS — I509 Heart failure, unspecified: Secondary | ICD-10-CM | POA: Diagnosis not present

## 2021-03-17 DIAGNOSIS — R0902 Hypoxemia: Secondary | ICD-10-CM | POA: Diagnosis not present

## 2021-03-17 DIAGNOSIS — I110 Hypertensive heart disease with heart failure: Secondary | ICD-10-CM | POA: Diagnosis not present

## 2021-03-27 ENCOUNTER — Encounter: Payer: PPO | Attending: Physician Assistant | Admitting: Physician Assistant

## 2021-03-27 ENCOUNTER — Other Ambulatory Visit: Payer: Self-pay

## 2021-03-27 DIAGNOSIS — L97822 Non-pressure chronic ulcer of other part of left lower leg with fat layer exposed: Secondary | ICD-10-CM | POA: Diagnosis not present

## 2021-03-27 DIAGNOSIS — I2510 Atherosclerotic heart disease of native coronary artery without angina pectoris: Secondary | ICD-10-CM | POA: Diagnosis not present

## 2021-03-27 DIAGNOSIS — I130 Hypertensive heart and chronic kidney disease with heart failure and stage 1 through stage 4 chronic kidney disease, or unspecified chronic kidney disease: Secondary | ICD-10-CM | POA: Insufficient documentation

## 2021-03-27 DIAGNOSIS — I872 Venous insufficiency (chronic) (peripheral): Secondary | ICD-10-CM | POA: Insufficient documentation

## 2021-03-27 DIAGNOSIS — I5042 Chronic combined systolic (congestive) and diastolic (congestive) heart failure: Secondary | ICD-10-CM | POA: Diagnosis not present

## 2021-03-27 DIAGNOSIS — E1122 Type 2 diabetes mellitus with diabetic chronic kidney disease: Secondary | ICD-10-CM | POA: Diagnosis not present

## 2021-03-27 DIAGNOSIS — E11622 Type 2 diabetes mellitus with other skin ulcer: Secondary | ICD-10-CM | POA: Insufficient documentation

## 2021-03-27 DIAGNOSIS — E119 Type 2 diabetes mellitus without complications: Secondary | ICD-10-CM | POA: Diagnosis not present

## 2021-03-27 DIAGNOSIS — N183 Chronic kidney disease, stage 3 unspecified: Secondary | ICD-10-CM | POA: Insufficient documentation

## 2021-03-27 DIAGNOSIS — L97222 Non-pressure chronic ulcer of left calf with fat layer exposed: Secondary | ICD-10-CM | POA: Diagnosis not present

## 2021-03-27 LAB — HM DIABETES EYE EXAM

## 2021-03-28 ENCOUNTER — Ambulatory Visit: Payer: PPO | Admitting: Physician Assistant

## 2021-03-28 DIAGNOSIS — E11622 Type 2 diabetes mellitus with other skin ulcer: Secondary | ICD-10-CM | POA: Diagnosis not present

## 2021-03-28 NOTE — Progress Notes (Signed)
Christopher, [name] ([id number]) Visit Report for 03/27/2021 Chief Complaint Document Details Patient Name: [name], [name] Date of Service: 03/27/2021 8:45 AM Medical Record Number: [id number] Patient Account Number: [id number] Date of Birth/Sex: July 16, 1945 (76 y.o. M) Treating RN: Carlene Coria Primary Care Provider: Tommi Rumps Other Clinician: Referring Provider: Tommi Rumps Treating Provider/Extender: Skipper Cliche in Treatment: 0 Information Obtained from: Patient Chief Complaint Left leg ulcer Electronic Signature(s) Signed: 03/27/2021 9:42:33 AM By: Worthy Keeler PA-C Entered By: Worthy Keeler on 03/27/2021 09:42:33 [name] ([id number]) -------------------------------------------------------------------------------- Debridement Details Patient Name: [name] Date of Service: 03/27/2021 8:45 AM Medical Record Number: [id number] Patient Account Number: [id number] Date of Birth/Sex: 1945-08-09 (76 y.o. M) Treating RN: Carlene Coria Primary Care Provider: Tommi Rumps Other Clinician: Referring Provider: Tommi Rumps Treating Provider/Extender: Skipper Cliche in Treatment: 0 Debridement Performed for Wound #1 Left,Anterior Lower Leg Assessment: Performed By: Physician Tommie Sams., PA-C Debridement Type: Chemical/Enzymatic/Mechanical Agent Used: Saline and gauze Severity of Tissue Pre Debridement: Fat layer exposed Level of Consciousness (Pre- Awake and Alert procedure): Pre-procedure Verification/Time Out Yes - 09:50 Taken: Start Time: 09:50 Instrument: Other : saline and gauze Bleeding: Minimum End Time: 09:55 Procedural Pain: 0 Post Procedural Pain: 0 Response to Treatment: Procedure was tolerated well Level of Consciousness (Post- Awake and Alert procedure): Post Debridement Measurements of Total Wound Length: (cm) 7 Width: (cm) 5 Depth: (cm) 0.1 Volume: (cm) 2.749 Character of Wound/Ulcer Post Debridement:  Improved Severity of Tissue Post Debridement: Fat layer exposed Post Procedure Diagnosis Same as Pre-procedure Electronic Signature(s) Signed: 03/27/2021 5:14:22 PM By: Worthy Keeler PA-C Signed: 03/28/2021 7:54:25 AM By: Carlene Coria RN Entered By: Carlene Coria on 03/27/2021 09:53:47 [name] ([id number]) -------------------------------------------------------------------------------- Debridement Details Patient Name: [name] Date of Service: 03/27/2021 8:45 AM Medical Record Number: [id number] Patient Account Number: [id number] Date of Birth/Sex: 02-12-1945 (76 y.o. M) Treating RN: Carlene Coria Primary Care Provider: Tommi Rumps Other Clinician: Referring Provider: Tommi Rumps Treating Provider/Extender: Skipper Cliche in Treatment: 0 Debridement Performed for Wound #2 Left,Posterior Lower Leg Assessment: Performed By: Physician Tommie Sams., PA-C Debridement Type: Chemical/Enzymatic/Mechanical Agent Used: Saline and gauze Severity of Tissue Pre Debridement: Fat layer exposed Level of Consciousness (Pre- Awake and Alert procedure): Pre-procedure Verification/Time Out Yes - 09:50 Taken: Start Time: 09:50 Instrument: Other : saline and gauze Bleeding: Minimum End Time: 09:55 Procedural Pain: 0 Post Procedural Pain: 0 Response to Treatment: Procedure was tolerated well Level of Consciousness (Post- Awake and Alert procedure): Post Debridement Measurements of Total Wound Length: (cm) 3.5 Width: (cm) 3 Depth: (cm) 0.1 Volume: (cm) 0.825 Character of Wound/Ulcer Post Debridement: Improved Severity of Tissue Post Debridement: Fat layer exposed Post Procedure Diagnosis Same as Pre-procedure Electronic Signature(s) Signed: 03/27/2021 5:14:22 PM By: Worthy Keeler PA-C Signed: 03/28/2021 7:54:25 AM By: Carlene Coria RN Entered By: Carlene Coria on 03/27/2021 09:54:21 [name]  ([id number]) -------------------------------------------------------------------------------- HPI Details Patient Name: [name] Date of Service: 03/27/2021 8:45 AM Medical Record Number: [id number] Patient Account Number: [id number] Date of Birth/Sex: 05-06-1945 (76 y.o. M) Treating RN: Carlene Coria Primary Care Provider: Tommi Rumps Other Clinician: Referring Provider: Tommi Rumps Treating Provider/Extender: Skipper Cliche in Treatment: 0 History of Present Illness HPI Description: 03/27/2021 upon evaluation today patient appears to be doing well with regard to his wound. This was actually an issue that he has had since November 2022. He does have evidence of leg swelling and even early signs of lymphedema. Subsequently  he tells me that his daughter has been putting some "green-cream" on this. I am not sure what that is but nonetheless he does seem to have a lot of healing compared to what he had previous. Fortunately there does not appear to be any signs of active infection locally nor systemically at this time which is great news. Honestly as dry as his leg is and even the wound bed I think that Xeroform gauze would probably be an awesome option for him to try to help with getting this area to heal appropriately. Patient does have a history of diabetes mellitus type 2, chronic venous insufficiency, hypertension, chronic kidney disease stage III, congestive heart failure, and coronary artery disease. Electronic Signature(s) Signed: 03/27/2021 9:54:11 AM By: Worthy Keeler PA-C Entered By: Worthy Keeler on 03/27/2021 09:54:10 [name] ([id number]) -------------------------------------------------------------------------------- Physical Exam Details Patient Name: [name] Date of Service: 03/27/2021 8:45 AM Medical Record Number: [id number] Patient Account Number: [id number] Date of Birth/Sex: 1945-03-27 (76 y.o. M) Treating RN: Carlene Coria Primary Care  Provider: Tommi Rumps Other Clinician: Referring Provider: Tommi Rumps Treating Provider/Extender: Skipper Cliche in Treatment: 0 Constitutional sitting or standing blood pressure is within target range for patient.. pulse regular and within target range for patient.Marland Kitchen respirations regular, non- labored and within target range for patient.Marland Kitchen temperature within target range for patient.. Well-nourished and well-hydrated in no acute distress. Eyes conjunctiva clear no eyelid edema noted. pupils equal round and reactive to light and accommodation. Ears, Nose, Mouth, and Throat no gross abnormality of ear auricles or external auditory canals. normal hearing noted during conversation. mucus membranes moist. Respiratory normal breathing without difficulty. Cardiovascular 2+ dorsalis pedis/posterior tibialis pulses. 1+ pitting edema of the bilateral lower extremities. Musculoskeletal normal gait and posture. no significant deformity or arthritic changes, no loss or range of motion, no clubbing. Psychiatric this patient is able to make decisions and demonstrates good insight into disease process. Alert and Oriented x 3. pleasant and cooperative. Notes Upon inspection patient appears to be doing well and notes any signs of infection and I think he has been making good progress in regard to his wound. With that being said he definitely needs some help with getting this closed that has been since November that he has been doing with this. I think we can definitely speed up this process at this point. He does have some edema as well as early signs of lymphedema changes although I believe he is at least stage II lymphedema already. He does not wear compression socks on a regular basis. Electronic Signature(s) Signed: 03/27/2021 9:54:55 AM By: Worthy Keeler PA-C Entered By: Worthy Keeler on 03/27/2021 09:54:54 [name]  ([id number]) -------------------------------------------------------------------------------- Physician Orders Details Patient Name: [name] Date of Service: 03/27/2021 8:45 AM Medical Record Number: [id number] Patient Account Number: [id number] Date of Birth/Sex: 19-Jun-1945 (76 y.o. M) Treating RN: Carlene Coria Primary Care Provider: Tommi Rumps Other Clinician: Referring Provider: Tommi Rumps Treating Provider/Extender: Skipper Cliche in Treatment: 0 Verbal / Phone Orders: No Diagnosis Coding ICD-10 Coding Code Description E11.622 Type 2 diabetes mellitus with other skin ulcer I87.2 Venous insufficiency (chronic) (peripheral) L97.822 Non-pressure chronic ulcer of other part of left lower leg with fat layer exposed I10 Essential (primary) hypertension N18.30 Chronic kidney disease, stage 3 unspecified I50.42 Chronic combined systolic (congestive) and diastolic (congestive) heart failure I25.10 Atherosclerotic heart disease of native coronary artery without angina pectoris Follow-up Appointments o Return Appointment in 1 week. Bathing/ Shower/  Hygiene o May shower; gently cleanse wound with antibacterial soap, rinse and pat dry prior to dressing wounds Edema Control - Lymphedema / Segmental Compressive Device / Other o Tubigrip single layer applied. - size D Wound Treatment Wound #1 - Lower Leg Wound Laterality: Left, Anterior Cleanser: [employer]ram Ancillary Kit - 15 Day Supply (DME) (Generic) 3 x Per Week/30 Days Discharge Instructions: Use supplies as instructed; Kit contains: (15) Saline Bullets; (15) 3x3 Gauze; 15 pr Gloves Primary Dressing: Xeroform 5x9-HBD (in/in) (DME) (Generic) 3 x Per Week/30 Days Discharge Instructions: Apply Xeroform 5x9-HBD (in/in) as directed Secondary Dressing: ABD Pad 5x9 (in/in) (DME) (Generic) 3 x Per Week/30 Days Discharge Instructions: Cover with ABD pad Secured With: 459M Medipore H Soft Cloth Surgical Tape, 2x2 (in/yd) (DME)  (Generic) 3 x Per Week/30 Days Wound #2 - Lower Leg Wound Laterality: Left, Posterior Cleanser: Byram Ancillary Kit - 15 Day Supply (DME) (Generic) 3 x Per Week/30 Days Discharge Instructions: Use supplies as instructed; Kit contains: (15) Saline Bullets; (15) 3x3 Gauze; 15 pr Gloves Primary Dressing: Xeroform 5x9-HBD (in/in) (DME) (Generic) 3 x Per Week/30 Days Discharge Instructions: Apply Xeroform 5x9-HBD (in/in) as directed Secondary Dressing: ABD Pad 5x9 (in/in) (DME) (Generic) 3 x Per Week/30 Days Discharge Instructions: Cover with ABD pad Secured With: 459M Medipore H Soft Cloth Surgical Tape, 2x2 (in/yd) (DME) (Generic) 3 x Per Week/30 Days Electronic Signature(s) Signed: 03/27/2021 5:14:22 PM By: Worthy Keeler PA-C Signed: 03/28/2021 7:54:25 AM By: Carlene Coria RN Entered By: Carlene Coria on 03/27/2021 09:55:17 OSAMU, OLGUIN ([id number]) GABRIEN, MENTINK ([id number]) -------------------------------------------------------------------------------- Problem List Details Patient Name: [name] Date of Service: 03/27/2021 8:45 AM Medical Record Number: [id number] Patient Account Number: [id number] Date of Birth/Sex: 10-10-1945 (76 y.o. M) Treating RN: Carlene Coria Primary Care Provider: Tommi Rumps Other Clinician: Referring Provider: Tommi Rumps Treating Provider/Extender: Skipper Cliche in Treatment: 0 Active Problems ICD-10 Encounter Code Description Active Date MDM Diagnosis E11.622 Type 2 diabetes mellitus with other skin ulcer 03/27/2021 No Yes I87.2 Venous insufficiency (chronic) (peripheral) 03/27/2021 No Yes L97.822 Non-pressure chronic ulcer of other part of left lower leg with fat layer 03/27/2021 No Yes exposed [place] (primary) hypertension 03/27/2021 No Yes N18.30 Chronic kidney disease, stage 3 unspecified 03/27/2021 No Yes I50.42 Chronic combined systolic (congestive) and diastolic (congestive) heart 03/27/2021 No Yes failure I25.10  Atherosclerotic heart disease of native coronary artery without angina 03/27/2021 No Yes pectoris Inactive Problems Resolved Problems Electronic Signature(s) Signed: 03/27/2021 9:44:36 AM By: Worthy Keeler PA-C Previous Signature: 03/27/2021 9:42:10 AM Version By: Worthy Keeler PA-C Entered By: Worthy Keeler on 03/27/2021 09:44:36 [name] ([id number]) -------------------------------------------------------------------------------- Progress Note Details Patient Name: [name] Date of Service: 03/27/2021 8:45 AM Medical Record Number: [id number] Patient Account Number: [id number] Date of Birth/Sex: 21-Jun-1945 (76 y.o. M) Treating RN: Carlene Coria Primary Care Provider: Tommi Rumps Other Clinician: Referring Provider: Tommi Rumps Treating Provider/Extender: Skipper Cliche in Treatment: 0 Subjective Chief Complaint Information obtained from Patient Left leg ulcer History of Present Illness (HPI) 03/27/2021 upon evaluation today patient appears to be doing well with regard to his wound. This was actually an issue that he has had since November 2022. He does have evidence of leg swelling and even early signs of lymphedema. Subsequently he tells me that his daughter has been putting some "green-cream" on this. I am not sure what that is but nonetheless he does seem to have a lot of healing compared to what he had previous. Fortunately  there does not appear to be any signs of active infection locally nor systemically at this time which is great news. Honestly as dry as his leg is and even the wound bed I think that Xeroform gauze would probably be an awesome option for him to try to help with getting this area to heal appropriately. Patient does have a history of diabetes mellitus type 2, chronic venous insufficiency, hypertension, chronic kidney disease stage III, congestive heart failure, and coronary artery disease. Patient History Allergies shellfish  derived Social History Never smoker, Marital Status - Married, Alcohol Use - Never, Drug Use - No History, Caffeine Use - Moderate. Medical History Cardiovascular Patient has history of Coronary Artery Disease, Hypertension Endocrine Patient has history of Type II Diabetes Patient is treated with Oral Agents. Review of Systems (ROS) Eyes Complains or has symptoms of Glasses / Contacts. Genitourinary CKD 3 Integumentary (Skin) Complains or has symptoms of Wounds. Objective Constitutional sitting or standing blood pressure is within target range for patient.. pulse regular and within target range for patient.Marland Kitchen respirations regular, non- labored and within target range for patient.Marland Kitchen temperature within target range for patient.. Well-nourished and well-hydrated in no acute distress. Vitals Time Taken: 8:59 AM, Height: 69 in, Source: Stated, Weight: 183 lbs, BMI: 27, Temperature: 97.6 F, Pulse: 55 bpm, Respiratory Rate: 18 breaths/min, Blood Pressure: 111/69 mmHg. Eyes conjunctiva clear no eyelid edema noted. pupils equal round and reactive to light and accommodation. Ears, Nose, Mouth, and Throat no gross abnormality of ear auricles or external auditory canals. normal hearing noted during conversation. mucus membranes moist. DISHON, KEHOE. ([id number]) Respiratory normal breathing without difficulty. Cardiovascular 2+ dorsalis pedis/posterior tibialis pulses. 1+ pitting edema of the bilateral lower extremities. Musculoskeletal normal gait and posture. no significant deformity or arthritic changes, no loss or range of motion, no clubbing. Psychiatric this patient is able to make decisions and demonstrates good insight into disease process. Alert and Oriented x 3. pleasant and cooperative. General Notes: Upon inspection patient appears to be doing well and notes any signs of infection and I think he has been making good progress in regard to his wound. With that being said he  definitely needs some help with getting this closed that has been since November that he has been doing with this. I think we can definitely speed up this process at this point. He does have some edema as well as early signs of lymphedema changes although I believe he is at least stage II lymphedema already. He does not wear compression socks on a regular basis. Integumentary (Hair, Skin) Wound #1 status is Open. Original cause of wound was Gradually Appeared. The date acquired was: 12/28/2020. The wound is located on the Left,Anterior Lower Leg. The wound measures 7cm length x 5cm width x 0.1cm depth; 27.489cm^2 area and 2.749cm^3 volume. There is Fat Layer (Subcutaneous Tissue) exposed. There is no tunneling or undermining noted. There is a medium amount of serosanguineous drainage noted. There is large (67-100%) red, pink granulation within the wound bed. There is a small (1-33%) amount of necrotic tissue within the wound bed including Adherent Slough. Wound #2 status is Open. Original cause of wound was Gradually Appeared. The date acquired was: 12/28/2020. The wound is located on the Left,Posterior Lower Leg. The wound measures 3.5cm length x 3cm width x 0.1cm depth; 8.247cm^2 area and 0.825cm^3 volume. There is Fat Layer (Subcutaneous Tissue) exposed. There is no tunneling or undermining noted. There is a medium amount of serosanguineous drainage noted. There is  large (67-100%) red, pink granulation within the wound bed. There is a small (1-33%) amount of necrotic tissue within the wound bed including Adherent Slough. Assessment Active Problems ICD-10 Type 2 diabetes mellitus with other skin ulcer Venous insufficiency (chronic) (peripheral) Non-pressure chronic ulcer of other part of left lower leg with fat layer exposed Essential (primary) hypertension Chronic kidney disease, stage 3 unspecified Chronic combined systolic (congestive) and diastolic (congestive) heart  failure Atherosclerotic heart disease of native coronary artery without angina pectoris Procedures Wound #1 Pre-procedure diagnosis of Wound #1 is a Diabetic Wound/Ulcer of the Lower Extremity located on the Left,Anterior Lower Leg .Severity of Tissue Pre Debridement is: Fat layer exposed. There was a Chemical/Enzymatic/Mechanical debridement performed by Tommie Sams., PA-C. With the following instrument(s): saline and gauze. Other agent used was Saline and gauze. A time out was conducted at 09:50, prior to the start of the procedure. A Minimum amount of bleeding was controlled with N/A. The procedure was tolerated well with a pain level of 0 throughout and a pain level of 0 following the procedure. Post Debridement Measurements: 7cm length x 5cm width x 0.1cm depth; 2.749cm^3 volume. Character of Wound/Ulcer Post Debridement is improved. Severity of Tissue Post Debridement is: Fat layer exposed. Post procedure Diagnosis Wound #1: Same as Pre-Procedure Wound #2 Pre-procedure diagnosis of Wound #2 is a Diabetic Wound/Ulcer of the Lower Extremity located on the Left,Posterior Lower Leg .Severity of Tissue Pre Debridement is: Fat layer exposed. There was a Chemical/Enzymatic/Mechanical debridement performed by Tommie Sams., PA-C. With the following instrument(s): saline and gauze. Other agent used was Saline and gauze. A time out was conducted at 09:50, prior to the start of the procedure. A Minimum amount of bleeding was controlled with N/A. The procedure was tolerated well with a pain level of 0 throughout and a pain level of 0 following the procedure. Post Debridement Measurements: 3.5cm length x 3cm width x 0.1cm depth; 0.825cm^3 volume. Character of Wound/Ulcer Post Debridement is improved. Severity of Tissue Post Debridement is: Fat layer exposed. Post procedure Diagnosis Wound #2: Same as Pre-Procedure IVAN, LACHER ([id number]) Plan Follow-up Appointments: Return Appointment in 1  week. Bathing/ Shower/ Hygiene: May shower; gently cleanse wound with antibacterial soap, rinse and pat dry prior to dressing wounds Edema Control - Lymphedema / Segmental Compressive Device / Other: Tubigrip single layer applied. - size D WOUND #1: - Lower Leg Wound Laterality: Left, Anterior Primary Dressing: Xeroform 5x9-HBD (in/in) 3 x Per Week/30 Days Discharge Instructions: Apply Xeroform 5x9-HBD (in/in) as directed Secondary Dressing: ABD Pad 5x9 (in/in) 3 x Per Week/30 Days Discharge Instructions: Cover with ABD pad Secured With: 59M Medipore H Soft Cloth Surgical Tape, 2x2 (in/yd) 3 x Per Week/30 Days WOUND #2: - Lower Leg Wound Laterality: Left, Posterior Primary Dressing: Xeroform 5x9-HBD (in/in) 3 x Per Week/30 Days Discharge Instructions: Apply Xeroform 5x9-HBD (in/in) as directed Secondary Dressing: ABD Pad 5x9 (in/in) 3 x Per Week/30 Days Discharge Instructions: Cover with ABD pad Secured With: 59M Medipore H Soft Cloth Surgical Tape, 2x2 (in/yd) 3 x Per Week/30 Days 1. Would recommend currently based on what I am seeing that we actually initiate treatment with a Xeroform gauze dressing which I think is probably can to be the best option here currently. Fortunately I do not see any signs of infection locally nor systemically which is great news. 2. I am also can recommend based on what I am seeing on that the patient continue to use an ABD pad to cover roll  gauze to secure in place. 3. I would also suggest that he continue to utilize Tubigrip to help keep the edema under control. Honestly I really feel like that the Tubigrip is something that can be beneficial in getting this to heal much more effectively and quickly. We will see patient back for reevaluation in 1 week here in the clinic. If anything worsens or changes patient will contact our office for additional recommendations. Electronic Signature(s) Signed: 03/27/2021 9:55:40 AM By: Worthy Keeler PA-C Entered By: Worthy Keeler on 03/27/2021 09:55:40 [name] ([id number]) -------------------------------------------------------------------------------- ROS/PFSH Details Patient Name: [name] Date of Service: 03/27/2021 8:45 AM Medical Record Number: [id number] Patient Account Number: [id number] Date of Birth/Sex: [birth year] (76 y.o. M) Treating RN: Carlene Coria Primary Care Provider: Tommi Rumps Other Clinician: Referring Provider: Tommi Rumps Treating Provider/Extender: Skipper Cliche in Treatment: 0 Eyes Complaints and Symptoms: Positive for: Glasses / Contacts Integumentary (Skin) Complaints and Symptoms: Positive for: Wounds Cardiovascular Medical History: Positive for: Coronary Artery Disease; Hypertension Endocrine Medical History: Positive for: Type II Diabetes Treated with: Oral agents Genitourinary Complaints and Symptoms: Review of System Notes: CKD 3 Immunizations Pneumococcal Vaccine: Received Pneumococcal Vaccination: No Implantable Devices None Family and Social History Never smoker; Marital Status - Married; Alcohol Use: Never; Drug Use: No History; Caffeine Use: Moderate; Financial Concerns: No; Food, Clothing or Shelter Needs: No; Support System Lacking: No; Transportation Concerns: No Electronic Signature(s) Signed: 03/27/2021 5:14:22 PM By: Worthy Keeler PA-C Signed: 03/28/2021 7:54:25 AM By: Carlene Coria RN Entered By: Carlene Coria on 03/27/2021 09:02:40 LUIGI, STUCKEY ([id number]) -------------------------------------------------------------------------------- SuperBill Details Patient Name: [name] Date of Service: 03/27/2021 Medical Record Number: [id number] Patient Account Number: [id number] Date of Birth/Sex: [birth year] (76 y.o. M) Treating RN: Carlene Coria Primary Care Provider: Tommi Rumps Other Clinician: Referring Provider: Tommi Rumps Treating Provider/Extender: Skipper Cliche in Treatment: 0 Diagnosis  Coding ICD-10 Codes Code Description E11.622 Type 2 diabetes mellitus with other skin ulcer I87.2 Venous insufficiency (chronic) (peripheral) L97.822 Non-pressure chronic ulcer of other part of left lower leg with fat layer exposed I10 Essential (primary) hypertension N18.30 Chronic kidney disease, stage 3 unspecified I50.42 Chronic combined systolic (congestive) and diastolic (congestive) heart failure I25.10 Atherosclerotic heart disease of native coronary artery without angina pectoris Facility Procedures CPT4 Code: [id number] Description: [phone] - WOUND CARE VISIT-LEV 5 EST PT Modifier: Quantity: 1 Physician Procedures CPT4 Code: [id number] Description: WC PHYS LEVEL 3 o NEW PT Modifier: Quantity: 1 CPT4 Code: Description: ICD-10 Diagnosis Description E11.622 Type 2 diabetes mellitus with other skin ulcer I87.2 Venous insufficiency (chronic) (peripheral) L97.822 Non-pressure chronic ulcer of other part of left lower leg with fat l I10 Essential (primary)  hypertension Modifier: ayer exposed Quantity: Electronic Signature(s) Signed: 03/27/2021 5:14:22 PM By: Worthy Keeler PA-C Signed: 03/28/2021 7:54:25 AM By: Carlene Coria RN Previous Signature: 03/27/2021 9:56:28 AM Version By: Worthy Keeler PA-C Previous Signature: 03/27/2021 9:56:05 AM Version By: Worthy Keeler PA-C Entered By: Carlene Coria on 03/27/2021 10:08:26

## 2021-03-28 NOTE — Progress Notes (Signed)
POSEY, PETRIK ([id number]) Visit Report for 03/27/2021 Abuse Risk Screen Details Patient Name: [name], [name] Date of Service: 03/27/2021 8:45 AM Medical Record Number: [id number] Patient Account Number: [id number] Date of Birth/Sex: 06-10-1945 (75 y.o. M) Treating RN: Carlene Coria Primary Care Chaniyah Jahr: Tommi Rumps Other Clinician: Referring Dimitrios Balestrieri: Tommi Rumps Treating Baker Moronta/Extender: Skipper Cliche in Treatment: 0 Abuse Risk Screen Items Answer ABUSE RISK SCREEN: Has anyone close to you tried to hurt or harm you recentlyo No Do you feel uncomfortable with anyone in your familyo No Has anyone forced you do things that you didnot want to doo No Electronic Signature(s) Signed: 03/28/2021 7:54:25 AM By: Carlene Coria RN Entered By: Carlene Coria on 03/27/2021 09:02:46 [name] ([id number]) -------------------------------------------------------------------------------- Activities of Daily Living Details Patient Name: [name] Date of Service: 03/27/2021 8:45 AM Medical Record Number: [id number] Patient Account Number: [id number] Date of Birth/Sex: 09/30/1945 (75 y.o. M) Treating RN: Carlene Coria Primary Care Inola Lisle: Tommi Rumps Other Clinician: Referring Analynn Daum: Tommi Rumps Treating Jeny Nield/Extender: Skipper Cliche in Treatment: 0 Activities of Daily Living Items Answer Activities of Daily Living (Please select one for each item) Drive Automobile Not Able Take Medications Need Assistance Use Telephone Completely Able Care for Appearance Completely Able Use Toilet Completely Able Bath / Shower Completely Able Dress Self Completely Able Feed Self Completely Able Walk Completely Able Get In / Out Bed Completely Able Housework Need Assistance Prepare Meals Need Assistance Handle Money Not Able Shop for Self Need Assistance Electronic Signature(s) Signed: 03/28/2021 7:54:25 AM By: Carlene Coria RN Entered By: Carlene Coria on  03/27/2021 09:03:25 [name] ([id number]) -------------------------------------------------------------------------------- Education Screening Details Patient Name: [name] Date of Service: 03/27/2021 8:45 AM Medical Record Number: [id number] Patient Account Number: [id number] Date of Birth/Sex: February 22, 1945 (75 y.o. M) Treating RN: Carlene Coria Primary Care Taleisha Kaczynski: Tommi Rumps Other Clinician: Referring Jamarius Saha: Tommi Rumps Treating Khrystyne Arpin/Extender: Skipper Cliche in Treatment: 0 Primary Learner Assessed: Patient Learning Preferences/Education Level/Primary Language Learning Preference: Explanation Highest Education Level: High School Preferred Language: English Cognitive Barrier Language Barrier: No Translator Needed: No Memory Deficit: No Emotional Barrier: No Cultural/Religious Beliefs Affecting Medical Care: No Physical Barrier Impaired Vision: Yes Glasses Impaired Hearing: No Decreased Hand dexterity: No Knowledge/Comprehension Knowledge Level: Medium Comprehension Level: Medium Ability to understand written instructions: Medium Ability to understand verbal instructions: Medium Motivation Anxiety Level: Anxious Cooperation: Cooperative Education Importance: Acknowledges Need Interest in Health Problems: Asks Questions Perception: Coherent Willingness to Engage in Self-Management Medium Activities: Readiness to Engage in Self-Management Medium Activities: Electronic Signature(s) Signed: 03/28/2021 7:54:25 AM By: Carlene Coria RN Entered By: Carlene Coria on 03/27/2021 09:03:57 [name] ([id number]) -------------------------------------------------------------------------------- Fall Risk Assessment Details Patient Name: [name] Date of Service: 03/27/2021 8:45 AM Medical Record Number: [id number] Patient Account Number: [id number] Date of Birth/Sex: 1945/08/28 (76 y.o. M) Treating RN: Carlene Coria Primary Care Chaze Hruska:  Tommi Rumps Other Clinician: Referring Alper Guilmette: Tommi Rumps Treating Hildy Nicholl/Extender: Skipper Cliche in Treatment: 0 Fall Risk Assessment Items Have you had 2 or more falls in the last 12 monthso 0 No Have you had any fall that resulted in injury in the last 12 monthso 0 No FALLS RISK SCREEN History of falling - immediate or within 3 months 0 No Secondary diagnosis (Do you have 2 or more medical diagnoseso) 0 No Ambulatory aid None/bed rest/wheelchair/nurse 0 No Crutches/cane/walker 0 No Furniture 0 No Intravenous therapy Access/Saline/Heparin Lock 0 No Gait/Transferring Normal/ bed rest/ wheelchair 0 No Weak (short steps with  or without shuffle, stooped but able to lift head while walking, may 0 No seek support from furniture) Impaired (short steps with shuffle, may have difficulty arising from chair, head down, impaired 0 No balance) Mental Status Oriented to own ability 0 No Electronic Signature(s) Signed: 03/28/2021 7:54:25 AM By: Carlene Coria RN Entered By: Carlene Coria on 03/27/2021 09:04:05 [name] ([id number]) -------------------------------------------------------------------------------- Foot Assessment Details Patient Name: [name] Date of Service: 03/27/2021 8:45 AM Medical Record Number: [id number] Patient Account Number: [id number] Date of Birth/Sex: [birth year] (75 y.o. M) Treating RN: Carlene Coria Primary Care Nazanin Kinner: Tommi Rumps Other Clinician: Referring Rithy Mandley: Tommi Rumps Treating Lc Joynt/Extender: Skipper Cliche in Treatment: 0 Foot Assessment Items Site Locations + = Sensation present, - = Sensation absent, C = Callus, U = Ulcer R = Redness, W = Warmth, M = Maceration, PU = Pre-ulcerative lesion F = Fissure, S = Swelling, D = Dryness Assessment Right: Left: Other Deformity: No No Prior Foot Ulcer: No No Prior Amputation: No No Charcot Joint: No No Ambulatory Status: Ambulatory Without  Help Gait: Electronic Signature(s) Signed: 03/28/2021 7:54:25 AM By: Carlene Coria RN Entered By: Carlene Coria on 03/27/2021 09:13:59 [name] ([id number]) -------------------------------------------------------------------------------- Nutrition Risk Screening Details Patient Name: [name] Date of Service: 03/27/2021 8:45 AM Medical Record Number: [id number] Patient Account Number: [id number] Date of Birth/Sex: 1945/05/27 (75 y.o. M) Treating RN: Carlene Coria Primary Care Alias Villagran: Tommi Rumps Other Clinician: Referring Kysha Muralles: Tommi Rumps Treating Misaki Sozio/Extender: Skipper Cliche in Treatment: 0 Height (in): 69 Weight (lbs): 183 Body Mass Index (BMI): 27 Nutrition Risk Screening Items Score Screening NUTRITION RISK SCREEN: I have an illness or condition that made me change the kind and/or amount of food I eat 0 No I eat fewer than two meals per day 0 No I eat few fruits and vegetables, or milk products 0 No I have three or more drinks of beer, liquor or wine almost every day 0 No I have tooth or mouth problems that make it hard for me to eat 0 No I don't always have enough money to buy the food I need 0 No I eat alone most of the time 0 No I take three or more different prescribed or over-the-counter drugs a day 1 Yes Without wanting to, I have lost or gained 10 pounds in the last six months 0 No I am not always physically able to shop, cook and/or feed myself 2 Yes Nutrition Protocols Good Risk Protocol Moderate Risk Protocol 0 Provide education on nutrition High Risk Proctocol Risk Level: Moderate Risk Score: 3 Electronic Signature(s) Signed: 03/28/2021 7:54:25 AM By: Carlene Coria RN Entered By: Carlene Coria on 03/27/2021 09:04:16

## 2021-03-28 NOTE — Progress Notes (Signed)
[name], [name] ([id number]) Visit Report for 03/27/2021 Allergy List Details Patient Name: Christopher, [name] Date of Service: 03/27/2021 8:45 AM Medical Record Number: [id number] Patient Account Number: [id number] Date of Birth/Sex: [birth year] (76 y.o. M) Treating RN: Carlene Coria Primary Care Orion Mole: Tommi Rumps Other Clinician: Referring Dannis Deroche: Tommi Rumps Treating Tifani Dack/Extender: Jeri Cos Weeks in Treatment: 0 Allergies Active Allergies shellfish derived Allergy Notes Electronic Signature(s) Signed: 03/28/2021 7:54:25 AM By: Carlene Coria RN Entered By: Carlene Coria on 03/27/2021 09:00:15 [name] ([id number]) -------------------------------------------------------------------------------- Arrival Information Details Patient Name: [name] Date of Service: 03/27/2021 8:45 AM Medical Record Number: [id number] Patient Account Number: [id number] Date of Birth/Sex: 1945/11/13 (76 y.o. M) Treating RN: Carlene Coria Primary Care Tarrell Debes: Tommi Rumps Other Clinician: Referring Winfred Redel: Tommi Rumps Treating Sharief Wainwright/Extender: Skipper Cliche in Treatment: 0 Visit Information Patient Arrived: Ambulatory Arrival Time: 08:57 Accompanied By: self Transfer Assistance: None Patient Identification Verified: Yes Secondary Verification Process Completed: Yes Patient Requires Transmission-Based Precautions: No Patient Has Alerts: No Electronic Signature(s) Signed: 03/28/2021 7:54:25 AM By: Carlene Coria RN Entered By: Carlene Coria on 03/27/2021 08:57:34 [name] ([id number]) -------------------------------------------------------------------------------- Clinic Level of Care Assessment Details Patient Name: [name] Date of Service: 03/27/2021 8:45 AM Medical Record Number: [id number] Patient Account Number: [id number] Date of Birth/Sex: September 25, 1945 (76 y.o. M) Treating RN: Carlene Coria Primary Care Takeira Yanes: Tommi Rumps Other  Clinician: Referring Meena Barrantes: Tommi Rumps Treating Zarya Lasseigne/Extender: Skipper Cliche in Treatment: 0 Clinic Level of Care Assessment Items TOOL 2 Quantity Score X - Use when only an EandM is performed on the INITIAL visit 1 0 ASSESSMENTS - Nursing Assessment / Reassessment X - General Physical Exam (combine w/ comprehensive assessment (listed just below) when performed on new 1 20 pt. evals) X- 1 25 Comprehensive Assessment (HX, ROS, Risk Assessments, Wounds Hx, etc.) ASSESSMENTS - Wound and Skin Assessment / Reassessment []  - Simple Wound Assessment / Reassessment - one wound 0 X- 2 5 Complex Wound Assessment / Reassessment - multiple wounds []  - 0 Dermatologic / Skin Assessment (not related to wound area) ASSESSMENTS - Ostomy and/or Continence Assessment and Care []  - Incontinence Assessment and Management 0 []  - 0 Ostomy Care Assessment and Management (repouching, etc.) PROCESS - Coordination of Care X - Simple Patient / Family Education for ongoing care 1 15 []  - 0 Complex (extensive) Patient / Family Education for ongoing care []  - 0 Staff obtains Programmer, systems, Records, Test Results / Process Orders []  - 0 Staff telephones HHA, Nursing Homes / Clarify orders / etc []  - 0 Routine Transfer to another Facility (non-emergent condition) []  - 0 Routine Hospital Admission (non-emergent condition) X- 1 15 New Admissions / Biomedical engineer / Ordering NPWT, Apligraf, etc. []  - 0 Emergency Hospital Admission (emergent condition) X- 1 10 Simple Discharge Coordination []  - 0 Complex (extensive) Discharge Coordination PROCESS - Special Needs []  - Pediatric / Minor Patient Management 0 []  - 0 Isolation Patient Management []  - 0 Hearing / Language / Visual special needs []  - 0 Assessment of Community assistance (transportation, D/C planning, etc.) []  - 0 Additional assistance / Altered mentation []  - 0 Support Surface(s) Assessment (bed, cushion, seat,  etc.) INTERVENTIONS - Wound Cleansing / Measurement X - Wound Imaging (photographs - any number of wounds) 1 5 []  - 0 Wound Tracing (instead of photographs) []  - 0 Simple Wound Measurement - one wound X- 2 5 Complex Wound Measurement - multiple wounds MANRAJ, YEO. ([id number]) []  - 0 Simple Wound Cleansing -  one wound X- 2 5 Complex Wound Cleansing - multiple wounds INTERVENTIONS - Wound Dressings X - Small Wound Dressing one or multiple wounds 1 10 X- 1 15 Medium Wound Dressing one or multiple wounds []  - 0 Large Wound Dressing one or multiple wounds []  - 0 Application of Medications - injection INTERVENTIONS - Miscellaneous []  - External ear exam 0 []  - 0 Specimen Collection (cultures, biopsies, blood, body fluids, etc.) []  - 0 Specimen(s) / Culture(s) sent or taken to Lab for analysis []  - 0 Patient Transfer (multiple staff / Harrel Lemon Lift / Similar devices) []  - 0 Simple Staple / Suture removal (25 or less) []  - 0 Complex Staple / Suture removal (26 or more) []  - 0 Hypo / Hyperglycemic Management (close monitor of Blood Glucose) X- 1 15 Ankle / Brachial Index (ABI) - do not check if billed separately Has the patient been seen at the hospital within the last three years: Yes Total Score: 160 Level Of Care: New/Established - Level 5 Electronic Signature(s) Signed: 03/28/2021 7:54:25 AM By: Carlene Coria RN Entered By: Carlene Coria on 03/27/2021 10:08:12 [name] ([id number]) -------------------------------------------------------------------------------- Encounter Discharge Information Details Patient Name: [name] Date of Service: 03/27/2021 8:45 AM Medical Record Number: [id number] Patient Account Number: [id number] Date of Birth/Sex: 13-Aug-1945 (76 y.o. M) Treating RN: Carlene Coria Primary Care Karmin Kasprzak: Tommi Rumps Other Clinician: Referring Jamesha Ellsworth: Tommi Rumps Treating Tayloranne Lekas/Extender: Skipper Cliche in Treatment: 0 Encounter  Discharge Information Items Post Procedure Vitals Discharge Condition: Stable Temperature (F): 97.6 Ambulatory Status: Ambulatory Pulse (bpm): 55 Discharge Destination: Home Respiratory Rate (breaths/min): 18 Transportation: Private Auto Blood Pressure (mmHg): 111/69 Accompanied By: self Schedule Follow-up Appointment: Yes Clinical Summary of Care: Patient Declined Electronic Signature(s) Signed: 03/28/2021 7:54:25 AM By: Carlene Coria RN Entered By: Carlene Coria on 03/27/2021 10:09:44 [name] ([id number]) -------------------------------------------------------------------------------- Lower Extremity Assessment Details Patient Name: [name] Date of Service: 03/27/2021 8:45 AM Medical Record Number: [id number] Patient Account Number: [id number] Date of Birth/Sex: 02-06-1945 (76 y.o. M) Treating RN: Carlene Coria Primary Care Taevyn Hausen: Tommi Rumps Other Clinician: Referring Giulio Bertino: Tommi Rumps Treating Darcus Edds/Extender: Skipper Cliche in Treatment: 0 Edema Assessment Assessed: [Left: No] [Right: No] Edema: [Left: Ye] [Right: s] Calf Left: Right: Point of Measurement: 35 cm From Medial Instep 32 cm Ankle Left: Right: Point of Measurement: 10 cm From Medial Instep 21 cm Knee To Floor Left: Right: From Medial Instep 47 cm Vascular Assessment Pulses: Dorsalis Pedis Palpable: [Left:Yes] Doppler Audible: [Left:Yes] Blood Pressure: Brachial: [Left:110] Ankle: [Left:Dorsalis Pedis: 90 0.82] Electronic Signature(s) Signed: 03/28/2021 7:54:25 AM By: Carlene Coria RN Entered By: Carlene Coria on 03/27/2021 09:14:50 [name] ([id number]) -------------------------------------------------------------------------------- Multi Wound Chart Details Patient Name: [name] Date of Service: 03/27/2021 8:45 AM Medical Record Number: [id number] Patient Account Number: [id number] Date of Birth/Sex: 11-02-1945 (76 y.o. M) Treating RN: Carlene Coria Primary Care Jevan Gaunt: Tommi Rumps Other Clinician: Referring Dejay Kronk: Tommi Rumps Treating [name] [name]/Extender: Skipper Cliche in Treatment: 0 Vital Signs Height(in): 37 Pulse(bpm): 72 Weight(lbs): 183 Blood Pressure(mmHg): 111/69 Body Mass Index(BMI): 27 Temperature(F): 97.6 Respiratory Rate(breaths/min): 18 Photos: [N/A:N/A] Wound Location: Left, Anterior Lower Leg Left, Posterior Lower Leg N/A Wounding Event: Gradually Appeared Gradually Appeared N/A Primary Etiology: Diabetic Wound/Ulcer of the Lower Diabetic Wound/Ulcer of the Lower N/A Extremity Extremity Comorbid History: Coronary Artery Disease, Coronary Artery Disease, N/A Hypertension, Type II Diabetes Hypertension, Type II Diabetes Date Acquired: 12/28/2020 12/28/2020 N/A Weeks of Treatment: 0 0 N/A Wound Status: Open Open  N/A Wound Recurrence: No No N/A Clustered Wound: Yes No N/A Measurements L x W x D (cm) 7x5x0.1 3.5x3x0.1 N/A Area (cm) : 27.489 8.247 N/A Volume (cm) : 2.749 0.825 N/A Classification: Grade 2 Grade 2 N/A Exudate Amount: Medium Medium N/A Exudate Type: Serosanguineous Serosanguineous N/A Exudate Color: red, brown red, brown N/A Granulation Amount: Large (67-100%) Large (67-100%) N/A Granulation Quality: Red, Pink Red, Pink N/A Necrotic Amount: Small (1-33%) Small (1-33%) N/A Exposed Structures: Fat Layer (Subcutaneous Tissue): Fat Layer (Subcutaneous Tissue): N/A Yes Yes Fascia: No Fascia: No Tendon: No Tendon: No Muscle: No Muscle: No Joint: No Joint: No Bone: No Bone: No Epithelialization: Medium (34-66%) None N/A Treatment Notes Electronic Signature(s) Signed: 03/28/2021 7:54:25 AM By: Carlene Coria RN Entered By: Carlene Coria on 03/27/2021 09:48:55 [name] ([id number]) -------------------------------------------------------------------------------- Multi-Disciplinary Care Plan Details Patient Name: [name] Date of Service: 03/27/2021 8:45  AM Medical Record Number: [id number] Patient Account Number: [id number] Date of Birth/Sex: 1945/10/24 (76 y.o. M) Treating RN: Carlene Coria Primary Care Mylah Baynes: Tommi Rumps Other Clinician: Referring Atlas Crossland: Tommi Rumps Treating Rogelio Winbush/Extender: Skipper Cliche in Treatment: 0 Active Inactive Wound/Skin Impairment Nursing Diagnoses: Knowledge deficit related to ulceration/compromised skin integrity Goals: Patient/caregiver will verbalize understanding of skin care regimen Date Initiated: 03/27/2021 Target Resolution Date: 04/24/2021 Goal Status: Active Ulcer/skin breakdown will have a volume reduction of 30% by week 4 Date Initiated: 03/27/2021 Target Resolution Date: 05/25/2021 Goal Status: Active Ulcer/skin breakdown will have a volume reduction of 50% by week 8 Date Initiated: 03/27/2021 Target Resolution Date: 06/24/2021 Goal Status: Active Ulcer/skin breakdown will have a volume reduction of 80% by week 12 Date Initiated: 03/27/2021 Target Resolution Date: 07/25/2021 Goal Status: Active Ulcer/skin breakdown will heal within 14 weeks Date Initiated: 03/27/2021 Target Resolution Date: 08/24/2021 Goal Status: Active Interventions: Assess patient/caregiver ability to obtain necessary supplies Assess patient/caregiver ability to perform ulcer/skin care regimen upon admission and as needed Assess ulceration(s) every visit Notes: Electronic Signature(s) Signed: 03/28/2021 7:54:25 AM By: Carlene Coria RN Entered By: Carlene Coria on 03/27/2021 09:48:33 [name] ([id number]) -------------------------------------------------------------------------------- Pain Assessment Details Patient Name: [name] Date of Service: 03/27/2021 8:45 AM Medical Record Number: [id number] Patient Account Number: [id number] Date of Birth/Sex: 1945/10/10 (76 y.o. M) Treating RN: Carlene Coria Primary Care Imogine Carvell: Tommi Rumps Other Clinician: Referring Trentin Knappenberger:  Tommi Rumps Treating Payam Gribble/Extender: Skipper Cliche in Treatment: 0 Active Problems Location of Pain Severity and Description of Pain Patient Has Paino Yes Site Locations With Dressing Change: Yes Duration of the Pain. Constant / Intermittento Intermittent How Long Does it Lasto Hours: Minutes: 10 Rate the pain. Current Pain Level: 3 Worst Pain Level: 8 Least Pain Level: 0 Tolerable Pain Level: 5 Character of Pain Describe the Pain: Burning Pain Management and Medication Current Pain Management: Medication: Yes Cold Application: No Rest: Yes Massage: No Activity: No T.E.N.S.: No Heat Application: No Leg drop or elevation: No Is the Current Pain Management Adequate: Inadequate How does your wound impact your activities of daily livingo Sleep: No Bathing: No Appetite: No Relationship With Others: No Bladder Continence: No Emotions: No Bowel Continence: No Work: No Toileting: No Drive: No Dressing: No Hobbies: No Electronic Signature(s) Signed: 03/28/2021 7:54:25 AM By: Carlene Coria RN Entered By: Carlene Coria on 03/27/2021 08:59:09 [name] ([id number]) -------------------------------------------------------------------------------- Patient/Caregiver Education Details Patient Name: [name] Date of Service: 03/27/2021 8:45 AM Medical Record Number: [id number] Patient Account Number: [id number] Date of Birth/Gender: 1946-01-16 (76 y.o. M) Treating RN: Carlene Coria Primary  Care Physician: Tommi Rumps Other Clinician: Referring Physician: Tommi Rumps Treating Physician/Extender: Skipper Cliche in Treatment: 0 Education Assessment Education Provided To: Patient Education Topics Provided Wound/Skin Impairment: Methods: Explain/Verbal Responses: State content correctly Electronic Signature(s) Signed: 03/28/2021 7:54:25 AM By: Carlene Coria RN Entered By: Carlene Coria on 03/27/2021 10:08:37 [name]  ([id number]) -------------------------------------------------------------------------------- Wound Assessment Details Patient Name: [name] Date of Service: 03/27/2021 8:45 AM Medical Record Number: [id number] Patient Account Number: [id number] Date of Birth/Sex: 1945-10-17 (76 y.o. M) Treating RN: Carlene Coria Primary Care Sharesa Kemp: Tommi Rumps Other Clinician: Referring Jelina Paulsen: Tommi Rumps Treating Arwilda Georgia/Extender: Skipper Cliche in Treatment: 0 Wound Status Wound Number: 1 Primary Etiology: Diabetic Wound/Ulcer of the Lower Extremity Wound Location: Left, Anterior Lower Leg Wound Status: Open Wounding Event: Gradually Appeared Comorbid Coronary Artery Disease, Hypertension, Type II History: Diabetes Date Acquired: 12/28/2020 Weeks Of Treatment: 0 Clustered Wound: No Photos Wound Measurements Length: (cm) 7 Width: (cm) 5 Depth: (cm) 0.1 Area: (cm) 27.489 Volume: (cm) 2.749 % Reduction in Area: % Reduction in Volume: Epithelialization: Medium (34-66%) Tunneling: No Undermining: No Wound Description Classification: Grade 2 Exudate Amount: Medium Exudate Type: Serosanguineous Exudate Color: red, brown Foul Odor After Cleansing: No Slough/Fibrino Yes Wound Bed Granulation Amount: Large (67-100%) Exposed Structure Granulation Quality: Red, Pink Fascia Exposed: No Necrotic Amount: Small (1-33%) Fat Layer (Subcutaneous Tissue) Exposed: Yes Necrotic Quality: Adherent Slough Tendon Exposed: No Muscle Exposed: No Joint Exposed: No Bone Exposed: No Electronic Signature(s) Signed: 03/28/2021 7:54:25 AM By: Carlene Coria RN Entered By: Carlene Coria on 03/27/2021 09:17:56 [name] ([id number]) -------------------------------------------------------------------------------- Wound Assessment Details Patient Name: [name] Date of Service: 03/27/2021 8:45 AM Medical Record Number: [id number] Patient Account Number: [id number] Date of  Birth/Sex: May 25, 1945 (76 y.o. M) Treating RN: Carlene Coria Primary Care Christia Domke: Tommi Rumps Other Clinician: Referring Anaiyah Anglemyer: Tommi Rumps Treating Philip Kotlyar/Extender: Skipper Cliche in Treatment: 0 Wound Status Wound Number: 2 Primary Etiology: Diabetic Wound/Ulcer of the Lower Extremity Wound Location: Left, Posterior Lower Leg Wound Status: Open Wounding Event: Gradually Appeared Comorbid Coronary Artery Disease, Hypertension, Type II History: Diabetes Date Acquired: 12/28/2020 Weeks Of Treatment: 0 Clustered Wound: No Photos Wound Measurements Length: (cm) 3.5 Width: (cm) 3 Depth: (cm) 0.1 Area: (cm) 8.247 Volume: (cm) 0.825 % Reduction in Area: % Reduction in Volume: Epithelialization: None Tunneling: No Undermining: No Wound Description Classification: Grade 2 Exudate Amount: Medium Exudate Type: Serosanguineous Exudate Color: red, brown Foul Odor After Cleansing: No Slough/Fibrino Yes Wound Bed Granulation Amount: Large (67-100%) Exposed Structure Granulation Quality: Red, Pink Fascia Exposed: No Necrotic Amount: Small (1-33%) Fat Layer (Subcutaneous Tissue) Exposed: Yes Necrotic Quality: Adherent Slough Tendon Exposed: No Muscle Exposed: No Joint Exposed: No Bone Exposed: No Treatment Notes Wound #2 (Lower Leg) Wound Laterality: Left, Posterior Cleanser [employer] Ancillary Kit - 15 Day Supply Discharge Instruction: Use supplies as instructed; Kit contains: (15) Saline Bullets; (15) 3x3 Gauze; 15 pr Gloves Peri-Wound Care ELIAV, MECHLING ([id number]) Topical Primary Dressing Xeroform 5x9-HBD (in/in) Discharge Instruction: Apply Xeroform 5x9-HBD (in/in) as directed Secondary Dressing ABD Pad 5x9 (in/in) Discharge Instruction: Cover with ABD pad Secured With 63M Medipore H Soft Cloth Surgical Tape, 2x2 (in/yd) Compression Wrap Compression Stockings Add-Ons Electronic Signature(s) Signed: 03/28/2021 7:54:25 AM By: Carlene Coria  RN Entered By: Carlene Coria on 03/27/2021 09:19:23 [name] ([id number]) -------------------------------------------------------------------------------- Vitals Details Patient Name: [name] Date of Service: 03/27/2021 8:45 AM Medical Record Number: [id number] Patient Account Number: [id number] Date of Birth/Sex: 1945/07/16 (76 y.o. M)  Treating RN: Carlene Coria Primary Care Anacristina Steffek: Tommi Rumps Other Clinician: Referring Loda Bialas: Tommi Rumps Treating Quaniyah Bugh/Extender: Skipper Cliche in Treatment: 0 Vital Signs Time Taken: 08:59 Temperature (F): 97.6 Height (in): 69 Pulse (bpm): 55 Source: Stated Respiratory Rate (breaths/min): 18 Weight (lbs): 183 Blood Pressure (mmHg): 111/69 Body Mass Index (BMI): 27 Reference Range: 80 - 120 mg / dl Electronic Signature(s) Signed: 03/28/2021 7:54:25 AM By: Carlene Coria RN Entered By: Carlene Coria on 03/27/2021 08:59:40

## 2021-03-31 ENCOUNTER — Other Ambulatory Visit: Payer: Self-pay | Admitting: Family Medicine

## 2021-04-03 ENCOUNTER — Other Ambulatory Visit: Payer: PPO

## 2021-04-03 ENCOUNTER — Other Ambulatory Visit: Payer: Self-pay

## 2021-04-03 ENCOUNTER — Encounter: Payer: PPO | Admitting: Physician Assistant

## 2021-04-03 DIAGNOSIS — N183 Chronic kidney disease, stage 3 unspecified: Secondary | ICD-10-CM | POA: Diagnosis not present

## 2021-04-03 DIAGNOSIS — I129 Hypertensive chronic kidney disease with stage 1 through stage 4 chronic kidney disease, or unspecified chronic kidney disease: Secondary | ICD-10-CM | POA: Diagnosis not present

## 2021-04-03 DIAGNOSIS — E11622 Type 2 diabetes mellitus with other skin ulcer: Secondary | ICD-10-CM | POA: Diagnosis not present

## 2021-04-03 DIAGNOSIS — L97222 Non-pressure chronic ulcer of left calf with fat layer exposed: Secondary | ICD-10-CM | POA: Diagnosis not present

## 2021-04-03 DIAGNOSIS — L97822 Non-pressure chronic ulcer of other part of left lower leg with fat layer exposed: Secondary | ICD-10-CM | POA: Diagnosis not present

## 2021-04-03 NOTE — Progress Notes (Addendum)
ZESHAN, SENA ([id number]) Visit Report for 04/03/2021 Chief Complaint Document Details Patient Name: [name], [name] Date of Service: 04/03/2021 2:30 PM Medical Record Number: [id number] Patient Account Number: [id number] Date of Birth/Sex: Aug 01, 1945 (75 y.o. M) Treating RN: Donnamarie Poag Primary Care Provider: Tommi Rumps Other Clinician: Referring Provider: Tommi Rumps Treating Provider/Extender: Skipper Cliche in Treatment: 1 Information Obtained from: Patient Chief Complaint Left leg ulcer Electronic Signature(s) Signed: 04/03/2021 2:44:06 PM By: Worthy Keeler PA-C Entered By: Worthy Keeler on 04/03/2021 14:44:05 [name] ([id number]) -------------------------------------------------------------------------------- HPI Details Patient Name: [name] Date of Service: 04/03/2021 2:30 PM Medical Record Number: [id number] Patient Account Number: [id number] Date of Birth/Sex: [birth year] (75 y.o. M) Treating RN: Donnamarie Poag Primary Care Provider: Tommi Rumps Other Clinician: Referring Provider: Tommi Rumps Treating Provider/Extender: Skipper Cliche in Treatment: 1 History of Present Illness HPI Description: 03/27/2021 upon evaluation today patient appears to be doing well with regard to his wound. This was actually an issue that he has had since November 2022. He does have evidence of leg swelling and even early signs of lymphedema. Subsequently he tells me that his daughter has been putting some "green-cream" on this. I am not sure what that is but nonetheless he does seem to have a lot of healing compared to what he had previous. Fortunately there does not appear to be any signs of active infection locally nor systemically at this time which is great news. Honestly as dry as his leg is and even the wound bed I think that Xeroform gauze would probably be an awesome option for him to try to help with getting this area to heal  appropriately. Patient does have a history of diabetes mellitus type 2, chronic venous insufficiency, hypertension, chronic kidney disease stage III, congestive heart failure, and coronary artery disease. 04/03/2021 upon evaluation today patient's legs actually appear to be doing great his left leg where the wounds are is actually measuring a bit smaller and the wounds themselves are definitely smaller. I am actually extremely pleased with where we stand today and I see no signs of active infection locally or systemically at this time which is great news. No fevers, chills, nausea, vomiting, or diarrhea. Electronic Signature(s) Signed: 04/03/2021 3:42:27 PM By: Worthy Keeler PA-C Entered By: Worthy Keeler on 04/03/2021 15:42:27 [name], [name] ([id number]) -------------------------------------------------------------------------------- Physical Exam Details Patient Name: [name] Date of Service: 04/03/2021 2:30 PM Medical Record Number: [id number] Patient Account Number: [id number] Date of Birth/Sex: 09/23/1945 (75 y.o. M) Treating RN: Donnamarie Poag Primary Care Provider: Tommi Rumps Other Clinician: Referring Provider: Tommi Rumps Treating Provider/Extender: Skipper Cliche in Treatment: 1 Constitutional Well-nourished and well-hydrated in no acute distress. Respiratory normal breathing without difficulty. Psychiatric this patient is able to make decisions and demonstrates good insight into disease process. Alert and Oriented x 3. pleasant and cooperative. Notes Upon inspection patient's wounds are actually showing signs of excellent improvement I am very pleased with where things stand and I think we are headed in the right direction here. Electronic Signature(s) Signed: 04/03/2021 4:22:32 PM By: Worthy Keeler PA-C Entered By: Worthy Keeler on 04/03/2021 16:22:32 [name], [name]  ([id number]) -------------------------------------------------------------------------------- Physician Orders Details Patient Name: [name] Date of Service: 04/03/2021 2:30 PM Medical Record Number: [id number] Patient Account Number: [id number] Date of Birth/Sex: [birth year] (75 y.o. M) Treating RN: Donnamarie Poag Primary Care Provider: Tommi Rumps Other Clinician: Referring Provider: Tommi Rumps Treating Provider/Extender: Skipper Cliche in Treatment:  1 Verbal / Phone Orders: No Diagnosis Coding ICD-10 Coding Code Description E11.622 Type 2 diabetes mellitus with other skin ulcer I87.2 Venous insufficiency (chronic) (peripheral) L97.822 Non-pressure chronic ulcer of other part of left lower leg with fat layer exposed I10 Essential (primary) hypertension N18.30 Chronic kidney disease, stage 3 unspecified I50.42 Chronic combined systolic (congestive) and diastolic (congestive) heart failure I25.10 Atherosclerotic heart disease of native coronary artery without angina pectoris Follow-up Appointments o Return Appointment in 1 week. Bathing/ Shower/ Hygiene o May shower; gently cleanse wound with antibacterial soap, rinse and pat dry prior to dressing wounds Anesthetic (Use 'Patient Medications' Section for Anesthetic Order Entry) o Lidocaine applied to wound bed Edema Control - Lymphedema / Segmental Compressive Device / Other o Tubigrip single layer applied. - size D o Elevate leg(s) parallel to the floor when sitting. o DO YOUR BEST to sleep in the bed at night. DO NOT sleep in your recliner. Long hours of sitting in a recliner leads to swelling of the legs and/or potential wounds on your backside. Wound Treatment Wound #1 - Lower Leg Wound Laterality: Left, Anterior Cleanser: Byram Ancillary Kit - 15 Day Supply (Generic) 3 x Per Week/30 Days Discharge Instructions: Use supplies as instructed; Kit contains: (15) Saline Bullets; (15) 3x3 Gauze; 15 pr  Gloves Primary Dressing: Xeroform 5x9-HBD (in/in) (Generic) 3 x Per Week/30 Days Discharge Instructions: Apply Xeroform 5x9-HBD (in/in) as directed Secondary Dressing: ABD Pad 5x9 (in/in) (Generic) 3 x Per Week/30 Days Discharge Instructions: Cover with ABD pad Secured With: 18M Medipore H Soft Cloth Surgical Tape, 2x2 (in/yd) (Generic) 3 x Per Week/30 Days Wound #2 - Lower Leg Wound Laterality: Left, Posterior Cleanser: Byram Ancillary Kit - 15 Day Supply (Generic) 3 x Per Week/30 Days Discharge Instructions: Use supplies as instructed; Kit contains: (15) Saline Bullets; (15) 3x3 Gauze; 15 pr Gloves Primary Dressing: Xeroform 5x9-HBD (in/in) (Generic) 3 x Per Week/30 Days Discharge Instructions: Apply Xeroform 5x9-HBD (in/in) as directed Secondary Dressing: ABD Pad 5x9 (in/in) (Generic) 3 x Per Week/30 Days Discharge Instructions: Cover with ABD pad Secured With: 18M Medipore H Soft Cloth Surgical Tape, 2x2 (in/yd) (Generic) 3 x Per Week/30 Days [name], [name] ([id number]) Electronic Signature(s) Signed: 04/03/2021 4:22:58 PM By: Donnamarie Poag Signed: 04/03/2021 5:31:39 PM By: Worthy Keeler PA-C Entered By: Donnamarie Poag on 04/03/2021 14:53:46 [name] ([id number]) -------------------------------------------------------------------------------- Problem List Details Patient Name: [name] Date of Service: 04/03/2021 2:30 PM Medical Record Number: [id number] Patient Account Number: [id number] Date of Birth/Sex: 04/04/1945 (75 y.o. M) Treating RN: Donnamarie Poag Primary Care Provider: Tommi Rumps Other Clinician: Referring Provider: Tommi Rumps Treating Provider/Extender: Skipper Cliche in Treatment: 1 Active Problems ICD-10 Encounter Code Description Active Date MDM Diagnosis E11.622 Type 2 diabetes mellitus with other skin ulcer 03/27/2021 No Yes I87.2 Venous insufficiency (chronic) (peripheral) 03/27/2021 No Yes L97.822 Non-pressure chronic ulcer of other part of  left lower leg with fat layer 03/27/2021 No Yes exposed [place] (primary) hypertension 03/27/2021 No Yes N18.30 Chronic kidney disease, stage 3 unspecified 03/27/2021 No Yes I50.42 Chronic combined systolic (congestive) and diastolic (congestive) heart 03/27/2021 No Yes failure I25.10 Atherosclerotic heart disease of native coronary artery without angina 03/27/2021 No Yes pectoris Inactive Problems Resolved Problems Electronic Signature(s) Signed: 04/03/2021 2:44:00 PM By: Worthy Keeler PA-C Entered By: Worthy Keeler on 04/03/2021 14:44:00 [name] ([id number]) -------------------------------------------------------------------------------- Progress Note Details Patient Name: [name] Date of Service: 04/03/2021 2:30 PM Medical Record Number: [id number] Patient Account Number: [id number] Date  of Birth/Sex: [birth year] (76 y.o. M) Treating RN: Donnamarie Poag Primary Care Provider: Tommi Rumps Other Clinician: Referring Provider: Tommi Rumps Treating Provider/Extender: Skipper Cliche in Treatment: 1 Subjective Chief Complaint Information obtained from Patient Left leg ulcer History of Present Illness (HPI) 03/27/2021 upon evaluation today patient appears to be doing well with regard to his wound. This was actually an issue that he has had since November 2022. He does have evidence of leg swelling and even early signs of lymphedema. Subsequently he tells me that his daughter has been putting some "green-cream" on this. I am not sure what that is but nonetheless he does seem to have a lot of healing compared to what he had previous. Fortunately there does not appear to be any signs of active infection locally nor systemically at this time which is great news. Honestly as dry as his leg is and even the wound bed I think that Xeroform gauze would probably be an awesome option for him to try to help with getting this area to heal appropriately. Patient does have a  history of diabetes mellitus type 2, chronic venous insufficiency, hypertension, chronic kidney disease stage III, congestive heart failure, and coronary artery disease. 04/03/2021 upon evaluation today patient's legs actually appear to be doing great his left leg where the wounds are is actually measuring a bit smaller and the wounds themselves are definitely smaller. I am actually extremely pleased with where we stand today and I see no signs of active infection locally or systemically at this time which is great news. No fevers, chills, nausea, vomiting, or diarrhea. Objective Constitutional Well-nourished and well-hydrated in no acute distress. Vitals Time Taken: 2:25 PM, Height: 69 in, Weight: 183 lbs, BMI: 27, Temperature: 97.5 F, Pulse: 66 bpm, Respiratory Rate: 18 breaths/min, Blood Pressure: 144/83 mmHg. Respiratory normal breathing without difficulty. Psychiatric this patient is able to make decisions and demonstrates good insight into disease process. Alert and Oriented x 3. pleasant and cooperative. General Notes: Upon inspection patient's wounds are actually showing signs of excellent improvement I am very pleased with where things stand and I think we are headed in the right direction here. Integumentary (Hair, Skin) Wound #1 status is Open. Original cause of wound was Gradually Appeared. The date acquired was: 12/28/2020. The wound has been in treatment 1 weeks. The wound is located on the Left,Anterior Lower Leg. The wound measures 6.7cm length x 4.5cm width x 0.1cm depth; 23.68cm^2 area and 2.368cm^3 volume. There is Fat Layer (Subcutaneous Tissue) exposed. There is no tunneling or undermining noted. There is a medium amount of serosanguineous drainage noted. There is large (67-100%) pink, pale, hyper - granulation within the wound bed. There is a small (1-33%) amount of necrotic tissue within the wound bed including Adherent Slough. Wound #2 status is Open. Original cause of  wound was Gradually Appeared. The date acquired was: 12/28/2020. The wound has been in treatment 1 weeks. The wound is located on the Left,Posterior Lower Leg. The wound measures 2.7cm length x 2.4cm width x 0.1cm depth; 5.089cm^2 area and 0.509cm^3 volume. There is Fat Layer (Subcutaneous Tissue) exposed. There is no tunneling or undermining noted. There is a medium amount of serosanguineous drainage noted. There is large (67-100%) red, pink, pale granulation within the wound bed. There is a small (1-33%) amount of necrotic tissue within the wound bed including Adherent Slough. [name], [name] ([id number]) Assessment Active Problems ICD-10 Type 2 diabetes mellitus with other skin ulcer Venous insufficiency (chronic) (peripheral) Non-pressure chronic ulcer  of other part of left lower leg with fat layer exposed Essential (primary) hypertension Chronic kidney disease, stage 3 unspecified Chronic combined systolic (congestive) and diastolic (congestive) heart failure Atherosclerotic heart disease of native coronary artery without angina pectoris Plan Follow-up Appointments: Return Appointment in 1 week. Bathing/ Shower/ Hygiene: May shower; gently cleanse wound with antibacterial soap, rinse and pat dry prior to dressing wounds Anesthetic (Use 'Patient Medications' Section for Anesthetic Order Entry): Lidocaine applied to wound bed Edema Control - Lymphedema / Segmental Compressive Device / Other: Tubigrip single layer applied. - size D Elevate leg(s) parallel to the floor when sitting. DO YOUR BEST to sleep in the bed at night. DO NOT sleep in your recliner. Long hours of sitting in a recliner leads to swelling of the legs and/or potential wounds on your backside. WOUND #1: - Lower Leg Wound Laterality: Left, Anterior Cleanser: Byram Ancillary Kit - 15 Day Supply (Generic) 3 x Per Week/30 Days Discharge Instructions: Use supplies as instructed; Kit contains: (15) Saline Bullets; (15) 3x3  Gauze; 15 pr Gloves Primary Dressing: Xeroform 5x9-HBD (in/in) (Generic) 3 x Per Week/30 Days Discharge Instructions: Apply Xeroform 5x9-HBD (in/in) as directed Secondary Dressing: ABD Pad 5x9 (in/in) (Generic) 3 x Per Week/30 Days Discharge Instructions: Cover with ABD pad Secured With: 48M Medipore H Soft Cloth Surgical Tape, 2x2 (in/yd) (Generic) 3 x Per Week/30 Days WOUND #2: - Lower Leg Wound Laterality: Left, Posterior Cleanser: Byram Ancillary Kit - 15 Day Supply (Generic) 3 x Per Week/30 Days Discharge Instructions: Use supplies as instructed; Kit contains: (15) Saline Bullets; (15) 3x3 Gauze; 15 pr Gloves Primary Dressing: Xeroform 5x9-HBD (in/in) (Generic) 3 x Per Week/30 Days Discharge Instructions: Apply Xeroform 5x9-HBD (in/in) as directed Secondary Dressing: ABD Pad 5x9 (in/in) (Generic) 3 x Per Week/30 Days Discharge Instructions: Cover with ABD pad Secured With: 48M Medipore H Soft Cloth Surgical Tape, 2x2 (in/yd) (Generic) 3 x Per Week/30 Days 1. Would recommend currently that we going continue with the wound care measures as before and the patient is in agreement with plan this includes a Xeroform gauze dressing which I think is doing an awesome job. 2. I am also can recommend that we have the patient continue with ABD pad to cover. 3. We will also continue with the Tubigrip which I think is doing a great job. We will see patient back for reevaluation in 1 week here in the clinic. If anything worsens or changes patient will contact our office for additional recommendations. Electronic Signature(s) Signed: 04/03/2021 4:31:13 PM By: Worthy Keeler PA-C Entered By: Worthy Keeler on 04/03/2021 16:31:13 [name] ([id number]) -------------------------------------------------------------------------------- SuperBill Details Patient Name: [name] Date of Service: 04/03/2021 Medical Record Number: [id number] Patient Account Number: [id number] Date of Birth/Sex:  1945-05-25 (75 y.o. M) Treating RN: Donnamarie Poag Primary Care Provider: Tommi Rumps Other Clinician: Referring Provider: Tommi Rumps Treating Provider/Extender: Skipper Cliche in Treatment: 1 Diagnosis Coding ICD-10 Codes Code Description [phone] Type 2 diabetes mellitus with other skin ulcer I87.2 Venous insufficiency (chronic) (peripheral) L97.822 Non-pressure chronic ulcer of other part of left lower leg with fat layer exposed I10 Essential (primary) hypertension N18.30 Chronic kidney disease, stage 3 unspecified I50.42 Chronic combined systolic (congestive) and diastolic (congestive) heart failure I25.10 Atherosclerotic heart disease of native coronary artery without angina pectoris Facility Procedures CPT4 Code: [id number] Description: 99213 - WOUND CARE VISIT-LEV 3 EST PT Modifier: Quantity: 1 Physician Procedures CPT4 Code: [id number] Description: 99214 - WC PHYS LEVEL 4 - EST PT Modifier:  Quantity: 1 CPT4 Code: Description: ICD-10 Diagnosis Description E11.622 Type 2 diabetes mellitus with other skin ulcer I87.2 Venous insufficiency (chronic) (peripheral) L97.822 Non-pressure chronic ulcer of other part of left lower leg with fat lay I10 Essential (primary)  hypertension Modifier: er exposed Quantity: Electronic Signature(s) Signed: 04/03/2021 4:32:42 PM By: Worthy Keeler PA-C Previous Signature: 04/03/2021 4:22:58 PM Version By: Donnamarie Poag Entered By: Worthy Keeler on 04/03/2021 16:32:41

## 2021-04-04 ENCOUNTER — Inpatient Hospital Stay: Payer: PPO | Attending: Oncology

## 2021-04-04 DIAGNOSIS — E538 Deficiency of other specified B group vitamins: Secondary | ICD-10-CM | POA: Insufficient documentation

## 2021-04-04 DIAGNOSIS — Z85038 Personal history of other malignant neoplasm of large intestine: Secondary | ICD-10-CM | POA: Diagnosis not present

## 2021-04-04 MED ORDER — CYANOCOBALAMIN 1000 MCG/ML IJ SOLN
1000.0000 ug | INTRAMUSCULAR | Status: DC
  Administered 2021-04-04: 1000 ug via INTRAMUSCULAR
  Filled 2021-04-04: qty 1

## 2021-04-04 NOTE — Progress Notes (Signed)
WINFREY, CHILLEMI ([id number]) Visit Report for 04/03/2021 Arrival Information Details Patient Name: [name], [name] Date of Service: 04/03/2021 2:30 PM Medical Record Number: [id number] Patient Account Number: [id number] Date of Birth/Sex: [birth year] (75 y.o. M) Treating RN: Levora Dredge Primary Care Kohner Orlick: Tommi Rumps Other Clinician: Referring Xavion Muscat: Tommi Rumps Treating Rance Smithson/Extender: Skipper Cliche in Treatment: 1 Visit Information History Since Last Visit Added or deleted any medications: No Patient Arrived: Ambulatory Any new allergies or adverse reactions: No Arrival Time: 14:26 Had a fall or experienced change in No Accompanied By: self activities of daily living that may affect Transfer Assistance: None risk of falls: Patient Requires Transmission-Based Precautions: No Hospitalized since last visit: No Patient Has Alerts: No Has Dressing in Place as Prescribed: Yes Pain Present Now: No Electronic Signature(s) Signed: 04/04/2021 4:37:03 PM By: Levora Dredge Entered By: Levora Dredge on 04/03/2021 14:26:54 [name] ([id number]) -------------------------------------------------------------------------------- Clinic Level of Care Assessment Details Patient Name: [name] Date of Service: 04/03/2021 2:30 PM Medical Record Number: [id number] Patient Account Number: [id number] Date of Birth/Sex: 07-16-1945 (75 y.o. M) Treating RN: Donnamarie Poag Primary Care Lin Glazier: Tommi Rumps Other Clinician: Referring Amed Datta: Tommi Rumps Treating Alani Lacivita/Extender: Skipper Cliche in Treatment: 1 Clinic Level of Care Assessment Items TOOL 4 Quantity Score _0  - Use when only an EandM is performed on FOLLOW-UP visit 0 ASSESSMENTS - Nursing Assessment / Reassessment _1  - Reassessment of Co-morbidities (includes updates in patient status) 0 _2  - 0 Reassessment of Adherence to Treatment Plan ASSESSMENTS - Wound and Skin Assessment /  Reassessment _3  - Simple Wound Assessment / Reassessment - one wound 0 X- 1 5 Complex Wound Assessment / Reassessment - multiple wounds _4  - 0 Dermatologic / Skin Assessment (not related to wound area) ASSESSMENTS - Focused Assessment _5  - Circumferential Edema Measurements - multi extremities 0 _6  - 0 Nutritional Assessment / Counseling / Intervention _7  - 0 Lower Extremity Assessment (monofilament, tuning fork, pulses) _8  - 0 Peripheral Arterial Disease Assessment (using hand held doppler) ASSESSMENTS - Ostomy and/or Continence Assessment and Care _9  - Incontinence Assessment and Management 0 _10  - 0 Ostomy Care Assessment and Management (repouching, etc.) PROCESS - Coordination of Care X - Simple Patient / Family Education for ongoing care 1 15 _11  - 0 Complex (extensive) Patient / Family Education for ongoing care _12  - 0 Staff obtains Programmer, systems, Records, Test Results / Process Orders _13  - 0 Staff telephones HHA, Nursing Homes / Clarify orders / etc _14  - 0 Routine Transfer to another Facility (non-emergent condition) _15  - 0 Routine Hospital Admission (non-emergent condition) _16  - 0 New Admissions / Biomedical engineer / Ordering NPWT, Apligraf, etc. _17  - 0 Emergency Hospital Admission (emergent condition) X- 1 10 Simple Discharge Coordination _18  - 0 Complex (extensive) Discharge Coordination PROCESS - Special Needs _19  - Pediatric / Minor Patient Management 0 _20  - 0 Isolation Patient Management _21  - 0 Hearing / Language / Visual special needs _22  - 0 Assessment of Community assistance (transportation, D/C planning, etc.) _23  - 0 Additional assistance / Altered mentation _24  - 0 Support Surface(s) Assessment (bed, cushion, seat, etc.) INTERVENTIONS - Wound Cleansing / Measurement DAMIER, DISANO ([id number]) _25  - 0 Simple Wound Cleansing - one wound X- 2 5 Complex Wound Cleansing - multiple wounds X- 1 5 Wound Imaging (photographs - any number of wounds) _26  -  0 Wound Tracing (instead of photographs) _27  - 0 Simple Wound Measurement - one wound X- 2 5 Complex Wound Measurement - multiple wounds INTERVENTIONS -  Wound Dressings X - Small Wound Dressing one or multiple wounds 2 10 _0  - 0 Medium Wound Dressing one or multiple wounds _1  - 0 Large Wound Dressing one or multiple wounds X- 1 5 Application of Medications - topical <[id number]>_5<\/[id number]>_7  - 0 Application of Medications - injection INTERVENTIONS - Miscellaneous _3  - External ear exam 0 _4  - 0 Specimen Collection (cultures, biopsies, blood, body fluids, etc.) _5  - 0 Specimen(s) / Culture(s) sent or taken to Lab for analysis _6  - 0 Patient Transfer (multiple staff / Harrel Lemon Lift / Similar devices) _7  - 0 Simple Staple / Suture removal (25 or less) _8  - 0 Complex Staple / Suture removal (26 or more) _9  - 0 Hypo / Hyperglycemic Management (close monitor of Blood Glucose) _10  - 0 Ankle / Brachial Index (ABI) - do not check if billed separately X- 1 5 Vital Signs Has the patient been seen at the hospital within the last three years: Yes Total Score: 85 Level Of Care: New/Established - Level 3 Electronic Signature(s) Signed: 04/03/2021 4:22:58 PM By: Donnamarie Poag Entered By: Donnamarie Poag on 04/03/2021 14:54:21 [name] ([id number]) -------------------------------------------------------------------------------- Encounter Discharge Information Details Patient Name: [name] Date of Service: 04/03/2021 2:30 PM Medical Record Number: [id number] Patient Account Number: [id number] Date of Birth/Sex: April 20, 1945 (75 y.o. M) Treating RN: Donnamarie Poag Primary Care Yehia Mcbain: Tommi Rumps Other Clinician: Referring Ngoc Daughtridge: Tommi Rumps Treating Masud Holub/Extender: Skipper Cliche in Treatment: 1 Encounter Discharge Information Items Discharge Condition: Stable Ambulatory Status: Ambulatory Discharge Destination: Home Transportation: Private Auto Accompanied By: self Schedule  Follow-up Appointment: Yes Clinical Summary of Care: Electronic Signature(s) Signed: 04/03/2021 4:22:58 PM By: Donnamarie Poag Entered By: Donnamarie Poag on 04/03/2021 14:59:06 [name] ([id number]) -------------------------------------------------------------------------------- Lower Extremity Assessment Details Patient Name: [name] Date of Service: 04/03/2021 2:30 PM Medical Record Number: [id number] Patient Account Number: [id number] Date of Birth/Sex: 1945/08/14 (75 y.o. M) Treating RN: Levora Dredge Primary Care Durelle Zepeda: Tommi Rumps Other Clinician: Referring Talyn Dessert: Tommi Rumps Treating Maleeah Crossman/Extender: Jeri Cos Weeks in Treatment: 1 Edema Assessment Assessed: [Left: No] [Right: No] Edema: [Left: Ye] [Right: s] Calf Left: Right: Point of Measurement: 35 cm From Medial Instep 32.5 cm Ankle Left: Right: Point of Measurement: 10 cm From Medial Instep 22.5 cm Vascular Assessment Pulses: Dorsalis Pedis Palpable: [Left:Yes] Electronic Signature(s) Signed: 04/04/2021 4:37:03 PM By: Levora Dredge Entered By: Levora Dredge on 04/03/2021 14:40:07 [name] ([id number]) -------------------------------------------------------------------------------- Multi Wound Chart Details Patient Name: [name] Date of Service: 04/03/2021 2:30 PM Medical Record Number: [id number] Patient Account Number: [id number] Date of Birth/Sex: Dec 15, 1945 (75 y.o. M) Treating RN: Levora Dredge Primary Care Jontae Adebayo: Tommi Rumps Other Clinician: Referring Cyncere Ruhe: Tommi Rumps Treating Daneesha Quinteros/Extender: Skipper Cliche in Treatment: 1 Vital Signs Height(in): 83 Pulse(bpm): 37 Weight(lbs): 183 Blood Pressure(mmHg): 144/83 Body Mass Index(BMI): 27 Temperature(F): 97.5 Respiratory Rate(breaths/min): 18 Photos: [N/A:N/A] Wound Location: Left, Anterior Lower Leg Left, Posterior Lower Leg N/A Wounding Event: Gradually Appeared Gradually Appeared  N/A Primary Etiology: Diabetic Wound/Ulcer of the Lower Diabetic Wound/Ulcer of the Lower N/A Extremity Extremity Comorbid History: Coronary Artery Disease, Coronary Artery Disease, N/A Hypertension, Type II Diabetes Hypertension, Type II Diabetes Date Acquired: 12/28/2020 12/28/2020 N/A Weeks of Treatment: 1 1 N/A Wound Status: Open Open N/A Wound Recurrence: No No N/A Clustered Wound: Yes No N/A Measurements L x W x D (cm) 6.7x4.5x0.1 2.7x2.4x0.1 N/A Area (cm) : 23.68 5.089 N/A Volume (cm) : 2.368 0.509 N/A % Reduction in Area: 13.90% 38.30% N/A % Reduction in Volume:  13.90% 38.30% N/A Classification: Grade 2 Grade 2 N/A Exudate Amount: Medium Medium N/A Exudate Type: Serosanguineous Serosanguineous N/A Exudate Color: red, brown red, brown N/A Granulation Amount: Large (67-100%) Large (67-100%) N/A Granulation Quality: Pink, Pale, Hyper-granulation Red, Pink, Pale N/A Necrotic Amount: Small (1-33%) Small (1-33%) N/A Exposed Structures: Fat Layer (Subcutaneous Tissue): Fat Layer (Subcutaneous Tissue): N/A Yes Yes Fascia: No Fascia: No Tendon: No Tendon: No Muscle: No Muscle: No Joint: No Joint: No Bone: No Bone: No Epithelialization: Medium (34-66%) Small (1-33%) N/A Treatment Notes Electronic Signature(s) Signed: 04/04/2021 4:37:03 PM By: Levora Dredge Entered By: Levora Dredge on 04/03/2021 14:48:03 [name] ([id number]) -------------------------------------------------------------------------------- Bellamy Details Patient Name: [name] Date of Service: 04/03/2021 2:30 PM Medical Record Number: [id number] Patient Account Number: [id number] Date of Birth/Sex: [birth year] (75 y.o. M) Treating RN: Levora Dredge Primary Care Ariday Brinker: Tommi Rumps Other Clinician: Referring Kylene Zamarron: Tommi Rumps Treating Hilda Rynders/Extender: Skipper Cliche in Treatment: 1 Active Inactive Wound/Skin Impairment Nursing  Diagnoses: Knowledge deficit related to ulceration/compromised skin integrity Goals: Patient/caregiver will verbalize understanding of skin care regimen Date Initiated: 03/27/2021 Target Resolution Date: 04/24/2021 Goal Status: Active Ulcer/skin breakdown will have a volume reduction of 30% by week 4 Date Initiated: 03/27/2021 Target Resolution Date: 05/25/2021 Goal Status: Active Ulcer/skin breakdown will have a volume reduction of 50% by week 8 Date Initiated: 03/27/2021 Target Resolution Date: 06/24/2021 Goal Status: Active Ulcer/skin breakdown will have a volume reduction of 80% by week 12 Date Initiated: 03/27/2021 Target Resolution Date: 07/25/2021 Goal Status: Active Ulcer/skin breakdown will heal within 14 weeks Date Initiated: 03/27/2021 Target Resolution Date: 08/24/2021 Goal Status: Active Interventions: Assess patient/caregiver ability to obtain necessary supplies Assess patient/caregiver ability to perform ulcer/skin care regimen upon admission and as needed Assess ulceration(s) every visit Notes: Electronic Signature(s) Signed: 04/04/2021 4:37:03 PM By: Levora Dredge Entered By: Levora Dredge on 04/03/2021 14:47:44 [name] ([id number]) -------------------------------------------------------------------------------- Pain Assessment Details Patient Name: [name] Date of Service: 04/03/2021 2:30 PM Medical Record Number: [id number] Patient Account Number: [id number] Date of Birth/Sex: September 26, 1945 (75 y.o. M) Treating RN: Levora Dredge Primary Care Nikolus Marczak: Tommi Rumps Other Clinician: Referring Vivia Rosenburg: Tommi Rumps Treating Kaesyn Johnston/Extender: Skipper Cliche in Treatment: 1 Active Problems Location of Pain Severity and Description of Pain Patient Has Paino No Site Locations Rate the pain. Current Pain Level: 0 Pain Management and Medication Current Pain Management: Electronic Signature(s) Signed: 04/04/2021 4:37:03 PM By: Levora Dredge Entered By: Levora Dredge on 04/03/2021 14:28:50 [name] ([id number]) -------------------------------------------------------------------------------- Patient/Caregiver Education Details Patient Name: [name] Date of Service: 04/03/2021 2:30 PM Medical Record Number: [id number] Patient Account Number: [id number] Date of Birth/Gender: 1945/02/14 (75 y.o. M) Treating RN: Donnamarie Poag Primary Care Physician: Tommi Rumps Other Clinician: Referring Physician: Tommi Rumps Treating Physician/Extender: Skipper Cliche in Treatment: 1 Education Assessment Education Provided To: Patient Education Topics Provided Basic Hygiene: Nutrition: Venous: Wound/Skin Impairment: Electronic Signature(s) Signed: 04/03/2021 4:22:58 PM By: Donnamarie Poag Entered By: Donnamarie Poag on 04/03/2021 14:54:52 [name] ([id number]) -------------------------------------------------------------------------------- Wound Assessment Details Patient Name: [name] Date of Service: 04/03/2021 2:30 PM Medical Record Number: [id number] Patient Account Number: [id number] Date of Birth/Sex: Jan 17, 1946 (75 y.o. M) Treating RN: Levora Dredge Primary Care Essynce Munsch: Tommi Rumps Other Clinician: Referring Shallyn Constancio: Tommi Rumps Treating Cadie Sorci/Extender: Skipper Cliche in Treatment: 1 Wound Status Wound Number: 1 Primary Etiology: Diabetic Wound/Ulcer of the Lower Extremity Wound Location: Left, Anterior Lower Leg Wound Status: Open Wounding Event: Gradually Appeared Comorbid Coronary Artery  Disease, Hypertension, Type II History: Diabetes Date Acquired: 12/28/2020 Weeks Of Treatment: 1 Clustered Wound: Yes Photos Wound Measurements Length: (cm) 6.7 Width: (cm) 4.5 Depth: (cm) 0.1 Area: (cm) 23.68 Volume: (cm) 2.368 % Reduction in Area: 13.9% % Reduction in Volume: 13.9% Epithelialization: Medium (34-66%) Tunneling: No Undermining: No Wound  Description Classification: Grade 2 Exudate Amount: Medium Exudate Type: Serosanguineous Exudate Color: red, brown Foul Odor After Cleansing: No Slough/Fibrino Yes Wound Bed Granulation Amount: Large (67-100%) Exposed Structure Granulation Quality: Pink, Pale, Hyper-granulation Fascia Exposed: No Necrotic Amount: Small (1-33%) Fat Layer (Subcutaneous Tissue) Exposed: Yes Necrotic Quality: Adherent Slough Tendon Exposed: No Muscle Exposed: No Joint Exposed: No Bone Exposed: No Treatment Notes Wound #1 (Lower Leg) Wound Laterality: Left, Anterior Cleanser Byram Ancillary Kit - 15 Day Supply Discharge Instruction: Use supplies as instructed; Kit contains: (15) Saline Bullets; (15) 3x3 Gauze; 15 pr Gloves Peri-Wound Care DEMONTEZ, NOVACK ([id number]) Topical Primary Dressing Xeroform 5x9-HBD (in/in) Discharge Instruction: Apply Xeroform 5x9-HBD (in/in) as directed Secondary Dressing ABD Pad 5x9 (in/in) Discharge Instruction: Cover with ABD pad Secured With 71M Medipore H Soft Cloth Surgical Tape, 2x2 (in/yd) Compression Wrap Compression Stockings Add-Ons Electronic Signature(s) Signed: 04/04/2021 4:37:03 PM By: Levora Dredge Entered By: Levora Dredge on 04/03/2021 14:37:49 [name] ([id number]) -------------------------------------------------------------------------------- Wound Assessment Details Patient Name: [name] Date of Service: 04/03/2021 2:30 PM Medical Record Number: [id number] Patient Account Number: [id number] Date of Birth/Sex: November 09, 1945 (76 y.o. M) Treating RN: Levora Dredge Primary Care Josiyah Tozzi: Tommi Rumps Other Clinician: Referring Aadhira Heffernan: Tommi Rumps Treating Jacee Enerson/Extender: Skipper Cliche in Treatment: 1 Wound Status Wound Number: 2 Primary Etiology: Diabetic Wound/Ulcer of the Lower Extremity Wound Location: Left, Posterior Lower Leg Wound Status: Open Wounding Event: Gradually Appeared Comorbid Coronary  Artery Disease, Hypertension, Type II History: Diabetes Date Acquired: 12/28/2020 Weeks Of Treatment: 1 Clustered Wound: No Photos Wound Measurements Length: (cm) 2.7 Width: (cm) 2.4 Depth: (cm) 0.1 Area: (cm) 5.089 Volume: (cm) 0.509 % Reduction in Area: 38.3% % Reduction in Volume: 38.3% Epithelialization: Small (1-33%) Tunneling: No Undermining: No Wound Description Classification: Grade 2 Exudate Amount: Medium Exudate Type: Serosanguineous Exudate Color: red, brown Foul Odor After Cleansing: No Slough/Fibrino Yes Wound Bed Granulation Amount: Large (67-100%) Exposed Structure Granulation Quality: Red, Pink, Pale Fascia Exposed: No Necrotic Amount: Small (1-33%) Fat Layer (Subcutaneous Tissue) Exposed: Yes Necrotic Quality: Adherent Slough Tendon Exposed: No Muscle Exposed: No Joint Exposed: No Bone Exposed: No Treatment Notes Wound #2 (Lower Leg) Wound Laterality: Left, Posterior Cleanser Byram Ancillary Kit - 15 Day Supply Discharge Instruction: Use supplies as instructed; Kit contains: (15) Saline Bullets; (15) 3x3 Gauze; 15 pr Gloves Peri-Wound Care TAYLIN, LEDER ([id number]) Topical Primary Dressing Xeroform 5x9-HBD (in/in) Discharge Instruction: Apply Xeroform 5x9-HBD (in/in) as directed Secondary Dressing ABD Pad 5x9 (in/in) Discharge Instruction: Cover with ABD pad Secured With 71M Medipore H Soft Cloth Surgical Tape, 2x2 (in/yd) Compression Wrap Compression Stockings Add-Ons Electronic Signature(s) Signed: 04/04/2021 4:37:03 PM By: Levora Dredge Entered By: Levora Dredge on 04/03/2021 14:38:42 [name] ([id number]) -------------------------------------------------------------------------------- Vitals Details Patient Name: [name] Date of Service: 04/03/2021 2:30 PM Medical Record Number: [id number] Patient Account Number: [id number] Date of Birth/Sex: [birth year] (76 y.o. M) Treating RN: Levora Dredge Primary Care  Adam Demary: Tommi Rumps Other Clinician: Referring Rodnesha Elie: Tommi Rumps Treating Sanskriti Greenlaw/Extender: Skipper Cliche in Treatment: 1 Vital Signs Time Taken: 14:25 Temperature (F): 97.5 Height (in): 69 Pulse (bpm): 66 Weight (lbs): 183 Respiratory Rate (breaths/min): 18 Body Mass Index (BMI): 27 Blood  Pressure (mmHg): 144/83 Reference Range: 80 - 120 mg / dl Electronic Signature(s) Signed: 04/04/2021 4:37:03 PM By: Levora Dredge Entered By: Levora Dredge on 04/03/2021 14:28:30

## 2021-04-05 ENCOUNTER — Ambulatory Visit: Payer: PPO | Admitting: Oncology

## 2021-04-05 ENCOUNTER — Ambulatory Visit: Payer: PPO

## 2021-04-10 ENCOUNTER — Other Ambulatory Visit: Payer: Self-pay

## 2021-04-10 ENCOUNTER — Encounter: Payer: PPO | Attending: Physician Assistant | Admitting: Physician Assistant

## 2021-04-10 DIAGNOSIS — I2510 Atherosclerotic heart disease of native coronary artery without angina pectoris: Secondary | ICD-10-CM | POA: Diagnosis not present

## 2021-04-10 DIAGNOSIS — E1122 Type 2 diabetes mellitus with diabetic chronic kidney disease: Secondary | ICD-10-CM | POA: Insufficient documentation

## 2021-04-10 DIAGNOSIS — E11622 Type 2 diabetes mellitus with other skin ulcer: Secondary | ICD-10-CM | POA: Diagnosis not present

## 2021-04-10 DIAGNOSIS — L97222 Non-pressure chronic ulcer of left calf with fat layer exposed: Secondary | ICD-10-CM | POA: Diagnosis not present

## 2021-04-10 DIAGNOSIS — I5042 Chronic combined systolic (congestive) and diastolic (congestive) heart failure: Secondary | ICD-10-CM | POA: Insufficient documentation

## 2021-04-10 DIAGNOSIS — L97822 Non-pressure chronic ulcer of other part of left lower leg with fat layer exposed: Secondary | ICD-10-CM | POA: Insufficient documentation

## 2021-04-10 DIAGNOSIS — N183 Chronic kidney disease, stage 3 unspecified: Secondary | ICD-10-CM | POA: Insufficient documentation

## 2021-04-10 DIAGNOSIS — I130 Hypertensive heart and chronic kidney disease with heart failure and stage 1 through stage 4 chronic kidney disease, or unspecified chronic kidney disease: Secondary | ICD-10-CM | POA: Diagnosis not present

## 2021-04-10 DIAGNOSIS — I872 Venous insufficiency (chronic) (peripheral): Secondary | ICD-10-CM | POA: Diagnosis not present

## 2021-04-10 NOTE — Progress Notes (Addendum)
TRIMAINE, MASER ([id number]) Visit Report for 04/10/2021 Arrival Information Details Patient Name: [name], [name] Date of Service: 04/10/2021 2:45 PM Medical Record Number: [id number] Patient Account Number: [id number] Date of Birth/Sex: 12/16/1945 (75 y.o. M) Treating RN: Carlene Coria Primary Care Joslynne Klatt: Tommi Rumps Other Clinician: Referring Marchetta Navratil: Tommi Rumps Treating Kendle Turbin/Extender: Skipper Cliche in Treatment: 2 Visit Information History Since Last Visit All ordered tests and consults were completed: No Patient Arrived: Ambulatory Added or deleted any medications: No Arrival Time: 15:01 Any new allergies or adverse reactions: No Accompanied By: self Had a fall or experienced change in No Transfer Assistance: None activities of daily living that may affect Patient Identification Verified: Yes risk of falls: Secondary Verification Process Completed: Yes Signs or symptoms of abuse/neglect since last visito No Patient Requires Transmission-Based Precautions: No Hospitalized since last visit: No Patient Has Alerts: No Implantable device outside of the clinic excluding No cellular tissue based products placed in the center since last visit: Has Dressing in Place as Prescribed: Yes Has Compression in Place as Prescribed: Yes Pain Present Now: No Electronic Signature(s) Signed: 04/11/2021 8:30:35 AM By: Carlene Coria RN Entered By: Carlene Coria on 04/10/2021 15:05:42 [name] ([id number]) -------------------------------------------------------------------------------- Clinic Level of Care Assessment Details Patient Name: [name] Date of Service: 04/10/2021 2:45 PM Medical Record Number: [id number] Patient Account Number: [id number] Date of Birth/Sex: 03/02/1945 (75 y.o. M) Treating RN: Carlene Coria Primary Care Madlyn Crosby: Tommi Rumps Other Clinician: Referring Cayla Wiegand: Tommi Rumps Treating Stanley Helmuth/Extender: Skipper Cliche in  Treatment: 2 Clinic Level of Care Assessment Items TOOL 4 Quantity Score X - Use when only an EandM is performed on FOLLOW-UP visit 1 0 ASSESSMENTS - Nursing Assessment / Reassessment X - Reassessment of Co-morbidities (includes updates in patient status) 1 10 X- 1 5 Reassessment of Adherence to Treatment Plan ASSESSMENTS - Wound and Skin Assessment / Reassessment _0  - Simple Wound Assessment / Reassessment - one wound 0 X- 2 5 Complex Wound Assessment / Reassessment - multiple wounds _1  - 0 Dermatologic / Skin Assessment (not related to wound area) ASSESSMENTS - Focused Assessment _2  - Circumferential Edema Measurements - multi extremities 0 _3  - 0 Nutritional Assessment / Counseling / Intervention _4  - 0 Lower Extremity Assessment (monofilament, tuning fork, pulses) _5  - 0 Peripheral Arterial Disease Assessment (using hand held doppler) ASSESSMENTS - Ostomy and/or Continence Assessment and Care _6  - Incontinence Assessment and Management 0 _7  - 0 Ostomy Care Assessment and Management (repouching, etc.) PROCESS - Coordination of Care X - Simple Patient / Family Education for ongoing care 1 15 _8  - 0 Complex (extensive) Patient / Family Education for ongoing care _9  - 0 Staff obtains Programmer, systems, Records, Test Results / Process Orders _10  - 0 Staff telephones HHA, Nursing Homes / Clarify orders / etc _11  - 0 Routine Transfer to another Facility (non-emergent condition) _12  - 0 Routine Hospital Admission (non-emergent condition) _13  - 0 New Admissions / Biomedical engineer / Ordering NPWT, Apligraf, etc. _14  - 0 Emergency Hospital Admission (emergent condition) X- 1 10 Simple Discharge Coordination _15  - 0 Complex (extensive) Discharge Coordination PROCESS - Special Needs _16  - Pediatric / Minor Patient Management 0 _17  - 0 Isolation Patient Management _18  - 0 Hearing / Language / Visual special needs _19  - 0 Assessment of Community assistance (transportation, D/C  planning, etc.) _20  - 0 Additional assistance / Altered mentation _21  - 0 Support Surface(s) Assessment (bed, cushion, seat, etc.) INTERVENTIONS - Wound Cleansing / Measurement MOHAB, ASHBY. ([id number]) _22  -  0 Simple Wound Cleansing - one wound X- 2 5 Complex Wound Cleansing - multiple wounds X- 1 5 Wound Imaging (photographs - any number of wounds) _0  - 0 Wound Tracing (instead of photographs) _1  - 0 Simple Wound Measurement - one wound X- 2 5 Complex Wound Measurement - multiple wounds INTERVENTIONS - Wound Dressings X - Small Wound Dressing one or multiple wounds 2 10 _2  - 0 Medium Wound Dressing one or multiple wounds _3  - 0 Large Wound Dressing one or multiple wounds <VWUJWJXBJYNWGNFA>_2<\/ZHYQMVHQIONGEXBM>_8  - 0 Application of Medications - topical <UXLKGMWNUUVOZDGU>_4<\/QIHKVQQVZDGLOVFI>_4  - 0 Application of Medications - injection INTERVENTIONS - Miscellaneous _6  - External ear exam 0 _7  - 0 Specimen Collection (cultures, biopsies, blood, body fluids, etc.) _8  - 0 Specimen(s) / Culture(s) sent or taken to Lab for analysis _9  - 0 Patient Transfer (multiple staff / Civil Service fast streamer / Similar devices) _10  - 0 Simple Staple / Suture removal (25 or less) _11  - 0 Complex Staple / Suture removal (26 or more) _12  - 0 Hypo / Hyperglycemic Management (close monitor of Blood Glucose) _13  - 0 Ankle / Brachial Index (ABI) - do not check if billed separately X- 1 5 Vital Signs Has the patient been seen at the hospital within the last three years: Yes Total Score: 100 Level Of Care: New/Established - Level 3 Electronic Signature(s) Signed: 04/11/2021 8:30:35 AM By: Carlene Coria RN Entered By: Carlene Coria on 04/10/2021 15:15:49 [name] ([id number]) -------------------------------------------------------------------------------- Encounter Discharge Information Details Patient Name: [name] Date of Service: 04/10/2021 2:45 PM Medical Record Number: [id number] Patient Account Number: [id number] Date of Birth/Sex: Feb 27, 1945 (76 y.o. M) Treating  RN: Carlene Coria Primary Care Lucian Baswell: Tommi Rumps Other Clinician: Referring Feliza Diven: Tommi Rumps Treating Amyrah Pinkhasov/Extender: Skipper Cliche in Treatment: 2 Encounter Discharge Information Items Discharge Condition: Stable Ambulatory Status: Ambulatory Discharge Destination: Home Transportation: Private Auto Accompanied By: self Schedule Follow-up Appointment: Yes Clinical Summary of Care: Patient Declined Electronic Signature(s) Signed: 04/11/2021 8:30:35 AM By: Carlene Coria RN Entered By: Carlene Coria on 04/10/2021 15:16:46 [name] ([id number]) -------------------------------------------------------------------------------- Lower Extremity Assessment Details Patient Name: [name] Date of Service: 04/10/2021 2:45 PM Medical Record Number: [id number] Patient Account Number: [id number] Date of Birth/Sex: Jan 14, 1946 (75 y.o. M) Treating RN: Carlene Coria Primary Care Anayelli Lai: Tommi Rumps Other Clinician: Referring Jeiry Birnbaum: Tommi Rumps Treating Austan Nicholl/Extender: Skipper Cliche in Treatment: 2 Edema Assessment Assessed: [Left: No] [Right: No] Edema: [Left: Ye] [Right: s] Calf Left: Right: Point of Measurement: 35 cm From Medial Instep 32 cm Ankle Left: Right: Point of Measurement: 10 cm From Medial Instep 2 cm Vascular Assessment Pulses: Dorsalis Pedis Palpable: [Left:Yes] Electronic Signature(s) Signed: 04/11/2021 8:30:35 AM By: Carlene Coria RN Entered By: Carlene Coria on 04/10/2021 15:12:45 [name] ([id number]) -------------------------------------------------------------------------------- Multi Wound Chart Details Patient Name: [name] Date of Service: 04/10/2021 2:45 PM Medical Record Number: [id number] Patient Account Number: [id number] Date of Birth/Sex: 1945-10-01 (76 y.o. 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 N/A Extremity Extremity Comorbid History: Coronary Artery Disease, Coronary Artery Disease, N/A Hypertension, Type II Diabetes Hypertension, Type II  Diabetes Date Acquired: 12/28/2020 12/28/2020 N/A Weeks of Treatment: 2 2 N/A Wound Status: Open Open N/A Wound Recurrence: No No N/A Clustered Wound: Yes No N/A Measurements L x W x D (cm) 5x2x0.1 2.7x2.5x0.1 N/A Area (cm) : 7.854 5.301 N/A Volume (cm) : 0.785 0.53 N/A % Reduction in Area: 71.40% 35.70% N/A % Reduction in Volume: 71.40% 35.80% N/A Classification: Grade 2 Grade 2 N/A Exudate Amount: Medium Medium N/A Exudate Type: Serosanguineous Serosanguineous N/A Exudate Color: red, brown red, brown N/A Granulation Amount: Large (67-100%) Large (67-100%) N/A Granulation Quality: Pink, Pale, Hyper-granulation Red, Pink, Pale N/A Necrotic Amount: Small (1-33%) Small (1-33%) N/A Exposed Structures: Fat Layer (Subcutaneous Tissue): Fat Layer (Subcutaneous Tissue): N/A Yes Yes Fascia: No Fascia: No Tendon: No Tendon: No Muscle: No Muscle: No Joint: No Joint: No Bone: No Bone: No Epithelialization: Medium (34-66%) Small (1-33%) N/A Treatment Notes Electronic Signature(s) Signed: 04/11/2021 8:30:35 AM By: Carlene Coria RN Entered By: Carlene Coria on 04/10/2021 15:14:34 [name] ([id number]) -------------------------------------------------------------------------------- Multi-Disciplinary Care Plan Details Patient Name: [name] Date of Service: 04/10/2021 2:45  PM Medical Record Number: [id number] Patient Account Number: [id number] Date of Birth/Sex: 05/25/1945 (75 y.o. M) Treating RN: Carlene Coria Primary Care Kiara Mcdowell: Tommi Rumps Other Clinician: Referring Edy Belt: Tommi Rumps Treating Tanesia Butner/Extender: Skipper Cliche in Treatment: 2 Active Inactive Wound/Skin Impairment Nursing Diagnoses: Knowledge deficit related to ulceration/compromised skin integrity Goals: Patient/caregiver will verbalize understanding of skin care regimen Date Initiated: 03/27/2021 Target Resolution Date: 04/24/2021 Goal Status: Active Ulcer/skin breakdown will have a volume reduction of 30% by week 4 Date Initiated: 03/27/2021 Target Resolution Date: 05/25/2021 Goal Status: Active Ulcer/skin breakdown will have a volume reduction of 50% by week 8 Date Initiated: 03/27/2021 Target Resolution Date: 06/24/2021 Goal Status: Active Ulcer/skin breakdown will have a volume reduction of 80% by week 12 Date Initiated: 03/27/2021 Target Resolution Date: 07/25/2021 Goal Status: Active Ulcer/skin breakdown will heal within 14 weeks Date Initiated: 03/27/2021 Target Resolution Date: 08/24/2021 Goal Status: Active Interventions: Assess patient/caregiver ability to obtain necessary supplies Assess patient/caregiver ability to perform ulcer/skin care regimen upon admission and as needed Assess ulceration(s) every visit Notes: Electronic Signature(s) Signed: 04/11/2021 8:30:35 AM By: Carlene Coria RN Entered By: Carlene Coria on 04/10/2021 15:14:21 [name] ([id number]) -------------------------------------------------------------------------------- Pain Assessment Details Patient Name: [name] Date of Service: 04/10/2021 2:45 PM Medical Record Number: [id number] Patient Account Number: [id number] Date of Birth/Sex: 05/22/1945 (75 y.o. M) Treating RN: Carlene Coria Primary Care Cheryl Chay: Tommi Rumps Other Clinician: Referring Oaklyn Jakubek: Tommi Rumps Treating Valor Turber[place]/Extender: Skipper Cliche in Treatment: 2 Active Problems Location of Pain Severity and Description of Pain Patient Has Paino No Site Locations Pain Management and Medication Current Pain Management: Electronic Signature(s) Signed: 04/11/2021 8:30:35 AM By: Carlene Coria RN Entered By: Carlene Coria on 04/10/2021 15:06:14 [name] ([id number]) -------------------------------------------------------------------------------- Patient/Caregiver Education Details Patient Name: [name] Date of Service: 04/10/2021 2:45 PM Medical Record Number: [id number] Patient Account Number: [id number] Date of Birth/Gender: 1945/03/03 (75 y.o. M) Treating RN: Carlene Coria Primary Care Physician: Tommi Rumps Other Clinician: Referring Physician: Tommi Rumps Treating Physician/Extender: Skipper Cliche in Treatment: 2 Education Assessment Education Provided To: Patient Education Topics Provided Wound/Skin Impairment: Methods: Explain/Verbal Responses: State content correctly Electronic Signature(s) Signed: 04/11/2021 8:30:35 AM By: Carlene Coria RN Entered By: Carlene Coria on 04/10/2021 15:16:05 [name] ([id number]) -------------------------------------------------------------------------------- Wound Assessment Details Patient Name: [name] Date of Service: 04/10/2021 2:45 PM Medical Record Number: [id number] Patient Account Number: [id number]  Date of Birth/Sex: 03-Apr-1945 (76 y.o. M) Treating RN: Carlene Coria Primary Care Kriya Westra: Tommi Rumps Other Clinician: Referring Dionis Autry: Tommi Rumps Treating Obelia Bonello/Extender: Skipper Cliche in Treatment: 2 Wound Status Wound Number: 1 Primary Etiology: Diabetic Wound/Ulcer of the Lower Extremity Wound Location: Left, Anterior Lower Leg Wound Status: Open Wounding Event: Gradually Appeared Comorbid Coronary Artery Disease, Hypertension, Type II History: Diabetes Date Acquired:  12/28/2020 Weeks Of Treatment: 2 Clustered Wound: Yes Photos Wound Measurements Length: (cm) 5 Width: (cm) 2 Depth: (cm) 0.1 Area: (cm) 7.854 Volume: (cm) 0.785 % Reduction in Area: 71.4% % Reduction in Volume: 71.4% Epithelialization: Medium (34-66%) Tunneling: No Undermining: No Wound Description Classification: Grade 2 Exudate Amount: Medium Exudate Type: Serosanguineous Exudate Color: red, brown Foul Odor After Cleansing: No Slough/Fibrino Yes Wound Bed Granulation Amount: Large (67-100%) Exposed Structure Granulation Quality: Pink, Pale, Hyper-granulation Fascia Exposed: No Necrotic Amount: Small (1-33%) Fat Layer (Subcutaneous Tissue) Exposed: Yes Necrotic Quality: Adherent Slough Tendon Exposed: No Muscle Exposed: No Joint Exposed: No Bone Exposed: No Treatment Notes Wound #1 (Lower Leg) Wound Laterality: Left, Anterior Cleanser Byram Ancillary Kit - 15 Day Supply Discharge Instruction: Use supplies as instructed; Kit contains: (15) Saline Bullets; (15) 3x3 Gauze; 15 pr Gloves Peri-Wound Care KAMAREON, SCIANDRA ([id number]) Topical Primary Dressing Xeroform 5x9-HBD (in/in) Discharge Instruction: Apply Xeroform 5x9-HBD (in/in) as directed Secondary Dressing ABD Pad 5x9 (in/in) Discharge Instruction: Cover with ABD pad Secured With Medipore Tape - 42M Medipore H Soft Cloth Surgical Tape, 2x2 (in/yd) Compression Wrap Compression Stockings Add-Ons Electronic Signature(s) Signed: 04/11/2021 8:30:35 AM By: Carlene Coria RN Entered By: Carlene Coria on 04/10/2021 15:12:04 [name] ([id number]) -------------------------------------------------------------------------------- Wound Assessment Details Patient Name: [name] Date of Service: 04/10/2021 2:45 PM Medical Record Number: [id number] Patient Account Number: [id number] Date of Birth/Sex: 1945-12-11 (76 y.o. M) Treating RN: Carlene Coria Primary Care Treyshaun Keatts: Tommi Rumps Other  Clinician: Referring Adrie Picking: Tommi Rumps Treating Auset Fritzler/Extender: Skipper Cliche in Treatment: 2 Wound Status Wound Number: 2 Primary Etiology: Diabetic Wound/Ulcer of the Lower Extremity Wound Location: Left, Posterior Lower Leg Wound Status: Open Wounding Event: Gradually Appeared Comorbid Coronary Artery Disease, Hypertension, Type II History: Diabetes Date Acquired: 12/28/2020 Weeks Of Treatment: 2 Clustered Wound: No Photos Wound Measurements Length: (cm) 2.7 Width: (cm) 2.5 Depth: (cm) 0.1 Area: (cm) 5.301 Volume: (cm) 0.53 % Reduction in Area: 35.7% % Reduction in Volume: 35.8% Epithelialization: Small (1-33%) Tunneling: No Undermining: No Wound Description Classification: Grade 2 Exudate Amount: Medium Exudate Type: Serosanguineous Exudate Color: red, brown Foul Odor After Cleansing: No Slough/Fibrino Yes Wound Bed Granulation Amount: Large (67-100%) Exposed Structure Granulation Quality: Red, Pink, Pale Fascia Exposed: No Necrotic Amount: Small (1-33%) Fat Layer (Subcutaneous Tissue) Exposed: Yes Necrotic Quality: Adherent Slough Tendon Exposed: No Muscle Exposed: No Joint Exposed: No Bone Exposed: No Treatment Notes Wound #2 (Lower Leg) Wound Laterality: Left, Posterior Cleanser Byram Ancillary Kit - 15 Day Supply Discharge Instruction: Use supplies as instructed; Kit contains: (15) Saline Bullets; (15) 3x3 Gauze; 15 pr Gloves Peri-Wound Care ZYLEN, WENIG ([id number]) Topical Primary Dressing Xeroform 5x9-HBD (in/in) Discharge Instruction: Apply Xeroform 5x9-HBD (in/in) as directed Secondary Dressing ABD Pad 5x9 (in/in) Discharge Instruction: Cover with ABD pad Secured With Medipore Tape - 42M Medipore H Soft Cloth Surgical Tape, 2x2 (in/yd) Compression Wrap Compression Stockings Add-Ons Electronic Signature(s) Signed: 04/11/2021 8:30:35 AM By: Carlene Coria RN Entered By: Carlene Coria on 04/10/2021 15:12:22 [name]  ([id number]) -------------------------------------------------------------------------------- Vitals Details Patient Name: [name] Date of Service: 04/10/2021  2:45 PM Medical Record Number: [id number] Patient Account Number: [id number] Date of Birth/Sex: [birth year] (76 y.o. M) Treating RN: Carlene Coria Primary Care Solae Norling: Tommi Rumps Other Clinician: Referring Avanish Cerullo: Tommi Rumps Treating Naturi Alarid/Extender: Skipper Cliche in Treatment: 2 Vital Signs Time Taken: 15:05 Temperature (F): 98.3 Height (in): 69 Pulse (bpm): 69 Weight (lbs): 183 Respiratory Rate (breaths/min): 18 Body Mass Index (BMI): 27 Blood Pressure (mmHg): 135/67 Reference Range: 80 - 120 mg / dl Electronic Signature(s) Signed: 04/11/2021 8:30:35 AM By: Carlene Coria RN Entered By: Carlene Coria on 04/10/2021 15:06:06

## 2021-04-10 NOTE — Progress Notes (Addendum)
CHAO, BLAZEJEWSKI ([id number]) Visit Report for 04/10/2021 Chief Complaint Document Details Patient Name: [name], [name] Date of Service: 04/10/2021 2:45 PM Medical Record Number: [id number] Patient Account Number: [id number] Date of Birth/Sex: 04/03/1945 (76 y.o. M) Treating RN: Carlene Coria Primary Care Provider: Tommi Rumps Other Clinician: Referring Provider: Tommi Rumps Treating Provider/Extender: Skipper Cliche in Treatment: 2 Information Obtained from: Patient Chief Complaint Left leg ulcer Electronic Signature(s) Signed: 04/10/2021 2:53:21 PM By: Worthy Keeler PA-C Entered By: Worthy Keeler on 04/10/2021 14:53:21 [name] ([id number]) -------------------------------------------------------------------------------- HPI Details Patient Name: [name] Date of Service: 04/10/2021 2:45 PM Medical Record Number: [id number] Patient Account Number: [id number] Date of Birth/Sex: 15-Feb-1945 (76 y.o. M) Treating RN: Carlene Coria Primary Care Provider: Tommi Rumps Other Clinician: Referring Provider: Tommi Rumps Treating Provider/Extender: Skipper Cliche in Treatment: 2 History of Present Illness HPI Description: 03/27/2021 upon evaluation today patient appears to be doing well with regard to his wound. This was actually an issue that he has had since November 2022. He does have evidence of leg swelling and even early signs of lymphedema. Subsequently he tells me that his daughter has been putting some "green-cream" on this. I am not sure what that is but nonetheless he does seem to have a lot of healing compared to what he had previous. Fortunately there does not appear to be any signs of active infection locally nor systemically at this time which is great news. Honestly as dry as his leg is and even the wound bed I think that Xeroform gauze would probably be an awesome option for him to try to help with getting this area to heal appropriately. Patient  does have a history of diabetes mellitus type 2, chronic venous insufficiency, hypertension, chronic kidney disease stage III, congestive heart failure, and coronary artery disease. 04/03/2021 upon evaluation today patient's legs actually appear to be doing great his left leg where the wounds are is actually measuring a bit smaller and the wounds themselves are definitely smaller. I am actually extremely pleased with where we stand today and I see no signs of active infection locally or systemically at this time which is great news. No fevers, chills, nausea, vomiting, or diarrhea. 04/10/2021 upon evaluation today patient's wounds are actually showing signs of improvement both anterior and posterior. I think that he is making excellent progress and overall I see no need for any major changes here at this point. Electronic Signature(s) Signed: 04/10/2021 3:59:56 PM By: Worthy Keeler PA-C Entered By: Worthy Keeler on 04/10/2021 15:59:56 [name] ([id number]) -------------------------------------------------------------------------------- Physical Exam Details Patient Name: [name] Date of Service: 04/10/2021 2:45 PM Medical Record Number: [id number] Patient Account Number: [id number] Date of Birth/Sex: 1946-01-23 (76 y.o. M) Treating RN: Carlene Coria Primary Care Provider: Tommi Rumps Other Clinician: Referring Provider: Tommi Rumps Treating Provider/Extender: Skipper Cliche in Treatment: 2 Constitutional Well-nourished and well-hydrated in no acute distress. Respiratory normal breathing without difficulty. Psychiatric this patient is able to make decisions and demonstrates good insight into disease process. Alert and Oriented x 3. pleasant and cooperative. Notes Upon inspection patient's wound again is showing signs of excellent granulation and epithelization at this point. I do not see any evidence of active infection locally or systemically which is great and  overall I feel like we are very close to complete resolution. Especially on the front although the posterior aspect is a little bit larger overall I think that is still making all some progress. Electronic  Signature(s) Signed: 04/10/2021 4:00:45 PM By: Worthy Keeler PA-C Entered By: Worthy Keeler on 04/10/2021 16:00:45 [name] ([id number]) -------------------------------------------------------------------------------- Physician Orders Details Patient Name: [name] Date of Service: 04/10/2021 2:45 PM Medical Record Number: [id number] Patient Account Number: [id number] Date of Birth/Sex: April 19, 1945 (76 y.o. M) Treating RN: Carlene Coria Primary Care Provider: Tommi Rumps Other Clinician: Referring Provider: Tommi Rumps Treating Provider/Extender: Skipper Cliche in Treatment: 2 Verbal / Phone Orders: No Diagnosis Coding ICD-10 Coding Code Description E11.622 Type 2 diabetes mellitus with other skin ulcer I87.2 Venous insufficiency (chronic) (peripheral) L97.822 Non-pressure chronic ulcer of other part of left lower leg with fat layer exposed I10 Essential (primary) hypertension N18.30 Chronic kidney disease, stage 3 unspecified I50.42 Chronic combined systolic (congestive) and diastolic (congestive) heart failure I25.10 Atherosclerotic heart disease of native coronary artery without angina pectoris Follow-up Appointments o Return Appointment in 1 week. Bathing/ Shower/ Hygiene o May shower; gently cleanse wound with antibacterial soap, rinse and pat dry prior to dressing wounds Anesthetic (Use 'Patient Medications' Section for Anesthetic Order Entry) o Lidocaine applied to wound bed Edema Control - Lymphedema / Segmental Compressive Device / Other o Tubigrip single layer applied. - size D o Elevate leg(s) parallel to the floor when sitting. o DO YOUR BEST to sleep in the bed at night. DO NOT sleep in your recliner. Long hours of sitting in a  recliner leads to swelling of the legs and/or potential wounds on your backside. Wound Treatment Wound #1 - Lower Leg Wound Laterality: Left, Anterior Cleanser: Byram Ancillary Kit - 15 Day Supply (Generic) 3 x Per Week/30 Days Discharge Instructions: Use supplies as instructed; Kit contains: (15) Saline Bullets; (15) 3x3 Gauze; 15 pr Gloves Primary Dressing: Xeroform 5x9-HBD (in/in) (Generic) 3 x Per Week/30 Days Discharge Instructions: Apply Xeroform 5x9-HBD (in/in) as directed Secondary Dressing: ABD Pad 5x9 (in/in) (Generic) 3 x Per Week/30 Days Discharge Instructions: Cover with ABD pad Secured With: Medipore Tape - 4M Medipore H Soft Cloth Surgical Tape, 2x2 (in/yd) (Generic) 3 x Per Week/30 Days Wound #2 - Lower Leg Wound Laterality: Left, Posterior Cleanser: Byram Ancillary Kit - 15 Day Supply (Generic) 3 x Per Week/30 Days Discharge Instructions: Use supplies as instructed; Kit contains: (15) Saline Bullets; (15) 3x3 Gauze; 15 pr Gloves Primary Dressing: Xeroform 5x9-HBD (in/in) (Generic) 3 x Per Week/30 Days Discharge Instructions: Apply Xeroform 5x9-HBD (in/in) as directed Secondary Dressing: ABD Pad 5x9 (in/in) (Generic) 3 x Per Week/30 Days Discharge Instructions: Cover with ABD pad Secured With: Medipore Tape - 4M Medipore H Soft Cloth Surgical Tape, 2x2 (in/yd) (Generic) 3 x Per Week/30 Days MARQUIZE, SEIB ([id number]) Electronic Signature(s) Signed: 04/10/2021 4:03:57 PM By: Worthy Keeler PA-C Signed: 04/11/2021 8:30:35 AM By: Carlene Coria RN Entered By: Carlene Coria on 04/10/2021 15:15:07 [name] ([id number]) -------------------------------------------------------------------------------- Problem List Details Patient Name: [name] Date of Service: 04/10/2021 2:45 PM Medical Record Number: [id number] Patient Account Number: [id number] Date of Birth/Sex: January 26, 1946 (76 y.o. M) Treating RN: Carlene Coria Primary Care Provider: Tommi Rumps Other  Clinician: Referring Provider: Tommi Rumps Treating Provider/Extender: Skipper Cliche in Treatment: 2 Active Problems ICD-10 Encounter Code Description Active Date MDM Diagnosis E11.622 Type 2 diabetes mellitus with other skin ulcer 03/27/2021 No Yes I87.2 Venous insufficiency (chronic) (peripheral) 03/27/2021 No Yes L97.822 Non-pressure chronic ulcer of other part of left lower leg with fat layer 03/27/2021 No Yes exposed [place] (primary) hypertension 03/27/2021 No Yes N18.30 Chronic kidney disease, stage 3 unspecified  03/27/2021 No Yes I50.42 Chronic combined systolic (congestive) and diastolic (congestive) heart 03/27/2021 No Yes failure I25.10 Atherosclerotic heart disease of native coronary artery without angina 03/27/2021 No Yes pectoris Inactive Problems Resolved Problems Electronic Signature(s) Signed: 04/10/2021 2:53:14 PM By: Worthy Keeler PA-C Entered By: Worthy Keeler on 04/10/2021 14:53:14 [name] ([id number]) -------------------------------------------------------------------------------- Progress Note Details Patient Name: [name] Date of Service: 04/10/2021 2:45 PM Medical Record Number: [id number] Patient Account Number: [id number] Date of Birth/Sex: 05-19-1945 (76 y.o. M) Treating RN: Carlene Coria Primary Care Provider: Tommi Rumps Other Clinician: Referring Provider: Tommi Rumps Treating Provider/Extender: Skipper Cliche in Treatment: 2 Subjective Chief Complaint Information obtained from Patient Left leg ulcer History of Present Illness (HPI) 03/27/2021 upon evaluation today patient appears to be doing well with regard to his wound. This was actually an issue that he has had since November 2022. He does have evidence of leg swelling and even early signs of lymphedema. Subsequently he tells me that his daughter has been putting some "green-cream" on this. I am not sure what that is but nonetheless he does seem to have a  lot of healing compared to what he had previous. Fortunately there does not appear to be any signs of active infection locally nor systemically at this time which is great news. Honestly as dry as his leg is and even the wound bed I think that Xeroform gauze would probably be an awesome option for him to try to help with getting this area to heal appropriately. Patient does have a history of diabetes mellitus type 2, chronic venous insufficiency, hypertension, chronic kidney disease stage III, congestive heart failure, and coronary artery disease. 04/03/2021 upon evaluation today patient's legs actually appear to be doing great his left leg where the wounds are is actually measuring a bit smaller and the wounds themselves are definitely smaller. I am actually extremely pleased with where we stand today and I see no signs of active infection locally or systemically at this time which is great news. No fevers, chills, nausea, vomiting, or diarrhea. 04/10/2021 upon evaluation today patient's wounds are actually showing signs of improvement both anterior and posterior. I think that he is making excellent progress and overall I see no need for any major changes here at this point. Objective Constitutional Well-nourished and well-hydrated in no acute distress. Vitals Time Taken: 3:05 PM, Height: 69 in, Weight: 183 lbs, BMI: 27, Temperature: 98.3 F, Pulse: 69 bpm, Respiratory Rate: 18 breaths/min, Blood Pressure: 135/67 mmHg. Respiratory normal breathing without difficulty. Psychiatric this patient is able to make decisions and demonstrates good insight into disease process. Alert and Oriented x 3. pleasant and cooperative. General Notes: Upon inspection patient's wound again is showing signs of excellent granulation and epithelization at this point. I do not see any evidence of active infection locally or systemically which is great and overall I feel like we are very close to complete resolution.  Especially on the front although the posterior aspect is a little bit larger overall I think that is still making all some progress. Integumentary (Hair, Skin) Wound #1 status is Open. Original cause of wound was Gradually Appeared. The date acquired was: 12/28/2020. The wound has been in treatment 2 weeks. The wound is located on the Left,Anterior Lower Leg. The wound measures 5cm length x 2cm width x 0.1cm depth; 7.854cm^2 area and 0.785cm^3 volume. There is Fat Layer (Subcutaneous Tissue) exposed. There is no tunneling or undermining noted. There is a medium amount of serosanguineous  drainage noted. There is large (67-100%) pink, pale, hyper - granulation within the wound bed. There is a small (1-33%) amount of necrotic tissue within the wound bed including Adherent Slough. Wound #2 status is Open. Original cause of wound was Gradually Appeared. The date acquired was: 12/28/2020. The wound has been in treatment 2 weeks. The wound is located on the Left,Posterior Lower Leg. The wound measures 2.7cm length x 2.5cm width x 0.1cm depth; 5.301cm^2 area and 0.53cm^3 volume. There is Fat Layer (Subcutaneous Tissue) exposed. There is no tunneling or undermining noted. There is a medium amount of serosanguineous drainage noted. There is large (67-100%) red, pink, pale granulation within the wound bed. There is a small (1-33%) amount of necrotic tissue within the wound bed including Adherent Slough. AXIEL, FJELD ([id number]) Assessment Active Problems ICD-10 Type 2 diabetes mellitus with other skin ulcer Venous insufficiency (chronic) (peripheral) Non-pressure chronic ulcer of other part of left lower leg with fat layer exposed Essential (primary) hypertension Chronic kidney disease, stage 3 unspecified Chronic combined systolic (congestive) and diastolic (congestive) heart failure Atherosclerotic heart disease of native coronary artery without angina pectoris Plan Follow-up Appointments: Return  Appointment in 1 week. Bathing/ Shower/ Hygiene: May shower; gently cleanse wound with antibacterial soap, rinse and pat dry prior to dressing wounds Anesthetic (Use 'Patient Medications' Section for Anesthetic Order Entry): Lidocaine applied to wound bed Edema Control - Lymphedema / Segmental Compressive Device / Other: Tubigrip single layer applied. - size D Elevate leg(s) parallel to the floor when sitting. DO YOUR BEST to sleep in the bed at night. DO NOT sleep in your recliner. Long hours of sitting in a recliner leads to swelling of the legs and/or potential wounds on your backside. WOUND #1: - Lower Leg Wound Laterality: Left, Anterior Cleanser: Byram Ancillary Kit - 15 Day Supply (Generic) 3 x Per Week/30 Days Discharge Instructions: Use supplies as instructed; Kit contains: (15) Saline Bullets; (15) 3x3 Gauze; 15 pr Gloves Primary Dressing: Xeroform 5x9-HBD (in/in) (Generic) 3 x Per Week/30 Days Discharge Instructions: Apply Xeroform 5x9-HBD (in/in) as directed Secondary Dressing: ABD Pad 5x9 (in/in) (Generic) 3 x Per Week/30 Days Discharge Instructions: Cover with ABD pad Secured With: Medipore Tape - 15M Medipore H Soft Cloth Surgical Tape, 2x2 (in/yd) (Generic) 3 x Per Week/30 Days WOUND #2: - Lower Leg Wound Laterality: Left, Posterior Cleanser: Byram Ancillary Kit - 15 Day Supply (Generic) 3 x Per Week/30 Days Discharge Instructions: Use supplies as instructed; Kit contains: (15) Saline Bullets; (15) 3x3 Gauze; 15 pr Gloves Primary Dressing: Xeroform 5x9-HBD (in/in) (Generic) 3 x Per Week/30 Days Discharge Instructions: Apply Xeroform 5x9-HBD (in/in) as directed Secondary Dressing: ABD Pad 5x9 (in/in) (Generic) 3 x Per Week/30 Days Discharge Instructions: Cover with ABD pad Secured With: Medipore Tape - 15M Medipore H Soft Cloth Surgical Tape, 2x2 (in/yd) (Generic) 3 x Per Week/30 Days 1. I would recommend currently that we going to continue with the wound care measures as  before and the patient is in agreement with the plan. This includes the use of the Xeroform gauze which I think is doing a good job. 2. We will continue with the ABD pad. 3. I am also can recommend that we continue with the Tubigrip size D which I think is doing a great job. We will see patient back for reevaluation in 1 week here in the clinic. If anything worsens or changes patient will contact our office for additional recommendations. Electronic Signature(s) Signed: 04/10/2021 4:03:21 PM By: Worthy Keeler  PA-C Entered By: Worthy Keeler on 04/10/2021 16:03:20 [name] ([id number]) -------------------------------------------------------------------------------- SuperBill Details Patient Name: [name] Date of Service: 04/10/2021 Medical Record Number: [id number] Patient Account Number: [id number] Date of Birth/Sex: 01/03/1946 (76 y.o. M) Treating RN: Carlene Coria Primary Care Provider: Tommi Rumps Other Clinician: Referring Provider: Tommi Rumps Treating Provider/Extender: Skipper Cliche in Treatment: 2 Diagnosis Coding ICD-10 Codes Code Description [phone] Type 2 diabetes mellitus with other skin ulcer I87.2 Venous insufficiency (chronic) (peripheral) L97.822 Non-pressure chronic ulcer of other part of left lower leg with fat layer exposed I10 Essential (primary) hypertension N18.30 Chronic kidney disease, stage 3 unspecified I50.42 Chronic combined systolic (congestive) and diastolic (congestive) heart failure I25.10 Atherosclerotic heart disease of native coronary artery without angina pectoris Facility Procedures CPT4 Code: [id number] Description: 99213 - WOUND CARE VISIT-LEV 3 EST PT Modifier: Quantity: 1 Physician Procedures CPT4 Code: [id number] Description: 99214 - WC PHYS LEVEL 4 - EST PT Modifier: Quantity: 1 CPT4 Code: Description: ICD-10 Diagnosis Description E11.622 Type 2 diabetes mellitus with other skin ulcer I87.2 Venous insufficiency  (chronic) (peripheral) L97.822 Non-pressure chronic ulcer of other part of left lower leg with fat lay I10 Essential (primary)  hypertension Modifier: er exposed Quantity: Electronic Signature(s) Signed: 04/10/2021 4:03:32 PM By: Worthy Keeler PA-C Entered By: Worthy Keeler on 04/10/2021 16:03:32

## 2021-04-14 ENCOUNTER — Encounter: Payer: Self-pay | Admitting: Emergency Medicine

## 2021-04-14 ENCOUNTER — Emergency Department
Admission: EM | Admit: 2021-04-14 | Discharge: 2021-04-14 | Disposition: A | Discharge status: Home / Self Care | Payer: PPO | Attending: Emergency Medicine | Admitting: Emergency Medicine

## 2021-04-14 ENCOUNTER — Other Ambulatory Visit: Payer: Self-pay

## 2021-04-14 ENCOUNTER — Emergency Department: Payer: PPO

## 2021-04-14 DIAGNOSIS — I2510 Atherosclerotic heart disease of native coronary artery without angina pectoris: Secondary | ICD-10-CM | POA: Insufficient documentation

## 2021-04-14 DIAGNOSIS — I110 Hypertensive heart disease with heart failure: Secondary | ICD-10-CM | POA: Diagnosis not present

## 2021-04-14 DIAGNOSIS — I5023 Acute on chronic systolic (congestive) heart failure: Secondary | ICD-10-CM | POA: Diagnosis present

## 2021-04-14 DIAGNOSIS — E119 Type 2 diabetes mellitus without complications: Secondary | ICD-10-CM | POA: Insufficient documentation

## 2021-04-14 DIAGNOSIS — R0902 Hypoxemia: Secondary | ICD-10-CM | POA: Diagnosis not present

## 2021-04-14 DIAGNOSIS — B029 Zoster without complications: Secondary | ICD-10-CM | POA: Insufficient documentation

## 2021-04-14 DIAGNOSIS — R531 Weakness: Secondary | ICD-10-CM | POA: Diagnosis not present

## 2021-04-14 DIAGNOSIS — J90 Pleural effusion, not elsewhere classified: Secondary | ICD-10-CM | POA: Diagnosis not present

## 2021-04-14 DIAGNOSIS — I517 Cardiomegaly: Secondary | ICD-10-CM | POA: Diagnosis not present

## 2021-04-14 DIAGNOSIS — R21 Rash and other nonspecific skin eruption: Secondary | ICD-10-CM | POA: Diagnosis present

## 2021-04-14 LAB — CBC WITH DIFFERENTIAL/PLATELET
Abs Immature Granulocytes: 0.01 10*3/uL (ref 0.00–0.07)
Basophils Absolute: 0 10*3/uL (ref 0.0–0.1)
Basophils Relative: 1 %
Eosinophils Absolute: 0.1 10*3/uL (ref 0.0–0.5)
Eosinophils Relative: 4 %
HCT: 39.1 % (ref 39.0–52.0)
Hemoglobin: 12.1 g/dL — ABNORMAL LOW (ref 13.0–17.0)
Immature Granulocytes: 0 %
Lymphocytes Relative: 19 %
Lymphs Abs: 0.6 10*3/uL — ABNORMAL LOW (ref 0.7–4.0)
MCH: 31.8 pg (ref 26.0–34.0)
MCHC: 30.9 g/dL (ref 30.0–36.0)
MCV: 102.9 fL — ABNORMAL HIGH (ref 80.0–100.0)
Monocytes Absolute: 0.6 10*3/uL (ref 0.1–1.0)
Monocytes Relative: 17 %
Neutro Abs: 2 10*3/uL (ref 1.7–7.7)
Neutrophils Relative %: 59 %
Platelets: 122 10*3/uL — ABNORMAL LOW (ref 150–400)
RBC: 3.8 MIL/uL — ABNORMAL LOW (ref 4.22–5.81)
RDW: 18.5 % — ABNORMAL HIGH (ref 11.5–15.5)
Smear Review: NORMAL
WBC: 3.4 10*3/uL — ABNORMAL LOW (ref 4.0–10.5)
nRBC: 0 % (ref 0.0–0.2)

## 2021-04-14 LAB — COMPREHENSIVE METABOLIC PANEL
ALT: 17 U/L (ref 0–44)
AST: 24 U/L (ref 15–41)
Albumin: 3.2 g/dL — ABNORMAL LOW (ref 3.5–5.0)
Alkaline Phosphatase: 86 U/L (ref 38–126)
Anion gap: 12 (ref 5–15)
BUN: 28 mg/dL — ABNORMAL HIGH (ref 8–23)
CO2: 24 mmol/L (ref 22–32)
Calcium: 9 mg/dL (ref 8.9–10.3)
Chloride: 109 mmol/L (ref 98–111)
Creatinine, Ser: 1.7 mg/dL — ABNORMAL HIGH (ref 0.61–1.24)
GFR, Estimated: 42 mL/min — ABNORMAL LOW (ref >60)
Glucose, Bld: 147 mg/dL — ABNORMAL HIGH (ref 70–99)
Potassium: 3.9 mmol/L (ref 3.5–5.1)
Sodium: 145 mmol/L (ref 135–145)
Total Bilirubin: 2.6 mg/dL — ABNORMAL HIGH (ref 0.3–1.2)
Total Protein: 7.1 g/dL (ref 6.5–8.1)

## 2021-04-14 LAB — TROPONIN I (HIGH SENSITIVITY): Troponin I (High Sensitivity): 49 ng/L — ABNORMAL HIGH (ref <18)

## 2021-04-14 LAB — BRAIN NATRIURETIC PEPTIDE: B Natriuretic Peptide: >4500 pg/mL — ABNORMAL HIGH (ref 0.0–100.0)

## 2021-04-14 MED ORDER — VALACYCLOVIR HCL 1 G PO TABS: 1000.0000 mg | ORAL_TABLET | Freq: Two times a day (BID) | ORAL | 0 refills | Status: DC

## 2021-04-14 MED ORDER — FUROSEMIDE 10 MG/ML IJ SOLN
40.0000 mg | Freq: Once | INTRAMUSCULAR | Status: AC
  Administered 2021-04-14: 40 mg via INTRAVENOUS
  Filled 2021-04-14: qty 4

## 2021-04-14 MED ORDER — LIDOCAINE 4 % EX GEL: 1.0000 "application " | Freq: Four times a day (QID) | CUTANEOUS | 0 refills | Status: DC | PRN

## 2021-04-14 NOTE — ED Notes (Addendum)
Pt consistently 85-91% while sitting in the bed. Cari, NP made aware. Pt placed on 2L [place].  ?

## 2021-04-14 NOTE — ED Notes (Signed)
See triage note   presents with rash to right side of chest  states area was painful couple of days ago  then broke out in a rash  states area is very painful ?

## 2021-04-14 NOTE — ED Notes (Signed)
Pt to xray at this time. Then will do labs and EKG. ?

## 2021-04-14 NOTE — ED Notes (Signed)
Pt wheeled to waiting room. Pt once again verbalized understanding of AMA risks.  ?

## 2021-04-14 NOTE — ED Triage Notes (Signed)
Pt comes into the ED via POV c/o shingles on the right hand side of his chest.  Pt in NAD at this time.  ?

## 2021-04-14 NOTE — Discharge Instructions (Addendum)
You have chosen to sign out [place].  Please be aware that he may return to the emergency department at any time if you change your mind or start feeling worse. ? ?Please follow-up with your primary care provider as well as the heart failure clinic. ?

## 2021-04-14 NOTE — ED Provider Notes (Signed)
? ?[facility] ?Provider Note ? ? ? Event Date/Time  ? First MD Initiated Contact with Patient 04/14/21 1204   ?  (approximate) ? ? ?History  ? ?Herpes Zoster ? ? ?HPI ? ?Christopher Skillern. is a 76 y.o. male with history of hypertension, CHF, CAD, diabetes, and as listed in EMR presents to the emergency department for treatment and evaluation of rash that he believes are shingles. Symptoms started 2 days ago. He has also had to take extra fluid pills, but is still retaining fluids and feels weak. In addition, he believes that his hernia has returned. He had hernia repair a few months ago, but reports symptoms have returned. He denies scrotal pain, but has swelling bilaterally.. ? ?  ? ? ?Physical Exam  ? ?Triage Vital Signs: ?ED Triage Vitals  ?Enc Vitals Group  ?   BP 04/14/21 1201 (!) 165/95  ?   Pulse Rate 04/14/21 1201 95  ?   Resp 04/14/21 1201 (!) 22  ?   Temp 04/14/21 1201 97.9 ?F (36.6 ?C)  ?   Temp Source 04/14/21 1201 Oral  ?   SpO2 04/14/21 1201 98 %  ?   Weight 04/14/21 1158 174 lb 9.7 oz (79.2 kg)  ?   Height 04/14/21 1158 '5\' 9"'$  (1.753 m)  ?   Head Circumference --   ?   Peak Flow --   ?   Pain Score 04/14/21 1158 2  ?   Pain Loc --   ?   Pain Edu? --   ?   Excl. in [place]? --   ? ? ?Most recent vital signs: ?Vitals:  ? 04/14/21 1201 04/14/21 1335  ?BP: (!) 165/95 (!) 150/75  ?Pulse: 95 (!) 58  ?Resp: (!) 22 20  ?Temp: 97.9 ?F (36.6 ?C) 97.9 ?F (36.6 ?C)  ?SpO2: 98% 94%  ? ? ?General: Awake, no distress.  ?CV:  Good peripheral perfusion.  ?Resp:  Normal effort. Diminished throughout. ?Abd:  Distended. ?Other:  Scrotum without erythema or tenderness. Equal in size bilaterally.  Cremasteric reflexes intact ? ? ?ED Results / Procedures / Treatments  ? ?Labs ?(all labs ordered are listed, but only abnormal results are displayed) ?Labs Reviewed  ?CBC WITH DIFFERENTIAL/PLATELET - Abnormal; Notable for the following components:  ?    Result Value  ? WBC 3.4 (*)   ? RBC 3.80 (*)   ? Hemoglobin  12.1 (*)   ? MCV 102.9 (*)   ? RDW 18.5 (*)   ? Platelets 122 (*)   ? Lymphs Abs 0.6 (*)   ? All other components within normal limits  ?BRAIN NATRIURETIC PEPTIDE - Abnormal; Notable for the following components:  ? B Natriuretic Peptide >4,500.0 (*)   ? All other components within normal limits  ?COMPREHENSIVE METABOLIC PANEL - Abnormal; Notable for the following components:  ? Glucose, Bld 147 (*)   ? BUN 28 (*)   ? Creatinine, Ser 1.70 (*)   ? Albumin 3.2 (*)   ? Total Bilirubin 2.6 (*)   ? GFR, Estimated 42 (*)   ? All other components within normal limits  ?TROPONIN I (HIGH SENSITIVITY) - Abnormal; Notable for the following components:  ? Troponin I (High Sensitivity) 49 (*)   ? All other components within normal limits  ?RESP PANEL BY RT-PCR (FLU A&B, COVID) ARPGX2  ? ? ? ?EKG ? ?.cbt ? ? ?RADIOLOGY ? ?Image and radiology report reviewed by me. ? ?Chest x-ray shows cardiomegaly and  bilateral pleural effusions. ? ?PROCEDURES: ? ?Critical Care performed: No ? ?Procedures ? ? ?MEDICATIONS ORDERED IN ED: ?Medications  ?furosemide (LASIX) injection 40 mg (40 mg Intravenous Given 04/14/21 1422)  ? ? ? ?IMPRESSION / MDM / ASSESSMENT AND PLAN / ED COURSE  ? ?I have reviewed the triage note. ? ?Differential diagnosis includes, but is not limited to, Shingles, dermatitis ? ?76 year old male presenting to the emergency department for treatment and evaluation of shingles, weakness, fluid retention, and hernia.  On exam, his lungs are diminished throughout.  Shingles rash on right thoracic dermatome.  Scrotum is without evidence of strangulated hernia.  Bilateral lower extremities with pitting edema into the mid thighs. ? ?Plan is to get a chest x-ray, EKG, labs to evaluate for acute CHF exacerbation.  Patient aware and agreeable to this plan. ? ?----------------------------------------- ?2:18 PM on 04/14/2021 ?----------------------------------------- ?Lasix ordered. Patient's oxygen saturation now staying in mid 80's with  good waveform on the monitor. He doesn't feel any  more short of breath than usual and does not appear to be in distress, however the saturations were in the mid to upper 90s when he first got here today. Not on oxygen at home. 2 liters of oxygen applied and he has responded well--now 94%. Plan to admit for CHF exacerbation and hypoxia. He is aware and agreeable. ? ?----------------------------------------- ?2:42 PM on 04/14/2021 ?----------------------------------------- ? ?Patient now refusing admission despite being advised that his low oxygen saturation may lead to death.  Patient states that he is ready to go whenever the good Reita Cliche wants him.  Wife at bedside who encouraged him to stay, however he continues to refuse and tells me that he understands the risk that he is taking. ? ?Patient advised to call and schedule follow-up appointment with his primary care provider as well as the CHF clinic.  Prescriptions for valacyclovir and lidocaine gel submitted to his pharmacy. ? ?He was reminded that he may return to the emergency department at any point if his symptoms change, worsen, or he decides that he should have stayed. ? ?  ? ? ?FINAL CLINICAL IMPRESSION(S) / ED DIAGNOSES  ? ?Final diagnoses:  ?Hypoxia  ?Acute on chronic systolic congestive heart failure ([place])  ?Herpes zoster without complication  ? ? ? ?Rx / DC Orders  ? ?ED Discharge Orders   ? ?      Ordered  ?  Lidocaine 4 % GEL  4 times daily PRN       ? 04/14/21 1438  ?  valACYclovir (VALTREX) 1000 MG tablet  2 times daily       ? 04/14/21 1438  ? ?  ?  ? ?  ? ? ? ?Note:  This document was prepared using Dragon voice recognition software and may include unintentional dictation errors. ?  Victorino Dike, FNP ?04/14/21 1514 ? ?  ?Carrie Mew, MD ?04/16/21 1511 ? ?

## 2021-04-14 NOTE — ED Provider Notes (Signed)
ED ECG REPORT ?IArta Silence, the attending physician, personally viewed and interpreted this ECG. ? ?Date: 04/14/2021 ?EKG Time: 1233 ?Rate: 68 ?Rhythm: normal sinus rhythm ?QRS Axis: normal ?Intervals: Prolonged QTc ?ST/T Wave abnormalities: Nonspecific T wave abnormalities ?Narrative Interpretation: no evidence of acute ischemia; no significant change when compared to EKG of 08/27/2019 ? ?  ?Arta Silence, MD ?04/14/21 1300 ? ?

## 2021-04-14 NOTE — ED Notes (Signed)
Pt requesting to leave AMA. Pt explained risks of leaving AMA including death. Pt verbalized understanding. ?

## 2021-04-17 ENCOUNTER — Other Ambulatory Visit: Payer: Self-pay

## 2021-04-17 ENCOUNTER — Encounter (HOSPITAL_BASED_OUTPATIENT_CLINIC_OR_DEPARTMENT_OTHER): Payer: PPO | Admitting: Internal Medicine

## 2021-04-17 DIAGNOSIS — L97822 Non-pressure chronic ulcer of other part of left lower leg with fat layer exposed: Secondary | ICD-10-CM

## 2021-04-17 DIAGNOSIS — E11622 Type 2 diabetes mellitus with other skin ulcer: Secondary | ICD-10-CM | POA: Diagnosis not present

## 2021-04-17 DIAGNOSIS — I872 Venous insufficiency (chronic) (peripheral): Secondary | ICD-10-CM

## 2021-04-17 NOTE — Progress Notes (Signed)
[name], [name] ([id number]) Visit Report for 04/17/2021 Chief Complaint Document Details Patient Name: [name], [name] Date of Service: 04/17/2021 11:00 AM Medical Record Number: [id number] Patient Account Number: [id number] Date of Birth/Sex: 19-Jun-1945 (75 y.o. M) Treating RN: [name] Primary Care Provider: Tommi Rumps Other Clinician: Referring Provider: Tommi Rumps Treating Provider/Extender: Yaakov Guthrie in Treatment: 3 Information Obtained from: Patient Chief Complaint Left leg ulcer Electronic Signature(s) Signed: 04/17/2021 11:39:14 AM By: Kalman Shan DO Entered By: Kalman Shan on 04/17/2021 11:34:13 [name] ([id number]) -------------------------------------------------------------------------------- HPI Details Patient Name: [name] Date of Service: 04/17/2021 11:00 AM Medical Record Number: [id number] Patient Account Number: [id number] Date of Birth/Sex: 08/24/1945 (76 y.o. M) Treating RN: [name] Primary Care Provider: Tommi Rumps Other Clinician: Referring Provider: Tommi Rumps Treating Provider/Extender: Yaakov Guthrie in Treatment: 3 History of Present Illness HPI Description: 03/27/2021 upon evaluation today patient appears to be doing well with regard to his wound. This was actually an issue that he has had since November 2022. He does have evidence of leg swelling and even early signs of lymphedema. Subsequently he tells me that his daughter has been putting some "green-cream" on this. I am not sure what that is but nonetheless he does seem to have a lot of healing compared to what he had previous. Fortunately there does not appear to be any signs of active infection locally nor systemically at this time which is great news. Honestly as dry as his leg is and even the wound bed I think that Xeroform gauze would probably be an awesome option for him to try to help with getting this area to heal  appropriately. Patient does have a history of diabetes mellitus type 2, chronic venous insufficiency, hypertension, chronic kidney disease stage III, congestive heart failure, and coronary artery disease. 04/03/2021 upon evaluation today patient's legs actually appear to be doing great his left leg where the wounds are is actually measuring a bit smaller and the wounds themselves are definitely smaller. I am actually extremely pleased with where we stand today and I see no signs of active infection locally or systemically at this time which is great news. No fevers, chills, nausea, vomiting, or diarrhea. 04/10/2021 upon evaluation today patient's wounds are actually showing signs of improvement both anterior and posterior. I think that he is making excellent progress and overall I see no need for any major changes here at this point. 3/13; patient presents for follow-up. He has no issues or complaints today. He reports developing shingles to his chest with some open draining vesicles. He does have some scabbed over lesions. He saw his primary care provider for this issue. He was started on medication. His daughter has been doing the dressing changing to his left leg. He has no issues or complaints about the leg wound. Electronic Signature(s) Signed: 04/17/2021 11:39:14 AM By: Kalman Shan DO Entered By: Kalman Shan on 04/17/2021 11:36:05 [name] ([id number]) -------------------------------------------------------------------------------- Physical Exam Details Patient Name: [name] Date of Service: 04/17/2021 11:00 AM Medical Record Number: [id number] Patient Account Number: [id number] Date of Birth/Sex: [birth year] (75 y.o. M) Treating RN: [name] Primary Care Provider: Tommi Rumps Other Clinician: Referring Provider: Tommi Rumps Treating Provider/Extender: Yaakov Guthrie in Treatment:  3 Constitutional . Cardiovascular . Psychiatric . Notes Left lower extremity: To the distal aspect there are 2 open wounds with granulation tissue throughout. Good edema control. No surrounding signs of infection. Electronic Signature(s) Signed: 04/17/2021 11:39:14 AM By: Kalman Shan DO  Entered By: Kalman Shan on 04/17/2021 11:36:54 [name] ([id number]) -------------------------------------------------------------------------------- Physician Orders Details Patient Name: [name] Date of Service: 04/17/2021 11:00 AM Medical Record Number: [id number] Patient Account Number: [id number] Date of Birth/Sex: 10-13-1945 (75 y.o. M) Treating RN: [name] Primary Care Provider: Tommi Rumps Other Clinician: Referring Provider: Tommi Rumps Treating Provider/Extender: Yaakov Guthrie in Treatment: 3 Verbal / Phone Orders: No Diagnosis Coding ICD-10 Coding Code Description E11.622 Type 2 diabetes mellitus with other skin ulcer I87.2 Venous insufficiency (chronic) (peripheral) L97.822 Non-pressure chronic ulcer of other part of left lower leg with fat layer exposed I10 Essential (primary) hypertension N18.30 Chronic kidney disease, stage 3 unspecified I50.42 Chronic combined systolic (congestive) and diastolic (congestive) heart failure I25.10 Atherosclerotic heart disease of native coronary artery without angina pectoris Wound Treatment Electronic Signature(s) Signed: 04/17/2021 11:39:14 AM By: Kalman Shan DO Entered By: Kalman Shan on 04/17/2021 11:38:48 [name] ([id number]) -------------------------------------------------------------------------------- Problem List Details Patient Name: [name] Date of Service: 04/17/2021 11:00 AM Medical Record Number: [id number] Patient Account Number: [id number] Date of Birth/Sex: [birth year] (76 y.o. M) Treating RN: [name] Primary Care Provider: Tommi Rumps Other  Clinician: Referring Provider: Tommi Rumps Treating Provider/Extender: Yaakov Guthrie in Treatment: 3 Active Problems ICD-10 Encounter Code Description Active Date MDM Diagnosis E11.622 Type 2 diabetes mellitus with other skin ulcer 03/27/2021 No Yes I87.2 Venous insufficiency (chronic) (peripheral) 03/27/2021 No Yes L97.822 Non-pressure chronic ulcer of other part of left lower leg with fat layer 03/27/2021 No Yes exposed [place] (primary) hypertension 03/27/2021 No Yes N18.30 Chronic kidney disease, stage 3 unspecified 03/27/2021 No Yes I50.42 Chronic combined systolic (congestive) and diastolic (congestive) heart 03/27/2021 No Yes failure I25.10 Atherosclerotic heart disease of native coronary artery without angina 03/27/2021 No Yes pectoris Inactive Problems Resolved Problems Electronic Signature(s) Signed: 04/17/2021 11:39:14 AM By: Kalman Shan DO Entered By: Kalman Shan on 04/17/2021 11:34:05 [name] ([id number]) -------------------------------------------------------------------------------- Progress Note Details Patient Name: [name] Date of Service: 04/17/2021 11:00 AM Medical Record Number: [id number] Patient Account Number: [id number] Date of Birth/Sex: 1945/02/22 (76 y.o. M) Treating RN: [name] Primary Care Provider: Tommi Rumps Other Clinician: Referring Provider: Tommi Rumps Treating Provider/Extender: Yaakov Guthrie in Treatment: 3 Subjective Chief Complaint Information obtained from Patient Left leg ulcer History of Present Illness (HPI) 03/27/2021 upon evaluation today patient appears to be doing well with regard to his wound. This was actually an issue that he has had since November 2022. He does have evidence of leg swelling and even early signs of lymphedema. Subsequently he tells me that his daughter has been putting some "green-cream" on this. I am not sure what that is but nonetheless he does  seem to have a lot of healing compared to what he had previous. Fortunately there does not appear to be any signs of active infection locally nor systemically at this time which is great news. Honestly as dry as his leg is and even the wound bed I think that Xeroform gauze would probably be an awesome option for him to try to help with getting this area to heal appropriately. Patient does have a history of diabetes mellitus type 2, chronic venous insufficiency, hypertension, chronic kidney disease stage III, congestive heart failure, and coronary artery disease. 04/03/2021 upon evaluation today patient's legs actually appear to be doing great his left leg where the wounds are is actually measuring a bit smaller and the wounds themselves are definitely smaller. I am actually extremely pleased with where  we stand today and I see no signs of active infection locally or systemically at this time which is great news. No fevers, chills, nausea, vomiting, or diarrhea. 04/10/2021 upon evaluation today patient's wounds are actually showing signs of improvement both anterior and posterior. I think that he is making excellent progress and overall I see no need for any major changes here at this point. 3/13; patient presents for follow-up. He has no issues or complaints today. He reports developing shingles to his chest with some open draining vesicles. He does have some scabbed over lesions. He saw his primary care provider for this issue. He was started on medication. His daughter has been doing the dressing changing to his left leg. He has no issues or complaints about the leg wound. Objective Constitutional Vitals Time Taken: 11:17 AM, Weight: 183 lbs, Temperature: 97.7 F, Pulse: 67 bpm, Respiratory Rate: 18 breaths/min, Blood Pressure: 100/67 mmHg. General Notes: Left lower extremity: To the distal aspect there are 2 open wounds with granulation tissue throughout. Good edema control. No surrounding signs  of infection. Integumentary (Hair, Skin) Wound #1 status is Open. Original cause of wound was Gradually Appeared. The date acquired was: 12/28/2020. The wound has been in treatment 3 weeks. The wound is located on the Left,Anterior Lower Leg. The wound measures 2.5cm length x 2.5cm width x 0.1cm depth; 4.909cm^2 area and 0.491cm^3 volume. There is Fat Layer (Subcutaneous Tissue) exposed. There is no tunneling or undermining noted. There is a medium amount of serosanguineous drainage noted. There is large (67-100%) pink, pale, hyper - granulation within the wound bed. There is a small (1-33%) amount of necrotic tissue within the wound bed including Adherent Slough. Wound #2 status is Open. Original cause of wound was Gradually Appeared. The date acquired was: 12/28/2020. The wound has been in treatment 3 weeks. The wound is located on the Left,Posterior Lower Leg. The wound measures 2.5cm length x 2.5cm width x 0.1cm depth; 4.909cm^2 area and 0.491cm^3 volume. There is Fat Layer (Subcutaneous Tissue) exposed. There is no tunneling or undermining noted. There is a medium amount of serosanguineous drainage noted. There is large (67-100%) red, pink, pale granulation within the wound bed. There is a small (1-33%) amount of necrotic tissue within the wound bed including Adherent Slough. JOYCE, LECKEY ([id number]) Assessment Active Problems ICD-10 Type 2 diabetes mellitus with other skin ulcer Venous insufficiency (chronic) (peripheral) Non-pressure chronic ulcer of other part of left lower leg with fat layer exposed Essential (primary) hypertension Chronic kidney disease, stage 3 unspecified Chronic combined systolic (congestive) and diastolic (congestive) heart failure Atherosclerotic heart disease of native coronary artery without angina pectoris Patient's wounds To the left leg appear well-healing. I recommended continue with current therapy of Xeroform and Tubigrip. Follow-up in  1 week. Plan 1. Xeroform and Tubigrip 2. Follow-up in 1 week Electronic Signature(s) Signed: 04/17/2021 11:39:14 AM By: Kalman Shan DO Entered By: Kalman Shan on 04/17/2021 11:38:01 [name] ([id number]) -------------------------------------------------------------------------------- [place] Details Patient Name: [name] Date of Service: 04/17/2021 Medical Record Number: [id number] Patient Account Number: [id number] Date of Birth/Sex: [birth year] (76 y.o. M) Treating RN: [name] Primary Care Provider: Tommi Rumps Other Clinician: Referring Provider: Tommi Rumps Treating Provider/Extender: Yaakov Guthrie in Treatment: 3 Diagnosis Coding ICD-10 Codes Code Description [phone] Type 2 diabetes mellitus with other skin ulcer I87.2 Venous insufficiency (chronic) (peripheral) L97.822 Non-pressure chronic ulcer of other part of left lower leg with fat layer exposed I10 Essential (primary) hypertension N18.30 Chronic kidney disease, stage  3 unspecified I50.42 Chronic combined systolic (congestive) and diastolic (congestive) heart failure I25.10 Atherosclerotic heart disease of native coronary artery without angina pectoris Physician Procedures CPT4 Code: [id number] Description: 99213 - WC PHYS LEVEL 3 - EST PT Modifier: Quantity: 1 CPT4 Code: Description: ICD-10 Diagnosis Description L97.822 Non-pressure chronic ulcer of other part of left lower leg with fat lay E11.622 Type 2 diabetes mellitus with other skin ulcer I87.2 Venous insufficiency (chronic) (peripheral) Modifier: er exposed Quantity: Electronic Signature(s) Signed: 04/17/2021 11:39:14 AM By: Kalman Shan DO Entered By: Kalman Shan on 04/17/2021 11:38:26

## 2021-04-17 NOTE — Progress Notes (Signed)
CODEE, TUTSON ([id number]) Visit Report for 04/17/2021 Arrival Information Details Patient Name: [name], [name] Date of Service: 04/17/2021 11:00 AM Medical Record Number: [id number] Patient Account Number: [id number] Date of Birth/Sex: Apr 26, 1945 (75 y.o. M) Treating RN: Carlene Coria Primary Care Stephnie Parlier: Tommi Rumps Other Clinician: Referring Jewels Langone: Tommi Rumps Treating Aubrionna Istre/Extender: Yaakov Guthrie in Treatment: 3 Visit Information History Since Last Visit All ordered tests and consults were completed: No Patient Arrived: Ambulatory Added or deleted any medications: No Arrival Time: 11:11 Any new allergies or adverse reactions: No Accompanied By: self Had a fall or experienced change in No Transfer Assistance: None activities of daily living that may affect Patient Identification Verified: Yes risk of falls: Secondary Verification Process Completed: Yes Signs or symptoms of abuse/neglect since last visito No Patient Requires Transmission-Based Precautions: No Hospitalized since last visit: No Patient Has Alerts: No Implantable device outside of the clinic excluding No cellular tissue based products placed in the center since last visit: Has Dressing in Place as Prescribed: Yes Has Compression in Place as Prescribed: Yes Pain Present Now: No Electronic Signature(s) Signed: 04/17/2021 4:21:53 PM By: Carlene Coria RN Entered By: Carlene Coria on 04/17/2021 11:16:00 [name] ([id number]) -------------------------------------------------------------------------------- Clinic Level of Care Assessment Details Patient Name: [name] Date of Service: 04/17/2021 11:00 AM Medical Record Number: [id number] Patient Account Number: [id number] Date of Birth/Sex: 13-Sep-1945 (75 y.o. M) Treating RN: Carlene Coria Primary Care Brittnay Pigman: Tommi Rumps Other Clinician: Referring Yoselin Amerman: Tommi Rumps Treating Kanita Delage/Extender: Yaakov Guthrie in Treatment: 3 Clinic Level of Care Assessment Items TOOL 4 Quantity Score X - Use when only an EandM is performed on FOLLOW-UP visit 1 0 ASSESSMENTS - Nursing Assessment / Reassessment '[]'$  - Reassessment of Co-morbidities (includes updates in patient status) 0 '[]'$  - 0 Reassessment of Adherence to Treatment Plan ASSESSMENTS - Wound and Skin Assessment / Reassessment '[]'$  - Simple Wound Assessment / Reassessment - one wound 0 X- 2 5 Complex Wound Assessment / Reassessment - multiple wounds '[]'$  - 0 Dermatologic / Skin Assessment (not related to wound area) ASSESSMENTS - Focused Assessment '[]'$  - Circumferential Edema Measurements - multi extremities 0 '[]'$  - 0 Nutritional Assessment / Counseling / Intervention '[]'$  - 0 Lower Extremity Assessment (monofilament, tuning fork, pulses) '[]'$  - 0 Peripheral Arterial Disease Assessment (using hand held doppler) ASSESSMENTS - Ostomy and/or Continence Assessment and Care '[]'$  - Incontinence Assessment and Management 0 '[]'$  - 0 Ostomy Care Assessment and Management (repouching, etc.) PROCESS - Coordination of Care X - Simple Patient / Family Education for ongoing care 1 15 '[]'$  - 0 Complex (extensive) Patient / Family Education for ongoing care X- 1 10 Staff obtains Programmer, systems, Records, Test Results / Process Orders '[]'$  - 0 Staff telephones HHA, Nursing Homes / Clarify orders / etc '[]'$  - 0 Routine Transfer to another Facility (non-emergent condition) '[]'$  - 0 Routine Hospital Admission (non-emergent condition) '[]'$  - 0 New Admissions / Biomedical engineer / Ordering NPWT, Apligraf, etc. '[]'$  - 0 Emergency Hospital Admission (emergent condition) X- 1 10 Simple Discharge Coordination '[]'$  - 0 Complex (extensive) Discharge Coordination PROCESS - Special Needs '[]'$  - Pediatric / Minor Patient Management 0 '[]'$  - 0 Isolation Patient Management '[]'$  - 0 Hearing / Language / Visual special needs '[]'$  - 0 Assessment of Community assistance  (transportation, D/C planning, etc.) '[]'$  - 0 Additional assistance / Altered mentation '[]'$  - 0 Support Surface(s) Assessment (bed, cushion, seat, etc.) INTERVENTIONS - Wound Cleansing / Measurement [name], [name] ([id number]) '[]'$  -  0 Simple Wound Cleansing - one wound X- 2 5 Complex Wound Cleansing - multiple wounds X- 1 5 Wound Imaging (photographs - any number of wounds) '[]'$  - 0 Wound Tracing (instead of photographs) '[]'$  - 0 Simple Wound Measurement - one wound X- 2 5 Complex Wound Measurement - multiple wounds INTERVENTIONS - Wound Dressings X - Small Wound Dressing one or multiple wounds 2 10 '[]'$  - 0 Medium Wound Dressing one or multiple wounds '[]'$  - 0 Large Wound Dressing one or multiple wounds '[]'$  - 0 Application of Medications - topical '[]'$  - 0 Application of Medications - injection INTERVENTIONS - Miscellaneous '[]'$  - External ear exam 0 '[]'$  - 0 Specimen Collection (cultures, biopsies, blood, body fluids, etc.) '[]'$  - 0 Specimen(s) / Culture(s) sent or taken to Lab for analysis '[]'$  - 0 Patient Transfer (multiple staff / Civil Service fast streamer / Similar devices) '[]'$  - 0 Simple Staple / Suture removal (25 or less) '[]'$  - 0 Complex Staple / Suture removal (26 or more) '[]'$  - 0 Hypo / Hyperglycemic Management (close monitor of Blood Glucose) '[]'$  - 0 Ankle / Brachial Index (ABI) - do not check if billed separately X- 1 5 Vital Signs Has the patient been seen at the hospital within the last three years: Yes Total Score: 95 Level Of Care: New/Established - Level 3 Electronic Signature(s) Signed: 04/17/2021 4:21:53 PM By: Carlene Coria RN Entered By: Carlene Coria on 04/17/2021 16:20:48 [name] ([id number]) -------------------------------------------------------------------------------- Encounter Discharge Information Details Patient Name: [name] Date of Service: 04/17/2021 11:00 AM Medical Record Number: [id number] Patient Account Number: [id number] Date of Birth/Sex:  September 25, 1945 (75 y.o. M) Treating RN: Carlene Coria Primary Care Shevelle Smither: Tommi Rumps Other Clinician: Referring Hayven Fatima: Tommi Rumps Treating Adreanne Yono/Extender: Yaakov Guthrie in Treatment: 3 Encounter Discharge Information Items Discharge Condition: Stable Ambulatory Status: Ambulatory Discharge Destination: Home Transportation: Private Auto Accompanied By: self Schedule Follow-up Appointment: Yes Clinical Summary of Care: Patient Declined Electronic Signature(s) Signed: 04/17/2021 4:19:25 PM By: Carlene Coria RN Entered By: Carlene Coria on 04/17/2021 16:19:25 [name] ([id number]) -------------------------------------------------------------------------------- Multi Wound Chart Details Patient Name: [name] Date of Service: 04/17/2021 11:00 AM Medical Record Number: [id number] Patient Account Number: [id number] Date of Birth/Sex: 10/03/1945 (75 y.o. M) Treating RN: Carlene Coria Primary Care Arli Bree: Tommi Rumps Other Clinician: Referring Janson Lamar: Tommi Rumps Treating Peyson Postema/Extender: Yaakov Guthrie in Treatment: 3 Vital Signs Height(in): Pulse(bpm): 60 Weight(lbs): 183 Blood Pressure(mmHg): 100/67 Body Mass Index(BMI): Temperature(F): 97.7 Respiratory Rate(breaths/min): 18 Photos: [2:No Photos] [N/A:N/A] Wound Location: Left, Anterior Lower Leg Left, Posterior Lower Leg N/A Wounding Event: Gradually Appeared Gradually Appeared N/A Primary Etiology: Diabetic Wound/Ulcer of the Lower Diabetic Wound/Ulcer of the Lower N/A Extremity Extremity Comorbid History: Coronary Artery Disease, Coronary Artery Disease, N/A Hypertension, Type II Diabetes Hypertension, Type II Diabetes Date Acquired: 12/28/2020 12/28/2020 N/A Weeks of Treatment: 3 3 N/A Wound Status: Open Open N/A Wound Recurrence: No No N/A Clustered Wound: Yes No N/A Measurements L x W x D (cm) 2.5x2.5x0.1 2.5x2.5x0.1 N/A Area (cm) : 4.909 4.909 N/A Volume  (cm) : 0.491 0.491 N/A % Reduction in Area: 82.10% 40.50% N/A % Reduction in Volume: 82.10% 40.50% N/A Classification: Grade 2 Grade 2 N/A Exudate Amount: Medium Medium N/A Exudate Type: Serosanguineous Serosanguineous N/A Exudate Color: red, brown red, brown N/A Granulation Amount: Large (67-100%) Large (67-100%) N/A Granulation Quality: Pink, Pale, Hyper-granulation Red, Pink, Pale N/A Necrotic Amount: Small (1-33%) Small (1-33%) N/A Exposed Structures: Fat Layer (Subcutaneous Tissue): Fat Layer (Subcutaneous Tissue): N/A  Yes Yes Fascia: No Fascia: No Tendon: No Tendon: No Muscle: No Muscle: No Joint: No Joint: No Bone: No Bone: No Epithelialization: Medium (34-66%) Small (1-33%) N/A Treatment Notes Electronic Signature(s) Signed: 04/17/2021 4:06:48 PM By: Carlene Coria RN Entered By: Carlene Coria on 04/17/2021 16:06:48 [name] ([id number]) -------------------------------------------------------------------------------- Multi-Disciplinary Care Plan Details Patient Name: [name] Date of Service: 04/17/2021 11:00 AM Medical Record Number: [id number] Patient Account Number: [id number] Date of Birth/Sex: 17-May-1945 (75 y.o. M) Treating RN: Carlene Coria Primary Care Dajon Rowe: Tommi Rumps Other Clinician: Referring Kristoff Coonradt: Tommi Rumps Treating Shonica Weier/Extender: Yaakov Guthrie in Treatment: 3 Active Inactive Wound/Skin Impairment Nursing Diagnoses: Knowledge deficit related to ulceration/compromised skin integrity Goals: Patient/caregiver will verbalize understanding of skin care regimen Date Initiated: 03/27/2021 Target Resolution Date: 04/24/2021 Goal Status: Active Ulcer/skin breakdown will have a volume reduction of 30% by week 4 Date Initiated: 03/27/2021 Target Resolution Date: 05/25/2021 Goal Status: Active Ulcer/skin breakdown will have a volume reduction of 50% by week 8 Date Initiated: 03/27/2021 Target Resolution Date:  06/24/2021 Goal Status: Active Ulcer/skin breakdown will have a volume reduction of 80% by week 12 Date Initiated: 03/27/2021 Target Resolution Date: 07/25/2021 Goal Status: Active Ulcer/skin breakdown will heal within 14 weeks Date Initiated: 03/27/2021 Target Resolution Date: 08/24/2021 Goal Status: Active Interventions: Assess patient/caregiver ability to obtain necessary supplies Assess patient/caregiver ability to perform ulcer/skin care regimen upon admission and as needed Assess ulceration(s) every visit Notes: Electronic Signature(s) Signed: 04/17/2021 4:06:14 PM By: Carlene Coria RN Entered By: Carlene Coria on 04/17/2021 [name], Christopher J. ([id number]) -------------------------------------------------------------------------------- Pain Assessment Details Patient Name: [name] Date of Service: 04/17/2021 11:00 AM Medical Record Number: [id number] Patient Account Number: [id number] Date of Birth/Sex: 10/06/1945 (75 y.o. M) Treating RN: Carlene Coria Primary Care Delesia Martinek: Tommi Rumps Other Clinician: Referring Toshiye Kever: Tommi Rumps Treating Nicodemus Denk/Extender: Yaakov Guthrie in Treatment: 3 Active Problems Location of Pain Severity and Description of Pain Patient Has Paino No Site Locations Pain Management and Medication Current Pain Management: Electronic Signature(s) Signed: 04/17/2021 4:21:53 PM By: Carlene Coria RN Entered By: Carlene Coria on 04/17/2021 11:17:38 [name] ([id number]) -------------------------------------------------------------------------------- Patient/Caregiver Education Details Patient Name: [name] Date of Service: 04/17/2021 11:00 AM Medical Record Number: [id number] Patient Account Number: [id number] Date of Birth/Gender: Dec 02, 1945 (75 y.o. M) Treating RN: Carlene Coria Primary Care Physician: Tommi Rumps Other Clinician: Referring Physician: Tommi Rumps Treating Physician/Extender: Yaakov Guthrie in Treatment: 3 Education Assessment Education Provided To: Patient Education Topics Provided Wound/Skin Impairment: Methods: Explain/Verbal Responses: State content correctly Electronic Signature(s) Signed: 04/17/2021 4:21:53 PM By: Carlene Coria RN Entered By: Carlene Coria on 04/17/2021 16:11:54 [name] ([id number]) -------------------------------------------------------------------------------- Wound Assessment Details Patient Name: [name] Date of Service: 04/17/2021 11:00 AM Medical Record Number: [id number] Patient Account Number: [id number] Date of Birth/Sex: 1945-06-02 (75 y.o. M) Treating RN: Carlene Coria Primary Care Daaiyah Baumert: Tommi Rumps Other Clinician: Referring Reyan Helle: Tommi Rumps Treating Chany Woolworth/Extender: Yaakov Guthrie in Treatment: 3 Wound Status Wound Number: 1 Primary Etiology: Diabetic Wound/Ulcer of the Lower Extremity Wound Location: Left, Anterior Lower Leg Wound Status: Open Wounding Event: Gradually Appeared Comorbid Coronary Artery Disease, Hypertension, Type II History: Diabetes Date Acquired: 12/28/2020 Weeks Of Treatment: 3 Clustered Wound: Yes Photos Wound Measurements Length: (cm) 2.5 Width: (cm) 2.5 Depth: (cm) 0.1 Area: (cm) 4.909 Volume: (cm) 0.491 % Reduction in Area: 82.1% % Reduction in Volume: 82.1% Epithelialization: Medium (34-66%) Tunneling: No Undermining: No Wound Description Classification: Grade 2 Exudate Amount: Medium Exudate Type:  Serosanguineous Exudate Color: red, brown Foul Odor After Cleansing: No Slough/Fibrino Yes Wound Bed Granulation Amount: Large (67-100%) Exposed Structure Granulation Quality: Pink, Pale, Hyper-granulation Fascia Exposed: No Necrotic Amount: Small (1-33%) Fat Layer (Subcutaneous Tissue) Exposed: Yes Necrotic Quality: Adherent Slough Tendon Exposed: No Muscle Exposed: No Joint Exposed: No Bone Exposed: No Treatment Notes Wound #1  (Lower Leg) Wound Laterality: Left, Anterior Cleanser Soap and Water Discharge Instruction: Gently cleanse wound with antibacterial soap, rinse and pat dry prior to dressing wounds Peri-Wound Care [name], [name] ([id number]) Topical Primary Dressing Xeroform-HBD 2x2 (in/in) Discharge Instruction: Apply Xeroform-HBD 2x2 (in/in) as directed Secondary Dressing ABD Pad 5x9 (in/in) Discharge Instruction: Cover with ABD pad Secured With Medipore Tape - 18M Medipore H Soft Cloth Surgical Tape, 2x2 (in/yd) Compression Wrap Compression Stockings Add-Ons Electronic Signature(s) Signed: 04/17/2021 4:21:53 PM By: Carlene Coria RN Entered By: Carlene Coria on 04/17/2021 11:34:28 [name] ([id number]) -------------------------------------------------------------------------------- Wound Assessment Details Patient Name: [name] Date of Service: 04/17/2021 11:00 AM Medical Record Number: [id number] Patient Account Number: [id number] Date of Birth/Sex: 1945-02-11 (75 y.o. M) Treating RN: Carlene Coria Primary Care Orian Amberg: Tommi Rumps Other Clinician: Referring Ashur Glatfelter: Tommi Rumps Treating Kristoff Coonradt/Extender: Yaakov Guthrie in Treatment: 3 Wound Status Wound Number: 2 Primary Etiology: Diabetic Wound/Ulcer of the Lower Extremity Wound Location: Left, Posterior Lower Leg Wound Status: Open Wounding Event: Gradually Appeared Comorbid Coronary Artery Disease, Hypertension, Type II History: Diabetes Date Acquired: 12/28/2020 Weeks Of Treatment: 3 Clustered Wound: No Wound Measurements Length: (cm) 2.5 Width: (cm) 2.5 Depth: (cm) 0.1 Area: (cm) 4.909 Volume: (cm) 0.491 % Reduction in Area: 40.5% % Reduction in Volume: 40.5% Epithelialization: Small (1-33%) Tunneling: No Undermining: No Wound Description Classification: Grade 2 Exudate Amount: Medium Exudate Type: Serosanguineous Exudate Color: red, brown Foul Odor After Cleansing: No Slough/Fibrino  Yes Wound Bed Granulation Amount: Large (67-100%) Exposed Structure Granulation Quality: Red, Pink, Pale Fascia Exposed: No Necrotic Amount: Small (1-33%) Fat Layer (Subcutaneous Tissue) Exposed: Yes Necrotic Quality: Adherent Slough Tendon Exposed: No Muscle Exposed: No Joint Exposed: No Bone Exposed: No Treatment Notes Wound #2 (Lower Leg) Wound Laterality: Left, Posterior Cleanser Soap and Water Discharge Instruction: Gently cleanse wound with antibacterial soap, rinse and pat dry prior to dressing wounds Peri-Wound Care Topical Primary Dressing Xeroform-HBD 2x2 (in/in) Discharge Instruction: Apply Xeroform-HBD 2x2 (in/in) as directed Secondary Dressing ABD Pad 5x9 (in/in) Discharge Instruction: Cover with ABD pad Secured With Medipore Tape - 18M Medipore H Soft Cloth Surgical Tape, 2x2 (in/yd) Compression Wrap Compression Stockings [name], [name] ([id number]) Add-Ons Electronic Signature(s) Signed: 04/17/2021 4:21:53 PM By: Carlene Coria RN Entered By: Carlene Coria on 04/17/2021 11:34:50 [name] ([id number]) -------------------------------------------------------------------------------- Vitals Details Patient Name: [name] Date of Service: 04/17/2021 11:00 AM Medical Record Number: [id number] Patient Account Number: [id number] Date of Birth/Sex: February 11, 1945 (76 y.o. M) Treating RN: Carlene Coria Primary Care Ollen Rao: Tommi Rumps Other Clinician: Referring Lailie Smead: Tommi Rumps Treating Micheil Klaus/Extender: Yaakov Guthrie in Treatment: 3 Vital Signs Time Taken: 11:17 Temperature (F): 97.7 Weight (lbs): 183 Pulse (bpm): 67 Respiratory Rate (breaths/min): 18 Blood Pressure (mmHg): 100/67 Reference Range: 80 - 120 mg / dl Electronic Signature(s) Signed: 04/17/2021 4:21:53 PM By: Carlene Coria RN Entered By: Carlene Coria on 04/17/2021 11:17:14

## 2021-04-17 NOTE — Progress Notes (Signed)
Patient: [name].  DOB:  August 22, 2045   MRN:  [id number]  [name]. Is a 76 yo male patient with a PMHx of hypertension, CHF, CAD, diabetes, diabetic neuropathy, colon cancer, and  CKD IIIa.   Echo report from 09/09/19 reviewed and showed an EF of 25-30% with severely decreased LV function and global hypokinesis  Presented to the ED on 04/14/21 with concerns of shingles and worsening SOB. BNP > 4,500, CXR noted with pleural effusions. Diuresed with IV lasix and planned to admit, however patient ultimately refused admission and decided to go home.  He presents today for his initial visit to the heart failure clinic with a c/c of SOB and fatigue. He reports this as a "7" and states it is his baseline. Along with this he has occasional pedal edema. He denies CP, palpitations, dizziness, cough, weight gain, or orthopnea.   Has shingles, vesicles present over RU chest.   Past Medical History:  Diagnosis Date   Adenocarcinoma of colon ([place]) 06/19/2013   a.) moderately differentiated stage IIIc (T4bN2a M0) adenocarcinoma of colon  s/p colectomy followed by chemoRx with oxaliplatin & fluorouacil.   Anemia    CAD (coronary artery disease)    a. 02/2015 Cath: LM nl, LAD 50/40 mid/distal, LCX 80p, RCA 40p, 30d, RPL1 40, CO 3.27, CI 1.65; b. cath 06/09/15: LM minor irregs, m-dLAD 50%, dLAD 40%, pLCx 80% s/p PCI/DES 0%, pRCA 40%, dRCA 30%, 1st RPLB 40%   Chronic kidney disease, stage 3a (HCC)    Chronic systolic CHF (congestive heart failure) ([place])    a. 02/2015 Echo: EF 20-25%, diff HK, mod MR, mildy dil LA, mildly dil RV w/ mod RV syst dysfxn, mildly dil RA, mod TR, PASP 1mHg; b. 09/2017 Echo: EF 40-45%, diff HK. Gr1 DD. Mild to mod MR. Mildly to mod dil LA.   Dia hypertension 07/20/2014   Diabetes mellitus without complication (HCC)    Hx of heart artery stent 06/09/2015   3.0 x 12 mm Xience Alpine DES x 1 to LCx   Hypertension    Hypertensive heart disease    Left inguinal hernia 05/2020    Mixed Ischemic and Non-ischemic Cardiomyopathy    a.) 02/2015 EF 20-25%, diff HK; b.) 09/2016 EF 40-45%; c.) 09/2019 EF 25-30%   Moderate mitral regurgitation    a. 09/2017 Echo: Mild to mod TR.   Pulmonary hypertension (HCC)    Tubular adenoma of colon    Past Surgical History:  Procedure Laterality Date   APPENDECTOMY N/A 06/19/2017   Location: [facility]; Surgeon: WConsuela Mimes MD   CARDIAC CATHETERIZATION Bilateral 02/24/2015   Procedure: Right/Left Heart Cath and Coronary Angiography;  Surgeon: MWellington Hampshire MD;  [facility];  Service: Cardiovascular;  Laterality: Bilateral;   CARDIAC CATHETERIZATION N/A 06/09/2015   Procedure: Coronary Stent Intervention (3.0 x 12 mm Xience Alpine DES to LCx);  Surgeon: MWellington Hampshire MD;  [facility];  Service: Cardiovascular;  Laterality: N/A   COLECTOMY  06/19/2017   Descending and proximal sigmoid colectomy, LEFT ureterolysis, partial cecectomy, appendectomy; [facility] Surgeon: WConsuela Mimes MD   COLONOSCOPY     COLONOSCOPY WITH PROPOFOL N/A 12/11/2016   Procedure: COLONOSCOPY WITH PROPOFOL;  Surgeon: SLollie Sails MD;  [facility];  Service: Endoscopy;  Laterality: N/A;   ESOPHAGOGASTRODUODENOSCOPY     PORT-A-CATH REMOVAL N/A 07/12/2014   Procedure: REMOVAL PORT-A-CATH;  Surgeon: WMolly Maduro MD;  Location: [facility];  Service: General;  Laterality: N/A;   PORTACATH PLACEMENT Left 2015   Family History  Problem Relation Age of Onset   Cancer Mother    Social History   Tobacco Use   Smoking status: Never   Smokeless tobacco: Never  Substance Use Topics   Alcohol use: No   Allergies  Allergen Reactions   Shellfish Allergy Other (See Comments)    Pt. instructed by MD to avoid seafood   Prior to Admission medications   Medication Sig Start Date End Date Taking? Authorizing Provider  amLODipine (NORVASC) 2.5 MG tablet Take by mouth. 10/25/20 10/25/21   [provider]  aspirin EC 81 MG tablet Take 1 tablet (81 mg total) by mouth daily. 04/14/15   Theora Gianotti, NP  atorvastatin (LIPITOR) 40 MG tablet TAKE 1 TABLET BY MOUTH EVERY DAY 03/11/20   Wellington Hampshire, MD  carvedilol (COREG) 3.125 MG tablet TAKE 1 TABLET(3.125 MG) BY MOUTH TWICE DAILY 03/11/20   Wellington Hampshire, MD  cholecalciferol (VITAMIN D3) 25 MCG (1000 UNIT) tablet Take 1,000 Units by mouth daily.    [provider]  docusate sodium (COLACE) 100 MG capsule Take 1 capsule (100 mg total) by mouth daily as needed for mild constipation. 11/29/20   Earlie Server, MD  ENTRESTO 24-26 MG TAKE 1 TABLET BY MOUTH TWICE DAILY 09/08/20   Wellington Hampshire, MD  ferrous sulfate 325 (65 FE) MG EC tablet Take 1 tablet (325 mg total) by mouth 2 (two) times daily with a meal. 11/29/20   Earlie Server, MD  furosemide (LASIX) 20 MG tablet Take 1 tablet (20 mg total) by mouth daily. 08/26/20   Wellington Hampshire, MD  gabapentin (NEURONTIN) 100 MG capsule Take 100 mg by mouth daily in the afternoon. 02/29/20   [provider]  HYDROcodone-acetaminophen (NORCO/VICODIN) 5-325 MG tablet Take 1 tablet by mouth every 6 (six) hours as needed for moderate pain. 09/07/20   Ronny Bacon, MD  ibuprofen (ADVIL) 800 MG tablet Take 1 tablet (800 mg total) by mouth every 8 (eight) hours as needed. 09/07/20   Ronny Bacon, MD  Lidocaine 4 % GEL Apply 1 application. topically 4 (four) times daily as needed. 04/14/21   Triplett, Johnette Abraham B, FNP  metFORMIN (GLUCOPHAGE) 500 MG tablet TAKE 1 TABLET(500 MG) BY MOUTH TWICE DAILY WITH A MEAL 04/03/21   Leone Haven, MD  spironolactone (ALDACTONE) 25 MG tablet TAKE 1 TABLET(25 MG) BY MOUTH DAILY 08/10/20   Wellington Hampshire, MD  tamsulosin (FLOMAX) 0.4 MG CAPS capsule TAKE 1 CAPSULE(0.4 MG) BY MOUTH DAILY AFTER SUPPER Patient taking differently: Take 0.4 mg by mouth daily after breakfast. 04/14/19   McGowan, Larene Beach A, PA-C  valACYclovir (VALTREX) 1000 MG  tablet Take 1 tablet (1,000 mg total) by mouth 2 (two) times daily. 04/14/21   Victorino Dike, FNP    Review of Systems  Constitutional:  Positive for malaise/fatigue.  HENT:  Positive for hearing loss.   Eyes: Negative.   Respiratory:  Positive for shortness of breath.   Cardiovascular:  Positive for leg swelling. Negative for chest pain, palpitations and orthopnea.  Gastrointestinal: Negative.   Genitourinary: Negative.        Wearing adult incontinence   Musculoskeletal: Negative.  Negative for falls.  Skin:  Positive for rash (shingles over RU chest).  Neurological:  Negative for dizziness and weakness.  Endo/Heme/Allergies: Negative.   Psychiatric/Behavioral: Negative.       Physical Exam Vitals and nursing note reviewed. Exam conducted with a  chaperone present (wife).  Constitutional:      General: He is not in acute distress. HENT:     Mouth/Throat:     Comments: Missing teeth and poor dentition  Cardiovascular:     Rate and Rhythm: Normal rate and regular rhythm.     Heart sounds: Normal heart sounds. No murmur heard. Pulmonary:     Effort: Pulmonary effort is normal.     Breath sounds: No wheezing, rhonchi or rales.  Abdominal:     Palpations: Abdomen is soft.  Musculoskeletal:     Right lower leg: Edema (+1) present.     Left lower leg: Edema (wrapped to knee for wound) present.  Skin:    General: Skin is warm and dry.  Neurological:     General: No focal deficit present.     Mental Status: He is alert and oriented to person, place, and time.  Psychiatric:        Mood and Affect: Mood normal.    Lab Results  Component Value Date   CREATININE 1.70 (H) 04/14/2021   CREATININE 1.49 03/10/2021   CREATININE 1.28 (H) 11/01/2020    Vitals with BMI 04/14/2021 04/14/2021 04/14/2021  Height - - '5\' 9"'$   Weight - - 174 lbs 10 oz  BMI - - 21.11  Systolic 735 670 -  Diastolic 75 95 -  Pulse 58 95 -    Assessment and Plan:  Chronic Heart failure with reduced  ejection fraction- - NYHA II - euvolemic - instructed to weigh daily, educated to call with a weight gain 2-3lbs/night or 5 lbs/week  - weight today 184 - discussed adhering to low sodium diet, provided low sodium cookbook - discussed adhering to ~ 64 ounces of total fluid/day - will see cardiology Fletcher Anon) 04/20/21 - on GDMT entresto, coreg, and spironolactone - discussed SGLT2, pt not interested, wearing adult incontinence diaper  - discussed [facility] program, he is not interested today but agreed to take information flyer to consider - BNP 04/14/21 > 4,500  2. HTN- - BP today initially 150/136, rechecked 126/72 - norvasc, entresto, coreg - will see PCP Caryl Bis) 05/01/21 for hospital f/u - BMP reviewed from 04/14/21: Na 145, K 3.9, Cr 1.70, gfr 42  3. CAD - ASA, lipitor  4. DM with CKD- - AIC 03/10/21 6.5% - takes metformin - saw nephrology Rolly Salter) 01/23/21 ; suggested adding SGLT2 but pt was not interested  5. Shingles- - on valtrex - active, vesicles present RU chest   Return in 1 month, sooner if needed.  Medication list was brought and reviewed.

## 2021-04-18 ENCOUNTER — Encounter: Payer: Self-pay | Admitting: Family

## 2021-04-18 ENCOUNTER — Ambulatory Visit: Payer: PPO | Attending: Family | Admitting: Family

## 2021-04-18 VITALS — BP 126/72 | HR 85 | Resp 16 | Ht 67.0 in | Wt 184.2 lb

## 2021-04-18 DIAGNOSIS — Z85038 Personal history of other malignant neoplasm of large intestine: Secondary | ICD-10-CM | POA: Diagnosis not present

## 2021-04-18 DIAGNOSIS — E1122 Type 2 diabetes mellitus with diabetic chronic kidney disease: Secondary | ICD-10-CM | POA: Insufficient documentation

## 2021-04-18 DIAGNOSIS — I2510 Atherosclerotic heart disease of native coronary artery without angina pectoris: Secondary | ICD-10-CM | POA: Insufficient documentation

## 2021-04-18 DIAGNOSIS — I130 Hypertensive heart and chronic kidney disease with heart failure and stage 1 through stage 4 chronic kidney disease, or unspecified chronic kidney disease: Secondary | ICD-10-CM | POA: Diagnosis not present

## 2021-04-18 DIAGNOSIS — N1832 Chronic kidney disease, stage 3b: Secondary | ICD-10-CM | POA: Diagnosis not present

## 2021-04-18 DIAGNOSIS — Z79899 Other long term (current) drug therapy: Secondary | ICD-10-CM | POA: Insufficient documentation

## 2021-04-18 DIAGNOSIS — Z7984 Long term (current) use of oral hypoglycemic drugs: Secondary | ICD-10-CM | POA: Insufficient documentation

## 2021-04-18 DIAGNOSIS — Z955 Presence of coronary angioplasty implant and graft: Secondary | ICD-10-CM | POA: Insufficient documentation

## 2021-04-18 DIAGNOSIS — N1831 Chronic kidney disease, stage 3a: Secondary | ICD-10-CM | POA: Insufficient documentation

## 2021-04-18 DIAGNOSIS — E1140 Type 2 diabetes mellitus with diabetic neuropathy, unspecified: Secondary | ICD-10-CM | POA: Insufficient documentation

## 2021-04-18 DIAGNOSIS — I5022 Chronic systolic (congestive) heart failure: Secondary | ICD-10-CM | POA: Diagnosis not present

## 2021-04-18 DIAGNOSIS — B029 Zoster without complications: Secondary | ICD-10-CM | POA: Insufficient documentation

## 2021-04-18 DIAGNOSIS — I10 Essential (primary) hypertension: Secondary | ICD-10-CM | POA: Diagnosis not present

## 2021-04-18 NOTE — Patient Instructions (Signed)
Start to weigh daily. ? ?Call us if you have a weight gain of 3lbs/night or 5 lbs/week. ? ?Try to adhere to < 2,000 mg of salt/sodium per day. ? ?Try to adhere to ~ 64 ounces of TOTAL FLUID/day. ? ?Return to the heart failure clinic in 1 month.  ?

## 2021-04-20 ENCOUNTER — Encounter: Payer: PPO | Admitting: Cardiovascular Disease

## 2021-04-20 ENCOUNTER — Encounter: Payer: Self-pay | Admitting: Cardiovascular Disease

## 2021-04-20 ENCOUNTER — Other Ambulatory Visit: Payer: Self-pay

## 2021-04-20 NOTE — Progress Notes (Signed)
The patient's visit was rescheduled due to active shingles. ?This encounter was created in error - please disregard. ?

## 2021-04-24 ENCOUNTER — Other Ambulatory Visit: Payer: Self-pay

## 2021-04-24 ENCOUNTER — Other Ambulatory Visit: Payer: Self-pay | Admitting: *Deleted

## 2021-04-24 ENCOUNTER — Encounter: Payer: PPO | Admitting: Physician Assistant

## 2021-04-24 DIAGNOSIS — E11622 Type 2 diabetes mellitus with other skin ulcer: Secondary | ICD-10-CM | POA: Diagnosis not present

## 2021-04-24 DIAGNOSIS — N1831 Chronic kidney disease, stage 3a: Secondary | ICD-10-CM

## 2021-04-24 DIAGNOSIS — L97822 Non-pressure chronic ulcer of other part of left lower leg with fat layer exposed: Secondary | ICD-10-CM | POA: Diagnosis not present

## 2021-04-24 DIAGNOSIS — L97222 Non-pressure chronic ulcer of left calf with fat layer exposed: Secondary | ICD-10-CM | POA: Diagnosis not present

## 2021-04-24 NOTE — Progress Notes (Addendum)
PARIS, CHIRIBOGA ([id number]) ?Visit Report for 04/24/2021 ?Chief Complaint Document Details ?Patient Name: [name], [name]. ?Date of Service: 04/24/2021 2:45 PM ?Medical Record Number: [id number] ?Patient Account Number: [id number] ?Date of Birth/Sex: 1945-05-24 (76 y.o. M) ?Treating RN: Carlene Coria ?Primary Care Provider: Tommi Rumps Other Clinician: ?Referring Provider: Tommi Rumps ?Treating Provider/Extender: Jeri Cos ?Weeks in Treatment: 4 ?Information Obtained from: Patient ?Chief Complaint ?Left leg ulcer ?Electronic Signature(s) ?Signed: 04/24/2021 2:51:40 PM By: Worthy Keeler PA-C ?Entered By: Worthy Keeler on 04/24/2021 14:51:40 ?[name], [name] ([id number]) ?-------------------------------------------------------------------------------- ?HPI Details ?Patient Name: [name], [name]. ?Date of Service: 04/24/2021 2:45 PM ?Medical Record Number: [id number] ?Patient Account Number: [id number] ?Date of Birth/Sex: 1945-12-07 (76 y.o. M) ?Treating RN: Carlene Coria ?Primary Care Provider: Tommi Rumps Other Clinician: ?Referring Provider: Tommi Rumps ?Treating Provider/Extender: Jeri Cos ?Weeks in Treatment: 4 ?History of Present Illness ?HPI Description: 03/27/2021 upon evaluation today patient appears to be doing well with regard to his wound. This was actually an issue that he ?has had since November 2022. He does have evidence of leg swelling and even early signs of lymphedema. Subsequently he tells me that his ?daughter has been putting some "green-cream" on this. I am not sure what that is but nonetheless he does seem to have a lot of healing ?compared to what he had previous. Fortunately there does not appear to be any signs of active infection locally nor systemically at this time which ?is great news. Honestly as dry as his leg is and even the wound bed I think that Xeroform gauze would probably be an awesome option for him to ?try to help with getting this area to heal  appropriately. ?Patient does have a history of diabetes mellitus type 2, chronic venous insufficiency, hypertension, chronic kidney disease stage III, congestive ?heart failure, and coronary artery disease. ?04/03/2021 upon evaluation today patient's legs actually appear to be doing great his left leg where the wounds are is actually measuring a bit ?smaller and the wounds themselves are definitely smaller. I am actually extremely pleased with where we stand today and I see no signs of active ?infection locally or systemically at this time which is great news. No fevers, chills, nausea, vomiting, or diarrhea. ?04/10/2021 upon evaluation today patient's wounds are actually showing signs of improvement both anterior and posterior. I think that he is making ?excellent progress and overall I see no need for any major changes here at this point. ?3/13; patient presents for follow-up. He has no issues or complaints today. He reports developing shingles to his chest with some open draining ?vesicles. He does have some scabbed over lesions. He saw his primary care provider for this issue. He was started on medication. His daughter ?has been doing the dressing changing to his left leg. He has no issues or complaints about the leg wound. ?04/24/2021 upon evaluation today patient appears to be doing well with regard to his leg ulcers. Both are showing signs of improvement with new ?epithelial growth which is great news. Overall very pleased in that regard. ?Electronic Signature(s) ?Signed: 04/24/2021 3:11:58 PM By: Worthy Keeler PA-C ?Entered By: Worthy Keeler on 04/24/2021 15:11:58 ?[name], [name] ([id number]) ?-------------------------------------------------------------------------------- ?Physical Exam Details ?Patient Name: [name], [name]. ?Date of Service: 04/24/2021 2:45 PM ?Medical Record Number: [id number] ?Patient Account Number: [id number] ?Date of Birth/Sex: 1945-08-18 (76 y.o. M) ?Treating RN: Carlene Coria ?Primary  Care Provider: Tommi Rumps Other Clinician: ?Referring Provider: Tommi Rumps ?Treating Provider/Extender: Jeri Cos ?Weeks in Treatment: 4 ?Constitutional ?  Well-nourished and well-hydrated in no acute distress. ?Respiratory ?normal breathing without difficulty. ?Psychiatric ?this patient is able to make decisions and demonstrates good insight into disease process. Alert and Oriented x 3. pleasant and cooperative. ?Notes ?Patient's wounds again are showing signs of good epithelization and granulation currently and I do not see any evidence of active infection locally ?or systemically which is great news. Overall I believe that we are headed in the right direction. I Georgina Peer give it 1 more week with the Xeroform and ?if he still seeing good improvement that is excellent if not then I may switch over to a collagen dressing and see what this would do for him. ?Electronic Signature(s) ?Signed: 04/24/2021 3:12:24 PM By: Worthy Keeler PA-C ?Entered By: Worthy Keeler on 04/24/2021 15:12:24 ?[name], [name] ([id number]) ?-------------------------------------------------------------------------------- ?Physician Orders Details ?Patient Name: [name], [name]. ?Date of Service: 04/24/2021 2:45 PM ?Medical Record Number: [id number] ?Patient Account Number: [id number] ?Date of Birth/Sex: 1945-04-14 (76 y.o. M) ?Treating RN: Carlene Coria ?Primary Care Provider: Tommi Rumps Other Clinician: ?Referring Provider: Tommi Rumps ?Treating Provider/Extender: Jeri Cos ?Weeks in Treatment: 4 ?Verbal / Phone Orders: No ?Diagnosis Coding ?ICD-10 Coding ?Code Description ?E11.622 Type 2 diabetes mellitus with other skin ulcer ?I87.2 Venous insufficiency (chronic) (peripheral) ?K48.185 Non-pressure chronic ulcer of other part of left lower leg with fat layer exposed ?I10 Essential (primary) hypertension ?N18.30 Chronic kidney disease, stage 3 unspecified ?I50.42 Chronic combined systolic (congestive) and diastolic  (congestive) heart failure ?I25.10 Atherosclerotic heart disease of native coronary artery without angina pectoris ?Follow-up Appointments ?o Return Appointment in 1 week. ?Bathing/ Shower/ Hygiene ?o May shower; gently cleanse wound with antibacterial soap, rinse and pat dry prior to dressing wounds ?Edema Control - Lymphedema / Segmental Compressive Device / Other ?o Tubigrip single layer applied. - side D - left leg ?o Elevate, Exercise Daily and Avoid Standing for Long Periods of Time. ?o Elevate legs to the level of the heart and pump ankles as often as possible ?o Elevate leg(s) parallel to the floor when sitting. ?Wound Treatment ?Wound #1 - Lower Leg Wound Laterality: Left, Anterior ?Cleanser: Soap and Water 3 x Per Week/30 Days ?Discharge Instructions: Gently cleanse wound with antibacterial soap, rinse and pat dry prior to dressing wounds ?Primary Dressing: Xeroform-HBD 2x2 (in/in) 3 x Per Week/30 Days ?Discharge Instructions: Apply Xeroform-HBD 2x2 (in/in) as directed ?Secondary Dressing: ABD Pad 5x9 (in/in) 3 x Per Week/30 Days ?Discharge Instructions: Cover with ABD pad ?Secured With: Medipore Tape - 28M Medipore H Soft Cloth Surgical Tape, 2x2 (in/yd) 3 x Per Week/30 Days ?Wound #2 - Lower Leg Wound Laterality: Left, Posterior ?Cleanser: Soap and Water 3 x Per Week/30 Days ?Discharge Instructions: Gently cleanse wound with antibacterial soap, rinse and pat dry prior to dressing wounds ?Primary Dressing: Xeroform-HBD 2x2 (in/in) 3 x Per Week/30 Days ?Discharge Instructions: Apply Xeroform-HBD 2x2 (in/in) as directed ?Secondary Dressing: ABD Pad 5x9 (in/in) 3 x Per Week/30 Days ?Discharge Instructions: Cover with ABD pad ?Secured With: Medipore Tape - 28M Medipore H Soft Cloth Surgical Tape, 2x2 (in/yd) 3 x Per Week/30 Days ?Electronic Signature(s) ?Signed: 04/24/2021 3:57:12 PM By: Carlene Coria RN ?[name], [name] ([id number]) ?Signed: 04/24/2021 4:18:52 PM By: Worthy Keeler PA-C ?Entered By:  Carlene Coria on 04/24/2021 15:07:54 ?[name], [name] ([id number]) ?-------------------------------------------------------------------------------- ?Problem List Details ?Patient Name: [name], [name]. ?Date of Service: 04/24/2021 2:4

## 2021-04-24 NOTE — Progress Notes (Addendum)
LAIN, [name] ([id number]) ?Visit Report for 04/24/2021 ?Arrival Information Details ?Patient Name: [name], [name]. ?Date of Service: 04/24/2021 2:45 PM ?Medical Record Number: [id number] ?Patient Account Number: [id number] ?Date of Birth/Sex: May 27, 1945 (76 y.o. M) ?Treating RN: Carlene Coria ?Primary Care Liston Thum: Tommi Rumps Other Clinician: ?Referring Landry Kamath: Tommi Rumps ?Treating Kerra Guilfoil/Extender: Jeri Cos ?Weeks in Treatment: 4 ?Visit Information History Since Last Visit ?All ordered tests and consults were completed: No ?Patient Arrived: Ambulatory ?Added or deleted any medications: No ?Arrival Time: 14:47 ?Any new allergies or adverse reactions: No ?Accompanied By: self ?Had a fall or experienced change in No ?Transfer Assistance: None ?activities of daily living that may affect ?Patient Identification Verified: Yes ?risk of falls: ?Secondary Verification Process Completed: Yes ?Signs or symptoms of abuse/neglect since last visito No ?Patient Requires Transmission-Based Precautions: No ?Hospitalized since last visit: No ?Patient Has Alerts: No ?Implantable device outside of the clinic excluding No ?cellular tissue based products placed in the center ?since last visit: ?Has Dressing in Place as Prescribed: Yes ?Has Compression in Place as Prescribed: Yes ?Pain Present Now: No ?Electronic Signature(s) ?Signed: 04/24/2021 3:57:12 PM By: Carlene Coria RN ?Entered By: Carlene Coria on 04/24/2021 14:56:31 ?[name], [name] ([id number]) ?-------------------------------------------------------------------------------- ?Clinic Level of Care Assessment Details ?Patient Name: [name], [name]. ?Date of Service: 04/24/2021 2:45 PM ?Medical Record Number: [id number] ?Patient Account Number: [id number] ?Date of Birth/Sex: 07-01-1945 (76 y.o. M) ?Treating RN: Carlene Coria ?Primary Care Shaneisha Burkel: Tommi Rumps Other Clinician: ?Referring Xandria Gallaga: Tommi Rumps ?Treating Latroya Ng/Extender: Jeri Cos ?Weeks in  Treatment: 4 ?Clinic Level of Care Assessment Items ?TOOL 4 Quantity Score ?X - Use when only an EandM is performed on FOLLOW-UP visit 1 0 ?ASSESSMENTS - Nursing Assessment / Reassessment ?X - Reassessment of Co-morbidities (includes updates in patient status) 1 10 ?X- 1 5 ?Reassessment of Adherence to Treatment Plan ?ASSESSMENTS - Wound and Skin Assessment / Reassessment ?'[]'$  - Simple Wound Assessment / Reassessment - one wound 0 ?X- 2 5 ?Complex Wound Assessment / Reassessment - multiple wounds ?'[]'$  - 0 ?Dermatologic / Skin Assessment (not related to wound area) ?ASSESSMENTS - Focused Assessment ?'[]'$  - Circumferential Edema Measurements - multi extremities 0 ?'[]'$  - 0 ?Nutritional Assessment / Counseling / Intervention ?'[]'$  - 0 ?Lower Extremity Assessment (monofilament, tuning fork, pulses) ?'[]'$  - 0 ?Peripheral Arterial Disease Assessment (using hand held doppler) ?ASSESSMENTS - Ostomy and/or Continence Assessment and Care ?'[]'$  - Incontinence Assessment and Management 0 ?'[]'$  - 0 ?Ostomy Care Assessment and Management (repouching, etc.) ?PROCESS - Coordination of Care ?X - Simple Patient / Family Education for ongoing care 1 15 ?'[]'$  - 0 ?Complex (extensive) Patient / Family Education for ongoing care ?'[]'$  - 0 ?Staff obtains Consents, Records, Test Results / Process Orders ?'[]'$  - 0 ?Staff telephones HHA, Nursing Homes / Clarify orders / etc ?'[]'$  - 0 ?Routine Transfer to another Facility (non-emergent condition) ?'[]'$  - 0 ?Routine Hospital Admission (non-emergent condition) ?'[]'$  - 0 ?New Admissions / Biomedical engineer / Ordering NPWT, Apligraf, etc. ?'[]'$  - 0 ?Emergency Hospital Admission (emergent condition) ?X- 1 10 ?Simple Discharge Coordination ?'[]'$  - 0 ?Complex (extensive) Discharge Coordination ?PROCESS - Special Needs ?'[]'$  - Pediatric / Minor Patient Management 0 ?'[]'$  - 0 ?Isolation Patient Management ?'[]'$  - 0 ?Hearing / Language / Visual special needs ?'[]'$  - 0 ?Assessment of Community assistance (transportation, D/C  planning, etc.) ?'[]'$  - 0 ?Additional assistance / Altered mentation ?'[]'$  - 0 ?Support Surface(s) Assessment (bed, cushion, seat, etc.) ?INTERVENTIONS - Wound Cleansing / Measurement ?[name], [name] ([id number]) ?'[]'$  -  0 ?Simple Wound Cleansing - one wound ?X- 2 5 ?Complex Wound Cleansing - multiple wounds ?X- 1 5 ?Wound Imaging (photographs - any number of wounds) ?'[]'$  - 0 ?Wound Tracing (instead of photographs) ?'[]'$  - 0 ?Simple Wound Measurement - one wound ?X- 2 5 ?Complex Wound Measurement - multiple wounds ?INTERVENTIONS - Wound Dressings ?X - Small Wound Dressing one or multiple wounds 2 10 ?'[]'$  - 0 ?Medium Wound Dressing one or multiple wounds ?'[]'$  - 0 ?Large Wound Dressing one or multiple wounds ?'[]'$  - 0 ?Application of Medications - topical ?'[]'$  - 0 ?Application of Medications - injection ?INTERVENTIONS - Miscellaneous ?'[]'$  - External ear exam 0 ?'[]'$  - 0 ?Specimen Collection (cultures, biopsies, blood, body fluids, etc.) ?'[]'$  - 0 ?Specimen(s) / Culture(s) sent or taken to Lab for analysis ?'[]'$  - 0 ?Patient Transfer (multiple staff / Civil Service fast streamer / Similar devices) ?'[]'$  - 0 ?Simple Staple / Suture removal (25 or less) ?'[]'$  - 0 ?Complex Staple / Suture removal (26 or more) ?'[]'$  - 0 ?Hypo / Hyperglycemic Management (close monitor of Blood Glucose) ?'[]'$  - 0 ?Ankle / Brachial Index (ABI) - do not check if billed separately ?X- 1 5 ?Vital Signs ?Has the patient been seen at the hospital within the last three years: Yes ?Total Score: 100 ?Level Of Care: New/Established - Level ?3 ?Electronic Signature(s) ?Signed: 04/24/2021 3:57:12 PM By: Carlene Coria RN ?Entered ByCarlene Coria on 04/24/2021 15:08:44 ?[name], [name] ([id number]) ?-------------------------------------------------------------------------------- ?Encounter Discharge Information Details ?Patient Name: [name], [name]. ?Date of Service: 04/24/2021 2:45 PM ?Medical Record Number: [id number] ?Patient Account Number: [id number] ?Date of Birth/Sex: 1945/03/23 (76 y.o.  M) ?Treating RN: Carlene Coria ?Primary Care Cache Bills: Tommi Rumps Other Clinician: ?Referring Selene Peltzer: Tommi Rumps ?Treating Maicee Ullman/Extender: Jeri Cos ?Weeks in Treatment: 4 ?Encounter Discharge Information Items ?Discharge Condition: Stable ?Ambulatory Status: Ambulatory ?Discharge Destination: Home ?Transportation: Private Auto ?Accompanied By: self ?Schedule Follow-up Appointment: Yes ?Clinical Summary of Care: Patient Declined ?Electronic Signature(s) ?Signed: 04/24/2021 3:57:12 PM By: Carlene Coria RN ?Entered By: Carlene Coria on 04/24/2021 15:15:26 ?[name], [name] ([id number]) ?-------------------------------------------------------------------------------- ?Lower Extremity Assessment Details ?Patient Name: [name], [name]. ?Date of Service: 04/24/2021 2:45 PM ?Medical Record Number: [id number] ?Patient Account Number: [id number] ?Date of Birth/Sex: 07-31-1945 (76 y.o. M) ?Treating RN: Carlene Coria ?Primary Care Kelsea Mousel: Tommi Rumps Other Clinician: ?Referring Verdell Dykman: Tommi Rumps ?Treating Jene Huq/Extender: Jeri Cos ?Weeks in Treatment: 4 ?Edema Assessment ?Assessed: [Left: No] [Right: No] ?Edema: [Left: Ye] [Right: s] ?Calf ?Left: Right: ?Point of Measurement: 35 cm From Medial Instep 36 cm ?Ankle ?Left: Right: ?Point of Measurement: 10 cm From Medial Instep 22 cm ?Vascular Assessment ?Pulses: ?Dorsalis Pedis ?Palpable: [Left:Yes] ?Electronic Signature(s) ?Signed: 04/24/2021 3:57:12 PM By: Carlene Coria RN ?Entered By: Carlene Coria on 04/24/2021 15:02:20 ?[name], [name] ([id number]) ?-------------------------------------------------------------------------------- ?Multi Wound Chart Details ?Patient Name: [name], [name]. ?Date of Service: 04/24/2021 2:45 PM ?Medical Record Number: [id number] ?Patient Account Number: [id number] ?Date of Birth/Sex: September 03, 1945 (76 y.o. M) ?Treating RN: Carlene Coria ?Primary Care Kendale Rembold: Tommi Rumps Other Clinician: ?Referring Marisela Line: Tommi Rumps ?Treating Teagon Kron/Extender: Jeri Cos ?Weeks in Treatment: 4 ?Vital Signs ?Height(in): ?Pulse(bpm): 78 ?Weight(lbs): 183 ?Blood Pressure(mmHg): 123/76 ?Body Mass Index(BMI): ?Temperature(??F): 97.6 ?Respiratory Rate(

## 2021-04-27 ENCOUNTER — Other Ambulatory Visit: Payer: Self-pay | Admitting: Oncology

## 2021-04-28 ENCOUNTER — Ambulatory Visit: Admission: RE | Admit: 2021-04-28 | Payer: PPO | Source: Ambulatory Visit

## 2021-05-01 ENCOUNTER — Encounter: Payer: PPO | Admitting: Physician Assistant

## 2021-05-01 ENCOUNTER — Inpatient Hospital Stay: Payer: PPO | Admitting: Family Medicine

## 2021-05-01 ENCOUNTER — Other Ambulatory Visit: Payer: Self-pay

## 2021-05-01 DIAGNOSIS — L97222 Non-pressure chronic ulcer of left calf with fat layer exposed: Secondary | ICD-10-CM | POA: Diagnosis not present

## 2021-05-01 DIAGNOSIS — E11622 Type 2 diabetes mellitus with other skin ulcer: Secondary | ICD-10-CM | POA: Diagnosis not present

## 2021-05-01 DIAGNOSIS — L97822 Non-pressure chronic ulcer of other part of left lower leg with fat layer exposed: Secondary | ICD-10-CM | POA: Diagnosis not present

## 2021-05-01 NOTE — Progress Notes (Addendum)
[name] ([id number]) ?Visit Report for 05/01/2021 ?Arrival Information Details ?Patient Name: [name], [name]. ?Date of Service: 05/01/2021 2:45 PM ?Medical Record Number: [id number] ?Patient Account Number: [id number] ?Date of Birth/Sex: 1945-12-03 (76 y.o. M) ?Treating RN: Levora Dredge ?Primary Care Bridey Brookover: Tommi Rumps Other Clinician: ?Referring Hulen Mandler: Tommi Rumps ?Treating Jachin Coury/Extender: Jeri Cos ?Weeks in Treatment: 5 ?Visit Information History Since Last Visit ?Added or deleted any medications: No ?Patient Arrived: Ambulatory ?Any new allergies or adverse reactions: No ?Arrival Time: 14:50 ?Had a fall or experienced change in No ?Accompanied By: self ?activities of daily living that may affect ?Transfer Assistance: None ?risk of falls: ?Patient Requires Transmission-Based Precautions: No ?Hospitalized since last visit: No ?Patient Has Alerts: No ?Has Dressing in Place as Prescribed: Yes ?Pain Present Now: No ?Electronic Signature(s) ?Signed: 05/01/2021 3:21:13 PM By: Levora Dredge ?Entered By: Levora Dredge on 05/01/2021 14:51:35 ?[name], [name] ([id number]) ?-------------------------------------------------------------------------------- ?Clinic Level of Care Assessment Details ?Patient Name: [name]. ?Date of Service: 05/01/2021 2:45 PM ?Medical Record Number: [id number] ?Patient Account Number: [id number] ?Date of Birth/Sex: [birth year] (76 y.o. M) ?Treating RN: Levora Dredge ?Primary Care Chaysen Tillman: Tommi Rumps Other Clinician: ?Referring Kimie Pidcock: Tommi Rumps ?Treating Kairi Harshbarger/Extender: Jeri Cos ?Weeks in Treatment: 5 ?Clinic Level of Care Assessment Items ?TOOL 4 Quantity Score ?'[]'$  - Use when only an EandM is performed on FOLLOW-UP visit 0 ?ASSESSMENTS - Nursing Assessment / Reassessment ?'[]'$  - Reassessment of Co-morbidities (includes updates in patient status) 0 ?X- 1 5 ?Reassessment of Adherence to Treatment Plan ?ASSESSMENTS - Wound and Skin Assessment /  Reassessment ?X - Simple Wound Assessment / Reassessment - one wound 1 5 ?'[]'$  - 0 ?Complex Wound Assessment / Reassessment - multiple wounds ?'[]'$  - 0 ?Dermatologic / Skin Assessment (not related to wound area) ?ASSESSMENTS - Focused Assessment ?'[]'$  - Circumferential Edema Measurements - multi extremities 0 ?'[]'$  - 0 ?Nutritional Assessment / Counseling / Intervention ?'[]'$  - 0 ?Lower Extremity Assessment (monofilament, tuning fork, pulses) ?'[]'$  - 0 ?Peripheral Arterial Disease Assessment (using hand held doppler) ?ASSESSMENTS - Ostomy and/or Continence Assessment and Care ?'[]'$  - Incontinence Assessment and Management 0 ?'[]'$  - 0 ?Ostomy Care Assessment and Management (repouching, etc.) ?PROCESS - Coordination of Care ?X - Simple Patient / Family Education for ongoing care 1 15 ?'[]'$  - 0 ?Complex (extensive) Patient / Family Education for ongoing care ?'[]'$  - 0 ?Staff obtains Consents, Records, Test Results / Process Orders ?'[]'$  - 0 ?Staff telephones HHA, Nursing Homes / Clarify orders / etc ?'[]'$  - 0 ?Routine Transfer to another Facility (non-emergent condition) ?'[]'$  - 0 ?Routine Hospital Admission (non-emergent condition) ?'[]'$  - 0 ?New Admissions / Biomedical engineer / Ordering NPWT, Apligraf, etc. ?'[]'$  - 0 ?Emergency Hospital Admission (emergent condition) ?X- 1 10 ?Simple Discharge Coordination ?'[]'$  - 0 ?Complex (extensive) Discharge Coordination ?PROCESS - Special Needs ?'[]'$  - Pediatric / Minor Patient Management 0 ?'[]'$  - 0 ?Isolation Patient Management ?'[]'$  - 0 ?Hearing / Language / Visual special needs ?'[]'$  - 0 ?Assessment of Community assistance (transportation, D/C planning, etc.) ?'[]'$  - 0 ?Additional assistance / Altered mentation ?'[]'$  - 0 ?Support Surface(s) Assessment (bed, cushion, seat, etc.) ?INTERVENTIONS - Wound Cleansing / Measurement ?[name] ([id number]) ?'[]'$  - 0 ?Simple Wound Cleansing - one wound ?X- 2 5 ?Complex Wound Cleansing - multiple wounds ?X- 1 5 ?Wound Imaging (photographs - any number of wounds) ?'[]'$  -  0 ?Wound Tracing (instead of photographs) ?'[]'$  - 0 ?Simple Wound Measurement - one wound ?X- 2 5 ?Complex Wound Measurement - multiple wounds ?  INTERVENTIONS - Wound Dressings ?X - Small Wound Dressing one or multiple wounds 2 10 ?'[]'$  - 0 ?Medium Wound Dressing one or multiple wounds ?'[]'$  - 0 ?Large Wound Dressing one or multiple wounds ?'[]'$  - 0 ?Application of Medications - topical ?'[]'$  - 0 ?Application of Medications - injection ?INTERVENTIONS - Miscellaneous ?'[]'$  - External ear exam 0 ?'[]'$  - 0 ?Specimen Collection (cultures, biopsies, blood, body fluids, etc.) ?'[]'$  - 0 ?Specimen(s) / Culture(s) sent or taken to Lab for analysis ?'[]'$  - 0 ?Patient Transfer (multiple staff / Civil Service fast streamer / Similar devices) ?'[]'$  - 0 ?Simple Staple / Suture removal (25 or less) ?'[]'$  - 0 ?Complex Staple / Suture removal (26 or more) ?'[]'$  - 0 ?Hypo / Hyperglycemic Management (close monitor of Blood Glucose) ?'[]'$  - 0 ?Ankle / Brachial Index (ABI) - do not check if billed separately ?X- 1 5 ?Vital Signs ?Has the patient been seen at the hospital within the last three years: Yes ?Total Score: 85 ?Level Of Care: New/Established - Level ?3 ?Electronic Signature(s) ?Signed: 05/01/2021 3:21:13 PM By: Levora Dredge ?Entered By: Levora Dredge on 05/01/2021 15:19:00 ?[name], [name] ([id number]) ?-------------------------------------------------------------------------------- ?Encounter Discharge Information Details ?Patient Name: [name], [name]. ?Date of Service: 05/01/2021 2:45 PM ?Medical Record Number: [id number] ?Patient Account Number: [id number] ?Date of Birth/Sex: [birth year] (76 y.o. M) ?Treating RN: Levora Dredge ?Primary Care Dell Briner: Tommi Rumps Other Clinician: ?Referring Malcom Selmer: Tommi Rumps ?Treating Levern Kalka/Extender: Jeri Cos ?Weeks in Treatment: 5 ?Encounter Discharge Information Items ?Discharge Condition: Stable ?Ambulatory Status: Ambulatory ?Discharge Destination: Home ?Transportation: Private Auto ?Accompanied By:  self ?Schedule Follow-up Appointment: Yes ?Clinical Summary of Care: ?Electronic Signature(s) ?Signed: 05/01/2021 3:21:13 PM By: Levora Dredge ?Entered By: Levora Dredge on 05/01/2021 15:19:52 ?[name], [name] ([id number]) ?-------------------------------------------------------------------------------- ?Lower Extremity Assessment Details ?Patient Name: [name], [name]. ?Date of Service: 05/01/2021 2:45 PM ?Medical Record Number: [id number] ?Patient Account Number: [id number] ?Date of Birth/Sex: 1945/11/09 (76 y.o. M) ?Treating RN: Levora Dredge ?Primary Care Aamira Bischoff: Tommi Rumps Other Clinician: ?Referring Kadyn Guild: Tommi Rumps ?Treating Tahjae Clausing/Extender: Jeri Cos ?Weeks in Treatment: 5 ?Edema Assessment ?Assessed: [Left: No] [Right: No] ?Edema: [Left: Ye] [Right: s] ?Calf ?Left: Right: ?Point of Measurement: 35 cm From Medial Instep 31.4 cm ?Ankle ?Left: Right: ?Point of Measurement: 10 cm From Medial Instep 23.5 cm ?Vascular Assessment ?Pulses: ?Dorsalis Pedis ?Palpable: [Left:Yes] ?Electronic Signature(s) ?Signed: 05/01/2021 3:21:13 PM By: Levora Dredge ?Entered By: Levora Dredge on 05/01/2021 15:05:36 ?[name], [name] ([id number]) ?-------------------------------------------------------------------------------- ?Multi Wound Chart Details ?Patient Name: [name], [name]. ?Date of Service: 05/01/2021 2:45 PM ?Medical Record Number: [id number] ?Patient Account Number: [id number] ?Date of Birth/Sex: 05/31/1945 (76 y.o. 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

## 2021-05-01 NOTE — Progress Notes (Addendum)
ABDIRAHIM, FLAVELL ([id number]) ?Visit Report for 05/01/2021 ?Chief Complaint Document Details ?Patient Name: [name], [name]. ?Date of Service: 05/01/2021 2:45 PM ?Medical Record Number: [id number] ?Patient Account Number: [id number] ?Date of Birth/Sex: 09-Jul-1945 (76 y.o. M) ?Treating RN: Levora Dredge ?Primary Care Provider: Tommi Rumps Other Clinician: ?Referring Provider: Tommi Rumps ?Treating Provider/Extender: Jeri Cos ?Weeks in Treatment: 5 ?Information Obtained from: Patient ?Chief Complaint ?Left leg ulcer ?Electronic Signature(s) ?Signed: 05/01/2021 2:51:26 PM By: Worthy Keeler PA-C ?Entered By: Worthy Keeler on 05/01/2021 14:51:26 ?ALAIN, DESCHENE ([id number]) ?-------------------------------------------------------------------------------- ?HPI Details ?Patient Name: [name], [name]. ?Date of Service: 05/01/2021 2:45 PM ?Medical Record Number: [id number] ?Patient Account Number: [id number] ?Date of Birth/Sex: 1945/12/24 (76 y.o. M) ?Treating RN: Levora Dredge ?Primary Care Provider: Tommi Rumps Other Clinician: ?Referring Provider: Tommi Rumps ?Treating Provider/Extender: Jeri Cos ?Weeks in Treatment: 5 ?History of Present Illness ?HPI Description: 03/27/2021 upon evaluation today patient appears to be doing well with regard to his wound. This was actually an issue that he ?has had since November 2022. He does have evidence of leg swelling and even early signs of lymphedema. Subsequently he tells me that his ?daughter has been putting some "green-cream" on this. I am not sure what that is but nonetheless he does seem to have a lot of healing ?compared to what he had previous. Fortunately there does not appear to be any signs of active infection locally nor systemically at this time which ?is great news. Honestly as dry as his leg is and even the wound bed I think that Xeroform gauze would probably be an awesome option for him to ?try to help with getting this area to heal  appropriately. ?Patient does have a history of diabetes mellitus type 2, chronic venous insufficiency, hypertension, chronic kidney disease stage III, congestive ?heart failure, and coronary artery disease. ?04/03/2021 upon evaluation today patient's legs actually appear to be doing great his left leg where the wounds are is actually measuring a bit ?smaller and the wounds themselves are definitely smaller. I am actually extremely pleased with where we stand today and I see no signs of active ?infection locally or systemically at this time which is great news. No fevers, chills, nausea, vomiting, or diarrhea. ?04/10/2021 upon evaluation today patient's wounds are actually showing signs of improvement both anterior and posterior. I think that he is making ?excellent progress and overall I see no need for any major changes here at this point. ?3/13; patient presents for follow-up. He has no issues or complaints today. He reports developing shingles to his chest with some open draining ?vesicles. He does have some scabbed over lesions. He saw his primary care provider for this issue. He was started on medication. His daughter ?has been doing the dressing changing to his left leg. He has no issues or complaints about the leg wound. ?04/24/2021 upon evaluation today patient appears to be doing well with regard to his leg ulcers. Both are showing signs of improvement with new ?epithelial growth which is great news. Overall very pleased in that regard. ?05/01/2021 upon evaluation today patient appears to be doing well with regard to his wound. He has been tolerating the dressing changes without ?complication. Fortunately I do not see any evidence of active infection locally nor systemically which is great news. No fevers, chills, nausea, ?vomiting, or diarrhea. ?Electronic Signature(s) ?Signed: 05/01/2021 3:09:12 PM By: Worthy Keeler PA-C ?Entered By: Worthy Keeler on 05/01/2021 15:09:12 ?[name], [name]  ([id number]) ?-------------------------------------------------------------------------------- ?Physical Exam Details ?Patient Name:  [name], Christopher J. ?Date of Service: 05/01/2021 2:45 PM ?Medical Record Number: [id number] ?Patient Account Number: [id number] ?Date of Birth/Sex: 1946-01-18 (76 y.o. M) ?Treating RN: Levora Dredge ?Primary Care Provider: Tommi Rumps Other Clinician: ?Referring Provider: Tommi Rumps ?Treating Provider/Extender: Jeri Cos ?Weeks in Treatment: 5 ?Constitutional ?Well-nourished and well-hydrated in no acute distress. ?Respiratory ?normal breathing without difficulty. ?Psychiatric ?this patient is able to make decisions and demonstrates good insight into disease process. Alert and Oriented x 3. pleasant and cooperative. ?Notes ?Upon inspection patient's wound bed actually showed signs of good granulation and epithelization at this point. Fortunately I do not see any ?evidence of active infection locally or systemically which is great both wounds are showing signs of excellent improvement which is also excellent ?news. No fevers, chills, nausea, vomiting, or diarrhea. ?Electronic Signature(s) ?Signed: 05/01/2021 3:09:40 PM By: Worthy Keeler PA-C ?Entered By: Worthy Keeler on 05/01/2021 15:09:40 ?[name], [name] ([id number]) ?-------------------------------------------------------------------------------- ?Physician Orders Details ?Patient Name: [name], [name]. ?Date of Service: 05/01/2021 2:45 PM ?Medical Record Number: [id number] ?Patient Account Number: [id number] ?Date of Birth/Sex: 19-Jul-1945 (76 y.o. M) ?Treating RN: Levora Dredge ?Primary Care Provider: Tommi Rumps Other Clinician: ?Referring Provider: Tommi Rumps ?Treating Provider/Extender: Jeri Cos ?Weeks in Treatment: 5 ?Verbal / Phone Orders: No ?Diagnosis Coding ?ICD-10 Coding ?Code Description ?E11.622 Type 2 diabetes mellitus with other skin ulcer ?I87.2 Venous insufficiency (chronic) (peripheral) ?E33.295  Non-pressure chronic ulcer of other part of left lower leg with fat layer exposed ?I10 Essential (primary) hypertension ?N18.30 Chronic kidney disease, stage 3 unspecified ?I50.42 Chronic combined systolic (congestive) and diastolic (congestive) heart failure ?I25.10 Atherosclerotic heart disease of native coronary artery without angina pectoris ?Follow-up Appointments ?o Return Appointment in 1 week. ?Bathing/ Shower/ Hygiene ?o May shower; gently cleanse wound with antibacterial soap, rinse and pat dry prior to dressing wounds ?Edema Control - Lymphedema / Segmental Compressive Device / Other ?o Tubigrip single layer applied. - side D - left leg ?o Elevate, Exercise Daily and Avoid Standing for Long Periods of Time. ?o Elevate legs to the level of the heart and pump ankles as often as possible ?o Elevate leg(s) parallel to the floor when sitting. ?Wound Treatment ?Wound #1 - Lower Leg Wound Laterality: Left, Anterior ?Cleanser: Soap and Water 3 x Per Week/30 Days ?Discharge Instructions: Gently cleanse wound with antibacterial soap, rinse and pat dry prior to dressing wounds ?Primary Dressing: Xeroform-HBD 2x2 (in/in) 3 x Per Week/30 Days ?Discharge Instructions: Apply Xeroform-HBD 2x2 (in/in) as directed ?Secondary Dressing: ABD Pad 5x9 (in/in) 3 x Per Week/30 Days ?Discharge Instructions: Cover with ABD pad ?Secured With: Medipore Tape - 85M Medipore H Soft Cloth Surgical Tape, 2x2 (in/yd) 3 x Per Week/30 Days ?Secured With: [employer] or Non-Sterile 6-ply 4.5x4 (yd/yd) 3 x Per Week/30 Days ?Discharge Instructions: Apply Kerlix as directed ?Wound #2 - Lower Leg Wound Laterality: Left, Posterior ?Cleanser: Soap and Water 3 x Per Week/30 Days ?Discharge Instructions: Gently cleanse wound with antibacterial soap, rinse and pat dry prior to dressing wounds ?Primary Dressing: Xeroform-HBD 2x2 (in/in) 3 x Per Week/30 Days ?Discharge Instructions: Apply Xeroform-HBD 2x2 (in/in) as  directed ?Secondary Dressing: ABD Pad 5x9 (in/in) 3 x Per Week/30 Days ?Discharge Instructions: Cover with ABD pad ?Secured With: Medipore Tape - 85M Medipore H Soft Cloth Surgical Tape, 2x2 (in/yd) 3 x Per Week/30 Days ?Secured With: [employer]

## 2021-05-02 ENCOUNTER — Other Ambulatory Visit: Payer: Self-pay

## 2021-05-02 ENCOUNTER — Encounter: Payer: Self-pay | Admitting: Family Medicine

## 2021-05-02 ENCOUNTER — Ambulatory Visit (INDEPENDENT_AMBULATORY_CARE_PROVIDER_SITE_OTHER): Payer: PPO | Admitting: Family Medicine

## 2021-05-02 DIAGNOSIS — I5023 Acute on chronic systolic (congestive) heart failure: Secondary | ICD-10-CM | POA: Diagnosis not present

## 2021-05-02 DIAGNOSIS — B029 Zoster without complications: Secondary | ICD-10-CM | POA: Diagnosis not present

## 2021-05-02 MED ORDER — LORATADINE 10 MG PO TABS: 10.0000 mg | ORAL_TABLET | Freq: Every day | ORAL | 11 refills | Status: DC

## 2021-05-02 NOTE — Assessment & Plan Note (Addendum)
The patient is having an acute on chronic systolic heart failure exacerbation.  I suspect this is causing his cough.  His BNP was elevated and he had pleural effusions arguing for fluid retention.  We will have him increase his Lasix to 20 mg twice daily for 3 days and then return for labs to recheck his kidney function.  Discussed he may need hospital admission for IV Lasix if not improving.  If he develops any shortness of breath or worsening symptoms he will seek medical attention.  He will follow-up with me in 1 week. ?

## 2021-05-02 NOTE — Progress Notes (Signed)
?Tommi Rumps, MD ?Phone: [phone] ? ?[name]. is a 76 y.o. male who presents today for f/u. ? ?Shingles: Patient notes onset about 3 weeks ago.  He was evaluated in the emergency department and placed on Valtrex.  He has completed the Valtrex.  He notes there is no pain.  He notes the lesions have crusted over.  He feels as though it is getting better though he does have quite a bit of itching with this. ? ?CHF: Patient was noted to have a CHF exacerbation when he was in the emergency department.  They wanted to admit him for treatment though he refused admission.  He has had a cough for the last 3 weeks.  He feels some congestion in his chest.  He notes no shortness of breath or edema.  No fevers.  No COVID exposures or COVID testing.  He is try lots of cough syrup.  He takes Lasix once daily.  He also notes some orthopnea though no PND.  His BNP was very elevated in the emergency department.  His chest x-ray had new onset bilateral pleural effusions that were small. ? ?Social History  ? ?Tobacco Use  ?Smoking Status Never  ?Smokeless Tobacco Never  ? ? ?Current Outpatient Medications on File Prior to Visit  ?Medication Sig Dispense Refill  ? amLODipine (NORVASC) 2.5 MG tablet Take by mouth.    ? aspirin EC 81 MG tablet Take 1 tablet (81 mg total) by mouth daily. 90 tablet 3  ? atorvastatin (LIPITOR) 40 MG tablet TAKE 1 TABLET BY MOUTH EVERY DAY 90 tablet 3  ? carvedilol (COREG) 3.125 MG tablet TAKE 1 TABLET(3.125 MG) BY MOUTH TWICE DAILY 180 tablet 3  ? cholecalciferol (VITAMIN D3) 25 MCG (1000 UNIT) tablet Take 1,000 Units by mouth daily.    ? docusate sodium (COLACE) 100 MG capsule Take 1 capsule (100 mg total) by mouth daily as needed for mild constipation. 30 capsule 5  ? ENTRESTO 24-26 MG TAKE 1 TABLET BY MOUTH TWICE DAILY 60 tablet 5  ? ferrous sulfate 325 (65 FE) MG EC tablet Take 1 tablet (325 mg total) by mouth 2 (two) times daily with a meal. 60 tablet 4  ? furosemide (LASIX) 20 MG  tablet Take 1 tablet (20 mg total) by mouth daily. 30 tablet 6  ? gabapentin (NEURONTIN) 100 MG capsule Take by mouth.    ? HYDROcodone-acetaminophen (NORCO/VICODIN) 5-325 MG tablet Take 1 tablet by mouth every 6 (six) hours as needed for moderate pain. 15 tablet 0  ? ibuprofen (ADVIL) 800 MG tablet Take 1 tablet (800 mg total) by mouth every 8 (eight) hours as needed. 30 tablet 0  ? Lidocaine 4 % GEL Apply 1 application. topically 4 (four) times daily as needed. 113 g 0  ? metFORMIN (GLUCOPHAGE) 500 MG tablet TAKE 1 TABLET(500 MG) BY MOUTH TWICE DAILY WITH A MEAL 180 tablet 3  ? spironolactone (ALDACTONE) 25 MG tablet TAKE 1 TABLET(25 MG) BY MOUTH DAILY 30 tablet 0  ? tamsulosin (FLOMAX) 0.4 MG CAPS capsule TAKE 1 CAPSULE(0.4 MG) BY MOUTH DAILY AFTER SUPPER (Patient taking differently: Take 0.4 mg by mouth daily after breakfast.) 30 capsule 11  ? valACYclovir (VALTREX) 1000 MG tablet Take 1 tablet (1,000 mg total) by mouth 2 (two) times daily. 21 tablet 0  ? ?No current facility-administered medications on file prior to visit.  ? ? ? ?ROS see history of present illness ? ?Objective ? ?Physical Exam ?Vitals:  ? 05/02/21 1259  ?  BP: 118/70  ?Pulse: 72  ?Temp: 98.2 ?F (36.8 ?C)  ?SpO2: 97%  ? ? ?BP Readings from Last 3 Encounters:  ?05/02/21 118/70  ?04/18/21 126/72  ?04/14/21 (!) 150/75  ? ?Wt Readings from Last 3 Encounters:  ?05/02/21 191 lb (86.6 kg)  ?04/18/21 184 lb 4 oz (83.6 kg)  ?04/14/21 174 lb 9.7 oz (79.2 kg)  ? ? ?Physical Exam ?Constitutional:   ?   General: He is not in acute distress. ?   Appearance: He is not diaphoretic.  ?Cardiovascular:  ?   Rate and Rhythm: Normal rate and regular rhythm.  ?   Heart sounds: Normal heart sounds.  ?Pulmonary:  ?   Effort: Pulmonary effort is normal.  ?   Breath sounds: Normal breath sounds.  ?Skin: ?   General: Skin is warm and dry.  ? ?    ?   Comments: Shingles rash in the area as outlined, lesions are crusted  ?Neurological:  ?   Mental Status: He is alert.   ? ? ? ?Assessment/Plan: Please see individual problem list. ? ?Problem List Items Addressed This Visit   ? ? Acute on chronic systolic CHF (congestive heart failure) ([place])  ?  The patient is having an acute on chronic systolic heart failure exacerbation.  I suspect this is causing his cough.  His BNP was elevated and he had pleural effusions arguing for fluid retention.  We will have him increase his Lasix to 20 mg twice daily for 3 days and then return for labs to recheck his kidney function.  Discussed he may need hospital admission for IV Lasix if not improving.  If he develops any shortness of breath or worsening symptoms he will seek medical attention.  He will follow-up with me in 1 week. ?  ?  ? Relevant Orders  ? Basic Metabolic Panel (BMET)  ? Shingles  ?  Progressively improving.  Itching is bothering him the most at this point.  We will have him start on Claritin 10 mg once daily to see if that will help.  He will monitor for signs of infection.  I discussed that he should get the Shingrix vaccine once his shingles completely resolves.  Discussed that his insurance should pay for this though he should check before letting them stick him. ?  ?  ? Relevant Medications  ? loratadine (CLARITIN) 10 MG tablet  ? ? ?Return in about 3 days (around 05/05/2021) for Labs, 1 week PCP recheck heart failure. ? ?This visit occurred during the SARS-CoV-2 public health emergency.  Safety protocols were in place, including screening questions prior to the visit, additional usage of staff PPE, and extensive cleaning of exam room while observing appropriate contact time as indicated for disinfecting solutions.  ? ? ?Tommi Rumps, MD ?[place] ? ?

## 2021-05-02 NOTE — Patient Instructions (Signed)
Nice to see you. ?I would like for you to take your Lasix 20 mg twice daily for the next 3 days.  We will have you come in on Friday for follow-up labs with increasing this dosage.  If you develop shortness of breath or any worsening breathing symptoms you need to seek medical attention again. ?We will try Claritin 10 mg once daily for your itching from your shingles.  If your shingles worsens again or if you develop any signs of infection such as redness, drainage of pus, or fevers you need to seek medical attention as well. ?

## 2021-05-02 NOTE — Assessment & Plan Note (Addendum)
Progressively improving.  Itching is bothering him the most at this point.  We will have him start on Claritin 10 mg once daily to see if that will help.  He will monitor for signs of infection.  I discussed that he should get the Shingrix vaccine once his shingles completely resolves.  Discussed that his insurance should pay for this though he should check before letting them stick him. ?

## 2021-05-03 ENCOUNTER — Inpatient Hospital Stay: Payer: PPO | Attending: Oncology

## 2021-05-03 DIAGNOSIS — D696 Thrombocytopenia, unspecified: Secondary | ICD-10-CM | POA: Diagnosis not present

## 2021-05-03 DIAGNOSIS — Z85038 Personal history of other malignant neoplasm of large intestine: Secondary | ICD-10-CM | POA: Insufficient documentation

## 2021-05-03 DIAGNOSIS — E538 Deficiency of other specified B group vitamins: Secondary | ICD-10-CM | POA: Insufficient documentation

## 2021-05-03 DIAGNOSIS — N1831 Chronic kidney disease, stage 3a: Secondary | ICD-10-CM | POA: Insufficient documentation

## 2021-05-03 DIAGNOSIS — D631 Anemia in chronic kidney disease: Secondary | ICD-10-CM | POA: Diagnosis not present

## 2021-05-03 LAB — CBC WITH DIFFERENTIAL/PLATELET
Abs Immature Granulocytes: 0.01 10*3/uL (ref 0.00–0.07)
Basophils Absolute: 0 10*3/uL (ref 0.0–0.1)
Basophils Relative: 1 %
Eosinophils Absolute: 0.2 10*3/uL (ref 0.0–0.5)
Eosinophils Relative: 7 %
HCT: 36.7 % — ABNORMAL LOW (ref 39.0–52.0)
Hemoglobin: 11.7 g/dL — ABNORMAL LOW (ref 13.0–17.0)
Immature Granulocytes: 0 %
Lymphocytes Relative: 19 %
Lymphs Abs: 0.6 10*3/uL — ABNORMAL LOW (ref 0.7–4.0)
MCH: 33.5 pg (ref 26.0–34.0)
MCHC: 31.9 g/dL (ref 30.0–36.0)
MCV: 105.2 fL — ABNORMAL HIGH (ref 80.0–100.0)
Monocytes Absolute: 0.4 10*3/uL (ref 0.1–1.0)
Monocytes Relative: 12 %
Neutro Abs: 1.9 10*3/uL (ref 1.7–7.7)
Neutrophils Relative %: 61 %
Platelets: 113 10*3/uL — ABNORMAL LOW (ref 150–400)
RBC: 3.49 MIL/uL — ABNORMAL LOW (ref 4.22–5.81)
RDW: 19.3 % — ABNORMAL HIGH (ref 11.5–15.5)
WBC: 3.1 10*3/uL — ABNORMAL LOW (ref 4.0–10.5)
nRBC: 0 % (ref 0.0–0.2)

## 2021-05-03 LAB — IRON AND TIBC
Iron: 69 ug/dL (ref 45–182)
Saturation Ratios: 23 % (ref 17.9–39.5)
TIBC: 295 ug/dL (ref 250–450)
UIBC: 226 ug/dL

## 2021-05-03 LAB — VITAMIN B12: Vitamin B-12: 464 pg/mL (ref 180–914)

## 2021-05-03 LAB — COMPREHENSIVE METABOLIC PANEL
ALT: 20 U/L (ref 0–44)
AST: 23 U/L (ref 15–41)
Albumin: 2.9 g/dL — ABNORMAL LOW (ref 3.5–5.0)
Alkaline Phosphatase: 89 U/L (ref 38–126)
Anion gap: 7 (ref 5–15)
BUN: 21 mg/dL (ref 8–23)
CO2: 24 mmol/L (ref 22–32)
Calcium: 8.5 mg/dL — ABNORMAL LOW (ref 8.9–10.3)
Chloride: 111 mmol/L (ref 98–111)
Creatinine, Ser: 1.52 mg/dL — ABNORMAL HIGH (ref 0.61–1.24)
GFR, Estimated: 48 mL/min — ABNORMAL LOW (ref >60)
Glucose, Bld: 191 mg/dL — ABNORMAL HIGH (ref 70–99)
Potassium: 4 mmol/L (ref 3.5–5.1)
Sodium: 142 mmol/L (ref 135–145)
Total Bilirubin: 1.2 mg/dL (ref 0.3–1.2)
Total Protein: 6.2 g/dL — ABNORMAL LOW (ref 6.5–8.1)

## 2021-05-04 LAB — CEA: CEA: 2.3 ng/mL (ref 0.0–4.7)

## 2021-05-05 ENCOUNTER — Encounter: Payer: Self-pay | Admitting: Oncology

## 2021-05-05 ENCOUNTER — Inpatient Hospital Stay: Payer: PPO

## 2021-05-05 ENCOUNTER — Other Ambulatory Visit: Payer: PPO

## 2021-05-05 ENCOUNTER — Inpatient Hospital Stay: Payer: PPO | Admitting: Oncology

## 2021-05-08 ENCOUNTER — Encounter: Payer: PPO | Attending: Physician Assistant | Admitting: Physician Assistant

## 2021-05-08 DIAGNOSIS — L97822 Non-pressure chronic ulcer of other part of left lower leg with fat layer exposed: Secondary | ICD-10-CM | POA: Diagnosis not present

## 2021-05-08 DIAGNOSIS — E1122 Type 2 diabetes mellitus with diabetic chronic kidney disease: Secondary | ICD-10-CM | POA: Insufficient documentation

## 2021-05-08 DIAGNOSIS — N183 Chronic kidney disease, stage 3 unspecified: Secondary | ICD-10-CM | POA: Diagnosis not present

## 2021-05-08 DIAGNOSIS — E11622 Type 2 diabetes mellitus with other skin ulcer: Secondary | ICD-10-CM | POA: Insufficient documentation

## 2021-05-08 DIAGNOSIS — I10 Essential (primary) hypertension: Secondary | ICD-10-CM | POA: Diagnosis not present

## 2021-05-08 DIAGNOSIS — I130 Hypertensive heart and chronic kidney disease with heart failure and stage 1 through stage 4 chronic kidney disease, or unspecified chronic kidney disease: Secondary | ICD-10-CM | POA: Insufficient documentation

## 2021-05-08 DIAGNOSIS — I5042 Chronic combined systolic (congestive) and diastolic (congestive) heart failure: Secondary | ICD-10-CM | POA: Insufficient documentation

## 2021-05-08 DIAGNOSIS — E11621 Type 2 diabetes mellitus with foot ulcer: Secondary | ICD-10-CM | POA: Diagnosis not present

## 2021-05-08 DIAGNOSIS — L97222 Non-pressure chronic ulcer of left calf with fat layer exposed: Secondary | ICD-10-CM | POA: Diagnosis not present

## 2021-05-08 DIAGNOSIS — I2510 Atherosclerotic heart disease of native coronary artery without angina pectoris: Secondary | ICD-10-CM | POA: Insufficient documentation

## 2021-05-08 DIAGNOSIS — I872 Venous insufficiency (chronic) (peripheral): Secondary | ICD-10-CM | POA: Insufficient documentation

## 2021-05-08 NOTE — Progress Notes (Addendum)
FREDDI, SCHRAGER ([id number]) ?Visit Report for 05/08/2021 ?Arrival Information Details ?Patient Name: [name], [name]. ?Date of Service: 05/08/2021 2:45 PM ?Medical Record Number: [id number] ?Patient Account Number: [id number] ?Date of Birth/Sex: 06-16-1945 (76 y.o. M) ?Treating RN: Carlene Coria ?Primary Care Ralphine Hinks: Tommi Rumps Other Clinician: ?Referring Jaymason Ledesma: Tommi Rumps ?Treating Darrien Laakso/Extender: Jeri Cos ?Weeks in Treatment: 6 ?Visit Information History Since Last Visit ?All ordered tests and consults were completed: No ?Patient Arrived: Ambulatory ?Added or deleted any medications: No ?Arrival Time: 14:56 ?Any new allergies or adverse reactions: No ?Accompanied By: self ?Had a fall or experienced change in No ?Transfer Assistance: None ?activities of daily living that may affect ?Patient Identification Verified: Yes ?risk of falls: ?Secondary Verification Process Completed: Yes ?Signs or symptoms of abuse/neglect since last visito No ?Patient Requires Transmission-Based Precautions: No ?Hospitalized since last visit: No ?Patient Has Alerts: No ?Implantable device outside of the clinic excluding No ?cellular tissue based products placed in the center ?since last visit: ?Has Dressing in Place as Prescribed: Yes ?Has Compression in Place as Prescribed: Yes ?Pain Present Now: No ?Electronic Signature(s) ?Signed: 05/09/2021 9:58:52 AM By: Carlene Coria RN ?Entered By: Carlene Coria on 05/08/2021 14:59:47 ?DAMEN, WINDSOR ([id number]) ?-------------------------------------------------------------------------------- ?Clinic Level of Care Assessment Details ?Patient Name: [name], [name]. ?Date of Service: 05/08/2021 2:45 PM ?Medical Record Number: [id number] ?Patient Account Number: [id number] ?Date of Birth/Sex: 1945/07/04 (76 y.o. M) ?Treating RN: Carlene Coria ?Primary Care Lavender Stanke: Tommi Rumps Other Clinician: ?Referring Keeana Pieratt: Tommi Rumps ?Treating Kehaulani Fruin/Extender: Jeri Cos ?Weeks in  Treatment: 6 ?Clinic Level of Care Assessment Items ?TOOL 4 Quantity Score ?X - Use when only an EandM is performed on FOLLOW-UP visit 1 0 ?ASSESSMENTS - Nursing Assessment / Reassessment ?X - Reassessment of Co-morbidities (includes updates in patient status) 1 10 ?X- 1 5 ?Reassessment of Adherence to Treatment Plan ?ASSESSMENTS - Wound and Skin Assessment / Reassessment ?'[]'$  - Simple Wound Assessment / Reassessment - one wound 0 ?X- 2 5 ?Complex Wound Assessment / Reassessment - multiple wounds ?'[]'$  - 0 ?Dermatologic / Skin Assessment (not related to wound area) ?ASSESSMENTS - Focused Assessment ?'[]'$  - Circumferential Edema Measurements - multi extremities 0 ?'[]'$  - 0 ?Nutritional Assessment / Counseling / Intervention ?'[]'$  - 0 ?Lower Extremity Assessment (monofilament, tuning fork, pulses) ?'[]'$  - 0 ?Peripheral Arterial Disease Assessment (using hand held doppler) ?ASSESSMENTS - Ostomy and/or Continence Assessment and Care ?'[]'$  - Incontinence Assessment and Management 0 ?'[]'$  - 0 ?Ostomy Care Assessment and Management (repouching, etc.) ?PROCESS - Coordination of Care ?X - Simple Patient / Family Education for ongoing care 1 15 ?'[]'$  - 0 ?Complex (extensive) Patient / Family Education for ongoing care ?'[]'$  - 0 ?Staff obtains Consents, Records, Test Results / Process Orders ?'[]'$  - 0 ?Staff telephones HHA, Nursing Homes / Clarify orders / etc ?'[]'$  - 0 ?Routine Transfer to another Facility (non-emergent condition) ?'[]'$  - 0 ?Routine Hospital Admission (non-emergent condition) ?'[]'$  - 0 ?New Admissions / Biomedical engineer / Ordering NPWT, Apligraf, etc. ?'[]'$  - 0 ?Emergency Hospital Admission (emergent condition) ?X- 1 10 ?Simple Discharge Coordination ?'[]'$  - 0 ?Complex (extensive) Discharge Coordination ?PROCESS - Special Needs ?'[]'$  - Pediatric / Minor Patient Management 0 ?'[]'$  - 0 ?Isolation Patient Management ?'[]'$  - 0 ?Hearing / Language / Visual special needs ?'[]'$  - 0 ?Assessment of Community assistance (transportation, D/C  planning, etc.) ?'[]'$  - 0 ?Additional assistance / Altered mentation ?'[]'$  - 0 ?Support Surface(s) Assessment (bed, cushion, seat, etc.) ?INTERVENTIONS - Wound Cleansing / Measurement ?[name], [name] ([id number]) ?'[]'$  -  0 ?Simple Wound Cleansing - one wound ?X- 2 5 ?Complex Wound Cleansing - multiple wounds ?X- 1 5 ?Wound Imaging (photographs - any number of wounds) ?'[]'$  - 0 ?Wound Tracing (instead of photographs) ?'[]'$  - 0 ?Simple Wound Measurement - one wound ?X- 2 5 ?Complex Wound Measurement - multiple wounds ?INTERVENTIONS - Wound Dressings ?X - Small Wound Dressing one or multiple wounds 1 10 ?'[]'$  - 0 ?Medium Wound Dressing one or multiple wounds ?'[]'$  - 0 ?Large Wound Dressing one or multiple wounds ?'[]'$  - 0 ?Application of Medications - topical ?'[]'$  - 0 ?Application of Medications - injection ?INTERVENTIONS - Miscellaneous ?'[]'$  - External ear exam 0 ?'[]'$  - 0 ?Specimen Collection (cultures, biopsies, blood, body fluids, etc.) ?'[]'$  - 0 ?Specimen(s) / Culture(s) sent or taken to Lab for analysis ?'[]'$  - 0 ?Patient Transfer (multiple staff / Civil Service fast streamer / Similar devices) ?'[]'$  - 0 ?Simple Staple / Suture removal (25 or less) ?'[]'$  - 0 ?Complex Staple / Suture removal (26 or more) ?'[]'$  - 0 ?Hypo / Hyperglycemic Management (close monitor of Blood Glucose) ?'[]'$  - 0 ?Ankle / Brachial Index (ABI) - do not check if billed separately ?X- 1 5 ?Vital Signs ?Has the patient been seen at the hospital within the last three years: Yes ?Total Score: 90 ?Level Of Care: New/Established - Level ?3 ?Electronic Signature(s) ?Signed: 05/09/2021 9:58:52 AM By: Carlene Coria RN ?Entered By: Carlene Coria on 05/08/2021 15:26:38 ?[name], [name] ([id number]) ?-------------------------------------------------------------------------------- ?Encounter Discharge Information Details ?Patient Name: [name], [name]. ?Date of Service: 05/08/2021 2:45 PM ?Medical Record Number: [id number] ?Patient Account Number: [id number] ?Date of Birth/Sex: [birth year] (76 y.o. M) ?Treating  RN: Carlene Coria ?Primary Care Aleksia Freiman: Tommi Rumps Other Clinician: ?Referring Jourdin Connors: Tommi Rumps ?Treating Daschel Roughton/Extender: Jeri Cos ?Weeks in Treatment: 6 ?Encounter Discharge Information Items ?Discharge Condition: Stable ?Ambulatory Status: Ambulatory ?Discharge Destination: Home ?Transportation: Private Auto ?Accompanied By: self ?Schedule Follow-up Appointment: Yes ?Clinical Summary of Care: Patient Declined ?Electronic Signature(s) ?Signed: 05/09/2021 9:58:52 AM By: Carlene Coria RN ?Entered By: Carlene Coria on 05/08/2021 15:27:40 ?[name], [name] ([id number]) ?-------------------------------------------------------------------------------- ?Lower Extremity Assessment Details ?Patient Name: [name], [name]. ?Date of Service: 05/08/2021 2:45 PM ?Medical Record Number: [id number] ?Patient Account Number: [id number] ?Date of Birth/Sex: 10-15-1945 (76 y.o. M) ?Treating RN: Carlene Coria ?Primary Care Rashawna Scoles: Tommi Rumps Other Clinician: ?Referring Jacqualyn Sedgwick: Tommi Rumps ?Treating Sky Borboa/Extender: Jeri Cos ?Weeks in Treatment: 6 ?Edema Assessment ?Assessed: [Left: No] [Right: No] ?Edema: [Left: Ye] [Right: s] ?Calf ?Left: Right: ?Point of Measurement: 35 cm From Medial Instep 30 cm ?Ankle ?Left: Right: ?Point of Measurement: 10 cm From Medial Instep 23 cm ?Vascular Assessment ?Pulses: ?Dorsalis Pedis ?Palpable: [Left:Yes] ?Electronic Signature(s) ?Signed: 05/09/2021 9:58:52 AM By: Carlene Coria RN ?Entered By: Carlene Coria on 05/08/2021 15:05:41 ?[name], [name] ([id number]) ?-------------------------------------------------------------------------------- ?Multi Wound Chart Details ?Patient Name: [name], [name]. ?Date of Service: 05/08/2021 2:45 PM ?Medical Record Number: [id number] ?Patient Account Number: [id number] ?Date of Birth/Sex: [birth year] (76 y.o. M) ?Treating RN: Carlene Coria ?Primary Care Carlita Whitcomb: Tommi Rumps Other Clinician: ?Referring Makenzee Choudhry: Tommi Rumps ?Treating  Baby Stairs/Extender: Jeri Cos ?Weeks in Treatment: 6 ?Vital Signs ?Height(in): ?Pulse(bpm): 83 ?Weight(lbs): 183 ?Blood Pressure(mmHg): 123/82 ?Body Mass Index(BMI): ?Temperature(??F): 98.1 ?Respiratory Rate(breaths/min

## 2021-05-08 NOTE — Progress Notes (Addendum)
HADDON, FYFE ([id number]) ?Visit Report for 05/08/2021 ?Chief Complaint Document Details ?Patient Name: [name], [name]. ?Date of Service: 05/08/2021 2:45 PM ?Medical Record Number: [id number] ?Patient Account Number: [id number] ?Date of Birth/Sex: 10/02/1945 (76 y.o. M) ?Treating RN: Carlene Coria ?Primary Care Provider: Tommi Rumps Other Clinician: ?Referring Provider: Tommi Rumps ?Treating Provider/Extender: Jeri Cos ?Weeks in Treatment: 6 ?Information Obtained from: Patient ?Chief Complaint ?Left leg ulcer ?Electronic Signature(s) ?Signed: 05/08/2021 2:49:55 PM By: Worthy Keeler PA-C ?Entered By: Worthy Keeler on 05/08/2021 14:49:55 ?[name], [name] ([id number]) ?-------------------------------------------------------------------------------- ?HPI Details ?Patient Name: [name], [name]. ?Date of Service: 05/08/2021 2:45 PM ?Medical Record Number: [id number] ?Patient Account Number: [id number] ?Date of Birth/Sex: 05-24-1945 (76 y.o. M) ?Treating RN: Carlene Coria ?Primary Care Provider: Tommi Rumps Other Clinician: ?Referring Provider: Tommi Rumps ?Treating Provider/Extender: Jeri Cos ?Weeks in Treatment: 6 ?History of Present Illness ?HPI Description: 03/27/2021 upon evaluation today patient appears to be doing well with regard to his wound. This was actually an issue that he ?has had since November 2022. He does have evidence of leg swelling and even early signs of lymphedema. Subsequently he tells me that his ?daughter has been putting some "green-cream" on this. I am not sure what that is but nonetheless he does seem to have a lot of healing ?compared to what he had previous. Fortunately there does not appear to be any signs of active infection locally nor systemically at this time which ?is great news. Honestly as dry as his leg is and even the wound bed I think that Xeroform gauze would probably be an awesome option for him to ?try to help with getting this area to heal appropriately. ?Patient  does have a history of diabetes mellitus type 2, chronic venous insufficiency, hypertension, chronic kidney disease stage III, congestive ?heart failure, and coronary artery disease. ?04/03/2021 upon evaluation today patient's legs actually appear to be doing great his left leg where the wounds are is actually measuring a bit ?smaller and the wounds themselves are definitely smaller. I am actually extremely pleased with where we stand today and I see no signs of active ?infection locally or systemically at this time which is great news. No fevers, chills, nausea, vomiting, or diarrhea. ?04/10/2021 upon evaluation today patient's wounds are actually showing signs of improvement both anterior and posterior. I think that he is making ?excellent progress and overall I see no need for any major changes here at this point. ?3/13; patient presents for follow-up. He has no issues or complaints today. He reports developing shingles to his chest with some open draining ?vesicles. He does have some scabbed over lesions. He saw his primary care provider for this issue. He was started on medication. His daughter ?has been doing the dressing changing to his left leg. He has no issues or complaints about the leg wound. ?04/24/2021 upon evaluation today patient appears to be doing well with regard to his leg ulcers. Both are showing signs of improvement with new ?epithelial growth which is great news. Overall very pleased in that regard. ?05/01/2021 upon evaluation today patient appears to be doing well with regard to his wound. He has been tolerating the dressing changes without ?complication. Fortunately I do not see any evidence of active infection locally nor systemically which is great news. No fevers, chills, nausea, ?vomiting, or diarrhea. ?05/08/2021 upon evaluation today patient's anterior leg actually appears to be doing quite well. I am very pleased in that regard. I do not see any ?signs of active infection  at this time which  is great news. ?Electronic Signature(s) ?Signed: 05/08/2021 3:48:45 PM By: Worthy Keeler PA-C ?Entered By: Worthy Keeler on 05/08/2021 15:48:44 ?[name], [name] ([id number]) ?-------------------------------------------------------------------------------- ?Physical Exam Details ?Patient Name: [name], [name]. ?Date of Service: 05/08/2021 2:45 PM ?Medical Record Number: [id number] ?Patient Account Number: [id number] ?Date of Birth/Sex: 02-Aug-1945 (76 y.o. M) ?Treating RN: Carlene Coria ?Primary Care Provider: Tommi Rumps Other Clinician: ?Referring Provider: Tommi Rumps ?Treating Provider/Extender: Jeri Cos ?Weeks in Treatment: 6 ?Constitutional ?Well-nourished and well-hydrated in no acute distress. ?Respiratory ?normal breathing without difficulty. ?Psychiatric ?this patient is able to make decisions and demonstrates good insight into disease process. Alert and Oriented x 3. pleasant and cooperative. ?Notes ?Upon inspection patient's wound bed actually showed signs of good granulation epithelization the anterior leg is healed the posterior still has some ?work to do here. I do believe switching to silver collagen dressing may be best. ?Electronic Signature(s) ?Signed: 05/08/2021 3:49:24 PM By: Worthy Keeler PA-C ?Entered By: Worthy Keeler on 05/08/2021 15:49:24 ?[name], [name] ([id number]) ?-------------------------------------------------------------------------------- ?Physician Orders Details ?Patient Name: [name], [name]. ?Date of Service: 05/08/2021 2:45 PM ?Medical Record Number: [id number] ?Patient Account Number: [id number] ?Date of Birth/Sex: [birth year] (76 y.o. M) ?Treating RN: Carlene Coria ?Primary Care Provider: Tommi Rumps Other Clinician: ?Referring Provider: Tommi Rumps ?Treating Provider/Extender: Jeri Cos ?Weeks in Treatment: 6 ?Verbal / Phone Orders: No ?Diagnosis Coding ?ICD-10 Coding ?Code Description ?E11.622 Type 2 diabetes mellitus with other skin ulcer ?I87.2 Venous  insufficiency (chronic) (peripheral) ?G38.756 Non-pressure chronic ulcer of other part of left lower leg with fat layer exposed ?I10 Essential (primary) hypertension ?N18.30 Chronic kidney disease, stage 3 unspecified ?I50.42 Chronic combined systolic (congestive) and diastolic (congestive) heart failure ?I25.10 Atherosclerotic heart disease of native coronary artery without angina pectoris ?Follow-up Appointments ?o Return Appointment in 1 week. ?Bathing/ Shower/ Hygiene ?o May shower; gently cleanse wound with antibacterial soap, rinse and pat dry prior to dressing wounds ?Edema Control - Lymphedema / Segmental Compressive Device / Other ?o Tubigrip single layer applied. - side D - left leg ?o Elevate, Exercise Daily and Avoid Standing for Long Periods of Time. ?o Elevate legs to the level of the heart and pump ankles as often as possible ?o Elevate leg(s) parallel to the floor when sitting. ?Wound Treatment ?Wound #2 - Lower Leg Wound Laterality: Left, Posterior ?Cleanser: Soap and Water 3 x Per Week/30 Days ?Discharge Instructions: Gently cleanse wound with antibacterial soap, rinse and pat dry prior to dressing wounds ?Primary Dressing: Prisma 4.34 (in) 3 x Per Week/30 Days ?Discharge Instructions: Moisten w/normal saline or sterile water; Cover wound as directed. Do not remove from wound bed. ?Secondary Dressing: ABD Pad 5x9 (in/in) 3 x Per Week/30 Days ?Discharge Instructions: Cover with ABD pad ?Secured With: Medipore Tape - 85M Medipore H Soft Cloth Surgical Tape, 2x2 (in/yd) 3 x Per Week/30 Days ?Secured With: [employer] or Non-Sterile 6-ply 4.5x4 (yd/yd) 3 x Per Week/30 Days ?Discharge Instructions: Apply Kerlix as directed ?Compression Wrap: coban 3 x Per Week/30 Days ?Electronic Signature(s) ?Signed: 05/08/2021 4:14:46 PM By: Worthy Keeler PA-C ?Signed: 05/09/2021 9:58:52 AM By: Carlene Coria RN ?Entered ByCarlene Coria on 05/08/2021 15:19:24 ?[name], [name]  ([id number]) ?-------------------------------------------------------------------------------- ?Problem List Details ?Patient Name: [name], [name]. ?Date of Service: 05/08/2021 2:45 PM ?Medical Record Number: [id number] ?Patient [place]

## 2021-05-10 ENCOUNTER — Telehealth: Payer: Self-pay

## 2021-05-10 NOTE — Telephone Encounter (Signed)
[name], please schedule patient for MD only next week. Please inform patient of appt. Thanks ?

## 2021-05-10 NOTE — Telephone Encounter (Signed)
-----   Message from Earlie Server, MD sent at 05/09/2021  9:24 PM EDT ----- ?He has follow up CT in April. Recent hospitalization.  ?Please schedule him to see me MD only a few days after CT.Thanks.  ?

## 2021-05-11 ENCOUNTER — Ambulatory Visit
Admission: RE | Admit: 2021-05-11 | Discharge: 2021-05-11 | Disposition: A | Discharge status: Home / Self Care | Payer: PPO | Source: Ambulatory Visit | Attending: Oncology | Admitting: Oncology

## 2021-05-11 DIAGNOSIS — C189 Malignant neoplasm of colon, unspecified: Secondary | ICD-10-CM | POA: Diagnosis not present

## 2021-05-11 DIAGNOSIS — N281 Cyst of kidney, acquired: Secondary | ICD-10-CM | POA: Diagnosis not present

## 2021-05-11 DIAGNOSIS — C187 Malignant neoplasm of sigmoid colon: Secondary | ICD-10-CM | POA: Diagnosis not present

## 2021-05-11 DIAGNOSIS — I700 Atherosclerosis of aorta: Secondary | ICD-10-CM | POA: Diagnosis not present

## 2021-05-11 DIAGNOSIS — D7389 Other diseases of spleen: Secondary | ICD-10-CM | POA: Diagnosis not present

## 2021-05-13 NOTE — Progress Notes (Signed)
Patient: [name].  DOB:  29-Aug-2045   MRN:  [id number] ? ?[name]. Is a 76 yo male patient with a PMHx of hypertension, CHF, CAD, diabetes, diabetic neuropathy, colon cancer, and  CKD IIIa.  ? ?Echo report from 09/09/19 reviewed and showed an EF of 25-30% with severely decreased LV function and global hypokinesis ? ?Presented to the ED on 04/14/21 with concerns of shingles and worsening SOB. BNP > 4,500, CXR noted with pleural effusions. Diuresed with IV lasix and planned to admit, however patient ultimately refused admission and decided to go home. ? ?He presents today for a follow-up visit with a chief complaint of minimal shortness of breath upon moderate exertion. He describes this as chronic in nature. He has associated fatigue, shingle rash (improving), pedal edema (improving) and cough (improving). He denies any difficulty sleeping, dizziness, palpitations, chest pain, abdominal distention or change in appetite.  ? ?Had diuretic increased for 3 days ~ 2 weeks ago with improvement in his cough.  ? ?Past Medical History:  ?Diagnosis Date  ? Adenocarcinoma of colon ([place]) 06/19/2013  ? a.) moderately differentiated stage IIIc (T4bN2a M0) adenocarcinoma of colon  s/p colectomy followed by chemoRx with oxaliplatin & fluorouacil.  ? Anemia   ? CAD (coronary artery disease)   ? a. 02/2015 Cath: LM nl, LAD 50/40 mid/distal, LCX 80p, RCA 40p, 30d, RPL1 40, CO 3.27, CI 1.65; b. cath 06/09/15: LM minor irregs, m-dLAD 50%, dLAD 40%, pLCx 80% s/p PCI/DES 0%, pRCA 40%, dRCA 30%, 1st RPLB 40%  ? Chronic kidney disease, stage 3a ([place])   ? Chronic systolic CHF (congestive heart failure) ([place])   ? a. 02/2015 Echo: EF 20-25%, diff HK, mod MR, mildy dil LA, mildly dil RV w/ mod RV syst dysfxn, mildly dil RA, mod TR, PASP 67mHg; b. 09/2017 Echo: EF 40-45%, diff HK. Gr1 DD. Mild to mod MR. Mildly to mod dil LA.  ? Dia hypertension 07/20/2014  ? Diabetes mellitus without complication ([place]   ? Hx of heart artery stent 06/09/2015   ? 3.0 x 12 mm Xience Alpine DES x 1 to LCx  ? Hypertension   ? Hypertensive heart disease   ? Left inguinal hernia 05/2020  ? Mixed Ischemic and Non-ischemic Cardiomyopathy   ? a.) 02/2015 EF 20-25%, diff HK; b.) 09/2016 EF 40-45%; c.) 09/2019 EF 25-30%  ? Moderate mitral regurgitation   ? a. 09/2017 Echo: Mild to mod TR.  ? Pulmonary hypertension ([place]   ? Tubular adenoma of colon   ? ?Past Surgical History:  ?Procedure Laterality Date  ? APPENDECTOMY N/A 06/19/2017  ? [facility]geon: WConsuela Mimes MD  ? CARDIAC CATHETERIZATION Bilateral 02/24/2015  ? Procedure: Right/Left Heart Cath and Coronary Angiography;  Surgeon: MWellington Hampshire MD;  [facility];  Service: Cardiovascular;  Laterality: Bilateral;  ? CARDIAC CATHETERIZATION N/A 06/09/2015  ? Procedure: Coronary Stent Intervention (3.0 x 12 mm Xience Alpine DES to LCx);  Surgeon: MWellington Hampshire MD;  [facility];  Service: Cardiovascular;  Laterality: N/A  ? COLECTOMY  06/19/2017  ? Descending and proximal sigmoid colectomy, LEFT ureterolysis, partial cecectomy, appendectomy; [facility] Surgeon: WConsuela Mimes MD  ? COLONOSCOPY    ? COLONOSCOPY WITH PROPOFOL N/A 12/11/2016  ? Procedure: COLONOSCOPY WITH PROPOFOL;  Surgeon: SLollie Sails MD;  [facility];  Service: Endoscopy;  Laterality: N/A;  ? ESOPHAGOGASTRODUODENOSCOPY    ? PORT-A-CATH REMOVAL N/A 07/12/2014  ? Procedure: REMOVAL  PORT-A-CATH;  Surgeon: Molly Maduro, MD;  Location: [facility];  Service: General;  Laterality: N/A;  ? PORTACATH PLACEMENT Left 2015  ? ?Family History  ?Problem Relation Age of Onset  ? Cancer Mother   ? ?Social History  ? ?Tobacco Use  ? Smoking status: Never  ? Smokeless tobacco: Never  ?Substance Use Topics  ? Alcohol use: No  ? ?Allergies  ?Allergen Reactions  ? Shellfish Allergy Other (See Comments)  ?  Pt. instructed by MD to avoid seafood  ? ?  ?Prior to Admission medications    ?Medication Sig Start Date End Date Taking? Authorizing Provider  ?amLODipine (NORVASC) 2.5 MG tablet Take by mouth. 10/25/20 10/25/21 Yes [provider]  ?aspirin EC 81 MG tablet Take 1 tablet (81 mg total) by mouth daily. 04/14/15  Yes Theora Gianotti, NP  ?atorvastatin (LIPITOR) 40 MG tablet TAKE 1 TABLET BY MOUTH EVERY DAY 03/11/20  Yes Wellington Hampshire, MD  ?carvedilol (COREG) 3.125 MG tablet TAKE 1 TABLET(3.125 MG) BY MOUTH TWICE DAILY 03/11/20  Yes Wellington Hampshire, MD  ?cholecalciferol (VITAMIN D3) 25 MCG (1000 UNIT) tablet Take 1,000 Units by mouth daily.   Yes [provider]  ?docusate sodium (COLACE) 100 MG capsule Take 1 capsule (100 mg total) by mouth daily as needed for mild constipation. 11/29/20  Yes Earlie Server, MD  ?ENTRESTO 24-26 MG TAKE 1 TABLET BY MOUTH TWICE DAILY 09/08/20  Yes Wellington Hampshire, MD  ?ferrous sulfate 325 (65 FE) MG EC tablet Take 1 tablet (325 mg total) by mouth 2 (two) times daily with a meal. 11/29/20  Yes Earlie Server, MD  ?furosemide (LASIX) 20 MG tablet Take 1 tablet (20 mg total) by mouth daily. 08/26/20  Yes Wellington Hampshire, MD  ?gabapentin (NEURONTIN) 100 MG capsule Take by mouth. 04/12/21 04/12/22 Yes [provider]  ?HYDROcodone-acetaminophen (NORCO/VICODIN) 5-325 MG tablet Take 1 tablet by mouth every 6 (six) hours as needed for moderate pain. 09/07/20  Yes Ronny Bacon, MD  ?ibuprofen (ADVIL) 800 MG tablet Take 1 tablet (800 mg total) by mouth every 8 (eight) hours as needed. 09/07/20  Yes Ronny Bacon, MD  ?Lidocaine 4 % GEL Apply 1 application. topically 4 (four) times daily as needed. 04/14/21  Yes Triplett, Dessa Phi, FNP  ?loratadine (CLARITIN) 10 MG tablet Take 1 tablet (10 mg total) by mouth daily. 05/02/21  Yes Leone Haven, MD  ?metFORMIN (GLUCOPHAGE) 500 MG tablet TAKE 1 TABLET(500 MG) BY MOUTH TWICE DAILY WITH A MEAL 04/03/21  Yes Leone Haven, MD  ?spironolactone (ALDACTONE) 25 MG tablet TAKE 1 TABLET(25 MG) BY MOUTH  DAILY 08/10/20  Yes Wellington Hampshire, MD  ?tamsulosin (FLOMAX) 0.4 MG CAPS capsule TAKE 1 CAPSULE(0.4 MG) BY MOUTH DAILY AFTER SUPPER ?Patient taking differently: Take 0.4 mg by mouth daily after breakfast. 04/14/19  Yes McGowan, Larene Beach A, PA-C  ?valACYclovir (VALTREX) 1000 MG tablet Take 1 tablet (1,000 mg total) by mouth 2 (two) times daily. 04/14/21  Yes Triplett, Dessa Phi, FNP  ? ? ?Review of Systems  ?Constitutional:  Positive for malaise/fatigue. Negative for weight loss.  ?HENT:  Positive for hearing loss.   ?Eyes: Negative.   ?Respiratory:  Positive for cough ("improving") and shortness of breath (with moderate exertion).   ?Cardiovascular:  Positive for leg swelling. Negative for chest pain, palpitations and orthopnea.  ?Gastrointestinal: Negative.   ?Genitourinary: Negative.   ?Musculoskeletal: Negative.  Negative for falls.  ?Skin:  Positive for rash (shingles over RU chest).  ?  Neurological:  Negative for dizziness and weakness.  ?Endo/Heme/Allergies: Negative.   ?Psychiatric/Behavioral:  Negative for depression. The patient is not nervous/anxious.    ? ?Vitals:  ? 05/15/21 1323  ?BP: 117/71  ?Pulse: 68  ?Resp: 18  ?SpO2: 100%  ?Weight: 185 lb (83.9 kg)  ?Height: '5\' 9"'$  (1.753 m)  ? ?Wt Readings from Last 3 Encounters:  ?05/15/21 185 lb (83.9 kg)  ?05/02/21 191 lb (86.6 kg)  ?04/18/21 184 lb 4 oz (83.6 kg)  ? ?Lab Results  ?Component Value Date  ? CREATININE 1.52 (H) 05/03/2021  ? CREATININE 1.70 (H) 04/14/2021  ? CREATININE 1.49 03/10/2021  ? ?Physical Exam ?Vitals and nursing note reviewed. Exam conducted with a chaperone present (wife).  ?Constitutional:   ?   General: He is not in acute distress. ?HENT:  ?   Right Ear: Decreased hearing noted.  ?   Left Ear: Decreased hearing noted.  ?   Mouth/Throat:  ?   Comments: Missing teeth and poor dentition  ?Cardiovascular:  ?   Rate and Rhythm: Normal rate and regular rhythm.  ?   Heart sounds: Normal heart sounds. No murmur heard. ?Pulmonary:  ?   Effort:  Pulmonary effort is normal.  ?   Breath sounds: No wheezing, rhonchi or rales.  ?Abdominal:  ?   Palpations: Abdomen is soft.  ?Musculoskeletal:  ?   Right lower leg: No tenderness. Edema (trace pitting) present.  ?   L

## 2021-05-15 ENCOUNTER — Encounter: Payer: PPO | Admitting: Physician Assistant

## 2021-05-15 ENCOUNTER — Ambulatory Visit: Payer: PPO | Attending: Family | Admitting: Family

## 2021-05-15 ENCOUNTER — Encounter: Payer: Self-pay | Admitting: Family

## 2021-05-15 VITALS — BP 117/71 | HR 68 | Resp 18 | Ht 69.0 in | Wt 185.0 lb

## 2021-05-15 DIAGNOSIS — E1140 Type 2 diabetes mellitus with diabetic neuropathy, unspecified: Secondary | ICD-10-CM | POA: Diagnosis not present

## 2021-05-15 DIAGNOSIS — E1122 Type 2 diabetes mellitus with diabetic chronic kidney disease: Secondary | ICD-10-CM | POA: Insufficient documentation

## 2021-05-15 DIAGNOSIS — I2510 Atherosclerotic heart disease of native coronary artery without angina pectoris: Secondary | ICD-10-CM | POA: Diagnosis not present

## 2021-05-15 DIAGNOSIS — I5022 Chronic systolic (congestive) heart failure: Secondary | ICD-10-CM | POA: Insufficient documentation

## 2021-05-15 DIAGNOSIS — L97222 Non-pressure chronic ulcer of left calf with fat layer exposed: Secondary | ICD-10-CM | POA: Diagnosis not present

## 2021-05-15 DIAGNOSIS — Z79899 Other long term (current) drug therapy: Secondary | ICD-10-CM | POA: Diagnosis not present

## 2021-05-15 DIAGNOSIS — I130 Hypertensive heart and chronic kidney disease with heart failure and stage 1 through stage 4 chronic kidney disease, or unspecified chronic kidney disease: Secondary | ICD-10-CM | POA: Insufficient documentation

## 2021-05-15 DIAGNOSIS — N1832 Chronic kidney disease, stage 3b: Secondary | ICD-10-CM

## 2021-05-15 DIAGNOSIS — Z7902 Long term (current) use of antithrombotics/antiplatelets: Secondary | ICD-10-CM | POA: Diagnosis not present

## 2021-05-15 DIAGNOSIS — N1831 Chronic kidney disease, stage 3a: Secondary | ICD-10-CM | POA: Diagnosis not present

## 2021-05-15 DIAGNOSIS — B029 Zoster without complications: Secondary | ICD-10-CM | POA: Insufficient documentation

## 2021-05-15 DIAGNOSIS — I10 Essential (primary) hypertension: Secondary | ICD-10-CM

## 2021-05-15 DIAGNOSIS — E11621 Type 2 diabetes mellitus with foot ulcer: Secondary | ICD-10-CM | POA: Diagnosis not present

## 2021-05-15 NOTE — Progress Notes (Addendum)
[name] ([id number]) ?Visit Report for 05/15/2021 ?Arrival Information Details ?Patient Name: [name], [name]. ?Date of Service: 05/15/2021 2:45 PM ?Medical Record Number: [id number] ?Patient Account Number: [id number] ?Date of Birth/Sex: [birth year] (76 y.o. M) ?Treating RN: [name] ?Primary Care [name]: [name] Other Clinician: ?Referring [name]: [name] ?Treating [name]/Extender: [name] ?Weeks in Treatment: 7 ?Visit Information History Since Last Visit ?All ordered tests and consults were completed: No ?Patient Arrived: [name] ?Added or deleted any medications: No ?Arrival Time: 15:09 ?Any new allergies or adverse reactions: No ?Accompanied By: self ?Had a fall or experienced change in No ?Transfer Assistance: None ?activities of daily living that may affect ?Patient Identification Verified: Yes ?risk of falls: ?Secondary Verification Process Completed: Yes ?Signs or symptoms of abuse/neglect since last visito No ?Patient Requires Transmission-Based Precautions: No ?Hospitalized since last visit: No ?Patient Has Alerts: No ?Implantable device outside of the clinic excluding No ?cellular tissue based products placed in the center ?since last visit: ?Has Dressing in Place as Prescribed: Yes ?Has Compression in Place as Prescribed: Yes ?Pain Present Now: No ?Electronic Signature(s) ?Signed: 05/16/2021 8:57:31 AM By: Christopher Coria RN ?Entered By: [name] on 05/15/2021 15:13:23 ?[name], [name] ([id number]) ?-------------------------------------------------------------------------------- ?Clinic Level of Care Assessment Details ?Patient Name: [name], [name]. ?Date of Service: 05/15/2021 2:45 PM ?Medical Record Number: [id number] ?Patient Account Number: [id number] ?Date of Birth/Sex: 1945/12/04 (76 y.o. M) ?Treating RN: [name] ?Primary Care [name]: [name] Other Clinician: ?Referring [name]: [name] ?Treating [name]/Extender: [name] ?Weeks in  Treatment: 7 ?Clinic Level of Care Assessment Items ?TOOL 4 Quantity Score ?X - Use when only an EandM is performed on FOLLOW-UP visit 1 0 ?ASSESSMENTS - Nursing Assessment / Reassessment ?X - Reassessment of Co-morbidities (includes updates in patient status) 1 10 ?X- 1 5 ?Reassessment of Adherence to Treatment Plan ?ASSESSMENTS - Wound and Skin Assessment / Reassessment ?X - Simple Wound Assessment / Reassessment - one wound 1 5 ?'[]'$  - 0 ?Complex Wound Assessment / Reassessment - multiple wounds ?'[]'$  - 0 ?Dermatologic / Skin Assessment (not related to wound area) ?ASSESSMENTS - Focused Assessment ?'[]'$  - Circumferential Edema Measurements - multi extremities 0 ?'[]'$  - 0 ?Nutritional Assessment / Counseling / Intervention ?'[]'$  - 0 ?Lower Extremity Assessment (monofilament, tuning fork, pulses) ?'[]'$  - 0 ?Peripheral Arterial Disease Assessment (using hand held doppler) ?ASSESSMENTS - Ostomy and/or Continence Assessment and Care ?'[]'$  - Incontinence Assessment and Management 0 ?'[]'$  - 0 ?Ostomy Care Assessment and Management (repouching, etc.) ?PROCESS - Coordination of Care ?X - Simple Patient / Family Education for ongoing care 1 15 ?'[]'$  - 0 ?Complex (extensive) Patient / Family Education for ongoing care ?'[]'$  - 0 ?Staff obtains Consents, Records, Test Results / Process Orders ?'[]'$  - 0 ?Staff telephones HHA, Nursing Homes / Clarify orders / etc ?'[]'$  - 0 ?Routine Transfer to another Facility (non-emergent condition) ?'[]'$  - 0 ?Routine Hospital Admission (non-emergent condition) ?'[]'$  - 0 ?New Admissions / Biomedical engineer / Ordering NPWT, Apligraf, etc. ?'[]'$  - 0 ?Emergency Hospital Admission (emergent condition) ?X- 1 10 ?Simple Discharge Coordination ?'[]'$  - 0 ?Complex (extensive) Discharge Coordination ?PROCESS - Special Needs ?'[]'$  - Pediatric / Minor Patient Management 0 ?'[]'$  - 0 ?Isolation Patient Management ?'[]'$  - 0 ?Hearing / Language / Visual special needs ?'[]'$  - 0 ?Assessment of Community assistance (transportation, D/C  planning, etc.) ?'[]'$  - 0 ?Additional assistance / Altered mentation ?'[]'$  - 0 ?Support Surface(s) Assessment (bed, cushion, seat, etc.) ?INTERVENTIONS - Wound Cleansing / Measurement ?[name] ([id number]) ?  X- 1 5 ?Simple Wound Cleansing - one wound ?'[]'$  - 0 ?Complex Wound Cleansing - multiple wounds ?X- 1 5 ?Wound Imaging (photographs - any number of wounds) ?'[]'$  - 0 ?Wound Tracing (instead of photographs) ?X- 1 5 ?Simple Wound Measurement - one wound ?'[]'$  - 0 ?Complex Wound Measurement - multiple wounds ?INTERVENTIONS - Wound Dressings ?X - Small Wound Dressing one or multiple wounds 1 10 ?'[]'$  - 0 ?Medium Wound Dressing one or multiple wounds ?'[]'$  - 0 ?Large Wound Dressing one or multiple wounds ?'[]'$  - 0 ?Application of Medications - topical ?'[]'$  - 0 ?Application of Medications - injection ?INTERVENTIONS - Miscellaneous ?'[]'$  - External ear exam 0 ?'[]'$  - 0 ?Specimen Collection (cultures, biopsies, blood, body fluids, etc.) ?'[]'$  - 0 ?Specimen(s) / Culture(s) sent or taken to Lab for analysis ?'[]'$  - 0 ?Patient Transfer (multiple staff / Civil Service fast streamer / Similar devices) ?'[]'$  - 0 ?Simple Staple / Suture removal (25 or less) ?'[]'$  - 0 ?Complex Staple / Suture removal (26 or more) ?'[]'$  - 0 ?Hypo / Hyperglycemic Management (close monitor of Blood Glucose) ?'[]'$  - 0 ?Ankle / Brachial Index (ABI) - do not check if billed separately ?X- 1 5 ?Vital Signs ?Has the patient been seen at the hospital within the last three years: Yes ?Total Score: 75 ?Level Of Care: New/Established - Level ?2 ?Electronic Signature(s) ?Signed: 05/16/2021 8:57:31 AM By: Christopher Coria RN ?Entered By: [name] on 05/15/2021 16:03:24 ?[name], [name] ([id number]) ?-------------------------------------------------------------------------------- ?Encounter Discharge Information Details ?Patient Name: [name], [name]. ?Date of Service: 05/15/2021 2:45 PM ?Medical Record Number: [id number] ?Patient Account Number: [id number] ?Date of Birth/Sex: January 24, 1946 (76 y.o.  M) ?Treating RN: [name] ?Primary Care Briyah Wheelwright: [name] Other Clinician: ?Referring Israella Hubert: [name] ?Treating Viktoria Gruetzmacher/Extender: [name] ?Weeks in Treatment: 7 ?Encounter Discharge Information Items ?Discharge Condition: Stable ?Ambulatory Status: [name] ?Discharge Destination: Home ?Transportation: Private Auto ?Accompanied By: self ?Schedule Follow-up Appointment: Yes ?Clinical Summary of Care: Patient Declined ?Electronic Signature(s) ?Signed: 05/15/2021 4:04:37 PM By: Christopher Coria RN ?Entered By: [name] on 05/15/2021 16:04:36 ?[name], [name] ([id number]) ?-------------------------------------------------------------------------------- ?Lower Extremity Assessment Details ?Patient Name: [name], [name]. ?Date of Service: 05/15/2021 2:45 PM ?Medical Record Number: [id number] ?Patient Account Number: [id number] ?Date of Birth/Sex: [birth year] (76 y.o. M) ?Treating RN: [name] ?Primary Care Rosan Calbert: [name] Other Clinician: ?Referring Sarai January: [name] ?Treating Elisabet Gutzmer/Extender: [name] ?Weeks in Treatment: 7 ?Edema Assessment ?Assessed: [Left: No] [Right: No] ?Edema: [Left: N] [Right: o] ?Calf ?Left: Right: ?Point of Measurement: 35 cm From Medial Instep 30 cm ?Ankle ?Left: Right: ?Point of Measurement: 10 cm From Medial Instep 23 cm ?Vascular Assessment ?Pulses: ?Dorsalis Pedis ?Palpable: [Left:Yes] ?Electronic Signature(s) ?Signed: 05/16/2021 8:57:31 AM By: Christopher Coria RN ?Entered By: [name] on 05/15/2021 15:18:01 ?[name], [name] ([id number]) ?-------------------------------------------------------------------------------- ?Multi Wound Chart Details ?Patient Name: [name], [name]. ?Date of Service: 05/15/2021 2:45 PM ?Medical Record Number: [id number] ?Patient Account Number: [id number] ?Date of Birth/Sex: Jul 04, 1945 (76 y.o. M) ?Treating RN: [name] ?Primary Care Zykerria Tanton: [name] Other Clinician: ?Referring Deriona Altemose: [name] ?Treating Zareth Rippetoe/Extender: [name] ?Weeks in Treatment: 7 ?Vital Signs ?Height(in): ?Pulse(bpm): 97 ?Weight(lbs): 183 ?Blood Pressure(mmHg): 124/75 ?Body Mass Index(BMI): ?Temperature(??F): 98 ?Respiratory Rate(breaths/min): 1

## 2021-05-15 NOTE — Patient Instructions (Addendum)
Begin weighing daily and call for an overnight weight gain of 3 pounds or more or a weekly weight gain of more than 5 pounds.   If you have voicemail, please make sure your mailbox is cleaned out so that we may leave a message and please make sure to listen to any voicemails.     

## 2021-05-15 NOTE — Progress Notes (Addendum)
[name], [name] ([id number]) ?Visit Report for 05/15/2021 ?Chief Complaint Document Details ?Patient Name: [name], [name]. ?Date of Service: 05/15/2021 2:45 PM ?Medical Record Number: [id number] ?Patient Account Number: [id number] ?Date of Birth/Sex: September 06, 1945 (76 y.o. M) ?Treating RN: Carlene Coria ?Primary Care Provider: Tommi Rumps Other Clinician: ?Referring Provider: Tommi Rumps ?Treating Provider/Extender: Jeri Cos ?Weeks in Treatment: 7 ?Information Obtained from: Patient ?Chief Complaint ?Left leg ulcer ?Electronic Signature(s) ?Signed: 05/15/2021 2:37:57 PM By: Worthy Keeler PA-C ?Entered By: Worthy Keeler on 05/15/2021 14:37:57 ?[name], [name] ([id number]) ?-------------------------------------------------------------------------------- ?HPI Details ?Patient Name: [name], [name]. ?Date of Service: 05/15/2021 2:45 PM ?Medical Record Number: [id number] ?Patient Account Number: [id number] ?Date of Birth/Sex: 05-Nov-1945 (76 y.o. M) ?Treating RN: Carlene Coria ?Primary Care Provider: Tommi Rumps Other Clinician: ?Referring Provider: Tommi Rumps ?Treating Provider/Extender: Jeri Cos ?Weeks in Treatment: 7 ?History of Present Illness ?HPI Description: 03/27/2021 upon evaluation today patient appears to be doing well with regard to his wound. This was actually an issue that he ?has had since November 2022. He does have evidence of leg swelling and even early signs of lymphedema. Subsequently he tells me that his ?daughter has been putting some "green-cream" on this. I am not sure what that is but nonetheless he does seem to have a lot of healing ?compared to what he had previous. Fortunately there does not appear to be any signs of active infection locally nor systemically at this time which ?is great news. Honestly as dry as his leg is and even the wound bed I think that Xeroform gauze would probably be an awesome option for him to ?try to help with getting this area to heal  appropriately. ?Patient does have a history of diabetes mellitus type 2, chronic venous insufficiency, hypertension, chronic kidney disease stage III, congestive ?heart failure, and coronary artery disease. ?04/03/2021 upon evaluation today patient's legs actually appear to be doing great his left leg where the wounds are is actually measuring a bit ?smaller and the wounds themselves are definitely smaller. I am actually extremely pleased with where we stand today and I see no signs of active ?infection locally or systemically at this time which is great news. No fevers, chills, nausea, vomiting, or diarrhea. ?04/10/2021 upon evaluation today patient's wounds are actually showing signs of improvement both anterior and posterior. I think that he is making ?excellent progress and overall I see no need for any major changes here at this point. ?3/13; patient presents for follow-up. He has no issues or complaints today. He reports developing shingles to his chest with some open draining ?vesicles. He does have some scabbed over lesions. He saw his primary care provider for this issue. He was started on medication. His daughter ?has been doing the dressing changing to his left leg. He has no issues or complaints about the leg wound. ?04/24/2021 upon evaluation today patient appears to be doing well with regard to his leg ulcers. Both are showing signs of improvement with new ?epithelial growth which is great news. Overall very pleased in that regard. ?05/01/2021 upon evaluation today patient appears to be doing well with regard to his wound. He has been tolerating the dressing changes without ?complication. Fortunately I do not see any evidence of active infection locally nor systemically which is great news. No fevers, chills, nausea, ?vomiting, or diarrhea. ?05/08/2021 upon evaluation today patient's anterior leg actually appears to be doing quite well. I am very pleased in that regard. I do not see any ?signs of active  infection at this time which is great news. ?05-15-2021 upon evaluation today patient's wound on the anterior leg is still healed the posterior is looking better though not significantly smaller ?yet we just switch to collagen last week. Overall I think we definitely want to give this a little bit more time before making any major adjustments ?in my opinion. ?Electronic Signature(s) ?Signed: 05/15/2021 4:06:35 PM By: Worthy Keeler PA-C ?Entered By: Worthy Keeler on 05/15/2021 16:06:35 ?[name], [name] ([id number]) ?-------------------------------------------------------------------------------- ?Physical Exam Details ?Patient Name: [name], [name]. ?Date of Service: 05/15/2021 2:45 PM ?Medical Record Number: [id number] ?Patient Account Number: [id number] ?Date of Birth/Sex: 1945-10-03 (76 y.o. M) ?Treating RN: Carlene Coria ?Primary Care Provider: Tommi Rumps Other Clinician: ?Referring Provider: Tommi Rumps ?Treating Provider/Extender: Jeri Cos ?Weeks in Treatment: 7 ?Constitutional ?Well-nourished and well-hydrated in no acute distress. ?Respiratory ?normal breathing without difficulty. ?Psychiatric ?this patient is able to make decisions and demonstrates good insight into disease process. Alert and Oriented x 3. pleasant and cooperative. ?Notes ?Upon inspection patient's wound bed showed evidence of good granulation and epithelization at this point. Fortunately I do not see any evidence ?of active infection locally nor systemically which is great news and overall I think we are headed in the right direction. ?Electronic Signature(s) ?Signed: 05/15/2021 4:06:52 PM By: Worthy Keeler PA-C ?Entered By: Worthy Keeler on 05/15/2021 16:06:52 ?[name], [name] ([id number]) ?-------------------------------------------------------------------------------- ?Physician Orders Details ?Patient Name: [name], [name]. ?Date of Service: 05/15/2021 2:45 PM ?Medical Record Number: [id number] ?Patient Account Number:  [id number] ?Date of Birth/Sex: 1945/07/16 (76 y.o. M) ?Treating RN: Carlene Coria ?Primary Care Provider: Tommi Rumps Other Clinician: ?Referring Provider: Tommi Rumps ?Treating Provider/Extender: Jeri Cos ?Weeks in Treatment: 7 ?Verbal / Phone Orders: No ?Diagnosis Coding ?ICD-10 Coding ?Code Description ?E11.622 Type 2 diabetes mellitus with other skin ulcer ?I87.2 Venous insufficiency (chronic) (peripheral) ?K93.267 Non-pressure chronic ulcer of other part of left lower leg with fat layer exposed ?I10 Essential (primary) hypertension ?N18.30 Chronic kidney disease, stage 3 unspecified ?I50.42 Chronic combined systolic (congestive) and diastolic (congestive) heart failure ?I25.10 Atherosclerotic heart disease of native coronary artery without angina pectoris ?Follow-up Appointments ?o Return Appointment in 1 week. ?Bathing/ Shower/ Hygiene ?o May shower; gently cleanse wound with antibacterial soap, rinse and pat dry prior to dressing wounds ?Edema Control - Lymphedema / Segmental Compressive Device / Other ?o Tubigrip single layer applied. - side D - left leg ?o Elevate, Exercise Daily and Avoid Standing for Long Periods of Time. ?o Elevate legs to the level of the heart and pump ankles as often as possible ?o Elevate leg(s) parallel to the floor when sitting. ?Wound Treatment ?Wound #2 - Lower Leg Wound Laterality: Left, Posterior ?Cleanser: Soap and Water 3 x Per Week/30 Days ?Discharge Instructions: Gently cleanse wound with antibacterial soap, rinse and pat dry prior to dressing wounds ?Primary Dressing: Prisma 4.34 (in) 3 x Per Week/30 Days ?Discharge Instructions: Moisten w/normal saline or sterile water; Cover wound as directed. Do not remove from wound bed. ?Secondary Dressing: ABD Pad 5x9 (in/in) 3 x Per Week/30 Days ?Discharge Instructions: Cover with ABD pad ?Secured With: Medipore Tape - 14M Medipore H Soft Cloth Surgical Tape, 2x2 (in/yd) 3 x Per Week/30 Days ?Secured With:  [employer] or Non-Sterile 6-ply 4.5x4 (yd/yd) 3 x Per Week/30 Days ?Discharge Instructions: Apply Kerlix as directed ?Compression Wrap: coban 3 x Per Week/30 Days ?Electronic Signature(s) ?Signed: 05/15/2021 4:48:03 PM By: Joaquim Lai

## 2021-05-16 ENCOUNTER — Encounter: Payer: Self-pay | Admitting: Family Medicine

## 2021-05-16 ENCOUNTER — Ambulatory Visit (INDEPENDENT_AMBULATORY_CARE_PROVIDER_SITE_OTHER): Payer: PPO | Admitting: Family Medicine

## 2021-05-16 DIAGNOSIS — Z85038 Personal history of other malignant neoplasm of large intestine: Secondary | ICD-10-CM | POA: Diagnosis not present

## 2021-05-16 DIAGNOSIS — B029 Zoster without complications: Secondary | ICD-10-CM | POA: Diagnosis not present

## 2021-05-16 DIAGNOSIS — I5023 Acute on chronic systolic (congestive) heart failure: Secondary | ICD-10-CM | POA: Diagnosis not present

## 2021-05-16 DIAGNOSIS — K219 Gastro-esophageal reflux disease without esophagitis: Secondary | ICD-10-CM | POA: Diagnosis not present

## 2021-05-16 NOTE — Assessment & Plan Note (Signed)
Much improved.  He will continue Lasix 20 mg twice daily.  I advised that he needs to weigh himself daily and if he gains 2 or more pounds overnight or more than 5 pounds in a week he needs to let us or the heart failure clinic know.  We will follow-up with him in 3 months.  If he develops any recurrent or worsening symptoms he will let us know. ?

## 2021-05-16 NOTE — Progress Notes (Signed)
?Tommi Rumps, MD ?Phone: [phone] ? ?[name]. is a 76 y.o. male who presents today for follow-up. ? ?Shingles: Patient notes this complaint continues to improve.  The itching has mostly resolved.  He has been taking loratadine to help with the itching.  The rash is significantly better. ? ?CHF: Patient notes his cough has essentially gone away.  He has rare stable shortness of breath.  He notes occasional orthopnea and is sleeping on 2 pillows.  He notes no edema.  No PND.  He saw the heart failure clinic yesterday and they advised him to continue on his current medications at this time. ? ?Social History  ? ?Tobacco Use  ?Smoking Status Never  ?Smokeless Tobacco Never  ? ? ?Current Outpatient Medications on File Prior to Visit  ?Medication Sig Dispense Refill  ? amLODipine (NORVASC) 2.5 MG tablet Take by mouth.    ? aspirin EC 81 MG tablet Take 1 tablet (81 mg total) by mouth daily. 90 tablet 3  ? atorvastatin (LIPITOR) 40 MG tablet TAKE 1 TABLET BY MOUTH EVERY DAY 90 tablet 3  ? carvedilol (COREG) 3.125 MG tablet TAKE 1 TABLET(3.125 MG) BY MOUTH TWICE DAILY 180 tablet 3  ? cholecalciferol (VITAMIN D3) 25 MCG (1000 UNIT) tablet Take 1,000 Units by mouth daily.    ? docusate sodium (COLACE) 100 MG capsule Take 1 capsule (100 mg total) by mouth daily as needed for mild constipation. 30 capsule 5  ? ENTRESTO 24-26 MG TAKE 1 TABLET BY MOUTH TWICE DAILY 60 tablet 5  ? ferrous sulfate 325 (65 FE) MG EC tablet Take 1 tablet (325 mg total) by mouth 2 (two) times daily with a meal. 60 tablet 4  ? furosemide (LASIX) 20 MG tablet Take 1 tablet (20 mg total) by mouth daily. 30 tablet 6  ? gabapentin (NEURONTIN) 100 MG capsule Take by mouth.    ? HYDROcodone-acetaminophen (NORCO/VICODIN) 5-325 MG tablet Take 1 tablet by mouth every 6 (six) hours as needed for moderate pain. 15 tablet 0  ? ibuprofen (ADVIL) 800 MG tablet Take 1 tablet (800 mg total) by mouth every 8 (eight) hours as needed. 30 tablet 0  ?  Lidocaine 4 % GEL Apply 1 application. topically 4 (four) times daily as needed. 113 g 0  ? loratadine (CLARITIN) 10 MG tablet Take 1 tablet (10 mg total) by mouth daily. 30 tablet 11  ? metFORMIN (GLUCOPHAGE) 500 MG tablet TAKE 1 TABLET(500 MG) BY MOUTH TWICE DAILY WITH A MEAL 180 tablet 3  ? spironolactone (ALDACTONE) 25 MG tablet TAKE 1 TABLET(25 MG) BY MOUTH DAILY 30 tablet 0  ? tamsulosin (FLOMAX) 0.4 MG CAPS capsule TAKE 1 CAPSULE(0.4 MG) BY MOUTH DAILY AFTER SUPPER (Patient taking differently: Take 0.4 mg by mouth daily after breakfast.) 30 capsule 11  ? valACYclovir (VALTREX) 1000 MG tablet Take 1 tablet (1,000 mg total) by mouth 2 (two) times daily. 21 tablet 0  ? ?No current facility-administered medications on file prior to visit.  ? ? ? ?ROS see history of present illness ? ?Objective ? ?Physical Exam ?Vitals:  ? 05/16/21 1127  ?BP: 130/70  ?Pulse: (!) 102  ?Temp: 98.4 ?F (36.9 ?C)  ?SpO2: 99%  ? ? ?BP Readings from Last 3 Encounters:  ?05/16/21 130/70  ?05/15/21 117/71  ?05/02/21 118/70  ? ?Wt Readings from Last 3 Encounters:  ?05/16/21 185 lb 3.2 oz (84 kg)  ?05/15/21 185 lb (83.9 kg)  ?05/02/21 191 lb (86.6 kg)  ? ? ?Physical Exam ?  Constitutional:   ?   General: He is not in acute distress. ?   Appearance: He is not diaphoretic.  ?Cardiovascular:  ?   Rate and Rhythm: Normal rate and regular rhythm.  ?   Heart sounds: Normal heart sounds.  ?Pulmonary:  ?   Effort: Pulmonary effort is normal.  ?   Breath sounds: Normal breath sounds.  ?Skin: ?   General: Skin is dry.  ?   Comments: Significantly improved rash over his right chest  ?Neurological:  ?   Mental Status: He is alert.  ? ? ? ?Assessment/Plan: Please see individual problem list. ? ?Problem List Items Addressed This Visit   ? ? Acute on chronic systolic CHF (congestive heart failure) ([place])  ?  Much improved.  He will continue Lasix 20 mg twice daily.  I advised that he needs to weigh himself daily and if he gains 2 or more pounds overnight or  more than 5 pounds in a week he needs to let us or the heart failure clinic know.  We will follow-up with him in 3 months.  If he develops any recurrent or worsening symptoms he will let us know. ?  ?  ? Shingles  ?  Significantly improved.  I advised that he can get the Shingrix vaccine once his rash completely heals.  Discussed he should check with the pharmacy on the cost prior to getting the vaccine though I did advise his insurance should cover this. ?  ?  ? ? ? ?Return in about 3 months (around 08/15/2021). ? ?This visit occurred during the SARS-CoV-2 public health emergency.  Safety protocols were in place, including screening questions prior to the visit, additional usage of staff PPE, and extensive cleaning of exam room while observing appropriate contact time as indicated for disinfecting solutions.  ? ? ?Tommi Rumps, MD ?[place] ? ?

## 2021-05-16 NOTE — Patient Instructions (Signed)
Nice to see you. ?Please try to weigh yourself every day.  If you gain 2 or more pounds overnight or if you gain more than 5 pounds in a week you need to let us or your heart failure team know. ?Please consider getting the Shingrix vaccine once your shingles rash completely heals up. ?

## 2021-05-16 NOTE — Assessment & Plan Note (Signed)
Significantly improved.  I advised that he can get the Shingrix vaccine once his rash completely heals.  Discussed he should check with the pharmacy on the cost prior to getting the vaccine though I did advise his insurance should cover this. ?

## 2021-05-17 ENCOUNTER — Other Ambulatory Visit: Payer: Self-pay | Admitting: Cardiovascular Disease

## 2021-05-17 ENCOUNTER — Telehealth: Payer: Self-pay | Admitting: Cardiovascular Disease

## 2021-05-17 NOTE — Telephone Encounter (Signed)
? ?  Pre-operative Risk Assessment  ?  ?Patient Name: [name].  ?DOB: 10/27/1945 ?MRN: [id number]  ? ?  ? ?Request for Surgical Clearance   ? ?Procedure:   colonoscopy ? ?Date of Surgery:  Clearance 07/13/21                              ?   ?Surgeon:  Haig Prophet ?Surgeon's Group or Practice Name:  [facility]  ?Phone number:  [phone] ?Fax number:  [phone] ?  ?Type of Clearance Requested:   ?-Medical ?- Pharmaceutical - plavix please advise ?  ?Type of Anesthesia:  Not Indicated ?  ?Additional requests/questions:   ? ?Signed, ?Clarisse Gouge   ?05/17/2021, 10:39 AM  ? ?

## 2021-05-17 NOTE — Telephone Encounter (Signed)
Patient has an appointment with Ignacia Bayley, NP on 05/25/21. Will route clearance to NP for his awareness to address at office visit.  ?

## 2021-05-18 ENCOUNTER — Inpatient Hospital Stay: Payer: PPO | Attending: Oncology | Admitting: Oncology

## 2021-05-18 ENCOUNTER — Encounter: Payer: Self-pay | Admitting: Oncology

## 2021-05-18 VITALS — BP 145/93 | HR 101 | Temp 96.6°F | Wt 185.0 lb

## 2021-05-18 DIAGNOSIS — D696 Thrombocytopenia, unspecified: Secondary | ICD-10-CM | POA: Insufficient documentation

## 2021-05-18 DIAGNOSIS — Z85038 Personal history of other malignant neoplasm of large intestine: Secondary | ICD-10-CM | POA: Insufficient documentation

## 2021-05-18 DIAGNOSIS — I5022 Chronic systolic (congestive) heart failure: Secondary | ICD-10-CM | POA: Insufficient documentation

## 2021-05-18 DIAGNOSIS — Z79899 Other long term (current) drug therapy: Secondary | ICD-10-CM | POA: Diagnosis not present

## 2021-05-18 DIAGNOSIS — R0602 Shortness of breath: Secondary | ICD-10-CM | POA: Diagnosis not present

## 2021-05-18 DIAGNOSIS — Z7982 Long term (current) use of aspirin: Secondary | ICD-10-CM | POA: Insufficient documentation

## 2021-05-18 DIAGNOSIS — Z7984 Long term (current) use of oral hypoglycemic drugs: Secondary | ICD-10-CM | POA: Insufficient documentation

## 2021-05-18 DIAGNOSIS — N1831 Chronic kidney disease, stage 3a: Secondary | ICD-10-CM | POA: Insufficient documentation

## 2021-05-18 DIAGNOSIS — E538 Deficiency of other specified B group vitamins: Secondary | ICD-10-CM | POA: Diagnosis not present

## 2021-05-18 DIAGNOSIS — C187 Malignant neoplasm of sigmoid colon: Secondary | ICD-10-CM

## 2021-05-18 DIAGNOSIS — I130 Hypertensive heart and chronic kidney disease with heart failure and stage 1 through stage 4 chronic kidney disease, or unspecified chronic kidney disease: Secondary | ICD-10-CM | POA: Insufficient documentation

## 2021-05-18 DIAGNOSIS — D631 Anemia in chronic kidney disease: Secondary | ICD-10-CM | POA: Insufficient documentation

## 2021-05-18 NOTE — Progress Notes (Signed)
[place] ? ?Chief Complaint: [name]. is a 76 y.o. male with a history of stage IIIC colon cancer, pancytopenia, and B12 deficiency who presented to follow-up for management of history of colon cancer and vitamin B12 deficiency.  ? ?PERTINENT ONCOLOGY HISTORY ?Patient previous oncology care was with Dr. Mike Gip who has moved practice to [facility] ?Patient switches care to me on 02/10/2018 ? ?# stage IIIC colon cancer.  ?He presented with symptomatic anemia.  He underwent descending and proximal sigmoid colectomy, left ureteral lysis, and partial cecectomy on 06/23/2013. Six of 22 lymph nodes were positive.  There were tumor deposits (discontinuous extramural extension).  There was lymphovascular and perineural invasion. Pathologic stage was IIIC (T4bN2a M0). ?He received FOLFOX chemotherapy from 07/22/2013 until 01/06/2014.  ?  ?Chest, abdomen, and pelvic CT on 01/12/2016 revealed no evidence of recurrent or metastatic carcinoma.  Abdomen and pelvic CT with and without contrast on 09/13/2017 revealed no multiple simple renal cysts.  Liver was unremarkable. ? ?Colonoscopy on 04/12/2014 revealed several polyps.  There was no dysplasia or malignancy.  Colonoscopy on 12/11/2016 revealed 10 polyps (2-8 mm) in the ascending proximal transverse, descending, proximal sigmoid and rectum.  Pathology revealed multiple tubular adenomas without dysplasia or malignancy. ? ?He was diagnosed with pyelonephritis.  He is undergoing evaluation for hematuria.  CT scan was negative.  Cystoscopy on 10/03/2017 revealed prostate enlargement and no evidence of bladder pathology. ? ?# He has a mild pancytopenia.  He received a course of Septra (08/26/2017 - 09/02/2017).  He has a history of a mild normocytic anemia.  Diet is modest.  He denies herbal products.  He denies any melena or hematochezia. ? ?Work-up on 09/27/2017 revealed a hematocrit 31.1, hemoglobin 10.6, MCV 94, platelets 141,000, WBC  2700 with an ANC of 1600.  Creatinine was 1.32.  He has B12 deficiency.  B12 was low (149).  Anti-parietal cell antibody and intrinsic factor antibody were negative.   Negative studies included:  folate (12.3), TSH, hepatitis B core antibody total, and hepatitis C antibody. Retic was 1.8%.  Ferritin was 103 with a iron saturation of 25% and a TIBC of 201.   Creatinine was 1.32. ? ?He has B12 deficiency.  B12 was 149 on 09/27/2017 then 322 on 11/04/2017 and 227 on 12/05/2017 on oral B12. ? ?Echocardiogram revealed an EF was 20-25% on 02/18/2015.  Cardiac catheterization on 02/24/2015 revealed 80% occlusion of the proximal left circumflex.  He managed medically. ? ?09/07/2020 patient had bilateral inguinal hernia repair with mesh.  By Dr. Christian Mate ?09/20/2020, CT chest abdomen pelvis for colon cancer follow-up showed no definitive signs of new/progressive findings ?Large collection containing gas on the left inguinal canal.  Scattered fluid and gas suspected hematoma in the pelvis and right and left iliac fossa.  Extraperitoneal thickening along the lower left quadrant adjacent to small bowel loops suggesting small hematoma or mass.  Low-density foci in the spleen.  Consider short-term follow-up 3 to 6 months to ensure stability.  Tiny anterior right upper lobe pulmonary nodule unchanged.  Three-vessel coronary artery disease.  Aortic atherosclerosis. ?Patient was followed up by Dr. Atha Starks.  He was found to have a primary large central hernia of the left groin and scrotum consistent with a postoperative hydrocele.  Patient declined urology evaluation for drainage. ? ?INTERVAL HISTORY ?[name]. is a 76 y.o. male who has above history reviewed by me today presents for follow up visit for management of history of  colon cancer and vitamin B12 deficiency ?Patient has had worsening of shortness of breath with exertion ?Marland Kitchen  He was recently seen by primary care provider and heart failure clinic. ?Denies  unintentional weight loss, fever, night sweats, blood in the stool, abdominal pain. ?Past Medical History:  ?Diagnosis Date  ? Adenocarcinoma of colon ([place]) 06/19/2013  ? a.) moderately differentiated stage IIIc (T4bN2a M0) adenocarcinoma of colon  s/p colectomy followed by chemoRx with oxaliplatin & fluorouacil.  ? Anemia   ? CAD (coronary artery disease)   ? a. 02/2015 Cath: LM nl, LAD 50/40 mid/distal, LCX 80p, RCA 40p, 30d, RPL1 40, CO 3.27, CI 1.65; b. cath 06/09/15: LM minor irregs, m-dLAD 50%, dLAD 40%, pLCx 80% s/p PCI/DES 0%, pRCA 40%, dRCA 30%, 1st RPLB 40%  ? Chronic kidney disease, stage 3a ([place])   ? Chronic systolic CHF (congestive heart failure) ([place])   ? a. 02/2015 Echo: EF 20-25%, diff HK, mod MR, mildy dil LA, mildly dil RV w/ mod RV syst dysfxn, mildly dil RA, mod TR, PASP 62mHg; b. 09/2017 Echo: EF 40-45%, diff HK. Gr1 DD. Mild to mod MR. Mildly to mod dil LA.  ? Dia hypertension 07/20/2014  ? [place] mellitus [place] complication [place]   ? Hx of heart artery stent 06/09/2015  ? 3.0 x 12 mm Xience Alpine DES x 1 to LCx  ? Hypertension   ? Hypertensive heart disease   ? Left inguinal hernia 05/2020  ? Mixed Ischemic and Non-ischemic Cardiomyopathy   ? a.) 02/2015 EF 20-25%, diff HK; b.) 09/2016 EF 40-45%; c.) 09/2019 EF 25-30%  ? Moderate mitral regurgitation   ? a. 09/2017 Echo: Mild to mod TR.  ? [place]monary [place] ([place]   ? Tubular adenoma of colon   ? ? ?Past Surgical History:  ?Procedure Laterality Date  ? APPENDECTOMY N/A 06/19/2017  ? [place] [facility]: WConsuela Mimes MD  ? CARDIAC CATHETERIZATION Bilateral 02/24/2015  ? Procedure: Right/Left Heart Cath and Coronary Angiography;  Surgeon: MWellington Hampshire MD;  [facility];  Service: Cardiovascular;  Laterality: Bilateral;  ? CARDIAC CATHETERIZATION N/A 06/09/2015  ? Procedure: Coronary Stent Intervention (3.0 x 12 mm Xience Alpine DES to LCx);  Surgeon: MWellington Hampshire MD;  [facility];   Service: Cardiovascular;  Laterality: N/A  ? COLECTOMY  06/19/2017  ? Descending and proximal sigmoid colectomy, LEFT ureterolysis, partial cecectomy, appendectomy; [place] [facility] Surgeon: WConsuela Mimes MD  ? COLONOSCOPY    ? COLONOSCOPY WITH PROPOFOL N/A 12/11/2016  ? Procedure: COLONOSCOPY WITH PROPOFOL;  Surgeon: SLollie Sails MD;  [facility];  Service: Endoscopy;  Laterality: N/A;  ? ESOPHAGOGASTRODUODENOSCOPY    ? PORT-A-CATH REMOVAL N/A 07/12/2014  ? Procedure: REMOVAL PORT-A-CATH;  Surgeon: WMolly Maduro MD;  Location: [facility];  Service: General;  Laterality: N/A;  ? PORTACATH PLACEMENT Left 2015  ? ?Family History  ?Problem Relation Age of Onset  ? Cancer Mother   ? ?Social History  ? ?Socioeconomic History  ? Marital status: Married  ?  Spouse name: JRomie Minus ? Number of children: 4  ? Years of education: Not on file  ? Highest education level: Not on file  ?Occupational History  ? Not on file  ?Tobacco Use  ? Smoking status: Never  ? Smokeless tobacco: Never  ?Vaping Use  ? Vaping Use: Never used  ?Substance and Sexual Activity  ? Alcohol use: No  ? Drug use: No  ? Sexual activity: Yes  ?  Other Topics Concern  ? Not on file  ?Social History Narrative  ? Not on file  ? ?Social Determinants of Health  ? ?Financial Resource Strain: Not on file  ?Food Insecurity: Not on file  ?Transportation Needs: Not on file  ?Physical Activity: Not on file  ?Stress: Not on file  ?Social Connections: Not on file  ?Intimate Partner Violence: Not on file  ? ? ?Allergies:  ?Allergies  ?Allergen Reactions  ? Shellfish Allergy Other (See Comments)  ?  Pt. instructed by MD to avoid seafood  ? ? ?Current Medications: ?Current Outpatient Medications  ?Medication Sig Dispense Refill  ? amLODipine (NORVASC) 2.5 MG tablet Take by mouth.    ? aspirin EC 81 MG tablet Take 1 tablet (81 mg total) by mouth daily. 90 tablet 3  ? atorvastatin (LIPITOR) 40 MG tablet TAKE 1 TABLET BY MOUTH EVERY DAY 90 tablet 0  ?  carvedilol (COREG) 3.125 MG tablet TAKE 1 TABLET(3.125 MG) BY MOUTH TWICE DAILY 180 tablet 3  ? cholecalciferol (VITAMIN D3) 25 MCG (1000 UNIT) tablet Take 1,000 Units by mouth daily.    ? docusate sodium (COLACE) 1

## 2021-05-22 ENCOUNTER — Encounter: Payer: PPO | Admitting: Physician Assistant

## 2021-05-22 DIAGNOSIS — L97222 Non-pressure chronic ulcer of left calf with fat layer exposed: Secondary | ICD-10-CM | POA: Diagnosis not present

## 2021-05-22 DIAGNOSIS — E11622 Type 2 diabetes mellitus with other skin ulcer: Secondary | ICD-10-CM | POA: Diagnosis not present

## 2021-05-22 DIAGNOSIS — E11621 Type 2 diabetes mellitus with foot ulcer: Secondary | ICD-10-CM | POA: Diagnosis not present

## 2021-05-22 NOTE — Progress Notes (Addendum)
[name] ([id number]) ?Visit Report for 05/22/2021 ?Chief Complaint Document Details ?Patient Name: [name], [name]. ?Date of Service: 05/22/2021 11:15 AM ?Medical Record Number: [id number] ?Patient Account Number: [id number] ?Date of Birth/Sex: 1945-02-21 (76 y.o. M) ?Treating RN: Levora Dredge ?Primary Care Provider: Tommi Rumps Other Clinician: ?Referring Provider: Tommi Rumps ?Treating Provider/Extender: Jeri Cos ?Weeks in Treatment: 8 ?Information Obtained from: Patient ?Chief Complaint ?Left leg ulcer ?Electronic Signature(s) ?Signed: 05/22/2021 12:02:42 PM By: Worthy Keeler PA-C ?Entered By: Worthy Keeler on 05/22/2021 12:02:41 ?[name], [name] ([id number]) ?-------------------------------------------------------------------------------- ?HPI Details ?Patient Name: [name]. ?Date of Service: 05/22/2021 11:15 AM ?Medical Record Number: [id number] ?Patient Account Number: [id number] ?Date of Birth/Sex: 09-02-1945 (76 y.o. M) ?Treating RN: Levora Dredge ?Primary Care Provider: Tommi Rumps Other Clinician: ?Referring Provider: Tommi Rumps ?Treating Provider/Extender: Jeri Cos ?Weeks in Treatment: 8 ?History of Present Illness ?HPI Description: 03/27/2021 upon evaluation today patient appears to be doing well with regard to his wound. This was actually an issue that he ?has had since November 2022. He does have evidence of leg swelling and even early signs of lymphedema. Subsequently he tells me that his ?daughter has been putting some "green-cream" on this. I am not sure what that is but nonetheless he does seem to have a lot of healing ?compared to what he had previous. Fortunately there does not appear to be any signs of active infection locally nor systemically at this time which ?is great news. Honestly as dry as his leg is and even the wound bed I think that Xeroform gauze would probably be an awesome option for him to ?try to help with getting this area to heal  appropriately. ?Patient does have a history of diabetes mellitus type 2, chronic venous insufficiency, hypertension, chronic kidney disease stage III, congestive ?heart failure, and coronary artery disease. ?04/03/2021 upon evaluation today patient's legs actually appear to be doing great his left leg where the wounds are is actually measuring a bit ?smaller and the wounds themselves are definitely smaller. I am actually extremely pleased with where we stand today and I see no signs of active ?infection locally or systemically at this time which is great news. No fevers, chills, nausea, vomiting, or diarrhea. ?04/10/2021 upon evaluation today patient's wounds are actually showing signs of improvement both anterior and posterior. I think that he is making ?excellent progress and overall I see no need for any major changes here at this point. ?3/13; patient presents for follow-up. He has no issues or complaints today. He reports developing shingles to his chest with some open draining ?vesicles. He does have some scabbed over lesions. He saw his primary care provider for this issue. He was started on medication. His daughter ?has been doing the dressing changing to his left leg. He has no issues or complaints about the leg wound. ?04/24/2021 upon evaluation today patient appears to be doing well with regard to his leg ulcers. Both are showing signs of improvement with new ?epithelial growth which is great news. Overall very pleased in that regard. ?05/01/2021 upon evaluation today patient appears to be doing well with regard to his wound. He has been tolerating the dressing changes without ?complication. Fortunately I do not see any evidence of active infection locally nor systemically which is great news. No fevers, chills, nausea, ?vomiting, or diarrhea. ?05/08/2021 upon evaluation today patient's anterior leg actually appears to be doing quite well. I am very pleased in that regard. I do not see any ?signs of active  infection at this time which is great news. ?05-15-2021 upon evaluation today patient's wound on the anterior leg is still healed the posterior is looking better though not significantly smaller ?yet we just switch to collagen last week. Overall I think we definitely want to give this a little bit more time before making any major adjustments ?in my opinion. ?05-22-2021 upon evaluation today patient appears to be doing excellent in regard to the anterior leg this is still healed. In regard to the posterior ?leg is doing better it is very slow but nonetheless making progress little by little. Fortunately I do not see any evidence of active infection locally ?nor systemically at this time. ?Electronic Signature(s) ?Signed: 05/22/2021 1:02:42 PM By: Worthy Keeler PA-C ?Entered By: Worthy Keeler on 05/22/2021 13:02:42 ?[name], [name] ([id number]) ?-------------------------------------------------------------------------------- ?Physical Exam Details ?Patient Name: [name], [name]. ?Date of Service: 05/22/2021 11:15 AM ?Medical Record Number: [id number] ?Patient Account Number: [id number] ?Date of Birth/Sex: 1945-08-15 (76 y.o. M) ?Treating RN: Levora Dredge ?Primary Care Provider: Tommi Rumps Other Clinician: ?Referring Provider: Tommi Rumps ?Treating Provider/Extender: Jeri Cos ?Weeks in Treatment: 8 ?Constitutional ?Well-nourished and well-hydrated in no acute distress. ?Respiratory ?normal breathing without difficulty. ?Psychiatric ?this patient is able to make decisions and demonstrates good insight into disease process. Alert and Oriented x 3. pleasant and cooperative. ?Notes ?Upon inspection patient's wound bed showed evidence of good granulation and epithelization at this point. Fortunately I do not see any evidence ?of active infection which I think is good news and overall I feel like we are definitely on the right track here. ?Electronic Signature(s) ?Signed: 05/22/2021 1:03:03 PM By: Worthy Keeler PA-C ?Entered By: Worthy Keeler on 05/22/2021 13:03:03 ?[name], [name] ([id number]) ?-------------------------------------------------------------------------------- ?Physician Orders Details ?Patient Name: [name], [name]. ?Date of Service: 05/22/2021 11:15 AM ?Medical Record Number: [id number] ?Patient Account Number: [id number] ?Date of Birth/Sex: January 03, 1946 (76 y.o. M) ?Treating RN: Levora Dredge ?Primary Care Provider: Tommi Rumps Other Clinician: ?Referring Provider: Tommi Rumps ?Treating Provider/Extender: Jeri Cos ?Weeks in Treatment: 8 ?Verbal / Phone Orders: No ?Diagnosis Coding ?ICD-10 Coding ?Code Description ?E11.622 Type 2 diabetes mellitus with other skin ulcer ?I87.2 Venous insufficiency (chronic) (peripheral) ?Z12.458 Non-pressure chronic ulcer of other part of left lower leg with fat layer exposed ?I10 Essential (primary) hypertension ?N18.30 Chronic kidney disease, stage 3 unspecified ?I50.42 Chronic combined systolic (congestive) and diastolic (congestive) heart failure ?I25.10 Atherosclerotic heart disease of native coronary artery without angina pectoris ?Follow-up Appointments ?o Return Appointment in 1 week. ?Bathing/ Shower/ Hygiene ?o May shower; gently cleanse wound with antibacterial soap, rinse and pat dry prior to dressing wounds ?Edema Control - Lymphedema / Segmental Compressive Device / Other ?o Tubigrip single layer applied. - side D - left leg ?o Elevate, Exercise Daily and Avoid Standing for Long Periods of Time. ?o Elevate legs to the level of the heart and pump ankles as often as possible ?o Elevate leg(s) parallel to the floor when sitting. ?Wound Treatment ?Wound #2 - Lower Leg Wound Laterality: Left, Posterior ?Cleanser: Soap and Water 3 x Per Week/30 Days ?Discharge Instructions: Gently cleanse wound with antibacterial soap, rinse and pat dry prior to dressing wounds ?Primary Dressing: Prisma 4.34 (in) 3 x Per Week/30 Days ?Discharge  Instructions: Moisten w/normal saline or sterile water; Cover wound as directed. Do not remove from wound bed. ?Secondary Dressing: ABD Pad 5x9 (in/in) 3 x Per Week/30 Days ?Discharge Instructions: Cover with ABD pad ?Secured [employer]

## 2021-05-22 NOTE — Progress Notes (Signed)
[name] ([id number]) ?Visit Report for 05/22/2021 ?Arrival Information Details ?Patient Name: [name], [name]. ?Date of Service: 05/22/2021 11:15 AM ?Medical Record Number: [id number] ?Patient Account Number: [id number] ?Date of Birth/Sex: 01/14/1946 (76 y.o. M) ?Treating RN: Levora Dredge ?Primary Care Lorre Opdahl: Tommi Rumps Other Clinician: ?Referring Shanterica Biehler: Tommi Rumps ?Treating Keena Heesch/Extender: Jeri Cos ?Weeks in Treatment: 8 ?Visit Information History Since Last Visit ?Added or deleted any medications: No ?Patient Arrived: [name] ?Any new allergies or adverse reactions: No ?Arrival Time: 11:33 ?Had a fall or experienced change in No ?Accompanied By: self ?activities of daily living that may affect ?Transfer Assistance: None ?risk of falls: ?Patient Identification Verified: Yes ?Hospitalized since last visit: No ?Secondary Verification Process Completed: Yes ?Has Dressing in Place as Prescribed: Yes ?Patient Requires Transmission-Based Precautions: No ?Pain Present Now: No ?Patient Has Alerts: No ?Electronic Signature(s) ?Signed: 05/22/2021 4:49:52 PM By: Levora Dredge ?Entered By: Levora Dredge on 05/22/2021 11:33:23 ?BERGEN, MAGNER ([id number]) ?-------------------------------------------------------------------------------- ?Clinic Level of Care Assessment Details ?Patient Name: [name]. ?Date of Service: 05/22/2021 11:15 AM ?Medical Record Number: [id number] ?Patient Account Number: [id number] ?Date of Birth/Sex: Jun 13, 1945 (76 y.o. M) ?Treating RN: Levora Dredge ?Primary Care Brendaly Townsel: Tommi Rumps Other Clinician: ?Referring Sonakshi Rolland: Tommi Rumps ?Treating Pavneet Markwood/Extender: Jeri Cos ?Weeks in Treatment: 8 ?Clinic Level of Care Assessment Items ?TOOL 4 Quantity Score ?'[]'$  - Use when only an EandM is performed on FOLLOW-UP visit 0 ?ASSESSMENTS - Nursing Assessment / Reassessment ?X - Reassessment of Co-morbidities (includes updates in patient status) 1 10 ?X- 1  5 ?Reassessment of Adherence to Treatment Plan ?ASSESSMENTS - Wound and Skin Assessment / Reassessment ?X - Simple Wound Assessment / Reassessment - one wound 1 5 ?'[]'$  - 0 ?Complex Wound Assessment / Reassessment - multiple wounds ?'[]'$  - 0 ?Dermatologic / Skin Assessment (not related to wound area) ?ASSESSMENTS - Focused Assessment ?X - Circumferential Edema Measurements - multi extremities 1 5 ?'[]'$  - 0 ?Nutritional Assessment / Counseling / Intervention ?'[]'$  - 0 ?Lower Extremity Assessment (monofilament, tuning fork, pulses) ?'[]'$  - 0 ?Peripheral Arterial Disease Assessment (using hand held doppler) ?ASSESSMENTS - Ostomy and/or Continence Assessment and Care ?'[]'$  - Incontinence Assessment and Management 0 ?'[]'$  - 0 ?Ostomy Care Assessment and Management (repouching, etc.) ?PROCESS - Coordination of Care ?X - Simple Patient / Family Education for ongoing care 1 15 ?'[]'$  - 0 ?Complex (extensive) Patient / Family Education for ongoing care ?'[]'$  - 0 ?Staff obtains Consents, Records, Test Results / Process Orders ?'[]'$  - 0 ?Staff telephones HHA, Nursing Homes / Clarify orders / etc ?'[]'$  - 0 ?Routine Transfer to another Facility (non-emergent condition) ?'[]'$  - 0 ?Routine Hospital Admission (non-emergent condition) ?'[]'$  - 0 ?New Admissions / Biomedical engineer / Ordering NPWT, Apligraf, etc. ?'[]'$  - 0 ?Emergency Hospital Admission (emergent condition) ?X- 1 10 ?Simple Discharge Coordination ?'[]'$  - 0 ?Complex (extensive) Discharge Coordination ?PROCESS - Special Needs ?'[]'$  - Pediatric / Minor Patient Management 0 ?'[]'$  - 0 ?Isolation Patient Management ?'[]'$  - 0 ?Hearing / Language / Visual special needs ?'[]'$  - 0 ?Assessment of Community assistance (transportation, D/C planning, etc.) ?'[]'$  - 0 ?Additional assistance / Altered mentation ?'[]'$  - 0 ?Support Surface(s) Assessment (bed, cushion, seat, etc.) ?INTERVENTIONS - Wound Cleansing / Measurement ?ZEYAD, DELAGUILA ([id number]) ?X- 1 5 ?Simple Wound Cleansing - one wound ?'[]'$  - 0 ?Complex Wound  Cleansing - multiple wounds ?X- 1 5 ?Wound Imaging (photographs - any number of wounds) ?'[]'$  - 0 ?Wound Tracing (instead of photographs) ?X- 1 5 ?Simple Wound Measurement -  one wound ?'[]'$  - 0 ?Complex Wound Measurement - multiple wounds ?INTERVENTIONS - Wound Dressings ?X - Small Wound Dressing one or multiple wounds 1 10 ?'[]'$  - 0 ?Medium Wound Dressing one or multiple wounds ?'[]'$  - 0 ?Large Wound Dressing one or multiple wounds ?'[]'$  - 0 ?Application of Medications - topical ?'[]'$  - 0 ?Application of Medications - injection ?INTERVENTIONS - Miscellaneous ?'[]'$  - External ear exam 0 ?'[]'$  - 0 ?Specimen Collection (cultures, biopsies, blood, body fluids, etc.) ?'[]'$  - 0 ?Specimen(s) / Culture(s) sent or taken to Lab for analysis ?'[]'$  - 0 ?Patient Transfer (multiple staff / Civil Service fast streamer / Similar devices) ?'[]'$  - 0 ?Simple Staple / Suture removal (25 or less) ?'[]'$  - 0 ?Complex Staple / Suture removal (26 or more) ?'[]'$  - 0 ?Hypo / Hyperglycemic Management (close monitor of Blood Glucose) ?'[]'$  - 0 ?Ankle / Brachial Index (ABI) - do not check if billed separately ?X- 1 5 ?Vital Signs ?Has the patient been seen at the hospital within the last three years: Yes ?Total Score: 80 ?Level Of Care: New/Established - Level ?3 ?Electronic Signature(s) ?Signed: 05/22/2021 4:49:52 PM By: Levora Dredge ?Entered By: Levora Dredge on 05/22/2021 12:43:54 ?DAIRON, PROCTER ([id number]) ?-------------------------------------------------------------------------------- ?Encounter Discharge Information Details ?Patient Name: [name], [name]. ?Date of Service: 05/22/2021 11:15 AM ?Medical Record Number: [id number] ?Patient Account Number: [id number] ?Date of Birth/Sex: 01-30-1946 (76 y.o. M) ?Treating RN: Levora Dredge ?Primary Care Arron Tetrault: Tommi Rumps Other Clinician: ?Referring Barbette Mcglaun: Tommi Rumps ?Treating Detron Carras/Extender: Jeri Cos ?Weeks in Treatment: 8 ?Encounter Discharge Information Items ?Discharge Condition: Stable ?Ambulatory Status:  [name] ?Discharge Destination: Home ?Transportation: Private Auto ?Accompanied By: self ?Schedule Follow-up Appointment: Yes ?Clinical Summary of Care: Patient Declined ?Electronic Signature(s) ?Signed: 05/22/2021 12:45:19 PM By: Levora Dredge ?Entered By: Levora Dredge on 05/22/2021 12:45:19 ?CORDARRELL, SANE ([id number]) ?-------------------------------------------------------------------------------- ?Lower Extremity Assessment Details ?Patient Name: TAURUS, ALAMO. ?Date of Service: 05/22/2021 11:15 AM ?Medical Record Number: [id number] ?Patient Account Number: [id number] ?Date of Birth/Sex: 1945-10-21 (76 y.o. M) ?Treating RN: Levora Dredge ?Primary Care Lior Cartelli: Tommi Rumps Other Clinician: ?Referring Vinie Charity: Tommi Rumps ?Treating Gisell Buehrle/Extender: Jeri Cos ?Weeks in Treatment: 8 ?Edema Assessment ?Assessed: [Left: No] [Right: No] ?Edema: [Left: Ye] [Right: s] ?Calf ?Left: Right: ?Point of Measurement: 35 cm From Medial Instep 30 cm ?Ankle ?Left: Right: ?Point of Measurement: 10 cm From Medial Instep 21.5 cm ?Electronic Signature(s) ?Signed: 05/22/2021 4:49:52 PM By: Levora Dredge ?Entered By: Levora Dredge on 05/22/2021 11:45:00 ?JAKIN, PAVAO ([id number]) ?-------------------------------------------------------------------------------- ?Multi Wound Chart Details ?Patient Name: RAHM, MINIX. ?Date of Service: 05/22/2021 11:15 AM ?Medical Record Number: [id number] ?Patient Account Number: [id number] ?Date of Birth/Sex: May 02, 1945 (76 y.o. M) ?Treating RN: Levora Dredge ?Primary Care Roselinda Bahena: Tommi Rumps Other Clinician: ?Referring Richele Strand: Tommi Rumps ?Treating Yosiah Jasmin/Extender: Jeri Cos ?Weeks in Treatment: 8 ?Vital Signs ?Height(in): ?Pulse(bpm): 85 ?Weight(lbs): 183 ?Blood Pressure(mmHg): 139/96 ?Body Mass Index(BMI): ?Temperature(??F): 98.1 ?Respiratory Rate(breaths/min): 18 ?Photos: [N/A:N/A] ?Wound Location: Left, Posterior Lower Leg N/A N/A ?Wounding Event: Gradually  Appeared N/A N/A ?Primary Etiology: Diabetic Wound/Ulcer of the Lower N/A N/A ?Extremity ?Comorbid History: Coronary Artery Disease, N/A N/A ?Hypertension, Type II Diabetes ?Date Acquired: 12/28/2020 N/A N/A ?We

## 2021-05-23 ENCOUNTER — Telehealth: Payer: Self-pay

## 2021-05-23 NOTE — Telephone Encounter (Signed)
Patient informed. 

## 2021-05-23 NOTE — Telephone Encounter (Signed)
-----   Message from Earlie Server, MD sent at 05/18/2021  6:36 PM EDT ----- ?Sorry that I forget to mention that he needs monthly vitamin B12 x11 ?Add vitamin B12 injection to his next visit with me in a year.  Thank you ? ?

## 2021-05-23 NOTE — Telephone Encounter (Signed)
Please schedule patient for monthly B12 injection x 11. Also, please add B12 injection to MD visit next year. I will notify patient of appts. Thanks.  ?

## 2021-05-25 ENCOUNTER — Other Ambulatory Visit
Admission: RE | Admit: 2021-05-25 | Discharge: 2021-05-25 | Disposition: A | Discharge status: Home / Self Care | Payer: PPO | Source: Ambulatory Visit | Attending: Nurse Practitioner | Admitting: Nurse Practitioner

## 2021-05-25 ENCOUNTER — Ambulatory Visit (INDEPENDENT_AMBULATORY_CARE_PROVIDER_SITE_OTHER): Payer: PPO | Admitting: Nurse Practitioner

## 2021-05-25 ENCOUNTER — Encounter: Payer: Self-pay | Admitting: Nurse Practitioner

## 2021-05-25 VITALS — BP 100/60 | HR 84 | Ht 69.0 in | Wt 184.0 lb

## 2021-05-25 DIAGNOSIS — I10 Essential (primary) hypertension: Secondary | ICD-10-CM | POA: Diagnosis not present

## 2021-05-25 DIAGNOSIS — N1832 Chronic kidney disease, stage 3b: Secondary | ICD-10-CM | POA: Diagnosis not present

## 2021-05-25 DIAGNOSIS — Z79899 Other long term (current) drug therapy: Secondary | ICD-10-CM | POA: Diagnosis not present

## 2021-05-25 DIAGNOSIS — I2510 Atherosclerotic heart disease of native coronary artery without angina pectoris: Secondary | ICD-10-CM

## 2021-05-25 DIAGNOSIS — I4819 Other persistent atrial fibrillation: Secondary | ICD-10-CM | POA: Diagnosis not present

## 2021-05-25 DIAGNOSIS — I4891 Unspecified atrial fibrillation: Secondary | ICD-10-CM | POA: Diagnosis not present

## 2021-05-25 DIAGNOSIS — I5022 Chronic systolic (congestive) heart failure: Secondary | ICD-10-CM

## 2021-05-25 DIAGNOSIS — I5023 Acute on chronic systolic (congestive) heart failure: Secondary | ICD-10-CM

## 2021-05-25 DIAGNOSIS — I255 Ischemic cardiomyopathy: Secondary | ICD-10-CM

## 2021-05-25 LAB — COMPREHENSIVE METABOLIC PANEL
ALT: 26 U/L (ref 0–44)
AST: 30 U/L (ref 15–41)
Albumin: 3.4 g/dL — ABNORMAL LOW (ref 3.5–5.0)
Alkaline Phosphatase: 113 U/L (ref 38–126)
Anion gap: 10 (ref 5–15)
BUN: 37 mg/dL — ABNORMAL HIGH (ref 8–23)
CO2: 22 mmol/L (ref 22–32)
Calcium: 8.8 mg/dL — ABNORMAL LOW (ref 8.9–10.3)
Chloride: 113 mmol/L — ABNORMAL HIGH (ref 98–111)
Creatinine, Ser: 1.9 mg/dL — ABNORMAL HIGH (ref 0.61–1.24)
GFR, Estimated: 37 mL/min — ABNORMAL LOW (ref >60)
Glucose, Bld: 183 mg/dL — ABNORMAL HIGH (ref 70–99)
Potassium: 3.6 mmol/L (ref 3.5–5.1)
Sodium: 145 mmol/L (ref 135–145)
Total Bilirubin: 2.3 mg/dL — ABNORMAL HIGH (ref 0.3–1.2)
Total Protein: 7 g/dL (ref 6.5–8.1)

## 2021-05-25 LAB — CBC
HCT: 40 % (ref 39.0–52.0)
Hemoglobin: 12.6 g/dL — ABNORMAL LOW (ref 13.0–17.0)
MCH: 33.3 pg (ref 26.0–34.0)
MCHC: 31.5 g/dL (ref 30.0–36.0)
MCV: 105.8 fL — ABNORMAL HIGH (ref 80.0–100.0)
Platelets: 114 10*3/uL — ABNORMAL LOW (ref 150–400)
RBC: 3.78 MIL/uL — ABNORMAL LOW (ref 4.22–5.81)
RDW: 18.7 % — ABNORMAL HIGH (ref 11.5–15.5)
WBC: 3.6 10*3/uL — ABNORMAL LOW (ref 4.0–10.5)
nRBC: 0 % (ref 0.0–0.2)

## 2021-05-25 LAB — MAGNESIUM: Magnesium: 1.8 mg/dL (ref 1.7–2.4)

## 2021-05-25 LAB — TSH: TSH: 3.523 u[IU]/mL (ref 0.350–4.500)

## 2021-05-25 MED ORDER — APIXABAN 5 MG PO TABS: 5.0000 mg | ORAL_TABLET | Freq: Two times a day (BID) | ORAL | 0 refills | Status: DC

## 2021-05-25 MED ORDER — TORSEMIDE 40 MG PO TABS: 40.0000 mg | ORAL_TABLET | Freq: Two times a day (BID) | ORAL | 0 refills | Status: DC

## 2021-05-25 NOTE — Progress Notes (Signed)
? ? ?Office Visit  ?  ?Patient Name: [name]. ?Date of Encounter: 05/25/2021 ? ?Primary Care Provider:  Leone Haven, MD ?Primary Cardiologist:  Kathlyn Sacramento, MD ? ?Chief Complaint  ?  ?76 year old male with a history of CAD status post circumflex stenting, chronic HFrEF, mixed ischemic and nonischemic cardiomyopathy, hypertension, hyperlipidemia, stage III chronic kidney disease, and colon cancer, who presents for follow-up secondary to heart failure. ? ?Past Medical History  ?  ?Past Medical History:  ?Diagnosis Date  ? Adenocarcinoma of colon ([place]) 06/19/2013  ? a.) moderately differentiated stage IIIc (T4bN2a M0) adenocarcinoma of colon  s/p colectomy followed by chemoRx with oxaliplatin & fluorouacil.  ? Anemia   ? CAD (coronary artery disease)   ? a. 02/2015 Cath: LM nl, LAD 50/40 mid/distal, LCX 80p, RCA 40p, 30d, RPL1 40, CO 3.27, CI 1.65; b. 06/09/15 PCI: LM minor irregs, m-dLAD 50%, dLAD 40%, pLCx 80% (s/p PCI/DES 0%), pRCA 40%, dRCA 30%, 1st RPLB 40%  ? Chronic kidney disease, stage 3a ([place])   ? Chronic systolic CHF (congestive heart failure) ([place])   ? a. 02/2015 Echo: EF 20-25%, diff HK, mod MR, mildy dil LA, mildly dil RV w/ mod RV syst dysfxn, mildly dil RA, mod TR, PASP 61mHg; b. 09/2017 Echo: EF 40-45%, diff HK. Gr1 DD. Mild to mod MR. Mildly to mod dil LA; 09/2019 Echo: EF 25-30%, glob HK, mod reduced RV fxn, sev elev PASP. Mod dil LA. Mild-mod MR.  ? Dia hypertension 07/20/2014  ? Diabetes mellitus without complication ([facility]   ? Hypertensive heart disease   ? Left inguinal hernia 05/2020  ? Mixed Ischemic and Non-ischemic Cardiomyopathy   ? a.) 02/2015 EF 20-25%, diff HK; b.) 09/2016 EF 40-45%; c.) 09/2019 EF 25-30%  ? Moderate mitral regurgitation   ? a. 09/2017 Echo: Mild to mod MR; a. 09/2019 Echo: mild-mod MR.  ? Pulmonary hypertension ([facility]   ? Tubular adenoma of colon   ? ?Past Surgical History:  ?Procedure Laterality Date  ? APPENDECTOMY N/A 06/19/2017  ? [facility] Surgeon:  WConsuela Mimes MD  ? CARDIAC CATHETERIZATION Bilateral 02/24/2015  ? Procedure: Right/Left Heart Cath and Coronary Angiography;  Surgeon: MWellington Hampshire MD;  [facility];  Service: Cardiovascular;  Laterality: Bilateral;  ? CARDIAC CATHETERIZATION N/A 06/09/2015  ? Procedure: Coronary Stent Intervention (3.0 x 12 mm Xience [place] DES to LCx);  Surgeon: MWellington Hampshire MD;  [facility];  Service: Cardiovascular;  Laterality: N/A  ? COLECTOMY  06/19/2017  ? Descending and proximal sigmoid colectomy, LEFT ureterolysis, partial cecectomy, appendectomy; [facility] Surgeon: WConsuela Mimes MD  ? COLONOSCOPY    ? COLONOSCOPY WITH PROPOFOL N/A 12/11/2016  ? Procedure: COLONOSCOPY WITH PROPOFOL;  Surgeon: SLollie Sails MD;  [facility];  Service: Endoscopy;  Laterality: N/A;  ? ESOPHAGOGASTRODUODENOSCOPY    ? PORT-A-CATH REMOVAL N/A 07/12/2014  ? Procedure: REMOVAL PORT-A-CATH;  Surgeon: WMolly Maduro MD;  Location: [facility];  Service: General;  Laterality: N/A;  ? PORTACATH PLACEMENT Left 2015  ? ? ?Allergies ? ?Allergies  ?Allergen Reactions  ? Shellfish Allergy Other (See Comments) and Hives  ?  Pt. instructed by MD to avoid seafood  ? ? ?History of Present Illness  ?  ?76year old male with above complex past medical history including CAD status post circumflex stenting, chronic HFrEF, mixed ischemic and nonischemic cardiomyopathy, hypertension, hyperlipidemia, stage III chronic kidney disease, and colon cancer.  In the setting  of colon cancer, he underwent colectomy in 2015 followed by chemotherapy with oxaliplatin and fluorouracil.  He subsequently developed neuropathy.  In January 2017, he was diagnosed with systolic heart failure when echocardiogram showed an EF of 20 to 25% with moderate mitral regurgitation/tricuspid regurgitation, and moderate to severe pulmonary hypertension.  Right and left heart catheterization showed moderate to  severe pulmonary hypertension with severely elevated filling pressures and severely reduced cardiac output.  Coronary angiography showed severe one-vessel coronary artery disease with an 80 to 90% stenosis in the proximal left circumflex and mild diffuse disease affecting the LAD and RCA.  Following initiation of heart failure medications and adequate diuresis, he subsequently underwent staged PCI of the left circumflex in May 2017.  Follow-up echo November 2020 continued to show depressed EF at 30 to 35%.  In the setting of PVCs, he underwent outpatient monitoring at that time which showed a 6% PVC burden, which improved with resumption of carvedilol.  Echocardiogram in August 2021 showed an EF of 25 to 30% with moderately reduced RV systolic function, severe pulmonary hypertension, and mild to moderate mitral regurgitation.  Patient has been offered referral to electrophysiology for consideration of ICD therapy however, he has declined. ? ?Mr. Sherk was last seen in cardiology clinic in July 2022, at which time he was doing well and his weight was stable at 158 pounds.  Medical therapy was continued.  Unfortunately, since his last visit, Mr. Fayson has had steady weight gain with increasing lower extremity edema, abdominal girth, and also development of scrotal edema.  Over the past few months, he has been noticing more dyspnea on exertion but denies chest pain, palpitations, PND, or orthopnea.  He was seen in the emergency department in early March secondary to shingles, and was noted to be volume overloaded at that time (BNP greater than 4500) and treated with intravenous Lasix.  Admission was offered however he refused.  He was subsequently seen in heart failure clinic visit on March 14, his weight was up to 184 pounds.  Notes indicate that patient was felt to be euvolemic.  Primary care follow-up March 28, he complained of cough and noted to be volume overloaded.  Lasix was increased to 20 mg twice daily for  3 days.  Follow-up labs showed stable creatinine at 1.52 in March 29.   On April 6, he underwent surveillance CT of his abdomen and pelvis which showed new anasarca without evidence of recurrent metastatic carcinoma.  He was seen again in heart failure clinic on April 10, and notes again indicate that he was euvolemic at a weight of 185 pounds.  Patient is markedly volume overloaded on examination today and prefers to avoid admission.  Also notable, he is in atrial fibrillation at a rate of 98 bpm. ? ?Home Medications  ?  ?Current Outpatient Medications  ?Medication Sig Dispense Refill  ? amLODipine (NORVASC) 2.5 MG tablet Take by mouth.    ? apixaban (ELIQUIS) 5 MG TABS tablet Take 1 tablet (5 mg total) by mouth 2 (two) times daily. 60 tablet 0  ? atorvastatin (LIPITOR) 40 MG tablet TAKE 1 TABLET BY MOUTH EVERY DAY 90 tablet 0  ? carvedilol (COREG) 3.125 MG tablet TAKE 1 TABLET(3.125 MG) BY MOUTH TWICE DAILY 180 tablet 3  ? cholecalciferol (VITAMIN D3) 25 MCG (1000 UNIT) tablet Take 1,000 Units by mouth daily.    ? docusate sodium (COLACE) 100 MG capsule Take 1 capsule (100 mg total) by mouth daily as needed for mild constipation.  30 capsule 5  ? ENTRESTO 24-26 MG TAKE 1 TABLET BY MOUTH TWICE DAILY 60 tablet 5  ? ferrous sulfate 325 (65 FE) MG EC tablet Take 1 tablet (325 mg total) by mouth 2 (two) times daily with a meal. 60 tablet 4  ? gabapentin (NEURONTIN) 100 MG capsule Take by mouth.    ? HYDROcodone-acetaminophen (NORCO/VICODIN) 5-325 MG tablet Take 1 tablet by mouth every 6 (six) hours as needed for moderate pain. 15 tablet 0  ? ibuprofen (ADVIL) 800 MG tablet Take 1 tablet (800 mg total) by mouth every 8 (eight) hours as needed. 30 tablet 0  ? Lidocaine 4 % GEL Apply 1 application. topically 4 (four) times daily as needed. 113 g 0  ? loratadine (CLARITIN) 10 MG tablet Take 1 tablet (10 mg total) by mouth daily. 30 tablet 11  ? metFORMIN (GLUCOPHAGE) 500 MG tablet TAKE 1 TABLET(500 MG) BY MOUTH TWICE DAILY  WITH A MEAL 180 tablet 3  ? spironolactone (ALDACTONE) 25 MG tablet TAKE 1 TABLET(25 MG) BY MOUTH DAILY 30 tablet 0  ? tamsulosin (FLOMAX) 0.4 MG CAPS capsule TAKE 1 CAPSULE(0.4 MG) BY MOUTH DAILY AFTE

## 2021-05-25 NOTE — Patient Instructions (Signed)
Medication Instructions:  ? ?Your physician has recommended you make the following change in your medication:  ? ?STOP Aspirin ? ?STOP Furosemide (Lasix) ? ?START Eliquis (Apixaban) 5 mg TWICE daily (Samples given today and a new Rx has been sent to your pharmacy) ? ?START Torsemide 40 mg TWICE daily  ? ?*If you need a refill on your cardiac medications before your next appointment, please call your pharmacy* ? ? ?Lab Work: ? ?Today at the [facility]: CBC, CMET, Magnesium, TSH ? ?-  Please go to the [facility].  ?-  You will check in at the front desk to the right as you walk into the atrium.  ? ? ?If you have labs (blood work) drawn today and your tests are completely normal, you will receive your results only by: ?MyChart Message (if you have MyChart) OR ?A paper copy in the mail ?If you have any lab test that is abnormal or we need to change your treatment, we will call you to review the results. ? ? ?Testing/Procedures: ? ?None ordered ? ? ?Follow-Up: ?At [facility], you and your health needs are our priority.  As part of our continuing mission to provide you with exceptional heart care, we have created designated Provider Care Teams.  These Care Teams include your primary Cardiologist (physician) and Advanced Practice Providers (APPs -  Physician Assistants and Nurse Practitioners) who all work together to provide you with the care you need, when you need it. ? ?We recommend signing up for the patient portal called "MyChart".  Sign up information is provided on this After Visit Summary.  MyChart is used to connect with patients for Virtual Visits (Telemedicine).  Patients are able to view lab/test results, encounter notes, upcoming appointments, etc.  Non-urgent messages can be sent to your provider as well.   ?To learn more about what you can do with MyChart, go to [email or web address].   ? ?Your next appointment:   ? ?1 week(s)  (06/01/21 at 1:00 PM with Murray Hodgkins, NP) ? ?The  format for your next appointment:   ?In Person ? ?Provider:   ?You may see Kathlyn Sacramento, MD or one of the following Advanced Practice Providers on your designated Care Team:   ?Murray Hodgkins, NP ?Christell Faith, PA-C ?Cadence Kathlen Mody, PA-C ? ?Important Information About Sugar ? ? ? ? ? ? ?

## 2021-05-26 ENCOUNTER — Telehealth: Payer: Self-pay | Admitting: Nurse Practitioner

## 2021-05-26 NOTE — Telephone Encounter (Signed)
Pt c/o medication issue: ? ?1. Name of Medication:  ? torsemide 40 MG TABS  ? ? ?2. How are you currently taking this medication (dosage and times per day)? Take 40 mg by mouth 2 (two) times daily ? ?3. Are you having a reaction (difficulty breathing--STAT)? No ? ?4. What is your medication issue?  Pharmacist states that medication only comes in the brand Soaanz, which insurance will not cover due to it being too expensive. Pharmacist would like to know if it can be changed to '20mg'$  tablet 2 tablets twice daily. Please advise ? ? ?

## 2021-05-26 NOTE — Telephone Encounter (Signed)
Spoke with pharmacist, advised that it was ok for patient to have 2 '20mg'$  tablets twice daily.  ?

## 2021-05-29 ENCOUNTER — Encounter: Payer: Self-pay | Admitting: Internal Medicine

## 2021-05-29 ENCOUNTER — Other Ambulatory Visit: Payer: Self-pay

## 2021-05-29 ENCOUNTER — Emergency Department: Payer: PPO

## 2021-05-29 ENCOUNTER — Inpatient Hospital Stay
Admission: EM | Admit: 2021-05-29 | Discharge: 2021-06-03 | DRG: 291 | Disposition: A | Discharge status: Home Health | Payer: PPO | Attending: Obstetrics and Gynecology | Admitting: Obstetrics and Gynecology

## 2021-05-29 ENCOUNTER — Encounter: Payer: PPO | Admitting: Physician Assistant

## 2021-05-29 DIAGNOSIS — I272 Pulmonary hypertension, unspecified: Secondary | ICD-10-CM | POA: Diagnosis present

## 2021-05-29 DIAGNOSIS — D696 Thrombocytopenia, unspecified: Secondary | ICD-10-CM | POA: Diagnosis present

## 2021-05-29 DIAGNOSIS — E1140 Type 2 diabetes mellitus with diabetic neuropathy, unspecified: Secondary | ICD-10-CM | POA: Diagnosis present

## 2021-05-29 DIAGNOSIS — J9601 Acute respiratory failure with hypoxia: Secondary | ICD-10-CM | POA: Diagnosis not present

## 2021-05-29 DIAGNOSIS — Y92239 Unspecified place in hospital as the place of occurrence of the external cause: Secondary | ICD-10-CM | POA: Diagnosis not present

## 2021-05-29 DIAGNOSIS — I083 Combined rheumatic disorders of mitral, aortic and tricuspid valves: Secondary | ICD-10-CM | POA: Diagnosis not present

## 2021-05-29 DIAGNOSIS — L97822 Non-pressure chronic ulcer of other part of left lower leg with fat layer exposed: Secondary | ICD-10-CM | POA: Diagnosis not present

## 2021-05-29 DIAGNOSIS — R069 Unspecified abnormalities of breathing: Secondary | ICD-10-CM | POA: Diagnosis not present

## 2021-05-29 DIAGNOSIS — I4819 Other persistent atrial fibrillation: Secondary | ICD-10-CM | POA: Diagnosis not present

## 2021-05-29 DIAGNOSIS — N1831 Chronic kidney disease, stage 3a: Secondary | ICD-10-CM | POA: Diagnosis not present

## 2021-05-29 DIAGNOSIS — E1159 Type 2 diabetes mellitus with other circulatory complications: Secondary | ICD-10-CM | POA: Diagnosis not present

## 2021-05-29 DIAGNOSIS — I5023 Acute on chronic systolic (congestive) heart failure: Secondary | ICD-10-CM | POA: Diagnosis not present

## 2021-05-29 DIAGNOSIS — Z85038 Personal history of other malignant neoplasm of large intestine: Secondary | ICD-10-CM | POA: Diagnosis not present

## 2021-05-29 DIAGNOSIS — I420 Dilated cardiomyopathy: Secondary | ICD-10-CM | POA: Diagnosis not present

## 2021-05-29 DIAGNOSIS — Z86010 Personal history of colonic polyps: Secondary | ICD-10-CM | POA: Diagnosis not present

## 2021-05-29 DIAGNOSIS — I248 Other forms of acute ischemic heart disease: Secondary | ICD-10-CM | POA: Diagnosis not present

## 2021-05-29 DIAGNOSIS — D7589 Other specified diseases of blood and blood-forming organs: Secondary | ICD-10-CM | POA: Diagnosis present

## 2021-05-29 DIAGNOSIS — E785 Hyperlipidemia, unspecified: Secondary | ICD-10-CM | POA: Diagnosis present

## 2021-05-29 DIAGNOSIS — N179 Acute kidney failure, unspecified: Secondary | ICD-10-CM | POA: Diagnosis not present

## 2021-05-29 DIAGNOSIS — Z7984 Long term (current) use of oral hypoglycemic drugs: Secondary | ICD-10-CM

## 2021-05-29 DIAGNOSIS — I4891 Unspecified atrial fibrillation: Secondary | ICD-10-CM | POA: Diagnosis present

## 2021-05-29 DIAGNOSIS — I5082 Biventricular heart failure: Secondary | ICD-10-CM | POA: Diagnosis not present

## 2021-05-29 DIAGNOSIS — R0689 Other abnormalities of breathing: Secondary | ICD-10-CM | POA: Diagnosis not present

## 2021-05-29 DIAGNOSIS — I959 Hypotension, unspecified: Secondary | ICD-10-CM | POA: Diagnosis present

## 2021-05-29 DIAGNOSIS — I2510 Atherosclerotic heart disease of native coronary artery without angina pectoris: Secondary | ICD-10-CM | POA: Diagnosis not present

## 2021-05-29 DIAGNOSIS — D61818 Other pancytopenia: Secondary | ICD-10-CM | POA: Diagnosis present

## 2021-05-29 DIAGNOSIS — I10 Essential (primary) hypertension: Secondary | ICD-10-CM | POA: Diagnosis present

## 2021-05-29 DIAGNOSIS — I878 Other specified disorders of veins: Secondary | ICD-10-CM | POA: Diagnosis present

## 2021-05-29 DIAGNOSIS — Z7901 Long term (current) use of anticoagulants: Secondary | ICD-10-CM

## 2021-05-29 DIAGNOSIS — I255 Ischemic cardiomyopathy: Secondary | ICD-10-CM | POA: Diagnosis present

## 2021-05-29 DIAGNOSIS — E1122 Type 2 diabetes mellitus with diabetic chronic kidney disease: Secondary | ICD-10-CM | POA: Diagnosis not present

## 2021-05-29 DIAGNOSIS — I482 Chronic atrial fibrillation, unspecified: Secondary | ICD-10-CM | POA: Diagnosis present

## 2021-05-29 DIAGNOSIS — Z9049 Acquired absence of other specified parts of digestive tract: Secondary | ICD-10-CM

## 2021-05-29 DIAGNOSIS — I130 Hypertensive heart and chronic kidney disease with heart failure and stage 1 through stage 4 chronic kidney disease, or unspecified chronic kidney disease: Principal | ICD-10-CM | POA: Diagnosis present

## 2021-05-29 DIAGNOSIS — R0602 Shortness of breath: Secondary | ICD-10-CM | POA: Diagnosis not present

## 2021-05-29 DIAGNOSIS — Z79899 Other long term (current) drug therapy: Secondary | ICD-10-CM

## 2021-05-29 DIAGNOSIS — E119 Type 2 diabetes mellitus without complications: Secondary | ICD-10-CM

## 2021-05-29 DIAGNOSIS — R7989 Other specified abnormal findings of blood chemistry: Secondary | ICD-10-CM | POA: Diagnosis present

## 2021-05-29 DIAGNOSIS — Z20822 Contact with and (suspected) exposure to covid-19: Secondary | ICD-10-CM | POA: Diagnosis not present

## 2021-05-29 DIAGNOSIS — I493 Ventricular premature depolarization: Secondary | ICD-10-CM | POA: Diagnosis present

## 2021-05-29 DIAGNOSIS — Z91013 Allergy to seafood: Secondary | ICD-10-CM | POA: Diagnosis not present

## 2021-05-29 DIAGNOSIS — I5021 Acute systolic (congestive) heart failure: Secondary | ICD-10-CM | POA: Diagnosis not present

## 2021-05-29 DIAGNOSIS — I872 Venous insufficiency (chronic) (peripheral): Secondary | ICD-10-CM | POA: Diagnosis not present

## 2021-05-29 DIAGNOSIS — W19XXXA Unspecified fall, initial encounter: Secondary | ICD-10-CM | POA: Diagnosis not present

## 2021-05-29 DIAGNOSIS — E11622 Type 2 diabetes mellitus with other skin ulcer: Secondary | ICD-10-CM | POA: Diagnosis not present

## 2021-05-29 DIAGNOSIS — R0902 Hypoxemia: Secondary | ICD-10-CM | POA: Diagnosis not present

## 2021-05-29 DIAGNOSIS — J90 Pleural effusion, not elsewhere classified: Secondary | ICD-10-CM | POA: Diagnosis not present

## 2021-05-29 DIAGNOSIS — Z955 Presence of coronary angioplasty implant and graft: Secondary | ICD-10-CM

## 2021-05-29 DIAGNOSIS — L97222 Non-pressure chronic ulcer of left calf with fat layer exposed: Secondary | ICD-10-CM | POA: Diagnosis not present

## 2021-05-29 DIAGNOSIS — I110 Hypertensive heart disease with heart failure: Secondary | ICD-10-CM | POA: Diagnosis not present

## 2021-05-29 DIAGNOSIS — I361 Nonrheumatic tricuspid (valve) insufficiency: Secondary | ICD-10-CM | POA: Diagnosis not present

## 2021-05-29 HISTORY — DX: Other persistent atrial fibrillation: I48.19

## 2021-05-29 HISTORY — DX: Essential (primary) hypertension: I10

## 2021-05-29 LAB — CBC
HCT: 42.3 % (ref 39.0–52.0)
Hemoglobin: 13.2 g/dL (ref 13.0–17.0)
MCH: 33.3 pg (ref 26.0–34.0)
MCHC: 31.2 g/dL (ref 30.0–36.0)
MCV: 106.8 fL — ABNORMAL HIGH (ref 80.0–100.0)
Platelets: 105 10*3/uL — ABNORMAL LOW (ref 150–400)
RBC: 3.96 MIL/uL — ABNORMAL LOW (ref 4.22–5.81)
RDW: 19 % — ABNORMAL HIGH (ref 11.5–15.5)
WBC: 7.4 10*3/uL (ref 4.0–10.5)
nRBC: 0 % (ref 0.0–0.2)

## 2021-05-29 LAB — TROPONIN I (HIGH SENSITIVITY)
Troponin I (High Sensitivity): 140 ng/L (ref <18)
Troponin I (High Sensitivity): 144 ng/L (ref <18)

## 2021-05-29 LAB — BASIC METABOLIC PANEL
Anion gap: 14 (ref 5–15)
BUN: 45 mg/dL — ABNORMAL HIGH (ref 8–23)
CO2: 20 mmol/L — ABNORMAL LOW (ref 22–32)
Calcium: 9 mg/dL (ref 8.9–10.3)
Chloride: 113 mmol/L — ABNORMAL HIGH (ref 98–111)
Creatinine, Ser: 2.24 mg/dL — ABNORMAL HIGH (ref 0.61–1.24)
GFR, Estimated: 30 mL/min — ABNORMAL LOW (ref >60)
Glucose, Bld: 214 mg/dL — ABNORMAL HIGH (ref 70–99)
Potassium: 3.7 mmol/L (ref 3.5–5.1)
Sodium: 147 mmol/L — ABNORMAL HIGH (ref 135–145)

## 2021-05-29 LAB — RESP PANEL BY RT-PCR (FLU A&B, COVID) ARPGX2
Influenza A by PCR: NEGATIVE
Influenza B by PCR: NEGATIVE
SARS Coronavirus 2 by RT PCR: NEGATIVE

## 2021-05-29 LAB — HEPATIC FUNCTION PANEL
ALT: 53 U/L — ABNORMAL HIGH (ref 0–44)
AST: 55 U/L — ABNORMAL HIGH (ref 15–41)
Albumin: 3.1 g/dL — ABNORMAL LOW (ref 3.5–5.0)
Alkaline Phosphatase: 102 U/L (ref 38–126)
Bilirubin, Direct: 1.4 mg/dL — ABNORMAL HIGH (ref 0.0–0.2)
Indirect Bilirubin: 1.8 mg/dL — ABNORMAL HIGH (ref 0.3–0.9)
Total Bilirubin: 3.2 mg/dL — ABNORMAL HIGH (ref 0.3–1.2)
Total Protein: 6.6 g/dL (ref 6.5–8.1)

## 2021-05-29 LAB — BRAIN NATRIURETIC PEPTIDE: B Natriuretic Peptide: >4500 pg/mL — ABNORMAL HIGH (ref 0.0–100.0)

## 2021-05-29 MED ORDER — APIXABAN 5 MG PO TABS
5.0000 mg | ORAL_TABLET | Freq: Two times a day (BID) | ORAL | Status: DC
  Administered 2021-05-29 – 2021-06-03 (×10): 5 mg via ORAL
  Filled 2021-05-29 (×10): qty 1

## 2021-05-29 MED ORDER — ONDANSETRON HCL 4 MG/2ML IJ SOLN
4.0000 mg | Freq: Four times a day (QID) | INTRAMUSCULAR | Status: DC | PRN
Start: 2021-05-29 — End: 2021-06-03

## 2021-05-29 MED ORDER — ACETAMINOPHEN 650 MG RE SUPP: 650.0000 mg | Freq: Four times a day (QID) | RECTAL | Status: DC | PRN

## 2021-05-29 MED ORDER — TAMSULOSIN HCL 0.4 MG PO CAPS: 0.4000 mg | ORAL_CAPSULE | Freq: Every day | ORAL | Status: DC

## 2021-05-29 MED ORDER — FUROSEMIDE 10 MG/ML IJ SOLN
40.0000 mg | Freq: Two times a day (BID) | INTRAMUSCULAR | Status: DC
  Administered 2021-05-29: 40 mg via INTRAVENOUS
  Filled 2021-05-29: qty 4

## 2021-05-29 MED ORDER — INSULIN ASPART 100 UNIT/ML IJ SOLN
0.0000 [IU] | Freq: Three times a day (TID) | INTRAMUSCULAR | Status: DC
  Administered 2021-05-30 – 2021-05-31 (×5): 2 [IU] via SUBCUTANEOUS
  Administered 2021-06-01: 1 [IU] via SUBCUTANEOUS
  Administered 2021-06-01: 2 [IU] via SUBCUTANEOUS
  Administered 2021-06-01 – 2021-06-02 (×3): 1 [IU] via SUBCUTANEOUS
  Administered 2021-06-02 – 2021-06-03 (×3): 2 [IU] via SUBCUTANEOUS
  Filled 2021-05-29 (×14): qty 1

## 2021-05-29 MED ORDER — FUROSEMIDE 10 MG/ML IJ SOLN
40.0000 mg | Freq: Once | INTRAMUSCULAR | Status: AC
Start: 2021-05-29 — End: 2021-05-29
  Administered 2021-05-29: 40 mg via INTRAVENOUS
  Filled 2021-05-29: qty 4

## 2021-05-29 MED ORDER — ONDANSETRON HCL 4 MG PO TABS: 4.0000 mg | ORAL_TABLET | Freq: Four times a day (QID) | ORAL | Status: DC | PRN

## 2021-05-29 MED ORDER — BISACODYL 5 MG PO TBEC: 5.0000 mg | DELAYED_RELEASE_TABLET | Freq: Every day | ORAL | Status: DC | PRN

## 2021-05-29 MED ORDER — SODIUM CHLORIDE 0.9% FLUSH
3.0000 mL | Freq: Two times a day (BID) | INTRAVENOUS | Status: DC
  Administered 2021-05-29 – 2021-06-03 (×10): 3 mL via INTRAVENOUS

## 2021-05-29 MED ORDER — CARVEDILOL 3.125 MG PO TABS
3.1250 mg | ORAL_TABLET | Freq: Two times a day (BID) | ORAL | Status: DC
  Administered 2021-05-30 – 2021-06-01 (×5): 3.125 mg via ORAL
  Filled 2021-05-29 (×5): qty 1

## 2021-05-29 MED ORDER — DOCUSATE SODIUM 100 MG PO CAPS
100.0000 mg | ORAL_CAPSULE | Freq: Two times a day (BID) | ORAL | Status: DC
  Administered 2021-05-29 – 2021-06-03 (×9): 100 mg via ORAL
  Filled 2021-05-29 (×10): qty 1

## 2021-05-29 MED ORDER — SPIRONOLACTONE 25 MG PO TABS
25.0000 mg | ORAL_TABLET | Freq: Every day | ORAL | Status: DC
  Administered 2021-05-30 – 2021-06-03 (×5): 25 mg via ORAL
  Filled 2021-05-29 (×6): qty 1

## 2021-05-29 MED ORDER — ATORVASTATIN CALCIUM 20 MG PO TABS
40.0000 mg | ORAL_TABLET | Freq: Every day | ORAL | Status: DC
Start: 2021-05-30 — End: 2021-06-03
  Administered 2021-05-30 – 2021-06-03 (×4): 40 mg via ORAL
  Filled 2021-05-29 (×6): qty 2

## 2021-05-29 MED ORDER — VALACYCLOVIR HCL 500 MG PO TABS: 1000.0000 mg | ORAL_TABLET | Freq: Two times a day (BID) | ORAL | Status: DC

## 2021-05-29 MED ORDER — ACETAMINOPHEN 325 MG PO TABS: 650.0000 mg | ORAL_TABLET | Freq: Four times a day (QID) | ORAL | Status: DC | PRN

## 2021-05-29 MED ORDER — POLYETHYLENE GLYCOL 3350 17 G PO PACK: 17.0000 g | PACK | Freq: Every day | ORAL | Status: DC | PRN

## 2021-05-29 MED ORDER — HYDRALAZINE HCL 20 MG/ML IJ SOLN: 5.0000 mg | INTRAMUSCULAR | Status: DC | PRN

## 2021-05-29 MED ORDER — TRAZODONE HCL 50 MG PO TABS
25.0000 mg | ORAL_TABLET | Freq: Every evening | ORAL | Status: DC | PRN
  Administered 2021-05-30 – 2021-06-01 (×3): 25 mg via ORAL
  Filled 2021-05-29 (×3): qty 1

## 2021-05-29 NOTE — ED Notes (Signed)
Report from Angelyn Punt, RN ?

## 2021-05-29 NOTE — ED Provider Notes (Signed)
? ?[facility] ?Provider Note ? ? ? Event Date/Time  ? First MD Initiated Contact with Patient 05/29/21 1656   ?  (approximate) ? ? ?History  ? ?Shortness of Breath ? ? ?HPI ? ?[name]. is a 76 y.o. male with past medical history of hypertension, hyperlipidemia, systolic heart failure, here with lower extremity swelling, weakness, shortness of breath.  The patient states that he has actually felt generally unwell for the last several months.  He states that over the last week or 2, he has noticed progressively worsening dyspnea, which he initially was having with exertion only but now is present at rest.  He has had orthopnea.  He said increased leg swelling and abdominal distention.  He said increased cough.  States he now essentially cannot walk across the room without getting very winded.  He had recently seen his PCP and was prescribed Lasix, but was not able to start these yet.  He saw his physician today who sent him in for evaluation.  He was hypoxic prior to arrival.  Denies any fevers. ?  ? ? ?Physical Exam  ? ?Triage Vital Signs: ?ED Triage Vitals  ?Enc Vitals Group  ?   BP 05/29/21 1549 130/77  ?   Pulse Rate 05/29/21 1551 86  ?   Resp 05/29/21 1547 20  ?   Temp 05/29/21 1547 97.8 ?F (36.6 ?C)  ?   Temp Source 05/29/21 1547 Oral  ?   SpO2 05/29/21 1549 99 %  ?   Weight 05/29/21 1547 184 lb (83.5 kg)  ?   Height 05/29/21 1547 '5\' 9"'$  (1.753 m)  ?   Head Circumference --   ?   Peak Flow --   ?   Pain Score 05/29/21 1547 0  ?   Pain Loc --   ?   Pain Edu? --   ?   Excl. in [place]? --   ? ? ?Most recent vital signs: ?Vitals:  ? 05/30/21 0818 05/30/21 1137  ?BP: 128/85 124/82  ?Pulse: (!) 40 88  ?Resp:  18  ?Temp:  97.9 ?F (36.6 ?C)  ?SpO2: 99% 99%  ? ? ? ?General: Awake, no distress.  ?CV:  Good peripheral perfusion.  Regular rhythm. ?Resp:  Normal effort.  Bibasilar Rales, with diminished breath sounds in the bilateral bases. ?Abd:  No distention.  No tenderness. ?Other:  2+ pitting  edema bilateral lower extremities ? ? ?ED Results / Procedures / Treatments  ? ?Labs ?(all labs ordered are listed, but only abnormal results are displayed) ?Labs Reviewed  ?BASIC METABOLIC PANEL - Abnormal; Notable for the following components:  ?    Result Value  ? Sodium 147 (*)   ? Chloride 113 (*)   ? CO2 20 (*)   ? Glucose, Bld 214 (*)   ? BUN 45 (*)   ? Creatinine, Ser 2.24 (*)   ? GFR, Estimated 30 (*)   ? All other components within normal limits  ?CBC - Abnormal; Notable for the following components:  ? RBC 3.96 (*)   ? MCV 106.8 (*)   ? RDW 19.0 (*)   ? Platelets 105 (*)   ? All other components within normal limits  ?BRAIN NATRIURETIC PEPTIDE - Abnormal; Notable for the following components:  ? B Natriuretic Peptide >4,500.0 (*)   ? All other components within normal limits  ?HEPATIC FUNCTION PANEL - Abnormal; Notable for the following components:  ? Albumin 3.1 (*)   ?  AST 55 (*)   ? ALT 53 (*)   ? Total Bilirubin 3.2 (*)   ? Bilirubin, Direct 1.4 (*)   ? Indirect Bilirubin 1.8 (*)   ? All other components within normal limits  ?BASIC METABOLIC PANEL - Abnormal; Notable for the following components:  ? Chloride 114 (*)   ? Glucose, Bld 164 (*)   ? BUN 48 (*)   ? Creatinine, Ser 2.17 (*)   ? Calcium 8.6 (*)   ? GFR, Estimated 31 (*)   ? All other components within normal limits  ?CBC - Abnormal; Notable for the following components:  ? RBC 3.68 (*)   ? Hemoglobin 12.2 (*)   ? HCT 38.8 (*)   ? MCV 105.4 (*)   ? RDW 18.7 (*)   ? Platelets 89 (*)   ? All other components within normal limits  ?GLUCOSE, CAPILLARY - Abnormal; Notable for the following components:  ? Glucose-Capillary 152 (*)   ? All other components within normal limits  ?GLUCOSE, CAPILLARY - Abnormal; Notable for the following components:  ? Glucose-Capillary 181 (*)   ? All other components within normal limits  ?TROPONIN I (HIGH SENSITIVITY) - Abnormal; Notable for the following components:  ? Troponin I (High Sensitivity) 140 (*)   ? All  other components within normal limits  ?TROPONIN I (HIGH SENSITIVITY) - Abnormal; Notable for the following components:  ? Troponin I (High Sensitivity) 144 (*)   ? All other components within normal limits  ?RESP PANEL BY RT-PCR (FLU A&B, COVID) ARPGX2  ?MAGNESIUM  ? ? ? ?EKG ?Atrial fibrillation, ventricular rate 108.  QRS 1 2, QTc 482.  No acute ST elevations or depressions.  Nonspecific T wave changes, likely rate related. ? ? ?RADIOLOGY ?Chest x-ray: Left pleural effusion, persistent opacification of left lower lobe, atelectasis versus pneumonia ? ? ?I also independently reviewed and agree wit radiologist interpretations. ? ? ?PROCEDURES: ? ?Critical Care performed: No ? ?.1-3 Lead EKG Interpretation ?Performed by: Duffy Bruce, MD ?Authorized by: Duffy Bruce, MD  ? ?  Interpretation: normal   ?  ECG rate:  80-90 ?  ECG rate assessment: normal   ?  Rhythm: sinus rhythm   ?  Ectopy: none   ?  Conduction: normal   ?Comments:  ?   Indication: weakness ? ? ? ?MEDICATIONS ORDERED IN ED: ?Medications  ?atorvastatin (LIPITOR) tablet 40 mg (40 mg Oral Given 05/30/21 0937)  ?carvedilol (COREG) tablet 3.125 mg (3.125 mg Oral Given 05/30/21 0937)  ?apixaban (ELIQUIS) tablet 5 mg (5 mg Oral Given 05/30/21 0937)  ?sodium chloride flush (NS) 0.9 % injection 3 mL (3 mLs Intravenous Given 05/30/21 0940)  ?acetaminophen (TYLENOL) tablet 650 mg (has no administration in time range)  ?  Or  ?acetaminophen (TYLENOL) suppository 650 mg (has no administration in time range)  ?docusate sodium (COLACE) capsule 100 mg (100 mg Oral Given 05/30/21 0937)  ?polyethylene glycol (MIRALAX / GLYCOLAX) packet 17 g (has no administration in time range)  ?bisacodyl (DULCOLAX) EC tablet 5 mg (has no administration in time range)  ?ondansetron (ZOFRAN) tablet 4 mg (has no administration in time range)  ?  Or  ?ondansetron (ZOFRAN) injection 4 mg (has no administration in time range)  ?hydrALAZINE (APRESOLINE) injection 5 mg (has no administration  in time range)  ?insulin aspart (novoLOG) injection 0-9 Units (2 Units Subcutaneous Given 05/30/21 1219)  ?traZODone (DESYREL) tablet 25 mg (has no administration in time range)  ?spironolactone (ALDACTONE) tablet 25 mg (25  mg Oral Given 05/30/21 0937)  ?diphenhydrAMINE (BENADRYL) capsule 25 mg (25 mg Oral Patient Refused/Not Given 05/30/21 0942)  ?furosemide (LASIX) injection 80 mg (80 mg Intravenous Given 05/30/21 0936)  ?potassium chloride SA (KLOR-CON M) CR tablet 40 mEq (40 mEq Oral Given 05/30/21 0937)  ?vitamin B-12 (CYANOCOBALAMIN) tablet 1,000 mcg (1,000 mcg Oral Given 05/30/21 0937)  ?furosemide (LASIX) injection 40 mg (40 mg Intravenous Given 05/29/21 1756)  ? ? ? ?IMPRESSION / MDM / ASSESSMENT AND PLAN / ED COURSE  ?I reviewed the triage vital signs and the nursing notes. ?             ?               ? ? ?The patient is on the cardiac monitor to evaluate for evidence of arrhythmia and/or significant heart rate changes. ? ? ?Ddx:  ?CHF exacerbation, PNA, COVID-19, anemia, ACS ? ? ?MDM:  ?76 yo M with h/o CHF, HTN, here with leg edema, SOB. Pt newly hypoxic with bilateral rales, pitting edema. Suspect acute CHF exacerbation. CXR shows edema, b/l effusions. Unlikely infectious - no fever or infectious sx. WBC normal. No significant anemia. CKD near baseline. Trop, BNP elevated c/w likely CHF with demand. NO signs of ACS/ischemia on EKG. No arrhythmia on tele. Will give IV lasix, admit to medicine. Pt in agreement.  ? ?Reviewed prior Cardiology notes, PCP notes.  ? ?MEDICATIONS GIVEN IN ED: ?Medications  ?atorvastatin (LIPITOR) tablet 40 mg (40 mg Oral Given 05/30/21 0937)  ?carvedilol (COREG) tablet 3.125 mg (3.125 mg Oral Given 05/30/21 0937)  ?apixaban (ELIQUIS) tablet 5 mg (5 mg Oral Given 05/30/21 0937)  ?sodium chloride flush (NS) 0.9 % injection 3 mL (3 mLs Intravenous Given 05/30/21 0940)  ?acetaminophen (TYLENOL) tablet 650 mg (has no administration in time range)  ?  Or  ?acetaminophen (TYLENOL) suppository  650 mg (has no administration in time range)  ?docusate sodium (COLACE) capsule 100 mg (100 mg Oral Given 05/30/21 0937)  ?polyethylene glycol (MIRALAX / GLYCOLAX) packet 17 g (has no administration i

## 2021-05-29 NOTE — Progress Notes (Addendum)
NYRON, [name] ([id number]) ?Visit Report for 05/29/2021 ?Arrival Information Details ?Patient Name: [name], [name]. ?Date of Service: 05/29/2021 2:45 PM ?Medical Record Number: [id number] ?Patient Account Number: [id number] ?Date of Birth/Sex: [birth year] (76 y.o. M) ?Treating RN: Carlene Coria ?Primary Care Mayela Bullard: Tommi Rumps Other Clinician: ?Referring Seher Schlagel: Tommi Rumps ?Treating Ambre Kobayashi/Extender: Jeri Cos ?Weeks in Treatment: 9 ?Visit Information History Since Last Visit ?All ordered tests and consults were completed: No ?Patient Arrived: [name] ?Added or deleted any medications: No ?Arrival Time: 15:00 ?Any new allergies or adverse reactions: No ?Accompanied By: self ?Had a fall or experienced change in No ?Transfer Assistance: None ?activities of daily living that may affect ?Patient Identification Verified: Yes ?risk of falls: ?Secondary Verification Process Completed: Yes ?Signs or symptoms of abuse/neglect since last visito No ?Patient Requires Transmission-Based Precautions: No ?Hospitalized since last visit: No ?Patient Has Alerts: No ?Implantable device outside of the clinic excluding No ?cellular tissue based products placed in the center ?since last visit: ?Has Dressing in Place as Prescribed: Yes ?Pain Present Now: No ?Electronic Signature(s) ?Signed: 05/31/2021 8:47:39 AM By: Carlene Coria RN ?Entered By: Carlene Coria on 05/29/2021 15:08:10 ?MARISOL, GLAZER ([id number]) ?-------------------------------------------------------------------------------- ?Clinic Level of Care Assessment Details ?Patient Name: [name], [name]. ?Date of Service: 05/29/2021 2:45 PM ?Medical Record Number: [id number] ?Patient Account Number: [id number] ?Date of Birth/Sex: Mar 09, 1945 (76 y.o. M) ?Treating RN: Carlene Coria ?Primary Care Eshaal Duby: Tommi Rumps Other Clinician: ?Referring Miklos Bidinger: Tommi Rumps ?Treating Jenea Dake/Extender: Jeri Cos ?Weeks in Treatment: 9 ?Clinic Level of Care Assessment  Items ?TOOL 4 Quantity Score ?X - Use when only an EandM is performed on FOLLOW-UP visit 1 0 ?ASSESSMENTS - Nursing Assessment / Reassessment ?X - Reassessment of Co-morbidities (includes updates in patient status) 1 10 ?X- 1 5 ?Reassessment of Adherence to Treatment Plan ?ASSESSMENTS - Wound and Skin Assessment / Reassessment ?X - Simple Wound Assessment / Reassessment - one wound 1 5 ?'[]'$  - 0 ?Complex Wound Assessment / Reassessment - multiple wounds ?'[]'$  - 0 ?Dermatologic / Skin Assessment (not related to wound area) ?ASSESSMENTS - Focused Assessment ?'[]'$  - Circumferential Edema Measurements - multi extremities 0 ?'[]'$  - 0 ?Nutritional Assessment / Counseling / Intervention ?'[]'$  - 0 ?Lower Extremity Assessment (monofilament, tuning fork, pulses) ?'[]'$  - 0 ?Peripheral Arterial Disease Assessment (using hand held doppler) ?ASSESSMENTS - Ostomy and/or Continence Assessment and Care ?'[]'$  - Incontinence Assessment and Management 0 ?'[]'$  - 0 ?Ostomy Care Assessment and Management (repouching, etc.) ?PROCESS - Coordination of Care ?X - Simple Patient / Family Education for ongoing care 1 15 ?'[]'$  - 0 ?Complex (extensive) Patient / Family Education for ongoing care ?'[]'$  - 0 ?Staff obtains Consents, Records, Test Results / Process Orders ?'[]'$  - 0 ?Staff telephones HHA, Nursing Homes / Clarify orders / etc ?'[]'$  - 0 ?Routine Transfer to another Facility (non-emergent condition) ?'[]'$  - 0 ?Routine Hospital Admission (non-emergent condition) ?'[]'$  - 0 ?New Admissions / Biomedical engineer / Ordering NPWT, Apligraf, etc. ?'[]'$  - 0 ?Emergency Hospital Admission (emergent condition) ?X- 1 10 ?Simple Discharge Coordination ?'[]'$  - 0 ?Complex (extensive) Discharge Coordination ?PROCESS - Special Needs ?'[]'$  - Pediatric / Minor Patient Management 0 ?'[]'$  - 0 ?Isolation Patient Management ?'[]'$  - 0 ?Hearing / Language / Visual special needs ?'[]'$  - 0 ?Assessment of Community assistance (transportation, D/C planning, etc.) ?'[]'$  - 0 ?Additional assistance /  Altered mentation ?'[]'$  - 0 ?Support Surface(s) Assessment (bed, cushion, seat, etc.) ?INTERVENTIONS - Wound Cleansing / Measurement ?[name], [name] ([id number]) ?X- 1 5 ?Simple Wound Cleansing -  one wound ?'[]'$  - 0 ?Complex Wound Cleansing - multiple wounds ?X- 1 5 ?Wound Imaging (photographs - any number of wounds) ?'[]'$  - 0 ?Wound Tracing (instead of photographs) ?X- 1 5 ?Simple Wound Measurement - one wound ?'[]'$  - 0 ?Complex Wound Measurement - multiple wounds ?INTERVENTIONS - Wound Dressings ?X - Small Wound Dressing one or multiple wounds 1 10 ?'[]'$  - 0 ?Medium Wound Dressing one or multiple wounds ?'[]'$  - 0 ?Large Wound Dressing one or multiple wounds ?'[]'$  - 0 ?Application of Medications - topical ?'[]'$  - 0 ?Application of Medications - injection ?INTERVENTIONS - Miscellaneous ?'[]'$  - External ear exam 0 ?'[]'$  - 0 ?Specimen Collection (cultures, biopsies, blood, body fluids, etc.) ?'[]'$  - 0 ?Specimen(s) / Culture(s) sent or taken to Lab for analysis ?'[]'$  - 0 ?Patient Transfer (multiple staff / Civil Service fast streamer / Similar devices) ?'[]'$  - 0 ?Simple Staple / Suture removal (25 or less) ?'[]'$  - 0 ?Complex Staple / Suture removal (26 or more) ?'[]'$  - 0 ?Hypo / Hyperglycemic Management (close monitor of Blood Glucose) ?'[]'$  - 0 ?Ankle / Brachial Index (ABI) - do not check if billed separately ?X- 1 5 ?Vital Signs ?Has the patient been seen at the hospital within the last three years: Yes ?Total Score: 75 ?Level Of Care: New/Established - Level ?2 ?Electronic Signature(s) ?Signed: 05/31/2021 8:47:39 AM By: Carlene Coria RN ?Entered By: Carlene Coria on 05/29/2021 16:03:41 ?[name], [name] ([id number]) ?-------------------------------------------------------------------------------- ?Encounter Discharge Information Details ?Patient Name: [name], [name]. ?Date of Service: 05/29/2021 2:45 PM ?Medical Record Number: [id number] ?Patient Account Number: [id number] ?Date of Birth/Sex: 12-Jul-1945 (76 y.o. M) ?Treating RN: Carlene Coria ?Primary Care Kerissa Coia:  Tommi Rumps Other Clinician: ?Referring Trenna Kiely: Tommi Rumps ?Treating Debra Calabretta/Extender: Jeri Cos ?Weeks in Treatment: 9 ?Encounter Discharge Information Items ?Discharge Condition: Stable ?Ambulatory Status: [name] ?Discharge Destination: Emergency Room ?Telephoned: No ?Orders Sent: Yes ?Transportation: Ambulance ?Accompanied By: self ?Schedule Follow-up Appointment: Yes ?Clinical Summary of Care: Patient Declined ?Electronic Signature(s) ?Signed: 05/29/2021 4:05:20 PM By: Carlene Coria RN ?Entered By: Carlene Coria on 05/29/2021 16:05:20 ?[name], [name] ([id number]) ?-------------------------------------------------------------------------------- ?Lower Extremity Assessment Details ?Patient Name: [name], [name]. ?Date of Service: 05/29/2021 2:45 PM ?Medical Record Number: [id number] ?Patient Account Number: [id number] ?Date of Birth/Sex: 06-02-1945 (76 y.o. M) ?Treating RN: Carlene Coria ?Primary Care Princessa Lesmeister: Tommi Rumps Other Clinician: ?Referring Pearly Apachito: Tommi Rumps ?Treating Dequan Kindred/Extender: Jeri Cos ?Weeks in Treatment: 9 ?Edema Assessment ?Assessed: [Left: No] [Right: No] ?Edema: [Left: Ye] [Right: s] ?Calf ?Left: Right: ?Point of Measurement: 35 cm From Medial Instep 30 cm ?Ankle ?Left: Right: ?Point of Measurement: 10 cm From Medial Instep 21 cm ?Electronic Signature(s) ?Signed: 05/29/2021 4:01:03 PM By: Carlene Coria RN ?Entered By: Carlene Coria on 05/29/2021 16:01:03 ?[name], [name] ([id number]) ?-------------------------------------------------------------------------------- ?Multi Wound Chart Details ?Patient Name: [name], [name]. ?Date of Service: 05/29/2021 2:45 PM ?Medical Record Number: [id number] ?Patient Account Number: [id number] ?Date of Birth/Sex: 1945-02-18 (76 y.o. M) ?Treating RN: Carlene Coria ?Primary Care Brittiany Wiehe: Tommi Rumps Other Clinician: ?Referring Trayquan Kolakowski: Tommi Rumps ?Treating Shermar Friedland/Extender: Jeri Cos ?Weeks in Treatment: 9 ?Vital  Signs ?Height(in): ?Pulse(bpm): 114 ?Weight(lbs): 183 ?Blood Pressure(mmHg): 161/85 ?Body Mass Index(BMI): ?Temperature(??F): 98.4 ?Respiratory Rate(breaths/min): 24 ?Photos: [2:No Photos] [N/A:N/A] ?Wound Location: [2:Left, Posteri

## 2021-05-29 NOTE — Progress Notes (Addendum)
DEANTHONY, MAULL ([id number]) ?Visit Report for 05/29/2021 ?Chief Complaint Document Details ?Patient Name: [name], [name]. ?Date of Service: 05/29/2021 2:45 PM ?Medical Record Number: [id number] ?Patient Account Number: [id number] ?Date of Birth/Sex: 05/29/1945 (76 y.o. M) ?Treating RN: Carlene Coria ?Primary Care Provider: Tommi Rumps Other Clinician: ?Referring Provider: Tommi Rumps ?Treating Provider/Extender: Jeri Cos ?Weeks in Treatment: 9 ?Information Obtained from: Patient ?Chief Complaint ?Left leg ulcer ?Electronic Signature(s) ?Signed: 05/29/2021 2:46:53 PM By: Worthy Keeler PA-C ?Entered By: Worthy Keeler on 05/29/2021 14:46:53 ?[name], [name] ([id number]) ?-------------------------------------------------------------------------------- ?HPI Details ?Patient Name: [name], [name]. ?Date of Service: 05/29/2021 2:45 PM ?Medical Record Number: [id number] ?Patient Account Number: [id number] ?Date of Birth/Sex: 1945-03-13 (76 y.o. M) ?Treating RN: Carlene Coria ?Primary Care Provider: Tommi Rumps Other Clinician: ?Referring Provider: Tommi Rumps ?Treating Provider/Extender: Jeri Cos ?Weeks in Treatment: 9 ?History of Present Illness ?HPI Description: 03/27/2021 upon evaluation today patient appears to be doing well with regard to his wound. This was actually an issue that he ?has had since November 2022. He does have evidence of leg swelling and even early signs of lymphedema. Subsequently he tells me that his ?daughter has been putting some "green-cream" on this. I am not sure what that is but nonetheless he does seem to have a lot of healing ?compared to what he had previous. Fortunately there does not appear to be any signs of active infection locally nor systemically at this time which ?is great news. Honestly as dry as his leg is and even the wound bed I think that Xeroform gauze would probably be an awesome option for him to ?try to help with getting this area to heal  appropriately. ?Patient does have a history of diabetes mellitus type 2, chronic venous insufficiency, hypertension, chronic kidney disease stage III, congestive ?heart failure, and coronary artery disease. ?04/03/2021 upon evaluation today patient's legs actually appear to be doing great his left leg where the wounds are is actually measuring a bit ?smaller and the wounds themselves are definitely smaller. I am actually extremely pleased with where we stand today and I see no signs of active ?infection locally or systemically at this time which is great news. No fevers, chills, nausea, vomiting, or diarrhea. ?04/10/2021 upon evaluation today patient's wounds are actually showing signs of improvement both anterior and posterior. I think that he is making ?excellent progress and overall I see no need for any major changes here at this point. ?3/13; patient presents for follow-up. He has no issues or complaints today. He reports developing shingles to his chest with some open draining ?vesicles. He does have some scabbed over lesions. He saw his primary care provider for this issue. He was started on medication. His daughter ?has been doing the dressing changing to his left leg. He has no issues or complaints about the leg wound. ?04/24/2021 upon evaluation today patient appears to be doing well with regard to his leg ulcers. Both are showing signs of improvement with new ?epithelial growth which is great news. Overall very pleased in that regard. ?05/01/2021 upon evaluation today patient appears to be doing well with regard to his wound. He has been tolerating the dressing changes without ?complication. Fortunately I do not see any evidence of active infection locally nor systemically which is great news. No fevers, chills, nausea, ?vomiting, or diarrhea. ?05/08/2021 upon evaluation today patient's anterior leg actually appears to be doing quite well. I am very pleased in that regard. I do not see any ?signs of active  infection at this time which is great news. ?05-15-2021 upon evaluation today patient's wound on the anterior leg is still healed the posterior is looking better though not significantly smaller ?yet we just switch to collagen last week. Overall I think we definitely want to give this a little bit more time before making any major adjustments ?in my opinion. ?05-22-2021 upon evaluation today patient appears to be doing excellent in regard to the anterior leg this is still healed. In regard to the posterior ?leg is doing better it is very slow but nonetheless making progress little by little. Fortunately I do not see any evidence of active infection locally ?nor systemically at this time. ?05-29-2021 upon evaluation today patient unfortunately is having a significant issue with his breathing upon evaluation today. He presents for ?evaluation here in the clinic with a pulse ox running around the low to mid 80s on room air when he comes into the room. We were able to get ?this under better control once I got him on 2 L of oxygen he was able to get up to around 98 to 99% for the most part and his heart rate dropped ?from around the 130s down into the high 80s low 90s. Obviously this was a good improvement but nonetheless this has been going on for some ?time and getting worse I am concerned about a CHF exacerbation. ?Electronic Signature(s) ?Signed: 05/29/2021 4:57:01 PM By: Worthy Keeler PA-C ?Entered By: Worthy Keeler on 05/29/2021 16:57:01 ?[name], [name] ([id number]) ?-------------------------------------------------------------------------------- ?Physical Exam Details ?Patient Name: [name], [name]. ?Date of Service: 05/29/2021 2:45 PM ?Medical Record Number: [id number] ?Patient Account Number: [id number] ?Date of Birth/Sex: 1945-08-26 (76 y.o. M) ?Treating RN: Carlene Coria ?Primary Care Provider: Tommi Rumps Other Clinician: ?Referring Provider: Tommi Rumps ?Treating Provider/Extender: Jeri Cos ?Weeks in  Treatment: 9 ?Constitutional ?Well-nourished and well-hydrated in no acute distress. ?Respiratory ?labored, rapid respiration Without evidence of wheezing, rhonchi, or rales.Marland Kitchen ?Psychiatric ?this patient is able to make decisions and demonstrates good insight into disease process. Alert and Oriented x 3. pleasant and cooperative. ?Notes ?Upon inspection patient's wound bed actually showed signs of good granulation and epithelization at this point. Fortunately I do not see any ?evidence of active infection locally or systemically which is great news and overall I am extremely pleased with where things stand. No fevers, ?chills, nausea, vomiting, or diarrhea. ?Electronic Signature(s) ?Signed: 05/29/2021 4:58:40 PM By: Worthy Keeler PA-C ?Previous Signature: 05/29/2021 4:57:19 PM Version By: Worthy Keeler PA-C ?Entered By: Worthy Keeler on 05/29/2021 16:58:40 ?[name], [name] ([id number]) ?-------------------------------------------------------------------------------- ?Physician Orders Details ?Patient Name: [name], [name]. ?Date of Service: 05/29/2021 2:45 PM ?Medical Record Number: [id number] ?Patient Account Number: [id number] ?Date of Birth/Sex: 1945-09-16 (76 y.o. M) ?Treating RN: Carlene Coria ?Primary Care Provider: Tommi Rumps Other Clinician: ?Referring Provider: Tommi Rumps ?Treating Provider/Extender: Jeri Cos ?Weeks in Treatment: 9 ?Verbal / Phone Orders: No ?Diagnosis Coding ?ICD-10 Coding ?Code Description ?E11.622 Type 2 diabetes mellitus with other skin ulcer ?I87.2 Venous insufficiency (chronic) (peripheral) ?Q00.867 Non-pressure chronic ulcer of other part of left lower leg with fat layer exposed ?I10 Essential (primary) hypertension ?N18.30 Chronic kidney disease, stage 3 unspecified ?I50.42 Chronic combined systolic (congestive) and diastolic (congestive) heart failure ?I25.10 Atherosclerotic heart disease of native coronary artery without angina pectoris ?Follow-up Appointments ?o  Return Appointment in 1 week. ?Bathing/ Shower/ Hygiene ?o May shower; gently cleanse wound with antibacterial soap, rinse and pat dry prior to dressing wounds ?Edema Control - Lymphedema / Segmental  Compressive Device / Oth

## 2021-05-29 NOTE — ED Notes (Signed)
Transport Requested  ?

## 2021-05-29 NOTE — ED Triage Notes (Signed)
Pt to ED via [facility]. Pt reports increased SOB while at PCP getting his leg wrapped. Pt RA sats 80% with EMS and placed on 2L [place]. Pt states chronic SOB that is worse with exertion. Prescribed lasix Friday and has not started prescription. Pt abdomen distended.  ?

## 2021-05-29 NOTE — H&P (Signed)
?History and Physical  ? ? ?Patient: [name]. KWI:[id number] DOB: 1946/01/03 ?DOA: 05/29/2021 ?DOS: the patient was seen and examined on 05/29/2021 ?PCP: Christopher Haven, MD  ?Patient coming from: Home - lives with wife and son; [name]: Wife, [phone] ? ? ?Chief Complaint: SOB ? ?HPI: [name]. is a 76 y.o. male with medical history significant of colon CA (2015); CAD s/p stenting; stage 3a CKD; chronic systolic CHF; HTN; venous stasis; and DM presenting with SOB.  He was seen by cardiology on 4/20 and had symptoms c/w CHF as well as new-onset afib; he was offered admission and declined.  He was changed from Lasix to torsemide 40 mg BID but did not fill it.  Amlodipine was also stopped and he was started on Eliquis.  He reports that he has been weak for a long time.  He saw cardiology last week and "was still weak."  +edema.  He got new medicines but they cost too much and he so he couldn't afford them and asked for cheaper ones and just got it today and didn't have a chance to take them.  He had colon cancer but "it's gone".  He doesn't think the [place] O2 is running but he hasn't felt SOB since he has been on it in the ER.  +orthopnea.  He doesn't sleep well ever. ? ? ? ?ER Course:  Fluid overloaded, hypoxic, not normally on home O2.  Recently diagnosed with afib.  Does not appear to clinically have PNA, likely all fluid. ? ? ? ? ?Review of Systems: As mentioned in the history of present illness. All other systems reviewed and are negative. ?Past Medical History:  ?Diagnosis Date  ? Adenocarcinoma of colon ([place]) 06/19/2013  ? a.) moderately differentiated stage IIIc (T4bN2a M0) adenocarcinoma of colon  s/p colectomy followed by chemoRx with oxaliplatin & fluorouacil.  ? Anemia   ? CAD (coronary artery disease)   ? a. 02/2015 Cath: LM nl, LAD 50/40 mid/distal, LCX 80p, RCA 40p, 30d, RPL1 40, CO 3.27, CI 1.65; b. 06/09/15 PCI: LM minor irregs, m-dLAD 50%, dLAD 40%, pLCx 80% (s/p PCI/DES 0%), pRCA 40%,  dRCA 30%, 1st RPLB 40%  ? Chronic kidney disease, stage 3a ([place])   ? Chronic systolic CHF (congestive heart failure) ([place])   ? a. 02/2015 Echo: EF 20-25%, diff HK, mod MR, mildy dil LA, mildly dil RV w/ mod RV syst dysfxn, mildly dil RA, mod TR, PASP 59mHg; b. 09/2017 Echo: EF 40-45%, diff HK. Gr1 DD. Mild to mod MR. Mildly to mod dil LA; 09/2019 Echo: EF 25-30%, glob HK, mod reduced RV fxn, sev elev PASP. Mod dil LA. Mild-mod MR.  ? Dia hypertension 07/20/2014  ? [place] complication [place]   ? Hypertensive heart disease   ? Left inguinal hernia 05/2020  ? Mixed Ischemic and Non-ischemic Cardiomyopathy   ? a.) 02/2015 EF 20-25%, diff HK; b.) 09/2016 EF 40-45%; c.) 09/2019 EF 25-30%  ? Moderate mitral regurgitation   ? a. 09/2017 Echo: Mild to mod MR; a. 09/2019 Echo: mild-mod MR.  ? Pulmonary hypertension ([facility]   ? Tubular adenoma of colon   ? ?Past Surgical History:  ?Procedure Laterality Date  ? APPENDECTOMY N/A 06/19/2017  ? [facility]geon: WConsuela Mimes MD  ? CARDIAC CATHETERIZATION Bilateral 02/24/2015  ? Procedure: Right/Left Heart Cath and Coronary Angiography;  Surgeon: MWellington Hampshire MD;  [facility];  Service: Cardiovascular;  Laterality: Bilateral;  ?  CARDIAC CATHETERIZATION N/A 06/09/2015  ? Procedure: Coronary Stent Intervention (3.0 x 12 mm Xience [place] DES to LCx);  Surgeon: Wellington Hampshire, MD;  Location: [place] [facility];  Service: Cardiovascular;  Laterality: N/A  ? COLECTOMY  06/19/2017  ? Descending and proximal sigmoid colectomy, LEFT ureterolysis, partial cecectomy, appendectomy; Location: [facility]; Surgeon: Consuela Mimes, MD  ? COLONOSCOPY    ? COLONOSCOPY WITH PROPOFOL N/A 12/11/2016  ? Procedure: COLONOSCOPY WITH PROPOFOL;  Surgeon: Lollie Sails, MD;  [facility];  Service: Endoscopy;  Laterality: N/A;  ? ESOPHAGOGASTRODUODENOSCOPY    ? PORT-A-CATH REMOVAL N/A 07/12/2014  ? Procedure: REMOVAL PORT-A-CATH;   Surgeon: Molly Maduro, MD;  Location: [facility] ORS;  Service: General;  Laterality: N/A;  ? PORTACATH PLACEMENT Left 2015  ? ?Social History:  reports that he has never smoked. He has never used smokeless tobacco. He reports that he does not currently use alcohol. He reports that he does not use drugs. ? ?Allergies  ?Allergen Reactions  ? Shellfish Allergy Other (See Comments) and Hives  ?  Pt. instructed by MD to avoid seafood  ? ? ?Family History  ?Problem Relation Age of Onset  ? Cancer Mother   ? ? ?Prior to Admission medications   ?Medication Sig Start Date End Date Taking? Authorizing Provider  ?apixaban (ELIQUIS) 5 MG TABS tablet Take 1 tablet (5 mg total) by mouth 2 (two) times daily. 05/25/21 06/24/21  Theora Gianotti, NP  ?atorvastatin (LIPITOR) 40 MG tablet TAKE 1 TABLET BY MOUTH EVERY DAY 05/17/21   Wellington Hampshire, MD  ?carvedilol (COREG) 3.125 MG tablet TAKE 1 TABLET(3.125 MG) BY MOUTH TWICE DAILY 03/11/20   Wellington Hampshire, MD  ?cholecalciferol (VITAMIN D3) 25 MCG (1000 UNIT) tablet Take 1,000 Units by mouth daily.    [provider]  ?docusate sodium (COLACE) 100 MG capsule Take 1 capsule (100 mg total) by mouth daily as needed for mild constipation. 11/29/20   Earlie Server, MD  ?Delene Loll 24-26 MG TAKE 1 TABLET BY MOUTH TWICE DAILY 09/08/20   Wellington Hampshire, MD  ?ferrous sulfate 325 (65 FE) MG EC tablet Take 1 tablet (325 mg total) by mouth 2 (two) times daily with a meal. 11/29/20   Earlie Server, MD  ?gabapentin (NEURONTIN) 100 MG capsule Take by mouth. 04/12/21 04/12/22  [provider]  ?HYDROcodone-acetaminophen (NORCO/VICODIN) 5-325 MG tablet Take 1 tablet by mouth every 6 (six) hours as needed for moderate pain. 09/07/20   Ronny Bacon, MD  ?ibuprofen (ADVIL) 800 MG tablet Take 1 tablet (800 mg total) by mouth every 8 (eight) hours as needed. 09/07/20   Ronny Bacon, MD  ?Lidocaine 4 % GEL Apply 1 application. topically 4 (four) times daily as needed. 04/14/21   Victorino Dike, FNP  ?loratadine (CLARITIN) 10 MG tablet Take 1 tablet (10 mg total) by mouth daily. 05/02/21   Christopher Haven, MD  ?metFORMIN (GLUCOPHAGE) 500 MG tablet TAKE 1 TABLET(500 MG) BY MOUTH TWICE DAILY WITH A MEAL 04/03/21   Christopher Haven, MD  ?spironolactone (ALDACTONE) 25 MG tablet TAKE 1 TABLET(25 MG) BY MOUTH DAILY 08/10/20   Wellington Hampshire, MD  ?tamsulosin (FLOMAX) 0.4 MG CAPS capsule TAKE 1 CAPSULE(0.4 MG) BY MOUTH DAILY AFTER SUPPER ?Patient taking differently: Take 0.4 mg by mouth daily after breakfast. 04/14/19   McGowan, Larene Beach A, PA-C  ?torsemide 40 MG TABS Take 40 mg by mouth 2 (two) times daily. 05/25/21 06/24/21  Theora Gianotti, NP  ?  valACYclovir (VALTREX) 1000 MG tablet Take 1 tablet (1,000 mg total) by mouth 2 (two) times daily. 04/14/21   Victorino Dike, FNP  ? ? ?Physical Exam: ?Vitals:  ? 05/29/21 1547 05/29/21 1549 05/29/21 1551  ?BP:  130/77   ?Pulse:   86  ?Resp: 20    ?Temp: 97.8 ?F (36.6 ?C)    ?TempSrc: Oral    ?SpO2:  99%   ?Weight: 83.5 kg    ?Height: '5\' 9"'$  (1.753 m)    ? ?General:  Appears calm and comfortable and is in NAD, disheveled ?Eyes:  PERRL, EOMI, normal lids, iris ?ENT:  grossly normal hearing, lips & tongue, mmm; poor dentition ?Neck:  no LAD, masses or thyromegaly ?Cardiovascular:  Irregularly irregular with intermittent mild tachycardia, no r/g, 1-0/2 systolic murmur. Extensive edema extending onto his abdominal wall.  ?Respiratory:   CTA bilaterally with no wheezes/rales/rhonchi.  Increased respiratory effort consistently in the 30s with normal sats on [place] O2. ?Abdomen:  soft, NT, ND, +abdominal wall edema ?Skin:  no rash or induration seen on limited exam ?Musculoskeletal:  grossly normal tone BUE/BLE, good ROM, no bony abnormality, Unna boots in place ?Psychiatric:  grossly normal mood and affect, speech fluent and appropriate, AOx3, ?chronic cognitive slowing ?Neurologic:  CN 2-12 grossly intact, moves all extremities in coordinated  fashion ? ? ?Radiological Exams on Admission: ?Independently reviewed - see discussion in A/P where applicable ? ?DG Chest 2 View ? ?Result Date: 05/29/2021 ?CLINICAL DATA:  Shortness of breath. EXAM: CHEST - 2 VIEW COMPARISON:  03/1

## 2021-05-30 ENCOUNTER — Inpatient Hospital Stay
Admit: 2021-05-30 | Discharge: 2021-05-30 | Disposition: A | Discharge status: Home / Self Care | Payer: PPO | Attending: Internal Medicine | Admitting: Internal Medicine

## 2021-05-30 ENCOUNTER — Encounter: Payer: Self-pay | Admitting: Internal Medicine

## 2021-05-30 DIAGNOSIS — I4819 Other persistent atrial fibrillation: Secondary | ICD-10-CM

## 2021-05-30 DIAGNOSIS — I361 Nonrheumatic tricuspid (valve) insufficiency: Secondary | ICD-10-CM | POA: Diagnosis not present

## 2021-05-30 DIAGNOSIS — I5023 Acute on chronic systolic (congestive) heart failure: Secondary | ICD-10-CM | POA: Diagnosis not present

## 2021-05-30 LAB — MAGNESIUM: Magnesium: 1.8 mg/dL (ref 1.7–2.4)

## 2021-05-30 LAB — BASIC METABOLIC PANEL
Anion gap: 6 (ref 5–15)
BUN: 48 mg/dL — ABNORMAL HIGH (ref 8–23)
CO2: 23 mmol/L (ref 22–32)
Calcium: 8.6 mg/dL — ABNORMAL LOW (ref 8.9–10.3)
Chloride: 114 mmol/L — ABNORMAL HIGH (ref 98–111)
Creatinine, Ser: 2.17 mg/dL — ABNORMAL HIGH (ref 0.61–1.24)
GFR, Estimated: 31 mL/min — ABNORMAL LOW (ref >60)
Glucose, Bld: 164 mg/dL — ABNORMAL HIGH (ref 70–99)
Potassium: 3.5 mmol/L (ref 3.5–5.1)
Sodium: 143 mmol/L (ref 135–145)

## 2021-05-30 LAB — CBC
HCT: 38.8 % — ABNORMAL LOW (ref 39.0–52.0)
Hemoglobin: 12.2 g/dL — ABNORMAL LOW (ref 13.0–17.0)
MCH: 33.2 pg (ref 26.0–34.0)
MCHC: 31.4 g/dL (ref 30.0–36.0)
MCV: 105.4 fL — ABNORMAL HIGH (ref 80.0–100.0)
Platelets: 89 10*3/uL — ABNORMAL LOW (ref 150–400)
RBC: 3.68 MIL/uL — ABNORMAL LOW (ref 4.22–5.81)
RDW: 18.7 % — ABNORMAL HIGH (ref 11.5–15.5)
WBC: 6.2 10*3/uL (ref 4.0–10.5)
nRBC: 0 % (ref 0.0–0.2)

## 2021-05-30 LAB — GLUCOSE, CAPILLARY
Glucose-Capillary: 152 mg/dL — ABNORMAL HIGH (ref 70–99)
Glucose-Capillary: 181 mg/dL — ABNORMAL HIGH (ref 70–99)
Glucose-Capillary: 155 mg/dL — ABNORMAL HIGH (ref 70–99)
Glucose-Capillary: 136 mg/dL — ABNORMAL HIGH (ref 70–99)

## 2021-05-30 LAB — ECHOCARDIOGRAM COMPLETE
AR max vel: 2.08 cm2
AV Area VTI: 2.68 cm2
AV Area mean vel: 1.65 cm2
AV Mean grad: 2 mmHg
AV Peak grad: 3 mmHg
Ao pk vel: 0.86 m/s
Area-P 1/2: 7.16 cm2
Calc EF: 16.2 %
Height: 69 in
S' Lateral: 5.7 cm
Single Plane A2C EF: -1.6 %
Single Plane A4C EF: 29.1 %
Weight: 2886.4 oz

## 2021-05-30 MED ORDER — FUROSEMIDE 10 MG/ML IJ SOLN
80.0000 mg | Freq: Two times a day (BID) | INTRAMUSCULAR | Status: DC
Start: 2021-05-30 — End: 2021-06-03
  Administered 2021-05-30 – 2021-06-03 (×9): 80 mg via INTRAVENOUS
  Filled 2021-05-30 (×10): qty 8

## 2021-05-30 MED ORDER — ENSURE ENLIVE PO LIQD
237.0000 mL | Freq: Two times a day (BID) | ORAL | Status: DC
  Administered 2021-05-30 – 2021-06-03 (×6): 237 mL via ORAL

## 2021-05-30 MED ORDER — DIPHENHYDRAMINE HCL 25 MG PO CAPS: 25.0000 mg | ORAL_CAPSULE | Freq: Once | ORAL | Status: DC

## 2021-05-30 MED ORDER — POTASSIUM CHLORIDE CRYS ER 20 MEQ PO TBCR
40.0000 meq | EXTENDED_RELEASE_TABLET | Freq: Every day | ORAL | Status: DC
  Administered 2021-05-30 – 2021-06-03 (×5): 40 meq via ORAL
  Filled 2021-05-30 (×6): qty 2

## 2021-05-30 MED ORDER — ISOSORBIDE DINITRATE 10 MG PO TABS
10.0000 mg | ORAL_TABLET | Freq: Three times a day (TID) | ORAL | Status: DC
  Administered 2021-05-30 (×2): 10 mg via ORAL
  Filled 2021-05-30 (×3): qty 1

## 2021-05-30 MED ORDER — HYDRALAZINE HCL 10 MG PO TABS
10.0000 mg | ORAL_TABLET | Freq: Three times a day (TID) | ORAL | Status: DC
  Administered 2021-05-30 (×2): 10 mg via ORAL
  Filled 2021-05-30 (×2): qty 1

## 2021-05-30 MED ORDER — ADULT MULTIVITAMIN W/MINERALS CH
1.0000 | ORAL_TABLET | Freq: Every day | ORAL | Status: DC
  Administered 2021-05-30 – 2021-06-03 (×4): 1 via ORAL
  Filled 2021-05-30 (×6): qty 1

## 2021-05-30 MED ORDER — VITAMIN B-12 1000 MCG PO TABS
1000.0000 ug | ORAL_TABLET | Freq: Every day | ORAL | Status: DC
  Administered 2021-05-30 – 2021-06-03 (×4): 1000 ug via ORAL
  Filled 2021-05-30 (×6): qty 1

## 2021-05-30 NOTE — Progress Notes (Signed)
*  PRELIMINARY RESULTS* ?Echocardiogram ?2D Echocardiogram has been performed. ? ?[name], [name] ?05/30/2021, 8:13 AM ?

## 2021-05-30 NOTE — Evaluation (Addendum)
Physical Therapy Evaluation ?Patient Details ?Name: [name]. ?MRN: [id number] ?DOB: 11-18-1945 ?Today's Date: 05/30/2021 ? ?History of Present Illness ? Patient is a 76 year old male admitted with acute on chronic systolic CHF.  He was seen by cardiology on 4/20 and had symptoms c/w CHF as well as new-onset afib; he was offered admission and declined. Medical history of colon CA , CAD s/p stenting, stage 3a CKD, chronic systolic CHF, HTN, venous stasis, DM.  ?Clinical Impression ? Patient is agreeable to PT evaluation. He reports he is usually independent with mobility at home and lives with his family. Today the patient required minimal assistance to stand and take a few steps to the chair using rolling walker. Sp02 was 89-90% on room air with mobility. He was fatigued after minimal activity and requested to defer walking at this time. He does not appear to be at his baseline level of functional mobility, however anticipate patient will be able to return home with family support and continued PT. Recommend PT follow up while in the hospital to maximize independence in preparation for home and community mobility.   ? ?Recommendations for follow up therapy are one component of a multi-disciplinary discharge planning process, led by the attending physician.  Recommendations may be updated based on patient status, additional functional criteria and insurance authorization. ? ?Follow Up Recommendations Home health PT ? ?  ?Assistance Recommended at Discharge Intermittent Supervision/Assistance  ?Patient can return home with the following ? A little help with walking and/or transfers;A little help with bathing/dressing/bathroom;Help with stairs or ramp for entrance;Assist for transportation ? ?  ?Equipment Recommendations Rolling walker (2 wheels)  ?Recommendations for Other Services ?    ?  ?Functional Status Assessment Patient has had a recent decline in their functional status and demonstrates the ability to  make significant improvements in function in a reasonable and predictable amount of time.  ? ?  ?Precautions / Restrictions Precautions ?Precautions: Fall ?Restrictions ?Weight Bearing Restrictions: No  ? ?  ? ?Mobility ? Bed Mobility ?Overal bed mobility: Needs Assistance ?Bed Mobility: Supine to Sit ?  ?  ?Supine to sit: Supervision ?  ?  ?General bed mobility comments: increased time required to complete tasks. 2 L 02 removed during parts of mobility with Sp02 88-90%. patient was fatigued with activity ?  ? ?Transfers ?Overall transfer level: Needs assistance ?Equipment used: Rolling walker (2 wheels) ?Transfers: Bed to chair/wheelchair/BSC ?  ?  ?Step pivot transfers: Min assist ?  ?  ?  ?General transfer comment: lifting assistance for standing from bed and occasional steadying with taking a few steps from bed to chair. verbal cues for technique and safety ?  ? ?Ambulation/Gait ?  ?  ?  ?  ?  ?  ?  ?General Gait Details: patient declined ambulating due to fatigue with activity ? ?Stairs ?  ?  ?  ?  ?  ? ?Wheelchair Mobility ?  ? ?Modified Rankin (Stroke Patients Only) ?  ? ?  ? ?Balance Overall balance assessment: Needs assistance ?Sitting-balance support: Feet supported ?Sitting balance-[name]: Good ?  ?  ?Standing balance support: Bilateral upper extremity supported, Reliant on assistive device for balance ?Standing balance-[name]: Fair ?Standing balance comment: with rolling walker for support in standing ?  ?  ?  ?  ?  ?  ?  ?  ?  ?  ?  ?   ? ? ? ?Pertinent Vitals/Pain Pain Assessment ?Pain Assessment: No/denies pain  ? ? ?  Home Living Family/patient expects to be discharged to:: Private residence ?Living Arrangements: Spouse/significant other (and son) ?Available Help at Discharge: Family;Available 24 hours/day ?Type of Home: House ?Home Access: Stairs to enter ?  ?Entrance Stairs-Number of Steps: 2-3 ?  ?Home Layout: One level ?Home Equipment: [name] - single point (he reports his wife has a  rolling walker and wheelchair that she uses sometimes) ?   ?  ?Prior Function Prior Level of Function : Independent/Modified Independent;History of Falls (last six months) ?  ?  ?  ?  ?  ?  ?Mobility Comments: patient is independent with ambulation, occasional use of cane only recently. one fall while mowing the yard recently, otherwise no falls. ?ADLs Comments: independent but he says he anticipates he will require some assistance now ?  ? ? ?Hand Dominance  ?   ? ?  ?Extremity/Trunk Assessment  ? Upper Extremity Assessment ?Upper Extremity Assessment: Overall WFL for tasks assessed ?  ? ?Lower Extremity Assessment ?Lower Extremity Assessment: Generalized weakness ?  ? ?   ?Communication  ? Communication: No difficulties  ?Cognition Arousal/Alertness: Awake/alert ?Behavior During Therapy: [facility] for tasks assessed/performed ?Overall Cognitive Status: Within Functional Limits for tasks assessed ?  ?  ?  ?  ?  ?  ?  ?  ?  ?  ?  ?  ?  ?  ?  ?  ?General Comments: patient is able to follow single step commands without difficulty ?  ?  ? ?  ?General Comments   ? ?  ?Exercises    ? ?Assessment/Plan  ?  ?PT Assessment Patient needs continued PT services  ?PT Problem List Decreased strength;Decreased activity tolerance;Decreased balance;Decreased mobility;Cardiopulmonary status limiting activity ? ?   ?  ?PT Treatment Interventions DME instruction;Gait training;Stair training;Functional mobility training;Therapeutic activities;Therapeutic exercise;Balance training;Neuromuscular re-education;Patient/family education   ? ?PT Goals (Current goals can be found in the Care Plan section)  ?Acute Rehab PT Goals ?Patient Stated Goal: to get better and return home ?PT Goal Formulation: With patient ?Time For Goal Achievement: 06/13/21 ?Potential to Achieve Goals: Good ? ?  ?Frequency Min 2X/week ?  ? ? ?Co-evaluation   ?  ?  ?  ?  ? ? ?  ?AM-PAC PT "6 Clicks" Mobility  ?Outcome Measure Help needed turning from your back to your side  while in a flat bed without using bedrails?: None ?Help needed moving from lying on your back to sitting on the side of a flat bed without using bedrails?: A Little ?Help needed moving to and from a bed to a chair (including a wheelchair)?: A Little ?Help needed standing up from a chair using your arms (e.g., wheelchair or bedside chair)?: A Little ?Help needed to walk in hospital room?: A Little ?Help needed climbing 3-5 steps with a railing? : A Lot ?6 Click Score: 18 ? ?  ?End of Session Equipment Utilized During Treatment: Gait belt;Oxygen ?Activity Tolerance: Patient tolerated treatment well;Patient limited by fatigue ?Patient left: in chair;with call bell/phone within reach;with chair alarm set ?Nurse Communication: Mobility status ?PT Visit Diagnosis: Muscle weakness (generalized) (M62.81);Unsteadiness on feet (R26.81) ?  ? ?Time: 2355-7322 ?PT Time Calculation (min) (ACUTE ONLY): 24 min ? ? ?Charges:   PT Evaluation ?$PT Eval Low Complexity: 1 Low ?PT Treatments ?$Therapeutic Activity: 8-22 mins ?  ?   ? ?Minna Merritts, PT, MPT ? ?Percell Locus ?05/30/2021, 11:41 AM ? ?

## 2021-05-30 NOTE — Assessment & Plan Note (Signed)
Patient seems to have chronic pancytopenia and thrombocytopenia which is being managed by hematology. ?-Continue to monitor ?

## 2021-05-30 NOTE — Progress Notes (Signed)
OT Cancellation Note ? ?Patient Details ?Name: [name]. ?MRN: [id number] ?DOB: [birth year] ? ? ?Cancelled Treatment:    Reason Eval/Treat Not Completed: Fatigue/lethargy limiting ability to participate. Order received and chart reviewed. Upon arrival pt reports recently returned to bed, reports fatigue and declines activity. Will re-attempt next date as able.  ? ?Dessie Coma, M.S. OTR/L  ?05/30/21, 1:43 PM  ?ascom [phone] ? ?
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
## 2021-05-30 NOTE — Discharge Instructions (Signed)
Low Sodium Nutrition Therapy ? ?Eating less sodium can help you if you have high blood pressure, heart failure, or kidney or liver disease. ? ?Your body needs a little sodium, but too much sodium can cause your body to hold onto extra water. This extra water will raise your blood pressure and can cause damage to your heart, kidneys, or liver as they are forced to work harder. ? ?Sometimes you can see how the extra fluid affects you because your hands, legs, or belly swell. You may also hold water around your heart and lungs, which makes it hard to breathe. ?  ?Even if you take medication for blood pressure or a water pill (diuretic) to remove fluid, it is still important to have less salt in your diet. ? ?Check with your primary care provider before drinking alcohol since it may affect the amount of fluid in your body and how your heart, kidneys, or liver work. ?  ?Sodium in Food ?A low-sodium meal plan limits the sodium that you get from food and beverages to 1,500-2,300 milligrams (mg) per day. Salt is the main source of sodium. Read the nutrition label on the package to find out how much sodium is in one serving of a food. ?Select foods with 140 milligrams (mg) of sodium or less per serving. ?You may be able to eat one or two servings of foods with a little more than 140 milligrams (mg) of sodium if you are closely watching how much sodium you eat in a day. ?Check the serving size on the label. The amount of sodium listed on the label shows the amount in one serving of the food. So, if you eat more than one serving, you will get more sodium than the amount listed. ? ?Tips ?Cutting Back on Sodium ?Eat more fresh foods.   ?Fresh fruits and vegetables are low in sodium, as well as frozen vegetables and fruits that have no added juices or sauces. ?Fresh meats are lower in sodium than processed meats, such as bacon, sausage, and hotdogs. ?Not all processed foods are unhealthy, but some processed foods may have too  much sodium. ?Eat less salt at the table and when cooking. One of the ingredients in salt is sodium. ?One teaspoon of table salt has 2,300 milligrams of sodium. ?Leave the salt out of recipes for pasta, casseroles, and soups. ?Be a Paramedic. ?Food packages that say ?Salt-free?, sodium-free?, ?very low sodium,? and ?low sodium? have less than 140 milligrams of sodium per serving. ?Beware of products identified as ?Unsalted,? ?No Salt Added,? ?Reduced Sodium,? or ?Lower Sodium.? These items may still be high in sodium. You should always check the nutrition label. ? Add flavors to your food without adding sodium. ?Try lemon juice, lime juice, or vinegar. ?Dry or fresh herbs add flavor. ?Buy a sodium-free seasoning blend or make your own at home. ?You can purchase salt-free or sodium-free condiments like barbeque sauce in stores and online. Ask your registered dietitian nutritionist for recommendations and where to find them.  ? ?Eating in Restaurants ?Choose foods carefully when you eat outside your home. Restaurant foods can be very high in sodium. Many restaurants provide nutrition facts on their menus or their websites. If you cannot find that information, ask your server. Let your server know that you want your food to be cooked without salt and that you would like your salad dressing and sauces to be served on the side.  ? ?  ?Foods to Choose or to Limit ?Food  Group Foods to Choose Foods to Limit  ?Grains Bread and rolls without salted tops ?Homemade bread made with reduced-sodium baking powder ?Cold cereals, especially shredded wheat and puffed rice ?Oats, grits, or cream of wheat ?Pastas, quinoa, and rice ?Popcorn, pretzels or crackers without salt ?Corn tortillas Breads or crackers topped with salt ?Cereals (hot/cold) with more than 300 mg sodium per serving ?Biscuits, cornbread, and other ?quick? breads prepared with baking soda ?Pre-packaged bread crumbs ?Seasoned and packaged rice and pasta  mixes ?Self-rising flours  ?Protein Foods Fresh meats and fish (check the nutrition labels - make sure they are not packaged in a sodium solution) ?Canned or packed tuna (no more than 4 ounces at 1 serving) ?Beans and peas ?Soybeans and tofu ?Eggs ?Nuts or nut butters without salt ?Sees and seed butters without salt Cured meats: Bacon, ham, sausage, pepperoni and hot dogs ?Canned meats (chili, vienna sausage, or sardines) ?Smoked fish and meats ? ?Egg substitute (with added sodium)  ?Dairy and Dairy Alternatives Milk or milk powder ?Plant milks, such as rice and soy ?Yogurt, including Greek yogurt ?Small amounts of natural cheese (blocks of cheese) or reduced-sodium cheese can be used in moderation. (Swiss, ricotta, and fresh mozzarella cheese are lower in sodium than the others) ?Cream cheese ?Low sodium cottage cheese Buttermilk ?Processed cheese spreads ?Cottage cheese (1 cup may have over 500 mg of sodium; look for low-sodium) ?American or feta cheese ?Shredded cheese has more sodium than blocks of cheese ?String cheese  ?Vegetables Fresh, frozen, and canned vegetables without added sauces or salt Canned vegetables (unless they are salt-free, sodium-free or low sodium) ?Frozen vegetables with seasoning and sauces ?Sauerkraut and pickled vegetables ?[place] fries and onion rings  ?Fruit Fresh, frozen, and canned fruits ?Dried fruits, such as raisins, cranberries, and prunes Dried fruits preserved with additives that have sodium  ?Oils Tub or liquid margarine, regular or without salt ?Canola, corn, peanut, olive, safflower, or sunflower oils Salted butter or margarine, all types of olives  ?Condiments Fresh or dried herbs such as basil, bay leaf, dill, mustard (dry), nutmeg, paprika, parsley, rosemary, sage, or thyme.             ?Low sodium ketchup ?Vinegar                                  ?Lemon or lime juice ?Pepper, red pepper flakes, and cayenne. ?Hot sauce contains sodium, but if you use just a drop or two,  it will not add up to much.                            ?Salt-free or sodium-free seasoning mixes and marinades ?Simple salad dressings: vinegar and oil Salt, sea salt, kosher salt, onion salt, and garlic salt ?Seasoning mixes with salt ?Bouillon cubes ?Ketchup ?Barbeque sauce and Worcestershire sauce unless low sodium ?Soy sauce ?Salsa, relish ?Salad dressings: ranch, blue cheese, [place], and [place].  ?Other Homemade soups (without salt) ?Low-sodium, salt-free or sodium-free canned soups Canned or dried soups (unless they are salt-free, sodium-free, or low  sodium) ?Frozen meals that have more than 600 mg of sodium per serving  ? ?Copyright 2023 Academy of Nutrition and Dietetics ?

## 2021-05-30 NOTE — Consult Note (Addendum)
? ?Cardiology Consult  ?  ?Patient ID: [name]. ?MRN: [id number], DOB/AGE: [birth year]  ? ?Admit date: 05/29/2021 ?Date of Consult: 05/30/2021 ? ?Primary Physician: Leone Haven, MD ?Primary Cardiologist: Kathlyn Sacramento, MD ?Requesting Provider: S. Reesa Chew, MD ? ?Patient Profile  ?  ?Christopher Edgett. is a 76 y.o. male with a history of CAD status post circumflex stenting, chronic HFrEF, mixed ischemic and nonischemic cardiomyopathy, hypertension, hyperlipidemia, stage III chronic kidney disease, colon cancer, and recently diagnosed persistent atrial fibrillation, who is being seen today for the evaluation of acute on chronic systolic heart failure at the request of Dr. Reesa Chew. ? ?Past Medical History  ? ?Past Medical History:  ?Diagnosis Date  ? Adenocarcinoma of colon ([place]) 06/19/2013  ? a.) moderately differentiated stage IIIc (T4bN2a M0) adenocarcinoma of colon  s/p colectomy followed by chemoRx with oxaliplatin & fluorouacil.  ? Anemia   ? CAD (coronary artery disease)   ? a. 02/2015 Cath: LM nl, LAD 50/40 mid/distal, LCX 80p, RCA 40p, 30d, RPL1 40, CO 3.27, CI 1.65; b. 06/09/15 PCI: LM minor irregs, m-dLAD 50%, dLAD 40%, pLCx 80% (s/p PCI/DES 0%), pRCA 40%, dRCA 30%, 1st RPLB 40%  ? Chronic kidney disease, stage 3a ([place])   ? Chronic systolic CHF (congestive heart failure) ([place])   ? a. 02/2015 Echo: EF 20-25%, diff HK, mod MR, mildy dil LA, mildly dil RV w/ mod RV syst dysfxn, mildly dil RA, mod TR, PASP 29mHg; b. 09/2017 Echo: EF 40-45%, diff HK. Gr1 DD. Mild to mod MR. Mildly to mod dil LA; c. 09/2019 Echo: EF 25-30%, glob HK, mod reduced RV fxn, sev elev PASP. Mod dil LA. Mild-mod MR; d. 05/2021 Echo: EF 20-25%, glob HK, sev red RV fxn, sev BAE, sev MR/TR/TS, triv AI.  ? Diabetes mellitus without complication ([place]   ? Essential hypertension   ? Hypertensive heart disease   ? Left inguinal hernia 05/2020  ? Mixed Ischemic and Non-ischemic Cardiomyopathy   ? a. 02/2015 EF 20-25%, diff HK; b. 09/2016 EF  40-45%; c. 09/2019 EF 25-30%; d. 05/2021 Echo: EF 20-25%, glob HK.  ? Moderate mitral regurgitation   ? a. 09/2017 Echo: Mild to mod MR; a. 09/2019 Echo: mild-mod MR.  ? Persistent atrial fibrillation ([place]   ? a. Dx 05/2021-->CHA2DS2VASc = 5-->eliquis.  ? Pulmonary hypertension ([place] [place]   ? Tubular adenoma of colon   ?  ?Past Surgical History:  ?Procedure Laterality Date  ? APPENDECTOMY N/A 06/19/2017  ? [facility]geon: WConsuela Mimes MD  ? CARDIAC CATHETERIZATION Bilateral 02/24/2015  ? Procedure: Right/Left Heart Cath and Coronary Angiography;  Surgeon: MWellington Hampshire MD;  [facility];  Service: Cardiovascular;  Laterality: Bilateral;  ? CARDIAC CATHETERIZATION N/A 06/09/2015  ? Procedure: Coronary Stent Intervention (3.0 x 12 mm Xience [place] DES to LCx);  Surgeon: MWellington Hampshire MD;  [facility];  Service: Cardiovascular;  Laterality: N/A  ? COLECTOMY  06/19/2017  ? Descending and proximal sigmoid colectomy, LEFT ureterolysis, partial cecectomy, appendectomy; [facility] Surgeon: WConsuela Mimes MD  ? COLONOSCOPY    ? COLONOSCOPY WITH PROPOFOL N/A 12/11/2016  ? Procedure: COLONOSCOPY WITH PROPOFOL;  Surgeon: SLollie Sails MD;  [facility];  Service: Endoscopy;  Laterality: N/A;  ? ESOPHAGOGASTRODUODENOSCOPY    ? PORT-A-CATH REMOVAL N/A 07/12/2014  ? Procedure: REMOVAL PORT-A-CATH;  Surgeon: WMolly Maduro MD;  Location: [facility];  Service: General;  Laterality: N/A;  ? PORTACATH PLACEMENT Left  2015  ?  ? ?Allergies ? ?Allergies  ?Allergen Reactions  ? Shellfish Allergy Other (See Comments) and Hives  ?  Pt. instructed by MD to avoid seafood  ? ? ?History of Present Illness  ?  ?76 year old male with the above complex past medical history including CAD status post circumflex stenting, chronic HFrEF, mixed ischemic and nonischemic cardiomyopathy, hypertension, hyperlipidemia, stage III chronic kidney disease, and colon cancer.  In  the setting of colon cancer, he underwent colectomy in 2015 followed by chemotherapy with oxaliplatin and fluorouracil.  He subsequently developed neuropathy.  In January 2017, he was diagnosed with systolic heart failure with an EF of 20 to 25% and moderate MR/TR, and moderate to severe pulmonary hypertension.  Right and left heart catheterization showed moderate to severe pulmonary hypertension with severely elevated filling pressures and severely reduced cardiac output.  Coronary angiography showed severe one-vessel CAD involving the left circumflex, and this was successfully treated with a drug-eluting stent via staged intervention in May 2017.  Follow-up echo November 2020 continue to show LV dysfunction with an EF of 30 to 35%.  He was also noted to have a 6% PVC burden by monitoring.  EF remained depressed on August 2021 echo at 25 to 30%.  He was offered electrophysiology evaluation but declined. ? ?[name] was seen in cardiology clinic on April 20 in the setting of a 30 pound weight gain since July 2022, with note of significant lower extremity, abdominal, and scrotal edema.  He also complained of progressive dyspnea on exertion.  Recent surveillance CT of the abdomen and pelvis showed anasarca without evidence of recurrent metastatic carcinoma.  He was also noted to be in new atrial fibrillation, which was reasonably rate controlled.  I offered admission however, he preferred to just outpatient diuretics first, and therefore, I switched him to torsemide 40 mg twice daily with plan to see him back this week.  Eliquis was also added.  Unfortunately, he did not fill his torsemide and over the weekend, he noted worsening dyspnea with minimal activity.  He was seen by wound care on April 24 and was noted to be dyspneic.  EMS was called and he was transferred to the ED.  Here, BNP was greater than 4500 with initial creatinine of 2.24, and mild troponin elevation with flat trend at 140  144.  He was treated  with intravenous Lasix in the emergency department and admitted for further evaluation.  He noted good urine output last night, though remains markedly volume overloaded. ? ?Inpatient Medications  ?  ? apixaban  5 mg Oral BID  ? atorvastatin  40 mg Oral Daily  ? carvedilol  3.125 mg Oral BID WC  ? diphenhydrAMINE  25 mg Oral Once  ? docusate sodium  100 mg Oral BID  ? furosemide  80 mg Intravenous BID  ? insulin aspart  0-9 Units Subcutaneous TID WC  ? potassium chloride  40 mEq Oral Daily  ? sodium chloride flush  3 mL Intravenous Q12H  ? spironolactone  25 mg Oral Daily  ? vitamin B-12  1,000 mcg Oral Daily  ? ? ?Family History  ?  ?Family History  ?Problem Relation Age of Onset  ? Cancer Mother   ? ?He indicated that his mother is deceased. He indicated that his father is deceased. ? ? ?Social History  ?  ?Social History  ? ?Socioeconomic History  ? Marital status: Married  ?  Spouse name: Romie Minus  ? Number of children: 4  ?  Years of education: Not on file  ? Highest education level: Not on file  ?Occupational History  ? Occupation: retired  ?Tobacco Use  ? Smoking status: Never  ? Smokeless tobacco: Never  ?Vaping Use  ? Vaping Use: Never used  ?Substance and Sexual Activity  ? Alcohol use: Not Currently  ?  Comment: none in many years  ? Drug use: No  ? Sexual activity: Yes  ?Other Topics Concern  ? Not on file  ?Social History Narrative  ? Not on file  ? ?Social Determinants of Health  ? ?Financial Resource Strain: Not on file  ?Food Insecurity: Not on file  ?Transportation Needs: Not on file  ?Physical Activity: Not on file  ?Stress: Not on file  ?Social Connections: Not on file  ?Intimate Partner Violence: Not on file  ?  ? ?Review of Systems  ?  ?General:  No chills, fever, night sweats or weight changes.  ?Cardiovascular:  No chest pain, +++ dyspnea on exertion, +++ lower extremity, abdominal, scrotal edema, +++ orthopnea, no palpitations, paroxysmal nocturnal dyspnea. ?Dermatological: No rash,  lesions/masses ?Respiratory: No cough, +++ dyspnea with minimal activity. ?Urologic: No hematuria, dysuria ?Abdominal:   +++ Increased abdominal girth/bloating.  No nausea, vomiting, diarrhea, bright red blood per rectum,

## 2021-05-30 NOTE — TOC Initial Note (Addendum)
Transition of Care (TOC) - Initial/Assessment Note  ? ? ?Patient Details  ?Name: [name]. ?MRN: [id number] ?Date of Birth: [birth year] ? ?Transition of Care (TOC) CM/SW Contact:    ?Candie Chroman, LCSW ?Phone Number: ?05/30/2021, 12:46 PM ? ?Clinical Narrative:   CSW met with patient. No supports at bedside. CSW introduced role and explained that PT recommendations would be discussed. Patient is agreeable to home health. No agency preference. Sent referral to [facility] for review for PT, OT, RN. He is agreeable to DME recommendation for a RW. He is not on oxygen at home but currently on 2 L. Will follow for this need as well. No further concerns. CSW encouraged patient to contact CSW as needed. CSW will continue to follow patient for support and facilitate return home when stable.            ? ?1:58 pm: [facility] is unable to accept. [facility] will review referral for PT, OT, RN, aide.   ? ?2:03 pm: [facility] can accept referral. If discharged over the weekend, they cannot see him until Monday. ? ?Expected Discharge Plan: [place] ?Barriers to Discharge: Continued Medical Work up ? ? ?Patient Goals and CMS Choice ?  ?CMS Medicare.gov Compare Post Acute Care list provided to:: Patient ?  ? ?Expected Discharge Plan and Services ?Expected Discharge Plan: [place] ?  ?  ?Post Acute Care Choice: Durable Medical Equipment, Home Health ?Living arrangements for the past 2 months: [place] ?                ?  ?  ?  ?  ?  ?  ?  ?  ?  ?  ? ?Prior Living Arrangements/Services ?Living arrangements for the past 2 months: [place] ?Lives with:: Adult Children, Spouse ?Patient language and need for interpreter reviewed:: Yes ?Do you feel safe going back to the place where you live?: Yes      ?Need for Family Participation in Patient Care: Yes (Comment) ?Care giver support system in place?: Yes (comment) ?Current home services: DME ?Criminal Activity/Legal Involvement  Pertinent to Current Situation/Hospitalization: No - Comment as needed ? ?Activities of Daily Living ?  ?  ? ?Permission Sought/Granted ?Permission sought to share information with : Customer service manager ?Permission granted to share information with : Yes, Verbal Permission Granted ?   ? Permission granted to share info w AGENCY: [facility] ?   ?   ? ?Emotional Assessment ?Appearance:: Appears stated age ?Attitude/Demeanor/Rapport: Engaged, Gracious ?Affect (typically observed): Accepting, Appropriate ?Orientation: : Oriented to Self, Oriented to Place, Oriented to  Time, Oriented to Situation ?Alcohol / Substance Use: Not Applicable ?Psych Involvement: No (comment) ? ?Admission diagnosis:  Acute on chronic systolic CHF (congestive heart failure) ([place]) [I50.23] ?Patient Active Problem List  ? Diagnosis Date Noted  ? Atrial fibrillation ([place]) 05/29/2021  ? Shingles 05/02/2021  ? Acute on chronic systolic CHF (congestive heart failure) ([place]) 04/14/2021  ? Hydrocele, left 11/01/2020  ? Status post laparoscopic hernia repair 09/27/2020  ? Anemia in chronic kidney disease 04/08/2019  ? Stage 3a chronic kidney disease ([place]) 04/08/2019  ? Hyperlipidemia 04/28/2018  ? Blistering of skin 01/23/2018  ? Leg wound, left, initial encounter 01/02/2018  ? Decreased pedal pulses 01/02/2018  ? Onychomycosis 01/02/2018  ? Leukopenia 09/28/2017  ? Thrombocytopenia ([place]) 09/28/2017  ? B12 deficiency 09/28/2017  ? Renal mass 09/10/2017  ? Diabetes ([place]) 09/10/2017  ? Leg wound,  right, initial encounter 09/10/2017  ? Acute renal failure superimposed on stage 3a chronic kidney disease ([place]) 08/14/2017  ? Anemia 09/12/2015  ? Effort angina ([place]) 06/09/2015  ? Coronary artery disease involving native coronary artery of native heart without angina pectoris   ? Hypertensive heart disease   ? CAD (coronary artery disease)   ? Mixed Ischemic and Non-ischemic Cardiomyopathy   ? Chronic systolic heart failure ([place])   ?  Bilateral lower extremity edema 02/10/2015  ? Abdominal pain 02/10/2015  ? Cough 02/10/2015  ? Neuropathy ([place]) 09/16/2014  ? Tubular adenoma of colon 07/20/2014  ? History of colon cancer 07/20/2014  ? Benign colon polyp 07/20/2014  ? Hypertension 07/20/2014  ? Hyperplastic colon polyp 07/20/2014  ? ?PCP:  Leone Haven, MD ?Pharmacy:   ?[facility] [place] [facility] ?[place] ?[place] [address] ?Phone: [phone] Fax: [phone] ? ? ? ? ?Social Determinants of Health (SDOH) Interventions ?  ? ?Readmission Risk Interventions ?   ? View : No data to display.  ?  ?  ?  ? ? ? ?

## 2021-05-30 NOTE — Consult Note (Addendum)
? ?  Heart Failure Nurse Navigator Note ? ?HFrEF 25-30%.  Left ventricular diastolic parameters are indeterminate.  Right ventricular systolic functions is moderately reduced. ?Echocardiogram performed on this admission-results are pending. ? ?He presented to the ED with complaints of worsening shortness of breath.  He was last seen in the ED on 04/14/2021 and refused admission.  Lasix was recently changed to torsemide but he had not filled it due to cost. ? ?Comorbidities: ? ?Colon cancer ?Coronary artery disease with stenting ?Chronic kidney disease stage III ?Hypertension ?Diabetes ? ?Medications: ? ?Apixaban 5 mg 2 times a day ?Lipitor 40 mg daily ?Carvedilol 3.125 mg 2 times a day with meals ?Furosemide 80 mg IV 2 times a day ?Potassium chloride 40 mEq daily ?Spironolactone 25 mg daily ? ?Labs: ? ?Sodium 143, potassium 3.5, chloride 114, CO2 23, BUN 48, creatinine 2.17, GFR 31, magnesium 1.8 ?Weight is 81.8 kg ?Intake 567 mL ?Output 345 mL ?Blood pressure 128/85 ? ? ?Initial meeting with patient on this admission.  He was sitting up in the chair at bedside waiting for staff to return him to bed as he states he been sitting up for 2 and half hours. ? ? ?He lives at home with his son and wife.  He states that he has a scale but does not weigh himself on a daily basis.  Explained the reasoning behind daily weights and what to report. ? ?Discussed his diet, he states that he still continues to use some salt, explained the reasoning for removing the saltshaker from the table and from his diet. ? ?Also discussed fluid intake states that he just drinks too much and would not quantify.  Explained the reasoning with sticking with 64 ounces or less daily. ? ?Also to keep track of any changes in signs and symptoms. ? ?He has an appointment on May 9 at 9:30 AM.  He has a 14% no-show which is 26 out of 185 appointments. ? ?He was given the living heart failure booklet, zone magnet and info on low sodium. ? ?Pricilla Riffle RN  CHFN ?

## 2021-05-30 NOTE — Assessment & Plan Note (Signed)
Blood pressure within goal. ?Holding home Entresto due to AKI. ?-Continue IV Lasix, carvedilol and spironolactone ?

## 2021-05-30 NOTE — Hospital Course (Addendum)
Taken from H&P. ? ?Jayvon Mounger. is a 76 y.o. male with medical history significant of colon CA (2015); CAD s/p stenting; stage 3a CKD; chronic systolic CHF; HTN; venous stasis; and DM presenting with SOB.  He was seen by cardiology on 4/20 and had symptoms c/w CHF as well as new-onset afib; he was offered admission and declined.  He was changed from Lasix to torsemide 40 mg BID but did not fill it.  Amlodipine was also stopped and he was started on Eliquis.  He reports that he has been weak for a long time.  He saw cardiology last week and "was still weak."  +edema.  He got new medicines but they cost too much and he so he couldn't afford them and asked for cheaper ones and just got it today and didn't have a chance to take them.  He had colon cancer but "it's gone".  He doesn't think the [place] O2 is running but he hasn't felt SOB since he has been on it in the ER.  +orthopnea.  ? ?On arrival to ED he was hemodynamically stable with tachycardia and mild tachypnea. ?He was hypoxic with no baseline oxygen use requiring up to 2 L of oxygen. ?Prior echocardiogram done in August 2021 with EF of 25 to 30%. ?Admitted for acute on chronic HFrEF and started on IV diuresis. ?  ?Labs pertinent for macrocytosis with MCV of 105-seems chronic, thrombocytopenia with platelet of 105 >>89 which also seems chronic, AKI with CKD stage IIIa with creatinine of 2.24, baseline around 1.5.  High-sensitivity troponin 140>>144, BNP markedly elevated above 4500. ? ?Repeat echocardiogram with dilated cardiomyopathy and EF of 20 to 25%. ?Also concern of cardiorenal with decreased perfusion. ?Holding home Entresto for concern of AKI-cardiology would like to restart as soon as renal function stabilizes, he might need inotropic support. ?Currently on Lasix 80 mg IV twice daily.  Not a whole lot of diuresis,only 600 ml of urine output recorded.  Significant anasarca. ?Patient also has chronic thrombocytopenia/pancytopenia which is being managed  by oncology and hematology. ?

## 2021-05-30 NOTE — Assessment & Plan Note (Signed)
Initially diagnosed on April 20.  He was started on Eliquis by his cardiologist.  CHA2DS2-VASc score of 5.  Currently rate controlled ?-Continue Coreg and Eliquis ?

## 2021-05-30 NOTE — Assessment & Plan Note (Signed)
Patient has an history of colon cancer stage IIIb. ?No disease recurrence on recent CT. ?Being managed by outpatient oncology. ? ?

## 2021-05-30 NOTE — Assessment & Plan Note (Signed)
Concern of cardiorenal with decreased perfusion.  Creatinine at 2.17 this morning with baseline around 1.5. ?-Monitor renal function while he is being diuresed. ?-Keep holding Entresto ?-Avoid nephrotoxins ?

## 2021-05-30 NOTE — Assessment & Plan Note (Signed)
No chest pain, elevated troponin most likely secondary to demand ischemia with acute exacerbation of HFrEF. ?-Aspirin was discontinued when he was started on Eliquis. ?-Continue with statin ?-Continue spironolactone ?

## 2021-05-30 NOTE — Progress Notes (Signed)
?Progress Note ? ? ?Patient: [name]. WFU:[id number] DOB: 1945-06-14 DOA: 05/29/2021     1 ?DOS: the patient was seen and examined on 05/30/2021 ?  ?Brief hospital course: ?Taken from H&P. ? ?[name]. is a 76 y.o. male with medical history significant of colon CA (2015); CAD s/p stenting; stage 3a CKD; chronic systolic CHF; HTN; venous stasis; and DM presenting with SOB.  He was seen by cardiology on 4/20 and had symptoms c/w CHF as well as new-onset afib; he was offered admission and declined.  He was changed from Lasix to torsemide 40 mg BID but did not fill it.  Amlodipine was also stopped and he was started on Eliquis.  He reports that he has been weak for a long time.  He saw cardiology last week and "was still weak."  +edema.  He got new medicines but they cost too much and he so he couldn't afford them and asked for cheaper ones and just got it today and didn't have a chance to take them.  He had colon cancer but "it's gone".  He doesn't think the [place] O2 is running but he hasn't felt SOB since he has been on it in the ER.  +orthopnea.  ? ?On arrival to ED he was hemodynamically stable with tachycardia and mild tachypnea. ?He was hypoxic with no baseline oxygen use requiring up to 2 L of oxygen. ?Prior echocardiogram done in August 2021 with EF of 25 to 30%. ?Admitted for acute on chronic HFrEF and started on IV diuresis. ?  ?Labs pertinent for macrocytosis with MCV of 105-seems chronic, thrombocytopenia with platelet of 105 >>89 which also seems chronic, AKI with CKD stage IIIa with creatinine of 2.24, baseline around 1.5.  High-sensitivity troponin 140>>144, BNP markedly elevated above 4500. ? ?Repeat echocardiogram with dilated cardiomyopathy and EF of 20 to 25%. ?Also concern of cardiorenal with decreased perfusion. ?Holding home Entresto for concern of AKI-cardiology would like to restart as soon as renal function stabilizes, he might need inotropic support. ?Currently on Lasix 80 mg IV  twice daily.  Not a whole lot of diuresis,only 600 ml of urine output recorded.  Significant anasarca. ?Patient also has chronic thrombocytopenia/pancytopenia which is being managed by oncology and hematology. ? ? ?Assessment and Plan: ?* Acute on chronic systolic CHF (congestive heart failure) ([place]) ?Echocardiogram with severe dilated cardiomyopathy and EF of 20 to 25%.  Markedly elevated BNP and anasarca. ?Not much diuresis with Lasix.  Cardiology is on board. ?Might need inotropic. ?-Continue with IV Lasix 80 mg twice daily ?-Daily BMP and weight ?-Strict intake and output ?-Home Entresto is being held due to AKI ?-Continue with spironolactone ? ?Atrial fibrillation ([place]) ?Initially diagnosed on April 20.  He was started on Eliquis by his cardiologist.  CHA2DS2-VASc score of 5.  Currently rate controlled ?-Continue Coreg and Eliquis ? ?Coronary artery disease involving native coronary artery of native heart without angina pectoris ?No chest pain, elevated troponin most likely secondary to demand ischemia with acute exacerbation of HFrEF. ?-Aspirin was discontinued when he was started on Eliquis. ?-Continue with statin ?-Continue spironolactone ? ?Hypertension ?Blood pressure within goal. ?Holding home Entresto due to AKI. ?-Continue IV Lasix, carvedilol and spironolactone ? ?Acute renal failure superimposed on stage 3a chronic kidney disease ([place]) ?Concern of cardiorenal with decreased perfusion.  Creatinine at 2.17 this morning with baseline around 1.5. ?-Monitor renal function while he is being diuresed. ?-Keep holding Entresto ?-Avoid nephrotoxins ? ?Diabetes ([place]) ?CBG within goal.  Seems well controlled with last A1c of 6.5. ?He was on metformin at home which is being held. ?-Continue with SSI ? ?Thrombocytopenia ([place]) ?Patient seems to have chronic pancytopenia and thrombocytopenia which is being managed by hematology. ?-Continue to monitor ? ?Hyperlipidemia ?- Continue with statin ? ?History of colon  cancer ?Patient has an history of colon cancer stage IIIb. ?No disease recurrence on recent CT. ?Being managed by outpatient oncology. ? ? ? ?Subjective: Patient continued to feel little short of breath.  He was saturating on 2 L of oxygen with no baseline oxygen use.  Denies any chest pain ? ?Physical Exam: ?Vitals:  ? 05/30/21 0341 05/30/21 0500 05/30/21 0818 05/30/21 1137  ?BP: 121/87  128/85 124/82  ?Pulse: 97  (!) 40 88  ?Resp: 19   18  ?Temp: 97.6 ?F (36.4 ?C)   97.9 ?F (36.6 ?C)  ?TempSrc: Axillary   Oral  ?SpO2: 99%  99% 99%  ?Weight:  81.8 kg    ?Height:      ? ?General.  Ill-appearing elderly man, in no acute distress. ?Pulmonary.  Bilateral basal crackles, normal respiratory effort. ?CV.  Regular rate and rhythm, no JVD, rub or murmur. ?Abdomen.  Soft, nontender, nondistended, BS positive.  Anasarca with edema involving the abdominal wall. ?CNS.  Alert and oriented .  No focal neurologic deficit. ?Extremities.  2+ LE edema, no cyanosis, pulses intact and symmetrical. ?Psychiatry.  Judgment and insight appears normal. ? ?Data Reviewed: ?Prior notes, labs and images reviewed. ? ?Family Communication: Called wife with no response ? ?Disposition: ?Status is: Inpatient ?Remains inpatient appropriate because: Severity of illness ? ? Planned Discharge Destination: Home ? ?DVT prophylaxis.  Eliquis ?Time spent: 50 minutes ? ?This record has been created using Systems analyst. Errors have been sought and corrected,but may not always be located. Such creation errors do not reflect on the standard of care. ? ?Author: ?Lorella Nimrod, MD ?05/30/2021 2:29 PM ? ?For on call review [email or web address].  ?

## 2021-05-30 NOTE — Assessment & Plan Note (Addendum)
Echocardiogram with severe dilated cardiomyopathy and EF of 20 to 25%.  Markedly elevated BNP and anasarca. ?Not much diuresis with Lasix.  Cardiology is on board. ?Might need inotropic. ?-Continue with IV Lasix 80 mg twice daily ?-Daily BMP and weight ?-Strict intake and output ?-Home Entresto is being held due to AKI ?-Continue with spironolactone ?

## 2021-05-30 NOTE — Assessment & Plan Note (Signed)
CBG within goal.  Seems well controlled with last A1c of 6.5. ?He was on metformin at home which is being held. ?-Continue with SSI ?

## 2021-05-30 NOTE — Assessment & Plan Note (Signed)
-   Continue with statin ?

## 2021-05-31 DIAGNOSIS — I5023 Acute on chronic systolic (congestive) heart failure: Secondary | ICD-10-CM | POA: Diagnosis not present

## 2021-05-31 DIAGNOSIS — I4819 Other persistent atrial fibrillation: Secondary | ICD-10-CM | POA: Diagnosis not present

## 2021-05-31 LAB — GLUCOSE, CAPILLARY
Glucose-Capillary: 130 mg/dL — ABNORMAL HIGH (ref 70–99)
Glucose-Capillary: 180 mg/dL — ABNORMAL HIGH (ref 70–99)
Glucose-Capillary: 180 mg/dL — ABNORMAL HIGH (ref 70–99)
Glucose-Capillary: 195 mg/dL — ABNORMAL HIGH (ref 70–99)

## 2021-05-31 LAB — CBC
HCT: 33.9 % — ABNORMAL LOW (ref 39.0–52.0)
Hemoglobin: 11 g/dL — ABNORMAL LOW (ref 13.0–17.0)
MCH: 33.4 pg (ref 26.0–34.0)
MCHC: 32.4 g/dL (ref 30.0–36.0)
MCV: 103 fL — ABNORMAL HIGH (ref 80.0–100.0)
Platelets: 81 10*3/uL — ABNORMAL LOW (ref 150–400)
RBC: 3.29 MIL/uL — ABNORMAL LOW (ref 4.22–5.81)
RDW: 18.2 % — ABNORMAL HIGH (ref 11.5–15.5)
WBC: 4.8 10*3/uL (ref 4.0–10.5)
nRBC: 0 % (ref 0.0–0.2)

## 2021-05-31 LAB — BASIC METABOLIC PANEL
Anion gap: 5 (ref 5–15)
BUN: 54 mg/dL — ABNORMAL HIGH (ref 8–23)
CO2: 24 mmol/L (ref 22–32)
Calcium: 8.3 mg/dL — ABNORMAL LOW (ref 8.9–10.3)
Chloride: 116 mmol/L — ABNORMAL HIGH (ref 98–111)
Creatinine, Ser: 1.99 mg/dL — ABNORMAL HIGH (ref 0.61–1.24)
GFR, Estimated: 35 mL/min — ABNORMAL LOW (ref >60)
Glucose, Bld: 118 mg/dL — ABNORMAL HIGH (ref 70–99)
Potassium: 3.4 mmol/L — ABNORMAL LOW (ref 3.5–5.1)
Sodium: 145 mmol/L (ref 135–145)

## 2021-05-31 MED ORDER — POTASSIUM CHLORIDE CRYS ER 20 MEQ PO TBCR
40.0000 meq | EXTENDED_RELEASE_TABLET | Freq: Once | ORAL | Status: AC
  Administered 2021-05-31: 40 meq via ORAL
  Filled 2021-05-31: qty 2

## 2021-05-31 NOTE — Progress Notes (Signed)
PT Cancellation Note ? ?Patient Details ?Name: [name]. ?MRN: [id number] ?DOB: Sep 02, 1945 ? ? ?Cancelled Treatment:    Reason Eval/Treat Not Completed: Patient declined, no reason specified Patient's chart reviewed. Despite max encouragement patient declined PT today due to being to fatigued. Will re-attempt at a later time/date as available and patient medically appropriate for PT. Thank you! ? ? ? ?Iva Boop, PT  ?05/31/21. 2:22 PM ? ?

## 2021-05-31 NOTE — Progress Notes (Signed)
? ?Progress Note ? ?Patient Name: [name]. ?Date of Encounter: 05/31/2021 ? ?[facility] Cardiologist: Kathlyn [place], MD  ? ?Subjective  ? ?UOP -2.2L.Patient reports he is feeling better, says he wants to go home. No chest pain. Breathing improving. ? ?Inpatient Medications  ?  ?Scheduled Meds: ? apixaban  5 mg Oral BID  ? atorvastatin  40 mg Oral Daily  ? carvedilol  3.125 mg Oral BID WC  ? diphenhydrAMINE  25 mg Oral Once  ? docusate sodium  100 mg Oral BID  ? feeding supplement  237 mL Oral BID BM  ? furosemide  80 mg Intravenous BID  ? insulin aspart  0-9 Units Subcutaneous TID WC  ? multivitamin with minerals  1 tablet Oral Daily  ? potassium chloride  40 mEq Oral Daily  ? sodium chloride flush  3 mL Intravenous Q12H  ? spironolactone  25 mg Oral Daily  ? vitamin B-12  1,000 mcg Oral Daily  ? ?Continuous Infusions: ? ?PRN Meds: ?acetaminophen **OR** acetaminophen, bisacodyl, hydrALAZINE, ondansetron **OR** ondansetron (ZOFRAN) IV, polyethylene glycol, traZODone  ? ?Vital Signs  ?  ?Vitals:  ? 05/30/21 2026 05/30/21 2327 05/31/21 0435 05/31/21 0754  ?BP: 1'03/67 90/63 94/68 '$ 93/64  ?Pulse: (!) 45 87 84 82  ?Resp: '16 17 18 20  '$ ?Temp: 97.6 ?F (36.4 ?C) 97.8 ?F (36.6 ?C) 97.6 ?F (36.4 ?C) 97.8 ?F (36.6 ?C)  ?TempSrc:   Oral Oral  ?SpO2: 99% 96% 96% 95%  ?Weight:   81.2 kg   ?Height:      ? ? ?Intake/Output Summary (Last 24 hours) at 05/31/2021 0854 ?Last data filed at 05/31/2021 0400 ?Gross per 24 hour  ?Intake 1317 ml  ?Output 2250 ml  ?Net -933 ml  ? ? ?  05/31/2021  ?  4:35 AM 05/30/2021  ?  5:00 AM 05/29/2021  ?  8:45 PM  ?Last 3 Weights  ?Weight (lbs) 179 lb 0.2 oz 180 lb 6.4 oz 180 lb 3.2 oz  ?Weight (kg) 81.2 kg 81.829 kg 81.738 kg  ?   ? ?Telemetry  ?  ?Afib HR 80s, PVCs - Personally Reviewed ? ?ECG  ?  ?No new - Personally Reviewed ? ?Physical Exam  ? ?GEN: No acute distress.   ?Neck: No JVD ?Cardiac: Irreg Irreg, no murmurs, rubs, or gallops.  ?Respiratory: crackles at bases. ?GI: Soft, nontender,  non-distended  ?MS: No edema; No deformity. ?Neuro:  Nonfocal  ?Psych: Normal affect  ? ?Labs  ?  ?High Sensitivity Troponin:   ?Recent Labs  ?Lab 05/29/21 ?1552 05/29/21 ?1800  ?TROPONINIHS 140* 144*  ?   ?Chemistry ?Recent Labs  ?Lab 05/25/21 ?1513 05/29/21 ?1552 05/30/21 ?9371 05/31/21 ?6967  ?NA 145 147* 143 145  ?K 3.6 3.7 3.5 PENDING  ?CL 113* 113* 114* 116*  ?CO2 22 20* 23 24  ?GLUCOSE 183* 214* 164* 118*  ?BUN 37* 45* 48* 54*  ?CREATININE 1.90* 2.24* 2.17* 1.99*  ?CALCIUM 8.8* 9.0 8.6* 8.3*  ?MG 1.8  --  1.8  --   ?PROT 7.0 6.6  --   --   ?ALBUMIN 3.4* 3.1*  --   --   ?AST 30 55*  --   --   ?ALT 26 53*  --   --   ?ALKPHOS 113 102  --   --   ?BILITOT 2.3* 3.2*  --   --   ?GFRNONAA 37* 30* 31* 35*  ?ANIONGAP '10 14 6 5  '$ ?  ?Lipids No results for input(s):  CHOL, TRIG, HDL, LABVLDL, LDLCALC, CHOLHDL in the last 168 hours.  ?Hematology ?Recent Labs  ?Lab 05/29/21 ?1552 05/30/21 ?2355 05/31/21 ?7322  ?WBC 7.4 6.2 4.8  ?RBC 3.96* 3.68* 3.29*  ?HGB 13.2 12.2* 11.0*  ?HCT 42.3 38.8* 33.9*  ?MCV 106.8* 105.4* 103.0*  ?MCH 33.3 33.2 33.4  ?MCHC 31.2 31.4 32.4  ?RDW 19.0* 18.7* 18.2*  ?PLT 105* 89* 81*  ? ?Thyroid  ?Recent Labs  ?Lab 05/25/21 ?1513  ?TSH 3.523  ?  ?BNP ?Recent Labs  ?Lab 05/29/21 ?1552  ?BNP >4,500.0*  ?  ?DDimer No results for input(s): DDIMER in the last 168 hours.  ? ?Radiology  ?  ?DG Chest 2 View ? ?Result Date: 05/29/2021 ?CLINICAL DATA:  Shortness of breath. EXAM: CHEST - 2 VIEW COMPARISON:  04/14/2021 FINDINGS: Unchanged cardiac enlargement. Moderate to large left pleural effusion is increased in volume compared with the previous exam. Decreased aeration to the left lower lobe likely represent scratch set there is overlying opacification of the left lower lobe which may reflect atelectasis or airspace disease. No interstitial edema. Right lung clear. Visualized osseous structures are unremarkable. IMPRESSION: 1. Increase in volume of left pleural effusion. 2. Persistent opacification of the left  lower lobe which may reflect atelectasis and/or pneumonia. Electronically Signed   By: Kerby Moors M.D.   On: 05/29/2021 16:11  ? ?ECHOCARDIOGRAM COMPLETE ? ?Result Date: 05/30/2021 ?   ECHOCARDIOGRAM REPORT   Patient Name:   [name]. Date of Exam: 05/30/2021 Medical Rec #:  [id number]         Height:       69.0 in Accession #:    [id number]        Weight:       180.4 lb Date of Birth:  1945-05-13        BSA:          1.977 m? Patient Age:    76 years          BP:           121/87 mmHg Patient Gender: M                 HR:           97 bpm. Exam Location:  [facility] Procedure: 2D Echo, Cardiac Doppler and Color Doppler Indications:     CHF-acut esystolic 616.07 / P71.06  History:         Patient has prior history of Echocardiogram examinations, most                  recent 09/09/2019. CAD, Pulmonary HTN; Risk Factors:Hypertension.  Sonographer:     Sherrie Sport Referring Phys:  [place] Diagnosing Phys: [place]  1. Left ventricular ejection fraction, by estimation, is 20 to 25%. The left ventricle has severely decreased function. The left ventricle demonstrates global hypokinesis. The left ventricular internal cavity size was severely dilated. Left ventricular diastolic parameters are indeterminate.  2. Right ventricular systolic function is severely reduced. The right ventricular size is severely enlarged. Mildly increased right ventricular wall thickness.  3. Left atrial size was severely dilated.  4. Right atrial size was severely dilated.  5. The pericardial effusion is circumferential.  6. The mitral valve is grossly normal. Severe mitral valve regurgitation. No evidence of mitral stenosis.  7. Tricuspid valve regurgitation is severe. Severe tricuspid stenosis.  8. The aortic valve is grossly normal. Aortic valve regurgitation is trivial. Conclusion(s)/Recommendation(s): Findings consistent with dilated  cardiomyopathy. FINDINGS  Left Ventricle: Left ventricular ejection fraction, by  estimation, is 20 to 25%. The left ventricle has severely decreased function. The left ventricle demonstrates global hypokinesis. The left ventricular internal cavity size was severely dilated. There is no left ventricular hypertrophy. Left ventricular diastolic parameters are indeterminate. Right Ventricle: The right ventricular size is severely enlarged. Mildly increased right ventricular wall thickness. Right ventricular systolic function is severely reduced. Left Atrium: Left atrial size was severely dilated. Right Atrium: Right atrial size was severely dilated. Pericardium: Trivial pericardial effusion is present. The pericardial effusion is circumferential. Mitral Valve: The mitral valve is grossly normal. Severe mitral valve regurgitation. No evidence of mitral valve stenosis. Tricuspid Valve: The tricuspid valve is grossly normal. Tricuspid valve regurgitation is severe. Severe tricuspid stenosis. Aortic Valve: The aortic valve is grossly normal. Aortic valve regurgitation is trivial. Aortic valve mean gradient measures 2.0 mmHg. Aortic valve peak gradient measures 3.0 mmHg. Aortic valve area, by VTI measures 2.68 cm?. Pulmonic Valve: The pulmonic valve was grossly normal. Pulmonic valve regurgitation is mild. Aorta: The aortic root, ascending aorta and aortic arch are all structurally normal, with no evidence of dilitation or obstruction. IAS/Shunts: No atrial level shunt detected by color flow Doppler. There is no evidence of a patent foramen ovale. No ventricular septal defect is seen or detected. There is no evidence of an atrial septal defect.  LEFT VENTRICLE PLAX 2D LVIDd:         6.10 cm LVIDs:         5.70 cm LV PW:         0.90 cm LV IVS:        1.15 cm LVOT diam:     2.20 cm LV SV:         28 LV SV Index:   14 LVOT Area:     3.80 cm?  LV Volumes (MOD) LV vol d, MOD A2C: 188.0 ml LV vol d, MOD A4C: 206.0 ml LV vol s, MOD A2C: 191.0 ml LV vol s, MOD A4C: 146.0 ml LV SV MOD A2C:     -3.0 ml LV SV MOD  A4C:     206.0 ml LV SV MOD BP:      33.6 ml RIGHT VENTRICLE RV Basal diam:  6.00 cm RV S prime:     8.27 cm/s TAPSE (M-mode): 0.9 cm LEFT ATRIUM              Index        RIGHT ATRIUM           Index L

## 2021-05-31 NOTE — Progress Notes (Signed)
OT Cancellation Note ? ?Patient Details ?Name: [name]. ?MRN: [id number] ?DOB: Jul 14, 1945 ? ? ?Cancelled Treatment:    Reason Eval/Treat Not Completed: Patient declined, no reason specified. Consult received, chart reviewed. Pt declined OT evaluation at this time despite max encouragement and education in benefits of getting out of the bed. Pt reports sitting up in the recliner for 4hrs the previous date and it was "too much." Pt unwilling to sit up in the recliner at all and continues to decline OT evaluation. Will re-attempt at later date/time as pt is willing and appropriate.  ? ?Ardeth Perfect., MPH, MS, OTR/L ?ascom [phone] ?05/31/21, 10:32 AM ? ?

## 2021-05-31 NOTE — Progress Notes (Signed)
?Progress Note ? ? ?Patient: [name]. WEX:[id number] DOB: 07-03-1945 DOA: 05/29/2021     2 ?DOS: the patient was seen and examined on 05/31/2021 ?  ?[name]. is a 76 y.o. male with medical history significant of colon CA (2015); CAD s/p stenting; stage 3a CKD; chronic systolic CHF; HTN; venous stasis; and DM presenting with SOB.  He was seen by cardiology on 4/20 and had symptoms c/w CHF as well as new-onset afib; he was offered admission and declined.  He was changed from Lasix to torsemide 40 mg BID but did not fill it.  Amlodipine was also stopped and he was started on Eliquis.  He reports that he has been weak for a long time.  He saw cardiology last week and "was still weak."  +edema.  He got new medicines but they cost too much and he so he couldn't afford them and asked for cheaper ones and just got it today and didn't have a chance to take them.  He had colon cancer but "it's gone".  He doesn't think the [place] O2 is running but he hasn't felt SOB since he has been on it in the ER.  +orthopnea.  ?  ?On arrival to ED he was hemodynamically stable with tachycardia and mild tachypnea. ?He was hypoxic with no baseline oxygen use requiring up to 2 L of oxygen. ?Prior echocardiogram done in August 2021 with EF of 25 to 30%. ?Admitted for acute on chronic HFrEF and started on IV diuresis. ?  ?Labs pertinent for macrocytosis with MCV of 105-seems chronic, thrombocytopenia with platelet of 105 >>89 which also seems chronic, AKI with CKD stage IIIa with creatinine of 2.24, baseline around 1.5.  High-sensitivity troponin 140>>144, BNP markedly elevated above 4500. ?  ?Repeat echocardiogram with dilated cardiomyopathy and EF of 20 to 25%. ?Also concern of cardiorenal with decreased perfusion. ?Holding home Entresto for concern of AKI-cardiology would like to restart as soon as renal function stabilizes, he might need inotropic support. ?Currently on Lasix 80 mg IV twice daily.  Not a whole lot of  diuresis,only 600 ml of urine output recorded.  Significant anasarca. ?Patient also has chronic thrombocytopenia/pancytopenia which is being managed by oncology and hematology. ?  ? ? ?Assessment and Plan: ?Acute on chronic systolic CHF (congestive heart failure) ([place]) ?Echocardiogram with severe dilated cardiomyopathy and EF of 20 to 25%.  Markedly elevated BNP and anasarca. ? Cardiology is on board. Continuing IV diuresis, out 2.2 liters yesterday ?-Continue with IV Lasix 80 mg twice daily ?-Daily BMP and weight ?-Strict intake and output ?-Home Entresto is being held due to AKI ?-Continue with spironolactone ?- HH PT @ d/c ? ?Atrial fibrillation ([place]) ?Initially diagnosed on April 20.  He was started on Eliquis by his cardiologist.  CHA2DS2-VASc score of 5.  Currently rate controlled ?-Continue Coreg and Eliquis ?- plan for outpatient cardioversion ? ?Coronary artery disease involving native coronary artery of native heart without angina pectoris ?No chest pain, elevated troponin most likely secondary to demand ischemia with acute exacerbation of HFrEF. ?-Aspirin was discontinued when he was started on Eliquis. ?-Continue with statin ?-Continue spironolactone ? ?Hypertension ?Blood pressure soft with diuresis ?Holding home Entresto due to AKI. ?-Continue IV Lasix, carvedilol and spironolactone ? ?Acute renal failure superimposed on stage 3a chronic kidney disease ([place]) ?Concern of cardiorenal with decreased perfusion.  Creatinine at 2.17 this morning with baseline around 1.5. Improved to 1.99 today w/ diuresis ?-Monitor renal function while he is being  diuresed. ?-Keep holding Entresto ?-Avoid nephrotoxins ? ?Diabetes ([place]) ?CBG within goal.  Seems well controlled with last A1c of 6.5. ?He was on metformin at home which is being held. ?-Continue with SSI ? ?Thrombocytopenia ([place]) ?Patient seems to have chronic pancytopenia and thrombocytopenia which is being managed by hematology. ?-Continue to  monitor ? ?Hyperlipidemia ?- Continue with statin ? ?History of colon cancer ?Patient has an history of colon cancer stage IIIb. ?No disease recurrence on recent CT. ?Being managed by outpatient oncology. ? ? ? ?Subjective: says he's feeling well, breathing much improved, no chest pain ? ?Physical Exam: ?Vitals:  ? 05/30/21 2327 05/31/21 0435 05/31/21 0754 05/31/21 1208  ?BP: '90/63 94/68 93/64 '$ 96/69  ?Pulse: 87 84 82 63  ?Resp: '17 18 20 20  '$ ?Temp: 97.8 ?F (36.6 ?C) 97.6 ?F (36.4 ?C) 97.8 ?F (36.6 ?C) 97.7 ?F (36.5 ?C)  ?TempSrc:  Oral Oral Oral  ?SpO2: 96% 96% 95% 98%  ?Weight:  81.2 kg    ?Height:      ? ?General.  Ill-appearing elderly man, in no acute distress. ?Pulmonary.  Bilateral basal crackles, normal respiratory effort. ?CV.  Regular rate and rhythm, no JVD, rub or murmur. ?Abdomen.  Soft, nontender, nondistended, BS positive.  Anasarca with edema involving the abdominal wall. ?CNS.  Alert and oriented .  No focal neurologic deficit. ?Extremities.  1+ LE edema, no cyanosis, pulses intact and symmetrical. ?Psychiatry.  Judgment and insight appears normal. ? ?Data Reviewed: ?Prior notes, labs and images reviewed. ? ?Family Communication: Called wife with no response ? ?Disposition: ?Status is: Inpatient ?Remains inpatient appropriate because: Severity of illness ? ? Planned Discharge Destination: Home ? ?DVT prophylaxis.  Eliquis ?Time spent: 30 minutes ? ? ?Author: ?Desma Maxim, MD ?05/31/2021 3:40 PM ? ?For on call review [email or web address].  ?

## 2021-05-31 NOTE — Progress Notes (Signed)
? ?  Heart Failure Nurse Navigator Note ? ?Met with patient, he was lying in bed in no acute distress.  States he is feeling better and wanting to go home ? ?Talked about how he is going to take care of himself once he goes home. Daily weights fluid restriction, etc, needs reinforcement.   ? ?He states the cost of some medications is an issue, as the torsemide he was prescribed was going to cost $600. ? ?Also talked about [facility] but they do not have the money for the Internet. ? ?Will continue to follow. ? ?Pricilla Riffle RN [facility] ?

## 2021-05-31 NOTE — TOC Progression Note (Signed)
Transition of Care (TOC) - Progression Note  ? ? ?Patient Details  ?Name: [name]. ?MRN: [id number] ?Date of Birth: 1945-02-16 ? ?Transition of Care (TOC) CM/SW Contact  ?Candie Chroman, LCSW ?Phone Number: ?05/31/2021, 11:06 AM ? ?Clinical Narrative: Notified patient that [facility] had accepted his home health referral. Weaned down to 1 L oxygen today.    ? ?Expected Discharge Plan: [place] ?Barriers to Discharge: Continued Medical Work up ? ?Expected Discharge Plan and Services ?Expected Discharge Plan: [place] ?  ?  ?Post Acute Care Choice: Durable Medical Equipment, Home Health ?Living arrangements for the past 2 months: [place] ?                ?  ?  ?  ?  ?  ?  ?  ?  ?  ?  ? ? ?Social Determinants of Health (SDOH) Interventions ?  ? ?Readmission Risk Interventions ?   ? View : No data to display.  ?  ?  ?  ? ? ?

## 2021-06-01 ENCOUNTER — Ambulatory Visit: Payer: PPO | Admitting: Nurse Practitioner

## 2021-06-01 DIAGNOSIS — E1159 Type 2 diabetes mellitus with other circulatory complications: Secondary | ICD-10-CM | POA: Diagnosis not present

## 2021-06-01 DIAGNOSIS — I2510 Atherosclerotic heart disease of native coronary artery without angina pectoris: Secondary | ICD-10-CM | POA: Diagnosis not present

## 2021-06-01 DIAGNOSIS — N179 Acute kidney failure, unspecified: Secondary | ICD-10-CM | POA: Diagnosis not present

## 2021-06-01 DIAGNOSIS — I420 Dilated cardiomyopathy: Secondary | ICD-10-CM

## 2021-06-01 DIAGNOSIS — I5023 Acute on chronic systolic (congestive) heart failure: Secondary | ICD-10-CM | POA: Diagnosis not present

## 2021-06-01 DIAGNOSIS — N1831 Chronic kidney disease, stage 3a: Secondary | ICD-10-CM

## 2021-06-01 LAB — GLUCOSE, CAPILLARY
Glucose-Capillary: 150 mg/dL — ABNORMAL HIGH (ref 70–99)
Glucose-Capillary: 170 mg/dL — ABNORMAL HIGH (ref 70–99)
Glucose-Capillary: 146 mg/dL — ABNORMAL HIGH (ref 70–99)
Glucose-Capillary: 157 mg/dL — ABNORMAL HIGH (ref 70–99)

## 2021-06-01 LAB — CBC
HCT: 36.6 % — ABNORMAL LOW (ref 39.0–52.0)
Hemoglobin: 11.7 g/dL — ABNORMAL LOW (ref 13.0–17.0)
MCH: 32.9 pg (ref 26.0–34.0)
MCHC: 32 g/dL (ref 30.0–36.0)
MCV: 102.8 fL — ABNORMAL HIGH (ref 80.0–100.0)
Platelets: 95 10*3/uL — ABNORMAL LOW (ref 150–400)
RBC: 3.56 MIL/uL — ABNORMAL LOW (ref 4.22–5.81)
RDW: 18.1 % — ABNORMAL HIGH (ref 11.5–15.5)
WBC: 4.2 10*3/uL (ref 4.0–10.5)
nRBC: 0 % (ref 0.0–0.2)

## 2021-06-01 LAB — BASIC METABOLIC PANEL
Anion gap: 5 (ref 5–15)
BUN: 54 mg/dL — ABNORMAL HIGH (ref 8–23)
CO2: 26 mmol/L (ref 22–32)
Calcium: 8.5 mg/dL — ABNORMAL LOW (ref 8.9–10.3)
Chloride: 112 mmol/L — ABNORMAL HIGH (ref 98–111)
Creatinine, Ser: 2 mg/dL — ABNORMAL HIGH (ref 0.61–1.24)
GFR, Estimated: 34 mL/min — ABNORMAL LOW (ref >60)
Glucose, Bld: 171 mg/dL — ABNORMAL HIGH (ref 70–99)
Potassium: 3.9 mmol/L (ref 3.5–5.1)
Sodium: 143 mmol/L (ref 135–145)

## 2021-06-01 LAB — MAGNESIUM: Magnesium: 1.9 mg/dL (ref 1.7–2.4)

## 2021-06-01 MED ORDER — CARVEDILOL 3.125 MG PO TABS
3.1250 mg | ORAL_TABLET | Freq: Two times a day (BID) | ORAL | Status: DC
Start: 2021-06-01 — End: 2021-06-03
  Administered 2021-06-01 – 2021-06-03 (×4): 3.125 mg via ORAL
  Filled 2021-06-01 (×5): qty 1

## 2021-06-01 NOTE — Plan of Care (Signed)
?  Problem: Education: ?Goal: Ability to demonstrate management of disease process will improve ?Outcome: Progressing ?Goal: Ability to verbalize understanding of medication therapies will improve ?Outcome: Progressing ?Goal: Individualized Educational Video(s) ?Outcome: Progressing ?  ?Problem: Education: ?Goal: Ability to verbalize understanding of medication therapies will improve ?Outcome: Progressing ?  ?Problem: Activity: ?Goal: Capacity to carry out activities will improve ?Outcome: Progressing ?  ?

## 2021-06-01 NOTE — Progress Notes (Signed)
Physical Therapy Treatment ?Patient Details ?Name: [name]. ?MRN: [id number] ?DOB: Nov 05, 1945 ?Today's Date: 06/01/2021 ? ? ?History of Present Illness Patient is a 76 year old male admitted with acute on chronic systolic CHF.  He was seen by cardiology on 4/20 and had symptoms c/w CHF as well as new-onset afib; he was offered admission and declined. Medical history of colon CA , CAD s/p stenting, stage 3a CKD, chronic systolic CHF, HTN, venous stasis, DM. ? ?  ?PT Comments  ? ? Upon entering room patient was seated in recliner with telesitter and RN present. RN reported that patient had a fall earlier today by sliding out of his chair. Patient was requesting to go back to bed and declined any ambulation. With max encouragement to allow RN to donn sheets onto bed, patient agreed to ambulate while waiting. Patient ambulated ~70 feet with RW at [facility] with mild unsteadiness noted due to BLE weakness. Patient continues to demonstrate bed mobility at [facility] with increased time to complete. Patient was left in bed with all need met and in reach. Patient would continue to benefit from skilled physical therapy in order to optimize patient's return to PLOF. Continue to recommend HHPT upon discharge from acute hospitalization.  ?   ?Recommendations for follow up therapy are one component of a multi-disciplinary discharge planning process, led by the attending physician.  Recommendations may be updated based on patient status, additional functional criteria and insurance authorization. ? ?Follow Up Recommendations ? Home health PT ?  ?  ?Assistance Recommended at Discharge Intermittent Supervision/Assistance  ?Patient can return home with the following A little help with walking and/or transfers;A little help with bathing/dressing/bathroom;Help with stairs or ramp for entrance;Assist for transportation ?  ?Equipment Recommendations ? Rolling walker (2 wheels)  ?  ?Recommendations for Other Services   ? ? ?  ?Precautions /  Restrictions Precautions ?Precautions: Fall ?Restrictions ?Weight Bearing Restrictions: No  ?  ? ?Mobility ? Bed Mobility ?Overal bed mobility: Needs Assistance ?Bed Mobility: Sit to Supine ?  ?  ?  ?Sit to supine: Supervision, HOB elevated ?  ?General bed mobility comments: increased time to complete, completed on room air, able to reposition himself in bed at SUP ?Patient Response: Cooperative ? ?Transfers ?Overall transfer level: Needs assistance ?Equipment used: Rolling walker (2 wheels) ?Transfers: Sit to/from Stand ?Sit to Stand: Min guard (for safety from fall earlier) ?  ?  ?  ?  ?  ?  ?  ? ?Ambulation/Gait ?Ambulation/Gait assistance: Min guard ?Gait Distance (Feet): 70 Feet ?Assistive device: Rolling walker (2 wheels) ?Gait Pattern/deviations: Step-through pattern, Decreased step length - right, Decreased step length - left, Decreased stride length, Narrow base of support ?Gait velocity: decreased ?  ?  ?General Gait Details: patient completed ambulation with Max encouragement, no LOB noted, however mild unsteadiness due to BLE weakness ? ? ?Stairs ?  ?  ?  ?  ?  ? ? ?Wheelchair Mobility ?  ? ?Modified Rankin (Stroke Patients Only) ?  ? ? ?  ?Balance Overall balance assessment: Needs assistance ?Sitting-balance support: Feet supported ?Sitting balance-[name]: Good ?  ?  ?Standing balance support: Bilateral upper extremity supported, Reliant on assistive device for balance ?Standing balance-[name]: Fair ?Standing balance comment: with rolling walker for support in standing ?  ?  ?  ?  ?  ?  ?  ?  ?  ?  ?  ?  ? ?  ?Cognition Arousal/Alertness: Awake/alert ?Behavior During Therapy: [facility] for  tasks assessed/performed ?Overall Cognitive Status: Within Functional Limits for tasks assessed ?  ?  ?  ?  ?  ?  ?  ?  ?  ?  ?  ?  ?  ?  ?  ?  ?General Comments: Per RN patient had a fall this AM (slide out of his chair), tele sitter present ?  ?  ? ?  ?Exercises   ? ?  ?General Comments General comments (skin  integrity, edema, etc.): HR ranged from 79-93bpm, SpO2 remained >90% on room air ?  ?  ? ?Pertinent Vitals/Pain Pain Assessment ?Pain Assessment: No/denies pain  ? ? ?Home Living Family/patient expects to be discharged to:: Private residence ?Living Arrangements: Spouse/significant other;Other (Comment) (and son, both work; wife works 3 days per week) ?Available Help at Discharge: Family ?Type of Home: House ?Home Access: Stairs to enter ?  ?Entrance Stairs-Number of Steps: 2-3 ?  ?Home Layout: One level ?Home Equipment: [name] - single point;Tub bench ?   ?  ?Prior Function    ?  ?  ?   ? ?PT Goals (current goals can now be found in the care plan section) Acute Rehab PT Goals ?Patient Stated Goal: to get better and return home ?PT Goal Formulation: With patient ?Time For Goal Achievement: 06/13/21 ?Potential to Achieve Goals: Good ?Progress towards PT goals: Progressing toward goals ? ?  ?Frequency ? ? ? Min 2X/week ? ? ? ?  ?PT Plan Current plan remains appropriate  ? ? ?Co-evaluation   ?  ?  ?  ?  ? ?  ?AM-PAC PT "6 Clicks" Mobility   ?Outcome Measure ? Help needed turning from your back to your side while in a flat bed without using bedrails?: None ?Help needed moving from lying on your back to sitting on the side of a flat bed without using bedrails?: A Little ?Help needed moving to and from a bed to a chair (including a wheelchair)?: A Little ?Help needed standing up from a chair using your arms (e.g., wheelchair or bedside chair)?: A Little ?Help needed to walk in hospital room?: A Little ?Help needed climbing 3-5 steps with a railing? : A Lot ?6 Click Score: 18 ? ?  ?End of Session Equipment Utilized During Treatment: Gait belt ?Activity Tolerance: Patient tolerated treatment well ?Patient left: in bed;with call bell/phone within reach;with bed alarm set ?Nurse Communication: Mobility status ?PT Visit Diagnosis: Muscle weakness (generalized) (M62.81);Unsteadiness on feet (R26.81) ?  ? ? ?Time: 1206-1214 ?PT  Time Calculation (min) (ACUTE ONLY): 8 min ? ?Charges:  $Gait Training: 8-22 mins          ?          ? ?Iva Boop, PT  ?06/01/21. 12:30 PM ? ? ?

## 2021-06-01 NOTE — Progress Notes (Signed)
Mobility Specialist - Progress Note ? ? ? 06/01/21 1235  ?Mobility  ?Activity Refused mobility  ? ? ?Pt in recliner upon arrival using RA. Pt refuses mobility and states "he's going home" despite max encouragement. Will attempt at another date and time. ? ?[name] ?Mobility Specialist ?06/01/21, 12:36 PM\ ? ? ? ? ?

## 2021-06-01 NOTE — Progress Notes (Signed)
?Progress Note ? ? ?Patient: [name]. ANV:[id number] DOB: 05/20/1945 DOA: 05/29/2021     3 ?DOS: the patient was seen and examined on 06/01/2021 ?  ?[name]. is a 76 y.o. male with medical history significant of colon CA (2015); CAD s/p stenting; stage 3a CKD; chronic systolic CHF; HTN; venous stasis; and DM presenting with SOB.  He was seen by cardiology on 4/20 and had symptoms c/w CHF as well as new-onset afib; he was offered admission and declined.  He was changed from Lasix to torsemide 40 mg BID but did not fill it.  Amlodipine was also stopped and he was started on Eliquis.  He reports that he has been weak for a long time.  He saw cardiology last week and "was still weak."  +edema.  He got new medicines but they cost too much and he so he couldn't afford them and asked for cheaper ones and just got it today and didn't have a chance to take them.  He had colon cancer but "it's gone".  He doesn't think the [place] O2 is running but he hasn't felt SOB since he has been on it in the ER.  +orthopnea.  ?  ?On arrival to ED he was hemodynamically stable with tachycardia and mild tachypnea. ?He was hypoxic with no baseline oxygen use requiring up to 2 L of oxygen. ?Prior echocardiogram done in August 2021 with EF of 25 to 30%. ?Admitted for acute on chronic HFrEF and started on IV diuresis. ?  ?Labs pertinent for macrocytosis with MCV of 105-seems chronic, thrombocytopenia with platelet of 105 >>89 which also seems chronic, AKI with CKD stage IIIa with creatinine of 2.24, baseline around 1.5.  High-sensitivity troponin 140>>144, BNP markedly elevated above 4500. ?  ?Repeat echocardiogram with dilated cardiomyopathy and EF of 20 to 25%. ?Also concern of cardiorenal with decreased perfusion. ?Holding home Entresto for concern of AKI-cardiology would like to restart as soon as renal function stabilizes, he might need inotropic support. ?Currently on Lasix 80 mg IV twice daily.  Not a whole lot of  diuresis,only 600 ml of urine output recorded.  Significant anasarca. ?Patient also has chronic thrombocytopenia/pancytopenia which is being managed by oncology and hematology. ?  ? ? ?Assessment and Plan: ?Acute on chronic systolic CHF (congestive heart failure) ([place]) ?Echocardiogram with severe dilated cardiomyopathy and EF of 20 to 25%.  Markedly elevated BNP and anasarca. ? Cardiology is on board. Continuing IV diuresis, out 3.7 liters yesterday. Remains volume up ?-Continue with IV Lasix 80 mg twice daily ?-Daily BMP and weight ?-Strict intake and output ?-Home Entresto is being held due to AKI and hypotension ?-Continue with spironolactone ?- HH PT @ d/c, patient declines SNF ?- this morning requesting d/c home but now agreeable to remaining inpatient ? ?Atrial fibrillation ([place]) ?Initially diagnosed on April 20.  He was started on Eliquis by his cardiologist.  CHA2DS2-VASc score of 5.  Currently rate controlled ?-Continue Coreg and Eliquis ?- plan for outpatient cardioversion ? ?Coronary artery disease involving native coronary artery of native heart without angina pectoris ?No chest pain, elevated troponin most likely secondary to demand ischemia with acute exacerbation of HFrEF. ?-Aspirin was discontinued when he was started on Eliquis. ?-Continue with statin ?-Continue spironolactone ? ?Hypertension ?Blood pressure soft with diuresis ?Holding home Entresto due to AKI. ?-Continue IV Lasix, carvedilol and spironolactone ? ?Fall ?This morning. No loc or head or other trauma ?- fall precautions ?- up with assistance ? ?Acute renal  failure superimposed on stage 3a chronic kidney disease ([place]) ?Concern of cardiorenal with decreased perfusion.  Creatinine at 2.17 this morning with baseline around 1.5. Improved to 1.99 today w/ diuresis ?-Monitor renal function while he is being diuresed. ?-Keep holding Entresto ?-Avoid nephrotoxins ? ?Diabetes ([name]) ?CBG within goal.  Seems well controlled with last A1c of  6.5. ?He was on metformin at home which is being held. ?-Continue with SSI ? ?Thrombocytopenia ([place]) ?Patient seems to have chronic pancytopenia and thrombocytopenia which is being managed by hematology. ?-Continue to monitor ? ?Hyperlipidemia ?- Continue with statin ? ?History of colon cancer ?Patient has an history of colon cancer stage IIIb. ?No disease recurrence on recent CT. ?Being managed by outpatient oncology. ? ? ? ?Subjective: breathing  ? ?Physical Exam: ?Vitals:  ? 06/01/21 0348 06/01/21 0749 06/01/21 0939 06/01/21 1141  ?BP: 95/64 107/71  (!) 83/52  ?Pulse: 82 68  70  ?Resp: 18 18    ?Temp: 98.4 ?F (36.9 ?C) 97.6 ?F (36.4 ?C)  (!) 97.4 ?F (36.3 ?C)  ?TempSrc: Oral Oral  Oral  ?SpO2: 100% 98% 94% 97%  ?Weight:      ?Height:      ? ?General.  Ill-appearing elderly man, in no acute distress. ?Pulmonary.  Bilateral basal crackles, normal respiratory effort. ?CV.  Regular rate and rhythm, no JVD, rub or murmur. ?Abdomen.  Soft, nontender, nondistended, BS positive.  Anasarca with edema involving the abdominal wall. ?CNS.  Alert and oriented .  No focal neurologic deficit. ?Extremities.  1+ LE edema, no cyanosis, pulses intact and symmetrical. ?Psychiatry.  Judgment and insight appears normal. ? ?Data Reviewed: ?Prior notes, labs and images reviewed. ? ?Family Communication: Called wife with no response ? ?Disposition: ?Status is: Inpatient ?Remains inpatient appropriate because: Severity of illness ? ? Planned Discharge Destination: Home ? ?DVT prophylaxis.  Eliquis ?Time spent: 30 minutes ? ? ?Author: ?Desma Maxim, MD ?06/01/2021 12:25 PM ? ?For on call review [email or web address]  ?

## 2021-06-01 NOTE — Progress Notes (Signed)
[name] is very anxious to discharge this morning but does not appear to be ready.  RN administered his morning meds this morning while he was eating breakfast.  He cheeked them, and staff found them in one of his drink cups.  He admitted to one of our staff that he thinks the RN is trying to kill him by giving these.  MD intervention has been requested. ?

## 2021-06-01 NOTE — Evaluation (Signed)
Occupational Therapy Evaluation ?Patient Details ?Name: [name]. ?MRN: [id number] ?DOB: 1945/07/21 ?Today's Date: 06/01/2021 ? ? ?History of Present Illness Patient is a 76 year old male admitted with acute on chronic systolic CHF.  He was seen by cardiology on 4/20 and had symptoms c/w CHF as well as new-onset afib; he was offered admission and declined. Medical history of colon CA , CAD s/p stenting, stage 3a CKD, chronic systolic CHF, HTN, venous stasis, DM.  ? ?Clinical Impression ?  ?Chart reviewed, RN cleared pt for participation in OT evaluation. Pt requires significant encouragement for mobility on this date. PTA pt was MOD I-I in all ADL. Pt presents with deficits in strength, endurance, activity tolerance all affecting optimal and safe ADL completion. Pt performs bed mobility with supervision with HOB raised, STS with MIN A, short amb transfer to bedside chair with CGA. Pt is performing mobility/ADL below PLOF, will benefit from ongoing OT to address functional deficits. Pt reports he is not open to STR discharge, wants to go home. If pt refuses STR, recommend HHOT. Pt is left in bedside chair, NAD, all needs met. OT will follow acutely.  ?   ? ?Recommendations for follow up therapy are one component of a multi-disciplinary discharge planning process, led by the attending physician.  Recommendations may be updated based on patient status, additional functional criteria and insurance authorization.  ? ?Follow Up Recommendations ? Skilled nursing-short term rehab (<3 hours/day)  ?  ?Assistance Recommended at Discharge Intermittent Supervision/Assistance  ?Patient can return home with the following A little help with walking and/or transfers;A little help with bathing/dressing/bathroom;Assistance with cooking/housework;Assist for transportation;Help with stairs or ramp for entrance ? ?  ?Functional Status Assessment ? Patient has had a recent decline in their functional status and demonstrates the  ability to make significant improvements in function in a reasonable and predictable amount of time.  ?Equipment Recommendations ? None recommended by OT;Other (comment) (pt has shower chair already)  ?  ?Recommendations for Other Services   ? ? ?  ?Precautions / Restrictions Precautions ?Precautions: Fall ?Restrictions ?Weight Bearing Restrictions: No  ? ?  ? ?Mobility Bed Mobility ?Overal bed mobility: Needs Assistance ?Bed Mobility: Supine to Sit ?  ?  ?Supine to sit: Supervision, HOB elevated ?  ?  ?  ?  ? ?Transfers ?Overall transfer level: Needs assistance ?Equipment used: Rolling walker (2 wheels) ?Transfers: Sit to/from Stand ?Sit to Stand: Min assist ?  ?  ?  ?  ?  ?  ?  ? ?  ?Balance Overall balance assessment: Needs assistance ?Sitting-balance support: Feet supported ?Sitting balance-[name]: Good ?  ?  ?Standing balance support: Bilateral upper extremity supported, Reliant on assistive device for balance ?Standing balance-[name]: Fair ?  ?  ?  ?  ?  ?  ?  ?  ?  ?  ?  ?  ?   ? ?ADL either performed or assessed with clinical judgement  ? ?ADL Overall ADL's : Needs assistance/impaired ?  ?  ?Grooming: Wash/dry hands;Wash/dry face;Sitting;Set up ?  ?  ?  ?  ?  ?Upper Body Dressing : Minimal assistance;Sitting ?Upper Body Dressing Details (indicate cue type and reason): gown ?Lower Body Dressing: Maximal assistance;Bed level ?Lower Body Dressing Details (indicate cue type and reason): socks ?Toilet Transfer: Supervision/safety;Min guard ?Toilet Transfer Details (indicate cue type and reason): simulated short ambulatory tranfer to bedside chair with RW ?  ?  ?  ?  ?Functional mobility during ADLs: Supervision/safety;Minimal  assistance;Rolling walker (2 wheels) ?   ? ? ? ?Vision Patient Visual Report: No change from baseline ?   ?   ?Perception   ?  ?Praxis   ?  ? ?Pertinent Vitals/Pain Pain Assessment ?Pain Assessment: No/denies pain  ? ? ? ?Hand Dominance   ?  ?Extremity/Trunk Assessment Upper  Extremity Assessment ?Upper Extremity Assessment: Overall WFL for tasks assessed ?  ?Lower Extremity Assessment ?Lower Extremity Assessment: Generalized weakness ?  ?Cervical / Trunk Assessment ?Cervical / Trunk Assessment: Normal ?  ?Communication Communication ?Communication: No difficulties ?  ?Cognition Arousal/Alertness: Awake/alert ?Behavior During Therapy: [facility] for tasks assessed/performed ?Overall Cognitive Status: Within Functional Limits for tasks assessed ?  ?  ?  ?  ?  ?  ?  ?  ?  ?  ?  ?  ?  ?  ?  ?  ?General Comments: alert and oriented x4, encouragement to participate in therapeutic tasks ?  ?  ?General Comments  Pt with noted weeping from RLE, RN aware and present following evaluation to address ? ?  ?Exercises Other Exercises ?Other Exercises: edu re: role of OT, role of rehab, discharge recommendations, home safety, falls prevention, DME use for safe ADL completion ?  ?Shoulder Instructions    ? ? ?Home Living Family/patient expects to be discharged to:: Private residence ?Living Arrangements: Spouse/significant other;Other (Comment) (and son, both work; wife works 3 days per week) ?Available Help at Discharge: Family ?Type of Home: House ?Home Access: Stairs to enter ?Entrance Stairs-Number of Steps: 2-3 ?  ?Home Layout: One level ?  ?  ?Bathroom Shower/Tub: Tub/shower unit ?  ?  ?  ?  ?Home Equipment: [name] - single point;Tub bench ?  ?  ?  ? ?  ?Prior Functioning/Environment Prior Level of Function : Independent/Modified Independent;History of Falls (last six months) ?  ?  ?  ?  ?  ?  ?Mobility Comments: pt reports mod I-I with amb ?ADLs Comments: MOD I with DME-I in all ADL; some assist with IADL as needed but generally MOD I-I ?  ? ?  ?  ?OT Problem List: Decreased strength;Decreased activity tolerance;Decreased safety awareness ?  ?   ?OT Treatment/Interventions: Self-care/ADL training;Therapeutic exercise;DME and/or AE instruction;Energy conservation;Therapeutic activities;Balance  training;Patient/family education  ?  ?OT Goals(Current goals can be found in the care plan section) Acute Rehab OT Goals ?Patient Stated Goal: go home ?OT Goal Formulation: With patient ?Time For Goal Achievement: 06/15/21 ?Potential to Achieve Goals: Fair  ?OT Frequency: Min 2X/week ?  ? ?Co-evaluation   ?  ?  ?  ?  ? ?  ?AM-PAC OT "6 Clicks" Daily Activity     ?Outcome Measure Help from another person eating meals?: None ?Help from another person taking care of personal grooming?: None ?Help from another person toileting, which includes using toliet, bedpan, or urinal?: A Little ?Help from another person bathing (including washing, rinsing, drying)?: A Little ?Help from another person to put on and taking off regular upper body clothing?: A Little ?Help from another person to put on and taking off regular lower body clothing?: A Lot ?6 Click Score: 19 ?  ?End of Session Equipment Utilized During Treatment: Gait belt;Rolling walker (2 wheels) ?Nurse Communication: Mobility status;Other (comment) (weeping from RLE) ? ?Activity Tolerance: Patient tolerated treatment well ?Patient left: in chair;with chair alarm set;with call bell/phone within reach ? ?OT Visit Diagnosis: Unsteadiness on feet (R26.81);History of falling (Z91.81)  ?              ?  Time: 0960-4540 ?OT Time Calculation (min): 17 min ?Charges:  OT General Charges ?$OT Visit: 1 Visit ?OT Evaluation ?$OT Eval Moderate Complexity: 1 Mod ? ?Shanon Payor, OTD OTR/L  ?06/01/21, 9:51 AM  ?

## 2021-06-01 NOTE — Progress Notes (Signed)
? ?Progress Note ? ?Patient Name: [name]. ?Date of Encounter: 06/01/2021 ? ?[facility] Cardiologist: Kathlyn Sacramento, MD  ? ?Subjective  ? ?Reports he feels ready to go home ?Feels that he can finish his urination at home, feeling improved this admission ?Nurses report he is not taking his medications consistently ?Still with significant leg by flank abdominal edema ? ?Inpatient Medications  ?  ?Scheduled Meds: ? apixaban  5 mg Oral BID  ? atorvastatin  40 mg Oral Daily  ? carvedilol  3.125 mg Oral BID WC  ? docusate sodium  100 mg Oral BID  ? feeding supplement  237 mL Oral BID BM  ? furosemide  80 mg Intravenous BID  ? insulin aspart  0-9 Units Subcutaneous TID WC  ? multivitamin with minerals  1 tablet Oral Daily  ? potassium chloride  40 mEq Oral Daily  ? sodium chloride flush  3 mL Intravenous Q12H  ? spironolactone  25 mg Oral Daily  ? vitamin B-12  1,000 mcg Oral Daily  ? ?Continuous Infusions: ? ?PRN Meds: ?acetaminophen **OR** acetaminophen, bisacodyl, hydrALAZINE, ondansetron **OR** ondansetron (ZOFRAN) IV, polyethylene glycol, traZODone  ? ?Vital Signs  ?  ?Vitals:  ? 06/01/21 0300 06/01/21 0348 06/01/21 0749 06/01/21 0939  ?BP:  95/64 107/71   ?Pulse:  82 68   ?Resp:  18 18   ?Temp:  98.4 ?F (36.9 ?C) 97.6 ?F (36.4 ?C)   ?TempSrc:  Oral Oral   ?SpO2:  100% 98% 94%  ?Weight: 77.2 kg     ?Height:      ? ? ?Intake/Output Summary (Last 24 hours) at 06/01/2021 1046 ?Last data filed at 06/01/2021 0940 ?Gross per 24 hour  ?Intake 830 ml  ?Output 3675 ml  ?Net -2845 ml  ? ? ?  06/01/2021  ?  3:00 AM 05/31/2021  ?  4:35 AM 05/30/2021  ?  5:00 AM  ?Last 3 Weights  ?Weight (lbs) 170 lb 3.1 oz 179 lb 0.2 oz 180 lb 6.4 oz  ?Weight (kg) 77.2 kg 81.2 kg 81.829 kg  ?   ? ?Telemetry  ?  ?Atrial fibrillation- Personally Reviewed ? ?ECG  ?  ? - Personally Reviewed ? ?Physical Exam  ? ?GEN: No acute distress.   ?Neck: Unable to estimate JVD ?Cardiac: Irregularly irregular,  no murmurs, rubs, or gallops.  ?2+ pitting  flank edema, tight abdomen, trace to 1+ pitting lower extremity edema ?Respiratory: Clear to auscultation bilaterally. ?GI: Soft, nontender, non-distended  ?MS: No edema; No deformity. ?Neuro:  Nonfocal  ?Psych: Normal affect  ? ?Labs  ?  ?High Sensitivity Troponin:   ?Recent Labs  ?Lab 05/29/21 ?1552 05/29/21 ?1800  ?TROPONINIHS 140* 144*  ?   ?Chemistry ?Recent Labs  ?Lab 05/25/21 ?1513 05/29/21 ?1552 05/30/21 ?5916 05/31/21 ?3846 06/01/21 ?6599  ?NA 145 147* 143 145 143  ?K 3.6 3.7 3.5 3.4* 3.9  ?CL 113* 113* 114* 116* 112*  ?CO2 22 20* '23 24 26  '$ ?GLUCOSE 183* 214* 164* 118* 171*  ?BUN 37* 45* 48* 54* 54*  ?CREATININE 1.90* 2.24* 2.17* 1.99* 2.00*  ?CALCIUM 8.8* 9.0 8.6* 8.3* 8.5*  ?MG 1.8  --  1.8  --  1.9  ?PROT 7.0 6.6  --   --   --   ?ALBUMIN 3.4* 3.1*  --   --   --   ?AST 30 55*  --   --   --   ?ALT 26 53*  --   --   --   ?  ALKPHOS 113 102  --   --   --   ?BILITOT 2.3* 3.2*  --   --   --   ?GFRNONAA 37* 30* 31* 35* 34*  ?ANIONGAP '10 14 6 5 5  '$ ?  ?Lipids No results for input(s): CHOL, TRIG, HDL, LABVLDL, LDLCALC, CHOLHDL in the last 168 hours.  ?Hematology ?Recent Labs  ?Lab 05/30/21 ?0438 05/31/21 ?7564 06/01/21 ?3329  ?WBC 6.2 4.8 4.2  ?RBC 3.68* 3.29* 3.56*  ?HGB 12.2* 11.0* 11.7*  ?HCT 38.8* 33.9* 36.6*  ?MCV 105.4* 103.0* 102.8*  ?MCH 33.2 33.4 32.9  ?MCHC 31.4 32.4 32.0  ?RDW 18.7* 18.2* 18.1*  ?PLT 89* 81* 95*  ? ?Thyroid  ?Recent Labs  ?Lab 05/25/21 ?1513  ?TSH 3.523  ?  ?BNP ?Recent Labs  ?Lab 05/29/21 ?1552  ?BNP >4,500.0*  ?  ?DDimer No results for input(s): DDIMER in the last 168 hours.  ? ?Radiology  ?  ?No results found. ? ?Cardiac Studies  ?Echo May 30, 2021 ?1. Left ventricular ejection fraction, by estimation, is 20 to 25%. The  ?left ventricle has severely decreased function. The left ventricle  ?demonstrates global hypokinesis. The left ventricular internal cavity size  ?was severely dilated. Left ventricular  ?diastolic parameters are indeterminate.  ? 2. Right ventricular systolic  function is severely reduced. The right  ?ventricular size is severely enlarged. Mildly increased right ventricular  ?wall thickness.  ? 3. Left atrial size was severely dilated.  ? 4. Right atrial size was severely dilated.  ? 5. The pericardial effusion is circumferential.  ? 6. The mitral valve is grossly normal. Severe mitral valve regurgitation.  ?No evidence of mitral stenosis.  ? 7. Tricuspid valve regurgitation is severe. Severe tricuspid stenosis.  ? 8. The aortic valve is grossly normal. Aortic valve regurgitation is  ?trivial.  ? ? ?Patient Profile  ?   ?76 year old male with history of CAD s/p PCI to left circumflex, hypertension, CKD 3 presenting with shortness of breath and edema.  Being seen for severe biventricular failure and persistent atrial fibrillation. ? ? ?Assessment & Plan  ?  ?Acute on chronic biventricular failure ? still significantly fluid overloaded despite several days of aggressive IV diuretic ?He has leg edema, flank edema, tight abdomen, sacral edema ?Reports he is aware of this but still would like to go home ?" I cannot get it out with the pill, I have 80 mg" ?Long discussion with him concerning need for additional diuresis. ?" I can get it out at home.  I have the pills" ?Nurses report he was not taking his medicines this morning ?--Ideally if he is willing to stay, would continue IV Lasix twice daily, likely has several days of additional diuresis given his flank edema ?-If he insists on leaving, will refer to Dr. Si Raider. ?-We will need close follow-up in clinic, may need outpatient IV Lasix ?Long discussion concerning fluid restriction and taking his medications ?-Several medications on hold including hydralazine and isosorbide given hypotension ?-Continue Coreg, Aldactone ? ?Pleural effusion ?In the setting of biventricular failure ?Could consider thoracentesis prior to discharge if he will agree ?High chance of recurrence in the setting of heart failure as  well ? ?Cardiomyopathy, dilated ?Known coronary artery disease ?Prior PCI to left circumflex 2017 ?Doubt he would stay for ischemic work-up, also options are limited in the setting of renal failure ? ?Atrial fibrillation, persistent ?Plan was for 4 weeks uninterrupted anticoagulation no significant surname with medication compliance with his Eliquis ?On carvedilol for rate  control ? ?Discussed with hospitalist service ? Total encounter time more than 50 minutes ? Greater than 50% was spent in counseling and coordination of care with the patient ? ? ?For questions or updates, please contact [employer] ?Please consult [email or web address] for contact info under  ? ?  ?   ?Signed, ?Ida Rogue, MD  ?06/01/2021, 10:46 AM    ?

## 2021-06-01 NOTE — Progress Notes (Addendum)
At about 1125, Pt was found by staff lying in the floor in front of the recliner chair.  Pt states he slid out of the chair and denies pain or injury.  VSS but with a bit of hypotension.  MD, charge RN, and family notified of events.  No further orders received for further testing.  Tele-sitter initiated. ?
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
## 2021-06-01 NOTE — Care Management Important Message (Signed)
Important Message ? ?Patient Details  ?Name: [name]. ?MRN: [id number] ?Date of Birth: 08/10/1945 ? ? ?Medicare Important Message Given:  Yes ? ? ? ? ?Dannette Barbara ?06/01/2021, 2:05 PM ?

## 2021-06-02 ENCOUNTER — Encounter: Payer: Self-pay | Admitting: Hematology and Oncology

## 2021-06-02 ENCOUNTER — Other Ambulatory Visit (HOSPITAL_COMMUNITY): Payer: Self-pay

## 2021-06-02 DIAGNOSIS — I4819 Other persistent atrial fibrillation: Secondary | ICD-10-CM | POA: Diagnosis not present

## 2021-06-02 DIAGNOSIS — I2510 Atherosclerotic heart disease of native coronary artery without angina pectoris: Secondary | ICD-10-CM | POA: Diagnosis not present

## 2021-06-02 DIAGNOSIS — N179 Acute kidney failure, unspecified: Secondary | ICD-10-CM | POA: Diagnosis not present

## 2021-06-02 DIAGNOSIS — I5023 Acute on chronic systolic (congestive) heart failure: Secondary | ICD-10-CM | POA: Diagnosis not present

## 2021-06-02 LAB — BASIC METABOLIC PANEL
Anion gap: 8 (ref 5–15)
BUN: 53 mg/dL — ABNORMAL HIGH (ref 8–23)
CO2: 27 mmol/L (ref 22–32)
Calcium: 8.5 mg/dL — ABNORMAL LOW (ref 8.9–10.3)
Chloride: 108 mmol/L (ref 98–111)
Creatinine, Ser: 2.01 mg/dL — ABNORMAL HIGH (ref 0.61–1.24)
GFR, Estimated: 34 mL/min — ABNORMAL LOW (ref >60)
Glucose, Bld: 133 mg/dL — ABNORMAL HIGH (ref 70–99)
Potassium: 3.7 mmol/L (ref 3.5–5.1)
Sodium: 143 mmol/L (ref 135–145)

## 2021-06-02 LAB — GLUCOSE, CAPILLARY
Glucose-Capillary: 132 mg/dL — ABNORMAL HIGH (ref 70–99)
Glucose-Capillary: 166 mg/dL — ABNORMAL HIGH (ref 70–99)
Glucose-Capillary: 175 mg/dL — ABNORMAL HIGH (ref 70–99)
Glucose-Capillary: 159 mg/dL — ABNORMAL HIGH (ref 70–99)

## 2021-06-02 LAB — MAGNESIUM: Magnesium: 2 mg/dL (ref 1.7–2.4)

## 2021-06-02 NOTE — TOC Benefit Eligibility Note (Signed)
Patient Advocate Encounter ? ?Insurance verification completed.   ? ?The patient is currently admitted and upon discharge could be taking Entresto 24-26 mg. ? ?The current 30 day co-pay is, $45.00.  ? ?The patient is insured through [employer]  ? ? ?Lyndel Safe, CPhT ?Pharmacy Patient Advocate Specialist ?[facility] Patient Advocate Team ?Direct Number: [phone]  Fax: [phone] ? ? ? ? ? ?  ?

## 2021-06-02 NOTE — Progress Notes (Signed)
Occupational Therapy Treatment ?Patient Details ?Name: [name]. ?MRN: [id number] ?DOB: [birth year] ?Today's Date: 06/02/2021 ? ? ?History of present illness Patient is a 76 year old male admitted with acute on chronic systolic CHF.  He was seen by cardiology on 4/20 and had symptoms c/w CHF as well as new-onset afib; he was offered admission and declined. Medical history of colon CA , CAD s/p stenting, stage 3a CKD, chronic systolic CHF, HTN, venous stasis, DM. ?  ?OT comments ? Chart reviewed, Rn cleared pt for participation on OT tx session. Tx session targeted improving safe completion of ADL tasks. Pt completes STS With CGA with RW, requires MIN A For UB dressing/bathing, MOD A for LB bathing/peri care and intermittent vcs required for safety. Pt is eager to return home, if pt does return home recommend [place]. Pt reports he will have assistance as at home. Pt is left as received, NAD, all needs met. OT will continue to follow.  ? ?Recommendations for follow up therapy are one component of a multi-disciplinary discharge planning process, led by the attending physician.  Recommendations may be updated based on patient status, additional functional criteria and insurance authorization. ?   ?Follow Up Recommendations ? Home health OT  ?  ?Assistance Recommended at Discharge Intermittent Supervision/Assistance  ?Patient can return home with the following ? A little help with walking and/or transfers;A little help with bathing/dressing/bathroom;Assistance with cooking/housework;Assist for transportation ?  ?Equipment Recommendations ? None recommended by OT  ?  ?Recommendations for Other Services   ? ?  ?Precautions / Restrictions Precautions ?Precautions: Fall ?Restrictions ?Weight Bearing Restrictions: No  ? ? ?  ? ?Mobility Bed Mobility ?  ?  ?  ?  ?  ?  ?  ?General bed mobility comments: NT pt at edge of bed at start and end of session- bed alarm on, ok with nurse ?  ? ?Transfers ?Overall transfer level:  Needs assistance ?Equipment used: Rolling walker (2 wheels) ?Transfers: Sit to/from Stand ?Sit to Stand: Min guard ?  ?  ?  ?  ?  ?  ?  ?  ?Balance Overall balance assessment: Needs assistance ?Sitting-balance support: Feet supported ?Sitting balance-[name]: Good ?  ?  ?Standing balance support: Bilateral upper extremity supported, Reliant on assistive device for balance ?Standing balance-[name]: Fair ?  ?  ?  ?  ?  ?  ?  ?  ?  ?  ?  ?  ?   ? ?ADL either performed or assessed with clinical judgement  ? ?ADL Overall ADL's : Needs assistance/impaired ?  ?  ?Grooming: Wash/dry hands;Wash/dry face;Sitting;Set up ?  ?Upper Body Bathing: Minimal assistance;Sitting ?  ?Lower Body Bathing: Moderate assistance;Sit to/from stand ?Lower Body Bathing Details (indicate cue type and reason): with vcs for safety ?Upper Body Dressing : Minimal assistance;Sitting ?Upper Body Dressing Details (indicate cue type and reason): gown ?  ?  ?  ?  ?Toileting- Clothing Manipulation and Hygiene: Moderate assistance;Sit to/from stand ?  ?  ?  ?  ?General ADL Comments: STS with CGA with RW ?  ? ?Extremity/Trunk Assessment   ?  ?  ?  ?  ?  ? ?Vision   ?  ?  ?Perception   ?  ?Praxis   ?  ? ?Cognition Arousal/Alertness: Awake/alert ?Behavior During Therapy: Impulsive, WFL for tasks assessed/performed ?Overall Cognitive Status: Within Functional Limits for tasks assessed ?  ?  ?  ?  ?  ?  ?  ?  ?  ?  ?  ?  ?  ?  ?  ?  ?  ?  ?  ?   ?  Exercises Other Exercises ?Other Exercises: edu re: role of rehab, discharge recommendations, home safety, falls prevention ? ?  ?Shoulder Instructions   ? ? ?  ?General Comments    ? ? ?Pertinent Vitals/ Pain       Pain Assessment ?Pain Assessment: No/denies pain ? ?Home Living   ?  ?  ?  ?  ?  ?  ?  ?  ?  ?  ?  ?  ?  ?  ?  ?  ?  ?  ? ?  ?Prior Functioning/Environment    ?  ?  ?  ?   ? ?Frequency ? Min 2X/week  ? ? ? ? ?  ?Progress Toward Goals ? ?OT Goals(current goals can now be found in the care plan  section) ? Progress towards OT goals: Progressing toward goals ? ?Acute Rehab OT Goals ?Patient Stated Goal: go home ?OT Goal Formulation: With patient ?Time For Goal Achievement: 06/16/21 ?Potential to Achieve Goals: Fair  ?Plan Discharge plan remains appropriate   ? ?Co-evaluation ? ? ?   ?  ?  ?  ?  ? ?  ?AM-PAC OT "6 Clicks" Daily Activity     ?Outcome Measure ? ? Help from another person eating meals?: None ?Help from another person taking care of personal grooming?: None ?Help from another person toileting, which includes using toliet, bedpan, or urinal?: A Little ?Help from another person bathing (including washing, rinsing, drying)?: A Little ?Help from another person to put on and taking off regular upper body clothing?: A Little ?Help from another person to put on and taking off regular lower body clothing?: A Little ?6 Click Score: 20 ? ?  ?End of Session Equipment Utilized During Treatment: Rolling walker (2 wheels) ? ?OT Visit Diagnosis: Unsteadiness on feet (R26.81);History of falling (Z91.81) ?  ?Activity Tolerance Patient tolerated treatment well ?  ?Patient Left in bed;with bed alarm set;with call bell/phone within reach (at edge of bed, +bed alarm, + tele sitter, RN oked) ?  ?Nurse Communication Mobility status ?  ? ?   ? ?Time: 1657-9038 ?OT Time Calculation (min): 13 min ? ?Charges: OT General Charges ?$OT Visit: 1 Visit ?OT Treatments ?$Self Care/Home Management : 8-22 mins ? ?Shanon Payor, OTD OTR/L  ?06/02/21, 12:48 PM  ?

## 2021-06-02 NOTE — Progress Notes (Signed)
Physical Therapy Treatment ?Patient Details ?Name: [name]. ?MRN: [id number] ?DOB: [birth year] ?Today's Date: 06/02/2021 ? ? ?History of Present Illness Patient is a 76 year old male admitted with acute on chronic systolic CHF.  He was seen by cardiology on 4/20 and had symptoms c/w CHF as well as new-onset afib; he was offered admission and declined. Medical history of colon CA , CAD s/p stenting, stage 3a CKD, chronic systolic CHF, HTN, venous stasis, DM. ? ?  ?PT Comments  ? ? Physical Therapy session completed this date. With max encouragement,  patient agreed to ambulate around the nurses station 1 time. No pain reported throughout session. Sit to stand with RW was completed at SBA, and ambulated with RW was completed at CGA/SBA. No LOB noted during ambulation. Patient was left sitting EOB with bed alarm on and all needs met/in reach. Patient would continue to benefit from skilled physical therapy in order to optimize patient's return to PLOF. Continue to recommend HHPT upon discharge from acute hospitalization.  ?  ?Recommendations for follow up therapy are one component of a multi-disciplinary discharge planning process, led by the attending physician.  Recommendations may be updated based on patient status, additional functional criteria and insurance authorization. ? ?Follow Up Recommendations ? Home health PT ?  ?  ?Assistance Recommended at Discharge Intermittent Supervision/Assistance  ?Patient can return home with the following A little help with walking and/or transfers;A little help with bathing/dressing/bathroom;Help with stairs or ramp for entrance;Assist for transportation ?  ?Equipment Recommendations ? Rolling walker (2 wheels)  ?  ?Recommendations for Other Services   ? ? ?  ?Precautions / Restrictions Precautions ?Precautions: Fall ?Restrictions ?Weight Bearing Restrictions: No  ?  ? ?Mobility ? Bed Mobility ?  ?  ?  ?  ?  ?  ?  ?General bed mobility comments: Patient started and ended  session sitting EOB ?Patient Response: Cooperative ? ?Transfers ?Overall transfer level: Needs assistance ?Equipment used: Rolling walker (2 wheels) ?Transfers: Sit to/from Stand ?Sit to Stand:  (SBA) ?  ?  ?  ?  ?  ?  ?  ? ?Ambulation/Gait ?Ambulation/Gait assistance: Min guard (SBA) ?Gait Distance (Feet): 200 Feet ?Assistive device: Rolling walker (2 wheels) ?Gait Pattern/deviations: Step-through pattern, Decreased step length - right, Decreased step length - left, Decreased stride length, Narrow base of support ?Gait velocity: decreased ?  ?  ?General Gait Details: no LOB noted, cueing on safe distance to stay within the walker ? ? ?Stairs ?  ?  ?  ?  ?  ? ? ?Wheelchair Mobility ?  ? ?Modified Rankin (Stroke Patients Only) ?  ? ? ?  ?Balance Overall balance assessment: Needs assistance ?Sitting-balance support: Feet supported ?Sitting balance-[name]: Good ?  ?  ?Standing balance support: Bilateral upper extremity supported, Reliant on assistive device for balance ?Standing balance-[name]: Fair ?Standing balance comment: with rolling walker for support in standing, CGA/SBA with dynamic standing balance ?  ?  ?  ?  ?  ?  ?  ?  ?  ?  ?  ?  ? ?  ?Cognition Arousal/Alertness: Awake/alert ?Behavior During Therapy: [facility] for tasks assessed/performed ?Overall Cognitive Status: Within Functional Limits for tasks assessed ?  ?  ?  ?  ?  ?  ?  ?  ?  ?  ?  ?  ?  ?  ?  ?  ?  ?  ?  ? ?  ?Exercises   ? ?  ?General Comments   ?  ?  ? ?  Pertinent Vitals/Pain Pain Assessment ?Pain Assessment: No/denies pain  ? ? ?Home Living   ?  ?  ?  ?  ?  ?  ?  ?  ?  ?   ?  ?Prior Function    ?  ?  ?   ? ?PT Goals (current goals can now be found in the care plan section) Acute Rehab PT Goals ?Patient Stated Goal: to get better and return home ?PT Goal Formulation: With patient ?Time For Goal Achievement: 06/13/21 ?Potential to Achieve Goals: Good ?Progress towards PT goals: Progressing toward goals ? ?  ?Frequency ? ? ? Min  2X/week ? ? ? ?  ?PT Plan Current plan remains appropriate  ? ? ?Co-evaluation   ?  ?  ?  ?  ? ?  ?AM-PAC PT "6 Clicks" Mobility   ?Outcome Measure ? Help needed turning from your back to your side while in a flat bed without using bedrails?: None ?Help needed moving from lying on your back to sitting on the side of a flat bed without using bedrails?: A Little ?Help needed moving to and from a bed to a chair (including a wheelchair)?: A Little ?Help needed standing up from a chair using your arms (e.g., wheelchair or bedside chair)?: A Little ?Help needed to walk in hospital room?: A Little ?Help needed climbing 3-5 steps with a railing? : A Lot ?6 Click Score: 18 ? ?  ?End of Session Equipment Utilized During Treatment: Gait belt ?Activity Tolerance: Patient tolerated treatment well ?Patient left: in bed;with call bell/phone within reach;with bed alarm set ?Nurse Communication: Mobility status ?PT Visit Diagnosis: Muscle weakness (generalized) (M62.81);Unsteadiness on feet (R26.81) ?  ? ? ?Time: 7225-7505 ?PT Time Calculation (min) (ACUTE ONLY): 11 min ? ?Charges:  $Gait Training: 8-22 mins          ?          ? ?Iva Boop, PT  ?06/02/21. 9:58 AM ? ? ?

## 2021-06-02 NOTE — Progress Notes (Signed)
? ?Progress Note ? ?Patient Name: [name]. ?Date of Encounter: 06/02/2021 ? ?[facility] Cardiologist: Kathlyn Sacramento, MD  ? ?Subjective  ? ?Feels ready to go home, "I can get rid of the rest of the fluid at home "he reports ?Denies significant shortness of breath, no coughing ?" I will take my pills at home" ? ?Inpatient Medications  ?  ?Scheduled Meds: ? apixaban  5 mg Oral BID  ? atorvastatin  40 mg Oral Daily  ? carvedilol  3.125 mg Oral BID WC  ? docusate sodium  100 mg Oral BID  ? feeding supplement  237 mL Oral BID BM  ? furosemide  80 mg Intravenous BID  ? insulin aspart  0-9 Units Subcutaneous TID WC  ? multivitamin with minerals  1 tablet Oral Daily  ? potassium chloride  40 mEq Oral Daily  ? sodium chloride flush  3 mL Intravenous Q12H  ? spironolactone  25 mg Oral Daily  ? vitamin B-12  1,000 mcg Oral Daily  ? ?Continuous Infusions: ? ?PRN Meds: ?acetaminophen **OR** acetaminophen, bisacodyl, ondansetron **OR** ondansetron (ZOFRAN) IV, polyethylene glycol, traZODone  ? ?Vital Signs  ?  ?Vitals:  ? 06/02/21 0033 06/02/21 0253 06/02/21 0630 06/02/21 0905  ?BP: 90/60 123/83 120/85 111/75  ?Pulse:  76  76  ?Resp:  '17 17 16  '$ ?Temp:  98.2 ?F (36.8 ?C) 98 ?F (36.7 ?C) 97.9 ?F (36.6 ?C)  ?TempSrc:   Oral   ?SpO2:  97% 98% 100%  ?Weight:      ?Height:      ? ? ?Intake/Output Summary (Last 24 hours) at 06/02/2021 1014 ?Last data filed at 06/02/2021 0255 ?Gross per 24 hour  ?Intake --  ?Output 2700 ml  ?Net -2700 ml  ? ? ?  06/01/2021  ?  3:00 AM 05/31/2021  ?  4:35 AM 05/30/2021  ?  5:00 AM  ?Last 3 Weights  ?Weight (lbs) 170 lb 3.1 oz 179 lb 0.2 oz 180 lb 6.4 oz  ?Weight (kg) 77.2 kg 81.2 kg 81.829 kg  ?   ? ?Telemetry  ?  ?Atrial fibrillation rate 70-80- Personally Reviewed ? ?ECG  ?  ? - Personally Reviewed ? ?Physical Exam  ? ?Constitutional:  oriented to person, place, and time. No distress.  ?HENT:  ?Head: Grossly normal ?Eyes:  no discharge. No scleral icterus.  ?Neck: No JVD, no carotid bruits   ?Cardiovascular: Irregularly irregular no murmurs appreciated ?1-2 plus pitting edema around his flanks, buttocks, sacrum ?Pulmonary/Chest: Clear to auscultation bilaterally, no wheezes or rails ?Abdominal: Soft.  no distension.  no tenderness.  ?Musculoskeletal: Normal range of motion ?Neurological:  normal muscle tone. Coordination normal. No atrophy ?Skin: Skin warm and dry ?Psychiatric: normal affect, pleasant ? ? ?Labs  ?  ?High Sensitivity Troponin:   ?Recent Labs  ?Lab 05/29/21 ?1552 05/29/21 ?1800  ?TROPONINIHS 140* 144*  ?   ?Chemistry ?Recent Labs  ?Lab 05/29/21 ?1552 05/30/21 ?8119 05/31/21 ?1478 06/01/21 ?2956 06/02/21 ?2130  ?NA 147* 143 145 143 143  ?K 3.7 3.5 3.4* 3.9 3.7  ?CL 113* 114* 116* 112* 108  ?CO2 20* '23 24 26 27  '$ ?GLUCOSE 214* 164* 118* 171* 133*  ?BUN 45* 48* 54* 54* 53*  ?CREATININE 2.24* 2.17* 1.99* 2.00* 2.01*  ?CALCIUM 9.0 8.6* 8.3* 8.5* 8.5*  ?MG  --  1.8  --  1.9 2.0  ?PROT 6.6  --   --   --   --   ?ALBUMIN 3.1*  --   --   --   --   ?  AST 55*  --   --   --   --   ?ALT 53*  --   --   --   --   ?ALKPHOS 102  --   --   --   --   ?BILITOT 3.2*  --   --   --   --   ?GFRNONAA 30* 31* 35* 34* 34*  ?ANIONGAP '14 6 5 5 8  '$ ?  ?Lipids No results for input(s): CHOL, TRIG, HDL, LABVLDL, LDLCALC, CHOLHDL in the last 168 hours.  ?Hematology ?Recent Labs  ?Lab 05/30/21 ?0438 05/31/21 ?1610 06/01/21 ?9604  ?WBC 6.2 4.8 4.2  ?RBC 3.68* 3.29* 3.56*  ?HGB 12.2* 11.0* 11.7*  ?HCT 38.8* 33.9* 36.6*  ?MCV 105.4* 103.0* 102.8*  ?MCH 33.2 33.4 32.9  ?MCHC 31.4 32.4 32.0  ?RDW 18.7* 18.2* 18.1*  ?PLT 89* 81* 95*  ? ?Thyroid  ?No results for input(s): TSH, FREET4 in the last 168 hours. ?  ?BNP ?Recent Labs  ?Lab 05/29/21 ?1552  ?BNP >4,500.0*  ?  ?DDimer No results for input(s): DDIMER in the last 168 hours.  ? ?Radiology  ?  ?No results found. ? ?Cardiac Studies  ?Echo May 30, 2021 ?1. Left ventricular ejection fraction, by estimation, is 20 to 25%. The  ?left ventricle has severely decreased function. The  left ventricle  ?demonstrates global hypokinesis. The left ventricular internal cavity size  ?was severely dilated. Left ventricular  ?diastolic parameters are indeterminate.  ? 2. Right ventricular systolic function is severely reduced. The right  ?ventricular size is severely enlarged. Mildly increased right ventricular  ?wall thickness.  ? 3. Left atrial size was severely dilated.  ? 4. Right atrial size was severely dilated.  ? 5. The pericardial effusion is circumferential.  ? 6. The mitral valve is grossly normal. Severe mitral valve regurgitation.  ?No evidence of mitral stenosis.  ? 7. Tricuspid valve regurgitation is severe. Severe tricuspid stenosis.  ? 8. The aortic valve is grossly normal. Aortic valve regurgitation is  ?trivial.  ? ? ?Patient Profile  ?   ?76 year old male with history of CAD s/p PCI to left circumflex, hypertension, CKD 3 presenting with shortness of breath and edema.  Being seen for severe biventricular failure and persistent atrial fibrillation. ? ? ?Assessment & Plan  ?  ?Acute on chronic biventricular failure ?Presenting with significant fluid overload ?On exam yesterday still had leg edema, flank edema, tight abdomen, sacral edema ?3 L out past 24 hours, edema detailed above has improved somewhat, still with flank edema ?Threatening to leave yesterday but by report had a fall and decided to stay ?" I can do it at home" ?Ideally would be best if he stays additional 24 hours for diuresis ?Renal function stable ?-Several medications on hold including hydralazine and isosorbide given hypotension ?-Continue Coreg, Aldactone ?At time of discharge will need Lasix 80 twice daily ? ?Pleural effusion ?In the setting of biventricular failure ?Could consider thoracentesis prior to discharge if he will agree ?High chance of recurrence in the setting of heart failure as well ? ?Cardiomyopathy, dilated ?Known coronary artery disease ?Prior PCI to left circumflex 2017 ?options are limited in the  setting of renal failure ?Further discussion as outpatient.  There would be significant concern for medication compliance if stent were to be placed ? ?Atrial fibrillation, persistent ?Plan was for 4 weeks uninterrupted anticoagulation on  Eliquis ?On carvedilol for rate control ?Given history of potential noncompliance with medications, may benefit from TEE prior to cardioversion ? ?  Discussed with hospitalist service ? Total encounter time more than 40 minutes ? Greater than 50% was spent in counseling and coordination of care with the patient ? ? ?For questions or updates, please contact [facility] ?Please consult [email or web address] for contact info under  ? ?  ?   ?Signed, ?Ida Rogue, MD  ?06/02/2021, 10:14 AM    ?

## 2021-06-02 NOTE — Progress Notes (Signed)
?Progress Note ? ? ?Patient: [name]. VXB:[id number] DOB: 1945-06-19 DOA: 05/29/2021     4 ?DOS: the patient was seen and examined on 06/02/2021 ?  ?[name]. is a 76 y.o. male with medical history significant of colon CA (2015); CAD s/p stenting; stage 3a CKD; chronic systolic CHF; HTN; venous stasis; and DM presenting with SOB.  He was seen by cardiology on 4/20 and had symptoms c/w CHF as well as new-onset afib; he was offered admission and declined.  He was changed from Lasix to torsemide 40 mg BID but did not fill it.  Amlodipine was also stopped and he was started on Eliquis.  He reports that he has been weak for a long time.  He saw cardiology last week and "was still weak."  +edema.  He got new medicines but they cost too much and he so he couldn't afford them and asked for cheaper ones and just got it today and didn't have a chance to take them.  He had colon cancer but "it's gone".  He doesn't think the [place] O2 is running but he hasn't felt SOB since he has been on it in the ER.  +orthopnea.  ?  ?On arrival to ED he was hemodynamically stable with tachycardia and mild tachypnea. ?He was hypoxic with no baseline oxygen use requiring up to 2 L of oxygen. ?Prior echocardiogram done in August 2021 with EF of 25 to 30%. ?Admitted for acute on chronic HFrEF and started on IV diuresis. ?  ?Labs pertinent for macrocytosis with MCV of 105-seems chronic, thrombocytopenia with platelet of 105 >>89 which also seems chronic, AKI with CKD stage IIIa with creatinine of 2.24, baseline around 1.5.  High-sensitivity troponin 140>>144, BNP markedly elevated above 4500. ?  ?Repeat echocardiogram with dilated cardiomyopathy and EF of 20 to 25%. ?Also concern of cardiorenal with decreased perfusion. ?Holding home Entresto for concern of AKI-cardiology would like to restart as soon as renal function stabilizes, he might need inotropic support. ?Currently on Lasix 80 mg IV twice daily.  Not a whole lot of  diuresis,only 600 ml of urine output recorded.  Significant anasarca. ?Patient also has chronic thrombocytopenia/pancytopenia which is being managed by oncology and hematology. ? ?Assessment and Plan: ?Acute on chronic systolic CHF (congestive heart failure) ([place]) ?Echocardiogram with severe dilated cardiomyopathy and EF of 20 to 25%.  Markedly elevated BNP and anasarca. ? Cardiology is on board. Continuing IV diuresis, out 3.2 liters yesterday. Remains volume up ?-Continue with IV Lasix 80 mg twice daily ?-Daily BMP and weight ?-Strict intake and output ?-Home Entresto is being held due to AKI and hypotension ?-Continue with spironolactone ?- HH PT @ d/c, patient declines SNF ?- insists on d/c tomorrow ? ?Atrial fibrillation ([place]) ?Initially diagnosed on April 20.  He was started on Eliquis by his cardiologist.  CHA2DS2-VASc score of 5.  Currently rate controlled ?-Continue Coreg and Eliquis ?- plan for outpatient cardioversion ? ?Coronary artery disease involving native coronary artery of native heart without angina pectoris ?No chest pain, elevated troponin most likely secondary to demand ischemia with acute exacerbation of HFrEF. ?-Aspirin was discontinued when he was started on Eliquis. ?-Continue with statin ?-Continue spironolactone ? ?Hypertension ?Blood pressure soft with diuresis ?Holding home Entresto due to AKI. ?-Continue IV Lasix, carvedilol and spironolactone ? ?Fall ?Am 4/27. No loc or head or other trauma ?- fall precautions ?- up with assistance ? ?Acute renal failure superimposed on stage 3a chronic kidney disease ([place]) ?Concern  of cardiorenal with decreased perfusion.  Baseline around 1.5, here stable around 2 ?-Monitor renal function while he is being diuresed. ?-Keep holding Entresto ?-Avoid nephrotoxins ? ?Diabetes ([place]) ?CBG within goal.  Seems well controlled with last A1c of 6.5. ?He was on metformin at home which is being held. ?-Continue with SSI ? ?Thrombocytopenia ([place]) ?Patient seems  to have chronic pancytopenia and thrombocytopenia which is being managed by hematology. ?-Continue to monitor ? ?Hyperlipidemia ?- Continue with statin ? ?History of colon cancer ?Patient has an history of colon cancer stage IIIb. ?No disease recurrence on recent CT. ?Being managed by outpatient oncology. ? ? ? ?Subjective: breathing comfortably, has good appetite ? ?Physical Exam: ?Vitals:  ? 06/02/21 0253 06/02/21 0630 06/02/21 0905 06/02/21 1239  ?BP: 123/83 120/85 111/75 108/80  ?Pulse: 76  76 71  ?Resp: '17 17 16 18  '$ ?Temp: 98.2 ?F (36.8 ?C) 98 ?F (36.7 ?C) 97.9 ?F (36.6 ?C) 97.7 ?F (36.5 ?C)  ?TempSrc:  Oral    ?SpO2: 97% 98% 100% 100%  ?Weight:      ?Height:      ? ?General.  Ill-appearing elderly man, in no acute distress. ?Pulmonary.  Bilateral basal crackles, normal respiratory effort. ?CV.  Regular rate and rhythm, no JVD, rub or murmur. ?Abdomen.  Soft, nontender, nondistended, BS positive.  Anasarca with edema involving the abdominal wall. Improving ?CNS.  Alert and oriented .  No focal neurologic deficit. ?Extremities.  1+ LE edema, no cyanosis, pulses intact and symmetrical. ?Psychiatry.  Judgment and insight appears normal. ? ?Data Reviewed: ?Prior notes, labs and images reviewed. ? ?Family Communication: Called wife with no response ? ?Disposition: ?Status is: Inpatient ?Remains inpatient appropriate because: Severity of illness ? ? Planned Discharge Destination: Home ? ?DVT prophylaxis.  Eliquis ?Time spent: 30 minutes ? ? ?Author: ?Desma Maxim, MD ?06/02/2021 1:20 PM ? ?For on call review [email or web address].  ?

## 2021-06-02 NOTE — TOC Progression Note (Signed)
Transition of Care (TOC) - Progression Note  ? ? ?Patient Details  ?Name: [name]. ?MRN: [id number] ?Date of Birth: Jun 05, 1945 ? ?Transition of Care (TOC) CM/SW Contact  ?Candie Chroman, LCSW ?Phone Number: ?06/02/2021, 2:15 PM ? ?Clinical Narrative:  Plan for discharge home tomorrow. [facility] representative is aware. Home health orders are in. Ordered RW through [facility].  ? ?Expected Discharge Plan: [place] ?Barriers to Discharge: Continued Medical Work up ? ?Expected Discharge Plan and Services ?Expected Discharge Plan: [place] ?  ?  ?Post Acute Care Choice: Durable Medical Equipment, Home Health ?Living arrangements for the past 2 months: [place] ?                ?  ?  ?  ?  ?  ?  ?  ?  ?  ?  ? ? ?Social Determinants of Health (SDOH) Interventions ?  ? ?Readmission Risk Interventions ?   ? View : No data to display.  ?  ?  ?  ? ? ?

## 2021-06-03 ENCOUNTER — Inpatient Hospital Stay: Payer: PPO

## 2021-06-03 DIAGNOSIS — I5023 Acute on chronic systolic (congestive) heart failure: Secondary | ICD-10-CM | POA: Diagnosis not present

## 2021-06-03 DIAGNOSIS — I4819 Other persistent atrial fibrillation: Secondary | ICD-10-CM | POA: Diagnosis not present

## 2021-06-03 LAB — BASIC METABOLIC PANEL
Anion gap: 8 (ref 5–15)
BUN: 53 mg/dL — ABNORMAL HIGH (ref 8–23)
CO2: 31 mmol/L (ref 22–32)
Calcium: 9 mg/dL (ref 8.9–10.3)
Chloride: 105 mmol/L (ref 98–111)
Creatinine, Ser: 1.93 mg/dL — ABNORMAL HIGH (ref 0.61–1.24)
GFR, Estimated: 36 mL/min — ABNORMAL LOW (ref >60)
Glucose, Bld: 149 mg/dL — ABNORMAL HIGH (ref 70–99)
Potassium: 3.7 mmol/L (ref 3.5–5.1)
Sodium: 144 mmol/L (ref 135–145)

## 2021-06-03 LAB — GLUCOSE, CAPILLARY
Glucose-Capillary: 181 mg/dL — ABNORMAL HIGH (ref 70–99)
Glucose-Capillary: 163 mg/dL — ABNORMAL HIGH (ref 70–99)
Glucose-Capillary: 430 mg/dL — ABNORMAL HIGH (ref 70–99)

## 2021-06-03 LAB — MAGNESIUM: Magnesium: 2 mg/dL (ref 1.7–2.4)

## 2021-06-03 MED ORDER — TORSEMIDE 60 MG PO TABS: 60.0000 mg | ORAL_TABLET | Freq: Two times a day (BID) | ORAL | 1 refills | Status: DC

## 2021-06-03 MED ORDER — TORSEMIDE 20 MG PO TABS: 60.0000 mg | ORAL_TABLET | Freq: Two times a day (BID) | ORAL | Status: DC

## 2021-06-03 MED ORDER — SPIRONOLACTONE 25 MG PO TABS: 25.0000 mg | ORAL_TABLET | Freq: Every day | ORAL | 0 refills | Status: DC

## 2021-06-03 NOTE — TOC Progression Note (Signed)
Transition of Care (TOC) - Progression Note  ? ? ?Patient Details  ?Name: [name]. ?MRN: [id number] ?Date of Birth: 1945-09-15 ? ?Transition of Care (TOC) CM/SW Contact  ?Izola Price, RN ?Phone Number: ?06/03/2021, 3:51 PM ? ?Clinical Narrative:  4/29: Patient discharging today. Spouse to pick up. HH and DME arranged via prior CM on 4/28 via [facility] and [facility].  Simmie Davies RN CM  ? ? ? ?Expected Discharge Plan: [place] ?Barriers to Discharge: Barriers Resolved ? ?Expected Discharge Plan and Services ?Expected Discharge Plan: [place] ?  ?  ?Post Acute Care Choice: Durable Medical Equipment, Home Health ?Living arrangements for the past 2 months: [place] ?Expected Discharge Date: 06/03/21               ?DME Arranged: Walker rolling ?DME Agency: [facility] ?Date DME Agency Contacted: 06/02/21 ?  ?  ?HH Arranged: RN, OT, PT, Nurse's Aide ?[place] Agency: [facility] ([place]) ?Date HH Agency Contacted: 06/02/21 (By prior CM notes) ?  ?Representative spoke with at [facility]: Floydene Flock ? ? ?Social Determinants of Health (SDOH) Interventions ?  ? ?Readmission Risk Interventions ?   ? View : No data to display.  ?  ?  ?  ? ? ?

## 2021-06-03 NOTE — Progress Notes (Signed)
Patient discharged home via wheelchair with wife. ?

## 2021-06-03 NOTE — Progress Notes (Addendum)
? ?Progress Note ? ?Patient Name: [name]. ?Date of Encounter: 06/03/2021 ? ?[facility] Cardiologist: Kathlyn Sacramento, MD  ? ?Subjective  ? ?UOP -3.8L. Pt states he is ready to go home. Some confusion on location, possibly baseline dementia. Kidney function stable. ? ?Inpatient Medications  ?  ?Scheduled Meds: ? apixaban  5 mg Oral BID  ? atorvastatin  40 mg Oral Daily  ? carvedilol  3.125 mg Oral BID WC  ? docusate sodium  100 mg Oral BID  ? feeding supplement  237 mL Oral BID BM  ? furosemide  80 mg Intravenous BID  ? insulin aspart  0-9 Units Subcutaneous TID WC  ? multivitamin with minerals  1 tablet Oral Daily  ? potassium chloride  40 mEq Oral Daily  ? sodium chloride flush  3 mL Intravenous Q12H  ? spironolactone  25 mg Oral Daily  ? vitamin B-12  1,000 mcg Oral Daily  ? ?Continuous Infusions: ? ?PRN Meds: ?acetaminophen **OR** acetaminophen, bisacodyl, ondansetron **OR** ondansetron (ZOFRAN) IV, polyethylene glycol, traZODone  ? ?Vital Signs  ?  ?Vitals:  ? 06/02/21 2332 06/03/21 0339 06/03/21 0350 06/03/21 0745  ?BP: 118/78 123/87  119/84  ?Pulse: 75 79  77  ?Resp: '16 16  18  '$ ?Temp: (!) 97.5 ?F (36.4 ?C) 98 ?F (36.7 ?C)  98.3 ?F (36.8 ?C)  ?TempSrc: Oral Oral    ?SpO2: 95% 94%  100%  ?Weight:   68.9 kg   ?Height:      ? ? ?Intake/Output Summary (Last 24 hours) at 06/03/2021 1010 ?Last data filed at 06/03/2021 0900 ?Gross per 24 hour  ?Intake 1020 ml  ?Output 4225 ml  ?Net -3205 ml  ? ? ?  06/03/2021  ?  3:50 AM 06/01/2021  ?  3:00 AM 05/31/2021  ?  4:35 AM  ?Last 3 Weights  ?Weight (lbs) 151 lb 12.8 oz 170 lb 3.1 oz 179 lb 0.2 oz  ?Weight (kg) 68.856 kg 77.2 kg 81.2 kg  ?   ? ?Telemetry  ?  ?Afib HR 70s, frequent PVCs - Personally Reviewed ? ?ECG  ?  ?NO new - Personally Reviewed ? ?Physical Exam  ? ?GEN: No acute distress.   ?Neck: + JVD ?Cardiac: Irreg Irreg, no murmurs, rubs, or gallops.  ?Respiratory: diminished at bases ?GI: Soft, nontender, non-distended  ?MS: + lower extremity edema; No  deformity. ?Neuro:  Nonfocal  ?Psych: Normal affect  ? ?Labs  ?  ?High Sensitivity Troponin:   ?Recent Labs  ?Lab 05/29/21 ?1552 05/29/21 ?1800  ?TROPONINIHS 140* 144*  ?   ?Chemistry ?Recent Labs  ?Lab 05/29/21 ?8182 05/30/21 ?9937 06/01/21 ?1696 06/02/21 ?7893 06/03/21 ?8101  ?NA 147*   < > 143 143 144  ?K 3.7   < > 3.9 3.7 3.7  ?CL 113*   < > 112* 108 105  ?CO2 20*   < > '26 27 31  '$ ?GLUCOSE 214*   < > 171* 133* 149*  ?BUN 45*   < > 54* 53* 53*  ?CREATININE 2.24*   < > 2.00* 2.01* 1.93*  ?CALCIUM 9.0   < > 8.5* 8.5* 9.0  ?MG  --    < > 1.9 2.0 2.0  ?PROT 6.6  --   --   --   --   ?ALBUMIN 3.1*  --   --   --   --   ?AST 55*  --   --   --   --   ?ALT 53*  --   --   --   --   ?  ALKPHOS 102  --   --   --   --   ?BILITOT 3.2*  --   --   --   --   ?GFRNONAA 30*   < > 34* 34* 36*  ?ANIONGAP 14   < > '5 8 8  '$ ? < > = values in this interval not displayed.  ?  ?Lipids No results for input(s): CHOL, TRIG, HDL, LABVLDL, LDLCALC, CHOLHDL in the last 168 hours.  ?Hematology ?Recent Labs  ?Lab 05/30/21 ?0438 05/31/21 ?9179 06/01/21 ?1505  ?WBC 6.2 4.8 4.2  ?RBC 3.68* 3.29* 3.56*  ?HGB 12.2* 11.0* 11.7*  ?HCT 38.8* 33.9* 36.6*  ?MCV 105.4* 103.0* 102.8*  ?MCH 33.2 33.4 32.9  ?MCHC 31.4 32.4 32.0  ?RDW 18.7* 18.2* 18.1*  ?PLT 89* 81* 95*  ? ?Thyroid No results for input(s): TSH, FREET4 in the last 168 hours.  ?BNP ?Recent Labs  ?Lab 05/29/21 ?1552  ?BNP >4,500.0*  ?  ?DDimer No results for input(s): DDIMER in the last 168 hours.  ? ?Radiology  ?  ?No results found. ? ?Cardiac Studies  ? ?Echo May 30, 2021 ?1. Left ventricular ejection fraction, by estimation, is 20 to 25%. The  ?left ventricle has severely decreased function. The left ventricle  ?demonstrates global hypokinesis. The left ventricular internal cavity size  ?was severely dilated. Left ventricular  ?diastolic parameters are indeterminate.  ? 2. Right ventricular systolic function is severely reduced. The right  ?ventricular size is severely enlarged. Mildly increased  right ventricular  ?wall thickness.  ? 3. Left atrial size was severely dilated.  ? 4. Right atrial size was severely dilated.  ? 5. The pericardial effusion is circumferential.  ? 6. The mitral valve is grossly normal. Severe mitral valve regurgitation.  ?No evidence of mitral stenosis.  ? 7. Tricuspid valve regurgitation is severe. Severe tricuspid stenosis.  ? 8. The aortic valve is grossly normal. Aortic valve regurgitation is  ?trivial.  ?  ? ?Patient Profile  ?   ?76 y.o. male with history of CAD s/p PCI to left circumflex, hypertension, CKD 3 presenting with shortness of breath and edema.  Being seen for severe biventricular failure and persistent atrial fibrillation. ? ?Assessment & Plan  ?  ?Acute on chronic biventricular failure ?Dilated cardiomyopathy EF 20-25% ?- presented with significant fluid overload ?- IV lasix '80mg'$  BID  ?- Coreg 3.'125mg'$ BID ?- spironolactone '25mg'$  daily ?- kidney function stable ?- Net -9.8L since admission ?- continue with diuresis ? ?Pleural effusion ?- in the setting of the above ?- may need thoracentesis ?- will re-check CXR ? ?CAD with remote stenting ?- no anginal symptoms reported ?- Prior PCI to Lcx in 2017 ?- No ASA with Eliquis ?- continue BB and statin therapy ?- may benefit from ischemic evaluation ? ?Persistent Afib ?- CHADSVASC of 5 ?- plan for 4 weeks of uninterrupted a/c with Eliquis prior to cardioversion ?- continue coreg for rate control ?- may need TEE as well given noncompliance ? ?For questions or updates, please contact [place] ?Please consult [email or web address] for contact info under  ? ?  ?   ?Signed, ?Shuntavia Yerby Ninfa Meeker, PA-C  ?06/03/2021, 10:10 AM    ?

## 2021-06-03 NOTE — Progress Notes (Signed)
Discharge teaching complete. Meds, diet, activity, follow up appointments reviewed and all questions answered. Prescriptions sent to pharmacy. Patient waiting on wife to come pick up patient.  ?

## 2021-06-03 NOTE — Discharge Summary (Signed)
[name]. HWT:[id number] DOB: 1945/10/13 DOA: 05/29/2021 ? ?PCP: Leone Haven, MD ? ?Admit date: 05/29/2021 ?Discharge date: 06/03/2021 ? ?Time spent: 45 minutes ? ?Recommendations for Outpatient Follow-up:  ?Heart failure clinic f/u 5/9 as scheduled ?Cardiology f/u ?PCP f/u  ?BMP 1-2 weeks ? ? ? ?Discharge Diagnoses:  ?Principal Problem: ?  Acute on chronic systolic CHF (congestive heart failure) ([place]) ?Active Problems: ?  Atrial fibrillation ([place]) ?  Coronary artery disease involving native coronary artery of native heart without angina pectoris ?  Hypertension ?  Acute renal failure superimposed on stage 3a chronic kidney disease ([place]) ?  Diabetes ([place]) ?  Thrombocytopenia ([place]) ?  Hyperlipidemia ?  History of colon cancer ? ? ?Discharge Condition: improved ? ?Diet recommendation: heart healthy ? ?Filed Weights  ? 05/31/21 0435 06/01/21 0300 06/03/21 0350  ?Weight: 81.2 kg 77.2 kg 68.9 kg  ? ? ?History of present illness:  ?From admission h and p ?[name]. is a 76 y.o. male with medical history significant of colon CA (2015); CAD s/p stenting; stage 3a CKD; chronic systolic CHF; HTN; venous stasis; and DM presenting with SOB.  He was seen by cardiology on 4/20 and had symptoms c/w CHF as well as new-onset afib; he was offered admission and declined.  He was changed from Lasix to torsemide 40 mg BID but did not fill it.  Amlodipine was also stopped and he was started on Eliquis.  He reports that he has been weak for a long time.  He saw cardiology last week and "was still weak."  +edema.  He got new medicines but they cost too much and he so he couldn't afford them and asked for cheaper ones and just got it today and didn't have a chance to take them.  He had colon cancer but "it's gone".  He doesn't think the [place] O2 is running but he hasn't felt SOB since he has been on it in the ER.  +orthopnea.  He doesn't sleep well ever. ? ?Hospital Course:  ?Patient presented with anasarca 2/2 decompensated  systolic heart failure. Treated with IV diuresis, net out close to 11 liters. No o2 requirement. Patient still with some fluid to diurese but he insists on discharge today. He has heart failure clinic f/u in 2 weeks and we advised close f/u with cardiology as well. Diuretic switched to oral torsemide, will also continue spironolactone. Antresto on hold 2/2 hypotension. Declined snf; home health orders placed. Eliquis continued for a fib. Creatinine stable around 2 from baseline of 1.5, concern for cardiorenal. Will hold home metformin at d/c given chf and now borderline gfr. Compliance with outpatient medications and f/u has been a problem in the past and we discussed this with the patient and his wife.  ? ?Procedures: ?none  ? ?Consultations: ?cardiology ? ?Discharge Exam: ?Vitals:  ? 06/03/21 0745 06/03/21 1129  ?BP: 119/84 98/61  ?Pulse: 77 78  ?Resp: 18 16  ?Temp: 98.3 ?F (36.8 ?C) 97.6 ?F (36.4 ?C)  ?SpO2: 100% 96%  ? ? ?General.  Ill-appearing elderly man, in no acute distress. ?Pulmonary.  Bilateral basal crackles, normal respiratory effort. ?CV.  Regular rate and rhythm, no JVD, rub or murmur. ?Abdomen.  Soft, nontender, nondistended, BS positive.  Anasarca with edema involving the abdominal wall. Much improved ?CNS.  Alert and oriented .  No focal neurologic deficit. ?Extremities.  1+ LE edema, no cyanosis, pulses intact and symmetrical. ?Psychiatry.  Judgment and insight appears normal. ? ?Discharge Instructions ? ? ?  Discharge Instructions   ? ? Diet - low sodium heart healthy   Complete by: As directed ?  ? Increase activity slowly   Complete by: As directed ?  ? No wound care   Complete by: As directed ?  ? No wound care   Complete by: As directed ?  ? ?  ? ?Allergies as of 06/03/2021   ? ?   Reactions  ? Shellfish Allergy Other (See Comments), Hives  ? Pt. instructed by MD to avoid seafood  ? ?  ? ?  ?Medication List  ?  ? ?STOP taking these medications   ? ?Entresto 24-26 MG ?Generic drug:  sacubitril-valsartan ?  ?HYDROcodone-acetaminophen 5-325 MG tablet ?Commonly known as: NORCO/VICODIN ?  ?ibuprofen 800 MG tablet ?Commonly known as: ADVIL ?  ?metFORMIN 500 MG tablet ?Commonly known as: GLUCOPHAGE ?  ?valACYclovir 1000 MG tablet ?Commonly known as: Valtrex ?  ? ?  ? ?TAKE these medications   ? ?apixaban 5 MG Tabs tablet ?Commonly known as: ELIQUIS ?Take 1 tablet (5 mg total) by mouth 2 (two) times daily. ?  ?atorvastatin 40 MG tablet ?Commonly known as: LIPITOR ?TAKE 1 TABLET BY MOUTH EVERY DAY ?  ?carvedilol 3.125 MG tablet ?Commonly known as: COREG ?TAKE 1 TABLET(3.125 MG) BY MOUTH TWICE DAILY ?  ?cholecalciferol 25 MCG (1000 UNIT) tablet ?Commonly known as: VITAMIN D3 ?Take 1,000 Units by mouth daily. ?  ?docusate sodium 100 MG capsule ?Commonly known as: Colace ?Take 1 capsule (100 mg total) by mouth daily as needed for mild constipation. ?  ?ferrous sulfate 325 (65 FE) MG EC tablet ?Take 1 tablet (325 mg total) by mouth 2 (two) times daily with a meal. ?  ?gabapentin 100 MG capsule ?Commonly known as: NEURONTIN ?Take 100 mg by mouth 3 (three) times daily. ?  ?Lidocaine 4 % Gel ?Apply 1 application. topically 4 (four) times daily as needed. ?  ?loratadine 10 MG tablet ?Commonly known as: CLARITIN ?Take 1 tablet (10 mg total) by mouth daily. ?  ?spironolactone 25 MG tablet ?Commonly known as: ALDACTONE ?Take 1 tablet (25 mg total) by mouth daily. ?What changed: See the new instructions. ?  ?tamsulosin 0.4 MG Caps capsule ?Commonly known as: FLOMAX ?TAKE 1 CAPSULE(0.4 MG) BY MOUTH DAILY AFTER SUPPER ?  ?Torsemide 60 MG Tabs ?Take 60 mg by mouth 2 (two) times daily. ?What changed:  ?medication strength ?how much to take ?when to take this ?  ? ?  ? ?  ?  ? ? ?  ?Durable Medical Equipment  ?(From admission, onward)  ?  ? ? ?  ? ?  Start     Ordered  ? 06/02/21 1413  DME Walker  Once       ?Question Answer Comment  ?Walker: With 5 Inch Wheels   ?Patient needs a walker to treat with the following  condition CHF exacerbation ([place])   ?  ? 06/02/21 1412  ? ?  ?  ? ?  ? ?Allergies  ?Allergen Reactions  ? Shellfish Allergy Other (See Comments) and Hives  ?  Pt. instructed by MD to avoid seafood  ? ? Follow-up Information   ? ? [facility] Follow up.   ?Why: They will follow up with you for your home health needs: Physical therapy, occupational therapy, nursing, and aide. ? ?  ?  ? ? Wellington Hampshire, MD Follow up.   ?Specialty: Cardiology ?Contact information: ?[address] ?[address] ?[place] [address] ?[phone] ? ? ?  ?  ? ?  ?  ? ?  ? ? ? ?  The results of significant diagnostics from this hospitalization (including imaging, microbiology, ancillary and laboratory) are listed below for reference.   ? ?Significant Diagnostic Studies: ?CT Abdomen Pelvis Wo Contrast ? ?Result Date: 05/11/2021 ?CLINICAL DATA:  Follow-up colon carcinoma. Previous surgery and chemotherapy. Splenic lesions noted on prior CT. EXAM: CT ABDOMEN AND PELVIS WITHOUT CONTRAST TECHNIQUE: Multidetector CT imaging of the abdomen and pelvis was performed following the standard protocol without IV contrast. RADIATION DOSE REDUCTION: This exam was performed according to the departmental dose-optimization program which includes automated exposure control, adjustment of the mA and/or kV according to patient size and/or use of iterative reconstruction technique. COMPARISON:  09/20/2020 FINDINGS: Lower chest: Moderate cardiomegaly is increased since previous study. Small to moderate bilateral pleural effusions are new, with mild atelectasis in both lung bases. Hepatobiliary: No mass visualized on this unenhanced exam. Gallbladder is unremarkable. No evidence of biliary ductal dilatation. Pancreas: No mass or inflammatory process visualized on this unenhanced exam. Spleen: Within normal limits in size. Previously seen small low-attenuation lesions are not visualized on this unenhanced exam. Adrenals/Urinary tract: No evidence of  urolithiasis or hydronephrosis. Small fluid attenuation cysts again noted in both kidneys. (No followup imaging is recommended.) Unremarkable unopacified urinary bladder. Stomach/Bowel: No evidence of obstruction, inflam

## 2021-06-05 ENCOUNTER — Telehealth: Payer: Self-pay

## 2021-06-05 NOTE — Telephone Encounter (Addendum)
Transition Care Management Unsuccessful Follow-up Telephone Call ? ?Date of discharge and from where:  [facility] 06-03-21 Dx: acute on chronic systolic CHF ? ?Attempts:  1st Attempt ? ?Reason for unsuccessful TCM follow-up call:  Left voice message ? ? ?Transition Care Management Unsuccessful Follow-up Telephone Call ? ?Date of discharge and from where:  [facility] 06-03-21 Dx: acute on chronic systolic CHF ? ?Attempts:  2nd Attempt ? ?Reason for unsuccessful TCM follow-up call:  Left voice message ? ?Transition Care Management Unsuccessful Follow-up Telephone Call ? ?Date of discharge and from where: [facility] 06-03-21 Dx: acute on chronic systolic CHF  ? ?Attempts:  3rd Attempt ? ?Reason for unsuccessful TCM follow-up call:  Left voice message ? ?  ? ?  ?  ?

## 2021-06-12 ENCOUNTER — Other Ambulatory Visit: Payer: Self-pay | Admitting: Cardiovascular Disease

## 2021-06-13 ENCOUNTER — Ambulatory Visit: Payer: PPO | Attending: Family | Admitting: Family

## 2021-06-13 ENCOUNTER — Encounter: Payer: Self-pay | Admitting: Family

## 2021-06-13 VITALS — BP 100/56 | HR 85 | Resp 14 | Ht 69.0 in | Wt 159.0 lb

## 2021-06-13 DIAGNOSIS — M25511 Pain in right shoulder: Secondary | ICD-10-CM | POA: Diagnosis not present

## 2021-06-13 DIAGNOSIS — I10 Essential (primary) hypertension: Secondary | ICD-10-CM

## 2021-06-13 DIAGNOSIS — G479 Sleep disorder, unspecified: Secondary | ICD-10-CM | POA: Insufficient documentation

## 2021-06-13 DIAGNOSIS — N1832 Chronic kidney disease, stage 3b: Secondary | ICD-10-CM | POA: Diagnosis not present

## 2021-06-13 DIAGNOSIS — I5022 Chronic systolic (congestive) heart failure: Secondary | ICD-10-CM | POA: Insufficient documentation

## 2021-06-13 DIAGNOSIS — E1122 Type 2 diabetes mellitus with diabetic chronic kidney disease: Secondary | ICD-10-CM | POA: Insufficient documentation

## 2021-06-13 DIAGNOSIS — E1140 Type 2 diabetes mellitus with diabetic neuropathy, unspecified: Secondary | ICD-10-CM | POA: Diagnosis not present

## 2021-06-13 DIAGNOSIS — I130 Hypertensive heart and chronic kidney disease with heart failure and stage 1 through stage 4 chronic kidney disease, or unspecified chronic kidney disease: Secondary | ICD-10-CM | POA: Diagnosis not present

## 2021-06-13 DIAGNOSIS — Z79899 Other long term (current) drug therapy: Secondary | ICD-10-CM | POA: Insufficient documentation

## 2021-06-13 DIAGNOSIS — I2510 Atherosclerotic heart disease of native coronary artery without angina pectoris: Secondary | ICD-10-CM | POA: Insufficient documentation

## 2021-06-13 DIAGNOSIS — N1831 Chronic kidney disease, stage 3a: Secondary | ICD-10-CM | POA: Insufficient documentation

## 2021-06-13 DIAGNOSIS — Z85038 Personal history of other malignant neoplasm of large intestine: Secondary | ICD-10-CM | POA: Insufficient documentation

## 2021-06-13 MED ORDER — ENTRESTO 24-26 MG PO TABS: 1.0000 | ORAL_TABLET | Freq: Two times a day (BID) | ORAL | 5 refills | Status: DC

## 2021-06-13 MED ORDER — ENTRESTO 24-26 MG PO TABS: 1.0000 | ORAL_TABLET | Freq: Two times a day (BID) | ORAL | 3 refills | Status: DC

## 2021-06-13 NOTE — Telephone Encounter (Signed)
Please contact pt for future appointment. Pt due for 1 month f/u. 

## 2021-06-13 NOTE — Patient Instructions (Addendum)
Begin weighing daily and call for an overnight weight gain of 3 pounds or more or a weekly weight gain of more than 5 pounds.   If you have voicemail, please make sure your mailbox is cleaned out so that we may leave a message and please make sure to listen to any voicemails.     
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
## 2021-06-16 ENCOUNTER — Inpatient Hospital Stay: Payer: PPO | Attending: Oncology

## 2021-06-16 DIAGNOSIS — E538 Deficiency of other specified B group vitamins: Secondary | ICD-10-CM | POA: Insufficient documentation

## 2021-06-16 DIAGNOSIS — Z85038 Personal history of other malignant neoplasm of large intestine: Secondary | ICD-10-CM | POA: Diagnosis not present

## 2021-06-16 MED ORDER — CYANOCOBALAMIN 1000 MCG/ML IJ SOLN
1000.0000 ug | INTRAMUSCULAR | Status: DC
  Administered 2021-06-16: 1000 ug via INTRAMUSCULAR
  Filled 2021-06-16: qty 1

## 2021-06-19 ENCOUNTER — Encounter: Payer: PPO | Attending: Physician Assistant | Admitting: Physician Assistant

## 2021-06-19 DIAGNOSIS — E11622 Type 2 diabetes mellitus with other skin ulcer: Secondary | ICD-10-CM | POA: Insufficient documentation

## 2021-06-19 DIAGNOSIS — I5042 Chronic combined systolic (congestive) and diastolic (congestive) heart failure: Secondary | ICD-10-CM | POA: Diagnosis not present

## 2021-06-19 DIAGNOSIS — I2510 Atherosclerotic heart disease of native coronary artery without angina pectoris: Secondary | ICD-10-CM | POA: Insufficient documentation

## 2021-06-19 DIAGNOSIS — E1122 Type 2 diabetes mellitus with diabetic chronic kidney disease: Secondary | ICD-10-CM | POA: Diagnosis not present

## 2021-06-19 DIAGNOSIS — I130 Hypertensive heart and chronic kidney disease with heart failure and stage 1 through stage 4 chronic kidney disease, or unspecified chronic kidney disease: Secondary | ICD-10-CM | POA: Insufficient documentation

## 2021-06-19 DIAGNOSIS — E11621 Type 2 diabetes mellitus with foot ulcer: Secondary | ICD-10-CM | POA: Diagnosis not present

## 2021-06-19 DIAGNOSIS — I872 Venous insufficiency (chronic) (peripheral): Secondary | ICD-10-CM | POA: Insufficient documentation

## 2021-06-19 DIAGNOSIS — N183 Chronic kidney disease, stage 3 unspecified: Secondary | ICD-10-CM | POA: Insufficient documentation

## 2021-06-19 DIAGNOSIS — L97822 Non-pressure chronic ulcer of other part of left lower leg with fat layer exposed: Secondary | ICD-10-CM | POA: Insufficient documentation

## 2021-06-19 DIAGNOSIS — L97228 Non-pressure chronic ulcer of left calf with other specified severity: Secondary | ICD-10-CM | POA: Diagnosis not present

## 2021-06-19 NOTE — Progress Notes (Addendum)
KARLTON, MAYA ([id number]) ?Visit Report for 06/19/2021 ?Chief Complaint Document Details ?Patient Name: [name], [name]. ?Date of Service: 06/19/2021 11:00 AM ?Medical Record Number: [id number] ?Patient Account Number: [id number] ?Date of Birth/Sex: Sep 11, 1945 (76 y.o. M) ?Treating RN: Carlene Coria ?Primary Care Provider: Tommi Rumps Other Clinician: ?Referring Provider: Tommi Rumps ?Treating Provider/Extender: Jeri Cos ?Weeks in Treatment: 12 ?Information Obtained from: Patient ?Chief Complaint ?Left leg ulcer ?Electronic Signature(s) ?Signed: 06/19/2021 11:06:44 AM By: Worthy Keeler PA-C ?Entered By: Worthy Keeler on 06/19/2021 11:06:44 ?[name], [name] ([id number]) ?-------------------------------------------------------------------------------- ?HPI Details ?Patient Name: [name], [name]. ?Date of Service: 06/19/2021 11:00 AM ?Medical Record Number: [id number] ?Patient Account Number: [id number] ?Date of Birth/Sex: 19-Feb-1945 (76 y.o. M) ?Treating RN: Carlene Coria ?Primary Care Provider: Tommi Rumps Other Clinician: ?Referring Provider: Tommi Rumps ?Treating Provider/Extender: Jeri Cos ?Weeks in Treatment: 12 ?History of Present Illness ?HPI Description: 03/27/2021 upon evaluation today patient appears to be doing well with regard to his wound. This was actually an issue that he ?has had since November 2022. He does have evidence of leg swelling and even early signs of lymphedema. Subsequently he tells me that his ?daughter has been putting some "green-cream" on this. I am not sure what that is but nonetheless he does seem to have a lot of healing ?compared to what he had previous. Fortunately there does not appear to be any signs of active infection locally nor systemically at this time which ?is great news. Honestly as dry as his leg is and even the wound bed I think that Xeroform gauze would probably be an awesome option for him to ?try to help with getting this area to heal  appropriately. ?Patient does have a history of diabetes mellitus type 2, chronic venous insufficiency, hypertension, chronic kidney disease stage III, congestive ?heart failure, and coronary artery disease. ?04/03/2021 upon evaluation today patient's legs actually appear to be doing great his left leg where the wounds are is actually measuring a bit ?smaller and the wounds themselves are definitely smaller. I am actually extremely pleased with where we stand today and I see no signs of active ?infection locally or systemically at this time which is great news. No fevers, chills, nausea, vomiting, or diarrhea. ?04/10/2021 upon evaluation today patient's wounds are actually showing signs of improvement both anterior and posterior. I think that he is making ?excellent progress and overall I see no need for any major changes here at this point. ?3/13; patient presents for follow-up. He has no issues or complaints today. He reports developing shingles to his chest with some open draining ?vesicles. He does have some scabbed over lesions. He saw his primary care provider for this issue. He was started on medication. His daughter ?has been doing the dressing changing to his left leg. He has no issues or complaints about the leg wound. ?04/24/2021 upon evaluation today patient appears to be doing well with regard to his leg ulcers. Both are showing signs of improvement with new ?epithelial growth which is great news. Overall very pleased in that regard. ?05/01/2021 upon evaluation today patient appears to be doing well with regard to his wound. He has been tolerating the dressing changes without ?complication. Fortunately I do not see any evidence of active infection locally nor systemically which is great news. No fevers, chills, nausea, ?vomiting, or diarrhea. ?05/08/2021 upon evaluation today patient's anterior leg actually appears to be doing quite well. I am very pleased in that regard. I do not see any ?signs of active  infection at this time which is great news. ?05-15-2021 upon evaluation today patient's wound on the anterior leg is still healed the posterior is looking better though not significantly smaller ?yet we just switch to collagen last week. Overall I think we definitely want to give this a little bit more time before making any major adjustments ?in my opinion. ?05-22-2021 upon evaluation today patient appears to be doing excellent in regard to the anterior leg this is still healed. In regard to the posterior ?leg is doing better it is very slow but nonetheless making progress little by little. Fortunately I do not see any evidence of active infection locally ?nor systemically at this time. ?05-29-2021 upon evaluation today patient unfortunately is having a significant issue with his breathing upon evaluation today. He presents for ?evaluation here in the clinic with a pulse ox running around the low to mid 80s on room air when he comes into the room. We were able to get ?this under better control once I got him on 2 L of oxygen he was able to get up to around 98 to 99% for the most part and his heart rate dropped ?from around the 130s down into the high 80s low 90s. Obviously this was a good improvement but nonetheless this has been going on for some ?time and getting worse I am concerned about a CHF exacerbation. ?06-19-2021 upon evaluation today patient appears to be doing well with regard to his leg in fact it appears to be completely healed at this point. ?Fortunately I do not see any evidence of active infection locally nor systemically which is great news. No fevers, chills, nausea, vomiting, or ?diarrhea. ?Electronic Signature(s) ?Signed: 06/19/2021 1:04:22 PM By: Worthy Keeler PA-C ?Entered By: Worthy Keeler on 06/19/2021 13:04:22 ?[name], [name] ([id number]) ?-------------------------------------------------------------------------------- ?Physical Exam Details ?Patient Name: [name], [name]. ?Date of Service:  06/19/2021 11:00 AM ?Medical Record Number: [id number] ?Patient Account Number: [id number] ?Date of Birth/Sex: 04-08-1945 (76 y.o. M) ?Treating RN: Carlene Coria ?Primary Care Provider: Tommi Rumps Other Clinician: ?Referring Provider: Tommi Rumps ?Treating Provider/Extender: Jeri Cos ?Weeks in Treatment: 12 ?Constitutional ?Well-nourished and well-hydrated in no acute distress. ?Respiratory ?normal breathing without difficulty. ?Psychiatric ?this patient is able to make decisions and demonstrates good insight into disease process. Alert and Oriented x 3. pleasant and cooperative. ?Notes ?Upon inspection patient's wound bed showed evidence of good granulation and epithelization in fact everything appears to be completely closed ?which is great news and overall I am extremely pleased with where we stand. ?Electronic Signature(s) ?Signed: 06/19/2021 1:04:37 PM By: Worthy Keeler PA-C ?Entered By: Worthy Keeler on 06/19/2021 13:04:37 ?[name], [name] ([id number]) ?-------------------------------------------------------------------------------- ?Physician Orders Details ?Patient Name: [name], [name]. ?Date of Service: 06/19/2021 11:00 AM ?Medical Record Number: [id number] ?Patient Account Number: [id number] ?Date of Birth/Sex: 02/24/1945 (76 y.o. M) ?Treating RN: Carlene Coria ?Primary Care Provider: Tommi Rumps Other Clinician: ?Referring Provider: Tommi Rumps ?Treating Provider/Extender: Jeri Cos ?Weeks in Treatment: 12 ?Verbal / Phone Orders: No ?Diagnosis Coding ?ICD-10 Coding ?Code Description ?E11.622 Type 2 diabetes mellitus with other skin ulcer ?I87.2 Venous insufficiency (chronic) (peripheral) ?C78.938 Non-pressure chronic ulcer of other part of left lower leg with fat layer exposed ?I10 Essential (primary) hypertension ?N18.30 Chronic kidney disease, stage 3 unspecified ?I50.42 Chronic combined systolic (congestive) and diastolic (congestive) heart failure ?I25.10 Atherosclerotic heart  disease of native coronary artery without angina pectoris ?Discharge From [facility] Services ?o Discharge from [facility] ?o Wear compression garments daily.  Put garments on first thing when you wake

## 2021-06-20 NOTE — Progress Notes (Addendum)
[name] ([id number]) Visit Report for 06/19/2021 Arrival Information Details Patient Name: [name] Date of Service: 06/19/2021 11:00 AM Medical Record Number: [id number] Patient Account Number: [id number] Date of Birth/Sex: 02-28-1945 (75 y.o. M) Treating RN: Carlene Coria Primary Care Yehuda Printup: Tommi Rumps Other Clinician: Referring Taela Charbonneau: Tommi Rumps Treating Nikie Cid/Extender: Skipper Cliche in Treatment: 12 Visit Information History Since Last Visit All ordered tests and consults were completed: No Patient Arrived: Ambulatory Added or deleted any medications: No Arrival Time: 10:54 Any new allergies or adverse reactions: No Accompanied By: self Had a fall or experienced change in No Transfer Assistance: None activities of daily living that may affect Patient Identification Verified: Yes risk of falls: Secondary Verification Process Completed: Yes Signs or symptoms of abuse/neglect since last visito No Patient Requires Transmission-Based Precautions: No Hospitalized since last visit: No Patient Has Alerts: No Implantable device outside of the clinic excluding No cellular tissue based products placed in the center since last visit: Has Dressing in Place as Prescribed: Yes Pain Present Now: No Electronic Signature(s) Signed: 06/20/2021 10:35:39 AM By: Carlene Coria RN Entered By: Carlene Coria on 06/19/2021 10:58:41 [name] ([id number]) -------------------------------------------------------------------------------- Clinic Level of Care Assessment Details Patient Name: [name] Date of Service: 06/19/2021 11:00 AM Medical Record Number: [id number] Patient Account Number: [id number] Date of Birth/Sex: 1945-11-15 (75 y.o. M) Treating RN: Carlene Coria Primary Care Syleena Mchan: Tommi Rumps Other Clinician: Referring Deandria Klute: Tommi Rumps Treating Renatha Rosen/Extender: Skipper Cliche in Treatment: 12 Clinic Level of Care  Assessment Items TOOL 4 Quantity Score X - Use when only an EandM is performed on FOLLOW-UP visit 1 0 ASSESSMENTS - Nursing Assessment / Reassessment X - Reassessment of Co-morbidities (includes updates in patient status) 1 10 X- 1 5 Reassessment of Adherence to Treatment Plan ASSESSMENTS - Wound and Skin Assessment / Reassessment X - Simple Wound Assessment / Reassessment - one wound 1 5 '[]'$  - 0 Complex Wound Assessment / Reassessment - multiple wounds '[]'$  - 0 Dermatologic / Skin Assessment (not related to wound area) ASSESSMENTS - Focused Assessment '[]'$  - Circumferential Edema Measurements - multi extremities 0 '[]'$  - 0 Nutritional Assessment / Counseling / Intervention '[]'$  - 0 Lower Extremity Assessment (monofilament, tuning fork, pulses) '[]'$  - 0 Peripheral Arterial Disease Assessment (using hand held doppler) ASSESSMENTS - Ostomy and/or Continence Assessment and Care '[]'$  - Incontinence Assessment and Management 0 '[]'$  - 0 Ostomy Care Assessment and Management (repouching, etc.) PROCESS - Coordination of Care X - Simple Patient / Family Education for ongoing care 1 15 '[]'$  - 0 Complex (extensive) Patient / Family Education for ongoing care X- 1 10 Staff obtains Programmer, systems, Records, Test Results / Process Orders '[]'$  - 0 Staff telephones HHA, Nursing Homes / Clarify orders / etc '[]'$  - 0 Routine Transfer to another Facility (non-emergent condition) '[]'$  - 0 Routine Hospital Admission (non-emergent condition) '[]'$  - 0 New Admissions / Biomedical engineer / Ordering NPWT, Apligraf, etc. '[]'$  - 0 Emergency Hospital Admission (emergent condition) X- 1 10 Simple Discharge Coordination '[]'$  - 0 Complex (extensive) Discharge Coordination PROCESS - Special Needs '[]'$  - Pediatric / Minor Patient Management 0 '[]'$  - 0 Isolation Patient Management '[]'$  - 0 Hearing / Language / Visual special needs '[]'$  - 0 Assessment of Community assistance (transportation, D/C planning, etc.) '[]'$  - 0 Additional  assistance / Altered mentation '[]'$  - 0 Support Surface(s) Assessment (bed, cushion, seat, etc.) INTERVENTIONS - Wound Cleansing / Measurement TOMA, ARTS. ([id number]) X- 1 5 Simple Wound Cleansing -  one wound '[]'$  - 0 Complex Wound Cleansing - multiple wounds X- 1 5 Wound Imaging (photographs - any number of wounds) '[]'$  - 0 Wound Tracing (instead of photographs) X- 1 5 Simple Wound Measurement - one wound '[]'$  - 0 Complex Wound Measurement - multiple wounds INTERVENTIONS - Wound Dressings '[]'$  - Small Wound Dressing one or multiple wounds 0 '[]'$  - 0 Medium Wound Dressing one or multiple wounds '[]'$  - 0 Large Wound Dressing one or multiple wounds '[]'$  - 0 Application of Medications - topical '[]'$  - 0 Application of Medications - injection INTERVENTIONS - Miscellaneous '[]'$  - External ear exam 0 '[]'$  - 0 Specimen Collection (cultures, biopsies, blood, body fluids, etc.) '[]'$  - 0 Specimen(s) / Culture(s) sent or taken to Lab for analysis '[]'$  - 0 Patient Transfer (multiple staff / Civil Service fast streamer / Similar devices) '[]'$  - 0 Simple Staple / Suture removal (25 or less) '[]'$  - 0 Complex Staple / Suture removal (26 or more) '[]'$  - 0 Hypo / Hyperglycemic Management (close monitor of Blood Glucose) '[]'$  - 0 Ankle / Brachial Index (ABI) - do not check if billed separately X- 1 5 Vital Signs Has the patient been seen at the hospital within the last three years: Yes Total Score: 75 Level Of Care: New/Established - Level 2 Electronic Signature(s) Signed: 06/20/2021 10:35:39 AM By: Carlene Coria RN Entered By: Carlene Coria on 06/19/2021 11:30:56 [name] ([id number]) -------------------------------------------------------------------------------- Encounter Discharge Information Details Patient Name: [name] Date of Service: 06/19/2021 11:00 AM Medical Record Number: [id number] Patient Account Number: [id number] Date of Birth/Sex: January 14, 1946 (75 y.o. M) Treating RN: Carlene Coria Primary Care  Shaakira Borrero: Tommi Rumps Other Clinician: Referring Kosei Rhodes: Tommi Rumps Treating Tamecka Milham/Extender: Skipper Cliche in Treatment: 12 Encounter Discharge Information Items Discharge Condition: Stable Ambulatory Status: Cane Discharge Destination: Home Transportation: Private Auto Accompanied By: self Schedule Follow-up Appointment: Yes Clinical Summary of Care: Patient Declined Electronic Signature(s) Signed: 06/20/2021 10:35:39 AM By: Carlene Coria RN Entered By: Carlene Coria on 06/19/2021 11:35:57 [name] ([id number]) -------------------------------------------------------------------------------- Lower Extremity Assessment Details Patient Name: [name] Date of Service: 06/19/2021 11:00 AM Medical Record Number: [id number] Patient Account Number: [id number] Date of Birth/Sex: November 20, 1945 (75 y.o. M) Treating RN: Carlene Coria Primary Care Antaniya Venuti: Tommi Rumps Other Clinician: Referring Kelechi Orgeron: Tommi Rumps Treating Phylis Javed/Extender: Skipper Cliche in Treatment: 12 Edema Assessment Assessed: [Left: No] [Right: No] Edema: [Left: N] [Right: o] Calf Left: Right: Point of Measurement: 35 cm From Medial Instep 32 cm Ankle Left: Right: Point of Measurement: 10 cm From Medial Instep 22 cm Vascular Assessment Pulses: Dorsalis Pedis Palpable: [Left:Yes] Electronic Signature(s) Signed: 06/20/2021 10:35:39 AM By: Carlene Coria RN Entered By: Carlene Coria on 06/19/2021 11:04:47 [name] ([id number]) -------------------------------------------------------------------------------- Multi Wound Chart Details Patient Name: [name] Date of Service: 06/19/2021 11:00 AM Medical Record Number: [id number] Patient Account Number: [id number] Date of Birth/Sex: Jun 25, 1945 (76 y.o. M) Treating RN: Carlene Coria Primary Care Quantina Dershem: Tommi Rumps Other Clinician: Referring Jaymon Dudek: Tommi Rumps Treating Rhyse Skowron/Extender: Skipper Cliche in Treatment: 12 Vital Signs Height(in): Pulse(bpm): 98 Weight(lbs): 183 Blood Pressure(mmHg): 114/75 Body Mass Index(BMI): Temperature(F): 97.7 Respiratory Rate(breaths/min): 18 Photos: [N/A:N/A] Wound Location: Left, Posterior Lower Leg N/A N/A Wounding Event: Gradually Appeared N/A N/A Primary Etiology: Diabetic Wound/Ulcer of the Lower N/A N/A Extremity Comorbid History: Coronary Artery Disease, N/A N/A Hypertension, Type II Diabetes Date Acquired: 12/28/2020 N/A N/A Weeks of Treatment: 12 N/A N/A Wound Status: Open N/A N/A Wound Recurrence: No N/A N/A Measurements  L x W x D (cm) 0x0x0 N/A N/A Area (cm) : 0 N/A N/A Volume (cm) : 0 N/A N/A % Reduction in Area: 100.00% N/A N/A % Reduction in Volume: 100.00% N/A N/A Classification: Grade 2 N/A N/A Exudate Amount: None Present N/A N/A Granulation Amount: None Present (0%) N/A N/A Necrotic Amount: None Present (0%) N/A N/A Exposed Structures: Fascia: No N/A N/A Fat Layer (Subcutaneous Tissue): No Tendon: No Muscle: No Joint: No Bone: No Epithelialization: Large (67-100%) N/A N/A Treatment Notes Electronic Signature(s) Signed: 06/20/2021 10:35:39 AM By: Carlene Coria RN Entered By: Carlene Coria on 06/19/2021 11:04:57 [name] ([id number]) -------------------------------------------------------------------------------- [place] Details Patient Name: [name] Date of Service: 06/19/2021 11:00 AM Medical Record Number: [id number] Patient Account Number: [id number] Date of Birth/Sex: 15-Mar-1945 (75 y.o. M) Treating RN: Carlene Coria Primary Care Jaiceon Collister: Tommi Rumps Other Clinician: Referring Terron Merfeld: Tommi Rumps Treating Melinda Pottinger/Extender: Skipper Cliche in Treatment: 12 Active Inactive Electronic Signature(s) Signed: 07/04/2021 9:40:46 AM By: Gretta Cool, BSN, RN, CWS, Kim RN, BSN Signed: 10/23/2021 2:34:05 PM By: Carlene Coria RN Previous Signature: 06/20/2021  10:35:39 AM Version By: Carlene Coria RN Entered By: Gretta Cool BSN, RN, CWS, Kim on 07/04/2021 09:40:46 [name] ([id number]) -------------------------------------------------------------------------------- Pain Assessment Details Patient Name: [name] Date of Service: 06/19/2021 11:00 AM Medical Record Number: [id number] Patient Account Number: [id number] Date of Birth/Sex: 1946-01-22 (76 y.o. M) Treating RN: Carlene Coria Primary Care Willoughby Doell: Tommi Rumps Other Clinician: Referring Kaiyla Stahly: Tommi Rumps Treating Shemika Robbs/Extender: Skipper Cliche in Treatment: 12 Active Problems Location of Pain Severity and Description of Pain Patient Has Paino No Site Locations Pain Management and Medication Current Pain Management: Electronic Signature(s) Signed: 06/20/2021 10:35:39 AM By: Carlene Coria RN Entered By: Carlene Coria on 06/19/2021 10:59:03 [name] ([id number]) -------------------------------------------------------------------------------- Patient/Caregiver Education Details Patient Name: [name] Date of Service: 06/19/2021 11:00 AM Medical Record Number: [id number] Patient Account Number: [id number] Date of Birth/Gender: 1945-02-14 (75 y.o. M) Treating RN: Carlene Coria Primary Care Physician: Tommi Rumps Other Clinician: Referring Physician: Tommi Rumps Treating Physician/Extender: Skipper Cliche in Treatment: 12 Education Assessment Education Provided To: Patient Education Topics Provided Wound/Skin Impairment: Methods: Explain/Verbal Responses: State content correctly Electronic Signature(s) Signed: 06/20/2021 10:35:39 AM By: Carlene Coria RN Entered By: Carlene Coria on 06/19/2021 11:35:29 [name] ([id number]) -------------------------------------------------------------------------------- Wound Assessment Details Patient Name: [name] Date of Service: 06/19/2021 11:00 AM Medical Record Number:  [id number] Patient Account Number: [id number] Date of Birth/Sex: [birth year] (76 y.o. M) Treating RN: Carlene Coria Primary Care Shalaya Swailes: Tommi Rumps Other Clinician: Referring Durant Scibilia: Tommi Rumps Treating Inita Uram/Extender: Skipper Cliche in Treatment: 12 Wound Status Wound Number: 2 Primary Etiology: Diabetic Wound/Ulcer of the Lower Extremity Wound Location: Left, Posterior Lower Leg Wound Status: Open Wounding Event: Gradually Appeared Comorbid Coronary Artery Disease, Hypertension, Type II History: Diabetes Date Acquired: 12/28/2020 Weeks Of Treatment: 12 Clustered Wound: No Photos Wound Measurements Length: (cm) 0 Width: (cm) 0 Depth: (cm) 0 Area: (cm) 0 Volume: (cm) 0 % Reduction in Area: 100% % Reduction in Volume: 100% Epithelialization: Large (67-100%) Tunneling: No Undermining: No Wound Description Classification: Grade 2 Exudate Amount: None Present Foul Odor After Cleansing: No Slough/Fibrino No Wound Bed Granulation Amount: None Present (0%) Exposed Structure Necrotic Amount: None Present (0%) Fascia Exposed: No Fat Layer (Subcutaneous Tissue) Exposed: No Tendon Exposed: No Muscle Exposed: No Joint Exposed: No Bone Exposed: No Electronic Signature(s) Signed: 06/20/2021 10:35:39 AM By: Carlene Coria RN Entered By: Carlene Coria on 06/19/2021 11:01:47  VUONG, MUSA ([id number]) -------------------------------------------------------------------------------- Vitals Details Patient Name: [name], [name] Date of Service: 06/19/2021 11:00 AM Medical Record Number: [id number] Patient Account Number: [id number] Date of Birth/Sex: [birth year] (75 y.o. M) Treating RN: Carlene Coria Primary Care Takera Rayl: Tommi Rumps Other Clinician: Referring Berklie Dethlefs: Tommi Rumps Treating Prentice Sackrider/Extender: Skipper Cliche in Treatment: 12 Vital Signs Time Taken: 10:58 Temperature (F): 97.7 Weight (lbs): 183 Pulse (bpm): 98 Respiratory Rate  (breaths/min): 18 Blood Pressure (mmHg): 114/75 Reference Range: 80 - 120 mg / dl Electronic Signature(s) Signed: 06/20/2021 10:35:39 AM By: Carlene Coria RN Entered By: Carlene Coria on 06/19/2021 10:58:58

## 2021-06-22 ENCOUNTER — Other Ambulatory Visit (HOSPITAL_COMMUNITY): Payer: Self-pay

## 2021-06-22 ENCOUNTER — Other Ambulatory Visit: Payer: Self-pay | Admitting: Cardiovascular Disease

## 2021-06-22 NOTE — Progress Notes (Signed)
Had a home visit scheduled and they were not home.  Waited 10 minutes, left folder.  Will reschedule next week.   [place] [phone]

## 2021-06-23 NOTE — Telephone Encounter (Signed)
Please reschedule 1 week F/U appointment. Patient canceled due to being hospitalized. Thank you!

## 2021-06-26 ENCOUNTER — Other Ambulatory Visit: Payer: Self-pay

## 2021-06-26 MED ORDER — CARVEDILOL 3.125 MG PO TABS: 3.1250 mg | ORAL_TABLET | Freq: Two times a day (BID) | ORAL | 3 refills | Status: DC

## 2021-06-26 MED ORDER — ATORVASTATIN CALCIUM 40 MG PO TABS: 40.0000 mg | ORAL_TABLET | Freq: Every day | ORAL | 3 refills | Status: DC

## 2021-06-26 NOTE — Telephone Encounter (Signed)
Scheduled

## 2021-06-26 NOTE — Telephone Encounter (Signed)
Please contact pt for future appointment. 

## 2021-06-27 ENCOUNTER — Other Ambulatory Visit: Payer: Self-pay | Admitting: Nurse Practitioner

## 2021-06-28 NOTE — Telephone Encounter (Signed)
Prescription refill request for Eliquis received. Indication: Atrial Fib Last office visit: 06/13/21  Rolena Infante FNP Scr: 1.93 on 06/03/21 Weight: 83.5kg  Based on above findings Eliquis '5mg'$  twice daily is the appropriate dose.  Refill approved.

## 2021-07-04 ENCOUNTER — Other Ambulatory Visit: Payer: Self-pay | Admitting: Nurse Practitioner

## 2021-07-06 ENCOUNTER — Telehealth: Payer: Self-pay | Admitting: Cardiovascular Disease

## 2021-07-06 NOTE — Telephone Encounter (Signed)
See the original preop clearance note from April.

## 2021-07-06 NOTE — Telephone Encounter (Signed)
Patient was recently seen by Murray Hodgkins, NP at which time he was felt to be volume overloaded.  He was admitted admitted to the hospital and underwent diuresis.  Since discharge, patient has been seen by [facility] heart failure clinic on 5/9 at which time he appeared to be euvolemic.  His weight is down 25 pounds when compared to a month ago.  His colonoscopy is on 6/8, he has a follow-up with Murray Hodgkins NP on 5/7, although cardiac clearance can be given during the office visit, however will need to start holding Eliquis prior to that.  Clinical pharmacist to review Eliquis

## 2021-07-06 NOTE — Telephone Encounter (Signed)
Genie calling to f/u on Clearance that was sent via fax on 5/17 and 06/26/21. She states pt is due to have procedure on 07/13/21. Please advise

## 2021-07-06 NOTE — Telephone Encounter (Signed)
Patient with diagnosis of afib on Eliquis for anticoagulation.    Procedure: colonoscopy Date of procedure: 07/13/21  CHA2DS2-VASc Score = 5   This indicates a 7.2% annual risk of stroke. The patient's score is based upon: CHF History: 1 HTN History: 1 Diabetes History: 1 Stroke History: 0 Vascular Disease History: 1 Age Score: 1 Gender Score: 0      CrCl 33.5 ml.min  Per office protocol, patient can hold Eliquis for 2 days prior to procedure.

## 2021-07-06 NOTE — Telephone Encounter (Signed)
I will have the pre op provider review to see if the pt has been cleared. I see the clearance request from 05/2021.

## 2021-07-07 NOTE — Telephone Encounter (Signed)
Call Placed this morning to review preop instructions for upcoming colonoscopy.  Patient not available at this time and detailed voicemail left with instructions to return call.

## 2021-07-07 NOTE — Telephone Encounter (Signed)
Patient contacted this morning to review Eliquis instructions regarding upcoming colonoscopy on 07/13/2021.  Patient not available at this time detailed message left with instructions to return call at earliest convenience.  Ambrose Pancoast, NP

## 2021-07-10 NOTE — Telephone Encounter (Signed)
Left detailed message for the pt in regard to holding his Eliquis. I did ask for a call back to confirm her did receive the message. I will send this message as FYI to requesting office in hopes that they may speak with the pt. If do s/w the pt to please let the pt know he needs to call his cardiologist office about the blood thinner.

## 2021-07-10 NOTE — Telephone Encounter (Addendum)
I have left a message for the patient to hold Eliquis for 2 days prior to the procedure.  The rest of the cardiac clearance can be addressed during office visit this Wednesday.  I asked the patient to call back to our office to confirm that he has received our instruction.  My colleague has been trying to reach the patient multiple times last week without success. (See duplicate clearance on 6/1)

## 2021-07-12 ENCOUNTER — Other Ambulatory Visit
Admission: RE | Admit: 2021-07-12 | Discharge: 2021-07-12 | Disposition: A | Discharge status: Home / Self Care | Payer: PPO | Source: Ambulatory Visit | Attending: Nurse Practitioner | Admitting: Nurse Practitioner

## 2021-07-12 ENCOUNTER — Encounter: Payer: Self-pay | Admitting: Gastroenterology

## 2021-07-12 ENCOUNTER — Encounter: Payer: Self-pay | Admitting: Nurse Practitioner

## 2021-07-12 ENCOUNTER — Ambulatory Visit (INDEPENDENT_AMBULATORY_CARE_PROVIDER_SITE_OTHER): Payer: PPO | Admitting: Nurse Practitioner

## 2021-07-12 ENCOUNTER — Telehealth: Payer: Self-pay | Admitting: Nurse Practitioner

## 2021-07-12 ENCOUNTER — Other Ambulatory Visit (HOSPITAL_COMMUNITY): Payer: Self-pay

## 2021-07-12 VITALS — BP 108/60 | HR 66 | Ht 69.0 in | Wt 173.8 lb

## 2021-07-12 DIAGNOSIS — I5023 Acute on chronic systolic (congestive) heart failure: Secondary | ICD-10-CM | POA: Diagnosis not present

## 2021-07-12 DIAGNOSIS — E785 Hyperlipidemia, unspecified: Secondary | ICD-10-CM

## 2021-07-12 DIAGNOSIS — I5022 Chronic systolic (congestive) heart failure: Secondary | ICD-10-CM | POA: Diagnosis not present

## 2021-07-12 DIAGNOSIS — I2510 Atherosclerotic heart disease of native coronary artery without angina pectoris: Secondary | ICD-10-CM | POA: Diagnosis not present

## 2021-07-12 DIAGNOSIS — N1832 Chronic kidney disease, stage 3b: Secondary | ICD-10-CM | POA: Diagnosis not present

## 2021-07-12 DIAGNOSIS — I493 Ventricular premature depolarization: Secondary | ICD-10-CM

## 2021-07-12 DIAGNOSIS — Z79899 Other long term (current) drug therapy: Secondary | ICD-10-CM

## 2021-07-12 DIAGNOSIS — I4819 Other persistent atrial fibrillation: Secondary | ICD-10-CM

## 2021-07-12 DIAGNOSIS — I255 Ischemic cardiomyopathy: Secondary | ICD-10-CM | POA: Diagnosis not present

## 2021-07-12 DIAGNOSIS — D696 Thrombocytopenia, unspecified: Secondary | ICD-10-CM

## 2021-07-12 LAB — BASIC METABOLIC PANEL
Anion gap: 6 (ref 5–15)
BUN: 31 mg/dL — ABNORMAL HIGH (ref 8–23)
CO2: 26 mmol/L (ref 22–32)
Calcium: 8.8 mg/dL — ABNORMAL LOW (ref 8.9–10.3)
Chloride: 114 mmol/L — ABNORMAL HIGH (ref 98–111)
Creatinine, Ser: 1.65 mg/dL — ABNORMAL HIGH (ref 0.61–1.24)
GFR, Estimated: 43 mL/min — ABNORMAL LOW (ref >60)
Glucose, Bld: 108 mg/dL — ABNORMAL HIGH (ref 70–99)
Potassium: 3 mmol/L — ABNORMAL LOW (ref 3.5–5.1)
Sodium: 146 mmol/L — ABNORMAL HIGH (ref 135–145)

## 2021-07-12 MED ORDER — TORSEMIDE 20 MG PO TABS: 20.0000 mg | ORAL_TABLET | ORAL | 3 refills | Status: DC

## 2021-07-12 MED ORDER — TORSEMIDE 60 MG PO TABS: 60.0000 mg | ORAL_TABLET | ORAL | 1 refills | Status: DC

## 2021-07-12 NOTE — Telephone Encounter (Signed)
Spoke with pharmacist and they are not able to get the 60 mg pill. Updated instructions verbally and read back was done. Will reach out to patient to update as well.

## 2021-07-12 NOTE — Telephone Encounter (Signed)
Pt c/o medication issue:  1. Name of Medication:   Torsemide 60 MG TABS    2. How are you currently taking this medication (dosage and times per day)? Take 60 mg by mouth as directed. Take one and one half tablet (80 mg) twice a day for 3 days THEN take 1 tablet (60 mg) twice a day  3. Are you having a reaction (difficulty breathing--STAT)? NO  4. What is your medication issue?  Pharmacy states that mediation does not come in a 60 MG tablet. Pharmacy says that it can be changes to a 20 MG tablet unless he wants the Brand name that does come in a 60 MG tablet. Please advise

## 2021-07-12 NOTE — Patient Instructions (Signed)
Medication Instructions:  Your physician has recommended you make the following change in your medication:   TAKE Torsemide 80 mg twice a day for 3 days THEN take Torsemide 60 mg twice a day  *If you need a refill on your cardiac medications before your next appointment, please call your pharmacy*   Lab Work: BMET today over at the [employer] at [place] to 1st desk on the right to check in (REGISTRATION)  Lab hours: Monday- Friday (7:30 am- 5:30 pm)  If you have labs (blood work) drawn today and your tests are completely normal, you will receive your results only by: MyChart Message (if you have MyChart) OR A paper copy in the mail If you have any lab test that is abnormal or we need to change your treatment, we will call you to review the results.   Testing/Procedures: None   Follow-Up: At [facility], you and your health needs are our priority.  As part of our continuing mission to provide you with exceptional heart care, we have created designated Provider Care Teams.  These Care Teams include your primary Cardiologist (physician) and Advanced Practice Providers (APPs -  Physician Assistants and Nurse Practitioners) who all work together to provide you with the care you need, when you need it.  We recommend signing up for the patient portal called "MyChart".  Sign up information is provided on this After Visit Summary.  MyChart is used to connect with patients for Virtual Visits (Telemedicine).  Patients are able to view lab/test results, encounter notes, upcoming appointments, etc.  Non-urgent messages can be sent to your provider as well.   To learn more about what you can do with MyChart, go to [email or web address].    Your next appointment:   1 week(s)  The format for your next appointment:   In Person  Provider:   Kathlyn Sacramento, MD or Murray Hodgkins, NP       Important Information About Sugar

## 2021-07-12 NOTE — Telephone Encounter (Signed)
Reviewed updated instructions as follows with patients wife: Torsemide 4 tablets (80 mg) twice a day for 3 days THEN go to Torsemide 3 tablets (60 mg) twice a day.  Updated instructions provided to patient and pharmacy and they verbalized understanding.

## 2021-07-12 NOTE — Progress Notes (Signed)
Today had a home visit with [name] and wife.  He is kind of a quiet person.  Explained the program to him and he is wanting to be part of it.  We went through his medication bottles and he has all his medications.  He states he takes a lot.  Advised him I can bring boxes and put them all in it for him, he sounded so appreciative.  Will visit again next week to fix his meds and will continue to fix them every 2 weeks.  He has colonoscopy tomorrow, appears nervous about it.  He asked me several times hor he takes his medications for the procedure.  His wife is aware how to take it.  He states does not have any problems affording his medications.  He states has everything he needs for daily living.  His wife keeps a calendar with appts on it.  Between the 2 of them they keep each other in line.  She still work a few days a week.  He walks with no assistance.  They have a few steps going up into their home, he states has no problem getting in.  Took the time to listen to him and wife and get to know them and how I can assist them.  He has no edema today.  He sits in a chair with feet down watching tv on a desk.  Will discuss later if and how he can elevate his feet.  They both still drive and they have a daughter that lives with them.  He states he has been really sick and can not do what he use to.  Encouraged him.  He denies any specific problems such as chest pain, headaches, dizziness or increased shortness of breath. Will visit for heart failure, diet and medication management.   Nile Dear AlamanceEMT-Paramedic [phone]

## 2021-07-12 NOTE — Telephone Encounter (Signed)
I will forward this to Ignacia Bayley, NP as the pt has appt with him today. Pt has not returned our calls in regard to his blood thinner. See previous notes.

## 2021-07-12 NOTE — Progress Notes (Addendum)
Office Visit    Patient Name: [name]. Date of Encounter: 07/12/2021  Primary Care Provider:  Leone Haven, MD Primary Cardiologist:  Kathlyn Sacramento, MD  Chief Complaint    76 year old male with history of CAD status post circumflex stenting, chronic HFrEF, mixed ischemic and nonischemic cardiomyopathy, hypertension, hyperlipidemia, stage III chronic kidney disease, and colon cancer, who presents for heart failure follow-up.  Past Medical History    Past Medical History:  Diagnosis Date   Adenocarcinoma of colon ([place]) 06/19/2013   a.) moderately differentiated stage IIIc (T4bN2a M0) adenocarcinoma of colon  s/p colectomy followed by chemoRx with oxaliplatin & fluorouacil.   Anemia    CAD (coronary artery disease)    a. 02/2015 Cath: LM nl, LAD 50/40 mid/distal, LCX 80p, RCA 40p, 30d, RPL1 40, CO 3.27, CI 1.65; b. 06/09/15 PCI: LM minor irregs, m-dLAD 50%, dLAD 40%, pLCx 80% (s/p PCI/DES 0%), pRCA 40%, dRCA 30%, 1st RPLB 40%   Chronic kidney disease, stage 3a (HCC)    Chronic systolic CHF (congestive heart failure) ([place])    a. 02/2015 Echo: EF 20-25%, diff HK, mod MR, mildy dil LA, mildly dil RV w/ mod RV syst dysfxn, mildly dil RA, mod TR, PASP 23mHg; b. 09/2017 Echo: EF 40-45%, diff HK. Gr1 DD. Mild to mod MR. Mildly to mod dil LA; c. 09/2019 Echo: EF 25-30%, glob HK, mod reduced RV fxn, sev elev PASP. Mod dil LA. Mild-mod MR; d. 05/2021 Echo: EF 20-25%, glob HK, sev red RV fxn, sev BAE, sev MR/TR/TS, triv AI.   Diabetes mellitus without complication ([place]    Essential hypertension    Hypertensive heart disease    Left inguinal hernia 05/2020   Mixed Ischemic and Non-ischemic Cardiomyopathy    a. 02/2015 EF 20-25%, diff HK; b. 09/2016 EF 40-45%; c. 09/2019 EF 25-30%; d. 05/2021 Echo: EF 20-25%, glob HK.   Moderate mitral regurgitation    a. 09/2017 Echo: Mild to mod MR; a. 09/2019 Echo: mild-mod MR.   Persistent atrial fibrillation ([place]    a. Dx 05/2021-->CHA2DS2VASc =  5-->eliquis.   Pulmonary hypertension (HCC)    Tubular adenoma of colon    Past Surgical History:  Procedure Laterality Date   APPENDECTOMY N/A 06/19/2017   Location: [facility]; Surgeon: WConsuela Mimes MD   CARDIAC CATHETERIZATION Bilateral 02/24/2015   Procedure: Right/Left Heart Cath and Coronary Angiography;  Surgeon: MWellington Hampshire MD;  [facility];  Service: Cardiovascular;  Laterality: Bilateral;   CARDIAC CATHETERIZATION N/A 06/09/2015   Procedure: Coronary Stent Intervention (3.0 x 12 mm Xience Alpine DES to LCx);  Surgeon: MWellington Hampshire MD;  [facility];  Service: Cardiovascular;  Laterality: N/A   COLECTOMY  06/19/2017   Descending and proximal sigmoid colectomy, LEFT ureterolysis, partial cecectomy, appendectomy; [place] [facility]geon: WConsuela Mimes MD   COLONOSCOPY     COLONOSCOPY WITH PROPOFOL N/A 12/11/2016   Procedure: COLONOSCOPY WITH PROPOFOL;  Surgeon: SLollie Sails MD;  [facility];  Service: Endoscopy;  Laterality: N/A;   ESOPHAGOGASTRODUODENOSCOPY     PORT-A-CATH REMOVAL N/A 07/12/2014   Procedure: REMOVAL PORT-A-CATH;  Surgeon: WMolly Maduro MD;  Location: [facility];  Service: General;  Laterality: N/A;   PORTACATH PLACEMENT Left 2015    Allergies  Allergies  Allergen Reactions   Shellfish Allergy Other (See Comments) and Hives    Pt. instructed by MD to avoid seafood    History of Present Illness  76 year old male with the above complex past medical history including CAD status post circumflex stenting, chronic HFrEF, mixed ischemic and nonischemic cardiomyopathy, hypertension, hyperlipidemia, stage III chronic kidney disease, and colon cancer.  In the setting of colon cancer, he underwent colectomy in 2015, followed by chemotherapy with oxaliplatin and fluorouracil.  He subsequently developed neuropathy.  In January 2017, he was diagnosed with systolic heart failure when  echocardiogram showed an EF of 20 to 25% with moderate mitral regurgitation/tricuspid regurgitation, and moderate to severe pulmonary hypertension.  Right and left heart catheterization showed moderate to severe pulmonary hypertension with severely elevated filling pressures and severely reduced cardiac output.  Coronary angiography showed severe one-vessel CAD with an 80 to 90% stenosis in the proximal left circumflex, and mild diffuse disease affecting the LAD and RCA.  Following initiation of heart failure medications and adequate diuresis, he subsequently went staged PCI of the left circumflex in May 2017.  Follow-up echo in November 2020, showed EF of 30 to 35%.  In the setting of PVCs, he underwent outpatient monitoring at that time, which showed a 6% PVC burden, which improved with resumption of carvedilol.  Echocardiogram in April 2021 showed persistent LV dysfunction with an EF of 25 to 30%, severe pulmonary hypertension, and mild to moderate mitral regurgitation.  He has been offered referral to EP for consideration of ICD therapy in the past however, he declined.  [name] was seen in cardiology clinic in late April 2023 and was noted to be markedly volume overloaded.  He was also noted to be in atrial fibrillation, which was new.  Admission was offered however he initially declined.  Diuretics were adjusted and he was placed on oral anticoagulation.  Due to progressive symptoms, he subsequently presented to the emergency department on April 24, and was admitted for heart failure exacerbation.  He was aggressively diuresed with significant clinical improvement and drop in weight from 81.2 kg on admission to 68.85 kg on discharge.  Echo showed an EF of 20 to 25% with severely dilated left ventricle, severely reduced right ventricle, severe biatrial enlargement, severe mitral and tricuspid regurgitation, circumferential pericardial effusion, and trivial AI.  Following discharge, he was seen in heart  failure clinic on May 9 where his weight was up from 151 to 159 lbs.  He was felt to be euvolemic.  Para-medicine reached out to the patient on May 18 however, he was not home at the time of the scheduled visit.  Today, his wt is up to 173 lbs (admission weight 4/26 was 179 lbs).  Initially, Mr. Blessinger indicated that he has been feeling well and was not aware that he was retaining fluid or that his weight has been going up.  On further questioning and following examination however, he admitted to feeling poorly with dyspnea on exertion and unsteadiness in his gait.  He tires easily and has been fatigued.  He denies chest pain, palpitations, PND, dizziness, syncope.  He has had orthopnea..  He notes that he has missed a few doses of torsemide but says he has been compliant with other medications.  He has not taking any of his medicines in the past 2 days however.  He is supposed to be doing a bowel prep tonight for colonoscopy tomorrow, which I told him would need to be canceled.  Home Medications    Current Outpatient Medications  Medication Sig Dispense Refill   amLODipine (NORVASC) 2.5 MG tablet Take 2.5 mg by mouth daily.     apixaban (  ELIQUIS) 5 MG TABS tablet TAKE 1 TABLET(5 MG) BY MOUTH TWICE DAILY 60 tablet 5   atorvastatin (LIPITOR) 40 MG tablet Take 1 tablet (40 mg total) by mouth daily. 90 tablet 3   carvedilol (COREG) 3.125 MG tablet Take 1 tablet (3.125 mg total) by mouth 2 (two) times daily with a meal. 180 tablet 3   cholecalciferol (VITAMIN D3) 25 MCG (1000 UNIT) tablet Take 1,000 Units by mouth daily.     docusate sodium (COLACE) 100 MG capsule Take 1 capsule (100 mg total) by mouth daily as needed for mild constipation. 30 capsule 5   gabapentin (NEURONTIN) 100 MG capsule Take 100 mg by mouth 3 (three) times daily.     loratadine (CLARITIN) 10 MG tablet Take 1 tablet (10 mg total) by mouth daily. 30 tablet 11   sacubitril-valsartan (ENTRESTO) 24-26 MG Take 1 tablet by mouth 2 (two)  times daily. 180 tablet 3   spironolactone (ALDACTONE) 25 MG tablet Take 1 tablet (25 mg total) by mouth daily. 30 tablet 0   Lidocaine 4 % GEL Apply 1 application. topically 4 (four) times daily as needed. (Patient not taking: Reported on 07/12/2021) 113 g 0   torsemide (DEMADEX) 20 MG tablet Take 1 tablet (20 mg total) by mouth as directed. Take 4 tablets (80 mg) twice a day for 3 days THEN take 3 tablets (60 mg) twice a day 186 tablet 3   No current facility-administered medications for this visit.     Review of Systems    He has been feeling poorly with fatigue, dyspnea, orthopnea, and unsteady gait.  He is markedly edematous however, he says he did not realize it.  He denies palpitations, PND, dizziness, syncope, or early satiety.  All other systems reviewed and are otherwise negative except as noted above.    Physical Exam    VS:  BP 108/60 (BP Location: Left Arm, Patient Position: Sitting, Cuff Size: Normal)   Pulse 66   Ht '5\' 9"'$  (1.753 m)   Wt 173 lb 12.8 oz (78.8 kg)   SpO2 93%   BMI 25.67 kg/m  , BMI Body mass index is 25.67 kg/m.     GEN: Well nourished, well developed, in no acute distress. HEENT: normal. Neck: Supple, JVD to jaw.  No bruits or masses.   Cardiac: Irregularly irregular, 2/6 systolic murmur throughout, no rubs or gallops. No clubbing, cyanosis.  He has 2+ bilateral woody edema extending from his ankles to his lower abdomen and flanks. Radials 2+/PT 1+ and equal bilaterally.  Respiratory:  Respirations regular and unlabored, bibasilar crackles. GI: The lower abdomen is firm with 2+ edema.  Nontender.  BS + x 4. MS: no deformity or atrophy. Skin: warm and dry, no rash. Neuro:  Strength and sensation are intact. Psych: Normal affect.  Accessory Clinical Findings    ECG personally reviewed by me today -atrial fibrillation, 66, prolonged QT, lateral T wave inversion- no acute changes.  Lab Results  Component Value Date   WBC 4.2 06/01/2021   HGB 11.7 (L)  06/01/2021   HCT 36.6 (L) 06/01/2021   MCV 102.8 (H) 06/01/2021   PLT 95 (L) 06/01/2021   Lab Results  Component Value Date   CREATININE 1.65 (H) 07/12/2021   BUN 31 (H) 07/12/2021   NA 146 (H) 07/12/2021   K 3.0 (L) 07/12/2021   CL 114 (H) 07/12/2021   CO2 26 07/12/2021   Lab Results  Component Value Date   ALT 53 (H) 05/29/2021  AST 55 (H) 05/29/2021   ALKPHOS 102 05/29/2021   BILITOT 3.2 (H) 05/29/2021   Lab Results  Component Value Date   CHOL 113 03/10/2021   HDL 45.20 03/10/2021   LDLCALC 56 03/10/2021   TRIG 58.0 03/10/2021   CHOLHDL 3 03/10/2021    Lab Results  Component Value Date   HGBA1C 6.5 03/10/2021    Assessment & Plan    1.  Acute on chronic heart failure with reduced ejection fraction/mixed ischemic/nonischemic cardiomyopathy: Patient recently hospitalized secondary to significant weight gain and volume overload with echo showing an EF of 20 to 25% with severe biventricular failure, and severe mitral and tricuspid regurgitation.  He was aggressively diuresed down to 151 pounds at discharge.  He has not been weighing himself at home and sometimes misses torsemide doses.  Though he initially denied it, he subsequently admitted to feeling worse over the past several weeks with dyspnea on exertion, fatigue, and unsteady gait.  He is markedly volume overloaded on exam with a weight of 173 pounds, which is just 6 pounds below where we admitted him last time.  I offered admission today however, he again prefers to try adjustment of outpatient diuretics, similar to his last visit.  We will increase torsemide to 80 mg twice daily for the next 3 days and then he will drop back down to 60 mg twice daily (currently on 40 mg twice daily).  Basic metabolic panel today shows hypokalemia with a potassium of 3.0 and relatively stable renal dysfunction with a BUN/creatinine of 31 and 1.65 respectively.  I will send in potassium supplementation and plan to see him back in 1 week.   Continue current doses of beta-blocker, Entresto, and spironolactone.  Stressed the importance of compliance and low threshold for presentation to the ED for any worsening of symptoms prior to follow-up.  2.  Persistent atrial fibrillation: This was diagnosed at visit in April.  Unclear to the extent that this is playing a role in his heart failure.  He is well rate controlled on beta-blocker therapy.  Defer any consideration for rhythm management until volume has improved.  Also, compliance with anticoagulation is unclear and he would likely need TEE when cardioversion is considered.  Continue oral anticoagulation with Eliquis.  3.  Coronary artery disease: Status post circumflex stenting in 2017.  He has not been having any chest pain.  He remains on statin therapy.  Aspirin was stopped in the setting of initiation of Eliquis in April.  4.  Essential hypertension: Stable.  5.  Hyperlipidemia: LDL of 56 in February.  Continue statin therapy.  6.  Stage III chronic kidney disease: Creatinine stable on lab work today at 1.65.  We will plan to follow-up next week given titration of diuretic therapy.  7.  Hypokalemia: Potassium 3.0 on labs today.  Will order supplementation.  8.  Anemia/thrombocytopenia: Stable throughout admission in April.  We will plan to follow-up next week.  9.  Pre-procedure eval:  pt pending colonoscopy tomorrow.  I told him and also reached out to [facility] to inform them that w/ his massive volume overload, at present, he is in no condition to undergo an elective colonoscopy, and I recommended that his case be cancelled.  10.  Disposition: Labs followed up today.  Follow-up again in 1 week.  Murray Hodgkins, NP 07/12/2021, 5:15 PM

## 2021-07-13 ENCOUNTER — Telehealth: Payer: Self-pay

## 2021-07-13 ENCOUNTER — Ambulatory Visit: Admission: RE | Admit: 2021-07-13 | Payer: PPO | Source: Home / Self Care | Admitting: Gastroenterology

## 2021-07-13 ENCOUNTER — Telehealth: Payer: Self-pay | Admitting: Nurse Practitioner

## 2021-07-13 ENCOUNTER — Encounter: Admission: RE | Payer: Self-pay | Source: Home / Self Care

## 2021-07-13 SURGERY — COLONOSCOPY
Anesthesia: General

## 2021-07-13 MED ORDER — POTASSIUM CHLORIDE CRYS ER 20 MEQ PO TBCR: 40.0000 meq | EXTENDED_RELEASE_TABLET | Freq: Every day | ORAL | 6 refills | Status: DC

## 2021-07-13 NOTE — Telephone Encounter (Signed)
-----   Message from Emily Filbert, RN sent at 07/13/2021 11:36 AM EDT -----  ----- Message ----- From: Theora Gianotti, NP Sent: 07/12/2021   5:22 PM EDT To: Valora Corporal, RN  Kidney function relatively stable.  Potassium is low at 3.0.  Please start potassium chloride 40 mEq daily.

## 2021-07-13 NOTE — Telephone Encounter (Signed)
New Message:   Randall Hiss from [employer]. He said he received 2 prescriptions for Torsemide with 60 mg and  20 mg. He needs the correct directions for them please.  He said the 60 mg comes only in brand name. He wants to know if the 60 mg can be changes to another dose, if both milligrams are supposed to be filled.

## 2021-07-13 NOTE — Telephone Encounter (Signed)
Please advise if pt should have to prescriptions for Torsemide.

## 2021-07-13 NOTE — Telephone Encounter (Signed)
Spoke w/ pt's wife.  Advised her of Chris's recommendation. She verbalizes understanding and requests a 30 day supply be sent in.  She is appreciative of the call.

## 2021-07-14 ENCOUNTER — Ambulatory Visit: Payer: PPO | Admitting: Family

## 2021-07-14 ENCOUNTER — Telehealth: Payer: Self-pay | Admitting: Family
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
## 2021-07-14 NOTE — Telephone Encounter (Signed)
Patient did not show for his Heart Failure Clinic appointment on 07/14/21. Will attempt to reschedule.

## 2021-07-17 ENCOUNTER — Inpatient Hospital Stay: Payer: PPO | Attending: Oncology

## 2021-07-17 NOTE — Telephone Encounter (Signed)
Spoke with Autumn pharmacist at [facility] and discussed medication clarification. Instructions reviewed as follows:   Torsemide 4 tablets (80 mg) twice a day for 3 days THEN go to Torsemide 3 tablets (60 mg) twice a day.  Patient was notified of correct instructions, Autumn pharmacist read back instructions, and no further needs at this time.

## 2021-07-18 ENCOUNTER — Other Ambulatory Visit (HOSPITAL_COMMUNITY): Payer: Self-pay

## 2021-07-18 ENCOUNTER — Other Ambulatory Visit: Payer: Self-pay | Admitting: Family

## 2021-07-18 MED ORDER — SPIRONOLACTONE 25 MG PO TABS
25.0000 mg | ORAL_TABLET | Freq: Every day | ORAL | 0 refills | Status: DC
Start: 2021-07-18 — End: 2021-09-04

## 2021-07-18 NOTE — Progress Notes (Signed)
Today had a home visit with [name].  He states he has been urinating a lot and he has since lost 4 lbs.  He did what Sharolyn Douglas advised and took 4 torsemide twice daily.  He states taking his second dose around 6 and he is staying up during the night urinating.  Advised he could take them around 2.  He has lost 4 lbs and states feels better and edema has went down.  Brought him 2 med boxes and placed his meds in his boxes.  He only has 2 more days of spironolactone and 9 days of entersto left.  Called them into his pharmacy and he states does not have the money to get them.  He states will borrow from a friend.  Will let him know the cost and see if he will pick them up.  Advised him that I will be back on Wednesday or Thursday to place his spironolactone in his boxes.  He is appreciative of the help.  He states haves everything he needs for daily living.  He lives with wife and she still works part time.  He states has plenty of food and daily living supplies.  He was sitting eating breakfast when arrived. Very pleasant man to visit.  He has family close by and a son that lives with him sometimes.  His wife keeps up with his appts, he missed a HF clinic appt, discussed that, he was not aware of it.  Will get them to reschedule.  Will check back with him tomorrow to see if he was able to get his meds.  Will continue to visit for heart failure, diet and medication management.   [place] [phone]

## 2021-07-19 ENCOUNTER — Encounter: Payer: Self-pay | Admitting: Nurse Practitioner

## 2021-07-19 ENCOUNTER — Other Ambulatory Visit
Admission: RE | Admit: 2021-07-19 | Discharge: 2021-07-19 | Disposition: A | Discharge status: Home / Self Care | Payer: PPO | Attending: Nurse Practitioner | Admitting: Nurse Practitioner

## 2021-07-19 ENCOUNTER — Ambulatory Visit (INDEPENDENT_AMBULATORY_CARE_PROVIDER_SITE_OTHER): Payer: PPO | Admitting: Nurse Practitioner

## 2021-07-19 VITALS — BP 94/60 | HR 60 | Ht 69.0 in | Wt 156.6 lb

## 2021-07-19 DIAGNOSIS — N1832 Chronic kidney disease, stage 3b: Secondary | ICD-10-CM | POA: Insufficient documentation

## 2021-07-19 DIAGNOSIS — I4819 Other persistent atrial fibrillation: Secondary | ICD-10-CM | POA: Insufficient documentation

## 2021-07-19 DIAGNOSIS — E876 Hypokalemia: Secondary | ICD-10-CM

## 2021-07-19 DIAGNOSIS — I5023 Acute on chronic systolic (congestive) heart failure: Secondary | ICD-10-CM

## 2021-07-19 DIAGNOSIS — I429 Cardiomyopathy, unspecified: Secondary | ICD-10-CM | POA: Diagnosis not present

## 2021-07-19 DIAGNOSIS — I2510 Atherosclerotic heart disease of native coronary artery without angina pectoris: Secondary | ICD-10-CM | POA: Diagnosis not present

## 2021-07-19 DIAGNOSIS — I10 Essential (primary) hypertension: Secondary | ICD-10-CM | POA: Diagnosis not present

## 2021-07-19 DIAGNOSIS — E785 Hyperlipidemia, unspecified: Secondary | ICD-10-CM

## 2021-07-19 LAB — BASIC METABOLIC PANEL
Anion gap: 5 (ref 5–15)
BUN: 29 mg/dL — ABNORMAL HIGH (ref 8–23)
CO2: 32 mmol/L (ref 22–32)
Calcium: 8.8 mg/dL — ABNORMAL LOW (ref 8.9–10.3)
Chloride: 103 mmol/L (ref 98–111)
Creatinine, Ser: 1.65 mg/dL — ABNORMAL HIGH (ref 0.61–1.24)
GFR, Estimated: 43 mL/min — ABNORMAL LOW (ref >60)
Glucose, Bld: 134 mg/dL — ABNORMAL HIGH (ref 70–99)
Potassium: 4.1 mmol/L (ref 3.5–5.1)
Sodium: 140 mmol/L (ref 135–145)

## 2021-07-19 NOTE — Progress Notes (Signed)
Office Visit    Patient Name: [name]. Date of Encounter: 07/19/2021  Primary Care Provider:  Leone Haven, MD Primary Cardiologist:  Christopher Sacramento, MD  Chief Complaint    76 year old male with history of CAD status post circumflex stenting, chronic HFrEF, mixed ischemic and nonischemic cardiomyopathy, hypertension, hyperlipidemia, stage III chronic kidney disease, and colon cancer, who presents for heart failure follow-up.  Past Medical History    Past Medical History:  Diagnosis Date   Adenocarcinoma of colon ([place]) 06/19/2013   a.) moderately differentiated stage IIIc (T4bN2a M0) adenocarcinoma of colon  s/p colectomy followed by chemoRx with oxaliplatin & fluorouacil.   Anemia    CAD (coronary artery disease)    a. 02/2015 Cath: LM nl, LAD 50/40 mid/distal, LCX 80p, RCA 40p, 30d, RPL1 40, CO 3.27, CI 1.65; b. 06/09/15 PCI: LM minor irregs, m-dLAD 50%, dLAD 40%, pLCx 80% (s/p PCI/DES 0%), pRCA 40%, dRCA 30%, 1st RPLB 40%   Chronic kidney disease, stage 3a (HCC)    Chronic systolic CHF (congestive heart failure) ([place])    a. 02/2015 Echo: EF 20-25%, diff HK, mod MR, mildy dil LA, mildly dil RV w/ mod RV syst dysfxn, mildly dil RA, mod TR, PASP 30mHg; b. 09/2017 Echo: EF 40-45%, diff HK. Gr1 DD. Mild to mod MR. Mildly to mod dil LA; c. 09/2019 Echo: EF 25-30%, glob HK, mod reduced RV fxn, sev elev PASP. Mod dil LA. Mild-mod MR; d. 05/2021 Echo: EF 20-25%, glob HK, sev red RV fxn, sev BAE, sev MR/TR/TS, triv AI.   Diabetes mellitus without complication ([place]    Essential hypertension    Hypertensive heart disease    Left inguinal hernia 05/2020   Mixed Ischemic and Non-ischemic Cardiomyopathy    a. 02/2015 EF 20-25%, diff HK; b. 09/2016 EF 40-45%; c. 09/2019 EF 25-30%; d. 05/2021 Echo: EF 20-25%, glob HK.   Moderate mitral regurgitation    a. 09/2017 Echo: Mild to mod MR; a. 09/2019 Echo: mild-mod MR.   Persistent atrial fibrillation ([place]    a. Dx 05/2021-->CHA2DS2VASc =  5-->eliquis.   Pulmonary hypertension (HCC)    Tubular adenoma of colon    Past Surgical History:  Procedure Laterality Date   APPENDECTOMY N/A 06/19/2017   Location: [facility]; Surgeon: Christopher Mimes MD   CARDIAC CATHETERIZATION Bilateral 02/24/2015   Procedure: Right/Left Heart Cath and Coronary Angiography;  Surgeon: MWellington Hampshire MD;  [facility];  Service: Cardiovascular;  Laterality: Bilateral;   CARDIAC CATHETERIZATION N/A 06/09/2015   Procedure: Coronary Stent Intervention (3.0 x 12 mm Xience Alpine DES to LCx);  Surgeon: MWellington Hampshire MD;  [facility];  Service: Cardiovascular;  Laterality: N/A   COLECTOMY  06/19/2017   Descending and proximal sigmoid colectomy, LEFT ureterolysis, partial cecectomy, appendectomy; [place] [facility]geon: Christopher Mimes MD   COLONOSCOPY     COLONOSCOPY WITH PROPOFOL N/A 12/11/2016   Procedure: COLONOSCOPY WITH PROPOFOL;  Surgeon: Christopher Sails MD;  [facility];  Service: Endoscopy;  Laterality: N/A;   ESOPHAGOGASTRODUODENOSCOPY     PORT-A-CATH REMOVAL N/A 07/12/2014   Procedure: REMOVAL PORT-A-CATH;  Surgeon: WMolly Maduro MD;  Location: [facility] ORS;  Service: General;  Laterality: N/A;   PORTACATH PLACEMENT Left 2015    Allergies  Allergies  Allergen Reactions   Shellfish Allergy Other (See Comments) and Hives    Pt. instructed by MD to avoid seafood    History of Present Illness  76 year old male with above complex past medical history including CAD status post circumflex stenting, chronic HFrEF, mixed ischemic and nonischemic cardiomyopathy, hypertension, hyperlipidemia, stage III chronic kidney disease, and colon cancer.  In the setting of colon cancer, he underwent colectomy 2015, followed by chemotherapy with oxaliplatin and fluorouracil.  He subsequently developed neuropathy.    In January 2017, he was diagnosed with systolic heart failure when echocardiogram  showed an EF of 20 to 25% with moderate mitral regurgitation/tricuspid regurgitation, and moderate to severe pulmonary hypertension.  Right and left heart catheterization showed moderate to severe pulmonary hypertension with severely elevated filling pressures and severely reduced cardiac output.  Coronary angiography showed severe one-vessel CAD with an 80 to 90% stenosis in the proximal left circumflex, and mild diffuse disease affecting the LAD and RCA.  Following initiation of heart failure medications and adequate diuresis, he subsequently went staged PCI of the left circumflex in May 2017.  Follow-up echo in November 2020, showed EF of 30 to 35%.  In the setting of PVCs, he underwent outpatient monitoring at that time, which showed a 6% PVC burden, which improved with resumption of carvedilol.  Echocardiogram in April 2021 showed persistent LV dysfunction with an EF of 25 to 30%, severe pulmonary hypertension, and mild to moderate mitral regurgitation.  He has been offered referral to EP for consideration of ICD therapy in the past however, he declined.  He required admission in late April 2023 in the setting of massive volume overload and weight gain.  Echo during admission showed an EF of 20 to 25% with severely dilated left ventricle, severely reduced RV function, severe biatrial enlargement, severe mitral and tricuspid regurgitation, circumferential pericardial effusion, and trivial AI.  He was diuresed down from 81.2 kg of admission to 68.85 kg (151 pounds) on discharge.  He followed up in cardiology clinic last week, June 7, and weight was up to 173 pounds associated with significant lower extremity and abdominal edema.  He noted occasionally missing torsemide and declined admission for IV diuresis.  Torsemide was increased to 80 mg twice daily x3 days and then 60 mg twice daily with plan for follow-up today.  [name] has noted significant response from higher dose of torsemide.  His weight is down  approximately 17 pounds.  He still has lower extremity woody edema but is otherwise much improved with significant symptomatic improvement as well.  He notes more energy and less dyspnea.  He denies chest pain, palpitations, PND, orthopnea, dizziness, syncope, or early satiety.  We discussed his atrial fibrillation today.  He is not interested in pursuing cardioversion.  Home Medications    Current Outpatient Medications  Medication Sig Dispense Refill   amLODipine (NORVASC) 2.5 MG tablet Take 2.5 mg by mouth daily.     apixaban (ELIQUIS) 5 MG TABS tablet TAKE 1 TABLET(5 MG) BY MOUTH TWICE DAILY 60 tablet 5   atorvastatin (LIPITOR) 40 MG tablet Take 1 tablet (40 mg total) by mouth daily. 90 tablet 3   carvedilol (COREG) 3.125 MG tablet Take 1 tablet (3.125 mg total) by mouth 2 (two) times daily with a meal. 180 tablet 3   cholecalciferol (VITAMIN D3) 25 MCG (1000 UNIT) tablet Take 1,000 Units by mouth daily.     docusate sodium (COLACE) 100 MG capsule Take 1 capsule (100 mg total) by mouth daily as needed for mild constipation. 30 capsule 5   ferrous sulfate 325 (65 FE) MG tablet Take 325 mg by mouth daily with breakfast.  gabapentin (NEURONTIN) 100 MG capsule Take 100 mg by mouth 3 (three) times daily. Taking 2 -100 mg pills in am and 1 -100 mg at night     Lidocaine 4 % GEL Apply 1 application. topically 4 (four) times daily as needed. (Patient not taking: Reported on 07/12/2021) 113 g 0   loratadine (CLARITIN) 10 MG tablet Take 1 tablet (10 mg total) by mouth daily. 30 tablet 11   potassium chloride SA (KLOR-CON M20) 20 MEQ tablet Take 2 tablets (40 mEq total) by mouth daily. 60 tablet 6   sacubitril-valsartan (ENTRESTO) 24-26 MG Take 1 tablet by mouth 2 (two) times daily. 180 tablet 3   spironolactone (ALDACTONE) 25 MG tablet Take 1 tablet (25 mg total) by mouth daily. 30 tablet 0   torsemide (DEMADEX) 20 MG tablet Take 1 tablet (20 mg total) by mouth as directed. Take 4 tablets (80 mg) twice  a day for 3 days THEN take 3 tablets (60 mg) twice a day 186 tablet 3   No current facility-administered medications for this visit.     Review of Systems    Improvement in lower extremity edema though still with woody lower leg edema.  He denies chest pain, dyspnea, palpitations, PND, orthopnea, dizziness, syncope, or early satiety.  All other systems reviewed and are otherwise negative except as noted above.    Physical Exam    VS:  BP 94/60 (BP Location: Left Arm, Patient Position: Sitting, Cuff Size: Normal)   Pulse 60   Ht '5\' 9"'$  (1.753 m)   Wt 156 lb 9.6 oz (71 kg)   SpO2 99%   BMI 23.13 kg/m  , BMI Body mass index is 23.13 kg/m.     GEN: Well nourished, well developed, in no acute distress. HEENT: normal. Neck: Supple, no JVD, carotid bruits, or masses. Cardiac: Irregularly irregular, 2/6 systolic murmur heard throughout, no rubs or gallops. No clubbing, cyanosis.  1+ bilateral woody edema of his lower legs.  Radials 2+/PT 1+ and equal bilaterally.  Respiratory:  Respirations regular and unlabored, clear to auscultation bilaterally. GI: Soft, nontender, nondistended, BS + x 4. MS: no deformity or atrophy. Skin: warm and dry, no rash.  2 inch x 1-1/2 inch abrasion noted to the anterior right lower leg with clean margins and no drainage. Neuro:  Strength and sensation are intact. Psych: Normal affect.  Accessory Clinical Findings    ECG personally reviewed by me today -atrial fibrillation, 60, IVCD, lateral T wave inversion, prolonged QT (466 ms)- no acute changes.  Lab Results  Component Value Date   WBC 4.2 06/01/2021   HGB 11.7 (L) 06/01/2021   HCT 36.6 (L) 06/01/2021   MCV 102.8 (H) 06/01/2021   PLT 95 (L) 06/01/2021   Lab Results  Component Value Date   CREATININE 1.65 (H) 07/12/2021   BUN 31 (H) 07/12/2021   NA 146 (H) 07/12/2021   K 3.0 (L) 07/12/2021   CL 114 (H) 07/12/2021   CO2 26 07/12/2021   Lab Results  Component Value Date   ALT 53 (H)  05/29/2021   AST 55 (H) 05/29/2021   ALKPHOS 102 05/29/2021   BILITOT 3.2 (H) 05/29/2021   Lab Results  Component Value Date   CHOL 113 03/10/2021   HDL 45.20 03/10/2021   LDLCALC 56 03/10/2021   TRIG 58.0 03/10/2021   CHOLHDL 3 03/10/2021    Lab Results  Component Value Date   HGBA1C 6.5 03/10/2021    Assessment & Plan  1.  Acute on chronic heart failure with reduced ejection fraction/mixed ischemic and nonischemic cardiomyopathy: Patient hospitalized in April with volume overload.  Echo showed an EF of 20 to 25% with severe biventricular failure and severe mitral and tricuspid regurgitation.  He was aggressively diuresed down to 151 pounds.  At office follow-up 1 week ago, his weight was back up to 173 pounds, and he was markedly volume overloaded on examination with complaints of dyspnea and fatigue.  He may have been missing some doses of torsemide.  He has responded well to titration of torsemide and is currently taking 60 mg twice daily.  Follow-up basic metabolic panel today.  He has been off of Entresto as he ran out of it and does not have money to buy more.  We discussed potentially switching him to losartan pending labs today.  Continue carvedilol and spironolactone.  SGLT2 inhibitor therapy is likely cost prohibitive.  2.  Persistent atrial fibrillation: Diagnosed at visit in April 2023.  He is well rate controlled on beta-blocker therapy and anticoagulated with Eliquis.  We discussed rhythm management and he is not interested in anticoagulation or antiarrhythmic therapy at this time.  We will continue with rate control.  3.  Coronary disease: Status post circumflex stenting in 2017.  No chest pain.  He remains on statin therapy.  Aspirin was stopped in the setting of initiation of Eliquis in April.  4.  Essential hypertension: Blood pressure soft.  Follow-up labs today.  5.  Hyperlipidemia: LDL of 56 in February.  Continue statin therapy.  6.  Stage III chronic kidney  disease: Follow-up lab work this morning.  7.  Hypokalemia: Potassium was 3.0 on lab work last week.  He has been taking supplementation.  Follow-up lab work today.  8.  Disposition: Follow-up basic metabolic panel today.  Follow-up in clinic in 2 to 3 weeks or sooner if necessary.   Murray Hodgkins, NP 07/19/2021, 11:12 AM

## 2021-07-19 NOTE — Patient Instructions (Addendum)
Medication Instructions:  No changes at this time.   *If you need a refill on your cardiac medications before your next appointment, please call your pharmacy*   Lab Work: BMET today go over to [employer] at [facility] then go to 1st desk on the right to check in (REGISTRATION)  Lab hours: Monday- Friday (7:30 am- 5:30 pm)   If you have labs (blood work) drawn today and your tests are completely normal, you will receive your results only by: MyChart Message (if you have MyChart) OR A paper copy in the mail If you have any lab test that is abnormal or we need to change your treatment, we will call you to review the results.   Testing/Procedures: None   Follow-Up: At [facility], you and your health needs are our priority.  As part of our continuing mission to provide you with exceptional heart care, we have created designated Provider Care Teams.  These Care Teams include your primary Cardiologist (physician) and Advanced Practice Providers (APPs -  Physician Assistants and Nurse Practitioners) who all work together to provide you with the care you need, when you need it.  We recommend signing up for the patient portal called "MyChart".  Sign up information is provided on this After Visit Summary.  MyChart is used to connect with patients for Virtual Visits (Telemedicine).  Patients are able to view lab/test results, encounter notes, upcoming appointments, etc.  Non-urgent messages can be sent to your provider as well.   To learn more about what you can do with MyChart, go to [email or web address].    Your next appointment:   3 week(s)  The format for your next appointment:   In Person  Provider:   Kathlyn Sacramento, MD or Murray Hodgkins, NP       Important Information About Sugar

## 2021-07-20 ENCOUNTER — Other Ambulatory Visit: Payer: Self-pay | Admitting: *Deleted

## 2021-07-20 ENCOUNTER — Telehealth (HOSPITAL_COMMUNITY): Payer: Self-pay

## 2021-07-20 MED ORDER — TORSEMIDE 20 MG PO TABS: 60.0000 mg | ORAL_TABLET | Freq: Two times a day (BID) | ORAL | 1 refills | Status: DC

## 2021-07-20 NOTE — Telephone Encounter (Signed)
Attempted all day to contact [place] but unsuccessful.  Attempted to and spoke with wife.  He has not went to get his meds yet.   He would have ran out of spironolactone today.  Advied her the importance of getting this medication and he needs to take it to addition to what is in his med box.  She appeared to understand.  Will make a home visit with Eddie Dibbles on Tuesday.    [place] [phone]

## 2021-07-26 ENCOUNTER — Other Ambulatory Visit (HOSPITAL_COMMUNITY): Payer: Self-pay

## 2021-07-26 DIAGNOSIS — R809 Proteinuria, unspecified: Secondary | ICD-10-CM | POA: Diagnosis not present

## 2021-07-26 DIAGNOSIS — I10 Essential (primary) hypertension: Secondary | ICD-10-CM | POA: Diagnosis not present

## 2021-07-26 DIAGNOSIS — E1122 Type 2 diabetes mellitus with diabetic chronic kidney disease: Secondary | ICD-10-CM | POA: Diagnosis not present

## 2021-07-26 DIAGNOSIS — N2581 Secondary hyperparathyroidism of renal origin: Secondary | ICD-10-CM | POA: Diagnosis not present

## 2021-07-26 DIAGNOSIS — N1832 Chronic kidney disease, stage 3b: Secondary | ICD-10-CM | POA: Diagnosis not present

## 2021-07-27 NOTE — Progress Notes (Signed)
My coworker [name] made a home visit with [name].  He has not picked up his medications yet, he states will go today.  He has been out of spironolactone since last Thursday.  Filled his boxes with medication he has.  Explained the [employer] assistance program with him, gave him paper work to fill out and take to his next HF clinic appt to apply.  His wife also asked for assistance with food, gave her the paperwork for EBT for [place], she states will fill them both out.  Will follow up with them to see if they have done the paperwork and assist in any way I can.   Will check with him next week to see if he did get his medications.  He denies any problems today, they have everything for daily living.  Family close by for support.  His wife keeps up with his appts.  She is aware of up coming appts.  Will continue to visit for heart failure, diet and medication compliance.   [place] [phone]

## 2021-08-10 ENCOUNTER — Ambulatory Visit: Payer: PPO | Admitting: Family
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
## 2021-08-14 ENCOUNTER — Ambulatory Visit: Payer: PPO | Admitting: Family

## 2021-08-16 ENCOUNTER — Inpatient Hospital Stay: Payer: PPO | Attending: Oncology

## 2021-08-16 DIAGNOSIS — E538 Deficiency of other specified B group vitamins: Secondary | ICD-10-CM | POA: Insufficient documentation

## 2021-08-16 DIAGNOSIS — Z85038 Personal history of other malignant neoplasm of large intestine: Secondary | ICD-10-CM | POA: Insufficient documentation

## 2021-08-16 MED ORDER — CYANOCOBALAMIN 1000 MCG/ML IJ SOLN
1000.0000 ug | INTRAMUSCULAR | Status: DC
  Administered 2021-08-16: 1000 ug via INTRAMUSCULAR
  Filled 2021-08-16: qty 1

## 2021-08-21 ENCOUNTER — Encounter: Payer: Self-pay | Admitting: Nurse Practitioner

## 2021-08-21 ENCOUNTER — Ambulatory Visit (INDEPENDENT_AMBULATORY_CARE_PROVIDER_SITE_OTHER): Payer: PPO | Admitting: Nurse Practitioner

## 2021-08-21 ENCOUNTER — Other Ambulatory Visit
Admission: RE | Admit: 2021-08-21 | Discharge: 2021-08-21 | Disposition: A | Discharge status: Home / Self Care | Payer: PPO | Source: Ambulatory Visit | Attending: Nurse Practitioner | Admitting: Nurse Practitioner

## 2021-08-21 VITALS — BP 90/40 | HR 63 | Ht 69.0 in | Wt 155.4 lb

## 2021-08-21 DIAGNOSIS — I2510 Atherosclerotic heart disease of native coronary artery without angina pectoris: Secondary | ICD-10-CM | POA: Diagnosis not present

## 2021-08-21 DIAGNOSIS — I5022 Chronic systolic (congestive) heart failure: Secondary | ICD-10-CM

## 2021-08-21 DIAGNOSIS — N183 Chronic kidney disease, stage 3 unspecified: Secondary | ICD-10-CM | POA: Diagnosis not present

## 2021-08-21 DIAGNOSIS — I429 Cardiomyopathy, unspecified: Secondary | ICD-10-CM | POA: Diagnosis not present

## 2021-08-21 DIAGNOSIS — I10 Essential (primary) hypertension: Secondary | ICD-10-CM | POA: Insufficient documentation

## 2021-08-21 DIAGNOSIS — E785 Hyperlipidemia, unspecified: Secondary | ICD-10-CM

## 2021-08-21 DIAGNOSIS — I4819 Other persistent atrial fibrillation: Secondary | ICD-10-CM | POA: Diagnosis not present

## 2021-08-21 DIAGNOSIS — R9431 Abnormal electrocardiogram [ECG] [EKG]: Secondary | ICD-10-CM

## 2021-08-21 DIAGNOSIS — I952 Hypotension due to drugs: Secondary | ICD-10-CM | POA: Diagnosis not present

## 2021-08-21 LAB — BASIC METABOLIC PANEL
Anion gap: 5 (ref 5–15)
BUN: 38 mg/dL — ABNORMAL HIGH (ref 8–23)
CO2: 28 mmol/L (ref 22–32)
Calcium: 9 mg/dL (ref 8.9–10.3)
Chloride: 110 mmol/L (ref 98–111)
Creatinine, Ser: 1.76 mg/dL — ABNORMAL HIGH (ref 0.61–1.24)
GFR, Estimated: 40 mL/min — ABNORMAL LOW (ref >60)
Glucose, Bld: 80 mg/dL (ref 70–99)
Potassium: 4.1 mmol/L (ref 3.5–5.1)
Sodium: 143 mmol/L (ref 135–145)

## 2021-08-21 LAB — MAGNESIUM: Magnesium: 2.2 mg/dL (ref 1.7–2.4)

## 2021-08-21 NOTE — Patient Instructions (Signed)
Medication Instructions:  Your physician has recommended you make the following change in your medication:   STOP Amlodipine  *If you need a refill on your cardiac medications before your next appointment, please call your pharmacy*   Lab Work: [facility] today  Please have your lab drawn at the [facility]. Stop Registration to desk to check in.  If you have labs (blood work) drawn today and your tests are completely normal, you will receive your results only by: [place] (if you have MyChart) OR A paper copy in the mail If you have any lab test that is abnormal or we need to change your treatment, we will call you to review the results.   Testing/Procedures: None ordered   Follow-Up: At [facility], you and your health needs are our priority.  As part of our continuing mission to provide you with exceptional heart care, we have created designated Provider Care Teams.  These Care Teams include your primary Cardiologist (physician) and Advanced Practice Providers (APPs -  Physician Assistants and Nurse Practitioners) who all work together to provide you with the care you need, when you need it.  We recommend signing up for the patient portal called "MyChart".  Sign up information is provided on this After Visit Summary.  MyChart is used to connect with patients for Virtual Visits (Telemedicine).  Patients are able to view lab/test results, encounter notes, upcoming appointments, etc.  Non-urgent messages can be sent to your provider as well.   To learn more about what you can do with MyChart, go to [email or web address].    Your next appointment:   6-8 week(s)  The format for your next appointment:   In Person  Provider:   You may see Kathlyn Sacramento, MD or one of the following Advanced Practice Providers on your designated Care Team:   Murray Hodgkins, NP Christell Faith, PA-C [place] Kathlen Mody, [place]   Other Instructions N/A  Important Information About  [place]

## 2021-08-21 NOTE — Progress Notes (Signed)
Office Visit    Patient Name: [name]. Date of Encounter: 08/21/2021  Primary Care Provider:  Leone Haven, MD Primary Cardiologist:  Kathlyn Sacramento, MD  Chief Complaint    76 year old male with a history of CAD status post circumflex stenting, chronic HFrEF, mixed ischemic and nonischemic cardiomyopathy, hypertension, hyperlipidemia, stage III chronic kidney disease, and colon cancer, who presents for heart failure follow-up.  Past Medical History    Past Medical History:  Diagnosis Date   Adenocarcinoma of colon ([place]) 06/19/2013   a. moderately differentiated stage IIIc (T4bN2a M0) adenocarcinoma of colon  s/p colectomy followed by chemoRx with oxaliplatin & fluorouacil.   Anemia    CAD (coronary artery disease)    a. 02/2015 Cath: LM nl, LAD 50/40 mid/distal, LCX 80p, RCA 40p, 30d, RPL1 40, CO 3.27, CI 1.65; b. 06/09/15 PCI: LM minor irregs, m-dLAD 50%, dLAD 40%, pLCx 80% (s/p PCI/DES 0%), pRCA 40%, dRCA 30%, 1st RPLB 40%   Chronic kidney disease, stage 3a (HCC)    Chronic systolic CHF (congestive heart failure) ([place])    a. 02/2015 Echo: EF 20-25%, diff HK, mod MR, mildy dil LA, mildly dil RV w/ mod RV syst dysfxn, mildly dil RA, mod TR, PASP 9mHg; b. 09/2017 Echo: EF 40-45%, diff HK. Gr1 DD. Mild to mod MR. Mildly to mod dil LA; c. 09/2019 Echo: EF 25-30%, glob HK, mod reduced RV fxn, sev elev PASP. Mod dil LA. Mild-mod MR; d. 05/2021 Echo: EF 20-25%, glob HK, sev red RV fxn, sev BAE, sev MR/TR/TS, triv AI.   Diabetes mellitus without complication ([place]    Essential hypertension    Hypertensive heart disease    Left inguinal hernia 05/2020   Mixed Ischemic and Non-ischemic Cardiomyopathy    a. 02/2015 EF 20-25%, diff HK; b. 09/2016 EF 40-45%; c. 09/2019 EF 25-30%; d. 05/2021 Echo: EF 20-25%, glob HK.   Persistent atrial fibrillation ([place]    a. Dx 05/2021-->CHA2DS2VASc = 5-->eliquis.   Pulmonary hypertension (HCC)    Severe mitral regurgitation    a. 09/2017 Echo:  Mild to mod MR; b. 09/2019 Echo: mild-mod MR; c. 05/2021 Echo: Sev MR/TR/TS.   Tubular adenoma of colon    Past Surgical History:  Procedure Laterality Date   APPENDECTOMY N/A 06/19/2017   [facility] [facility] [place] Surgeon: WConsuela Mimes MD   CARDIAC CATHETERIZATION Bilateral 02/24/2015   Procedure: Right/Left Heart Cath and Coronary Angiography;  Surgeon: MWellington Hampshire MD;  [facility];  Service: Cardiovascular;  Laterality: Bilateral;   CARDIAC CATHETERIZATION N/A 06/09/2015   Procedure: Coronary Stent Intervention (3.0 x 12 mm Xience Alpine DES to LCx);  Surgeon: MWellington Hampshire MD;  [facility];  Service: Cardiovascular;  Laterality: N/A   COLECTOMY  06/19/2017   Descending and proximal sigmoid colectomy, LEFT ureterolysis, partial cecectomy, appendectomy; [facility] Surgeon: WConsuela Mimes MD   COLONOSCOPY     COLONOSCOPY WITH PROPOFOL N/A 12/11/2016   Procedure: COLONOSCOPY WITH PROPOFOL;  Surgeon: SLollie Sails MD;  [facility];  Service: Endoscopy;  Laterality: N/A;   ESOPHAGOGASTRODUODENOSCOPY     PORT-A-CATH REMOVAL N/A 07/12/2014   Procedure: REMOVAL PORT-A-CATH;  Surgeon: WMolly Maduro MD;  Location: [facility] ORS;  Service: General;  Laterality: N/A;   PORTACATH PLACEMENT Left 2015    Allergies  Allergies  Allergen Reactions   Shellfish Allergy Other (See Comments) and Hives    Pt. instructed by MD to avoid seafood  History of Present Illness    76 year old male with above complex past medical history including CAD status post circumflex stenting, chronic HFrEF, mixed ischemic and nonischemic cardiomyopathy, hypertension, hyperlipidemia, stage III chronic kidney disease, and colon cancer.  In the setting of colon cancer, he underwent colectomy 2015, followed by chemotherapy with oxaliplatin and fluorouracil.  He subsequently developed neuropathy.    In January 2017, he was diagnosed with systolic heart  failure when echocardiogram showed an EF of 20 to 25% with moderate mitral regurgitation/tricuspid regurgitation, and moderate to severe pulmonary hypertension.  Right and left heart catheterization showed moderate to severe pulmonary hypertension with severely elevated filling pressures and severely reduced cardiac output.  Coronary angiography showed severe one-vessel CAD with an 80 to 90% stenosis in the proximal left circumflex, and mild diffuse disease affecting the LAD and RCA.  Following initiation of heart failure medications and adequate diuresis, he subsequently went staged PCI of the left circumflex in May 2017.  Follow-up echo in November 2020, showed EF of 30 to 35%.  In the setting of PVCs, he underwent outpatient monitoring at that time, which showed a 6% PVC burden, which improved with resumption of carvedilol.  Echocardiogram in April 2021 showed persistent LV dysfunction with an EF of 25 to 30%, severe pulmonary hypertension, and mild to moderate mitral regurgitation.  He has been offered referral to EP for consideration of ICD therapy in the past however, he declined.  He required admission in late April 2023 in the setting of massive volume overload and weight gain.  Echo during admission showed an EF of 20 to 25% with severely dilated left ventricle, severely reduced RV function, severe biatrial enlargement, severe mitral and tricuspid regurgitation, circumferential pericardial effusion, and trivial AI.  He was diuresed down from 81.2 kg of admission to 68.85 kg (151 pounds) on discharge.    Cardiology follow-up June 7, weight had climbed back up to 173 pounds in the setting of some noncompliance with torsemide.  He was markedly volume overloaded on exam and declined readmission.  Torsemide dose was adjusted and at most recent follow-up on June 14, his weight was down 17 pounds with ongoing lower extremity woody edema but overall significant improvement.  He had run out of [place] and did not  think he could afford to go back on it.  Since his last visit however, he says he is now back on Entresto.  Per community paramedic note on June 21, he had yet to pick up his medications at the pharmacy though he now says that he has done this.  I reviewed his list with him in detail today, and he reports compliance with all medications.  Clinically, he has done well.  He says he is able to push mow his yard now which is a significant improvement in his activity tolerance.  He continues to have mild, lower extremity woody edema which he notes is stable.  He denies chest pain, dyspnea, palpitations, PND, orthopnea, dizziness, syncope, or early satiety.  Home Medications    Current Outpatient Medications  Medication Sig Dispense Refill   apixaban (ELIQUIS) 5 MG TABS tablet TAKE 1 TABLET(5 MG) BY MOUTH TWICE DAILY 60 tablet 5   atorvastatin (LIPITOR) 40 MG tablet Take 1 tablet (40 mg total) by mouth daily. 90 tablet 3   carvedilol (COREG) 3.125 MG tablet Take 1 tablet (3.125 mg total) by mouth 2 (two) times daily with a meal. 180 tablet 3   cholecalciferol (VITAMIN D3) 25 MCG (1000 UNIT)  tablet Take 1,000 Units by mouth daily.     docusate sodium (COLACE) 100 MG capsule Take 1 capsule (100 mg total) by mouth daily as needed for mild constipation. 30 capsule 5   ferrous sulfate 325 (65 FE) MG tablet Take 325 mg by mouth daily with breakfast.     gabapentin (NEURONTIN) 100 MG capsule Take 100 mg by mouth 3 (three) times daily. Taking 2 -100 mg pills in am and 1 -100 mg at night     Lidocaine 4 % GEL Apply 1 application. topically 4 (four) times daily as needed. 113 g 0   loratadine (CLARITIN) 10 MG tablet Take 1 tablet (10 mg total) by mouth daily. 30 tablet 11   potassium chloride SA (KLOR-CON M20) 20 MEQ tablet Take 2 tablets (40 mEq total) by mouth daily. 60 tablet 6   sacubitril-valsartan (ENTRESTO) 24-26 MG Take 1 tablet by mouth 2 (two) times daily. 180 tablet 3   spironolactone (ALDACTONE) 25 MG  tablet Take 1 tablet (25 mg total) by mouth daily. 30 tablet 0   torsemide (DEMADEX) 20 MG tablet Take 3 tablets (60 mg total) by mouth 2 (two) times daily. Take 4 tablets (80 mg) twice a day for 3 days THEN take 3 tablets (60 mg) twice a day 180 tablet 1   No current facility-administered medications for this visit.     Review of Systems    Overall doing well and reports compliance with medications.  Improved activity tolerance and mowing his lawn.  He denies chest pain, dyspnea, palpitations, PND, orthopnea, dizziness, syncope, or early satiety.  Continues to have mild lower extremity swelling.  All other systems reviewed and are otherwise negative except as noted above.    Physical Exam    VS:  BP (!) 90/40 (BP Location: Left Arm, Patient Position: Sitting, Cuff Size: Normal)   Pulse 63   Ht '5\' 9"'$  (1.753 m)   Wt 155 lb 6 oz (70.5 kg)   SpO2 97%   BMI 22.94 kg/m  , BMI Body mass index is 22.94 kg/m.     GEN: Well nourished, well developed, in no acute distress. HEENT: normal. Neck: Supple, no JVD, carotid bruits, or masses. Cardiac: RRR, distant heart sounds.  Do not appreciate any murmurs.  No rubs or gallops. No clubbing, cyanosis, trace bilateral lower extremity woody edema.  Radials 2+/PT 1+ and equal bilaterally.  Respiratory:  Respirations regular and unlabored, clear to auscultation bilaterally. GI: Soft, nontender, nondistended, BS + x 4. MS: no deformity or atrophy. Skin: warm and dry, no rash. Neuro:  Strength and sensation are intact. Psych: Normal affect.  Accessory Clinical Findings    ECG personally reviewed by me today -regular sinus rhythm, 63, PACs, inferior infarct, lateral ST depression with T wave inversion, prolonged QT- no acute changes.  Lab Results  Component Value Date   WBC 4.2 06/01/2021   HGB 11.7 (L) 06/01/2021   HCT 36.6 (L) 06/01/2021   MCV 102.8 (H) 06/01/2021   PLT 95 (L) 06/01/2021   Lab Results  Component Value Date   CREATININE 1.65  (H) 07/19/2021   BUN 29 (H) 07/19/2021   NA 140 07/19/2021   K 4.1 07/19/2021   CL 103 07/19/2021   CO2 32 07/19/2021   Lab Results  Component Value Date   ALT 53 (H) 05/29/2021   AST 55 (H) 05/29/2021   ALKPHOS 102 05/29/2021   BILITOT 3.2 (H) 05/29/2021   Lab Results  Component Value Date  CHOL 113 03/10/2021   HDL 45.20 03/10/2021   LDLCALC 56 03/10/2021   TRIG 58.0 03/10/2021   CHOLHDL 3 03/10/2021    Lab Results  Component Value Date   HGBA1C 6.5 03/10/2021    Assessment & Plan    1.  Chronic heart failure with reduced ejection fraction/mixed ischemic and nonischemic cardiomyopathy: Hospitalized in April with volume overload.  Echo showed EF of 20 to 25% with severe biventricular failure and severe mitral and tricuspid regurgitation, and tricuspid stenosis.  He was aggressively diuresed down to 151 pounds and after some weight gain and early June in the setting of noncompliance with his torsemide, his weight has since been stabilized around 150 pounds on his home scale.  He has been feeling well and with the exception of mild lower extremity woody edema, he appears euvolemic with clear lungs and no JVD.  I reviewed his medicine list in detail with him today his compliance has been an issue in the past.  He confirms for me that he is taking carvedilol, Entresto, spironolactone, and torsemide.  He is also on amlodipine in the setting of a blood pressure of 90/40, I am going to discontinue this.  Follow-up basic metabolic panel today.  As previously noted, SGLT2 inhibitor therapy is likely cost prohibitive.  2.  Persistent atrial fibrillation: This was diagnosed at visit in April 2023.  Today, he is in sinus rhythm with PACs.  Continue beta-blocker and Eliquis therapy.  3.  Coronary artery disease: Status post circumflex stenting in 2017.  He has not been having any chest pain.  Remains on beta-blocker and statin therapy.  No aspirin in the setting of Eliquis.  4.  Essential  hypertension/hypotension: Blood pressure has been soft in the setting of GDMT for heart failure.  Stopping amlodipine today.  5.  Hyperlipidemia: LDL of 56 in February.  He had mild elevations of AST and ALT in April at 53 respectively.  Continue statin therapy.  6.  Stage III chronic kidney disease: Follow-up basic metabolic panel today in the setting of ongoing diuretic therapy.  7.  Hypokalemia: Potassium stable at 4.1 on last check in June.  Follow-up today, especially in the setting of prolonged QT.  8.  Prolonged QT: QT of 520 with QTc of 532.  Follow-up basic metabolic panel magnesium today.  He is not on any commonly known to prolong QT.  9.  Disposition: Follow-up basic metabolic panel and magnesium today.  Follow-up in clinic in 6 weeks or sooner if necessary.  Murray Hodgkins, NP 08/21/2021, 3:00 PM

## 2021-08-22 NOTE — Progress Notes (Unsigned)
02/13/2018 2:14 PM   [name] 27-Mar-1945 [id number]  Referring provider: Leone Haven, [name] [address] [place],  [place] [address]  Urological history: 1. High risk hematuria -non-smoker -CTU 2019 no definite source for hematuria identified on today's examination. Specifically, no urinary tract calculi and no findings of urinary tract obstruction. In addition to multiple simple cysts there are several subcentimeter low-attenuation lesions in the kidneys bilaterally. These are too small to definitively characterize, but are favored to represent small cysts -cysto 2019 enlarged prostate -no reports of gross heme -UA 3-10 RBC's  2. Renal cysts -Non-contrast CT on 08/07/2018 Simple 3.1 cm posterior lower right renal cyst.  Simple 1.5 cm posterior lower left renal cyst  3. BPH with LU TS -I PSS 14/2  4. Prostate nodule - 5 mm x 5 mm nodule  -incidential finding on 08/2019 exam -patient deferred further work up   Chief Complaint  Patient presents with   Follow-up     HPI: Christopher Owenis a 76 y.o. male who presents today for one year follow up.    Non contrast CT, 05/2021 - bilateral renal cysts and bilateral hydroceles   He has no urinary complaints at this visit.  Patient denies any modifying or aggravating factors.  Patient denies any gross hematuria, dysuria or suprapubic/flank pain.  Patient denies any fevers, chills, nausea or vomiting.    UA 3-10 RBC's.     IPSS     Row Name 08/23/21 1300         International Prostate Symptom Score   How often have you had the sensation of not emptying your bladder? Almost always     How often have you had to urinate less than every two hours? More than half the time     How often have you found you stopped and started again several times when you urinated? Less than 1 in 5 times     How often have you found it difficult to postpone urination? Not at All     How often have you had a weak urinary  stream? Less than 1 in 5 times     How often have you had to strain to start urination? Not at All     How many times did you typically get up at night to urinate? 3 Times     Total IPSS Score 14       Quality of Life due to urinary symptoms   If you were to spend the rest of your life with your urinary condition just the way it is now how would you feel about that? Mostly Satisfied                Score:  1-7 Mild 8-19 Moderate 20-35 Severe  PMH: Past Medical History:  Diagnosis Date   Adenocarcinoma of colon ([place]) 06/19/2013   a. moderately differentiated stage IIIc (T4bN2a M0) adenocarcinoma of colon  s/p colectomy followed by chemoRx with oxaliplatin & fluorouacil.   Anemia    CAD (coronary artery disease)    a. 02/2015 Cath: LM nl, LAD 50/40 mid/distal, LCX 80p, RCA 40p, 30d, RPL1 40, CO 3.27, CI 1.65; b. 06/09/15 PCI: LM minor irregs, Christopher-dLAD 50%, dLAD 40%, pLCx 80% (s/p PCI/DES 0%), pRCA 40%, dRCA 30%, 1st RPLB 40%   Chronic kidney disease, stage 3a (HCC)    Chronic systolic CHF (congestive heart failure) ([place])    a. 02/2015 Echo: EF 20-25%, diff HK, mod  MR, mildy dil LA, mildly dil RV w/ mod RV syst dysfxn, mildly dil RA, mod TR, PASP 10mHg; b. 09/2017 Echo: EF 40-45%, diff HK. Gr1 DD. Mild to mod MR. Mildly to mod dil LA; c. 09/2019 Echo: EF 25-30%, glob HK, mod reduced RV fxn, sev elev PASP. Mod dil LA. Mild-mod MR; d. 05/2021 Echo: EF 20-25%, glob HK, sev red RV fxn, sev BAE, sev MR/TR/TS, triv AI.   Diabetes mellitus without complication ([facility]    Essential hypertension    Hypertensive heart disease    Left inguinal hernia 05/2020   Mixed Ischemic and Non-ischemic Cardiomyopathy    a. 02/2015 EF 20-25%, diff HK; b. 09/2016 EF 40-45%; c. 09/2019 EF 25-30%; d. 05/2021 Echo: EF 20-25%, glob HK.   Persistent atrial fibrillation ([place]    a. Dx 05/2021-->CHA2DS2VASc = 5-->eliquis.   Pulmonary hypertension (HCC)    Severe mitral regurgitation    a. 09/2017 Echo: Mild to mod MR; b.  09/2019 Echo: mild-mod MR; c. 05/2021 Echo: Sev MR/TR/TS.   Tubular adenoma of colon     Surgical History: Past Surgical History:  Procedure Laterality Date   APPENDECTOMY N/A 06/19/2017   Location: [facility]; Surgeon: [name]   CARDIAC CATHETERIZATION Bilateral 02/24/2015   Procedure: Right/Left Heart Cath and Coronary Angiography;  Surgeon: [name];  [facility];  Service: Cardiovascular;  Laterality: Bilateral;   CARDIAC CATHETERIZATION N/A 06/09/2015   Procedure: Coronary Stent Intervention (3.0 x 12 mm Xience Alpine DES to LCx);  Surgeon: [name];  [facility];  Service: Cardiovascular;  Laterality: N/A   COLECTOMY  06/19/2017   Descending and proximal sigmoid colectomy, LEFT ureterolysis, partial cecectomy, appendectomy; [facility] [facility]geon: [name]   COLONOSCOPY     COLONOSCOPY WITH PROPOFOL N/A 12/11/2016   Procedure: COLONOSCOPY WITH PROPOFOL;  Surgeon: Christopher Sails [name];  [facility];  Service: Endoscopy;  Laterality: N/A;   ESOPHAGOGASTRODUODENOSCOPY     PORT-A-CATH REMOVAL N/A 07/12/2014   Procedure: REMOVAL PORT-A-CATH;  Surgeon: WMolly Maduro [name];  Location: [facility];  Service: General;  Laterality: N/A;   PORTACATH PLACEMENT Left 2015    Home Medications:  Allergies as of 08/23/2021       Reactions   Shellfish Allergy Other (See Comments), Hives   Pt. instructed by [name] to avoid seafood        Medication List        Accurate as of August 23, 2021  2:14 PM. If you have any questions, ask your nurse or doctor.          atorvastatin 40 MG tablet Commonly known as: LIPITOR Take 1 tablet (40 mg total) by mouth daily.   carvedilol 3.125 MG tablet Commonly known as: COREG Take 1 tablet (3.125 mg total) by mouth 2 (two) times daily with a meal.   cholecalciferol 25 MCG (1000 UNIT) tablet Commonly known as: VITAMIN D3 Take 1,000 Units by mouth  daily.   docusate sodium 100 MG capsule Commonly known as: Colace Take 1 capsule (100 mg total) by mouth daily as needed for mild constipation.   Eliquis 5 MG Tabs tablet Generic drug: apixaban TAKE 1 TABLET(5 MG) BY MOUTH TWICE DAILY   Entresto 24-26 MG Generic drug: sacubitril-valsartan Take 1 tablet by mouth 2 (two) times daily.   ferrous sulfate 325 (65 FE) MG tablet Take 325 mg by mouth daily with breakfast.   gabapentin 100 MG capsule Commonly known  as: NEURONTIN Take 100 mg by mouth 3 (three) times daily. Taking 2 -100 mg pills in am and 1 -100 mg at night   Lidocaine 4 % Gel Apply 1 application. topically 4 (four) times daily as needed.   loratadine 10 MG tablet Commonly known as: CLARITIN Take 1 tablet (10 mg total) by mouth daily.   potassium chloride SA 20 MEQ tablet Commonly known as: [place]-Con M20 Take 2 tablets (40 mEq total) by mouth daily.   spironolactone 25 MG tablet Commonly known as: ALDACTONE Take 1 tablet (25 mg total) by mouth daily.   torsemide 20 MG tablet Commonly known as: DEMADEX Take 3 tablets (60 mg total) by mouth 2 (two) times daily. Take 4 tablets (80 mg) twice a day for 3 days THEN take 3 tablets (60 mg) twice a day        Allergies:  Allergies  Allergen Reactions   Shellfish Allergy Other (See Comments) and Hives    Pt. instructed by [name] to avoid seafood    Family History: Family History  Problem Relation Age of Onset   Cancer Mother     Social History:  reports that he has never smoked. He has never used smokeless tobacco. He reports that he does not currently use alcohol. He reports that he does not use drugs.  ROS: For pertinent review of systems please refer to history of present illness  Physical Exam: BP 129/60   Pulse (!) 57   Ht '5\' 9"'$  (1.753 Christopher)   Wt 156 lb (70.8 kg)   BMI 23.04 kg/Christopher   Constitutional:  Well nourished. Alert and oriented, No acute distress. HEENT: [place] AT, moist mucus membranes.  Trachea  midline Cardiovascular: No clubbing, cyanosis, or edema. Respiratory: Normal respiratory effort, no increased work of breathing. GU: No CVA tenderness.  No bladder fullness or masses.  Patient with uncircumcised phallus.  Severe phimosis.  I was unable to retract the foreskin.  He had some vitiligo at the tip of the foreskin.  Urethral meatus is patent.  No penile discharge. No penile lesions or rashes. Scrotum without lesions, cysts, rashes and/or edema.  Testicles are located scrotally bilaterally. No masses are appreciated in the testicles. Left and right epididymis are normal.  Postoperative hydrocele still present in the left inguinal area and left hemiscrotum.  Not painful to patient. Rectal: Patient with  normal sphincter tone. Anus and perineum without scarring or rashes. No rectal masses are appreciated. Prostate is approximately 45 grams, 5 mm x 5 mm nodule along the right lobe ridge, unchanged.  Seminal vesicles could not be palpated.  Neurologic: Grossly intact, no focal deficits, moving all 4 extremities. Psychiatric: Normal mood and affect.   Laboratory Data:    Latest Ref Rng & Units 08/21/2021    3:18 PM 07/19/2021   12:02 PM 07/12/2021    3:32 PM  BMP  Glucose 70 - 99 mg/dL 80  134  108   BUN 8 - 23 mg/dL 38  29  31   Creatinine 0.61 - 1.24 mg/dL 1.76  1.65  1.65   Sodium 135 - 145 mmol/L 143  140  146   Potassium 3.5 - 5.1 mmol/L 4.1  4.1  3.0   Chloride 98 - 111 mmol/L 110  103  114   CO2 22 - 32 mmol/L 28  32  26   Calcium 8.9 - 10.3 mg/dL 9.0  8.8  8.8        Latest Ref Rng & Units 06/01/2021  6:18 AM 05/31/2021    5:24 AM 05/30/2021    4:38 AM  CBC  WBC 4.0 - 10.5 K/uL 4.2  4.8  6.2   Hemoglobin 13.0 - 17.0 g/dL 11.7  11.0  12.2   Hematocrit 39.0 - 52.0 % 36.6  33.9  38.8   Platelets 150 - 400 K/uL 95  81  89     Urinalysis See HPI and see Epic.  I have reviewed the labs.  Pertinent Imaging: CLINICAL DATA:  Follow-up colon carcinoma. Previous surgery  and chemotherapy. Splenic lesions noted on prior CT.   EXAM: CT ABDOMEN AND PELVIS WITHOUT CONTRAST   TECHNIQUE: Multidetector CT imaging of the abdomen and pelvis was performed following the standard protocol without IV contrast.   RADIATION DOSE REDUCTION: This exam was performed according to the departmental dose-optimization program which includes automated exposure control, adjustment of the mA and/or kV according to patient size and/or use of iterative reconstruction technique.   COMPARISON:  09/20/2020   FINDINGS: Lower chest: Moderate cardiomegaly is increased since previous study. Small to moderate bilateral pleural effusions are new, with mild atelectasis in both lung bases.   Hepatobiliary: No mass visualized on this unenhanced exam. Gallbladder is unremarkable. No evidence of biliary ductal dilatation.   Pancreas: No mass or inflammatory process visualized on this unenhanced exam.   Spleen: Within normal limits in size. Previously seen small low-attenuation lesions are not visualized on this unenhanced exam.   Adrenals/Urinary tract: No evidence of urolithiasis or hydronephrosis. Small fluid attenuation cysts again noted in both kidneys. (No followup imaging is recommended.) Unremarkable unopacified urinary bladder.   Stomach/Bowel: No evidence of obstruction, inflammatory process, or abnormal fluid collections.   Vascular/Lymphatic: No pathologically enlarged lymph nodes identified. No evidence of abdominal aortic aneurysm. Aortic atherosclerotic calcification noted.   Reproductive:  No mass or other significant abnormality.   Other: New diffuse mesenteric and body wall edema noted. Small amount of ascites seen within the pelvis. Well-circumscribed fluid collection with thin peripheral rim is again seen in the left inguinal canal, which measures 5.2 x 4.8 cm, is mildly decreased in size since previous study.   Musculoskeletal:  No suspicious bone  lesions identified.   IMPRESSION: New anasarca.   No other acute findings identified. No evidence of recurrent or metastatic carcinoma.   Mild decrease in size of 5.2 cm fluid collection in left inguinal canal.     Electronically Signed   By: Marlaine Hind Christopher.D.   On: 05/11/2021 13:08  I have independently reviewed the films.  See HPI.    Assessment & Plan:    1. History of hematuria (high risk) -Hematuria work up completed in 09/2017- findings positive for renal cysts and enlarged prostate -No report of gross hematuria  -UA 3-10 RBC's -patient with severe phimosis and unable to retracted past the glans or even revealed the meatus -Explained the microscopic hematuria may be from the irritation of the glans being covered by the foreskin and the severe phimosis may make it difficult to complete cystoscopy and in light with his other co morbidities I will further discuss with Dr. Bernardo Heater on how to proceed  2. BPH with LUTS -Continue conservative management, avoiding bladder irritants and timed voiding's  3. Prostate nodule -stable   Return in about 1 year (around 08/24/2022) for IPSS, PVR and exam.  These notes generated with voice recognition software. I apologize for typographical errors.  Dale Hidalgo [address]  [place], [place] [address] [phone]  [place], PA-C

## 2021-08-23 ENCOUNTER — Encounter: Payer: Self-pay | Admitting: Urology

## 2021-08-23 ENCOUNTER — Ambulatory Visit: Payer: PPO | Admitting: Urology

## 2021-08-23 VITALS — BP 129/60 | HR 57 | Ht 69.0 in | Wt 156.0 lb

## 2021-08-23 DIAGNOSIS — R319 Hematuria, unspecified: Secondary | ICD-10-CM

## 2021-08-23 DIAGNOSIS — N138 Other obstructive and reflux uropathy: Secondary | ICD-10-CM

## 2021-08-23 DIAGNOSIS — N402 Nodular prostate without lower urinary tract symptoms: Secondary | ICD-10-CM | POA: Diagnosis not present

## 2021-08-23 DIAGNOSIS — N401 Enlarged prostate with lower urinary tract symptoms: Secondary | ICD-10-CM

## 2021-08-23 LAB — URINALYSIS, COMPLETE
Bilirubin, UA: NEGATIVE
Glucose, UA: NEGATIVE
Ketones, UA: NEGATIVE
Leukocytes,UA: NEGATIVE
Nitrite, UA: NEGATIVE
Protein,UA: NEGATIVE
Specific Gravity, UA: 1.01 (ref 1.005–1.030)
Urobilinogen, Ur: 1 mg/dL (ref 0.2–1.0)
pH, UA: 6.5 (ref 5.0–7.5)

## 2021-08-23 LAB — MICROSCOPIC EXAMINATION

## 2021-08-28 ENCOUNTER — Encounter (HOSPITAL_COMMUNITY): Payer: Self-pay

## 2021-08-28 NOTE — Progress Notes (Signed)
Have attempted to contact several times to remind importance of visit with HF clinic tomorrow.  There is no answer and no one returns my call.  He is having trouble affording his medications and was bringing proof of income with him to this appt.    [place] [phone]

## 2021-08-29 ENCOUNTER — Other Ambulatory Visit (HOSPITAL_COMMUNITY): Payer: Self-pay

## 2021-08-29 ENCOUNTER — Ambulatory Visit: Payer: PPO | Attending: Family | Admitting: Family

## 2021-08-29 ENCOUNTER — Encounter: Payer: Self-pay | Admitting: Family

## 2021-08-29 ENCOUNTER — Other Ambulatory Visit: Payer: Self-pay | Admitting: Cardiovascular Disease

## 2021-08-29 VITALS — BP 109/47 | HR 56 | Resp 16 | Ht 69.0 in | Wt 154.0 lb

## 2021-08-29 DIAGNOSIS — Z85038 Personal history of other malignant neoplasm of large intestine: Secondary | ICD-10-CM | POA: Diagnosis not present

## 2021-08-29 DIAGNOSIS — N1832 Chronic kidney disease, stage 3b: Secondary | ICD-10-CM | POA: Diagnosis not present

## 2021-08-29 DIAGNOSIS — E1140 Type 2 diabetes mellitus with diabetic neuropathy, unspecified: Secondary | ICD-10-CM | POA: Diagnosis not present

## 2021-08-29 DIAGNOSIS — I2510 Atherosclerotic heart disease of native coronary artery without angina pectoris: Secondary | ICD-10-CM | POA: Insufficient documentation

## 2021-08-29 DIAGNOSIS — N1831 Chronic kidney disease, stage 3a: Secondary | ICD-10-CM | POA: Diagnosis not present

## 2021-08-29 DIAGNOSIS — I5022 Chronic systolic (congestive) heart failure: Secondary | ICD-10-CM | POA: Diagnosis not present

## 2021-08-29 DIAGNOSIS — I130 Hypertensive heart and chronic kidney disease with heart failure and stage 1 through stage 4 chronic kidney disease, or unspecified chronic kidney disease: Secondary | ICD-10-CM | POA: Diagnosis not present

## 2021-08-29 DIAGNOSIS — E1122 Type 2 diabetes mellitus with diabetic chronic kidney disease: Secondary | ICD-10-CM | POA: Insufficient documentation

## 2021-08-29 DIAGNOSIS — I10 Essential (primary) hypertension: Secondary | ICD-10-CM | POA: Diagnosis not present

## 2021-08-29 NOTE — Progress Notes (Signed)
Met with [name] at his HF clinic appt.  We discussed his pill boxes and medications.  He states the prices have come down to affordable now.  He has all his medications and made an appt to visit with him at home next Wednesday.  He states should be home.  He looks good and been mowing his yard.  He denies any problems.  Otila Kluver advised he needs to prop his legs up this evening and wear his compression hose during the day.  He has some edema.  He has been watching what he eats and drinks.  He has everything for daily living.  Lives with wife.  He advised no home phone to call his wife.  She is off on Mondays and works the other days.  Will continue to try to visit for heart failure, diet and medication management.   [place] [phone]

## 2021-08-29 NOTE — Patient Instructions (Signed)
Continue weighing daily and call for an overnight weight gain of 3 pounds or more or a weekly weight gain of more than 5 pounds.   If you have voicemail, please make sure your mailbox is cleaned out so that we may leave a message and please make sure to listen to any voicemails.     

## 2021-08-29 NOTE — Progress Notes (Signed)
Patient ID: Christopher Schaffert., male    DOB: 02-Feb-1946, 76 y.o.   MRN: [id number]  HPI  [name]. Is a 76 yo male patient with a PMHx of hypertension, CHF, CAD, diabetes, diabetic neuropathy, colon cancer, and  CKD IIIa.   Echo report from 05/30/21 reviewed and showed an EF of 20-25% along with severe LAE/RAE and severe MR and severe TS. Echo report from 09/09/19 reviewed and showed an EF of 25-30% with severely decreased LV function and global hypokinesis  Admitted 05/29/21 due to weakness/edema due to acute on chronic heart failure. Initially given IV lasix with transition to oral diuretics with net loss of ~ 11 liters. Cardiology consult obtained. OT/PT evaluations done. Discharged after 5 days. Presented to the ED on 04/14/21 with concerns of shingles and worsening SOB. BNP > 4,500, CXR noted with pleural effusions. Diuresed with IV lasix and planned to admit, however patient ultimately refused admission and decided to go home.  He presents today for a follow-up visit with a chief complaint of minimal fatigue with moderate exertion. Describes this as chronic in nature. He has associated difficulty sleeping, right shoulder pain and chronic neuropathy in legs along with this. He denies any dizziness, abdominal distention, palpitations, pedal edema, chest pain, shortness of breath, cough or weight gain.   Past Medical History:  Diagnosis Date   Adenocarcinoma of colon ([place]) 06/19/2013   a. moderately differentiated stage IIIc (T4bN2a M0) adenocarcinoma of colon  s/p colectomy followed by chemoRx with oxaliplatin & fluorouacil.   Anemia    CAD (coronary artery disease)    a. 02/2015 Cath: LM nl, LAD 50/40 mid/distal, LCX 80p, RCA 40p, 30d, RPL1 40, CO 3.27, CI 1.65; b. 06/09/15 PCI: LM minor irregs, m-dLAD 50%, dLAD 40%, pLCx 80% (s/p PCI/DES 0%), pRCA 40%, dRCA 30%, 1st RPLB 40%   Chronic kidney disease, stage 3a (HCC)    Chronic systolic CHF (congestive heart failure) ([place])    a. 02/2015 Echo:  EF 20-25%, diff HK, mod MR, mildy dil LA, mildly dil RV w/ mod RV syst dysfxn, mildly dil RA, mod TR, PASP 77mHg; b. 09/2017 Echo: EF 40-45%, diff HK. Gr1 DD. Mild to mod MR. Mildly to mod dil LA; c. 09/2019 Echo: EF 25-30%, glob HK, mod reduced RV fxn, sev elev PASP. Mod dil LA. Mild-mod MR; d. 05/2021 Echo: EF 20-25%, glob HK, sev red RV fxn, sev BAE, sev MR/TR/TS, triv AI.   Di[place] complication ([place]    Essential hypertension    Hypertensive heart disease    Left inguinal hernia 05/2020   Mixed Ischemic and Non-ischemic Cardiomyopathy    a. 02/2015 EF 20-25%, diff HK; b. 09/2016 EF 40-45%; c. 09/2019 EF 25-30%; d. 05/2021 Echo: EF 20-25%, glob HK.   Persistent atrial fibrillation ([facility] [place]    a. Dx 05/2021-->CHA2DS2VASc = 5-->eliquis.   Pulmonary hypertension (HCC)    Severe mitral regurgitation    a. 09/2017 Echo: Mild to mod MR; b. 09/2019 Echo: mild-mod MR; c. 05/2021 Echo: Sev MR/TR/TS.   Tubular adenoma of colon    Past Surgical History:  Procedure Laterality Date   APPENDECTOMY N/A 06/19/2017   [facility] Surgeon: WConsuela Mimes MD   CARDIAC CATHETERIZATION Bilateral 02/24/2015   Procedure: Right/Left Heart Cath and Coronary Angiography;  Surgeon: MWellington Hampshire MD;  [facility];  Service: Cardiovascular;  Laterality: Bilateral;   CARDIAC CATHETERIZATION N/A 06/09/2015   Procedure: Coronary Stent Intervention (3.0 x 12 mm Xience  Alpine DES to LCx);  Surgeon: Wellington Hampshire, MD;  Location: [facility] [facility];  Service: Cardiovascular;  Laterality: N/A   COLECTOMY  06/19/2017   Descending and proximal sigmoid colectomy, LEFT ureterolysis, partial cecectomy, appendectomy; Location: [place]; Surgeon: Consuela Mimes, MD   COLONOSCOPY     COLONOSCOPY WITH PROPOFOL N/A 12/11/2016   Procedure: COLONOSCOPY WITH PROPOFOL;  Surgeon: Lollie Sails, MD;  [facility]: [facility];  Service: Endoscopy;  Laterality: N/A;    ESOPHAGOGASTRODUODENOSCOPY     PORT-A-CATH REMOVAL N/A 07/12/2014   Procedure: REMOVAL PORT-A-CATH;  Surgeon: Molly Maduro, MD;  Location: [facility] ORS;  Service: General;  Laterality: N/A;   PORTACATH PLACEMENT Left 2015   Family History  Problem Relation Age of Onset   Cancer Mother    Social History   Tobacco Use   Smoking status: Never   Smokeless tobacco: Never  Substance Use Topics   Alcohol use: Not Currently    Comment: none in many years   Allergies  Allergen Reactions   Shellfish Allergy Other (See Comments) and Hives    Pt. instructed by MD to avoid seafood   Prior to Admission medications   Medication Sig Start Date End Date Taking? Authorizing Provider  apixaban (ELIQUIS) 5 MG TABS tablet TAKE 1 TABLET(5 MG) BY MOUTH TWICE DAILY 06/28/21  Yes Wellington Hampshire, MD  atorvastatin (LIPITOR) 40 MG tablet Take 1 tablet (40 mg total) by mouth daily. 06/26/21  Yes Wellington Hampshire, MD  carvedilol (COREG) 3.125 MG tablet Take 1 tablet (3.125 mg total) by mouth 2 (two) times daily with a meal. 06/26/21  Yes Wellington Hampshire, MD  cholecalciferol (VITAMIN D3) 25 MCG (1000 UNIT) tablet Take 1,000 Units by mouth daily.   Yes [provider]  docusate sodium (COLACE) 100 MG capsule Take 1 capsule (100 mg total) by mouth daily as needed for mild constipation. 11/29/20  Yes Earlie Server, MD  ferrous sulfate 325 (65 FE) MG tablet Take 325 mg by mouth daily with breakfast.   Yes [provider]  gabapentin (NEURONTIN) 100 MG capsule Take 100 mg by mouth 3 (three) times daily. Taking 2 -100 mg pills in am and 1 -100 mg at night 04/12/21 04/12/22 Yes [provider]  loratadine (CLARITIN) 10 MG tablet Take 1 tablet (10 mg total) by mouth daily. 05/02/21  Yes Leone Haven, MD  potassium chloride SA (KLOR-CON M20) 20 MEQ tablet Take 2 tablets (40 mEq total) by mouth daily. 07/13/21  Yes Theora Gianotti, NP  sacubitril-valsartan (ENTRESTO) 24-26 MG Take 1 tablet  by mouth 2 (two) times daily. 06/13/21  Yes Darylene Price A, FNP  spironolactone (ALDACTONE) 25 MG tablet Take 1 tablet (25 mg total) by mouth daily. 07/18/21  Yes Darylene Price A, FNP  torsemide (DEMADEX) 20 MG tablet Take 3 tablets (60 mg total) by mouth 2 (two) times daily. Take 4 tablets (80 mg) twice a day for 3 days THEN take 3 tablets (60 mg) twice a day Patient taking differently: Take 20 mg by mouth daily. 07/20/21 10/18/21 Yes Theora Gianotti, NP    Review of Systems  Constitutional:  Positive for fatigue (minimal). Negative for appetite change.  HENT:  Negative for congestion, postnasal drip and sore throat.   Eyes: Negative.   Respiratory:  Negative for cough, chest tightness and shortness of breath.   Cardiovascular:  Negative for chest pain, palpitations and leg swelling.  Gastrointestinal:  Negative for abdominal distention and abdominal pain.  Endocrine: Negative.   Genitourinary: Negative.   Musculoskeletal:  Positive for arthralgias (right shoulder).  Skin: Negative.   Allergic/Immunologic: Negative.   Neurological:  Positive for numbness (neuropathy in hands/legs). Negative for dizziness and light-headedness.  Hematological:  Negative for adenopathy. Does not bruise/bleed easily.  Psychiatric/Behavioral:  Positive for sleep disturbance (sleeping on 2 pillows). Negative for dysphoric mood. The patient is not nervous/anxious.    Vitals:   08/29/21 1525  BP: (!) 109/47  Pulse: (!) 56  Resp: 16  SpO2: 100%  Weight: 154 lb (69.9 kg)  Height: '5\' 9"'$  (1.753 m)   Wt Readings from Last 3 Encounters:  08/29/21 154 lb (69.9 kg)  08/23/21 156 lb (70.8 kg)  08/21/21 155 lb 6 oz (70.5 kg)   Lab Results  Component Value Date   CREATININE 1.76 (H) 08/21/2021   CREATININE 1.65 (H) 07/19/2021   CREATININE 1.65 (H) 07/12/2021   Physical Exam Vitals and nursing note reviewed. Exam conducted with a chaperone present (paramedic).  Constitutional:      Appearance: Normal  appearance.  HENT:     Head: Normocephalic and atraumatic.  Cardiovascular:     Rate and Rhythm: Regular rhythm. Bradycardia present.  Pulmonary:     Effort: Pulmonary effort is normal. No respiratory distress.     Breath sounds: No wheezing or rales.  Abdominal:     General: There is no distension.     Palpations: Abdomen is soft.  Musculoskeletal:        General: No tenderness.     Cervical back: Normal range of motion and neck supple.     Right lower leg: Edema (1+ pitting) present.     Left lower leg: Edema (1+ pitting) present.  Skin:    General: Skin is warm and dry.  Neurological:     General: No focal deficit present.     Mental Status: He is alert and oriented to person, place, and time.  Psychiatric:        Mood and Affect: Mood normal.        Behavior: Behavior normal.    Assessment and Plan:  Chronic Heart failure with reduced ejection fraction- - NYHA II - euvolemic - weighing daily; reminded to call with a weight gain 2-3lbs/night or 5 lbs/week  - weight down 5 pounds from last visit here 2 months ago - not adding salt - discussed adhering to ~ 64 ounces of total fluid/day - on GDMT entresto and spironolactone - bradycardic so unable to resume carvedilol at this  - consider SGLT2 if not cost prohibitive - he is quite active and says that prior to coming to this appointment, he had finished mowing his yard; says that he wets a towel and puts it around his neck - participating in paramedicine program & she was present for the clinic visit - BNP 05/29/21 was > 4,500  2. HTN- - BP looks good (109/47) - saw PCP Caryl Bis) 05/16/21 - BMP reviewed from 08/21/21 reviewed and showed Na 143, K 4.1, Cr 1.76, gfr 40   3. CAD - on lipitor - saw cardiology Sharolyn Douglas) 08/21/21  4. DM with CKD- - AIC 03/10/21 6.5% - saw nephrology Candiss Norse) 07/26/21    Medication bottles reviewed.   Return in 4 months, sooner if needed.

## 2021-09-04 ENCOUNTER — Other Ambulatory Visit: Payer: Self-pay | Admitting: Family

## 2021-09-06 ENCOUNTER — Other Ambulatory Visit (HOSPITAL_COMMUNITY): Payer: Self-pay

## 2021-09-07 NOTE — Progress Notes (Signed)
Today when arrived her meet me at the door.  He states has not picked up his meds yet, he asked if I could come back another day.  He states doing well, no problems.  He states no edema and weight is good.  He states really been feeling good.  Will visit again next week. He is pulling meds out of a bottle, he wants to use med box but can not get him where he has all his medicaitons when show up.   [place] [phone]

## 2021-09-08 ENCOUNTER — Ambulatory Visit: Payer: PPO | Admitting: Family Medicine

## 2021-09-15 ENCOUNTER — Inpatient Hospital Stay: Payer: PPO | Attending: Oncology

## 2021-09-15 DIAGNOSIS — Z85038 Personal history of other malignant neoplasm of large intestine: Secondary | ICD-10-CM | POA: Insufficient documentation

## 2021-09-15 DIAGNOSIS — E538 Deficiency of other specified B group vitamins: Secondary | ICD-10-CM | POA: Diagnosis not present

## 2021-09-15 MED ORDER — CYANOCOBALAMIN 1000 MCG/ML IJ SOLN
1000.0000 ug | INTRAMUSCULAR | Status: DC
  Administered 2021-09-15: 1000 ug via INTRAMUSCULAR
  Filled 2021-09-15: qty 1

## 2021-10-02 ENCOUNTER — Ambulatory Visit (INDEPENDENT_AMBULATORY_CARE_PROVIDER_SITE_OTHER): Payer: PPO

## 2021-10-02 VITALS — Ht 69.0 in | Wt 154.0 lb

## 2021-10-02 DIAGNOSIS — Z0000 Encounter for general adult medical examination without abnormal findings: Secondary | ICD-10-CM | POA: Diagnosis not present

## 2021-10-02 NOTE — Patient Instructions (Addendum)
[name] , Thank you for taking time to come for your Medicare Wellness Visit. I appreciate your ongoing commitment to your health goals. Please review the following plan we discussed and let me know if I can assist you in the future.   These are the goals we discussed:  Goals      Healthy lifestyle     Stay active Healthy diet        This is a list of the screening recommended for you and due dates:  Health Maintenance  Topic Date Due   Hemoglobin A1C  09/07/2021   Complete foot exam   10/06/2021*   COVID-19 Vaccine (1) 10/18/2021*   Zoster (Shingles) Vaccine (1 of 2) 01/02/2022*   Pneumonia Vaccine (1 - PCV) 01/05/2022*   Colon Cancer Screening  01/05/2022*   Tetanus Vaccine  01/05/2022*   Flu Shot  05/06/2022*   Eye exam for diabetics  03/27/2022   Hepatitis C Screening: USPSTF Recommendation to screen - Ages 18-79 yo.  Completed   HPV Vaccine  Aged Out  *Topic was postponed. The date shown is not the original due date.    Preventive Care 75 Years and Older, Male Preventive care refers to lifestyle choices and visits with your health care provider that can promote health and wellness. What does preventive care include? A yearly physical exam. This is also called an annual well check. Dental exams once or twice a year. Routine eye exams. Ask your health care provider how often you should have your eyes checked. Personal lifestyle choices, including: Daily care of your teeth and gums. Regular physical activity. Eating a healthy diet. Avoiding tobacco and drug use. Limiting alcohol use. Practicing safe sex. Taking low doses of aspirin every day. Taking vitamin and mineral supplements as recommended by your health care provider. What happens during an annual well check? The services and screenings done by your health care provider during your annual well check will depend on your age, overall health, lifestyle risk factors, and family history of disease. Counseling   Your health care provider may ask you questions about your: Alcohol use. Tobacco use. Drug use. Emotional well-being. Home and relationship well-being. Sexual activity. Eating habits. History of falls. Memory and ability to understand (cognition). Work and work Statistician. Screening  You may have the following tests or measurements: Height, weight, and BMI. Blood pressure. Lipid and cholesterol levels. These may be checked every 5 years, or more frequently if you are over 75 years old. Skin check. Lung cancer screening. You may have this screening every year starting at age 75 if you have a 30-pack-year history of smoking and currently smoke or have quit within the past 15 years. Fecal occult blood test (FOBT) of the stool. You may have this test every year starting at age 75. Flexible sigmoidoscopy or colonoscopy. You may have a sigmoidoscopy every 5 years or a colonoscopy every 10 years starting at age 75. Prostate cancer screening. Recommendations will vary depending on your family history and other risks. Hepatitis C blood test. Hepatitis B blood test. Sexually transmitted disease (STD) testing. Diabetes screening. This is done by checking your blood sugar (glucose) after you have not eaten for a while (fasting). You may have this done every 1-3 years. Abdominal aortic aneurysm (AAA) screening. You may need this if you are a current or former smoker. Osteoporosis. You may be screened starting at age 75 if you are at high risk. Talk with your health care provider about your test results,  treatment options, and if necessary, the need for more tests. Vaccines  Your health care provider may recommend certain vaccines, such as: Influenza vaccine. This is recommended every year. Tetanus, diphtheria, and acellular pertussis (Tdap, Td) vaccine. You may need a Td booster every 10 years. Zoster vaccine. You may need this after age 75. Pneumococcal 13-valent conjugate (PCV13) vaccine.  One dose is recommended after age 75. Pneumococcal polysaccharide (PPSV23) vaccine. One dose is recommended after age 75. Talk to your health care provider about which screenings and vaccines you need and how often you need them. This information is not intended to replace advice given to you by your health care provider. Make sure you discuss any questions you have with your health care provider. Document Released: 02/18/2015 Document Revised: 10/12/2015 Document Reviewed: 11/23/2014 Elsevier Interactive Patient Education  2017 [employer] Prevention in the Home Falls can cause injuries. They can happen to people of all ages. There are many things you can do to make your home safe and to help prevent falls. What can I do on the outside of my home? Regularly fix the edges of walkways and driveways and fix any cracks. Remove anything that might make you trip as you walk through a door, such as a raised step or threshold. Trim any bushes or trees on the path to your home. Use bright outdoor lighting. Clear any walking paths of anything that might make someone trip, such as rocks or tools. Regularly check to see if handrails are loose or broken. Make sure that both sides of any steps have handrails. Any raised decks and porches should have guardrails on the edges. Have any leaves, snow, or ice cleared regularly. Use sand or salt on walking paths during winter. Clean up any spills in your garage right away. This includes oil or grease spills. What can I do in the bathroom? Use night lights. Install grab bars by the toilet and in the tub and shower. Do not use towel bars as grab bars. Use non-skid mats or decals in the tub or shower. If you need to sit down in the shower, use a plastic, non-slip stool. Keep the floor dry. Clean up any water that spills on the floor as soon as it happens. Remove soap buildup in the tub or shower regularly. Attach bath mats securely with double-sided  non-slip rug tape. Do not have throw rugs and other things on the floor that can make you trip. What can I do in the bedroom? Use night lights. Make sure that you have a light by your bed that is easy to reach. Do not use any sheets or blankets that are too big for your bed. They should not hang down onto the floor. Have a firm chair that has side arms. You can use this for support while you get dressed. Do not have throw rugs and other things on the floor that can make you trip. What can I do in the kitchen? Clean up any spills right away. Avoid walking on wet floors. Keep items that you use a lot in easy-to-reach places. If you need to reach something above you, use a strong step stool that has a grab bar. Keep electrical cords out of the way. Do not use floor polish or wax that makes floors slippery. If you must use wax, use non-skid floor wax. Do not have throw rugs and other things on the floor that can make you trip. What can I do with my stairs? Do  not leave any items on the stairs. Make sure that there are handrails on both sides of the stairs and use them. Fix handrails that are broken or loose. Make sure that handrails are as long as the stairways. Check any carpeting to make sure that it is firmly attached to the stairs. Fix any carpet that is loose or worn. Avoid having throw rugs at the top or bottom of the stairs. If you do have throw rugs, attach them to the floor with carpet tape. Make sure that you have a light switch at the top of the stairs and the bottom of the stairs. If you do not have them, ask someone to add them for you. What else can I do to help prevent falls? Wear shoes that: Do not have high heels. Have rubber bottoms. Are comfortable and fit you well. Are closed at the toe. Do not wear sandals. If you use a stepladder: Make sure that it is fully opened. Do not climb a closed stepladder. Make sure that both sides of the stepladder are locked into place. Ask  someone to hold it for you, if possible. Clearly mark and make sure that you can see: Any grab bars or handrails. First and last steps. Where the edge of each step is. Use tools that help you move around (mobility aids) if they are needed. These include: Canes. Walkers. Scooters. Crutches. Turn on the lights when you go into a dark area. Replace any light bulbs as soon as they burn out. Set up your furniture so you have a clear path. Avoid moving your furniture around. If any of your floors are uneven, fix them. If there are any pets around you, be aware of where they are. Review your medicines with your doctor. Some medicines can make you feel dizzy. This can increase your chance of falling. Ask your doctor what other things that you can do to help prevent falls. This information is not intended to replace advice given to you by your health care provider. Make sure you discuss any questions you have with your health care provider. Document Released: 11/18/2008 Document Revised: 06/30/2015 Document Reviewed: 02/26/2014 Elsevier Interactive Patient Education  2017 [employer].

## 2021-10-02 NOTE — Progress Notes (Signed)
Subjective:   [name]. is a 76 y.o. male who presents for an Initial Medicare Annual Wellness Visit.  Review of Systems    No ROS.  Medicare Wellness Virtual Visit.  Visual/audio telehealth visit, UTA vital signs.   See social history for additional risk factors.   Cardiac Risk Factors include: advanced age (>4mn, >>83women);male gender;diabetes mellitus;hypertension     Objective:    Today's Vitals   10/02/21 1403  Weight: 154 lb (69.9 kg)  Height: '5\' 9"'$  (1.753 m)   Body mass index is 22.74 kg/m.     10/02/2021    2:03 PM 05/29/2021    3:50 PM 05/18/2021   11:08 AM 04/14/2021   11:59 AM 11/29/2020    1:26 PM 09/07/2020   10:23 AM 09/02/2020   11:02 AM  Advanced Directives  Does Patient Have a Medical Advance Directive? No No No No No No No  Would patient like information on creating a medical advance directive? No - Patient declined     No - Patient declined No - Patient declined    Current Medications (verified) Outpatient Encounter Medications as of 10/02/2021  Medication Sig   apixaban (ELIQUIS) 5 MG TABS tablet TAKE 1 TABLET(5 MG) BY MOUTH TWICE DAILY   atorvastatin (LIPITOR) 40 MG tablet Take 1 tablet (40 mg total) by mouth daily.   carvedilol (COREG) 3.125 MG tablet Take 1 tablet (3.125 mg total) by mouth 2 (two) times daily with a meal.   cholecalciferol (VITAMIN D3) 25 MCG (1000 UNIT) tablet Take 1,000 Units by mouth daily.   docusate sodium (COLACE) 100 MG capsule Take 1 capsule (100 mg total) by mouth daily as needed for mild constipation.   ferrous sulfate 325 (65 FE) MG tablet Take 325 mg by mouth daily with breakfast.   gabapentin (NEURONTIN) 100 MG capsule Take 100 mg by mouth 3 (three) times daily. Taking 2 -100 mg pills in am and 1 -100 mg at night   loratadine (CLARITIN) 10 MG tablet Take 1 tablet (10 mg total) by mouth daily.   potassium chloride SA (KLOR-CON M20) 20 MEQ tablet Take 2 tablets (40 mEq total) by mouth daily.    sacubitril-valsartan (ENTRESTO) 24-26 MG Take 1 tablet by mouth 2 (two) times daily.   spironolactone (ALDACTONE) 25 MG tablet TAKE 1 TABLET(25 MG) BY MOUTH DAILY   torsemide (DEMADEX) 20 MG tablet Take 3 tablets (60 mg total) by mouth 2 (two) times daily. Take 4 tablets (80 mg) twice a day for 3 days THEN take 3 tablets (60 mg) twice a day (Patient taking differently: Take 20 mg by mouth daily.)   No facility-administered encounter medications on file as of 10/02/2021.    Allergies (verified) Shellfish allergy   History: Past Medical History:  Diagnosis Date   Adenocarcinoma of colon ([facility] 06/19/2013   a. moderately differentiated stage IIIc (T4bN2a M0) adenocarcinoma of colon  s/p colectomy followed by chemoRx with oxaliplatin & fluorouacil.   Anemia    CAD (coronary artery disease)    a. 02/2015 Cath: LM nl, LAD 50/40 mid/distal, LCX 80p, RCA 40p, 30d, RPL1 40, CO 3.27, CI 1.65; b. 06/09/15 PCI: LM minor irregs, m-dLAD 50%, dLAD 40%, pLCx 80% (s/p PCI/DES 0%), pRCA 40%, dRCA 30%, 1st RPLB 40%   Chronic kidney disease, stage 3a (HCC)    Chronic systolic CHF (congestive heart failure) ([facility]    a. 02/2015 Echo: EF 20-25%, diff HK, mod MR, mildy dil LA, mildly dil RV w/ mod  RV syst dysfxn, mildly dil RA, mod TR, PASP 68mHg; b. 09/2017 Echo: EF 40-45%, diff HK. Gr1 DD. Mild to mod MR. Mildly to mod dil LA; c. 09/2019 Echo: EF 25-30%, glob HK, mod reduced RV fxn, sev elev PASP. Mod dil LA. Mild-mod MR; d. 05/2021 Echo: EF 20-25%, glob HK, sev red RV fxn, sev BAE, sev MR/TR/TS, triv AI.   Diabetes mellitus without complication ([facility]    Essential hypertension    Hypertensive heart disease    Left inguinal hernia 05/2020   Mixed Ischemic and Non-ischemic Cardiomyopathy    a. 02/2015 EF 20-25%, diff HK; b. 09/2016 EF 40-45%; c. 09/2019 EF 25-30%; d. 05/2021 Echo: EF 20-25%, glob HK.   Persistent atrial fibrillation ([facility]    a. Dx 05/2021-->CHA2DS2VASc = 5-->eliquis.   Pulmonary hypertension (HCC)     Severe mitral regurgitation    a. 09/2017 Echo: Mild to mod MR; b. 09/2019 Echo: mild-mod MR; c. 05/2021 Echo: Sev MR/TR/TS.   Tubular adenoma of colon    Past Surgical History:  Procedure Laterality Date   APPENDECTOMY N/A 06/19/2017   [facility] Surgeon: WConsuela Mimes MD   CARDIAC CATHETERIZATION Bilateral 02/24/2015   Procedure: Right/Left Heart Cath and Coronary Angiography;  Surgeon: MWellington Hampshire MD;  [facility];  Service: Cardiovascular;  Laterality: Bilateral;   CARDIAC CATHETERIZATION N/A 06/09/2015   Procedure: Coronary Stent Intervention (3.0 x 12 mm Xience [place] DES to LCx);  Surgeon: MWellington Hampshire MD;  [facility];  Service: Cardiovascular;  Laterality: N/A   COLECTOMY  06/19/2017   Descending and proximal sigmoid colectomy, LEFT ureterolysis, partial cecectomy, appendectomy; [facility] Surgeon: WConsuela Mimes MD   COLONOSCOPY     COLONOSCOPY WITH PROPOFOL N/A 12/11/2016   Procedure: COLONOSCOPY WITH PROPOFOL;  Surgeon: SLollie Sails MD;  [facility];  Service: Endoscopy;  Laterality: N/A;   ESOPHAGOGASTRODUODENOSCOPY     PORT-A-CATH REMOVAL N/A 07/12/2014   Procedure: REMOVAL PORT-A-CATH;  Surgeon: WMolly Maduro MD;  Location: [facility];  Service: General;  Laterality: N/A;   PORTACATH PLACEMENT Left 2015   Family History  Problem Relation Age of Onset   Cancer Mother    Social History   Socioeconomic History   Marital status: Married    Spouse name: JRomie Minus  Number of children: 4   Years of education: Not on file   Highest education level: Not on file  Occupational History   Occupation: retired  Tobacco Use   Smoking status: Never   Smokeless tobacco: Never  Vaping Use   Vaping Use: Never used  Substance and Sexual Activity   Alcohol use: Not Currently    Comment: none in many years   Drug use: No   Sexual activity: Yes  Other Topics Concern   Not on file  Social  History Narrative   Not on file   Social Determinants of Health   Financial Resource Strain: Low Risk  (10/02/2021)   Overall Financial Resource Strain (CARDIA)    Difficulty of Paying Living Expenses: Not hard at all  Food Insecurity: No Food Insecurity (10/02/2021)   Hunger Vital Sign    Worried About Running Out of Food in the Last Year: Never true    RCoralin the Last Year: Never true  Transportation Needs: No Transportation Needs (10/02/2021)   PRAPARE - THydrologist(Medical): No    Lack of Transportation (Non-Medical): No  Physical  Activity: Not on file  Stress: No Stress Concern Present (10/02/2021)   [place]    Feeling of Stress : Not at all  Social Connections: Unknown (10/02/2021)   Social Connection and Isolation Panel [NHANES]    Frequency of Communication with Friends and Family: Not on file    Frequency of Social Gatherings with Friends and Family: Not on file    Attends Religious Services: Not on file    Active Member of Clubs or Organizations: Not on file    Attends Archivist Meetings: Not on file    Marital Status: Married    Tobacco Counseling Counseling given: Not Answered   Clinical Intake:  Pre-visit preparation completed: Yes        Diabetes: No  How often do you need to have someone help you when you read instructions, pamphlets, or other written materials from your doctor or pharmacy?: 1 - Never   Interpreter Needed?: No    Activities of Daily Living    10/02/2021    2:35 PM 08/29/2021    3:25 PM  In your present state of health, do you have any difficulty performing the following activities:  Hearing? 0 0  Vision? 0 0  Difficulty concentrating or making decisions? 0 0  Walking or climbing stairs? 0 0  Dressing or bathing? 0 0  Doing errands, shopping? 1 0  Comment Wife assist   Preparing Food and eating ? N   Using the  Toilet? N   In the past six months, have you accidently leaked urine? N   Do you have problems with loss of bowel control? N   Managing your Medications? N   Managing your Finances? N   Housekeeping or managing your Housekeeping? N     Patient Care Team: Leone Haven, MD as PCP - General (Family Medicine) Wellington Hampshire, MD as PCP - Cardiology (Cardiology) Wellington Hampshire, MD as Consulting Physician (Cardiology) Lavonia Dana, MD as Consulting Physician (Internal Medicine) Lollie Sails, MD (Inactive) as Consulting Physician (Gastroenterology) Ok Edwards, NP as Nurse Practitioner (Gastroenterology) Earlie Server, MD as Consulting Physician (Oncology)  Indicate any recent Medical Services you may have received from other than [facility] providers in the past year (date may be approximate).     Assessment:   This is a routine wellness examination for [name].  Virtual Visit via Telephone Note  I connected with  [name]. on 10/02/21 at  2:00 PM EDT by telephone and verified that I am speaking with the correct person using two identifiers.  Location: Patient: home Provider: office Persons participating in the virtual visit: patient/Nurse Health Advisor   I discussed the limitations of performing an evaluation and management service by telehealth. We continued and completed visit with audio only. Some vital signs may be absent or patient reported.   Hearing/Vision screen Hearing Screening - Comments:: Patient is able to hear conversational tones without difficulty.  No issues reported. Vision Screening - Comments:: Wears corrective lenses No retinopathy reported They have seen their ophthalmologist in the last 12 months.    Dietary issues and exercise activities discussed: Current Exercise Habits: Home exercise routine, Intensity: Mild Regular diet    Goals Addressed             This Visit's Progress    Healthy lifestyle       Stay  active Healthy diet       Depression Screen  10/02/2021    2:06 PM 06/13/2021    9:44 AM 04/18/2021    9:45 AM 03/10/2021   10:42 AM 09/01/2020   10:02 AM 01/01/2018    4:03 PM  PHQ 2/9 Scores  PHQ - 2 Score 0 0 0 0 0 0    Fall Risk    10/02/2021    2:35 PM 08/29/2021    3:25 PM 06/13/2021    9:44 AM 05/15/2021    1:22 PM 04/18/2021    9:44 AM  Fall Risk   Falls in the past year? 0 0 0 0 1  Number falls in past yr: 0 0 0 0 0  Injury with Fall? 0 0 0 0 0  Risk for fall due to :   Impaired mobility  History of fall(s)  Follow up Falls evaluation completed Falls evaluation completed Falls evaluation completed;Falls prevention discussed;Education provided Falls evaluation completed Falls evaluation completed;Education provided;Falls prevention discussed    FALL RISK PREVENTION PERTAINING TO THE HOME: Home free of loose throw rugs in walkways, pet beds, electrical cords, etc? Yes  Adequate lighting in your home to reduce risk of falls? Yes   ASSISTIVE DEVICES UTILIZED TO PREVENT FALLS: Life alert? No  Use of a cane, walker or w/c? Yes , cane as needed Grab bars in the bathroom? No  Shower chair or bench in shower? No  Elevated toilet seat or a handicapped toilet? No   TIMED UP AND GO: Was the test performed? No .   Cognitive Function:        10/02/2021    2:37 PM  6CIT Screen  What Year? 0 points  What month? 0 points  What time? 0 points  Count back from 20 0 points  Months in reverse 0 points  Repeat phrase 0 points  Total Score 0 points    Immunizations  There is no immunization history on file for this patient.  TDAP status: Due, Education has been provided regarding the importance of this vaccine. Advised may receive this vaccine at local pharmacy or Health Dept. Aware to provide a copy of the vaccination record if obtained from local pharmacy or Health Dept. Verbalized acceptance and understanding.  Flu Vaccine status: Due, Education has been provided  regarding the importance of this vaccine. Advised may receive this vaccine at local pharmacy or Health Dept. Aware to provide a copy of the vaccination record if obtained from local pharmacy or Health Dept. Verbalized acceptance and understanding.  Pneumococcal vaccine status: Due, Education has been provided regarding the importance of this vaccine. Advised may receive this vaccine at local pharmacy or Health Dept. Aware to provide a copy of the vaccination record if obtained from local pharmacy or Health Dept. Verbalized acceptance and understanding.  Covid-19 vaccine status: Declined, Education has been provided regarding the importance of this vaccine but patient still declined. Advised may receive this vaccine at local pharmacy or Health Dept.or vaccine clinic. Aware to provide a copy of the vaccination record if obtained from local pharmacy or Health Dept. Verbalized acceptance and understanding.  Shingrix Completed?: No.    Education has been provided regarding the importance of this vaccine. Patient has been advised to call insurance company to determine out of pocket expense if they have not yet received this vaccine. Advised may also receive vaccine at local pharmacy or Health Dept. Verbalized acceptance and understanding.  Screening Tests Health Maintenance  Topic Date Due   HEMOGLOBIN A1C  09/07/2021   FOOT EXAM  10/06/2021 (Originally 09/01/2021)  COVID-19 Vaccine (1) 10/18/2021 (Originally 12/20/1950)   Zoster Vaccines- Shingrix (1 of 2) 01/02/2022 (Originally 12/19/1964)   Pneumonia Vaccine 51+ Years old (1 - PCV) 01/05/2022 (Originally 12/19/1951)   COLONOSCOPY (Pts 45-36yr Insurance coverage will need to be confirmed)  01/05/2022 (Originally 12/12/2018)   TETANUS/TDAP  01/05/2022 (Originally 12/19/1964)   INFLUENZA VACCINE  05/06/2022 (Originally 09/05/2021)   OPHTHALMOLOGY EXAM  03/27/2022   Hepatitis C Screening  Completed   HPV VACCINES  Aged Out   Health Maintenance Health  Maintenance Due  Topic Date Due   HEMOGLOBIN A1C  09/07/2021   Colonoscopy- deferred per patient.   Lung Cancer Screening: (Low Dose CT Chest recommended if Age 75-80years, 30 pack-year currently smoking OR have quit w/in 15years.) does not qualify.   Vision Screening: Recommended annual ophthalmology exams for early detection of glaucoma and other disorders of the eye.  Dental Screening: Recommended annual dental exams for proper oral hygiene  Community Resource Referral / Chronic Care Management: CRR required this visit?  No   CCM required this visit?  No      Plan:     I have personally reviewed and noted the following in the patient's chart:   Medical and social history Use of alcohol, tobacco or illicit drugs  Current medications and supplements including opioid prescriptions. Patient is not currently taking opioid prescriptions. Functional ability and status Nutritional status Physical activity Advanced directives List of other physicians Hospitalizations, surgeries, and ER visits in previous 12 months Vitals Screenings to include cognitive, depression, and falls Referrals and appointments  In addition, I have reviewed and discussed with patient certain preventive protocols, quality metrics, and best practice recommendations. A written personalized care plan for preventive services as well as general preventive health recommendations were provided to patient.     OVarney Biles LPN   [id number]

## 2021-10-05 ENCOUNTER — Encounter: Payer: Self-pay | Admitting: Family Medicine

## 2021-10-05 ENCOUNTER — Ambulatory Visit (INDEPENDENT_AMBULATORY_CARE_PROVIDER_SITE_OTHER): Payer: PPO | Admitting: Family Medicine

## 2021-10-05 VITALS — BP 116/66 | HR 51 | Temp 97.5°F | Ht 69.0 in | Wt 156.6 lb

## 2021-10-05 DIAGNOSIS — R21 Rash and other nonspecific skin eruption: Secondary | ICD-10-CM | POA: Diagnosis not present

## 2021-10-05 DIAGNOSIS — N183 Chronic kidney disease, stage 3 unspecified: Secondary | ICD-10-CM

## 2021-10-05 DIAGNOSIS — E1122 Type 2 diabetes mellitus with diabetic chronic kidney disease: Secondary | ICD-10-CM | POA: Diagnosis not present

## 2021-10-05 LAB — HEMOGLOBIN A1C: Hgb A1c MFr Bld: 6.2 % (ref 4.6–6.5)

## 2021-10-05 MED ORDER — CLOTRIMAZOLE-BETAMETHASONE 1-0.05 % EX CREA
1.0000 | TOPICAL_CREAM | Freq: Two times a day (BID) | CUTANEOUS | 0 refills | Status: DC
Start: 2021-10-05 — End: 2022-01-23

## 2021-10-05 NOTE — Patient Instructions (Signed)
It was a pleasure meeting you today. Thank you for allowing me to take part in your health care.  Our goals for today as we discussed include:  For your rash Start Lotrisone cream twice a day for 5-7 days  We will get some labs today.  If they are abnormal or we need to do something about them, I will call you.  If they are normal, I will send you a message on MyChart (if it is active) or a letter in the mail.  If you don't hear from [place] in 2 weeks, please call the office at the number below.    Please follow-up with PCP in 1 week.  Please bring in all of your medications at your next visit for review.  If you have any questions or concerns, please do not hesitate to call the office at [phone].  I look forward to our next visit and until then take care and stay safe.  Regards,   Carollee Leitz, MD   [facility]

## 2021-10-05 NOTE — Progress Notes (Signed)
    SUBJECTIVE:   CHIEF COMPLAINT / HPI: shingles rash  Patient reports continued itching from shingles since March 2023.  Denies any fevers, pain, blistering and rash has not spread.    PERTINENT  PMH / PSH:  HfrEF DM Type 2 HTN Afib on Eliquis HLD  OBJECTIVE:   BP 116/66 (BP Location: Left Arm, Patient Position: Sitting, Cuff Size: Normal)   Pulse (!) 51   Temp (!) 97.5 F (36.4 C) (Oral)   Ht '5\' 9"'$  (1.753 m)   Wt 156 lb 9.6 oz (71 kg)   SpO2 99%   BMI 23.13 kg/m    General: Alert, no acute distress Cardio: Normal S1 and S2, RRR, no r/m/g Pulm: CTAB, normal work of breathing Derm:     Extremities: No peripheral edema.  Neuro: Cranial nerves grossly intact   ASSESSMENT/PLAN:   Rash in adult Chronic itching rash for 6 months.  Low suspicion for shingles given no pain or blistering.  Unknown etiology.  Considered fungal or other infectious etiology. -RPR and Varicella titre today -Lotrisone cream BID -Follow up in 1 week, consider Dermatology referral if no improvement  Diabetes ([place]) A1c today.   Patient is unsure what medications he is taking.  I have asked him to bring in all medications in 1 week for review.   PDMP Reviewed  Carollee Leitz, MD

## 2021-10-06 ENCOUNTER — Ambulatory Visit: Payer: PPO | Admitting: Family Medicine

## 2021-10-06 LAB — VARICELLA ZOSTER ANTIBODY, IGG: Varicella IgG: >4000 index

## 2021-10-06 LAB — RPR: RPR Ser Ql: NONREACTIVE

## 2021-10-10 ENCOUNTER — Ambulatory Visit: Payer: PPO | Attending: Nurse Practitioner | Admitting: Nurse Practitioner

## 2021-10-10 ENCOUNTER — Encounter: Payer: Self-pay | Admitting: Nurse Practitioner

## 2021-10-10 ENCOUNTER — Encounter: Payer: Self-pay | Admitting: Family Medicine

## 2021-10-10 ENCOUNTER — Other Ambulatory Visit: Payer: Self-pay | Admitting: Cardiovascular Disease

## 2021-10-10 VITALS — BP 148/58 | HR 56 | Ht 69.0 in | Wt 159.6 lb

## 2021-10-10 DIAGNOSIS — E785 Hyperlipidemia, unspecified: Secondary | ICD-10-CM | POA: Diagnosis not present

## 2021-10-10 DIAGNOSIS — I2510 Atherosclerotic heart disease of native coronary artery without angina pectoris: Secondary | ICD-10-CM

## 2021-10-10 DIAGNOSIS — I5022 Chronic systolic (congestive) heart failure: Secondary | ICD-10-CM

## 2021-10-10 DIAGNOSIS — Z79899 Other long term (current) drug therapy: Secondary | ICD-10-CM

## 2021-10-10 DIAGNOSIS — N1832 Chronic kidney disease, stage 3b: Secondary | ICD-10-CM

## 2021-10-10 DIAGNOSIS — I4819 Other persistent atrial fibrillation: Secondary | ICD-10-CM | POA: Diagnosis not present

## 2021-10-10 DIAGNOSIS — I255 Ischemic cardiomyopathy: Secondary | ICD-10-CM | POA: Diagnosis not present

## 2021-10-10 MED ORDER — WARFARIN SODIUM 5 MG PO TABS
ORAL_TABLET | ORAL | 1 refills | Status: DC
Start: 2021-10-10 — End: 2021-11-08

## 2021-10-10 MED ORDER — LOSARTAN POTASSIUM 25 MG PO TABS: 25.0000 mg | ORAL_TABLET | Freq: Every day | ORAL | 6 refills | Status: DC

## 2021-10-10 NOTE — Telephone Encounter (Signed)
Please review for refill at appt today. Thank you!

## 2021-10-10 NOTE — Patient Instructions (Addendum)
Medication Instructions:  - Your physician has recommended you make the following change in your medication:   1) STOP Entresto  2) START Losartan 25 mg: - take 1 tablet by mouth once daily   3) STOP Eliquis  4) START Coumadin (Warfarin) 5 mg: - take 1 tablet by mouth once daily   *If you need a refill on your cardiac medications before your next appointment, please call your pharmacy*   Lab Work: - Your physician recommends that you return for lab work in: 1 week  [place] at [facility] 1st desk on the right to check in (REGISTRATION)  Lab hours: Monday- Friday (7:30 am- 5:30 pm)   If you have labs (blood work) drawn today and your tests are completely normal, you will receive your results only by: MyChart Message (if you have MyChart) OR A paper copy in the mail If you have any lab test that is abnormal or we need to change your treatment, we will call you to review the results.   Testing/Procedures: - none ordered   Follow-Up: At [facility], you and your health needs are our priority.  As part of our continuing mission to provide you with exceptional heart care, we have created designated Provider Care Teams.  These Care Teams include your primary Cardiologist (physician) and Advanced Practice Providers (APPs -  Physician Assistants and Nurse Practitioners) who all work together to provide you with the care you need, when you need it.  We recommend signing up for the patient portal called "MyChart".  Sign up information is provided on this After Visit Summary.  MyChart is used to connect with patients for Virtual Visits (Telemedicine).  Patients are able to view lab/test results, encounter notes, upcoming appointments, etc.  Non-urgent messages can be sent to your provider as well.   To learn more about what you can do with MyChart, go to [email or web address].    Your next appointment:   1) New Coumadin Appointment- next Wednesday (10/18/21)    2) 6 weeks with Dr. Fletcher Anon Ignacia Bayley, NP  The format for your next appointment:   In Person  Provider:   As Above  Other Instructions N/a  Important Information About Sugar

## 2021-10-10 NOTE — Progress Notes (Signed)
Office Visit    Patient Name: [name]. Date of Encounter: 10/10/2021  Primary Care Provider:  Leone Haven, MD Primary Cardiologist:  Kathlyn Sacramento, MD  Chief Complaint    76 year old male with a history of CAD status post circumflex stenting, chronic HFrEF, mixed ischemic and nonischemic cardiomyopathy, hypertension, hyperlipidemia, stage III chronic kidney disease, and colon cancer, who presents for heart failure follow-up.  Past Medical History    Past Medical History:  Diagnosis Date   Adenocarcinoma of colon ([place]) 06/19/2013   a. moderately differentiated stage IIIc (T4bN2a M0) adenocarcinoma of colon  s/p colectomy followed by chemoRx with oxaliplatin & fluorouacil.   Anemia    CAD (coronary artery disease)    a. 02/2015 Cath: LM nl, LAD 50/40 mid/distal, LCX 80p, RCA 40p, 30d, RPL1 40, CO 3.27, CI 1.65; b. 06/09/15 PCI: LM minor irregs, m-dLAD 50%, dLAD 40%, pLCx 80% (s/p PCI/DES 0%), pRCA 40%, dRCA 30%, 1st RPLB 40%   Chronic kidney disease, stage 3a (HCC)    Chronic systolic CHF (congestive heart failure) ([place])    a. 02/2015 Echo: EF 20-25%, diff HK, mod MR, mildy dil LA, mildly dil RV w/ mod RV syst dysfxn, mildly dil RA, mod TR, PASP 58mHg; b. 09/2017 Echo: EF 40-45%, diff HK. Gr1 DD. Mild to mod MR. Mildly to mod dil LA; c. 09/2019 Echo: EF 25-30%, glob HK, mod reduced RV fxn, sev elev PASP. Mod dil LA. Mild-mod MR; d. 05/2021 Echo: EF 20-25%, glob HK, sev red RV fxn, sev BAE, sev MR/TR/TS, triv AI.   Diabetes mellitus without complication ([place]    Essential hypertension    Hypertensive heart disease    Left inguinal hernia 05/2020   Mixed Ischemic and Non-ischemic Cardiomyopathy    a. 02/2015 EF 20-25%, diff HK; b. 09/2016 EF 40-45%; c. 09/2019 EF 25-30%; d. 05/2021 Echo: EF 20-25%, glob HK.   Persistent atrial fibrillation ([place]    a. Dx 05/2021-->CHA2DS2VASc = 5-->eliquis.   Pulmonary hypertension (HCC)    Severe mitral regurgitation    a. 09/2017 Echo:  Mild to mod MR; b. 09/2019 Echo: mild-mod MR; c. 05/2021 Echo: Sev MR/TR/TS.   Tubular adenoma of colon    Past Surgical History:  Procedure Laterality Date   APPENDECTOMY N/A 06/19/2017   [place] [facility] [place]geon: WConsuela Mimes MD   CARDIAC CATHETERIZATION Bilateral 02/24/2015   Procedure: Right/Left Heart Cath and Coronary Angiography;  Surgeon: MWellington Hampshire MD;  [facility];  Service: Cardiovascular;  Laterality: Bilateral;   CARDIAC CATHETERIZATION N/A 06/09/2015   Procedure: Coronary Stent Intervention (3.0 x 12 mm Xience [place]pine DES to LCx);  Surgeon: MWellington Hampshire MD;  [facility];  Service: Cardiovascular;  Laterality: N/A   COLECTOMY  06/19/2017   Descending and proximal sigmoid colectomy, LEFT ureterolysis, partial cecectomy, appendectomy; [place] [facility] Surgeon: WConsuela Mimes MD   COLONOSCOPY     COLONOSCOPY WITH PROPOFOL N/A 12/11/2016   Procedure: COLONOSCOPY WITH PROPOFOL;  Surgeon: SLollie Sails MD;  [facility];  Service: Endoscopy;  Laterality: N/A;   ESOPHAGOGASTRODUODENOSCOPY     PORT-A-CATH REMOVAL N/A 07/12/2014   Procedure: REMOVAL PORT-A-CATH;  Surgeon: WMolly Maduro MD;  Location: [facility];  Service: General;  Laterality: N/A;   PORTACATH PLACEMENT Left 2015    Allergies  Allergies  Allergen Reactions   Shellfish Allergy Other (See Comments) and Hives    Pt. instructed by MD to avoid seafood  History of Present Illness    76 year old male with above complex past medical history including CAD status post circumflex stenting, chronic HFrEF, mixed ischemic and nonischemic cardiomyopathy, hypertension, hyperlipidemia, stage III chronic kidney disease, and colon cancer.  In the setting of colon cancer, he underwent colectomy in 2015 followed by chemotherapy with oxaliplatin and fluorouracil.  He subsequently developed neuropathy.  In January 2017, he was diagnosed with systolic heart  failure when echo showed an EF of 20 to 25% with moderate MR/TR, and moderate to severe pulmonary hypertension.  Right and left heart cardiac catheterization showed moderate to severe pulmonary hypertension with severely elevated filling pressures and severely reduced cardiac output.  Coronary angiography showed severe one-vessel CAD with an 80 to 90% stenosis in the proximal left circumflex, and mild diffuse disease affecting the LAD and RCA.  He underwent staged PCI of the left circumflex in May 2017.  Follow-up echo November 2020 showed an EF of 30 to 35%.  In the setting of PVCs, outpatient monitoring was performed and showed a 6% PVC burden, which improved with resumption of carvedilol.  Echo in April 2021 showed persistent LV dysfunction with an EF of 25 to 30%, severe PAH, and mild to moderate MR.  He was offered EP referral for ICD therapy however, he declined.  In April 2023, he required admission secondary to massive volume overload and weight gain despite titration of outpatient diuretics.  Echo again showed an EF of 20 to 25% with severely dilated left ventricle, severely reduced RV function, severe biatrial enlargement, severe MR/TR, circumferential pericardial effusion, and trivial AI.  After significant diuresis, he was discharged home in 151 pounds.  Unfortunately, by June 7 at outpatient follow-up, he was back up to 173 pounds in the setting of noncompliance with torsemide.  He was markedly volume overloaded and declined readmission.  With torsemide compliance, he had significant weight loss with relatively stable labs on July 17.  Amlodipine therapy was discontinued at that visit in the setting of soft blood pressure.  Weight was stable at 154 pounds at heart failure clinic follow-up on July 25.  Since his last visit, [name] has felt well.  His weight has been stable and he denies chest pain, dyspnea, palpitations, PND, orthopnea, dizziness, syncope, edema, or early satiety.  His blood  pressure is elevated today.  He does not check this at home.  He indicates that he has not been taking either Entresto or Eliquis secondary to cost and he would like to be switched to alternate agents.  Home Medications    Current Outpatient Medications  Medication Sig Dispense Refill   atorvastatin (LIPITOR) 40 MG tablet Take 1 tablet (40 mg total) by mouth daily. 90 tablet 3   carvedilol (COREG) 3.125 MG tablet Take 1 tablet (3.125 mg total) by mouth 2 (two) times daily with a meal. 180 tablet 3   cholecalciferol (VITAMIN D3) 25 MCG (1000 UNIT) tablet Take 1,000 Units by mouth daily.     clotrimazole-betamethasone (LOTRISONE) cream Apply 1 Application topically 2 (two) times daily. 30 g 0   docusate sodium (COLACE) 100 MG capsule Take 1 capsule (100 mg total) by mouth daily as needed for mild constipation. 30 capsule 5   ferrous sulfate 325 (65 FE) MG tablet Take 325 mg by mouth daily with breakfast.     gabapentin (NEURONTIN) 100 MG capsule Take 100 mg by mouth 3 (three) times daily. Taking 2 -100 mg pills in am and 1 -100 mg at night  loratadine (CLARITIN) 10 MG tablet Take 1 tablet (10 mg total) by mouth daily. 30 tablet 11   losartan (COZAAR) 25 MG tablet Take 1 tablet (25 mg total) by mouth daily. 30 tablet 6   potassium chloride SA (KLOR-CON M20) 20 MEQ tablet Take 2 tablets (40 mEq total) by mouth daily. 60 tablet 6   spironolactone (ALDACTONE) 25 MG tablet TAKE 1 TABLET(25 MG) BY MOUTH DAILY 30 tablet 5   torsemide (DEMADEX) 20 MG tablet Take 3 tablets (60 mg total) by mouth 2 (two) times daily. Take 4 tablets (80 mg) twice a day for 3 days THEN take 3 tablets (60 mg) twice a day (Patient taking differently: Take 20 mg by mouth daily.) 180 tablet 1   warfarin (COUMADIN) 5 MG tablet Take 1 tablet (5 mg) by mouth once daily as directed by the coumadin clinic 30 tablet 1   No current facility-administered medications for this visit.     Review of Systems    He denies chest pain,  palpitations, dyspnea, pnd, orthopnea, n, v, dizziness, syncope, edema, weight gain, or early satiety.  All other systems reviewed and are otherwise negative except as noted above.    Physical Exam    VS:  BP (!) 148/58 (BP Location: Left Arm, Patient Position: Sitting, Cuff Size: Normal)   Pulse (!) 56   Ht '5\' 9"'$  (1.753 m)   Wt 159 lb 9.6 oz (72.4 kg)   SpO2 99%   BMI 23.57 kg/m  , BMI Body mass index is 23.57 kg/m.     GEN: Well nourished, well developed, in no acute distress. HEENT: normal. Neck: Supple, no JVD, carotid bruits, or masses. Cardiac: RRR, no murmurs, rubs, or gallops.  No clubbing, cyanosis, edema.  Radials 2+/PT 1+ and equal bilaterally.  Respiratory:  Respirations regular and unlabored, clear to auscultation bilaterally. GI: Soft, nontender, nondistended, BS + x 4. MS: no deformity or atrophy. Skin: warm and dry, no rash. Neuro:  Strength and sensation are intact. Psych: Normal affect.  Accessory Clinical Findings    ECG personally reviewed by me today -sinus bradycardia, 56, first-degree AV block, lateral T wave inversion- no acute changes.  Lab Results  Component Value Date   WBC 4.2 06/01/2021   HGB 11.7 (L) 06/01/2021   HCT 36.6 (L) 06/01/2021   MCV 102.8 (H) 06/01/2021   PLT 95 (L) 06/01/2021   Lab Results  Component Value Date   CREATININE 1.76 (H) 08/21/2021   BUN 38 (H) 08/21/2021   NA 143 08/21/2021   K 4.1 08/21/2021   CL 110 08/21/2021   CO2 28 08/21/2021   Lab Results  Component Value Date   ALT 53 (H) 05/29/2021   AST 55 (H) 05/29/2021   ALKPHOS 102 05/29/2021   BILITOT 3.2 (H) 05/29/2021   Lab Results  Component Value Date   CHOL 113 03/10/2021   HDL 45.20 03/10/2021   LDLCALC 56 03/10/2021   TRIG 58.0 03/10/2021   CHOLHDL 3 03/10/2021    Lab Results  Component Value Date   HGBA1C 6.2 10/05/2021    Assessment & Plan    1.  Chronic heart failure with reduced ejection fraction/mixed ischemic and nonischemic  cardiomyopathy: Hospitalized in April with volume overload.  Echo showed an EF of 2025% with severe biventricular failure and severe mitral and tricuspid regurgitation, and tricuspid stenosis.  In the setting of noncompliance following hospitalization, he did require he escalation of diuretics though not readmission.  He has been stable since his  July 2023 visit and is euvolemic on examination today.  Current weight is 159 pounds.  He is not routinely weighing himself at home.  He has been off of Entresto due to cost but is willing to try losartan.  I have prescribed losartan 25 mg daily today with plan for follow-up basic metabolic panel in 1 week.  Continue carvedilol and torsemide therapy.  We have previously discussed SGLT2 inhibitor and he feels will be cost prohibitive.  2.  Persistent atrial fibrillation: Diagnosed in April 2023 but maintaining sinus rhythm today.  Continue beta-blocker therapy.  He ran out of Eliquis and cannot afford it.  He prefers to try warfarin.  Stressed the importance compliance with warfarin with regular INR checks.  He will start warfarin 5 mg daily with plan for INR follow-up in 1 week.  3.  Coronary artery disease: Status post circumflex stenting in 2017.  He has not been having any chest pain.  He remains on beta-blocker and statin therapy.  No aspirin in the setting of oral anticoagulation (now being transitioned to warfarin).  4.  Essential hypertension: Blood pressure elevated today in the absence of Entresto therapy.  Adding losartan.  Follow-up basic metabolic panel next week.  New carvedilol and torsemide.  5.  Hyperlipidemia: LDL of 56 in February.  Continue statin therapy.  6.  Stage III chronic kidney disease: Labs stable in July.  Follow-up basic metabolic panel next week as we are starting losartan.  7.  Prolonged QT: QT of 502 with QTc of 484, down from 520/532 respectively at last visit.  8.  Disposition: Follow-up basic metabolic panel in 1 week.   Follow-up in clinic in 6 weeks or sooner if necessary.   Murray Hodgkins, NP 10/10/2021, 6:11 PM

## 2021-10-12 ENCOUNTER — Ambulatory Visit (INDEPENDENT_AMBULATORY_CARE_PROVIDER_SITE_OTHER): Payer: PPO | Admitting: Family Medicine

## 2021-10-12 ENCOUNTER — Telehealth: Payer: Self-pay

## 2021-10-12 ENCOUNTER — Encounter: Payer: Self-pay | Admitting: Family Medicine

## 2021-10-12 VITALS — BP 136/78 | HR 52 | Temp 97.8°F | Ht 69.0 in | Wt 156.6 lb

## 2021-10-12 DIAGNOSIS — R21 Rash and other nonspecific skin eruption: Secondary | ICD-10-CM

## 2021-10-12 DIAGNOSIS — I4819 Other persistent atrial fibrillation: Secondary | ICD-10-CM | POA: Diagnosis not present

## 2021-10-12 DIAGNOSIS — E1159 Type 2 diabetes mellitus with other circulatory complications: Secondary | ICD-10-CM

## 2021-10-12 DIAGNOSIS — I5022 Chronic systolic (congestive) heart failure: Secondary | ICD-10-CM

## 2021-10-12 NOTE — Telephone Encounter (Signed)
-----   Message from Carollee Leitz, MD sent at 10/12/2021 12:27 PM EDT ----- Can you please call patient to let him know that he has to get blood work on 09/13 at 1030 am.  Appointment at the Cardiology office.  Thanks

## 2021-10-12 NOTE — Telephone Encounter (Signed)
LMTCB

## 2021-10-12 NOTE — Patient Instructions (Addendum)
It was a pleasure meeting you today. Thank you for allowing me to take part in your health care.  Our goals for today as we discussed include:  For your rash I'm happy this is improving.  Continue the Lotrisone for another week. I have sent a referral to Dermatology for evaluation   For your recent changes in your medication. I have sent a referral to the pharmacy to see if they can help you qualify for medication financial assistance for the medications Entresto and Eliquis. For now continue the Warfarin and Losartan as prescribed by Cardiology. You will need to have blood work drawn frequently while you are on the Warfarin.  Please follow up with Cardiology to find out when you have to get your lab work drawn.    Please follow-up with PCP October 17 at 9 am  If you have any questions or concerns, please do not hesitate to call the office at [phone].  I look forward to our next visit and until then take care and stay safe.  Regards,   Carollee Leitz, MD   [facility]

## 2021-10-12 NOTE — Progress Notes (Unsigned)
    SUBJECTIVE:   CHIEF COMPLAINT / HPI: follow up rash, review medications  Reports improvement in rash.  Itching has almost resolved.      PERTINENT  PMH / PSH: ***  OBJECTIVE:   BP 136/78 (BP Location: Left Arm, Patient Position: Sitting, Cuff Size: Normal)   Pulse (!) 52   Temp 97.8 F (36.6 C) (Oral)   Ht '5\' 9"'$  (1.753 m)   Wt 156 lb 9.6 oz (71 kg)   SpO2 99%   BMI 23.13 kg/m    General: Alert, no acute distress Cardio: Normal S1 and S2, RRR, no r/m/g Pulm: CTAB, normal work of breathing DERM:        ASSESSMENT/PLAN:   No problem-specific Assessment & Plan notes found for this encounter.     Carollee Leitz, MD

## 2021-10-13 ENCOUNTER — Telehealth: Payer: Self-pay

## 2021-10-13 ENCOUNTER — Telehealth: Payer: Self-pay | Admitting: Pharmacist

## 2021-10-13 NOTE — Telephone Encounter (Signed)
-----   Message from Carollee Leitz, MD sent at 10/12/2021 12:27 PM EDT ----- Can you please call patient to let him know that he has to get blood work on 09/13 at 1030 am.  Appointment at the Cardiology office.  Thanks

## 2021-10-13 NOTE — Telephone Encounter (Signed)
Spoke to Patient to let him know he has blood work at the Cardiologist at 10:30 on 10/18/21. Patient stated thank you for letting me know.

## 2021-10-13 NOTE — Progress Notes (Signed)
[place] [facility])  [place] Team    10/13/2021  [name] 04/02/1945 [id number]  Reason for referral: Medication Assistance  Referral source: Dr. Carollee Leitz Current insurance: [employer]  PMHx includes but not limited to:  Persistent atrial fibrillation, Chronic systolic heart failure   Outreach:  Unsuccessful telephone call with [name].  HIPAA compliant voicemail left for patient to return my call.   Subjective:  Patient is unable to afford Eliquis and Entresto.   Objective: The ASCVD Risk score (Arnett DK, et al., 2019) failed to calculate for the following reasons:   The valid total cholesterol range is 130 to 320 mg/dL  Lab Results  Component Value Date   CREATININE 1.76 (H) 08/21/2021   CREATININE 1.65 (H) 07/19/2021   CREATININE 1.65 (H) 07/12/2021    Lab Results  Component Value Date   HGBA1C 6.2 10/05/2021    Lipid Panel     Component Value Date/Time   CHOL 113 03/10/2021 1111   CHOL 143 08/02/2016 1110   TRIG 58.0 03/10/2021 1111   HDL 45.20 03/10/2021 1111   HDL 69 08/02/2016 1110   CHOLHDL 3 03/10/2021 1111   VLDL 11.6 03/10/2021 1111   LDLCALC 56 03/10/2021 1111   LDLCALC 64 08/02/2016 1110    BP Readings from Last 3 Encounters:  10/12/21 136/78  10/10/21 (!) 148/58  10/05/21 116/66    Allergies  Allergen Reactions   Shellfish Allergy Other (See Comments) and Hives    Pt. instructed by MD to avoid seafood    Plan: Will follow-up in 1 to 3 business days.  [name], PharmD [place] Pharmacist Office: [phone]

## 2021-10-13 NOTE — Telephone Encounter (Signed)
Spoke to the Patient to let him know that he has blood work on 10/18/21 at 10:30 with Cardiology.

## 2021-10-15 ENCOUNTER — Encounter: Payer: Self-pay | Admitting: Family Medicine

## 2021-10-15 DIAGNOSIS — R21 Rash and other nonspecific skin eruption: Secondary | ICD-10-CM | POA: Insufficient documentation

## 2021-10-15 NOTE — Assessment & Plan Note (Signed)
Chronic itching rash for 6 months.  Low suspicion for shingles given no pain or blistering.  Unknown etiology.  Considered fungal or other infectious etiology. -RPR and Varicella titre today -Lotrisone cream BID -Follow up in 1 week, consider Dermatology referral if no improvement

## 2021-10-15 NOTE — Assessment & Plan Note (Addendum)
A1c today.  

## 2021-10-16 NOTE — Progress Notes (Signed)
[place] [facility])                                            [place] Team    10/16/2021  Zavier Canela [birth year] [id number]  Placed second telephonic outreach to Mr. Zalan Shidler. HIPAA compliant voicemail left requesting patient to return my call. Will place third and final outreach attempt in 1 to 3 business days.   Loretha Brasil, PharmD [place] Pharmacist Office: [phone]

## 2021-10-17 ENCOUNTER — Inpatient Hospital Stay: Payer: PPO | Attending: Oncology

## 2021-10-17 DIAGNOSIS — E538 Deficiency of other specified B group vitamins: Secondary | ICD-10-CM | POA: Diagnosis not present

## 2021-10-17 DIAGNOSIS — Z85038 Personal history of other malignant neoplasm of large intestine: Secondary | ICD-10-CM | POA: Insufficient documentation

## 2021-10-17 MED ORDER — CYANOCOBALAMIN 1000 MCG/ML IJ SOLN
1000.0000 ug | INTRAMUSCULAR | Status: DC
  Administered 2021-10-17: 1000 ug via INTRAMUSCULAR
  Filled 2021-10-17: qty 1

## 2021-10-18 ENCOUNTER — Ambulatory Visit: Payer: PPO

## 2021-10-18 ENCOUNTER — Other Ambulatory Visit: Payer: Self-pay

## 2021-10-18 ENCOUNTER — Other Ambulatory Visit
Admission: RE | Admit: 2021-10-18 | Discharge: 2021-10-18 | Disposition: A | Discharge status: Home / Self Care | Payer: PPO | Attending: Cardiovascular Disease | Admitting: Cardiovascular Disease

## 2021-10-18 DIAGNOSIS — I4819 Other persistent atrial fibrillation: Secondary | ICD-10-CM | POA: Insufficient documentation

## 2021-10-18 DIAGNOSIS — N1832 Chronic kidney disease, stage 3b: Secondary | ICD-10-CM | POA: Insufficient documentation

## 2021-10-18 DIAGNOSIS — Z79899 Other long term (current) drug therapy: Secondary | ICD-10-CM | POA: Insufficient documentation

## 2021-10-18 DIAGNOSIS — I255 Ischemic cardiomyopathy: Secondary | ICD-10-CM | POA: Diagnosis not present

## 2021-10-18 DIAGNOSIS — I5022 Chronic systolic (congestive) heart failure: Secondary | ICD-10-CM | POA: Diagnosis not present

## 2021-10-18 LAB — BASIC METABOLIC PANEL
Anion gap: 5 (ref 5–15)
BUN: 21 mg/dL (ref 8–23)
CO2: 24 mmol/L (ref 22–32)
Calcium: 9.4 mg/dL (ref 8.9–10.3)
Chloride: 112 mmol/L — ABNORMAL HIGH (ref 98–111)
Creatinine, Ser: 1.56 mg/dL — ABNORMAL HIGH (ref 0.61–1.24)
GFR, Estimated: 46 mL/min — ABNORMAL LOW (ref >60)
Glucose, Bld: 97 mg/dL (ref 70–99)
Potassium: 4.7 mmol/L (ref 3.5–5.1)
Sodium: 141 mmol/L (ref 135–145)

## 2021-10-19 ENCOUNTER — Telehealth: Payer: Self-pay | Admitting: *Deleted

## 2021-10-19 NOTE — Telephone Encounter (Signed)
-----   Message from Theora Gianotti, NP sent at 10/18/2021  4:47 PM EDT ----- Kidney function and electrolytes are stable.

## 2021-10-19 NOTE — Progress Notes (Signed)
[place] [facility])                                            [place] Team    10/19/2021  [name] 19-Mar-1945 [id number]  Placed third and final telephonic outreach attempt to [name]. Left a HIPAA compliant voicemail for patient to return my call.    Reason for referral: Medication assistance with Eliquis and [facility] pharmacy referral is being closed due to the following reasons:  -Patient was outreached on 10/13/2021, 10/16/2021 and 10/19/2021..  -Will close [facility] pharmacy case as I have been unable to engage or maintain contact with patient -I have provided my contact information if patient or family needs to reach out to me in the future.  -Thank you for allowing [facility] pharmacy to be involved in this patient's care.   [place] is happy to assist the patient/family in the future for clinical pharmacy needs, following a discussion from your team about [place] outreach. Thank you for allowing [facility] to be a part of your patient's care.   Loretha Brasil, PharmD [place] Pharmacist Office: [phone]

## 2021-10-19 NOTE — Telephone Encounter (Signed)
Left voicemail message to call back for review of results.  

## 2021-10-20 ENCOUNTER — Encounter: Payer: Self-pay | Admitting: Family Medicine

## 2021-10-20 NOTE — Assessment & Plan Note (Signed)
Improving with Lotrisone -Continue Lotrisone BID as needed -Referral to Dermatology -Follow up with PCP as scheduled

## 2021-10-20 NOTE — Assessment & Plan Note (Signed)
Last A1c 6.2  Asymptomatic.  Compliant with medication -Continue Metformin 500 mg BID -Follow up with PCP as scheduled

## 2021-10-20 NOTE — Assessment & Plan Note (Signed)
Recently switched  By Cardiology to Warfarin due to financial constraints. -TOC consult for financial assistance -Follow up with Cardiology for future monitoring of INR and Warfarin management -Patient aware of lab appointment scheduled for next week

## 2021-10-20 NOTE — Assessment & Plan Note (Signed)
Euvolemic today.  Recently seen cardiology.  Entresto discontinued as patient not taking secondary to cost. Losartan was initiated. Reports taking all other medications.  -TOC consulted for financial assistance.   -Continue Torsemide 60 mg BID -Continue Carvedilol 3.125 mg BID -Continue Aldactone 25 mg daily -Continue Potassium 40 mEq daily -Follow up with Cardiology as scheduled.   -Follow up with PCP as scheduled

## 2021-10-25 ENCOUNTER — Ambulatory Visit: Payer: PPO | Attending: Cardiovascular Disease

## 2021-10-25 ENCOUNTER — Telehealth: Payer: Self-pay | Admitting: Urology

## 2021-10-25 DIAGNOSIS — Z5181 Encounter for therapeutic drug level monitoring: Secondary | ICD-10-CM | POA: Diagnosis not present

## 2021-10-25 DIAGNOSIS — I4891 Unspecified atrial fibrillation: Secondary | ICD-10-CM

## 2021-10-25 DIAGNOSIS — Z7901 Long term (current) use of anticoagulants: Secondary | ICD-10-CM | POA: Insufficient documentation

## 2021-10-25 LAB — POCT INR: INR: 4.9 — AB (ref 2.0–3.0)

## 2021-10-25 NOTE — Patient Instructions (Signed)
HOLD THURSDAY, FRIDAY and SATURDAY ONLY THEN TAKE 0.5 TABLET DAILY, EXCEPT 1 TABLET MONDAY and FRIDAY.  INR in 2 weeks.  A full discussion of the nature of anticoagulants has been carried out.  A benefit risk analysis has been presented to the patient, so that they understand the justification for choosing anticoagulation at this time. The need for frequent and regular monitoring, precise dosage adjustment and compliance is stressed.  Side effects of potential bleeding are discussed.  The patient should avoid any OTC items containing aspirin or ibuprofen, and should avoid great swings in general diet.  Avoid alcohol consumption.  Call if any signs of abnormal bleeding.  [phone]

## 2021-10-25 NOTE — Telephone Encounter (Signed)
Would you call him and have him come in for a CATH UA to check for micro heme?  He has severe phimosis and the blood could be coming from the foreskin?

## 2021-10-25 NOTE — Telephone Encounter (Signed)
LMOM for patient to return call. 1st attempt

## 2021-10-27 ENCOUNTER — Telehealth: Payer: Self-pay | Admitting: Cardiovascular Disease

## 2021-10-27 NOTE — Telephone Encounter (Signed)
Patient came by office  Patient had new coumadin appt 09/20 States he is unclear on what medication to take and would like clarification Please call to discuss

## 2021-10-27 NOTE — Telephone Encounter (Signed)
Returned a call to the pt and he wanted to know the dose of warfarin/coumadin. Advised pt to continue holding warfarin dose like he did yesterday and hold today's dose of warfarin and take 1/2 tablet on Saturday (instead of not taking) then take the dose you have been instructed to take which is 1/2 tablet daily except 1 tablet on Monday and Friday. Aslo, advised since he is new he needs an appt next week and he and his wife will come next week Wednesday at 230pm.

## 2021-10-30 NOTE — Telephone Encounter (Signed)
Left voicemail message to call back for review of results.  

## 2021-10-31 ENCOUNTER — Encounter: Payer: Self-pay | Admitting: *Deleted

## 2021-10-31 NOTE — Telephone Encounter (Signed)
Left voicemail message to call back for review of results. This is attempt #3 so will mail letter with his results.

## 2021-11-01 ENCOUNTER — Ambulatory Visit: Payer: PPO | Attending: Cardiovascular Disease

## 2021-11-01 DIAGNOSIS — Z5181 Encounter for therapeutic drug level monitoring: Secondary | ICD-10-CM

## 2021-11-01 DIAGNOSIS — I4819 Other persistent atrial fibrillation: Secondary | ICD-10-CM | POA: Diagnosis not present

## 2021-11-01 LAB — POCT INR: INR: 3.8 — AB (ref 2.0–3.0)

## 2021-11-01 NOTE — Patient Instructions (Signed)
HOLD THURSDAY and FRIDAY ONLY THEN TAKE 0.5 TABLET DAILY.  INR in 1 week.   [phone]

## 2021-11-02 ENCOUNTER — Ambulatory Visit: Payer: PPO | Admitting: Dermatology

## 2021-11-02 ENCOUNTER — Encounter: Payer: Self-pay | Admitting: Dermatology

## 2021-11-02 DIAGNOSIS — D485 Neoplasm of uncertain behavior of skin: Secondary | ICD-10-CM

## 2021-11-02 DIAGNOSIS — L905 Scar conditions and fibrosis of skin: Secondary | ICD-10-CM

## 2021-11-02 DIAGNOSIS — D492 Neoplasm of unspecified behavior of bone, soft tissue, and skin: Secondary | ICD-10-CM

## 2021-11-02 DIAGNOSIS — L859 Epidermal thickening, unspecified: Secondary | ICD-10-CM | POA: Diagnosis not present

## 2021-11-02 DIAGNOSIS — B0229 Other postherpetic nervous system involvement: Secondary | ICD-10-CM

## 2021-11-02 NOTE — Patient Instructions (Addendum)
Start: Amitriptyline: 5%, Gabapentin: 10%, Lidocaine: 5% cream twice daily to affected area of shingles  Instructions for [facility] Medications  One or more of your medications was sent to the [facility] mail order compounding pharmacy. You will receive an email from them and can purchase the medicine through that link. It will then be mailed to your home at the address you confirmed. If for any reason you do not receive an email from them, please check your spam folder. If you still do not find the email, please let us know. [facility] phone number is [phone].   Wound Care Instructions  Cleanse wound gently with soap and water once a day then pat dry with clean gauze. Apply a thin coat of Petrolatum (petroleum jelly, "Vaseline") over the wound (unless you have an allergy to this). We recommend that you use a new, sterile tube of Vaseline. Do not pick or remove scabs. Do not remove the yellow or white "healing tissue" from the base of the wound.  Cover the wound with fresh, clean, nonstick gauze and secure with paper tape. You may use Band-Aids in place of gauze and tape if the wound is small enough, but would recommend trimming much of the tape off as there is often too much. Sometimes Band-Aids can irritate the skin.  You should call the office for your biopsy report after 1 week if you have not already been contacted.  If you experience any problems, such as abnormal amounts of bleeding, swelling, significant bruising, significant pain, or evidence of infection, please call the office immediately.  FOR ADULT SURGERY PATIENTS: If you need something for pain relief you may take 1 extra strength Tylenol (acetaminophen) AND 2 Ibuprofen ('200mg'$  each) together every 4 hours as needed for pain. (do not take these if you are allergic to them or if you have a reason you should not take them.) Typically, you may only need pain medication for 1 to 3 days.    Due to recent changes in  healthcare laws, you may see results of your pathology and/or laboratory studies on MyChart before the doctors have had a chance to review them. We understand that in some cases there may be results that are confusing or concerning to you. Please understand that not all results are received at the same time and often the doctors may need to interpret multiple results in order to provide you with the best plan of care or course of treatment. Therefore, we ask that you please give [place] 2 business days to thoroughly review all your results before contacting the office for clarification. Should we see a critical lab result, you will be contacted sooner.   If You Need Anything After Your Visit  If you have any questions or concerns for your doctor, please call our main line at [phone] and press option 4 to reach your doctor's medical assistant. If no one answers, please leave a voicemail as directed and we will return your call as soon as possible. Messages left after 4 pm will be answered the following business day.   You may also send [place] a message via [place]. We typically respond to MyChart messages within 1-2 business days.  For prescription refills, please ask your pharmacy to contact our office. Our fax number is [phone].  If you have an urgent issue when the clinic is closed that cannot wait until the next business day, you can page your doctor at the number below.    Please note  that while we do our best to be available for urgent issues outside of office hours, we are not available 24/7.   If you have an urgent issue and are unable to reach [place], you may choose to seek medical care at your doctor's office, retail clinic, urgent care center, or emergency room.  If you have a medical emergency, please immediately call 911 or go to the emergency department.  Pager Numbers  - Dr. Nehemiah Massed: [phone]  - Dr. Laurence Ferrari: [phone]  - Dr. Nicole Kindred: [phone]  In the event of inclement  weather, please call our main line at [phone] for an update on the status of any delays or closures.  Dermatology Medication Tips: Please keep the boxes that topical medications come in in order to help keep track of the instructions about where and how to use these. Pharmacies typically print the medication instructions only on the boxes and not directly on the medication tubes.   If your medication is too expensive, please contact our office at [phone] option 4 or send [place] a message through [place].   We are unable to tell what your co-pay for medications will be in advance as this is different depending on your insurance coverage. However, we may be able to find a substitute medication at lower cost or fill out paperwork to get insurance to cover a needed medication.   If a prior authorization is required to get your medication covered by your insurance company, please allow [place] 1-2 business days to complete this process.  Drug prices often vary depending on where the prescription is filled and some pharmacies may offer cheaper prices.  The website www.goodrx.com contains coupons for medications through different pharmacies. The prices here do not account for what the cost may be with help from insurance (it may be cheaper with your insurance), but the website can give you the price if you did not use any insurance.  - You can print the associated coupon and take it with your prescription to the pharmacy.  - You may also stop by our office during regular business hours and pick up a GoodRx coupon card.  - If you need your prescription sent electronically to a different pharmacy, notify our office through [facility] or by phone at [phone] option 4.     Si Usted Necesita Algo Despus de Su Visita  Tambin puede enviarnos un mensaje a travs de Pharmacist, community. Por lo general respondemos a los mensajes de MyChart en el transcurso de 1 a 2 das hbiles.  Para renovar recetas,  por favor pida a su farmacia que se ponga en contacto con nuestra oficina. Harland Dingwall de fax es [place] [phone].  Si tiene un asunto urgente cuando la clnica est cerrada y que no puede esperar hasta el siguiente da hbil, puede llamar/localizar a su doctor(a) al nmero que aparece a continuacin.   Por favor, tenga en cuenta que aunque hacemos todo lo posible para estar disponibles para asuntos urgentes fuera del horario de [place], no estamos disponibles las 24 horas del da, los 7 das de la [place].   Si tiene un problema urgente y no puede comunicarse con nosotros, puede optar por buscar atencin mdica  en el consultorio de su doctor(a), en una clnica privada, en un centro de atencin urgente o en una sala de emergencias.  Si tiene Engineering geologist, por favor llame inmediatamente al 911 o vaya a la sala de emergencias.  Nmeros de bper  - Dr. Nehemiah Massed: [phone]  -  DraLaurence Ferrari: [phone]  - Dra. Nicole Kindred: [phone]  En caso de inclemencias del [place], por favor llame a Johnsie Kindred principal al [phone] para una actualizacin sobre el [place] de cualquier retraso o cierre.  Consejos para la medicacin en dermatologa: Por favor, guarde las cajas en las que vienen los medicamentos de uso tpico para ayudarle a seguir las instrucciones sobre dnde y cmo usarlos. Las farmacias generalmente imprimen las instrucciones del medicamento slo en las cajas y no directamente en los tubos del [place].   Si su medicamento es muy caro, por favor, pngase en contacto con Zigmund Daniel llamando al [phone] y presione la opcin 4 o envenos un mensaje a travs de Pharmacist, community.   No podemos decirle cul ser su copago por los medicamentos por adelantado ya que esto es diferente dependiendo de la cobertura de su seguro. Sin embargo, es posible que podamos encontrar un medicamento sustituto a Electrical engineer un formulario para que el seguro cubra el medicamento que se  considera necesario.   Si se requiere una autorizacin previa para que su compaa de seguros [place] su medicamento, por favor permtanos de 1 a 2 das hbiles para completar este proceso.  Los precios de los medicamentos varan con frecuencia dependiendo del Environmental consultant de dnde se surte la receta y alguna farmacias pueden ofrecer precios ms baratos.  El sitio web www.goodrx.com tiene cupones para medicamentos de Airline pilot. Los precios aqu no tienen en cuenta lo que podra costar con la ayuda del seguro (puede ser ms barato con su seguro), pero el sitio web puede darle el precio si no utiliz Research scientist (physical sciences).  - Puede imprimir el cupn correspondiente y llevarlo con su receta a la farmacia.  - Tambin puede pasar por nuestra oficina durante el horario de atencin regular y Charity fundraiser una tarjeta de cupones de GoodRx.  - Si necesita que su receta se enve electrnicamente a una farmacia diferente, informe a nuestra oficina a travs de MyChart de [place] o por telfono llamando al [phone] y presione la opcin 4.

## 2021-11-02 NOTE — Progress Notes (Signed)
   New Patient Visit  Subjective  [name]. is a 76 y.o. male who presents for the following: Rash (Patient states he got shingles in March. PCP prescribed Lotrisone 10/05/2021. Patient reports rash is improving now. PCP did not think was shingles as there were no vesicles at that appointment, previous appointments in March/April refer to vesicles at chest/back. Valacyclovir was prescribed at ED visit 04/14/2021. He is here to have area checked to make sure is healing normally).  Patient does have a history of diabetes mellitus type 2, chronic venous insufficiency, hypertension, chronic kidney disease stage III, congestive heart failure, and coronary artery disease.  Review of Systems: No other skin or systemic complaints except as noted in HPI or Assessment and Plan.   Objective  Well appearing patient in no apparent distress; mood and affect are within normal limits.  All skin waist up examined.  right chest, right back in dermatome pattern Scattered areas of hyper and hypopigmented scarring  Left Dorsum of Nose 2.1 cm scaly brown thin plaque      Assessment & Plan  Post herpetic neuralgia right chest, right back in dermatome pattern  With Scarring  Will improve over time.   Start: prescription compounded Amitriptyline: 5%, Gabapentin: 10%, Lidocaine: 5% cream twice daily to affected area of shingles  Can also try OTC Gold Bond Rapid Relief Anti-Itch cream (pramoxine + menthol) while awaiting the prescription medication  Discussed po gabapentin but deferred today. May consider if topical inadequate  Recommend getting Shingrix vaccine   Neoplasm of skin Left Dorsum of Nose  Skin / nail biopsy Type of biopsy: punch   Informed consent: discussed and consent obtained   Timeout: patient name, date of birth, surgical site, and procedure verified   Procedure prep:  Patient was prepped and draped in usual sterile fashion Prep type:  Isopropyl alcohol Anesthesia:  the lesion was anesthetized in a standard fashion   Anesthesia comment:  Area prepped with alcohol Anesthetic:  1% lidocaine w/ epinephrine 1-100,000 buffered w/ 8.4% NaHCO3 Punch size:  3 mm Suture size:  5-0 Suture type: nylon   Hemostasis achieved with: suture, pressure, aluminum chloride and electrodesiccation   Outcome: patient tolerated procedure well   Post-procedure details: wound care instructions given   Post-procedure details comment:  Ointment and small bandage applied  Specimen 1 - Surgical pathology Differential Diagnosis: R/O SCC  Check Margins: No   Return in about 1 week (around 11/09/2021) for Suture Removal.  I, Emelia [place], CMA, am acting as scribe for Forest Gleason, MD.  Documentation: I have reviewed the above documentation for accuracy and completeness, and I agree with the above.  Forest Gleason, MD

## 2021-11-03 ENCOUNTER — Encounter: Payer: Self-pay | Admitting: Dermatology

## 2021-11-06 ENCOUNTER — Telehealth: Payer: Self-pay

## 2021-11-06 NOTE — Telephone Encounter (Signed)
Advised patient's wife of pathology results. [place] for suture removal.

## 2021-11-06 NOTE — Telephone Encounter (Signed)
-----   Message from Alfonso Patten, MD sent at 11/06/2021  5:09 PM EDT ----- Skin , left dorsum of nose BENIGN EPIDERMAL HYPERPLASIA WITH HYPERKERATOSIS, SEE DESCRIPTION  Benign growth with changes from rubbing. No evidence of cancer   MAs please call. Thank you!

## 2021-11-08 ENCOUNTER — Ambulatory Visit: Payer: PPO | Attending: Cardiovascular Disease

## 2021-11-08 DIAGNOSIS — Z7901 Long term (current) use of anticoagulants: Secondary | ICD-10-CM

## 2021-11-08 DIAGNOSIS — I4819 Other persistent atrial fibrillation: Secondary | ICD-10-CM

## 2021-11-08 LAB — POCT INR: INR: 2.3 (ref 2.0–3.0)

## 2021-11-08 MED ORDER — WARFARIN SODIUM 5 MG PO TABS: ORAL_TABLET | ORAL | 1 refills | Status: DC

## 2021-11-08 NOTE — Patient Instructions (Signed)
Continue 0.5 TABLET DAILY.  INR in 4 weeks.   [phone]

## 2021-11-09 ENCOUNTER — Ambulatory Visit (INDEPENDENT_AMBULATORY_CARE_PROVIDER_SITE_OTHER): Payer: PPO | Admitting: Dermatology

## 2021-11-09 DIAGNOSIS — Z4802 Encounter for removal of sutures: Secondary | ICD-10-CM

## 2021-11-09 NOTE — Progress Notes (Signed)
   Follow-Up Visit   Subjective  [name]. is a 76 y.o. male who presents for the following: Follow-up (Patient here today for suture removal. ).  Patient still having some itching but he has not gotten his rx from [facility] yet. Gave patient the contact information for [facility] and recommended he try Gold Bond Rapid Relief.   The following portions of the chart were reviewed this encounter and updated as appropriate:   Tobacco  Allergies  Meds  Problems  Med Hx  Surg Hx  Fam Hx      Review of Systems:  No other skin or systemic complaints except as noted in HPI or Assessment and Plan.  Objective  Well appearing patient in no apparent distress; mood and affect are within normal limits.  A focused examination was performed including face. Relevant physical exam findings are noted in the Assessment and Plan.    Assessment & Plan   Encounter for Removal of Sutures - Incision site at the left dorsum of nose is clean, dry and intact - Wound cleansed, sutures removed, wound cleansed and steri strips applied.  - Discussed pathology results showing benign epidermal hyperplasia with hyperkeratosis  - Patient advised to keep steri-strips dry until they fall off. - Scars remodel for a full year. - Once steri-strips fall off, patient can apply over-the-counter silicone scar cream each night to help with scar remodeling if desired. - Patient advised to call with any concerns or if they notice any new or changing lesions.  Return if symptoms worsen or fail to improve.  Graciella Belton, RMA, am acting as scribe for Forest Gleason, MD .  Documentation: I have reviewed the above documentation for accuracy and completeness, and I agree with the above.  Forest Gleason, MD

## 2021-11-16 ENCOUNTER — Inpatient Hospital Stay: Payer: PPO | Attending: Oncology

## 2021-11-16 DIAGNOSIS — E538 Deficiency of other specified B group vitamins: Secondary | ICD-10-CM | POA: Insufficient documentation

## 2021-11-16 DIAGNOSIS — Z85038 Personal history of other malignant neoplasm of large intestine: Secondary | ICD-10-CM | POA: Diagnosis not present

## 2021-11-16 MED ORDER — CYANOCOBALAMIN 1000 MCG/ML IJ SOLN
1000.0000 ug | INTRAMUSCULAR | Status: DC
  Administered 2021-11-16: 1000 ug via INTRAMUSCULAR
  Filled 2021-11-16: qty 1

## 2021-11-21 ENCOUNTER — Encounter: Payer: Self-pay | Admitting: Family Medicine

## 2021-11-21 ENCOUNTER — Ambulatory Visit (INDEPENDENT_AMBULATORY_CARE_PROVIDER_SITE_OTHER): Payer: PPO | Admitting: Family Medicine

## 2021-11-21 DIAGNOSIS — I2510 Atherosclerotic heart disease of native coronary artery without angina pectoris: Secondary | ICD-10-CM | POA: Diagnosis not present

## 2021-11-21 DIAGNOSIS — I5022 Chronic systolic (congestive) heart failure: Secondary | ICD-10-CM | POA: Diagnosis not present

## 2021-11-21 DIAGNOSIS — R0989 Other specified symptoms and signs involving the circulatory and respiratory systems: Secondary | ICD-10-CM

## 2021-11-21 DIAGNOSIS — I4819 Other persistent atrial fibrillation: Secondary | ICD-10-CM | POA: Diagnosis not present

## 2021-11-21 DIAGNOSIS — I10 Essential (primary) hypertension: Secondary | ICD-10-CM

## 2021-11-21 DIAGNOSIS — E1159 Type 2 diabetes mellitus with other circulatory complications: Secondary | ICD-10-CM | POA: Diagnosis not present

## 2021-11-21 DIAGNOSIS — B351 Tinea unguium: Secondary | ICD-10-CM

## 2021-11-21 NOTE — Assessment & Plan Note (Signed)
Refer to podiatry for nail management and the callus on his right second toe.

## 2021-11-21 NOTE — Assessment & Plan Note (Signed)
Check ABIs.

## 2021-11-21 NOTE — Assessment & Plan Note (Signed)
Continue risk factor management. 

## 2021-11-21 NOTE — Patient Instructions (Signed)
Nice to see you. You should get a call with in the next week to schedule your blood flow test for your legs and to schedule with the podiatrist.  If you do not hear from [place] or them please let us know.

## 2021-11-21 NOTE — Assessment & Plan Note (Signed)
Well-controlled.  He will continue spironolactone 25 mg daily, torsemide 60 mg twice daily, losartan 25 mg daily, and carvedilol 3.125 mg twice daily.

## 2021-11-21 NOTE — Progress Notes (Signed)
Christopher Rumps, MD Phone: [phone]  [name] [name]. is a 76 y.o. male who presents today for f/u.  HYPERTENSION/CHF/CAD Disease Monitoring Home BP Monitoring not checking Chest pain- no    Dyspnea- no Claudication-no Orthopnea-no  PND-no Medications Compliance-  taking Lipitor, carvedilol, losartan, spironolactone, torsemide.  Edema-no Right upper quadrant pain-no myalgias-no BMET    Component Value Date/Time   NA 141 10/18/2021 1110   NA 145 (H) 08/11/2019 1415   NA 140 05/27/2014 1055   K 4.7 10/18/2021 1110   K 3.9 05/27/2014 1055   CL 112 (H) 10/18/2021 1110   CL 110 05/27/2014 1055   CO2 24 10/18/2021 1110   CO2 25 05/27/2014 1055   GLUCOSE 97 10/18/2021 1110   GLUCOSE 109 (H) 05/27/2014 1055   BUN 21 10/18/2021 1110   BUN 13 08/11/2019 1415   BUN 16 05/27/2014 1055   CREATININE 1.56 (H) 10/18/2021 1110   CREATININE 1.19 05/27/2014 1055   CALCIUM 9.4 10/18/2021 1110   CALCIUM 8.5 (L) 05/27/2014 1055   GFRNONAA 46 (L) 10/18/2021 1110   GFRNONAA >60 05/27/2014 1055   GFRAA 54 (L) 08/19/2019 1252   GFRAA >60 05/27/2014 1055   Atrial fibrillation: Patient recently diagnosed with this.  He has been on Coumadin managed by cardiology.  Last INR was 2.3.  No palpitations or bleeding issues.  DIABETES Disease Monitoring: Blood Sugar ranges-not checking Polyuria/phagia/dipsia- no      Optho- UTD Medications: Compliance- taking metformin Hypoglycemic symptoms- no   Social History   Tobacco Use  Smoking Status Never  Smokeless Tobacco Never    Current Outpatient Medications on File Prior to Visit  Medication Sig Dispense Refill   atorvastatin (LIPITOR) 40 MG tablet Take 1 tablet (40 mg total) by mouth daily. 90 tablet 3   carvedilol (COREG) 3.125 MG tablet Take 1 tablet (3.125 mg total) by mouth 2 (two) times daily with a meal. 180 tablet 3   cholecalciferol (VITAMIN D3) 25 MCG (1000 UNIT) tablet Take 1,000 Units by mouth daily.      clotrimazole-betamethasone (LOTRISONE) cream Apply 1 Application topically 2 (two) times daily. 30 g 0   ELIQUIS 5 MG TABS tablet      ferrous sulfate 325 (65 FE) MG tablet Take 325 mg by mouth daily with breakfast.     gabapentin (NEURONTIN) 100 MG capsule Take 100 mg by mouth 3 (three) times daily. Taking 2 -100 mg pills in am and 1 -100 mg at night     loratadine (CLARITIN) 10 MG tablet Take 1 tablet (10 mg total) by mouth daily. 30 tablet 11   losartan (COZAAR) 25 MG tablet Take 1 tablet (25 mg total) by mouth daily. 30 tablet 6   metFORMIN (GLUCOPHAGE) 500 MG tablet Take 500 mg by mouth 2 (two) times daily with a meal.     potassium chloride SA (KLOR-CON M20) 20 MEQ tablet Take 2 tablets (40 mEq total) by mouth daily. 60 tablet 6   spironolactone (ALDACTONE) 25 MG tablet TAKE 1 TABLET(25 MG) BY MOUTH DAILY 30 tablet 5   warfarin (COUMADIN) 5 MG tablet Take 1 tablet (5 mg) by mouth once daily as directed by the coumadin clinic 50 tablet 1   torsemide (DEMADEX) 20 MG tablet Take 3 tablets (60 mg total) by mouth 2 (two) times daily. Take 4 tablets (80 mg) twice a day for 3 days THEN take 3 tablets (60 mg) twice a day (Patient taking differently: Take 20 mg by mouth daily.) 180 tablet  1   No current facility-administered medications on file prior to visit.     ROS see history of present illness  Objective  Physical Exam Vitals:   11/21/21 0919  BP: (!) 100/59  Pulse: (!) 51  Temp: 97.6 F (36.4 C)  SpO2: 97%    BP Readings from Last 3 Encounters:  11/21/21 (!) 100/59  10/12/21 136/78  10/10/21 (!) 148/58   Wt Readings from Last 3 Encounters:  11/21/21 152 lb 9.6 oz (69.2 kg)  10/12/21 156 lb 9.6 oz (71 kg)  10/10/21 159 lb 9.6 oz (72.4 kg)    Physical Exam Constitutional:      General: He is not in acute distress.    Appearance: He is not diaphoretic.  Cardiovascular:     Rate and Rhythm: Normal rate and regular rhythm.     Heart sounds: Normal heart sounds.   Pulmonary:     Effort: Pulmonary effort is normal.     Breath sounds: Normal breath sounds.  Skin:    General: Skin is warm and dry.  Neurological:     Mental Status: He is alert.    Diabetic Foot Exam - Simple   Simple Foot Form Diabetic Foot exam was performed with the following findings: Yes 11/21/2021  9:25 AM  Visual Inspection See comments: Yes Sensation Testing Intact to touch and monofilament testing bilaterally: Yes Pulse Check See comments: Yes Comments Difficult to palpate DP and PT pulses, right second toe with onychomycosis with slightly detached toenail, callus at the tip of the toe, several other toes bilaterally have onychomycosis, no skin breakdown or ulcerations bilaterally   Bilateral toes are warm   Assessment/Plan: Please see individual problem list.  Problem List Items Addressed This Visit     Atrial fibrillation ([place]) (Chronic)    Patient is on warfarin.  He will continue INR and warfarin management through cardiology.  Patient was counseled on what to do if he has bleeding that he cannot get stopped while on the warfarin.  Advised to go the emergency room if this occurs.      Relevant Medications   ELIQUIS 5 MG TABS tablet   CAD (coronary artery disease) (Chronic)    Continue risk factor management.      Relevant Medications   ELIQUIS 5 MG TABS tablet   Chronic systolic heart failure (HCC) (Chronic)    Euvolemic.  He will continue torsemide 60 mg twice daily, carvedilol 3.125 mg twice daily, spironolactone 25 mg daily and losartan 25 mg daily.  Continue to follow with cardiology.      Relevant Medications   ELIQUIS 5 MG TABS tablet   Diabetes (HCC) (Chronic)    A1c is very well controlled.  He will continue metformin 500 mg twice daily.      Hypertension (Chronic)    Well-controlled.  He will continue spironolactone 25 mg daily, torsemide 60 mg twice daily, losartan 25 mg daily, and carvedilol 3.125 mg twice daily.      Relevant  Medications   ELIQUIS 5 MG TABS tablet   Onychomycosis (Chronic)    Refer to podiatry for nail management and the callus on his right second toe.      Relevant Orders   Ambulatory referral to Podiatry   Absent pedal pulses    Check ABIs.      Relevant Orders   US ARTERIAL ABI (SCREENING LOWER EXTREMITY)      Return in about 6 months (around 05/23/2022).   Christopher Rumps, MD [facility]  [place]

## 2021-11-21 NOTE — Assessment & Plan Note (Addendum)
Patient is on warfarin.  He will continue INR and warfarin management through cardiology.  Patient was counseled on what to do if he has bleeding that he cannot get stopped while on the warfarin.  Advised to go the emergency room if this occurs.

## 2021-11-21 NOTE — Assessment & Plan Note (Signed)
A1c is very well controlled.  He will continue metformin 500 mg twice daily.

## 2021-11-21 NOTE — Assessment & Plan Note (Signed)
Euvolemic.  He will continue torsemide 60 mg twice daily, carvedilol 3.125 mg twice daily, spironolactone 25 mg daily and losartan 25 mg daily.  Continue to follow with cardiology.

## 2021-11-24 ENCOUNTER — Ambulatory Visit
Admission: RE | Admit: 2021-11-24 | Discharge: 2021-11-24 | Disposition: A | Discharge status: Home / Self Care | Payer: PPO | Source: Ambulatory Visit | Attending: Family Medicine | Admitting: Family Medicine

## 2021-11-24 DIAGNOSIS — I743 Embolism and thrombosis of arteries of the lower extremities: Secondary | ICD-10-CM | POA: Diagnosis not present

## 2021-11-24 DIAGNOSIS — I70201 Unspecified atherosclerosis of native arteries of extremities, right leg: Secondary | ICD-10-CM | POA: Diagnosis not present

## 2021-11-24 DIAGNOSIS — R0989 Other specified symptoms and signs involving the circulatory and respiratory systems: Secondary | ICD-10-CM | POA: Diagnosis not present

## 2021-11-28 ENCOUNTER — Ambulatory Visit: Payer: PPO | Admitting: Nurse Practitioner

## 2021-12-05 ENCOUNTER — Other Ambulatory Visit: Payer: Self-pay | Admitting: Family Medicine

## 2021-12-05 DIAGNOSIS — I739 Peripheral vascular disease, unspecified: Secondary | ICD-10-CM

## 2021-12-06 ENCOUNTER — Ambulatory Visit: Payer: PPO | Attending: Cardiovascular Disease

## 2021-12-06 DIAGNOSIS — Z5181 Encounter for therapeutic drug level monitoring: Secondary | ICD-10-CM | POA: Diagnosis not present

## 2021-12-06 DIAGNOSIS — I4819 Other persistent atrial fibrillation: Secondary | ICD-10-CM

## 2021-12-06 LAB — POCT INR: INR: 1.2 — AB (ref 2.0–3.0)

## 2021-12-06 NOTE — Patient Instructions (Signed)
TAKE 1 TABLET TODAY AND TOMORROW ONLY and then Continue 0.5 TABLET DAILY.  INR in 2 weeks.   [phone]

## 2021-12-12 ENCOUNTER — Ambulatory Visit: Payer: PPO | Attending: Nurse Practitioner | Admitting: Nurse Practitioner

## 2021-12-12 ENCOUNTER — Other Ambulatory Visit: Payer: Self-pay | Admitting: *Deleted

## 2021-12-12 ENCOUNTER — Encounter: Payer: Self-pay | Admitting: Nurse Practitioner

## 2021-12-12 ENCOUNTER — Other Ambulatory Visit
Admission: RE | Admit: 2021-12-12 | Discharge: 2021-12-12 | Disposition: A | Discharge status: Home / Self Care | Payer: PPO | Source: Ambulatory Visit | Attending: Nurse Practitioner | Admitting: Nurse Practitioner

## 2021-12-12 VITALS — BP 124/54 | HR 52 | Ht 69.0 in | Wt 154.6 lb

## 2021-12-12 DIAGNOSIS — I5022 Chronic systolic (congestive) heart failure: Secondary | ICD-10-CM

## 2021-12-12 DIAGNOSIS — I429 Cardiomyopathy, unspecified: Secondary | ICD-10-CM

## 2021-12-12 DIAGNOSIS — R9431 Abnormal electrocardiogram [ECG] [EKG]: Secondary | ICD-10-CM

## 2021-12-12 DIAGNOSIS — I4819 Other persistent atrial fibrillation: Secondary | ICD-10-CM | POA: Diagnosis not present

## 2021-12-12 DIAGNOSIS — E785 Hyperlipidemia, unspecified: Secondary | ICD-10-CM | POA: Diagnosis not present

## 2021-12-12 DIAGNOSIS — I10 Essential (primary) hypertension: Secondary | ICD-10-CM | POA: Diagnosis not present

## 2021-12-12 DIAGNOSIS — I2510 Atherosclerotic heart disease of native coronary artery without angina pectoris: Secondary | ICD-10-CM | POA: Diagnosis not present

## 2021-12-12 DIAGNOSIS — N183 Chronic kidney disease, stage 3 unspecified: Secondary | ICD-10-CM | POA: Diagnosis not present

## 2021-12-12 LAB — BASIC METABOLIC PANEL
Anion gap: 4 — ABNORMAL LOW (ref 5–15)
BUN: 21 mg/dL (ref 8–23)
CO2: 26 mmol/L (ref 22–32)
Calcium: 9.8 mg/dL (ref 8.9–10.3)
Chloride: 111 mmol/L (ref 98–111)
Creatinine, Ser: 1.74 mg/dL — ABNORMAL HIGH (ref 0.61–1.24)
GFR, Estimated: 41 mL/min — ABNORMAL LOW (ref >60)
Glucose, Bld: 127 mg/dL — ABNORMAL HIGH (ref 70–99)
Potassium: 4.5 mmol/L (ref 3.5–5.1)
Sodium: 141 mmol/L (ref 135–145)

## 2021-12-12 LAB — MAGNESIUM: Magnesium: 1.7 mg/dL (ref 1.7–2.4)

## 2021-12-12 MED ORDER — MAGNESIUM OXIDE 400 MG PO CAPS: 400.0000 mg | ORAL_CAPSULE | Freq: Every day | ORAL | 3 refills | Status: DC

## 2021-12-12 MED ORDER — TORSEMIDE 20 MG PO TABS: 20.0000 mg | ORAL_TABLET | Freq: Once | ORAL | 3 refills | Status: DC

## 2021-12-12 NOTE — Progress Notes (Signed)
Office Visit    Patient Name: [name]. Date of Encounter: 12/12/2021  Primary Care Provider:  Leone Haven, MD Primary Cardiologist:  Kathlyn Sacramento, MD  Chief Complaint    76 year old male with a history of CAD status post circumflex stenting, chronic HFrEF, mixed ischemic and nonischemic cardiomyopathy, hypertension, hyperlipidemia, stage III chronic kidney disease, and colon cancer, who presents for heart failure follow-up.  Past Medical History    Past Medical History:  Diagnosis Date   Adenocarcinoma of colon ([place]) 06/19/2013   a. moderately differentiated stage IIIc (T4bN2a M0) adenocarcinoma of colon  s/p colectomy followed by chemoRx with oxaliplatin & fluorouacil.   Anemia    CAD (coronary artery disease)    a. 02/2015 Cath: LM nl, LAD 50/40 mid/distal, LCX 80p, RCA 40p, 30d, RPL1 40, CO 3.27, CI 1.65; b. 06/09/15 PCI: LM minor irregs, m-dLAD 50%, dLAD 40%, pLCx 80% (s/p PCI/DES 0%), pRCA 40%, dRCA 30%, 1st RPLB 40%   Chronic kidney disease, stage 3a (HCC)    Chronic systolic CHF (congestive heart failure) ([place])    a. 02/2015 Echo: EF 20-25%, diff HK, mod MR, mildy dil LA, mildly dil RV w/ mod RV syst dysfxn, mildly dil RA, mod TR, PASP 32mHg; b. 09/2017 Echo: EF 40-45%, diff HK. Gr1 DD. Mild to mod MR. Mildly to mod dil LA; c. 09/2019 Echo: EF 25-30%, glob HK, mod reduced RV fxn, sev elev PASP. Mod dil LA. Mild-mod MR; d. 05/2021 Echo: EF 20-25%, glob HK, sev red RV fxn, sev BAE, sev MR/TR/TS, triv AI.   Diabetes mellitus without complication ([facility]    Essential hypertension    Hypertensive heart disease    Left inguinal hernia 05/2020   Mixed Ischemic and Non-ischemic Cardiomyopathy    a. 02/2015 EF 20-25%, diff HK; b. 09/2016 EF 40-45%; c. 09/2019 EF 25-30%; d. 05/2021 Echo: EF 20-25%, glob HK.   Persistent atrial fibrillation ([facility]    a. Dx 05/2021-->CHA2DS2VASc = 5-->eliquis.   Pulmonary hypertension (HCC)    Severe mitral regurgitation    a. 09/2017 Echo:  Mild to mod MR; b. 09/2019 Echo: mild-mod MR; c. 05/2021 Echo: Sev MR/TR/TS.   Tubular adenoma of colon    Past Surgical History:  Procedure Laterality Date   APPENDECTOMY N/A 06/19/2017   [facility] [facility] [place]: WConsuela Mimes MD   CARDIAC CATHETERIZATION Bilateral 02/24/2015   Procedure: Right/Left Heart Cath and Coronary Angiography;  Surgeon: MWellington Hampshire MD;  [facility];  Service: Cardiovascular;  Laterality: Bilateral;   CARDIAC CATHETERIZATION N/A 06/09/2015   Procedure: Coronary Stent Intervention (3.0 x 12 mm Xience Alpine DES to LCx);  Surgeon: MWellington Hampshire MD;  [facility];  Service: Cardiovascular;  Laterality: N/A   COLECTOMY  06/19/2017   Descending and proximal sigmoid colectomy, LEFT ureterolysis, partial cecectomy, appendectomy; [place] [facility] Surgeon: WConsuela Mimes MD   COLONOSCOPY     COLONOSCOPY WITH PROPOFOL N/A 12/11/2016   Procedure: COLONOSCOPY WITH PROPOFOL;  Surgeon: SLollie Sails MD;  [facility];  Service: Endoscopy;  Laterality: N/A;   ESOPHAGOGASTRODUODENOSCOPY     PORT-A-CATH REMOVAL N/A 07/12/2014   Procedure: REMOVAL PORT-A-CATH;  Surgeon: WMolly Maduro MD;  Location: [facility];  Service: General;  Laterality: N/A;   PORTACATH PLACEMENT Left 2015    Allergies  Allergies  Allergen Reactions   Shellfish Allergy Other (See Comments) and Hives    Pt. instructed by MD to avoid seafood  History of Present Illness    76 year old male with above complex past medical history including CAD status post circumflex stenting, chronic HFrEF, mixed ischemic and nonischemic cardiomyopathy, hypertension, hyperlipidemia, stage III chronic kidney disease, and colon cancer.  In the setting of colon cancer, he underwent colectomy in 2015 followed by chemotherapy with oxaliplatin and fluorouracil.  He subsequently developed neuropathy.  In January 2017, he was diagnosed with systolic heart  failure when echo showed an EF of 20 to 25% with moderate MR/TR, and moderate to severe pulmonary hypertension.  Right and left heart cardiac catheterization showed moderate to severe pulmonary hypertension with severely elevated filling pressures and severely reduced cardiac output.  Coronary angiography showed severe one-vessel CAD with an 80 to 90% stenosis in the proximal left circumflex, and mild diffuse disease involving the LAD and RCA.  He underwent staged PCI of the left circumflex in May 2017.  Follow-up echo in November 2020, showed an EF of 30 to 35%.  In the setting of PVCs, outpatient monitoring was performed and showed a 6% PVC burden, which improved with resumption of carvedilol.  Echo in April 2021 showed persistent LV dysfunction with an EF of 25 to 30%, severe PAH, and mild to moderate MR.  He was offered EP referral for ICD therapy however, he declined, and has continued to decline on repeat questioning.  In April 2023, he required admission secondary to massive volume overload and weight gain despite titration of outpatient diuretics.  Echo again showed an EF of 20 to 25% with severely dilated left ventricle, severely reduced RV function, severe biatrial lodgment, severe MR/TR, circumferential pericardial effusion, and trivial AI.  After copious diuresis, he was discharged home at 151 pounds.  He did have significant weight gain in the setting of noncompliance posthospitalization but with resumption of torsemide therapy, this has been well managed, and he has not required repeat admission.  [name] was last seen in cardiology clinic in September 2023, at which time his weight was stable and he was euvolemic.  He again declined EP referral and also declined initiation of SGLT2 inhibitor secondary to cost.  He was unable to afford Entresto but was willing to start losartan.  Over the last 6 weeks or so, he has done well.  His weight is measured at 154.6 pounds today, down 5 pounds from his  last visit.  He has wiggled his torsemide dose down to 20 mg once a day (previously taking 60 mg twice daily).  He notes compliance with his medications and denies chest pain, dyspnea, palpitations, PND, orthopnea, dizziness, syncope, edema, or early satiety.  Home Medications    Current Outpatient Medications  Medication Sig Dispense Refill   atorvastatin (LIPITOR) 40 MG tablet Take 1 tablet (40 mg total) by mouth daily. 90 tablet 3   carvedilol (COREG) 3.125 MG tablet Take 1 tablet (3.125 mg total) by mouth 2 (two) times daily with a meal. 180 tablet 3   cholecalciferol (VITAMIN D3) 25 MCG (1000 UNIT) tablet Take 1,000 Units by mouth daily.     clotrimazole-betamethasone (LOTRISONE) cream Apply 1 Application topically 2 (two) times daily. 30 g 0   ELIQUIS 5 MG TABS tablet      ferrous sulfate 325 (65 FE) MG tablet Take 325 mg by mouth daily with breakfast.     gabapentin (NEURONTIN) 100 MG capsule Take 100 mg by mouth 3 (three) times daily. Taking 2 -100 mg pills in am and 1 -100 mg at night  loratadine (CLARITIN) 10 MG tablet Take 1 tablet (10 mg total) by mouth daily. 30 tablet 11   losartan (COZAAR) 25 MG tablet Take 1 tablet (25 mg total) by mouth daily. 30 tablet 6   metFORMIN (GLUCOPHAGE) 500 MG tablet Take 500 mg by mouth 2 (two) times daily with a meal.     potassium chloride SA (KLOR-CON M20) 20 MEQ tablet Take 2 tablets (40 mEq total) by mouth daily. 60 tablet 6   spironolactone (ALDACTONE) 25 MG tablet TAKE 1 TABLET(25 MG) BY MOUTH DAILY 30 tablet 5   warfarin (COUMADIN) 5 MG tablet Take 1 tablet (5 mg) by mouth once daily as directed by the coumadin clinic 50 tablet 1   torsemide (DEMADEX) 20 MG tablet Take 1 tablet (20 mg total) by mouth once for 1 dose. 90 tablet 3   No current facility-administered medications for this visit.     Review of Systems    He has been feeling well.  He denies chest pain, dyspnea, palpitations, PND, orthopnea, dizziness, syncope, edema, or  early satiety.  All other systems reviewed and are otherwise negative except as noted above.    Physical Exam    VS:  BP (!) 124/54 (BP Location: Left Arm, Patient Position: Sitting, Cuff Size: Normal)   Pulse (!) 52   Ht '5\' 9"'$  (1.753 m)   Wt 154 lb 9.6 oz (70.1 kg)   SpO2 97%   BMI 22.83 kg/m  , BMI Body mass index is 22.83 kg/m.     GEN: Well nourished, well developed, in no acute distress. HEENT: normal. Neck: Supple, no JVD, carotid bruits, or masses. Cardiac: RRR, bradycardic, no murmurs, rubs, or gallops. No clubbing, cyanosis, edema.  Radials/PT 2+ and equal bilaterally.  Respiratory:  Respirations regular and unlabored, clear to auscultation bilaterally. GI: Soft, nontender, nondistended, BS + x 4. MS: no deformity or atrophy. Skin: warm and dry, no rash. Neuro:  Strength and sensation are intact. Psych: Normal affect.  Accessory Clinical Findings    ECG personally reviewed by me today -sinus bradycardia, 52, first-degree AV block, PAC, left atrial enlargement, IVCD, lateral T wave inversion- no acute changes.  Lab Results  Component Value Date   WBC 4.2 06/01/2021   HGB 11.7 (L) 06/01/2021   HCT 36.6 (L) 06/01/2021   MCV 102.8 (H) 06/01/2021   PLT 95 (L) 06/01/2021   Lab Results  Component Value Date   CREATININE 1.74 (H) 12/12/2021   BUN 21 12/12/2021   NA 141 12/12/2021   K 4.5 12/12/2021   CL 111 12/12/2021   CO2 26 12/12/2021   Lab Results  Component Value Date   ALT 53 (H) 05/29/2021   AST 55 (H) 05/29/2021   ALKPHOS 102 05/29/2021   BILITOT 3.2 (H) 05/29/2021   Lab Results  Component Value Date   CHOL 113 03/10/2021   HDL 45.20 03/10/2021   LDLCALC 56 03/10/2021   TRIG 58.0 03/10/2021   CHOLHDL 3 03/10/2021    Lab Results  Component Value Date   HGBA1C 6.2 10/05/2021    Assessment & Plan    1.  Chronic heart failure with reduced ejection fraction/mixed ischemic/nonischemic cardiomyopathy: Hospitalized in April 2023 with volume  overload.  Echo showed an EF of 20-25% with severe biventricular failure and severe mitral and tricuspid regurgitation as well as tricuspid stenosis.  He has done well on oral torsemide therapy and also notes compliance with beta-blocker, ARB, and spironolactone.  He is euvolemic on examination today and his  weight is down 5 pounds since his last visit.  He has scaled back on his torsemide to 20 mg once a day on his own (was taking 60 twice daily previously).  Follow-up basic metabolic panel and magnesium today.  We discussed potential for EP referral and he again declined, reiterating his lack of interest in pursuing an ICD.  He is also not interested in initiating SGLT2 inhibitor as it is cost prohibitive.  He previously could not afford Entresto but has done well with losartan.  He is willing to accept referral to advanced heart failure clinic here in [place] and we will arrange for follow-up with Dr. Haroldine Laws.    2.  Persistent atrial fibrillation: Maintaining sinus rhythm today.  He remains on beta-blocker therapy.  On warfarin because Eliquis was cost prohibitive.  Followed in our anticoagulation clinic.  3.  Coronary artery disease: Status post circumflex stenting in 2017.  He has done well without chest pain.  He remains on beta-blocker and statin therapy.  No aspirin in the setting of warfarin.  4.  Essential hypertension: Blood pressure stable today on beta-blocker, losartan, and spironolactone therapy.  5.  Hyperlipidemia: LDL 50 11 December 2021.  Continue statin therapy.  6.  Stage III chronic kidney disease: Follow-up basic metabolic panel and magnesium today.  7.  Prolonged QT: Stable with QT 526 and QTc of 489 today.  Follow-up basic metabolic panel and magnesium.  8.  Disposition: Follow-up basic metabolic panel and magnesium today.  Follow-up with advanced heart failure clinic in the next 4 to 6 weeks.  Murray Hodgkins, NP 12/12/2021, 12:33 PM

## 2021-12-12 NOTE — Patient Instructions (Signed)
Medication Instructions:  No changes at this time.   *If you need a refill on your cardiac medications before your next appointment, please call your pharmacy*   Lab Work: [facility] [employer] today over at the [facility]. Stop at registration desk.   If you have labs (blood work) drawn today and your tests are completely normal, you will receive your results only by: [place] (if you have MyChart) OR A paper copy in the mail If you have any lab test that is abnormal or we need to change your treatment, we will call you to review the results.   Testing/Procedures: None   Follow-Up: At [facility], you and your health needs are our priority.  As part of our continuing mission to provide you with exceptional heart care, we have created designated Provider Care Teams.  These Care Teams include your primary Cardiologist (physician) and Advanced Practice Providers (APPs -  Physician Assistants and Nurse Practitioners) who all work together to provide you with the care you need, when you need it.  We recommend signing up for the patient portal called "MyChart".  Sign up information is provided on this After Visit Summary.  MyChart is used to connect with patients for Virtual Visits (Telemedicine).  Patients are able to view lab/test results, encounter notes, upcoming appointments, etc.  Non-urgent messages can be sent to your provider as well.   To learn more about what you can do with MyChart, go to [email or web address].    Your next appointment:   6-8 weeks  The format for your next appointment:   In Person  Provider:   Dr. Pierre Bali      Important Information About Sugar

## 2021-12-18 ENCOUNTER — Other Ambulatory Visit: Payer: Self-pay | Admitting: Nurse Practitioner

## 2021-12-18 ENCOUNTER — Inpatient Hospital Stay: Payer: PPO | Attending: Oncology

## 2021-12-19 ENCOUNTER — Ambulatory Visit: Payer: PPO | Admitting: Family

## 2021-12-20 ENCOUNTER — Ambulatory Visit: Payer: PPO | Attending: Cardiovascular Disease

## 2021-12-20 DIAGNOSIS — Z7901 Long term (current) use of anticoagulants: Secondary | ICD-10-CM | POA: Diagnosis not present

## 2021-12-20 DIAGNOSIS — I4819 Other persistent atrial fibrillation: Secondary | ICD-10-CM

## 2021-12-20 LAB — POCT INR: INR: 1.5 — AB (ref 2.0–3.0)

## 2021-12-20 NOTE — Patient Instructions (Signed)
TAKE another 0.5 tablet  and then INCREASE TO 0.5 TABLET DAILY, EXCEPT 1 TABLET ON FRIDAYS.  INR in 2 weeks.   [phone]

## 2022-01-03 ENCOUNTER — Ambulatory Visit: Payer: PPO | Attending: Cardiovascular Disease

## 2022-01-03 DIAGNOSIS — I4819 Other persistent atrial fibrillation: Secondary | ICD-10-CM | POA: Diagnosis not present

## 2022-01-03 DIAGNOSIS — Z7901 Long term (current) use of anticoagulants: Secondary | ICD-10-CM

## 2022-01-03 LAB — POCT INR: INR: 1.8 — AB (ref 2.0–3.0)

## 2022-01-03 NOTE — Patient Instructions (Signed)
TAKE another 0.5 tablet TODAY  and then INCREASE TO  0.5 TABLET DAILY, EXCEPT 1 TABLET ON MONDAYS AND FRIDAYS.  INR in 3 weeks.   [phone]

## 2022-01-16 ENCOUNTER — Inpatient Hospital Stay: Payer: PPO | Attending: Oncology

## 2022-01-16 DIAGNOSIS — E538 Deficiency of other specified B group vitamins: Secondary | ICD-10-CM | POA: Diagnosis not present

## 2022-01-16 DIAGNOSIS — Z85038 Personal history of other malignant neoplasm of large intestine: Secondary | ICD-10-CM | POA: Diagnosis not present

## 2022-01-16 MED ORDER — CYANOCOBALAMIN 1000 MCG/ML IJ SOLN
1000.0000 ug | INTRAMUSCULAR | Status: DC
  Administered 2022-01-16: 1000 ug via INTRAMUSCULAR

## 2022-01-19 ENCOUNTER — Other Ambulatory Visit (HOSPITAL_COMMUNITY): Payer: Self-pay

## 2022-01-22 DIAGNOSIS — N1832 Chronic kidney disease, stage 3b: Secondary | ICD-10-CM | POA: Diagnosis not present

## 2022-01-22 DIAGNOSIS — E1122 Type 2 diabetes mellitus with diabetic chronic kidney disease: Secondary | ICD-10-CM | POA: Diagnosis not present

## 2022-01-22 DIAGNOSIS — N2581 Secondary hyperparathyroidism of renal origin: Secondary | ICD-10-CM | POA: Diagnosis not present

## 2022-01-22 DIAGNOSIS — R809 Proteinuria, unspecified: Secondary | ICD-10-CM | POA: Diagnosis not present

## 2022-01-22 DIAGNOSIS — I10 Essential (primary) hypertension: Secondary | ICD-10-CM | POA: Diagnosis not present

## 2022-01-23 ENCOUNTER — Encounter: Payer: Self-pay | Admitting: Internal Medicine

## 2022-01-23 ENCOUNTER — Ambulatory Visit: Payer: PPO | Attending: Internal Medicine | Admitting: Internal Medicine

## 2022-01-23 VITALS — BP 108/64 | HR 51 | Resp 16 | Wt 158.1 lb

## 2022-01-23 DIAGNOSIS — I5022 Chronic systolic (congestive) heart failure: Secondary | ICD-10-CM | POA: Diagnosis not present

## 2022-01-23 DIAGNOSIS — I4891 Unspecified atrial fibrillation: Secondary | ICD-10-CM

## 2022-01-23 DIAGNOSIS — I429 Cardiomyopathy, unspecified: Secondary | ICD-10-CM | POA: Diagnosis not present

## 2022-01-23 DIAGNOSIS — N183 Chronic kidney disease, stage 3 unspecified: Secondary | ICD-10-CM | POA: Diagnosis not present

## 2022-01-23 DIAGNOSIS — I10 Essential (primary) hypertension: Secondary | ICD-10-CM | POA: Diagnosis not present

## 2022-01-23 NOTE — Progress Notes (Addendum)
ADVANCED HF CLINIC CONSULT NOTE  Referring Physician: Ignacia Bayley, NP Primary Care: Leone Haven, MD Primary Cardiologist: Kathlyn Sacramento, MD   HPI:  Mr. Hulse is a 76 year old male with a history of CAD status post circumflex stenting, chronic HFrEF, mixed ischemic and nonischemic cardiomyopathy, hypertension, hyperlipidemia, stage III chronic kidney disease, and colon cancer, who presents for heart failure follow-up.   Found to have colon CA in 2015. Underwent colectomy by chemotherapy with oxaliplatin and fluorouracil.   In January 2017, he was diagnosed with systolic heart failure. Echo EF 20-25% with moderate MR/TR, and moderate to severe pulmonary hypertension.  Right and left heart cardiac catheterization showed moderate to severe pulmonary hypertension with severely elevated filling pressures and severely reduced cardiac output.  Coronary angiography showed severe one-vessel CAD with an 80 to 90% stenosis in the proximal left circumflex, and mild diffuse disease involving the LAD and RCA.  He underwent PCI of the left circumflex in May 2017.  Follow-up echo in November 2020, showed an EF of 30 to 35%.  In the setting of PVCs, outpatient monitoring was performed and showed a 6% PVC burden, which improved with resumption of carvedilol.    RHC 1/17  RA = 9  PA = 70/24 (40) PCWP = 30  (v = 35) Fick CO/CI = 3.3/1.3 PVR = 3.0 WU  Echo in April 2021 showed persistent LV dysfunction with an EF of 25-30%, severe PAH, and mild to moderate MR.  He was offered EP referral for ICD therapy however, he declined. In April 2023, he required admission secondary to massive volume overload and weight gain despite titration of outpatient diuretics.  Echo again showed an EF of 20-25% with severely dilated left ventricle, severely reduced RV function, severe biatrial lodgment, severe MR/TR, circumferential pericardial effusion, and trivial AI.  After copious diuresis, he was discharged home at 151  pounds.    Mr. Ballo was last seen in cardiology clinic in September 2023, at which time his weight was stable and he was euvolemic.  He again declined EP referral and also declined initiation of SGLT2 inhibitor secondary to cost.  He was unable to afford Entresto but was willing to start losartan  Lives with his wife. Says he feels good. Remains fairly active. Able to push mow the ward. No CP. Occasional LE edema. Compliant with meds.   FHx: Mother died when was 24 months old Unsure how his Dad passed 2 half brothers - he doesn't know them Doesn't know much about his family 4 kids - 82 grandkids   Review of Systems: [y] = yes, '[ ]'$  = no   General: Weight gain '[ ]'$ ; Weight loss '[ ]'$ ; Anorexia '[ ]'$ ; Fatigue '[ ]'$ ; Fever '[ ]'$ ; Chills '[ ]'$ ; Weakness '[ ]'$   Cardiac: Chest pain/pressure '[ ]'$ ; Resting SOB '[ ]'$ ; Exertional SOB '[ ]'$ ; Orthopnea '[ ]'$ ; Pedal Edema [ y]; Palpitations '[ ]'$ ; Syncope '[ ]'$ ; Presyncope '[ ]'$ ; Paroxysmal nocturnal dyspnea'[ ]'$   Pulmonary: Cough '[ ]'$ ; Wheezing'[ ]'$ ; Hemoptysis'[ ]'$ ; Sputum '[ ]'$ ; Snoring '[ ]'$   GI: Vomiting'[ ]'$ ; Dysphagia'[ ]'$ ; Melena'[ ]'$ ; Hematochezia '[ ]'$ ; Heartburn'[ ]'$ ; Abdominal pain '[ ]'$ ; Constipation '[ ]'$ ; Diarrhea '[ ]'$ ; BRBPR '[ ]'$   GU: Hematuria'[ ]'$ ; Dysuria '[ ]'$ ; Nocturia'[ ]'$   Vascular: Pain in legs with walking '[ ]'$ ; Pain in feet with lying flat '[ ]'$ ; Non-healing sores '[ ]'$ ; Stroke '[ ]'$ ; TIA '[ ]'$ ; Slurred speech '[ ]'$ ;  Neuro: Headaches'[ ]'$ ; Vertigo'[ ]'$ ; Seizures'[ ]'$ ; Paresthesias'[ ]'$ ;Blurred  vision '[ ]'$ ; Diplopia '[ ]'$ ; Vision changes '[ ]'$   Ortho/Skin: Arthritis '[ ]'$ ; Joint pain '[ ]'$ ; Muscle pain '[ ]'$ ; Joint swelling '[ ]'$ ; Back Pain '[ ]'$ ; Rash '[ ]'$   Psych: Depression'[ ]'$ ; Anxiety'[ ]'$   Heme: Bleeding problems '[ ]'$ ; Clotting disorders '[ ]'$ ; Anemia '[ ]'$   Endocrine: Diabetes '[ ]'$ ; Thyroid dysfunction'[ ]'$    Past Medical History:  Diagnosis Date   Adenocarcinoma of colon ([place]) 06/19/2013   a. moderately differentiated stage IIIc (T4bN2a M0) adenocarcinoma of colon  s/p colectomy followed by chemoRx with oxaliplatin &  fluorouacil.   Anemia    CAD (coronary artery disease)    a. 02/2015 Cath: LM nl, LAD 50/40 mid/distal, LCX 80p, RCA 40p, 30d, RPL1 40, CO 3.27, CI 1.65; b. 06/09/15 PCI: LM minor irregs, m-dLAD 50%, dLAD 40%, pLCx 80% (s/p PCI/DES 0%), pRCA 40%, dRCA 30%, 1st RPLB 40%   Chronic kidney disease, stage 3a (HCC)    Chronic systolic CHF (congestive heart failure) ([place])    a. 02/2015 Echo: EF 20-25%, diff HK, mod MR, mildy dil LA, mildly dil RV w/ mod RV syst dysfxn, mildly dil RA, mod TR, PASP 50mHg; b. 09/2017 Echo: EF 40-45%, diff HK. Gr1 DD. Mild to mod MR. Mildly to mod dil LA; c. 09/2019 Echo: EF 25-30%, glob HK, mod reduced RV fxn, sev elev PASP. Mod dil LA. Mild-mod MR; d. 05/2021 Echo: EF 20-25%, glob HK, sev red RV fxn, sev BAE, sev MR/TR/TS, triv AI.   Diabetes mellitus without complication ([place]    Essential hypertension    Hypertensive heart disease    Left inguinal hernia 05/2020   Mixed Ischemic and Non-ischemic Cardiomyopathy    a. 02/2015 EF 20-25%, diff HK; b. 09/2016 EF 40-45%; c. 09/2019 EF 25-30%; d. 05/2021 Echo: EF 20-25%, glob HK.   Persistent atrial fibrillation ([place]    a. Dx 05/2021-->CHA2DS2VASc = 5-->eliquis.   Pulmonary hypertension (HCC)    Severe mitral regurgitation    a. 09/2017 Echo: Mild to mod MR; b. 09/2019 Echo: mild-mod MR; c. 05/2021 Echo: Sev MR/TR/TS.   Tubular adenoma of colon     Current Outpatient Medications  Medication Sig Dispense Refill   atorvastatin (LIPITOR) 40 MG tablet Take 1 tablet (40 mg total) by mouth daily. 90 tablet 3   carvedilol (COREG) 3.125 MG tablet Take 1 tablet (3.125 mg total) by mouth 2 (two) times daily with a meal. 180 tablet 3   cholecalciferol (VITAMIN D3) 25 MCG (1000 UNIT) tablet Take 1,000 Units by mouth daily.     ELIQUIS 5 MG TABS tablet Take 5 mg by mouth 2 (two) times daily.     ferrous sulfate 325 (65 FE) MG tablet Take 325 mg by mouth daily with breakfast.     gabapentin (NEURONTIN) 100 MG capsule Take 100 mg by mouth 3  (three) times daily. Taking 2 -100 mg pills in am and 1 -100 mg at night     loratadine (CLARITIN) 10 MG tablet Take 1 tablet (10 mg total) by mouth daily. 30 tablet 11   losartan (COZAAR) 25 MG tablet Take 1 tablet (25 mg total) by mouth daily. 30 tablet 6   Magnesium Oxide 400 MG CAPS Take 1 capsule (400 mg total) by mouth daily. 90 capsule 3   metFORMIN (GLUCOPHAGE) 500 MG tablet Take 500 mg by mouth 2 (two) times daily with a meal.     potassium chloride SA (KLOR-CON M20) 20 MEQ tablet Take 2 tablets (40 mEq total) by  mouth daily. 60 tablet 6   spironolactone (ALDACTONE) 25 MG tablet TAKE 1 TABLET(25 MG) BY MOUTH DAILY 30 tablet 5   torsemide (DEMADEX) 20 MG tablet Take 20 mg by mouth daily.     warfarin (COUMADIN) 5 MG tablet Take 1 tablet (5 mg) by mouth once daily as directed by the coumadin clinic 50 tablet 1   No current facility-administered medications for this visit.    Allergies  Allergen Reactions   Shellfish Allergy Other (See Comments) and Hives    Pt. instructed by MD to avoid seafood      Social History   Socioeconomic History   Marital status: Married    Spouse name: Romie Minus   Number of children: 4   Years of education: Not on file   Highest education level: Not on file  Occupational History   Occupation: retired  Tobacco Use   Smoking status: Never   Smokeless tobacco: Never  Vaping Use   Vaping Use: Never used  Substance and Sexual Activity   Alcohol use: Not Currently    Comment: none in many years   Drug use: No   Sexual activity: Yes  Other Topics Concern   Not on file  Social History Narrative   Not on file   Social Determinants of Health   Financial Resource Strain: Low Risk  (10/02/2021)   Overall Financial Resource Strain (CARDIA)    Difficulty of Paying Living Expenses: Not hard at all  Food Insecurity: No Food Insecurity (10/02/2021)   Hunger Vital Sign    Worried About Running Out of Food in the Last Year: Never true    [place] in  the Last Year: Never true  Transportation Needs: No Transportation Needs (10/02/2021)   PRAPARE - Hydrologist (Medical): No    Lack of Transportation (Non-Medical): No  Physical Activity: Not on file  Stress: No Stress Concern Present (10/02/2021)   [place]    Feeling of Stress : Not at all  Social Connections: Unknown (10/02/2021)   Social Connection and Isolation Panel [NHANES]    Frequency of Communication with Friends and Family: Not on file    Frequency of Social Gatherings with Friends and Family: Not on file    Attends Religious Services: Not on file    Active Member of Clubs or Organizations: Not on file    Attends Archivist Meetings: Not on file    Marital Status: Married  Intimate Partner Violence: Not At Risk (10/02/2021)   Humiliation, Afraid, Rape, and Kick questionnaire    Fear of Current or Ex-Partner: No    Emotionally Abused: No    Physically Abused: No    Sexually Abused: No      Family History  Problem Relation Age of Onset   Cancer Mother     Vitals:   01/23/22 1102  BP: 108/64  Pulse: (!) 51  Resp: 16  SpO2: 100%  Weight: 158 lb 2 oz (71.7 kg)    PHYSICAL EXAM: General:  Thin male Well appearing. No respiratory difficulty HEENT: normal poor dentition Neck: supple. no JVD. Carotids 2+ bilat; no bruits. No lymphadenopathy or thryomegaly appreciated. Cor: PMI nondisplaced. Regular brady No rubs, gallops or murmurs. Lungs: clear Abdomen: soft, nontender, nondistended. No hepatosplenomegaly. No bruits or masses. Good bowel sounds. Extremities: no cyanosis, clubbing, rash, edema Neuro: alert & oriented x 3, cranial nerves grossly intact. moves all 4 extremities w/o  difficulty. Affect pleasant.  ECG: Sinus brady 53 1AVB (271m) IVC (1072m diffuse repol abnormalities Personally reviewed   ASSESSMENT & PLAN:   1.  Chronic systolic HF due to mixed  NICM/ICM - primarily NICM - Onset 1/17. Echo EF 20-25% with moderate MR/TR - Cath 1/17: severe 1v CAD with 80-90% stenosis in the proximal left circumflex -> PCI, and mild diffuse disease inLAD and RCA.  - Echo 11/20 EF 30-35%.   - Monitor with 6% PVC burden,  - Echo 4/23 EF 20-25% with severely dilated left ventricle, severely reduced RV function, severe biatrial lodgment, severe MR/TR, circumferential pericardial effusion, and trivial AI.   - NYHA II - Volume status ok on torsemide 20 daily - Refuses ICD - Continue losartan 25 daily (refused Entresto due to cost) - Continue carvedilol 3.125 bid - unable to titrate due to bradycardia - Continue spiro 25 daily - Refuses SGLT2i due to cost - I have reviewed echo images personally and echos raise concern for non-compaction. -> will check cMRI  2.  Persistent atrial fibrillation:  - Maintaining sinus rhythm today. - On warfarin because Eliquis was cost prohibitive.     3.  Coronary artery disease:  - Status post circumflex stenting in 2017. -  No aspirin in the setting of warfarin.   4.  Essential hypertension:  - Blood pressure well controlled. Continue current regimen.   5.  Stage IIIB chronic kidney disease:  - Scr ~1.7 - stable on recent labs - refuses SGLT2i. Continue ARB  DaGlori BickersMD  4:10 PM

## 2022-01-23 NOTE — Patient Instructions (Signed)
Medication Changes:  No changes. Continue to take your medication as prescribed  Lab Work:  No lab work today  Testing/Procedures:  Your physician has requested that you have a cardiac MRI. Cardiac MRI uses a computer to create images of your heart as its beating, producing both still and moving pictures of your heart and major blood vessels. For further information please visit [email or web address] Please follow the instruction sheet given to you today for more information.  We will call you to schedule this test after we have pre authorization from your insurance.   Referrals:  None  Special Instructions // Education:  Do the following things EVERYDAY: Weigh yourself in the morning before breakfast. Write it down and keep it in a log. Take your medicines as prescribed Eat low salt foods--Limit salt (sodium) to 2000 mg per day.  Stay as active as you can everyday Limit all fluids for the day to less than 2 liters   Follow-Up in: 2-3 months with Dr. Haroldine Laws. We will call you to schedule this appointment closer to the date.     If you have any questions or concerns before your next appointment please send [place] a message through [place] or call our office at [phone] Monday-Friday 8 am-5 pm.   If you have an urgent need after hours on the weekend please call your Primary Cardiologist or the [facility] in [place] at [phone].

## 2022-01-24 ENCOUNTER — Encounter: Payer: Self-pay | Admitting: Dermatology

## 2022-01-24 ENCOUNTER — Ambulatory Visit: Payer: PPO | Attending: Cardiovascular Disease

## 2022-01-24 DIAGNOSIS — I4819 Other persistent atrial fibrillation: Secondary | ICD-10-CM

## 2022-01-24 DIAGNOSIS — Z7901 Long term (current) use of anticoagulants: Secondary | ICD-10-CM | POA: Diagnosis not present

## 2022-01-24 DIAGNOSIS — Z5181 Encounter for therapeutic drug level monitoring: Secondary | ICD-10-CM | POA: Diagnosis not present

## 2022-01-24 LAB — POCT INR: INR: 2 (ref 2.0–3.0)

## 2022-01-24 NOTE — Patient Instructions (Signed)
Continue  0.5 TABLET DAILY, EXCEPT 1 TABLET ON MONDAYS AND FRIDAYS.  INR in 6 weeks.   [phone]

## 2022-01-24 NOTE — Addendum Note (Signed)
Addended by: Alfonso Patten on: 01/24/2022 11:40 AM   Modules accepted: Level of Service

## 2022-02-16 ENCOUNTER — Inpatient Hospital Stay: Payer: PPO | Attending: Oncology

## 2022-03-07 ENCOUNTER — Ambulatory Visit: Payer: PPO | Attending: Cardiovascular Disease

## 2022-03-07 DIAGNOSIS — Z7901 Long term (current) use of anticoagulants: Secondary | ICD-10-CM

## 2022-03-07 DIAGNOSIS — Z5181 Encounter for therapeutic drug level monitoring: Secondary | ICD-10-CM | POA: Diagnosis not present

## 2022-03-07 DIAGNOSIS — I4819 Other persistent atrial fibrillation: Secondary | ICD-10-CM

## 2022-03-07 LAB — POCT INR: INR: 3 (ref 2.0–3.0)

## 2022-03-07 NOTE — Patient Instructions (Signed)
Continue  0.5 TABLET DAILY, EXCEPT 1 TABLET ON MONDAYS AND FRIDAYS.  INR in 6 weeks.   [phone]

## 2022-03-13 ENCOUNTER — Other Ambulatory Visit: Payer: Self-pay | Admitting: Family

## 2022-03-16 MED FILL — Iron Sucrose Inj 20 MG/ML (Fe Equiv): INTRAVENOUS | Qty: 10 | Status: AC

## 2022-03-19 ENCOUNTER — Inpatient Hospital Stay: Payer: PPO | Attending: Oncology

## 2022-03-19 DIAGNOSIS — E538 Deficiency of other specified B group vitamins: Secondary | ICD-10-CM | POA: Diagnosis not present

## 2022-03-19 DIAGNOSIS — Z85038 Personal history of other malignant neoplasm of large intestine: Secondary | ICD-10-CM | POA: Diagnosis not present

## 2022-03-19 MED ORDER — CYANOCOBALAMIN 1000 MCG/ML IJ SOLN
1000.0000 ug | INTRAMUSCULAR | Status: DC
  Administered 2022-03-19: 1000 ug via INTRAMUSCULAR
  Filled 2022-03-19: qty 1

## 2022-03-21 ENCOUNTER — Other Ambulatory Visit (INDEPENDENT_AMBULATORY_CARE_PROVIDER_SITE_OTHER): Payer: Self-pay | Admitting: Nurse Practitioner

## 2022-03-21 DIAGNOSIS — I70219 Atherosclerosis of native arteries of extremities with intermittent claudication, unspecified extremity: Secondary | ICD-10-CM | POA: Insufficient documentation

## 2022-03-21 DIAGNOSIS — I739 Peripheral vascular disease, unspecified: Secondary | ICD-10-CM

## 2022-03-21 NOTE — Progress Notes (Signed)
MRN : [id number]  [name]. is a 77 y.o. (Apr 22, 1945) male who presents with chief complaint of check circulation.  History of Present Illness:    The patient is seen for evaluation of painful lower extremities and diminished pulses. Patient notes the pain is always associated with activity and is very consistent day today. Typically, the pain occurs at less than one block, progress is as activity continues to the point that the patient must stop walking. Resting including standing still for several minutes allows the patient to walk a similar distance before being forced to stop again. Uneven terrain and inclines shorten the distance. The pain has been progressive over the past several years. The patient denies any abrupt changes in claudication symptoms.  The patient states the inability to walk is causing problems with daily activities.  The patient denies rest pain or dangling of an extremity off the side of the bed during the night for relief. No open wounds or sores at this time. No prior interventions or surgeries.  No history of back problems or DJD of the lumbar sacral spine.   The patient's blood pressure has been stable and relatively well controlled. The patient denies amaurosis fugax or recent TIA symptoms. There are no recent neurological changes noted. The patient denies history of DVT, PE or superficial thrombophlebitis. The patient denies recent episodes of angina or shortness of breath.   ABI's Rt=0.65 and Lt=0.99  No outpatient medications have been marked as taking for the 03/22/22 encounter (Appointment) with Delana Meyer, Dolores Lory, MD.    Past Medical History:  Diagnosis Date   Adenocarcinoma of colon ([place]) 06/19/2013   a. moderately differentiated stage IIIc (T4bN2a M0) adenocarcinoma of colon  s/p colectomy followed by chemoRx with oxaliplatin & fluorouacil.   Anemia    CAD (coronary artery disease)    a. 02/2015 Cath: LM nl, LAD 50/40  mid/distal, LCX 80p, RCA 40p, 30d, RPL1 40, CO 3.27, CI 1.65; b. 06/09/15 PCI: LM minor irregs, m-dLAD 50%, dLAD 40%, pLCx 80% (s/p PCI/DES 0%), pRCA 40%, dRCA 30%, 1st RPLB 40%   Chronic kidney disease, stage 3a (HCC)    Chronic systolic CHF (congestive heart failure) ([place])    a. 02/2015 Echo: EF 20-25%, diff HK, mod MR, mildy dil LA, mildly dil RV w/ mod RV syst dysfxn, mildly dil RA, mod TR, PASP 40mHg; b. 09/2017 Echo: EF 40-45%, diff HK. Gr1 DD. Mild to mod MR. Mildly to mod dil LA; c. 09/2019 Echo: EF 25-30%, glob HK, mod reduced RV fxn, sev elev PASP. Mod dil LA. Mild-mod MR; d. 05/2021 Echo: EF 20-25%, glob HK, sev red RV fxn, sev BAE, sev MR/TR/TS, triv AI.   Diabetes mellitus without complication ([facility]    Essential hypertension    Hypertensive heart disease    Left inguinal hernia 05/2020   Mixed Ischemic and Non-ischemic Cardiomyopathy    a. 02/2015 EF 20-25%, diff HK; b. 09/2016 EF 40-45%; c. 09/2019 EF 25-30%; d. 05/2021 Echo: EF 20-25%, glob HK.   Persistent atrial fibrillation ([place]    a. Dx 05/2021-->CHA2DS2VASc = 5-->eliquis.   Pulmonary hypertension (HCC)    Severe mitral regurgitation    a. 09/2017 Echo: Mild to mod MR; b. 09/2019 Echo: mild-mod MR; c. 05/2021 Echo: Sev MR/TR/TS.   Tubular adenoma of colon     Past Surgical History:  Procedure Laterality Date   APPENDECTOMY N/A 06/19/2017  Location: [facility]; Surgeon: Consuela Mimes, MD   CARDIAC CATHETERIZATION Bilateral 02/24/2015   Procedure: Right/Left Heart Cath and Coronary Angiography;  Surgeon: Wellington Hampshire, MD;  Location: [facility];  Service: Cardiovascular;  Laterality: Bilateral;   CARDIAC CATHETERIZATION N/A 06/09/2015   Procedure: Coronary Stent Intervention (3.0 x 12 mm Xience [place] DES to LCx);  Surgeon: Wellington Hampshire, MD;  Location: [facility];  Service: Cardiovascular;  Laterality: N/A   COLECTOMY  06/19/2017   Descending and proximal sigmoid colectomy, LEFT ureterolysis, partial  cecectomy, appendectomy; Location: [facility]; Surgeon: Consuela Mimes, MD   COLONOSCOPY     COLONOSCOPY WITH PROPOFOL N/A 12/11/2016   Procedure: COLONOSCOPY WITH PROPOFOL;  Surgeon: Lollie Sails, MD;  Location[facility] END[facility];  Service: Endoscopy;  Laterality: N/A;   ESOPHAGOGASTRODUODENOSCOPY     PORT-A-CATH REMOVAL N/A 07/12/2014   Procedure: REMOVAL PORT-A-CATH;  Surgeon: Molly Maduro, MD;  Location: [facility];  Service: General;  Laterality: N/A;   PORTACATH PLACEMENT Left 2015    Social History Social History   Tobacco Use   Smoking status: Never   Smokeless tobacco: Never  Vaping Use   Vaping Use: Never used  Substance Use Topics   Alcohol use: Not Currently    Comment: none in many years   Drug use: No    Family History Family History  Problem Relation Age of Onset   Cancer Mother     Allergies  Allergen Reactions   Shellfish Allergy Other (See Comments) and Hives    Pt. instructed by MD to avoid seafood     REVIEW OF SYSTEMS (Negative unless checked)  Constitutional: '[]'$ Weight loss  '[]'$ Fever  '[]'$ Chills Cardiac: '[]'$ Chest pain   '[]'$ Chest pressure   '[]'$ Palpitations   '[]'$ Shortness of breath when laying flat   '[]'$ Shortness of breath with exertion. Vascular:  '[x]'$ Pain in legs with walking   '[]'$ Pain in legs at rest  '[]'$ History of DVT   '[]'$ Phlebitis   '[]'$ Swelling in legs   '[]'$ Varicose veins   '[]'$ Non-healing ulcers Pulmonary:   '[]'$ Uses home oxygen   '[]'$ Productive cough   '[]'$ Hemoptysis   '[]'$ Wheeze  '[]'$ COPD   '[]'$ Asthma Neurologic:  '[]'$ Dizziness   '[]'$ Seizures   '[]'$ History of stroke   '[]'$ History of TIA  '[]'$ Aphasia   '[]'$ Vissual changes   '[]'$ Weakness or numbness in arm   '[]'$ Weakness or numbness in leg Musculoskeletal:   '[]'$ Joint swelling   '[]'$ Joint pain   '[]'$ Low back pain Hematologic:  '[]'$ Easy bruising  '[]'$ Easy bleeding   '[]'$ Hypercoagulable state   '[]'$ Anemic Gastrointestinal:  '[]'$ Diarrhea   '[]'$ Vomiting  '[]'$ Gastroesophageal reflux/heartburn   '[]'$ Difficulty swallowing. Genitourinary:  '[]'$ Chronic kidney  disease   '[]'$ Difficult urination  '[]'$ Frequent urination   '[]'$ Blood in urine Skin:  '[]'$ Rashes   '[]'$ Ulcers  Psychological:  '[]'$ History of anxiety   '[]'$  History of major depression.  Physical Examination  There were no vitals filed for this visit. There is no height or weight on file to calculate BMI. Gen: WD/WN, NAD Head: [place]/AT, No temporalis wasting.  Ear/Nose/Throat: Hearing grossly intact, nares w/o erythema or drainage Eyes: PER, EOMI, sclera nonicteric.  Neck: Supple, no masses.  No bruit or JVD.  Pulmonary:  Good air movement, no audible wheezing, no use of accessory muscles.  Cardiac: RRR, normal S1, S2, no Murmurs. Vascular:  mild trophic changes, no open wounds Vessel Right Left  Radial Palpable Palpable  PT Not Palpable Not Palpable  DP Not Palpable Not Palpable  Gastrointestinal: soft, non-distended. No guarding/no peritoneal signs.  Musculoskeletal: M/S  5/5 throughout.  No visible deformity.  Neurologic: CN 2-12 intact. Pain and light touch intact in extremities.  Symmetrical.  Speech is fluent. Motor exam as listed above. Psychiatric: Judgment intact, Mood & affect appropriate for pt's clinical situation. Dermatologic: No rashes or ulcers noted.  No changes consistent with cellulitis.   CBC Lab Results  Component Value Date   WBC 4.2 06/01/2021   HGB 11.7 (L) 06/01/2021   HCT 36.6 (L) 06/01/2021   MCV 102.8 (H) 06/01/2021   PLT 95 (L) 06/01/2021    BMET    Component Value Date/Time   NA 141 12/12/2021 1208   NA 145 (H) 08/11/2019 1415   NA 140 05/27/2014 1055   K 4.5 12/12/2021 1208   K 3.9 05/27/2014 1055   CL 111 12/12/2021 1208   CL 110 05/27/2014 1055   CO2 26 12/12/2021 1208   CO2 25 05/27/2014 1055   GLUCOSE 127 (H) 12/12/2021 1208   GLUCOSE 109 (H) 05/27/2014 1055   BUN 21 12/12/2021 1208   BUN 13 08/11/2019 1415   BUN 16 05/27/2014 1055   CREATININE 1.74 (H) 12/12/2021 1208   CREATININE 1.19 05/27/2014 1055   CALCIUM 9.8 12/12/2021 1208   CALCIUM  8.5 (L) 05/27/2014 1055   GFRNONAA 41 (L) 12/12/2021 1208   GFRNONAA >60 05/27/2014 1055   GFRAA 54 (L) 08/19/2019 1252   GFRAA >60 05/27/2014 1055   CrCl cannot be calculated (Patient's most recent lab result is older than the maximum 21 days allowed.).  COAG Lab Results  Component Value Date   INR 3.0 03/07/2022   INR 2.0 01/24/2022   INR 1.8 (A) 01/03/2022    Radiology No results found.   Assessment/Plan 1. Atherosclerosis of native artery of both lower extremities with intermittent claudication (HCC)  Recommend:  The patient has evidence of atherosclerosis of the lower extremities with claudication.  The patient does not voice lifestyle limiting changes at this point in time.  Noninvasive studies do not suggest clinically significant change.  No invasive studies, angiography or surgery at this time The patient should continue walking and begin a more formal exercise program.  The patient should continue antiplatelet therapy and aggressive treatment of the lipid abnormalities  No changes in the patient's medications at this time  Continued surveillance is indicated as atherosclerosis is likely to progress with time.    The patient will continue follow up with noninvasive studies as ordered.     - VAS [place] ABI WITH/WO TBI; Future  2. Coronary artery disease involving native coronary artery of native heart without angina pectoris Continue cardiac and antihypertensive medications as already ordered and reviewed, no changes at this time.  Continue statin as ordered and reviewed, no changes at this time  Nitrates PRN for chest pain  3. Primary hypertension Continue antihypertensive medications as already ordered, these medications have been reviewed and there are no changes at this time.  4. Persistent atrial fibrillation (HCC) Continue antiarrhythmia medications as already ordered, these medications have been reviewed and there are no changes at this time.  Continue  anticoagulation as ordered by Cardiology Service  5. Type 2 diabetes mellitus with other circulatory complication, without long-term current use of insulin (HCC) Continue hypoglycemic medications as already ordered, these medications have been reviewed and there are no changes at this time.  Hgb A1C to be monitored as already arranged by primary service  6. PAD (peripheral artery disease) ([place]) See #1 - VAS [place] ABI WITH/WO TBI    Belenda Cruise  Meeah Totino, MD  03/21/2022 3:35 PM

## 2022-03-22 ENCOUNTER — Ambulatory Visit (INDEPENDENT_AMBULATORY_CARE_PROVIDER_SITE_OTHER): Payer: PPO

## 2022-03-22 ENCOUNTER — Encounter (INDEPENDENT_AMBULATORY_CARE_PROVIDER_SITE_OTHER): Payer: Self-pay | Admitting: Vascular Surgery

## 2022-03-22 ENCOUNTER — Ambulatory Visit (INDEPENDENT_AMBULATORY_CARE_PROVIDER_SITE_OTHER): Payer: PPO | Admitting: Vascular Surgery

## 2022-03-22 VITALS — BP 113/55 | HR 50 | Resp 18 | Ht 69.0 in | Wt 162.2 lb

## 2022-03-22 DIAGNOSIS — I2510 Atherosclerotic heart disease of native coronary artery without angina pectoris: Secondary | ICD-10-CM

## 2022-03-22 DIAGNOSIS — E1159 Type 2 diabetes mellitus with other circulatory complications: Secondary | ICD-10-CM | POA: Diagnosis not present

## 2022-03-22 DIAGNOSIS — I70213 Atherosclerosis of native arteries of extremities with intermittent claudication, bilateral legs: Secondary | ICD-10-CM

## 2022-03-22 DIAGNOSIS — I4819 Other persistent atrial fibrillation: Secondary | ICD-10-CM | POA: Diagnosis not present

## 2022-03-22 DIAGNOSIS — I10 Essential (primary) hypertension: Secondary | ICD-10-CM

## 2022-03-22 DIAGNOSIS — I739 Peripheral vascular disease, unspecified: Secondary | ICD-10-CM | POA: Diagnosis not present

## 2022-03-27 ENCOUNTER — Encounter: Payer: Self-pay | Admitting: Podiatry

## 2022-03-27 ENCOUNTER — Ambulatory Visit: Payer: PPO | Admitting: Podiatry

## 2022-03-27 VITALS — BP 106/49 | HR 50

## 2022-03-27 DIAGNOSIS — E119 Type 2 diabetes mellitus without complications: Secondary | ICD-10-CM | POA: Diagnosis not present

## 2022-03-27 NOTE — Progress Notes (Signed)
Chief Complaint  Patient presents with   Nail Problem    "My toenails and my feet stay numb all the time from the chemo." N - toenails L - 1-5 b/l D - years O - gradually C - thick, hard to cut A - none T - my wife tries to cut them the best she can    HPI: 77 y.o. male presenting today as a new patient referral from his PCP for diabetic foot evaluation.  Patient has no complaints or pain to his feet or ankles.  He does have a history of peripheral polyneuropathy secondary to chemotherapy.  Patient experiences occasional numbness and tingling.  He is also closely monitored and followed by vascular, Dr. Delana Meyer, [facility].  Presenting for diabetic foot checkup  Past Medical History:  Diagnosis Date   Adenocarcinoma of colon ([place]) 06/19/2013   a. moderately differentiated stage IIIc (T4bN2a M0) adenocarcinoma of colon  s/p colectomy followed by chemoRx with oxaliplatin & fluorouacil.   Anemia    CAD (coronary artery disease)    a. 02/2015 Cath: LM nl, LAD 50/40 mid/distal, LCX 80p, RCA 40p, 30d, RPL1 40, CO 3.27, CI 1.65; b. 06/09/15 PCI: LM minor irregs, m-dLAD 50%, dLAD 40%, pLCx 80% (s/p PCI/DES 0%), pRCA 40%, dRCA 30%, 1st RPLB 40%   Chronic kidney disease, stage 3a (HCC)    Chronic systolic CHF (congestive heart failure) ([place])    a. 02/2015 Echo: EF 20-25%, diff HK, mod MR, mildy dil LA, mildly dil RV w/ mod RV syst dysfxn, mildly dil RA, mod TR, PASP 48mHg; b. 09/2017 Echo: EF 40-45%, diff HK. Gr1 DD. Mild to mod MR. Mildly to mod dil LA; c. 09/2019 Echo: EF 25-30%, glob HK, mod reduced RV fxn, sev elev PASP. Mod dil LA. Mild-mod MR; d. 05/2021 Echo: EF 20-25%, glob HK, sev red RV fxn, sev BAE, sev MR/TR/TS, triv AI.   Diabetes mellitus without complication ([place]    Essential hypertension    Hypertensive heart disease    Left inguinal hernia 05/2020   Mixed Ischemic and Non-ischemic Cardiomyopathy    a. 02/2015 EF 20-25%, diff HK; b. 09/2016 EF 40-45%; c. 09/2019 EF  25-30%; d. 05/2021 Echo: EF 20-25%, glob HK.   Persistent atrial fibrillation ([place]    a. Dx 05/2021-->CHA2DS2VASc = 5-->eliquis.   Pulmonary hypertension (HCC)    Severe mitral regurgitation    a. 09/2017 Echo: Mild to mod MR; b. 09/2019 Echo: mild-mod MR; c. 05/2021 Echo: Sev MR/TR/TS.   Tubular adenoma of colon     Past Surgical History:  Procedure Laterality Date   APPENDECTOMY N/A 06/19/2017   [facility] [facility] [place]geon: WConsuela Mimes MD   CARDIAC CATHETERIZATION Bilateral 02/24/2015   Procedure: Right/Left Heart Cath and Coronary Angiography;  Surgeon: MWellington Hampshire MD;  [facility];  Service: Cardiovascular;  Laterality: Bilateral;   CARDIAC CATHETERIZATION N/A 06/09/2015   Procedure: Coronary Stent Intervention (3.0 x 12 mm Xience Alpine DES to LCx);  Surgeon: MWellington Hampshire MD;  [facility];  Service: Cardiovascular;  Laterality: N/A   COLECTOMY  06/19/2017   Descending and proximal sigmoid colectomy, LEFT ureterolysis, partial cecectomy, appendectomy; [place] [facility] Surgeon: WConsuela Mimes MD   COLONOSCOPY     COLONOSCOPY WITH PROPOFOL N/A 12/11/2016   Procedure: COLONOSCOPY WITH PROPOFOL;  Surgeon: SLollie Sails MD;  [facility];  Service: Endoscopy;  Laterality: N/A;   ESOPHAGOGASTRODUODENOSCOPY     PORT-A-CATH REMOVAL N/A  07/12/2014   Procedure: REMOVAL PORT-A-CATH;  Surgeon: Molly Maduro, MD;  Location: [facility];  Service: General;  Laterality: N/A;   PORTACATH PLACEMENT Left 2015    Allergies  Allergen Reactions   Shellfish Allergy Other (See Comments) and Hives    Pt. instructed by MD to avoid seafood     Physical Exam: General: The patient is alert and oriented x3 in no acute distress.  Dermatology: Skin is cool, dry and supple bilateral lower extremities. No open wounds  Vascular: Known history of peripheral vascular disease.  Closely monitored by [facility]. VAS [place] ABI  W/WO TBI was performed on 03/22/2022 by Dr. Delana Meyer  Neurological: Managed via light touch  Musculoskeletal Exam: Mild hallux valgus and hammertoe deformities noted bilateral   Assessment: 1.  Encounter for diabetic foot exam 2.  Known peripheral vascular disease bilateral lower extremities -Patient evaluated.  Comprehensive diabetic foot exam performed today -Clinically there is no concern for open wounds or complications from the foot.  Continue management with [facility] -Return to clinic annually.      Edrick Kins, DPM [facility]  Dr. Edrick Kins, DPM    [address], [place] [address]                Office [phone]  Fax [phone]

## 2022-03-28 LAB — VAS US ABI WITH/WO TBI
Left ABI: 0.99
Right ABI: 0.65

## 2022-03-31 ENCOUNTER — Encounter (INDEPENDENT_AMBULATORY_CARE_PROVIDER_SITE_OTHER): Payer: Self-pay | Admitting: Vascular Surgery

## 2022-04-04 ENCOUNTER — Encounter: Payer: Self-pay | Admitting: Cardiology

## 2022-04-04 ENCOUNTER — Other Ambulatory Visit
Admission: RE | Admit: 2022-04-04 | Discharge: 2022-04-04 | Disposition: A | Discharge status: Home / Self Care | Payer: PPO | Source: Ambulatory Visit | Attending: Cardiology | Admitting: Cardiology

## 2022-04-04 ENCOUNTER — Other Ambulatory Visit (HOSPITAL_COMMUNITY): Payer: Self-pay

## 2022-04-04 ENCOUNTER — Ambulatory Visit (HOSPITAL_BASED_OUTPATIENT_CLINIC_OR_DEPARTMENT_OTHER): Payer: PPO | Admitting: Cardiology

## 2022-04-04 VITALS — BP 128/56 | Resp 18 | Ht 69.0 in | Wt 160.0 lb

## 2022-04-04 DIAGNOSIS — I110 Hypertensive heart disease with heart failure: Secondary | ICD-10-CM | POA: Insufficient documentation

## 2022-04-04 DIAGNOSIS — I4819 Other persistent atrial fibrillation: Secondary | ICD-10-CM | POA: Insufficient documentation

## 2022-04-04 DIAGNOSIS — I5022 Chronic systolic (congestive) heart failure: Secondary | ICD-10-CM | POA: Insufficient documentation

## 2022-04-04 DIAGNOSIS — N183 Chronic kidney disease, stage 3 unspecified: Secondary | ICD-10-CM

## 2022-04-04 LAB — CBC
HCT: 31.6 % — ABNORMAL LOW (ref 39.0–52.0)
Hemoglobin: 10 g/dL — ABNORMAL LOW (ref 13.0–17.0)
MCH: 32.5 pg (ref 26.0–34.0)
MCHC: 31.6 g/dL (ref 30.0–36.0)
MCV: 102.6 fL — ABNORMAL HIGH (ref 80.0–100.0)
Platelets: 129 10*3/uL — ABNORMAL LOW (ref 150–400)
RBC: 3.08 MIL/uL — ABNORMAL LOW (ref 4.22–5.81)
RDW: 16.5 % — ABNORMAL HIGH (ref 11.5–15.5)
WBC: 3.1 10*3/uL — ABNORMAL LOW (ref 4.0–10.5)
nRBC: 0 % (ref 0.0–0.2)

## 2022-04-04 LAB — BRAIN NATRIURETIC PEPTIDE: B Natriuretic Peptide: 1391.7 pg/mL — ABNORMAL HIGH (ref 0.0–100.0)

## 2022-04-04 LAB — BASIC METABOLIC PANEL
Anion gap: 6 (ref 5–15)
BUN: 21 mg/dL (ref 8–23)
CO2: 24 mmol/L (ref 22–32)
Calcium: 8.8 mg/dL — ABNORMAL LOW (ref 8.9–10.3)
Chloride: 111 mmol/L (ref 98–111)
Creatinine, Ser: 1.42 mg/dL — ABNORMAL HIGH (ref 0.61–1.24)
GFR, Estimated: 52 mL/min — ABNORMAL LOW (ref >60)
Glucose, Bld: 119 mg/dL — ABNORMAL HIGH (ref 70–99)
Potassium: 4.6 mmol/L (ref 3.5–5.1)
Sodium: 141 mmol/L (ref 135–145)

## 2022-04-04 MED ORDER — DAPAGLIFLOZIN PROPANEDIOL 10 MG PO TABS: 10.0000 mg | ORAL_TABLET | Freq: Every day | ORAL | 6 refills | Status: DC

## 2022-04-04 MED ORDER — LOSARTAN POTASSIUM 50 MG PO TABS: 50.0000 mg | ORAL_TABLET | Freq: Every day | ORAL | 3 refills | Status: DC

## 2022-04-04 NOTE — Progress Notes (Signed)
ADVANCED HF CLINIC CONSULT NOTE  Referring Physician: Ignacia Bayley, NP Primary Care: Leone Haven, MD Primary Cardiologist: Kathlyn Sacramento, MD   HPI:  [name] is a 77 year old male with a history of CAD status post circumflex stenting, chronic HFrEF, mixed ischemic and nonischemic cardiomyopathy, hypertension, hyperlipidemia, stage III chronic kidney disease, and colon cancer, who presents for heart failure follow-up.   Found to have colon CA in 2015. Underwent colectomy by chemotherapy with oxaliplatin and fluorouracil.   In January 2017, he was diagnosed with systolic heart failure. Echo EF 20-25% with moderate MR/TR, and moderate to severe pulmonary hypertension.  Right and left heart cardiac catheterization showed moderate to severe pulmonary hypertension with severely elevated filling pressures and severely reduced cardiac output.  Coronary angiography showed severe one-vessel CAD with an 80 to 90% stenosis in the proximal left circumflex, and mild diffuse disease involving the LAD and RCA.  He underwent PCI of the left circumflex in May 2017.  Follow-up echo in November 2020, showed an EF of 30 to 35%.  In the setting of PVCs, outpatient monitoring was performed and showed a 6% PVC burden, which improved with resumption of carvedilol.    RHC 1/17  RA = 9  PA = 70/24 (40) PCWP = 30  (v = 35) Fick CO/CI = 3.3/1.3 PVR = 3.0 WU  Echo in April 2021 showed persistent LV dysfunction with an EF of 25-30%, severe PAH, and mild to moderate MR.  He was offered EP referral for ICD therapy however, he declined. In April 2023, he required admission secondary to massive volume overload and weight gain despite titration of outpatient diuretics.  Echo again showed an EF of 20-25% with severely dilated left ventricle, severely reduced RV function, severe biatrial lodgment, severe MR/TR, circumferential pericardial effusion, and trivial AI.  After copious diuresis, he was discharged home at 151  pounds.    [name] was last seen in cardiology clinic in September 2023, at which time his weight was stable and he was euvolemic.  He again declined EP referral and also declined initiation of SGLT2 inhibitor secondary to cost.  He was unable to afford Entresto but was willing to start losartan  Interval hx: Since his last appointment, reports that he has had no dyspnea, lightheadedness, lower extremity edema or orthopnea. He does not feel functionally limited and can walk >100 yard without difficulty. He is still not interested in ICD.   FHx: Mother died when was 68 months old Unsure how his Dad passed 2 half brothers - he doesn't know them Doesn't know much about his family 4 kids - 69 grandkids   Review of Systems: [y] = yes, '[ ]'$  = no   General: Weight gain '[ ]'$ ; Weight loss '[ ]'$ ; Anorexia '[ ]'$ ; Fatigue '[ ]'$ ; Fever '[ ]'$ ; Chills '[ ]'$ ; Weakness '[ ]'$   Cardiac: Chest pain/pressure '[ ]'$ ; Resting SOB '[ ]'$ ; Exertional SOB '[ ]'$ ; Orthopnea '[ ]'$ ; Pedal Edema [ y]; Palpitations '[ ]'$ ; Syncope '[ ]'$ ; Presyncope '[ ]'$ ; Paroxysmal nocturnal dyspnea'[ ]'$   Pulmonary: Cough '[ ]'$ ; Wheezing'[ ]'$ ; Hemoptysis'[ ]'$ ; Sputum '[ ]'$ ; Snoring '[ ]'$   GI: Vomiting'[ ]'$ ; Dysphagia'[ ]'$ ; Melena'[ ]'$ ; Hematochezia '[ ]'$ ; Heartburn'[ ]'$ ; Abdominal pain '[ ]'$ ; Constipation '[ ]'$ ; Diarrhea '[ ]'$ ; BRBPR '[ ]'$   GU: Hematuria'[ ]'$ ; Dysuria '[ ]'$ ; Nocturia'[ ]'$   Vascular: Pain in legs with walking '[ ]'$ ; Pain in feet with lying flat '[ ]'$ ; Non-healing sores '[ ]'$ ; Stroke '[ ]'$ ; TIA '[ ]'$ ;  Slurred speech '[ ]'$ ;  Neuro: Headaches'[ ]'$ ; Vertigo'[ ]'$ ; Seizures'[ ]'$ ; Paresthesias'[ ]'$ ;Blurred vision '[ ]'$ ; Diplopia '[ ]'$ ; Vision changes '[ ]'$   Ortho/Skin: Arthritis '[ ]'$ ; Joint pain '[ ]'$ ; Muscle pain '[ ]'$ ; Joint swelling '[ ]'$ ; Back Pain '[ ]'$ ; Rash '[ ]'$   Psych: Depression'[ ]'$ ; Anxiety'[ ]'$   Heme: Bleeding problems '[ ]'$ ; Clotting disorders '[ ]'$ ; Anemia '[ ]'$   Endocrine: Diabetes '[ ]'$ ; Thyroid dysfunction'[ ]'$    Past Medical History:  Diagnosis Date   Adenocarcinoma of colon ([place]) 06/19/2013   a. moderately  differentiated stage IIIc (T4bN2a M0) adenocarcinoma of colon  s/p colectomy followed by chemoRx with oxaliplatin & fluorouacil.   Anemia    CAD (coronary artery disease)    a. 02/2015 Cath: LM nl, LAD 50/40 mid/distal, LCX 80p, RCA 40p, 30d, RPL1 40, CO 3.27, CI 1.65; b. 06/09/15 PCI: LM minor irregs, m-dLAD 50%, dLAD 40%, pLCx 80% (s/p PCI/DES 0%), pRCA 40%, dRCA 30%, 1st RPLB 40%   Chronic kidney disease, stage 3a (HCC)    Chronic systolic CHF (congestive heart failure) ([place])    a. 02/2015 Echo: EF 20-25%, diff HK, mod MR, mildy dil LA, mildly dil RV w/ mod RV syst dysfxn, mildly dil RA, mod TR, PASP 22mHg; b. 09/2017 Echo: EF 40-45%, diff HK. Gr1 DD. Mild to mod MR. Mildly to mod dil LA; c. 09/2019 Echo: EF 25-30%, glob HK, mod reduced RV fxn, sev elev PASP. Mod dil LA. Mild-mod MR; d. 05/2021 Echo: EF 20-25%, glob HK, sev red RV fxn, sev BAE, sev MR/TR/TS, triv AI.   Diabetes mellitus without complication ([place]    Essential hypertension    Hypertensive heart disease    Left inguinal hernia 05/2020   Mixed Ischemic and Non-ischemic Cardiomyopathy    a. 02/2015 EF 20-25%, diff HK; b. 09/2016 EF 40-45%; c. 09/2019 EF 25-30%; d. 05/2021 Echo: EF 20-25%, glob HK.   Persistent atrial fibrillation ([place]    a. Dx 05/2021-->CHA2DS2VASc = 5-->eliquis.   Pulmonary hypertension (HCC)    Severe mitral regurgitation    a. 09/2017 Echo: Mild to mod MR; b. 09/2019 Echo: mild-mod MR; c. 05/2021 Echo: Sev MR/TR/TS.   Tubular adenoma of colon     Current Outpatient Medications  Medication Sig Dispense Refill   atorvastatin (LIPITOR) 40 MG tablet Take 1 tablet (40 mg total) by mouth daily. 90 tablet 3   carvedilol (COREG) 3.125 MG tablet Take 1 tablet (3.125 mg total) by mouth 2 (two) times daily with a meal. 180 tablet 3   cholecalciferol (VITAMIN D3) 25 MCG (1000 UNIT) tablet Take 1,000 Units by mouth daily.     ELIQUIS 5 MG TABS tablet Take 5 mg by mouth 2 (two) times daily.     ferrous sulfate 325 (65 FE) MG  tablet Take 325 mg by mouth daily with breakfast.     gabapentin (NEURONTIN) 100 MG capsule Take 100 mg by mouth 3 (three) times daily. Taking 2 -100 mg pills in am and 1 -100 mg at night     loratadine (CLARITIN) 10 MG tablet Take 1 tablet (10 mg total) by mouth daily. 30 tablet 11   losartan (COZAAR) 25 MG tablet Take 1 tablet (25 mg total) by mouth daily. 30 tablet 6   Magnesium Oxide 400 MG CAPS Take 1 capsule (400 mg total) by mouth daily. 90 capsule 3   metFORMIN (GLUCOPHAGE) 500 MG tablet Take 500 mg by mouth 2 (two) times daily with a meal.     potassium  chloride SA (KLOR-CON M20) 20 MEQ tablet Take 2 tablets (40 mEq total) by mouth daily. 60 tablet 6   spironolactone (ALDACTONE) 25 MG tablet TAKE 1 TABLET(25 MG) BY MOUTH DAILY 30 tablet 5   torsemide (DEMADEX) 20 MG tablet Take 20 mg by mouth daily.     warfarin (COUMADIN) 5 MG tablet Take 1 tablet (5 mg) by mouth once daily as directed by the coumadin clinic 50 tablet 1   No current facility-administered medications for this visit.    Allergies  Allergen Reactions   Shellfish Allergy Other (See Comments) and Hives    Pt. instructed by MD to avoid seafood      Social History   Socioeconomic History   Marital status: Married    Spouse name: Romie Minus   Number of children: 4   Years of education: Not on file   Highest education level: Not on file  Occupational History   Occupation: retired  Tobacco Use   Smoking status: Never   Smokeless tobacco: Never  Vaping Use   Vaping Use: Never used  Substance and Sexual Activity   Alcohol use: Not Currently    Comment: none in many years   Drug use: No   Sexual activity: Yes  Other Topics Concern   Not on file  Social History Narrative   Not on file   Social Determinants of Health   Financial Resource Strain: Low Risk  (10/02/2021)   Overall Financial Resource Strain (CARDIA)    Difficulty of Paying Living Expenses: Not hard at all  Food Insecurity: No Food Insecurity  (10/02/2021)   Hunger Vital Sign    Worried About Running Out of Food in the Last Year: Never true    [place] in the Last Year: Never true  Transportation Needs: No Transportation Needs (10/02/2021)   PRAPARE - Hydrologist (Medical): No    Lack of Transportation (Non-Medical): No  Physical Activity: Not on file  Stress: No Stress Concern Present (10/02/2021)   [place]    Feeling of Stress : Not at all  Social Connections: Unknown (10/02/2021)   Social Connection and Isolation Panel [NHANES]    Frequency of Communication with Friends and Family: Not on file    Frequency of Social Gatherings with Friends and Family: Not on file    Attends Religious Services: Not on file    Active Member of Clubs or Organizations: Not on file    Attends Archivist Meetings: Not on file    Marital Status: Married  Intimate Partner Violence: Not At Risk (10/02/2021)   Humiliation, Afraid, Rape, and Kick questionnaire    Fear of Current or Ex-Partner: No    Emotionally Abused: No    Physically Abused: No    Sexually Abused: No      Family History  Problem Relation Age of Onset   Cancer Mother     Vitals:   04/04/22 1336  Weight: 160 lb (72.6 kg)  Height: '5\' 9"'$  (1.753 m)    PHYSICAL EXAM: General:  thin AAM in NAD.  HEENT: normal poor dentition Neck: supple. no JVD. Carotids 2+ bilat; no bruits. No lymphadenopathy or thryomegaly appreciated. Cor: PMI nondisplaced. Regular brady No rubs, gallops or murmurs. Lungs: CTA B/L; no crackles.  Abdomen: soft, nontender, nondistended. No hepatosplenomegaly. No bruits or masses. Good bowel sounds. Extremities: no cyanosis, clubbing, rash, edema Neuro: alert & oriented x 3, cranial nerves  grossly intact. moves all 4 extremities w/o difficulty. Affect pleasant.  ECG: Sinus brady 53 1AVB (240m) IVC (1022m diffuse repol abnormalities Personally  reviewed   ASSESSMENT & PLAN:   1.  Chronic systolic HF due to mixed NICM/ICM - primarily NICM - Onset 1/17. Echo EF 20-25% with moderate MR/TR - Cath 1/17: severe 1v CAD with 80-90% stenosis in the proximal left circumflex -> PCI, and mild diffuse disease inLAD and RCA.  - Echo 11/20 EF 30-35%.   - Monitor with 6% PVC burden,  - Echo 4/23 EF 20-25% with severely dilated left ventricle, severely reduced RV function, severe biatrial lodgment, severe MR/TR, circumferential pericardial effusion, and trivial AI.   - NYHA II - Volume status ok on torsemide 20 daily - Refuses ICD - Hypertensive today, increase losartan to '50mg'$  daily. Repeat labs today.  - Continue carvedilol 3.125 bid - unable to titrate due to bradycardia - Continue spiro 25 daily - We had a lengthy discussion today regarding his medication regimen. He is now open to trying FaIranWill attempt to obtain patient assistance.  - CMR on 04/11/22 to evaluate for LVNC.   2.  Persistent atrial fibrillation:  - Maintaining sinus rhythm today. - On warfarin because Eliquis was cost prohibitive.     3.  Coronary artery disease:  - Status post circumflex stenting in 2017. -  No aspirin in the setting of warfarin.   4.  Essential hypertension:  - Blood pressure well controlled. Continue current regimen.   5.  Stage IIIB chronic kidney disease:  - Repeat labs today. Starting SGLT2I.   Dae Antonucci, DO  1:50 PM

## 2022-04-04 NOTE — Patient Instructions (Signed)
INCREASE Losartan to '50mg'$  daily  START Farxiga '10mg'$  daily  Routine lab work today. Will notify you of abnormal results  Follow up in 4 months with Dr.Bensimhon  Do the following things EVERYDAY: Weigh yourself in the morning before breakfast. Write it down and keep it in a log. Take your medicines as prescribed Eat low salt foods--Limit salt (sodium) to 2000 mg per day.  Stay as active as you can everyday Limit all fluids for the day to less than 2 liters

## 2022-04-09 ENCOUNTER — Telehealth (HOSPITAL_COMMUNITY): Payer: Self-pay | Admitting: *Deleted

## 2022-04-09 NOTE — Telephone Encounter (Signed)
Attempted to call patient regarding upcoming cardiac MRI appointment. Left message on voicemail with name and callback number  Tashan Kreitzer RN Navigator Cardiac Imaging [facility] [phone] Office [phone] Cell  

## 2022-04-11 ENCOUNTER — Other Ambulatory Visit: Payer: Self-pay | Admitting: Internal Medicine

## 2022-04-11 ENCOUNTER — Ambulatory Visit
Admission: RE | Admit: 2022-04-11 | Discharge: 2022-04-11 | Disposition: A | Discharge status: Home / Self Care | Payer: PPO | Source: Ambulatory Visit | Attending: Internal Medicine | Admitting: Internal Medicine

## 2022-04-11 DIAGNOSIS — I429 Cardiomyopathy, unspecified: Secondary | ICD-10-CM

## 2022-04-11 MED ORDER — GADOBUTROL 1 MMOL/ML IV SOLN
10.0000 mL | Freq: Once | INTRAVENOUS | Status: AC | PRN
  Administered 2022-04-11: 10 mL via INTRAVENOUS

## 2022-04-17 ENCOUNTER — Inpatient Hospital Stay: Payer: PPO | Attending: Oncology

## 2022-04-17 DIAGNOSIS — Z85038 Personal history of other malignant neoplasm of large intestine: Secondary | ICD-10-CM | POA: Diagnosis present

## 2022-04-17 DIAGNOSIS — E538 Deficiency of other specified B group vitamins: Secondary | ICD-10-CM | POA: Insufficient documentation

## 2022-04-17 MED ORDER — CYANOCOBALAMIN 1000 MCG/ML IJ SOLN
1000.0000 ug | INTRAMUSCULAR | Status: DC
  Administered 2022-04-17: 1000 ug via INTRAMUSCULAR
  Filled 2022-04-17: qty 1

## 2022-04-18 ENCOUNTER — Other Ambulatory Visit: Payer: Self-pay | Admitting: Cardiovascular Disease

## 2022-04-18 ENCOUNTER — Ambulatory Visit: Payer: PPO | Attending: Cardiovascular Disease

## 2022-04-18 DIAGNOSIS — Z7901 Long term (current) use of anticoagulants: Secondary | ICD-10-CM | POA: Diagnosis not present

## 2022-04-18 DIAGNOSIS — I4819 Other persistent atrial fibrillation: Secondary | ICD-10-CM

## 2022-04-18 LAB — POCT INR: INR: 3 (ref 2.0–3.0)

## 2022-04-18 NOTE — Telephone Encounter (Signed)
Refill request for warfarin:  Last INR was 3.0 on 04/18/22 Next INR due 05/30/22 LOV was 04/04/22  Refill approved.

## 2022-04-18 NOTE — Telephone Encounter (Signed)
Please review

## 2022-04-18 NOTE — Patient Instructions (Signed)
Description   Continue on same dosage of Warfarin 0.5 TABLET DAILY, EXCEPT 1 TABLET ON MONDAYS AND FRIDAYS. Recheck INR in 6 weeks.   [phone]

## 2022-04-23 ENCOUNTER — Other Ambulatory Visit: Payer: Self-pay | Admitting: Nurse Practitioner

## 2022-05-16 ENCOUNTER — Inpatient Hospital Stay: Payer: PPO | Attending: Oncology

## 2022-05-16 DIAGNOSIS — N1831 Chronic kidney disease, stage 3a: Secondary | ICD-10-CM | POA: Diagnosis not present

## 2022-05-16 DIAGNOSIS — I5022 Chronic systolic (congestive) heart failure: Secondary | ICD-10-CM | POA: Diagnosis not present

## 2022-05-16 DIAGNOSIS — Z7901 Long term (current) use of anticoagulants: Secondary | ICD-10-CM | POA: Diagnosis not present

## 2022-05-16 DIAGNOSIS — E1122 Type 2 diabetes mellitus with diabetic chronic kidney disease: Secondary | ICD-10-CM | POA: Insufficient documentation

## 2022-05-16 DIAGNOSIS — Z79899 Other long term (current) drug therapy: Secondary | ICD-10-CM | POA: Insufficient documentation

## 2022-05-16 DIAGNOSIS — D631 Anemia in chronic kidney disease: Secondary | ICD-10-CM | POA: Insufficient documentation

## 2022-05-16 DIAGNOSIS — E538 Deficiency of other specified B group vitamins: Secondary | ICD-10-CM | POA: Insufficient documentation

## 2022-05-16 DIAGNOSIS — Z85038 Personal history of other malignant neoplasm of large intestine: Secondary | ICD-10-CM | POA: Diagnosis present

## 2022-05-16 DIAGNOSIS — D696 Thrombocytopenia, unspecified: Secondary | ICD-10-CM | POA: Insufficient documentation

## 2022-05-16 DIAGNOSIS — I130 Hypertensive heart and chronic kidney disease with heart failure and stage 1 through stage 4 chronic kidney disease, or unspecified chronic kidney disease: Secondary | ICD-10-CM | POA: Diagnosis not present

## 2022-05-16 DIAGNOSIS — Z7984 Long term (current) use of oral hypoglycemic drugs: Secondary | ICD-10-CM | POA: Diagnosis not present

## 2022-05-16 LAB — COMPREHENSIVE METABOLIC PANEL
ALT: 17 U/L (ref 0–44)
AST: 21 U/L (ref 15–41)
Albumin: 4.1 g/dL (ref 3.5–5.0)
Alkaline Phosphatase: 85 U/L (ref 38–126)
Anion gap: 5 (ref 5–15)
BUN: 21 mg/dL (ref 8–23)
CO2: 23 mmol/L (ref 22–32)
Calcium: 9.4 mg/dL (ref 8.9–10.3)
Chloride: 112 mmol/L — ABNORMAL HIGH (ref 98–111)
Creatinine, Ser: 1.69 mg/dL — ABNORMAL HIGH (ref 0.61–1.24)
GFR, Estimated: 42 mL/min — ABNORMAL LOW (ref >60)
Glucose, Bld: 98 mg/dL (ref 70–99)
Potassium: 5 mmol/L (ref 3.5–5.1)
Sodium: 140 mmol/L (ref 135–145)
Total Bilirubin: 1 mg/dL (ref 0.3–1.2)
Total Protein: 7.7 g/dL (ref 6.5–8.1)

## 2022-05-16 LAB — CBC WITH DIFFERENTIAL/PLATELET
Abs Immature Granulocytes: 0.01 10*3/uL (ref 0.00–0.07)
Basophils Absolute: 0 10*3/uL (ref 0.0–0.1)
Basophils Relative: 1 %
Eosinophils Absolute: 0.1 10*3/uL (ref 0.0–0.5)
Eosinophils Relative: 4 %
HCT: 38 % — ABNORMAL LOW (ref 39.0–52.0)
Hemoglobin: 12.2 g/dL — ABNORMAL LOW (ref 13.0–17.0)
Immature Granulocytes: 0 %
Lymphocytes Relative: 27 %
Lymphs Abs: 0.7 10*3/uL (ref 0.7–4.0)
MCH: 32.8 pg (ref 26.0–34.0)
MCHC: 32.1 g/dL (ref 30.0–36.0)
MCV: 102.2 fL — ABNORMAL HIGH (ref 80.0–100.0)
Monocytes Absolute: 0.3 10*3/uL (ref 0.1–1.0)
Monocytes Relative: 13 %
Neutro Abs: 1.5 10*3/uL — ABNORMAL LOW (ref 1.7–7.7)
Neutrophils Relative %: 55 %
Platelets: 116 10*3/uL — ABNORMAL LOW (ref 150–400)
RBC: 3.72 MIL/uL — ABNORMAL LOW (ref 4.22–5.81)
RDW: 13.7 % (ref 11.5–15.5)
WBC: 2.6 10*3/uL — ABNORMAL LOW (ref 4.0–10.5)
nRBC: 0 % (ref 0.0–0.2)

## 2022-05-16 LAB — IRON AND TIBC
Iron: 66 ug/dL (ref 45–182)
Saturation Ratios: 22 % (ref 17.9–39.5)
TIBC: 304 ug/dL (ref 250–450)
UIBC: 238 ug/dL

## 2022-05-16 LAB — VITAMIN B12: Vitamin B-12: 430 pg/mL (ref 180–914)

## 2022-05-16 LAB — FERRITIN: Ferritin: 108 ng/mL (ref 24–336)

## 2022-05-17 LAB — CEA: CEA: 1.7 ng/mL (ref 0.0–4.7)

## 2022-05-18 ENCOUNTER — Inpatient Hospital Stay: Payer: PPO

## 2022-05-18 ENCOUNTER — Inpatient Hospital Stay: Payer: PPO | Admitting: Oncology

## 2022-05-18 ENCOUNTER — Telehealth: Payer: Self-pay

## 2022-05-18 NOTE — Assessment & Plan Note (Deleted)
#   Stage IIIC colon cancer:  Labs reviewed and discussed with patient 05/11/2021, CT abdomen pelvis without contrast showed new anasarca.  No other acute findings identified.  No evidence of recurrence or metastatic carcinoma.  Mild decrease in the size of 5.2 cm fluid collection in the left inguinal canal.  Given that patient is 7+ years after initial cancer diagnosis and treatments, I will hold off additional surveillance CT scan unless clinically indicated. CEA is pending.

## 2022-05-18 NOTE — Telephone Encounter (Signed)
Called pt to schedule an appt from recalls to schedule his 4 month follow up appointment.  Pt did not answer and left vm to call our AHF clinic.

## 2022-05-23 ENCOUNTER — Ambulatory Visit: Payer: PPO | Admitting: Family Medicine

## 2022-05-23 ENCOUNTER — Other Ambulatory Visit: Payer: Self-pay | Admitting: Family Medicine

## 2022-05-23 DIAGNOSIS — B029 Zoster without complications: Secondary | ICD-10-CM

## 2022-05-25 ENCOUNTER — Encounter: Payer: Self-pay | Admitting: Family Medicine

## 2022-05-25 ENCOUNTER — Ambulatory Visit (INDEPENDENT_AMBULATORY_CARE_PROVIDER_SITE_OTHER): Payer: PPO | Admitting: Family Medicine

## 2022-05-25 VITALS — BP 122/74 | HR 51 | Temp 98.2°F | Ht 69.0 in | Wt 154.2 lb

## 2022-05-25 DIAGNOSIS — I4819 Other persistent atrial fibrillation: Secondary | ICD-10-CM | POA: Diagnosis not present

## 2022-05-25 DIAGNOSIS — Z85038 Personal history of other malignant neoplasm of large intestine: Secondary | ICD-10-CM

## 2022-05-25 DIAGNOSIS — I5022 Chronic systolic (congestive) heart failure: Secondary | ICD-10-CM

## 2022-05-25 DIAGNOSIS — E1159 Type 2 diabetes mellitus with other circulatory complications: Secondary | ICD-10-CM | POA: Diagnosis not present

## 2022-05-25 DIAGNOSIS — I10 Essential (primary) hypertension: Secondary | ICD-10-CM

## 2022-05-25 DIAGNOSIS — J309 Allergic rhinitis, unspecified: Secondary | ICD-10-CM | POA: Insufficient documentation

## 2022-05-25 LAB — HEMOGLOBIN A1C: Hgb A1c MFr Bld: 6.1 % (ref 4.6–6.5)

## 2022-05-25 LAB — LIPID PANEL
Cholesterol: 122 mg/dL (ref 0–200)
HDL: 61.2 mg/dL (ref >39.00)
LDL Cholesterol: 52 mg/dL (ref 0–99)
NonHDL: 61.27
Total CHOL/HDL Ratio: 2
Triglycerides: 48 mg/dL (ref 0.0–149.0)
VLDL: 9.6 mg/dL (ref 0.0–40.0)

## 2022-05-25 LAB — MICROALBUMIN / CREATININE URINE RATIO
Creatinine,U: 98.3 mg/dL
Microalb Creat Ratio: 13.1 mg/g (ref 0.0–30.0)
Microalb, Ur: 12.9 mg/dL — ABNORMAL HIGH (ref 0.0–1.9)

## 2022-05-25 MED ORDER — AZELASTINE HCL 0.1 % NA SOLN: 2.0000 | Freq: Two times a day (BID) | NASAL | 12 refills | Status: DC

## 2022-05-25 NOTE — Assessment & Plan Note (Signed)
Chronic issue.  Patient is on warfarin.  He will continue management of this through cardiology.

## 2022-05-25 NOTE — Assessment & Plan Note (Signed)
Chronic issue.  Appears to be euvolemic today.  He will continue torsemide 60 mg twice daily, carvedilol 3.125 mg twice daily, spironolactone 25 mg daily, losartan 25 mg daily, and Farxiga 10 mg daily.

## 2022-05-25 NOTE — Assessment & Plan Note (Signed)
Rhinorrhea is likely related to allergies.  We will trial Astelin nasal spray to see if that is beneficial.

## 2022-05-25 NOTE — Patient Instructions (Signed)
Nice to see you. Please try the Astelin nasal spray to see if that helps with your runny nose and cough. We will check lab work today and contact you with results.

## 2022-05-25 NOTE — Assessment & Plan Note (Signed)
Chronic issue.  Check A1c.  Continue metformin 500 mg twice daily.  He is also on Farxiga 10 mg daily.

## 2022-05-25 NOTE — Progress Notes (Signed)
Christopher Alar, MD Phone: [phone]  Christopher Bee Woodson Macha. is a 77 y.o. male who presents today for f/u.  Hypertension/atrial fibrillation/CHF: Patient continues on carvedilol, Farxiga, losartan, and spironolactone as well as torsemide.  He notes no chest pain, shortness of breath, edema, orthopnea, PND, or bleeding issues.  Patient is also on warfarin and has his INR checked every 6 weeks per his report.  Last INR was 3.0 about a month ago.  Diabetes: Patient continues on metformin.  No polyuria or polydipsia.  No hypoglycemia.  Rhinorrhea: Patient notes this has been going on a couple of weeks.  He notes a little bit of cough with this.  No sneezing, sore throat, postnasal drip, or fevers.  He does not take anything for allergies.  Social History   Tobacco Use  Smoking Status Never  Smokeless Tobacco Never    Current Outpatient Medications on File Prior to Visit  Medication Sig Dispense Refill   atorvastatin (LIPITOR) 40 MG tablet Take 1 tablet (40 mg total) by mouth daily. 90 tablet 3   carvedilol (COREG) 3.125 MG tablet Take 1 tablet (3.125 mg total) by mouth 2 (two) times daily with a meal. 180 tablet 3   cholecalciferol (VITAMIN D3) 25 MCG (1000 UNIT) tablet Take 1,000 Units by mouth daily.     dapagliflozin propanediol (FARXIGA) 10 MG TABS tablet Take 1 tablet (10 mg total) by mouth daily before breakfast. 30 tablet 6   ferrous sulfate 325 (65 FE) MG tablet Take 325 mg by mouth daily with breakfast.     loratadine (CLARITIN) 10 MG tablet TAKE 1 TABLET(10 MG) BY MOUTH DAILY 30 tablet 11   losartan (COZAAR) 50 MG tablet Take 1 tablet (50 mg total) by mouth daily. 30 tablet 3   Magnesium Oxide 400 MG CAPS Take 1 capsule (400 mg total) by mouth daily. 90 capsule 3   metFORMIN (GLUCOPHAGE) 500 MG tablet TAKE 1 TABLET(500 MG) BY MOUTH TWICE DAILY WITH A MEAL 180 tablet 0   potassium chloride SA (KLOR-CON M) 20 MEQ tablet TAKE 2 TABLETS(40 MEQ) BY MOUTH DAILY 60 tablet 6    spironolactone (ALDACTONE) 25 MG tablet TAKE 1 TABLET(25 MG) BY MOUTH DAILY 30 tablet 5   torsemide (DEMADEX) 20 MG tablet Take 20 mg by mouth daily.     warfarin (COUMADIN) 5 MG tablet TAKE 1/2(2.5MG ) TO 1 TABLET(5 MG) BY MOUTH EVERY DAY AS DIRECTED BY COUMADIN CLINIC 50 tablet 1   gabapentin (NEURONTIN) 100 MG capsule Take 100 mg by mouth 3 (three) times daily. Taking 2 -100 mg pills in am and 1 -100 mg at night     No current facility-administered medications on file prior to visit.     ROS see history of present illness  Objective  Physical Exam Vitals:   05/25/22 0922  BP: 122/74  Pulse: (!) 51  Temp: 98.2 F (36.8 C)  SpO2: 99%    BP Readings from Last 3 Encounters:  05/25/22 122/74  04/04/22 (!) 128/56  03/27/22 (!) 106/49   Wt Readings from Last 3 Encounters:  05/25/22 154 lb 3.2 oz (69.9 kg)  04/04/22 160 lb (72.6 kg)  03/22/22 162 lb 3.2 oz (73.6 kg)    Physical Exam Constitutional:      General: He is not in acute distress.    Appearance: He is not diaphoretic.  Cardiovascular:     Rate and Rhythm: Regular rhythm. Bradycardia present.     Heart sounds: Normal heart sounds.  Pulmonary:  Effort: Pulmonary effort is normal.     Breath sounds: Normal breath sounds.  Skin:    General: Skin is warm and dry.  Neurological:     Mental Status: He is alert.      Assessment/Plan: Please see individual problem list.  Primary hypertension Assessment & Plan: Chronic issue.  Well-controlled.  He will continue spironolactone 25 mg daily, torsemide 60 mg twice daily, losartan 25 mg daily, and carvedilol 3.125 mg twice daily.   Type 2 diabetes mellitus with other circulatory complication, without long-term current use of insulin Assessment & Plan: Chronic issue.  Check A1c.  Continue metformin 500 mg twice daily.  He is also on Farxiga 10 mg daily.  Orders: -     Hemoglobin A1c -     Microalbumin / creatinine urine ratio -     Lipid panel  Chronic  systolic heart failure Assessment & Plan: Chronic issue.  Appears to be euvolemic today.  He will continue torsemide 60 mg twice daily, carvedilol 3.125 mg twice daily, spironolactone 25 mg daily, losartan 25 mg daily, and Farxiga 10 mg daily.   Persistent atrial fibrillation Assessment & Plan: Chronic issue.  Patient is on warfarin.  He will continue management of this through cardiology.   Allergic rhinitis, unspecified seasonality, unspecified trigger Assessment & Plan: Rhinorrhea is likely related to allergies.  We will trial Astelin nasal spray to see if that is beneficial.  Orders: -     Azelastine HCl; Place 2 sprays into both nostrils 2 (two) times daily. Use in each nostril as directed  Dispense: 30 mL; Refill: 12  History of colon cancer -     Ambulatory referral to Gastroenterology     Health Maintenance: refer for colonoscopy.  Return in about 6 months (around 11/24/2022).   Christopher Alar, MD [facility]

## 2022-05-25 NOTE — Assessment & Plan Note (Signed)
Chronic issue.  Well-controlled.  He will continue spironolactone 25 mg daily, torsemide 60 mg twice daily, losartan 25 mg daily, and carvedilol 3.125 mg twice daily.

## 2022-05-28 ENCOUNTER — Telehealth: Payer: Self-pay

## 2022-05-28 NOTE — Telephone Encounter (Signed)
-----   Message from Glori Luis, MD sent at 05/28/2022 10:54 AM EDT ----- Please let the patient know his A1c is stable and is well-controlled.  His cholesterol is well-controlled.  His urine protein levels are quite a bit better than they were a year ago.

## 2022-05-28 NOTE — Telephone Encounter (Signed)
A lady answered and said the Patient  will call back in 10 minutes regarding lab results

## 2022-05-29 ENCOUNTER — Inpatient Hospital Stay: Payer: PPO

## 2022-05-29 ENCOUNTER — Inpatient Hospital Stay (HOSPITAL_BASED_OUTPATIENT_CLINIC_OR_DEPARTMENT_OTHER): Payer: PPO | Admitting: Oncology

## 2022-05-29 ENCOUNTER — Other Ambulatory Visit: Payer: Self-pay | Admitting: Oncology

## 2022-05-29 ENCOUNTER — Encounter: Payer: Self-pay | Admitting: Oncology

## 2022-05-29 VITALS — BP 138/50 | HR 75 | Temp 95.0°F | Wt 154.4 lb

## 2022-05-29 DIAGNOSIS — C187 Malignant neoplasm of sigmoid colon: Secondary | ICD-10-CM | POA: Diagnosis not present

## 2022-05-29 DIAGNOSIS — D696 Thrombocytopenia, unspecified: Secondary | ICD-10-CM | POA: Diagnosis not present

## 2022-05-29 DIAGNOSIS — D631 Anemia in chronic kidney disease: Secondary | ICD-10-CM

## 2022-05-29 DIAGNOSIS — E538 Deficiency of other specified B group vitamins: Secondary | ICD-10-CM | POA: Diagnosis not present

## 2022-05-29 DIAGNOSIS — N1831 Chronic kidney disease, stage 3a: Secondary | ICD-10-CM

## 2022-05-29 MED ORDER — CYANOCOBALAMIN 1000 MCG/ML IJ SOLN
1000.0000 ug | INTRAMUSCULAR | Status: DC
  Administered 2022-05-29: 1000 ug via INTRAMUSCULAR
  Filled 2022-05-29: qty 1

## 2022-05-29 NOTE — Assessment & Plan Note (Signed)
Anemia due to chronic kidney disease.   Continue ferrous sulfate 325 mg twice daily to main adequate iron store.

## 2022-05-29 NOTE — Assessment & Plan Note (Signed)
Stable count. Continue observation.

## 2022-05-29 NOTE — Progress Notes (Signed)
Hematology/Oncology Progress note [phone] [phone]     Chief Complaint: Christopher Larkey. is a 77 y.o. male with a history of stage IIIC colon cancer, pancytopenia, and B12 deficiency who presented to follow-up   ASSESSMENT & PLAN:   Colon cancer # Stage IIIC colon cancer 05/11/2021, CT abdomen pelvis without contrast showed new anasarca.  No other acute findings identified.  No evidence of recurrence or metastatic carcinoma.  Mild decrease in the size of 5.2 cm fluid collection in the left inguinal canal.  Given that patient is 7+ years after initial cancer diagnosis and treatments, I will hold off additional surveillance CT scan unless clinically indicated. Labs are reviewed and discussed with patient. CEA remains stable.  Continue clinical surveillance.    Thrombocytopenia Stable count. Continue observation.   B12 deficiency continue monthly vitamin B12 injection.  Anemia in chronic kidney disease Anemia due to chronic kidney disease.   Continue ferrous sulfate 325 mg twice daily to main adequate iron store.    Orders Placed This Encounter  Procedures   CBC with Differential (Cancer Center Only)    Standing Status:   Future    Standing Expiration Date:   05/29/2023   CMP (Cancer Center only)    Standing Status:   Future    Standing Expiration Date:   05/29/2023   Vitamin B12    Standing Status:   Future    Standing Expiration Date:   05/29/2023   Folate    Standing Status:   Future    Standing Expiration Date:   05/29/2023   CEA    Standing Status:   Future    Standing Expiration Date:   05/29/2023   Iron and TIBC    Standing Status:   Future    Standing Expiration Date:   05/29/2023   Ferritin    Standing Status:   Future    Standing Expiration Date:   05/29/2023   Follow up in 1 year All questions were answered. The patient knows to call the clinic with any problems, questions or concerns.  Rickard Patience, MD, PhD [facility] Hematology  Oncology 05/29/2022    PERTINENT ONCOLOGY HISTORY Patient previous oncology care was with Dr. Merlene Pulling who has moved practice to [facility] Patient switches care to me on 02/10/2018  # stage IIIC colon cancer.  He presented with symptomatic anemia.  He underwent descending and proximal sigmoid colectomy, left ureteral lysis, and partial cecectomy on 06/23/2013. Six of 22 lymph nodes were positive.  There were tumor deposits (discontinuous extramural extension).  There was lymphovascular and perineural invasion. Pathologic stage was IIIC (T4bN2a M0). He received FOLFOX chemotherapy from 07/22/2013 until 01/06/2014.    Chest, abdomen, and pelvic CT on 01/12/2016 revealed no evidence of recurrent or metastatic carcinoma.  Abdomen and pelvic CT with and without contrast on 09/13/2017 revealed no multiple simple renal cysts.  Liver was unremarkable.  Colonoscopy on 04/12/2014 revealed several polyps.  There was no dysplasia or malignancy.  Colonoscopy on 12/11/2016 revealed 10 polyps (2-8 mm) in the ascending proximal transverse, descending, proximal sigmoid and rectum.  Pathology revealed multiple tubular adenomas without dysplasia or malignancy.  He was diagnosed with pyelonephritis.  He is undergoing evaluation for hematuria.  CT scan was negative.  Cystoscopy on 10/03/2017 revealed prostate enlargement and no evidence of bladder pathology.  # He has a mild pancytopenia.  He received a course of Septra (08/26/2017 - 09/02/2017).  He has a history of a mild normocytic anemia.  Diet  is modest.  He denies herbal products.  He denies any melena or hematochezia.  Work-up on 09/27/2017 revealed a hematocrit 31.1, hemoglobin 10.6, MCV 94, platelets 141,000, WBC 2700 with an ANC of 1600.  Creatinine was 1.32.  He has B12 deficiency.  B12 was low (149).  Anti-parietal cell antibody and intrinsic factor antibody were negative.   Negative studies included:  folate (12.3), TSH, hepatitis B core antibody total, and  hepatitis C antibody. Retic was 1.8%.  Ferritin was 103 with a iron saturation of 25% and a TIBC of 201.   Creatinine was 1.32.  He has B12 deficiency.  B12 was 149 on 09/27/2017 then 322 on 11/04/2017 and 227 on 12/05/2017 on oral B12.  Echocardiogram revealed an EF was 20-25% on 02/18/2015.  Cardiac catheterization on 02/24/2015 revealed 80% occlusion of the proximal left circumflex.  He managed medically.  09/07/2020 patient had bilateral inguinal hernia repair with mesh.  By Dr. Claudine Mouton 09/20/2020, CT chest abdomen pelvis for colon cancer follow-up showed no definitive signs of new/progressive findings Large collection containing gas on the left inguinal canal.  Scattered fluid and gas suspected hematoma in the pelvis and right and left iliac fossa.  Extraperitoneal thickening along the lower left quadrant adjacent to small bowel loops suggesting small hematoma or mass.  Low-density foci in the spleen.  Consider short-term follow-up 3 to 6 months to ensure stability.  Tiny anterior right upper lobe pulmonary nodule unchanged.  Three-vessel coronary artery disease.  Aortic atherosclerosis. Patient was followed up by Dr. Mariam Dollar.  He was found to have a primary large central hernia of the left groin and scrotum consistent with a postoperative hydrocele.  Patient declined urology evaluation for drainage.  INTERVAL HISTORY [name]. is a 77 y.o. male who has above history reviewed by me today presents for follow up visit for management of history of colon cancer and vitamin B12 deficiency SOB has improved. He has CHF and follows up with cardiology Denies unintentional weight loss, fever, night sweats, blood in the stool, abdominal pain.   Past Medical History:  Diagnosis Date   Adenocarcinoma of colon 06/19/2013   a. moderately differentiated stage IIIc (T4bN2a M0) adenocarcinoma of colon  s/p colectomy followed by chemoRx with oxaliplatin & fluorouacil.   Anemia    CAD (coronary  artery disease)    a. 02/2015 Cath: LM nl, LAD 50/40 mid/distal, LCX 80p, RCA 40p, 30d, RPL1 40, CO 3.27, CI 1.65; b. 06/09/15 PCI: LM minor irregs, m-dLAD 50%, dLAD 40%, pLCx 80% (s/p PCI/DES 0%), pRCA 40%, dRCA 30%, 1st RPLB 40%   Chronic kidney disease, stage 3a    Chronic systolic CHF (congestive heart failure)    a. 02/2015 Echo: EF 20-25%, diff HK, mod MR, mildy dil LA, mildly dil RV w/ mod RV syst dysfxn, mildly dil RA, mod TR, PASP ; b. 09/2017 Echo: EF 40-45%, diff HK. Gr1 DD. Mild to mod MR. Mildly to mod dil LA; c. 09/2019 Echo: EF 25-30%, glob HK, mod reduced RV fxn, sev elev PASP. Mod dil LA. Mild-mod MR; d. 05/2021 Echo: EF 20-25%, glob HK, sev red RV fxn, sev BAE, sev MR/TR/TS, triv AI.   Diabetes mellitus without complication    Essential hypertension    Hypertensive heart disease    Left inguinal hernia 05/2020   Mixed Ischemic and Non-ischemic Cardiomyopathy    a. 02/2015 EF 20-25%, diff HK; b. 09/2016 EF 40-45%; c. 09/2019 EF 25-30%; d. 05/2021 Echo: EF 20-25%, glob HK.  Persistent atrial fibrillation    a. Dx 05/2021-->CHA2DS2VASc = 5-->eliquis.   Pulmonary hypertension    Severe mitral regurgitation    a. 09/2017 Echo: Mild to mod MR; b. 09/2019 Echo: mild-mod MR; c. 05/2021 Echo: Sev MR/TR/TS.   Tubular adenoma of colon     Past Surgical History:  Procedure Laterality Date   APPENDECTOMY N/A 06/19/2017   Location: [facility]; Surgeon: Claude Manges, MD   CARDIAC CATHETERIZATION Bilateral 02/24/2015   Procedure: Right/Left Heart Cath and Coronary Angiography;  Surgeon: Iran Ouch, MD;  Location: [facility] INVASIVE CV LAB;  Service: Cardiovascular;  Laterality: Bilateral;   CARDIAC CATHETERIZATION N/A 06/09/2015   Procedure: Coronary Stent Intervention (3.0 x 12 mm Xience Alpine DES to LCx);  Surgeon: Iran Ouch, MD;  Location: [facility] INVASIVE CV LAB;  Service: Cardiovascular;  Laterality: N/A   COLECTOMY  06/19/2017   Descending and proximal sigmoid colectomy, LEFT  ureterolysis, partial cecectomy, appendectomy; Location: [facility]; Surgeon: Claude Manges, MD   COLONOSCOPY     COLONOSCOPY WITH PROPOFOL N/A 12/11/2016   Procedure: COLONOSCOPY WITH PROPOFOL;  Surgeon: Christena Deem, MD;  Location: [facility] ENDOSCOPY;  Service: Endoscopy;  Laterality: N/A;   ESOPHAGOGASTRODUODENOSCOPY     PORT-A-CATH REMOVAL N/A 07/12/2014   Procedure: REMOVAL PORT-A-CATH;  Surgeon: Duwaine Maxin, MD;  Location: [facility] ORS;  Service: General;  Laterality: N/A;   PORTACATH PLACEMENT Left 2015   Family History  Problem Relation Age of Onset   Cancer Mother    Social History   Socioeconomic History   Marital status: Married    Spouse name: Carney Bern   Number of children: 4   Years of education: Not on file   Highest education level: Not on file  Occupational History   Occupation: retired  Tobacco Use   Smoking status: Never   Smokeless tobacco: Never  Vaping Use   Vaping Use: Never used  Substance and Sexual Activity   Alcohol use: Not Currently    Comment: none in many years   Drug use: No   Sexual activity: Yes  Other Topics Concern   Not on file  Social History Narrative   Not on file   Social Determinants of Health   Financial Resource Strain: Low Risk  (10/02/2021)   Overall Financial Resource Strain (CARDIA)    Difficulty of Paying Living Expenses: Not hard at all  Food Insecurity: No Food Insecurity (10/02/2021)   Hunger Vital Sign    Worried About Running Out of Food in the Last Year: Never true    Ran Out of Food in the Last Year: Never true  Transportation Needs: No Transportation Needs (10/02/2021)   PRAPARE - Administrator, Civil Service (Medical): No    Lack of Transportation (Non-Medical): No  Physical Activity: Not on file  Stress: No Stress Concern Present (10/02/2021)   [employer] of Occupational Health - Occupational Stress Questionnaire    Feeling of Stress : Not at all  Social Connections: Unknown (10/02/2021)    Social Connection and Isolation Panel [NHANES]    Frequency of Communication with Friends and Family: Not on file    Frequency of Social Gatherings with Friends and Family: Not on file    Attends Religious Services: Not on file    Active Member of Clubs or Organizations: Not on file    Attends Banker Meetings: Not on file    Marital Status: Married  Intimate Partner Violence: Not At Risk (10/02/2021)   Humiliation, Afraid, Rape,  and Kick questionnaire    Fear of Current or Ex-Partner: No    Emotionally Abused: No    Physically Abused: No    Sexually Abused: No    Allergies:  Allergies  Allergen Reactions   Shellfish Allergy Other (See Comments) and Hives    Pt. instructed by MD to avoid seafood    Current Medications: Current Outpatient Medications  Medication Sig Dispense Refill   atorvastatin (LIPITOR) 40 MG tablet Take 1 tablet (40 mg total) by mouth daily. 90 tablet 3   azelastine (ASTELIN) 0.1 % nasal spray Place 2 sprays into both nostrils 2 (two) times daily. Use in each nostril as directed 30 mL 12   carvedilol (COREG) 3.125 MG tablet Take 1 tablet (3.125 mg total) by mouth 2 (two) times daily with a meal. 180 tablet 3   cholecalciferol (VITAMIN D3) 25 MCG (1000 UNIT) tablet Take 1,000 Units by mouth daily.     dapagliflozin propanediol (FARXIGA) 10 MG TABS tablet Take 1 tablet (10 mg total) by mouth daily before breakfast. 30 tablet 6   ferrous sulfate 325 (65 FE) MG tablet Take 325 mg by mouth daily with breakfast.     gabapentin (NEURONTIN) 100 MG capsule Take 100 mg by mouth 3 (three) times daily. Taking 2 -100 mg pills in am and 1 -100 mg at night     loratadine (CLARITIN) 10 MG tablet TAKE 1 TABLET(10 MG) BY MOUTH DAILY 30 tablet 11   losartan (COZAAR) 50 MG tablet Take 1 tablet (50 mg total) by mouth daily. 30 tablet 3   Magnesium Oxide 400 MG CAPS Take 1 capsule (400 mg total) by mouth daily. 90 capsule 3   metFORMIN (GLUCOPHAGE) 500 MG tablet TAKE 1  TABLET(500 MG) BY MOUTH TWICE DAILY WITH A MEAL 180 tablet 0   potassium chloride SA (KLOR-CON M) 20 MEQ tablet TAKE 2 TABLETS(40 MEQ) BY MOUTH DAILY 60 tablet 6   spironolactone (ALDACTONE) 25 MG tablet TAKE 1 TABLET(25 MG) BY MOUTH DAILY 30 tablet 5   torsemide (DEMADEX) 20 MG tablet Take 20 mg by mouth daily.     warfarin (COUMADIN) 5 MG tablet TAKE 1/2(2.5MG ) TO 1 TABLET(5 MG) BY MOUTH EVERY DAY AS DIRECTED BY COUMADIN CLINIC 50 tablet 1   No current facility-administered medications for this visit.   Facility-Administered Medications Ordered in Other Visits  Medication Dose Route Frequency Provider Last Rate Last Admin   cyanocobalamin (VITAMIN B12) injection 1,000 mcg  1,000 mcg Intramuscular Q30 days Rickard Patience, MD   1,000 mcg at 05/29/22 1126    Review of Systems  Constitutional:  Negative for chills, fever, malaise/fatigue and weight loss.  HENT:  Negative for sore throat.   Eyes:  Negative for redness.  Respiratory:  Negative for cough, shortness of breath and wheezing.   Cardiovascular:  Negative for chest pain and palpitations.  Gastrointestinal:  Negative for abdominal pain, blood in stool, nausea and vomiting.  Genitourinary:  Negative for dysuria.  Musculoskeletal:  Negative for myalgias.  Skin:  Negative for rash.  Neurological:  Negative for dizziness, tingling and tremors.  Endo/Heme/Allergies:  Does not bruise/bleed easily.  Psychiatric/Behavioral:  Negative for hallucinations.    Performance status (ECOG): 0  Vital Signs: BP (!) 138/50 (BP Location: Right Arm, Patient Position: Sitting, Cuff Size: Normal)   Pulse 75   Temp (!) 95 F (35 C) (Tympanic)   Wt 154 lb 6.4 oz (70 kg)   SpO2 98%   BMI 22.80 kg/m   Physical  Exam Constitutional:      General: He is not in acute distress.    Appearance: He is not diaphoretic.  HENT:     Head: Normocephalic and atraumatic.  Eyes:     General: No scleral icterus.    Pupils: Pupils are equal, round, and reactive to  light.  Cardiovascular:     Rate and Rhythm: Normal rate.  Pulmonary:     Effort: Pulmonary effort is normal. No respiratory distress.     Comments: Decreased breath sound bilaterally. Abdominal:     General: There is no distension.     Palpations: Abdomen is soft.     Tenderness: There is no abdominal tenderness.  Musculoskeletal:        General: Normal range of motion.     Cervical back: Normal range of motion and neck supple.     Comments: Chronic lower extremity edema +1  Skin:    General: Skin is warm and dry.     Findings: No erythema.     Comments: Chronic dermatological changes due to vein insufficiency  Neurological:     Mental Status: He is alert and oriented to person, place, and time. Mental status is at baseline.     Cranial Nerves: No cranial nerve deficit.     Motor: No abnormal muscle tone.     Coordination: Coordination normal.  Psychiatric:        Mood and Affect: Affect normal.        Judgment: Judgment normal.    RADIOGRAPHIC STUDIES: I have personally reviewed the radiological images as listed and agreed with the findings in the report. MR CARDIAC MORPHOLOGY W WO CONTRAST  Result Date: 04/11/2022 CLINICAL DATA:  Cardiomyopathy, evaluate for non compaction EXAM: CARDIAC MRI TECHNIQUE: The patient was scanned on a 1.5 Tesla Siemens magnet. A dedicated cardiac coil was used. Functional imaging was done using Fiesta sequences. 2,3, and 4 chamber views were done to assess for RWMA's. Modified Simpson's rule using a short axis stack was used to calculate an ejection fraction on a dedicated work Research officer, trade [employer]. The patient received 10 cc of Gadavist. After 10 minutes inversion recovery sequences were used to assess for infiltration and scar tissue. Velocity flow mapping performed in the ascending aorta and main pulmonary artery. CONTRAST:  10 cc  of Gadavist FINDINGS: 1. Moderately dilated ventricular size, severely reduced systolic function (LVEF = 24%).  There is global hypokinesis with no regional wall motion abnormalities. There is LV septal, mid wall late gadolinium enhancement. There is midwall LV trabeculations but no evidence for non compaction. [place]:C ratio 1.3:1. Normal LV wall thickness. LVEDV: 304 ml LVESV: 232 SV: 72 ml CO: 3.6 L/min Myocardial mass: 186g 2. Mild to moderately dilated right ventricular size, normal thickness. Severely reduced systolic function (RVEF = 28%). There is global hypokinesis with no regional wall motion abnormalities. 3.  Moderately dilated left atrium, mildly dilated right atrium. 4. Normal size of the aortic root, ascending aorta and pulmonary artery. No evidence for cardiac shunting 5.  No significant valvular abnormalities. 6.  Normal pericardium.  Trace pericardial effusion. IMPRESSION: 1. Moderately dilated left ventricle. Severely reduced LV function. LVEF 24%. 2.  Mid wall, LV septal LGE (scar).  Non ischemic pattern. 3.  LV trabeculations without evidence for LV non-compaction. 4.  Severely reduced RV function. 5.  Findings consistent with non-ischemic Dilated Cardiomyopathy. Electronically Signed   By: Debbe Odea M.D.   On: 04/11/2022 18:02   MR CARDIAC VELOCITY FLOW MAP  Result Date: 04/11/2022 CLINICAL DATA:  Cardiomyopathy, evaluate for non compaction EXAM: CARDIAC MRI TECHNIQUE: The patient was scanned on a 1.5 Tesla Siemens magnet. A dedicated cardiac coil was used. Functional imaging was done using Fiesta sequences. 2,3, and 4 chamber views were done to assess for RWMA's. Modified Simpson's rule using a short axis stack was used to calculate an ejection fraction on a dedicated work Research officer, trade union. The patient received 10 cc of Gadavist. After 10 minutes inversion recovery sequences were used to assess for infiltration and scar tissue. Velocity flow mapping performed in the ascending aorta and main pulmonary artery. CONTRAST:  10 cc  of Gadavist FINDINGS: 1. Moderately dilated ventricular  size, severely reduced systolic function (LVEF = 24%). There is global hypokinesis with no regional wall motion abnormalities. There is LV septal, mid wall late gadolinium enhancement. There is midwall LV trabeculations but no evidence for non compaction. [place]:C ratio 1.3:1. Normal LV wall thickness. LVEDV: 304 ml LVESV: 232 SV: 72 ml CO: 3.6 L/min Myocardial mass: 186g 2. Mild to moderately dilated right ventricular size, normal thickness. Severely reduced systolic function (RVEF = 28%). There is global hypokinesis with no regional wall motion abnormalities. 3.  Moderately dilated left atrium, mildly dilated right atrium. 4. Normal size of the aortic root, ascending aorta and pulmonary artery. No evidence for cardiac shunting 5.  No significant valvular abnormalities. 6.  Normal pericardium.  Trace pericardial effusion. IMPRESSION: 1. Moderately dilated left ventricle. Severely reduced LV function. LVEF 24%. 2.  Mid wall, LV septal LGE (scar).  Non ischemic pattern. 3.  LV trabeculations without evidence for LV non-compaction. 4.  Severely reduced RV function. 5.  Findings consistent with non-ischemic Dilated Cardiomyopathy. Electronically Signed   By: Debbe Odea M.D.   On: 04/11/2022 18:02   VAS [place] ABI WITH/WO TBI  Result Date: 03/28/2022  LOWER EXTREMITY DOPPLER STUDY Patient Name:  [name].  Date of Exam:   03/22/2022 Medical Rec #: [id number]          Accession #:    [id number] Date of Birth: 06/22/1945         Patient Gender: M Patient Age:   23 years Exam Location:  [facility] Procedure:      VAS [place] ABI WITH/WO TBI Referring Phys: Levora Dredge --------------------------------------------------------------------------------  Indications: Peripheral artery disease, and Decreased pulses.  Comparison Study: 01/09/2018 Performing Technologist: Debbe Bales RVS  Examination Guidelines: A complete evaluation includes at minimum, Doppler waveform signals and systolic blood  pressure reading at the level of bilateral brachial, anterior tibial, and posterior tibial arteries, when vessel segments are accessible. Bilateral testing is considered an integral part of a complete examination. Photoelectric Plethysmograph (PPG) waveforms and toe systolic pressure readings are included as required and additional duplex testing as needed. Limited examinations for reoccurring indications may be performed as noted.  ABI Findings: +---------+------------------+-----+--------+--------+ Right    Rt Pressure (mmHg)IndexWaveformComment  +---------+------------------+-----+--------+--------+ Brachial 139                                     +---------+------------------+-----+--------+--------+ ATA      83                0.60 biphasic         +---------+------------------+-----+--------+--------+ PTA      90                0.65  biphasic         +---------+------------------+-----+--------+--------+ Great Toe48                0.35 Normal           +---------+------------------+-----+--------+--------+ +---------+------------------+-----+---------+-------+ Left     Lt Pressure (mmHg)IndexWaveform Comment +---------+------------------+-----+---------+-------+ Brachial 133                                     +---------+------------------+-----+---------+-------+ ATA      82                0.59 biphasic         +---------+------------------+-----+---------+-------+ PTA      138               0.99 triphasic        +---------+------------------+-----+---------+-------+ Great Toe81                0.58 Abnormal         +---------+------------------+-----+---------+-------+ +-------+-----------+-----------+------------+------------+ ABI/TBIToday's ABIToday's TBIPrevious ABIPrevious TBI +-------+-----------+-----------+------------+------------+ Right  .65        .35        1.00        .53           +-------+-----------+-----------+------------+------------+ Left   .99        .58        1.00        .76          +-------+-----------+-----------+------------+------------+ Right ABIs and TBIs appear decreased compared to prior study on 01/09/2018. Left ABIs appear essentially unchanged compared to prior study on 01/09/2018. Left TBIs appear to be decreased compared to prior study on 01/09/2018.  Summary: Right: Resting right ankle-brachial index indicates moderate right lower extremity arterial disease. The right toe-brachial index is abnormal. Left: Resting left ankle-brachial index is within normal range. The left toe-brachial index is abnormal. *See table(s) above for measurements and observations.   Electronically signed by Levora Dredge MD on 03/28/2022 at 3:42:31 PM.    Final       Labs    Latest Ref Rng & Units 05/16/2022   10:06 AM 04/04/2022    2:33 PM 06/01/2021    6:18 AM  CBC  WBC 4.0 - 10.5 K/uL 2.6  3.1  4.2   Hemoglobin 13.0 - 17.0 g/dL 82.9  56.2  13.0   Hematocrit 39.0 - 52.0 % 38.0  31.6  36.6   Platelets 150 - 400 K/uL 116  129  95       Latest Ref Rng & Units 05/16/2022   10:06 AM 04/04/2022    2:33 PM 12/12/2021   12:08 PM  CMP  Glucose 70 - 99 mg/dL 98  865  784   BUN 8 - 23 mg/dL Creatinine 0.61 - 1.24 mg/dL 6.96  2.95  2.84   Sodium 135 - 145 mmol/L 140  141  141   Potassium 3.5 - 5.1 mmol/L 5.0  4.6  4.5   Chloride 98 - 111 mmol/L 112  111  111   CO2 22 - 32 mmol/L Calcium 8.9 - 10.3 mg/dL 9.4  8.8  9.8   Total Protein 6.5 - 8.1 g/dL 7.7     Total Bilirubin 0.3 - 1.2 mg/dL 1.0     Alkaline Phos 38 - 126 U/L 85     AST  15 - 41 U/L 21     ALT 0 - 44 U/L 17

## 2022-05-29 NOTE — Assessment & Plan Note (Addendum)
#   Stage IIIC colon cancer 05/11/2021, CT abdomen pelvis without contrast showed new anasarca.  No other acute findings identified.  No evidence of recurrence or metastatic carcinoma.  Mild decrease in the size of 5.2 cm fluid collection in the left inguinal canal.  Given that patient is 7+ years after initial cancer diagnosis and treatments, I will hold off additional surveillance CT scan unless clinically indicated. Labs are reviewed and discussed with patient. CEA remains stable.  Continue clinical surveillance.

## 2022-05-29 NOTE — Assessment & Plan Note (Signed)
continue monthly vitamin B12 injection.

## 2022-05-30 ENCOUNTER — Ambulatory Visit: Payer: PPO | Attending: Cardiovascular Disease

## 2022-05-30 DIAGNOSIS — I4819 Other persistent atrial fibrillation: Secondary | ICD-10-CM | POA: Diagnosis not present

## 2022-05-30 DIAGNOSIS — Z7901 Long term (current) use of anticoagulants: Secondary | ICD-10-CM

## 2022-05-30 LAB — POCT INR: INR: 2.7 (ref 2.0–3.0)

## 2022-05-30 NOTE — Patient Instructions (Signed)
Description   Continue on same dosage of Warfarin 0.5 TABLET DAILY, EXCEPT 1 TABLET ON MONDAYS AND FRIDAYS. Recheck INR in 6 weeks.   [phone]     

## 2022-06-28 ENCOUNTER — Inpatient Hospital Stay: Payer: PPO | Attending: Oncology

## 2022-06-28 DIAGNOSIS — E538 Deficiency of other specified B group vitamins: Secondary | ICD-10-CM | POA: Diagnosis present

## 2022-06-28 DIAGNOSIS — Z85038 Personal history of other malignant neoplasm of large intestine: Secondary | ICD-10-CM | POA: Diagnosis present

## 2022-06-28 MED ORDER — CYANOCOBALAMIN 1000 MCG/ML IJ SOLN
1000.0000 ug | INTRAMUSCULAR | Status: DC
  Administered 2022-06-28: 1000 ug via INTRAMUSCULAR
  Filled 2022-06-28: qty 1

## 2022-07-11 ENCOUNTER — Ambulatory Visit: Payer: PPO | Attending: Cardiovascular Disease

## 2022-07-11 DIAGNOSIS — Z7901 Long term (current) use of anticoagulants: Secondary | ICD-10-CM

## 2022-07-11 DIAGNOSIS — I4819 Other persistent atrial fibrillation: Secondary | ICD-10-CM

## 2022-07-11 LAB — POCT INR: INR: 1.8 — AB (ref 2.0–3.0)

## 2022-07-11 NOTE — Patient Instructions (Signed)
TAKE ANOTHER 1 TABLET TODAY THEN Continue on same dosage of Warfarin 0.5 TABLET DAILY, EXCEPT 1 TABLET ON MONDAYS AND FRIDAYS. Recheck INR in 4 weeks.   [phone]

## 2022-07-22 ENCOUNTER — Other Ambulatory Visit: Payer: Self-pay | Admitting: Cardiovascular Disease

## 2022-07-30 ENCOUNTER — Inpatient Hospital Stay: Payer: PPO

## 2022-07-30 ENCOUNTER — Inpatient Hospital Stay: Payer: PPO | Attending: Oncology

## 2022-07-30 DIAGNOSIS — Z85038 Personal history of other malignant neoplasm of large intestine: Secondary | ICD-10-CM | POA: Insufficient documentation

## 2022-07-30 DIAGNOSIS — E538 Deficiency of other specified B group vitamins: Secondary | ICD-10-CM | POA: Diagnosis present

## 2022-07-30 MED ORDER — CYANOCOBALAMIN 1000 MCG/ML IJ SOLN
1000.0000 ug | INTRAMUSCULAR | Status: DC
  Administered 2022-07-30: 1000 ug via INTRAMUSCULAR
  Filled 2022-07-30: qty 1

## 2022-08-08 ENCOUNTER — Ambulatory Visit: Payer: PPO | Attending: Cardiovascular Disease

## 2022-08-08 DIAGNOSIS — I4819 Other persistent atrial fibrillation: Secondary | ICD-10-CM | POA: Diagnosis not present

## 2022-08-08 DIAGNOSIS — Z7901 Long term (current) use of anticoagulants: Secondary | ICD-10-CM | POA: Diagnosis not present

## 2022-08-08 LAB — POCT INR: INR: 2 (ref 2.0–3.0)

## 2022-08-08 NOTE — Patient Instructions (Signed)
TAKE ANOTHER 0.5 TABLET TODAY THEN Continue on same dosage of Warfarin 0.5 TABLET DAILY, EXCEPT 1 TABLET ON MONDAYS AND FRIDAYS. Recheck INR in 4 weeks.   [phone]

## 2022-08-21 ENCOUNTER — Ambulatory Visit: Payer: PPO | Admitting: Internal Medicine

## 2022-08-21 ENCOUNTER — Encounter: Payer: Self-pay | Admitting: Internal Medicine

## 2022-08-21 ENCOUNTER — Other Ambulatory Visit: Payer: Self-pay | Admitting: Family Medicine

## 2022-08-21 ENCOUNTER — Other Ambulatory Visit
Admission: RE | Admit: 2022-08-21 | Discharge: 2022-08-21 | Disposition: A | Discharge status: Home / Self Care | Payer: PPO | Source: Ambulatory Visit | Attending: Internal Medicine | Admitting: Internal Medicine

## 2022-08-21 VITALS — BP 140/54 | HR 85 | Resp 14 | Wt 160.1 lb

## 2022-08-21 DIAGNOSIS — I10 Essential (primary) hypertension: Secondary | ICD-10-CM | POA: Diagnosis not present

## 2022-08-21 DIAGNOSIS — I2510 Atherosclerotic heart disease of native coronary artery without angina pectoris: Secondary | ICD-10-CM

## 2022-08-21 DIAGNOSIS — I5022 Chronic systolic (congestive) heart failure: Secondary | ICD-10-CM | POA: Insufficient documentation

## 2022-08-21 DIAGNOSIS — N183 Chronic kidney disease, stage 3 unspecified: Secondary | ICD-10-CM | POA: Diagnosis not present

## 2022-08-21 DIAGNOSIS — I4891 Unspecified atrial fibrillation: Secondary | ICD-10-CM

## 2022-08-21 LAB — BASIC METABOLIC PANEL
Anion gap: 9 (ref 5–15)
BUN: 36 mg/dL — ABNORMAL HIGH (ref 8–23)
CO2: 26 mmol/L (ref 22–32)
Calcium: 9.1 mg/dL (ref 8.9–10.3)
Chloride: 106 mmol/L (ref 98–111)
Creatinine, Ser: 1.92 mg/dL — ABNORMAL HIGH (ref 0.61–1.24)
GFR, Estimated: 36 mL/min — ABNORMAL LOW (ref >60)
Glucose, Bld: 105 mg/dL — ABNORMAL HIGH (ref 70–99)
Potassium: 3.8 mmol/L (ref 3.5–5.1)
Sodium: 141 mmol/L (ref 135–145)

## 2022-08-21 LAB — CBC
HCT: 33.9 % — ABNORMAL LOW (ref 39.0–52.0)
Hemoglobin: 11.3 g/dL — ABNORMAL LOW (ref 13.0–17.0)
MCH: 32.4 pg (ref 26.0–34.0)
MCHC: 33.3 g/dL (ref 30.0–36.0)
MCV: 97.1 fL (ref 80.0–100.0)
Platelets: 101 10*3/uL — ABNORMAL LOW (ref 150–400)
RBC: 3.49 MIL/uL — ABNORMAL LOW (ref 4.22–5.81)
RDW: 13.5 % (ref 11.5–15.5)
WBC: 3.4 10*3/uL — ABNORMAL LOW (ref 4.0–10.5)
nRBC: 0 % (ref 0.0–0.2)

## 2022-08-21 LAB — PROTIME-INR
INR: 1.5 — ABNORMAL HIGH (ref 0.8–1.2)
Prothrombin Time: 17.8 seconds — ABNORMAL HIGH (ref 11.4–15.2)

## 2022-08-21 LAB — BRAIN NATRIURETIC PEPTIDE: B Natriuretic Peptide: 791.1 pg/mL — ABNORMAL HIGH (ref 0.0–100.0)

## 2022-08-21 MED ORDER — LOSARTAN POTASSIUM 100 MG PO TABS: 100.0000 mg | ORAL_TABLET | Freq: Every day | ORAL | 6 refills | Status: DC

## 2022-08-21 NOTE — Addendum Note (Signed)
Addended by: Electa Sniff on: 08/21/2022 11:30 AM   Modules accepted: Orders

## 2022-08-21 NOTE — Progress Notes (Signed)
ADVANCED [facility] CLINIC CONSULT NOTE  Referring Physician: Ward Givens, NP Primary Care: Glori Luis, MD Primary Cardiologist: Lorine Bears, MD   HPI:  [name] is a 77 year old male with a history of CAD status post circumflex stenting, chronic HFrEF, mixed ischemic and nonischemic cardiomyopathy, hypertension, hyperlipidemia, stage III chronic kidney disease, and colon cancer, who presents for heart failure follow-up.   Found to have colon CA in 2015. Underwent colectomy by chemotherapy with oxaliplatin and fluorouracil.   In January 2017, he was diagnosed with systolic heart failure. Echo EF 20-25% with moderate MR/TR, and moderate to severe pulmonary hypertension.  Right and left heart cardiac catheterization showed moderate to severe pulmonary hypertension with severely elevated filling pressures and severely reduced cardiac output.  Coronary angiography showed severe one-vessel CAD with an 80 to 90% stenosis in the proximal left circumflex, and mild diffuse disease involving the LAD and RCA.  He underwent PCI of the left circumflex in May 2017.  Follow-up echo in November 2020, showed an EF of 30 to 35%.  In the setting of PVCs, outpatient monitoring was performed and showed a 6% PVC burden, which improved with resumption of carvedilol.    RHC 1/17  RA = 9  PA = 70/24 (40) PCWP = 30  (v = 35) Fick CO/CI = 3.3/1.3 PVR = 3.0 WU  Echo in April 2021 showed persistent LV dysfunction EF 25-30%, severe PAH, mild to moderate MR.  He was offered EP referral for ICD therapy however, he declined. In April 2023, he required admission secondary to massive volume overload and weight gain despite titration of outpatient diuretics.  Echo 4/23  EF 20-25% with severely dilated left ventricle, severely reduced RV function, severe biatrial lodgment, severe MR/TR, circumferential pericardial effusion, and trivial AI. Discharged home at 151 pounds.    He has previously declined EP referral for ICD.  He was unable to afford Entresto but was willing to start losartan  He saw Dr. Gasper Lloyd in 2/24 and was stable. Losartan increased and Farxiga added.   He for f/u. Doing well. No CP or SOB. Able to mow his yard with push mower. No edema, orthopnea or PND. Compliant with meds. No bleeding with warfarin.    FHx:   Mother died when was 58 months old Unsure how his Dad passed 2 half brothers - he doesn't know them Doesn't know much about his family 4 kids - 67 grandkids    Past Medical History:  Diagnosis Date   Adenocarcinoma of colon (HCC) 06/19/2013   a. moderately differentiated stage IIIc (T4bN2a M0) adenocarcinoma of colon  s/p colectomy followed by chemoRx with oxaliplatin & fluorouacil.   Anemia    CAD (coronary artery disease)    a. 02/2015 Cath: LM nl, LAD 50/40 mid/distal, LCX 80p, RCA 40p, 30d, RPL1 40, CO 3.27, CI 1.65; b. 06/09/15 PCI: LM minor irregs, m-dLAD 50%, dLAD 40%, pLCx 80% (s/p PCI/DES 0%), pRCA 40%, dRCA 30%, 1st RPLB 40%   Chronic kidney disease, stage 3a (HCC)    Chronic systolic CHF (congestive heart failure) (HCC)    a. 02/2015 Echo: EF 20-25%, diff HK, mod MR, mildy dil LA, mildly dil RV w/ mod RV syst dysfxn, mildly dil RA, mod TR, PASP ; b. 09/2017 Echo: EF 40-45%, diff HK. Gr1 DD. Mild to mod MR. Mildly to mod dil LA; c. 09/2019 Echo: EF 25-30%, glob HK, mod reduced RV fxn, sev elev PASP. Mod dil LA. Mild-mod MR; d. 05/2021 Echo: EF 20-25%, glob  HK, sev red RV fxn, sev BAE, sev MR/TR/TS, triv AI.   Diabetes mellitus without complication (HCC)    Essential hypertension    Hypertensive heart disease    Left inguinal hernia 05/2020   Mixed Ischemic and Non-ischemic Cardiomyopathy    a. 02/2015 EF 20-25%, diff HK; b. 09/2016 EF 40-45%; c. 09/2019 EF 25-30%; d. 05/2021 Echo: EF 20-25%, glob HK.   Persistent atrial fibrillation (HCC)    a. Dx 05/2021-->CHA2DS2VASc = 5-->eliquis.   Pulmonary hypertension (HCC)    Severe mitral regurgitation    a. 09/2017 Echo:  Mild to mod MR; b. 09/2019 Echo: mild-mod MR; c. 05/2021 Echo: Sev MR/TR/TS.   Tubular adenoma of colon     Current Outpatient Medications  Medication Sig Dispense Refill   atorvastatin (LIPITOR) 40 MG tablet Take 1 tablet (40 mg total) by mouth daily. 90 tablet 3   carvedilol (COREG) 3.125 MG tablet TAKE 1 TABLET(3.125 MG) BY MOUTH TWICE DAILY WITH A MEAL 180 tablet 0   cholecalciferol (VITAMIN D3) 25 MCG (1000 UNIT) tablet Take 1,000 Units by mouth daily.     dapagliflozin propanediol (FARXIGA) 10 MG TABS tablet Take 1 tablet (10 mg total) by mouth daily before breakfast. 30 tablet 6   ferrous sulfate 325 (65 FE) MG tablet Take 325 mg by mouth daily with breakfast.     loratadine (CLARITIN) 10 MG tablet TAKE 1 TABLET(10 MG) BY MOUTH DAILY 30 tablet 11   losartan (COZAAR) 50 MG tablet Take 1 tablet (50 mg total) by mouth daily. 30 tablet 3   Magnesium Oxide 400 MG CAPS Take 1 capsule (400 mg total) by mouth daily. 90 capsule 3   metFORMIN (GLUCOPHAGE) 500 MG tablet TAKE 1 TABLET(500 MG) BY MOUTH TWICE DAILY WITH A MEAL 180 tablet 0   potassium chloride SA (KLOR-CON M) 20 MEQ tablet TAKE 2 TABLETS(40 MEQ) BY MOUTH DAILY 60 tablet 6   spironolactone (ALDACTONE) 25 MG tablet TAKE 1 TABLET(25 MG) BY MOUTH DAILY 30 tablet 5   torsemide (DEMADEX) 20 MG tablet Take 20 mg by mouth daily.     warfarin (COUMADIN) 5 MG tablet TAKE 1/2(2.5MG ) TO 1 TABLET(5 MG) BY MOUTH EVERY DAY AS DIRECTED BY COUMADIN CLINIC 50 tablet 1   azelastine (ASTELIN) 0.1 % nasal spray Place 2 sprays into both nostrils 2 (two) times daily. Use in each nostril as directed (Patient not taking: Reported on 08/21/2022) 30 mL 12   gabapentin (NEURONTIN) 100 MG capsule Take 100 mg by mouth 3 (three) times daily. Taking 2 -100 mg pills in am and 1 -100 mg at night (Patient not taking: Reported on 08/21/2022)     No current facility-administered medications for this visit.    Allergies  Allergen Reactions   Shellfish Allergy Other (See  Comments) and Hives    Pt. instructed by MD to avoid seafood      Social History   Socioeconomic History   Marital status: Married    Spouse name: Carney Bern   Number of children: 4   Years of education: Not on file   Highest education level: Not on file  Occupational History   Occupation: retired  Tobacco Use   Smoking status: Never   Smokeless tobacco: Never  Vaping Use   Vaping status: Never Used  Substance and Sexual Activity   Alcohol use: Not Currently    Comment: none in many years   Drug use: No   Sexual activity: Yes  Other Topics Concern   Not on  file  Social History Narrative   Not on file   Social Determinants of Health   Financial Resource Strain: Low Risk  (10/02/2021)   Overall Financial Resource Strain (CARDIA)    Difficulty of Paying Living Expenses: Not hard at all  Food Insecurity: No Food Insecurity (10/02/2021)   Hunger Vital Sign    Worried About Running Out of Food in the Last Year: Never true    Ran Out of Food in the Last Year: Never true  Transportation Needs: No Transportation Needs (10/02/2021)   PRAPARE - Administrator, Civil Service (Medical): No    Lack of Transportation (Non-Medical): No  Physical Activity: Not on file  Stress: No Stress Concern Present (10/02/2021)   [employer] of Occupational Health - Occupational Stress Questionnaire    Feeling of Stress : Not at all  Social Connections: Unknown (10/02/2021)   Social Connection and Isolation Panel [NHANES]    Frequency of Communication with Friends and Family: Not on file    Frequency of Social Gatherings with Friends and Family: Not on file    Attends Religious Services: Not on file    Active Member of Clubs or Organizations: Not on file    Attends Banker Meetings: Not on file    Marital Status: Married  Intimate Partner Violence: Not At Risk (10/02/2021)   Humiliation, Afraid, Rape, and Kick questionnaire    Fear of Current or Ex-Partner: No     Emotionally Abused: No    Physically Abused: No    Sexually Abused: No      Family History  Problem Relation Age of Onset   Cancer Mother     Vitals:   08/21/22 1047  BP: (!) 140/54  Pulse: 85  Resp: 14  SpO2: 99%  Weight: 160 lb 2 oz (72.6 kg)    PHYSICAL EXAM: General:  NAD HEENT: normal Neck: supple. no JVD. Carotids 2+ bilat; no bruits. No lymphadenopathy or thryomegaly appreciated. Cor: PMI nondisplaced. Regular rate & rhythm. No rubs, gallops or murmurs. Lungs: clear Abdomen: soft, nontender, nondistended. No hepatosplenomegaly. No bruits or masses. Good bowel sounds. Extremities: no cyanosis, clubbing, rash, edema Neuro: alert & orientedx3, cranial nerves grossly intact. moves all 4 extremities w/o difficulty. Affect pleasant   ASSESSMENT & PLAN:   1.  Chronic systolic HF due to mixed NICM/ICM - primarily NICM - Onset 1/17. Echo EF 20-25% with moderate MR/TR - Cath 1/17: severe 1v CAD with 80-90% stenosis in the proximal left circumflex -> PCI, and mild diffuse disease inLAD and RCA.  - Echo 11/20 EF 30-35%.   - Monitor with 6% PVC burden,  - Echo 4/23 EF 20-25% with severely dilated left ventricle, severely reduced RV function, severe biatrial lodgment, severe MR/TR, circumferential pericardial effusion, and trivial AI.   - cMRI 3/24 LVEF 24% the trabeculations but doesn't meet criteria for LVNC. [place] LGE (noischemic) RVEF 24% - Stable NYHA II - Volume status ok on torsemide 20 daily - Increase losartan to 100 daily (refuses Entresto due to cost)  - Continue carvedilol 3.125 bid  - Continue spiro 25 daily - Continue Farxiga 10  - Remains uninterested in ICD  2.  Persistent atrial fibrillation:  - In SR today - On warfarin because Eliquis was cost prohibitive.     3.  Coronary artery disease:  -  Status post circumflex stenting in 2017. -  No aspirin in the setting of warfarin. - No s/s angina   4.  Essential hypertension:  -  BP up. Increase  Entresto   5.  Stage IIIB chronic kidney disease:  - Scr 1.5-1.7 - Continue ARB/SGLT2i - Labs today.   Arvilla Meres, MD  10:51 AM

## 2022-08-21 NOTE — Patient Instructions (Signed)
Medication Changes:  Increase Losartan to 100 mg (1 tablet) daily.  Lab Work:  Labs done today, your results will be available in MyChart, we will contact you for abnormal readings.   Special Instructions // Education:  Do the following things EVERYDAY: Weigh yourself in the morning before breakfast. Write it down and keep it in a log. Take your medicines as prescribed Eat low salt foods--Limit salt (sodium) to 2000 mg per day.  Stay as active as you can everyday Limit all fluids for the day to less than 2 liters   Follow-Up in: please call our clinic on October to schedule your November appointment with Dr. Gala Romney.     If you have any questions or concerns before your next appointment please send [place] a message through [place] or call our office at [phone] Monday-Friday 8 am-5 pm.   If you have an urgent need after hours on the weekend please call your Primary Cardiologist or the [facility] in [place] at [phone].

## 2022-08-27 NOTE — Progress Notes (Deleted)
02/13/2018 2:14 PM   [name] Jun 24, 1945 [id number]  Referring provider: Glori Luis, MD [address] [place],  [place] [address]  Urological history: 1. High risk hematuria -non-smoker -CTU 2019 no definite source for hematuria identified on today's examination. Specifically, no urinary tract calculi and no findings of urinary tract obstruction. In addition to multiple simple cysts there are several subcentimeter low-attenuation lesions in the kidneys bilaterally. These are too small to definitively characterize, but are favored to represent small cysts -cysto 2019 enlarged prostate  2. Renal cysts -Non-contrast CT on 08/07/2018 Simple 3.1 cm posterior lower right renal cyst.  Simple 1.5 cm posterior lower left renal cyst  3. BPH with LU TS   4. Prostate nodule - 5 mm x 5 mm nodule  -incidential finding on 08/2019 exam -patient deferred further work up    Chief Complaint Patient presents with  Follow-up    HPI: Christopher Owenis a 77 y.o. male who presents today for one year follow up.    I PSS ***  UA ***   IPSS  Row Name 08/23/21 1300 International Prostate Symptom Score How often have you had the sensation of not emptying your bladder? Almost always How often have you had to urinate less than every two hours? More than half the time How often have you found you stopped and started again several times when you urinated? Less than 1 in 5 times How often have you found it difficult to postpone urination? Not at All How often have you had a weak urinary stream? Less than 1 in 5 times How often have you had to strain to start urination? Not at All How many times did you typically get up at night to urinate? 3 Times Total IPSS Score 14 Quality of Life due to urinary symptoms If you were to spend the rest of your life with your urinary condition just the way it is now how would you feel about that? Mostly  Satisfied      Score:  1-7 Mild 8-19 Moderate 20-35 Severe  PMH: Past Medical History: Diagnosis Date  Adenocarcinoma of colon (HCC) 06/19/2013 a. moderately differentiated stage IIIc (T4bN2a M0) adenocarcinoma of colon  s/p colectomy followed by chemoRx with oxaliplatin & fluorouacil.  Anemia  CAD (coronary artery disease) a. 02/2015 Cath: LM nl, LAD 50/40 mid/distal, LCX 80p, RCA 40p, 30d, RPL1 40, CO 3.27, CI 1.65; b. 06/09/15 PCI: LM minor irregs, m-dLAD 50%, dLAD 40%, pLCx 80% (s/p PCI/DES 0%), pRCA 40%, dRCA 30%, 1st RPLB 40%  Chronic kidney disease, stage 3a (HCC)  Chronic systolic CHF (congestive heart failure) (HCC) a. 02/2015 Echo: EF 20-25%, diff HK, mod MR, mildy dil LA, mildly dil RV w/ mod RV syst dysfxn, mildly dil RA, mod TR, PASP ; b. 09/2017 Echo: EF 40-45%, diff HK. Gr1 DD. Mild to mod MR. Mildly to mod dil LA; c. 09/2019 Echo: EF 25-30%, glob HK, mod reduced RV fxn, sev elev PASP. Mod dil LA. Mild-mod MR; d. 05/2021 Echo: EF 20-25%, glob HK, sev red RV fxn, sev BAE, sev MR/TR/TS, triv AI.  Diabetes mellitus without complication (HCC)  Essential hypertension  Hypertensive heart disease  Left inguinal hernia 05/2020  Mixed Ischemic and Non-ischemic Cardiomyopathy a. 02/2015 EF 20-25%, diff HK; b. 09/2016 EF 40-45%; c. 09/2019 EF 25-30%; d. 05/2021 Echo: EF 20-25%, glob HK.  Persistent atrial fibrillation (HCC) a. Dx 05/2021-->CHA2DS2VASc = 5-->eliquis.  Pulmonary hypertension (HCC)  Severe mitral regurgitation a.  09/2017 Echo: Mild to mod MR; b. 09/2019 Echo: mild-mod MR; c. 05/2021 Echo: Sev MR/TR/TS.  Tubular adenoma of colon   Surgical History: Past Surgical History: Procedure Laterality Date  APPENDECTOMY N/A 06/19/2017 Location: [facility]; Surgeon: Claude Manges, MD  CARDIAC CATHETERIZATION Bilateral 02/24/2015 Procedure: Right/Left Heart Cath and Coronary Angiography;  Surgeon: Iran Ouch, MD;  Location: [facility] INVASIVE CV LAB;   Service: Cardiovascular;  Laterality: Bilateral;  CARDIAC CATHETERIZATION N/A 06/09/2015 Procedure: Coronary Stent Intervention (3.0 x 12 mm Xience Alpine DES to LCx);  Surgeon: Iran Ouch, MD;  Location: [facility] INVASIVE CV LAB;  Service: Cardiovascular;  Laterality: N/A  COLECTOMY 06/19/2017 Descending and proximal sigmoid colectomy, LEFT ureterolysis, partial cecectomy, appendectomy; Location: [facility]; Surgeon: Claude Manges, MD  COLONOSCOPY  COLONOSCOPY WITH PROPOFOL N/A 12/11/2016 Procedure: COLONOSCOPY WITH PROPOFOL;  Surgeon: Christena Deem, MD;  Location: [facility] ENDOSCOPY;  Service: Endoscopy;  Laterality: N/A;  ESOPHAGOGASTRODUODENOSCOPY  PORT-A-CATH REMOVAL N/A 07/12/2014 Procedure: REMOVAL PORT-A-CATH;  Surgeon: Duwaine Maxin, MD;  Location: [facility] ORS;  Service: General;  Laterality: N/A;  PORTACATH PLACEMENT Left 2015   Home Medications:  Allergies as of 08/23/2021     Reactions Shellfish Allergy Other (See Comments), Hives Pt. instructed by MD to avoid seafood   Medication List   Accurate as of August 23, 2021  2:14 PM. If you have any questions, ask your nurse or doctor.   atorvastatin 40 MG tablet Commonly known as: LIPITOR Take 1 tablet (40 mg total) by mouth daily. carvedilol 3.125 MG tablet Commonly known as: COREG Take 1 tablet (3.125 mg total) by mouth 2 (two) times daily with a meal. cholecalciferol 25 MCG (1000 UNIT) tablet Commonly known as: VITAMIN D3 Take 1,000 Units by mouth daily. docusate sodium 100 MG capsule Commonly known as: Colace Take 1 capsule (100 mg total) by mouth daily as needed for mild constipation. Eliquis 5 MG Tabs tablet Generic drug: apixaban TAKE 1 TABLET(5 MG) BY MOUTH TWICE DAILY Entresto 24-26 MG Generic drug: sacubitril-valsartan Take 1 tablet by mouth 2 (two) times daily. ferrous sulfate 325 (65 FE) MG tablet Take 325 mg by mouth daily with breakfast. gabapentin 100 MG capsule Commonly known  as: NEURONTIN Take 100 mg by mouth 3 (three) times daily. Taking 2 -100 mg pills in am and 1 -100 mg at night Lidocaine 4 % Gel Apply 1 application. topically 4 (four) times daily as needed. loratadine 10 MG tablet Commonly known as: CLARITIN Take 1 tablet (10 mg total) by mouth daily. potassium chloride SA 20 MEQ tablet Commonly known as: Klor-Con M20 Take 2 tablets (40 mEq total) by mouth daily. spironolactone 25 MG tablet Commonly known as: ALDACTONE Take 1 tablet (25 mg total) by mouth daily. torsemide 20 MG tablet Commonly known as: DEMADEX Take 3 tablets (60 mg total) by mouth 2 (two) times daily. Take 4 tablets (80 mg) twice a day for 3 days THEN take 3 tablets (60 mg) twice a day    Allergies:  Allergies Allergen Reactions  Shellfish Allergy Other (See Comments) and Hives Pt. instructed by MD to avoid seafood   Family History: Family History Problem Relation Age of Onset  Cancer Mother   Social History:  reports that he has never smoked. He has never used smokeless tobacco. He reports that he does not currently use alcohol. He reports that he does not use drugs.  ROS: For pertinent review of systems please refer to history of present illness  Physical Exam: BP 129/60   Pulse Marland Kitchen)  57   Ht 5\' 9"  (1.753 m)   Wt 156 lb (70.8 kg)   BMI 23.04 kg/m   Constitutional:  Well nourished. Alert and oriented, No acute distress. HEENT: [place] AT, moist mucus membranes.  Trachea midline Cardiovascular: No clubbing, cyanosis, or edema. Respiratory: Normal respiratory effort, no increased work of breathing. GU: No CVA tenderness.  No bladder fullness or masses.  Patient with circumcised/uncircumcised phallus. ***Foreskin easily retracted***  Urethral meatus is patent.  No penile discharge. No penile lesions or rashes. Scrotum without lesions, cysts, rashes and/or edema.  Testicles are located scrotally bilaterally. No masses are appreciated in the testicles. Left and right  epididymis are normal. Rectal: Patient with  normal sphincter tone. Anus and perineum without scarring or rashes. No rectal masses are appreciated. Prostate is approximately *** grams, *** nodules are appreciated. Seminal vesicles are normal. Neurologic: Grossly intact, no focal deficits, moving all 4 extremities. Psychiatric: Normal mood and affect.   Laboratory Data:   Latest Ref Rng & Units 08/21/2022   11:42 AM 05/16/2022   10:06 AM 04/04/2022    2:33 PM BMP Glucose 70 - 99 mg/dL 161  98  096  BUN 8 - 23 mg/dL 36  21  21  Creatinine 0.61 - 1.24 mg/dL 0.45  4.09  8.11  Sodium 135 - 145 mmol/L 141  140  141  Potassium 3.5 - 5.1 mmol/L 3.8  5.0  4.6  Chloride 98 - 111 mmol/L 106  112  111  CO2 22 - 32 mmol/L 26  23  24   Calcium 8.9 - 10.3 mg/dL 9.1  9.4  8.8     Urinalysis *** I have reviewed the labs.  Pertinent Imaging: N/A  Assessment & Plan:    1. High Risk Hematuria -non-smoker -Hematuria work up completed in 09/2017- findings positive for renal cysts and enlarged prostate -no reports of gross heme -UA ***  2. BPH with LUTS -Continue conservative management, avoiding bladder irritants and timed voiding's  3. Prostate nodule -discontinued screening   Return in about 1 year (around 08/24/2022) for IPSS, PVR and exam.  These notes generated with voice recognition software. I apologize for typographical errors.  [facility] [address]  [place], [place] [address] [phone]   Michiel Cowboy, PA-C

## 2022-08-28 ENCOUNTER — Inpatient Hospital Stay: Payer: PPO | Attending: Oncology

## 2022-08-28 ENCOUNTER — Ambulatory Visit: Payer: PPO | Admitting: Urology

## 2022-08-28 DIAGNOSIS — Z85038 Personal history of other malignant neoplasm of large intestine: Secondary | ICD-10-CM | POA: Insufficient documentation

## 2022-08-28 DIAGNOSIS — N402 Nodular prostate without lower urinary tract symptoms: Secondary | ICD-10-CM

## 2022-08-28 DIAGNOSIS — E538 Deficiency of other specified B group vitamins: Secondary | ICD-10-CM | POA: Diagnosis not present

## 2022-08-28 DIAGNOSIS — N401 Enlarged prostate with lower urinary tract symptoms: Secondary | ICD-10-CM

## 2022-08-28 DIAGNOSIS — R319 Hematuria, unspecified: Secondary | ICD-10-CM

## 2022-08-28 MED ORDER — CYANOCOBALAMIN 1000 MCG/ML IJ SOLN
1000.0000 ug | INTRAMUSCULAR | Status: DC
  Administered 2022-08-28: 1000 ug via INTRAMUSCULAR
  Filled 2022-08-28: qty 1

## 2022-09-05 ENCOUNTER — Ambulatory Visit: Payer: PPO | Attending: Cardiovascular Disease

## 2022-09-05 DIAGNOSIS — I4819 Other persistent atrial fibrillation: Secondary | ICD-10-CM | POA: Diagnosis not present

## 2022-09-05 DIAGNOSIS — Z7901 Long term (current) use of anticoagulants: Secondary | ICD-10-CM

## 2022-09-05 LAB — POCT INR: INR: 2 (ref 2.0–3.0)

## 2022-09-05 NOTE — Patient Instructions (Signed)
Continue on same dosage of Warfarin 0.5 TABLET DAILY, EXCEPT 1 TABLET ON MONDAYS AND FRIDAYS. Recheck INR in 4 weeks.   [phone]

## 2022-09-18 DIAGNOSIS — E875 Hyperkalemia: Secondary | ICD-10-CM | POA: Insufficient documentation

## 2022-09-20 ENCOUNTER — Ambulatory Visit (INDEPENDENT_AMBULATORY_CARE_PROVIDER_SITE_OTHER): Payer: PPO

## 2022-09-20 ENCOUNTER — Ambulatory Visit (INDEPENDENT_AMBULATORY_CARE_PROVIDER_SITE_OTHER): Payer: PPO | Admitting: Vascular Surgery

## 2022-09-20 DIAGNOSIS — I70213 Atherosclerosis of native arteries of extremities with intermittent claudication, bilateral legs: Secondary | ICD-10-CM

## 2022-09-24 LAB — VAS US ABI WITH/WO TBI
Left ABI: 1.03
Right ABI: 0.92

## 2022-09-28 ENCOUNTER — Inpatient Hospital Stay: Payer: PPO | Attending: Oncology

## 2022-09-28 DIAGNOSIS — E538 Deficiency of other specified B group vitamins: Secondary | ICD-10-CM | POA: Diagnosis present

## 2022-09-28 DIAGNOSIS — Z85038 Personal history of other malignant neoplasm of large intestine: Secondary | ICD-10-CM | POA: Diagnosis present

## 2022-09-28 MED ORDER — CYANOCOBALAMIN 1000 MCG/ML IJ SOLN
1000.0000 ug | INTRAMUSCULAR | Status: DC
  Administered 2022-09-28: 1000 ug via INTRAMUSCULAR
  Filled 2022-09-28: qty 1

## 2022-10-03 ENCOUNTER — Other Ambulatory Visit: Payer: Self-pay

## 2022-10-03 ENCOUNTER — Ambulatory Visit: Payer: PPO | Attending: Cardiovascular Disease

## 2022-10-03 DIAGNOSIS — Z7901 Long term (current) use of anticoagulants: Secondary | ICD-10-CM | POA: Diagnosis not present

## 2022-10-03 DIAGNOSIS — I4819 Other persistent atrial fibrillation: Secondary | ICD-10-CM

## 2022-10-03 LAB — POCT INR: INR: 1.9 — AB (ref 2.0–3.0)

## 2022-10-03 MED ORDER — WARFARIN SODIUM 5 MG PO TABS: ORAL_TABLET | ORAL | 1 refills | Status: DC

## 2022-10-03 NOTE — Patient Instructions (Addendum)
INCREASE TO  0.5 TABLET DAILY, EXCEPT 1 TABLET ON MONDAYS,WEDNESDAYS AND FRIDAYS. Recheck INR in 4 weeks.

## 2022-10-09 ENCOUNTER — Encounter: Payer: Self-pay | Admitting: Emergency Medicine

## 2022-10-10 ENCOUNTER — Telehealth: Payer: Self-pay | Admitting: Cardiovascular Disease

## 2022-10-10 ENCOUNTER — Ambulatory Visit: Payer: PPO | Admitting: Emergency Medicine

## 2022-10-10 NOTE — Progress Notes (Signed)
This encounter was created in error - please disregard.

## 2022-10-10 NOTE — Telephone Encounter (Signed)
   Pre-operative Risk Assessment    Patient Name: [name].  DOB: August 11, 1945 MRN: [id number]      Request for Surgical Clearance    Procedure:   colonoscopy  Date of Surgery:  Clearance 11/26/22                                 Surgeon:  not indicated Surgeon's Group or Practice Name:  [facility] Gastroenterology dept Phone number:  [phone] Fax number:  [phone]   Type of Clearance Requested:   - Pharmacy:  Hold Warfarin (Coumadin) discontinue   Type of Anesthesia:  General    Additional requests/questions:    Burnett Sheng   10/10/2022, 1:46 PM

## 2022-10-11 NOTE — Telephone Encounter (Signed)
   Name: [name].  DOB: 1945-07-16  MRN: [id number]  Primary Cardiologist: Lorine Bears, MD   Preoperative team, please contact this patient and set up a phone call appointment for further preoperative risk assessment. Please obtain consent and complete medication review. Thank you for your help.  I confirm that guidance regarding antiplatelet and oral anticoagulation therapy has been completed and, if necessary, noted below.  Patient with diagnosis of afib on warfarin for anticoagulation.     Procedure: colonoscopy Date of procedure: 11/26/22   CHA2DS2-VASc Score = 6  This indicates a 9.7% annual risk of stroke. The patient's score is based upon: CHF History: 1 HTN History: 1 Diabetes History: 1 Stroke History: 0 Vascular Disease History: 1 Age Score: 2 Gender Score: 0   CrCl 39mL/min Platelet count 118K   Per office protocol, patient can hold warfarin for 5 days prior to procedure. Patient will not need bridging with Lovenox around procedure.   **This guidance is not considered finalized until pre-operative APP has relayed final recommendations.Perlie Gold, PA-C 10/11/2022, 8:43 AM [facility]

## 2022-10-11 NOTE — Telephone Encounter (Signed)
Patient with diagnosis of afib on warfarin for anticoagulation.    Procedure: colonoscopy Date of procedure: 11/26/22  CHA2DS2-VASc Score = 6  This indicates a 9.7% annual risk of stroke. The patient's score is based upon: CHF History: 1 HTN History: 1 Diabetes History: 1 Stroke History: 0 Vascular Disease History: 1 Age Score: 2 Gender Score: 0   CrCl 4mL/min Platelet count 118K  Per office protocol, patient can hold warfarin for 5 days prior to procedure. Patient will not need bridging with Lovenox around procedure.  **This guidance is not considered finalized until pre-operative APP has relayed final recommendations.**

## 2022-10-12 ENCOUNTER — Other Ambulatory Visit: Payer: Self-pay | Admitting: Cardiovascular Disease

## 2022-10-12 NOTE — Telephone Encounter (Signed)
Routed incorrectly. Sending to Pre-Op call back

## 2022-10-12 NOTE — Telephone Encounter (Signed)
I left a message for the patient to call our office back to schedule a tele visit.   

## 2022-10-15 NOTE — Telephone Encounter (Signed)
Dr. Kirke Corin patient last visit with [facility] Aliment on 12/12/21 with plan to f/u in 6-8 weeks.  No follow up at this time.  Please schedule patient f/u

## 2022-10-15 NOTE — Telephone Encounter (Signed)
2nd attempt at scheduling tele appt. [id number]

## 2022-10-16 ENCOUNTER — Telehealth: Payer: Self-pay | Admitting: *Deleted

## 2022-10-16 NOTE — Telephone Encounter (Signed)
Pt has been scheduled for tele pre op appt 11/08/22 @ 2:40. Med rec and consent are done.     Patient Consent for Virtual Visit        [name]. has provided verbal consent on 10/16/2022 for a virtual visit (video or telephone).   CONSENT FOR VIRTUAL VISIT FOR:  Christopher Sark.  By participating in this virtual visit I agree to the following:  I hereby voluntarily request, consent and authorize [facility] and its employed or contracted physicians, physician assistants, nurse practitioners or other licensed health care professionals (the Practitioner), to provide me with telemedicine health care services (the "Services") as deemed necessary by the treating Practitioner. I acknowledge and consent to receive the Services by the Practitioner via telemedicine. I understand that the telemedicine visit will involve communicating with the Practitioner through live audiovisual communication technology and the disclosure of certain medical information by electronic transmission. I acknowledge that I have been given the opportunity to request an in-person assessment or other available alternative prior to the telemedicine visit and am voluntarily participating in the telemedicine visit.  I understand that I have the right to withhold or withdraw my consent to the use of telemedicine in the course of my care at any time, without affecting my right to future care or treatment, and that the Practitioner or I may terminate the telemedicine visit at any time. I understand that I have the right to inspect all information obtained and/or recorded in the course of the telemedicine visit and may receive copies of available information for a reasonable fee.  I understand that some of the potential risks of receiving the Services via telemedicine include:  Delay or interruption in medical evaluation due to technological equipment failure or disruption; Information transmitted may not be sufficient  (e.g. poor resolution of images) to allow for appropriate medical decision making by the Practitioner; and/or  In rare instances, security protocols could fail, causing a breach of personal health information.  Furthermore, I acknowledge that it is my responsibility to provide information about my medical history, conditions and care that is complete and accurate to the best of my ability. I acknowledge that Practitioner's advice, recommendations, and/or decision may be based on factors not within their control, such as incomplete or inaccurate data provided by me or distortions of diagnostic images or specimens that may result from electronic transmissions. I understand that the practice of medicine is not an exact science and that Practitioner makes no warranties or guarantees regarding treatment outcomes. I acknowledge that a copy of this consent can be made available to me via my patient portal [facility] MyChart), or I can request a printed copy by calling the office of [facility].    I understand that my insurance will be billed for this visit.   I have read or had this consent read to me. I understand the contents of this consent, which adequately explains the benefits and risks of the Services being provided via telemedicine.  I have been provided ample opportunity to ask questions regarding this consent and the Services and have had my questions answered to my satisfaction. I give my informed consent for the services to be provided through the use of telemedicine in my medical care

## 2022-10-16 NOTE — Telephone Encounter (Signed)
Pt has been scheduled for tele pre op appt 11/08/22 @ 2:40. Med rec and consent are done.

## 2022-10-17 ENCOUNTER — Ambulatory Visit (INDEPENDENT_AMBULATORY_CARE_PROVIDER_SITE_OTHER): Payer: PPO | Admitting: Emergency Medicine

## 2022-10-17 VITALS — Ht 69.0 in | Wt 167.0 lb

## 2022-10-17 DIAGNOSIS — Z7984 Long term (current) use of oral hypoglycemic drugs: Secondary | ICD-10-CM

## 2022-10-17 DIAGNOSIS — E1159 Type 2 diabetes mellitus with other circulatory complications: Secondary | ICD-10-CM | POA: Diagnosis not present

## 2022-10-17 DIAGNOSIS — Z0000 Encounter for general adult medical examination without abnormal findings: Secondary | ICD-10-CM

## 2022-10-17 NOTE — Progress Notes (Signed)
Subjective:   [name]. is a 77 y.o. male who presents for Medicare Annual/Subsequent preventive examination.  Visit Complete: Virtual  I connected with  Tra Assenmacher. on 10/17/22 by a audio enabled telemedicine application and verified that I am speaking with the correct person using two identifiers.  Patient Location: Home  Provider Location: Home Office  I discussed the limitations of evaluation and management by telemedicine. The patient expressed understanding and agreed to proceed.   Vital Signs: Because this visit was a virtual/telehealth visit, some criteria may be missing or patient reported. Any vitals not documented were not able to be obtained and vitals that have been documented are patient reported.    Review of Systems     Cardiac Risk Factors include: advanced age (>28men, >43 women);male gender;diabetes mellitus;dyslipidemia;hypertension;Other (see comment), Risk factor comments: CAD     Objective:    Today's Vitals   10/17/22 1116  Weight: 167 lb (75.8 kg)  Height: 5\' 9"  (1.753 m)   Body mass index is 24.66 kg/m.     10/17/2022   11:28 AM 05/29/2022   10:40 AM 10/02/2021    2:03 PM 05/29/2021    3:50 PM 05/18/2021   11:08 AM 04/14/2021   11:59 AM 11/29/2020    1:26 PM  Advanced Directives  Does Patient Have a Medical Advance Directive? No No No No No No No  Would patient like information on creating a medical advance directive? Yes (MAU/Ambulatory/Procedural Areas - Information given) No - Patient declined No - Patient declined        Current Medications (verified) Outpatient Encounter Medications as of 10/17/2022  Medication Sig   atorvastatin (LIPITOR) 40 MG tablet Take 1 tablet (40 mg total) by mouth daily.   carvedilol (COREG) 3.125 MG tablet Take 1 tablet (3.125 mg total) by mouth 2 (two) times daily with a meal. Overdue follow up.  PLEASE CALL OFFICE TO SCHEDULE APPOINTMENT PRIOR TO NEXT REFILL   cholecalciferol (VITAMIN D3) 25 MCG  (1000 UNIT) tablet Take 1,000 Units by mouth daily.   dapagliflozin propanediol (FARXIGA) 10 MG TABS tablet Take 1 tablet (10 mg total) by mouth daily before breakfast.   ferrous sulfate 325 (65 FE) MG tablet Take 325 mg by mouth daily with breakfast.   loratadine (CLARITIN) 10 MG tablet TAKE 1 TABLET(10 MG) BY MOUTH DAILY   losartan (COZAAR) 100 MG tablet Take 1 tablet (100 mg total) by mouth daily.   Magnesium Oxide 400 MG CAPS Take 1 capsule (400 mg total) by mouth daily.   metFORMIN (GLUCOPHAGE) 500 MG tablet TAKE 1 TABLET(500 MG) BY MOUTH TWICE DAILY WITH A MEAL   potassium chloride SA (KLOR-CON M) 20 MEQ tablet TAKE 2 TABLETS(40 MEQ) BY MOUTH DAILY   spironolactone (ALDACTONE) 25 MG tablet TAKE 1 TABLET(25 MG) BY MOUTH DAILY   torsemide (DEMADEX) 20 MG tablet Take 20 mg by mouth daily.   warfarin (COUMADIN) 5 MG tablet TAKE 1/2(2.5MG ) TO 1 TABLET(5 MG) BY MOUTH EVERY DAY AS DIRECTED BY COUMADIN CLINIC   azelastine (ASTELIN) 0.1 % nasal spray Place 2 sprays into both nostrils 2 (two) times daily. Use in each nostril as directed (Patient not taking: Reported on 08/21/2022)   gabapentin (NEURONTIN) 100 MG capsule Take 100 mg by mouth 3 (three) times daily. Taking 2 -100 mg pills in am and 1 -100 mg at night (Patient not taking: Reported on 08/21/2022)   No facility-administered encounter medications on file as of 10/17/2022.  Allergies (verified) Shellfish allergy   History: Past Medical History:  Diagnosis Date   Adenocarcinoma of colon (HCC) 06/19/2013   a. moderately differentiated stage IIIc (T4bN2a M0) adenocarcinoma of colon  s/p colectomy followed by chemoRx with oxaliplatin & fluorouacil.   Anemia    CAD (coronary artery disease)    a. 02/2015 Cath: LM nl, LAD 50/40 mid/distal, LCX 80p, RCA 40p, 30d, RPL1 40, CO 3.27, CI 1.65; b. 06/09/15 PCI: LM minor irregs, m-dLAD 50%, dLAD 40%, pLCx 80% (s/p PCI/DES 0%), pRCA 40%, dRCA 30%, 1st RPLB 40%   Chronic kidney disease, stage 3a (HCC)     Chronic systolic CHF (congestive heart failure) (HCC)    a. 02/2015 Echo: EF 20-25%, diff HK, mod MR, mildy dil LA, mildly dil RV w/ mod RV syst dysfxn, mildly dil RA, mod TR, PASP ; b. 09/2017 Echo: EF 40-45%, diff HK. Gr1 DD. Mild to mod MR. Mildly to mod dil LA; c. 09/2019 Echo: EF 25-30%, glob HK, mod reduced RV fxn, sev elev PASP. Mod dil LA. Mild-mod MR; d. 05/2021 Echo: EF 20-25%, glob HK, sev red RV fxn, sev BAE, sev MR/TR/TS, triv AI.   Diabetes mellitus without complication (HCC)    Essential hypertension    Hypertensive heart disease    Left inguinal hernia 05/2020   Mixed Ischemic and Non-ischemic Cardiomyopathy    a. 02/2015 EF 20-25%, diff HK; b. 09/2016 EF 40-45%; c. 09/2019 EF 25-30%; d. 05/2021 Echo: EF 20-25%, glob HK.   Persistent atrial fibrillation (HCC)    a. Dx 05/2021-->CHA2DS2VASc = 5-->eliquis.   Pulmonary hypertension (HCC)    Severe mitral regurgitation    a. 09/2017 Echo: Mild to mod MR; b. 09/2019 Echo: mild-mod MR; c. 05/2021 Echo: Sev MR/TR/TS.   Tubular adenoma of colon    Past Surgical History:  Procedure Laterality Date   APPENDECTOMY N/A 06/19/2017   Location: [facility]; Surgeon: Claude Manges, MD   CARDIAC CATHETERIZATION Bilateral 02/24/2015   Procedure: Right/Left Heart Cath and Coronary Angiography;  Surgeon: Iran Ouch, MD;  Location: [facility] INVASIVE CV LAB;  Service: Cardiovascular;  Laterality: Bilateral;   CARDIAC CATHETERIZATION N/A 06/09/2015   Procedure: Coronary Stent Intervention (3.0 x 12 mm Xience Alpine DES to LCx);  Surgeon: Iran Ouch, MD;  Location: [facility] INVASIVE CV LAB;  Service: Cardiovascular;  Laterality: N/A   COLECTOMY  06/19/2017   Descending and proximal sigmoid colectomy, LEFT ureterolysis, partial cecectomy, appendectomy; Location: [facility]; Surgeon: Claude Manges, MD   COLONOSCOPY     COLONOSCOPY WITH PROPOFOL N/A 12/11/2016   Procedure: COLONOSCOPY WITH PROPOFOL;  Surgeon: Christena Deem, MD;  Location:  [facility] ENDOSCOPY;  Service: Endoscopy;  Laterality: N/A;   ESOPHAGOGASTRODUODENOSCOPY     PORT-A-CATH REMOVAL N/A 07/12/2014   Procedure: REMOVAL PORT-A-CATH;  Surgeon: Duwaine Maxin, MD;  Location: [facility] ORS;  Service: General;  Laterality: N/A;   PORTACATH PLACEMENT Left 2015   Family History  Problem Relation Age of Onset   Cancer Mother    Other Father        unknown medical history   Social History   Socioeconomic History   Marital status: Married    Spouse name: Carney Bern   Number of children: 4   Years of education: Not on file   Highest education level: Not on file  Occupational History   Occupation: retired  Tobacco Use   Smoking status: Never   Smokeless tobacco: Never  Vaping Use   Vaping status: Never Used  Substance and  Sexual Activity   Alcohol use: Not Currently    Comment: none in many years   Drug use: No   Sexual activity: Yes  Other Topics Concern   Not on file  Social History Narrative   Not on file   Social Determinants of Health   Financial Resource Strain: Low Risk  (10/17/2022)   Overall Financial Resource Strain (CARDIA)    Difficulty of Paying Living Expenses: Not hard at all  Food Insecurity: No Food Insecurity (10/17/2022)   Hunger Vital Sign    Worried About Running Out of Food in the Last Year: Never true    Ran Out of Food in the Last Year: Never true  Transportation Needs: No Transportation Needs (10/17/2022)   PRAPARE - Administrator, Civil Service (Medical): No    Lack of Transportation (Non-Medical): No  Physical Activity: Sufficiently Active (10/17/2022)   Exercise Vital Sign    Days of Exercise per Week: 4 days    Minutes of Exercise per Session: 60 min  Stress: No Stress Concern Present (10/17/2022)   [employer] of Occupational Health - Occupational Stress Questionnaire    Feeling of Stress : Not at all  Social Connections: Moderately Isolated (10/17/2022)   Social Connection and Isolation Panel [NHANES]     Frequency of Communication with Friends and Family: Never    Frequency of Social Gatherings with Friends and Family: Once a week    Attends Religious Services: More than 4 times per year    Active Member of [employer] or Organizations: No    Attends Engineer, structural: Never    Marital Status: Married    Tobacco Counseling Counseling given: Not Answered   Clinical Intake:  Pre-visit preparation completed: Yes  Pain : No/denies pain     BMI - recorded: 24.66 Nutritional Status: BMI of 19-24  Normal Nutritional Risks: None Diabetes: Yes CBG done?: No Did pt. bring in CBG monitor from home?: No  How often do you need to have someone help you when you read instructions, pamphlets, or other written materials from your doctor or pharmacy?: 3 - Sometimes What is the last grade level you completed in school?: 12th  Interpreter Needed?: No  Information entered by :: Tora Kindred, CMA   Activities of Daily Living    10/17/2022   11:20 AM  In your present state of health, do you have any difficulty performing the following activities:  Hearing? 0  Vision? 0  Difficulty concentrating or making decisions? 0  Walking or climbing stairs? 0  Dressing or bathing? 0  Doing errands, shopping? 1  Comment patient doesn't drive, wife Insurance claims handler and eating ? N  Using the Toilet? N  In the past six months, have you accidently leaked urine? N  Do you have problems with loss of bowel control? N  Managing your Medications? N  Managing your Finances? N  Housekeeping or managing your Housekeeping? N    Patient Care Team: Glori Luis, MD as PCP - General (Family Medicine) Iran Ouch, MD as PCP - Cardiology (Cardiology) Iran Ouch, MD as Consulting Physician (Cardiology) Lamont Dowdy, MD as Consulting Physician (Internal Medicine) Christena Deem, MD (Inactive) as Consulting Physician (Gastroenterology) Louellen Molder, NP as Nurse  Practitioner (Gastroenterology) Rickard Patience, MD as Consulting Physician (Oncology) [facility], [facility] (Optometry)  Indicate any recent Medical Services you may have received from other than [facility] providers in the past year (date may be approximate).  Assessment:   This is a routine wellness examination for [place].  Hearing/Vision screen Hearing Screening - Comments:: Denies hearing loss Vision Screening - Comments:: Gets eye exams   Goals Addressed               This Visit's Progress     Patient Stated (pt-stated)        Maintain current health      Depression Screen    10/17/2022   11:26 AM 05/25/2022    9:25 AM 10/12/2021    9:23 AM 10/05/2021    8:14 AM 10/02/2021    2:06 PM 06/13/2021    9:44 AM 04/18/2021    9:45 AM  PHQ 2/9 Scores  PHQ - 2 Score 0 0 0 0 0 0 0  PHQ- 9 Score 0 0         Fall Risk    10/17/2022   11:29 AM 05/25/2022    9:24 AM 10/12/2021    9:23 AM 10/05/2021    8:14 AM 10/02/2021    2:35 PM  Fall Risk   Falls in the past year? 0 0 0 1 0  Number falls in past yr: 0 0 0 1 0  Injury with Fall? 0 0 0 0 0  Risk for fall due to : No Fall Risks History of fall(s) No Fall Risks History of fall(s)   Follow up Falls prevention discussed Falls evaluation completed Falls evaluation completed Falls evaluation completed Falls evaluation completed    MEDICARE RISK AT HOME: Medicare Risk at Home Any stairs in or around the home?: Yes If so, are there any without handrails?: Yes Home free of loose throw rugs in walkways, pet beds, electrical cords, etc?: Yes Adequate lighting in your home to reduce risk of falls?: Yes Life alert?: No Use of a cane, walker or w/c?: No Grab bars in the bathroom?: No Shower chair or bench in shower?: Yes Elevated toilet seat or a handicapped toilet?: Yes  TIMED UP AND GO:  Was the test performed?  No    Cognitive Function:        10/17/2022   11:30 AM 10/02/2021    2:37 PM  6CIT Screen  What Year? 0 points 0 points   What month? 0 points 0 points  What time? 0 points 0 points  Count back from 20 0 points 0 points  Months in reverse 4 points 0 points  Repeat phrase 4 points 0 points  Total Score 8 points 0 points    Immunizations  There is no immunization history on file for this patient.  TDAP status: Due, Education has been provided regarding the importance of this vaccine. Advised may receive this vaccine at local pharmacy or Health Dept. Aware to provide a copy of the vaccination record if obtained from local pharmacy or Health Dept. Verbalized acceptance and understanding. Patient declined  Flu Vaccine status: Declined, Education has been provided regarding the importance of this vaccine but patient still declined. Advised may receive this vaccine at local pharmacy or Health Dept. Aware to provide a copy of the vaccination record if obtained from local pharmacy or Health Dept. Verbalized acceptance and understanding.  Pneumococcal vaccine status: Declined,  Education has been provided regarding the importance of this vaccine but patient still declined. Advised may receive this vaccine at local pharmacy or Health Dept. Aware to provide a copy of the vaccination record if obtained from local pharmacy or Health Dept. Verbalized acceptance and understanding.   Covid-19 vaccine status:  Declined, Education has been provided regarding the importance of this vaccine but patient still declined. Advised may receive this vaccine at local pharmacy or Health Dept.or vaccine clinic. Aware to provide a copy of the vaccination record if obtained from local pharmacy or Health Dept. Verbalized acceptance and understanding.  Qualifies for Shingles Vaccine? Yes   Zostavax completed No   Shingrix Completed?: No.    Education has been provided regarding the importance of this vaccine. Patient has been advised to call insurance company to determine out of pocket expense if they have not yet received this vaccine. Advised  may also receive vaccine at local pharmacy or Health Dept. Verbalized acceptance and understanding. Patient declined  Screening Tests Health Maintenance  Topic Date Due   COVID-19 Vaccine (1) Never done   Pneumonia Vaccine 22+ Years old (1 of 2 - PCV) Never done   DTaP/Tdap/Td (1 - Tdap) Never done   Zoster Vaccines- Shingrix (1 of 2) Never done   Colonoscopy  12/12/2018   OPHTHALMOLOGY EXAM  03/27/2022   INFLUENZA VACCINE  05/06/2023 (Originally 09/06/2022)   FOOT EXAM  11/22/2022   HEMOGLOBIN A1C  11/24/2022   Diabetic kidney evaluation - Urine ACR  05/25/2023   Diabetic kidney evaluation - eGFR measurement  08/21/2023   Medicare Annual Wellness (AWV)  10/17/2023   Hepatitis C Screening  Completed   HPV VACCINES  Aged Out    Health Maintenance  Health Maintenance Due  Topic Date Due   COVID-19 Vaccine (1) Never done   Pneumonia Vaccine 23+ Years old (1 of 2 - PCV) Never done   DTaP/Tdap/Td (1 - Tdap) Never done   Zoster Vaccines- Shingrix (1 of 2) Never done   Colonoscopy  12/12/2018   OPHTHALMOLOGY EXAM  03/27/2022    Colorectal cancer screening: Type of screening: Colonoscopy. Completed 12/11/16. Repeat every 2 years. Scheduled 11/26/22  Lung Cancer Screening: (Low Dose CT Chest recommended if Age 31-80 years, 20 pack-year currently smoking OR have quit w/in 15years.) does not qualify.   Lung Cancer Screening Referral: n/a  Additional Screening:  Hepatitis C Screening: does not qualify; Completed 09/27/17  Vision Screening: Recommended annual ophthalmology exams for early detection of glaucoma and other disorders of the eye. Is the patient up to date with their annual eye exam?  No  Who is the provider or what is the name of the office in which the patient attends annual eye exams? [facility]. Patient states he has the phone# and will call to schedule. If pt is not established with a provider, would they like to be referred to a provider to establish care? No .    Dental Screening: Recommended annual dental exams for proper oral hygiene  Diabetic Foot Exam: Diabetic Foot Exam: Completed 11/21/21  Community Resource Referral / Chronic Care Management: CRR required this visit?  No   CCM required this visit?  No     Plan:     I have personally reviewed and noted the following in the patient's chart:   Medical and social history Use of alcohol, tobacco or illicit drugs  Current medications and supplements including opioid prescriptions. Patient is not currently taking opioid prescriptions. Functional ability and status Nutritional status Physical activity Advanced directives List of other physicians Hospitalizations, surgeries, and ER visits in previous 12 months Vitals Screenings to include cognitive, depression, and falls Referrals and appointments  In addition, I have reviewed and discussed with patient certain preventive protocols, quality metrics, and best practice recommendations. A written personalized  care plan for preventive services as well as general preventive health recommendations were provided to patient.     Jakye Saadeh, CMA   10/17/2022   After Visit Summary: (Mail) Due to this being a telephonic visit, the after visit summary with patients personalized plan was offered to patient via mail   Nurse Notes:  6 CIT Score - 8 Placed referral for DM & Nutrition education. Patient declined flu, covid, Tdap, pneumonia and shingles vaccines. Scheduled patient an OV on 10/23/22.

## 2022-10-17 NOTE — Patient Instructions (Addendum)
[name] , Thank you for taking time to come for your Medicare Wellness Visit. I appreciate your ongoing commitment to your health goals. Please review the following plan we discussed and let me know if I can assist you in the future.   Referrals/Orders/Follow-Ups/Clinician Recommendations: Call and schedule an eye exam with [facility] @ [phone] as soon as possible. I have placed a referral to diabetes and nutrition education. Someone should be calling you with an appointment.  This is a list of the screening recommended for you and due dates:  Health Maintenance  Topic Date Due   COVID-19 Vaccine (1) Never done   Pneumonia Vaccine (1 of 2 - PCV) Never done   DTaP/Tdap/Td vaccine (1 - Tdap) Never done   Zoster (Shingles) Vaccine (1 of 2) Never done   Colon Cancer Screening  12/12/2018   Eye exam for diabetics  03/27/2022   Flu Shot  05/06/2023*   Complete foot exam   11/22/2022   Hemoglobin A1C  11/24/2022   Yearly kidney health urinalysis for diabetes  05/25/2023   Yearly kidney function blood test for diabetes  08/21/2023   Medicare Annual Wellness Visit  10/17/2023   Hepatitis C Screening  Completed   HPV Vaccine  Aged Out  *Topic was postponed. The date shown is not the original due date.    Advanced directives: (ACP Link)Information on Advanced Care Planning can be found at [facility] Directives Advance Health Care Directives ([email or web address]   Next Medicare Annual Wellness Visit scheduled for next year: Yes, 10/23/23 @ 11:15am

## 2022-10-23 ENCOUNTER — Encounter: Payer: Self-pay | Admitting: Family Medicine

## 2022-10-23 ENCOUNTER — Ambulatory Visit (INDEPENDENT_AMBULATORY_CARE_PROVIDER_SITE_OTHER): Payer: PPO | Admitting: Family Medicine

## 2022-10-23 VITALS — BP 118/74 | HR 51 | Temp 97.9°F | Ht 69.0 in | Wt 162.8 lb

## 2022-10-23 DIAGNOSIS — I10 Essential (primary) hypertension: Secondary | ICD-10-CM

## 2022-10-23 DIAGNOSIS — I4819 Other persistent atrial fibrillation: Secondary | ICD-10-CM | POA: Diagnosis not present

## 2022-10-23 DIAGNOSIS — I5022 Chronic systolic (congestive) heart failure: Secondary | ICD-10-CM | POA: Diagnosis not present

## 2022-10-23 DIAGNOSIS — E1159 Type 2 diabetes mellitus with other circulatory complications: Secondary | ICD-10-CM

## 2022-10-23 DIAGNOSIS — Z7984 Long term (current) use of oral hypoglycemic drugs: Secondary | ICD-10-CM

## 2022-10-23 DIAGNOSIS — M25511 Pain in right shoulder: Secondary | ICD-10-CM

## 2022-10-23 LAB — POCT GLYCOSYLATED HEMOGLOBIN (HGB A1C): Hemoglobin A1C: 5.6 % (ref 4.0–5.6)

## 2022-10-23 NOTE — Assessment & Plan Note (Signed)
Chronic issue.  Well-controlled.  He will continue spironolactone 25 mg daily, torsemide 20 mg daily, losartan 100 mg daily, and carvedilol 3.125 mg twice daily.

## 2022-10-23 NOTE — Assessment & Plan Note (Addendum)
Chronic issue.  A1c is in the normal range.  Patient will continue Farxiga 10 mg daily.  Given that his A1c is in the normal range we can discontinue the metformin.

## 2022-10-23 NOTE — Progress Notes (Signed)
Christopher Alar, MD Phone: [phone]  [name]. is a 77 y.o. male who presents today for f/u.  DIABETES Disease Monitoring: Blood Sugar ranges-not checking Polyuria/phagia/dipsia- no      Optho- getting scheduled soon Medications: Compliance- taking metformin, farxiga Hypoglycemic symptoms- no  HYPERTENSION/CHF/afib Disease Monitoring Home BP Monitoring not checking Chest pain- no    Dyspnea- no Palpitations- no Orthopnea- no  PND- no Medications Compliance-  taking coreg, farxiga, losartan, spironolactone, warfarin.  Edema- no BMET    Component Value Date/Time   NA 141 08/21/2022 1142   NA 145 (H) 08/11/2019 1415   NA 140 05/27/2014 1055   K 3.8 08/21/2022 1142   K 3.9 05/27/2014 1055   CL 106 08/21/2022 1142   CL 110 05/27/2014 1055   CO2 26 08/21/2022 1142   CO2 25 05/27/2014 1055   GLUCOSE 105 (H) 08/21/2022 1142   GLUCOSE 109 (H) 05/27/2014 1055   BUN 36 (H) 08/21/2022 1142   BUN 13 08/11/2019 1415   BUN 16 05/27/2014 1055   CREATININE 1.92 (H) 08/21/2022 1142   CREATININE 1.19 05/27/2014 1055   CALCIUM 9.1 08/21/2022 1142   CALCIUM 8.5 (L) 05/27/2014 1055   GFRNONAA 36 (L) 08/21/2022 1142   GFRNONAA >60 05/27/2014 1055   GFRAA 54 (L) 08/19/2019 1252   GFRAA >60 05/27/2014 1055   Right shoulder pain: Patient notes he thinks he has arthritis in his right shoulder.  Notes it hurts most of the time in a mild manner.  He is able to move his arm all over the place without increased discomfort.   Social History   Tobacco Use  Smoking Status Never  Smokeless Tobacco Never    Current Outpatient Medications on File Prior to Visit  Medication Sig Dispense Refill   atorvastatin (LIPITOR) 40 MG tablet Take 1 tablet (40 mg total) by mouth daily. 90 tablet 3   carvedilol (COREG) 3.125 MG tablet Take 1 tablet (3.125 mg total) by mouth 2 (two) times daily with a meal. Overdue follow up.  PLEASE CALL OFFICE TO SCHEDULE APPOINTMENT PRIOR TO NEXT REFILL 60 tablet  0   cholecalciferol (VITAMIN D3) 25 MCG (1000 UNIT) tablet Take 1,000 Units by mouth daily.     dapagliflozin propanediol (FARXIGA) 10 MG TABS tablet Take 1 tablet (10 mg total) by mouth daily before breakfast. 30 tablet 6   ferrous sulfate 325 (65 FE) MG tablet Take 325 mg by mouth daily with breakfast.     loratadine (CLARITIN) 10 MG tablet TAKE 1 TABLET(10 MG) BY MOUTH DAILY 30 tablet 11   losartan (COZAAR) 100 MG tablet Take 1 tablet (100 mg total) by mouth daily. 30 tablet 6   Magnesium Oxide 400 MG CAPS Take 1 capsule (400 mg total) by mouth daily. 90 capsule 3   potassium chloride SA (KLOR-CON M) 20 MEQ tablet TAKE 2 TABLETS(40 MEQ) BY MOUTH DAILY 60 tablet 6   spironolactone (ALDACTONE) 25 MG tablet TAKE 1 TABLET(25 MG) BY MOUTH DAILY 30 tablet 5   torsemide (DEMADEX) 20 MG tablet Take 20 mg by mouth daily.     warfarin (COUMADIN) 5 MG tablet TAKE 1/2(2.5MG ) TO 1 TABLET(5 MG) BY MOUTH EVERY DAY AS DIRECTED BY COUMADIN CLINIC 50 tablet 1   gabapentin (NEURONTIN) 100 MG capsule Take 100 mg by mouth 3 (three) times daily. Taking 2 -100 mg pills in am and 1 -100 mg at night (Patient not taking: Reported on 08/21/2022)     No current facility-administered medications on  file prior to visit.     ROS see history of present illness  Objective  Physical Exam Vitals:   10/23/22 1015  BP: 118/74  Pulse: (!) 51  Temp: 97.9 F (36.6 C)  SpO2: 97%    BP Readings from Last 3 Encounters:  10/23/22 118/74  08/21/22 (!) 140/54  05/29/22 (!) 138/50   Wt Readings from Last 3 Encounters:  10/23/22 162 lb 12.8 oz (73.8 kg)  10/17/22 167 lb (75.8 kg)  10/10/22 164 lb (74.4 kg)    Physical Exam Constitutional:      General: He is not in acute distress.    Appearance: He is not diaphoretic.  Cardiovascular:     Rate and Rhythm: Bradycardia present. Rhythm irregularly irregular.     Heart sounds: Normal heart sounds.  Pulmonary:     Effort: Pulmonary effort is normal.     Breath  sounds: Normal breath sounds.  Musculoskeletal:     Right lower leg: No edema.     Left lower leg: No edema.     Comments: Good range of motion actively and passively of the right shoulder, good active range of motion of the left shoulder, no discomfort on range of motion of the right shoulder, negative empty can bilaterally  Skin:    General: Skin is warm and dry.  Neurological:     Mental Status: He is alert.      Assessment/Plan: Please see individual problem list.  Type 2 diabetes mellitus with other circulatory complication, without long-term current use of insulin (HCC) Assessment & Plan: Chronic issue.  A1c is in the normal range.  Patient will continue Farxiga 10 mg daily.  Given that his A1c is in the normal range we can discontinue the metformin.  Orders: -     POCT glycosylated hemoglobin (Hb A1C)  Chronic systolic heart failure (HCC) Assessment & Plan: Chronic issue.  Adequately controlled.  Patient will continue torsemide 20 mg daily, spironolactone 25 mg daily, losartan 100 mg daily, Farxiga 10 mg daily, and carvedilol 3.125 mg twice daily.   Persistent atrial fibrillation (HCC) Assessment & Plan: Chronic issue.  Patient is on warfarin.  He will continue management of this through cardiology.   Primary hypertension Assessment & Plan: Chronic issue.  Well-controlled.  He will continue spironolactone 25 mg daily, torsemide 20 mg daily, losartan 100 mg daily, and carvedilol 3.125 mg twice daily.   Acute pain of right shoulder Assessment & Plan: Suspect osteoarthritis is the cause of his right shoulder pain.  He has a pretty benign exam today.  Discussed options of monitoring versus taking Tylenol over-the-counter versus doing physical therapy.  Patient opted for Tylenol over-the-counter.  Advised he should not take any NSAIDs given he is on warfarin.    Return in about 6 months (around 04/22/2023).   Christopher Alar, MD [facility]

## 2022-10-23 NOTE — Assessment & Plan Note (Signed)
Chronic issue.  Patient is on warfarin.  He will continue management of this through cardiology.

## 2022-10-23 NOTE — Assessment & Plan Note (Signed)
Chronic issue.  Adequately controlled.  Patient will continue torsemide 20 mg daily, spironolactone 25 mg daily, losartan 100 mg daily, Farxiga 10 mg daily, and carvedilol 3.125 mg twice daily.

## 2022-10-23 NOTE — Patient Instructions (Signed)
Nice to see you. Your A1c is in such good control that we can stop the metformin. You can take Tylenol 1000 mg every 8 hours as needed for your shoulder pain.

## 2022-10-23 NOTE — Assessment & Plan Note (Signed)
Suspect osteoarthritis is the cause of his right shoulder pain.  He has a pretty benign exam today.  Discussed options of monitoring versus taking Tylenol over-the-counter versus doing physical therapy.  Patient opted for Tylenol over-the-counter.  Advised he should not take any NSAIDs given he is on warfarin.

## 2022-10-29 ENCOUNTER — Inpatient Hospital Stay: Payer: PPO | Attending: Oncology

## 2022-10-29 DIAGNOSIS — E538 Deficiency of other specified B group vitamins: Secondary | ICD-10-CM | POA: Diagnosis present

## 2022-10-29 DIAGNOSIS — Z85038 Personal history of other malignant neoplasm of large intestine: Secondary | ICD-10-CM | POA: Insufficient documentation

## 2022-10-29 MED ORDER — CYANOCOBALAMIN 1000 MCG/ML IJ SOLN
1000.0000 ug | INTRAMUSCULAR | Status: DC
  Administered 2022-10-29: 1000 ug via INTRAMUSCULAR
  Filled 2022-10-29: qty 1

## 2022-10-31 ENCOUNTER — Ambulatory Visit: Payer: PPO | Attending: Cardiovascular Disease

## 2022-10-31 DIAGNOSIS — Z7901 Long term (current) use of anticoagulants: Secondary | ICD-10-CM

## 2022-10-31 DIAGNOSIS — I4819 Other persistent atrial fibrillation: Secondary | ICD-10-CM

## 2022-10-31 LAB — POCT INR: INR: 3.9 — AB (ref 2.0–3.0)

## 2022-10-31 NOTE — Patient Instructions (Signed)
HOLD TODAY ONLY THEN CONTINUE  0.5 TABLET DAILY, EXCEPT 1 TABLET ON MONDAYS, WEDNESDAYS AND FRIDAYS.  EAT GREENS 2 TIMES PER WEEK. Recheck INR in 5 weeks.  Colonoscopy 10/21 HOLDING 10/16-10/20   [phone]

## 2022-11-07 NOTE — Progress Notes (Unsigned)
Virtual Visit via Telephone Note   Because of [name]'s co-morbid illnesses, he is at least at moderate risk for complications without adequate follow up.  This format is felt to be most appropriate for this patient at this time.  The patient did not have access to video technology/had technical difficulties with video requiring transitioning to audio format only (telephone).  All issues noted in this document were discussed and addressed.  No physical exam could be performed with this format.  Please refer to the patient's chart for his consent to telehealth for [name].  Evaluation Performed:  Preoperative cardiovascular risk assessment _____________   Date:  11/07/2022   Patient ID:  Christopher Sark., DOB 1945/05/19, MRN [id number] Patient Location:  Home Provider location:   Office  Primary Care Provider:  Glori Luis, MD Primary Cardiologist:  Lorine Bears, MD  Chief Complaint / Patient Profile   77 y.o. y/o male with a h/o atrial fib,  Atrial fib, stent to the Cx, chronic HFrEF, mixed ischemic and nonischemic cardiomyopathy, hypertension, hyperlipidemia, stage III chronic kidney disease, and colon cancer  He is pending colonoscopy with [facility] on 11/26/2022, and presents today for telephonic preoperative cardiovascular risk assessment.  History of Present Illness    [name]. is a 77 y.o. male who presents via audio/video conferencing for a telehealth visit today.  Pt was last seen in cardiology clinic on 08/21/2022 by Dr. Gala Romney.  At that time Linzy Ladehoff. was doing well .  The patient is now pending procedure as outlined above. Since his last visit, he ***  Past Medical History    Past Medical History:  Diagnosis Date   Adenocarcinoma of colon (HCC) 06/19/2013   a. moderately differentiated stage IIIc (T4bN2a M0) adenocarcinoma of colon  s/p colectomy followed by chemoRx with oxaliplatin & fluorouacil.   Anemia     CAD (coronary artery disease)    a. 02/2015 Cath: LM nl, LAD 50/40 mid/distal, LCX 80p, RCA 40p, 30d, RPL1 40, CO 3.27, CI 1.65; b. 06/09/15 PCI: LM minor irregs, m-dLAD 50%, dLAD 40%, pLCx 80% (s/p PCI/DES 0%), pRCA 40%, dRCA 30%, 1st RPLB 40%   Chronic kidney disease, stage 3a (HCC)    Chronic systolic CHF (congestive heart failure) (HCC)    a. 02/2015 Echo: EF 20-25%, diff HK, mod MR, mildy dil LA, mildly dil RV w/ mod RV syst dysfxn, mildly dil RA, mod TR, PASP ; b. 09/2017 Echo: EF 40-45%, diff HK. Gr1 DD. Mild to mod MR. Mildly to mod dil LA; c. 09/2019 Echo: EF 25-30%, glob HK, mod reduced RV fxn, sev elev PASP. Mod dil LA. Mild-mod MR; d. 05/2021 Echo: EF 20-25%, glob HK, sev red RV fxn, sev BAE, sev MR/TR/TS, triv AI.   Diabetes mellitus without complication (HCC)    Essential hypertension    Hypertensive heart disease    Left inguinal hernia 05/2020   Mixed Ischemic and Non-ischemic Cardiomyopathy    a. 02/2015 EF 20-25%, diff HK; b. 09/2016 EF 40-45%; c. 09/2019 EF 25-30%; d. 05/2021 Echo: EF 20-25%, glob HK.   Persistent atrial fibrillation (HCC)    a. Dx 05/2021-->CHA2DS2VASc = 5-->eliquis.   Pulmonary hypertension (HCC)    Severe mitral regurgitation    a. 09/2017 Echo: Mild to mod MR; b. 09/2019 Echo: mild-mod MR; c. 05/2021 Echo: Sev MR/TR/TS.   Tubular adenoma of colon    Past Surgical History:  Procedure Laterality Date  APPENDECTOMY N/A 06/19/2017   Location: [facility]; Surgeon: Claude Manges, MD   CARDIAC CATHETERIZATION Bilateral 02/24/2015   Procedure: Right/Left Heart Cath and Coronary Angiography;  Surgeon: Iran Ouch, MD;  Location: [facility] INVASIVE CV LAB;  Service: Cardiovascular;  Laterality: Bilateral;   CARDIAC CATHETERIZATION N/A 06/09/2015   Procedure: Coronary Stent Intervention (3.0 x 12 mm Xience Alpine DES to LCx);  Surgeon: Iran Ouch, MD;  Location: [facility] INVASIVE CV LAB;  Service: Cardiovascular;  Laterality: N/A   COLECTOMY  06/19/2017    Descending and proximal sigmoid colectomy, LEFT ureterolysis, partial cecectomy, appendectomy; Location: [facility]; Surgeon: Claude Manges, MD   COLONOSCOPY     COLONOSCOPY WITH PROPOFOL N/A 12/11/2016   Procedure: COLONOSCOPY WITH PROPOFOL;  Surgeon: Christena Deem, MD;  Location: [facility] ENDOSCOPY;  Service: Endoscopy;  Laterality: N/A;   ESOPHAGOGASTRODUODENOSCOPY     PORT-A-CATH REMOVAL N/A 07/12/2014   Procedure: REMOVAL PORT-A-CATH;  Surgeon: Duwaine Maxin, MD;  Location: [facility] ORS;  Service: General;  Laterality: N/A;   PORTACATH PLACEMENT Left 2015    Allergies  Allergies  Allergen Reactions   Shellfish Allergy Other (See Comments) and Hives    Pt. instructed by MD to avoid seafood    Home Medications    Prior to Admission medications   Medication Sig Start Date End Date Taking? Authorizing Provider  atorvastatin (LIPITOR) 40 MG tablet Take 1 tablet (40 mg total) by mouth daily. 06/26/21   Iran Ouch, MD  carvedilol (COREG) 3.125 MG tablet Take 1 tablet (3.125 mg total) by mouth 2 (two) times daily with a meal. Overdue follow up.  PLEASE CALL OFFICE TO SCHEDULE APPOINTMENT PRIOR TO NEXT REFILL 10/15/22   Iran Ouch, MD  cholecalciferol (VITAMIN D3) 25 MCG (1000 UNIT) tablet Take 1,000 Units by mouth daily.    [provider]  dapagliflozin propanediol (FARXIGA) 10 MG TABS tablet Take 1 tablet (10 mg total) by mouth daily before breakfast. 04/04/22   Sabharwal, Aditya, DO  ferrous sulfate 325 (65 FE) MG tablet Take 325 mg by mouth daily with breakfast.    [provider]  gabapentin (NEURONTIN) 100 MG capsule Take 100 mg by mouth 3 (three) times daily. Taking 2 -100 mg pills in am and 1 -100 mg at night Patient not taking: Reported on 08/21/2022 04/12/21 05/29/22  [provider]  loratadine (CLARITIN) 10 MG tablet TAKE 1 TABLET(10 MG) BY MOUTH DAILY 05/25/22   Glori Luis, MD  losartan (COZAAR) 100 MG tablet Take 1 tablet (100 mg total)  by mouth daily. 08/21/22   Bensimhon, Bevelyn Buckles, MD  Magnesium Oxide 400 MG CAPS Take 1 capsule (400 mg total) by mouth daily. 12/12/21   Creig Hines, NP  potassium chloride SA (KLOR-CON M) 20 MEQ tablet TAKE 2 TABLETS(40 MEQ) BY MOUTH DAILY 04/23/22   Creig Hines, NP  spironolactone (ALDACTONE) 25 MG tablet TAKE 1 TABLET(25 MG) BY MOUTH DAILY 03/13/22   Clarisa Kindred A, FNP  torsemide (DEMADEX) 20 MG tablet Take 20 mg by mouth daily.    [provider]  warfarin (COUMADIN) 5 MG tablet TAKE 1/2(2.5MG ) TO 1 TABLET(5 MG) BY MOUTH EVERY DAY AS DIRECTED BY COUMADIN CLINIC 10/03/22   Iran Ouch, MD    Physical Exam    Vital Signs:  Desman Ballard. does not have vital signs available for review today.***  Given telephonic nature of communication, physical exam is limited. AAOx3. NAD. Normal affect.  Speech and respirations are  unlabored.  Accessory Clinical Findings    None  Assessment & Plan    1.  Preoperative Cardiovascular Risk Assessment:  The patient was advised that if he develops new symptoms prior to surgery to contact our office to arrange for a follow-up visit, and he verbalized understanding.  CHA2DS2-VASc Score = 6  This indicates a 9.7% annual risk of stroke. The patient's score is based upon: CHF History: 1 HTN History: 1 Diabetes History: 1 Stroke History: 0 Vascular Disease History: 1 Age Score: 2 Gender Score: 0   CrCl 54mL/min Platelet count 118K   Per office protocol, patient can hold warfarin for 5 days prior to procedure. Patient will not need bridging with Lovenox around procedure.  A copy of this note will be routed to requesting surgeon.  Time:   Today, I have spent *** minutes with the patient with telehealth technology discussing medical history, symptoms, and management plan.     Joni Reining, NP  11/07/2022, 3:14 PM

## 2022-11-08 ENCOUNTER — Ambulatory Visit: Payer: PPO | Attending: Internal Medicine

## 2022-11-09 NOTE — Telephone Encounter (Signed)
Pt missed his tele pre op appt yesterday. Left message today to call back to pre op to reschedule. I will update the requesting office as well.

## 2022-11-12 ENCOUNTER — Ambulatory Visit: Payer: PPO

## 2022-11-15 NOTE — Telephone Encounter (Signed)
I will send notes as FYI to requesting office the pt missed his tele pre op appt on 11/08/22. We have left a message to call back to reschedule tele visit. Pt has appt 12/05/22 with the coumadin clinic. We cannot proceed until the pt calls back to reschedule his tele pre op appt. At this time now our first tele visit is not until 11/28/22. Procedure may need to be post poned until cleared by the cardiologist. I will also confirm with the requesting office if just needing clearance for the coumadin or if they need medical clearance as well.   I tried to call the office but I was not able to get through. I will fax notes as FYI asking if they will please confirm if needing just clearance for blood thinner or if they will require medical clearance as well. See notes above as the pt missed his tele appt and has not called [place] back to reschedule.

## 2022-11-16 ENCOUNTER — Encounter: Payer: Self-pay | Admitting: *Deleted

## 2022-11-19 ENCOUNTER — Ambulatory Visit: Payer: PPO

## 2022-11-21 NOTE — Telephone Encounter (Signed)
Left message to call back to reschedule tele. This is there 2nd attempt to reschedule since pt has missed his original tele pre op appt for the procedure.

## 2022-11-21 NOTE — Telephone Encounter (Signed)
I will update the requesting office the pt has missed his tele appt and had not returned our call to reschedule. Procedure may need to be post poned as all tele visits are booked  before his procedure date.

## 2022-11-22 ENCOUNTER — Other Ambulatory Visit: Payer: Self-pay | Admitting: Cardiovascular Disease

## 2022-11-22 ENCOUNTER — Other Ambulatory Visit: Payer: Self-pay | Admitting: Family Medicine

## 2022-11-22 NOTE — Telephone Encounter (Signed)
Please contact pt for future appointment. Pt due for follow up.

## 2022-11-23 ENCOUNTER — Telehealth: Payer: Self-pay

## 2022-11-23 NOTE — Telephone Encounter (Signed)
I do not believe he is supposed to be on metformin. Please contact him and see if he has been taking this. Thanks.

## 2022-11-26 ENCOUNTER — Ambulatory Visit: Admission: RE | Admit: 2022-11-26 | Payer: PPO | Source: Home / Self Care

## 2022-11-26 ENCOUNTER — Ambulatory Visit: Payer: PPO

## 2022-11-26 SURGERY — COLONOSCOPY WITH PROPOFOL
Anesthesia: General

## 2022-11-27 ENCOUNTER — Telehealth: Payer: Self-pay

## 2022-11-27 NOTE — Telephone Encounter (Signed)
Spoke to Patient regarding his Metformin.

## 2022-11-27 NOTE — Telephone Encounter (Signed)
I will update the requesting office the pt has not returned our calls. Pt needs to call back and reschedule the tele visit he missed for his pre op clearance. I will remove from the pre op call back pool until the pt calls back.

## 2022-11-27 NOTE — Telephone Encounter (Signed)
Patient states he is not sure whether or not he should be taking his metformin.  Please call to clarify.

## 2022-11-28 ENCOUNTER — Inpatient Hospital Stay: Payer: PPO | Attending: Oncology

## 2022-11-28 DIAGNOSIS — E538 Deficiency of other specified B group vitamins: Secondary | ICD-10-CM | POA: Insufficient documentation

## 2022-11-28 DIAGNOSIS — Z85038 Personal history of other malignant neoplasm of large intestine: Secondary | ICD-10-CM | POA: Diagnosis present

## 2022-11-28 MED ORDER — CYANOCOBALAMIN 1000 MCG/ML IJ SOLN
1000.0000 ug | INTRAMUSCULAR | Status: DC
Start: 2022-11-28 — End: 2022-11-28
  Administered 2022-11-28: 1000 ug via INTRAMUSCULAR
  Filled 2022-11-28: qty 1

## 2022-12-05 ENCOUNTER — Ambulatory Visit: Payer: PPO | Attending: Cardiovascular Disease

## 2022-12-05 DIAGNOSIS — I4819 Other persistent atrial fibrillation: Secondary | ICD-10-CM

## 2022-12-05 DIAGNOSIS — Z7901 Long term (current) use of anticoagulants: Secondary | ICD-10-CM | POA: Diagnosis not present

## 2022-12-05 LAB — POCT INR: INR: 2.5 (ref 2.0–3.0)

## 2022-12-05 NOTE — Patient Instructions (Signed)
CONTINUE  0.5 TABLET DAILY, EXCEPT 1 TABLET ON MONDAYS, WEDNESDAYS AND FRIDAYS.  Recheck INR in 6 weeks.     [phone]

## 2022-12-12 ENCOUNTER — Encounter: Payer: PPO | Admitting: Internal Medicine

## 2022-12-13 ENCOUNTER — Ambulatory Visit: Payer: PPO | Attending: Internal Medicine | Admitting: Internal Medicine

## 2022-12-13 VITALS — BP 146/68 | HR 50 | Wt 164.0 lb

## 2022-12-13 DIAGNOSIS — N1832 Chronic kidney disease, stage 3b: Secondary | ICD-10-CM | POA: Insufficient documentation

## 2022-12-13 DIAGNOSIS — I5022 Chronic systolic (congestive) heart failure: Secondary | ICD-10-CM

## 2022-12-13 DIAGNOSIS — E782 Mixed hyperlipidemia: Secondary | ICD-10-CM | POA: Diagnosis not present

## 2022-12-13 DIAGNOSIS — R9431 Abnormal electrocardiogram [ECG] [EKG]: Secondary | ICD-10-CM | POA: Diagnosis not present

## 2022-12-13 DIAGNOSIS — Z955 Presence of coronary angioplasty implant and graft: Secondary | ICD-10-CM | POA: Insufficient documentation

## 2022-12-13 DIAGNOSIS — I255 Ischemic cardiomyopathy: Secondary | ICD-10-CM | POA: Diagnosis not present

## 2022-12-13 DIAGNOSIS — N183 Chronic kidney disease, stage 3 unspecified: Secondary | ICD-10-CM

## 2022-12-13 DIAGNOSIS — I130 Hypertensive heart and chronic kidney disease with heart failure and stage 1 through stage 4 chronic kidney disease, or unspecified chronic kidney disease: Secondary | ICD-10-CM | POA: Diagnosis not present

## 2022-12-13 DIAGNOSIS — M25511 Pain in right shoulder: Secondary | ICD-10-CM | POA: Diagnosis not present

## 2022-12-13 DIAGNOSIS — I4819 Other persistent atrial fibrillation: Secondary | ICD-10-CM

## 2022-12-13 DIAGNOSIS — I10 Essential (primary) hypertension: Secondary | ICD-10-CM | POA: Diagnosis not present

## 2022-12-13 DIAGNOSIS — I2510 Atherosclerotic heart disease of native coronary artery without angina pectoris: Secondary | ICD-10-CM | POA: Diagnosis present

## 2022-12-13 DIAGNOSIS — I428 Other cardiomyopathies: Secondary | ICD-10-CM | POA: Insufficient documentation

## 2022-12-13 NOTE — Patient Instructions (Addendum)
  Lab Work:  Labs done today, your results will be available in MyChart, we will contact you for abnormal readings.   Referrals:  You have been referred to Orthopedics for your Right Shoulder Pain. Their clinic will call you schedule your appointment.   [address] [place] [address]  [phone]   Special Instructions // Education:  Do the following things EVERYDAY: Weigh yourself in the morning before breakfast. Write it down and keep it in a log. Take your medicines as prescribed Eat low salt foods--Limit salt (sodium) to 2000 mg per day.  Stay as active as you can everyday Limit all fluids for the day to less than 2 liters   Follow-Up in: Please call in February to schedule your March appointment.     If you have any questions or concerns before your next appointment please send [place] a message through [place] or call our office at [phone] Monday-Friday 8 am-5 pm.   If you have an urgent need after hours on the weekend please call your Primary Cardiologist or the [facility] in [place] at [phone].   At the [facility], you and your health needs are our priority. We have a designated team specialized in the treatment of Heart Failure. This Care Team includes your primary Heart Failure Specialized Cardiologist (physician), Advanced Practice Providers (APPs- Physician Assistants and Nurse Practitioners), and Pharmacist who all work together to provide you with the care you need, when you need it.   You may see any of the following providers on your designated Care Team at your next follow up:  Dr. Arvilla Meres Dr. Marca Ancona Dr. Dorthula Nettles Dr. Theresia Bough Tonye Becket, NP Robbie Lis, [place] [address] [place], [place] Brynda Peon, NP [place] Lee, NP Clarisa Kindred, NP Enos Fling, PharmD

## 2022-12-13 NOTE — Progress Notes (Signed)
ADVANCED HF CLINIC NOTE  Referring Physician: Ward Givens, NP Primary Care: Glori Luis, MD Primary Cardiologist: Lorine Bears, MD   HPI:  [name] is a 77 year old male with a history of CAD status post circumflex stenting, chronic HFrEF, mixed ischemic and nonischemic cardiomyopathy, hypertension, hyperlipidemia, stage III chronic kidney disease, and colon cancer, who presents for heart failure follow-up.   Found to have colon CA in 2015. Underwent colectomy by chemotherapy with oxaliplatin and fluorouracil.   In January 2017, he was diagnosed with systolic heart failure. Echo EF 20-25% with moderate MR/TR, and moderate to severe pulmonary hypertension.  Right and left heart cardiac catheterization showed moderate to severe pulmonary hypertension with severely elevated filling pressures and severely reduced cardiac output.  Coronary angiography showed severe one-vessel CAD with an 80 to 90% stenosis in the proximal left circumflex, and mild diffuse disease involving the LAD and RCA.  He underwent PCI of the left circumflex in May 2017.  Follow-up echo in November 2020, showed an EF of 30 to 35%.  In the setting of PVCs, outpatient monitoring was performed and showed a 6% PVC burden, which improved with resumption of carvedilol.    RHC 1/17  RA = 9  PA = 70/24 (40) PCWP = 30  (v = 35) Fick CO/CI = 3.3/1.3 PVR = 3.0 WU  Echo in April 2021 showed persistent LV dysfunction EF 25-30%, severe PAH, mild to moderate MR.  He was offered EP referral for ICD therapy however, he declined. In April 2023, he required admission secondary to massive volume overload and weight gain despite titration of outpatient diuretics.  Echo 4/23  EF 20-25% with severely dilated left ventricle, severely reduced RV function, severe biatrial lodgment, severe MR/TR, circumferential pericardial effusion, and trivial AI. Discharged home at 151 pounds.    Here for follow-up. Feels good. No CP or SOB. Stays  active. Mowing grass and cutting wood. Only complaint is right shoulder pain which has been going on for several months. Compliant with meds.   He has previously declined EP referral for ICD. He was unable to afford Entresto but was willing to start losartan   FHx:  Mother died when was 91 months old Unsure how his Dad passed 2 half brothers - he doesn't know them Doesn't know much about his family 4 kids - 7 grandkids    Past Medical History:  Diagnosis Date   Adenocarcinoma of colon (HCC) 06/19/2013   a. moderately differentiated stage IIIc (T4bN2a M0) adenocarcinoma of colon  s/p colectomy followed by chemoRx with oxaliplatin & fluorouacil.   Anemia    CAD (coronary artery disease)    a. 02/2015 Cath: LM nl, LAD 50/40 mid/distal, LCX 80p, RCA 40p, 30d, RPL1 40, CO 3.27, CI 1.65; b. 06/09/15 PCI: LM minor irregs, m-dLAD 50%, dLAD 40%, pLCx 80% (s/p PCI/DES 0%), pRCA 40%, dRCA 30%, 1st RPLB 40%   Chronic kidney disease, stage 3a (HCC)    Chronic systolic CHF (congestive heart failure) (HCC)    a. 02/2015 Echo: EF 20-25%, diff HK, mod MR, mildy dil LA, mildly dil RV w/ mod RV syst dysfxn, mildly dil RA, mod TR, PASP ; b. 09/2017 Echo: EF 40-45%, diff HK. Gr1 DD. Mild to mod MR. Mildly to mod dil LA; c. 09/2019 Echo: EF 25-30%, glob HK, mod reduced RV fxn, sev elev PASP. Mod dil LA. Mild-mod MR; d. 05/2021 Echo: EF 20-25%, glob HK, sev red RV fxn, sev BAE, sev MR/TR/TS, triv AI.   Diabetes  mellitus without complication [facility])    Essential hypertension    Hypertensive heart disease    Left inguinal hernia 05/2020   Mixed Ischemic and Non-ischemic Cardiomyopathy    a. 02/2015 EF 20-25%, diff HK; b. 09/2016 EF 40-45%; c. 09/2019 EF 25-30%; d. 05/2021 Echo: EF 20-25%, glob HK.   Persistent atrial fibrillation (HCC)    a. Dx 05/2021-->CHA2DS2VASc = 5-->eliquis.   Pulmonary hypertension (HCC)    Severe mitral regurgitation    a. 09/2017 Echo: Mild to mod MR; b. 09/2019 Echo: mild-mod MR; c.  05/2021 Echo: Sev MR/TR/TS.   Tubular adenoma of colon     Current Outpatient Medications  Medication Sig Dispense Refill   amLODipine (NORVASC) 5 MG tablet Take 5 mg by mouth daily.     aspirin 81 MG chewable tablet Chew by mouth daily.     atorvastatin (LIPITOR) 40 MG tablet Take 1 tablet (40 mg total) by mouth daily. 90 tablet 3   carvedilol (COREG) 3.125 MG tablet TAKE 1 TABLET BY MOUTH TWICE DAILY WITH MEALS 60 tablet 3   cholecalciferol (VITAMIN D3) 25 MCG (1000 UNIT) tablet Take 1,000 Units by mouth daily.     clopidogrel (PLAVIX) 75 MG tablet Take 75 mg by mouth daily.     dapagliflozin propanediol (FARXIGA) 10 MG TABS tablet Take 1 tablet (10 mg total) by mouth daily before breakfast. 30 tablet 6   ferrous sulfate 325 (65 FE) MG tablet Take 325 mg by mouth daily with breakfast.     furosemide (LASIX) 20 MG tablet Take 20 mg by mouth.     loratadine (CLARITIN) 10 MG tablet TAKE 1 TABLET(10 MG) BY MOUTH DAILY 30 tablet 11   losartan (COZAAR) 100 MG tablet Take 1 tablet (100 mg total) by mouth daily. 30 tablet 6   Magnesium Oxide 400 MG CAPS Take 1 capsule (400 mg total) by mouth daily. 90 capsule 3   metFORMIN (GLUCOPHAGE) 500 MG tablet Take 500 mg by mouth 2 (two) times daily with a meal.     potassium chloride SA (KLOR-CON M) 20 MEQ tablet TAKE 2 TABLETS(40 MEQ) BY MOUTH DAILY 60 tablet 6   spironolactone (ALDACTONE) 25 MG tablet TAKE 1 TABLET(25 MG) BY MOUTH DAILY 30 tablet 5   torsemide (DEMADEX) 20 MG tablet Take 20 mg by mouth daily.     warfarin (COUMADIN) 5 MG tablet TAKE 1/2(2.5MG ) TO 1 TABLET(5 MG) BY MOUTH EVERY DAY AS DIRECTED BY COUMADIN CLINIC 50 tablet 1   gabapentin (NEURONTIN) 100 MG capsule Take 100 mg by mouth 3 (three) times daily. Taking 2 -100 mg pills in am and 1 -100 mg at night (Patient not taking: Reported on 08/21/2022)     No current facility-administered medications for this visit.    Allergies  Allergen Reactions   Shellfish Allergy Other (See  Comments) and Hives    Pt. instructed by MD to avoid seafood      Social History   Socioeconomic History   Marital status: Married    Spouse name: Carney Bern   Number of children: 4   Years of education: Not on file   Highest education level: Not on file  Occupational History   Occupation: retired  Tobacco Use   Smoking status: Never   Smokeless tobacco: Never  Vaping Use   Vaping status: Never Used  Substance and Sexual Activity   Alcohol use: Not Currently    Comment: none in many years   Drug use: No   Sexual activity: Yes  Other Topics Concern   Not on file  Social History Narrative   Not on file   Social Determinants of Health   Financial Resource Strain: Low Risk  (10/17/2022)   Overall Financial Resource Strain (CARDIA)    Difficulty of Paying Living Expenses: Not hard at all  Food Insecurity: No Food Insecurity (10/17/2022)   Hunger Vital Sign    Worried About Running Out of Food in the Last Year: Never true    Ran Out of Food in the Last Year: Never true  Transportation Needs: No Transportation Needs (10/17/2022)   PRAPARE - Administrator, Civil Service (Medical): No    Lack of Transportation (Non-Medical): No  Physical Activity: Sufficiently Active (10/17/2022)   Exercise Vital Sign    Days of Exercise per Week: 4 days    Minutes of Exercise per Session: 60 min  Stress: No Stress Concern Present (10/17/2022)   [employer] of Occupational Health - Occupational Stress Questionnaire    Feeling of Stress : Not at all  Social Connections: Moderately Isolated (10/17/2022)   Social Connection and Isolation Panel [NHANES]    Frequency of Communication with Friends and Family: Never    Frequency of Social Gatherings with Friends and Family: Once a week    Attends Religious Services: More than 4 times per year    Active Member of [employer] or Organizations: No    Attends Banker Meetings: Never    Marital Status: Married  Catering manager  Violence: Not At Risk (10/17/2022)   Humiliation, Afraid, Rape, and Kick questionnaire    Fear of Current or Ex-Partner: No    Emotionally Abused: No    Physically Abused: No    Sexually Abused: No      Family History  Problem Relation Age of Onset   Cancer Mother    Other Father        unknown medical history    Vitals:   12/13/22 1039 12/13/22 1114  BP: (!) 155/78 (!) 146/68  Pulse: (!) 50   SpO2: 100%   Weight: 164 lb (74.4 kg)     PHYSICAL EXAM: General:  Well appearing. No resp difficulty HEENT: normal Neck: supple. JVP 7 Carotids 2+ bilat; no bruits. No lymphadenopathy or thryomegaly appreciated. Cor: PMI nondisplaced. Regular rate & rhythm. No rubs, gallops or murmurs. Lungs: clear Abdomen: soft, nontender, nondistended. No hepatosplenomegaly. No bruits or masses. Good bowel sounds. Extremities: no cyanosis, clubbing, rash, trace edema Neuro: alert & orientedx3, cranial nerves grossly intact. moves all 4 extremities w/o difficulty. Affect pleasant  Sinus brady 45 1AVB Personally reviewed  ASSESSMENT & PLAN:   1.  Chronic systolic HF due to mixed NICM/ICM - primarily NICM - Onset 1/17. Echo EF 20-25% with moderate MR/TR - Cath 1/17: severe 1v CAD with 80-90% stenosis in the proximal left circumflex -> PCI, and mild diffuse disease inLAD and RCA.  - Echo 11/20 EF 30-35%.   - Monitor with 6% PVC burden,  - Echo 4/23 EF 20-25% with severely dilated left ventricle, severely reduced RV function, severe biatrial lodgment, severe MR/TR, circumferential pericardial effusion, and trivial AI.   - cMRI 3/24 LVEF 24% the trabeculations but doesn't meet criteria for LVNC. [place] LGE (noischemic) RVEF 24% - Stable NYHA II  - Volume status ok on torsemide 20 daily - Continue losartan100 daily (again refused Entresto due to cost)  - Continue carvedilol 3.125 bid unable to titrate with bradycardia. May need to stop - Continue spiro 25 daily -  Continue Farxiga 10  - We  discussed adding Bidil but he wants to defer given cos and tid nature of meds - Remains uninterested in ICD - Return in 4 months with repeat echo   2.  Persistent atrial fibrillation:  - Remains in sinus - On warfarin because Eliquis was cost prohibitive.   3.  Coronary artery disease:  -  Status post circumflex stenting in 2017. -  No aspirin in the setting of warfarin. -  No s/s angina   4.  Essential hypertension:  - Blood pressure remains elevated - We discussed various options to address (switching losartan to Entresto, adding Bidil or amlodipine but he prefers not to make a change today)   5.  Stage IIIB chronic kidney disease:  - Scr 1.5-1.7 - Continue ARB/SGLT2i - Labs today.   Arvilla Meres, MD  11:14 AM

## 2022-12-14 LAB — BASIC METABOLIC PANEL
BUN/Creatinine Ratio: 12 (ref 10–24)
BUN: 21 mg/dL (ref 8–27)
CO2: 20 mmol/L (ref 20–29)
Calcium: 9.3 mg/dL (ref 8.6–10.2)
Chloride: 110 mmol/L — ABNORMAL HIGH (ref 96–106)
Creatinine, Ser: 1.75 mg/dL — ABNORMAL HIGH (ref 0.76–1.27)
Glucose: 75 mg/dL (ref 70–99)
Potassium: 4.4 mmol/L (ref 3.5–5.2)
Sodium: 144 mmol/L (ref 134–144)
eGFR: 40 mL/min/{1.73_m2} — ABNORMAL LOW (ref >59)

## 2022-12-14 LAB — BRAIN NATRIURETIC PEPTIDE: BNP: 2083.6 pg/mL — ABNORMAL HIGH (ref 0.0–100.0)

## 2022-12-21 ENCOUNTER — Other Ambulatory Visit: Payer: Self-pay | Admitting: Cardiovascular Disease

## 2022-12-21 NOTE — Telephone Encounter (Signed)
Please contact pt for future appointment. Pt due for follow up with general cardiology.

## 2022-12-26 ENCOUNTER — Other Ambulatory Visit: Payer: Self-pay | Admitting: Cardiovascular Disease

## 2022-12-26 ENCOUNTER — Other Ambulatory Visit: Payer: Self-pay | Admitting: Cardiology

## 2022-12-26 NOTE — Telephone Encounter (Signed)
Refill request

## 2022-12-27 LAB — HM DIABETES EYE EXAM

## 2022-12-31 ENCOUNTER — Inpatient Hospital Stay: Payer: PPO | Attending: Oncology

## 2023-01-16 ENCOUNTER — Ambulatory Visit: Payer: PPO | Attending: Cardiovascular Disease

## 2023-01-16 DIAGNOSIS — I4819 Other persistent atrial fibrillation: Secondary | ICD-10-CM

## 2023-01-16 DIAGNOSIS — Z7901 Long term (current) use of anticoagulants: Secondary | ICD-10-CM | POA: Diagnosis not present

## 2023-01-16 LAB — POCT INR: INR: 5.5 — AB (ref 2.0–3.0)

## 2023-01-16 NOTE — Patient Instructions (Signed)
HOLD TODAY, THURSDAY and FRIDAY THEN CONTINUE  0.5 TABLET DAILY, EXCEPT 1 TABLET ON MONDAYS, WEDNESDAYS AND FRIDAYS.  Recheck INR in 4 weeks.     [phone]

## 2023-01-23 ENCOUNTER — Encounter: Payer: Self-pay | Admitting: Family Medicine

## 2023-01-23 ENCOUNTER — Ambulatory Visit (INDEPENDENT_AMBULATORY_CARE_PROVIDER_SITE_OTHER): Payer: PPO | Admitting: Family Medicine

## 2023-01-23 VITALS — BP 128/78 | HR 62 | Temp 97.7°F | Ht 69.0 in | Wt 167.6 lb

## 2023-01-23 DIAGNOSIS — E1159 Type 2 diabetes mellitus with other circulatory complications: Secondary | ICD-10-CM

## 2023-01-23 DIAGNOSIS — S81801A Unspecified open wound, right lower leg, initial encounter: Secondary | ICD-10-CM

## 2023-01-23 DIAGNOSIS — I10 Essential (primary) hypertension: Secondary | ICD-10-CM | POA: Diagnosis not present

## 2023-01-23 DIAGNOSIS — I4819 Other persistent atrial fibrillation: Secondary | ICD-10-CM | POA: Diagnosis not present

## 2023-01-23 DIAGNOSIS — Z1211 Encounter for screening for malignant neoplasm of colon: Secondary | ICD-10-CM

## 2023-01-23 MED ORDER — LANCETS MISC. MISC: 1.0000 | Freq: Every day | 0 refills | Status: DC | PRN

## 2023-01-23 MED ORDER — BLOOD GLUCOSE MONITORING SUPPL DEVI: 1.0000 | Freq: Every day | 0 refills | Status: DC

## 2023-01-23 MED ORDER — LANCET DEVICE MISC: 1.0000 | Freq: Every day | 0 refills | Status: DC | PRN

## 2023-01-23 MED ORDER — BLOOD GLUCOSE TEST VI STRP: 1.0000 | ORAL_STRIP | Freq: Every day | 0 refills | Status: DC | PRN

## 2023-01-23 NOTE — Patient Instructions (Signed)
Nice to see you. Wound care should contact you to schedule a visit for your right leg wound.  If you develop signs of infection such as drainage of pus, spreading redness, fevers, or you feel ill please seek medical attention immediately. I sent in a glucometer for you to use to check your sugar.  You need to do this if you feel like your sugar is low.

## 2023-01-23 NOTE — Assessment & Plan Note (Signed)
Chronic issue.  Previously very well-controlled.  Patient will continue Farxiga 10 mg daily and metformin 500 mg twice daily.  Glucometer sent to pharmacy for patient to check glucose levels particularly if he feels like he is having low glucose.

## 2023-01-23 NOTE — Assessment & Plan Note (Signed)
No obvious signs of infection on exam.  He can cleanse with soap and water.  He can continue to dress the area as he has been.  Advised to monitor for signs of infection and seek medical attention if those occur.  We will refer to wound care for further management of this issue.

## 2023-01-23 NOTE — Assessment & Plan Note (Signed)
Chronic issue.  Sinus rhythm.  Patient will continue management through cardiology.

## 2023-01-23 NOTE — Assessment & Plan Note (Signed)
Chronic issue.  Well-controlled.  Patient will continue spironolactone 25 mg daily, torsemide 20 mg daily, losartan 100 mg daily, carvedilol 3.125 mg twice daily.

## 2023-01-23 NOTE — Progress Notes (Signed)
Marikay Alar, MD Phone: [phone]  [name]. is a 77 y.o. male who presents today for follow-up.  A-fib/hypertension: Patient does not check blood pressures.  He is taking amlodipine, carvedilol, losartan, spironolactone, and torsemide.  No chest pain, shortness of breath, edema, palpitations, or bleeding issues.  Diabetes: Not checking sugars.  He is taking [place] and metformin.  A1c was in the normal range most recently.  No polyuria or polydipsia.  Once a day he will feel shaky and sweaty.  He does not have a way to check his sugars.  He notes the sensation resolves on its own.  Right leg wound: Patient notes this has been present for a few weeks.  He notes no injury or specific cause for this.  He notes no pain.  No fevers.  Some serous drainage.  Has seen wound care in the past for other wound issues.  Notes his daughter has been dressing the area with nonstick pads and gauze.  Social History   Tobacco Use  Smoking Status Never  Smokeless Tobacco Never    Current Outpatient Medications on File Prior to Visit  Medication Sig Dispense Refill   amLODipine (NORVASC) 5 MG tablet Take 5 mg by mouth daily.     aspirin 81 MG chewable tablet Chew by mouth daily.     atorvastatin (LIPITOR) 40 MG tablet TAKE 1 TABLET(40 MG) BY MOUTH DAILY 90 tablet 3   carvedilol (COREG) 3.125 MG tablet TAKE 1 TABLET BY MOUTH TWICE DAILY WITH MEALS 60 tablet 3   cholecalciferol (VITAMIN D3) 25 MCG (1000 UNIT) tablet Take 1,000 Units by mouth daily.     clopidogrel (PLAVIX) 75 MG tablet Take 75 mg by mouth daily.     FARXIGA 10 MG TABS tablet TAKE 1 TABLET(10 MG) BY MOUTH DAILY BEFORE BREAKFAST 30 tablet 6   ferrous sulfate 325 (65 FE) MG tablet Take 325 mg by mouth daily with breakfast.     furosemide (LASIX) 20 MG tablet Take 20 mg by mouth.     loratadine (CLARITIN) 10 MG tablet TAKE 1 TABLET(10 MG) BY MOUTH DAILY 30 tablet 11   losartan (COZAAR) 100 MG tablet Take 1 tablet (100 mg total)  by mouth daily. 30 tablet 6   Magnesium Oxide 400 MG CAPS Take 1 capsule (400 mg total) by mouth daily. 90 capsule 3   metFORMIN (GLUCOPHAGE) 500 MG tablet Take 500 mg by mouth 2 (two) times daily with a meal.     potassium chloride SA (KLOR-CON M) 20 MEQ tablet TAKE 2 TABLETS(40 MEQ) BY MOUTH DAILY 60 tablet 6   spironolactone (ALDACTONE) 25 MG tablet TAKE 1 TABLET(25 MG) BY MOUTH DAILY 30 tablet 5   torsemide (DEMADEX) 20 MG tablet Take 20 mg by mouth daily.     warfarin (COUMADIN) 5 MG tablet TAKE 1/2 TO 1 TABLET BY MOUTH EVERY DAY AS DIRECTED BY COUMADIN CLINIC 70 tablet 1   gabapentin (NEURONTIN) 100 MG capsule Take 100 mg by mouth 3 (three) times daily. Taking 2 -100 mg pills in am and 1 -100 mg at night (Patient not taking: Reported on 08/21/2022)     No current facility-administered medications on file prior to visit.     ROS see history of present illness  Objective  Physical Exam Vitals:   01/23/23 1309  BP: 128/78  Pulse: 62  Temp: 97.7 F (36.5 C)  SpO2: 95%    BP Readings from Last 3 Encounters:  01/23/23 128/78  12/13/22 (!) 146/68  10/23/22 118/74   Wt Readings from Last 3 Encounters:  01/23/23 167 lb 9.6 oz (76 kg)  12/13/22 164 lb (74.4 kg)  10/23/22 162 lb 12.8 oz (73.8 kg)    Physical Exam Constitutional:      General: He is not in acute distress.    Appearance: He is not diaphoretic.  Cardiovascular:     Rate and Rhythm: Normal rate and regular rhythm.     Heart sounds: Normal heart sounds.  Pulmonary:     Effort: Pulmonary effort is normal.     Breath sounds: Normal breath sounds.  Skin:    General: Skin is warm and dry.  Neurological:     Mental Status: He is alert.     Right lower extremity   Right lower extremity  Assessment/Plan: Please see individual problem list.  Primary hypertension Assessment & Plan: Chronic issue.  Well-controlled.  Patient will continue spironolactone 25 mg daily, torsemide 20 mg daily, losartan 100 mg  daily, carvedilol 3.125 mg twice daily.   Type 2 diabetes mellitus with other circulatory complication, without long-term current use of insulin (HCC) Assessment & Plan: Chronic issue.  Previously very well-controlled.  Patient will continue Farxiga 10 mg daily and metformin 500 mg twice daily.  Glucometer sent to pharmacy for patient to check glucose levels particularly if he feels like he is having low glucose.  Orders: -     Blood Glucose Monitoring Suppl; 1 each by Does not apply route daily. May substitute to any manufacturer covered by patient's insurance.  Dispense: 1 each; Refill: 0 -     Blood Glucose Test; 1 each by In Vitro route daily as needed. May substitute to any manufacturer covered by patient's insurance.  Dispense: 100 strip; Refill: 0 -     Lancet Device; 1 each by Does not apply route daily as needed. May substitute to any manufacturer covered by patient's insurance.  Dispense: 1 each; Refill: 0 -     Lancets Misc.; 1 each by Does not apply route daily as needed. May substitute to any manufacturer covered by patient's insurance.  Dispense: 100 each; Refill: 0  Persistent atrial fibrillation (HCC) Assessment & Plan: Chronic issue.  Sinus rhythm.  Patient will continue management through cardiology.   Leg wound, right, initial encounter Assessment & Plan: No obvious signs of infection on exam.  He can cleanse with soap and water.  He can continue to dress the area as he has been.  Advised to monitor for signs of infection and seek medical attention if those occur.  We will refer to wound care for further management of this issue.  Orders: -     Ambulatory referral to Wound Clinic  Colon cancer screening -     Ambulatory referral to Gastroenterology   Return in about 3 months (around 04/23/2023) for Transfer to Dr. Clent Ridges.   Marikay Alar, MD [facility]

## 2023-01-28 ENCOUNTER — Telehealth: Payer: Self-pay

## 2023-01-28 ENCOUNTER — Inpatient Hospital Stay: Payer: PPO

## 2023-01-28 NOTE — Telephone Encounter (Signed)
Copied from CRM [id number]. Topic: Referral - Question >> Jan 28, 2023 11:20 AM Leavy Cella D wrote: Reason for CRM: Velna Hatchet from [facility] has been trying to get in contact with patient to schedule an appointment  but they are unable to her patient over the phone . Velna Hatchet ask for someone to contact patient to call Outpatient Center to schedule appointment. [facility] [phone]

## 2023-01-28 NOTE — Telephone Encounter (Signed)
Left message for Patient to call [phone] which is [facility] to schedule an appointment.

## 2023-02-04 ENCOUNTER — Inpatient Hospital Stay: Payer: PPO | Attending: Oncology

## 2023-02-04 DIAGNOSIS — E538 Deficiency of other specified B group vitamins: Secondary | ICD-10-CM | POA: Diagnosis present

## 2023-02-04 DIAGNOSIS — Z85038 Personal history of other malignant neoplasm of large intestine: Secondary | ICD-10-CM | POA: Insufficient documentation

## 2023-02-04 MED ORDER — CYANOCOBALAMIN 1000 MCG/ML IJ SOLN
1000.0000 ug | INTRAMUSCULAR | Status: DC
Start: 2023-02-04 — End: 2023-02-04
  Administered 2023-02-04: 1000 ug via INTRAMUSCULAR
  Filled 2023-02-04: qty 1

## 2023-02-13 ENCOUNTER — Ambulatory Visit: Payer: PPO | Attending: Cardiovascular Disease

## 2023-02-13 DIAGNOSIS — I4819 Other persistent atrial fibrillation: Secondary | ICD-10-CM | POA: Diagnosis not present

## 2023-02-13 DIAGNOSIS — Z7901 Long term (current) use of anticoagulants: Secondary | ICD-10-CM | POA: Diagnosis not present

## 2023-02-13 LAB — POCT INR: INR: 5.5 — AB (ref 2.0–3.0)

## 2023-02-13 NOTE — Patient Instructions (Signed)
 HOLD TODAY, THURSDAY, FRIDAY and SATURDAY THEN CONTINUE  0.5 TABLET DAILY, EXCEPT 1 TABLET ON MONDAYS, WEDNESDAYS AND FRIDAYS.  Recheck INR in 1 week.  Be consistent with greens per week.   [phone]

## 2023-02-20 ENCOUNTER — Ambulatory Visit: Payer: PPO | Attending: Cardiovascular Disease

## 2023-02-20 DIAGNOSIS — I4819 Other persistent atrial fibrillation: Secondary | ICD-10-CM

## 2023-02-20 DIAGNOSIS — Z7901 Long term (current) use of anticoagulants: Secondary | ICD-10-CM

## 2023-02-20 LAB — POCT INR: INR: 5.3 — AB (ref 2.0–3.0)

## 2023-02-20 NOTE — Patient Instructions (Signed)
 HOLD TODAY, THURSDAY and FRIDAY THEN CONTINUE  0.5 TABLET DAILY, EXCEPT 1 TABLET ON MONDAYS, WEDNESDAYS AND FRIDAYS.  Recheck INR in 1 week.  Be consistent with greens per week.  Green beans do not contain Vitamin K   [phone]

## 2023-02-27 ENCOUNTER — Telehealth: Payer: Self-pay | Admitting: Internal Medicine

## 2023-02-27 ENCOUNTER — Encounter: Payer: Self-pay | Admitting: Hematology and Oncology

## 2023-02-27 ENCOUNTER — Ambulatory Visit: Payer: PPO | Attending: Cardiovascular Disease

## 2023-02-27 ENCOUNTER — Other Ambulatory Visit: Payer: Self-pay

## 2023-02-27 DIAGNOSIS — Z7901 Long term (current) use of anticoagulants: Secondary | ICD-10-CM | POA: Diagnosis not present

## 2023-02-27 DIAGNOSIS — I4819 Other persistent atrial fibrillation: Secondary | ICD-10-CM

## 2023-02-27 LAB — POCT INR: INR: 4.8 — AB (ref 2.0–3.0)

## 2023-02-27 MED ORDER — TORSEMIDE 20 MG PO TABS: 20.0000 mg | ORAL_TABLET | Freq: Every day | ORAL | 11 refills | Status: DC

## 2023-02-27 NOTE — Patient Instructions (Signed)
HOLD THURSDAY and FRIDAY THEN DECREASE TO 0.5 TABLET DAILY, EXCEPT 1 TABLET ON Wednesdays.  Recheck INR in 2 weeks.  Be consistent with greens per week.  Green beans do not contain Vitamin K

## 2023-02-28 ENCOUNTER — Telehealth: Payer: Self-pay

## 2023-02-28 ENCOUNTER — Inpatient Hospital Stay: Payer: PPO | Attending: Oncology

## 2023-02-28 DIAGNOSIS — E538 Deficiency of other specified B group vitamins: Secondary | ICD-10-CM | POA: Insufficient documentation

## 2023-02-28 DIAGNOSIS — Z85038 Personal history of other malignant neoplasm of large intestine: Secondary | ICD-10-CM | POA: Diagnosis present

## 2023-02-28 MED ORDER — CYANOCOBALAMIN 1000 MCG/ML IJ SOLN
1000.0000 ug | INTRAMUSCULAR | Status: DC
Start: 2023-02-28 — End: 2023-02-28
  Administered 2023-02-28: 1000 ug via INTRAMUSCULAR
  Filled 2023-02-28: qty 1

## 2023-02-28 NOTE — Telephone Encounter (Signed)
Spoke to patient regarding his B12 injection. He stated that his wife had a fall this morning so that he would be late getting to the clinic for his injection.

## 2023-03-13 ENCOUNTER — Ambulatory Visit: Payer: PPO | Attending: Cardiovascular Disease

## 2023-03-13 DIAGNOSIS — Z7901 Long term (current) use of anticoagulants: Secondary | ICD-10-CM | POA: Diagnosis not present

## 2023-03-13 DIAGNOSIS — I4819 Other persistent atrial fibrillation: Secondary | ICD-10-CM

## 2023-03-13 LAB — POCT INR: INR: 4.5 — AB (ref 2.0–3.0)

## 2023-03-13 NOTE — Patient Instructions (Signed)
 HOLD Thursday, Friday and Saturday  DECREASE TO 0.5 TABLET DAILY.    Recheck INR in 3 weeks.  Be consistent with greens 3 per week.  Green beans do not contain Vitamin K   [phone]

## 2023-03-19 ENCOUNTER — Encounter: Payer: PPO | Attending: Physician Assistant | Admitting: Physician Assistant

## 2023-03-19 DIAGNOSIS — I890 Lymphedema, not elsewhere classified: Secondary | ICD-10-CM | POA: Insufficient documentation

## 2023-03-19 DIAGNOSIS — I5042 Chronic combined systolic (congestive) and diastolic (congestive) heart failure: Secondary | ICD-10-CM | POA: Diagnosis not present

## 2023-03-19 DIAGNOSIS — L97812 Non-pressure chronic ulcer of other part of right lower leg with fat layer exposed: Secondary | ICD-10-CM | POA: Insufficient documentation

## 2023-03-19 DIAGNOSIS — I110 Hypertensive heart disease with heart failure: Secondary | ICD-10-CM | POA: Insufficient documentation

## 2023-03-19 DIAGNOSIS — I480 Paroxysmal atrial fibrillation: Secondary | ICD-10-CM | POA: Insufficient documentation

## 2023-03-19 DIAGNOSIS — I2510 Atherosclerotic heart disease of native coronary artery without angina pectoris: Secondary | ICD-10-CM | POA: Diagnosis not present

## 2023-03-19 DIAGNOSIS — L97822 Non-pressure chronic ulcer of other part of left lower leg with fat layer exposed: Secondary | ICD-10-CM | POA: Diagnosis not present

## 2023-03-19 DIAGNOSIS — E11622 Type 2 diabetes mellitus with other skin ulcer: Secondary | ICD-10-CM | POA: Insufficient documentation

## 2023-03-19 DIAGNOSIS — L97312 Non-pressure chronic ulcer of right ankle with fat layer exposed: Secondary | ICD-10-CM | POA: Insufficient documentation

## 2023-03-19 DIAGNOSIS — I87333 Chronic venous hypertension (idiopathic) with ulcer and inflammation of bilateral lower extremity: Secondary | ICD-10-CM | POA: Insufficient documentation

## 2023-03-19 DIAGNOSIS — Z7901 Long term (current) use of anticoagulants: Secondary | ICD-10-CM | POA: Diagnosis not present

## 2023-03-23 ENCOUNTER — Other Ambulatory Visit: Payer: Self-pay | Admitting: Cardiovascular Disease

## 2023-03-25 ENCOUNTER — Other Ambulatory Visit: Payer: Self-pay | Admitting: Cardiovascular Disease

## 2023-03-26 DIAGNOSIS — I87333 Chronic venous hypertension (idiopathic) with ulcer and inflammation of bilateral lower extremity: Secondary | ICD-10-CM | POA: Diagnosis not present

## 2023-03-27 ENCOUNTER — Other Ambulatory Visit: Payer: Self-pay | Admitting: Nurse Practitioner

## 2023-03-28 NOTE — Telephone Encounter (Signed)
 This is a Educational psychologist pt

## 2023-03-28 NOTE — Telephone Encounter (Signed)
 Please contact pt for future appointment. Pt hasn't been seen by general cardiology since 2023. Pt needing refills.

## 2023-03-29 NOTE — Telephone Encounter (Signed)
 I call the pt and left a message to schedule an appointment for medication

## 2023-04-01 ENCOUNTER — Inpatient Hospital Stay: Payer: PPO | Attending: Oncology

## 2023-04-01 DIAGNOSIS — E538 Deficiency of other specified B group vitamins: Secondary | ICD-10-CM | POA: Diagnosis present

## 2023-04-01 MED ORDER — CYANOCOBALAMIN 1000 MCG/ML IJ SOLN
1000.0000 ug | INTRAMUSCULAR | Status: DC
  Administered 2023-04-01: 1000 ug via INTRAMUSCULAR
  Filled 2023-04-01: qty 1

## 2023-04-02 ENCOUNTER — Encounter: Payer: PPO | Admitting: Physician Assistant

## 2023-04-02 DIAGNOSIS — I87333 Chronic venous hypertension (idiopathic) with ulcer and inflammation of bilateral lower extremity: Secondary | ICD-10-CM | POA: Diagnosis not present

## 2023-04-03 ENCOUNTER — Ambulatory Visit: Payer: PPO | Attending: Cardiovascular Disease

## 2023-04-03 ENCOUNTER — Other Ambulatory Visit (INDEPENDENT_AMBULATORY_CARE_PROVIDER_SITE_OTHER): Payer: Self-pay | Admitting: Vascular Surgery

## 2023-04-03 DIAGNOSIS — Z7901 Long term (current) use of anticoagulants: Secondary | ICD-10-CM

## 2023-04-03 DIAGNOSIS — I4819 Other persistent atrial fibrillation: Secondary | ICD-10-CM | POA: Diagnosis not present

## 2023-04-03 DIAGNOSIS — I70213 Atherosclerosis of native arteries of extremities with intermittent claudication, bilateral legs: Secondary | ICD-10-CM

## 2023-04-03 LAB — POCT INR: INR: 1.5 — AB (ref 2.0–3.0)

## 2023-04-03 NOTE — Patient Instructions (Signed)
 TAKE 1 tablet today only then continue 0.5 TABLET DAILY.    Recheck INR in 3 weeks.  Be consistent with greens 3 per week.  Green beans do not contain Vitamin K   [phone]

## 2023-04-04 ENCOUNTER — Encounter (INDEPENDENT_AMBULATORY_CARE_PROVIDER_SITE_OTHER): Payer: PPO

## 2023-04-04 ENCOUNTER — Ambulatory Visit (INDEPENDENT_AMBULATORY_CARE_PROVIDER_SITE_OTHER): Payer: PPO | Admitting: Nurse Practitioner

## 2023-04-05 NOTE — Telephone Encounter (Signed)
 Called pt again and left a message to be seen for refills

## 2023-04-11 ENCOUNTER — Encounter: Payer: PPO | Attending: Physician Assistant | Admitting: Physician Assistant

## 2023-04-11 DIAGNOSIS — L97812 Non-pressure chronic ulcer of other part of right lower leg with fat layer exposed: Secondary | ICD-10-CM | POA: Insufficient documentation

## 2023-04-11 DIAGNOSIS — I480 Paroxysmal atrial fibrillation: Secondary | ICD-10-CM | POA: Diagnosis not present

## 2023-04-11 DIAGNOSIS — I2510 Atherosclerotic heart disease of native coronary artery without angina pectoris: Secondary | ICD-10-CM | POA: Insufficient documentation

## 2023-04-11 DIAGNOSIS — Z7901 Long term (current) use of anticoagulants: Secondary | ICD-10-CM | POA: Diagnosis not present

## 2023-04-11 DIAGNOSIS — L97822 Non-pressure chronic ulcer of other part of left lower leg with fat layer exposed: Secondary | ICD-10-CM | POA: Insufficient documentation

## 2023-04-11 DIAGNOSIS — I10 Essential (primary) hypertension: Secondary | ICD-10-CM | POA: Diagnosis not present

## 2023-04-11 DIAGNOSIS — I5042 Chronic combined systolic (congestive) and diastolic (congestive) heart failure: Secondary | ICD-10-CM | POA: Diagnosis not present

## 2023-04-11 DIAGNOSIS — L97312 Non-pressure chronic ulcer of right ankle with fat layer exposed: Secondary | ICD-10-CM | POA: Diagnosis not present

## 2023-04-11 DIAGNOSIS — E11621 Type 2 diabetes mellitus with foot ulcer: Secondary | ICD-10-CM | POA: Insufficient documentation

## 2023-04-11 DIAGNOSIS — I87333 Chronic venous hypertension (idiopathic) with ulcer and inflammation of bilateral lower extremity: Secondary | ICD-10-CM | POA: Insufficient documentation

## 2023-04-11 DIAGNOSIS — E11622 Type 2 diabetes mellitus with other skin ulcer: Secondary | ICD-10-CM | POA: Diagnosis present

## 2023-04-15 ENCOUNTER — Telehealth: Payer: Self-pay | Admitting: Internal Medicine

## 2023-04-15 NOTE — Telephone Encounter (Signed)
 Pt confirmed appt on 04/16/23

## 2023-04-16 ENCOUNTER — Ambulatory Visit: Payer: PPO | Attending: Internal Medicine | Admitting: Internal Medicine

## 2023-04-16 VITALS — BP 123/73 | HR 76 | Wt 166.4 lb

## 2023-04-16 DIAGNOSIS — Z955 Presence of coronary angioplasty implant and graft: Secondary | ICD-10-CM | POA: Diagnosis not present

## 2023-04-16 DIAGNOSIS — E785 Hyperlipidemia, unspecified: Secondary | ICD-10-CM | POA: Insufficient documentation

## 2023-04-16 DIAGNOSIS — I2510 Atherosclerotic heart disease of native coronary artery without angina pectoris: Secondary | ICD-10-CM | POA: Diagnosis not present

## 2023-04-16 DIAGNOSIS — N183 Chronic kidney disease, stage 3 unspecified: Secondary | ICD-10-CM

## 2023-04-16 DIAGNOSIS — N1832 Chronic kidney disease, stage 3b: Secondary | ICD-10-CM | POA: Diagnosis not present

## 2023-04-16 DIAGNOSIS — I4819 Other persistent atrial fibrillation: Secondary | ICD-10-CM

## 2023-04-16 DIAGNOSIS — I081 Rheumatic disorders of both mitral and tricuspid valves: Secondary | ICD-10-CM | POA: Insufficient documentation

## 2023-04-16 DIAGNOSIS — I428 Other cardiomyopathies: Secondary | ICD-10-CM | POA: Insufficient documentation

## 2023-04-16 DIAGNOSIS — I3139 Other pericardial effusion (noninflammatory): Secondary | ICD-10-CM | POA: Diagnosis not present

## 2023-04-16 DIAGNOSIS — I255 Ischemic cardiomyopathy: Secondary | ICD-10-CM | POA: Insufficient documentation

## 2023-04-16 DIAGNOSIS — Z7901 Long term (current) use of anticoagulants: Secondary | ICD-10-CM | POA: Insufficient documentation

## 2023-04-16 DIAGNOSIS — E1122 Type 2 diabetes mellitus with diabetic chronic kidney disease: Secondary | ICD-10-CM | POA: Diagnosis not present

## 2023-04-16 DIAGNOSIS — Z7984 Long term (current) use of oral hypoglycemic drugs: Secondary | ICD-10-CM | POA: Insufficient documentation

## 2023-04-16 DIAGNOSIS — I130 Hypertensive heart and chronic kidney disease with heart failure and stage 1 through stage 4 chronic kidney disease, or unspecified chronic kidney disease: Secondary | ICD-10-CM | POA: Diagnosis not present

## 2023-04-16 DIAGNOSIS — I10 Essential (primary) hypertension: Secondary | ICD-10-CM

## 2023-04-16 DIAGNOSIS — Z79899 Other long term (current) drug therapy: Secondary | ICD-10-CM | POA: Diagnosis not present

## 2023-04-16 DIAGNOSIS — I5022 Chronic systolic (congestive) heart failure: Secondary | ICD-10-CM

## 2023-04-16 DIAGNOSIS — Z85038 Personal history of other malignant neoplasm of large intestine: Secondary | ICD-10-CM | POA: Diagnosis not present

## 2023-04-16 NOTE — Progress Notes (Signed)
 [facility] NOTE  Referring Physician: Ward Givens, NP Primary Care: Glori Luis, MD (Inactive) Primary Cardiologist: Lorine Bears, MD   HPI:  [name] is a 78 year old male with a history of CAD status post circumflex stenting, chronic HFrEF, mixed ischemic and nonischemic cardiomyopathy, hypertension, hyperlipidemia, stage III chronic kidney disease, and colon cancer, who presents for heart failure follow-up.   Found to have colon CA in 2015. Underwent colectomy by chemotherapy with oxaliplatin and fluorouracil.   In January 2017, he was diagnosed with systolic heart failure. Echo EF 20-25% with moderate MR/TR, and moderate to severe pulmonary hypertension.  Right and left heart cardiac catheterization showed moderate to severe pulmonary hypertension with severely elevated filling pressures and severely reduced cardiac output.  Coronary angiography showed severe one-vessel CAD with an 80 to 90% stenosis in the proximal left circumflex, and mild diffuse disease involving the LAD and RCA.  He underwent PCI of the left circumflex in May 2017.  Follow-up echo in November 2020, showed an EF of 30 to 35%.  In the setting of PVCs, outpatient monitoring was performed and showed a 6% PVC burden, which improved with resumption of carvedilol.    RHC 1/17  RA = 9  PA = 70/24 (40) PCWP = 30  (v = 35) Fick CO/CI = 3.3/1.3 PVR = 3.0 WU  Echo in April 2021 showed persistent LV dysfunction EF 25-30%, severe PAH, mild to moderate MR.  He was offered EP referral for ICD therapy however, he declined. In April 2023, he required admission secondary to massive volume overload and weight gain despite titration of outpatient diuretics.  Echo 4/23  EF 20-25% with severely dilated left ventricle, severely reduced RV function, severe biatrial lodgment, severe MR/TR, circumferential pericardial effusion, and trivial AI. Discharged home at 151 pounds.    He has previously declined EP referral for  ICD. He was unable to afford Entresto but was willing to start losartan  Here for f/u. Says he has had a lot of swelling and wounds in his legs and has been followed by [facility] and now almost back to normal. Denies CP or SOB. Says he can do ADLs as long as he takes his time. No falls. Not clear on his meds.   FHx:  Mother died when was 62 months old Unsure how his Dad passed 2 half brothers - he doesn't know them Doesn't know much about his family 4 kids - 30 grandkids    Past Medical History:  Diagnosis Date   Adenocarcinoma of colon (HCC) 06/19/2013   a. moderately differentiated stage IIIc (T4bN2a M0) adenocarcinoma of colon  s/p colectomy followed by chemoRx with oxaliplatin & fluorouacil.   Anemia    CAD (coronary artery disease)    a. 02/2015 Cath: LM nl, LAD 50/40 mid/distal, LCX 80p, RCA 40p, 30d, RPL1 40, CO 3.27, CI 1.65; b. 06/09/15 PCI: LM minor irregs, m-dLAD 50%, dLAD 40%, pLCx 80% (s/p PCI/DES 0%), pRCA 40%, dRCA 30%, 1st RPLB 40%   Chronic kidney disease, stage 3a (HCC)    Chronic systolic CHF (congestive heart failure) (HCC)    a. 02/2015 Echo: EF 20-25%, diff HK, mod MR, mildy dil LA, mildly dil RV w/ mod RV syst dysfxn, mildly dil RA, mod TR, PASP ; b. 09/2017 Echo: EF 40-45%, diff HK. Gr1 DD. Mild to mod MR. Mildly to mod dil LA; c. 09/2019 Echo: EF 25-30%, glob HK, mod reduced RV fxn, sev elev PASP. Mod dil LA. Mild-mod MR; d.  05/2021 Echo: EF 20-25%, glob HK, sev red RV fxn, sev BAE, sev MR/TR/TS, triv AI.   Diabetes mellitus without complication (HCC)    Essential hypertension    Hypertensive heart disease    Left inguinal hernia 05/2020   Mixed Ischemic and Non-ischemic Cardiomyopathy    a. 02/2015 EF 20-25%, diff HK; b. 09/2016 EF 40-45%; c. 09/2019 EF 25-30%; d. 05/2021 Echo: EF 20-25%, glob HK.   Persistent atrial fibrillation (HCC)    a. Dx 05/2021-->CHA2DS2VASc = 5-->eliquis.   Pulmonary hypertension (HCC)    Severe mitral regurgitation    a. 09/2017  Echo: Mild to mod MR; b. 09/2019 Echo: mild-mod MR; c. 05/2021 Echo: Sev MR/TR/TS.   Tubular adenoma of colon     Current Outpatient Medications  Medication Sig Dispense Refill   amLODipine (NORVASC) 5 MG tablet Take 5 mg by mouth daily.     aspirin 81 MG chewable tablet Chew by mouth daily.     atorvastatin (LIPITOR) 40 MG tablet TAKE 1 TABLET(40 MG) BY MOUTH DAILY 90 tablet 3   cholecalciferol (VITAMIN D3) 25 MCG (1000 UNIT) tablet Take 1,000 Units by mouth daily.     loratadine (CLARITIN) 10 MG tablet TAKE 1 TABLET(10 MG) BY MOUTH DAILY 30 tablet 11   metFORMIN (GLUCOPHAGE) 500 MG tablet Take 500 mg by mouth 2 (two) times daily with a meal.     spironolactone (ALDACTONE) 25 MG tablet TAKE 1 TABLET(25 MG) BY MOUTH DAILY 30 tablet 5   Blood Glucose Monitoring Suppl DEVI 1 each by Does not apply route daily. May substitute to any manufacturer covered by patient's insurance. 1 each 0   carvedilol (COREG) 3.125 MG tablet TAKE 1 TABLET BY MOUTH 2 TIMES DAILY WITH A MEAL 180 tablet 2   clopidogrel (PLAVIX) 75 MG tablet Take 75 mg by mouth daily.     FARXIGA 10 MG TABS tablet TAKE 1 TABLET(10 MG) BY MOUTH DAILY BEFORE BREAKFAST 30 tablet 6   ferrous sulfate 325 (65 FE) MG tablet Take 325 mg by mouth daily with breakfast.     gabapentin (NEURONTIN) 100 MG capsule Take 100 mg by mouth 3 (three) times daily. Taking 2 -100 mg pills in am and 1 -100 mg at night (Patient not taking: Reported on 08/21/2022)     Glucose Blood (BLOOD GLUCOSE TEST STRIPS) STRP 1 each by In Vitro route daily as needed. May substitute to any manufacturer covered by patient's insurance. 100 strip 0   Lancet Device MISC 1 each by Does not apply route daily as needed. May substitute to any manufacturer covered by patient's insurance. 1 each 0   Lancets Misc. MISC 1 each by Does not apply route daily as needed. May substitute to any manufacturer covered by patient's insurance. 100 each 0   losartan (COZAAR) 100 MG tablet Take 1 tablet  (100 mg total) by mouth daily. 30 tablet 6   Magnesium Oxide 400 MG CAPS Take 1 capsule (400 mg total) by mouth daily. 90 capsule 3   potassium chloride SA (KLOR-CON M) 20 MEQ tablet TAKE 2 TABLETS(40 MEQ) BY MOUTH DAILY 60 tablet 6   torsemide (DEMADEX) 20 MG tablet Take 1 tablet (20 mg total) by mouth daily. 30 tablet 11   warfarin (COUMADIN) 5 MG tablet TAKE 1/2 TO 1 TABLET BY MOUTH EVERY DAY AS DIRECTED BY COUMADIN CLINIC 70 tablet 1   No current facility-administered medications for this visit.    Allergies  Allergen Reactions   Shellfish Allergy Other (See  Comments) and Hives    Pt. instructed by MD to avoid seafood      Social History   Socioeconomic History   Marital status: Married    Spouse name: Carney Bern   Number of children: 4   Years of education: Not on file   Highest education level: Not on file  Occupational History   Occupation: retired  Tobacco Use   Smoking status: Never   Smokeless tobacco: Never  Vaping Use   Vaping status: Never Used  Substance and Sexual Activity   Alcohol use: Not Currently    Comment: none in many years   Drug use: No   Sexual activity: Yes  Other Topics Concern   Not on file  Social History Narrative   Not on file   Social Drivers of Health   Financial Resource Strain: Low Risk  (10/17/2022)   Overall Financial Resource Strain (CARDIA)    Difficulty of Paying Living Expenses: Not hard at all  Food Insecurity: No Food Insecurity (10/17/2022)   Hunger Vital Sign    Worried About Running Out of Food in the Last Year: Never true    Ran Out of Food in the Last Year: Never true  Transportation Needs: No Transportation Needs (10/17/2022)   PRAPARE - Administrator, Civil Service (Medical): No    Lack of Transportation (Non-Medical): No  Physical Activity: Sufficiently Active (10/17/2022)   Exercise Vital Sign    Days of Exercise per Week: 4 days    Minutes of Exercise per Session: 60 min  Stress: No Stress Concern  Present (10/17/2022)   [employer] of Occupational Health - Occupational Stress Questionnaire    Feeling of Stress : Not at all  Social Connections: Moderately Isolated (10/17/2022)   Social Connection and Isolation Panel [NHANES]    Frequency of Communication with Friends and Family: Never    Frequency of Social Gatherings with Friends and Family: Once a week    Attends Religious Services: More than 4 times per year    Active Member of [employer] or Organizations: No    Attends Banker Meetings: Never    Marital Status: Married  Catering manager Violence: Not At Risk (10/17/2022)   Humiliation, Afraid, Rape, and Kick questionnaire    Fear of Current or Ex-Partner: No    Emotionally Abused: No    Physically Abused: No    Sexually Abused: No      Family History  Problem Relation Age of Onset   Cancer Mother    Other Father        unknown medical history    Vitals:   04/16/23 1456 04/16/23 1457  BP: 123/73   Pulse: 74 76  SpO2: 94% 95%  Weight: 166 lb 6.4 oz (75.5 kg)    Wt Readings from Last 3 Encounters:  04/16/23 166 lb 6.4 oz (75.5 kg)  01/23/23 167 lb 9.6 oz (76 kg)  12/13/22 164 lb (74.4 kg)    PHYSICAL EXAM: General: Elderly. Frail . No resp difficulty HEENT: normal Neck: supple. JVP 6-7 Carotids 2+ bilat; no bruits. No lymphadenopathy or thryomegaly appreciated. Cor: PMI nondisplaced. Irregular rate & rhythm. No rubs, gallops or murmurs. Lungs: clear Abdomen: soft, nontender, nondistended. No hepatosplenomegaly. No bruits or masses. Good bowel sounds. Extremities: no cyanosis, clubbing, rash, + UNNA boots 1+ edema into thighs Neuro: alert & orientedx3, cranial nerves grossly intact. moves all 4 extremities w/o difficulty. Affect pleasant   AF 64 LVH with strain Personally reviewed  ASSESSMENT & PLAN:   1.  Chronic systolic HF due to mixed NICM/ICM - primarily NICM - Onset 1/17. Echo EF 20-25% with moderate MR/TR - Cath 1/17: severe 1v CAD with  80-90% stenosis in the proximal left circumflex -> PCI, and mild diffuse disease inLAD and RCA.  - Echo 11/20 EF 30-35%.   - Monitor with 6% PVC burden,  - Echo 4/23 EF 20-25% with severely dilated left ventricle, severely reduced RV function, severe biatrial lodgment, severe MR/TR, circumferential pericardial effusion, and trivial AI.   - cMRI 3/24 LVEF 24% the trabeculations but doesn't meet criteria for LVNC. [place] LGE (noischemic) RVEF 24% - Stable NYHA II-III. Limited mobility - Volume status recently markedly elevated but now improving per his report. Currently on torsemide 20 daily. Says he is diuresing well. We will not adjust today - Continue losartan 100 daily (again refused Entresto due to cost)  - Continue carvedilol 3.125 bid - Continue spiro 25 daily - Continue Farxiga 10  - Not interested in ICD  - Labs today - Repeat echo at next visit.   2.  Persistent atrial fibrillation:  - Back in AF. Rate controlled. Toelrating well  - On warfarin because Eliquis was cost prohibitive.   3.  Coronary artery disease:  -  Status post circumflex stenting in 2017. -  No aspirin in the setting of warfarin. -  No s/s angina   4.  Essential hypertension:  - Blood pressure well controlled. Continue current regimen   5.  Stage IIIB chronic kidney disease:  - Scr 1.5-1.7 - Continue ARB/SGLT2i - Labs today  Arvilla Meres, MD  3:11 PM

## 2023-04-16 NOTE — Patient Instructions (Signed)
 Great to see you today!!!  Medication Changes:  None, continue current medications  Lab Work:  Go DOWN to LOWER LEVEL (LL) to have your blood work completed inside of [employer] office.  We will only call you if the results are abnormal or if the provider would like to make medication changes.   Testing/Procedures:  Your physician has requested that you have an echocardiogram. Echocardiography is a painless test that uses sound waves to create images of your heart. It provides your doctor with information about the size and shape of your heart and how well your heart's chambers and valves are working. This procedure takes approximately one hour. There are no restrictions for this procedure. Please do NOT wear cologne, perfume, aftershave, or lotions (deodorant is allowed). Please arrive 15 minutes prior to your appointment time.  IN 6 MONTHS: Please have your echo completed. You will check in for this at the [facility]. You have to arrive 15 MINS EARLY for preparation, otherwise you will have to reschedule.  Please note: We ask at that you not bring children with you during ultrasound (echo/ vascular) testing. Due to room size and safety concerns, children are not allowed in the ultrasound rooms during exams. Our front office staff cannot provide observation of children in our lobby area while testing is being conducted. An adult accompanying a patient to their appointment will only be allowed in the ultrasound room at the discretion of the ultrasound technician under special circumstances. We apologize for any inconvenience.  Special Instructions // Education:  Do the following things EVERYDAY: Weigh yourself in the morning before breakfast. Write it down and keep it in a log. Take your medicines as prescribed Eat low salt foods--Limit salt (sodium) to 2000 mg per day.  Stay as active as you can everyday Limit all fluids for the day to less than 2 liters   Follow-Up in: 6 months  with an echocardiogram (Sept), **We will call you closer to this time to schedule   If you have any questions or concerns before your next appointment please send [place] a message through [place] or call our office at [phone], If it is after hours your call will be answered by our answering service.   At the [facility], you and your health needs are our priority. We have a designated team specialized in the treatment of Heart Failure. This Care Team includes your primary Heart Failure Specialized Cardiologist (physician), Advanced Practice Providers (APPs- Physician Assistants and Nurse Practitioners), and Pharmacist who all work together to provide you with the care you need, when you need it.   You may see any of the following providers on your designated Care Team at your next follow up:  Dr. Arvilla Meres Dr. Marca Ancona Dr. Dorthula Nettles Dr. Theresia Bough Tonye Becket, NP Robbie Lis, [place] [address] [place], [place] Brynda Peon, NP [place] Lee, NP Clarisa Kindred, NP Enos Fling, PharmD

## 2023-04-17 ENCOUNTER — Encounter: Admitting: Physician Assistant

## 2023-04-17 DIAGNOSIS — E11622 Type 2 diabetes mellitus with other skin ulcer: Secondary | ICD-10-CM | POA: Diagnosis not present

## 2023-04-18 LAB — BASIC METABOLIC PANEL
BUN/Creatinine Ratio: 16 (ref 10–24)
BUN: 27 mg/dL (ref 8–27)
CO2: 24 mmol/L (ref 20–29)
Calcium: 8.6 mg/dL (ref 8.6–10.2)
Chloride: 103 mmol/L (ref 96–106)
Creatinine, Ser: 1.74 mg/dL — ABNORMAL HIGH (ref 0.76–1.27)
Glucose: 200 mg/dL — ABNORMAL HIGH (ref 70–99)
Potassium: 3.4 mmol/L — ABNORMAL LOW (ref 3.5–5.2)
Sodium: 143 mmol/L (ref 134–144)
eGFR: 40 mL/min/{1.73_m2} — ABNORMAL LOW (ref >59)

## 2023-04-18 LAB — BRAIN NATRIURETIC PEPTIDE: BNP: 1510.6 pg/mL — ABNORMAL HIGH (ref 0.0–100.0)

## 2023-04-22 ENCOUNTER — Ambulatory Visit: Payer: PPO | Admitting: Family Medicine

## 2023-04-23 ENCOUNTER — Encounter

## 2023-04-23 DIAGNOSIS — E11622 Type 2 diabetes mellitus with other skin ulcer: Secondary | ICD-10-CM | POA: Diagnosis not present

## 2023-04-24 ENCOUNTER — Ambulatory Visit: Payer: PPO | Attending: Cardiovascular Disease

## 2023-04-24 DIAGNOSIS — Z7901 Long term (current) use of anticoagulants: Secondary | ICD-10-CM | POA: Diagnosis not present

## 2023-04-24 DIAGNOSIS — I4819 Other persistent atrial fibrillation: Secondary | ICD-10-CM

## 2023-04-24 LAB — POCT INR: INR: 1.7 — AB (ref 2.0–3.0)

## 2023-04-24 NOTE — Patient Instructions (Signed)
 TAKE 1 tablet today and Thursday only then continue 0.5 TABLET DAILY.    Recheck INR in 3 weeks.  Be consistent with greens 2 per week.  Green beans do not contain Vitamin K   [phone]

## 2023-04-29 ENCOUNTER — Inpatient Hospital Stay: Payer: PPO | Attending: Oncology

## 2023-04-29 DIAGNOSIS — I2510 Atherosclerotic heart disease of native coronary artery without angina pectoris: Secondary | ICD-10-CM | POA: Insufficient documentation

## 2023-04-29 DIAGNOSIS — E11622 Type 2 diabetes mellitus with other skin ulcer: Secondary | ICD-10-CM | POA: Insufficient documentation

## 2023-04-29 DIAGNOSIS — I480 Paroxysmal atrial fibrillation: Secondary | ICD-10-CM | POA: Insufficient documentation

## 2023-04-29 DIAGNOSIS — E538 Deficiency of other specified B group vitamins: Secondary | ICD-10-CM | POA: Diagnosis present

## 2023-04-29 DIAGNOSIS — L97812 Non-pressure chronic ulcer of other part of right lower leg with fat layer exposed: Secondary | ICD-10-CM | POA: Insufficient documentation

## 2023-04-29 DIAGNOSIS — I083 Combined rheumatic disorders of mitral, aortic and tricuspid valves: Secondary | ICD-10-CM | POA: Insufficient documentation

## 2023-04-29 DIAGNOSIS — I3139 Other pericardial effusion (noninflammatory): Secondary | ICD-10-CM | POA: Insufficient documentation

## 2023-04-29 DIAGNOSIS — Z7901 Long term (current) use of anticoagulants: Secondary | ICD-10-CM | POA: Insufficient documentation

## 2023-04-29 DIAGNOSIS — I890 Lymphedema, not elsewhere classified: Secondary | ICD-10-CM | POA: Insufficient documentation

## 2023-04-29 DIAGNOSIS — I87333 Chronic venous hypertension (idiopathic) with ulcer and inflammation of bilateral lower extremity: Secondary | ICD-10-CM | POA: Insufficient documentation

## 2023-04-29 DIAGNOSIS — I5042 Chronic combined systolic (congestive) and diastolic (congestive) heart failure: Secondary | ICD-10-CM | POA: Insufficient documentation

## 2023-04-29 DIAGNOSIS — L97822 Non-pressure chronic ulcer of other part of left lower leg with fat layer exposed: Secondary | ICD-10-CM | POA: Insufficient documentation

## 2023-04-29 DIAGNOSIS — I110 Hypertensive heart disease with heart failure: Secondary | ICD-10-CM | POA: Insufficient documentation

## 2023-04-29 MED ORDER — CYANOCOBALAMIN 1000 MCG/ML IJ SOLN
1000.0000 ug | INTRAMUSCULAR | Status: DC
  Administered 2023-04-29: 1000 ug via INTRAMUSCULAR
  Filled 2023-04-29: qty 1

## 2023-04-30 ENCOUNTER — Encounter: Payer: PPO | Admitting: Family Medicine

## 2023-04-30 ENCOUNTER — Encounter: Admitting: Physician Assistant

## 2023-04-30 DIAGNOSIS — R7989 Other specified abnormal findings of blood chemistry: Secondary | ICD-10-CM | POA: Diagnosis not present

## 2023-04-30 DIAGNOSIS — A4101 Sepsis due to Methicillin susceptible Staphylococcus aureus: Secondary | ICD-10-CM | POA: Diagnosis not present

## 2023-05-03 ENCOUNTER — Inpatient Hospital Stay
Admission: EM | Admit: 2023-05-03 | Discharge: 2023-06-06 | DRG: 871 | Disposition: E | Attending: Internal Medicine | Admitting: Internal Medicine

## 2023-05-03 ENCOUNTER — Other Ambulatory Visit: Payer: Self-pay

## 2023-05-03 ENCOUNTER — Inpatient Hospital Stay

## 2023-05-03 ENCOUNTER — Emergency Department

## 2023-05-03 DIAGNOSIS — C189 Malignant neoplasm of colon, unspecified: Secondary | ICD-10-CM | POA: Diagnosis present

## 2023-05-03 DIAGNOSIS — N189 Chronic kidney disease, unspecified: Secondary | ICD-10-CM | POA: Diagnosis not present

## 2023-05-03 DIAGNOSIS — Z9861 Coronary angioplasty status: Secondary | ICD-10-CM | POA: Diagnosis not present

## 2023-05-03 DIAGNOSIS — J9602 Acute respiratory failure with hypercapnia: Secondary | ICD-10-CM | POA: Diagnosis present

## 2023-05-03 DIAGNOSIS — I081 Rheumatic disorders of both mitral and tricuspid valves: Secondary | ICD-10-CM | POA: Diagnosis present

## 2023-05-03 DIAGNOSIS — M4802 Spinal stenosis, cervical region: Secondary | ICD-10-CM | POA: Diagnosis present

## 2023-05-03 DIAGNOSIS — J9601 Acute respiratory failure with hypoxia: Secondary | ICD-10-CM | POA: Diagnosis present

## 2023-05-03 DIAGNOSIS — I428 Other cardiomyopathies: Secondary | ICD-10-CM | POA: Diagnosis present

## 2023-05-03 DIAGNOSIS — M5030 Other cervical disc degeneration, unspecified cervical region: Secondary | ICD-10-CM | POA: Diagnosis present

## 2023-05-03 DIAGNOSIS — J189 Pneumonia, unspecified organism: Secondary | ICD-10-CM | POA: Diagnosis present

## 2023-05-03 DIAGNOSIS — I4901 Ventricular fibrillation: Secondary | ICD-10-CM | POA: Diagnosis present

## 2023-05-03 DIAGNOSIS — T796XXA Traumatic ischemia of muscle, initial encounter: Secondary | ICD-10-CM | POA: Diagnosis present

## 2023-05-03 DIAGNOSIS — Z79899 Other long term (current) drug therapy: Secondary | ICD-10-CM

## 2023-05-03 DIAGNOSIS — I4819 Other persistent atrial fibrillation: Secondary | ICD-10-CM | POA: Diagnosis present

## 2023-05-03 DIAGNOSIS — E43 Unspecified severe protein-calorie malnutrition: Secondary | ICD-10-CM | POA: Diagnosis present

## 2023-05-03 DIAGNOSIS — I2119 ST elevation (STEMI) myocardial infarction involving other coronary artery of inferior wall: Secondary | ICD-10-CM | POA: Diagnosis present

## 2023-05-03 DIAGNOSIS — I130 Hypertensive heart and chronic kidney disease with heart failure and stage 1 through stage 4 chronic kidney disease, or unspecified chronic kidney disease: Secondary | ICD-10-CM | POA: Diagnosis present

## 2023-05-03 DIAGNOSIS — I5022 Chronic systolic (congestive) heart failure: Secondary | ICD-10-CM | POA: Diagnosis present

## 2023-05-03 DIAGNOSIS — R570 Cardiogenic shock: Secondary | ICD-10-CM | POA: Diagnosis not present

## 2023-05-03 DIAGNOSIS — Z1152 Encounter for screening for COVID-19: Secondary | ICD-10-CM | POA: Diagnosis not present

## 2023-05-03 DIAGNOSIS — E1122 Type 2 diabetes mellitus with diabetic chronic kidney disease: Secondary | ICD-10-CM | POA: Diagnosis present

## 2023-05-03 DIAGNOSIS — I272 Pulmonary hypertension, unspecified: Secondary | ICD-10-CM | POA: Diagnosis present

## 2023-05-03 DIAGNOSIS — R7401 Elevation of levels of liver transaminase levels: Secondary | ICD-10-CM | POA: Diagnosis present

## 2023-05-03 DIAGNOSIS — N1832 Chronic kidney disease, stage 3b: Secondary | ICD-10-CM | POA: Diagnosis present

## 2023-05-03 DIAGNOSIS — I462 Cardiac arrest due to underlying cardiac condition: Secondary | ICD-10-CM | POA: Diagnosis present

## 2023-05-03 DIAGNOSIS — A4101 Sepsis due to Methicillin susceptible Staphylococcus aureus: Principal | ICD-10-CM | POA: Diagnosis present

## 2023-05-03 DIAGNOSIS — Z955 Presence of coronary angioplasty implant and graft: Secondary | ICD-10-CM

## 2023-05-03 DIAGNOSIS — E785 Hyperlipidemia, unspecified: Secondary | ICD-10-CM | POA: Diagnosis present

## 2023-05-03 DIAGNOSIS — R791 Abnormal coagulation profile: Secondary | ICD-10-CM

## 2023-05-03 DIAGNOSIS — G061 Intraspinal abscess and granuloma: Secondary | ICD-10-CM | POA: Diagnosis present

## 2023-05-03 DIAGNOSIS — S199XXA Unspecified injury of neck, initial encounter: Secondary | ICD-10-CM

## 2023-05-03 DIAGNOSIS — I50813 Acute on chronic right heart failure: Secondary | ICD-10-CM | POA: Diagnosis not present

## 2023-05-03 DIAGNOSIS — R937 Abnormal findings on diagnostic imaging of other parts of musculoskeletal system: Secondary | ICD-10-CM

## 2023-05-03 DIAGNOSIS — Z7902 Long term (current) use of antithrombotics/antiplatelets: Secondary | ICD-10-CM

## 2023-05-03 DIAGNOSIS — Y92009 Unspecified place in unspecified non-institutional (private) residence as the place of occurrence of the external cause: Secondary | ICD-10-CM

## 2023-05-03 DIAGNOSIS — I340 Nonrheumatic mitral (valve) insufficiency: Secondary | ICD-10-CM | POA: Diagnosis not present

## 2023-05-03 DIAGNOSIS — J9811 Atelectasis: Secondary | ICD-10-CM | POA: Diagnosis present

## 2023-05-03 DIAGNOSIS — W06XXXA Fall from bed, initial encounter: Secondary | ICD-10-CM

## 2023-05-03 DIAGNOSIS — R579 Shock, unspecified: Secondary | ICD-10-CM | POA: Diagnosis not present

## 2023-05-03 DIAGNOSIS — I472 Ventricular tachycardia, unspecified: Secondary | ICD-10-CM | POA: Diagnosis present

## 2023-05-03 DIAGNOSIS — Z7982 Long term (current) use of aspirin: Secondary | ICD-10-CM

## 2023-05-03 DIAGNOSIS — Z7901 Long term (current) use of anticoagulants: Secondary | ICD-10-CM

## 2023-05-03 DIAGNOSIS — D689 Coagulation defect, unspecified: Secondary | ICD-10-CM | POA: Diagnosis present

## 2023-05-03 DIAGNOSIS — L97529 Non-pressure chronic ulcer of other part of left foot with unspecified severity: Secondary | ICD-10-CM | POA: Diagnosis present

## 2023-05-03 DIAGNOSIS — I959 Hypotension, unspecified: Secondary | ICD-10-CM | POA: Diagnosis not present

## 2023-05-03 DIAGNOSIS — I4891 Unspecified atrial fibrillation: Secondary | ICD-10-CM | POA: Diagnosis present

## 2023-05-03 DIAGNOSIS — R6521 Severe sepsis with septic shock: Secondary | ICD-10-CM | POA: Diagnosis present

## 2023-05-03 DIAGNOSIS — E11621 Type 2 diabetes mellitus with foot ulcer: Secondary | ICD-10-CM | POA: Diagnosis present

## 2023-05-03 DIAGNOSIS — G9341 Metabolic encephalopathy: Secondary | ICD-10-CM | POA: Diagnosis present

## 2023-05-03 DIAGNOSIS — I071 Rheumatic tricuspid insufficiency: Secondary | ICD-10-CM | POA: Diagnosis present

## 2023-05-03 DIAGNOSIS — R6520 Severe sepsis without septic shock: Secondary | ICD-10-CM | POA: Diagnosis present

## 2023-05-03 DIAGNOSIS — I5043 Acute on chronic combined systolic (congestive) and diastolic (congestive) heart failure: Secondary | ICD-10-CM | POA: Diagnosis not present

## 2023-05-03 DIAGNOSIS — K8020 Calculus of gallbladder without cholecystitis without obstruction: Secondary | ICD-10-CM | POA: Diagnosis present

## 2023-05-03 DIAGNOSIS — I5084 End stage heart failure: Secondary | ICD-10-CM | POA: Diagnosis present

## 2023-05-03 DIAGNOSIS — D631 Anemia in chronic kidney disease: Secondary | ICD-10-CM | POA: Diagnosis present

## 2023-05-03 DIAGNOSIS — E8809 Other disorders of plasma-protein metabolism, not elsewhere classified: Secondary | ICD-10-CM | POA: Diagnosis present

## 2023-05-03 DIAGNOSIS — T451X5A Adverse effect of antineoplastic and immunosuppressive drugs, initial encounter: Secondary | ICD-10-CM | POA: Diagnosis present

## 2023-05-03 DIAGNOSIS — I2510 Atherosclerotic heart disease of native coronary artery without angina pectoris: Secondary | ICD-10-CM | POA: Diagnosis not present

## 2023-05-03 DIAGNOSIS — D696 Thrombocytopenia, unspecified: Secondary | ICD-10-CM | POA: Diagnosis present

## 2023-05-03 DIAGNOSIS — I38 Endocarditis, valve unspecified: Secondary | ICD-10-CM | POA: Insufficient documentation

## 2023-05-03 DIAGNOSIS — I119 Hypertensive heart disease without heart failure: Secondary | ICD-10-CM | POA: Diagnosis not present

## 2023-05-03 DIAGNOSIS — Z85038 Personal history of other malignant neoplasm of large intestine: Secondary | ICD-10-CM

## 2023-05-03 DIAGNOSIS — B9561 Methicillin susceptible Staphylococcus aureus infection as the cause of diseases classified elsewhere: Secondary | ICD-10-CM | POA: Diagnosis not present

## 2023-05-03 DIAGNOSIS — Z515 Encounter for palliative care: Secondary | ICD-10-CM | POA: Diagnosis not present

## 2023-05-03 DIAGNOSIS — I361 Nonrheumatic tricuspid (valve) insufficiency: Secondary | ICD-10-CM | POA: Diagnosis not present

## 2023-05-03 DIAGNOSIS — I214 Non-ST elevation (NSTEMI) myocardial infarction: Secondary | ICD-10-CM | POA: Diagnosis present

## 2023-05-03 DIAGNOSIS — Z7189 Other specified counseling: Secondary | ICD-10-CM | POA: Diagnosis not present

## 2023-05-03 DIAGNOSIS — M6282 Rhabdomyolysis: Secondary | ICD-10-CM

## 2023-05-03 DIAGNOSIS — I469 Cardiac arrest, cause unspecified: Secondary | ICD-10-CM | POA: Diagnosis not present

## 2023-05-03 DIAGNOSIS — R54 Age-related physical debility: Secondary | ICD-10-CM | POA: Diagnosis present

## 2023-05-03 DIAGNOSIS — G620 Drug-induced polyneuropathy: Secondary | ICD-10-CM | POA: Diagnosis present

## 2023-05-03 DIAGNOSIS — I4892 Unspecified atrial flutter: Secondary | ICD-10-CM | POA: Diagnosis not present

## 2023-05-03 DIAGNOSIS — Z7984 Long term (current) use of oral hypoglycemic drugs: Secondary | ICD-10-CM

## 2023-05-03 DIAGNOSIS — R7881 Bacteremia: Secondary | ICD-10-CM | POA: Diagnosis not present

## 2023-05-03 DIAGNOSIS — E1165 Type 2 diabetes mellitus with hyperglycemia: Secondary | ICD-10-CM | POA: Diagnosis present

## 2023-05-03 DIAGNOSIS — I480 Paroxysmal atrial fibrillation: Secondary | ICD-10-CM | POA: Diagnosis not present

## 2023-05-03 DIAGNOSIS — R578 Other shock: Secondary | ICD-10-CM | POA: Diagnosis present

## 2023-05-03 DIAGNOSIS — Z66 Do not resuscitate: Secondary | ICD-10-CM | POA: Diagnosis not present

## 2023-05-03 DIAGNOSIS — M5090 Cervical disc disorder, unspecified, unspecified cervical region: Secondary | ICD-10-CM | POA: Diagnosis not present

## 2023-05-03 DIAGNOSIS — Z794 Long term (current) use of insulin: Secondary | ICD-10-CM

## 2023-05-03 DIAGNOSIS — N179 Acute kidney failure, unspecified: Secondary | ICD-10-CM | POA: Diagnosis present

## 2023-05-03 DIAGNOSIS — E872 Acidosis, unspecified: Secondary | ICD-10-CM | POA: Diagnosis present

## 2023-05-03 DIAGNOSIS — G8929 Other chronic pain: Secondary | ICD-10-CM | POA: Diagnosis present

## 2023-05-03 DIAGNOSIS — I5082 Biventricular heart failure: Secondary | ICD-10-CM | POA: Diagnosis present

## 2023-05-03 DIAGNOSIS — I5021 Acute systolic (congestive) heart failure: Secondary | ICD-10-CM | POA: Diagnosis not present

## 2023-05-03 DIAGNOSIS — I5042 Chronic combined systolic (congestive) and diastolic (congestive) heart failure: Secondary | ICD-10-CM | POA: Diagnosis not present

## 2023-05-03 DIAGNOSIS — A419 Sepsis, unspecified organism: Principal | ICD-10-CM

## 2023-05-03 DIAGNOSIS — R7989 Other specified abnormal findings of blood chemistry: Secondary | ICD-10-CM | POA: Diagnosis present

## 2023-05-03 DIAGNOSIS — Z860101 Personal history of adenomatous and serrated colon polyps: Secondary | ICD-10-CM

## 2023-05-03 DIAGNOSIS — Z91013 Allergy to seafood: Secondary | ICD-10-CM

## 2023-05-03 DIAGNOSIS — I5023 Acute on chronic systolic (congestive) heart failure: Secondary | ICD-10-CM | POA: Diagnosis not present

## 2023-05-03 DIAGNOSIS — I255 Ischemic cardiomyopathy: Secondary | ICD-10-CM | POA: Diagnosis present

## 2023-05-03 DIAGNOSIS — R55 Syncope and collapse: Secondary | ICD-10-CM

## 2023-05-03 DIAGNOSIS — Z6824 Body mass index (BMI) 24.0-24.9, adult: Secondary | ICD-10-CM

## 2023-05-03 DIAGNOSIS — Z9049 Acquired absence of other specified parts of digestive tract: Secondary | ICD-10-CM

## 2023-05-03 DIAGNOSIS — E878 Other disorders of electrolyte and fluid balance, not elsewhere classified: Secondary | ICD-10-CM | POA: Diagnosis present

## 2023-05-03 LAB — COMPREHENSIVE METABOLIC PANEL WITH GFR
ALT: 282 U/L — ABNORMAL HIGH (ref 0–44)
AST: 301 U/L — ABNORMAL HIGH (ref 15–41)
Albumin: 3 g/dL — ABNORMAL LOW (ref 3.5–5.0)
Alkaline Phosphatase: 96 U/L (ref 38–126)
Anion gap: 18 — ABNORMAL HIGH (ref 5–15)
BUN: 72 mg/dL — ABNORMAL HIGH (ref 8–23)
CO2: 22 mmol/L (ref 22–32)
Calcium: 8.9 mg/dL (ref 8.9–10.3)
Chloride: 103 mmol/L (ref 98–111)
Creatinine, Ser: 2.89 mg/dL — ABNORMAL HIGH (ref 0.61–1.24)
GFR, Estimated: 22 mL/min — ABNORMAL LOW (ref >60)
Glucose, Bld: 180 mg/dL — ABNORMAL HIGH (ref 70–99)
Potassium: 3.9 mmol/L (ref 3.5–5.1)
Sodium: 143 mmol/L (ref 135–145)
Total Bilirubin: 4.1 mg/dL — ABNORMAL HIGH (ref 0.0–1.2)
Total Protein: 6.8 g/dL (ref 6.5–8.1)

## 2023-05-03 LAB — URINALYSIS, ROUTINE W REFLEX MICROSCOPIC
Bacteria, UA: NONE SEEN
Bilirubin Urine: NEGATIVE
Glucose, UA: NEGATIVE mg/dL
Ketones, ur: NEGATIVE mg/dL
Leukocytes,Ua: NEGATIVE
Nitrite: NEGATIVE
Protein, ur: 100 mg/dL — AB
Specific Gravity, Urine: 1.014 (ref 1.005–1.030)
pH: 5 (ref 5.0–8.0)

## 2023-05-03 LAB — CBC
HCT: 45.2 % (ref 39.0–52.0)
Hemoglobin: 14.3 g/dL (ref 13.0–17.0)
MCH: 33.2 pg (ref 26.0–34.0)
MCHC: 31.6 g/dL (ref 30.0–36.0)
MCV: 104.9 fL — ABNORMAL HIGH (ref 80.0–100.0)
Platelets: 124 10*3/uL — ABNORMAL LOW (ref 150–400)
RBC: 4.31 MIL/uL (ref 4.22–5.81)
RDW: 17 % — ABNORMAL HIGH (ref 11.5–15.5)
WBC: 14.7 10*3/uL — ABNORMAL HIGH (ref 4.0–10.5)
nRBC: 0 % (ref 0.0–0.2)

## 2023-05-03 LAB — LACTIC ACID, PLASMA
Lactic Acid, Venous: 4.4 mmol/L (ref 0.5–1.9)
Lactic Acid, Venous: >9 mmol/L (ref 0.5–1.9)

## 2023-05-03 LAB — COOXEMETRY PANEL
Carboxyhemoglobin: 1.3 % (ref 0.5–1.5)
Methemoglobin: <0.7 % (ref 0.0–1.5)
O2 Saturation: 54.3 %
Total hemoglobin: 13.2 g/dL (ref 12.0–16.0)
Total oxygen content: 53.4 %

## 2023-05-03 LAB — LIPASE, BLOOD: Lipase: 100 U/L — ABNORMAL HIGH (ref 11–51)

## 2023-05-03 LAB — RESP PANEL BY RT-PCR (RSV, FLU A&B, COVID)  RVPGX2
Influenza A by PCR: NEGATIVE
Influenza B by PCR: NEGATIVE
Resp Syncytial Virus by PCR: NEGATIVE
SARS Coronavirus 2 by RT PCR: NEGATIVE

## 2023-05-03 LAB — PROTIME-INR
INR: 5.6 (ref 0.8–1.2)
Prothrombin Time: 51 s — ABNORMAL HIGH (ref 11.4–15.2)

## 2023-05-03 LAB — TROPONIN I (HIGH SENSITIVITY)
Troponin I (High Sensitivity): 342 ng/L (ref <18)
Troponin I (High Sensitivity): 53 ng/L — ABNORMAL HIGH (ref <18)

## 2023-05-03 LAB — BILIRUBIN, DIRECT: Bilirubin, Direct: 2 mg/dL — ABNORMAL HIGH (ref 0.0–0.2)

## 2023-05-03 LAB — APTT: aPTT: 41 s — ABNORMAL HIGH (ref 24–36)

## 2023-05-03 LAB — CK: Total CK: 2978 U/L — ABNORMAL HIGH (ref 49–397)

## 2023-05-03 MED ORDER — MORPHINE SULFATE (PF) 4 MG/ML IV SOLN
4.0000 mg | Freq: Once | INTRAVENOUS | Status: AC
  Administered 2023-05-03: 4 mg via INTRAVENOUS
  Filled 2023-05-03: qty 1

## 2023-05-03 MED ORDER — LACTATED RINGERS IV SOLN
150.0000 mL/h | INTRAVENOUS | Status: AC
  Administered 2023-05-03 – 2023-05-04 (×2): 150 mL/h via INTRAVENOUS

## 2023-05-03 MED ORDER — LACTATED RINGERS IV BOLUS
1000.0000 mL | Freq: Once | INTRAVENOUS | Status: AC
  Administered 2023-05-03: 1000 mL via INTRAVENOUS

## 2023-05-03 MED ORDER — SODIUM CHLORIDE 0.9 % IV SOLN
2.0000 g | Freq: Once | INTRAVENOUS | Status: AC
  Administered 2023-05-03: 2 g via INTRAVENOUS
  Filled 2023-05-03: qty 12.5

## 2023-05-03 MED ORDER — SODIUM CHLORIDE 0.9 % IV SOLN: 2.0000 g | INTRAVENOUS | Status: DC

## 2023-05-03 MED ORDER — HYDROCODONE-ACETAMINOPHEN 5-325 MG PO TABS
1.0000 | ORAL_TABLET | ORAL | Status: DC | PRN
  Administered 2023-05-12: 1 via ORAL
  Filled 2023-05-03: qty 2

## 2023-05-03 MED ORDER — LACTATED RINGERS IV BOLUS
1000.0000 mL | Freq: Once | INTRAVENOUS | Status: AC
Start: 2023-05-03 — End: 2023-05-03
  Administered 2023-05-03: 1000 mL via INTRAVENOUS

## 2023-05-03 MED ORDER — ONDANSETRON HCL 4 MG/2ML IJ SOLN
4.0000 mg | Freq: Four times a day (QID) | INTRAMUSCULAR | Status: DC | PRN
Start: 2023-05-03 — End: 2023-05-14

## 2023-05-03 MED ORDER — VANCOMYCIN VARIABLE DOSE PER UNSTABLE RENAL FUNCTION (PHARMACIST DOSING): Status: DC

## 2023-05-03 MED ORDER — GADOBUTROL 1 MMOL/ML IV SOLN
7.5000 mL | Freq: Once | INTRAVENOUS | Status: AC | PRN
  Administered 2023-05-03: 7.5 mL via INTRAVENOUS

## 2023-05-03 MED ORDER — VITAMIN K1 10 MG/ML IJ SOLN
1.0000 mg | Freq: Once | INTRAVENOUS | Status: AC
  Administered 2023-05-04: 1 mg via INTRAVENOUS
  Filled 2023-05-03: qty 0.1

## 2023-05-03 MED ORDER — GUAIFENESIN ER 600 MG PO TB12
600.0000 mg | ORAL_TABLET | Freq: Two times a day (BID) | ORAL | Status: DC
  Administered 2023-05-04 – 2023-05-14 (×21): 600 mg via ORAL
  Filled 2023-05-03 (×21): qty 1

## 2023-05-03 MED ORDER — VANCOMYCIN HCL 750 MG/150ML IV SOLN
750.0000 mg | Freq: Once | INTRAVENOUS | Status: AC
  Administered 2023-05-04: 750 mg via INTRAVENOUS
  Filled 2023-05-03: qty 150

## 2023-05-03 MED ORDER — ONDANSETRON HCL 4 MG PO TABS: 4.0000 mg | ORAL_TABLET | Freq: Four times a day (QID) | ORAL | Status: DC | PRN

## 2023-05-03 MED ORDER — SODIUM CHLORIDE 0.9 % IV SOLN
500.0000 mg | Freq: Once | INTRAVENOUS | Status: AC
  Administered 2023-05-03: 500 mg via INTRAVENOUS
  Filled 2023-05-03: qty 5

## 2023-05-03 MED ORDER — ALBUTEROL SULFATE (2.5 MG/3ML) 0.083% IN NEBU
2.5000 mg | INHALATION_SOLUTION | RESPIRATORY_TRACT | Status: DC | PRN
Start: 2023-05-03 — End: 2023-05-14

## 2023-05-03 MED ORDER — ONDANSETRON HCL 4 MG/2ML IJ SOLN: 4.0000 mg | Freq: Once | INTRAMUSCULAR | Status: DC

## 2023-05-03 MED ORDER — METRONIDAZOLE 500 MG/100ML IV SOLN
500.0000 mg | Freq: Two times a day (BID) | INTRAVENOUS | Status: DC
  Administered 2023-05-04: 500 mg via INTRAVENOUS
  Filled 2023-05-03 (×2): qty 100

## 2023-05-03 MED ORDER — VANCOMYCIN HCL IN DEXTROSE 1-5 GM/200ML-% IV SOLN
1000.0000 mg | Freq: Once | INTRAVENOUS | Status: AC
  Administered 2023-05-03: 1000 mg via INTRAVENOUS
  Filled 2023-05-03: qty 200

## 2023-05-03 MED ORDER — ACETAMINOPHEN 325 MG PO TABS
650.0000 mg | ORAL_TABLET | Freq: Four times a day (QID) | ORAL | Status: DC | PRN
  Administered 2023-05-04 – 2023-05-11 (×5): 650 mg via ORAL
  Filled 2023-05-03 (×4): qty 2

## 2023-05-03 MED ORDER — LORAZEPAM 2 MG/ML IJ SOLN
0.5000 mg | INTRAMUSCULAR | Status: DC | PRN
Start: 2023-05-03 — End: 2023-05-14
  Administered 2023-05-07: 0.5 mg via INTRAVENOUS
  Filled 2023-05-03: qty 1

## 2023-05-03 MED ORDER — SODIUM CHLORIDE 0.9 % IV SOLN: 500.0000 mg | INTRAVENOUS | Status: DC

## 2023-05-03 MED ORDER — ACETAMINOPHEN 325 MG RE SUPP: 650.0000 mg | Freq: Four times a day (QID) | RECTAL | Status: DC | PRN

## 2023-05-03 NOTE — Assessment & Plan Note (Addendum)
 Patient found facedown but responsive MRI concerning for paravertebral C-spine fluid collection/edema Concern for cardiogenic event as reason for follow-up bed given low EF Continuous cardiac monitoring, echocardiogram, close monitoring in stepdown

## 2023-05-03 NOTE — Progress Notes (Signed)
 Elink monitoring for the code sepsis protocol.

## 2023-05-03 NOTE — Assessment & Plan Note (Addendum)
 Severe sepsis/shock versus cardiogenic shock Possible pneumonia Possible discitis Severe sepsis criteria include tachycardia, leukocytosis with lactic acidosis, AKI and hyperbilirubinemia Source uncertain, pneumonia, discitis or intra-abdominal,  Lower consideration for cardiogenic etiology/low output state, Follow-up cooximetry panel to help differentiate between septic and cardiogenic shock-->normal so consistent with septic picture Will cautiously continue fluids for possible septic shock with close hemodynamic monitoring Antibiotics for sepsis of unknown source with bank cefepime  and Flagyl  Will get right upper quadrant ultrasound to evaluate gallbladder and request formal GI opinion Daily weights Close cardiac pulmonary monitoring

## 2023-05-03 NOTE — Assessment & Plan Note (Signed)
 Second to severe sepsis versus low output and low flow state Monitor for improvement with above treatment Renal consult if worsening

## 2023-05-03 NOTE — Assessment & Plan Note (Signed)
 Accidental fall from bed-presumed syncope Patient does not know the circumstances of fall that she was found facedown-presuming he syncopized

## 2023-05-03 NOTE — ED Provider Notes (Signed)
 [facility] Provider Note    Event Date/Time   First MD Initiated Contact with Patient 05/03/23 1651     (approximate)   History   Chief Complaint Fall   HPI  [name]. is a 78 y.o. male with a past medical history of hypertension, diabetes, CAD, atrial fibrillation on Coumadin, CHF, and CKD who presents to the ED complaining of fall.  Patient reports that he was feeling fine when he went to bed last night around 9 PM, but the next thing he remembered he was waking up on the floor.  Wife stated that she returned home to find patient on the floor around 10:00 this morning.  Patient reports that he has been feeling generally ill and weak for the past 24 hours, also complains of some right shoulder pain from the fall.  He denies any fevers, cough, chest pain, shortness of breath, abdominal pain, nausea, vomiting, or diarrhea.  He also denies any dysuria or flank pain.     Physical Exam   Triage Vital Signs: ED Triage Vitals [05/03/23 1622]  Encounter Vitals Group     BP 105/75     Systolic BP Percentile      Diastolic BP Percentile      Pulse Rate (!) 121     Resp 16     Temp 98 F (36.7 C)     Temp Source Oral     SpO2 96 %     Weight 166 lb 3.6 oz (75.4 kg)     Height      Head Circumference      Peak Flow      Pain Score 0     Pain Loc      Pain Education      Exclude from Growth Chart     Most recent vital signs: Vitals:   05/03/23 1622  BP: 105/75  Pulse: (!) 121  Resp: 16  Temp: 98 F (36.7 C)  SpO2: 96%    Constitutional: Alert and oriented. Eyes: Conjunctivae are normal. Head: Atraumatic. Nose: No congestion/rhinnorhea. Mouth/Throat: Mucous membranes are dry. Neck: Midline cervical spine tenderness to palpation noted. Cardiovascular: Tachycardic, regular rhythm. Grossly normal heart sounds.  2+ radial pulses bilaterally. Respiratory: Normal respiratory effort.  No retractions. Lungs CTAB.  No chest wall tenderness to  palpation. Gastrointestinal: Soft and nontender. No distention. Musculoskeletal: No lower extremity tenderness nor edema.  Diffuse tenderness to palpation of right shoulder with no obvious deformity.  No tenderness to palpation of left upper extremity. Neurologic:  Normal speech and language. No gross focal neurologic deficits are appreciated.    ED Results / Procedures / Treatments   Labs (all labs ordered are listed, but only abnormal results are displayed) Labs Reviewed  COMPREHENSIVE METABOLIC PANEL WITH GFR - Abnormal; Notable for the following components:      Result Value   Glucose, Bld 180 (*)    BUN 72 (*)    Creatinine, Ser 2.89 (*)    Albumin 3.0 (*)    AST 301 (*)    ALT 282 (*)    Total Bilirubin 4.1 (*)    GFR, Estimated 22 (*)    Anion gap 18 (*)    All other components within normal limits  CBC - Abnormal; Notable for the following components:   WBC 14.7 (*)    MCV 104.9 (*)    RDW 17.0 (*)    Platelets 124 (*)    All other components  within normal limits  URINALYSIS, ROUTINE W REFLEX MICROSCOPIC - Abnormal; Notable for the following components:   Color, Urine YELLOW (*)    APPearance HAZY (*)    Hgb urine dipstick MODERATE (*)    Protein, ur 100 (*)    All other components within normal limits  LACTIC ACID, PLASMA - Abnormal; Notable for the following components:   Lactic Acid, Venous 4.4 (*)    All other components within normal limits  PROTIME-INR - Abnormal; Notable for the following components:   Prothrombin Time 51.0 (*)    INR 5.6 (*)    All other components within normal limits  CK - Abnormal; Notable for the following components:   Total CK 2,978 (*)    All other components within normal limits  LIPASE, BLOOD - Abnormal; Notable for the following components:   Lipase 100 (*)    All other components within normal limits  BILIRUBIN, DIRECT - Abnormal; Notable for the following components:   Bilirubin, Direct 2.0 (*)    All other components  within normal limits  TROPONIN I (HIGH SENSITIVITY) - Abnormal; Notable for the following components:   Troponin I (High Sensitivity) 342 (*)    All other components within normal limits  CULTURE, BLOOD (ROUTINE X 2)  CULTURE, BLOOD (ROUTINE X 2)  RESP PANEL BY RT-PCR (RSV, FLU A&B, COVID)  RVPGX2  LACTIC ACID, PLASMA  APTT  CBG MONITORING, ED  TROPONIN I (HIGH SENSITIVITY)     EKG  ED ECG REPORT I, Chesley Noon, the attending physician, personally viewed and interpreted this ECG.   Date: 05/03/2023  EKG Time: 16:29  Rate: 118  Rhythm: atrial fibrillation  Axis: LAD  Intervals:none  ST&T Change: None  RADIOLOGY CT head reviewed and interpreted by me with no hemorrhage or midline shift.  PROCEDURES:  Critical Care performed: Yes, see critical care procedure note(s)  .Critical Care  Performed by: Chesley Noon, MD Authorized by: Chesley Noon, MD   Critical care provider statement:    Critical care time (minutes):  30   Critical care time was exclusive of:  Separately billable procedures and treating other patients and teaching time   Critical care was necessary to treat or prevent imminent or life-threatening deterioration of the following conditions:  Cardiac failure, renal failure and sepsis   Critical care was time spent personally by me on the following activities:  Development of treatment plan with patient or surrogate, discussions with consultants, evaluation of patient's response to treatment, examination of patient, ordering and review of laboratory studies, ordering and review of radiographic studies, ordering and performing treatments and interventions, pulse oximetry, re-evaluation of patient's condition and review of old charts   I assumed direction of critical care for this patient from another provider in my specialty: no     Care discussed with: admitting provider      MEDICATIONS ORDERED IN ED: Medications  LORazepam (ATIVAN) injection 0.5 mg  (has no administration in time range)  vancomycin (VANCOCIN) IVPB 1000 mg/200 mL premix (has no administration in time range)  ceFEPIme (MAXIPIME) 2 g in sodium chloride 0.9 % 100 mL IVPB (2 g Intravenous New Bag/Given 05/03/23 1934)  azithromycin (ZITHROMAX) 500 mg in sodium chloride 0.9 % 250 mL IVPB (500 mg Intravenous New Bag/Given 05/03/23 1946)  lactated ringers bolus 1,000 mL (has no administration in time range)  lactated ringers bolus 1,000 mL (has no administration in time range)  morphine (PF) 4 MG/ML injection 4 mg (has no administration in time  range)  ondansetron (ZOFRAN) injection 4 mg (has no administration in time range)  lactated ringers bolus 1,000 mL (1,000 mLs Intravenous New Bag/Given 05/03/23 1820)     IMPRESSION / MDM / ASSESSMENT AND PLAN / ED COURSE  I reviewed the triage vital signs and the nursing notes.                              78 y.o. male with past medical history of hypertension, diabetes, CAD, CHF, CKD, and atrial fibrillation on Coumadin who presents to the ED with generalized weakness and fall out of bed at unknown time last night.  Patient's presentation is most consistent with acute presentation with potential threat to life or bodily function.  Differential diagnosis includes, but is not limited to, intracranial injury, cervical spine injury, shoulder fracture, dislocation, rhabdomyolysis, dehydration, electrolyte abnormality, AKI, sepsis, UTI, pneumonia.  Patient ill-appearing but in no acute distress, vital signs remarkable for tachycardia but otherwise reassuring.  We will check CT head and cervical spine for traumatic injury, also check x-ray of his right shoulder.  With his generalized weakness and tachycardia, sepsis workup was initiated and patient noted to have mild leukocytosis but no significant anemia.  Urinalysis borderline for infection, patient with AKI but no acute electrolyte abnormality.  He does have significant transaminitis with  elevation of bilirubin but normal alkaline phosphatase.  Will further assess with CT imaging of his abdomen/pelvis.  CT head is negative for acute process, CT cervical spine with some prevertebral fluid as well as endplate irregularity, could represent a ligamentous injury versus infection.  Given apparent sepsis, patient was started on broad-spectrum IV antibiotics and we will further assess with MRI of his cervical spine with and without contrast.  Patient was placed in a cervical collar for now.  CT abdomen/pelvis shows no biliary dilatation or gallstones, findings reviewed with Dr. Norma Fredrickson of GI, who does not think MRCP needed at this time, lab abnormalities likely due to his sepsis.  Patient with significantly elevated CK level and we will continue IV fluid resuscitation.  He also has elevated troponin at 342, continues to deny chest pain or shortness of breath and no ischemic changes noted on EKG.  We will hold off on heparin given his INR is elevated at greater than 5.  Case discussed with hospitalist for admission.     FINAL CLINICAL IMPRESSION(S) / ED DIAGNOSES   Final diagnoses:  Sepsis without acute organ dysfunction, due to unspecified organism (HCC)  Pneumonia of left lower lobe due to infectious organism  Injury of neck, initial encounter  Non-traumatic rhabdomyolysis  Elevated troponin     Rx / DC Orders   ED Discharge Orders     None        Note:  This document was prepared using Dragon voice recognition software and may include unintentional dictation errors.   Chesley Noon, MD 05/03/23 [phone]

## 2023-05-03 NOTE — Assessment & Plan Note (Signed)
 Elevated lipase Abnormal LFTs suspect related to hepatic congestion from low output cardiac failure versus severe sepsis GI was consulted from the ED and did not think there was concern for biliary tract disease due to normal alk phos Will get RUQ ultrasound Monitor LFTs

## 2023-05-03 NOTE — Consult Note (Signed)
 PHARMACY - ANTICOAGULATION CONSULT NOTE  Pharmacy Consult for Warfarin Indication: atrial fibrillation  Allergies  Allergen Reactions   Shellfish Allergy Other (See Comments) and Hives    Pt. instructed by MD to avoid seafood    Patient Measurements: Height: 5\' 9"  (175.3 cm) Weight: 75.4 kg (166 lb 3.6 oz) IBW/kg (Calculated) : 70.7 HEPARIN DW (KG): 75.4  Vital Signs: Temp: 98 F (36.7 C) (03/28 1622) Temp Source: Oral (03/28 1622) BP: 105/75 (03/28 1622) Pulse Rate: 121 (03/28 1622)  Labs: Recent Labs    05/03/23 1630 05/03/23 1727  HGB 14.3  --   HCT 45.2  --   PLT 124*  --   LABPROT  --  51.0*  INR  --  5.6*  CREATININE 2.89*  --   CKTOTAL  --  2,978*  TROPONINIHS  --  342*    Estimated Creatinine Clearance: 21.7 mL/min (A) (by C-G formula based on SCr of 2.89 mg/dL (H)).   Medical History: Past Medical History:  Diagnosis Date   Adenocarcinoma of colon (HCC) 06/19/2013   a. moderately differentiated stage IIIc (T4bN2a M0) adenocarcinoma of colon  s/p colectomy followed by chemoRx with oxaliplatin & fluorouacil.   Anemia    CAD (coronary artery disease)    a. 02/2015 Cath: LM nl, LAD 50/40 mid/distal, LCX 80p, RCA 40p, 30d, RPL1 40, CO 3.27, CI 1.65; b. 06/09/15 PCI: LM minor irregs, m-dLAD 50%, dLAD 40%, pLCx 80% (s/p PCI/DES 0%), pRCA 40%, dRCA 30%, 1st RPLB 40%   Chronic kidney disease, stage 3a (HCC)    Chronic systolic CHF (congestive heart failure) (HCC)    a. 02/2015 Echo: EF 20-25%, diff HK, mod MR, mildy dil LA, mildly dil RV w/ mod RV syst dysfxn, mildly dil RA, mod TR, PASP ; b. 09/2017 Echo: EF 40-45%, diff HK. Gr1 DD. Mild to mod MR. Mildly to mod dil LA; c. 09/2019 Echo: EF 25-30%, glob HK, mod reduced RV fxn, sev elev PASP. Mod dil LA. Mild-mod MR; d. 05/2021 Echo: EF 20-25%, glob HK, sev red RV fxn, sev BAE, sev MR/TR/TS, triv AI.   Diabetes mellitus without complication (HCC)    Essential hypertension    Hypertensive heart disease    Left  inguinal hernia 05/2020   Mixed Ischemic and Non-ischemic Cardiomyopathy    a. 02/2015 EF 20-25%, diff HK; b. 09/2016 EF 40-45%; c. 09/2019 EF 25-30%; d. 05/2021 Echo: EF 20-25%, glob HK.   Persistent atrial fibrillation (HCC)    a. Dx 05/2021-->[id number] = 5-->eliquis.   Pulmonary hypertension (HCC)    Severe mitral regurgitation    a. 09/2017 Echo: Mild to mod MR; b. 09/2019 Echo: mild-mod MR; c. 05/2021 Echo: Sev MR/TR/TS.   Tubular adenoma of colon     Medications:  Per note from Anti-Coag visit on 04/24/23 patient taking Warfarin 2.5 mg po daily.  Assessment: 78 yo male presenting to ED by EMS due to being found in floor by wife.  PMH includes Afib on Warfarin, CAD, DM, HTN, CHF, and CKD.  Patient treated for sepsis likely due to pneumonia.  Pharmacy consulted to manage Warfarin.  Goal of Therapy:  INR 2-3 Monitor platelets by anticoagulation protocol: Yes    Date INR Warfarin Dose  3/28 5.6 Hold         Plan:  INR is supratherpaeutic Hold Warfarin Daily INR while admitted and CBC at least weekly.  Barrie Folk, PharmD 05/03/2023,7:54 PM

## 2023-05-03 NOTE — Assessment & Plan Note (Addendum)
 NICM, EF 20 to 25%-declined AICD in the past Edema lower extremities but otherwise appears to be breathing easily Holding blood pressure lowering GDMT due to soft BPs and in the setting of sepsis

## 2023-05-03 NOTE — Consult Note (Signed)
 CODE SEPSIS - PHARMACY COMMUNICATION  **Broad Spectrum Antibiotics should be administered within 1 hour of Sepsis diagnosis**  Time Code Sepsis Called/Page Received: 1850  Antibiotics Ordered: cefepime, azithromycin, and vancomycin  Time of 1st antibiotic administration: 1934  Additional action taken by pharmacy: N/A  Barrie Folk ,PharmD Clinical Pharmacist  05/03/2023  6:51 PM

## 2023-05-03 NOTE — Assessment & Plan Note (Addendum)
 Elevated troponin Troponin 340 but suspecting demand ischemia Continue to trend Will get an updated echo which will also evaluate for wall motion abnormality Holding aspirin due to supratherapeutic INR Continue atorvastatin, Coreg with hold parameters Cardiology consult

## 2023-05-03 NOTE — Assessment & Plan Note (Addendum)
 Paravertebral C-spine fluid collection/edema --possible osteomyelitis/discitis Chronic baseline neck pain - Patient found facedown, unable to get up - Neurologic exam with no focal deficit.  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 Findings could be either traumatic or infectious in etiology. A short interval follow-up MRI could assess for change if clinically warranted -Discussed findings with neurosurgeon, Dr. Adriana Simas due to my concern for possible neck injury in the setting of supratherapeutic INR.  He advises 1.  Not unreasonable to reverse Coumadin in case of bleed 2.  Antibiotics to treat possible discitis given presentation with sepsis 3.  Maintain patient in cervical collar 4.  Serial neurochecks and evaluate for focal deficits-if deficit, contact him by phone Patient will be admitted to stepdown IR consulted-keeping n.p.o. Neurosurgery consult

## 2023-05-03 NOTE — H&P (Addendum)
 History and Physical    Patient: [name]. FMW:[id number] DOB: [birth year] DOA: 05/03/2023 DOS: the patient was seen and examined on 05/03/2023 PCP: Maribeth Camellia MATSU, MD (Inactive)  Patient coming from: Home  Chief Complaint:  Chief Complaint  Patient presents with   Fall    HPI: [name]. is a 78 y.o. male with medical history significant for CAD s/p stenting, chronic HFrEF secondary to NICM (EF 20 to 25% 1/17), persistent A-fib on warfarin due to cost of Eliquis , HTN, HLD, CKD 3B, and colon cancer 2015, who is being admitted with multiple acute derangements, related to a fall off his bed with no recollection of the circumstances.  Per wife at bedside, she left patient in bed this morning and when she returned home at 10 AM patient was lying on the floor on his stomach with his head turned to the side.  She states he was awake and alert.  He was unable to get up.  At baseline he does not walk with a cane or walker and typically he would have been able to get up on his own.  Patient states he had been feeling weak for a couple days prior to the episode.  He was otherwise at his baseline.  He has chronic neck pain which is no worse than his baseline.  He has chronic numbness in his legs related to chemotherapy for colon cancer which is at his baseline.  He denies one-sided weakness and denies new numbness.  He denies chest pain, shortness of breath, cough, fever or chills, dysuria, abdominal pain, nausea vomiting or change in bowel habits.   Denies headache, visual disturbance.  Chart review reveals he last saw his cardiologist on 3/11 when he was noted to be volume up, with no symptoms of angina at the time.  No changes were made to his GDMT.  He reiterated that he was not interested in ICD and that Entresto  was cost prohibitive, so he remained on losartan .  Torsemide  remained at the same dose as he said swelling was improving. ED course and data review: Tachycardic to 121 with soft  blood pressure of 105/75 and otherwise normal vitals Labs notable for WBC 14,700With with lactic acid 4.4 Troponin 342, BNP pending Creatinine 2.89 up from baseline of 1.7 with anion gap of 18 Abnormal LFTs with AST/ALT of 301/282, total bilirubin 4.1.  Lipase at 100 Coagulopathy with INR 5.6, platelets 1 24,000 CK 2978 Respiratory viral panel negative for COVID flu and RSV Urinalysis negative for infection CT abdomen and pelvis with no acute abnormalities Chest x-ray showing left lower lobe consolidation likely atelectasis, pneumonia not ruled out CT C-spine nontraumatic but concerning for prevertebral fluid with recommendations for follow-up MRI to evaluate for osteomyelitis discitis  The ED provider spoke with GI given elevated LFTs and lipase however acute biliary tract disease was thought unlikely given normal alk phos.  Opines that likely related to sepsis MRI ordered for further evaluation of abnormality on CT C spine Patient started on cefepime  vancomycin  and azithromycin  for sepsis of unknown source possibly respiratory and given sepsis fluid bolus  Being maintained in c-collar pending MRI  Hospitalist consulted for admission.   Review of Systems: As mentioned in the history of present illness. All other systems reviewed and are negative.  Past Medical History:  Diagnosis Date   Adenocarcinoma of colon (HCC) 06/19/2013   a. moderately differentiated stage IIIc (T4bN2a M0) adenocarcinoma of colon  s/p colectomy followed by chemoRx with oxaliplatin &  fluorouacil.   Anemia    CAD (coronary artery disease)    a. 02/2015 Cath: LM nl, LAD 50/40 mid/distal, LCX 80p, RCA 40p, 30d, RPL1 40, CO 3.27, CI 1.65; b. 06/09/15 PCI: LM minor irregs, m-dLAD 50%, dLAD 40%, pLCx 80% (s/p PCI/DES 0%), pRCA 40%, dRCA 30%, 1st RPLB 40%   Chronic kidney disease, stage 3a (HCC)    Chronic systolic CHF (congestive heart failure) (HCC)    a. 02/2015 Echo: EF 20-25%, diff HK, mod MR, mildy dil LA, mildly  dil RV w/ mod RV syst dysfxn, mildly dil RA, mod TR, PASP ; b. 09/2017 Echo: EF 40-45%, diff HK. Gr1 DD. Mild to mod MR. Mildly to mod dil LA; c. 09/2019 Echo: EF 25-30%, glob HK, mod reduced RV fxn, sev elev PASP. Mod dil LA. Mild-mod MR; d. 05/2021 Echo: EF 20-25%, glob HK, sev red RV fxn, sev BAE, sev MR/TR/TS, triv AI.   Diabetes mellitus without complication (HCC)    Essential hypertension    Hypertensive heart disease    Left inguinal hernia 05/2020   Mixed Ischemic and Non-ischemic Cardiomyopathy    a. 02/2015 EF 20-25%, diff HK; b. 09/2016 EF 40-45%; c. 09/2019 EF 25-30%; d. 05/2021 Echo: EF 20-25%, glob HK.   Persistent atrial fibrillation (HCC)    a. Dx 05/2021-->CHA2DS2VASc = 5-->eliquis .   Pulmonary hypertension (HCC)    Severe mitral regurgitation    a. 09/2017 Echo: Mild to mod MR; b. 09/2019 Echo: mild-mod MR; c. 05/2021 Echo: Sev MR/TR/TS.   Tubular adenoma of colon    Past Surgical History:  Procedure Laterality Date   APPENDECTOMY N/A 06/19/2017   Location: [facility]; Surgeon: Elsie PHEBE Cable, MD   CARDIAC CATHETERIZATION Bilateral 02/24/2015   Procedure: Right/Left Heart Cath and Coronary Angiography;  Surgeon: Deatrice DELENA Cage, MD;  Location: [facility] INVASIVE CV [facility];  Service: Cardiovascular;  Laterality: Bilateral;   CARDIAC CATHETERIZATION N/A 06/09/2015   Procedure: Coronary Stent Intervention (3.0 x 12 mm Xience Alpine DES to LCx);  Surgeon: Deatrice DELENA Cage, MD;  Location: [facility] INVASIVE CV LAB;  Service: Cardiovascular;  Laterality: N/A   COLECTOMY  06/19/2017   Descending and proximal sigmoid colectomy, LEFT ureterolysis, partial cecectomy, appendectomy; Location: [facility]; Surgeon: Elsie PHEBE Cable, MD   COLONOSCOPY     COLONOSCOPY WITH PROPOFOL  N/A 12/11/2016   Procedure: COLONOSCOPY WITH PROPOFOL ;  Surgeon: Gaylyn Gladis PENNER, MD;  Location: [facility] ENDOSCOPY;  Service: Endoscopy;  Laterality: N/A;   ESOPHAGOGASTRODUODENOSCOPY     HERNIA REPAIR     PORT-A-CATH REMOVAL  N/A 07/12/2014   Procedure: REMOVAL PORT-A-CATH;  Surgeon: Elsie Cable, MD;  Location: [facility] ORS;  Service: General;  Laterality: N/A;   PORTACATH PLACEMENT Left 2015   Social History:  reports that he has never smoked. He has never used smokeless tobacco. He reports that he does not currently use alcohol. He reports that he does not use drugs.  Allergies  Allergen Reactions   Shellfish Allergy Other (See Comments) and Hives    Pt. instructed by MD to avoid seafood    Family History  Problem Relation Age of Onset   Cancer Mother    Other Father        unknown medical history    Prior to Admission medications   Medication Sig Start Date End Date Taking? Authorizing Provider  amLODipine  (NORVASC ) 5 MG tablet Take 5 mg by mouth daily.    [provider]  aspirin  81 MG chewable tablet Chew by mouth daily.  [provider]  atorvastatin  (LIPITOR) 40 MG tablet TAKE 1 TABLET(40 MG) BY MOUTH DAILY 12/21/22   Darron Deatrice LABOR, MD  Blood Glucose Monitoring Suppl DEVI 1 each by Does not apply route daily. May substitute to any manufacturer covered by patient's insurance. 01/23/23   Maribeth Camellia MATSU, MD  carvedilol  (COREG ) 3.125 MG tablet TAKE 1 TABLET BY MOUTH 2 TIMES DAILY WITH A MEAL 03/25/23   Darron Deatrice LABOR, MD  cholecalciferol (VITAMIN D3) 25 MCG (1000 UNIT) tablet Take 1,000 Units by mouth daily.    [provider]  clopidogrel  (PLAVIX ) 75 MG tablet Take 75 mg by mouth daily.    [provider]  FARXIGA  10 MG TABS tablet TAKE 1 TABLET(10 MG) BY MOUTH DAILY BEFORE BREAKFAST 12/26/22   Bensimhon, Toribio SAUNDERS, MD  ferrous sulfate  325 (65 FE) MG tablet Take 325 mg by mouth daily with breakfast.    [provider]  gabapentin  (NEURONTIN ) 100 MG capsule Take 100 mg by mouth 3 (three) times daily. Taking 2 -100 mg pills in am and 1 -100 mg at night Patient not taking: Reported on 08/21/2022 04/12/21 05/29/22  [provider]  Glucose Blood  (BLOOD GLUCOSE TEST STRIPS) STRP 1 each by In Vitro route daily as needed. May substitute to any manufacturer covered by patient's insurance. 01/23/23   Maribeth Camellia MATSU, MD  Lancet Device MISC 1 each by Does not apply route daily as needed. May substitute to any manufacturer covered by patient's insurance. 01/23/23   Maribeth Camellia MATSU, MD  Lancets Misc. MISC 1 each by Does not apply route daily as needed. May substitute to any manufacturer covered by patient's insurance. 01/23/23   Maribeth Camellia MATSU, MD  loratadine  (CLARITIN ) 10 MG tablet TAKE 1 TABLET(10 MG) BY MOUTH DAILY 05/25/22   Maribeth Camellia MATSU, MD  losartan  (COZAAR ) 100 MG tablet Take 1 tablet (100 mg total) by mouth daily. 08/21/22   Bensimhon, Toribio SAUNDERS, MD  Magnesium  Oxide 400 MG CAPS Take 1 capsule (400 mg total) by mouth daily. 12/12/21   Vivienne Lonni Ingle, NP  metFORMIN  (GLUCOPHAGE ) 500 MG tablet Take 500 mg by mouth 2 (two) times daily with a meal.    [provider]  potassium chloride  SA (KLOR-CON  M) 20 MEQ tablet TAKE 2 TABLETS(40 MEQ) BY MOUTH DAILY 04/23/22   Vivienne Lonni Ingle, NP  spironolactone  (ALDACTONE ) 25 MG tablet TAKE 1 TABLET(25 MG) BY MOUTH DAILY 03/13/22   [place]ette [place] A, FNP  torsemide  (DEMADEX ) 20 MG tablet Take 1 tablet (20 mg total) by mouth daily. 02/27/23   Bensimhon, Toribio SAUNDERS, MD  warfarin (COUMADIN ) 5 MG tablet TAKE 1/2 TO 1 TABLET BY MOUTH EVERY DAY AS DIRECTED BY COUMADIN  CLINIC 12/26/22   Darron Deatrice LABOR, MD    Physical Exam: Vitals:   05/03/23 1622 05/03/23 2128 05/03/23 2200  BP: 105/75  93/68  Pulse: (!) 121  65  Resp: 16  (!) 21  Temp: 98 F (36.7 C) 97.9 F (36.6 C)   TempSrc: Oral Oral   SpO2: 96%  96%  Weight: 75.4 kg    Height: 5' 9 (1.753 m)     Physical Exam Vitals and nursing note reviewed.  Constitutional:      General: He is not in acute distress.    Interventions: Cervical collar in place.     Comments: Patient awake and alert, in c-collar  HENT:      Head: Normocephalic and atraumatic.  Cardiovascular:     Rate  and Rhythm: Tachycardia present. Rhythm irregular.     Heart sounds: No murmur heard.    No systolic murmur is present.     No diastolic murmur is present.  Pulmonary:     Effort: Pulmonary effort is normal.     Breath sounds: Normal breath sounds.  Abdominal:     Palpations: Abdomen is soft.     Tenderness: There is no abdominal tenderness.  Musculoskeletal:     Right lower leg: 2+ Edema present.     Left lower leg: 2+ Edema present.  Neurological:     General: No focal deficit present.     Mental Status: Mental status is at baseline.     Labs on Admission: I have personally reviewed following labs and imaging studies  CBC: Recent Labs  Lab 05/03/23 1630  WBC 14.7*  HGB 14.3  HCT 45.2  MCV 104.9*  PLT 124*   Basic Metabolic Panel: Recent Labs  Lab 05/03/23 1630  NA 143  K 3.9  CL 103  CO2 22  GLUCOSE 180*  BUN 72*  CREATININE 2.89*  CALCIUM  8.9   GFR: Estimated Creatinine Clearance: 21.7 mL/min (A) (by C-G formula based on SCr of 2.89 mg/dL (H)). Liver Function Tests: Recent Labs  Lab 05/03/23 1630  AST 301*  ALT 282*  ALKPHOS 96  BILITOT 4.1*  PROT 6.8  ALBUMIN  3.0*   Recent Labs  Lab 05/03/23 1727  LIPASE 100*   No results for input(s): AMMONIA in the last 168 hours. Coagulation Profile: Recent Labs  Lab 05/03/23 1727  INR 5.6*   Cardiac Enzymes: Recent Labs  Lab 05/03/23 1727  CKTOTAL 2,978*   BNP (last 3 results) No results for input(s): PROBNP in the last 8760 hours. HbA1C: No results for input(s): HGBA1C in the last 72 hours. CBG: No results for input(s): GLUCAP in the last 168 hours. Lipid Profile: No results for input(s): CHOL, HDL, LDLCALC, TRIG, CHOLHDL, LDLDIRECT in the last 72 hours. Thyroid  Function Tests: No results for input(s): TSH, T4TOTAL, FREET4, T3FREE, THYROIDAB in the last 72 hours. Anemia Panel: No results for  input(s): VITAMINB12, FOLATE, FERRITIN, TIBC, IRON , RETICCTPCT in the last 72 hours. Urine analysis:    Component Value Date/Time   COLORURINE YELLOW (A) 05/03/2023 1630   APPEARANCEUR HAZY (A) 05/03/2023 1630   APPEARANCEUR Clear 08/23/2021 1338   LABSPEC 1.014 05/03/2023 1630   PHURINE 5.0 05/03/2023 1630   GLUCOSEU NEGATIVE 05/03/2023 1630   HGBUR MODERATE (A) 05/03/2023 1630   BILIRUBINUR NEGATIVE 05/03/2023 1630   BILIRUBINUR Negative 08/23/2021 1338   KETONESUR NEGATIVE 05/03/2023 1630   PROTEINUR 100 (A) 05/03/2023 1630   NITRITE NEGATIVE 05/03/2023 1630   LEUKOCYTESUR NEGATIVE 05/03/2023 1630    Radiological Exams on Admission: MR Cervical Spine W and Wo Contrast Result Date: 05/03/2023 CLINICAL DATA:  Neck pain, infection suspected, positive xray/CT. EXAM: MRI CERVICAL SPINE WITHOUT AND WITH CONTRAST TECHNIQUE: Multiplanar and multiecho pulse sequences of the cervical spine, to include the craniocervical junction and cervicothoracic junction, were obtained without and with intravenous contrast. CONTRAST:  7.5mL GADAVIST  GADOBUTROL  1 MMOL/ML IV SOLN COMPARISON:  None Available. FINDINGS: Alignment: Straightening.  No substantial sagittal subluxation. Vertebrae: Edema within the disc at C4-C5 without substantial adjacent marrow edema. Please see same day CT of the cervical spine for better evaluation of osseous bony detail. Cord: Normal cord signal. Posterior Fossa, vertebral arteries, paraspinal tissues: Large volume prevertebral edema extending from the craniocervical junction to the lower cervical levels. Possible thinning versus destruction of  the posterior longitudinal ligament at C4-C5 (series 7, image 9). Otherwise, no evidence of ligamentous injury. Disc levels: Thin epidural fluid collection which extends circumferentially from C4-C5 inferiorly into the visualized upper thoracic canal. C2-C3: Bilateral facet and uncovertebral hypertrophy with moderate left foraminal  stenosis. Mild canal stenosis. C3-C4: Posterior disc osteophyte complex with bilateral facet uncovertebral hypertrophy. Resulting moderate to severe bilateral foraminal stenosis and mild canal stenosis. C4-C5: Posterior disc osteophyte complex with bilateral facet uncovertebral hypertrophy. Resulting severe bilateral foraminal stenosis and moderate canal stenosis. C5-C6: Posterior disc osteophyte complex with bilateral facet uncovertebral hypertrophy. Resulting severe bilateral foraminal stenosis and mild to moderate canal stenosis. C6-C7: Posterior disc osteophyte complex with bilateral facet uncovertebral hypertrophy. Resulting moderate to severe bilateral foraminal stenosis and mild canal stenosis. C7-T1: Bilateral facet uncovertebral hypertrophy without significant stenosis. IMPRESSION: 1. Large volume prevertebral edema in the upper cervical spine with edema in the C4-C5 disc, possible thinning versus disruption of the posterior longitudinal ligament at this level, and thin epidural fluid collection extending from this level inferiorly into the visualized upper thoracic spine. Findings could be either traumatic or infectious in etiology. A short interval follow-up MRI could assess for change if clinically warranted. 2. Superimposed severe multilevel degenerative change, detailed above and including severe bilateral foraminal stenosis C4-C5 and C5-C6, moderate canal stenosis at C4-C5 and mild-to-moderate canal stenosis C5-C6. Findings discussed with Dr. Willo via telephone at 9:45 p.m. Electronically Signed   By: Gilmore GORMAN Molt M.D.   On: 05/03/2023 22:00   CT Head Wo Contrast Result Date: 05/03/2023 CLINICAL DATA:  Clemens out of bed.  Found on the floor. EXAM: CT HEAD WITHOUT CONTRAST CT CERVICAL SPINE WITHOUT CONTRAST TECHNIQUE: Multidetector CT imaging of the head and cervical spine was performed following the standard protocol without intravenous contrast. Multiplanar CT image reconstructions of the  cervical spine were also generated. RADIATION DOSE REDUCTION: This exam was performed according to the departmental dose-optimization program which includes automated exposure control, adjustment of the mA and/or kV according to patient size and/or use of iterative reconstruction technique. COMPARISON:  None Available. FINDINGS: CT HEAD FINDINGS Brain: No evidence of acute infarction, hemorrhage, hydrocephalus, extra-axial collection or mass lesion/mass effect. Atrophy and chronic microvascular ischemic changes. Vascular: Calcified atherosclerosis at the skull base. No hyperdense vessel. Skull: Normal. Negative for fracture or focal lesion. Sinuses/Orbits: No acute finding. Other: None. CT CERVICAL SPINE FINDINGS Alignment: Reversal of the normal cervical lordosis. No traumatic malalignment. Skull base and vertebrae: No acute fracture. No primary bone lesion or focal pathologic process. Mild irregularity of the C4-C5 and C6-C7 endplates. Soft tissues and spinal canal: Prevertebral fluid and swelling extending from C2-C6 (series 4, image 44). No visible canal hematoma. Disc levels: Multilevel degenerative changes, moderate from C4-C5 through C6-C7. Upper chest: Negative. Other: None. IMPRESSION: Head: 1. No acute intracranial abnormality. Atrophy and chronic microvascular ischemic changes. Cervical spine: 1. No acute cervical spine fracture or traumatic listhesis. 2. Prevertebral fluid and swelling extending from C2-C6, concerning for ligamentous injury. MRI cervical spine is recommended. 3. Irregularity of the C4-C5 and C6-C7 endplates is likely degenerative, but given prevertebral fluid and leukocytosis, attention on follow-up MRI is recommended to exclude osteomyelitis-discitis. Electronically Signed   By: Elsie ONEIDA Shoulder M.D.   On: 05/03/2023 18:09   CT Cervical Spine Wo Contrast Result Date: 05/03/2023 CLINICAL DATA:  Clemens out of bed.  Found on the floor. EXAM: CT HEAD WITHOUT CONTRAST CT CERVICAL SPINE  WITHOUT CONTRAST TECHNIQUE: Multidetector CT imaging of the head and cervical spine  was performed following the standard protocol without intravenous contrast. Multiplanar CT image reconstructions of the cervical spine were also generated. RADIATION DOSE REDUCTION: This exam was performed according to the departmental dose-optimization program which includes automated exposure control, adjustment of the mA and/or kV according to patient size and/or use of iterative reconstruction technique. COMPARISON:  None Available. FINDINGS: CT HEAD FINDINGS Brain: No evidence of acute infarction, hemorrhage, hydrocephalus, extra-axial collection or mass lesion/mass effect. Atrophy and chronic microvascular ischemic changes. Vascular: Calcified atherosclerosis at the skull base. No hyperdense vessel. Skull: Normal. Negative for fracture or focal lesion. Sinuses/Orbits: No acute finding. Other: None. CT CERVICAL SPINE FINDINGS Alignment: Reversal of the normal cervical lordosis. No traumatic malalignment. Skull base and vertebrae: No acute fracture. No primary bone lesion or focal pathologic process. Mild irregularity of the C4-C5 and C6-C7 endplates. Soft tissues and spinal canal: Prevertebral fluid and swelling extending from C2-C6 (series 4, image 44). No visible canal hematoma. Disc levels: Multilevel degenerative changes, moderate from C4-C5 through C6-C7. Upper chest: Negative. Other: None. IMPRESSION: Head: 1. No acute intracranial abnormality. Atrophy and chronic microvascular ischemic changes. Cervical spine: 1. No acute cervical spine fracture or traumatic listhesis. 2. Prevertebral fluid and swelling extending from C2-C6, concerning for ligamentous injury. MRI cervical spine is recommended. 3. Irregularity of the C4-C5 and C6-C7 endplates is likely degenerative, but given prevertebral fluid and leukocytosis, attention on follow-up MRI is recommended to exclude osteomyelitis-discitis. Electronically Signed   By: Elsie ONEIDA Shoulder M.D.   On: 05/03/2023 18:09   DG Shoulder Right Result Date: 05/03/2023 CLINICAL DATA:  Fall and right shoulder pain. EXAM: RIGHT SHOULDER - 2+ VIEW COMPARISON:  None available. FINDINGS: There is no acute fracture or dislocation. Degenerative changes of the right AC joint and mild spurring of the bony glenoid. The soft tissues are unremarkable. IMPRESSION: 1. No acute fracture or dislocation. 2. Degenerative changes. Electronically Signed   By: Vanetta Chou M.D.   On: 05/03/2023 18:09   CT ABDOMEN PELVIS WO CONTRAST Result Date: 05/03/2023 CLINICAL DATA:  Unwitnessed fall.  History of colon cancer. EXAM: CT ABDOMEN AND PELVIS WITHOUT CONTRAST TECHNIQUE: Multidetector CT imaging of the abdomen and pelvis was performed following the standard protocol without IV contrast. RADIATION DOSE REDUCTION: This exam was performed according to the departmental dose-optimization program which includes automated exposure control, adjustment of the mA and/or kV according to patient size and/or use of iterative reconstruction technique. COMPARISON:  May 11, 2021. FINDINGS: Lower chest: Minimal left pleural effusion is noted with adjacent subsegmental atelectasis. Hepatobiliary: No focal liver abnormality is seen. No gallstones, gallbladder wall thickening, or biliary dilatation. Pancreas: Unremarkable. No pancreatic ductal dilatation or surrounding inflammatory changes. Spleen: Normal in size without focal abnormality. Adrenals/Urinary Tract: Adrenal glands appear normal. Stable bilateral renal cysts are noted for which no further follow-up is required. No hydronephrosis or renal obstruction is noted. Urinary bladder is unremarkable. Stomach/Bowel: Stomach is unremarkable. Status post appendectomy. There is no evidence of bowel obstruction or inflammation. Vascular/Lymphatic: Aortic atherosclerosis. No enlarged abdominal or pelvic lymph nodes. Reproductive: Prostate is unremarkable. Other: No definite hernia is  noted. Minimal free fluid is noted in the pelvis and pericolic gutters suggesting ascites. Musculoskeletal: No acute or significant osseous findings. IMPRESSION: Minimal left pleural effusion with minimal adjacent subsegmental atelectasis. Minimal ascites is noted. No other acute abnormality seen in the abdomen or pelvis. Aortic Atherosclerosis (ICD10-I70.0). Electronically Signed   By: Lynwood Landy Raddle M.D.   On: 05/03/2023 17:59   DG Chest 2  View Result Date: 05/03/2023 CLINICAL DATA:  Weakness, sepsis.  Found down. EXAM: CHEST - 2 VIEW COMPARISON:  Abdominopelvic CT same date. Chest radiographs 06/03/2021 and 05/29/2021. Chest CT 09/20/2020. FINDINGS: Stable cardiomegaly and aortic atherosclerosis. Persistent left pleural effusion with left lower lobe consolidation, similar to prior chest radiographs. The right lung is clear. No evidence of edema or pneumothorax. The bones appear unchanged. IMPRESSION: Persistent left pleural effusion with left lower lobe consolidation, similar to prior chest radiographs and likely reflecting atelectasis or scarring. Left lower lobe pneumonia not excluded. No edema. Electronically Signed   By: Elsie Perone M.D.   On: 05/03/2023 17:47     Data Reviewed: Relevant notes from primary care and specialist visits, past discharge summaries as available in EHR, including Care Everywhere. Prior diagnostic testing as pertinent to current admission diagnoses Updated medications and problem lists for reconciliation ED course, including vitals, labs, imaging, treatment and response to treatment Triage notes, nursing and pharmacy notes and ED provider's notes Notable results as noted in HPI   Assessment and Plan: * Severe sepsis (HCC) Severe sepsis/shock versus cardiogenic shock Possible pneumonia Possible discitis Severe sepsis criteria include tachycardia, leukocytosis with lactic acidosis, AKI and hyperbilirubinemia Source uncertain, pneumonia, discitis or  intra-abdominal,  Lower consideration for cardiogenic etiology/low output state, Follow-up cooximetry panel to help differentiate between septic and cardiogenic shock-->normal so consistent with septic picture Will cautiously continue fluids for possible septic shock with close hemodynamic monitoring Antibiotics for sepsis of unknown source with bank cefepime  and Flagyl  Will get right upper quadrant ultrasound to evaluate gallbladder and request formal GI opinion Daily weights Close cardiac pulmonary monitoring   Pneumonia, possible Continue above antibiotics SLP consult Aspiration precautions Bedside swallow eval  DuoNebs as needed Guaifenesin  Incentive spirometer Supplemental oxygen if needed  Abnormal MRI, cervical spine Paravertebral C-spine fluid collection/edema --possible osteomyelitis/discitis Chronic baseline neck pain - Patient found facedown, unable to get up - Neurologic exam with no focal deficit.  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 Findings could be either traumatic or infectious in etiology. A short interval follow-up MRI could assess for change if clinically warranted -Discussed findings with neurosurgeon, Dr. Bluford due to my concern for possible neck injury in the setting of supratherapeutic INR.  He advises 1.  Not unreasonable to reverse Coumadin  in case of bleed 2.  Antibiotics to treat possible discitis given presentation with sepsis 3.  Maintain patient in cervical collar 4.  Serial neurochecks and evaluate for focal deficits-if deficit, contact him by phone Patient will be admitted to stepdown IR consulted-keeping  n.p.o. Neurosurgery consult   Syncope, presumed Patient found facedown but responsive MRI concerning for paravertebral C-spine fluid collection/edema Concern for cardiogenic event as reason for follow-up bed given low EF Continuous cardiac monitoring, echocardiogram, close monitoring in stepdown  Abnormal LFTs--possible biliary tract disease Elevated lipase Abnormal LFTs suspect related to hepatic congestion from low output cardiac failure versus severe sepsis GI was consulted from the ED and did not think there was concern for biliary tract disease due to normal alk phos Will get RUQ ultrasound Monitor LFTs  CAD S/P percutaneous coronary angioplasty Elevated troponin Troponin 340 but suspecting demand ischemia Continue to trend Will get an updated echo which  will also evaluate for wall motion abnormality Holding aspirin  due to supratherapeutic INR Continue atorvastatin , Coreg  with hold parameters Cardiology consult  Atrial fibrillation with rapid ventricular response (HCC) Heart rate in the 120s to 130s, likely physiologic BP soft, not supporting heart rate control agents Will trial digoxin and if not improving will start amiodarone  Will get TSH Currently with supratherapeutic INR-pharmacy to manage warfarin  Acute renal failure superimposed on stage 3b chronic kidney disease (HCC) Second to severe sepsis versus low output and low flow state Monitor for improvement with above treatment Renal consult if worsening  Traumatic rhabdomyolysis (HCC) Accidental fall from bed-presumed syncope Patient does not know the circumstances of fall that she was found facedown-presuming he syncopized   Chronic HFrEF (heart failure with reduced ejection fraction) (HCC) NICM, EF 20 to 25%-declined AICD in the past Edema lower extremities but otherwise appears to be breathing easily Holding blood pressure lowering GDMT due to soft BPs and in the setting of sepsis  Supratherapeutic  INR Being reversed with vitamin K  due to concern for fluid collection around C-spine   Thrombocytopenia (HCC) At baseline  History of colon cancer Neuropathy related to chemotherapy - No acute disuse   DVT prophylaxis: SCD  Consults: Neurosurgery, Dr. Bluford  Advance Care Planning:   Code Status: Full Code   Family Communication: wife at bedside  Disposition Plan: Back to previous home environment  Severity of Illness: The appropriate patient status for this patient is INPATIENT. Inpatient status is judged to be reasonable and necessary in order to provide the required intensity of service to ensure the patient's safety. The patient's presenting symptoms, physical exam findings, and initial radiographic and laboratory data in the context of their chronic comorbidities is felt to place them at high risk for further clinical deterioration. Furthermore, it is not anticipated that the patient will be medically stable for discharge from the hospital within 2 midnights of admission.   * I certify that at the point of admission it is my clinical judgment that the patient will require inpatient hospital care spanning beyond 2 midnights from the point of admission due to high intensity of service, high risk for further deterioration and high frequency of surveillance required.*  CRITICAL CARE Performed by: Delayne LULLA Solian   Total critical care time: 100 minutes  Critical care time was exclusive of separately billable procedures and treating other patients.  Critical care was necessary to treat or prevent imminent or life-threatening deterioration.  Critical care was time spent personally by me on the following activities: development of treatment plan with patient and/or surrogate as well as nursing, discussions with consultants, evaluation of patient's response to treatment, examination of patient, obtaining history from patient or surrogate, ordering and performing treatments and  interventions, ordering and review of laboratory studies, ordering and review of radiographic studies, pulse oximetry and re-evaluation of patient's condition.  Author: Delayne LULLA Solian, MD 05/03/2023 10:12 PM  For on call review [email or web address].

## 2023-05-03 NOTE — ED Triage Notes (Signed)
 Arrives from home via [facility]. Per report, wife left patient this moring, patient was in bed. When she returned patient was on floor, face down.  Patient does not remember getting out of bed or fallin out of bed. STates he went to bed last night at around 2100, 2130 and woke up on the floor at around 1000.  AAOx3.  Skin warm and dry. Patient also c/o bilateral leg weakness x 1 day.  Equal facial movement and sensation

## 2023-05-03 NOTE — ED Notes (Signed)
 C-collar in place per provider.

## 2023-05-03 NOTE — ED Notes (Signed)
 Patient transported to X-ray

## 2023-05-03 NOTE — ED Notes (Signed)
C-collar applied per MD. 

## 2023-05-03 NOTE — Assessment & Plan Note (Signed)
 Heart rate in the 120s to 130s, likely physiologic BP soft, not supporting heart rate control agents Will trial digoxin and if not improving will start amiodarone Will get TSH Currently with supratherapeutic INR-pharmacy to manage warfarin

## 2023-05-03 NOTE — Progress Notes (Signed)
 Pharmacy Antibiotic Note  Christopher Schar. is a 78 y.o. male admitted on 05/03/2023 with osteomyelitis.  Pharmacy has been consulted for Vancomycin, Cefepime dosing.  Pt is AKI on CKD, will dose Vanc by levels until renal function improves.   Plan: Cefepime 2 gm IV X 1 given in ED on 3/28 @ 1934. Cefepime 2 gm IV Q24H ordered to start on 3/29 @ 1930.  Vancomycin 1 gm IV X 1 given in ED on 3/28 @ 2111.  Additional Vanc 750 mg IV X 1 ordered to make total loading dose of 1750 mg. - Will check Vanc level ~ 24 hrs from initial dose - Vanc random on 3/29 @ 2100.   Height: 5\' 9"  (175.3 cm) Weight: 75.4 kg (166 lb 3.6 oz) IBW/kg (Calculated) : 70.7  Temp (24hrs), Avg:98 F (36.7 C), Min:97.9 F (36.6 C), Max:98 F (36.7 C)  Recent Labs  Lab 05/03/23 1630 05/03/23 1727 05/03/23 1914  WBC 14.7*  --   --   CREATININE 2.89*  --   --   LATICACIDVEN  --  4.4* >9.0*    Estimated Creatinine Clearance: 21.7 mL/min (A) (by C-G formula based on SCr of 2.89 mg/dL (H)).    Allergies  Allergen Reactions   Shellfish Allergy Other (See Comments) and Hives    Pt. instructed by MD to avoid seafood    Antimicrobials this admission:   >>    >>   Dose adjustments this admission:   Microbiology results:  BCx:   UCx:    Sputum:    MRSA PCR:   Thank you for allowing pharmacy to be a part of this patient's care.  Lititia Sen D 05/03/2023 11:15 PM

## 2023-05-03 NOTE — Assessment & Plan Note (Addendum)
 Continue above antibiotics SLP consult Aspiration precautions Bedside swallow eval  DuoNebs as needed Guaifenesin Incentive spirometer Supplemental oxygen if needed

## 2023-05-04 ENCOUNTER — Encounter: Payer: Self-pay | Admitting: Internal Medicine

## 2023-05-04 ENCOUNTER — Inpatient Hospital Stay

## 2023-05-04 DIAGNOSIS — R6520 Severe sepsis without septic shock: Secondary | ICD-10-CM | POA: Diagnosis not present

## 2023-05-04 DIAGNOSIS — A419 Sepsis, unspecified organism: Secondary | ICD-10-CM

## 2023-05-04 DIAGNOSIS — I2510 Atherosclerotic heart disease of native coronary artery without angina pectoris: Secondary | ICD-10-CM | POA: Diagnosis not present

## 2023-05-04 DIAGNOSIS — I4891 Unspecified atrial fibrillation: Secondary | ICD-10-CM

## 2023-05-04 DIAGNOSIS — I5042 Chronic combined systolic (congestive) and diastolic (congestive) heart failure: Secondary | ICD-10-CM

## 2023-05-04 DIAGNOSIS — Z9861 Coronary angioplasty status: Secondary | ICD-10-CM

## 2023-05-04 LAB — COMPREHENSIVE METABOLIC PANEL WITH GFR
ALT: 188 U/L — ABNORMAL HIGH (ref 0–44)
AST: 187 U/L — ABNORMAL HIGH (ref 15–41)
Albumin: 2 g/dL — ABNORMAL LOW (ref 3.5–5.0)
Alkaline Phosphatase: 62 U/L (ref 38–126)
Anion gap: 11 (ref 5–15)
BUN: 72 mg/dL — ABNORMAL HIGH (ref 8–23)
CO2: 21 mmol/L — ABNORMAL LOW (ref 22–32)
Calcium: 7.9 mg/dL — ABNORMAL LOW (ref 8.9–10.3)
Chloride: 109 mmol/L (ref 98–111)
Creatinine, Ser: 2.39 mg/dL — ABNORMAL HIGH (ref 0.61–1.24)
GFR, Estimated: 27 mL/min — ABNORMAL LOW (ref >60)
Glucose, Bld: 204 mg/dL — ABNORMAL HIGH (ref 70–99)
Potassium: 3.8 mmol/L (ref 3.5–5.1)
Sodium: 141 mmol/L (ref 135–145)
Total Bilirubin: 2.9 mg/dL — ABNORMAL HIGH (ref 0.0–1.2)
Total Protein: 4.5 g/dL — ABNORMAL LOW (ref 6.5–8.1)

## 2023-05-04 LAB — CBC
HCT: 38.7 % — ABNORMAL LOW (ref 39.0–52.0)
Hemoglobin: 12.5 g/dL — ABNORMAL LOW (ref 13.0–17.0)
MCH: 33 pg (ref 26.0–34.0)
MCHC: 32.3 g/dL (ref 30.0–36.0)
MCV: 102.1 fL — ABNORMAL HIGH (ref 80.0–100.0)
Platelets: 100 10*3/uL — ABNORMAL LOW (ref 150–400)
RBC: 3.79 MIL/uL — ABNORMAL LOW (ref 4.22–5.81)
RDW: 17.1 % — ABNORMAL HIGH (ref 11.5–15.5)
WBC: 11.4 10*3/uL — ABNORMAL HIGH (ref 4.0–10.5)
nRBC: 0.4 % — ABNORMAL HIGH (ref 0.0–0.2)

## 2023-05-04 LAB — BLOOD CULTURE ID PANEL (REFLEXED) - BCID2

## 2023-05-04 LAB — PROTIME-INR
INR: 7.2 (ref 0.8–1.2)
Prothrombin Time: 62 s — ABNORMAL HIGH (ref 11.4–15.2)

## 2023-05-04 LAB — CK: Total CK: 1619 U/L — ABNORMAL HIGH (ref 49–397)

## 2023-05-04 LAB — MRSA NEXT GEN BY PCR, NASAL: MRSA by PCR Next Gen: DETECTED — AB

## 2023-05-04 LAB — GLUCOSE, CAPILLARY: Glucose-Capillary: 189 mg/dL — ABNORMAL HIGH (ref 70–99)

## 2023-05-04 LAB — LACTIC ACID, PLASMA
Lactic Acid, Venous: 2.2 mmol/L (ref 0.5–1.9)
Lactic Acid, Venous: 2.3 mmol/L (ref 0.5–1.9)

## 2023-05-04 MED ORDER — CHLORHEXIDINE GLUCONATE CLOTH 2 % EX PADS
6.0000 | MEDICATED_PAD | Freq: Every day | CUTANEOUS | Status: DC
Start: 2023-05-04 — End: 2023-05-14
  Administered 2023-05-04 – 2023-05-14 (×11): 6 via TOPICAL

## 2023-05-04 MED ORDER — ORAL CARE MOUTH RINSE
15.0000 mL | OROMUCOSAL | Status: DC
  Administered 2023-05-04 – 2023-05-14 (×28): 15 mL via OROMUCOSAL
  Filled 2023-05-04: qty 15

## 2023-05-04 MED ORDER — ORAL CARE MOUTH RINSE: 15.0000 mL | OROMUCOSAL | Status: DC | PRN

## 2023-05-04 MED ORDER — ALUM & MAG HYDROXIDE-SIMETH 200-200-20 MG/5ML PO SUSP
30.0000 mL | ORAL | Status: DC | PRN
  Administered 2023-05-04 – 2023-05-05 (×3): 30 mL via ORAL
  Filled 2023-05-04 (×3): qty 30

## 2023-05-04 MED ORDER — VITAMIN K1 10 MG/ML IJ SOLN
1.0000 mg | Freq: Once | INTRAVENOUS | Status: DC
  Filled 2023-05-04: qty 0.1

## 2023-05-04 MED ORDER — VITAMIN K1 10 MG/ML IJ SOLN
10.0000 mg | Freq: Once | INTRAVENOUS | Status: AC
  Administered 2023-05-04: 10 mg via INTRAVENOUS
  Filled 2023-05-04: qty 1

## 2023-05-04 MED ORDER — MUPIROCIN 2 % EX OINT
TOPICAL_OINTMENT | Freq: Two times a day (BID) | CUTANEOUS | Status: DC
  Administered 2023-05-06 – 2023-05-14 (×4): 1 via NASAL
  Filled 2023-05-04 (×2): qty 22

## 2023-05-04 MED ORDER — CEFAZOLIN SODIUM-DEXTROSE 2-4 GM/100ML-% IV SOLN
2.0000 g | Freq: Three times a day (TID) | INTRAVENOUS | Status: DC
  Administered 2023-05-04 – 2023-05-05 (×3): 2 g via INTRAVENOUS
  Filled 2023-05-04 (×5): qty 100

## 2023-05-04 NOTE — Progress Notes (Signed)
 PRN Maalox ordered from standing orders for patient c/o indigestion/heartburn. RN administered to patient. Will continue to monitor.

## 2023-05-04 NOTE — Consult Note (Signed)
 [facility] GI Inpatient Consult Note   Jamey Reas, M.D.  Reason for Consult:  Elevated liver enzymes, hyperbilirubinemia   Attending Requesting Consult: Lindajo Royal, M.D.  History of Present Illness: [name]. is a 78 y.o. male with a history of congestive heart failure, atrial fibrillation on warfarin, colon cancer and history of CAD with intracoronary stent presented to the emergency room yesterday evening after being found down passed out by his wife lying next to their bed.  Patient's does not recollect being dizzy or passing out although this was apparently reported.  Patient has been diagnosed with sepsis with presumed focus of infection being possibly pneumonia and/or possible osteomyelitis in the C-spine. The GI service has been called for elevated liver associated enzymes with transaminasemia as well as hyperbilirubinemia.  On the day of consultation, these enzymes have markedly reduced, trending towards normal.  Total bilirubin has decreased from 4.1-2.9.  AST and ALT have both improved from 301 and 282 down to 187 and 188, respectively.  The patient denied previously or currently having any abdominal pain, jaundice, pruritus, alcohol use or toxin exposures. Patient currently grades his neck pain at about "4 out of 10".  He he, again, denies any abdominal pain, nausea or vomiting. Patient denies any dark urine.   Past Medical History:  Past Medical History:  Diagnosis Date   Adenocarcinoma of colon (HCC) 06/19/2013   a. moderately differentiated stage IIIc (T4bN2a M0) adenocarcinoma of colon  s/p colectomy followed by chemoRx with oxaliplatin & fluorouacil.   Anemia    CAD (coronary artery disease)    a. 02/2015 Cath: LM nl, LAD 50/40 mid/distal, LCX 80p, RCA 40p, 30d, RPL1 40, CO 3.27, CI 1.65; b. 06/09/15 PCI: LM minor irregs, m-dLAD 50%, dLAD 40%, pLCx 80% (s/p PCI/DES 0%), pRCA 40%, dRCA 30%, 1st RPLB 40%   Chronic kidney disease, stage 3a (HCC)     Chronic systolic CHF (congestive heart failure) (HCC)    a. 02/2015 Echo: EF 20-25%, diff HK, mod MR, mildy dil LA, mildly dil RV w/ mod RV syst dysfxn, mildly dil RA, mod TR, PASP ; b. 09/2017 Echo: EF 40-45%, diff HK. Gr1 DD. Mild to mod MR. Mildly to mod dil LA; c. 09/2019 Echo: EF 25-30%, glob HK, mod reduced RV fxn, sev elev PASP. Mod dil LA. Mild-mod MR; d. 05/2021 Echo: EF 20-25%, glob HK, sev red RV fxn, sev BAE, sev MR/TR/TS, triv AI.   Diabetes mellitus without complication (HCC)    Essential hypertension    Hypertensive heart disease    Left inguinal hernia 05/2020   Mixed Ischemic and Non-ischemic Cardiomyopathy    a. 02/2015 EF 20-25%, diff HK; b. 09/2016 EF 40-45%; c. 09/2019 EF 25-30%; d. 05/2021 Echo: EF 20-25%, glob HK.   Persistent atrial fibrillation (HCC)    a. Dx 05/2021-->CHA2DS2VASc = 5-->eliquis.   Pulmonary hypertension (HCC)    Severe mitral regurgitation    a. 09/2017 Echo: Mild to mod MR; b. 09/2019 Echo: mild-mod MR; c. 05/2021 Echo: Sev MR/TR/TS.   Tubular adenoma of colon     Problem List: Patient Active Problem List   Diagnosis Date Noted   Severe sepsis (HCC) 05/03/2023   Traumatic rhabdomyolysis (HCC) 05/03/2023   Accidental fall from bed, initial encounter 05/03/2023   Supratherapeutic INR 05/03/2023   Elevated troponin 05/03/2023   Abnormal LFTs--possible biliary tract disease 05/03/2023   Pneumonia, possible 05/03/2023   Syncope, presumed 05/03/2023   Acute ST elevation myocardial infarction (STEMI)  of inferior wall (HCC) 05/03/2023   Abnormal MRI, cervical spine 05/03/2023   Right shoulder pain 10/23/2022   Hyperkalemia 09/18/2022   Allergic rhinitis 05/25/2022   Atherosclerosis of native arteries of extremity with intermittent claudication (HCC) 03/21/2022   Long term (current) use of anticoagulants 10/25/2021   Rash in adult 10/15/2021   Atrial fibrillation with rapid ventricular response (HCC) 05/29/2021   Hydrocele, left 11/01/2020   Status  post laparoscopic hernia repair 09/27/2020   Anemia in chronic kidney disease 04/08/2019   Stage 3a chronic kidney disease (HCC) 04/08/2019   Hyperlipidemia 04/28/2018   Blistering of skin 01/23/2018   Leg wound, left, initial encounter 01/02/2018   Absent pedal pulses 01/02/2018   Onychomycosis 01/02/2018   Leukopenia 09/28/2017   Thrombocytopenia (HCC) 09/28/2017   B12 deficiency 09/28/2017   Renal mass 09/10/2017   Diabetes (HCC) 09/10/2017   Leg wound, right, initial encounter 09/10/2017   Acute renal failure superimposed on stage 3b chronic kidney disease (HCC) 08/14/2017   Anemia 09/12/2015   CAD S/P percutaneous coronary angioplasty    Hypertensive heart disease    CAD (coronary artery disease)    Mixed Ischemic and Non-ischemic Cardiomyopathy    Chronic HFrEF (heart failure with reduced ejection fraction) (HCC)    Bilateral lower extremity edema 02/10/2015   Abdominal pain 02/10/2015   Cough 02/10/2015   Neuropathy (HCC) 09/16/2014   Tubular adenoma of colon 07/20/2014   History of colon cancer 07/20/2014   Benign colon polyp 07/20/2014   Hypertension 07/20/2014   Hyperplastic colon polyp 07/20/2014    Past Surgical History: Past Surgical History:  Procedure Laterality Date   APPENDECTOMY N/A 06/19/2017   Location: [facility]; Surgeon: Claude Manges, MD   CARDIAC CATHETERIZATION Bilateral 02/24/2015   Procedure: Right/Left Heart Cath and Coronary Angiography;  Surgeon: Iran Ouch, MD;  Location: [facility] INVASIVE CV LAB;  Service: Cardiovascular;  Laterality: Bilateral;   CARDIAC CATHETERIZATION N/A 06/09/2015   Procedure: Coronary Stent Intervention (3.0 x 12 mm Xience Alpine DES to LCx);  Surgeon: Iran Ouch, MD;  Location: [facility] INVASIVE CV LAB;  Service: Cardiovascular;  Laterality: N/A   COLECTOMY  06/19/2017   Descending and proximal sigmoid colectomy, LEFT ureterolysis, partial cecectomy, appendectomy; Location: [facility]; Surgeon: Claude Manges, MD    COLONOSCOPY     COLONOSCOPY WITH PROPOFOL N/A 12/11/2016   Procedure: COLONOSCOPY WITH PROPOFOL;  Surgeon: Christena Deem, MD;  Location: [facility] ENDOSCOPY;  Service: Endoscopy;  Laterality: N/A;   ESOPHAGOGASTRODUODENOSCOPY     HERNIA REPAIR     PORT-A-CATH REMOVAL N/A 07/12/2014   Procedure: REMOVAL PORT-A-CATH;  Surgeon: Duwaine Maxin, MD;  Location: [facility] ORS;  Service: General;  Laterality: N/A;   PORTACATH PLACEMENT Left 2015    Allergies: Allergies  Allergen Reactions   Shellfish Allergy Other (See Comments) and Hives    Pt. instructed by MD to avoid seafood    Home Medications: Medications Prior to Admission  Medication Sig Dispense Refill Last Dose/Taking   atorvastatin (LIPITOR) 40 MG tablet TAKE 1 TABLET(40 MG) BY MOUTH DAILY 90 tablet 3 Unknown   carvedilol (COREG) 3.125 MG tablet TAKE 1 TABLET BY MOUTH 2 TIMES DAILY WITH A MEAL 180 tablet 2 Unknown   cholecalciferol (VITAMIN D3) 25 MCG (1000 UNIT) tablet Take 1,000 Units by mouth daily.   Unknown   FARXIGA 10 MG TABS tablet TAKE 1 TABLET(10 MG) BY MOUTH DAILY BEFORE BREAKFAST 30 tablet 6 Unknown   ferrous sulfate 325 (65 FE) MG tablet Take 325  mg by mouth daily with breakfast.   Unknown   gabapentin (NEURONTIN) 100 MG capsule Take 100 mg by mouth 3 (three) times daily. Taking 2 -100 mg pills in am and 1 -100 mg at night   Unknown   loratadine (CLARITIN) 10 MG tablet TAKE 1 TABLET(10 MG) BY MOUTH DAILY 30 tablet 11 Unknown   losartan (COZAAR) 100 MG tablet Take 1 tablet (100 mg total) by mouth daily. 30 tablet 6 Unknown   Magnesium Oxide 400 MG CAPS Take 1 capsule (400 mg total) by mouth daily. 90 capsule 3 Unknown   spironolactone (ALDACTONE) 25 MG tablet TAKE 1 TABLET(25 MG) BY MOUTH DAILY 30 tablet 5 Unknown   torsemide (DEMADEX) 20 MG tablet Take 1 tablet (20 mg total) by mouth daily. 30 tablet 11 Unknown   warfarin (COUMADIN) 5 MG tablet TAKE 1/2 TO 1 TABLET BY MOUTH EVERY DAY AS DIRECTED BY COUMADIN CLINIC 70 tablet  1 Unknown   amLODipine (NORVASC) 5 MG tablet Take 5 mg by mouth daily. (Patient not taking: Reported on 05/04/2023)   Not Taking   Blood Glucose Monitoring Suppl DEVI 1 each by Does not apply route daily. May substitute to any manufacturer covered by patient's insurance. 1 each 0    Glucose Blood (BLOOD GLUCOSE TEST STRIPS) STRP 1 each by In Vitro route daily as needed. May substitute to any manufacturer covered by patient's insurance. 100 strip 0    Lancet Device MISC 1 each by Does not apply route daily as needed. May substitute to any manufacturer covered by patient's insurance. 1 each 0    Lancets Misc. MISC 1 each by Does not apply route daily as needed. May substitute to any manufacturer covered by patient's insurance. 100 each 0    metFORMIN (GLUCOPHAGE) 500 MG tablet Take 500 mg by mouth 2 (two) times daily with a meal. (Patient not taking: Reported on 05/04/2023)   Not Taking   potassium chloride SA (KLOR-CON M) 20 MEQ tablet TAKE 2 TABLETS(40 MEQ) BY MOUTH DAILY (Patient not taking: Reported on 05/04/2023) 60 tablet 6 Not Taking   Home medication reconciliation was completed with the patient.   Scheduled Inpatient Medications:    Chlorhexidine Gluconate Cloth  6 each Topical Daily   guaiFENesin  600 mg Oral BID   mupirocin ointment   Nasal BID   ondansetron (ZOFRAN) IV  4 mg Intravenous Once   mouth rinse  15 mL Mouth Rinse 4 times per day   vancomycin variable dose per unstable renal function (pharmacist dosing)   Does not apply See admin instructions    Continuous Inpatient Infusions:    ceFEPime (MAXIPIME) IV     lactated ringers 150 mL/hr (05/04/23 0700)   metronidazole Stopped (05/04/23 0157)    PRN Inpatient Medications:  acetaminophen **OR** acetaminophen, albuterol, HYDROcodone-acetaminophen, LORazepam, ondansetron **OR** ondansetron (ZOFRAN) IV, mouth rinse  Family History: family history includes Cancer in his mother; Other in his father.   GI Family History:  Negative  Social History:   reports that he has never smoked. He has never used smokeless tobacco. He reports that he does not currently use alcohol. He reports that he does not use drugs. The patient denies ETOH, tobacco, or drug use.    Review of Systems: Review of Systems - History obtained from the patient General ROS: positive for  - chills negative for - sleep disturbance Psychological ROS: negative Ophthalmic ROS: negative ENT ROS: negative Allergy and Immunology ROS: negative Respiratory ROS: no cough, shortness of  breath, or wheezing Cardiovascular ROS: no chest pain or dyspnea on exertion Genito-Urinary ROS: no dysuria, trouble voiding, or hematuria Musculoskeletal ROS: positive for - joint pain and pain in neck. negative for - Muscle pain Neurological ROS: no TIA or stroke symptoms Dermatological ROS: negative  Physical Examination: BP 91/65   Pulse 72   Temp 97.7 F (36.5 C) (Oral)   Resp 15   Ht 5\' 9"  (1.753 m)   Wt 73.1 kg   SpO2 95%   BMI 23.80 kg/m  Physical Exam Constitutional:      General: He is not in acute distress.    Appearance: He is ill-appearing. He is not diaphoretic.  HENT:     Head: Normocephalic and atraumatic.  Neck:     Thyroid: No thyroid mass.     Comments: Patient is wearing a c-collar Cardiovascular:     Rate and Rhythm: Rhythm irregular.     Chest Wall: PMI is not displaced.     Pulses: Normal pulses.     Heart sounds:     No diastolic murmur is present.  Pulmonary:     Effort: No bradypnea or respiratory distress.     Breath sounds: Normal breath sounds. No stridor or decreased air movement.  Abdominal:     General: Abdomen is flat. Bowel sounds are normal. There is no distension or abdominal bruit.     Palpations: Abdomen is soft. There is no fluid wave.     Tenderness: There is no abdominal tenderness.     Hernia: No hernia is present.  Musculoskeletal:        General: No tenderness.     Cervical back: No erythema,  rigidity or crepitus. Decreased range of motion.     Right lower leg: No edema.     Left lower leg: No edema.  Skin:    General: Skin is warm and dry.  Neurological:     General: No focal deficit present.     Mental Status: He is alert.  Psychiatric:        Mood and Affect: Mood normal.        Behavior: Behavior normal.        Thought Content: Thought content normal.        Judgment: Judgment normal.     Data: Lab Results  Component Value Date   WBC 11.4 (H) 05/04/2023   HGB 12.5 (L) 05/04/2023   HCT 38.7 (L) 05/04/2023   MCV 102.1 (H) 05/04/2023   PLT 100 (L) 05/04/2023   Recent Labs  Lab 05/03/23 1630 05/04/23 0522  HGB 14.3 12.5*   Lab Results  Component Value Date   NA 141 05/04/2023   K 3.8 05/04/2023   CL 109 05/04/2023   CO2 21 (L) 05/04/2023   BUN 72 (H) 05/04/2023   CREATININE 2.39 (H) 05/04/2023   Lab Results  Component Value Date   ALT 188 (H) 05/04/2023   AST 187 (H) 05/04/2023   ALKPHOS 62 05/04/2023   BILITOT 2.9 (H) 05/04/2023   Recent Labs  Lab 05/03/23 1727 05/03/23 2007  APTT  --  41*  INR 5.6*  --       Latest Ref Rng & Units 05/04/2023    5:22 AM 05/03/2023    4:30 PM 08/21/2022   11:42 AM  CBC  WBC 4.0 - 10.5 K/uL 11.4  14.7  3.4   Hemoglobin 13.0 - 17.0 g/dL 46.9  62.9  52.8   Hematocrit 39.0 - 52.0 % 38.7  45.2  33.9   Platelets 150 - 400 K/uL 100  124  101     STUDIES: MR Cervical Spine W and Wo Contrast Result Date: 05/03/2023 CLINICAL DATA:  Neck pain, infection suspected, positive xray/CT. EXAM: MRI CERVICAL SPINE WITHOUT AND WITH CONTRAST TECHNIQUE: Multiplanar and multiecho pulse sequences of the cervical spine, to include the craniocervical junction and cervicothoracic junction, were obtained without and with intravenous contrast. CONTRAST:  7.42mL GADAVIST GADOBUTROL 1 MMOL/ML IV SOLN COMPARISON:  None Available. FINDINGS: Alignment: Straightening.  No substantial sagittal subluxation. Vertebrae: Edema within the disc at  C4-C5 without substantial adjacent marrow edema. Please see same day CT of the cervical spine for better evaluation of osseous bony detail. Cord: Normal cord signal. Posterior Fossa, vertebral arteries, paraspinal tissues: Large volume prevertebral edema extending from the craniocervical junction to the lower cervical levels. Possible thinning versus destruction of the posterior longitudinal ligament at C4-C5 (series 7, image 9). Otherwise, no evidence of ligamentous injury. Disc levels: Thin epidural fluid collection which extends circumferentially from C4-C5 inferiorly into the visualized upper thoracic canal. C2-C3: Bilateral facet and uncovertebral hypertrophy with moderate left foraminal stenosis. Mild canal stenosis. C3-C4: Posterior disc osteophyte complex with bilateral facet uncovertebral hypertrophy. Resulting moderate to severe bilateral foraminal stenosis and mild canal stenosis. C4-C5: Posterior disc osteophyte complex with bilateral facet uncovertebral hypertrophy. Resulting severe bilateral foraminal stenosis and moderate canal stenosis. C5-C6: Posterior disc osteophyte complex with bilateral facet uncovertebral hypertrophy. Resulting severe bilateral foraminal stenosis and mild to moderate canal stenosis. C6-C7: Posterior disc osteophyte complex with bilateral facet uncovertebral hypertrophy. Resulting moderate to severe bilateral foraminal stenosis and mild canal stenosis. C7-T1: Bilateral facet uncovertebral hypertrophy without significant stenosis. IMPRESSION: 1. Large volume prevertebral edema in the upper cervical spine with edema in the C4-C5 disc, possible thinning versus disruption of the posterior longitudinal ligament at this level, and thin epidural fluid collection extending from this level inferiorly into the visualized upper thoracic spine. Findings could be either traumatic or infectious in etiology. A short interval follow-up MRI could assess for change if clinically warranted. 2.  Superimposed severe multilevel degenerative change, detailed above and including severe bilateral foraminal stenosis C4-C5 and C5-C6, moderate canal stenosis at C4-C5 and mild-to-moderate canal stenosis C5-C6. Findings discussed with Dr. Larinda Buttery via telephone at 9:45 p.m. Electronically Signed   By: Feliberto Harts M.D.   On: 05/03/2023 22:00   CT Head Wo Contrast Result Date: 05/03/2023 CLINICAL DATA:  Larey Seat out of bed.  Found on the floor. EXAM: CT HEAD WITHOUT CONTRAST CT CERVICAL SPINE WITHOUT CONTRAST TECHNIQUE: Multidetector CT imaging of the head and cervical spine was performed following the standard protocol without intravenous contrast. Multiplanar CT image reconstructions of the cervical spine were also generated. RADIATION DOSE REDUCTION: This exam was performed according to the departmental dose-optimization program which includes automated exposure control, adjustment of the mA and/or kV according to patient size and/or use of iterative reconstruction technique. COMPARISON:  None Available. FINDINGS: CT HEAD FINDINGS Brain: No evidence of acute infarction, hemorrhage, hydrocephalus, extra-axial collection or mass lesion/mass effect. Atrophy and chronic microvascular ischemic changes. Vascular: Calcified atherosclerosis at the skull base. No hyperdense vessel. Skull: Normal. Negative for fracture or focal lesion. Sinuses/Orbits: No acute finding. Other: None. CT CERVICAL SPINE FINDINGS Alignment: Reversal of the normal cervical lordosis. No traumatic malalignment. Skull base and vertebrae: No acute fracture. No primary bone lesion or focal pathologic process. Mild irregularity of the C4-C5 and C6-C7 endplates. Soft tissues and spinal canal: Prevertebral fluid and swelling  extending from C2-C6 (series 4, image 44). No visible canal hematoma. Disc levels: Multilevel degenerative changes, moderate from C4-C5 through C6-C7. Upper chest: Negative. Other: None. IMPRESSION: Head: 1. No acute intracranial  abnormality. Atrophy and chronic microvascular ischemic changes. Cervical spine: 1. No acute cervical spine fracture or traumatic listhesis. 2. Prevertebral fluid and swelling extending from C2-C6, concerning for ligamentous injury. MRI cervical spine is recommended. 3. Irregularity of the C4-C5 and C6-C7 endplates is likely degenerative, but given prevertebral fluid and leukocytosis, attention on follow-up MRI is recommended to exclude osteomyelitis-discitis. Electronically Signed   By: Obie Dredge M.D.   On: 05/03/2023 18:09   CT Cervical Spine Wo Contrast Result Date: 05/03/2023 CLINICAL DATA:  Larey Seat out of bed.  Found on the floor. EXAM: CT HEAD WITHOUT CONTRAST CT CERVICAL SPINE WITHOUT CONTRAST TECHNIQUE: Multidetector CT imaging of the head and cervical spine was performed following the standard protocol without intravenous contrast. Multiplanar CT image reconstructions of the cervical spine were also generated. RADIATION DOSE REDUCTION: This exam was performed according to the departmental dose-optimization program which includes automated exposure control, adjustment of the mA and/or kV according to patient size and/or use of iterative reconstruction technique. COMPARISON:  None Available. FINDINGS: CT HEAD FINDINGS Brain: No evidence of acute infarction, hemorrhage, hydrocephalus, extra-axial collection or mass lesion/mass effect. Atrophy and chronic microvascular ischemic changes. Vascular: Calcified atherosclerosis at the skull base. No hyperdense vessel. Skull: Normal. Negative for fracture or focal lesion. Sinuses/Orbits: No acute finding. Other: None. CT CERVICAL SPINE FINDINGS Alignment: Reversal of the normal cervical lordosis. No traumatic malalignment. Skull base and vertebrae: No acute fracture. No primary bone lesion or focal pathologic process. Mild irregularity of the C4-C5 and C6-C7 endplates. Soft tissues and spinal canal: Prevertebral fluid and swelling extending from C2-C6 (series 4,  image 44). No visible canal hematoma. Disc levels: Multilevel degenerative changes, moderate from C4-C5 through C6-C7. Upper chest: Negative. Other: None. IMPRESSION: Head: 1. No acute intracranial abnormality. Atrophy and chronic microvascular ischemic changes. Cervical spine: 1. No acute cervical spine fracture or traumatic listhesis. 2. Prevertebral fluid and swelling extending from C2-C6, concerning for ligamentous injury. MRI cervical spine is recommended. 3. Irregularity of the C4-C5 and C6-C7 endplates is likely degenerative, but given prevertebral fluid and leukocytosis, attention on follow-up MRI is recommended to exclude osteomyelitis-discitis. Electronically Signed   By: Obie Dredge M.D.   On: 05/03/2023 18:09   DG Shoulder Right Result Date: 05/03/2023 CLINICAL DATA:  Fall and right shoulder pain. EXAM: RIGHT SHOULDER - 2+ VIEW COMPARISON:  None available. FINDINGS: There is no acute fracture or dislocation. Degenerative changes of the right AC joint and mild spurring of the bony glenoid. The soft tissues are unremarkable. IMPRESSION: 1. No acute fracture or dislocation. 2. Degenerative changes. Electronically Signed   By: Elgie Collard M.D.   On: 05/03/2023 18:09   CT ABDOMEN PELVIS WO CONTRAST Result Date: 05/03/2023 CLINICAL DATA:  Unwitnessed fall.  History of colon cancer. EXAM: CT ABDOMEN AND PELVIS WITHOUT CONTRAST TECHNIQUE: Multidetector CT imaging of the abdomen and pelvis was performed following the standard protocol without IV contrast. RADIATION DOSE REDUCTION: This exam was performed according to the departmental dose-optimization program which includes automated exposure control, adjustment of the mA and/or kV according to patient size and/or use of iterative reconstruction technique. COMPARISON:  May 11, 2021. FINDINGS: Lower chest: Minimal left pleural effusion is noted with adjacent subsegmental atelectasis. Hepatobiliary: No focal liver abnormality is seen. No gallstones,  gallbladder wall thickening, or biliary dilatation.  Pancreas: Unremarkable. No pancreatic ductal dilatation or surrounding inflammatory changes. Spleen: Normal in size without focal abnormality. Adrenals/Urinary Tract: Adrenal glands appear normal. Stable bilateral renal cysts are noted for which no further follow-up is required. No hydronephrosis or renal obstruction is noted. Urinary bladder is unremarkable. Stomach/Bowel: Stomach is unremarkable. Status post appendectomy. There is no evidence of bowel obstruction or inflammation. Vascular/Lymphatic: Aortic atherosclerosis. No enlarged abdominal or pelvic lymph nodes. Reproductive: Prostate is unremarkable. Other: No definite hernia is noted. Minimal free fluid is noted in the pelvis and pericolic gutters suggesting ascites. Musculoskeletal: No acute or significant osseous findings. IMPRESSION: Minimal left pleural effusion with minimal adjacent subsegmental atelectasis. Minimal ascites is noted. No other acute abnormality seen in the abdomen or pelvis. Aortic Atherosclerosis (ICD10-I70.0). Electronically Signed   By: Lupita Raider M.D.   On: 05/03/2023 17:59   DG Chest 2 View Result Date: 05/03/2023 CLINICAL DATA:  Weakness, sepsis.  Found down. EXAM: CHEST - 2 VIEW COMPARISON:  Abdominopelvic CT same date. Chest radiographs 06/03/2021 and 05/29/2021. Chest CT 09/20/2020. FINDINGS: Stable cardiomegaly and aortic atherosclerosis. Persistent left pleural effusion with left lower lobe consolidation, similar to prior chest radiographs. The right lung is clear. No evidence of edema or pneumothorax. The bones appear unchanged. IMPRESSION: Persistent left pleural effusion with left lower lobe consolidation, similar to prior chest radiographs and likely reflecting atelectasis or scarring. Left lower lobe pneumonia not excluded. No edema. Electronically Signed   By: Carey Bullocks M.D.   On: 05/03/2023 17:47   @IMAGES @  Assessment:  1.  Elevated liver  associated enzymes-likely secondary to #2.  Imaging studies revealed no evidence of extrahepatic biliary obstruction or dilated bile ducts.  No cholelithiasis was noted and alkaline phosphatase was normal.  No localizing symptoms to suggest the hepatobiliary system as a cause of elevation. 2.  Sepsis syndrome-patient currently hemodynamically stable.  IV antibiotics have been instituted and gentle IV hydration started. 3.  Neck trauma versus osteomyelitis-MRI was abnormal and neurosurgical consult is currently pending.     Recommendations: 1.  I  I agree with all current treatment. 2.  I do not advise any further workup for potential hepatobiliary disease given high likelihood these improving elevation of liver associated enzymes is related to sepsis. 3.  GI will sign off for now.  Call back if needed.  Thank you for the consult. Please call with questions or concerns.  Rosina Lowenstein, "Mellody Dance MD [facility] [address] [place], [place] [address] [phone]  05/04/2023 8:43 AM

## 2023-05-04 NOTE — Assessment & Plan Note (Signed)
 Being reversed with vitamin K due to concern for fluid collection around C-spine

## 2023-05-04 NOTE — Progress Notes (Signed)
 Progress Note   Patient: [name]. RUE:[id number] DOB: [birth year] DOA: 05/03/2023     1 DOS: the patient was seen and examined on 05/04/2023   Brief hospital course: From HPI: [name]. is a 78 y.o. male with medical history significant for CAD s/p stenting, chronic HFrEF secondary to NICM (EF 20 to 25% 1/17), persistent A-fib on warfarin due to cost of Eliquis, HTN, HLD, CKD 3B, and colon cancer 2015, who is being admitted with multiple acute derangements, related to a fall off his bed with no recollection of the circumstances.  Per wife at bedside, she left patient in bed this morning and when she returned home at 10 AM patient was lying on the floor on his stomach with his head turned to the side.  She states he was awake and alert.  He was unable to get up.  At baseline he does not walk with a cane or walker and typically he would have been able to get up on his own. ED course and data review: Tachycardic to 121 with soft blood pressure of 105/75. Respiratory viral panel negative for COVID flu and RSV CT abdomen and pelvis with no acute abnormalities Chest x-ray showing left lower lobe consolidation likely atelectasis, pneumonia not ruled out CT C-spine nontraumatic but concerning for prevertebral fluid with recommendations for follow-up MRI to evaluate for osteomyelitis discitis. TRH showing therefore contacted for admission "     Assessment and Plan:  Severe sepsis (HCC) Severe sepsis versus cardiogenic Possible pneumonia Possible discitis MSSA bacteremia MRI showed large volume prevertebral edema in the advanced cervical spine around C4-C5 Severe sepsis criteria include tachycardia, leukocytosis with lactic acidosis, AKI and hyperbilirubinemia Blood culture growing MSSA Antibiotics have been adjusted according to ID pharmacy Continue to follow-up culture results. Infectious disease on board and case discussed     Pneumonia, possible Continue current  antibiotics Wean off oxygen as tolerated    Abnormal MRI, cervical spine Paravertebral C-spine fluid collection/edema --possible osteomyelitis/discitis Chronic baseline neck pain Patient was seen by currently there is no drainable fluid in the cervical area  We will continue to monitor  Neurosurgery on board     Syncope, presumed Follow-up on echocardiogram Continue telemetry monitoring   Abnormal LFTs--possible biliary tract disease Elevated lipase Abnormal LFTs suspect related to hepatic congestion from low output cardiac failure versus severe sepsis Patient has been seen by gastroenterologist with no recommendation Abdominal ultrasound reviewed showing cholelithiasis with no evidence of acute cholecystitis   CAD S/P percutaneous coronary angioplasty Elevated troponin Troponin 340 but suspecting demand ischemia Cardiologist on board Continue statin therapy   Atrial fibrillation with rapid ventricular response (HCC) Heart rate in the 120s to 130s, likely physiologic BP soft, not supporting heart rate control agents Cardiologist on board and case discussed Currently with supratherapeutic INR-pharmacy to manage warfarin   Acute renal failure superimposed on stage 3b chronic kidney disease (HCC) Second to severe sepsis versus low output and low flow state Monitor for improvement with above treatment Renal consult if worsening   Traumatic rhabdomyolysis (HCC) Accidental fall from bed-presumed syncope Patient does not know the circumstances of fall that she was found facedown-presuming he syncopized     Chronic HFrEF (heart failure with reduced ejection fraction) (HCC) NICM, EF 20 to 25%-declined AICD in the past Edema lower extremities but otherwise appears to be breathing easily Holding blood pressure lowering GDMT due to soft BPs and in the setting of sepsis   Supratherapeutic INR Patient received vitamin  K     Thrombocytopenia (HCC) Monitor platelet closely    History of colon cancer Neuropathy related to chemotherapy - No acute disuse     DVT prophylaxis: SCD   Consults: Neurosurgery, dimensional radiology, cardiology   Advance Care Planning:   Code Status: Full Code    Family Communication: wife at bedside   Disposition Plan: Back to previous home environment  Subjective:  Patient seen and examined at bedside this morning Denies nausea vomiting abdominal pain chest pain or cough  Physical Exam:  Constitutional:      General: He is not in acute distress.    Interventions: Cervical collar in place.     Comments: Patient awake and alert, in c-collar  HENT:     Head: Normocephalic and atraumatic.  Cardiovascular:     Rate and Rhythm: Irregular    Heart sounds: No murmur heard.    No systolic murmur is present.     No diastolic murmur is present.  Pulmonary:     Effort: Pulmonary effort is normal.     Breath sounds: Normal breath sounds.  Abdominal:     Palpations: Abdomen is soft.     Tenderness: There is no abdominal tenderness.  Musculoskeletal:     Right lower leg: 2+ Edema present.     Left lower leg: 2+ Edema present.  Neurological:     General: No focal deficit present.     Mental Status: Mental status is at baseline  Vitals:   05/04/23 1200 05/04/23 1230 05/04/23 1336 05/04/23 1400  BP: (!) 81/56 91/75 97/63  (!) 103/91  Pulse: 90 (!) 35 93 (!) 106  Resp: 16 14 (!) 28 16  Temp: 97.7 F (36.5 C)     TempSrc: Oral     SpO2: 96% 96% 92% 90%  Weight:      Height:        Data Reviewed:    Latest Ref Rng & Units 05/04/2023    5:22 AM 05/03/2023    4:30 PM 08/21/2022   11:42 AM  CBC  WBC 4.0 - 10.5 K/uL 11.4  14.7  3.4   Hemoglobin 13.0 - 17.0 g/dL 16.1  09.6  04.5   Hematocrit 39.0 - 52.0 % 38.7  45.2  33.9   Platelets 150 - 400 K/uL 100  124  101        Latest Ref Rng & Units 05/04/2023    5:22 AM 05/03/2023    4:30 PM 04/16/2023    3:41 PM  CMP  Glucose 70 - 99 mg/dL 409  811  914   BUN 8 - 23 mg/dL  72  72  27   Creatinine 0.61 - 1.24 mg/dL 7.82  9.56  2.13   Sodium 135 - 145 mmol/L 141  143  143   Potassium 3.5 - 5.1 mmol/L 3.8  3.9  3.4   Chloride 98 - 111 mmol/L 109  103  103   CO2 22 - 32 mmol/L 21  22  24    Calcium 8.9 - 10.3 mg/dL 7.9  8.9  8.6   Total Protein 6.5 - 8.1 g/dL 4.5  6.8    Total Bilirubin 0.0 - 1.2 mg/dL 2.9  4.1    Alkaline Phos 38 - 126 U/L 62  96    AST 15 - 41 U/L 187  301    ALT 0 - 44 U/L 188  282       Disposition: Status is: Inpatient  Time spent: 55 minutes  Author: Loyce Dys, MD 05/04/2023 2:35 PM  For on call review [email or web address].

## 2023-05-04 NOTE — Progress Notes (Signed)
 [place] with Cardiology came by and said that [facility] should be consulted instead. He did not know who is in call. Message sent by RN to MD Djan to request help with clarifying and evaluation within appropriate timeframe.

## 2023-05-04 NOTE — Consult Note (Signed)
 Neurosurgery-New Consultation Evaluation 05/04/2023 [name] [id number]  Identifying Statement: [name]. is a 78 y.o. male from [place] [address] with   Physician Requesting Consultation: No ref. provider found  History of Present Illness: [name] is here for evaluation after being found down.  He reported feeling weak for days before this.  In the emergency department, he was started on treatment for possible sepsis given his vital signs and elevated white count.  Given the fall, he had imaging obtained including the CT and MRI of the cervical spine.  There was concern for possible bleeding versus infection.  Patient today does not report any new weakness or numbness but does have chronic numbness in his legs.  Past Medical History:  Past Medical History:  Diagnosis Date   Adenocarcinoma of colon (HCC) 06/19/2013   a. moderately differentiated stage IIIc (T4bN2a M0) adenocarcinoma of colon  s/p colectomy followed by chemoRx with oxaliplatin & fluorouacil.   Anemia    CAD (coronary artery disease)    a. 02/2015 Cath: LM nl, LAD 50/40 mid/distal, LCX 80p, RCA 40p, 30d, RPL1 40, CO 3.27, CI 1.65; b. 06/09/15 PCI: LM minor irregs, m-dLAD 50%, dLAD 40%, pLCx 80% (s/p PCI/DES 0%), pRCA 40%, dRCA 30%, 1st RPLB 40%   Chronic kidney disease, stage 3a (HCC)    Chronic systolic CHF (congestive heart failure) (HCC)    a. 02/2015 Echo: EF 20-25%, diff HK, mod MR, mildy dil LA, mildly dil RV w/ mod RV syst dysfxn, mildly dil RA, mod TR, PASP ; b. 09/2017 Echo: EF 40-45%, diff HK. Gr1 DD. Mild to mod MR. Mildly to mod dil LA; c. 09/2019 Echo: EF 25-30%, glob HK, mod reduced RV fxn, sev elev PASP. Mod dil LA. Mild-mod MR; d. 05/2021 Echo: EF 20-25%, glob HK, sev red RV fxn, sev BAE, sev MR/TR/TS, triv AI.   Diabetes mellitus without complication (HCC)    Essential hypertension    Hypertensive heart disease    Left inguinal hernia 05/2020   Mixed Ischemic and Non-ischemic  Cardiomyopathy    a. 02/2015 EF 20-25%, diff HK; b. 09/2016 EF 40-45%; c. 09/2019 EF 25-30%; d. 05/2021 Echo: EF 20-25%, glob HK.   Persistent atrial fibrillation (HCC)    a. Dx 05/2021-->CHA2DS2VASc = 5-->eliquis.   Pulmonary hypertension (HCC)    Severe mitral regurgitation    a. 09/2017 Echo: Mild to mod MR; b. 09/2019 Echo: mild-mod MR; c. 05/2021 Echo: Sev MR/TR/TS.   Tubular adenoma of colon     Social History: Social History   Socioeconomic History   Marital status: Married    Spouse name: Carney Bern   Number of children: 4   Years of education: Not on file   Highest education level: Not on file  Occupational History   Occupation: retired  Tobacco Use   Smoking status: Never   Smokeless tobacco: Never  Vaping Use   Vaping status: Never Used  Substance and Sexual Activity   Alcohol use: Not Currently    Comment: none in many years   Drug use: No   Sexual activity: Yes  Other Topics Concern   Not on file  Social History Narrative   Not on file   Social Drivers of Health   Financial Resource Strain: Low Risk  (10/17/2022)   Overall Financial Resource Strain (CARDIA)    Difficulty of Paying Living Expenses: Not hard at all  Food Insecurity: No Food Insecurity (05/04/2023)   Hunger Vital Sign    Worried  About Running Out of Food in the Last Year: Never true    Ran Out of Food in the Last Year: Never true  Transportation Needs: No Transportation Needs (05/04/2023)   PRAPARE - Administrator, Civil Service (Medical): No    Lack of Transportation (Non-Medical): No  Physical Activity: Sufficiently Active (10/17/2022)   Exercise Vital Sign    Days of Exercise per Week: 4 days    Minutes of Exercise per Session: 60 min  Stress: No Stress Concern Present (10/17/2022)   [employer] of Occupational Health - Occupational Stress Questionnaire    Feeling of Stress : Not at all  Social Connections: Moderately Isolated (05/04/2023)   Social Connection and Isolation Panel  [NHANES]    Frequency of Communication with Friends and Family: Never    Frequency of Social Gatherings with Friends and Family: Once a week    Attends Religious Services: More than 4 times per year    Active Member of [employer] or Organizations: No    Attends Banker Meetings: Never    Marital Status: Married  Catering manager Violence: Not At Risk (05/04/2023)   Humiliation, Afraid, Rape, and Kick questionnaire    Fear of Current or Ex-Partner: No    Emotionally Abused: No    Physically Abused: No    Sexually Abused: No     Family History: Family History  Problem Relation Age of Onset   Cancer Mother    Other Father        unknown medical history    Review of Systems:  Review of Systems - General ROS: Negative Psychological ROS: Negative Ophthalmic ROS: Negative ENT ROS: Negative Hematological and Lymphatic ROS: Negative  Endocrine ROS: Negative Respiratory ROS: Negative Cardiovascular ROS: Negative Gastrointestinal ROS: Negative Genito-Urinary ROS: Negative Musculoskeletal ROS: Negative Neurological ROS: Negative Dermatological ROS: Negative  Physical Exam: BP 91/65   Pulse 72   Temp 97.7 F (36.5 C) (Oral)   Resp 15   Ht 5\' 9"  (1.753 m)   Wt 73.1 kg   SpO2 95%   BMI 23.80 kg/m  Body mass index is 23.8 kg/m. Body surface area is 1.89 meters squared. General appearance: Alert, cooperative, in no acute distress Head: Normocephalic, atraumatic Eyes: Normal, EOM intact Oropharynx: Moist without lesions Neck: In cervical collar  Neurologic exam:  Mental status: alertness: alert,  affect: normal Speech: fluent and clear Motor:strength symmetric 5/5 in bilateral upper extremities and lower extremities with exception of 4/5 in interossei Sensory: decreased sensation in bilateral hands and distal legs (chronic) Reflexes: 1+ at bilateral patella Gait: Not tested   Imaging: MRI cervical spine: There is straightening of lordotic curvature.   Anteriorly, there is some STIR signal with at the C4-5 disc base concerning for possible injury.  Posteriorly, ligaments appear intact.  There is no obvious fracture.  There is stenosis seen from C3-C6, there is disc osteophyte complexes but also some fluid collection anteriorly.   Impression/Plan:  [name] is here for evaluation after recent fall.  His exam today is reassuring.  His MRI does show some stenosis which appears more chronic than acute.  However, we cannot rule out possibility of hematoma or infection.  We had recommended they correct his coagulopathy.  He is being treated for infection at this time.  He should remain in the cervical collar but is cleared to work with physical therapy.  If he does have any changes in neurologic symptoms, this may prompt surgical intervention.   1.  Diagnosis: Cervical spinal stenosis, concern for ligamentous injury  2.  Plan -Remain in cervical collar [facility] J), okay to work with physical therapy -Recommend correction of coagulopathy -We will continue to follow

## 2023-05-04 NOTE — Consult Note (Addendum)
 PHARMACY - ANTICOAGULATION CONSULT NOTE  Pharmacy Consult for Warfarin Indication: atrial fibrillation  Allergies  Allergen Reactions   Shellfish Allergy Other (See Comments) and Hives    Pt. instructed by MD to avoid seafood    Patient Measurements: Height: 5\' 9"  (175.3 cm) Weight: 73.1 kg (161 lb 2.5 oz) IBW/kg (Calculated) : 70.7 HEPARIN DW (KG): 73.1  Vital Signs: Temp: 97.7 F (36.5 C) (03/29 0800) Temp Source: Oral (03/29 0800) BP: 91/65 (03/29 0700) Pulse Rate: 72 (03/29 0700)  Labs: Recent Labs    05/03/23 1630 05/03/23 1727 05/03/23 2007 05/03/23 2112 05/04/23 0522  HGB 14.3  --   --   --  12.5*  HCT 45.2  --   --   --  38.7*  PLT 124*  --   --   --  100*  APTT  --   --  41*  --   --   LABPROT  --  51.0*  --   --  62.0*  INR  --  5.6*  --   --  7.2*  CREATININE 2.89*  --   --   --  2.39*  CKTOTAL  --  2,978*  --   --  1,619*  TROPONINIHS  --  342*  --  53*  --     Estimated Creatinine Clearance: 26.3 mL/min (A) (by C-G formula based on SCr of 2.39 mg/dL (H)).   Medical History: Past Medical History:  Diagnosis Date   Adenocarcinoma of colon (HCC) 06/19/2013   a. moderately differentiated stage IIIc (T4bN2a M0) adenocarcinoma of colon  s/p colectomy followed by chemoRx with oxaliplatin & fluorouacil.   Anemia    CAD (coronary artery disease)    a. 02/2015 Cath: LM nl, LAD 50/40 mid/distal, LCX 80p, RCA 40p, 30d, RPL1 40, CO 3.27, CI 1.65; b. 06/09/15 PCI: LM minor irregs, m-dLAD 50%, dLAD 40%, pLCx 80% (s/p PCI/DES 0%), pRCA 40%, dRCA 30%, 1st RPLB 40%   Chronic kidney disease, stage 3a (HCC)    Chronic systolic CHF (congestive heart failure) (HCC)    a. 02/2015 Echo: EF 20-25%, diff HK, mod MR, mildy dil LA, mildly dil RV w/ mod RV syst dysfxn, mildly dil RA, mod TR, PASP ; b. 09/2017 Echo: EF 40-45%, diff HK. Gr1 DD. Mild to mod MR. Mildly to mod dil LA; c. 09/2019 Echo: EF 25-30%, glob HK, mod reduced RV fxn, sev elev PASP. Mod dil LA. Mild-mod MR;  d. 05/2021 Echo: EF 20-25%, glob HK, sev red RV fxn, sev BAE, sev MR/TR/TS, triv AI.   Diabetes mellitus without complication (HCC)    Essential hypertension    Hypertensive heart disease    Left inguinal hernia 05/2020   Mixed Ischemic and Non-ischemic Cardiomyopathy    a. 02/2015 EF 20-25%, diff HK; b. 09/2016 EF 40-45%; c. 09/2019 EF 25-30%; d. 05/2021 Echo: EF 20-25%, glob HK.   Persistent atrial fibrillation (HCC)    a. Dx 05/2021-->CHA2DS2VASc = 5-->eliquis.   Pulmonary hypertension (HCC)    Severe mitral regurgitation    a. 09/2017 Echo: Mild to mod MR; b. 09/2019 Echo: mild-mod MR; c. 05/2021 Echo: Sev MR/TR/TS.   Tubular adenoma of colon     Medications:  Per note from Anti-Coag visit on 04/24/23 patient taking Warfarin 2.5 mg po daily.  Assessment: 78 yo male presenting to ED by EMS due to being found in floor by wife.  PMH includes Afib on Warfarin, CAD, DM, HTN, CHF, and CKD.  Patient treated for sepsis likely  due to pneumonia.  Pharmacy consulted to manage Warfarin.  Goal of Therapy:  INR 2-3 Monitor platelets by anticoagulation protocol: Yes    Date INR Warfarin Dose  3/28 5.6 Hold  3/29 7.2 Hold     Plan:  INR is supratherpaeutic and trending up.  Hold Warfarin and give Vitamin K 10mg  IV x 1. Daily INR while admitted and CBC at least weekly.  Bettey Costa, PharmD 05/04/2023,9:16 AM

## 2023-05-04 NOTE — Progress Notes (Addendum)
 PHARMACY - PHYSICIAN COMMUNICATION CRITICAL VALUE ALERT - BLOOD CULTURE IDENTIFICATION (BCID)  [name]. is an 78 y.o. male who presented to [facility] on 05/03/2023 with a chief complaint of severe sepsis.  Assessment:  Staph aureus in 4 of 4 bottles, no resistance detected.  (include suspected source if known)  Name of physician (or Provider) Contacted:  Lindajo Royal, MD   Current antibiotics: Vanc, metronidazole, cefepime  Changes to prescribed antibiotics recommended:  Recommendations accepted by provider  -  MD okay with stopping current antibiotics and changing to cefazolin 2g IV every 8 hours. Will await full susceptibility report for further de-escalation  Results for orders placed or performed during the hospital encounter of 05/03/23  Blood Culture ID Panel (Reflexed) (Collected: 05/03/2023  5:27 PM)  Result Value Ref Range   Enterococcus faecalis NOT DETECTED NOT DETECTED   Enterococcus Faecium NOT DETECTED NOT DETECTED   Listeria monocytogenes NOT DETECTED NOT DETECTED   Staphylococcus species DETECTED (A) NOT DETECTED   Staphylococcus aureus (BCID) DETECTED (A) NOT DETECTED   Staphylococcus epidermidis NOT DETECTED NOT DETECTED   Staphylococcus lugdunensis NOT DETECTED NOT DETECTED   Streptococcus species NOT DETECTED NOT DETECTED   Streptococcus agalactiae NOT DETECTED NOT DETECTED   Streptococcus pneumoniae NOT DETECTED NOT DETECTED   Streptococcus pyogenes NOT DETECTED NOT DETECTED   A.calcoaceticus-baumannii NOT DETECTED NOT DETECTED   Bacteroides fragilis NOT DETECTED NOT DETECTED   Enterobacterales NOT DETECTED NOT DETECTED   Enterobacter cloacae complex NOT DETECTED NOT DETECTED   Escherichia coli NOT DETECTED NOT DETECTED   Klebsiella aerogenes NOT DETECTED NOT DETECTED   Klebsiella oxytoca NOT DETECTED NOT DETECTED   Klebsiella pneumoniae NOT DETECTED NOT DETECTED   Proteus species NOT DETECTED NOT DETECTED   Salmonella species NOT DETECTED NOT  DETECTED   Serratia marcescens NOT DETECTED NOT DETECTED   Haemophilus influenzae NOT DETECTED NOT DETECTED   Neisseria meningitidis NOT DETECTED NOT DETECTED   Pseudomonas aeruginosa NOT DETECTED NOT DETECTED   Stenotrophomonas maltophilia NOT DETECTED NOT DETECTED   Candida albicans NOT DETECTED NOT DETECTED   Candida auris NOT DETECTED NOT DETECTED   Candida glabrata NOT DETECTED NOT DETECTED   Candida krusei NOT DETECTED NOT DETECTED   Candida parapsilosis NOT DETECTED NOT DETECTED   Candida tropicalis NOT DETECTED NOT DETECTED   Cryptococcus neoformans/gattii NOT DETECTED NOT DETECTED   Meth resistant mecA/C and MREJ NOT DETECTED NOT DETECTED    [name],[name] 05/04/2023  5:58 AM

## 2023-05-04 NOTE — Assessment & Plan Note (Signed)
 Neuropathy related to chemotherapy - No acute disuse

## 2023-05-04 NOTE — Consult Note (Addendum)
 ID stewardship virtual consult note   78 yo male hix cad, nicm (ef 20%), afib on coumadin, ckd3, admitted for confusion and falls  Recent cards outpatient visit -- refused icd; cardiac meds not optimized due to finance issue  Presenting labs: Elevated lft's ast>alt and elevated tbili as well, lipase Elevated cpk 2980s Aki on ckd cr 2.9 Troponinemia Ct abd pelv no acute abnormality; liver u/s negative murphy sign -- present of cholelithiasis Ct cspine concerning for prevertebral fluid -- mri cspine confirms finding including thin epidural fluid collection   3/28 Bcx mssa   Labs: Lab Results  Component Value Date   WBC 11.4 (H) 05/04/2023   HGB 12.5 (L) 05/04/2023   HCT 38.7 (L) 05/04/2023   MCV 102.1 (H) 05/04/2023   PLT 100 (L) 05/04/2023   Last metabolic panel Lab Results  Component Value Date   GLUCOSE 204 (H) 05/04/2023   NA 141 05/04/2023   K 3.8 05/04/2023   CL 109 05/04/2023   CO2 21 (L) 05/04/2023   BUN 72 (H) 05/04/2023   CREATININE 2.39 (H) 05/04/2023   GFRNONAA 27 (L) 05/04/2023   CALCIUM 7.9 (L) 05/04/2023   PROT 4.5 (L) 05/04/2023   ALBUMIN 2.0 (L) 05/04/2023   LABGLOB 3.8 08/16/2017   AGRATIO 0.5 (L) 08/16/2017   BILITOT 2.9 (H) 05/04/2023   ALKPHOS 62 05/04/2023   AST 187 (H) 05/04/2023   ALT 188 (H) 05/04/2023   ANIONGAP 11 05/04/2023       Imaging: 3/28 ct cspine IMPRESSION: Head:   1. No acute intracranial abnormality. Atrophy and chronic microvascular ischemic changes.   Cervical spine:   1. No acute cervical spine fracture or traumatic listhesis. 2. Prevertebral fluid and swelling extending from C2-C6, concerning for ligamentous injury. MRI cervical spine is recommended. 3. Irregularity of the C4-C5 and C6-C7 endplates is likely degenerative, but given prevertebral fluid and leukocytosis, attention on follow-up MRI is recommended to exclude osteomyelitis-discitis.   3/28 mri cspine 1. Large volume prevertebral edema in the  upper cervical spine with edema in the C4-C5 disc, possible thinning versus disruption of the posterior longitudinal ligament at this level, and thin epidural fluid collection extending from this level inferiorly into the visualized upper thoracic spine. Findings could be either traumatic or infectious in etiology. A short interval follow-up MRI could assess for change if clinically warranted. 2. Superimposed severe multilevel degenerative change, detailed above and including severe bilateral foraminal stenosis C4-C5 and C5-C6, moderate canal stenosis at C4-C5 and mild-to-moderate canal stenosis C5-C6.  3/29 liver u/s Cholelithiasis without cholecystitis  Consultants: Gi -- suspect lft due to sepsis (lft improving)   Nsg -- remains in c-collar (trauma vs infection vs both); holding anticoagulation     ----------- A/p Mssa bsi concerning for cspine infection/epidural abscess No cardiac device No prosthetic joints per chart review  No central line placed this admission   See recs below Dr Rivka Safer to formally see monday      [facility] Staphylococcus aureus bacteremia   Staphylococcus aureus bacteremia (SAB) is associated with a high rate of complications and mortality.  Specific aspects of clinical management are critical to optimizing the outcome of patients with SAB.  Therefore, the [facility] Antimicrobial Management Team [facility]) has initiated an intervention aimed at improving the management of SAB at [facility].  To do so, Infectious Diseases physicians are providing an evidence-based consult for the management of all patients with SAB.     Yes No Comments  Perform follow-up  blood cultures (even if the patient is afebrile) to ensure clearance of bacteremia [x]  []    Remove vascular catheter and obtain follow-up blood cultures after the removal of the catheter []   N/A [x]    Perform echocardiography to evaluate for endocarditis  (transthoracic ECHO is 40-50% sensitive, TEE is > 90% sensitive) [x]  []  Please keep in mind, that neither test can definitively EXCLUDE endocarditis, and that should clinical suspicion remain high for endocarditis the patient should then still be treated with an "endocarditis" duration of therapy = 6 weeks  Consult electrophysiologist to evaluate implanted cardiac device (pacemaker, ICD) []  [x]    Ensure source control [x]  []  Have all abscesses been drained effectively? Have deep seeded infections (septic joints or osteomyelitis) had appropriate surgical debridement?  Investigate for "metastatic" sites of infection [x]  []  Does the patient have ANY symptom or physical exam finding that would suggest a deeper infection (back or neck pain that may be suggestive of vertebral osteomyelitis or epidural abscess, muscle pain that could be a symptom of pyomyositis)?  Keep in mind that for deep seeded infections MRI imaging with contrast is preferred rather than other often insensitive tests such as plain x-rays, especially early in a patient's presentation.  Change antibiotic therapy to cefazolin [x]  []  Beta-lactam antibiotics are preferred for MSSA due to higher cure rates.   If on Vancomycin, goal trough should be 15 - 20 mcg/mL  Estimated duration of IV antibiotic therapy:   Pending further w/u, but given cspine likely at least 6 weeks [x]  []  Consult case management for probably prolonged outpatient IV antibiotic therapy   Maintain standard isolation precaution

## 2023-05-04 NOTE — Plan of Care (Signed)
  Problem: Clinical Measurements: Goal: Diagnostic test results will improve Outcome: Progressing   Problem: Respiratory: Goal: Ability to maintain adequate ventilation will improve Outcome: Progressing   Problem: Education: Goal: Knowledge of General Education information will improve Description: Including pain rating scale, medication(s)/side effects and non-pharmacologic comfort measures Outcome: Progressing   Problem: Clinical Measurements: Goal: Diagnostic test results will improve Outcome: Progressing   Problem: Nutrition: Goal: Adequate nutrition will be maintained Outcome: Progressing

## 2023-05-04 NOTE — Consult Note (Signed)
 Cardiology Consultation   Patient ID: [name]. MRN: [id number]; DOB: 09/25/1945  Admit date: 05/03/2023 Date of Consult: 05/04/2023  PCP:  Glori Luis, MD (Inactive)   [facility] Providers Cardiologist:  Lorine Bears, MD   Advanced HF: Dr. Kerby Less here to update MD or APP on Care Team, Refresh:1}     Patient Profile:   [name]. is a 78 y.o. 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 Meriam Sprague.  History of Present Illness:   [name] reports several days of weakness prior to presentation. Went to bed 3/27 around 9 PM, then he woke up on the floor. Wife found him around 10 AM 3/28.  Er workup with negative head CT, significantly elevated CK of 2978, elevated troponin of 342->53, acute kidney injury (Cr 2.89, baseline 1.7), leukocytosis (WBC 14.7, prior 3.4), UA with protein/Hgb but no bacteria seen, lactate 4.4->9->2.2, INR 5.6->7.2, Ast/ALT 301/282, Tbili 4.1, alk phos normal. Respiratory panel negative, blood cultures showing MSSA in 4/4 bottles. MRI with epidural fluid collection in cspine. Coox 54.  He was last seen by Dr. Gala Romney 04/16/23. At that time, felt to be stable. No med changes at that time. Has declined DOAC and entresto previously due to cost. Declines ICD.  Overall he feels weak, says he has pain everywhere. Mild nonproductive cough, breathing stable.    Past Medical History:  Diagnosis Date   Adenocarcinoma of colon (HCC) 06/19/2013   a. moderately differentiated stage IIIc (T4bN2a M0) adenocarcinoma of colon  s/p colectomy followed by chemoRx with oxaliplatin & fluorouacil.   Anemia    CAD (coronary artery disease)    a. 02/2015 Cath: LM nl, LAD 50/40 mid/distal, LCX 80p, RCA 40p, 30d, RPL1 40, CO 3.27, CI 1.65; b. 06/09/15 PCI: LM minor  irregs, m-dLAD 50%, dLAD 40%, pLCx 80% (s/p PCI/DES 0%), pRCA 40%, dRCA 30%, 1st RPLB 40%   Chronic kidney disease, stage 3a (HCC)    Chronic systolic CHF (congestive heart failure) (HCC)    a. 02/2015 Echo: EF 20-25%, diff HK, mod MR, mildy dil LA, mildly dil RV w/ mod RV syst dysfxn, mildly dil RA, mod TR, PASP ; b. 09/2017 Echo: EF 40-45%, diff HK. Gr1 DD. Mild to mod MR. Mildly to mod dil LA; c. 09/2019 Echo: EF 25-30%, glob HK, mod reduced RV fxn, sev elev PASP. Mod dil LA. Mild-mod MR; d. 05/2021 Echo: EF 20-25%, glob HK, sev red RV fxn, sev BAE, sev MR/TR/TS, triv AI.   Diabetes mellitus without complication (HCC)    Essential hypertension    Hypertensive heart disease    Left inguinal hernia 05/2020   Mixed Ischemic and Non-ischemic Cardiomyopathy    a. 02/2015 EF 20-25%, diff HK; b. 09/2016 EF 40-45%; c. 09/2019 EF 25-30%; d. 05/2021 Echo: EF 20-25%, glob HK.   Persistent atrial fibrillation (HCC)    a. Dx 05/2021-->CHA2DS2VASc = 5-->eliquis.   Pulmonary hypertension (HCC)    Severe mitral regurgitation    a. 09/2017 Echo: Mild to mod MR; b. 09/2019 Echo: mild-mod MR; c. 05/2021 Echo: Sev MR/TR/TS.   Tubular adenoma of colon     Past Surgical History:  Procedure Laterality Date   APPENDECTOMY N/A 06/19/2017   Location: [facility]; Surgeon: Claude Manges, MD   CARDIAC CATHETERIZATION Bilateral 02/24/2015   Procedure: Right/Left  Heart Cath and Coronary Angiography;  Surgeon: Iran Ouch, MD;  Location: [facility] INVASIVE CV LAB;  Service: Cardiovascular;  Laterality: Bilateral;   CARDIAC CATHETERIZATION N/A 06/09/2015   Procedure: Coronary Stent Intervention (3.0 x 12 mm Xience Alpine DES to LCx);  Surgeon: Iran Ouch, MD;  Location: [facility] INVASIVE CV LAB;  Service: Cardiovascular;  Laterality: N/A   COLECTOMY  06/19/2017   Descending and proximal sigmoid colectomy, LEFT ureterolysis, partial cecectomy, appendectomy; Location: [facility]; Surgeon: Claude Manges, MD   COLONOSCOPY      COLONOSCOPY WITH PROPOFOL N/A 12/11/2016   Procedure: COLONOSCOPY WITH PROPOFOL;  Surgeon: Christena Deem, MD;  Location: [facility] ENDOSCOPY;  Service: Endoscopy;  Laterality: N/A;   ESOPHAGOGASTRODUODENOSCOPY     HERNIA REPAIR     PORT-A-CATH REMOVAL N/A 07/12/2014   Procedure: REMOVAL PORT-A-CATH;  Surgeon: Duwaine Maxin, MD;  Location: [facility] ORS;  Service: General;  Laterality: N/A;   PORTACATH PLACEMENT Left 2015     Home Medications:  Prior to Admission medications   Medication Sig Start Date End Date Taking? Authorizing Provider  atorvastatin (LIPITOR) 40 MG tablet TAKE 1 TABLET(40 MG) BY MOUTH DAILY 12/21/22  Yes Iran Ouch, MD  carvedilol (COREG) 3.125 MG tablet TAKE 1 TABLET BY MOUTH 2 TIMES DAILY WITH A MEAL 03/25/23  Yes Iran Ouch, MD  cholecalciferol (VITAMIN D3) 25 MCG (1000 UNIT) tablet Take 1,000 Units by mouth daily.   Yes [provider]  FARXIGA 10 MG TABS tablet TAKE 1 TABLET(10 MG) BY MOUTH DAILY BEFORE BREAKFAST 12/26/22  Yes Bensimhon, Bevelyn Buckles, MD  ferrous sulfate 325 (65 FE) MG tablet Take 325 mg by mouth daily with breakfast.   Yes [provider]  gabapentin (NEURONTIN) 100 MG capsule Take 100 mg by mouth 3 (three) times daily. Taking 2 -100 mg pills in am and 1 -100 mg at night 04/12/21 05/04/23 Yes [provider]  loratadine (CLARITIN) 10 MG tablet TAKE 1 TABLET(10 MG) BY MOUTH DAILY 05/25/22  Yes Glori Luis, MD  losartan (COZAAR) 100 MG tablet Take 1 tablet (100 mg total) by mouth daily. 08/21/22  Yes Bensimhon, Bevelyn Buckles, MD  Magnesium Oxide 400 MG CAPS Take 1 capsule (400 mg total) by mouth daily. 12/12/21  Yes Creig Hines, NP  spironolactone (ALDACTONE) 25 MG tablet TAKE 1 TABLET(25 MG) BY MOUTH DAILY 03/13/22  Yes Clarisa Kindred A, FNP  torsemide (DEMADEX) 20 MG tablet Take 1 tablet (20 mg total) by mouth daily. 02/27/23  Yes Bensimhon, Bevelyn Buckles, MD  warfarin (COUMADIN) 5 MG tablet TAKE 1/2 TO 1 TABLET  BY MOUTH EVERY DAY AS DIRECTED BY COUMADIN CLINIC 12/26/22  Yes Iran Ouch, MD  amLODipine (NORVASC) 5 MG tablet Take 5 mg by mouth daily. Patient not taking: Reported on 05/04/2023    [provider]  Blood Glucose Monitoring Suppl DEVI 1 each by Does not apply route daily. May substitute to any manufacturer covered by patient's insurance. 01/23/23   Glori Luis, MD  Glucose Blood (BLOOD GLUCOSE TEST STRIPS) STRP 1 each by In Vitro route daily as needed. May substitute to any manufacturer covered by patient's insurance. 01/23/23   Glori Luis, MD  Lancet Device MISC 1 each by Does not apply route daily as needed. May substitute to any manufacturer covered by patient's insurance. 01/23/23   Glori Luis, MD  Lancets Misc. MISC 1 each by Does not apply route daily as needed. May substitute to any manufacturer covered  by patient's insurance. 01/23/23   Glori Luis, MD  metFORMIN (GLUCOPHAGE) 500 MG tablet Take 500 mg by mouth 2 (two) times daily with a meal. Patient not taking: Reported on 05/04/2023    [provider]  potassium chloride SA (KLOR-CON M) 20 MEQ tablet TAKE 2 TABLETS(40 MEQ) BY MOUTH DAILY Patient not taking: Reported on 05/04/2023 04/23/22   Creig Hines, NP    Inpatient Medications: Scheduled Meds:  Chlorhexidine Gluconate Cloth  6 each Topical Daily   guaiFENesin  600 mg Oral BID   mupirocin ointment   Nasal BID   ondansetron (ZOFRAN) IV  4 mg Intravenous Once   mouth rinse  15 mL Mouth Rinse 4 times per day   Continuous Infusions:   ceFAZolin (ANCEF) IV 2 g (05/04/23 1404)   lactated ringers 150 mL/hr (05/04/23 1200)   PRN Meds: acetaminophen **OR** acetaminophen, albuterol, HYDROcodone-acetaminophen, LORazepam, ondansetron **OR** ondansetron (ZOFRAN) IV, mouth rinse  Allergies:    Allergies  Allergen Reactions   Shellfish Allergy Other (See Comments) and Hives    Pt. instructed by MD to avoid seafood     Social History:   Social History   Socioeconomic History   Marital status: Married    Spouse name: [name]   Number of children: 4   Years of education: Not on file   Highest education level: Not on file  Occupational History   Occupation: retired  Tobacco Use   Smoking status: Never   Smokeless tobacco: Never  Vaping Use   Vaping status: Never Used  Substance and Sexual Activity   Alcohol use: Not Currently    Comment: none in many years   Drug use: No   Sexual activity: Yes  Other Topics Concern   Not on file  Social History Narrative   Not on file   Social Drivers of Health   Financial Resource Strain: Low Risk  (10/17/2022)   Overall Financial Resource Strain (CARDIA)    Difficulty of Paying Living Expenses: Not hard at all  Food Insecurity: No Food Insecurity (05/04/2023)   Hunger Vital Sign    Worried About Running Out of Food in the Last Year: Never true    Ran Out of Food in the Last Year: Never true  Transportation Needs: No Transportation Needs (05/04/2023)   PRAPARE - Administrator, Civil Service (Medical): No    Lack of Transportation (Non-Medical): No  Physical Activity: Sufficiently Active (10/17/2022)   Exercise Vital Sign    Days of Exercise per Week: 4 days    Minutes of Exercise per Session: 60 min  Stress: No Stress Concern Present (10/17/2022)   [employer] of Occupational Health - Occupational Stress Questionnaire    Feeling of Stress : Not at all  Social Connections: Moderately Isolated (05/04/2023)   Social Connection and Isolation Panel [NHANES]    Frequency of Communication with Friends and Family: Never    Frequency of Social Gatherings with Friends and Family: Once a week    Attends Religious Services: More than 4 times per year    Active Member of [employer] or Organizations: No    Attends Banker Meetings: Never    Marital Status: Married  Catering manager Violence: Not At Risk (05/04/2023)   Humiliation,  Afraid, Rape, and Kick questionnaire    Fear of Current or Ex-Partner: No    Emotionally Abused: No    Physically Abused: No    Sexually Abused: No    Family History:  Family History  Problem Relation Age of Onset   Cancer Mother    Other Father        unknown medical history     ROS:  Please see the history of present illness.   All other ROS reviewed and negative.     Physical Exam/Data:   Vitals:   05/04/23 1100 05/04/23 1200 05/04/23 1230 05/04/23 1336  BP: (!) 94/55 (!) 81/56 91/75 97/63   Pulse: (!) 44 90 (!) 35 93  Resp: 15 16 14  (!) 28  Temp:  97.7 F (36.5 C)    TempSrc:  Oral    SpO2: 96% 96% 96% 92%  Weight:      Height:        Intake/Output Summary (Last 24 hours) at 05/04/2023 1412 Last data filed at 05/04/2023 1200 Gross per 24 hour  Intake 3213.59 ml  Output 20 ml  Net 3193.59 ml      05/04/2023    4:06 AM 05/03/2023    4:22 PM 04/16/2023    2:56 PM  Last 3 Weights  Weight (lbs) 161 lb 2.5 oz 166 lb 3.6 oz 166 lb 6.4 oz  Weight (kg) 73.1 kg 75.4 kg 75.479 kg     Body mass index is 23.8 kg/m.  General:  Well nourished, well developed, in no acute distress HEENT: normal Neck: Neck brace in place, cannot visualize JVD Vascular: No carotid bruits; Distal pulses 2+ bilaterally Cardiac: slightly irregular  S1, S2; RRR; no murmur  Lungs:  clear to auscultation bilaterally, no wheezing, rhonchi or rales  Abd: soft, nontender, no hepatomegaly  Ext: no pitting edema; chronic firmness. Warm Musculoskeletal:  No deformities, BUE and BLE strength normal and equal Skin: warm and dry  Neuro:  CNs 2-12 intact, no focal abnormalities noted Psych:  Normal affect   EKG:  The EKG was personally reviewed and demonstrates:  atrial fibrillation with PVCs Telemetry:  Telemetry was personally reviewed and demonstrates:  atrial fib with occasional PVC, one 5 beat episode of NSVT  Relevant CV Studies: Prior studies reviewed  Laboratory Data:  High Sensitivity  Troponin:   Recent Labs  Lab 05/03/23 1727 05/03/23 2112  TROPONINIHS 342* 53*     Chemistry Recent Labs  Lab 05/03/23 1630 05/04/23 0522  NA 143 141  K 3.9 3.8  CL 103 109  CO2 22 21*  GLUCOSE 180* 204*  BUN 72* 72*  CREATININE 2.89* 2.39*  CALCIUM 8.9 7.9*  GFRNONAA 22* 27*  ANIONGAP 18* 11    Recent Labs  Lab 05/03/23 1630 05/04/23 0522  PROT 6.8 4.5*  ALBUMIN 3.0* 2.0*  AST 301* 187*  ALT 282* 188*  ALKPHOS 96 62  BILITOT 4.1* 2.9*   Lipids No results for input(s): "CHOL", "TRIG", "HDL", "LABVLDL", "LDLCALC", "CHOLHDL" in the last 168 hours.  Hematology Recent Labs  Lab 05/03/23 1630 05/04/23 0522  WBC 14.7* 11.4*  RBC 4.31 3.79*  HGB 14.3 12.5*  HCT 45.2 38.7*  MCV 104.9* 102.1*  MCH 33.2 33.0  MCHC 31.6 32.3  RDW 17.0* 17.1*  PLT 124* 100*   Thyroid No results for input(s): "TSH", "FREET4" in the last 168 hours.  BNPNo results for input(s): "BNP", "PROBNP" in the last 168 hours.  DDimer No results for input(s): "DDIMER" in the last 168 hours.   Radiology/Studies:  US Abdomen Limited RUQ (LIVER/GB) Result Date: 05/04/2023 CLINICAL DATA:  Elevated LFTs.  History of colon cancer. EXAM: ULTRASOUND ABDOMEN LIMITED RIGHT UPPER QUADRANT COMPARISON:  CT abdomen pelvis from  yesterday. FINDINGS: Gallbladder: Contracted gallbladder containing multiple shadowing gallstones. No wall thickening visualized. Trace pericholecystic fluid. No sonographic Murphy sign noted by sonographer. Common bile duct: Diameter: 6 mm, normal. Liver: No focal lesion identified. Within normal limits in parenchymal echogenicity. Portal vein is patent on color Doppler imaging with normal direction of blood flow towards the liver. Other: Trace right perinephric fluid. Right renal simple cysts again noted. No follow-up imaging is recommended. IMPRESSION: 1. Cholelithiasis without evidence of acute cholecystitis. Electronically Signed   By: Obie Dredge M.D.   On: 05/04/2023 12:31   MR  Cervical Spine W and Wo Contrast Result Date: 05/03/2023 CLINICAL DATA:  Neck pain, infection suspected, positive xray/CT. EXAM: MRI CERVICAL SPINE WITHOUT AND WITH CONTRAST TECHNIQUE: Multiplanar and multiecho pulse sequences of the cervical spine, to include the craniocervical junction and cervicothoracic junction, were obtained without and with intravenous contrast. CONTRAST:  7.71mL GADAVIST GADOBUTROL 1 MMOL/ML IV SOLN COMPARISON:  None Available. FINDINGS: Alignment: Straightening.  No substantial sagittal subluxation. Vertebrae: Edema within the disc at C4-C5 without substantial adjacent marrow edema. Please see same day CT of the cervical spine for better evaluation of osseous bony detail. Cord: Normal cord signal. Posterior Fossa, vertebral arteries, paraspinal tissues: Large volume prevertebral edema extending from the craniocervical junction to the lower cervical levels. Possible thinning versus destruction of the posterior longitudinal ligament at C4-C5 (series 7, image 9). Otherwise, no evidence of ligamentous injury. Disc levels: Thin epidural fluid collection which extends circumferentially from C4-C5 inferiorly into the visualized upper thoracic canal. C2-C3: Bilateral facet and uncovertebral hypertrophy with moderate left foraminal stenosis. Mild canal stenosis. C3-C4: Posterior disc osteophyte complex with bilateral facet uncovertebral hypertrophy. Resulting moderate to severe bilateral foraminal stenosis and mild canal stenosis. C4-C5: Posterior disc osteophyte complex with bilateral facet uncovertebral hypertrophy. Resulting severe bilateral foraminal stenosis and moderate canal stenosis. C5-C6: Posterior disc osteophyte complex with bilateral facet uncovertebral hypertrophy. Resulting severe bilateral foraminal stenosis and mild to moderate canal stenosis. C6-C7: Posterior disc osteophyte complex with bilateral facet uncovertebral hypertrophy. Resulting moderate to severe bilateral foraminal  stenosis and mild canal stenosis. C7-T1: Bilateral facet uncovertebral hypertrophy without significant stenosis. IMPRESSION: 1. Large volume prevertebral edema in the upper cervical spine with edema in the C4-C5 disc, possible thinning versus disruption of the posterior longitudinal ligament at this level, and thin epidural fluid collection extending from this level inferiorly into the visualized upper thoracic spine. Findings could be either traumatic or infectious in etiology. A short interval follow-up MRI could assess for change if clinically warranted. 2. Superimposed severe multilevel degenerative change, detailed above and including severe bilateral foraminal stenosis C4-C5 and C5-C6, moderate canal stenosis at C4-C5 and mild-to-moderate canal stenosis C5-C6. Findings discussed with Dr. Larinda Buttery via telephone at 9:45 p.m. Electronically Signed   By: Feliberto Harts M.D.   On: 05/03/2023 22:00   CT Head Wo Contrast Result Date: 05/03/2023 CLINICAL DATA:  Larey Seat out of bed.  Found on the floor. EXAM: CT HEAD WITHOUT CONTRAST CT CERVICAL SPINE WITHOUT CONTRAST TECHNIQUE: Multidetector CT imaging of the head and cervical spine was performed following the standard protocol without intravenous contrast. Multiplanar CT image reconstructions of the cervical spine were also generated. RADIATION DOSE REDUCTION: This exam was performed according to the departmental dose-optimization program which includes automated exposure control, adjustment of the mA and/or kV according to patient size and/or use of iterative reconstruction technique. COMPARISON:  None Available. FINDINGS: CT HEAD FINDINGS Brain: No evidence of acute infarction, hemorrhage, hydrocephalus, extra-axial collection or mass  lesion/mass effect. Atrophy and chronic microvascular ischemic changes. Vascular: Calcified atherosclerosis at the skull base. No hyperdense vessel. Skull: Normal. Negative for fracture or focal lesion. Sinuses/Orbits: No acute  finding. Other: None. CT CERVICAL SPINE FINDINGS Alignment: Reversal of the normal cervical lordosis. No traumatic malalignment. Skull base and vertebrae: No acute fracture. No primary bone lesion or focal pathologic process. Mild irregularity of the C4-C5 and C6-C7 endplates. Soft tissues and spinal canal: Prevertebral fluid and swelling extending from C2-C6 (series 4, image 44). No visible canal hematoma. Disc levels: Multilevel degenerative changes, moderate from C4-C5 through C6-C7. Upper chest: Negative. Other: None. IMPRESSION: Head: 1. No acute intracranial abnormality. Atrophy and chronic microvascular ischemic changes. Cervical spine: 1. No acute cervical spine fracture or traumatic listhesis. 2. Prevertebral fluid and swelling extending from C2-C6, concerning for ligamentous injury. MRI cervical spine is recommended. 3. Irregularity of the C4-C5 and C6-C7 endplates is likely degenerative, but given prevertebral fluid and leukocytosis, attention on follow-up MRI is recommended to exclude osteomyelitis-discitis. Electronically Signed   By: Obie Dredge M.D.   On: 05/03/2023 18:09   CT Cervical Spine Wo Contrast Result Date: 05/03/2023 CLINICAL DATA:  Larey Seat out of bed.  Found on the floor. EXAM: CT HEAD WITHOUT CONTRAST CT CERVICAL SPINE WITHOUT CONTRAST TECHNIQUE: Multidetector CT imaging of the head and cervical spine was performed following the standard protocol without intravenous contrast. Multiplanar CT image reconstructions of the cervical spine were also generated. RADIATION DOSE REDUCTION: This exam was performed according to the departmental dose-optimization program which includes automated exposure control, adjustment of the mA and/or kV according to patient size and/or use of iterative reconstruction technique. COMPARISON:  None Available. FINDINGS: CT HEAD FINDINGS Brain: No evidence of acute infarction, hemorrhage, hydrocephalus, extra-axial collection or mass lesion/mass effect. Atrophy  and chronic microvascular ischemic changes. Vascular: Calcified atherosclerosis at the skull base. No hyperdense vessel. Skull: Normal. Negative for fracture or focal lesion. Sinuses/Orbits: No acute finding. Other: None. CT CERVICAL SPINE FINDINGS Alignment: Reversal of the normal cervical lordosis. No traumatic malalignment. Skull base and vertebrae: No acute fracture. No primary bone lesion or focal pathologic process. Mild irregularity of the C4-C5 and C6-C7 endplates. Soft tissues and spinal canal: Prevertebral fluid and swelling extending from C2-C6 (series 4, image 44). No visible canal hematoma. Disc levels: Multilevel degenerative changes, moderate from C4-C5 through C6-C7. Upper chest: Negative. Other: None. IMPRESSION: Head: 1. No acute intracranial abnormality. Atrophy and chronic microvascular ischemic changes. Cervical spine: 1. No acute cervical spine fracture or traumatic listhesis. 2. Prevertebral fluid and swelling extending from C2-C6, concerning for ligamentous injury. MRI cervical spine is recommended. 3. Irregularity of the C4-C5 and C6-C7 endplates is likely degenerative, but given prevertebral fluid and leukocytosis, attention on follow-up MRI is recommended to exclude osteomyelitis-discitis. Electronically Signed   By: Obie Dredge M.D.   On: 05/03/2023 18:09   DG Shoulder Right Result Date: 05/03/2023 CLINICAL DATA:  Fall and right shoulder pain. EXAM: RIGHT SHOULDER - 2+ VIEW COMPARISON:  None available. FINDINGS: There is no acute fracture or dislocation. Degenerative changes of the right AC joint and mild spurring of the bony glenoid. The soft tissues are unremarkable. IMPRESSION: 1. No acute fracture or dislocation. 2. Degenerative changes. Electronically Signed   By: Elgie Collard M.D.   On: 05/03/2023 18:09   CT ABDOMEN PELVIS WO CONTRAST Result Date: 05/03/2023 CLINICAL DATA:  Unwitnessed fall.  History of colon cancer. EXAM: CT ABDOMEN AND PELVIS WITHOUT CONTRAST  TECHNIQUE: Multidetector CT imaging of the abdomen  and pelvis was performed following the standard protocol without IV contrast. RADIATION DOSE REDUCTION: This exam was performed according to the departmental dose-optimization program which includes automated exposure control, adjustment of the mA and/or kV according to patient size and/or use of iterative reconstruction technique. COMPARISON:  May 11, 2021. FINDINGS: Lower chest: Minimal left pleural effusion is noted with adjacent subsegmental atelectasis. Hepatobiliary: No focal liver abnormality is seen. No gallstones, gallbladder wall thickening, or biliary dilatation. Pancreas: Unremarkable. No pancreatic ductal dilatation or surrounding inflammatory changes. Spleen: Normal in size without focal abnormality. Adrenals/Urinary Tract: Adrenal glands appear normal. Stable bilateral renal cysts are noted for which no further follow-up is required. No hydronephrosis or renal obstruction is noted. Urinary bladder is unremarkable. Stomach/Bowel: Stomach is unremarkable. Status post appendectomy. There is no evidence of bowel obstruction or inflammation. Vascular/Lymphatic: Aortic atherosclerosis. No enlarged abdominal or pelvic lymph nodes. Reproductive: Prostate is unremarkable. Other: No definite hernia is noted. Minimal free fluid is noted in the pelvis and pericolic gutters suggesting ascites. Musculoskeletal: No acute or significant osseous findings. IMPRESSION: Minimal left pleural effusion with minimal adjacent subsegmental atelectasis. Minimal ascites is noted. No other acute abnormality seen in the abdomen or pelvis. Aortic Atherosclerosis (ICD10-I70.0). Electronically Signed   By: Lupita Raider M.D.   On: 05/03/2023 17:59   DG Chest 2 View Result Date: 05/03/2023 CLINICAL DATA:  Weakness, sepsis.  Found down. EXAM: CHEST - 2 VIEW COMPARISON:  Abdominopelvic CT same date. Chest radiographs 06/03/2021 and 05/29/2021. Chest CT 09/20/2020. FINDINGS: Stable  cardiomegaly and aortic atherosclerosis. Persistent left pleural effusion with left lower lobe consolidation, similar to prior chest radiographs. The right lung is clear. No evidence of edema or pneumothorax. The bones appear unchanged. IMPRESSION: Persistent left pleural effusion with left lower lobe consolidation, similar to prior chest radiographs and likely reflecting atelectasis or scarring. Left lower lobe pneumonia not excluded. No edema. Electronically Signed   By: Carey Bullocks M.D.   On: 05/03/2023 17:47     Assessment and Plan:   Severe Sepsis MSSA bacteremia C spine fluid collection -leukocytosis, lactic acidosis, elevated LFTs, acute kidney injury -management per primary team, ID, neurosurgery -GI consulted, not felt to be primary GI source -echo is pending. May need TEE once more clinically stable to exclude endocarditis. INR will need to be <4 and ideally closer to 2 for TEE given his chronic thrombocytopenia and risk of bleeding. Will also need neck to be cleared given c spine fluid collection/current neck brace.  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 Was on losartan 100 mg daily, carvedilol 3.125 mg BID, spironolactone 25 mg daily, Farxiga 10 mg daily prior to admission -was on torsemide 20 mg daily, on hold given need for fluid resuscitation. Once stabilized, may require IV diuresis, will monitor. Appears euvolemic currently  CAD -s/p PCI to Lcx in 2017 -not on aspirin as he is on coumadin -atorvastatin on hold given elevated LFTs on  presentation   Risk Assessment/Risk Scores:        New York Heart Association (NYHA) Functional Class NYHA Class III  CHA2DS2-VASc Score = 6   This indicates a 9.7% annual risk of stroke. The patient's score  is based upon: CHF History: 1 HTN History: 1 Diabetes History: 1 Stroke History: 0 Vascular Disease History: 1 Age Score: 2 Gender Score: 0         For questions or updates, please contact [facility] Please consult [email or web address] for contact info under    Signed, Jodelle Red, MD  05/04/2023 2:12 PM

## 2023-05-04 NOTE — Progress Notes (Signed)
 IR received request to evaluate patient for paravertebral fluid collection. Dr. Lowella Dandy reviewed the images and there is no drainable collection. No IR procedure planned and the order will be deleted. Primary team made aware via Epic Chat.   Alwyn Ren, AGACNP-BC 05/04/2023, 1:22 PM

## 2023-05-04 NOTE — Progress Notes (Signed)
 Patient hypotensive. 88/60(70) MD aware. No new orders at this time. RN closely monitoring. Patient Aox4.

## 2023-05-04 NOTE — Progress Notes (Addendum)
 Critical Lab called to RN. INR 6.97. MD Djan made aware by this RN. No new orders at this time.     New order placed for Vit K in dextrose ivpb. Admin at 1212.

## 2023-05-04 NOTE — Plan of Care (Signed)
  Problem: Fluid Volume: Goal: Hemodynamic stability will improve Outcome: Progressing   Problem: Respiratory: Goal: Ability to maintain adequate ventilation will improve Outcome: Progressing   Problem: Education: Goal: Knowledge of General Education information will improve Description: Including pain rating scale, medication(s)/side effects and non-pharmacologic comfort measures Outcome: Progressing   

## 2023-05-04 NOTE — Assessment & Plan Note (Signed)
 At baseline

## 2023-05-05 ENCOUNTER — Inpatient Hospital Stay (HOSPITAL_COMMUNITY)
Admit: 2023-05-05 | Discharge: 2023-05-05 | Disposition: A | Discharge status: Home / Self Care | Attending: Internal Medicine

## 2023-05-05 DIAGNOSIS — A419 Sepsis, unspecified organism: Secondary | ICD-10-CM | POA: Diagnosis not present

## 2023-05-05 DIAGNOSIS — I50813 Acute on chronic right heart failure: Secondary | ICD-10-CM

## 2023-05-05 DIAGNOSIS — R7881 Bacteremia: Secondary | ICD-10-CM | POA: Diagnosis not present

## 2023-05-05 DIAGNOSIS — I4891 Unspecified atrial fibrillation: Secondary | ICD-10-CM | POA: Diagnosis not present

## 2023-05-05 DIAGNOSIS — R6520 Severe sepsis without septic shock: Secondary | ICD-10-CM | POA: Diagnosis not present

## 2023-05-05 LAB — ECHOCARDIOGRAM COMPLETE
AR max vel: 1.74 cm2
AV Peak grad: 7.7 mmHg
Ao pk vel: 1.39 m/s
Area-P 1/2: 4.57 cm2
Calc EF: 22.9 %
Height: 69 in
MV M vel: 4.2 m/s
MV Peak grad: 70.6 mmHg
S' Lateral: 4.9 cm
Single Plane A2C EF: 28.4 %
Single Plane A4C EF: 23 %
Weight: 2578.5 [oz_av]

## 2023-05-05 LAB — CBC WITH DIFFERENTIAL/PLATELET
Abs Immature Granulocytes: 0.08 10*3/uL — ABNORMAL HIGH (ref 0.00–0.07)
Basophils Absolute: 0 10*3/uL (ref 0.0–0.1)
Basophils Relative: 0 %
Eosinophils Absolute: 0 10*3/uL (ref 0.0–0.5)
Eosinophils Relative: 0 %
HCT: 34.9 % — ABNORMAL LOW (ref 39.0–52.0)
Hemoglobin: 11.1 g/dL — ABNORMAL LOW (ref 13.0–17.0)
Immature Granulocytes: 1 %
Lymphocytes Relative: 3 %
Lymphs Abs: 0.3 10*3/uL — ABNORMAL LOW (ref 0.7–4.0)
MCH: 32.6 pg (ref 26.0–34.0)
MCHC: 31.8 g/dL (ref 30.0–36.0)
MCV: 102.6 fL — ABNORMAL HIGH (ref 80.0–100.0)
Monocytes Absolute: 1.1 10*3/uL — ABNORMAL HIGH (ref 0.1–1.0)
Monocytes Relative: 10 %
Neutro Abs: 8.9 10*3/uL — ABNORMAL HIGH (ref 1.7–7.7)
Neutrophils Relative %: 86 %
Platelets: 108 10*3/uL — ABNORMAL LOW (ref 150–400)
RBC: 3.4 MIL/uL — ABNORMAL LOW (ref 4.22–5.81)
RDW: 16.8 % — ABNORMAL HIGH (ref 11.5–15.5)
WBC: 10.4 10*3/uL (ref 4.0–10.5)
nRBC: 0 % (ref 0.0–0.2)

## 2023-05-05 LAB — BASIC METABOLIC PANEL WITH GFR
Anion gap: 11 (ref 5–15)
BUN: 78 mg/dL — ABNORMAL HIGH (ref 8–23)
CO2: 24 mmol/L (ref 22–32)
Calcium: 7.9 mg/dL — ABNORMAL LOW (ref 8.9–10.3)
Chloride: 103 mmol/L (ref 98–111)
Creatinine, Ser: 2.4 mg/dL — ABNORMAL HIGH (ref 0.61–1.24)
GFR, Estimated: 27 mL/min — ABNORMAL LOW (ref >60)
Glucose, Bld: 274 mg/dL — ABNORMAL HIGH (ref 70–99)
Potassium: 4 mmol/L (ref 3.5–5.1)
Sodium: 138 mmol/L (ref 135–145)

## 2023-05-05 LAB — PROTIME-INR
INR: 2.4 — ABNORMAL HIGH (ref 0.8–1.2)
Prothrombin Time: 26.2 s — ABNORMAL HIGH (ref 11.4–15.2)

## 2023-05-05 MED ORDER — ORAL CARE MOUTH RINSE: 15.0000 mL | OROMUCOSAL | Status: DC | PRN

## 2023-05-05 MED ORDER — CEFAZOLIN SODIUM-DEXTROSE 2-4 GM/100ML-% IV SOLN
2.0000 g | Freq: Two times a day (BID) | INTRAVENOUS | Status: DC
Start: 2023-05-05 — End: 2023-06-14
  Administered 2023-05-05 – 2023-05-14 (×18): 2 g via INTRAVENOUS
  Filled 2023-05-05 (×21): qty 100

## 2023-05-05 MED ORDER — WARFARIN - PHARMACIST DOSING INPATIENT
Freq: Every day | Status: DC
  Filled 2023-05-05: qty 1

## 2023-05-05 MED ORDER — ENSURE ENLIVE PO LIQD
237.0000 mL | Freq: Two times a day (BID) | ORAL | Status: DC
  Administered 2023-05-06 – 2023-05-14 (×7): 237 mL via ORAL

## 2023-05-05 MED ORDER — WARFARIN SODIUM 2.5 MG PO TABS
2.5000 mg | ORAL_TABLET | Freq: Once | ORAL | Status: AC
  Administered 2023-05-05: 2.5 mg via ORAL
  Filled 2023-05-05: qty 1

## 2023-05-05 MED ORDER — ALBUMIN HUMAN 25 % IV SOLN
25.0000 g | Freq: Four times a day (QID) | INTRAVENOUS | Status: AC
  Administered 2023-05-05 (×3): 25 g via INTRAVENOUS
  Filled 2023-05-05 (×2): qty 100

## 2023-05-05 NOTE — Progress Notes (Signed)
 PHARMACY NOTE:  ANTIMICROBIAL RENAL DOSAGE ADJUSTMENT  Current antimicrobial regimen includes a mismatch between antimicrobial dosage and estimated renal function.  As per policy approved by the Pharmacy & Therapeutics and Medical Executive Committees, the antimicrobial dosage will be adjusted accordingly.  Current antimicrobial dosage:  Cefazolin 2g IV every 8 hours  Indication: MSSA Bacteremia   Renal Function:  Estimated Creatinine Clearance: 26.2 mL/min (A) (by C-G formula based on SCr of 2.4 mg/dL (H)).  Antimicrobial dosage has been changed to:  Cefazolin 2g IV every 12 hours    Thank you for allowing pharmacy to be a part of this patient's care.  Gardner Candle, PharmD, BCPS Clinical Pharmacist 05/05/2023 12:12 PM

## 2023-05-05 NOTE — Plan of Care (Signed)
  Problem: Respiratory: Goal: Ability to maintain adequate ventilation will improve Outcome: Progressing   Problem: Education: Goal: Knowledge of General Education information will improve Description: Including pain rating scale, medication(s)/side effects and non-pharmacologic comfort measures Outcome: Progressing   Problem: Activity: Goal: Risk for activity intolerance will decrease Outcome: Progressing   Problem: Nutrition: Goal: Adequate nutrition will be maintained Outcome: Progressing   Problem: Coping: Goal: Level of anxiety will decrease Outcome: Progressing   Problem: Elimination: Goal: Will not experience complications related to bowel motility Outcome: Progressing

## 2023-05-05 NOTE — Progress Notes (Signed)
       CROSS COVER NOTE  NAME: [name]. MRN: [id number] DOB : 06-10-1945 ATTENDING PHYSICIAN: Loyce Dys, MD    Date of Service   05/05/2023   HPI/Events of Note   Sorry boss, I miss you. This pt has Hx significant for CAD s/p stenting, chronic HFrEF secondary to NICM (EF 20 to 25% 1/17), persistent A-fib on warfarin due to cost of Eliquis, HTN, HLD, CKD 3B, and colon cancer 2015, Admitted due to fall at home with multiple issues: Rhabdo, Supratherapeutic INR, sepsis due MSSA bacteremia MRI showed paravertebral edema C4-C5. Went to IR today but no drainable fluid on the spine.Treated per septic protocol. Was on IV fluids until 1500 this afternoon. BP around 70's to low 80's with MAP=65. Symptomatic (lightheaded and dizzines).   Interventions   Assessment/Plan:    05/05/2023   12:00 AM 05/04/2023   11:10 PM 05/04/2023   11:00 PM  Vitals with BMI  Systolic 72 70 75  Diastolic 55 55 60  Pulse 88 91 78    Albumin 2.0 25 gm 25% albumin every 6h x 3 doses        Donnie Mesa NP [facility] Cross Cover 7pm-7am - check amion for availability Pager [phone]

## 2023-05-05 NOTE — Progress Notes (Signed)
 Progress Note   [name]: [name]. ZOX:[id number] DOB: Sep 07, 1945 DOA: 05/03/2023     2 DOS: the [name] was seen and examined on 05/05/2023    Brief hospital course: From HPI: [name]. is a 78 y.o. male with medical history significant for CAD s/p stenting, chronic HFrEF secondary to NICM (EF 20 to 25% 1/17), persistent A-fib on warfarin due to cost of Eliquis, HTN, HLD, CKD 3B, and colon cancer 2015, who is being admitted with multiple acute derangements, related to a fall off his bed with no recollection of the circumstances.  Per wife at bedside, she left [name] in bed this morning and when she returned home at 10 AM [name] was lying on the floor on his stomach with his head turned to the side.  She states he was awake and alert.  He was unable to get up.  At baseline he does not walk with a cane or walker and typically he would have been able to get up on his own. ED course and data review: Tachycardic to 121 with soft blood pressure of 105/75. Respiratory viral panel negative for COVID flu and RSV CT abdomen and pelvis with no acute abnormalities Chest x-ray showing left lower lobe consolidation likely atelectasis, pneumonia not ruled out CT C-spine nontraumatic but concerning for prevertebral fluid with recommendations for follow-up MRI to evaluate for osteomyelitis discitis. TRH showing therefore contacted for admission "       Assessment and Plan:  Severe sepsis (HCC) Severe sepsis versus cardiogenic Possible pneumonia Possible discitis MSSA bacteremia MRI showed large volume prevertebral edema in the advanced cervical spine around C4-C5 Severe sepsis criteria include tachycardia, leukocytosis with lactic acidosis, AKI and hyperbilirubinemia Blood culture growing MSSA Antibiotics have been adjusted according to ID pharmacy Follow-up on culture results Infectious disease on board and case discussed Continue current antibiotic therapy   Pneumonia,  possible Continue current antibiotics Wean off oxygen as tolerated     Abnormal MRI, cervical spine Paravertebral C-spine fluid collection/edema --possible osteomyelitis/discitis Chronic baseline neck pain [name] was seen by currently there is no drainable fluid in the cervical area  We will continue to monitor  Neurosurgery on board     Syncope, presumed Echo did not show any findings of vegetation and EF is unchanged from prior echo Continue telemetry monitoring   Abnormal LFTs--possible biliary tract disease Elevated lipase Abnormal LFTs suspect related to hepatic congestion from low output cardiac failure versus severe sepsis [name] has been seen by gastroenterologist with no recommendation Abdominal ultrasound reviewed showing cholelithiasis with no evidence of acute cholecystitis   CAD S/P percutaneous coronary angioplasty Elevated troponin Troponin 340 but suspecting demand ischemia Cardiologist on board Continue statin therapy   Atrial fibrillation with rapid ventricular response (HCC) Heart rate in the 120s to 130s, likely physiologic BP soft, not supporting heart rate control agents Cardiologist on board and case discussed Currently with supratherapeutic INR-pharmacy to manage warfarin   Acute renal failure superimposed on stage 3b chronic kidney disease (HCC) Second to severe sepsis versus low output and low flow state Monitor for improvement with above treatment Renal function slightly worse today   Traumatic rhabdomyolysis (HCC) Accidental fall from bed-presumed syncope [name] does not know the circumstances of fall that she was found facedown-presuming he syncopized     Chronic HFrEF (heart failure with reduced ejection fraction) (HCC) NICM, EF 20 to 25%-declined AICD in the past Edema lower extremities but otherwise appears to be breathing easily Holding blood pressure lowering  GDMT due to soft BPs and in the setting of sepsis   Supratherapeutic  INR [name] received vitamin K     Thrombocytopenia (HCC) Monitor platelet closely   History of colon cancer Neuropathy related to chemotherapy - No acute disuse     DVT prophylaxis: SCD   Consults: Neurosurgery, dimensional radiology, cardiology   Advance Care Planning:   Code Status: Full Code    Family Communication: wife at bedside   Disposition Plan: Back to previous home environment   Subjective:  [name] seen and examined at bedside this morning Denies nausea vomiting abdominal pain chest pain or cough Noted [name] had symptomatic hypotension overnight requiring albumin administration I discussed with cardiologist Dr. Cristal Deer today who foresees the need for possible inotropic support as [name] has right atrial pressure increase giving We will avoid any more IV fluid if at all possible.    Physical Exam:   Constitutional:      General: He is not in acute distress.    Interventions: Cervical collar in place.     Comments: [name] awake and alert, in c-collar  HENT:     Head: Normocephalic and atraumatic.  Cardiovascular:     Rate and Rhythm: Irregular    Heart sounds: No murmur heard.    No systolic murmur is present.     No diastolic murmur is present.  Pulmonary:     Effort: Pulmonary effort is normal.     Breath sounds: Normal breath sounds.  Abdominal:     Palpations: Abdomen is soft.     Tenderness: There is no abdominal tenderness.  Musculoskeletal:     Right lower leg: 2+ Edema present.     Left lower leg: 2+ Edema present.  Neurological:     General: No focal deficit present.     Mental Status: Mental status is at baseline  Data Reviewed:  Vitals:   05/05/23 1200 05/05/23 1300 05/05/23 1400 05/05/23 1600  BP: (!) 96/55 (!) 86/63 (!) 88/63   Pulse: 88     Resp: 19 (!) 0 12   Temp: 97.9 F (36.6 C)   98.3 F (36.8 C)  TempSrc: Oral   Oral  SpO2: 100%  99% 95%  Weight:      Height:          Latest Ref Rng & Units 05/05/2023     2:52 AM 05/04/2023    5:22 AM 05/03/2023    4:30 PM  CBC  WBC 4.0 - 10.5 K/uL 10.4  11.4  14.7   Hemoglobin 13.0 - 17.0 g/dL 40.9  81.1  91.4   Hematocrit 39.0 - 52.0 % 34.9  38.7  45.2   Platelets 150 - 400 K/uL 108  100  124        Latest Ref Rng & Units 05/05/2023    2:52 AM 05/04/2023    5:22 AM 05/03/2023    4:30 PM  BMP  Glucose 70 - 99 mg/dL 782  956  213   BUN 8 - 23 mg/dL 78  72  72   Creatinine 0.61 - 1.24 mg/dL 0.86  5.78  4.69   Sodium 135 - 145 mmol/L 138  141  143   Potassium 3.5 - 5.1 mmol/L 4.0  3.8  3.9   Chloride 98 - 111 mmol/L 103  109  103   CO2 22 - 32 mmol/L 24  21  22    Calcium 8.9 - 10.3 mg/dL 7.9  7.9  8.9  Author: Loyce Dys, MD 05/05/2023 4:40 PM  For on call review [email or web address].

## 2023-05-05 NOTE — Progress Notes (Signed)
 Rounding Note    Patient Name: [name]. Date of Encounter: 05/05/2023  [facility] Cardiologist: Lorine Bears, MD  Advanced HF: Dr. Gala Romney  Subjective   Resting comfortably in bed. Feels his breathing is stable. BP continues to be running low.  Inpatient Medications    Scheduled Meds:  Chlorhexidine Gluconate Cloth  6 each Topical Daily   guaiFENesin  600 mg Oral BID   mupirocin ointment   Nasal BID   ondansetron (ZOFRAN) IV  4 mg Intravenous Once   mouth rinse  15 mL Mouth Rinse 4 times per day   warfarin  2.5 mg Oral ONCE-1600   Warfarin - Pharmacist Dosing Inpatient   Does not apply q1600   Continuous Infusions:  albumin human Stopped (05/05/23 0830)    ceFAZolin (ANCEF) IV     PRN Meds: acetaminophen **OR** acetaminophen, albuterol, alum & mag hydroxide-simeth, HYDROcodone-acetaminophen, LORazepam, ondansetron **OR** ondansetron (ZOFRAN) IV, mouth rinse   Vital Signs    Vitals:   05/05/23 0900 05/05/23 1000 05/05/23 1100 05/05/23 1200  BP: (!) 89/75 (!) 87/64 (!) 93/55   Pulse: (!) 41 (!) 40 88   Resp: (!) 24 13 20    Temp:    97.9 F (36.6 C)  TempSrc:    Oral  SpO2: 94% 100% 100%   Weight:      Height:        Intake/Output Summary (Last 24 hours) at 05/05/2023 1424 Last data filed at 05/05/2023 1108 Gross per 24 hour  Intake 1630.91 ml  Output 900 ml  Net 730.91 ml      05/04/2023    4:06 AM 05/03/2023    4:22 PM 04/16/2023    2:56 PM  Last 3 Weights  Weight (lbs) 161 lb 2.5 oz 166 lb 3.6 oz 166 lb 6.4 oz  Weight (kg) 73.1 kg 75.4 kg 75.479 kg      Telemetry    Atrial fibrillation with PVCs - Personally Reviewed  Physical Exam   GEN: resting comfortably NECK: neck collar in place CARDIAC: irregular rhythm, normal S1 and S2, no rubs or gallops. 2/6 systolic murmur VASCULAR: Radial pulses 2+ bilaterally.  RESPIRATORY:  diminished slightly at bases laterally, cannot sit up/turn with c collar in place ABDOMEN: Soft,  non-tender, non-distended MUSCULOSKELETAL:  Moves all 4 limbs independently SKIN: firm but no pitting edema, warm NEUROLOGIC:  No focal neuro deficits noted. PSYCHIATRIC:  Normal affect    New pertinent results (labs, ECG, imaging, cardiac studies)    Echo read personally, no significant change, no obvious vegetation  Patient Profile     78 y.o. 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 Meriam Sprague.   Assessment & Plan    Severe Sepsis MSSA bacteremia C spine fluid collection -leukocytosis, lactic acidosis, elevated LFTs, acute kidney injury, hypotension -management per primary team, ID, neurosurgery -GI consulted, not felt to be primary GI source -echo with severely reduced LV and RV function, elevate RAP, no clear endocarditis.  -May need TEE once more clinically stable to exclude endocarditis. INR will need to be <4 and ideally closer to 2 for TEE given his chronic thrombocytopenia and risk of bleeding. Will also need neck to be cleared given c spine fluid collection/current neck brace.  I communicated with primary team. His BP is tenuous, and with his RAP high  and his known severe cardiomyopathy, I would avoid giving any more fluid. If BP drops further, would place central line or PICC, check coox. Would likely need levophed if milrinone started. Still warm on exam, but low threshold for inotropes/pressors if his BP drops, distal perfusion worsens, or labs worsen.   Found down/fall -CK elevated on presentation, now downtrending -CT head without bleed   Atrial fibrillation, initially with RVR -in the setting of sepsis -supratherapeutic INR, with peak 7.2 coumadin on hold, INR today 2.4 -with acute illness, aim for rate control   Chronic systolic and diastolic heart failure Right heart  failure Nonischemic cardiomyopathy -EF 20-25% most recently -see comments above. Felt to be septic shock, but low threshold for picc/central line for assessment/inotropes/pressors if he worsens clinically -has received volume resuscitation given likely rhabdomyolysis, lactic acidosis. Would avoid additional fluids -home medications on hold given hypotension. Was on losartan 100 mg daily, carvedilol 3.125 mg BID, spironolactone 25 mg daily, Farxiga 10 mg daily prior to admission -was on torsemide 20 mg daily, on hold given for fluid resuscitation. Once stabilized, will require IV diuresis, may require inotropes for this   CAD -s/p PCI to Lcx in 2017 -not on aspirin as he is on coumadin -atorvastatin on hold given elevated LFTs on presentation    Signed, Jodelle Red, MD  05/05/2023, 2:24 PM

## 2023-05-05 NOTE — Consult Note (Signed)
 PHARMACY - ANTICOAGULATION CONSULT NOTE  Pharmacy Consult for Warfarin Indication: atrial fibrillation  Allergies  Allergen Reactions   Shellfish Allergy Other (See Comments) and Hives    Pt. instructed by MD to avoid seafood    Patient Measurements: Height: 5\' 9"  (175.3 cm) Weight: 73.1 kg (161 lb 2.5 oz) IBW/kg (Calculated) : 70.7 HEPARIN DW (KG): 73.1  Vital Signs: Temp: 98.4 F (36.9 C) (03/30 0800) Temp Source: Oral (03/30 0800) BP: 93/63 (03/30 0800) Pulse Rate: 85 (03/30 0800)  Labs: Recent Labs    05/03/23 1630 05/03/23 1727 05/03/23 2007 05/03/23 2112 05/04/23 0522 05/05/23 0252  HGB 14.3  --   --   --  12.5* 11.1*  HCT 45.2  --   --   --  38.7* 34.9*  PLT 124*  --   --   --  100* 108*  APTT  --   --  41*  --   --   --   LABPROT  --  51.0*  --   --  62.0* 26.2*  INR  --  5.6*  --   --  7.2* 2.4*  CREATININE 2.89*  --   --   --  2.39* 2.40*  CKTOTAL  --  2,978*  --   --  1,619*  --   TROPONINIHS  --  342*  --  53*  --   --     Estimated Creatinine Clearance: 26.2 mL/min (A) (by C-G formula based on SCr of 2.4 mg/dL (H)).   Medical History: Past Medical History:  Diagnosis Date   Adenocarcinoma of colon (HCC) 06/19/2013   a. moderately differentiated stage IIIc (T4bN2a M0) adenocarcinoma of colon  s/p colectomy followed by chemoRx with oxaliplatin & fluorouacil.   Anemia    CAD (coronary artery disease)    a. 02/2015 Cath: LM nl, LAD 50/40 mid/distal, LCX 80p, RCA 40p, 30d, RPL1 40, CO 3.27, CI 1.65; b. 06/09/15 PCI: LM minor irregs, m-dLAD 50%, dLAD 40%, pLCx 80% (s/p PCI/DES 0%), pRCA 40%, dRCA 30%, 1st RPLB 40%   Chronic kidney disease, stage 3a (HCC)    Chronic systolic CHF (congestive heart failure) (HCC)    a. 02/2015 Echo: EF 20-25%, diff HK, mod MR, mildy dil LA, mildly dil RV w/ mod RV syst dysfxn, mildly dil RA, mod TR, PASP ; b. 09/2017 Echo: EF 40-45%, diff HK. Gr1 DD. Mild to mod MR. Mildly to mod dil LA; c. 09/2019 Echo: EF 25-30%, glob  HK, mod reduced RV fxn, sev elev PASP. Mod dil LA. Mild-mod MR; d. 05/2021 Echo: EF 20-25%, glob HK, sev red RV fxn, sev BAE, sev MR/TR/TS, triv AI.   Diabetes mellitus without complication (HCC)    Essential hypertension    Hypertensive heart disease    Left inguinal hernia 05/2020   Mixed Ischemic and Non-ischemic Cardiomyopathy    a. 02/2015 EF 20-25%, diff HK; b. 09/2016 EF 40-45%; c. 09/2019 EF 25-30%; d. 05/2021 Echo: EF 20-25%, glob HK.   Persistent atrial fibrillation (HCC)    a. Dx 05/2021-->CHA2DS2VASc = 5-->eliquis.   Pulmonary hypertension (HCC)    Severe mitral regurgitation    a. 09/2017 Echo: Mild to mod MR; b. 09/2019 Echo: mild-mod MR; c. 05/2021 Echo: Sev MR/TR/TS.   Tubular adenoma of colon     Medications:  Per note from Anti-Coag visit on 04/24/23 patient taking Warfarin 2.5 mg po daily.  Assessment: 78 yo male presenting to ED by EMS due to being found in floor by wife.  PMH includes Afib on Warfarin, CAD, DM, HTN, CHF, and CKD.  Patient treated for sepsis likely due to pneumonia.  Pharmacy consulted to manage Warfarin.  Goal of Therapy:  INR 2-3 Monitor platelets by anticoagulation protocol: Yes    Date INR Warfarin Dose  3/28 5.6 Hold  3/29 7.2 Hold + vitamin IV 10 mg x 1  3/30 2.4 2.5 mg     Plan:  INR is therapeutic. S/p vitamin IV. Predict INR will continue to trend down. CHADSVASc 6, may need bridge, may need bridge. Will order warfarin 2.5 mg x 1 (home dose). Daily INR. CBC at least every 3 days.   Ronnald Ramp, PharmD 05/05/2023,9:52 AM

## 2023-05-05 NOTE — Progress Notes (Signed)
  Echocardiogram 2D Echocardiogram has been performed.  [name] 05/05/2023, 8:29 AM

## 2023-05-05 NOTE — Plan of Care (Signed)
  Problem: Fluid Volume: Goal: Hemodynamic stability will improve 05/05/2023 1624 by Malachi Bonds, RN Outcome: Progressing 05/05/2023 1624 by Malachi Bonds, RN Outcome: Progressing   Problem: Clinical Measurements: Goal: Diagnostic test results will improve Outcome: Progressing   Problem: Respiratory: Goal: Ability to maintain adequate ventilation will improve Outcome: Not Progressing   Problem: Education: Goal: Knowledge of General Education information will improve Description: Including pain rating scale, medication(s)/side effects and non-pharmacologic comfort measures Outcome: Not Progressing   Problem: Clinical Measurements: Goal: Diagnostic test results will improve Outcome: Not Progressing

## 2023-05-06 ENCOUNTER — Other Ambulatory Visit: Payer: Self-pay

## 2023-05-06 ENCOUNTER — Other Ambulatory Visit (HOSPITAL_COMMUNITY): Payer: Self-pay

## 2023-05-06 ENCOUNTER — Telehealth (HOSPITAL_COMMUNITY): Payer: Self-pay | Admitting: Pharmacy Technician

## 2023-05-06 ENCOUNTER — Inpatient Hospital Stay

## 2023-05-06 DIAGNOSIS — N189 Chronic kidney disease, unspecified: Secondary | ICD-10-CM

## 2023-05-06 DIAGNOSIS — A419 Sepsis, unspecified organism: Secondary | ICD-10-CM | POA: Diagnosis not present

## 2023-05-06 DIAGNOSIS — M5090 Cervical disc disorder, unspecified, unspecified cervical region: Secondary | ICD-10-CM

## 2023-05-06 DIAGNOSIS — B9561 Methicillin susceptible Staphylococcus aureus infection as the cause of diseases classified elsewhere: Secondary | ICD-10-CM | POA: Diagnosis not present

## 2023-05-06 DIAGNOSIS — R7881 Bacteremia: Secondary | ICD-10-CM | POA: Diagnosis not present

## 2023-05-06 DIAGNOSIS — M6282 Rhabdomyolysis: Secondary | ICD-10-CM

## 2023-05-06 DIAGNOSIS — N179 Acute kidney failure, unspecified: Secondary | ICD-10-CM

## 2023-05-06 DIAGNOSIS — R937 Abnormal findings on diagnostic imaging of other parts of musculoskeletal system: Secondary | ICD-10-CM | POA: Diagnosis not present

## 2023-05-06 DIAGNOSIS — R7989 Other specified abnormal findings of blood chemistry: Secondary | ICD-10-CM

## 2023-05-06 DIAGNOSIS — R6520 Severe sepsis without septic shock: Secondary | ICD-10-CM | POA: Diagnosis not present

## 2023-05-06 DIAGNOSIS — I5023 Acute on chronic systolic (congestive) heart failure: Secondary | ICD-10-CM | POA: Diagnosis not present

## 2023-05-06 LAB — CBC WITH DIFFERENTIAL/PLATELET
Abs Immature Granulocytes: 0.07 10*3/uL (ref 0.00–0.07)
Basophils Absolute: 0 10*3/uL (ref 0.0–0.1)
Basophils Relative: 0 %
Eosinophils Absolute: 0.1 10*3/uL (ref 0.0–0.5)
Eosinophils Relative: 1 %
HCT: 32.9 % — ABNORMAL LOW (ref 39.0–52.0)
Hemoglobin: 10.9 g/dL — ABNORMAL LOW (ref 13.0–17.0)
Immature Granulocytes: 1 %
Lymphocytes Relative: 6 %
Lymphs Abs: 0.5 10*3/uL — ABNORMAL LOW (ref 0.7–4.0)
MCH: 33.1 pg (ref 26.0–34.0)
MCHC: 33.1 g/dL (ref 30.0–36.0)
MCV: 100 fL (ref 80.0–100.0)
Monocytes Absolute: 1 10*3/uL (ref 0.1–1.0)
Monocytes Relative: 13 %
Neutro Abs: 6.5 10*3/uL (ref 1.7–7.7)
Neutrophils Relative %: 79 %
Platelets: 96 10*3/uL — ABNORMAL LOW (ref 150–400)
RBC: 3.29 MIL/uL — ABNORMAL LOW (ref 4.22–5.81)
RDW: 16.7 % — ABNORMAL HIGH (ref 11.5–15.5)
WBC: 8.1 10*3/uL (ref 4.0–10.5)
nRBC: 0 % (ref 0.0–0.2)

## 2023-05-06 LAB — COOXEMETRY PANEL
Carboxyhemoglobin: 1.1 % (ref 0.5–1.5)
Methemoglobin: 0.9 % (ref 0.0–1.5)
O2 Saturation: 63.7 %
Total hemoglobin: 11.8 g/dL — ABNORMAL LOW (ref 12.0–16.0)
Total oxygen content: 62.4 %

## 2023-05-06 LAB — CULTURE, BLOOD (ROUTINE X 2)

## 2023-05-06 LAB — LACTIC ACID, PLASMA: Lactic Acid, Venous: 1.2 mmol/L (ref 0.5–1.9)

## 2023-05-06 LAB — PROTIME-INR
INR: 2.6 — ABNORMAL HIGH (ref 0.8–1.2)
Prothrombin Time: 28.2 s — ABNORMAL HIGH (ref 11.4–15.2)

## 2023-05-06 LAB — GLUCOSE, CAPILLARY
Glucose-Capillary: 272 mg/dL — ABNORMAL HIGH (ref 70–99)
Glucose-Capillary: 240 mg/dL — ABNORMAL HIGH (ref 70–99)
Glucose-Capillary: 207 mg/dL — ABNORMAL HIGH (ref 70–99)
Glucose-Capillary: 173 mg/dL — ABNORMAL HIGH (ref 70–99)

## 2023-05-06 LAB — COMPREHENSIVE METABOLIC PANEL WITH GFR
ALT: 35 U/L (ref 0–44)
AST: 131 U/L — ABNORMAL HIGH (ref 15–41)
Albumin: 2.5 g/dL — ABNORMAL LOW (ref 3.5–5.0)
Alkaline Phosphatase: 77 U/L (ref 38–126)
Anion gap: 11 (ref 5–15)
BUN: 85 mg/dL — ABNORMAL HIGH (ref 8–23)
CO2: 24 mmol/L (ref 22–32)
Calcium: 7.9 mg/dL — ABNORMAL LOW (ref 8.9–10.3)
Chloride: 101 mmol/L (ref 98–111)
Creatinine, Ser: 2.57 mg/dL — ABNORMAL HIGH (ref 0.61–1.24)
GFR, Estimated: 25 mL/min — ABNORMAL LOW (ref >60)
Glucose, Bld: 209 mg/dL — ABNORMAL HIGH (ref 70–99)
Potassium: 3.9 mmol/L (ref 3.5–5.1)
Sodium: 136 mmol/L (ref 135–145)
Total Bilirubin: 1.8 mg/dL — ABNORMAL HIGH (ref 0.0–1.2)
Total Protein: 5.2 g/dL — ABNORMAL LOW (ref 6.5–8.1)

## 2023-05-06 LAB — HEMOGLOBIN A1C
Hgb A1c MFr Bld: 6.9 % — ABNORMAL HIGH (ref 4.8–5.6)
Mean Plasma Glucose: 151.33 mg/dL

## 2023-05-06 MED ORDER — INSULIN ASPART 100 UNIT/ML IJ SOLN
0.0000 [IU] | Freq: Three times a day (TID) | INTRAMUSCULAR | Status: DC
  Administered 2023-05-06: 3 [IU] via SUBCUTANEOUS
  Administered 2023-05-07 (×2): 2 [IU] via SUBCUTANEOUS
  Administered 2023-05-08: 3 [IU] via SUBCUTANEOUS
  Administered 2023-05-08: 2 [IU] via SUBCUTANEOUS
  Administered 2023-05-08 – 2023-05-09 (×2): 3 [IU] via SUBCUTANEOUS
  Administered 2023-05-09 (×2): 2 [IU] via SUBCUTANEOUS
  Administered 2023-05-10 (×2): 1 [IU] via SUBCUTANEOUS
  Administered 2023-05-10 – 2023-05-11 (×2): 2 [IU] via SUBCUTANEOUS
  Administered 2023-05-12: 5 [IU] via SUBCUTANEOUS
  Administered 2023-05-12: 1 [IU] via SUBCUTANEOUS
  Administered 2023-05-12 – 2023-05-13 (×2): 3 [IU] via SUBCUTANEOUS
  Administered 2023-05-13: 1 [IU] via SUBCUTANEOUS
  Administered 2023-05-14: 3 [IU] via SUBCUTANEOUS
  Administered 2023-05-14: 1 [IU] via SUBCUTANEOUS
  Filled 2023-05-06 (×20): qty 1

## 2023-05-06 MED ORDER — INSULIN ASPART 100 UNIT/ML IJ SOLN
0.0000 [IU] | Freq: Every day | INTRAMUSCULAR | Status: DC
  Administered 2023-05-06 – 2023-05-12 (×2): 2 [IU] via SUBCUTANEOUS
  Filled 2023-05-06 (×2): qty 1

## 2023-05-06 MED ORDER — WARFARIN SODIUM 2.5 MG PO TABS
2.5000 mg | ORAL_TABLET | Freq: Once | ORAL | Status: AC
  Administered 2023-05-06: 2.5 mg via ORAL
  Filled 2023-05-06: qty 1

## 2023-05-06 MED ORDER — SODIUM CHLORIDE 0.9 % IV SOLN: INTRAVENOUS | Status: DC

## 2023-05-06 MED ORDER — SODIUM CHLORIDE 0.9% FLUSH: 10.0000 mL | INTRAVENOUS | Status: DC | PRN

## 2023-05-06 MED ORDER — SODIUM CHLORIDE 0.9% FLUSH
10.0000 mL | Freq: Two times a day (BID) | INTRAVENOUS | Status: DC
  Administered 2023-05-06: 10 mL
  Administered 2023-05-07: 20 mL
  Administered 2023-05-08: 10 mL
  Administered 2023-05-08: 20 mL
  Administered 2023-05-09: 30 mL
  Administered 2023-05-10 – 2023-05-11 (×3): 10 mL
  Administered 2023-05-11: 20 mL
  Administered 2023-05-12 (×2): 10 mL
  Administered 2023-05-13: 20 mL
  Administered 2023-05-13 – 2023-05-14 (×2): 10 mL

## 2023-05-06 NOTE — Consult Note (Signed)
 PHARMACY - ANTICOAGULATION CONSULT NOTE  Pharmacy Consult for Warfarin Indication: atrial fibrillation  Allergies  Allergen Reactions   Shellfish Allergy Other (See Comments) and Hives    Pt. instructed by MD to avoid seafood    Patient Measurements: Height: 5\' 9"  (175.3 cm) Weight: 73.1 kg (161 lb 2.5 oz) IBW/kg (Calculated) : 70.7 HEPARIN DW (KG): 73.1  Vital Signs: Temp: 97.3 F (36.3 C) (03/31 0354) Temp Source: Oral (03/31 0354) BP: 109/77 (03/31 0643) Pulse Rate: 79 (03/31 0630)  Labs: Recent Labs    05/03/23 1727 05/03/23 2007 05/03/23 2112 05/04/23 0522 05/05/23 0252 05/06/23 0424  HGB  --   --   --  12.5* 11.1* 10.9*  HCT  --   --   --  38.7* 34.9* 32.9*  PLT  --   --   --  100* 108* 96*  APTT  --  41*  --   --   --   --   LABPROT 51.0*  --   --  62.0* 26.2* 28.2*  INR 5.6*  --   --  7.2* 2.4* 2.6*  CREATININE  --   --   --  2.39* 2.40* 2.57*  CKTOTAL 2,978*  --   --  1,619*  --   --   TROPONINIHS 342*  --  53*  --   --   --     Estimated Creatinine Clearance: 24.5 mL/min (A) (by C-G formula based on SCr of 2.57 mg/dL (H)).   Medical History: Past Medical History:  Diagnosis Date   Adenocarcinoma of colon (HCC) 06/19/2013   a. moderately differentiated stage IIIc (T4bN2a M0) adenocarcinoma of colon  s/p colectomy followed by chemoRx with oxaliplatin & fluorouacil.   Anemia    CAD (coronary artery disease)    a. 02/2015 Cath: LM nl, LAD 50/40 mid/distal, LCX 80p, RCA 40p, 30d, RPL1 40, CO 3.27, CI 1.65; b. 06/09/15 PCI: LM minor irregs, m-dLAD 50%, dLAD 40%, pLCx 80% (s/p PCI/DES 0%), pRCA 40%, dRCA 30%, 1st RPLB 40%   Chronic kidney disease, stage 3a (HCC)    Chronic systolic CHF (congestive heart failure) (HCC)    a. 02/2015 Echo: EF 20-25%, diff HK, mod MR, mildy dil LA, mildly dil RV w/ mod RV syst dysfxn, mildly dil RA, mod TR, PASP ; b. 09/2017 Echo: EF 40-45%, diff HK. Gr1 DD. Mild to mod MR. Mildly to mod dil LA; c. 09/2019 Echo: EF 25-30%,  glob HK, mod reduced RV fxn, sev elev PASP. Mod dil LA. Mild-mod MR; d. 05/2021 Echo: EF 20-25%, glob HK, sev red RV fxn, sev BAE, sev MR/TR/TS, triv AI.   Diabetes mellitus without complication (HCC)    Essential hypertension    Hypertensive heart disease    Left inguinal hernia 05/2020   Mixed Ischemic and Non-ischemic Cardiomyopathy    a. 02/2015 EF 20-25%, diff HK; b. 09/2016 EF 40-45%; c. 09/2019 EF 25-30%; d. 05/2021 Echo: EF 20-25%, glob HK.   Persistent atrial fibrillation (HCC)    a. Dx 05/2021-->CHA2DS2VASc = 5-->eliquis.   Pulmonary hypertension (HCC)    Severe mitral regurgitation    a. 09/2017 Echo: Mild to mod MR; b. 09/2019 Echo: mild-mod MR; c. 05/2021 Echo: Sev MR/TR/TS.   Tubular adenoma of colon     Medications:  Per note from Anti-Coag visit on 04/24/23 patient taking Warfarin 2.5 mg po daily.  Assessment: 78 yo male presenting to ED by EMS due to being found in floor by wife.  PMH includes Afib on  Warfarin, CAD, DM, HTN, CHF, and CKD.  Patient treated for sepsis likely due to pneumonia.  Pharmacy consulted to manage Warfarin.  Goal of Therapy:  INR 2-3 Monitor platelets by anticoagulation protocol: Yes    Date INR Warfarin Dose  3/28 5.6 Hold  3/29 7.2 Hold + vitamin IV 10 mg x 1  3/30 2.4 2.5 mg  3/31 2.6 2.5 mg     Plan:  ---INR is therapeutic.  ---Will order warfarin 2.5 mg x 1 (home dose).  ---Daily INR. CBC at least every 3 days.   [name], PharmD 05/06/2023,7:07 AM

## 2023-05-06 NOTE — Plan of Care (Signed)
  Problem: Fluid Volume: Goal: Hemodynamic stability will improve Outcome: Progressing   Problem: Respiratory: Goal: Ability to maintain adequate ventilation will improve Outcome: Progressing   Problem: Clinical Measurements: Goal: Respiratory complications will improve Outcome: Progressing   Problem: Nutrition: Goal: Adequate nutrition will be maintained Outcome: Progressing   Problem: Coping: Goal: Level of anxiety will decrease Outcome: Progressing   Problem: Elimination: Goal: Will not experience complications related to urinary retention Outcome: Progressing   Problem: Pain Managment: Goal: General experience of comfort will improve and/or be controlled Outcome: Progressing   Problem: Skin Integrity: Goal: Risk for impaired skin integrity will decrease Outcome: Progressing   Problem: Coping: Goal: Ability to adjust to condition or change in health will improve Outcome: Progressing   Problem: Tissue Perfusion: Goal: Adequacy of tissue perfusion will improve Outcome: Progressing

## 2023-05-06 NOTE — Plan of Care (Signed)
  Problem: Fluid Volume: Goal: Hemodynamic stability will improve Outcome: Progressing   Problem: Clinical Measurements: Goal: Diagnostic test results will improve Outcome: Progressing   Problem: Clinical Measurements: Goal: Diagnostic test results will improve Outcome: Progressing Goal: Respiratory complications will improve Outcome: Progressing Goal: Cardiovascular complication will be avoided Outcome: Progressing   Problem: Pain Managment: Goal: General experience of comfort will improve and/or be controlled Outcome: Progressing   Problem: Skin Integrity: Goal: Risk for impaired skin integrity will decrease Outcome: Progressing

## 2023-05-06 NOTE — Progress Notes (Addendum)
  Neurosurgery ok for TEE with C-Spine to remain in place.   Orders placed for TEE 05/07/23 by Dr Shirlee Latch.   Jahne Krukowski NP-C  10:16 AM

## 2023-05-06 NOTE — Progress Notes (Signed)
 Per IV team, PICC line ready for use.

## 2023-05-06 NOTE — Progress Notes (Addendum)
 Progress Note   Patient: [name]. HYQ:[id number] DOB: 10/26/1945 DOA: 05/03/2023     3 DOS: the patient was seen and examined on 05/06/2023      Brief hospital course: From HPI: [name]. is a 78 y.o. male with medical history significant for CAD s/p stenting, chronic HFrEF secondary to NICM (EF 20 to 25% 1/17), persistent A-fib on warfarin due to cost of Eliquis, HTN, HLD, CKD 3B, and colon cancer 2015, who is being admitted with multiple acute derangements, related to a fall off his bed with no recollection of the circumstances.  Per wife at bedside, she left patient in bed this morning and when she returned home at 10 AM patient was lying on the floor on his stomach with his head turned to the side.  She states he was awake and alert.  He was unable to get up.  At baseline he does not walk with a cane or walker and typically he would have been able to get up on his own. ED course and data review: Tachycardic to 121 with soft blood pressure of 105/75. Respiratory viral panel negative for COVID flu and RSV CT abdomen and pelvis with no acute abnormalities Chest x-ray showing left lower lobe consolidation likely atelectasis, pneumonia not ruled out CT C-spine nontraumatic but concerning for prevertebral fluid with recommendations for follow-up MRI to evaluate for osteomyelitis discitis. TRH showing therefore contacted for admission "       Assessment and Plan:  Severe sepsis (HCC) Severe sepsis versus cardiogenic Possible pneumonia Possible discitis MSSA bacteremia MRI showed large volume prevertebral edema in the advanced cervical spine around C4-C5 Severe sepsis criteria include tachycardia, leukocytosis with lactic acidosis, AKI and hyperbilirubinemia Blood culture growing MSSA Antibiotics have been adjusted according to ID pharmacy Follow-up on culture results Infectious disease on board and case discussed Continue current antibiotic therapy Cardiology planning on  TEE today   Pneumonia, possible Continue current antibiotics Wean off oxygen as tolerated     Abnormal MRI, cervical spine Paravertebral C-spine fluid collection/edema --possible osteomyelitis/discitis Chronic baseline neck pain Patient was seen by currently there is no drainable fluid in the cervical area  We will continue to monitor  Plan of care discussed with neurosurgeon Neurosurgery recommends to keep cervical collar in place     Syncope, presumed Echo did not show any findings of vegetation and EF is unchanged from prior echo Continue telemetry monitoring   Abnormal LFTs--possible biliary tract disease Elevated lipase Abnormal LFTs suspect related to hepatic congestion from low output cardiac failure versus severe sepsis Patient has been seen by gastroenterologist with no recommendation Abdominal ultrasound showing cholelithiasis with no evidence of acute cholecystitis   CAD S/P percutaneous coronary angioplasty Elevated troponin Troponin 340 but suspecting demand ischemia Cardiologist on board Continue statin therapy   Atrial fibrillation with rapid ventricular response (HCC) Cardiologist on board and case discussed Pharmacy on board with anticoagulant management in the setting of supratherapeutic INR on presentation   Acute renal failure superimposed on stage 3b chronic kidney disease (HCC) Second to severe sepsis versus low output and low flow state Monitor for improvement with above treatment Putting output monitoring   Traumatic rhabdomyolysis (HCC) Accidental fall from bed-presumed syncope Patient does not know the circumstances of fall that she was found facedown-presuming he syncopized     Chronic HFrEF (heart failure with reduced ejection fraction) (HCC) NICM, EF 20 to 25%-declined AICD in the past Edema lower extremities but otherwise appears to be breathing  easily Holding blood pressure lowering GDMT due to soft BPs and in the setting of sepsis    Supratherapeutic INR Patient received vitamin K     Thrombocytopenia (HCC) Monitor platelet closely   History of colon cancer Neuropathy related to chemotherapy - No acute disuse     DVT prophylaxis: SCD   Consults: Neurosurgery, dimensional radiology, cardiology   Advance Care Planning:   Code Status: Full Code    Family Communication: wife at bedside   Disposition Plan: Back to previous home environment   Subjective:  Patient seen and examined at bedside this morning Cardiologist planning on TEE     Physical Exam:   Constitutional:      General: He is not in acute distress.    Interventions: Cervical collar in place.     Comments: Patient awake and alert, in c-collar  HENT:     Head: Normocephalic and atraumatic.  Cardiovascular:     Rate and Rhythm: Irregular    Heart sounds: No murmur heard.    No systolic murmur is present.     No diastolic murmur is present.  Pulmonary:     Effort: Pulmonary effort is normal.     Breath sounds: Normal breath sounds.  Abdominal:     Palpations: Abdomen is soft.     Tenderness: There is no abdominal tenderness.  Musculoskeletal: Lateral lower extremity edema Neurological: Limited range of motion involving bilateral shoulders more pronounced on the right.  Able to move bilateral lower extremity with some generalized weakness    General: No focal deficit present.     Mental Status: Mental status is at baseline   Data Reviewed:      Latest Ref Rng & Units 05/06/2023    4:24 AM 05/05/2023    2:52 AM 05/04/2023    5:22 AM  CBC  WBC 4.0 - 10.5 K/uL 8.1  10.4  11.4   Hemoglobin 13.0 - 17.0 g/dL 81.1  91.4  78.2   Hematocrit 39.0 - 52.0 % 32.9  34.9  38.7   Platelets 150 - 400 K/uL 96  108  100        Latest Ref Rng & Units 05/06/2023    4:24 AM 05/05/2023    2:52 AM 05/04/2023    5:22 AM  BMP  Glucose 70 - 99 mg/dL 956  213  086   BUN 8 - 23 mg/dL 85  78  72   Creatinine 0.61 - 1.24 mg/dL 5.78  4.69  6.29   Sodium 135  - 145 mmol/L 136  138  141   Potassium 3.5 - 5.1 mmol/L 3.9  4.0  3.8   Chloride 98 - 111 mmol/L 101  103  109   CO2 22 - 32 mmol/L 24  24  21    Calcium 8.9 - 10.3 mg/dL 7.9  7.9  7.9      Vitals:   05/06/23 1200 05/06/23 1300 05/06/23 1400 05/06/23 1500  BP: 112/67 120/74 (!) 125/109   Pulse:   80 72  Resp: 20 20 17 16   Temp:      TempSrc:      SpO2:   97% 97%  Weight:      Height:         Author: Loyce Dys, MD 05/06/2023 3:31 PM  For on call review [email or web address].

## 2023-05-06 NOTE — Consult Note (Addendum)
 NAME: [name].  DOB: [birth year]  MRN: [id number]  Date/Time: 05/06/2023 4:06 PM  REQUESTING PROVIDER: Dr.Djan Subjective:  REASON FOR CONSULT: Staph aureus bacteremia ? [name]. is a 78 y.o. with a history of HTN, AFIB, CAD, Stage 3 kidney disease, DM, h/o sigmoid ca s/p colectomy presents to the ED after being found on the floor at home on 05/03/23. As per patient he went to be bed the previous and does not remember anything until his wife found him on the floor at 10am. His wife saw him lying on the floor on his stomach and  he was awake but could not get up. Pt has been feeling weak the past few days. Pt has chronic leg wounds and is followed at the wound center. Last seen on 3/25 and they felt the wounds were significantly better  05/03/23 16:22  BP 105/75  Temp 98 F (36.7 C)  Pulse Rate 121 !  Resp 16  SpO2 96 %    Latest Reference Range & Units 05/03/23 16:30  WBC 4.0 - 10.5 K/uL 14.7 (H)  Hemoglobin 13.0 - 17.0 g/dL 21.3  HCT 08.6 - 57.8 % 45.2  Platelets 150 - 400 K/uL 124 (L)  Creatinine 0.61 - 1.24 mg/dL 4.69 (H)   Blood culture sent Ct head no acute finding Ct cervical spine shopwed mild irregularity of the C4-C5 and c6-C7 endplates. There was prevertebral fluid and swelling extending from c2-c6 And degenerative changes at multiple levels He underwent MRI of the cervical spine with contrast and that showed large volume prevertebral edema in the upper cervical spine and edema in C4-C5 disc space. There was also possible thinning VS disruption of the posterior longitudinal ligamen at the level and thin epidural collection extending from this level to the thoracic spine- IR did nto think there was any fluid to aspirate. 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 moderately differentiated stage IIIc (T4bN2a M0) adenocarcinoma of colon  s/p colectomy followed by chemoRx with oxaliplatin & fluorouacil.   Anemia    CAD (coronary artery disease)    a. 02/2015 Cath: LM nl, LAD 50/40 mid/distal, LCX 80p, RCA 40p, 30d, RPL1 40, CO 3.27, CI 1.65; b. 06/09/15 PCI: LM minor irregs, m-dLAD 50%, dLAD 40%, pLCx 80% (s/p PCI/DES 0%), pRCA 40%, dRCA 30%, 1st RPLB 40%   Chronic kidney disease, stage 3a (HCC)    Chronic systolic CHF (congestive heart failure) (HCC)    a. 02/2015 Echo: EF 20-25%, diff HK, mod MR, mildy dil LA, mildly dil RV w/ mod RV syst dysfxn, mildly dil RA, mod TR, PASP ; b. 09/2017 Echo: EF 40-45%, diff HK. Gr1 DD. Mild to mod MR. Mildly to mod dil LA; c. 09/2019 Echo: EF 25-30%, glob HK, mod reduced RV fxn, sev elev PASP. Mod dil LA. Mild-mod MR; d. 05/2021 Echo: EF 20-25%, glob HK, sev red RV fxn, sev BAE, sev MR/TR/TS, triv AI.   Diabetes mellitus without complication (HCC)    Essential hypertension    Hypertensive heart disease    Left inguinal  hernia 05/2020   Mixed Ischemic and Non-ischemic Cardiomyopathy    a. 02/2015 EF 20-25%, diff HK; b. 09/2016 EF 40-45%; c. 09/2019 EF 25-30%; d. 05/2021 Echo: EF 20-25%, glob HK.   Persistent atrial fibrillation (HCC)    a. Dx 05/2021-->CHA2DS2VASc = 5-->eliquis.   Pulmonary hypertension (HCC)    Severe mitral regurgitation    a. 09/2017 Echo: Mild to mod MR; b. 09/2019 Echo: mild-mod MR; c. 05/2021 Echo: Sev MR/TR/TS.   Tubular adenoma of colon     Past Surgical History:  Procedure Laterality Date   APPENDECTOMY N/A 06/19/2017   Location: [facility]; Surgeon: Claude Manges, MD   CARDIAC CATHETERIZATION Bilateral 02/24/2015   Procedure: Right/Left Heart Cath and Coronary Angiography;  Surgeon: Iran Ouch, MD;  Location: [facility]  INVASIVE CV LAB;  Service: Cardiovascular;  Laterality: Bilateral;   CARDIAC CATHETERIZATION N/A 06/09/2015   Procedure: Coronary Stent Intervention (3.0 x 12 mm Xience Alpine DES to LCx);  Surgeon: Iran Ouch, MD;  Location: [facility] INVASIVE CV LAB;  Service: Cardiovascular;  Laterality: N/A   COLECTOMY  06/19/2017   Descending and proximal sigmoid colectomy, LEFT ureterolysis, partial cecectomy, appendectomy; Location: [facility]; Surgeon: Claude Manges, MD   COLONOSCOPY     COLONOSCOPY WITH PROPOFOL N/A 12/11/2016   Procedure: COLONOSCOPY WITH PROPOFOL;  Surgeon: Christena Deem, MD;  Location: [facility] ENDOSCOPY;  Service: Endoscopy;  Laterality: N/A;   ESOPHAGOGASTRODUODENOSCOPY     HERNIA REPAIR     PORT-A-CATH REMOVAL N/A 07/12/2014   Procedure: REMOVAL PORT-A-CATH;  Surgeon: Duwaine Maxin, MD;  Location: [facility] ORS;  Service: General;  Laterality: N/A;   PORTACATH PLACEMENT Left 2015    Social History   Socioeconomic History   Marital status: Married    Spouse name: Carney Bern   Number of children: 4   Years of education: Not on file   Highest education level: Not on file  Occupational History   Occupation: retired  Tobacco Use   Smoking status: Never   Smokeless tobacco: Never  Vaping Use   Vaping status: Never Used  Substance and Sexual Activity   Alcohol use: Not Currently    Comment: none in many years   Drug use: No   Sexual activity: Yes  Other Topics Concern   Not on file  Social History Narrative   Not on file   Social Drivers of Health   Financial Resource Strain: Low Risk  (10/17/2022)   Overall Financial Resource Strain (CARDIA)    Difficulty of Paying Living Expenses: Not hard at all  Food Insecurity: No Food Insecurity (05/04/2023)   Hunger Vital Sign    Worried About Running Out of Food in the Last Year: Never true    Ran Out of Food in the Last Year: Never true  Transportation Needs: No Transportation Needs (05/04/2023)   PRAPARE - Therapist, art (Medical): No    Lack of Transportation (Non-Medical): No  Physical Activity: Sufficiently Active (10/17/2022)   Exercise Vital Sign    Days of Exercise per Week: 4 days    Minutes of Exercise per Session: 60 min  Stress: No Stress Concern Present (10/17/2022)   [employer] of Occupational Health - Occupational Stress Questionnaire    Feeling of Stress : Not at all  Social Connections: Moderately Isolated (05/04/2023)   Social Connection and Isolation Panel [NHANES]    Frequency of Communication with Friends and Family: Never    Frequency of Social Gatherings with Friends and Family: Once  a week    Attends Religious Services: More than 4 times per year    Active Member of Clubs or Organizations: No    Attends Banker Meetings: Never    Marital Status: Married  Catering manager Violence: Not At Risk (05/04/2023)   Humiliation, Afraid, Rape, and Kick questionnaire    Fear of Current or Ex-Partner: No    Emotionally Abused: No    Physically Abused: No    Sexually Abused: No    Family History  Problem Relation Age of Onset   Cancer Mother    Other Father        unknown medical history   Allergies  Allergen Reactions   Shellfish Allergy Other (See Comments) and Hives    Pt. instructed by MD to avoid seafood   I? Current Facility-Administered Medications  Medication Dose Route Frequency Provider Last Rate Last Admin   0.9 %  sodium chloride infusion   Intravenous Continuous Clegg, Amy D, NP   Held at 05/06/23 1050   acetaminophen (TYLENOL) tablet 650 mg  650 mg Oral Q6H PRN Andris Baumann, MD   650 mg at 05/06/23 0346   Or   acetaminophen (TYLENOL) suppository 650 mg  650 mg Rectal Q6H PRN Andris Baumann, MD       albuterol (PROVENTIL) (2.5 MG/3ML) 0.083% nebulizer solution 2.5 mg  2.5 mg Nebulization Q2H PRN Andris Baumann, MD       alum & mag hydroxide-simeth (MAALOX/MYLANTA) 200-200-20 MG/5ML suspension 30 mL  30 mL Oral Q4H PRN Rosezetta Schlatter T, MD   30 mL at 05/05/23 2122   ceFAZolin (ANCEF) IVPB 2g/100 mL premix  2 g Intravenous Q12H Gardner Candle, RPH   Stopped at 05/06/23 8657   Chlorhexidine Gluconate Cloth 2 % PADS 6 each  6 each Topical Daily Andris Baumann, MD   6 each at 05/06/23 0911   feeding supplement (ENSURE ENLIVE / ENSURE PLUS) liquid 237 mL  237 mL Oral BID BM Rosezetta Schlatter T, MD   237 mL at 05/06/23 0908   guaiFENesin (MUCINEX) 12 hr tablet 600 mg  600 mg Oral BID Andris Baumann, MD   600 mg at 05/06/23 8469   HYDROcodone-acetaminophen (NORCO/VICODIN) 5-325 MG per tablet 1-2 tablet  1-2 tablet Oral Q4H PRN Andris Baumann, MD       insulin aspart (novoLOG) injection 0-5 Units  0-5 Units Subcutaneous QHS Rosezetta Schlatter T, MD       insulin aspart (novoLOG) injection 0-9 Units  0-9 Units Subcutaneous TID WC Loyce Dys, MD       LORazepam (ATIVAN) injection 0.5 mg  0.5 mg Intravenous Q4H PRN Chesley Noon, MD       mupirocin ointment (BACTROBAN) 2 %   Nasal BID Andris Baumann, MD   1 Application at 05/06/23 0910   ondansetron (ZOFRAN) injection 4 mg  4 mg Intravenous Once Chesley Noon, MD       ondansetron [facility]) tablet 4 mg  4 mg Oral Q6H PRN Andris Baumann, MD       Or   ondansetron [facility]) injection 4 mg  4 mg Intravenous Q6H PRN Andris Baumann, MD       Oral care mouth rinse  15 mL Mouth Rinse 4 times per day Andris Baumann, MD   15 mL at 05/05/23 2130   Oral care mouth rinse  15 mL Mouth Rinse PRN Loyce Dys, MD  warfarin (COUMADIN) tablet 2.5 mg  2.5 mg Oral ONCE-1600 [name], [facility]       Warfarin - Pharmacist Dosing Inpatient   Does not apply q1600 Ronnald Ramp, [facility]   Given at 05/05/23 1636     Abtx:  Anti-infectives (From admission, onward)    Start     Dose/Rate Route Frequency Ordered Stop   05/05/23 2200  ceFAZolin (ANCEF) IVPB 2g/100 mL premix        2 g 200 mL/hr over 30 Minutes Intravenous Every 12 hours 05/05/23 1211     05/04/23 1930  ceFEPIme (MAXIPIME)  2 g in sodium chloride 0.9 % 100 mL IVPB  Status:  Discontinued        2 g 200 mL/hr over 30 Minutes Intravenous Every 24 hours 05/03/23 2315 05/04/23 0854   05/04/23 1200  ceFAZolin (ANCEF) IVPB 2g/100 mL premix  Status:  Discontinued        2 g 200 mL/hr over 30 Minutes Intravenous Every 8 hours 05/04/23 0854 05/05/23 1211   05/04/23 0000  cefTRIAXone (ROCEPHIN) 2 g in sodium chloride 0.9 % 100 mL IVPB  Status:  Discontinued        2 g 200 mL/hr over 30 Minutes Intravenous Every 24 hours 05/03/23 1958 05/03/23 2235   05/04/23 0000  azithromycin (ZITHROMAX) 500 mg in sodium chloride 0.9 % 250 mL IVPB  Status:  Discontinued        500 mg 250 mL/hr over 60 Minutes Intravenous Every 24 hours 05/03/23 1958 05/03/23 2235   05/03/23 2315  vancomycin (VANCOREADY) IVPB 750 mg/150 mL        750 mg 150 mL/hr over 60 Minutes Intravenous  Once 05/03/23 2305 05/04/23 0216   05/03/23 2311  vancomycin variable dose per unstable renal function (pharmacist dosing)  Status:  Discontinued         Does not apply See admin instructions 05/03/23 2311 05/04/23 1018   05/03/23 2245  metroNIDAZOLE (FLAGYL) IVPB 500 mg  Status:  Discontinued        500 mg 100 mL/hr over 60 Minutes Intravenous Every 12 hours 05/03/23 2230 05/04/23 0854   05/03/23 1900  vancomycin (VANCOCIN) IVPB 1000 mg/200 mL premix        1,000 mg 200 mL/hr over 60 Minutes Intravenous  Once 05/03/23 1850 05/03/23 2233   05/03/23 1900  ceFEPIme (MAXIPIME) 2 g in sodium chloride 0.9 % 100 mL IVPB        2 g 200 mL/hr over 30 Minutes Intravenous  Once 05/03/23 1850 05/03/23 1954   05/03/23 1900  azithromycin (ZITHROMAX) 500 mg in sodium chloride 0.9 % 250 mL IVPB        500 mg 250 mL/hr over 60 Minutes Intravenous  Once 05/03/23 1850 05/03/23 2046       REVIEW OF SYSTEMS:  Const: negative fever, negative chills, negative weight loss Eyes: negative diplopia or visual changes, negative eye pain ENT: negative coryza, negative sore  throat Resp: negative cough, hemoptysis, dyspnea Cards: negative for chest pain, palpitations, lower extremity edema GU: negative for frequency, dysuria and hematuria GI: Negative for abdominal pain, diarrhea, bleeding, constipation Skin: nleg wounds chronic Heme: negative for easy bruising and gum/nose bleeding MW:[id number], neck pain Neurolo:negative for headaches, dizziness, vertigo, memory problems  Psych: negative for feelings of anxiety, depression  Endocrine: negative for thyroid, diabetes Allergy/Immunology- negative for any medication allergies ?  Objective:  VITALS:  BP (!) 125/109   Pulse 72   Temp 97.9 F (  36.6 C) (Oral)   Resp 16   Ht 5\' 9"  (1.753 m)   Wt 73.1 kg   SpO2 97%   BMI 23.80 kg/m   PHYSICAL EXAM:  General: Alert, cooperative, no distress, cervical collar  Head: Normocephalic, without obvious abnormality, atraumatic. Eyes: Conjunctivae clear, anicteric sclerae. Pupils are equal ENT cannot examine well  Neck: , symmetrical, no adenopathy, thyroid: non tender no carotid bruit and no JVD.  Lungs: b/l air entry Heart: irregular Abdomen: Soft, Extremities: chronic venous changes to legs The wounds have all healed except for a small wound on the left foot dorsum       Skin: as above Lymph: Cervical, supraclavicular normal. Neurologic:not examined in detail Pertinent Labs Lab Results CBC    Component Value Date/Time   WBC 8.1 05/06/2023 0424   RBC 3.29 (L) 05/06/2023 0424   HGB 10.9 (L) 05/06/2023 0424   HGB 13.3 05/30/2015 1156   HCT 32.9 (L) 05/06/2023 0424   HCT 38.8 05/30/2015 1156   PLT 96 (L) 05/06/2023 0424   PLT 151 05/30/2015 1156   MCV 100.0 05/06/2023 0424   MCV 92 05/30/2015 1156   MCV 89 05/27/2014 1055   MCH 33.1 05/06/2023 0424   MCHC 33.1 05/06/2023 0424   RDW 16.7 (H) 05/06/2023 0424   RDW 15.7 (H) 05/30/2015 1156   RDW 16.8 (H) 05/27/2014 1055   LYMPHSABS 0.5 (L) 05/06/2023 0424   LYMPHSABS 0.5 (L) 05/27/2014  1055   MONOABS 1.0 05/06/2023 0424   MONOABS 0.4 05/27/2014 1055   EOSABS 0.1 05/06/2023 0424   EOSABS 0.0 05/27/2014 1055   BASOSABS 0.0 05/06/2023 0424   BASOSABS 0.0 05/27/2014 1055       Latest Ref Rng & Units 05/06/2023    4:24 AM 05/05/2023    2:52 AM 05/04/2023    5:22 AM  CMP  Glucose 70 - 99 mg/dL 846  962  952   BUN 8 - 23 mg/dL 85  78  72   Creatinine 0.61 - 1.24 mg/dL 8.41  3.24  4.01   Sodium 135 - 145 mmol/L 136  138  141   Potassium 3.5 - 5.1 mmol/L 3.9  4.0  3.8   Chloride 98 - 111 mmol/L 101  103  109   CO2 22 - 32 mmol/L 24  24  21    Calcium 8.9 - 10.3 mg/dL 7.9  7.9  7.9   Total Protein 6.5 - 8.1 g/dL 5.2   4.5   Total Bilirubin 0.0 - 1.2 mg/dL 1.8   2.9   Alkaline Phos 38 - 126 U/L 77   62   AST 15 - 41 U/L 131   187   ALT 0 - 44 U/L 35   188       Microbiology: Recent Results (from the past 240 hours)  Culture, blood (routine x 2)     Status: Abnormal   Collection Time: 05/03/23  5:27 PM   Specimen: BLOOD LEFT ARM  Result Value Ref Range Status   Specimen Description   Final    BLOOD LEFT ARM Performed at [facility], [address] [place], [place] [address]    Special Requests   Final    BOTTLES DRAWN AEROBIC AND ANAEROBIC Blood Culture results may not be optimal due to an inadequate volume of blood received in culture bottles Performed at [facility], [address] [place], [place] [address]    Culture  Setup Time   Final  GRAM POSITIVE COCCI IN BOTH AEROBIC AND ANAEROBIC BOTTLES CRITICAL RESULT CALLED TO, READ BACK BY AND VERIFIED WITH: JASON [name] @ 0538 05/04/23 LFD    Culture STAPHYLOCOCCUS AUREUS (A)  Final   Report Status 05/06/2023 FINAL  Final   Organism ID, Bacteria STAPHYLOCOCCUS AUREUS  Final      Susceptibility   Staphylococcus aureus - MIC*    CIPROFLOXACIN <=0.5 SENSITIVE Sensitive     ERYTHROMYCIN <=0.25 SENSITIVE Sensitive     GENTAMICIN <=0.5 SENSITIVE Sensitive     OXACILLIN 0.5 SENSITIVE  Sensitive     TETRACYCLINE <=1 SENSITIVE Sensitive     VANCOMYCIN 1 SENSITIVE Sensitive     TRIMETH/SULFA <=10 SENSITIVE Sensitive     CLINDAMYCIN <=0.25 SENSITIVE Sensitive     RIFAMPIN <=0.5 SENSITIVE Sensitive     Inducible Clindamycin NEGATIVE Sensitive     LINEZOLID 2 SENSITIVE Sensitive     * STAPHYLOCOCCUS AUREUS  Culture, blood (routine x 2)     Status: Abnormal   Collection Time: 05/03/23  5:27 PM   Specimen: BLOOD RIGHT ARM  Result Value Ref Range Status   Specimen Description   Final    BLOOD RIGHT ARM Performed at [facility], [address] [place], [place] [address]    Special Requests   Final    BOTTLES DRAWN AEROBIC AND ANAEROBIC Blood Culture results may not be optimal due to an inadequate volume of blood received in culture bottles Performed at [facility], [address] [place], [place] [address]    Culture  Setup Time   Final    GRAM POSITIVE COCCI IN BOTH AEROBIC AND ANAEROBIC BOTTLES CRITICAL RESULT CALLED TO, READ BACK BY AND VERIFIED WITH: JASON [name] @ 0538 05/04/23 LFD    Culture (A)  Final    STAPHYLOCOCCUS AUREUS SUSCEPTIBILITIES PERFORMED ON PREVIOUS CULTURE WITHIN THE LAST 5 DAYS. Performed at [facility], [address], [place], [place] [address]    Report Status 05/06/2023 FINAL  Final  Resp panel by RT-PCR (RSV, Flu A&B, Covid) Anterior Nasal Swab     Status: None   Collection Time: 05/03/23  5:27 PM   Specimen: Anterior Nasal Swab  Result Value Ref Range Status   SARS Coronavirus 2 by RT PCR NEGATIVE NEGATIVE Final    Comment: (NOTE) SARS-CoV-2 target nucleic acids are NOT DETECTED.  The SARS-CoV-2 RNA is generally detectable in upper respiratory specimens during the acute phase of infection. The lowest concentration of SARS-CoV-2 viral copies this assay can detect is 138 copies/mL. A negative result does not preclude SARS-Cov-2 infection and should not be used as the sole basis for treatment or other  patient management decisions. A negative result may occur with  improper specimen collection/handling, submission of specimen other than nasopharyngeal swab, presence of viral mutation(s) within the areas targeted by this assay, and inadequate number of viral copies(<138 copies/mL). A negative result must be combined with clinical observations, patient history, and epidemiological information. The expected result is Negative.  Fact Sheet for Patients:  BloggerCourse.com  Fact Sheet for Healthcare Providers:  SeriousBroker.it  This test is no t yet approved or cleared by the [place] FDA and  has been authorized for detection and/or diagnosis of SARS-CoV-2 by FDA under an Emergency Use Authorization (EUA). This EUA will remain  in effect (meaning this test can be used) for the duration of the COVID-19 declaration under Section 564(b)(1) of the Act, 21 U.S.C.section 360bbb-3(b)(1), unless the authorization is terminated  or revoked  sooner.       Influenza A by PCR NEGATIVE NEGATIVE Final   Influenza B by PCR NEGATIVE NEGATIVE Final    Comment: (NOTE) The Xpert Xpress SARS-CoV-2/FLU/RSV plus assay is intended as an aid in the diagnosis of influenza from Nasopharyngeal swab specimens and should not be used as a sole basis for treatment. Nasal washings and aspirates are unacceptable for Xpert Xpress SARS-CoV-2/FLU/RSV testing.  Fact Sheet for Patients: BloggerCourse.com  Fact Sheet for Healthcare Providers: SeriousBroker.it  This test is not yet approved or cleared by the [place] FDA and has been authorized for detection and/or diagnosis of SARS-CoV-2 by FDA under an Emergency Use Authorization (EUA). This EUA will remain in effect (meaning this test can be used) for the duration of the COVID-19 declaration under Section 564(b)(1) of the Act, 21 U.S.C. section  360bbb-3(b)(1), unless the authorization is terminated or revoked.     Resp Syncytial Virus by PCR NEGATIVE NEGATIVE Final    Comment: (NOTE) Fact Sheet for Patients: BloggerCourse.com  Fact Sheet for Healthcare Providers: SeriousBroker.it  This test is not yet approved or cleared by the [place] FDA and has been authorized for detection and/or diagnosis of SARS-CoV-2 by FDA under an Emergency Use Authorization (EUA). This EUA will remain in effect (meaning this test can be used) for the duration of the COVID-19 declaration under Section 564(b)(1) of the Act, 21 U.S.C. section 360bbb-3(b)(1), unless the authorization is terminated or revoked.  Performed at [facility], [address] [place], [place] [address]   Blood Culture ID Panel (Reflexed)     Status: Abnormal   Collection Time: 05/03/23  5:27 PM  Result Value Ref Range Status   Enterococcus faecalis NOT DETECTED NOT DETECTED Final   Enterococcus Faecium NOT DETECTED NOT DETECTED Final   Listeria monocytogenes NOT DETECTED NOT DETECTED Final   Staphylococcus species DETECTED (A) NOT DETECTED Corrected    Comment: CRITICAL RESULT CALLED TO, READ BACK BY AND VERIFIED WITH: JASON [name]@ 0538 05/04/23 LFD CORRECTED ON 03/29 AT 0740: PREVIOUSLY REPORTED AS DETECTED JASON [name]@ 6045 05/04/23 LFD    Staphylococcus aureus (BCID) DETECTED (A) NOT DETECTED Final    Comment: CRITICAL RESULT CALLED TO, READ BACK BY AND VERIFIED WITH: Princella Ion [id number] 05/04/23 LFD    Staphylococcus epidermidis NOT DETECTED NOT DETECTED Final   Staphylococcus lugdunensis NOT DETECTED NOT DETECTED Final   Streptococcus species NOT DETECTED NOT DETECTED Final   Streptococcus agalactiae NOT DETECTED NOT DETECTED Final   Streptococcus pneumoniae NOT DETECTED NOT DETECTED Final   Streptococcus pyogenes NOT DETECTED NOT DETECTED Final   A.calcoaceticus-baumannii NOT DETECTED NOT  DETECTED Final   Bacteroides fragilis NOT DETECTED NOT DETECTED Final   Enterobacterales NOT DETECTED NOT DETECTED Final   Enterobacter cloacae complex NOT DETECTED NOT DETECTED Final   Escherichia coli NOT DETECTED NOT DETECTED Final   Klebsiella aerogenes NOT DETECTED NOT DETECTED Final   Klebsiella oxytoca NOT DETECTED NOT DETECTED Final   Klebsiella pneumoniae NOT DETECTED NOT DETECTED Final   Proteus species NOT DETECTED NOT DETECTED Final   Salmonella species NOT DETECTED NOT DETECTED Final   Serratia marcescens NOT DETECTED NOT DETECTED Final   Haemophilus influenzae NOT DETECTED NOT DETECTED Final   Neisseria meningitidis NOT DETECTED NOT DETECTED Final   Pseudomonas aeruginosa NOT DETECTED NOT DETECTED Final   Stenotrophomonas maltophilia NOT DETECTED NOT DETECTED Final   Candida albicans NOT DETECTED NOT DETECTED Final   Candida auris NOT DETECTED NOT DETECTED Final   Candida glabrata  NOT DETECTED NOT DETECTED Final   Candida krusei NOT DETECTED NOT DETECTED Final   Candida parapsilosis NOT DETECTED NOT DETECTED Final   Candida tropicalis NOT DETECTED NOT DETECTED Final   Cryptococcus neoformans/gattii NOT DETECTED NOT DETECTED Final   Meth resistant mecA/C and MREJ NOT DETECTED NOT DETECTED Final    Comment: Performed at [facility], [address] [place], [place] [address]  MRSA Next Gen by PCR, Nasal     Status: Abnormal   Collection Time: 05/04/23  4:13 AM   Specimen: Nasal Mucosa; Nasal Swab  Result Value Ref Range Status   MRSA by PCR Next Gen DETECTED (A) NOT DETECTED Final    Comment: RESULT CALLED TO, READ BACK BY AND VERIFIED WITH: Fabian Sharp RN ICU @ [phone] 05/04/23 LFD (NOTE) The GeneXpert MRSA Assay (FDA approved for NASAL specimens only), is one component of a comprehensive MRSA colonization surveillance program. It is not intended to diagnose MRSA infection nor to guide or monitor treatment for MRSA infections. Test performance is not FDA  approved in patients less than 29 years old. Performed at [facility], [address] [place], [place] [address]   Culture, blood (Routine X 2) w Reflex to ID Panel     Status: None (Preliminary result)   Collection Time: 05/05/23  2:52 AM   Specimen: BLOOD RIGHT HAND  Result Value Ref Range Status   Specimen Description BLOOD RIGHT HAND  Final   Special Requests   Final    BOTTLES DRAWN AEROBIC AND ANAEROBIC Blood Culture results may not be optimal due to an inadequate volume of blood received in culture bottles   Culture   Final    NO GROWTH 1 DAY Performed at [facility], [address] [place], [place] [address]    Report Status PENDING  Incomplete  Culture, blood (Routine X 2) w Reflex to ID Panel     Status: None (Preliminary result)   Collection Time: 05/05/23  3:00 AM   Specimen: BLOOD LEFT HAND  Result Value Ref Range Status   Specimen Description BLOOD LEFT HAND  Final   Special Requests   Final    BOTTLES DRAWN AEROBIC AND ANAEROBIC Blood Culture results may not be optimal due to an inadequate volume of blood received in culture bottles   Culture   Final    NO GROWTH 1 DAY Performed at [facility], [address], [place], [place] [address]    Report Status PENDING  Incomplete    IMAGING RESULTS:  I have personally reviewed the films  large volume prevertebral edema in the upper cervical spine and edema in C4-C5 disc space. There was also possible thinning VS disruption of the posterior longitudinal ligament at the level and thin epidural collection extending from this level to the thoracic spine-  Impression/Recommendation ?Staph aureus bacteremia- source likely foot wound Culture sent from the foot wound now Pt is on cefazolin He likely has a deep focus of infection in the cervical spine IR does not have any fluid collection to aspirate Will discuss with neurosurgery He is awaiting TEE to r/o endocarditis Will need long term  antibiotic  Degenerative disc disease of cervical spine With some thinning VS disruption of the longitudinal ligamnet- has a cervical collar  Fall at home Was not aware how he fell or why?  AKI on CKD Abnormal LFTs improving   Rhabdomyolysis due to Fall and on the floor  CK improving  CHF- EF 20-25%  Anemia  Afib - pt on coumadin  Chronic venous changes legs with healed wounds except one  DM on insulin   H/o sigmoid ca- s/p surgery H/o chemo   ? I have personally spent  75---minutes involved in face-to-face and non-face-to-face activities for this patient on the day of the visit. Professional time spent includes the following activities: Preparing to see the patient (review of tests), Obtaining and/or reviewing separately obtained history (admission/discharge record), Performing a medically appropriate examination and/or evaluation , Ordering medications/tests/procedures, referring and communicating with other health care professionals, Documenting clinical information in the EMR, Independently interpreting results (not separately reported), Communicating results to the patient/family/caregiver, Counseling and educating the patient/family/caregiver and Care coordination (not separately reported) This involved complex antimicrobial management.    ________________________________________________ Discussed with patient, requesting provider

## 2023-05-06 NOTE — Inpatient Diabetes Management (Signed)
 Inpatient Diabetes Program Recommendations  AACE/ADA: New Consensus Statement on Inpatient Glycemic Control  Target Ranges:  Prepandial:   less than 140 mg/dL      Peak postprandial:   less than 180 mg/dL (1-2 hours)      Critically ill patients:  140 - 180 mg/dL    Latest Reference Range & Units 05/04/23 04:03 05/06/23 11:35  Glucose-Capillary 70 - 99 mg/dL 161 (H) 096 (H)    Latest Reference Range & Units 05/03/23 16:30 05/04/23 05:22 05/05/23 02:52 05/06/23 04:24  Glucose 70 - 99 mg/dL 045 (H) 409 (H) 811 (H) 209 (H)   Review of Glycemic Control  Diabetes history: DM2 Outpatient Diabetes medications: Farxiga 10 mg QAM, Metformin 500 mg BID (not taking) Current orders for Inpatient glycemic control: None  Inpatient Diabetes Program Recommendations:    Insulin:  Lab glucose 209 mg/dl today and finger stick CBG 272 mg/dl at 91:47 today.  Please consider ordering CBGs AC&HS with Novolog 0-9 units TID with meals and Novolog 0-5 units at bedtime.  Thanks, Orlando Penner, RN, MSN, CDCES Diabetes Coordinator Inpatient Diabetes Program [phone] (Team Pager from 8am to 5pm)

## 2023-05-06 NOTE — Consult Note (Addendum)
 Advanced Heart Failure Team Consult Note   Primary Physician: Glori Luis, MD (Inactive) Cardiologist:  Lorine Bears, MD HF MD: Dr Gala Romney   Reason for Consultation: Heart Failure   HPI:    Christopher Nay. is seen today for evaluation of heart failure at the request of Dr Cristal Deer.   [name] is a 78 year old with CAD status circumflex stenting, mixed ICM and NICM, HTN, HLD, CKD Stage III, colon cancer, chronic LLE wound, and Biventricular HF.   Followed by Dr Gala Romney in the HF clinic 04/16/23. ICD was discussed but he was not interested. [name] was cost prohibitive. He has some edema but wanted to continue on torsemide 20 mg daily.    Prior to admit he had several days of weakness. His wife found him on the floor 3/28. Unwitnessed fall. He has has no idea what happened.   Admitted after his wife found him on the floor. CT head negative. CT  spine - preverterbral fluid  MRI thin epidural fluid collection , stenosis C3-C6, and possible ligamentous injury.  Pertinent admission labs include creatinine 2.9 Lactic acid >9-->2.2, bnp >1500.  CK Blood CX + MSSA, respiratory panel negative.  Placed on ancef. Neurosurgery consulted. Plan to continue cervical collar.   Echo EF 20-2% RV severely reduced.   Home Medications Prior to Admission medications   Medication Sig Start Date End Date Taking? Authorizing Provider  atorvastatin (LIPITOR) 40 MG tablet TAKE 1 TABLET(40 MG) BY MOUTH DAILY 12/21/22  Yes Iran Ouch, MD  carvedilol (COREG) 3.125 MG tablet TAKE 1 TABLET BY MOUTH 2 TIMES DAILY WITH A MEAL 03/25/23  Yes Iran Ouch, MD  cholecalciferol (VITAMIN D3) 25 MCG (1000 UNIT) tablet Take 1,000 Units by mouth daily.   Yes [provider]  FARXIGA 10 MG TABS tablet TAKE 1 TABLET(10 MG) BY MOUTH DAILY BEFORE BREAKFAST 12/26/22  Yes Bensimhon, Bevelyn Buckles, MD  ferrous sulfate 325 (65 FE) MG tablet Take 325 mg by mouth daily with breakfast.   Yes [provider]  gabapentin (NEURONTIN) 100 MG capsule Take 100 mg by mouth 3 (three) times daily. Taking 2 -100 mg pills in am and 1 -100 mg at night 04/12/21 05/04/23 Yes [provider]  loratadine (CLARITIN) 10 MG tablet TAKE 1 TABLET(10 MG) BY MOUTH DAILY 05/25/22  Yes Glori Luis, MD  losartan (COZAAR) 100 MG tablet Take 1 tablet (100 mg total) by mouth daily. 08/21/22  Yes Bensimhon, Bevelyn Buckles, MD  Magnesium Oxide 400 MG CAPS Take 1 capsule (400 mg total) by mouth daily. 12/12/21  Yes Creig Hines, NP  spironolactone (ALDACTONE) 25 MG tablet TAKE 1 TABLET(25 MG) BY MOUTH DAILY 03/13/22  Yes Clarisa Kindred A, FNP  torsemide (DEMADEX) 20 MG tablet Take 1 tablet (20 mg total) by mouth daily. 02/27/23  Yes Bensimhon, Bevelyn Buckles, MD  warfarin (COUMADIN) 5 MG tablet TAKE 1/2 TO 1 TABLET BY MOUTH EVERY DAY AS DIRECTED BY COUMADIN CLINIC 12/26/22  Yes Iran Ouch, MD  amLODipine (NORVASC) 5 MG tablet Take 5 mg by mouth daily. Patient not taking: Reported on 05/04/2023    [provider]  Blood Glucose Monitoring Suppl DEVI 1 each by Does not apply route daily. May substitute to any manufacturer covered by patient's insurance. 01/23/23   Glori Luis, MD  Glucose Blood (BLOOD GLUCOSE TEST STRIPS) STRP 1 each by In Vitro route daily as needed. May substitute to any manufacturer covered by  patient's insurance. 01/23/23   Glori Luis, MD  Lancet Device MISC 1 each by Does not apply route daily as needed. May substitute to any manufacturer covered by patient's insurance. 01/23/23   Glori Luis, MD  Lancets Misc. MISC 1 each by Does not apply route daily as needed. May substitute to any manufacturer covered by patient's insurance. 01/23/23   Glori Luis, MD  metFORMIN (GLUCOPHAGE) 500 MG tablet Take 500 mg by mouth 2 (two) times daily with a meal. Patient not taking: Reported on 05/04/2023    [provider]  potassium chloride SA (KLOR-CON M)  20 MEQ tablet TAKE 2 TABLETS(40 MEQ) BY MOUTH DAILY Patient not taking: Reported on 05/04/2023 04/23/22   Creig Hines, NP    Past Medical History: Past Medical History:  Diagnosis Date   Adenocarcinoma of colon (HCC) 06/19/2013   a. moderately differentiated stage IIIc (T4bN2a M0) adenocarcinoma of colon  s/p colectomy followed by chemoRx with oxaliplatin & fluorouacil.   Anemia    CAD (coronary artery disease)    a. 02/2015 Cath: LM nl, LAD 50/40 mid/distal, LCX 80p, RCA 40p, 30d, RPL1 40, CO 3.27, CI 1.65; b. 06/09/15 PCI: LM minor irregs, m-dLAD 50%, dLAD 40%, pLCx 80% (s/p PCI/DES 0%), pRCA 40%, dRCA 30%, 1st RPLB 40%   Chronic kidney disease, stage 3a (HCC)    Chronic systolic CHF (congestive heart failure) (HCC)    a. 02/2015 Echo: EF 20-25%, diff HK, mod Christopher, mildy dil LA, mildly dil RV w/ mod RV syst dysfxn, mildly dil RA, mod TR, PASP ; b. 09/2017 Echo: EF 40-45%, diff HK. Gr1 DD. Mild to mod Christopher. Mildly to mod dil LA; c. 09/2019 Echo: EF 25-30%, glob HK, mod reduced RV fxn, sev elev PASP. Mod dil LA. Mild-mod Christopher; d. 05/2021 Echo: EF 20-25%, glob HK, sev red RV fxn, sev BAE, sev Christopher/TR/TS, triv AI.   Diabetes mellitus without complication (HCC)    Essential hypertension    Hypertensive heart disease    Left inguinal hernia 05/2020   Mixed Ischemic and Non-ischemic Cardiomyopathy    a. 02/2015 EF 20-25%, diff HK; b. 09/2016 EF 40-45%; c. 09/2019 EF 25-30%; d. 05/2021 Echo: EF 20-25%, glob HK.   Persistent atrial fibrillation (HCC)    a. Dx 05/2021-->CHA2DS2VASc = 5-->eliquis.   Pulmonary hypertension (HCC)    Severe mitral regurgitation    a. 09/2017 Echo: Mild to mod Christopher; b. 09/2019 Echo: mild-mod Christopher; c. 05/2021 Echo: Sev Christopher/TR/TS.   Tubular adenoma of colon     Past Surgical History: Past Surgical History:  Procedure Laterality Date   APPENDECTOMY N/A 06/19/2017   Location: [facility]; Surgeon: Claude Manges, MD   CARDIAC CATHETERIZATION Bilateral 02/24/2015    Procedure: Right/Left Heart Cath and Coronary Angiography;  Surgeon: Iran Ouch, MD;  Location: [facility] INVASIVE CV LAB;  Service: Cardiovascular;  Laterality: Bilateral;   CARDIAC CATHETERIZATION N/A 06/09/2015   Procedure: Coronary Stent Intervention (3.0 x 12 mm Xience Alpine DES to LCx);  Surgeon: Iran Ouch, MD;  Location: [facility] INVASIVE CV LAB;  Service: Cardiovascular;  Laterality: N/A   COLECTOMY  06/19/2017   Descending and proximal sigmoid colectomy, LEFT ureterolysis, partial cecectomy, appendectomy; Location: [facility]; Surgeon: Claude Manges, MD   COLONOSCOPY     COLONOSCOPY WITH PROPOFOL N/A 12/11/2016   Procedure: COLONOSCOPY WITH PROPOFOL;  Surgeon: Christena Deem, MD;  Location: [facility] ENDOSCOPY;  Service: Endoscopy;  Laterality: N/A;   ESOPHAGOGASTRODUODENOSCOPY     HERNIA  REPAIR     PORT-A-CATH REMOVAL N/A 07/12/2014   Procedure: REMOVAL PORT-A-CATH;  Surgeon: Duwaine Maxin, MD;  Location: [facility] ORS;  Service: General;  Laterality: N/A;   PORTACATH PLACEMENT Left 2015    Family History: Family History  Problem Relation Age of Onset   Cancer Mother    Other Father        unknown medical history    Social History: Social History   Socioeconomic History   Marital status: Married    Spouse name: [name]   Number of children: 4   Years of education: Not on file   Highest education level: Not on file  Occupational History   Occupation: retired  Tobacco Use   Smoking status: Never   Smokeless tobacco: Never  Vaping Use   Vaping status: Never Used  Substance and Sexual Activity   Alcohol use: Not Currently    Comment: none in many years   Drug use: No   Sexual activity: Yes  Other Topics Concern   Not on file  Social History Narrative   Not on file   Social Drivers of Health   Financial Resource Strain: Low Risk  (10/17/2022)   Overall Financial Resource Strain (CARDIA)    Difficulty of Paying Living Expenses: Not hard at all  Food Insecurity:  No Food Insecurity (05/04/2023)   Hunger Vital Sign    Worried About Running Out of Food in the Last Year: Never true    Ran Out of Food in the Last Year: Never true  Transportation Needs: No Transportation Needs (05/04/2023)   PRAPARE - Administrator, Civil Service (Medical): No    Lack of Transportation (Non-Medical): No  Physical Activity: Sufficiently Active (10/17/2022)   Exercise Vital Sign    Days of Exercise per Week: 4 days    Minutes of Exercise per Session: 60 min  Stress: No Stress Concern Present (10/17/2022)   [employer] of Occupational Health - Occupational Stress Questionnaire    Feeling of Stress : Not at all  Social Connections: Moderately Isolated (05/04/2023)   Social Connection and Isolation Panel [NHANES]    Frequency of Communication with Friends and Family: Never    Frequency of Social Gatherings with Friends and Family: Once a week    Attends Religious Services: More than 4 times per year    Active Member of [employer] or Organizations: No    Attends Banker Meetings: Never    Marital Status: Married    Allergies:  Allergies  Allergen Reactions   Shellfish Allergy Other (See Comments) and Hives    Pt. instructed by MD to avoid seafood    Objective:    Vital Signs:   Temp:  [97.3 F (36.3 C)-98.4 F (36.9 C)] 97.7 F (36.5 C) (03/31 0755) Pulse Rate:  [40-88] 79 (03/31 0630) Resp:  [0-38] 22 (03/31 0643) BP: (86-109)/(55-86) 109/77 (03/31 0643) SpO2:  [94 %-100 %] 100 % (03/31 0630) Last BM Date : 05/05/23  Weight change: Filed Weights   05/03/23 1622 05/04/23 0406  Weight: 75.4 kg 73.1 kg    Intake/Output:   Intake/Output Summary (Last 24 hours) at 05/06/2023 0759 Last data filed at 05/06/2023 0615 Gross per 24 hour  Intake 527.86 ml  Output 950 ml  Net -422.14 ml      Physical Exam    General:  NAD HEENT: normal Neck: supple. JVP unable to assess Carotids 2+ bilat; no bruits. No lymphadenopathy or  thyromegaly appreciated. Cor: PMI nondisplaced. Irregular  rate & rhythm. No rubs, gallops or murmurs. Lungs: clear Abdomen: soft, nontender, nondistended. No hepatosplenomegaly. No bruits or masses. Good bowel sounds. Extremities: no cyanosis, clubbing, rash, edema Neuro: alert & orientedx3, cranial nerves grossly intact. moves all 4 extremities w/o difficulty. Affect pleasant   Telemetry   A fib   EKG    Afib RVR   Labs   Basic Metabolic Panel: Recent Labs  Lab 05/03/23 1630 05/04/23 0522 05/05/23 0252 05/06/23 0424  NA 143 141 138 136  K 3.9 3.8 4.0 3.9  CL 103 109 103 101  CO2 22 21* 24 24  GLUCOSE 180* 204* 274* 209*  BUN 72* 72* 78* 85*  CREATININE 2.89* 2.39* 2.40* 2.57*  CALCIUM 8.9 7.9* 7.9* 7.9*    Liver Function Tests: Recent Labs  Lab 05/03/23 1630 05/04/23 0522 05/06/23 0424  AST 301* 187* 131*  ALT 282* 188* 35  ALKPHOS 96 62 77  BILITOT 4.1* 2.9* 1.8*  PROT 6.8 4.5* 5.2*  ALBUMIN 3.0* 2.0* 2.5*   Recent Labs  Lab 05/03/23 1727  LIPASE 100*   No results for input(s): "AMMONIA" in the last 168 hours.  CBC: Recent Labs  Lab 05/03/23 1630 05/04/23 0522 05/05/23 0252 05/06/23 0424  WBC 14.7* 11.4* 10.4 8.1  NEUTROABS  --   --  8.9* 6.5  HGB 14.3 12.5* 11.1* 10.9*  HCT 45.2 38.7* 34.9* 32.9*  MCV 104.9* 102.1* 102.6* 100.0  PLT 124* 100* 108* 96*    Cardiac Enzymes: Recent Labs  Lab 05/03/23 1727 05/04/23 0522  CKTOTAL 2,978* 1,619*    BNP: BNP (last 3 results) Recent Labs    08/21/22 1142 12/13/22 1140 04/16/23 1541  BNP 791.1* 2,083.6* 1,510.6*    ProBNP (last 3 results) No results for input(s): "PROBNP" in the last 8760 hours.   CBG: Recent Labs  Lab 05/04/23 0403  GLUCAP 189*    Coagulation Studies: Recent Labs    05/03/23 1727 05/04/23 0522 05/05/23 0252 05/06/23 0424  LABPROT 51.0* 62.0* 26.2* 28.2*  INR 5.6* 7.2* 2.4* 2.6*     Imaging   ECHOCARDIOGRAM COMPLETE Result Date: 05/05/2023     ECHOCARDIOGRAM REPORT   Patient Name:   Djon Tith. Date of Exam: 05/05/2023 Medical Rec #:  [id number]         Height:       69.0 in Accession #:    [id number]        Weight:       161.2 lb Date of Birth:  01/25/1946        BSA:          1.885 m Patient Age:    77 years          BP:           83/55 mmHg Patient Gender: M                 HR:           86 bpm. Exam Location:  [facility] Procedure: 2D Echo, Cardiac Doppler and Color Doppler (Both Spectral and Color            Flow Doppler were utilized during procedure). Indications:     Bacteremia R78.81  History:         Patient has prior history of Echocardiogram examinations, most                  recent 05/30/2021.  Sonographer:     Overton Mam RDCS, FASE Referring  Phys:  [id number] Andris Baumann Diagnosing Phys: Jodelle Red MD IMPRESSIONS  1. Left ventricular ejection fraction, by estimation, is 20 to 25%. The left ventricle has severely decreased function. The left ventricle demonstrates global hypokinesis. The left ventricular internal cavity size was mildly dilated. There is moderate concentric left ventricular hypertrophy. Left ventricular diastolic function could not be evaluated.  2. Right ventricular systolic function is severely reduced. The right ventricular size is mildly enlarged. There is severely elevated pulmonary artery systolic pressure.  3. Left atrial size was moderately dilated.  4. Right atrial size was severely dilated.  5. A small pericardial effusion is present. The pericardial effusion is posterior and lateral to the left ventricle. There is no evidence of cardiac tamponade.  6. The mitral valve is normal in structure. Mild to moderate mitral valve regurgitation. No evidence of mitral stenosis.  7. Tricuspid valve regurgitation is moderate to severe.  8. The aortic valve is tricuspid. There is mild calcification of the aortic valve. There is mild thickening of the aortic valve. Aortic valve regurgitation is not visualized.  Aortic valve sclerosis is present, with no evidence of aortic valve stenosis.  9. The inferior vena cava is dilated in size with <50% respiratory variability, suggesting right atrial pressure of 15 mmHg. Comparison(s): No significant change from prior study. Conclusion(s)/Recommendation(s): No evidence of valvular vegetations on this transthoracic echocardiogram. Consider a transesophageal echocardiogram to exclude infective endocarditis if clinically indicated. FINDINGS  Left Ventricle: Left ventricular ejection fraction, by estimation, is 20 to 25%. The left ventricle has severely decreased function. The left ventricle demonstrates global hypokinesis. The left ventricular internal cavity size was mildly dilated. There is moderate concentric left ventricular hypertrophy. Left ventricular diastolic function could not be evaluated due to atrial fibrillation. Left ventricular diastolic function could not be evaluated. Right Ventricle: The right ventricular size is mildly enlarged. No increase in right ventricular wall thickness. Right ventricular systolic function is severely reduced. There is severely elevated pulmonary artery systolic pressure. The tricuspid regurgitant velocity is 3.62 m/s, and with an assumed right atrial pressure of 15 mmHg, the estimated right ventricular systolic pressure is 67.4 mmHg. Left Atrium: Left atrial size was moderately dilated. Right Atrium: Right atrial size was severely dilated. Pericardium: A small pericardial effusion is present. The pericardial effusion is posterior and lateral to the left ventricle. There is no evidence of cardiac tamponade. Mitral Valve: The mitral valve is normal in structure. Mild to moderate mitral valve regurgitation. No evidence of mitral valve stenosis. Tricuspid Valve: The tricuspid valve is normal in structure. Tricuspid valve regurgitation is moderate to severe. No evidence of tricuspid stenosis. Aortic Valve: The aortic valve is tricuspid. There is  mild calcification of the aortic valve. There is mild thickening of the aortic valve. Aortic valve regurgitation is not visualized. Aortic valve sclerosis is present, with no evidence of aortic valve stenosis. Aortic valve peak gradient measures 7.7 mmHg. Pulmonic Valve: The pulmonic valve was grossly normal. Pulmonic valve regurgitation is trivial. No evidence of pulmonic stenosis. Aorta: The aortic root and ascending aorta are structurally normal, with no evidence of dilitation. Venous: The inferior vena cava is dilated in size with less than 50% respiratory variability, suggesting right atrial pressure of 15 mmHg. IAS/Shunts: The atrial septum is grossly normal. Additional Comments: There is a small pleural effusion in both left and right lateral regions.  LEFT VENTRICLE PLAX 2D LVIDd:         5.50 cm      Diastology LVIDs:  4.90 cm      LV e' medial:    5.98 cm/s LV PW:         1.50 cm      LV E/e' medial:  13.1 LV IVS:        1.40 cm      LV e' lateral:   9.03 cm/s LVOT diam:     2.30 cm      LV E/e' lateral: 8.6 LV SV:         40 LV SV Index:   21 LVOT Area:     4.15 cm  LV Volumes (MOD) LV vol d, MOD A2C: 132.0 ml LV vol d, MOD A4C: 112.0 ml LV vol s, MOD A2C: 94.5 ml LV vol s, MOD A4C: 86.2 ml LV SV MOD A2C:     37.5 ml LV SV MOD A4C:     112.0 ml LV SV MOD BP:      27.6 ml RIGHT VENTRICLE RV Basal diam:  4.40 cm RV S prime:     6.74 cm/s TAPSE (M-mode): 0.9 cm LEFT ATRIUM           Index        RIGHT ATRIUM           Index LA diam:      4.80 cm 2.55 cm/m   RA Area:     28.50 cm LA Vol (A2C): 86.2 ml 45.73 ml/m  RA Volume:   105.00 ml 55.71 ml/m LA Vol (A4C): 59.9 ml 31.78 ml/m  AORTIC VALVE                 PULMONIC VALVE AV Area (Vmax): 1.74 cm     PV Vmax:       0.74 m/s AV Vmax:        139.00 cm/s  PV Peak grad:  2.2 mmHg AV Peak Grad:   7.7 mmHg LVOT Vmax:      58.30 cm/s LVOT Vmean:     38.800 cm/s LVOT VTI:       0.097 m  AORTA Ao Root diam: 3.50 cm Ao Asc diam:  3.20 cm MITRAL VALVE                TRICUSPID VALVE MV Area (PHT): 4.57 cm    TR Peak grad:   52.4 mmHg MV Decel Time: 166 msec    TR Vmax:        362.00 cm/s Christopher Peak grad: 70.6 mmHg Christopher Mean grad: 44.0 mmHg    SHUNTS Christopher Vmax:      420.00 cm/s  Systemic VTI:  0.10 m Christopher Vmean:     313.0 cm/s   Systemic Diam: 2.30 cm MV E velocity: 78.10 cm/s Jodelle Red MD Electronically signed by Jodelle Red MD Signature Date/Time: 05/05/2023/2:03:40 PM    Final      Medications:     Current Medications:  Chlorhexidine Gluconate Cloth  6 each Topical Daily   feeding supplement  237 mL Oral BID BM   guaiFENesin  600 mg Oral BID   mupirocin ointment   Nasal BID   ondansetron (ZOFRAN) IV  4 mg Intravenous Once   mouth rinse  15 mL Mouth Rinse 4 times per day   Warfarin - Pharmacist Dosing Inpatient   Does not apply q1600    Infusions:   ceFAZolin (ANCEF) IV Stopped (05/05/23 2158)      Patient Profile   [name] is a 78 year old with CAD status  circumflex stenting, mixed ICM and NICM, HTN, HLD, CKD Stage III, colon cancer, and Biventricular HF.    Assessment/Plan  1. A/C Biventricular HFrEF -Echo EF 20-25% RV severely reduced. Dilated IVC -BNP >1500.  -Place PICC and check CVP/CO-OX. If CO-OX low will need to add milrinone versus norepi. If CVP elevated will need to IV lasix.  -GDMT on hold due to hypotension.   2. Sepsis MSSA Bacteremia. Unclear source.  CT spine prevertebral fluid.  Lactic acid >9--2.2 Check lactic acid.  On ancef.  Will need TEE. If check with CCM.   3. Fall , Unwitnessed Found on the floor. CK elevated on admit.  CT head no bleeding.   4. Elevated LFTs GI consulted   5. Atrial Fib RVR  On admit RVR but now rate controlled.  May need to add IV amio.  Holding anticoagulation for now.   6. LLE Chronic Wound  7. AKI on CKD Stage IIIb Creatinine peaked at 2.9--->2.6   Length of Stay: 3  Amy Clegg, NP  05/06/2023, 7:59 AM  Advanced Heart Failure Team Pager [phone]  (M-F; 7a - 5p)  Please contact [facility] for night-coverage after hours (4p -7a ) and weekends on [email or web address]   Patient seen with NP, I formulated the plan and agree with the above note.   He was admitted after being found down on the floor, CK 2978.  Found to have MSSA bacteremia, source uncertain (?leg wounds, ?PNA, also has C4/C5 prevertebral collection concerning for discitis).  He has had SBP 90s-100s but has not required pressors.  Lactate mildly elevated 2.3.    Echo this admission unchanged, EF 20-25%, global HK, moderate LVH, severe RV dysfunction, mild-moderate Christopher, moderate-severe TR, IVC dilated.   He is in atrial fibrillation, was in atrial fibrillation at last outpatient appt as well.  HR in 80s currently.   He denies chest pain or dyspnea currently, his neck hurts.   General: NAD Neck: Unable to assess, C-spine collar in place.  Lungs: Decreased BS on left.  CV: Nondisplaced PMI.  Heart irregular S1/S2, no S3/S4, no murmur.  No peripheral edema.  No carotid bruit.  Unable to palpate pedal pulses.  Abdomen: Soft, nontender, no hepatosplenomegaly, no distention.  Skin: Left lower leg wrapped, ulcerations present per patient.  Neurologic: Alert and oriented x 3.  Psych: Normal affect. Extremities: No clubbing or cyanosis.  HEENT: Normal.   1. Acute on chronic systolic CHF: With mixed cardiogenic/septic shock. Has not been on pressors.  Last lactate 2.3.  Patient has history of primarily nonischemic cardiomyopathy.  He has refused ICD.  He has had trouble getting his HF medications.  Echo this admission was unchanged from past with EF 20-25%, global HK, moderate LVH, severe RV dysfunction, mild-moderate Christopher, moderate-severe TR, IVC dilated.  Exam is difficult for volume given c-spine collar.  Creatinine up to 2.57, suspect due to low BP with shock.  SBP currently 90s-100s.  - Place PICC line, follow CVP and co-ox.  Also send repeat lactate.  If co-ox low, would start milrinone  0.25 mcg/kg/min.  - If CVP significantly elevated, would gently diurese.  2. MSSA bacteremia: Source uncertain. ?leg wounds, ?PNA, also has C4/C5 prevertebral collection concerning for discitis.  ID following.  - Continue cefazolin - If c-spine can be cleared, would like to do TEE to formally assess for endocarditis.  3. AKI on CKD stage 3: Creatinine 2.57 today.  Suspect due to septic shock with cardiorenal component.  - As above, place PICC  today.  Will start milrinone if co-ox low.  4. Atrial fibrillation: Persistent.  He was in atrial fibrillation at last outpatient appt.  There was not a plan to cardiovert at that time.  - If we start milrinone, will also start amiodarone gtt for rate control.  - He is on warfarin.  - Consider DCCV in future, he would benefit from NSR.  5. Elevated CPK: Mild rhabdomyolysis due to fall, peak CK 2978.  6. Chronic LLE wound: Would involve wound care.  ?Source for MSSA bacteremia.  7. Elevated LFTs: Suspect shock liver, trending down.  8. H/o colon cancer 9. C-spine prevertebral edema: Upper c-spine with edema in the C4-C5 disc, ?infectious. No drainable fluid collection per IR.  - He is in a c-spine collar.  - Neurosurgery has seen. - Will need to be cleared to get TEE.   Marca Ancona 05/06/2023 9:12 AM

## 2023-05-06 NOTE — Telephone Encounter (Signed)
 Patient Product/process development scientist completed.    The patient is insured through [employer]/ [employer]. Patient has Medicare and is not eligible for a copay card, but may be able to apply for patient assistance or Medicare RX Payment Plan (Patient Must reach out to their plan, if eligible for payment plan), if available.    Ran test claim for Entresto 24-26 mg and the current 30 day co-pay is $47.00.  Ran test claim for Farxiga 10 mg and the current 30 day co-pay is $47.00.  Ran test claim for Jardiance 10 mg and the current 30 day co-pay is $47.00.  This test claim was processed through [facility]- copay amounts may vary at other pharmacies due to pharmacy/plan contracts, or as the patient moves through the different stages of their insurance plan.     Roland Earl, CPHT Pharmacy Technician III Certified Patient Advocate [facility] Patient Advocate Team Direct Number: [phone]  Fax: [phone]

## 2023-05-06 NOTE — Progress Notes (Signed)
 Peripherally Inserted Central Catheter Placement  The IV Nurse has discussed with the patient and/or persons authorized to consent for the patient, the purpose of this procedure and the potential benefits and risks involved with this procedure.  The benefits include less needle sticks, lab draws from the catheter, and the patient may be discharged home with the catheter. Risks include, but not limited to, infection, bleeding, blood clot (thrombus formation), and puncture of an artery; nerve damage and irregular heartbeat and possibility to perform a PICC exchange if needed/ordered by physician.  Alternatives to this procedure were also discussed.  Bard Power PICC patient education guide, fact sheet on infection prevention and patient information card has been provided to patient /or left at bedside.    PICC Placement Documentation  PICC Double Lumen 05/06/23 Left Basilic 42 cm 0 cm (Active)  Indication for Insertion or Continuance of Line Vasoactive infusions 05/06/23 1529  Exposed Catheter (cm) 0 cm 05/06/23 1529  Site Assessment Clean, Dry, Intact 05/06/23 1529  Lumen #1 Status Flushed;Saline locked;Blood return noted 05/06/23 1529  Lumen #2 Status Flushed;Saline locked;Blood return noted 05/06/23 1529  Dressing Type Transparent;Securing device 05/06/23 1529  Dressing Status Antimicrobial disc/dressing in place;Clean, Dry, Intact 05/06/23 1529  Line Care Connections checked and tightened 05/06/23 1529  Line Adjustment (NICU/IV Team Only) No 05/06/23 1529  Dressing Intervention New dressing;Adhesive placed at insertion site (IV team only);Other (Comment) 05/06/23 1529  Dressing Change Due 05/13/23 05/06/23 1529       Annett Fabian 05/06/2023, 3:30 PM

## 2023-05-06 NOTE — Progress Notes (Signed)
 Heart Failure Navigator Progress Note  Assessed for Heart & Vascular TOC clinic readiness.  Patient does not meet criteria due to current Advanced Heart Failure Team patient of Dr. Bevelyn Buckles. Bensimhon, MD.  Navigator will sign off at this time.  Roxy Horseman, RN, BSN [facility] Heart Failure Navigator Secure Chat Only

## 2023-05-06 NOTE — TOC Initial Note (Signed)
 Transition of Care (TOC) - Initial/Assessment Note    Patient Details  Name: [name]. MRN: [id number] Date of Birth: [birth year]  Transition of Care [facility]) CM/SW Contact:    Emeline Darling, RN Phone Number: [phone] 05/06/2023, 12:19 PM  Clinical Narrative:                 No family at bedside. Patient has on oxygen via [place] and C-collar on at time of assessment.. Patient denies use of DME and services in the home and community. Patient states that he does not drive. Patient states his wife transport him to his MD appointments. Patient states that he was getting slower in his ADLs prior to admission. Patient want to go home at time of discharge. Address verified.   Expected Discharge Plan: Home w Home Health Services     Patient Goals and CMS Choice           Expected Discharge Plan and Services       Living arrangements for the past 2 months: Single Family Home                                      Prior Living Arrangements/Services Living arrangements for the past 2 months: Single Family Home Lives with:: Spouse                   Activities of Daily Living   ADL Screening (condition at time of admission) Independently performs ADLs?: Yes (appropriate for developmental age) Is the patient deaf or have difficulty hearing?: No Does the patient have difficulty seeing, even when wearing glasses/contacts?: No Does the patient have difficulty concentrating, remembering, or making decisions?: No  Permission Sought/Granted                  Emotional Assessment Appearance:: Appears stated age Attitude/Demeanor/Rapport: Engaged Affect (typically observed): Accepting, Appropriate Orientation: : Oriented to Self, Oriented to Place, Oriented to  Time, Oriented to Situation   Psych Involvement: No (comment)  Admission diagnosis:  Elevated troponin [R79.89] Injury of neck, initial encounter [S19.9XXA] Sepsis (HCC) [A41.9] Non-traumatic  rhabdomyolysis [M62.82] Pneumonia of left lower lobe due to infectious organism [J18.9] Acute ST elevation myocardial infarction (STEMI) of inferior wall (HCC) [I21.19] Sepsis without acute organ dysfunction, due to unspecified organism [facility]) [A41.9] Patient Active Problem List   Diagnosis Date Noted   Severe sepsis (HCC) 05/03/2023   Traumatic rhabdomyolysis (HCC) 05/03/2023   Accidental fall from bed, initial encounter 05/03/2023   Supratherapeutic INR 05/03/2023   Elevated troponin 05/03/2023   Abnormal LFTs--possible biliary tract disease 05/03/2023   Pneumonia, possible 05/03/2023   Syncope, presumed 05/03/2023   Acute ST elevation myocardial infarction (STEMI) of inferior wall (HCC) 05/03/2023   Abnormal MRI, cervical spine 05/03/2023   Endocarditis 05/03/2023   Right shoulder pain 10/23/2022   Hyperkalemia 09/18/2022   Allergic rhinitis 05/25/2022   Atherosclerosis of native arteries of extremity with intermittent claudication (HCC) 03/21/2022   Long term (current) use of anticoagulants 10/25/2021   Rash in adult 10/15/2021   Atrial fibrillation with rapid ventricular response (HCC) 05/29/2021   Hydrocele, left 11/01/2020   Status post laparoscopic hernia repair 09/27/2020   Anemia in chronic kidney disease 04/08/2019   Stage 3a chronic kidney disease (HCC) 04/08/2019   Hyperlipidemia 04/28/2018   Blistering of skin 01/23/2018   Leg wound, left, initial encounter 01/02/2018   Absent pedal pulses  01/02/2018   Onychomycosis 01/02/2018   Leukopenia 09/28/2017   Thrombocytopenia (HCC) 09/28/2017   B12 deficiency 09/28/2017   Renal mass 09/10/2017   Diabetes (HCC) 09/10/2017   Leg wound, right, initial encounter 09/10/2017   Acute renal failure superimposed on stage 3b chronic kidney disease (HCC) 08/14/2017   Anemia 09/12/2015   CAD S/P percutaneous coronary angioplasty    Hypertensive heart disease    CAD (coronary artery disease)    Mixed Ischemic and Non-ischemic  Cardiomyopathy    Chronic HFrEF (heart failure with reduced ejection fraction) (HCC)    Bilateral lower extremity edema 02/10/2015   Abdominal pain 02/10/2015   Cough 02/10/2015   Neuropathy (HCC) 09/16/2014   Tubular adenoma of colon 07/20/2014   History of colon cancer 07/20/2014   Benign colon polyp 07/20/2014   Hypertension 07/20/2014   Hyperplastic colon polyp 07/20/2014   PCP:  Glori Luis, MD (Inactive) Pharmacy:   [facility] [phone] [place], [place] - [address] AT [facility] [address] [place] [address] [place] [address] Phone: [phone] Fax: [phone]     Social Drivers of Health (SDOH) Social History: SDOH Screenings   Food Insecurity: No Food Insecurity (05/04/2023)  Housing: Low Risk  (05/04/2023)  Transportation Needs: No Transportation Needs (05/04/2023)  Utilities: Not At Risk (05/04/2023)  Alcohol Screen: Low Risk  (10/17/2022)  Depression (PHQ2-9): Low Risk  (01/23/2023)  Financial Resource Strain: Low Risk  (10/17/2022)  Physical Activity: Sufficiently Active (10/17/2022)  Social Connections: Moderately Isolated (05/04/2023)  Stress: No Stress Concern Present (10/17/2022)  Tobacco Use: Low Risk  (05/04/2023)  Health Literacy: Inadequate Health Literacy (10/17/2022)   SDOH Interventions:     Readmission Risk Interventions     No data to display

## 2023-05-07 ENCOUNTER — Inpatient Hospital Stay
Admit: 2023-05-07 | Discharge: 2023-05-07 | Disposition: A | Discharge status: Home / Self Care | Attending: Cardiology | Admitting: Cardiology

## 2023-05-07 ENCOUNTER — Inpatient Hospital Stay

## 2023-05-07 ENCOUNTER — Encounter
Admission: EM | Disposition: A | Discharge status: Skilled Nursing Facility | Payer: Self-pay | Source: Home / Self Care | Attending: Internal Medicine

## 2023-05-07 ENCOUNTER — Other Ambulatory Visit: Payer: Self-pay

## 2023-05-07 ENCOUNTER — Ambulatory Visit: Admitting: Internal Medicine

## 2023-05-07 DIAGNOSIS — A419 Sepsis, unspecified organism: Secondary | ICD-10-CM | POA: Diagnosis not present

## 2023-05-07 DIAGNOSIS — M5090 Cervical disc disorder, unspecified, unspecified cervical region: Secondary | ICD-10-CM | POA: Diagnosis not present

## 2023-05-07 DIAGNOSIS — I4891 Unspecified atrial fibrillation: Secondary | ICD-10-CM | POA: Diagnosis not present

## 2023-05-07 DIAGNOSIS — B9561 Methicillin susceptible Staphylococcus aureus infection as the cause of diseases classified elsewhere: Secondary | ICD-10-CM | POA: Diagnosis not present

## 2023-05-07 DIAGNOSIS — I361 Nonrheumatic tricuspid (valve) insufficiency: Secondary | ICD-10-CM

## 2023-05-07 DIAGNOSIS — I340 Nonrheumatic mitral (valve) insufficiency: Secondary | ICD-10-CM | POA: Diagnosis not present

## 2023-05-07 DIAGNOSIS — G061 Intraspinal abscess and granuloma: Secondary | ICD-10-CM

## 2023-05-07 DIAGNOSIS — R937 Abnormal findings on diagnostic imaging of other parts of musculoskeletal system: Secondary | ICD-10-CM | POA: Diagnosis not present

## 2023-05-07 DIAGNOSIS — I5023 Acute on chronic systolic (congestive) heart failure: Secondary | ICD-10-CM | POA: Diagnosis not present

## 2023-05-07 DIAGNOSIS — R6520 Severe sepsis without septic shock: Secondary | ICD-10-CM | POA: Diagnosis not present

## 2023-05-07 DIAGNOSIS — R7881 Bacteremia: Secondary | ICD-10-CM | POA: Diagnosis not present

## 2023-05-07 HISTORY — PX: TEE WITHOUT CARDIOVERSION: SHX5443

## 2023-05-07 LAB — COMPREHENSIVE METABOLIC PANEL WITH GFR
ALT: 8 U/L (ref 0–44)
AST: 78 U/L — ABNORMAL HIGH (ref 15–41)
Albumin: 2.6 g/dL — ABNORMAL LOW (ref 3.5–5.0)
Alkaline Phosphatase: 98 U/L (ref 38–126)
Anion gap: 10 (ref 5–15)
BUN: 81 mg/dL — ABNORMAL HIGH (ref 8–23)
CO2: 24 mmol/L (ref 22–32)
Calcium: 8.1 mg/dL — ABNORMAL LOW (ref 8.9–10.3)
Chloride: 104 mmol/L (ref 98–111)
Creatinine, Ser: 2.44 mg/dL — ABNORMAL HIGH (ref 0.61–1.24)
GFR, Estimated: 27 mL/min — ABNORMAL LOW (ref >60)
Glucose, Bld: 162 mg/dL — ABNORMAL HIGH (ref 70–99)
Potassium: 3.6 mmol/L (ref 3.5–5.1)
Sodium: 138 mmol/L (ref 135–145)
Total Bilirubin: 2.2 mg/dL — ABNORMAL HIGH (ref 0.0–1.2)
Total Protein: 5.9 g/dL — ABNORMAL LOW (ref 6.5–8.1)

## 2023-05-07 LAB — COOXEMETRY PANEL
Carboxyhemoglobin: 2.3 % — ABNORMAL HIGH (ref 0.5–1.5)
Methemoglobin: <0.7 % (ref 0.0–1.5)
O2 Saturation: 63.1 %
Total hemoglobin: 13.2 g/dL (ref 12.0–16.0)
Total oxygen content: 61.3 %

## 2023-05-07 LAB — CBC WITH DIFFERENTIAL/PLATELET
Abs Immature Granulocytes: 0.07 10*3/uL (ref 0.00–0.07)
Basophils Absolute: 0 10*3/uL (ref 0.0–0.1)
Basophils Relative: 0 %
Eosinophils Absolute: 0 10*3/uL (ref 0.0–0.5)
Eosinophils Relative: 0 %
HCT: 38.4 % — ABNORMAL LOW (ref 39.0–52.0)
Hemoglobin: 12.8 g/dL — ABNORMAL LOW (ref 13.0–17.0)
Immature Granulocytes: 1 %
Lymphocytes Relative: 8 %
Lymphs Abs: 0.7 10*3/uL (ref 0.7–4.0)
MCH: 33.2 pg (ref 26.0–34.0)
MCHC: 33.3 g/dL (ref 30.0–36.0)
MCV: 99.7 fL (ref 80.0–100.0)
Monocytes Absolute: 1.3 10*3/uL — ABNORMAL HIGH (ref 0.1–1.0)
Monocytes Relative: 14 %
Neutro Abs: 7.2 10*3/uL (ref 1.7–7.7)
Neutrophils Relative %: 77 %
Platelets: 119 10*3/uL — ABNORMAL LOW (ref 150–400)
RBC: 3.85 MIL/uL — ABNORMAL LOW (ref 4.22–5.81)
RDW: 17 % — ABNORMAL HIGH (ref 11.5–15.5)
Smear Review: NORMAL
WBC: 9.3 10*3/uL (ref 4.0–10.5)
nRBC: 0 % (ref 0.0–0.2)

## 2023-05-07 LAB — GLUCOSE, CAPILLARY
Glucose-Capillary: 148 mg/dL — ABNORMAL HIGH (ref 70–99)
Glucose-Capillary: 152 mg/dL — ABNORMAL HIGH (ref 70–99)
Glucose-Capillary: 174 mg/dL — ABNORMAL HIGH (ref 70–99)
Glucose-Capillary: 166 mg/dL — ABNORMAL HIGH (ref 70–99)
Glucose-Capillary: 160 mg/dL — ABNORMAL HIGH (ref 70–99)

## 2023-05-07 LAB — PROTIME-INR
INR: 3.5 — ABNORMAL HIGH (ref 0.8–1.2)
Prothrombin Time: 35 s — ABNORMAL HIGH (ref 11.4–15.2)

## 2023-05-07 LAB — ECHO TEE

## 2023-05-07 SURGERY — ECHOCARDIOGRAM, TRANSESOPHAGEAL
Anesthesia: General

## 2023-05-07 MED ORDER — AMIODARONE HCL 200 MG PO TABS
400.0000 mg | ORAL_TABLET | Freq: Two times a day (BID) | ORAL | Status: DC
  Filled 2023-05-07: qty 2

## 2023-05-07 MED ORDER — ATORVASTATIN CALCIUM 20 MG PO TABS
40.0000 mg | ORAL_TABLET | Freq: Every day | ORAL | Status: DC
  Administered 2023-05-07 – 2023-05-14 (×8): 40 mg via ORAL
  Filled 2023-05-07 (×8): qty 2

## 2023-05-07 MED ORDER — DROPERIDOL 2.5 MG/ML IJ SOLN: 0.6250 mg | Freq: Once | INTRAMUSCULAR | Status: DC | PRN

## 2023-05-07 MED ORDER — ORAL CARE MOUTH RINSE: 15.0000 mL | OROMUCOSAL | Status: DC | PRN

## 2023-05-07 MED ORDER — ALPRAZOLAM 0.5 MG PO TABS: 0.5000 mg | ORAL_TABLET | Freq: Three times a day (TID) | ORAL | Status: DC | PRN

## 2023-05-07 MED ORDER — AMIODARONE HCL IN DEXTROSE 360-4.14 MG/200ML-% IV SOLN
30.0000 mg/h | INTRAVENOUS | Status: DC
  Administered 2023-05-07 – 2023-05-09 (×5): 30 mg/h via INTRAVENOUS
  Filled 2023-05-07 (×4): qty 200

## 2023-05-07 MED ORDER — ACETAMINOPHEN 10 MG/ML IV SOLN: 1000.0000 mg | Freq: Once | INTRAVENOUS | Status: DC | PRN

## 2023-05-07 MED ORDER — OXYCODONE HCL 5 MG PO TABS: 5.0000 mg | ORAL_TABLET | Freq: Once | ORAL | Status: DC | PRN

## 2023-05-07 MED ORDER — SODIUM CHLORIDE 0.9 % IV SOLN: INTRAVENOUS | Status: DC

## 2023-05-07 MED ORDER — WARFARIN SODIUM 2.5 MG PO TABS
2.5000 mg | ORAL_TABLET | Freq: Once | ORAL | Status: DC
  Filled 2023-05-07: qty 1

## 2023-05-07 MED ORDER — SODIUM CHLORIDE 0.9 % IV SOLN: INTRAVENOUS | Status: AC | PRN

## 2023-05-07 MED ORDER — PNEUMOCOCCAL 20-VAL CONJ VACC 0.5 ML IM SUSY: 0.5000 mL | PREFILLED_SYRINGE | INTRAMUSCULAR | Status: DC

## 2023-05-07 MED ORDER — PHENYLEPHRINE HCL-NACL 20-0.9 MG/250ML-% IV SOLN
INTRAVENOUS | Status: AC
  Filled 2023-05-07: qty 250

## 2023-05-07 MED ORDER — PROPOFOL 1000 MG/100ML IV EMUL
INTRAVENOUS | Status: AC
  Filled 2023-05-07: qty 100

## 2023-05-07 MED ORDER — LIDOCAINE VISCOUS HCL 2 % MT SOLN
OROMUCOSAL | Status: AC
  Filled 2023-05-07: qty 15

## 2023-05-07 MED ORDER — OXYCODONE HCL 5 MG/5ML PO SOLN: 5.0000 mg | Freq: Once | ORAL | Status: DC | PRN

## 2023-05-07 MED ORDER — PROPOFOL 10 MG/ML IV BOLUS
INTRAVENOUS | Status: DC | PRN
  Administered 2023-05-07: 20 mg via INTRAVENOUS
  Administered 2023-05-07: 10 mg via INTRAVENOUS
  Administered 2023-05-07: 20 mg via INTRAVENOUS
  Administered 2023-05-07 (×2): 10 mg via INTRAVENOUS
  Administered 2023-05-07: 50 mg via INTRAVENOUS
  Administered 2023-05-07 (×3): 10 mg via INTRAVENOUS

## 2023-05-07 MED ORDER — FENTANYL CITRATE (PF) 100 MCG/2ML IJ SOLN: 25.0000 ug | INTRAMUSCULAR | Status: DC | PRN

## 2023-05-07 MED ORDER — FUROSEMIDE 10 MG/ML IJ SOLN
80.0000 mg | Freq: Two times a day (BID) | INTRAMUSCULAR | Status: AC
  Administered 2023-05-07 (×2): 80 mg via INTRAVENOUS
  Filled 2023-05-07 (×2): qty 8

## 2023-05-07 MED ORDER — POTASSIUM CHLORIDE CRYS ER 20 MEQ PO TBCR
40.0000 meq | EXTENDED_RELEASE_TABLET | Freq: Once | ORAL | Status: AC
  Administered 2023-05-07: 40 meq via ORAL
  Filled 2023-05-07: qty 2

## 2023-05-07 MED ORDER — AMIODARONE HCL IN DEXTROSE 360-4.14 MG/200ML-% IV SOLN
60.0000 mg/h | INTRAVENOUS | Status: AC
  Administered 2023-05-07: 60 mg/h via INTRAVENOUS
  Filled 2023-05-07 (×3): qty 200

## 2023-05-07 MED ORDER — PHENYLEPHRINE HCL (PRESSORS) 10 MG/ML IV SOLN
INTRAVENOUS | Status: DC | PRN
  Administered 2023-05-07: 80 ug via INTRAVENOUS

## 2023-05-07 MED ORDER — BUTAMBEN-TETRACAINE-BENZOCAINE 2-2-14 % EX AERO
INHALATION_SPRAY | CUTANEOUS | Status: AC
  Filled 2023-05-07: qty 5

## 2023-05-07 NOTE — Consult Note (Signed)
 PHARMACY - ANTICOAGULATION CONSULT NOTE  Pharmacy Consult for Warfarin Indication: atrial fibrillation  Allergies  Allergen Reactions   Shellfish Allergy Other (See Comments) and Hives    Pt. instructed by MD to avoid seafood    Patient Measurements: Height: 5\' 9"  (175.3 cm) Weight: 73.1 kg (161 lb 2.5 oz) IBW/kg (Calculated) : 70.7 HEPARIN DW (KG): 73.1  Vital Signs: Temp: 99 F (37.2 C) (04/01 0400) Temp Source: Oral (04/01 0400) BP: 137/78 (04/01 0500) Pulse Rate: 97 (04/01 0500)  Labs: Recent Labs    05/05/23 0252 05/06/23 0424 05/07/23 0448  HGB 11.1* 10.9* 12.8*  HCT 34.9* 32.9* 38.4*  PLT 108* 96* 119*  LABPROT 26.2* 28.2* 35.0*  INR 2.4* 2.6* 3.5*  CREATININE 2.40* 2.57* 2.44*    Estimated Creatinine Clearance: 25.8 mL/min (A) (by C-G formula based on SCr of 2.44 mg/dL (H)).   Medical History: Past Medical History:  Diagnosis Date   Adenocarcinoma of colon (HCC) 06/19/2013   a. moderately differentiated stage IIIc (T4bN2a M0) adenocarcinoma of colon  s/p colectomy followed by chemoRx with oxaliplatin & fluorouacil.   Anemia    CAD (coronary artery disease)    a. 02/2015 Cath: LM nl, LAD 50/40 mid/distal, LCX 80p, RCA 40p, 30d, RPL1 40, CO 3.27, CI 1.65; b. 06/09/15 PCI: LM minor irregs, m-dLAD 50%, dLAD 40%, pLCx 80% (s/p PCI/DES 0%), pRCA 40%, dRCA 30%, 1st RPLB 40%   Chronic kidney disease, stage 3a (HCC)    Chronic systolic CHF (congestive heart failure) (HCC)    a. 02/2015 Echo: EF 20-25%, diff HK, mod MR, mildy dil LA, mildly dil RV w/ mod RV syst dysfxn, mildly dil RA, mod TR, PASP ; b. 09/2017 Echo: EF 40-45%, diff HK. Gr1 DD. Mild to mod MR. Mildly to mod dil LA; c. 09/2019 Echo: EF 25-30%, glob HK, mod reduced RV fxn, sev elev PASP. Mod dil LA. Mild-mod MR; d. 05/2021 Echo: EF 20-25%, glob HK, sev red RV fxn, sev BAE, sev MR/TR/TS, triv AI.   Diabetes mellitus without complication (HCC)    Essential hypertension    Hypertensive heart disease     Left inguinal hernia 05/2020   Mixed Ischemic and Non-ischemic Cardiomyopathy    a. 02/2015 EF 20-25%, diff HK; b. 09/2016 EF 40-45%; c. 09/2019 EF 25-30%; d. 05/2021 Echo: EF 20-25%, glob HK.   Persistent atrial fibrillation (HCC)    a. Dx 05/2021-->CHA2DS2VASc = 5-->eliquis.   Pulmonary hypertension (HCC)    Severe mitral regurgitation    a. 09/2017 Echo: Mild to mod MR; b. 09/2019 Echo: mild-mod MR; c. 05/2021 Echo: Sev MR/TR/TS.   Tubular adenoma of colon     Medications:  Per note from Anti-Coag visit on 04/24/23 patient taking Warfarin 2.5 mg po daily.  Assessment: 78 yo male presenting to ED by EMS due to being found in floor by wife.  PMH includes Afib on Warfarin, CAD, DM, HTN, CHF, and CKD.  Patient treated for sepsis likely due to pneumonia.  Pharmacy consulted to manage Warfarin.  Goal of Therapy:  INR 2-3 Monitor platelets by anticoagulation protocol: Yes    Date INR Warfarin Dose  3/28 5.6 Hold  3/29 7.2 Hold + vitamin IV 10 mg x 1  3/30 2.4 2.5 mg  3/31 2.6 2.5 mg  4/01 3.5 HOLD     Plan:  ---INR is supratherapeutic.  ---Will hold warfarin today ---Daily INR. CBC at least every 3 days.   [name], PharmD 05/07/2023,7:12 AM

## 2023-05-07 NOTE — Transfer of Care (Signed)
 Immediate Anesthesia Transfer of Care Note  Patient: [name].  Procedure(s) Performed: ECHOCARDIOGRAM, TRANSESOPHAGEAL  Patient Location: Cath Lab, Special recovery room 15  Anesthesia Type:General  Level of Consciousness: drowsy  Airway & Oxygen Therapy: Patient Spontanous Breathing and Patient connected to nasal cannula oxygen  Post-op Assessment: Report given to RN and Post -op Vital signs reviewed and stable  Post vital signs: Reviewed and stable  Last Vitals:  Vitals Value Taken Time  BP 143/79 05/07/23 0827  Temp 35.8 0827  Pulse 87 05/07/23 0827  Resp 28 05/07/23 0827  SpO2 100 % 05/07/23 0827    Last Pain:  Vitals:   05/07/23 0719  TempSrc: Oral  PainSc: 4       Patients Stated Pain Goal: 0 (05/05/23 1600)  Complications: No notable events documented.

## 2023-05-07 NOTE — H&P (View-Only) (Signed)
 Patient ID: [name]., male   DOB: [birth year], 78 y.o.   MRN: [id number]     Advanced Heart Failure Rounding Note  Cardiologist: Lorine Bears, MD  Chief Complaint: CHF Subjective:    Patient remains in atrial fibrillation. Creatinine 2.57 => 2.44.  SBP 120s-150s, co-ox 63%.  INR 3.5. CVP around 15.   Has cough.  Comfortable at rest.  Denies neck pain.    Objective:   Weight Range: 73.1 kg Body mass index is 23.8 kg/m.   Vital Signs:   Temp:  [97.7 F (36.5 C)-99.1 F (37.3 C)] 99 F (37.2 C) (04/01 0719) Pulse Rate:  [51-110] 104 (04/01 0719) Resp:  [16-47] 39 (04/01 0719) BP: (93-160)/(52-109) 128/97 (04/01 0719) SpO2:  [92 %-100 %] 99 % (04/01 0719) Last BM Date : 05/06/23  Weight change: Filed Weights   05/03/23 1622 05/04/23 0406  Weight: 75.4 kg 73.1 kg    Intake/Output:   Intake/Output Summary (Last 24 hours) at 05/07/2023 0732 Last data filed at 05/07/2023 0500 Gross per 24 hour  Intake 100 ml  Output 910 ml  Net -810 ml      Physical Exam    General:  Well appearing. No resp difficulty HEENT: Normal Neck: Supple. JVP 12-14 cm. Carotids 2+ bilat; no bruits. No lymphadenopathy or thyromegaly appreciated. Cor: PMI nondisplaced. Regular rate & rhythm. No rubs, gallops or murmurs. Lungs: Occasional rhonchi Abdomen: Soft, nontender, nondistended. No hepatosplenomegaly. No bruits or masses. Good bowel sounds. Extremities: No cyanosis, clubbing, rash, edema. Left leg/foot wrapped.  Neuro: Alert & orientedx3, cranial nerves grossly intact. moves all 4 extremities w/o difficulty. Affect pleasant   Telemetry   Atrial fibrillation rate 90s-100s (personally reviewed).   EKG    Atrial fibrillation, IVCD (personally reviewed)  Labs    CBC Recent Labs    05/06/23 0424 05/07/23 0448  WBC 8.1 9.3  NEUTROABS 6.5 7.2  HGB 10.9* 12.8*  HCT 32.9* 38.4*  MCV 100.0 99.7  PLT 96* 119*   Basic Metabolic Panel Recent Labs    40/98/11 0424  05/07/23 0448  NA 136 138  K 3.9 3.6  CL 101 104  CO2 24 24  GLUCOSE 209* 162*  BUN 85* 81*  CREATININE 2.57* 2.44*  CALCIUM 7.9* 8.1*   Liver Function Tests Recent Labs    05/06/23 0424 05/07/23 0448  AST 131* 78*  ALT 35 8  ALKPHOS 77 98  BILITOT 1.8* 2.2*  PROT 5.2* 5.9*  ALBUMIN 2.5* 2.6*   No results for input(s): "LIPASE", "AMYLASE" in the last 72 hours. Cardiac Enzymes No results for input(s): "CKTOTAL", "CKMB", "CKMBINDEX", "TROPONINI" in the last 72 hours.  BNP: BNP (last 3 results) Recent Labs    08/21/22 1142 12/13/22 1140 04/16/23 1541  BNP 791.1* 2,083.6* 1,510.6*    ProBNP (last 3 results) No results for input(s): "PROBNP" in the last 8760 hours.   D-Dimer No results for input(s): "DDIMER" in the last 72 hours. Hemoglobin A1C Recent Labs    05/06/23 0424  HGBA1C 6.9*   Fasting Lipid Panel No results for input(s): "CHOL", "HDL", "LDLCALC", "TRIG", "CHOLHDL", "LDLDIRECT" in the last 72 hours. Thyroid Function Tests No results for input(s): "TSH", "T4TOTAL", "T3FREE", "THYROIDAB" in the last 72 hours.  Invalid input(s): "FREET3"  Other results:   Imaging    DG Chest Port 1 View Result Date: 05/06/2023 CLINICAL DATA:  PICC line placement EXAM: PORTABLE CHEST 1 VIEW COMPARISON:  05/03/2023 FINDINGS: Single frontal view of the chest demonstrates interval placement  of a left-sided PICC, tip overlying superior vena cava. The cardiac silhouette remains enlarged. There are increasing bibasilar veiling opacities, left greater than right, consistent with bibasilar consolidation and effusions. No pneumothorax. No acute bony abnormalities. IMPRESSION: 1. Left-sided PICC, tip overlying SVC. 2. Increasing bibasilar veiling opacities, consistent with progressive consolidation and effusions. Electronically Signed   By: Sharlet Salina M.D.   On: 05/06/2023 16:35   [place] EKG SITE RITE Result Date: 05/06/2023 If Site Rite image not attached, placement could not  be confirmed due to current cardiac rhythm.    Medications:     Scheduled Medications:  atorvastatin  40 mg Oral Daily   [MAR Hold] Chlorhexidine Gluconate Cloth  6 each Topical Daily   [MAR Hold] feeding supplement  237 mL Oral BID BM   furosemide  80 mg Intravenous BID   [MAR Hold] guaiFENesin  600 mg Oral BID   [MAR Hold] insulin aspart  0-5 Units Subcutaneous QHS   [MAR Hold] insulin aspart  0-9 Units Subcutaneous TID WC   [MAR Hold] mupirocin ointment   Nasal BID   [MAR Hold] ondansetron (ZOFRAN) IV  4 mg Intravenous Once   [MAR Hold] mouth rinse  15 mL Mouth Rinse 4 times per day   potassium chloride  40 mEq Oral Once   [MAR Hold] sodium chloride flush  10-40 mL Intracatheter Q12H   [MAR Hold] Warfarin - Pharmacist Dosing Inpatient   Does not apply q1600    Infusions:  sodium chloride Stopped (05/06/23 1050)   sodium chloride     [MAR Hold]  ceFAZolin (ANCEF) IV 2 g (05/06/23 2146)    PRN Medications: [MAR Hold] acetaminophen **OR** [MAR Hold] acetaminophen, [MAR Hold] albuterol, [MAR Hold] alum & mag hydroxide-simeth, [MAR Hold] HYDROcodone-acetaminophen, [MAR Hold] LORazepam, [MAR Hold] ondansetron **OR** [MAR Hold] ondansetron (ZOFRAN) IV, [MAR Hold] mouth rinse, [MAR Hold] sodium chloride flush     Assessment/Plan   1. Acute on chronic systolic CHF: With mixed cardiogenic/septic shock initially, now resolved. Has not been on pressors.  Last lactate 1.2.  Patient has history of primarily nonischemic cardiomyopathy.  He has refused ICD.  He has had trouble getting his HF medications.  Echo this admission was unchanged from past with EF 20-25%, global HK, moderate LVH, severe RV dysfunction, mild-moderate MR, moderate-severe TR, IVC dilated.  Exam is difficult for volume given c-spine collar but CVP 15 this morning with adequate co-ox 63% and stable BP.  Creatinine 2.57 => 2.44.  - Hold off on milrinone with adequate co-ox.  - Lasix 80 mg IV bid today.  - Hold off on  other GDMT with elevatesd creatinine.  2. MSSA bacteremia: Source likely from leg wound, also has C4/C5 prevertebral collection concerning for discitis.  ID following.  - Continue cefazolin - I contacted neurosurgery, ok for TEE as long as he stays in C-collar.  Will plan TEE this morning (with DCCV, see below).  I discussed risks/benefits with patient and he agrees to procedure.  3. AKI on CKD stage 3: Baseline creatinine around 1.7.  Creatinine 2.57 => 2.44 today.  Suspect due to septic shock with cardiorenal component.  4. Atrial fibrillation: Persistent.  He was in atrial fibrillation at last outpatient appt but was in NSR prior to that.  - Adding amiodarone 400 mg bid to maintain NSR after DCCV.  - He is on warfarin, INR 3.5.  - TEE-guided DCCV today, discussed risks/benefits with patient and he agreed to procedure.  5. Elevated CPK: Mild rhabdomyolysis due to  fall, peak CK 2978.  6. Chronic LLE wound: ?Source for MSSA bacteremia.  7. Elevated LFTs: Suspect shock liver, trending down.  - Think ok to restart home statin.  8. H/o colon cancer 9. C-spine prevertebral edema: Upper c-spine with edema in the C4-C5 disc, ?infectious. No drainable fluid collection per IR.  - He is in a c-spine collar.  - Neurosurgery has seen. - Cleared for TEE if wearing C-spine    Length of Stay: 4  Marca Ancona, MD  05/07/2023, 7:32 AM  Advanced Heart Failure Team Pager [phone] (M-F; 7a - 5p)  Please contact [facility] for night-coverage after hours (5p -7a ) and weekends on [email or web address]

## 2023-05-07 NOTE — Progress Notes (Addendum)
 Patient ID: Christopher Carras., male   DOB: Aug 20, 1945, 78 y.o.   MRN: [id number]     Advanced Heart Failure Rounding Note  Cardiologist: Lorine Bears, MD  Chief Complaint: CHF Subjective:    Patient remains in atrial fibrillation. Creatinine 2.57 => 2.44.  SBP 120s-150s, co-ox 63%.  INR 3.5. CVP around 15.   Has cough.  Comfortable at rest.  Denies neck pain.    Objective:   Weight Range: 73.1 kg Body mass index is 23.8 kg/m.   Vital Signs:   Temp:  [97.7 F (36.5 C)-99.1 F (37.3 C)] 99 F (37.2 C) (04/01 0719) Pulse Rate:  [51-110] 104 (04/01 0719) Resp:  [16-47] 39 (04/01 0719) BP: (93-160)/(52-109) 128/97 (04/01 0719) SpO2:  [92 %-100 %] 99 % (04/01 0719) Last BM Date : 05/06/23  Weight change: Filed Weights   05/03/23 1622 05/04/23 0406  Weight: 75.4 kg 73.1 kg    Intake/Output:   Intake/Output Summary (Last 24 hours) at 05/07/2023 0732 Last data filed at 05/07/2023 0500 Gross per 24 hour  Intake 100 ml  Output 910 ml  Net -810 ml      Physical Exam    General:  Well appearing. No resp difficulty HEENT: Normal Neck: Supple. JVP 12-14 cm. Carotids 2+ bilat; no bruits. No lymphadenopathy or thyromegaly appreciated. Cor: PMI nondisplaced. Regular rate & rhythm. No rubs, gallops or murmurs. Lungs: Occasional rhonchi Abdomen: Soft, nontender, nondistended. No hepatosplenomegaly. No bruits or masses. Good bowel sounds. Extremities: No cyanosis, clubbing, rash, edema. Left leg/foot wrapped.  Neuro: Alert & orientedx3, cranial nerves grossly intact. moves all 4 extremities w/o difficulty. Affect pleasant   Telemetry   Atrial fibrillation rate 90s-100s (personally reviewed).   EKG    Atrial fibrillation, IVCD (personally reviewed)  Labs    CBC Recent Labs    05/06/23 0424 05/07/23 0448  WBC 8.1 9.3  NEUTROABS 6.5 7.2  HGB 10.9* 12.8*  HCT 32.9* 38.4*  MCV 100.0 99.7  PLT 96* 119*   Basic Metabolic Panel Recent Labs    40/98/11 0424  05/07/23 0448  NA 136 138  K 3.9 3.6  CL 101 104  CO2 24 24  GLUCOSE 209* 162*  BUN 85* 81*  CREATININE 2.57* 2.44*  CALCIUM 7.9* 8.1*   Liver Function Tests Recent Labs    05/06/23 0424 05/07/23 0448  AST 131* 78*  ALT 35 8  ALKPHOS 77 98  BILITOT 1.8* 2.2*  PROT 5.2* 5.9*  ALBUMIN 2.5* 2.6*   No results for input(s): "LIPASE", "AMYLASE" in the last 72 hours. Cardiac Enzymes No results for input(s): "CKTOTAL", "CKMB", "CKMBINDEX", "TROPONINI" in the last 72 hours.  BNP: BNP (last 3 results) Recent Labs    08/21/22 1142 12/13/22 1140 04/16/23 1541  BNP 791.1* 2,083.6* 1,510.6*    ProBNP (last 3 results) No results for input(s): "PROBNP" in the last 8760 hours.   D-Dimer No results for input(s): "DDIMER" in the last 72 hours. Hemoglobin A1C Recent Labs    05/06/23 0424  HGBA1C 6.9*   Fasting Lipid Panel No results for input(s): "CHOL", "HDL", "LDLCALC", "TRIG", "CHOLHDL", "LDLDIRECT" in the last 72 hours. Thyroid Function Tests No results for input(s): "TSH", "T4TOTAL", "T3FREE", "THYROIDAB" in the last 72 hours.  Invalid input(s): "FREET3"  Other results:   Imaging    DG Chest Port 1 View Result Date: 05/06/2023 CLINICAL DATA:  PICC line placement EXAM: PORTABLE CHEST 1 VIEW COMPARISON:  05/03/2023 FINDINGS: Single frontal view of the chest demonstrates interval placement  of a left-sided PICC, tip overlying superior vena cava. The cardiac silhouette remains enlarged. There are increasing bibasilar veiling opacities, left greater than right, consistent with bibasilar consolidation and effusions. No pneumothorax. No acute bony abnormalities. IMPRESSION: 1. Left-sided PICC, tip overlying SVC. 2. Increasing bibasilar veiling opacities, consistent with progressive consolidation and effusions. Electronically Signed   By: Sharlet Salina M.D.   On: 05/06/2023 16:35   [place] EKG SITE RITE Result Date: 05/06/2023 If Site Rite image not attached, placement could not  be confirmed due to current cardiac rhythm.    Medications:     Scheduled Medications:  atorvastatin  40 mg Oral Daily   [MAR Hold] Chlorhexidine Gluconate Cloth  6 each Topical Daily   [MAR Hold] feeding supplement  237 mL Oral BID BM   furosemide  80 mg Intravenous BID   [MAR Hold] guaiFENesin  600 mg Oral BID   [MAR Hold] insulin aspart  0-5 Units Subcutaneous QHS   [MAR Hold] insulin aspart  0-9 Units Subcutaneous TID WC   [MAR Hold] mupirocin ointment   Nasal BID   [MAR Hold] ondansetron (ZOFRAN) IV  4 mg Intravenous Once   [MAR Hold] mouth rinse  15 mL Mouth Rinse 4 times per day   potassium chloride  40 mEq Oral Once   [MAR Hold] sodium chloride flush  10-40 mL Intracatheter Q12H   [MAR Hold] Warfarin - Pharmacist Dosing Inpatient   Does not apply q1600    Infusions:  sodium chloride Stopped (05/06/23 1050)   sodium chloride     [MAR Hold]  ceFAZolin (ANCEF) IV 2 g (05/06/23 2146)    PRN Medications: [MAR Hold] acetaminophen **OR** [MAR Hold] acetaminophen, [MAR Hold] albuterol, [MAR Hold] alum & mag hydroxide-simeth, [MAR Hold] HYDROcodone-acetaminophen, [MAR Hold] LORazepam, [MAR Hold] ondansetron **OR** [MAR Hold] ondansetron (ZOFRAN) IV, [MAR Hold] mouth rinse, [MAR Hold] sodium chloride flush     Assessment/Plan   1. Acute on chronic systolic CHF: With mixed cardiogenic/septic shock initially, now resolved. Has not been on pressors.  Last lactate 1.2.  Patient has history of primarily nonischemic cardiomyopathy.  He has refused ICD.  He has had trouble getting his HF medications.  Echo this admission was unchanged from past with EF 20-25%, global HK, moderate LVH, severe RV dysfunction, mild-moderate MR, moderate-severe TR, IVC dilated.  Exam is difficult for volume given c-spine collar but CVP 15 this morning with adequate co-ox 63% and stable BP.  Creatinine 2.57 => 2.44.  - Hold off on milrinone with adequate co-ox.  - Lasix 80 mg IV bid today.  - Hold off on  other GDMT with elevatesd creatinine.  2. MSSA bacteremia: Source likely from leg wound, also has C4/C5 prevertebral collection concerning for discitis.  ID following.  - Continue cefazolin - I contacted neurosurgery, ok for TEE as long as he stays in C-collar.  Will plan TEE this morning (with DCCV, see below).  I discussed risks/benefits with patient and he agrees to procedure.  3. AKI on CKD stage 3: Baseline creatinine around 1.7.  Creatinine 2.57 => 2.44 today.  Suspect due to septic shock with cardiorenal component.  4. Atrial fibrillation: Persistent.  He was in atrial fibrillation at last outpatient appt but was in NSR prior to that.  - Adding amiodarone IV to maintain NSR after DCCV.  - He is on warfarin, INR 3.5.  - TEE-guided DCCV today, discussed risks/benefits with patient and he agreed to procedure.  5. Elevated CPK: Mild rhabdomyolysis due to fall, peak  CK 2978.  6. Chronic LLE wound: ?Source for MSSA bacteremia.  7. Elevated LFTs: Suspect shock liver, trending down.  - Think ok to restart home statin.  8. H/o colon cancer 9. C-spine prevertebral edema: Upper c-spine with edema in the C4-C5 disc, ?infectious. No drainable fluid collection per IR.  - He is in a c-spine collar.  - Neurosurgery has seen. - Cleared for TEE if wearing C-spine    Length of Stay: 4  Marca Ancona, MD  05/07/2023, 7:32 AM  Advanced Heart Failure Team Pager [phone] (M-F; 7a - 5p)  Please contact [facility] for night-coverage after hours (5p -7a ) and weekends on [email or web address]

## 2023-05-07 NOTE — Plan of Care (Signed)
  Problem: Fluid Volume: Goal: Hemodynamic stability will improve Outcome: Progressing   Problem: Fluid Volume: Goal: Hemodynamic stability will improve Outcome: Progressing   Problem: Clinical Measurements: Goal: Diagnostic test results will improve Outcome: Progressing Goal: Signs and symptoms of infection will decrease Outcome: Progressing   Problem: Clinical Measurements: Goal: Diagnostic test results will improve Outcome: Progressing   Problem: Fluid Volume: Goal: Hemodynamic stability will improve Outcome: Progressing   Problem: Clinical Measurements: Goal: Signs and symptoms of infection will decrease Outcome: Progressing   Problem: Respiratory: Goal: Ability to maintain adequate ventilation will improve Outcome: Progressing   Problem: Respiratory: Goal: Ability to maintain adequate ventilation will improve Outcome: Progressing   Problem: Education: Goal: Knowledge of General Education information will improve Description: Including pain rating scale, medication(s)/side effects and non-pharmacologic comfort measures Outcome: Progressing   Problem: Education: Goal: Knowledge of General Education information will improve Description: Including pain rating scale, medication(s)/side effects and non-pharmacologic comfort measures Outcome: Progressing

## 2023-05-07 NOTE — Interval H&P Note (Signed)
 History and Physical Interval Note:  05/07/2023 7:56 AM  [name]  has presented today for surgery, with the diagnosis of Endocarditis.  The various methods of treatment have been discussed with the patient and family. After consideration of risks, benefits and other options for treatment, the patient has consented to  Procedure(s): ECHOCARDIOGRAM, TRANSESOPHAGEAL (N/A) as a surgical intervention.  The patient's history has been reviewed, patient examined, no change in status, stable for surgery.  I have reviewed the patient's chart and labs.  Questions were answered to the patient's satisfaction.     Sindia Kowalczyk [employer]

## 2023-05-07 NOTE — Progress Notes (Signed)
 Date of Admission:  05/03/2023      ID: Christopher Sark. is a 78 y.o. male  Principal Problem:   Severe sepsis (HCC) Active Problems:   History of colon cancer   Chronic HFrEF (heart failure with reduced ejection fraction) (HCC)   CAD S/P percutaneous coronary angioplasty   Acute renal failure superimposed on stage 3b chronic kidney disease (HCC)   Thrombocytopenia (HCC)   Atrial fibrillation with rapid ventricular response (HCC)   Traumatic rhabdomyolysis (HCC)   Accidental fall from bed, initial encounter   Supratherapeutic INR   Elevated troponin   Abnormal LFTs--possible biliary tract disease   Pneumonia, possible   Syncope, presumed   Acute ST elevation myocardial infarction (STEMI) of inferior wall (HCC)   Abnormal MRI, cervical spine   MSSA bacteremia   Non-traumatic rhabdomyolysis   78 y.o. with a history of HTN, AFIB, CAD, Stage 3 kidney disease, DM, h/o sigmoid ca s/p colectomy presents to the ED after being found on the floor at home on 05/03/23.   Wife at bed side  Subjective: Says he is feeling better Had TEE today Medications:   atorvastatin  40 mg Oral Daily   Chlorhexidine Gluconate Cloth  6 each Topical Daily   feeding supplement  237 mL Oral BID BM   furosemide  80 mg Intravenous BID   guaiFENesin  600 mg Oral BID   insulin aspart  0-5 Units Subcutaneous QHS   insulin aspart  0-9 Units Subcutaneous TID WC   mupirocin ointment   Nasal BID   ondansetron (ZOFRAN) IV  4 mg Intravenous Once   mouth rinse  15 mL Mouth Rinse 4 times per day   sodium chloride flush  10-40 mL Intracatheter Q12H   Warfarin - Pharmacist Dosing Inpatient   Does not apply q1600    Objective: Vital signs in last 24 hours: Patient Vitals for the past 24 hrs:  BP Temp Temp src Pulse Resp SpO2  05/07/23 1300 137/65 -- -- -- (!) 36 --  05/07/23 1200 131/89 -- -- -- (!) 37 --  05/07/23 1130 -- 98.7 F (37.1 C) Oral -- -- --  05/07/23 1100 (!) 144/69 -- -- (!) 50 (!) 37 97 %   05/07/23 1000 135/88 -- -- (!) 106 (!) 34 98 %  05/07/23 0900 (!) 152/80 98.6 F (37 C) Oral (!) 102 (!) 33 96 %  05/07/23 0845 (!) 145/88 -- -- 94 (!) 42 95 %  05/07/23 0834 (!) 130/90 -- -- (!) 105 -- 100 %  05/07/23 0825 (!) 146/82 -- -- -- -- --  05/07/23 0823 135/69 -- -- -- -- --  05/07/23 0821 121/72 -- -- -- -- --  05/07/23 0819 115/73 -- -- -- -- --  05/07/23 0817 117/85 -- -- 95 (!) 34 100 %  05/07/23 0816 -- -- -- 90 (!) 32 100 %  05/07/23 0815 110/75 -- -- 98 (!) 39 100 %  05/07/23 0814 -- -- -- 85 (!) 31 100 %  05/07/23 0813 -- -- -- 93 (!) 25 100 %  05/07/23 0812 99/73 -- -- 86 (!) 32 100 %  05/07/23 0811 -- -- -- (!) 106 (!) 35 100 %  05/07/23 0810 -- -- -- 96 (!) 34 100 %  05/07/23 0809 109/63 -- -- 98 (!) 29 100 %  05/07/23 0808 -- -- -- 96 (!) 31 100 %  05/07/23 0807 -- -- -- (!) 103 (!) 35 100 %  05/07/23 0719 (!) 128/97  99 F (37.2 C) Oral (!) 104 (!) 39 99 %  05/07/23 0700 (!) 151/91 -- -- -- (!) 37 --  05/07/23 0600 (!) 137/107 -- -- -- (!) 34 --  05/07/23 0500 137/78 -- -- 97 (!) 30 92 %  05/07/23 0400 (!) 160/97 99 F (37.2 C) Oral (!) 110 (!) 40 92 %  05/07/23 0300 (!) 158/81 -- -- -- (!) 36 --  05/07/23 0200 (!) 144/75 -- -- 66 (!) 25 99 %  05/07/23 0100 -- -- -- (!) 57 (!) 26 98 %  05/07/23 0000 (!) 152/91 98.8 F (37.1 C) Oral (!) 101 (!) 34 97 %  05/06/23 2300 (!) 146/79 -- -- -- (!) 24 --  05/06/23 2200 (!) 151/96 -- -- 100 (!) 34 96 %  05/06/23 2100 137/79 -- -- 60 (!) 35 96 %  05/06/23 2000 (!) 150/72 -- -- (!) 51 (!) 47 97 %  05/06/23 1900 116/68 99.1 F (37.3 C) Oral 73 18 97 %  05/06/23 1800 135/72 -- -- -- 20 --  05/06/23 1700 114/74 -- -- 78 20 98 %  05/06/23 1600 123/78 99.1 F (37.3 C) Oral 83 20 97 %  05/06/23 1500 -- -- -- 72 16 97 %      PHYSICAL EXAM:  General: Alert, cooperative, no distress,   Lungs: Clear to auscultation bilaterally. No Wheezing or Rhonchi. No rales. Heart: Regular rate and rhythm, no murmur, rub or  gallop. Abdomen: Soft, non-tender,not distended. Bowel sounds normal. No masses Extremities: atraumatic, no cyanosis. No edema. No clubbing Skin: wounds Lymph: Cervical, supraclavicular normal. Neurologic: Grossly non-focal  Lab Results    Latest Ref Rng & Units 05/07/2023    4:48 AM 05/06/2023    4:24 AM 05/05/2023    2:52 AM  CBC  WBC 4.0 - 10.5 K/uL 9.3  8.1  10.4   Hemoglobin 13.0 - 17.0 g/dL 16.1  09.6  04.5   Hematocrit 39.0 - 52.0 % 38.4  32.9  34.9   Platelets 150 - 400 K/uL 119  96  108        Latest Ref Rng & Units 05/07/2023    4:48 AM 05/06/2023    4:24 AM 05/05/2023    2:52 AM  CMP  Glucose 70 - 99 mg/dL 409  811  914   BUN 8 - 23 mg/dL 81  85  78   Creatinine 0.61 - 1.24 mg/dL 7.82  9.56  2.13   Sodium 135 - 145 mmol/L 138  136  138   Potassium 3.5 - 5.1 mmol/L 3.6  3.9  4.0   Chloride 98 - 111 mmol/L 104  101  103   CO2 22 - 32 mmol/L 24  24  24    Calcium 8.9 - 10.3 mg/dL 8.1  7.9  7.9   Total Protein 6.5 - 8.1 g/dL 5.9  5.2    Total Bilirubin 0.0 - 1.2 mg/dL 2.2  1.8    Alkaline Phos 38 - 126 U/L 98  77    AST 15 - 41 U/L 78  131    ALT 0 - 44 U/L 8  35        Microbiology: 05/03/23 4/4 MSSA 3/30 BC - NG Studies/Results: ECHO TEE Result Date: 05/07/2023    TRANSESOPHOGEAL ECHO REPORT   Patient Name:   Christopher Villamizar. Date of Exam: 05/07/2023 Medical Rec #:  [id number]         Height:       69.0  in Accession #:    [id number]        Weight:       161.2 lb Date of Birth:  [birth year]        BSA:          1.885 m Patient Age:    77 years          BP:           137/78 mmHg Patient Gender: M                 HR:           97 bpm. Exam Location:  [facility] Procedure: Transesophageal Echo, Cardiac Doppler and Color Doppler (Both            Spectral and Color Flow Doppler were utilized during procedure). Indications:     Bacteremia R78.81  History:         Patient has prior history of Echocardiogram examinations, most                  recent 05/05/2023. CAD; Risk Factors:Diabetes  and Hypertension.                  Persistent Afib.  Sonographer:     Cristela Blue Referring Phys:  [id number] [facility] Diagnosing Phys: Wilfred Lacy PROCEDURE: The transesophogeal probe was passed without difficulty through the esophogus of the patient. Sedation performed by different physician. The patient developed no complications during the procedure.  IMPRESSIONS  1. Left ventricular ejection fraction, by estimation, is 20 to 25%. The left ventricle has severely decreased function. The left ventricle demonstrates global hypokinesis. The left ventricular internal cavity size was mildly dilated. There is mild concentric left ventricular hypertrophy.  2. Peak RV-RA gradient 41 mmHg. Right ventricular systolic function is severely reduced. The right ventricular size is mildly enlarged.  3. Left atrial size was moderately dilated. No left atrial/left atrial appendage thrombus was detected.  4. Right atrial size was moderately dilated.  5. A small pericardial effusion is present. The pericardial effusion is circumferential.  6. No mitral valve vegetation. The mitral valve is normal in structure. Mild to moderate mitral valve regurgitation. No evidence of mitral stenosis.  7. No tricuspid valve vegetation.  8. No aortic valve vegetation. The aortic valve is tricuspid. Aortic valve regurgitation is not visualized. No aortic stenosis is present.  9. No pulmonic valve vegetation. 10. No PFO or ASD by color doppler. 11. No evidence for endocarditis FINDINGS  Left Ventricle: Left ventricular ejection fraction, by estimation, is 20 to 25%. The left ventricle has severely decreased function. The left ventricle demonstrates global hypokinesis. The left ventricular internal cavity size was mildly dilated. There is mild concentric left ventricular hypertrophy. Right Ventricle: Peak RV-RA gradient 41 mmHg. The right ventricular size is mildly enlarged. No increase in right ventricular wall thickness. Right ventricular  systolic function is severely reduced. Left Atrium: Left atrial size was moderately dilated. No left atrial/left atrial appendage thrombus was detected. Right Atrium: Right atrial size was moderately dilated. Pericardium: A small pericardial effusion is present. The pericardial effusion is circumferential. Mitral Valve: No mitral valve vegetation. The mitral valve is normal in structure. Mild to moderate mitral valve regurgitation. No evidence of mitral valve stenosis. Tricuspid Valve: No tricuspid valve vegetation. The tricuspid valve is normal in structure. Tricuspid valve regurgitation is mild. Aortic Valve: No aortic valve vegetation. The aortic valve is tricuspid. Aortic valve regurgitation is not visualized. No aortic stenosis is  present. Pulmonic Valve: No pulmonic valve vegetation. The pulmonic valve was normal in structure. Pulmonic valve regurgitation is not visualized. Aorta: The aortic root is normal in size and structure. IAS/Shunts: No PFO or ASD by color doppler. [name] Electronically signed by Wilfred Lacy Signature Date/Time: 05/07/2023/1:14:36 PM    Final    DG Chest Port 1 View Result Date: 05/06/2023 CLINICAL DATA:  PICC line placement EXAM: PORTABLE CHEST 1 VIEW COMPARISON:  05/03/2023 FINDINGS: Single frontal view of the chest demonstrates interval placement of a left-sided PICC, tip overlying superior vena cava. The cardiac silhouette remains enlarged. There are increasing bibasilar veiling opacities, left greater than right, consistent with bibasilar consolidation and effusions. No pneumothorax. No acute bony abnormalities. IMPRESSION: 1. Left-sided PICC, tip overlying SVC. 2. Increasing bibasilar veiling opacities, consistent with progressive consolidation and effusions. Electronically Signed   By: Sharlet Salina M.D.   On: 05/06/2023 16:35   [place] EKG SITE RITE Result Date: 05/06/2023 If Site Rite image not attached, placement could not be confirmed due to current cardiac  rhythm.    Assessment/Plan: Staph aureus bacteremia- source likely foot wound Culture sent from the foot wound yesterday Pt is on cefazolin He has a deep focus of infection in the cervical spine IR does not think there is any fluid collection to aspirate TEE no endocarditis Will need long term antibiotic- 6 weeks of IV cefazolin Will discuss with neurosurgery regarding any plan for intervention   Degenerative disc disease of cervical spine With some thinning VS disruption of the longitudinal ligamnet- has a cervical collar   Fall at home Was not aware how he fell or why?   AKI on CKD Abnormal LFTs improving    Rhabdomyolysis due to Fall and on the floor  CK improving  CHF- EF 20-25%   Anemia   Afib - pt on coumadin  Chronic venous changes legs with healed wounds except one   DM on insulin     H/o sigmoid ca- s/p surgery H/o chemo     Discussed the management with patient and his wife?

## 2023-05-07 NOTE — Plan of Care (Signed)
  Problem: Respiratory: Goal: Ability to maintain adequate ventilation will improve Outcome: Progressing   Problem: Elimination: Goal: Will not experience complications related to urinary retention Outcome: Progressing   Problem: Pain Managment: Goal: General experience of comfort will improve and/or be controlled Outcome: Progressing   Problem: Safety: Goal: Ability to remain free from injury will improve Outcome: Progressing   Problem: Skin Integrity: Goal: Risk for impaired skin integrity will decrease Outcome: Progressing   Problem: Fluid Volume: Goal: Ability to maintain a balanced intake and output will improve Outcome: Progressing   Problem: Skin Integrity: Goal: Risk for impaired skin integrity will decrease Outcome: Progressing   Problem: Tissue Perfusion: Goal: Adequacy of tissue perfusion will improve Outcome: Progressing

## 2023-05-07 NOTE — CV Procedure (Signed)
 Procedure: TEE  Sedation: Per anesthesiology  Indication: Endocarditis and cardioversion  Findings: Please see echo section for full report.  Mildly dilated LV with mild LV hypertrophy.  EF 20-25%, global hypokinesis. Mildly dilated RV with severe systolic dysfunction.  Moderate biatrial enlargement.  No LA appendage thrombus though smoke was seen.  No PFO/ASD by color doppler.  Trivial TR, no TV vegetation.  Trivial PI, no PV vegetation.  Mild-moderate MR, no MV vegetation.  Trileaflet aortic valve, no stenosis or regurgitation.  No AoV vegetation.  Normal caliber thoracic aorta with mild plaque.   Impression: No LAA thrombus, proceed to DCCV No evidence for endocarditis.   [name] 05/07/2023 8:26 AM

## 2023-05-07 NOTE — Progress Notes (Addendum)
 MD at bedside. Notified of pt's RR in 30s-40s. Spo2 100% No new orders at this time.

## 2023-05-07 NOTE — Progress Notes (Signed)
 Progress Note   Patient: [name]. ZOX:[id number] DOB: [birth year] DOA: 05/03/2023     4 DOS: the patient was seen and examined on 05/07/2023     Brief hospital course: From HPI: [name]. is a 78 y.o. male with medical history significant for CAD s/p stenting, chronic HFrEF secondary to NICM (EF 20 to 25% 1/17), persistent A-fib on warfarin due to cost of Eliquis, HTN, HLD, CKD 3B, and colon cancer 2015, who is being admitted with multiple acute derangements, related to a fall off his bed with no recollection of the circumstances.  Per wife at bedside, she left patient in bed this morning and when she returned home at 10 AM patient was lying on the floor on his stomach with his head turned to the side.  She states he was awake and alert.  He was unable to get up.  At baseline he does not walk with a cane or walker and typically he would have been able to get up on his own. ED course and data review: Tachycardic to 121 with soft blood pressure of 105/75. Respiratory viral panel negative for COVID flu and RSV CT abdomen and pelvis with no acute abnormalities Chest x-ray showing left lower lobe consolidation likely atelectasis, pneumonia not ruled out CT C-spine nontraumatic but concerning for prevertebral fluid with recommendations for follow-up MRI to evaluate for osteomyelitis discitis. TRH showing therefore contacted for admission "       Assessment and Plan:  Severe sepsis (HCC) Severe sepsis versus cardiogenic Possible pneumonia Possible discitis MSSA bacteremia MRI showed large volume prevertebral edema in the advanced cervical spine around C4-C5 Severe sepsis criteria include tachycardia, leukocytosis with lactic acidosis, AKI and hyperbilirubinemia Blood culture growing MSSA Continue cefazolin as agreed by infectious disease Follow-up on culture results Infectious disease on board and case discussed Continue current antibiotic therapy Patient underwent TEE on  05/07/2023 that did not show any evidence of endocarditis Patient also underwent DCCV today with normal sinus rhythm with frequent PACs   Pneumonia, possible Continue current antibiotics Wean off oxygen as tolerated     Abnormal MRI, cervical spine Paravertebral C-spine fluid collection/edema --possible osteomyelitis/discitis Chronic baseline neck pain Patient was seen by currently there is no drainable fluid in the cervical area  We will continue to monitor  Plan of care discussed with neurosurgeon Neurosurgery recommends to keep cervical collar in place     Syncope, presumed Echo did not show any findings of vegetation and EF is unchanged from prior echo Continue telemetry monitoring   Abnormal LFTs--possible biliary tract disease Elevated lipase Abnormal LFTs suspect related to hepatic congestion from low output cardiac failure versus severe sepsis Patient has been seen by gastroenterologist with no new recommendations Abdominal ultrasound showing cholelithiasis with no evidence of acute cholecystitis Continue to monitor LFTs   CAD S/P percutaneous coronary angioplasty Elevated troponin Troponin 340 but suspecting demand ischemia Cardiologist on board Continue statin therapy   Atrial fibrillation with rapid ventricular response (HCC) Cardiologist on board and case discussed Pharmacy on board with anticoagulant management in the setting of supratherapeutic INR on presentation Continue amiodarone   Acute renal failure superimposed on stage 3b chronic kidney disease (HCC) Second to severe sepsis versus low output and low flow state Monitor for improvement with above treatment Continue to monitor input and  output  Renal function peaked and now showing some improvement If renal function were to worsen we will consider nephrology consultation   Traumatic rhabdomyolysis (HCC) Accidental fall  from bed-presumed syncope Continue PT OT     Chronic HFrEF (heart failure with  reduced ejection fraction) (HCC) NICM, EF 20 to 25%-declined AICD in the past Edema lower extremities but otherwise appears to be breathing easily Holding blood pressure lowering GDMT due to soft BPs and in the setting of sepsis   Supratherapeutic INR Patient received vitamin K     Thrombocytopenia (HCC) Monitor platelet closely   History of colon cancer Neuropathy related to chemotherapy - No acute disuse     DVT prophylaxis: SCD   Consults: Neurosurgery, dimensional radiology, cardiology   Advance Care Planning:   Code Status: Full Code    Family Communication: wife at bedside   Disposition Plan: Back to previous home environment   Subjective:  Patient seen and examined at bedside this morning Patient underwent TEE today Denied worsening neck pain nausea vomiting or abdominal pain     Physical Exam:   Constitutional:      General: He is not in acute distress.    Interventions: Cervical collar in place.  HENT:     Head: Normocephalic and atraumatic.  Cardiovascular:     Rate and Rhythm: Irregular    Heart sounds: No murmur heard.    No systolic murmur is present.     No diastolic murmur is present.  Pulmonary:     Effort: Pulmonary effort is normal.     Breath sounds: Normal breath sounds.  Abdominal:     Palpations: Abdomen is soft.     Tenderness: There is no abdominal tenderness.  Musculoskeletal: Lateral lower extremity edema Neurological: Limited range of motion involving bilateral shoulders more pronounced on the right.  Able to move bilateral lower extremity with some generalized weakness    General: No focal deficit present.     Mental Status: Mental status is at baseline   Data Reviewed:   Vitals:   05/07/23 1100 05/07/23 1130 05/07/23 1200 05/07/23 1300  BP: (!) 144/69  131/89 137/65  Pulse: (!) 50     Resp: (!) 37  (!) 37 (!) 36  Temp:  98.7 F (37.1 C)    TempSrc:  Oral    SpO2: 97%     Weight:      Height:          Latest Ref Rng &  Units 05/07/2023    4:48 AM 05/06/2023    4:24 AM 05/05/2023    2:52 AM  CBC  WBC 4.0 - 10.5 K/uL 9.3  8.1  10.4   Hemoglobin 13.0 - 17.0 g/dL 24.4  01.0  27.2   Hematocrit 39.0 - 52.0 % 38.4  32.9  34.9   Platelets 150 - 400 K/uL 119  96  108        Latest Ref Rng & Units 05/07/2023    4:48 AM 05/06/2023    4:24 AM 05/05/2023    2:52 AM  BMP  Glucose 70 - 99 mg/dL 536  644  034   BUN 8 - 23 mg/dL 81  85  78   Creatinine 0.61 - 1.24 mg/dL 7.42  5.95  6.38   Sodium 135 - 145 mmol/L 138  136  138   Potassium 3.5 - 5.1 mmol/L 3.6  3.9  4.0   Chloride 98 - 111 mmol/L 104  101  103   CO2 22 - 32 mmol/L 24  24  24    Calcium 8.9 - 10.3 mg/dL 8.1  7.9  7.9      Author: Criss Alvine  Gus Puma, MD 05/07/2023 2:14 PM  For on call review [email or web address].

## 2023-05-07 NOTE — Anesthesia Preprocedure Evaluation (Signed)
 Anesthesia Evaluation  Patient identified by MRN, date of birth, ID band Patient awake    Reviewed: Allergy & Precautions, H&P , NPO status , Patient's Chart, lab work & pertinent test results, reviewed documented beta blocker date and time   Airway Mallampati: II   Neck ROM: full    Dental  (+) Poor Dentition   Pulmonary pneumonia, resolved   Pulmonary exam normal        Cardiovascular Exercise Tolerance: Poor hypertension, On Medications + CAD, + Past MI, + Peripheral Vascular Disease and +CHF  (-) Orthopnea Normal cardiovascular exam Rhythm:regular Rate:Normal     Neuro/Psych  Neuromuscular disease  negative psych ROS   GI/Hepatic negative GI ROS, Neg liver ROS,,,  Endo/Other  negative endocrine ROSdiabetes    Renal/GU Renal disease  negative genitourinary   Musculoskeletal   Abdominal   Peds  Hematology  (+) Blood dyscrasia, anemia   Anesthesia Other Findings Past Medical History: 06/19/2013: Adenocarcinoma of colon (HCC)     Comment:  a. moderately differentiated stage IIIc (T4bN2a M0)               adenocarcinoma of colon  s/p colectomy followed by               chemoRx with oxaliplatin & fluorouacil. No date: Anemia No date: CAD (coronary artery disease)     Comment:  a. 02/2015 Cath: LM nl, LAD 50/40 mid/distal, LCX 80p,               RCA 40p, 30d, RPL1 40, CO 3.27, CI 1.65; b. 06/09/15 PCI:               LM minor irregs, m-dLAD 50%, dLAD 40%, pLCx 80% (s/p               PCI/DES 0%), pRCA 40%, dRCA 30%, 1st RPLB 40% No date: Chronic kidney disease, stage 3a (HCC) No date: Chronic systolic CHF (congestive heart failure) (HCC)     Comment:  a. 02/2015 Echo: EF 20-25%, diff HK, mod MR, mildy dil               LA, mildly dil RV w/ mod RV syst dysfxn, mildly dil RA,               mod TR, PASP ; b. 09/2017 Echo: EF 40-45%, diff HK.               Gr1 DD. Mild to mod MR. Mildly to mod dil LA; c. 09/2019                Echo: EF 25-30%, glob HK, mod reduced RV fxn, sev elev               PASP. Mod dil LA. Mild-mod MR; d. 05/2021 Echo: EF 20-25%,              glob HK, sev red RV fxn, sev BAE, sev MR/TR/TS, triv AI. No date: Diabetes mellitus without complication (HCC) No date: Essential hypertension No date: Hypertensive heart disease 05/2020: Left inguinal hernia No date: Mixed Ischemic and Non-ischemic Cardiomyopathy     Comment:  a. 02/2015 EF 20-25%, diff HK; b. 09/2016 EF 40-45%; c.               09/2019 EF 25-30%; d. 05/2021 Echo: EF 20-25%, glob HK. No date: Persistent atrial fibrillation (HCC)     Comment:  a. Dx 05/2021-->CHA2DS2VASc = 5-->eliquis. No date: Pulmonary hypertension (HCC) No date: Severe  mitral regurgitation     Comment:  a. 09/2017 Echo: Mild to mod MR; b. 09/2019 Echo: mild-mod              MR; c. 05/2021 Echo: Sev MR/TR/TS. No date: Tubular adenoma of colon Past Surgical History: 06/19/2017: APPENDECTOMY; N/A     Comment:  Location: [facility]; Surgeon: Claude Manges, MD 02/24/2015: CARDIAC CATHETERIZATION; Bilateral     Comment:  Procedure: Right/Left Heart Cath and Coronary               Angiography;  Surgeon: Iran Ouch, MD;  Location:               [facility] IN[facility]IVE CV [facility];  Service: Cardiovascular;                Laterality: Bilateral; 06/09/2015: CARDIAC CATHETERIZATION; N/A     Comment:  Procedure: Coronary Stent Intervention (3.0 x 12 mm               Xience Alpine DES to LCx);  Surgeon: Iran Ouch,               MD;  Location: [facility] INV[facility] CV [facility];  Service:               Cardiovascular;  Laterality: N/A 06/19/2017: COLECTOMY     Comment:  Descending and proximal sigmoid colectomy, LEFT               ureterolysis, partial cecectomy, appendectomy; Location:               [facility]; Surgeon: Claude Manges, MD No date: COLONOSCOPY 12/11/2016: COLONOSCOPY WITH PROPOFOL; N/A     Comment:  Procedure: COLONOSCOPY WITH PROPOFOL;  Surgeon:                Christena Deem, MD;  Location: [facility] ENDOSCOPY;                Service: Endoscopy;  Laterality: N/A; No date: ESOPHAGOGASTRODUODENOSCOPY No date: HERNIA REPAIR 07/12/2014: PORT-A-CATH REMOVAL; N/A     Comment:  Procedure: REMOVAL PORT-A-CATH;  Surgeon: Duwaine Maxin, MD;  Location: [facility] ORS;  Service: General;                Laterality: N/A; 2015: PORTACATH PLACEMENT; Left BMI    Body Mass Index: 23.80 kg/m     Reproductive/Obstetrics negative OB ROS                             Anesthesia Physical Anesthesia Plan  ASA: 4  Anesthesia Plan: General   Post-op Pain Management:    Induction:   PONV Risk Score and Plan:   Airway Management Planned:   Additional Equipment:   Intra-op Plan:   Post-operative Plan:   Informed Consent: I have reviewed the patients History and Physical, chart, labs and discussed the procedure including the risks, benefits and alternatives for the proposed anesthesia with the patient or authorized representative who has indicated his/her understanding and acceptance.     Dental Advisory Given  Plan Discussed with: CRNA  Anesthesia Plan Comments:        Anesthesia Quick Evaluation

## 2023-05-07 NOTE — Procedures (Signed)
 Electrical Cardioversion Procedure Note Forney Kleinpeter [id number] [birth year]  Procedure: Electrical Cardioversion Indications:  Atrial Fibrillation  Procedure Details Consent: Risks of procedure as well as the alternatives and risks of each were explained to the (patient/caregiver).  Consent for procedure obtained. Time Out: Verified patient identification, verified procedure, site/side was marked, verified correct patient position, special equipment/implants available, medications/allergies/relevent history reviewed, required imaging and test results available.  Performed  Patient placed on cardiac monitor, pulse oximetry, supplemental oxygen as necessary.  Sedation given:  Propofol per anesthesiology Pacer pads placed anterior and posterior chest.  Cardioverted 2 times with sternal pressure.  Cardioverted at 360J.  Evaluation Findings: Post procedure EKG shows: NSR with frequent PACs Complications: None Patient did tolerate procedure well.   Marca Ancona 05/07/2023, 8:26 AM

## 2023-05-07 NOTE — Plan of Care (Signed)
 The patient remains on the [facility]-ICU/SDU at time of writing. C-collar in place. No supplemental O2 requirement at this time. PICC to LUE. IV Amio per orders.   Problem: Fluid Volume: Goal: Hemodynamic stability will improve Outcome: Progressing   Problem: Clinical Measurements: Goal: Diagnostic test results will improve Outcome: Progressing Goal: Signs and symptoms of infection will decrease Outcome: Progressing   Problem: Respiratory: Goal: Ability to maintain adequate ventilation will improve Outcome: Progressing   Problem: Education: Goal: Knowledge of General Education information will improve Description: Including pain rating scale, medication(s)/side effects and non-pharmacologic comfort measures Outcome: Progressing   Problem: Health Behavior/Discharge Planning: Goal: Ability to manage health-related needs will improve Outcome: Progressing   Problem: Clinical Measurements: Goal: Ability to maintain clinical measurements within normal limits will improve Outcome: Progressing Goal: Will remain free from infection Outcome: Progressing Goal: Diagnostic test results will improve Outcome: Progressing Goal: Respiratory complications will improve Outcome: Progressing Goal: Cardiovascular complication will be avoided Outcome: Progressing   Problem: Activity: Goal: Risk for activity intolerance will decrease Outcome: Progressing   Problem: Nutrition: Goal: Adequate nutrition will be maintained Outcome: Progressing   Problem: Coping: Goal: Level of anxiety will decrease Outcome: Progressing   Problem: Elimination: Goal: Will not experience complications related to bowel motility Outcome: Progressing Goal: Will not experience complications related to urinary retention Outcome: Progressing   Problem: Pain Managment: Goal: General experience of comfort will improve and/or be controlled Outcome: Progressing   Problem: Safety: Goal: Ability to remain free from injury  will improve Outcome: Progressing   Problem: Skin Integrity: Goal: Risk for impaired skin integrity will decrease Outcome: Progressing   Problem: Education: Goal: Ability to describe self-care measures that may prevent or decrease complications (Diabetes Survival Skills Education) will improve Outcome: Progressing Goal: Individualized Educational Video(s) Outcome: Progressing   Problem: Coping: Goal: Ability to adjust to condition or change in health will improve Outcome: Progressing   Problem: Fluid Volume: Goal: Ability to maintain a balanced intake and output will improve Outcome: Progressing   Problem: Health Behavior/Discharge Planning: Goal: Ability to identify and utilize available resources and services will improve Outcome: Progressing Goal: Ability to manage health-related needs will improve Outcome: Progressing   Problem: Metabolic: Goal: Ability to maintain appropriate glucose levels will improve Outcome: Progressing   Problem: Nutritional: Goal: Maintenance of adequate nutrition will improve Outcome: Progressing Goal: Progress toward achieving an optimal weight will improve Outcome: Progressing   Problem: Skin Integrity: Goal: Risk for impaired skin integrity will decrease Outcome: Progressing   Problem: Tissue Perfusion: Goal: Adequacy of tissue perfusion will improve Outcome: Progressing

## 2023-05-07 NOTE — Anesthesia Postprocedure Evaluation (Signed)
 Anesthesia Post Note  Patient: [name].  Procedure(s) Performed: ECHOCARDIOGRAM, TRANSESOPHAGEAL  Patient location during evaluation: PACU Anesthesia Type: General Level of consciousness: awake and alert Pain management: pain level controlled Vital Signs Assessment: post-procedure vital signs reviewed and stable Respiratory status: spontaneous breathing, nonlabored ventilation, respiratory function stable and patient connected to nasal cannula oxygen Cardiovascular status: blood pressure returned to baseline and stable Postop Assessment: no apparent nausea or vomiting Anesthetic complications: no   No notable events documented.   Last Vitals:  Vitals:   05/07/23 0825 05/07/23 0834  BP: (!) 146/82 (!) 130/90  Pulse:  (!) 105  Resp:    Temp:    SpO2:  100%    Last Pain:  Vitals:   05/07/23 0834  TempSrc:   PainSc: 0-No pain                 Yevette Edwards

## 2023-05-07 DEATH — deceased

## 2023-05-08 ENCOUNTER — Encounter: Payer: Self-pay | Admitting: Cardiology

## 2023-05-08 ENCOUNTER — Other Ambulatory Visit: Payer: Self-pay

## 2023-05-08 DIAGNOSIS — A419 Sepsis, unspecified organism: Secondary | ICD-10-CM | POA: Diagnosis not present

## 2023-05-08 DIAGNOSIS — I5023 Acute on chronic systolic (congestive) heart failure: Secondary | ICD-10-CM | POA: Diagnosis not present

## 2023-05-08 DIAGNOSIS — B9561 Methicillin susceptible Staphylococcus aureus infection as the cause of diseases classified elsewhere: Secondary | ICD-10-CM | POA: Diagnosis not present

## 2023-05-08 DIAGNOSIS — R6520 Severe sepsis without septic shock: Secondary | ICD-10-CM | POA: Diagnosis not present

## 2023-05-08 DIAGNOSIS — M5090 Cervical disc disorder, unspecified, unspecified cervical region: Secondary | ICD-10-CM | POA: Diagnosis not present

## 2023-05-08 DIAGNOSIS — R937 Abnormal findings on diagnostic imaging of other parts of musculoskeletal system: Secondary | ICD-10-CM | POA: Diagnosis not present

## 2023-05-08 DIAGNOSIS — R7881 Bacteremia: Secondary | ICD-10-CM | POA: Diagnosis not present

## 2023-05-08 LAB — CBC WITH DIFFERENTIAL/PLATELET
Abs Immature Granulocytes: 0.05 10*3/uL (ref 0.00–0.07)
Basophils Absolute: 0 10*3/uL (ref 0.0–0.1)
Basophils Relative: 0 %
Eosinophils Absolute: 0 10*3/uL (ref 0.0–0.5)
Eosinophils Relative: 1 %
HCT: 39.8 % (ref 39.0–52.0)
Hemoglobin: 13.1 g/dL (ref 13.0–17.0)
Immature Granulocytes: 1 %
Lymphocytes Relative: 9 %
Lymphs Abs: 0.8 10*3/uL (ref 0.7–4.0)
MCH: 33 pg (ref 26.0–34.0)
MCHC: 32.9 g/dL (ref 30.0–36.0)
MCV: 100.3 fL — ABNORMAL HIGH (ref 80.0–100.0)
Monocytes Absolute: 1 10*3/uL (ref 0.1–1.0)
Monocytes Relative: 12 %
Neutro Abs: 6.5 10*3/uL (ref 1.7–7.7)
Neutrophils Relative %: 77 %
Platelets: 121 10*3/uL — ABNORMAL LOW (ref 150–400)
RBC: 3.97 MIL/uL — ABNORMAL LOW (ref 4.22–5.81)
RDW: 17.4 % — ABNORMAL HIGH (ref 11.5–15.5)
Smear Review: NORMAL
WBC: 8.4 10*3/uL (ref 4.0–10.5)
nRBC: 0 % (ref 0.0–0.2)

## 2023-05-08 LAB — COMPREHENSIVE METABOLIC PANEL WITH GFR
ALT: 6 U/L (ref 0–44)
AST: 44 U/L — ABNORMAL HIGH (ref 15–41)
Albumin: 2.1 g/dL — ABNORMAL LOW (ref 3.5–5.0)
Alkaline Phosphatase: 96 U/L (ref 38–126)
Anion gap: 10 (ref 5–15)
BUN: 77 mg/dL — ABNORMAL HIGH (ref 8–23)
CO2: 26 mmol/L (ref 22–32)
Calcium: 8 mg/dL — ABNORMAL LOW (ref 8.9–10.3)
Chloride: 101 mmol/L (ref 98–111)
Creatinine, Ser: 2.44 mg/dL — ABNORMAL HIGH (ref 0.61–1.24)
GFR, Estimated: 27 mL/min — ABNORMAL LOW (ref >60)
Glucose, Bld: 216 mg/dL — ABNORMAL HIGH (ref 70–99)
Potassium: 3.6 mmol/L (ref 3.5–5.1)
Sodium: 137 mmol/L (ref 135–145)
Total Bilirubin: 1.6 mg/dL — ABNORMAL HIGH (ref 0.0–1.2)
Total Protein: 5.5 g/dL — ABNORMAL LOW (ref 6.5–8.1)

## 2023-05-08 LAB — GLUCOSE, CAPILLARY
Glucose-Capillary: 205 mg/dL — ABNORMAL HIGH (ref 70–99)
Glucose-Capillary: 246 mg/dL — ABNORMAL HIGH (ref 70–99)
Glucose-Capillary: 164 mg/dL — ABNORMAL HIGH (ref 70–99)
Glucose-Capillary: 155 mg/dL — ABNORMAL HIGH (ref 70–99)

## 2023-05-08 LAB — PROTIME-INR
INR: 4.5 (ref 0.8–1.2)
Prothrombin Time: 42.9 s — ABNORMAL HIGH (ref 11.4–15.2)

## 2023-05-08 LAB — COOXEMETRY PANEL: Total oxygen content: 60.4 *Deleted

## 2023-05-08 LAB — C-REACTIVE PROTEIN: CRP: 9.2 mg/dL — ABNORMAL HIGH (ref <1.0)

## 2023-05-08 LAB — CK: Total CK: 160 U/L (ref 49–397)

## 2023-05-08 LAB — SEDIMENTATION RATE: Sed Rate: 17 mm/h (ref 0–20)

## 2023-05-08 MED ORDER — DAPAGLIFLOZIN PROPANEDIOL 10 MG PO TABS
10.0000 mg | ORAL_TABLET | Freq: Every day | ORAL | Status: DC
  Administered 2023-05-08 – 2023-05-14 (×7): 10 mg via ORAL
  Filled 2023-05-08 (×7): qty 1

## 2023-05-08 MED ORDER — FUROSEMIDE 10 MG/ML IJ SOLN
80.0000 mg | Freq: Two times a day (BID) | INTRAMUSCULAR | Status: AC
  Administered 2023-05-08 (×2): 80 mg via INTRAVENOUS
  Filled 2023-05-08 (×2): qty 8

## 2023-05-08 MED ORDER — HYDROCERIN EX CREA
TOPICAL_CREAM | Freq: Two times a day (BID) | CUTANEOUS | Status: DC
  Administered 2023-05-08 – 2023-05-14 (×3): 1 via TOPICAL
  Filled 2023-05-08 (×2): qty 113

## 2023-05-08 MED ORDER — POTASSIUM CHLORIDE CRYS ER 20 MEQ PO TBCR
40.0000 meq | EXTENDED_RELEASE_TABLET | Freq: Once | ORAL | Status: AC
  Administered 2023-05-08: 40 meq via ORAL
  Filled 2023-05-08: qty 2

## 2023-05-08 NOTE — Consult Note (Signed)
 PHARMACY - ANTICOAGULATION CONSULT NOTE  Pharmacy Consult for Warfarin Indication: atrial fibrillation  Allergies  Allergen Reactions   Shellfish Allergy Other (See Comments) and Hives    Pt. instructed by MD to avoid seafood    Patient Measurements: Height: 5\' 9"  (175.3 cm) Weight: 75.3 kg (166 lb 0.1 oz) IBW/kg (Calculated) : 70.7 HEPARIN DW (KG): 73.1  Vital Signs: Temp: 97.3 F (36.3 C) (04/02 0400) Temp Source: Axillary (04/02 0400) BP: 105/65 (04/02 0700) Pulse Rate: 48 (04/02 0700)  Labs: Recent Labs    05/06/23 0424 05/07/23 0448 05/08/23 0421  HGB 10.9* 12.8* 13.1  HCT 32.9* 38.4* 39.8  PLT 96* 119* 121*  LABPROT 28.2* 35.0* 42.9*  INR 2.6* 3.5* 4.5*  CREATININE 2.57* 2.44* 2.44*    Estimated Creatinine Clearance: 25.8 mL/min (A) (by C-G formula based on SCr of 2.44 mg/dL (H)).   Medical History: Past Medical History:  Diagnosis Date   Adenocarcinoma of colon (HCC) 06/19/2013   a. moderately differentiated stage IIIc (T4bN2a M0) adenocarcinoma of colon  s/p colectomy followed by chemoRx with oxaliplatin & fluorouacil.   Anemia    CAD (coronary artery disease)    a. 02/2015 Cath: LM nl, LAD 50/40 mid/distal, LCX 80p, RCA 40p, 30d, RPL1 40, CO 3.27, CI 1.65; b. 06/09/15 PCI: LM minor irregs, m-dLAD 50%, dLAD 40%, pLCx 80% (s/p PCI/DES 0%), pRCA 40%, dRCA 30%, 1st RPLB 40%   Chronic kidney disease, stage 3a (HCC)    Chronic systolic CHF (congestive heart failure) (HCC)    a. 02/2015 Echo: EF 20-25%, diff HK, mod MR, mildy dil LA, mildly dil RV w/ mod RV syst dysfxn, mildly dil RA, mod TR, PASP ; b. 09/2017 Echo: EF 40-45%, diff HK. Gr1 DD. Mild to mod MR. Mildly to mod dil LA; c. 09/2019 Echo: EF 25-30%, glob HK, mod reduced RV fxn, sev elev PASP. Mod dil LA. Mild-mod MR; d. 05/2021 Echo: EF 20-25%, glob HK, sev red RV fxn, sev BAE, sev MR/TR/TS, triv AI.   Diabetes mellitus without complication (HCC)    Essential hypertension    Hypertensive heart disease     Left inguinal hernia 05/2020   Mixed Ischemic and Non-ischemic Cardiomyopathy    a. 02/2015 EF 20-25%, diff HK; b. 09/2016 EF 40-45%; c. 09/2019 EF 25-30%; d. 05/2021 Echo: EF 20-25%, glob HK.   Persistent atrial fibrillation (HCC)    a. Dx 05/2021-->CHA2DS2VASc = 5-->eliquis.   Pulmonary hypertension (HCC)    Severe mitral regurgitation    a. 09/2017 Echo: Mild to mod MR; b. 09/2019 Echo: mild-mod MR; c. 05/2021 Echo: Sev MR/TR/TS.   Tubular adenoma of colon     Medications:  Per note from Anti-Coag visit on 04/24/23 patient taking Warfarin 2.5 mg po daily.  Assessment: 78 yo male presenting to ED by EMS due to being found in floor by wife.  PMH includes Afib on Warfarin, CAD, DM, HTN, CHF, and CKD.  Patient treated for sepsis likely due to pneumonia.  Pharmacy consulted to manage Warfarin.  Goal of Therapy:  INR 2-3 Monitor platelets by anticoagulation protocol: Yes    Date INR Warfarin Dose  3/28 5.6 Hold  3/29 7.2 Hold + vitamin IV 10 mg x 1  3/30 2.4 2.5 mg  3/31 2.6 2.5 mg  4/01 3.5 HOLD  4/02 4.5 HOLD     Plan:  ---INR is supratherapeutic.  ---Will hold warfarin again today ---Daily INR. CBC at least every 3 days.   [name], PharmD 05/08/2023,7:13 AM

## 2023-05-08 NOTE — Progress Notes (Addendum)
 Patient ID: Linzie Boursiquot., male   DOB: [birth year], 78 y.o.   MRN: [id number]     Advanced Heart Failure Rounding Note  Cardiologist: Lorine Bears, MD  Chief Complaint: CHF Subjective:   4/1 : TEE - no endocarditis. DC-CV - conversion to SR.   Diuresed with IV lasix. Negative 1.4 liters.   Had a hard time sleeping last night. Denies SOB.   Objective:   Weight Range: 75.3 kg Body mass index is 24.51 kg/m.   Vital Signs:   Temp:  [97.3 F (36.3 C)-98.7 F (37.1 C)] 98.1 F (36.7 C) (04/02 0749) Pulse Rate:  [35-106] 48 (04/02 0700) Resp:  [22-44] 22 (04/02 0700) BP: (99-153)/(48-112) 105/65 (04/02 0700) SpO2:  [95 %-100 %] 97 % (04/02 0700) Weight:  [75.3 kg] 75.3 kg (04/02 0400) Last BM Date : 05/06/23  Weight change: Filed Weights   05/03/23 1622 05/04/23 0406 05/08/23 0400  Weight: 75.4 kg 73.1 kg 75.3 kg    Intake/Output:   Intake/Output Summary (Last 24 hours) at 05/08/2023 0750 Last data filed at 05/08/2023 0600 Gross per 24 hour  Intake 1035.22 ml  Output 2500 ml  Net -1464.78 ml     CVP 7-8  Physical Exam    General:   No resp difficulty Neck:Cervical Collar   Cor: PMI nondisplaced. Regular rate & rhythm. No rubs, gallops or murmurs. Lungs: clear Abdomen: soft, nontender, nondistended.  Extremities: no cyanosis, clubbing, rash, edema. LLE dressing Neuro: alert & oriented x3   Telemetry  SR with occasional PVCs   EKG    Atrial fibrillation, IVCD (personally reviewed)  Labs    CBC Recent Labs    05/07/23 0448 05/08/23 0421  WBC 9.3 8.4  NEUTROABS 7.2 6.5  HGB 12.8* 13.1  HCT 38.4* 39.8  MCV 99.7 100.3*  PLT 119* 121*   Basic Metabolic Panel Recent Labs    14/78/29 0448 05/08/23 0421  NA 138 137  K 3.6 3.6  CL 104 101  CO2 24 26  GLUCOSE 162* 216*  BUN 81* 77*  CREATININE 2.44* 2.44*  CALCIUM 8.1* 8.0*   Liver Function Tests Recent Labs    05/07/23 0448 05/08/23 0421  AST 78* 44*  ALT 8 6  ALKPHOS 98 96   BILITOT 2.2* 1.6*  PROT 5.9* 5.5*  ALBUMIN 2.6* 2.1*   No results for input(s): "LIPASE", "AMYLASE" in the last 72 hours. Cardiac Enzymes No results for input(s): "CKTOTAL", "CKMB", "CKMBINDEX", "TROPONINI" in the last 72 hours.  BNP: BNP (last 3 results) Recent Labs    08/21/22 1142 12/13/22 1140 04/16/23 1541  BNP 791.1* 2,083.6* 1,510.6*    ProBNP (last 3 results) No results for input(s): "PROBNP" in the last 8760 hours.   D-Dimer No results for input(s): "DDIMER" in the last 72 hours. Hemoglobin A1C Recent Labs    05/06/23 0424  HGBA1C 6.9*   Fasting Lipid Panel No results for input(s): "CHOL", "HDL", "LDLCALC", "TRIG", "CHOLHDL", "LDLDIRECT" in the last 72 hours. Thyroid Function Tests No results for input(s): "TSH", "T4TOTAL", "T3FREE", "THYROIDAB" in the last 72 hours.  Invalid input(s): "FREET3"  Other results:   Imaging    ECHO TEE Result Date: 05/07/2023    TRANSESOPHOGEAL ECHO REPORT   Patient Name:   Joffre Lucks. Date of Exam: 05/07/2023 Medical Rec #:  [id number]         Height:       69.0 in Accession #:    [id number]  Weight:       161.2 lb Date of Birth:  Feb 09, 1945        BSA:          1.885 m Patient Age:    75 years          BP:           137/78 mmHg Patient Gender: M                 HR:           97 bpm. Exam Location:  [facility] Procedure: Transesophageal Echo, Cardiac Doppler and Color Doppler (Both            Spectral and Color Flow Doppler were utilized during procedure). Indications:     Bacteremia R78.81  History:         Patient has prior history of Echocardiogram examinations, most                  recent 05/05/2023. CAD; Risk Factors:Diabetes and Hypertension.                  Persistent Afib.  Sonographer:     Cristela Blue Referring Phys:  [id number] [facility] Diagnosing Phys: Wilfred Lacy PROCEDURE: The transesophogeal probe was passed without difficulty through the esophogus of the patient. Sedation performed by different physician. The  patient developed no complications during the procedure.  IMPRESSIONS  1. Left ventricular ejection fraction, by estimation, is 20 to 25%. The left ventricle has severely decreased function. The left ventricle demonstrates global hypokinesis. The left ventricular internal cavity size was mildly dilated. There is mild concentric left ventricular hypertrophy.  2. Peak RV-RA gradient 41 mmHg. Right ventricular systolic function is severely reduced. The right ventricular size is mildly enlarged.  3. Left atrial size was moderately dilated. No left atrial/left atrial appendage thrombus was detected.  4. Right atrial size was moderately dilated.  5. A small pericardial effusion is present. The pericardial effusion is circumferential.  6. No mitral valve vegetation. The mitral valve is normal in structure. Mild to moderate mitral valve regurgitation. No evidence of mitral stenosis.  7. No tricuspid valve vegetation.  8. No aortic valve vegetation. The aortic valve is tricuspid. Aortic valve regurgitation is not visualized. No aortic stenosis is present.  9. No pulmonic valve vegetation. 10. No PFO or ASD by color doppler. 11. No evidence for endocarditis FINDINGS  Left Ventricle: Left ventricular ejection fraction, by estimation, is 20 to 25%. The left ventricle has severely decreased function. The left ventricle demonstrates global hypokinesis. The left ventricular internal cavity size was mildly dilated. There is mild concentric left ventricular hypertrophy. Right Ventricle: Peak RV-RA gradient 41 mmHg. The right ventricular size is mildly enlarged. No increase in right ventricular wall thickness. Right ventricular systolic function is severely reduced. Left Atrium: Left atrial size was moderately dilated. No left atrial/left atrial appendage thrombus was detected. Right Atrium: Right atrial size was moderately dilated. Pericardium: A small pericardial effusion is present. The pericardial effusion is circumferential.  Mitral Valve: No mitral valve vegetation. The mitral valve is normal in structure. Mild to moderate mitral valve regurgitation. No evidence of mitral valve stenosis. Tricuspid Valve: No tricuspid valve vegetation. The tricuspid valve is normal in structure. Tricuspid valve regurgitation is mild. Aortic Valve: No aortic valve vegetation. The aortic valve is tricuspid. Aortic valve regurgitation is not visualized. No aortic stenosis is present. Pulmonic Valve: No pulmonic valve vegetation. The pulmonic valve was normal in structure.  Pulmonic valve regurgitation is not visualized. Aorta: The aortic root is normal in size and structure. IAS/Shunts: No PFO or ASD by color doppler. Hazyl Marseille [employer] Electronically signed by Wilfred Lacy Signature Date/Time: 05/07/2023/1:14:36 PM    Final      Medications:     Scheduled Medications:  atorvastatin  40 mg Oral Daily   Chlorhexidine Gluconate Cloth  6 each Topical Daily   dapagliflozin propanediol  10 mg Oral Daily   feeding supplement  237 mL Oral BID BM   guaiFENesin  600 mg Oral BID   insulin aspart  0-5 Units Subcutaneous QHS   insulin aspart  0-9 Units Subcutaneous TID WC   mupirocin ointment   Nasal BID   ondansetron (ZOFRAN) IV  4 mg Intravenous Once   mouth rinse  15 mL Mouth Rinse 4 times per day   pneumococcal 20-valent conjugate vaccine  0.5 mL Intramuscular Tomorrow-1000   potassium chloride  40 mEq Oral Once   sodium chloride flush  10-40 mL Intracatheter Q12H   Warfarin - Pharmacist Dosing Inpatient   Does not apply q1600    Infusions:  sodium chloride Stopped (05/08/23 0500)   amiodarone 30 mg/hr (05/08/23 0600)    ceFAZolin (ANCEF) IV Stopped (05/07/23 2158)    PRN Medications: sodium chloride, acetaminophen **OR** acetaminophen, albuterol, alum & mag hydroxide-simeth, HYDROcodone-acetaminophen, LORazepam, ondansetron **OR** ondansetron (ZOFRAN) IV, mouth rinse, sodium chloride flush     Assessment/Plan   1. Acute on  chronic systolic CHF: With mixed cardiogenic/septic shock initially, now resolved. Has not been on pressors.  Last lactate 1.2.  Patient has history of primarily nonischemic cardiomyopathy.  He has refused ICD.  He has had trouble getting his HF medications.  Echo this admission was unchanged from past with EF 20-25%, global HK, moderate LVH, severe RV dysfunction, mild-moderate MR, moderate-severe TR, IVC dilated.  Exam is difficult for volume given c-spine collar .  - CO-OX remains stable. No indications for inotropes.  - CVP down to 7-8 after IV lasix.  Give two more dose of IV lasix then can switch to oral diuretics.   - Add farxiga 10 mg daily  - No room for additional GDMT   2. MSSA bacteremia: Source likely from leg wound, also has C4/C5 prevertebral collection concerning for discitis.  ID following.  - Continue cefazolin -TEE no evidence of endocarditis.  3. AKI on CKD stage 3: Baseline creatinine around 1.7.  Creatinine 2.57 => today 2.4.  Suspect due to septic shock with cardiorenal component.  4. Atrial fibrillation: Persistent.  He was in atrial fibrillation at last outpatient appt but was in NSR prior to that.  S/P DC-CV with conversion to SR.  - Continue IV amio today. Consider transition to po amio 24-48 hours.  - He is on warfarin, INR 4.5 .  5. Elevated CPK: Mild rhabdomyolysis due to fall, peak CK 2978.  6. Chronic LLE wound: ?Source for MSSA bacteremia.  7. Elevated LFTs: Suspect shock liver, trending down.  - Continue statin. LFTs continue to trend down.  8. H/o colon cancer 9. C-spine prevertebral edema: Upper c-spine with edema in the C4-C5 disc, ?infectious. No drainable fluid collection per IR.  - He is in a c-spine collar.  - Neurosurgery has seen.    Length of Stay: 5  Amy Clegg, NP  05/08/2023, 7:50 AM  Advanced Heart Failure Team Pager [phone] (M-F; 7a - 5p)  Please contact [facility] for night-coverage after hours (5p -7a ) and weekends on  [email or web address]  Patient seen with NP, I formulated the plan and agree with the above note.   TEE yesterday showed EF 20-25%, global hypokinesis, mild RV dilation with severe dysfnction, mild-moderate MR, mild TR.  No endocarditis.  He was cardioverted back to NSR and remains in NSR today on amiodarone gtt.   Co-ox 62%, creatinine stable at 2.44.  CVP 9-10 on my read.   General: NAD Neck: C-collar in place.  Lungs: Clear to auscultation bilaterally with normal respiratory effort. CV: Nondisplaced PMI.  Heart regular S1/S2, no S3/S4, no murmur.  No peripheral edema.   Abdomen: Soft, nontender, no hepatosplenomegaly, no distention.  Skin: Intact without lesions or rashes.  Neurologic: Alert and oriented x 3.  Psych: Normal affect. Extremities: No clubbing or cyanosis.  HEENT: Normal.   Mild residual volume overload, will give 1 more day Lasix 80 mg IV bid.  Add Farxiga 10 mg daily.  Other GDMT limited by elevated creatinine but will try to slowly add.   Patient is in NSR after DCCV.  Would continue IV amiodarone for another day then to po. He is on warfarin, INR is elevated.    No evidence for endocarditis, source of MSSA bacteremia is likely leg wound.  Will need cefazolin x 6 wks.    Marca Ancona 05/08/2023 10:20 AM

## 2023-05-08 NOTE — Inpatient Diabetes Management (Signed)
 Inpatient Diabetes Program Recommendations  AACE/ADA: New Consensus Statement on Inpatient Glycemic Control (2015)  Target Ranges:  Prepandial:   less than 140 mg/dL      Peak postprandial:   less than 180 mg/dL (1-2 hours)      Critically ill patients:  140 - 180 mg/dL    Latest Reference Range & Units 05/07/23 07:48 05/07/23 09:44 05/07/23 11:46 05/07/23 16:17 05/07/23 21:38  Glucose-Capillary 70 - 99 mg/dL 161 (H) 096 (H)  2 units Novolog  174 (H) 166 (H)  2 units Novolog  160 (H)  (H): Data is abnormally high  Latest Reference Range & Units 05/08/23 08:02 05/08/23 11:28  Glucose-Capillary 70 - 99 mg/dL 045 (H)  3 units Novolog  246 (H)  3 units Novolog   (H): Data is abnormally high    Home DM Meds: Metformin 500 mg BID (NOT taking)       Farxiga 10 mg daily  Current Orders: Farxiga 10 mg daily     Novolog Sensitive Correction Scale/ SSI (0-9 units) TID AC + HS     MD- Note Farxiga added back today  CBGs >200  If this trend continues, may consider adding Novolog meal coverage: Novolog 3 units TID with meals HOLD if pt NPO HOLD if pt eats <50% meals    --Will follow patient during hospitalization--  Ambrose Finland RN, MSN, CDCES Diabetes Coordinator Inpatient Glycemic Control Team Team Pager: [phone] (8a-5p)

## 2023-05-08 NOTE — Progress Notes (Signed)
 Progress Note   Date: 05/08/2023  HPI:[name] is here for evaluation after being found down. He reported feeling weak for days before this. In the emergency department, he was started on treatment for possible sepsis given his vital signs and elevated white count. Given the fall, he had imaging obtained including the CT and MRI of the cervical spine. There was concern for possible bleeding versus infection.   Interval: Patient seen today, remains in collar. Blood cultures showing staph. He is not endorsing any new focal weakness and has chronic numbness   Vital Signs: Temp:  [97.3 F (36.3 C)-98.7 F (37.1 C)] 98.7 F (37.1 C) (04/02 1535) Pulse Rate:  [35-105] 68 (04/02 1535) Resp:  [19-44] 21 (04/02 1535) BP: (103-153)/(48-118) 131/79 (04/02 1500) SpO2:  [94 %-100 %] 94 % (04/02 1535) Weight:  [75.3 kg] 75.3 kg (04/02 0400) Temp (24hrs), Avg:98.1 F (36.7 C), Min:97.3 F (36.3 C), Max:98.7 F (37.1 C)  Weight: 75.3 kg  Problem List Patient Active Problem List   Diagnosis Date Noted   Spinal epidural abscess 05/07/2023   MSSA bacteremia 05/06/2023   Non-traumatic rhabdomyolysis 05/06/2023   Severe sepsis (HCC) 05/03/2023   Traumatic rhabdomyolysis (HCC) 05/03/2023   Accidental fall from bed, initial encounter 05/03/2023   Supratherapeutic INR 05/03/2023   Elevated troponin 05/03/2023   Abnormal LFTs--possible biliary tract disease 05/03/2023   Pneumonia, possible 05/03/2023   Syncope, presumed 05/03/2023   Acute ST elevation myocardial infarction (STEMI) of inferior wall (HCC) 05/03/2023   Abnormal MRI, cervical spine 05/03/2023   Endocarditis 05/03/2023   Right shoulder pain 10/23/2022   Hyperkalemia 09/18/2022   Allergic rhinitis 05/25/2022   Atherosclerosis of native arteries of extremity with intermittent claudication (HCC) 03/21/2022   Long term (current) use of anticoagulants 10/25/2021   Rash in adult 10/15/2021   Atrial fibrillation with rapid  ventricular response (HCC) 05/29/2021   Hydrocele, left 11/01/2020   Status post laparoscopic hernia repair 09/27/2020   Anemia in chronic kidney disease 04/08/2019   Stage 3a chronic kidney disease (HCC) 04/08/2019   Hyperlipidemia 04/28/2018   Blistering of skin 01/23/2018   Leg wound, left, initial encounter 01/02/2018   Absent pedal pulses 01/02/2018   Onychomycosis 01/02/2018   Leukopenia 09/28/2017   Thrombocytopenia (HCC) 09/28/2017   B12 deficiency 09/28/2017   Renal mass 09/10/2017   Diabetes (HCC) 09/10/2017   Leg wound, right, initial encounter 09/10/2017   Acute renal failure superimposed on stage 3b chronic kidney disease (HCC) 08/14/2017   Anemia 09/12/2015   CAD S/P percutaneous coronary angioplasty    Hypertensive heart disease    CAD (coronary artery disease)    Mixed Ischemic and Non-ischemic Cardiomyopathy    Chronic HFrEF (heart failure with reduced ejection fraction) (HCC)    Bilateral lower extremity edema 02/10/2015   Abdominal pain 02/10/2015   Cough 02/10/2015   Neuropathy (HCC) 09/16/2014   Tubular adenoma of colon 07/20/2014   History of colon cancer 07/20/2014   Benign colon polyp 07/20/2014   Hypertension 07/20/2014   Hyperplastic colon polyp 07/20/2014    Medications: Scheduled Meds:  atorvastatin  40 mg Oral Daily   Chlorhexidine Gluconate Cloth  6 each Topical Daily   dapagliflozin propanediol  10 mg Oral Daily   feeding supplement  237 mL Oral BID BM   furosemide  80 mg Intravenous BID   guaiFENesin  600 mg Oral BID   hydrocerin   Topical BID   insulin aspart  0-5 Units Subcutaneous QHS   insulin aspart  0-9 Units Subcutaneous TID WC   mupirocin ointment   Nasal BID   ondansetron (ZOFRAN) IV  4 mg Intravenous Once   mouth rinse  15 mL Mouth Rinse 4 times per day   pneumococcal 20-valent conjugate vaccine  0.5 mL Intramuscular Tomorrow-1000   sodium chloride flush  10-40 mL Intracatheter Q12H   Warfarin - Pharmacist Dosing Inpatient    Does not apply q1600   Continuous Infusions:  sodium chloride Stopped (05/08/23 0500)   amiodarone 30 mg/hr (05/08/23 1510)    ceFAZolin (ANCEF) IV Stopped (05/08/23 1101)   PRN Meds:.sodium chloride, acetaminophen **OR** acetaminophen, albuterol, alum & mag hydroxide-simeth, HYDROcodone-acetaminophen, LORazepam, ondansetron **OR** ondansetron (ZOFRAN) IV, mouth rinse, sodium chloride flush  Labs:  Lab Results  Component Value Date   WBC 8.4 05/08/2023   WBC 9.3 05/07/2023   HCT 39.8 05/08/2023   HCT 38.4 (L) 05/07/2023   HCT 38.8 05/30/2015   HCT 34.6 (L) 05/27/2014   HCT 35.6 (L) 05/18/2014   PLT 121 (L) 05/08/2023   PLT 119 (L) 05/07/2023   PLT 151 05/30/2015   PLT 155 05/27/2014   PLT 149 (L) 05/18/2014    Lab Results  Component Value Date   LABPT 14.2 (H) 09/16/2014   INR 4.5 (HH) 05/08/2023   APTT 41 (H) 05/03/2023    Lab Results  Component Value Date   NA 137 05/08/2023   NA 138 05/07/2023   NA 143 04/16/2023   NA 144 12/13/2022   NA 140 05/27/2014   NA 143 05/18/2014   K 3.6 05/08/2023   K 3.6 05/07/2023   K 3.9 05/27/2014   K 3.9 05/18/2014   BUN 77 (H) 05/08/2023   BUN 81 (H) 05/07/2023   BUN 27 04/16/2023   BUN 21 12/13/2022   BUN 16 05/27/2014   BUN 16 05/18/2014    Lab Results  Component Value Date   MG 1.7 12/12/2021   MG 1.8 01/06/2014    Exam:  4+/5 in IO, 5/5 in biceps and triceps 4+/5 in BLE but limited by treatment of sores Decreased sensation in distal arms (chronic)  A/P - Continue cervical collar - Will continue to monitor exam, if develops any acute weakness, would consider decompression. Outside of this, little utility to surgery at this time and recommend treating infection  - plan follow up as outpatient    Lucy Chris, MD

## 2023-05-08 NOTE — Progress Notes (Signed)
 Progress Note   Patient: [name]. GNF:[id number] DOB: Feb 14, 1945 DOA: 05/03/2023     5 DOS: the patient was seen and examined on 05/08/2023     Brief hospital course: From HPI: [name]. is a 78 y.o. male with medical history significant for CAD s/p stenting, chronic HFrEF secondary to NICM (EF 20 to 25% 1/17), persistent A-fib on warfarin due to cost of Eliquis, HTN, HLD, CKD 3B, and colon cancer 2015, who is being admitted with multiple acute derangements, related to a fall off his bed with no recollection of the circumstances.  Per wife at bedside, she left patient in bed this morning and when she returned home at 10 AM patient was lying on the floor on his stomach with his head turned to the side.  She states he was awake and alert.  He was unable to get up.  At baseline he does not walk with a cane or walker and typically he would have been able to get up on his own. ED course and data review: Tachycardic to 121 with soft blood pressure of 105/75. Respiratory viral panel negative for COVID flu and RSV CT abdomen and pelvis with no acute abnormalities Chest x-ray showing left lower lobe consolidation likely atelectasis, pneumonia not ruled out CT C-spine nontraumatic but concerning for prevertebral fluid with recommendations for follow-up MRI to evaluate for osteomyelitis discitis. TRH showing therefore contacted for admission "       Assessment and Plan:  Severe sepsis (HCC) Severe sepsis versus cardiogenic Possible pneumonia Possible discitis MSSA bacteremia MRI showed large volume prevertebral edema in the advanced cervical spine around C4-C5 Severe sepsis criteria include tachycardia, leukocytosis with lactic acidosis, AKI and hyperbilirubinemia Blood culture growing MSSA Continue cefazolin as agreed by infectious disease Follow-up on culture results Infectious disease on board and case discussed Continue current antibiotic therapy Patient underwent TEE on  05/07/2023 that did not show any evidence of endocarditis.  Patient had DCCV after TEE after TEE   Pneumonia, possible Continue current antibiotics Wean off oxygen as tolerated     Abnormal MRI, cervical spine Paravertebral C-spine fluid collection/edema --possible osteomyelitis/discitis Chronic baseline neck pain Patient was seen by currently there is no drainable fluid in the cervical area  We will continue to monitor  Plan of care discussed with neurosurgeon Neurosurgery recommends to keep cervical collar in place     Syncope, presumed Echo did not show any findings of vegetation and EF is unchanged from prior echo Continue telemetry monitoring   Abnormal LFTs--possible biliary tract disease Elevated lipase Abnormal LFTs suspect related to hepatic congestion from low output cardiac failure versus severe sepsis Patient has been seen by gastroenterologist with no new recommendations Abdominal ultrasound showing cholelithiasis with no evidence of acute cholecystitis Monitor LFTs   CAD S/P percutaneous coronary angioplasty Elevated troponin Troponin 340 but suspecting demand ischemia Cardiologist on board Continue statin therapy   Atrial fibrillation with rapid ventricular response (HCC) Cardiologist on board and case discussed Pharmacy on board with anticoagulant management in the setting of supratherapeutic INR on presentation Continue amiodarone   Acute renal failure superimposed on stage 3b chronic kidney disease (HCC) Second to severe sepsis versus low output and low flow state Monitor for improvement with above treatment Continue to monitor input and  output  Renal function peaked and now showing some improvement If renal function were to worsen we will consider nephrology consultation   Traumatic rhabdomyolysis (HCC) Accidental fall from bed-presumed syncope Continue PT OT  Chronic HFrEF (heart failure with reduced ejection fraction) (HCC) NICM, EF 20 to  25%-declined AICD in the past Edema lower extremities but otherwise appears to be breathing easily Holding blood pressure lowering GDMT due to soft BPs and in the setting of sepsis   Supratherapeutic INR Patient received vitamin K     Thrombocytopenia (HCC) Monitor platelet closely   History of colon cancer Neuropathy related to chemotherapy - No acute disuse     DVT prophylaxis: SCD   Consults: Neurosurgery, dimensional radiology, cardiology   Advance Care Planning:   Code Status: Full Code    Family Communication: wife at bedside   Disposition Plan: Back to previous home environment   Subjective:  Patient seen and examined at bedside this morning Patient still in cervical collar Denies worsening respiratory function chest pain cough nausea vomiting   Physical Exam:   Constitutional:      General: He is not in acute distress.    Interventions: Cervical collar in place.  HENT:     Head: Normocephalic and atraumatic.  Cardiovascular:     Rate and Rhythm: Irregular    Heart sounds: No murmur heard.    No systolic murmur is present.     No diastolic murmur is present.  Pulmonary:     Effort: Pulmonary effort is normal.     Breath sounds: Normal breath sounds.  Abdominal:     Palpations: Abdomen is soft.     Tenderness: There is no abdominal tenderness.  Musculoskeletal: Lateral lower extremity edema Neurological: Limited range of motion involving bilateral shoulders more pronounced on the right.  Able to move bilateral lower extremity with some generalized weakness    General: No focal deficit present.     Mental Status: Mental status is at baseline   Data Reviewed:    Vitals:   05/08/23 1000 05/08/23 1100 05/08/23 1200 05/08/23 1300  BP: 134/66 (!) 146/89 (!) 135/118 (!) 121/102  Pulse:  (!) 59 79 65  Resp: (!) 28 (!) 28 (!) 29 (!) 28  Temp:   98.4 F (36.9 C)   TempSrc:      SpO2:  96% 100% 100%  Weight:      Height:          Latest Ref Rng & Units  05/08/2023    4:21 AM 05/07/2023    4:48 AM 05/06/2023    4:24 AM  CBC  WBC 4.0 - 10.5 K/uL 8.4  9.3  8.1   Hemoglobin 13.0 - 17.0 g/dL 16.1  09.6  04.5   Hematocrit 39.0 - 52.0 % 39.8  38.4  32.9   Platelets 150 - 400 K/uL 121  119  96        Latest Ref Rng & Units 05/08/2023    4:21 AM 05/07/2023    4:48 AM 05/06/2023    4:24 AM  BMP  Glucose 70 - 99 mg/dL 409  811  914   BUN 8 - 23 mg/dL 77  81  85   Creatinine 0.61 - 1.24 mg/dL 7.82  9.56  2.13   Sodium 135 - 145 mmol/L 137  138  136   Potassium 3.5 - 5.1 mmol/L 3.6  3.6  3.9   Chloride 98 - 111 mmol/L 101  104  101   CO2 22 - 32 mmol/L 26  24  24    Calcium 8.9 - 10.3 mg/dL 8.0  8.1  7.9      Author: Loyce Dys, MD 05/08/2023 2:49 PM  For on call review [email or web address].

## 2023-05-08 NOTE — Progress Notes (Signed)
 MD notified pt's RR continues to be elevated in 30s. No new orders at this time.

## 2023-05-08 NOTE — Evaluation (Signed)
 Physical Therapy Evaluation Patient Details Name: [name]. MRN: [id number] DOB: 1945-10-20 Today's Date: 05/08/2023  History of Present Illness  Patient is a 78 year old male found down at home. Found to have severe sepsis, acute on chronic systolic CHF, MSSA bacteremia, atrial fibrillation s/p cardioversion, C-spine prevertebral edema  Clinical Impression  Patient is agreeable to PT session with encouragement. He reports he lives with his spouse and son in a home with steps to enter. He also reports he is slow with walking but independent at baseline with mobility without assistive device.  Today the patient has C-collar in place throughout session. He complains of pain in the neck with all mobility. He declined sitting up on edge of bed today and requires significant assistance for repositioning in bed. Bed placed in chair position to facilitate upright conditioning and in preparation for eating. Recommend to continue PT to maximize independence and facilitate return to prior level of function. Anticipate patient will need rehabilitation < 3 hours/day after this hospital stay.       If plan is discharge home, recommend the following: Two people to help with walking and/or transfers;A lot of help with bathing/dressing/bathroom;Assist for transportation;Help with stairs or ramp for entrance;Assistance with cooking/housework   Can travel by private vehicle   No    Equipment Recommendations None recommended by PT  Recommendations for Other Services  OT consult    Functional Status Assessment Patient has had a recent decline in their functional status and demonstrates the ability to make significant improvements in function in a reasonable and predictable amount of time.     Precautions / Restrictions Precautions Precautions: Fall;Cervical Precaution Booklet Issued: No Recall of Precautions/Restrictions: Impaired Required Braces or Orthoses: Cervical Brace Restrictions Weight  Bearing Restrictions Per Provider Order: No      Mobility  Bed Mobility Overal bed mobility: Needs Assistance Bed Mobility: Rolling Rolling: Total assist         General bed mobility comments: +2 person assistance for boosting up in the bed and repositioning. patient declined sitting upright today. bed placed in chair position in preparation for eating breakfast. patient complains of pain with any movement or elevation of head of bed. pain decreases with no motion    Transfers                        Ambulation/Gait                  Stairs            Wheelchair Mobility     Tilt Bed    Modified Rankin (Stroke Patients Only)       Balance                                             Pertinent Vitals/Pain Pain Assessment Pain Assessment: Faces Faces Pain Scale: Hurts even more Pain Location: neck Pain Descriptors / Indicators: Discomfort Pain Intervention(s): Monitored during session, Limited activity within patient's tolerance, Repositioned    Home Living Family/patient expects to be discharged to:: Private residence Living Arrangements: Spouse/significant other;Children Available Help at Discharge: Family Type of Home: House Home Access: Stairs to enter Entrance Stairs-Rails: Doctor, general practice of Steps: 2     Home Equipment: Agricultural consultant (2 wheels);Cane - single point      Prior Function Prior Level  of Function : Independent/Modified Independent             Mobility Comments: patient reports he is slow but walks around the house without assistance       Extremity/Trunk Assessment   Upper Extremity Assessment Upper Extremity Assessment: Generalized weakness;Right hand dominant (edema noted bilaterally R>L. generalized weakness throughout)    Lower Extremity Assessment Lower Extremity Assessment: Generalized weakness (chronic wounds with LLE wrapped distally)    Cervical / Trunk  Assessment Cervical / Trunk Assessment:  (C-collar)  Communication   Communication Communication: Impaired Factors Affecting Communication: Difficulty expressing self    Cognition Arousal: Alert Behavior During Therapy: WFL for tasks assessed/performed   PT - Cognitive impairments: No family/caregiver present to determine baseline                         Following commands: Impaired Following commands impaired: Follows one step commands with increased time     Cueing Cueing Techniques: Verbal cues     General Comments      Exercises     Assessment/Plan    PT Assessment Patient needs continued PT services  PT Problem List Decreased strength;Decreased range of motion;Decreased activity tolerance;Decreased balance;Decreased mobility;Decreased safety awareness;Pain       PT Treatment Interventions DME instruction;Gait training;Functional mobility training;Stair training;Therapeutic activities;Therapeutic exercise;Balance training;Neuromuscular re-education;Cognitive remediation;Wheelchair mobility training;Patient/family education    PT Goals (Current goals can be found in the Care Plan section)  Acute Rehab PT Goals Patient Stated Goal: to go home PT Goal Formulation: With patient Time For Goal Achievement: 05/22/23 Potential to Achieve Goals: Fair    Frequency Min 2X/week     Co-evaluation               AM-PAC PT "6 Clicks" Mobility  Outcome Measure Help needed turning from your back to your side while in a flat bed without using bedrails?: Total Help needed moving from lying on your back to sitting on the side of a flat bed without using bedrails?: Total Help needed moving to and from a bed to a chair (including a wheelchair)?: Total Help needed standing up from a chair using your arms (e.g., wheelchair or bedside chair)?: Total Help needed to walk in hospital room?: Total Help needed climbing 3-5 steps with a railing? : Total 6 Click Score:  6    End of Session   Activity Tolerance: Patient limited by fatigue Patient left: in bed;with call bell/phone within reach;with bed alarm set Nurse Communication: Mobility status PT Visit Diagnosis: Unsteadiness on feet (R26.81);Muscle weakness (generalized) (M62.81)    Time: 4098-1191 PT Time Calculation (min) (ACUTE ONLY): 15 min   Charges:   PT Evaluation $PT Eval High Complexity: 1 High   PT General Charges $$ ACUTE PT VISIT: 1 Visit         Donna Bernard, PT, MPT   [name] 05/08/2023, 10:25 AM

## 2023-05-08 NOTE — Progress Notes (Signed)
 Date of Admission:  05/03/2023      ID: [name]. is a 78 y.o. male  Principal Problem:   Severe sepsis (HCC) Active Problems:   History of colon cancer   Chronic HFrEF (heart failure with reduced ejection fraction) (HCC)   CAD S/P percutaneous coronary angioplasty   Acute renal failure superimposed on stage 3b chronic kidney disease (HCC)   Thrombocytopenia (HCC)   Atrial fibrillation with rapid ventricular response (HCC)   Traumatic rhabdomyolysis (HCC)   Accidental fall from bed, initial encounter   Supratherapeutic INR   Elevated troponin   Abnormal LFTs--possible biliary tract disease   Pneumonia, possible   Syncope, presumed   Acute ST elevation myocardial infarction (STEMI) of inferior wall (HCC)   Abnormal MRI, cervical spine   MSSA bacteremia   Non-traumatic rhabdomyolysis   Spinal epidural abscess   78 y.o. with a history of HTN, AFIB, CAD, Stage 3 kidney disease, DM, h/o sigmoid ca s/p colectomy presents to the ED after being found on the floor at home on 05/03/23.     Subjective: Doing fine  Medications:   atorvastatin  40 mg Oral Daily   Chlorhexidine Gluconate Cloth  6 each Topical Daily   dapagliflozin propanediol  10 mg Oral Daily   feeding supplement  237 mL Oral BID BM   furosemide  80 mg Intravenous BID   guaiFENesin  600 mg Oral BID   hydrocerin   Topical BID   insulin aspart  0-5 Units Subcutaneous QHS   insulin aspart  0-9 Units Subcutaneous TID WC   mupirocin ointment   Nasal BID   ondansetron (ZOFRAN) IV  4 mg Intravenous Once   mouth rinse  15 mL Mouth Rinse 4 times per day   pneumococcal 20-valent conjugate vaccine  0.5 mL Intramuscular Tomorrow-1000   sodium chloride flush  10-40 mL Intracatheter Q12H   Warfarin - Pharmacist Dosing Inpatient   Does not apply q1600    Objective: Vital signs in last 24 hours: Patient Vitals for the past 24 hrs:  BP Temp Temp src Pulse Resp SpO2 Weight  05/08/23 1000 134/66 -- -- -- (!) 28 -- --   05/08/23 0900 122/79 -- -- 90 (!) 30 98 % --  05/08/23 0800 119/73 -- -- (!) 38 (!) 28 98 % --  05/08/23 0749 -- 98.1 F (36.7 C) Oral -- -- -- --  05/08/23 0700 105/65 -- -- (!) 48 (!) 22 97 % --  05/08/23 0600 123/60 -- -- (!) 101 (!) 31 99 % --  05/08/23 0522 -- -- -- 69 (!) 30 99 % --  05/08/23 0500 121/84 -- -- 75 (!) 36 98 % --  05/08/23 0400 123/82 (!) 97.3 F (36.3 C) Axillary -- (!) 39 99 % 75.3 kg  05/08/23 0300 128/82 -- -- -- (!) 25 99 % --  05/08/23 0200 (!) 128/59 -- -- (!) 47 (!) 25 96 % --  05/08/23 0100 (!) 121/48 -- -- (!) 45 (!) 25 96 % --  05/08/23 0000 126/70 98.1 F (36.7 C) Oral (!) 35 (!) 31 96 % --  05/07/23 2300 (!) 131/112 -- -- (!) 42 (!) 29 97 % --  05/07/23 2200 103/66 -- -- 60 (!) 34 97 % --  05/07/23 2100 139/83 -- -- -- (!) 34 -- --  05/07/23 2000 (!) 153/86 -- -- -- (!) 44 99 % --  05/07/23 1946 -- -- -- -- (!) 35 -- --  05/07/23 1945 --  98 F (36.7 C) Oral -- (!) 33 -- --  05/07/23 1900 130/88 -- -- -- (!) 29 100 % --  05/07/23 1800 (!) 145/88 -- -- (!) 105 (!) 29 97 % --  05/07/23 1700 134/77 -- -- (!) 41 (!) 33 97 % --  05/07/23 1600 (!) 122/92 -- -- -- (!) 35 -- --  05/07/23 1502 -- 98.7 F (37.1 C) Oral -- -- -- --  05/07/23 1500 135/65 -- -- (!) 49 (!) 35 97 % --  05/07/23 1400 (!) 129/91 -- -- (!) 57 (!) 38 95 % --  05/07/23 1300 137/65 -- -- -- (!) 36 -- --  05/07/23 1200 131/89 -- -- -- (!) 37 -- --  05/07/23 1130 -- 98.7 F (37.1 C) Oral -- -- -- --  05/07/23 1100 (!) 144/69 -- -- (!) 50 (!) 37 97 % --      PHYSICAL EXAM:  General: Alert, cooperative, no distress,  cervical collar Lungs: Clear to auscultation bilaterally. No Wheezing or Rhonchi. No rales. Heart: Regular rate and rhythm, no murmur, rub or gallop. Abdomen: Soft, non-tender,not distended. Bowel sounds normal. No masses Extremities: atraumatic, no cyanosis. No edema. No clubbing Skin: wounds Lymph: Cervical, supraclavicular normal. Neurologic: Grossly  non-focal  Lab Results    Latest Ref Rng & Units 05/08/2023    4:21 AM 05/07/2023    4:48 AM 05/06/2023    4:24 AM  CBC  WBC 4.0 - 10.5 K/uL 8.4  9.3  8.1   Hemoglobin 13.0 - 17.0 g/dL 44.0  10.2  72.5   Hematocrit 39.0 - 52.0 % 39.8  38.4  32.9   Platelets 150 - 400 K/uL 121  119  96        Latest Ref Rng & Units 05/08/2023    4:21 AM 05/07/2023    4:48 AM 05/06/2023    4:24 AM  CMP  Glucose 70 - 99 mg/dL 366  440  347   BUN 8 - 23 mg/dL 77  81  85   Creatinine 0.61 - 1.24 mg/dL 4.25  9.56  3.87   Sodium 135 - 145 mmol/L 137  138  136   Potassium 3.5 - 5.1 mmol/L 3.6  3.6  3.9   Chloride 98 - 111 mmol/L 101  104  101   CO2 22 - 32 mmol/L 26  24  24    Calcium 8.9 - 10.3 mg/dL 8.0  8.1  7.9   Total Protein 6.5 - 8.1 g/dL 5.5  5.9  5.2   Total Bilirubin 0.0 - 1.2 mg/dL 1.6  2.2  1.8   Alkaline Phos 38 - 126 U/L 96  98  77   AST 15 - 41 U/L 44  78  131   ALT 0 - 44 U/L 6  8  35       Microbiology: 05/03/23 4/4 MSSA 3/30 BC - NG Studies/Results: ECHO TEE Result Date: 05/07/2023    TRANSESOPHOGEAL ECHO REPORT   Patient Name:   [name]. Date of Exam: 05/07/2023 Medical Rec #:  [id number]         Height:       69.0 in Accession #:    [id number]        Weight:       161.2 lb Date of Birth:  09-19-1945        BSA:          1.885 m Patient Age:    4 years  BP:           137/78 mmHg Patient Gender: M                 HR:           97 bpm. Exam Location:  [facility] Procedure: Transesophageal Echo, Cardiac Doppler and Color Doppler (Both            Spectral and Color Flow Doppler were utilized during procedure). Indications:     Bacteremia R78.81  History:         Patient has prior history of Echocardiogram examinations, most                  recent 05/05/2023. CAD; Risk Factors:Diabetes and Hypertension.                  Persistent Afib.  Sonographer:     [name] Referring Phys:  [id number] [name] [employer] Diagnosing Phys: [name] PROCEDURE: The transesophogeal probe was passed without  difficulty through the esophogus of the patient. Sedation performed by different physician. The patient developed no complications during the procedure.  IMPRESSIONS  1. Left ventricular ejection fraction, by estimation, is 20 to 25%. The left ventricle has severely decreased function. The left ventricle demonstrates global hypokinesis. The left ventricular internal cavity size was mildly dilated. There is mild concentric left ventricular hypertrophy.  2. Peak RV-RA gradient 41 mmHg. Right ventricular systolic function is severely reduced. The right ventricular size is mildly enlarged.  3. Left atrial size was moderately dilated. No left atrial/left atrial appendage thrombus was detected.  4. Right atrial size was moderately dilated.  5. A small pericardial effusion is present. The pericardial effusion is circumferential.  6. No mitral valve vegetation. The mitral valve is normal in structure. Mild to moderate mitral valve regurgitation. No evidence of mitral stenosis.  7. No tricuspid valve vegetation.  8. No aortic valve vegetation. The aortic valve is tricuspid. Aortic valve regurgitation is not visualized. No aortic stenosis is present.  9. No pulmonic valve vegetation. 10. No PFO or ASD by color doppler. 11. No evidence for endocarditis FINDINGS  Left Ventricle: Left ventricular ejection fraction, by estimation, is 20 to 25%. The left ventricle has severely decreased function. The left ventricle demonstrates global hypokinesis. The left ventricular internal cavity size was mildly dilated. There is mild concentric left ventricular hypertrophy. Right Ventricle: Peak RV-RA gradient 41 mmHg. The right ventricular size is mildly enlarged. No increase in right ventricular wall thickness. Right ventricular systolic function is severely reduced. Left Atrium: Left atrial size was moderately dilated. No left atrial/left atrial appendage thrombus was detected. Right Atrium: Right atrial size was moderately dilated.  Pericardium: A small pericardial effusion is present. The pericardial effusion is circumferential. Mitral Valve: No mitral valve vegetation. The mitral valve is normal in structure. Mild to moderate mitral valve regurgitation. No evidence of mitral valve stenosis. Tricuspid Valve: No tricuspid valve vegetation. The tricuspid valve is normal in structure. Tricuspid valve regurgitation is mild. Aortic Valve: No aortic valve vegetation. The aortic valve is tricuspid. Aortic valve regurgitation is not visualized. No aortic stenosis is present. Pulmonic Valve: No pulmonic valve vegetation. The pulmonic valve was normal in structure. Pulmonic valve regurgitation is not visualized. Aorta: The aortic root is normal in size and structure. IAS/Shunts: No PFO or ASD by color doppler. [employer] Electronically signed by [name] Signature Date/Time: 05/07/2023/1:14:36 PM    Final    DG Chest Port 1 View Result Date:  05/06/2023 CLINICAL DATA:  PICC line placement EXAM: PORTABLE CHEST 1 VIEW COMPARISON:  05/03/2023 FINDINGS: Single frontal view of the chest demonstrates interval placement of a left-sided PICC, tip overlying superior vena cava. The cardiac silhouette remains enlarged. There are increasing bibasilar veiling opacities, left greater than right, consistent with bibasilar consolidation and effusions. No pneumothorax. No acute bony abnormalities. IMPRESSION: 1. Left-sided PICC, tip overlying SVC. 2. Increasing bibasilar veiling opacities, consistent with progressive consolidation and effusions. Electronically Signed   By: Sharlet Salina M.D.   On: 05/06/2023 16:35     Assessment/Plan: Staph aureus bacteremia- source  foot wound Culture sent from the foot wound positive for staph aureus Pt is on cefazolin He has a deep focus of infection in the cervical spine IR does not think there is any fluid collection to aspirate TEE no endocarditis Will need long term antibiotic- 6 weeks of IV  cefazolin Discussed with neurosurgery- no plans for washout- manage with antibiotics   Degenerative disc disease of cervical spine With some thinning VS disruption of the longitudinal ligamnet- has a cervical collar   Fall at home Was not aware how he fell or why?   AKI on CKD Abnormal LFTs improving    Rhabdomyolysis due to Fall and being on the floor CK improving  CHF- EF 20-25%   Anemia   Afib - pt on coumadin  Chronic venous changes legs with healed wounds except one   DM on insulin     H/o sigmoid ca- s/p surgery H/o chemo     Discussed the management with care team

## 2023-05-08 NOTE — Plan of Care (Signed)
  Problem: Fluid Volume: Goal: Hemodynamic stability will improve Outcome: Progressing   Problem: Clinical Measurements: Goal: Signs and symptoms of infection will decrease Outcome: Progressing   Problem: Respiratory: Goal: Ability to maintain adequate ventilation will improve Outcome: Progressing   Problem: Clinical Measurements: Goal: Respiratory complications will improve Outcome: Progressing   Problem: Coping: Goal: Level of anxiety will decrease Outcome: Progressing   Problem: Elimination: Goal: Will not experience complications related to urinary retention Outcome: Progressing   Problem: Pain Managment: Goal: General experience of comfort will improve and/or be controlled Outcome: Progressing   Problem: Skin Integrity: Goal: Risk for impaired skin integrity will decrease Outcome: Progressing   Problem: Fluid Volume: Goal: Ability to maintain a balanced intake and output will improve Outcome: Progressing   Problem: Tissue Perfusion: Goal: Adequacy of tissue perfusion will improve Outcome: Progressing

## 2023-05-08 NOTE — Consult Note (Signed)
 WOC Nurse Consult Note: patient is followed at [facility], last seen there 04/30/2023 and collagen (Prisma) dressing applied to L ankle/dorsal foot and L lower leg wounds; collagen dressings are not on formulary at [facility] and silver (Aquacel AG) is used as substitute  Reason for Consult: B lower leg wounds Wound type: 1. Full thickness L anterior ankle/foot and L lower leg  2.  Healed full thickness wound to R lower leg  Pressure Injury POA: NA, not related to pressure  Measurement: see nursing flowsheet  Wound bed: L ankle/lower leg 100% tan necrotic; L anterior lower leg 100% yellow slough  Drainage (amount, consistency, odor) see nursing flowsheet  Periwound: edema, hyperkeratotic ski  Dressing procedure/placement/frequency: Cleanse L lower leg/ankle/foot wounds with Vashe wound cleanser Hart [place] [phone]), do not rinse and allow to air dry. Apply silver hydrofiber Hart [place] [phone]) to wound beds daily, cover silicone foam.  Apply Eucerin to intact skin. Wrap with Kerlix roll gauze beginning right above toes and ending right below knee. Cover with Ace bandage wrapped in same fashion as Kerlix for light compression.   Cleanse R lower leg healed wound and intact skin with Vashe wound cleanser Hart [place] [phone]), do not rinse and allow to air dry. Apply Eucerin to entire leg 2 times daily.     If patient has compression hose from home ok to apply to right leg after applying Eucerin.    Patient should continue Prisma when returns to home setting and should continue follow-up with wound care center at discharge.   POC discussed with bedside nurse. WOC team will not follow. Re-consult if further needs arise.   Thank you,    Priscella Mann MSN, RN-BC, [employer] [phone]

## 2023-05-09 ENCOUNTER — Telehealth (HOSPITAL_COMMUNITY): Payer: Self-pay

## 2023-05-09 ENCOUNTER — Encounter (INDEPENDENT_AMBULATORY_CARE_PROVIDER_SITE_OTHER): Payer: PPO

## 2023-05-09 ENCOUNTER — Telehealth: Payer: Self-pay

## 2023-05-09 ENCOUNTER — Encounter: Payer: Self-pay | Admitting: Hematology and Oncology

## 2023-05-09 ENCOUNTER — Other Ambulatory Visit (HOSPITAL_COMMUNITY): Payer: Self-pay

## 2023-05-09 ENCOUNTER — Ambulatory Visit (INDEPENDENT_AMBULATORY_CARE_PROVIDER_SITE_OTHER): Payer: PPO | Admitting: Nurse Practitioner

## 2023-05-09 DIAGNOSIS — I5023 Acute on chronic systolic (congestive) heart failure: Secondary | ICD-10-CM | POA: Diagnosis not present

## 2023-05-09 DIAGNOSIS — R6520 Severe sepsis without septic shock: Secondary | ICD-10-CM | POA: Diagnosis not present

## 2023-05-09 DIAGNOSIS — A419 Sepsis, unspecified organism: Secondary | ICD-10-CM | POA: Diagnosis not present

## 2023-05-09 LAB — CBC WITH DIFFERENTIAL/PLATELET
Abs Immature Granulocytes: 0.04 10*3/uL (ref 0.00–0.07)
Basophils Absolute: 0 10*3/uL (ref 0.0–0.1)
Basophils Relative: 0 %
Eosinophils Absolute: 0.1 10*3/uL (ref 0.0–0.5)
Eosinophils Relative: 1 %
HCT: 39.8 % (ref 39.0–52.0)
Hemoglobin: 12.9 g/dL — ABNORMAL LOW (ref 13.0–17.0)
Immature Granulocytes: 1 %
Lymphocytes Relative: 9 %
Lymphs Abs: 0.6 10*3/uL — ABNORMAL LOW (ref 0.7–4.0)
MCH: 33.1 pg (ref 26.0–34.0)
MCHC: 32.4 g/dL (ref 30.0–36.0)
MCV: 102.1 fL — ABNORMAL HIGH (ref 80.0–100.0)
Monocytes Absolute: 0.8 10*3/uL (ref 0.1–1.0)
Monocytes Relative: 11 %
Neutro Abs: 5.6 10*3/uL (ref 1.7–7.7)
Neutrophils Relative %: 78 %
Platelets: 131 10*3/uL — ABNORMAL LOW (ref 150–400)
RBC: 3.9 MIL/uL — ABNORMAL LOW (ref 4.22–5.81)
RDW: 17.5 % — ABNORMAL HIGH (ref 11.5–15.5)
WBC: 7.1 10*3/uL (ref 4.0–10.5)
nRBC: 0 % (ref 0.0–0.2)

## 2023-05-09 LAB — COOXEMETRY PANEL
Carboxyhemoglobin: 1.8 % — ABNORMAL HIGH (ref 0.5–1.5)
Carboxyhemoglobin: 2.2 % — ABNORMAL HIGH (ref 0.5–1.5)
Methemoglobin: 0.8 % (ref 0.0–1.5)
O2 Saturation: 61.7 %
O2 Saturation: 67.1 %
Total hemoglobin: 13.4 g/dL (ref 12.0–16.0)
Total hemoglobin: 13.1 g/dL (ref 12.0–16.0)
Total oxygen content: 60.4 % (ref 0.0–1.5)
Total oxygen content: 65.1 %

## 2023-05-09 LAB — AEROBIC CULTURE W GRAM STAIN (SUPERFICIAL SPECIMEN)

## 2023-05-09 LAB — GLUCOSE, CAPILLARY
Glucose-Capillary: 177 mg/dL — ABNORMAL HIGH (ref 70–99)
Glucose-Capillary: 234 mg/dL — ABNORMAL HIGH (ref 70–99)
Glucose-Capillary: 176 mg/dL — ABNORMAL HIGH (ref 70–99)
Glucose-Capillary: 159 mg/dL — ABNORMAL HIGH (ref 70–99)

## 2023-05-09 LAB — BASIC METABOLIC PANEL WITH GFR
Anion gap: 10 (ref 5–15)
BUN: 69 mg/dL — ABNORMAL HIGH (ref 8–23)
CO2: 28 mmol/L (ref 22–32)
Calcium: 8.3 mg/dL — ABNORMAL LOW (ref 8.9–10.3)
Chloride: 102 mmol/L (ref 98–111)
Creatinine, Ser: 2.13 mg/dL — ABNORMAL HIGH (ref 0.61–1.24)
GFR, Estimated: 31 mL/min — ABNORMAL LOW (ref >60)
Glucose, Bld: 167 mg/dL — ABNORMAL HIGH (ref 70–99)
Potassium: 3.5 mmol/L (ref 3.5–5.1)
Sodium: 140 mmol/L (ref 135–145)

## 2023-05-09 LAB — PROTIME-INR
INR: 4.5 (ref 0.8–1.2)
Prothrombin Time: 42.9 s — ABNORMAL HIGH (ref 11.4–15.2)

## 2023-05-09 MED ORDER — SPIRONOLACTONE 12.5 MG HALF TABLET
12.5000 mg | ORAL_TABLET | Freq: Every day | ORAL | Status: DC
  Administered 2023-05-09 – 2023-05-14 (×6): 12.5 mg via ORAL
  Filled 2023-05-09 (×6): qty 1

## 2023-05-09 MED ORDER — AMIODARONE HCL 200 MG PO TABS
400.0000 mg | ORAL_TABLET | Freq: Two times a day (BID) | ORAL | Status: DC
  Administered 2023-05-09 – 2023-05-14 (×11): 400 mg via ORAL
  Filled 2023-05-09 (×12): qty 2

## 2023-05-09 MED ORDER — TORSEMIDE 20 MG PO TABS
40.0000 mg | ORAL_TABLET | Freq: Every day | ORAL | Status: DC
  Administered 2023-05-09 – 2023-05-13 (×5): 40 mg via ORAL
  Filled 2023-05-09 (×5): qty 2

## 2023-05-09 MED ORDER — POTASSIUM CHLORIDE CRYS ER 20 MEQ PO TBCR
40.0000 meq | EXTENDED_RELEASE_TABLET | Freq: Once | ORAL | Status: AC
  Administered 2023-05-09: 40 meq via ORAL
  Filled 2023-05-09: qty 2

## 2023-05-09 NOTE — Telephone Encounter (Signed)
 Advanced Heart Failure Patient Advocate Encounter  The patient was approved for a [employer] grant that will help cover the cost of Carvedilol, Farxiga, Losartan, Spironolactone.  Total amount awarded, $10,000.  Effective: 04/09/2023 - 04/07/2024.  BIN [id number] PCN [id number] [id number] ID [id number]  Pharmacy provided with approval and processing information. Patient informed while inpatient.  Burnell Blanks, CPhT Rx Patient Advocate Phone: [phone]

## 2023-05-09 NOTE — Evaluation (Signed)
 Occupational Therapy Evaluation Patient Details Name: [name]. MRN: [id number] DOB: [birth year] Today's Date: 05/09/2023   History of Present Illness   Patient is a 78 year old male found down at home. Found to have severe sepsis, acute on chronic systolic CHF, MSSA bacteremia, atrial fibrillation s/p cardioversion, C-spine prevertebral edema   Clinical Impressions Mr Paullin was seen for OT evaluation this date. Prior to hospital admission, pt was IND. Pt lives with spouse. Pt currently requires MOD A exit bed. MOD A x2 + HHA sit<>stand and bed>chair step pivot t/f. R grip 4/5, shoulder flexion AROM ~85*. C-collar adjusted for improved fit. Pt would benefit from skilled OT to address noted impairments and functional limitations (see below for any additional details). Upon hospital discharge, recommend OT follow up <3 hours/day.    If plan is discharge home, recommend the following:   Two people to help with walking and/or transfers;Two people to help with bathing/dressing/bathroom     Functional Status Assessment   Patient has had a recent decline in their functional status and demonstrates the ability to make significant improvements in function in a reasonable and predictable amount of time.     Equipment Recommendations   Other (comment) (defer)     Recommendations for Other Services         Precautions/Restrictions   Precautions Precautions: Fall;Cervical Precaution Booklet Issued: No Recall of Precautions/Restrictions: Impaired Restrictions Weight Bearing Restrictions Per Provider Order: No     Mobility Bed Mobility Overal bed mobility: Needs Assistance Bed Mobility: Supine to Sit     Supine to sit: Mod assist, +2 for safety/equipment          Transfers Overall transfer level: Needs assistance Equipment used: 2 person hand held assist Transfers: Sit to/from Stand, Bed to chair/wheelchair/BSC Sit to Stand: Mod assist, +2 physical assistance,  +2 safety/equipment     Step pivot transfers: Mod assist, +2 physical assistance, +2 safety/equipment            Balance Overall balance assessment: Needs assistance Sitting-balance support: No upper extremity supported Sitting balance-[name]: Good     Standing balance support: Bilateral upper extremity supported, During functional activity Standing balance-[name]: Poor                             ADL either performed or assessed with clinical judgement   ADL Overall ADL's : Needs assistance/impaired                                       General ADL Comments: MOD A x2 + HHA for simulated BSC t/f.       Pertinent Vitals/Pain Pain Assessment Pain Assessment: Faces Faces Pain Scale: No hurt Pain Intervention(s): Monitored during session     Extremity/Trunk Assessment Upper Extremity Assessment Upper Extremity Assessment: RUE deficits/detail;LUE deficits/detail RUE Deficits / Details: grip 4/5, shoulder flexion AROM ~85* LUE Deficits / Details: grip 5/5, shoulder flexion AROM ~85*   Lower Extremity Assessment Lower Extremity Assessment: Generalized weakness       Communication Communication Communication: No apparent difficulties   Cognition Arousal: Alert Behavior During Therapy: WFL for tasks assessed/performed Cognition: No apparent impairments                               Following commands: Intact  Cueing  General Comments   Cueing Techniques: Verbal cues      Exercises     Shoulder Instructions      Home Living Family/patient expects to be discharged to:: Private residence Living Arrangements: Spouse/significant other;Children Available Help at Discharge: Family Type of Home: House Home Access: Stairs to enter [employer] of Steps: 2 Entrance Stairs-Rails: Right;Left                 Home Equipment: Agricultural consultant (2 wheels);Cane - single point           Prior Functioning/Environment Prior Level of Function : Independent/Modified Independent             Mobility Comments: patient reports he is slow but no AD use      OT Problem List: Decreased strength;Decreased range of motion;Decreased activity tolerance;Impaired balance (sitting and/or standing)   OT Treatment/Interventions: Self-care/ADL training;Therapeutic exercise;Energy conservation;DME and/or AE instruction;Therapeutic activities      OT Goals(Current goals can be found in the care plan section)   Acute Rehab OT Goals Patient Stated Goal: to go home OT Goal Formulation: With patient Time For Goal Achievement: 05/23/23 Potential to Achieve Goals: Good ADL Goals Pt Will Perform Grooming: with modified independence;standing Pt Will Perform Lower Body Dressing: with modified independence;sit to/from stand Pt Will Transfer to Toilet: with modified independence;ambulating;regular height toilet   OT Frequency:  Min 2X/week    Co-evaluation PT/OT/SLP Co-Evaluation/Treatment: Yes Reason for Co-Treatment: For patient/therapist safety;To address functional/ADL transfers PT goals addressed during session: Mobility/safety with mobility;Balance OT goals addressed during session: ADL's and self-care      AM-PAC OT "6 Clicks" Daily Activity     Outcome Measure Help from another person eating meals?: None Help from another person taking care of personal grooming?: A Little Help from another person toileting, which includes using toliet, bedpan, or urinal?: A Lot Help from another person bathing (including washing, rinsing, drying)?: A Lot Help from another person to put on and taking off regular upper body clothing?: A Little Help from another person to put on and taking off regular lower body clothing?: A Lot 6 Click Score: 16   End of Session Nurse Communication: Mobility status  Activity Tolerance: Patient tolerated treatment well Patient left: in chair;with call  bell/phone within reach;with chair alarm set  OT Visit Diagnosis: Other abnormalities of gait and mobility (R26.89);Muscle weakness (generalized) (M62.81)                Time: 1030-1055 OT Time Calculation (min): 25 min Charges:  OT General Charges $OT Visit: 1 Visit OT Evaluation $OT Eval Moderate Complexity: 1 Mod OT Treatments $Self Care/Home Management : 8-22 mins  Kathie Dike, M.S. OTR/L  05/09/23, 12:42 PM  ascom [phone]

## 2023-05-09 NOTE — Consult Note (Signed)
 PHARMACY - ANTICOAGULATION CONSULT NOTE  Pharmacy Consult for Warfarin Indication: atrial fibrillation  Allergies  Allergen Reactions   Shellfish Allergy Other (See Comments) and Hives    Pt. instructed by MD to avoid seafood    Patient Measurements: Height: 5\' 9"  (175.3 cm) Weight: 72.4 kg (159 lb 9.8 oz) IBW/kg (Calculated) : 70.7 HEPARIN DW (KG): 73.1  Vital Signs: Temp: 98.6 F (37 C) (04/02 1930) Temp Source: Oral (04/02 1930) BP: 132/77 (04/03 0600) Pulse Rate: 68 (04/03 0600)  Labs: Recent Labs    05/07/23 0448 05/08/23 0421 05/08/23 2019 05/09/23 0430  HGB 12.8* 13.1  --   --   HCT 38.4* 39.8  --   --   PLT 119* 121*  --   --   LABPROT 35.0* 42.9*  --  42.9*  INR 3.5* 4.5*  --  4.5*  CREATININE 2.44* 2.44*  --  2.13*  CKTOTAL  --   --  160  --     Estimated Creatinine Clearance: 29.5 mL/min (A) (by C-G formula based on SCr of 2.13 mg/dL (H)).   Medical History: Past Medical History:  Diagnosis Date   Adenocarcinoma of colon (HCC) 06/19/2013   a. moderately differentiated stage IIIc (T4bN2a M0) adenocarcinoma of colon  s/p colectomy followed by chemoRx with oxaliplatin & fluorouacil.   Anemia    CAD (coronary artery disease)    a. 02/2015 Cath: LM nl, LAD 50/40 mid/distal, LCX 80p, RCA 40p, 30d, RPL1 40, CO 3.27, CI 1.65; b. 06/09/15 PCI: LM minor irregs, m-dLAD 50%, dLAD 40%, pLCx 80% (s/p PCI/DES 0%), pRCA 40%, dRCA 30%, 1st RPLB 40%   Chronic kidney disease, stage 3a (HCC)    Chronic systolic CHF (congestive heart failure) (HCC)    a. 02/2015 Echo: EF 20-25%, diff HK, mod MR, mildy dil LA, mildly dil RV w/ mod RV syst dysfxn, mildly dil RA, mod TR, PASP ; b. 09/2017 Echo: EF 40-45%, diff HK. Gr1 DD. Mild to mod MR. Mildly to mod dil LA; c. 09/2019 Echo: EF 25-30%, glob HK, mod reduced RV fxn, sev elev PASP. Mod dil LA. Mild-mod MR; d. 05/2021 Echo: EF 20-25%, glob HK, sev red RV fxn, sev BAE, sev MR/TR/TS, triv AI.   Diabetes mellitus without  complication (HCC)    Essential hypertension    Hypertensive heart disease    Left inguinal hernia 05/2020   Mixed Ischemic and Non-ischemic Cardiomyopathy    a. 02/2015 EF 20-25%, diff HK; b. 09/2016 EF 40-45%; c. 09/2019 EF 25-30%; d. 05/2021 Echo: EF 20-25%, glob HK.   Persistent atrial fibrillation (HCC)    a. Dx 05/2021-->CHA2DS2VASc = 5-->eliquis.   Pulmonary hypertension (HCC)    Severe mitral regurgitation    a. 09/2017 Echo: Mild to mod MR; b. 09/2019 Echo: mild-mod MR; c. 05/2021 Echo: Sev MR/TR/TS.   Tubular adenoma of colon     Medications:  Per note from Anti-Coag visit on 04/24/23 patient taking Warfarin 2.5 mg po daily.  Assessment: 78 yo male presenting to ED by EMS due to being found in floor by wife.  PMH includes Afib on Warfarin, CAD, DM, HTN, CHF, and CKD.  Patient treated for sepsis likely due to pneumonia.  Pharmacy consulted to manage Warfarin.  Goal of Therapy:  INR 2-3 Monitor platelets by anticoagulation protocol: Yes    Date INR Warfarin Dose  3/28 5.6 Hold  3/29 7.2 Hold + vitamin IV 10 mg x 1  3/30 2.4 2.5 mg  3/31 2.6 2.5 mg  4/01 3.5 HOLD  4/02 4.5 HOLD  4/03 4.5 HOLD     Plan:  ---INR is supratherapeutic.  ---Will hold warfarin again today ---Daily INR. CBC at least every 3 days.   [name], PharmD 05/09/2023,7:04 AM

## 2023-05-09 NOTE — Progress Notes (Signed)
 Patient ID: [name]., male   DOB: 15-Oct-1945, 78 y.o.   MRN: [id number]     Advanced Heart Failure Rounding Note  Cardiologist: Lorine Bears, MD  Chief Complaint: CHF Subjective:    4/1 : TEE - no endocarditis. DC-CV - conversion to SR.   He remains in NSR today.  CVP 6, co-ox 67%. Creatinine 2.44 => 2.13. I/Os net negative 2937 and weight down.   No dyspnea.   Objective:   Weight Range: 72.4 kg Body mass index is 23.57 kg/m.   Vital Signs:   Temp:  [98.4 F (36.9 C)-98.7 F (37.1 C)] 98.6 F (37 C) (04/02 1930) Pulse Rate:  [34-114] 68 (04/03 0600) Resp:  [16-39] 20 (04/03 0600) BP: (110-146)/(58-118) 132/77 (04/03 0600) SpO2:  [93 %-100 %] 93 % (04/03 0600) Weight:  [72.4 kg] 72.4 kg (04/03 0500) Last BM Date : 05/06/23  Weight change: Filed Weights   05/04/23 0406 05/08/23 0400 05/09/23 0500  Weight: 73.1 kg 75.3 kg 72.4 kg    Intake/Output:   Intake/Output Summary (Last 24 hours) at 05/09/2023 0815 Last data filed at 05/09/2023 8295 Gross per 24 hour  Intake 562.69 ml  Output 3500 ml  Net -2937.31 ml     CVP 6 Physical Exam    General: NAD Neck: No JVD, no thyromegaly or thyroid nodule.  Lungs: Clear to auscultation bilaterally with normal respiratory effort. CV: Nondisplaced PMI.  Heart regular S1/S2, no S3/S4, no murmur.  No peripheral edema.    Abdomen: Soft, nontender, no hepatosplenomegaly, no distention.  Skin: Intact without lesions or rashes.  Neurologic: Alert and oriented x 3.  Psych: Normal affect. Extremities: No clubbing or cyanosis. Left lower leg wrapped.  HEENT: Normal.   Telemetry   SR with occasional PVCs (personally reviewed)   Labs    CBC Recent Labs    05/07/23 0448 05/08/23 0421  WBC 9.3 8.4  NEUTROABS 7.2 6.5  HGB 12.8* 13.1  HCT 38.4* 39.8  MCV 99.7 100.3*  PLT 119* 121*   Basic Metabolic Panel Recent Labs    62/13/08 0421 05/09/23 0430  NA 137 140  K 3.6 3.5  CL 101 102  CO2 26 28  GLUCOSE  216* 167*  BUN 77* 69*  CREATININE 2.44* 2.13*  CALCIUM 8.0* 8.3*   Liver Function Tests Recent Labs    05/07/23 0448 05/08/23 0421  AST 78* 44*  ALT 8 6  ALKPHOS 98 96  BILITOT 2.2* 1.6*  PROT 5.9* 5.5*  ALBUMIN 2.6* 2.1*   No results for input(s): "LIPASE", "AMYLASE" in the last 72 hours. Cardiac Enzymes Recent Labs    05/08/23 2019  CKTOTAL 160    BNP: BNP (last 3 results) Recent Labs    08/21/22 1142 12/13/22 1140 04/16/23 1541  BNP 791.1* 2,083.6* 1,510.6*    ProBNP (last 3 results) No results for input(s): "PROBNP" in the last 8760 hours.   D-Dimer No results for input(s): "DDIMER" in the last 72 hours. Hemoglobin A1C No results for input(s): "HGBA1C" in the last 72 hours.  Fasting Lipid Panel No results for input(s): "CHOL", "HDL", "LDLCALC", "TRIG", "CHOLHDL", "LDLDIRECT" in the last 72 hours. Thyroid Function Tests No results for input(s): "TSH", "T4TOTAL", "T3FREE", "THYROIDAB" in the last 72 hours.  Invalid input(s): "FREET3"  Other results:   Imaging    No results found.    Medications:     Scheduled Medications:  amiodarone  400 mg Oral BID   atorvastatin  40 mg Oral Daily  Chlorhexidine Gluconate Cloth  6 each Topical Daily   dapagliflozin propanediol  10 mg Oral Daily   feeding supplement  237 mL Oral BID BM   guaiFENesin  600 mg Oral BID   hydrocerin   Topical BID   insulin aspart  0-5 Units Subcutaneous QHS   insulin aspart  0-9 Units Subcutaneous TID WC   mupirocin ointment   Nasal BID   ondansetron (ZOFRAN) IV  4 mg Intravenous Once   mouth rinse  15 mL Mouth Rinse 4 times per day   pneumococcal 20-valent conjugate vaccine  0.5 mL Intramuscular Tomorrow-1000   potassium chloride  40 mEq Oral Once   sodium chloride flush  10-40 mL Intracatheter Q12H   spironolactone  12.5 mg Oral Daily   torsemide  40 mg Oral Daily   Warfarin - Pharmacist Dosing Inpatient   Does not apply q1600    Infusions:   ceFAZolin (ANCEF)  IV 2 g (05/08/23 2208)    PRN Medications: acetaminophen **OR** acetaminophen, albuterol, alum & mag hydroxide-simeth, HYDROcodone-acetaminophen, LORazepam, ondansetron **OR** ondansetron (ZOFRAN) IV, mouth rinse, sodium chloride flush     Assessment/Plan   1. Acute on chronic systolic CHF: With mixed cardiogenic/septic shock initially, now resolved. Has not been on pressors.  Last lactate 1.2.  Patient has history of primarily nonischemic cardiomyopathy.  He has refused ICD.  He has had trouble getting his HF medications.  Echo this admission was unchanged from past with EF 20-25%, global HK, moderate LVH, severe RV dysfunction, mild-moderate MR, moderate-severe TR, IVC dilated.  Exam is difficult for volume given c-spine collar.  Co-ox 67%, CVP 6 today.  Good diuresis yesterday with IV Lasix. Creatinine trending down, 2.13 today.  - Stop IV Lasix, start torsemide 40 mg daily.  - Add spironolactone 12.5 daily.  - Continue Farxiga 10 daily.  2. MSSA bacteremia: Source is likely LLE wound (growing MSSA), also has C4/C5 prevertebral collection concerning for discitis. TEE with no endocarditis.  ID following.  - Continue cefazolin x 6 wks.  3. AKI on CKD stage 3: Baseline creatinine around 1.7.  Creatinine 2.57 => 2.4 => 2.13.  Suspect due to septic shock with cardiorenal component.  4. Atrial fibrillation: Persistent.  He was in atrial fibrillation at last outpatient appt but was in NSR prior to that. S/P DC-CV with conversion to SR. Remains in NSR today.  - Transition to amiodarone 400 mg bid.  - Warfarin held with elevated INR.  5. Elevated CPK: Mild rhabdomyolysis due to fall, peak CK 2978.  6. Chronic LLE wound: Suspect source for MSSA bacteremia.  7. Elevated LFTs: Suspect shock liver, trending down.  - Continue statin. 8. H/o colon cancer 9. C-spine prevertebral edema: Upper c-spine with edema in the C4-C5 disc, ?infectious. No drainable fluid collection per IR.  - He is in a c-spine  collar.  - Neurosurgery following, no plan for surgery.   Length of Stay: 6  Marca Ancona, MD  05/09/2023, 8:15 AM  Advanced Heart Failure Team Pager [phone] (M-F; 7a - 5p)  Please contact [facility] for night-coverage after hours (5p -7a ) and weekends on [email or web address]

## 2023-05-09 NOTE — Progress Notes (Signed)
 Progress Note   Patient: [name]. XBJ:[id number] DOB: [birth year] DOA: 05/03/2023     6 DOS: the patient was seen and examined on 05/09/2023      Brief hospital course: From HPI: [name]. is a 78 y.o. male with medical history significant for CAD s/p stenting, chronic HFrEF secondary to NICM (EF 20 to 25% 1/17), persistent A-fib on warfarin due to cost of Eliquis, HTN, HLD, CKD 3B, and colon cancer 2015, who is being admitted with multiple acute derangements, related to a fall off his bed with no recollection of the circumstances.  Per wife at bedside, she left patient in bed this morning and when she returned home at 10 AM patient was lying on the floor on his stomach with his head turned to the side.  She states he was awake and alert.  He was unable to get up.  At baseline he does not walk with a cane or walker and typically he would have been able to get up on his own. ED course and data review: Tachycardic to 121 with soft blood pressure of 105/75. Respiratory viral panel negative for COVID flu and RSV CT abdomen and pelvis with no acute abnormalities Chest x-ray showing left lower lobe consolidation likely atelectasis, pneumonia not ruled out CT C-spine nontraumatic but concerning for prevertebral fluid with recommendations for follow-up MRI to evaluate for osteomyelitis discitis. TRH showing therefore contacted for admission "       Assessment and Plan:  Severe sepsis (HCC) Severe sepsis versus cardiogenic Possible pneumonia Possible discitis MSSA bacteremia MRI showed large volume prevertebral edema in the advanced cervical spine around C4-C5 Severe sepsis criteria include tachycardia, leukocytosis with lactic acidosis, AKI and hyperbilirubinemia Blood culture growing MSSA Continue cefazolin as agreed by infectious disease Follow-up on culture results Infectious disease on board and case discussed Continue current antibiotics Patient underwent TEE on 05/07/2023  that did not show any evidence of endocarditis.  Patient had DCCV after TEE after TEE   Pneumonia, possible Continue current antibiotics Wean off oxygen as tolerated     Abnormal MRI, cervical spine Paravertebral C-spine fluid collection/edema --possible osteomyelitis/discitis Chronic baseline neck pain Patient was seen by currently there is no drainable fluid in the cervical area  We will continue to monitor  Plan of care discussed with neurosurgeon Neurosurgery recommends to keep cervical collar in place     Syncope, presumed Echo did not show any findings of vegetation and EF is unchanged from prior echo Continue telemetry monitoring   Abnormal LFTs--possible biliary tract disease Elevated lipase Abnormal LFTs suspect related to hepatic congestion from low output cardiac failure versus severe sepsis Patient has been seen by gastroenterologist with no new recommendations Abdominal ultrasound showing cholelithiasis with no evidence of acute cholecystitis Monitor LFTs   CAD S/P percutaneous coronary angioplasty Elevated troponin Troponin 340 but suspecting demand ischemia Cardiologist on board Continue statin therapy   Atrial fibrillation with rapid ventricular response (HCC) Cardiologist on board and case discussed Pharmacy on board with anticoagulant management in the setting of supratherapeutic INR on presentation Continue amiodarone   Acute renal failure superimposed on stage 3b chronic kidney disease (HCC) Second to severe sepsis versus low output and low flow state Monitor for improvement with above treatment Continue to monitor input and  output  Renal function peaked and now showing some improvement If renal function were to worsen we will consider nephrology consultation   Traumatic rhabdomyolysis (HCC) Accidental fall from bed-presumed syncope Continue PT OT  Acute on chronic HFrEF (heart failure with reduced ejection fraction) (HCC) NICM, EF 20 to  25%-declined AICD in the past Cardiologist on board and case discussed IV Lasix switched to torsemide Continue spironolactone, Farxiga as recommended by cardiologist    Supratherapeutic INR Patient received vitamin K Continue amiodarone as recommended by cardiologist Warfarin on hold on account of elevated INR on presentation   Thrombocytopenia (HCC) Monitor platelet closely   History of colon cancer Neuropathy related to chemotherapy - No acute disuse     DVT prophylaxis: SCD   Consults: Neurosurgery, dimensional radiology, cardiology   Advance Care Planning:   Code Status: Full Code    Family Communication: wife at bedside   Disposition Plan: Back to previous home environment   Subjective:  Patient seen and examined at bedside this morning Patient still in cervical collar Denies worsening respiratory function chest pain cough nausea vomiting Central venous catheter was removed and patient will be moved to the floor today   Physical Exam:   Constitutional:      General: He is not in acute distress.    Interventions: Cervical collar in place.  HENT:     Head: Normocephalic and atraumatic.  Cardiovascular:     Heart sounds: No murmur heard.    No systolic murmur is present.     No diastolic murmur is present.  Pulmonary:     Effort: Pulmonary effort is normal.     Breath sounds: Normal breath sounds.  Abdominal:     Palpations: Abdomen is soft.     Tenderness: There is no abdominal tenderness.  Musculoskeletal: Lateral lower extremity edema Neurological: Limited range of motion involving bilateral shoulders more pronounced on the right.  Able to move bilateral lower extremity with some generalized weakness    General: No focal deficit present.     Mental Status: Mental status is at baseline   Data Reviewed:        Latest Ref Rng & Units 05/09/2023    9:35 AM 05/08/2023    4:21 AM 05/07/2023    4:48 AM  CBC  WBC 4.0 - 10.5 K/uL 7.1  8.4  9.3   Hemoglobin  13.0 - 17.0 g/dL 56.2  13.0  86.5   Hematocrit 39.0 - 52.0 % 39.8  39.8  38.4   Platelets 150 - 400 K/uL 131  121  119        Latest Ref Rng & Units 05/09/2023    4:30 AM 05/08/2023    4:21 AM 05/07/2023    4:48 AM  BMP  Glucose 70 - 99 mg/dL 784  696  295   BUN 8 - 23 mg/dL 69  77  81   Creatinine 0.61 - 1.24 mg/dL 2.84  1.32  4.40   Sodium 135 - 145 mmol/L 140  137  138   Potassium 3.5 - 5.1 mmol/L 3.5  3.6  3.6   Chloride 98 - 111 mmol/L 102  101  104   CO2 22 - 32 mmol/L 28  26  24    Calcium 8.9 - 10.3 mg/dL 8.3  8.0  8.1       Vitals:   05/09/23 1000 05/09/23 1100 05/09/23 1120 05/09/23 1200  BP: 131/63 (!) 104/58  120/68  Pulse:  62 (!) 37 (!) 39  Resp: (!) 22 (!) 33 (!) 33 (!) 30  Temp:      TempSrc:      SpO2: 93% 91% (!) 85% 99%  Weight:  Height:        Author: Loyce Dys, MD 05/09/2023 1:36 PM  For on call review [email or web address].

## 2023-05-09 NOTE — TOC Initial Note (Signed)
 Transition of Care (TOC) - Initial/Assessment Note    Patient Details  Name: [name]. MRN: [id number] Date of Birth: [birth year]  Transition of [facility]) CM/SW Contact:    Margarito Liner, LCSW Phone Number: 05/09/2023, 1:01 PM  Clinical Narrative:  CSW met with patient. No supports at bedside. CSW introduced role and explained that therapy recommendations would be discussed. Patient is not interested in SNF placement because he does not want to leave his wife at home. He is agreeable to home health. CSW sent secure chat to PT and OT to notify. Per OT, patient has not been able to walk yet but if he does go home, he will likely need a RW, wheelchair, BSC, and potentially a hospital bed.                Expected Discharge Plan: Home w Home Health Services Barriers to Discharge: Continued Medical Work up   Patient Goals and CMS Choice            Expected Discharge Plan and Services     Post Acute Care Choice: Home Health, Durable Medical Equipment Living arrangements for the past 2 months: Single Family Home                                      Prior Living Arrangements/Services Living arrangements for the past 2 months: Single Family Home Lives with:: Spouse Patient language and need for interpreter reviewed:: Yes Do you feel safe going back to the place where you live?: Yes      Need for Family Participation in Patient Care: Yes (Comment) Care giver support system in place?: Yes (comment)   Criminal Activity/Legal Involvement Pertinent to Current Situation/Hospitalization: No - Comment as needed  Activities of Daily Living   ADL Screening (condition at time of admission) Independently performs ADLs?: Yes (appropriate for developmental age) Is the patient deaf or have difficulty hearing?: No Does the patient have difficulty seeing, even when wearing glasses/contacts?: No Does the patient have difficulty concentrating, remembering, or making decisions?:  No  Permission Sought/Granted                  Emotional Assessment Appearance:: Appears stated age Attitude/Demeanor/Rapport: Engaged Affect (typically observed): Appropriate Orientation: : Oriented to Self, Oriented to Place, Oriented to  Time, Oriented to Situation Alcohol / Substance Use: Not Applicable Psych Involvement: No (comment)  Admission diagnosis:  Elevated troponin [R79.89] Injury of neck, initial encounter [S19.9XXA] Sepsis (HCC) [A41.9] Non-traumatic rhabdomyolysis [M62.82] Pneumonia of left lower lobe due to infectious organism [J18.9] Acute ST elevation myocardial infarction (STEMI) of inferior wall (HCC) [I21.19] Sepsis without acute organ dysfunction, due to unspecified organism [facility]) [A41.9] Patient Active Problem List   Diagnosis Date Noted   Spinal epidural abscess 05/07/2023   MSSA bacteremia 05/06/2023   Non-traumatic rhabdomyolysis 05/06/2023   Severe sepsis (HCC) 05/03/2023   Traumatic rhabdomyolysis (HCC) 05/03/2023   Accidental fall from bed, initial encounter 05/03/2023   Supratherapeutic INR 05/03/2023   Elevated troponin 05/03/2023   Abnormal LFTs--possible biliary tract disease 05/03/2023   Pneumonia, possible 05/03/2023   Syncope, presumed 05/03/2023   Acute ST elevation myocardial infarction (STEMI) of inferior wall (HCC) 05/03/2023   Abnormal MRI, cervical spine 05/03/2023   Endocarditis 05/03/2023   Right shoulder pain 10/23/2022   Hyperkalemia 09/18/2022   Allergic rhinitis 05/25/2022   Atherosclerosis of native arteries of extremity with intermittent  claudication (HCC) 03/21/2022   Long term (current) use of anticoagulants 10/25/2021   Rash in adult 10/15/2021   Atrial fibrillation with rapid ventricular response (HCC) 05/29/2021   Hydrocele, left 11/01/2020   Status post laparoscopic hernia repair 09/27/2020   Anemia in chronic kidney disease 04/08/2019   Stage 3a chronic kidney disease (HCC) 04/08/2019   Hyperlipidemia  04/28/2018   Blistering of skin 01/23/2018   Leg wound, left, initial encounter 01/02/2018   Absent pedal pulses 01/02/2018   Onychomycosis 01/02/2018   Leukopenia 09/28/2017   Thrombocytopenia (HCC) 09/28/2017   B12 deficiency 09/28/2017   Renal mass 09/10/2017   Diabetes (HCC) 09/10/2017   Leg wound, right, initial encounter 09/10/2017   Acute renal failure superimposed on stage 3b chronic kidney disease (HCC) 08/14/2017   Anemia 09/12/2015   CAD S/P percutaneous coronary angioplasty    Hypertensive heart disease    CAD (coronary artery disease)    Mixed Ischemic and Non-ischemic Cardiomyopathy    Chronic HFrEF (heart failure with reduced ejection fraction) (HCC)    Bilateral lower extremity edema 02/10/2015   Abdominal pain 02/10/2015   Cough 02/10/2015   Neuropathy (HCC) 09/16/2014   Tubular adenoma of colon 07/20/2014   History of colon cancer 07/20/2014   Benign colon polyp 07/20/2014   Hypertension 07/20/2014   Hyperplastic colon polyp 07/20/2014   PCP:  Glori Luis, MD (Inactive) Pharmacy:   [facility] [phone] [place], [place] - [address] AT [facility] [address] [place] [address] Phone: [phone] Fax: [phone]     Social Drivers of Health (SDOH) Social History: SDOH Screenings   Food Insecurity: No Food Insecurity (05/04/2023)  Housing: Low Risk  (05/04/2023)  Transportation Needs: No Transportation Needs (05/04/2023)  Utilities: Not At Risk (05/04/2023)  Alcohol Screen: Low Risk  (10/17/2022)  Depression (PHQ2-9): Low Risk  (01/23/2023)  Financial Resource Strain: Low Risk  (10/17/2022)  Physical Activity: Sufficiently Active (10/17/2022)  Social Connections: Moderately Isolated (05/04/2023)  Stress: No Stress Concern Present (10/17/2022)  Tobacco Use: Low Risk  (05/04/2023)  Health Literacy: Inadequate Health Literacy (10/17/2022)   SDOH Interventions:     Readmission Risk Interventions    05/09/2023    12:59 PM  Readmission Risk Prevention Plan  PCP or Specialist Appt within 3-5 Days Complete  Social Work Consult for Recovery Care Planning/Counseling Complete  Palliative Care Screening Not Applicable

## 2023-05-09 NOTE — Progress Notes (Signed)
 Critical Lab Given to this RN, INR. Per Dr. Arville Care Hold Coumadin till INR is less than 3   [place] Eriona Kinchen,RN 05/09/2023

## 2023-05-09 NOTE — Progress Notes (Signed)
 Physical Therapy Treatment Patient Details Name: [name]. MRN: [id number] DOB: September 01, 1945 Today's Date: 05/09/2023   History of Present Illness Patient is a 78 year old male found down at home. Found to have severe sepsis, acute on chronic systolic CHF, MSSA bacteremia, atrial fibrillation s/p cardioversion, C-spine prevertebral edema    PT Comments  Patient semi reclined in bed on arrival and agreeable to PT/OT co-tx session to maximize patient's tolerance. Readjusted C-collar to appropriate fit. Able to complete bed mobility with modA. Stood from EOB with modA+2 and took steps over to recliner. Knee buckling and slight posterior lean noted in standing. Tolerated session well. Discharge plan remains appropriate.     If plan is discharge home, recommend the following: Two people to help with walking and/or transfers;A lot of help with bathing/dressing/bathroom;Assist for transportation;Help with stairs or ramp for entrance;Assistance with cooking/housework   Can travel by private vehicle     No  Equipment Recommendations  None recommended by PT    Recommendations for Other Services       Precautions / Restrictions Precautions Precautions: Fall;Cervical Precaution Booklet Issued: No Recall of Precautions/Restrictions: Impaired Restrictions Weight Bearing Restrictions Per Provider Order: No     Mobility  Bed Mobility Overal bed mobility: Needs Assistance Bed Mobility: Supine to Sit     Supine to sit: Mod assist     General bed mobility comments: assist for trunk elevation and repositioning hips towards EOB. Able to move LEs towards EOB    Transfers Overall transfer level: Needs assistance Equipment used: 2 person hand held assist Transfers: Sit to/from Stand, Bed to chair/wheelchair/BSC Sit to Stand: Mod assist, +2 physical assistance, +2 safety/equipment   Step pivot transfers: Mod assist, +2 physical assistance, +2 safety/equipment       General transfer  comment: Knee buckling noted with backwards steps. Slight posterior lean upon standing    Ambulation/Gait                   Stairs             Wheelchair Mobility     Tilt Bed    Modified Rankin (Stroke Patients Only)       Balance Overall balance assessment: Needs assistance Sitting-balance support: No upper extremity supported Sitting balance-[name]: Good     Standing balance support: Bilateral upper extremity supported, During functional activity Standing balance-[name]: Poor                              Communication    Cognition Arousal: Alert Behavior During Therapy: WFL for tasks assessed/performed   PT - Cognitive impairments: No family/caregiver present to determine baseline                         Following commands: Intact      Cueing    Exercises      General Comments        Pertinent Vitals/Pain Pain Assessment Pain Assessment: Faces Faces Pain Scale: No hurt Pain Intervention(s): Monitored during session    Home Living                          Prior Function            PT Goals (current goals can now be found in the care plan section) Acute Rehab PT Goals PT Goal Formulation: With patient  Time For Goal Achievement: 05/22/23 Potential to Achieve Goals: Fair Progress towards PT goals: Progressing toward goals    Frequency    Min 2X/week      PT Plan      Co-evaluation PT/OT/SLP Co-Evaluation/Treatment: Yes Reason for Co-Treatment: For patient/therapist safety;To address functional/ADL transfers PT goals addressed during session: Mobility/safety with mobility;Balance        AM-PAC PT "6 Clicks" Mobility   Outcome Measure  Help needed turning from your back to your side while in a flat bed without using bedrails?: Total Help needed moving from lying on your back to sitting on the side of a flat bed without using bedrails?: Total Help needed moving to and from a  bed to a chair (including a wheelchair)?: Total Help needed standing up from a chair using your arms (e.g., wheelchair or bedside chair)?: Total Help needed to walk in hospital room?: Total Help needed climbing 3-5 steps with a railing? : Total 6 Click Score: 6    End of Session Equipment Utilized During Treatment: Cervical collar Activity Tolerance: Patient tolerated treatment well Patient left: in chair;with call bell/phone within reach;with chair alarm set Nurse Communication: Mobility status PT Visit Diagnosis: Unsteadiness on feet (R26.81);Muscle weakness (generalized) (M62.81)     Time: 4098-1191 PT Time Calculation (min) (ACUTE ONLY): 24 min  Charges:    $Therapeutic Activity: 8-22 mins PT General Charges $$ ACUTE PT VISIT: 1 Visit                     [name], PT, DPT Physical Therapist - [facility]  [facility]    [name] 05/09/2023, 11:02 AM

## 2023-05-09 NOTE — Telephone Encounter (Signed)
 Dr Adriana Simas saw him as a consult on 3/29 and 4/2 while covering call for our department. Per Dr Adriana Simas: "new admit to ICU on 3/29 with sepsis and bacteremia. Has some fluid pre vertebral in cervical spine and some anterior epidural phlegmon. No focal weakness but overall debilitated, chronic numbness. He is in cervical collar as it does appear to be some disc involvement and will need follow up with xrays in future to assess stability. Exam stable today, did not recommend surgery unless exam change."

## 2023-05-09 NOTE — Telephone Encounter (Signed)
-----   Message from Susanne Borders sent at 05/06/2023  9:09 AM EDT ----- Per Dr. Adriana Simas pt needs new patient follow up in 1-2 weeks with cervical flex/ex and eval for cervical myelopathy. OK for APP clinic

## 2023-05-10 DIAGNOSIS — I5023 Acute on chronic systolic (congestive) heart failure: Secondary | ICD-10-CM | POA: Diagnosis not present

## 2023-05-10 DIAGNOSIS — R6520 Severe sepsis without septic shock: Secondary | ICD-10-CM | POA: Diagnosis not present

## 2023-05-10 DIAGNOSIS — A419 Sepsis, unspecified organism: Secondary | ICD-10-CM | POA: Diagnosis not present

## 2023-05-10 LAB — CBC WITH DIFFERENTIAL/PLATELET
Abs Immature Granulocytes: 0.04 10*3/uL (ref 0.00–0.07)
Basophils Absolute: 0 10*3/uL (ref 0.0–0.1)
Basophils Relative: 0 %
Eosinophils Absolute: 0.1 10*3/uL (ref 0.0–0.5)
Eosinophils Relative: 2 %
HCT: 35.3 % — ABNORMAL LOW (ref 39.0–52.0)
Hemoglobin: 11.6 g/dL — ABNORMAL LOW (ref 13.0–17.0)
Immature Granulocytes: 1 %
Lymphocytes Relative: 10 %
Lymphs Abs: 0.6 10*3/uL — ABNORMAL LOW (ref 0.7–4.0)
MCH: 32.6 pg (ref 26.0–34.0)
MCHC: 32.9 g/dL (ref 30.0–36.0)
MCV: 99.2 fL (ref 80.0–100.0)
Monocytes Absolute: 0.7 10*3/uL (ref 0.1–1.0)
Monocytes Relative: 12 %
Neutro Abs: 4.6 10*3/uL (ref 1.7–7.7)
Neutrophils Relative %: 75 %
Platelets: 129 10*3/uL — ABNORMAL LOW (ref 150–400)
RBC: 3.56 MIL/uL — ABNORMAL LOW (ref 4.22–5.81)
RDW: 17.3 % — ABNORMAL HIGH (ref 11.5–15.5)
WBC: 6 10*3/uL (ref 4.0–10.5)
nRBC: 0 % (ref 0.0–0.2)

## 2023-05-10 LAB — GLUCOSE, CAPILLARY
Glucose-Capillary: 147 mg/dL — ABNORMAL HIGH (ref 70–99)
Glucose-Capillary: 129 mg/dL — ABNORMAL HIGH (ref 70–99)
Glucose-Capillary: 162 mg/dL — ABNORMAL HIGH (ref 70–99)
Glucose-Capillary: 127 mg/dL — ABNORMAL HIGH (ref 70–99)

## 2023-05-10 LAB — BASIC METABOLIC PANEL WITH GFR
Anion gap: 10 (ref 5–15)
BUN: 58 mg/dL — ABNORMAL HIGH (ref 8–23)
CO2: 30 mmol/L (ref 22–32)
Calcium: 8.3 mg/dL — ABNORMAL LOW (ref 8.9–10.3)
Chloride: 99 mmol/L (ref 98–111)
Creatinine, Ser: 2.2 mg/dL — ABNORMAL HIGH (ref 0.61–1.24)
GFR, Estimated: 30 mL/min — ABNORMAL LOW (ref >60)
Glucose, Bld: 146 mg/dL — ABNORMAL HIGH (ref 70–99)
Potassium: 3.4 mmol/L — ABNORMAL LOW (ref 3.5–5.1)
Sodium: 139 mmol/L (ref 135–145)

## 2023-05-10 LAB — PROTIME-INR
INR: 4.4 (ref 0.8–1.2)
Prothrombin Time: 42.2 s — ABNORMAL HIGH (ref 11.4–15.2)

## 2023-05-10 LAB — CULTURE, BLOOD (ROUTINE X 2)
Culture: NO GROWTH
Culture: NO GROWTH

## 2023-05-10 MED ORDER — CARVEDILOL 6.25 MG PO TABS
3.1250 mg | ORAL_TABLET | Freq: Two times a day (BID) | ORAL | Status: DC
  Administered 2023-05-10 – 2023-05-14 (×4): 3.125 mg via ORAL
  Filled 2023-05-10 (×7): qty 1

## 2023-05-10 MED ORDER — POTASSIUM CHLORIDE CRYS ER 20 MEQ PO TBCR
40.0000 meq | EXTENDED_RELEASE_TABLET | Freq: Two times a day (BID) | ORAL | Status: AC
  Administered 2023-05-10 (×2): 40 meq via ORAL
  Filled 2023-05-10: qty 2
  Filled 2023-05-10: qty 4

## 2023-05-10 NOTE — Treatment Plan (Signed)
 Diagnosis: MSSA bacteremia and cervical epidural abscess Baseline Creatinine 2.20 Cr cl 28    Allergies  Allergen Reactions   Shellfish Allergy Other (See Comments) and Hives    Pt. instructed by MD to avoid seafood    OPAT Orders Discharge antibiotics: Cefazolin 2 grams IV every 12 hours Dose will have to be adjusted according to creatinine and crcl Duration: 6 weeks End Date: 06/14/23   [facility] Care Per Protocol:  Labs weekly while on IV antibiotics: X__ CBC with differential _X_ CMP _X_ CRP _X_ ESR   _X_ Please pull PIC at completion of IV antibiotics   Fax weekly lab results  promptly to [phone]  and [phone]  Clinic Follow Up Appt: 06/04/23 at 10.45AM   Call [phone] critical value or questions

## 2023-05-10 NOTE — Consult Note (Signed)
 PHARMACY - ANTICOAGULATION CONSULT NOTE  Pharmacy Consult for Warfarin Indication: atrial fibrillation  Allergies  Allergen Reactions   Shellfish Allergy Other (See Comments) and Hives    Pt. instructed by MD to avoid seafood    Patient Measurements: Height: 5\' 9"  (175.3 cm) Weight: 69.6 kg (153 lb 7 oz) IBW/kg (Calculated) : 70.7 HEPARIN DW (KG): 73.1  Vital Signs: Temp: 97.6 F (36.4 C) (04/04 0402) Temp Source: Axillary (04/04 0402) BP: 124/73 (04/04 0402) Pulse Rate: 74 (04/04 0402)  Labs: Recent Labs    05/08/23 0421 05/08/23 2019 05/09/23 0430 05/09/23 0935 05/10/23 0622  HGB 13.1  --   --  12.9* 11.6*  HCT 39.8  --   --  39.8 35.3*  PLT 121*  --   --  131* 129*  LABPROT 42.9*  --  42.9*  --   --   INR 4.5*  --  4.5*  --   --   CREATININE 2.44*  --  2.13*  --  2.20*  CKTOTAL  --  160  --   --   --     Estimated Creatinine Clearance: 28.1 mL/min (A) (by C-G formula based on SCr of 2.2 mg/dL (H)).   Medical History: Past Medical History:  Diagnosis Date   Adenocarcinoma of colon (HCC) 06/19/2013   a. moderately differentiated stage IIIc (T4bN2a M0) adenocarcinoma of colon  s/p colectomy followed by chemoRx with oxaliplatin & fluorouacil.   Anemia    CAD (coronary artery disease)    a. 02/2015 Cath: LM nl, LAD 50/40 mid/distal, LCX 80p, RCA 40p, 30d, RPL1 40, CO 3.27, CI 1.65; b. 06/09/15 PCI: LM minor irregs, m-dLAD 50%, dLAD 40%, pLCx 80% (s/p PCI/DES 0%), pRCA 40%, dRCA 30%, 1st RPLB 40%   Chronic kidney disease, stage 3a (HCC)    Chronic systolic CHF (congestive heart failure) (HCC)    a. 02/2015 Echo: EF 20-25%, diff HK, mod MR, mildy dil LA, mildly dil RV w/ mod RV syst dysfxn, mildly dil RA, mod TR, PASP ; b. 09/2017 Echo: EF 40-45%, diff HK. Gr1 DD. Mild to mod MR. Mildly to mod dil LA; c. 09/2019 Echo: EF 25-30%, glob HK, mod reduced RV fxn, sev elev PASP. Mod dil LA. Mild-mod MR; d. 05/2021 Echo: EF 20-25%, glob HK, sev red RV fxn, sev BAE, sev  MR/TR/TS, triv AI.   Diabetes mellitus without complication (HCC)    Essential hypertension    Hypertensive heart disease    Left inguinal hernia 05/2020   Mixed Ischemic and Non-ischemic Cardiomyopathy    a. 02/2015 EF 20-25%, diff HK; b. 09/2016 EF 40-45%; c. 09/2019 EF 25-30%; d. 05/2021 Echo: EF 20-25%, glob HK.   Persistent atrial fibrillation (HCC)    a. Dx 05/2021-->CHA2DS2VASc = 5-->eliquis.   Pulmonary hypertension (HCC)    Severe mitral regurgitation    a. 09/2017 Echo: Mild to mod MR; b. 09/2019 Echo: mild-mod MR; c. 05/2021 Echo: Sev MR/TR/TS.   Tubular adenoma of colon     Medications:  Per note from Anti-Coag visit on 04/24/23 patient taking Warfarin 2.5 mg po daily.  Assessment: 78 yo male presenting to ED by EMS due to being found in floor by wife.  PMH includes Afib on Warfarin, CAD, DM, HTN, CHF, and CKD.  Patient treated for sepsis likely due to pneumonia.  Pharmacy consulted to manage Warfarin.  Goal of Therapy:  INR 2-3 Monitor platelets by anticoagulation protocol: Yes    Date INR Warfarin Dose  3/28 5.6 Hold  3/29 7.2 Hold + vitamin IV 10 mg x 1  3/30 2.4 2.5 mg  3/31 2.6 2.5 mg  4/01 3.5 HOLD  4/02 4.5 HOLD  4/03 4.5 HOLD  4/04 4.4 HOLD         Plan:  INR remains supra-therapeutic after 3 days holding warfarin therapy. Vitamin K given on 3/29, NO reported s/sx of bleeding and stable CBC.  Will hold warfarin again today. Daily INR. CBC at least every 3 days.   Thomasena Vandenheuvel Rodriguez-Guzman PharmD, BCPS 05/10/2023 7:25 AM

## 2023-05-10 NOTE — Plan of Care (Signed)

## 2023-05-10 NOTE — Telephone Encounter (Signed)
 Patient still admitted.

## 2023-05-10 NOTE — Progress Notes (Signed)
 Progress Note   Patient: [name]. ZOX:[id number] DOB: 11/22/1945 DOA: 05/03/2023     7 DOS: the patient was seen and examined on 05/10/2023     Brief hospital course: From HPI: [name]. is a 78 y.o. male with medical history significant for CAD s/p stenting, chronic HFrEF secondary to NICM (EF 20 to 25% 1/17), persistent A-fib on warfarin due to cost of Eliquis, HTN, HLD, CKD 3B, and colon cancer 2015, who is being admitted with multiple acute derangements, related to a fall off his bed with no recollection of the circumstances.  Per wife at bedside, she left patient in bed this morning and when she returned home at 10 AM patient was lying on the floor on his stomach with his head turned to the side.  She states he was awake and alert.  He was unable to get up.  At baseline he does not walk with a cane or walker and typically he would have been able to get up on his own. ED course and data review: Tachycardic to 121 with soft blood pressure of 105/75. Respiratory viral panel negative for COVID flu and RSV CT abdomen and pelvis with no acute abnormalities Chest x-ray showing left lower lobe consolidation likely atelectasis, pneumonia not ruled out CT C-spine nontraumatic but concerning for prevertebral fluid with recommendations for follow-up MRI to evaluate for osteomyelitis discitis. TRH showing therefore contacted for admission "       Assessment and Plan:  Severe sepsis (HCC) Severe sepsis versus cardiogenic Possible pneumonia Possible discitis MSSA bacteremia and cervical epidural abscess MRI showed large volume prevertebral edema in the advanced cervical spine around C4-C5 Severe sepsis criteria include tachycardia, leukocytosis with lactic acidosis, AKI and hyperbilirubinemia Blood culture growing MSSA Continue cefazolin as agreed by infectious disease Patient disease recommends 6 weeks of probiotics with end date 06/14/2023 I discussed with infectious disease  today Patient underwent TEE on 05/07/2023 that did not show any evidence of endocarditis.  Patient had DCCV after TEE after TEE   Pneumonia, possible Continue current antibiotics Wean off oxygen as tolerated     Abnormal MRI, cervical spine Paravertebral C-spine fluid collection/edema --possible osteomyelitis/discitis Chronic baseline neck pain Patient was seen by currently there is no drainable fluid in the cervical area  We will continue to monitor  Plan of care discussed with neurosurgeon Neurosurgery recommends to keep cervical collar in place     Syncope, presumed Echo did not show any findings of vegetation and EF is unchanged from prior echo Continue telemetry monitoring   Abnormal LFTs--possible biliary tract disease Elevated lipase Abnormal LFTs suspect related to hepatic congestion from low output cardiac failure versus severe sepsis Patient has been seen by gastroenterologist with no new recommendations Abdominal ultrasound showing cholelithiasis with no evidence of acute cholecystitis Monitor LFTs   CAD S/P percutaneous coronary angioplasty Elevated troponin Troponin 340 but suspecting demand ischemia Cardiologist on board Continue statin therapy   Atrial fibrillation with rapid ventricular response (HCC) Cardiologist on board and case discussed Pharmacy on board with anticoagulant management in the setting of supratherapeutic INR on presentation Continue amiodarone   Acute renal failure superimposed on stage 3b chronic kidney disease (HCC) Second to severe sepsis versus low output and low flow state Monitor for improvement with above treatment Continue to monitor input and  output  Renal function peaked and now showing some improvement If renal function were to worsen we will consider nephrology consultation   Traumatic rhabdomyolysis (HCC) Accidental fall  from bed-presumed syncope Continue PT OT     Acute on chronic HFrEF (heart failure with reduced  ejection fraction) (HCC) NICM, EF 20 to 25%-declined AICD in the past Cardiologist on board and case discussed IV Lasix switched to torsemide Continue spironolactone, Farxiga as recommended by cardiologist     Supratherapeutic INR Patient received vitamin K Continue amiodarone as recommended by cardiologist Warfarin on hold on account of elevated INR on presentation   Thrombocytopenia (HCC) Monitor platelet closely   History of colon cancer Neuropathy related to chemotherapy - No acute disuse     DVT prophylaxis: SCD   Consults: Neurosurgery, dimensional radiology, cardiology   Advance Care Planning:   Code Status: Full Code    Family Communication: wife at bedside    Subjective:  Patient seen and examined at bedside this morning Patient still in cervical collar Patient has PICC line in place now with the 6 weeks of IV antibiotics I have discussed with case manager today Patient is medically ready for discharge   Physical Exam:   Constitutional:      General: He is not in acute distress.    Interventions: Cervical collar in place.  HENT:     Head: Normocephalic and atraumatic.  Cardiovascular:     Heart sounds: No murmur heard.    No systolic murmur is present.     No diastolic murmur is present.  Pulmonary:     Effort: Pulmonary effort is normal.     Breath sounds: Normal breath sounds.  Abdominal:     Palpations: Abdomen is soft.     Tenderness: There is no abdominal tenderness.  Musculoskeletal: Lateral lower extremity edema Neurological: Limited range of motion involving bilateral shoulders more pronounced on the right.  Able to move bilateral lower extremity with some generalized weakness    General: No focal deficit present.     Mental Status: Mental status is at baseline    Disposition Plan: PT OT recommends skilled nursing facility placement.      Data Reviewed:  Vitals:   05/10/23 1610 05/10/23 0748 05/10/23 0850 05/10/23 1155  BP:  (!)  127/55  119/62  Pulse:  71  76  Resp:   20   Temp:  (!) 97.5 F (36.4 C)  98.9 F (37.2 C)  TempSrc:      SpO2:  97%  99%  Weight: 69.6 kg     Height:          Latest Ref Rng & Units 05/10/2023    6:22 AM 05/09/2023    9:35 AM 05/08/2023    4:21 AM  CBC  WBC 4.0 - 10.5 K/uL 6.0  7.1  8.4   Hemoglobin 13.0 - 17.0 g/dL 96.0  45.4  09.8   Hematocrit 39.0 - 52.0 % 35.3  39.8  39.8   Platelets 150 - 400 K/uL 129  131  121        Latest Ref Rng & Units 05/10/2023    6:22 AM 05/09/2023    4:30 AM 05/08/2023    4:21 AM  BMP  Glucose 70 - 99 mg/dL 119  147  829   BUN 8 - 23 mg/dL 58  69  77   Creatinine 0.61 - 1.24 mg/dL 5.62  1.30  8.65   Sodium 135 - 145 mmol/L 139  140  137   Potassium 3.5 - 5.1 mmol/L 3.4  3.5  3.6   Chloride 98 - 111 mmol/L 99  102  101   CO2  22 - 32 mmol/L 30  28  26    Calcium 8.9 - 10.3 mg/dL 8.3  8.3  8.0      Author: Loyce Dys, MD 05/10/2023 2:49 PM  For on call review [email or web address].

## 2023-05-10 NOTE — Progress Notes (Signed)
 PHARMACY CONSULT NOTE FOR:  OUTPATIENT  PARENTERAL ANTIBIOTIC THERAPY (OPAT)  Indication: MSSA bacteremia and cervical spine abscess Regimen: Cefazolin 2gm IV q12h End date: 06/14/2023  Labs - Once weekly:  CBC/D, CMP, ESR and CRP Fax weekly lab results  promptly to [phone]  and [phone] Please pull PIC at completion of IV antibiotics Call [phone] with critical value or questions  IV antibiotic discharge orders are pended. To discharging provider:  please sign these orders via discharge navigator,  Select New Orders & click on the button choice - Manage This Unsigned Work.     Thank you for allowing pharmacy to be a part of this patient's care.  Juliette Alcide, PharmD, BCPS, BCIDP Work Cell: [phone] 05/10/2023 3:25 PM

## 2023-05-10 NOTE — TOC Progression Note (Signed)
 Transition of Care (TOC) - Progression Note    Patient Details  Name: [name]. MRN: [id number] Date of Birth: [birth year]  Transition of Care [facility]) CM/SW Contact  Truddie Hidden, RN Phone Number: 05/10/2023, 4:28 PM  Clinical Narrative:    Spoke with patient and his wife at the bedside regarding IV therapy and PT's recommendation for SNF. He is agreeable to SNF. He was provided choices for SNF in [facility] and would like [facility] as his first choice.   Bed search started.     Expected Discharge Plan: Home w Home Health Services Barriers to Discharge: Continued Medical Work up  Expected Discharge Plan and Services     Post Acute Care Choice: Home Health, Durable Medical Equipment Living arrangements for the past 2 months: Single Family Home                                       Social Determinants of Health (SDOH) Interventions SDOH Screenings   Food Insecurity: No Food Insecurity (05/04/2023)  Housing: Low Risk  (05/04/2023)  Transportation Needs: No Transportation Needs (05/04/2023)  Utilities: Not At Risk (05/04/2023)  Alcohol Screen: Low Risk  (10/17/2022)  Depression (PHQ2-9): Low Risk  (01/23/2023)  Financial Resource Strain: Low Risk  (10/17/2022)  Physical Activity: Sufficiently Active (10/17/2022)  Social Connections: Moderately Isolated (05/04/2023)  Stress: No Stress Concern Present (10/17/2022)  Tobacco Use: Low Risk  (05/04/2023)  Health Literacy: Inadequate Health Literacy (10/17/2022)    Readmission Risk Interventions    05/09/2023   12:59 PM  Readmission Risk Prevention Plan  PCP or Specialist Appt within 3-5 Days Complete  Social Work Consult for Recovery Care Planning/Counseling Complete  Palliative Care Screening Not Applicable

## 2023-05-10 NOTE — Progress Notes (Signed)
 Physical Therapy Treatment Patient Details Name: [name]. MRN: [id number] DOB: [birth year] Today's Date: 05/10/2023   History of Present Illness Patient is a 78 year old male found down at home. Found to have severe sepsis, acute on chronic systolic CHF, MSSA bacteremia, atrial fibrillation s/p cardioversion, C-spine prevertebral edema    PT Comments  Patient is agreeable to PT session. He is still requesting to go home at discharge. Patient with increased independence with bed mobility and sit to stand transfer today. He does still require +2 person assistance for step pivot transfer using rolling walker. Posterior lean with dynamic standing activity and limited standing tolerance. Unable to progress walking today. Recommend to continue PT to maximize independence. Patient will require physical assistance with mobility if going home. Rehabilitation < 3 hours/day recommended after discharge.    If plan is discharge home, recommend the following: Two people to help with walking and/or transfers;A lot of help with bathing/dressing/bathroom;Assist for transportation;Help with stairs or ramp for entrance;Assistance with cooking/housework   Can travel by private vehicle     No  Equipment Recommendations  Rolling walker (2 wheels);Wheelchair (measurements PT);Hospital bed;BSC/3in1 (if going home)    Recommendations for Other Services       Precautions / Restrictions Precautions Precautions: Fall;Cervical Precaution Booklet Issued: No Recall of Precautions/Restrictions: Impaired Required Braces or Orthoses: Cervical Brace Restrictions Weight Bearing Restrictions Per Provider Order: No     Mobility  Bed Mobility Overal bed mobility: Needs Assistance Bed Mobility: Supine to Sit Rolling: Mod assist         General bed mobility comments: assistance for trunk support and intermittently for LE support. verbal cues for technique    Transfers Overall transfer level: Needs  assistance Equipment used: Rolling walker (2 wheels) Transfers: Sit to/from Stand Sit to Stand: Mod assist   Step pivot transfers: Min assist, Mod assist, +2 physical assistance       General transfer comment: verbal cues anterior weight shifting. lifting assistance required for standing from bed with increased  independence compared to prior session. increased assistance is required for step transfer to chair with cues for technique. posterior lean with dynamic activity    Ambulation/Gait             Pre-gait activities: emphasis on upright standing posture, anterior weight shifting General Gait Details: unable to take steps away from the bed due to weakness, posterior lean, limited standing tolerance   Stairs             Wheelchair Mobility     Tilt Bed    Modified Rankin (Stroke Patients Only)       Balance Overall balance assessment: Needs assistance Sitting-balance support: Feet supported Sitting balance-[name]: Good   Postural control: Posterior lean (with standing) Standing balance support: Bilateral upper extremity supported Standing balance-[name]: Poor Standing balance comment: external support required                            Communication Communication Communication: No apparent difficulties  Cognition Arousal: Alert Behavior During Therapy: WFL for tasks assessed/performed   PT - Cognitive impairments: No family/caregiver present to determine baseline                         Following commands: Intact      Cueing Cueing Techniques: Verbal cues, Tactile cues  Exercises      General Comments General comments (skin integrity,  edema, etc.): patient feels much better sitting up in chair. encouraged patient to be out of bed for upright conditioning. of note, C-collar was changed to size medium with significantly better fit. PT frequency increased as patient's goal is to return home at discharge.       Pertinent Vitals/Pain Pain Assessment Pain Assessment: Faces Faces Pain Scale: Hurts a little bit Pain Location: neck Pain Descriptors / Indicators: Discomfort Pain Intervention(s): Limited activity within patient's tolerance, Monitored during session, Repositioned    Home Living                          Prior Function            PT Goals (current goals can now be found in the care plan section) Acute Rehab PT Goals Patient Stated Goal: to go home PT Goal Formulation: With patient Time For Goal Achievement: 05/22/23 Potential to Achieve Goals: Fair Progress towards PT goals: Progressing toward goals    Frequency    Min 3X/week      PT Plan      Co-evaluation              AM-PAC PT "6 Clicks" Mobility   Outcome Measure  Help needed turning from your back to your side while in a flat bed without using bedrails?: A Lot Help needed moving from lying on your back to sitting on the side of a flat bed without using bedrails?: A Lot Help needed moving to and from a bed to a chair (including a wheelchair)?: Total Help needed standing up from a chair using your arms (e.g., wheelchair or bedside chair)?: Total Help needed to walk in hospital room?: Total Help needed climbing 3-5 steps with a railing? : Total 6 Click Score: 8    End of Session Equipment Utilized During Treatment: Cervical collar Activity Tolerance: Patient tolerated treatment well;No increased pain Patient left: in chair;with call bell/phone within reach;with chair alarm set Nurse Communication: Mobility status PT Visit Diagnosis: Unsteadiness on feet (R26.81);Muscle weakness (generalized) (M62.81)     Time: 9147-8295 PT Time Calculation (min) (ACUTE ONLY): 19 min  Charges:    $Therapeutic Activity: 8-22 mins PT General Charges $$ ACUTE PT VISIT: 1 Visit                    Donna Bernard, PT, MPT    [name] 05/10/2023, 10:29 AM

## 2023-05-10 NOTE — Progress Notes (Signed)
 Patient ID: [name]., male   DOB: [birth year], 78 y.o.   MRN: [id number]     Advanced Heart Failure Rounding Note  Cardiologist: Lorine Bears, MD  Chief Complaint: CHF Subjective:    4/1 : TEE - no endocarditis. DC-CV - conversion to SR.   He remains in NSR today.  Weight down.    No dyspnea. Walked yesterday.   Objective:   Weight Range: 69.6 kg Body mass index is 22.66 kg/m.   Vital Signs:   Temp:  [97.5 F (36.4 C)-98.8 F (37.1 C)] 97.5 F (36.4 C) (04/04 0748) Pulse Rate:  [36-79] 71 (04/04 0748) Resp:  [13-36] 18 (04/04 0402) BP: (104-131)/(52-90) 127/55 (04/04 0748) SpO2:  [85 %-100 %] 97 % (04/04 0748) Weight:  [69.6 kg] 69.6 kg (04/04 0635) Last BM Date : 05/06/23  Weight change: Filed Weights   05/09/23 0500 05/10/23 0043 05/10/23 0635  Weight: 72.4 kg 69.6 kg 69.6 kg    Intake/Output:   Intake/Output Summary (Last 24 hours) at 05/10/2023 0825 Last data filed at 05/10/2023 0407 Gross per 24 hour  Intake 158.91 ml  Output 2200 ml  Net -2041.09 ml     Physical Exam    General: NAD Neck: No JVD, no thyromegaly or thyroid nodule.  Lungs: Clear to auscultation bilaterally with normal respiratory effort. CV: Nondisplaced PMI.  Heart regular S1/S2, no S3/S4, no murmur.  No peripheral edema.   Abdomen: Soft, nontender, no hepatosplenomegaly, no distention.  Skin: Intact without lesions or rashes.  Neurologic: Alert and oriented x 3.  Psych: Normal affect. Extremities: No clubbing or cyanosis.  HEENT: Normal.   Telemetry   SR with occasional PVCs, PACs (personally reviewed)   Labs    CBC Recent Labs    05/09/23 0935 05/10/23 0622  WBC 7.1 6.0  NEUTROABS 5.6 4.6  HGB 12.9* 11.6*  HCT 39.8 35.3*  MCV 102.1* 99.2  PLT 131* 129*   Basic Metabolic Panel Recent Labs    40/98/11 0430 05/10/23 0622  NA 140 139  K 3.5 3.4*  CL 102 99  CO2 28 30  GLUCOSE 167* 146*  BUN 69* 58*  CREATININE 2.13* 2.20*  CALCIUM 8.3* 8.3*   Liver  Function Tests Recent Labs    05/08/23 0421  AST 44*  ALT 6  ALKPHOS 96  BILITOT 1.6*  PROT 5.5*  ALBUMIN 2.1*   No results for input(s): "LIPASE", "AMYLASE" in the last 72 hours. Cardiac Enzymes Recent Labs    05/08/23 2019  CKTOTAL 160    BNP: BNP (last 3 results) Recent Labs    08/21/22 1142 12/13/22 1140 04/16/23 1541  BNP 791.1* 2,083.6* 1,510.6*    ProBNP (last 3 results) No results for input(s): "PROBNP" in the last 8760 hours.   D-Dimer No results for input(s): "DDIMER" in the last 72 hours. Hemoglobin A1C No results for input(s): "HGBA1C" in the last 72 hours.  Fasting Lipid Panel No results for input(s): "CHOL", "HDL", "LDLCALC", "TRIG", "CHOLHDL", "LDLDIRECT" in the last 72 hours. Thyroid Function Tests No results for input(s): "TSH", "T4TOTAL", "T3FREE", "THYROIDAB" in the last 72 hours.  Invalid input(s): "FREET3"  Other results:   Imaging    No results found.    Medications:     Scheduled Medications:  amiodarone  400 mg Oral BID   atorvastatin  40 mg Oral Daily   carvedilol  3.125 mg Oral BID WC   Chlorhexidine Gluconate Cloth  6 each Topical Daily   dapagliflozin propanediol  10  mg Oral Daily   feeding supplement  237 mL Oral BID BM   guaiFENesin  600 mg Oral BID   hydrocerin   Topical BID   insulin aspart  0-5 Units Subcutaneous QHS   insulin aspart  0-9 Units Subcutaneous TID WC   mupirocin ointment   Nasal BID   ondansetron (ZOFRAN) IV  4 mg Intravenous Once   mouth rinse  15 mL Mouth Rinse 4 times per day   pneumococcal 20-valent conjugate vaccine  0.5 mL Intramuscular Tomorrow-1000   potassium chloride  40 mEq Oral BID   sodium chloride flush  10-40 mL Intracatheter Q12H   spironolactone  12.5 mg Oral Daily   torsemide  40 mg Oral Daily   Warfarin - Pharmacist Dosing Inpatient   Does not apply q1600    Infusions:   ceFAZolin (ANCEF) IV 2 g (05/10/23 0100)    PRN Medications: acetaminophen **OR** acetaminophen,  albuterol, alum & mag hydroxide-simeth, HYDROcodone-acetaminophen, LORazepam, ondansetron **OR** ondansetron (ZOFRAN) IV, mouth rinse, sodium chloride flush     Assessment/Plan   1. Acute on chronic systolic CHF: With mixed cardiogenic/septic shock initially, now resolved. Has not been on pressors.  Last lactate 1.2.  Patient has history of primarily nonischemic cardiomyopathy.  He has refused ICD.  He has had trouble getting his HF medications.  Echo this admission was unchanged from past with EF 20-25%, global HK, moderate LVH, severe RV dysfunction, mild-moderate MR, moderate-severe TR, IVC dilated. He does not look volume overloaded on exam. Creatinine 2.13 => 2.2 today.  - Continue torsemide 40 mg daily.  - Continue spironolactone 12.5 daily.  - Continue Farxiga 10 daily.  - Add Coreg 3.125 mg bid.  2. MSSA bacteremia: Source is likely LLE wound (growing MSSA), also has C4/C5 prevertebral collection concerning for discitis. TEE with no endocarditis.  ID following.  - Continue cefazolin x 6 wks.  3. AKI on CKD stage 3: Baseline creatinine around 1.7.  Creatinine 2.57 => 2.4 => 2.13 => 2.2.  Suspect due to septic shock with cardiorenal component.  4. Atrial fibrillation: Persistent.  He was in atrial fibrillation at last outpatient appt but was in NSR prior to that. S/P DC-CV with conversion to SR. Remains in NSR today.  - Continue amiodarone 400 mg bid.  - Warfarin held with elevated INR.  5. Elevated CPK: Mild rhabdomyolysis due to fall, [facility] CK 2978.  6. Chronic LLE wound: Suspect source for MSSA bacteremia.  7. Elevated LFTs: Suspect shock liver, trending down.  - Continue statin. 8. H/o colon cancer 9. C-spine prevertebral edema: Upper c-spine with edema in the C4-C5 disc, ?infectious. No drainable fluid collection per IR.  - He is in a c-spine collar.  - Neurosurgery following, no plan for surgery.   He is stable for home from a cardiac standpoint.   Length of Stay: 7  Marca Ancona, MD  05/10/2023, 8:25 AM  Advanced Heart Failure Team Pager [phone] (M-F; 7a - 5p)  Please contact [facility] for night-coverage after hours (5p -7a ) and weekends on [email or web address]

## 2023-05-11 DIAGNOSIS — I4819 Other persistent atrial fibrillation: Secondary | ICD-10-CM

## 2023-05-11 DIAGNOSIS — R6520 Severe sepsis without septic shock: Secondary | ICD-10-CM | POA: Diagnosis not present

## 2023-05-11 DIAGNOSIS — I428 Other cardiomyopathies: Secondary | ICD-10-CM

## 2023-05-11 DIAGNOSIS — A419 Sepsis, unspecified organism: Secondary | ICD-10-CM | POA: Diagnosis not present

## 2023-05-11 LAB — COOXEMETRY PANEL
Carboxyhemoglobin: 1.8 % — ABNORMAL HIGH (ref 0.5–1.5)
Methemoglobin: 0.3 % (ref 0.0–1.5)
O2 Saturation: 63.5 %
Total hemoglobin: 12.4 g/dL (ref 12.0–16.0)
Total oxygen content: 63.5 %

## 2023-05-11 LAB — CBC WITH DIFFERENTIAL/PLATELET
Abs Immature Granulocytes: 0.03 10*3/uL (ref 0.00–0.07)
Abs Immature Granulocytes: 0.03 K/uL (ref 0.00–0.07)
Basophils Absolute: 0 10*3/uL (ref 0.0–0.1)
Basophils Absolute: 0 K/uL (ref 0.0–0.1)
Basophils Relative: 0 %
Basophils Relative: 0 %
Eosinophils Absolute: 0.1 10*3/uL (ref 0.0–0.5)
Eosinophils Absolute: 0.1 K/uL (ref 0.0–0.5)
Eosinophils Relative: 1 %
Eosinophils Relative: 1 %
HCT: 36.6 % — ABNORMAL LOW (ref 39.0–52.0)
HCT: 38.2 % — ABNORMAL LOW (ref 39.0–52.0)
Hemoglobin: 11.7 g/dL — ABNORMAL LOW (ref 13.0–17.0)
Hemoglobin: 12.2 g/dL — ABNORMAL LOW (ref 13.0–17.0)
Immature Granulocytes: 1 %
Immature Granulocytes: 0 %
Lymphocytes Relative: 10 %
Lymphocytes Relative: 10 %
Lymphs Abs: 0.7 10*3/uL (ref 0.7–4.0)
Lymphs Abs: 0.7 K/uL (ref 0.7–4.0)
MCH: 32.3 pg (ref 26.0–34.0)
MCH: 32.4 pg (ref 26.0–34.0)
MCHC: 32 g/dL (ref 30.0–36.0)
MCHC: 31.9 g/dL (ref 30.0–36.0)
MCV: 101.1 fL — ABNORMAL HIGH (ref 80.0–100.0)
MCV: 101.3 fL — ABNORMAL HIGH (ref 80.0–100.0)
Monocytes Absolute: 0.6 10*3/uL (ref 0.1–1.0)
Monocytes Absolute: 0.7 K/uL (ref 0.1–1.0)
Monocytes Relative: 10 %
Monocytes Relative: 9 %
Neutro Abs: 5.1 10*3/uL (ref 1.7–7.7)
Neutro Abs: 5.6 K/uL (ref 1.7–7.7)
Neutrophils Relative %: 78 %
Neutrophils Relative %: 80 %
Platelets: 136 10*3/uL — ABNORMAL LOW (ref 150–400)
Platelets: 143 K/uL — ABNORMAL LOW (ref 150–400)
RBC: 3.62 MIL/uL — ABNORMAL LOW (ref 4.22–5.81)
RBC: 3.77 MIL/uL — ABNORMAL LOW (ref 4.22–5.81)
RDW: 17.3 % — ABNORMAL HIGH (ref 11.5–15.5)
RDW: 17.1 % — ABNORMAL HIGH (ref 11.5–15.5)
WBC: 6.4 10*3/uL (ref 4.0–10.5)
WBC: 7.1 K/uL (ref 4.0–10.5)
nRBC: 0 % (ref 0.0–0.2)
nRBC: 0 % (ref 0.0–0.2)

## 2023-05-11 LAB — BASIC METABOLIC PANEL WITH GFR
Anion gap: 9 (ref 5–15)
BUN: 52 mg/dL — ABNORMAL HIGH (ref 8–23)
CO2: 29 mmol/L (ref 22–32)
Calcium: 8.6 mg/dL — ABNORMAL LOW (ref 8.9–10.3)
Chloride: 99 mmol/L (ref 98–111)
Creatinine, Ser: 2.35 mg/dL — ABNORMAL HIGH (ref 0.61–1.24)
GFR, Estimated: 28 mL/min — ABNORMAL LOW (ref >60)
Glucose, Bld: 161 mg/dL — ABNORMAL HIGH (ref 70–99)
Potassium: 4.3 mmol/L (ref 3.5–5.1)
Sodium: 137 mmol/L (ref 135–145)

## 2023-05-11 LAB — GLUCOSE, CAPILLARY
Glucose-Capillary: 108 mg/dL — ABNORMAL HIGH (ref 70–99)
Glucose-Capillary: 158 mg/dL — ABNORMAL HIGH (ref 70–99)
Glucose-Capillary: 111 mg/dL — ABNORMAL HIGH (ref 70–99)
Glucose-Capillary: 132 mg/dL — ABNORMAL HIGH (ref 70–99)
Glucose-Capillary: 126 mg/dL — ABNORMAL HIGH (ref 70–99)

## 2023-05-11 LAB — PROTIME-INR
INR: 3.7 — ABNORMAL HIGH (ref 0.8–1.2)
Prothrombin Time: 37 s — ABNORMAL HIGH (ref 11.4–15.2)

## 2023-05-11 MED ORDER — LACTULOSE 10 GM/15ML PO SOLN: 30.0000 g | Freq: Two times a day (BID) | ORAL | Status: DC | PRN

## 2023-05-11 NOTE — Progress Notes (Signed)
 Progress Note   Patient: [name]. YNW:[id number] DOB: 12-06-1945 DOA: 05/03/2023     8 DOS: the patient was seen and examined on 05/11/2023      Brief hospital course: From HPI: [name]. is a 78 y.o. male with medical history significant for CAD s/p stenting, chronic HFrEF secondary to NICM (EF 20 to 25% 1/17), persistent A-fib on warfarin due to cost of Eliquis, HTN, HLD, CKD 3B, and colon cancer 2015, who is being admitted with multiple acute derangements, related to a fall off his bed with no recollection of the circumstances.  Per wife at bedside, she left patient in bed this morning and when she returned home at 10 AM patient was lying on the floor on his stomach with his head turned to the side.  She states he was awake and alert.  He was unable to get up.  At baseline he does not walk with a cane or walker and typically he would have been able to get up on his own. ED course and data review: Tachycardic to 121 with soft blood pressure of 105/75. Respiratory viral panel negative for COVID flu and RSV CT abdomen and pelvis with no acute abnormalities Chest x-ray showing left lower lobe consolidation likely atelectasis, pneumonia not ruled out CT C-spine nontraumatic but concerning for prevertebral fluid with recommendations for follow-up MRI to evaluate for osteomyelitis discitis. TRH showing therefore contacted for admission "       Assessment and Plan:  Severe sepsis (HCC) Severe sepsis versus cardiogenic Possible pneumonia Possible discitis MSSA bacteremia and cervical epidural abscess MRI showed large volume prevertebral edema in the advanced cervical spine around C4-C5 Severe sepsis criteria include tachycardia, leukocytosis with lactic acidosis, AKI and hyperbilirubinemia Blood culture growing MSSA Continue cefazolin as agreed by infectious disease Infectious disease recommends 6 weeks of probiotics with end date 06/14/2023 Patient underwent TEE on 05/07/2023 that  did not show any evidence of endocarditis.  Patient had DCCV after TEE    Pneumonia, possible Continue current antibiotics Wean off oxygen as tolerated     Abnormal MRI, cervical spine Paravertebral C-spine fluid collection/edema --possible osteomyelitis/discitis Chronic baseline neck pain Patient was seen by currently there is no drainable fluid in the cervical area  We will continue to monitor  Plan of care discussed with neurosurgeon Neurosurgery recommends to keep cervical collar in place  Patient still in cervical collar and will be discharged on  this to follow-up with neurosurgery   Syncope, presumed Echo did not show any findings of vegetation and EF is unchanged from prior echo Continue telemetry monitoring   Abnormal LFTs--possible biliary tract disease Elevated lipase Abnormal LFTs suspect related to hepatic congestion from low output cardiac failure versus severe sepsis Patient has been seen by gastroenterologist with no new recommendations Abdominal ultrasound showing cholelithiasis with no evidence of acute cholecystitis Monitor LFTs   CAD S/P percutaneous coronary angioplasty Elevated troponin Troponin 340 but suspecting demand ischemia Cardiologist on board Continue statin therapy   Atrial fibrillation with rapid ventricular response (HCC) Cardiologist on board and case discussed Pharmacy on board with anticoagulant management in the setting of supratherapeutic INR on presentation Continue amiodarone   Acute renal failure superimposed on stage 3b chronic kidney disease (HCC) Second to severe sepsis versus low output and low flow state Monitor for improvement with above treatment Continue to monitor input and  output  Renal function peaked and now showing some improvement If renal function were to worsen we will consider  nephrology consultation   Traumatic rhabdomyolysis [facility]) Accidental fall from bed-presumed syncope Continue PT OT     Acute on  chronic HFrEF (heart failure with reduced ejection fraction) (HCC) NICM, EF 20 to 25%-declined AICD in the past Cardiologist on board and case discussed IV Lasix switched to torsemide Continue spironolactone, Marcelline Deist as recommended by cardiologist     Supratherapeutic INR Patient received vitamin K Continue amiodarone as recommended by cardiologist Warfarin on hold on account of elevated INR on presentation Pharmacy on board   Thrombocytopenia (HCC) Monitor platelet closely   History of colon cancer Neuropathy related to chemotherapy - No acute disuse     DVT prophylaxis: SCD   Consults: Neurosurgery, dimensional radiology, cardiology   Advance Care Planning:   Code Status: Full Code    Family Communication: wife at bedside     Subjective:  Patient seen and examined at bedside this morning Patient still in cervical collar and will be discharged on  this to follow-up with neurosurgery Patient has PICC line in place with plans to have 6 weeks of IV antibiotics Case manager working on placement   Physical Exam:   Constitutional:      General: He is not in acute distress.    Interventions: Cervical collar in place.  HENT:     Head: Normocephalic and atraumatic.  Cardiovascular:     Heart sounds: No murmur heard.    No systolic murmur is present.     No diastolic murmur is present.  Pulmonary:     Effort: Pulmonary effort is normal.     Breath sounds: Normal breath sounds.  Abdominal:     Palpations: Abdomen is soft.     Tenderness: There is no abdominal tenderness.  Musculoskeletal: Lateral lower extremity edema Neurological: Limited range of motion involving bilateral shoulders more pronounced on the right.  Able to move bilateral lower extremity with some generalized weakness    General: No focal deficit present.     Mental Status: Mental status is at baseline     Disposition Plan: PT OT recommends skilled nursing facility placement.  Patient is medically  cleared whenever skilled nursing facility available    Data Reviewed:    Latest Ref Rng & Units 05/11/2023   10:00 AM 05/10/2023    6:22 AM 05/09/2023    9:35 AM  CBC  WBC 4.0 - 10.5 K/uL 6.4  6.0  7.1   Hemoglobin 13.0 - 17.0 g/dL 16.1  09.6  04.5   Hematocrit 39.0 - 52.0 % 36.6  35.3  39.8   Platelets 150 - 400 K/uL 136  129  131        Latest Ref Rng & Units 05/11/2023   10:00 AM 05/10/2023    6:22 AM 05/09/2023    4:30 AM  BMP  Glucose 70 - 99 mg/dL 409  811  914   BUN 8 - 23 mg/dL 52  58  69   Creatinine 0.61 - 1.24 mg/dL 7.82  9.56  2.13   Sodium 135 - 145 mmol/L 137  139  140   Potassium 3.5 - 5.1 mmol/L 4.3  3.4  3.5   Chloride 98 - 111 mmol/L 99  99  102   CO2 22 - 32 mmol/L 29  30  28    Calcium 8.9 - 10.3 mg/dL 8.6  8.3  8.3       Vitals:   05/11/23 0511 05/11/23 0831 05/11/23 0945 05/11/23 1218  BP:  130/63 130/63 123/71  Pulse:  Marland Kitchen)  57 70 79  Resp:      Temp:  98 F (36.7 C)  (!) 97.5 F (36.4 C)  TempSrc:  Oral  Oral  SpO2:  100%  94%  Weight: 68.4 kg     Height:         Author: Loyce Dys, MD 05/11/2023 3:25 PM  For on call review [email or web address].

## 2023-05-11 NOTE — Plan of Care (Signed)

## 2023-05-11 NOTE — Progress Notes (Signed)
 Notified Dr. Meriam Sprague that patient had bowel movement and looks like rust color on linens. Concern for blood in stool. MD acknowledged and ordered CBC.

## 2023-05-11 NOTE — Progress Notes (Signed)
 PT Cancellation Note  Patient Details Name: [name]. MRN: [id number] DOB: June 01, 1945   Cancelled Treatment:     PT attempt. Pt was asleep upon arrival. He easily awakes but politely refused PT at this time. Discussed need to improve his safe functional mobility prior to returning home. " I have a wife and son who can help me at home." Pt lacks insight of current abilities/ safety concerns with Dcing home in current state. Acute PT will continue to follow and progress per current POC.    Rushie Chestnut 05/11/2023, 2:18 PM

## 2023-05-11 NOTE — Consult Note (Signed)
 PHARMACY - ANTICOAGULATION CONSULT NOTE  Pharmacy Consult for Warfarin Indication: atrial fibrillation  Allergies  Allergen Reactions   Shellfish Allergy Other (See Comments) and Hives    Pt. instructed by MD to avoid seafood    Patient Measurements: Height: 5\' 9"  (175.3 cm) Weight: 68.4 kg (150 lb 12.7 oz) IBW/kg (Calculated) : 70.7 HEPARIN DW (KG): 73.1  Vital Signs: Temp: 98 F (36.7 C) (04/05 0831) Temp Source: Oral (04/05 0831) BP: 130/63 (04/05 0945) Pulse Rate: 70 (04/05 0945)  Labs: Recent Labs    05/08/23 2019 05/09/23 0430 05/09/23 0935 05/09/23 0935 05/10/23 0622 05/11/23 1000  HGB  --   --  12.9*   < > 11.6* 11.7*  HCT  --   --  39.8  --  35.3* 36.6*  PLT  --   --  131*  --  129* 136*  LABPROT  --  42.9*  --   --  42.2* 37.0*  INR  --  4.5*  --   --  4.4* 3.7*  CREATININE  --  2.13*  --   --  2.20* 2.35*  CKTOTAL 160  --   --   --   --   --    < > = values in this interval not displayed.    Estimated Creatinine Clearance: 25.9 mL/min (A) (by C-G formula based on SCr of 2.35 mg/dL (H)).   Medical History: Past Medical History:  Diagnosis Date   Adenocarcinoma of colon (HCC) 06/19/2013   a. moderately differentiated stage IIIc (T4bN2a M0) adenocarcinoma of colon  s/p colectomy followed by chemoRx with oxaliplatin & fluorouacil.   Anemia    CAD (coronary artery disease)    a. 02/2015 Cath: LM nl, LAD 50/40 mid/distal, LCX 80p, RCA 40p, 30d, RPL1 40, CO 3.27, CI 1.65; b. 06/09/15 PCI: LM minor irregs, m-dLAD 50%, dLAD 40%, pLCx 80% (s/p PCI/DES 0%), pRCA 40%, dRCA 30%, 1st RPLB 40%   Chronic kidney disease, stage 3a (HCC)    Chronic systolic CHF (congestive heart failure) (HCC)    a. 02/2015 Echo: EF 20-25%, diff HK, mod MR, mildy dil LA, mildly dil RV w/ mod RV syst dysfxn, mildly dil RA, mod TR, PASP ; b. 09/2017 Echo: EF 40-45%, diff HK. Gr1 DD. Mild to mod MR. Mildly to mod dil LA; c. 09/2019 Echo: EF 25-30%, glob HK, mod reduced RV fxn, sev elev  PASP. Mod dil LA. Mild-mod MR; d. 05/2021 Echo: EF 20-25%, glob HK, sev red RV fxn, sev BAE, sev MR/TR/TS, triv AI.   Diabetes mellitus without complication (HCC)    Essential hypertension    Hypertensive heart disease    Left inguinal hernia 05/2020   Mixed Ischemic and Non-ischemic Cardiomyopathy    a. 02/2015 EF 20-25%, diff HK; b. 09/2016 EF 40-45%; c. 09/2019 EF 25-30%; d. 05/2021 Echo: EF 20-25%, glob HK.   Persistent atrial fibrillation (HCC)    a. Dx 05/2021-->CHA2DS2VASc = 5-->eliquis.   Pulmonary hypertension (HCC)    Severe mitral regurgitation    a. 09/2017 Echo: Mild to mod MR; b. 09/2019 Echo: mild-mod MR; c. 05/2021 Echo: Sev MR/TR/TS.   Tubular adenoma of colon     Medications:  Per note from Anti-Coag visit on 04/24/23 patient taking Warfarin 2.5 mg po daily.  Assessment: 78 yo male presenting to ED by EMS due to being found in floor by wife.  PMH includes Afib on Warfarin, CAD, DM, HTN, CHF, and CKD.  Patient treated for sepsis likely due to pneumonia.  Pharmacy consulted to manage Warfarin.  Goal of Therapy:  INR 2-3 Monitor platelets by anticoagulation protocol: Yes    Date INR Warfarin Dose  3/28 5.6 Hold  3/29 7.2 Hold + vitamin IV 10 mg x 1  3/30 2.4 2.5 mg  3/31 2.6 2.5 mg  4/01 3.5 HOLD  4/02 4.5 HOLD  4/03 4.5 HOLD  4/04 4.4 HOLD  4/5 3.7 HOLD     Plan:  INR is supratherapeutic. Will hold warfarin tonight. Daily INR. CBC at least every 3 days.   Paschal Dopp PharmD, BCPS 05/11/2023 11:34 AM

## 2023-05-11 NOTE — TOC Progression Note (Signed)
 Transition of Care (TOC) - Progression Note    Patient Details  Name: [name]. MRN: [id number] Date of Birth: 1945-03-11  Transition of Care [facility]) CM/SW Contact  Liliana Cline, LCSW Phone Number: 05/11/2023, 9:07 AM  Clinical Narrative:    CSW asked Tammy at [facility] to review the referral. Awaiting response.   Expected Discharge Plan: Home w Home Health Services Barriers to Discharge: Continued Medical Work up  Expected Discharge Plan and Services     Post Acute Care Choice: Home Health, Durable Medical Equipment Living arrangements for the past 2 months: Single Family Home                                       Social Determinants of Health (SDOH) Interventions SDOH Screenings   Food Insecurity: No Food Insecurity (05/04/2023)  Housing: Low Risk  (05/04/2023)  Transportation Needs: No Transportation Needs (05/04/2023)  Utilities: Not At Risk (05/04/2023)  Alcohol Screen: Low Risk  (10/17/2022)  Depression (PHQ2-9): Low Risk  (01/23/2023)  Financial Resource Strain: Low Risk  (10/17/2022)  Physical Activity: Sufficiently Active (10/17/2022)  Social Connections: Moderately Isolated (05/04/2023)  Stress: No Stress Concern Present (10/17/2022)  Tobacco Use: Low Risk  (05/04/2023)  Health Literacy: Inadequate Health Literacy (10/17/2022)    Readmission Risk Interventions    05/09/2023   12:59 PM  Readmission Risk Prevention Plan  PCP or Specialist Appt within 3-5 Days Complete  Social Work Consult for Recovery Care Planning/Counseling Complete  Palliative Care Screening Not Applicable

## 2023-05-11 NOTE — Progress Notes (Signed)
 Rounding Note    Patient Name: [name]. Date of Encounter: 05/11/2023  [facility] Cardiologist: Lorine Bears, MD   Subjective   Doing okay, wondering when he can go home.  Maintaining sinus rhythm.  No acute events overnight.  Inpatient Medications    Scheduled Meds:  amiodarone  400 mg Oral BID   atorvastatin  40 mg Oral Daily   carvedilol  3.125 mg Oral BID WC   Chlorhexidine Gluconate Cloth  6 each Topical Daily   dapagliflozin propanediol  10 mg Oral Daily   feeding supplement  237 mL Oral BID BM   guaiFENesin  600 mg Oral BID   hydrocerin   Topical BID   insulin aspart  0-5 Units Subcutaneous QHS   insulin aspart  0-9 Units Subcutaneous TID WC   mupirocin ointment   Nasal BID   ondansetron (ZOFRAN) IV  4 mg Intravenous Once   mouth rinse  15 mL Mouth Rinse 4 times per day   pneumococcal 20-valent conjugate vaccine  0.5 mL Intramuscular Tomorrow-1000   sodium chloride flush  10-40 mL Intracatheter Q12H   spironolactone  12.5 mg Oral Daily   torsemide  40 mg Oral Daily   Warfarin - Pharmacist Dosing Inpatient   Does not apply q1600   Continuous Infusions:   ceFAZolin (ANCEF) IV 2 g (05/11/23 0940)   PRN Meds: acetaminophen **OR** acetaminophen, albuterol, alum & mag hydroxide-simeth, HYDROcodone-acetaminophen, LORazepam, ondansetron **OR** ondansetron (ZOFRAN) IV, mouth rinse, sodium chloride flush   Vital Signs    Vitals:   05/11/23 0427 05/11/23 0511 05/11/23 0831 05/11/23 0945  BP: 123/66  130/63 130/63  Pulse: (!) 58  (!) 57 70  Resp: 18     Temp: 97.9 F (36.6 C)  98 F (36.7 C)   TempSrc: Oral  Oral   SpO2: 95%  100%   Weight:  68.4 kg    Height:        Intake/Output Summary (Last 24 hours) at 05/11/2023 1125 Last data filed at 05/11/2023 1048 Gross per 24 hour  Intake 0 ml  Output 3200 ml  Net -3200 ml      05/11/2023    5:11 AM 05/10/2023    6:35 AM 05/10/2023   12:43 AM  Last 3 Weights  Weight (lbs) 150 lb 12.7 oz 153 lb  7 oz 153 lb 7 oz  Weight (kg) 68.4 kg 69.6 kg 69.6 kg      Telemetry    Sinus rhythm, PACs, heart rate 76- Personally Reviewed  ECG     - Personally Reviewed  Physical Exam   GEN: No acute distress.   Neck: Cervical collar noted. Cardiac: RRR, no murmurs, rubs, or gallops.  Respiratory: Rhonchorous breath sounds, no wheezing. GI: Soft, nontender, non-distended  MS: No edema; No deformity. Neuro:  Nonfocal  Psych: Normal affect   Labs    High Sensitivity Troponin:   Recent Labs  Lab 05/03/23 1727 05/03/23 2112  TROPONINIHS 342* 53*     Chemistry Recent Labs  Lab 05/06/23 0424 05/07/23 0448 05/08/23 0421 05/09/23 0430 05/10/23 0622 05/11/23 1000  NA 136 138 137 140 139 137  K 3.9 3.6 3.6 3.5 3.4* 4.3  CL 101 104 101 102 99 99  CO2 24 24 26 28 30 29   GLUCOSE 209* 162* 216* 167* 146* 161*  BUN 85* 81* 77* 69* 58* 52*  CREATININE 2.57* 2.44* 2.44* 2.13* 2.20* 2.35*  CALCIUM 7.9* 8.1* 8.0* 8.3* 8.3* 8.6*  PROT  5.2* 5.9* 5.5*  --   --   --   ALBUMIN 2.5* 2.6* 2.1*  --   --   --   AST 131* 78* 44*  --   --   --   ALT 35 8 6  --   --   --   ALKPHOS 77 98 96  --   --   --   BILITOT 1.8* 2.2* 1.6*  --   --   --   GFRNONAA 25* 27* 27* 31* 30* 28*  ANIONGAP 11 10 10 10 10 9     Lipids No results for input(s): "CHOL", "TRIG", "HDL", "LABVLDL", "LDLCALC", "CHOLHDL" in the last 168 hours.  Hematology Recent Labs  Lab 05/09/23 0935 05/10/23 0622 05/11/23 1000  WBC 7.1 6.0 6.4  RBC 3.90* 3.56* 3.62*  HGB 12.9* 11.6* 11.7*  HCT 39.8 35.3* 36.6*  MCV 102.1* 99.2 101.1*  MCH 33.1 32.6 32.3  MCHC 32.4 32.9 32.0  RDW 17.5* 17.3* 17.3*  PLT 131* 129* 136*   Thyroid No results for input(s): "TSH", "FREET4" in the last 168 hours.  BNPNo results for input(s): "BNP", "PROBNP" in the last 168 hours.  DDimer No results for input(s): "DDIMER" in the last 168 hours.   Radiology    No results found.  Cardiac Studies   Transthoracic echo 04/2023 EF 20 to  25%  Patient Profile     78 y.o. male with history of nonischemic cardiomyopathy, EF 25%, CAD, persistent A-fib s/p DCCV being seen for cardiomyopathy and A-fib.  Assessment & Plan    Nonischemic cardiomyopathy EF 20 to 25% - Previously declined AICD. - Appears euvolemic - Continue torsemide 40 mg daily, Aldactone 12.5 mg daily, Farxiga 10 mg daily, Coreg 3.125 mg twice daily.  2.  Persistent A-fib s/p DC cardioversion on 4/1. - Maintaining sinus rhythm - Continue p.o. amiodarone 400 mg twice daily x 7 days, decrease to 200 mg twice daily x 7 days after, then 200 mg daily. - INR 3.7. - Warfarin per pharmacy protocol.  3.  Sepsis - Management as per primary team.  Patient can be discharged from a cardiac perspective on current medications.  Close follow-up with primary cardiologist and heart failure team as outpatient.    Signed, Debbe Odea, MD  05/11/2023, 11:25 AM

## 2023-05-12 DIAGNOSIS — I4819 Other persistent atrial fibrillation: Secondary | ICD-10-CM | POA: Diagnosis not present

## 2023-05-12 DIAGNOSIS — A419 Sepsis, unspecified organism: Secondary | ICD-10-CM | POA: Diagnosis not present

## 2023-05-12 DIAGNOSIS — B9561 Methicillin susceptible Staphylococcus aureus infection as the cause of diseases classified elsewhere: Secondary | ICD-10-CM | POA: Diagnosis not present

## 2023-05-12 DIAGNOSIS — R7881 Bacteremia: Secondary | ICD-10-CM | POA: Diagnosis not present

## 2023-05-12 DIAGNOSIS — I428 Other cardiomyopathies: Secondary | ICD-10-CM | POA: Diagnosis not present

## 2023-05-12 DIAGNOSIS — R6520 Severe sepsis without septic shock: Secondary | ICD-10-CM | POA: Diagnosis not present

## 2023-05-12 LAB — CBC WITH DIFFERENTIAL/PLATELET
Abs Immature Granulocytes: 0.03 10*3/uL (ref 0.00–0.07)
Basophils Absolute: 0 10*3/uL (ref 0.0–0.1)
Basophils Relative: 0 %
Eosinophils Absolute: 0.1 10*3/uL (ref 0.0–0.5)
Eosinophils Relative: 2 %
HCT: 39.1 % (ref 39.0–52.0)
Hemoglobin: 12.8 g/dL — ABNORMAL LOW (ref 13.0–17.0)
Immature Granulocytes: 1 %
Lymphocytes Relative: 11 %
Lymphs Abs: 0.7 10*3/uL (ref 0.7–4.0)
MCH: 32.8 pg (ref 26.0–34.0)
MCHC: 32.7 g/dL (ref 30.0–36.0)
MCV: 100.3 fL — ABNORMAL HIGH (ref 80.0–100.0)
Monocytes Absolute: 0.7 10*3/uL (ref 0.1–1.0)
Monocytes Relative: 10 %
Neutro Abs: 4.9 10*3/uL (ref 1.7–7.7)
Neutrophils Relative %: 76 %
Platelets: 152 10*3/uL (ref 150–400)
RBC: 3.9 MIL/uL — ABNORMAL LOW (ref 4.22–5.81)
RDW: 17 % — ABNORMAL HIGH (ref 11.5–15.5)
WBC: 6.5 10*3/uL (ref 4.0–10.5)
nRBC: 0 % (ref 0.0–0.2)

## 2023-05-12 LAB — GLUCOSE, CAPILLARY
Glucose-Capillary: 147 mg/dL — ABNORMAL HIGH (ref 70–99)
Glucose-Capillary: 263 mg/dL — ABNORMAL HIGH (ref 70–99)
Glucose-Capillary: 210 mg/dL — ABNORMAL HIGH (ref 70–99)
Glucose-Capillary: 202 mg/dL — ABNORMAL HIGH (ref 70–99)

## 2023-05-12 LAB — BASIC METABOLIC PANEL WITH GFR
Anion gap: 10 (ref 5–15)
BUN: 51 mg/dL — ABNORMAL HIGH (ref 8–23)
CO2: 29 mmol/L (ref 22–32)
Calcium: 8.5 mg/dL — ABNORMAL LOW (ref 8.9–10.3)
Chloride: 98 mmol/L (ref 98–111)
Creatinine, Ser: 2.34 mg/dL — ABNORMAL HIGH (ref 0.61–1.24)
GFR, Estimated: 28 mL/min — ABNORMAL LOW (ref >60)
Glucose, Bld: 152 mg/dL — ABNORMAL HIGH (ref 70–99)
Potassium: 3.9 mmol/L (ref 3.5–5.1)
Sodium: 137 mmol/L (ref 135–145)

## 2023-05-12 LAB — PROTIME-INR
INR: 4.2 (ref 0.8–1.2)
Prothrombin Time: 40.6 s — ABNORMAL HIGH (ref 11.4–15.2)

## 2023-05-12 NOTE — Consult Note (Signed)
 PHARMACY - ANTICOAGULATION CONSULT NOTE  Pharmacy Consult for Warfarin Indication: atrial fibrillation  Allergies  Allergen Reactions   Shellfish Allergy Other (See Comments) and Hives    Pt. instructed by MD to avoid seafood    Patient Measurements: Height: 5\' 9"  (175.3 cm) Weight: 66.7 kg (147 lb 0.8 oz) IBW/kg (Calculated) : 70.7 HEPARIN DW (KG): 73.1  Vital Signs: Temp: 97.8 F (36.6 C) (04/06 0806) BP: 123/63 (04/06 0806) Pulse Rate: 51 (04/06 0806)  Labs: Recent Labs    05/10/23 0622 05/11/23 1000 05/11/23 2200 05/12/23 0640 05/12/23 0651  HGB 11.6* 11.7* 12.2* 12.8*  --   HCT 35.3* 36.6* 38.2* 39.1  --   PLT 129* 136* 143* 152  --   LABPROT 42.2* 37.0*  --   --  40.6*  INR 4.4* 3.7*  --   --  4.2*  CREATININE 2.20* 2.35*  --  2.34*  --     Estimated Creatinine Clearance: 25.3 mL/min (A) (by C-G formula based on SCr of 2.34 mg/dL (H)).   Medical History: Past Medical History:  Diagnosis Date   Adenocarcinoma of colon (HCC) 06/19/2013   a. moderately differentiated stage IIIc (T4bN2a M0) adenocarcinoma of colon  s/p colectomy followed by chemoRx with oxaliplatin & fluorouacil.   Anemia    CAD (coronary artery disease)    a. 02/2015 Cath: LM nl, LAD 50/40 mid/distal, LCX 80p, RCA 40p, 30d, RPL1 40, CO 3.27, CI 1.65; b. 06/09/15 PCI: LM minor irregs, m-dLAD 50%, dLAD 40%, pLCx 80% (s/p PCI/DES 0%), pRCA 40%, dRCA 30%, 1st RPLB 40%   Chronic kidney disease, stage 3a (HCC)    Chronic systolic CHF (congestive heart failure) (HCC)    a. 02/2015 Echo: EF 20-25%, diff HK, mod MR, mildy dil LA, mildly dil RV w/ mod RV syst dysfxn, mildly dil RA, mod TR, PASP ; b. 09/2017 Echo: EF 40-45%, diff HK. Gr1 DD. Mild to mod MR. Mildly to mod dil LA; c. 09/2019 Echo: EF 25-30%, glob HK, mod reduced RV fxn, sev elev PASP. Mod dil LA. Mild-mod MR; d. 05/2021 Echo: EF 20-25%, glob HK, sev red RV fxn, sev BAE, sev MR/TR/TS, triv AI.   Diabetes mellitus without complication (HCC)     Essential hypertension    Hypertensive heart disease    Left inguinal hernia 05/2020   Mixed Ischemic and Non-ischemic Cardiomyopathy    a. 02/2015 EF 20-25%, diff HK; b. 09/2016 EF 40-45%; c. 09/2019 EF 25-30%; d. 05/2021 Echo: EF 20-25%, glob HK.   Persistent atrial fibrillation (HCC)    a. Dx 05/2021-->CHA2DS2VASc = 5-->eliquis.   Pulmonary hypertension (HCC)    Severe mitral regurgitation    a. 09/2017 Echo: Mild to mod MR; b. 09/2019 Echo: mild-mod MR; c. 05/2021 Echo: Sev MR/TR/TS.   Tubular adenoma of colon     Medications:  Per note from Anti-Coag visit on 04/24/23 patient taking Warfarin 2.5 mg po daily.  Assessment: 78 yo male presenting to ED by EMS due to being found in floor by wife.  PMH includes Afib on Warfarin, CAD, DM, HTN, CHF, and CKD.  Patient treated for sepsis likely due to pneumonia.  Pharmacy consulted to manage Warfarin.  DDI: new start amiodarone.   Goal of Therapy:  INR 2-3 Monitor platelets by anticoagulation protocol: Yes    Date INR Warfarin Dose  3/28 5.6 Hold  3/29 7.2 Hold + vitamin IV 10 mg x 1  3/30 2.4 2.5 mg  3/31 2.6 2.5 mg  4/01 3.5 HOLD  4/02 4.5 HOLD  4/03 4.5 HOLD  4/04 4.4 HOLD  4/5 3.7 HOLD  4/6 4.2 HOLD     Plan:  INR is supratherapeutic. Will hold warfarin tonight. Daily INR. CBC at least every 3 days.   Paschal Dopp PharmD, BCPS 05/12/2023 9:28 AM

## 2023-05-12 NOTE — TOC Progression Note (Addendum)
 Transition of Care (TOC) - Progression Note    Patient Details  Name: [name]. MRN: [id number] Date of Birth: [birth year]  Transition of Care [facility]) CM/SW Contact  Liliana Cline, LCSW Phone Number: 05/12/2023, 9:20 AM  Clinical Narrative:    [facility] has made a bed offer. Per MD, patient is medically ready. CSW called [employer] to start insurance auth - left a VM requesting a return call. CSW called patient's wife with update. She confirms they still want STR at [facility].   11:01- Spoke with Judeth Cornfield with [employer] and started auth for [facility] and [facility].   Expected Discharge Plan: Home w Home Health Services Barriers to Discharge: Continued Medical Work up  Expected Discharge Plan and Services     Post Acute Care Choice: Home Health, Durable Medical Equipment Living arrangements for the past 2 months: Single Family Home                                       Social Determinants of Health (SDOH) Interventions SDOH Screenings   Food Insecurity: No Food Insecurity (05/04/2023)  Housing: Low Risk  (05/04/2023)  Transportation Needs: No Transportation Needs (05/04/2023)  Utilities: Not At Risk (05/04/2023)  Alcohol Screen: Low Risk  (10/17/2022)  Depression (PHQ2-9): Low Risk  (01/23/2023)  Financial Resource Strain: Low Risk  (10/17/2022)  Physical Activity: Sufficiently Active (10/17/2022)  Social Connections: Moderately Isolated (05/04/2023)  Stress: No Stress Concern Present (10/17/2022)  Tobacco Use: Low Risk  (05/04/2023)  Health Literacy: Inadequate Health Literacy (10/17/2022)    Readmission Risk Interventions    05/09/2023   12:59 PM  Readmission Risk Prevention Plan  PCP or Specialist Appt within 3-5 Days Complete  Social Work Consult for Recovery Care Planning/Counseling Complete  Palliative Care Screening Not Applicable

## 2023-05-12 NOTE — Progress Notes (Signed)
 PROGRESS NOTE    [name]  ZOX:[id number] DOB: 1945-07-01 DOA: 05/03/2023 PCP: Glori Luis, MD (Inactive)    Assessment & Plan:   Principal Problem:   Severe sepsis [facility]) Active Problems:   Pneumonia, possible   Syncope, presumed   Abnormal MRI, cervical spine   CAD S/P percutaneous coronary angioplasty   Elevated troponin   Abnormal LFTs--possible biliary tract disease   Acute renal failure superimposed on stage 3b chronic kidney disease (HCC)   Atrial fibrillation with rapid ventricular response (HCC)   Chronic HFrEF (heart failure with reduced ejection fraction) (HCC)   Traumatic rhabdomyolysis (HCC)   Accidental fall from bed, initial encounter   Thrombocytopenia (HCC)   Supratherapeutic INR   History of colon cancer   Acute ST elevation myocardial infarction (STEMI) of inferior wall (HCC)   MSSA bacteremia   Non-traumatic rhabdomyolysis   Spinal epidural abscess   Persistent atrial fibrillation (HCC)   NICM (nonischemic cardiomyopathy) (HCC)  Assessment and Plan: Severe sepsis: see Dr. Adria Devon notes on how pt met severe sepsis. Severe sepsis resolved   Pneumonia: continue on IV cefazolin, bronchodilators & encourage incentive spirometry   MSSA bacteremia: likely secondary to cervical epidural abscess. No drainable fluid in cervical area. Continue w/ c-collar as per neuro surg.  Continue on IV cefazolin x 6 weeks (end date on 06/14/23) as per ID. TEE was neg for endocarditis.   Likely syncope: etiology unclear. Continue on tele    Transaminitis: etiology unclear, possibly secondary to severe sepsis. US showed cholelithiasis with no evidence of acute cholecystitis  Hx of CAD: s/p percutaneous coronary angioplasty. W/ elevated troponins likely secondary to demand ischemia    Likely PAF: w/ RVR. Continue on amio as per cardio. Cardio following and recs apprec    AKI on CKDIIIb: Cr is labile. Avoid nephrotoxic meds   Traumatic rhabdomyolysis: secondary  to fall from bed.    Acute on chronic systolic CHF: continue on torsemide, coreg, aldactone, farxiga as per cardio. Monitor I/Os    Supratherapeutic INR: s/p vitamin K. Warfarin is on hold. Pharmacy to dose & monitor    Thrombocytopenia: resolved    Hx of colon cancer: management per onco outpatient      DVT prophylaxis: warfarin  Code Status: full Family Communication:  Disposition Plan: waiting on SNF placement  Status is: Inpatient Remains inpatient appropriate because: medically stable. Waiting on SNF placement     Level of care: Telemetry Cardiac Consultants:  ID Cardio   Procedures:   Antimicrobials: cefazolin    Subjective: Pt c/o neck pain   Objective: Vitals:   05/11/23 1738 05/11/23 1948 05/12/23 0500 05/12/23 0806  BP:  106/64  123/63  Pulse: (!) 55 (!) 50  (!) 51  Resp:  18    Temp:  97.7 F (36.5 C)  97.8 F (36.6 C)  TempSrc:  Axillary    SpO2:  100%  100%  Weight:   66.7 kg   Height:        Intake/Output Summary (Last 24 hours) at 05/12/2023 0816 Last data filed at 05/12/2023 0710 Gross per 24 hour  Intake 0 ml  Output 2350 ml  Net -2350 ml   Filed Weights   05/10/23 0635 05/11/23 0511 05/12/23 0500  Weight: 69.6 kg 68.4 kg 66.7 kg    Examination:  General exam: Appears calm and comfortable  Respiratory system: Clear to auscultation. Respiratory effort normal. Cardiovascular system: S1 & S2 +. No rubs, gallops or clicks.  Gastrointestinal system:  Abdomen is nondistended, soft and nontender. Normal bowel sounds heard. Central nervous system: Alert and oriented. Moves all extremities  Psychiatry: Judgement and insight appears at baseline. Flat mood and affect    Data Reviewed: I have personally reviewed following labs and imaging studies  CBC: Recent Labs  Lab 05/09/23 0935 05/10/23 0622 05/11/23 1000 05/11/23 2200 05/12/23 0640  WBC 7.1 6.0 6.4 7.1 6.5  NEUTROABS 5.6 4.6 5.1 5.6 4.9  HGB 12.9* 11.6* 11.7* 12.2* 12.8*   HCT 39.8 35.3* 36.6* 38.2* 39.1  MCV 102.1* 99.2 101.1* 101.3* 100.3*  PLT 131* 129* 136* 143* 152   Basic Metabolic Panel: Recent Labs  Lab 05/08/23 0421 05/09/23 0430 05/10/23 0622 05/11/23 1000 05/12/23 0640  NA 137 140 139 137 137  K 3.6 3.5 3.4* 4.3 3.9  CL 101 102 99 99 98  CO2 26 28 30 29 29   GLUCOSE 216* 167* 146* 161* 152*  BUN 77* 69* 58* 52* 51*  CREATININE 2.44* 2.13* 2.20* 2.35* 2.34*  CALCIUM 8.0* 8.3* 8.3* 8.6* 8.5*   GFR: Estimated Creatinine Clearance: 25.3 mL/min (A) (by C-G formula based on SCr of 2.34 mg/dL (H)). Liver Function Tests: Recent Labs  Lab 05/06/23 0424 05/07/23 0448 05/08/23 0421  AST 131* 78* 44*  ALT 35 8 6  ALKPHOS 77 98 96  BILITOT 1.8* 2.2* 1.6*  PROT 5.2* 5.9* 5.5*  ALBUMIN 2.5* 2.6* 2.1*   No results for input(s): "LIPASE", "AMYLASE" in the last 168 hours. No results for input(s): "AMMONIA" in the last 168 hours. Coagulation Profile: Recent Labs  Lab 05/08/23 0421 05/09/23 0430 05/10/23 0622 05/11/23 1000 05/12/23 0651  INR 4.5* 4.5* 4.4* 3.7* 4.2*   Cardiac Enzymes: Recent Labs  Lab 05/08/23 2019  CKTOTAL 160   BNP (last 3 results) No results for input(s): "PROBNP" in the last 8760 hours. HbA1C: No results for input(s): "HGBA1C" in the last 72 hours. CBG: Recent Labs  Lab 05/11/23 1223 05/11/23 1654 05/11/23 2010 05/11/23 2128 05/12/23 0803  GLUCAP 158* 111* 132* 126* 147*   Lipid Profile: No results for input(s): "CHOL", "HDL", "LDLCALC", "TRIG", "CHOLHDL", "LDLDIRECT" in the last 72 hours. Thyroid Function Tests: No results for input(s): "TSH", "T4TOTAL", "FREET4", "T3FREE", "THYROIDAB" in the last 72 hours. Anemia Panel: No results for input(s): "VITAMINB12", "FOLATE", "FERRITIN", "TIBC", "IRON", "RETICCTPCT" in the last 72 hours. Sepsis Labs: Recent Labs  Lab 05/06/23 1610  LATICACIDVEN 1.2    Recent Results (from the past 240 hours)  Culture, blood (routine x 2)     Status: Abnormal    Collection Time: 05/03/23  5:27 PM   Specimen: BLOOD LEFT ARM  Result Value Ref Range Status   Specimen Description   Final    BLOOD LEFT ARM Performed at [facility], [address], [place], [place] [address]    Special Requests   Final    BOTTLES DRAWN AEROBIC AND ANAEROBIC Blood Culture results may not be optimal due to an inadequate volume of blood received in culture bottles Performed at [facility], [address] [place], [place] [address]    Culture  Setup Time   Final    GRAM POSITIVE COCCI IN BOTH AEROBIC AND ANAEROBIC BOTTLES CRITICAL RESULT CALLED TO, READ BACK BY AND VERIFIED WITH: JASON [name] @ 0538 05/04/23 LFD    Culture STAPHYLOCOCCUS AUREUS (A)  Final   Report Status 05/06/2023 FINAL  Final   Organism ID, Bacteria STAPHYLOCOCCUS AUREUS  Final      Susceptibility   Staphylococcus  aureus - MIC*    CIPROFLOXACIN <=0.5 SENSITIVE Sensitive     ERYTHROMYCIN <=0.25 SENSITIVE Sensitive     GENTAMICIN <=0.5 SENSITIVE Sensitive     OXACILLIN 0.5 SENSITIVE Sensitive     TETRACYCLINE <=1 SENSITIVE Sensitive     VANCOMYCIN 1 SENSITIVE Sensitive     TRIMETH/SULFA <=10 SENSITIVE Sensitive     CLINDAMYCIN <=0.25 SENSITIVE Sensitive     RIFAMPIN <=0.5 SENSITIVE Sensitive     Inducible Clindamycin NEGATIVE Sensitive     LINEZOLID 2 SENSITIVE Sensitive     * STAPHYLOCOCCUS AUREUS  Culture, blood (routine x 2)     Status: Abnormal   Collection Time: 05/03/23  5:27 PM   Specimen: BLOOD RIGHT ARM  Result Value Ref Range Status   Specimen Description   Final    BLOOD RIGHT ARM Performed at [facility], [address] [place], [place] [address]    Special Requests   Final    BOTTLES DRAWN AEROBIC AND ANAEROBIC Blood Culture results may not be optimal due to an inadequate volume of blood received in culture bottles Performed at [facility], [address] [place], [place] [address]    Culture  Setup Time   Final    GRAM  POSITIVE COCCI IN BOTH AEROBIC AND ANAEROBIC BOTTLES CRITICAL RESULT CALLED TO, READ BACK BY AND VERIFIED WITH: JASON [name] @ 0538 05/04/23 LFD    Culture (A)  Final    STAPHYLOCOCCUS AUREUS SUSCEPTIBILITIES PERFORMED ON PREVIOUS CULTURE WITHIN THE LAST 5 DAYS. Performed at [facility], [address], [place], [place] [address]    Report Status 05/06/2023 FINAL  Final  Resp panel by RT-PCR (RSV, Flu A&B, Covid) Anterior Nasal Swab     Status: None   Collection Time: 05/03/23  5:27 PM   Specimen: Anterior Nasal Swab  Result Value Ref Range Status   SARS Coronavirus 2 by RT PCR NEGATIVE NEGATIVE Final    Comment: (NOTE) SARS-CoV-2 target nucleic acids are NOT DETECTED.  The SARS-CoV-2 RNA is generally detectable in upper respiratory specimens during the acute phase of infection. The lowest concentration of SARS-CoV-2 viral copies this assay can detect is 138 copies/mL. A negative result does not preclude SARS-Cov-2 infection and should not be used as the sole basis for treatment or other patient management decisions. A negative result may occur with  improper specimen collection/handling, submission of specimen other than nasopharyngeal swab, presence of viral mutation(s) within the areas targeted by this assay, and inadequate number of viral copies(<138 copies/mL). A negative result must be combined with clinical observations, patient history, and epidemiological information. The expected result is Negative.  Fact Sheet for Patients:  BloggerCourse.com  Fact Sheet for Healthcare Providers:  SeriousBroker.it  This test is no t yet approved or cleared by the [place] FDA and  has been authorized for detection and/or diagnosis of SARS-CoV-2 by FDA under an Emergency Use Authorization (EUA). This EUA will remain  in effect (meaning this test can be used) for the duration of the COVID-19 declaration under Section  564(b)(1) of the Act, 21 U.S.C.section 360bbb-3(b)(1), unless the authorization is terminated  or revoked sooner.       Influenza A by PCR NEGATIVE NEGATIVE Final   Influenza B by PCR NEGATIVE NEGATIVE Final    Comment: (NOTE) The Xpert Xpress SARS-CoV-2/FLU/RSV plus assay is intended as an aid in the diagnosis of influenza from Nasopharyngeal swab specimens and should not be used as a sole basis for treatment. Nasal  washings and aspirates are unacceptable for Xpert Xpress SARS-CoV-2/FLU/RSV testing.  Fact Sheet for Patients: BloggerCourse.com  Fact Sheet for Healthcare Providers: SeriousBroker.it  This test is not yet approved or cleared by the [place] FDA and has been authorized for detection and/or diagnosis of SARS-CoV-2 by FDA under an Emergency Use Authorization (EUA). This EUA will remain in effect (meaning this test can be used) for the duration of the COVID-19 declaration under Section 564(b)(1) of the Act, 21 U.S.C. section 360bbb-3(b)(1), unless the authorization is terminated or revoked.     Resp Syncytial Virus by PCR NEGATIVE NEGATIVE Final    Comment: (NOTE) Fact Sheet for Patients: BloggerCourse.com  Fact Sheet for Healthcare Providers: SeriousBroker.it  This test is not yet approved or cleared by the [place] FDA and has been authorized for detection and/or diagnosis of SARS-CoV-2 by FDA under an Emergency Use Authorization (EUA). This EUA will remain in effect (meaning this test can be used) for the duration of the COVID-19 declaration under Section 564(b)(1) of the Act, 21 U.S.C. section 360bbb-3(b)(1), unless the authorization is terminated or revoked.  Performed at [facility], [address] [place], [place] [address]   Blood Culture ID Panel (Reflexed)     Status: Abnormal   Collection Time: 05/03/23  5:27 PM  Result Value  Ref Range Status   Enterococcus faecalis NOT DETECTED NOT DETECTED Final   Enterococcus Faecium NOT DETECTED NOT DETECTED Final   Listeria monocytogenes NOT DETECTED NOT DETECTED Final   Staphylococcus species DETECTED (A) NOT DETECTED Corrected    Comment: CRITICAL RESULT CALLED TO, READ BACK BY AND VERIFIED WITH: JASON [name]@ 0538 05/04/23 LFD CORRECTED ON 03/29 AT 0740: PREVIOUSLY REPORTED AS DETECTED JASON [name]@ 6045 05/04/23 LFD    Staphylococcus aureus (BCID) DETECTED (A) NOT DETECTED Final    Comment: CRITICAL RESULT CALLED TO, READ BACK BY AND VERIFIED WITH: Princella Ion [id number] 05/04/23 LFD    Staphylococcus epidermidis NOT DETECTED NOT DETECTED Final   Staphylococcus lugdunensis NOT DETECTED NOT DETECTED Final   Streptococcus species NOT DETECTED NOT DETECTED Final   Streptococcus agalactiae NOT DETECTED NOT DETECTED Final   Streptococcus pneumoniae NOT DETECTED NOT DETECTED Final   Streptococcus pyogenes NOT DETECTED NOT DETECTED Final   A.calcoaceticus-baumannii NOT DETECTED NOT DETECTED Final   Bacteroides fragilis NOT DETECTED NOT DETECTED Final   Enterobacterales NOT DETECTED NOT DETECTED Final   Enterobacter cloacae complex NOT DETECTED NOT DETECTED Final   Escherichia coli NOT DETECTED NOT DETECTED Final   Klebsiella aerogenes NOT DETECTED NOT DETECTED Final   Klebsiella oxytoca NOT DETECTED NOT DETECTED Final   Klebsiella pneumoniae NOT DETECTED NOT DETECTED Final   Proteus species NOT DETECTED NOT DETECTED Final   Salmonella species NOT DETECTED NOT DETECTED Final   Serratia marcescens NOT DETECTED NOT DETECTED Final   Haemophilus influenzae NOT DETECTED NOT DETECTED Final   Neisseria meningitidis NOT DETECTED NOT DETECTED Final   Pseudomonas aeruginosa NOT DETECTED NOT DETECTED Final   Stenotrophomonas maltophilia NOT DETECTED NOT DETECTED Final   Candida albicans NOT DETECTED NOT DETECTED Final   Candida auris NOT DETECTED NOT DETECTED Final   Candida  glabrata NOT DETECTED NOT DETECTED Final   Candida krusei NOT DETECTED NOT DETECTED Final   Candida parapsilosis NOT DETECTED NOT DETECTED Final   Candida tropicalis NOT DETECTED NOT DETECTED Final   Cryptococcus neoformans/gattii NOT DETECTED NOT DETECTED Final   Meth resistant mecA/C and MREJ NOT DETECTED NOT DETECTED Final    Comment: Performed at [facility], [address] [place], [place] [address]  MRSA Next Gen by PCR, Nasal     Status: Abnormal   Collection Time: 05/04/23  4:13 AM   Specimen: Nasal Mucosa; Nasal Swab  Result Value Ref Range Status   MRSA by PCR Next Gen DETECTED (A) NOT DETECTED Final    Comment: RESULT CALLED TO, READ BACK BY AND VERIFIED WITH: Fabian Sharp RN ICU @ [phone] 05/04/23 LFD (NOTE) The GeneXpert MRSA Assay (FDA approved for NASAL specimens only), is one component of a comprehensive MRSA colonization surveillance program. It is not intended to diagnose MRSA infection nor to guide or monitor treatment for MRSA infections. Test performance is not FDA approved in patients less than 82 years old. Performed at [facility], [address] [place], [place] [address]   Culture, blood (Routine X 2) w Reflex to ID Panel     Status: None   Collection Time: 05/05/23  2:52 AM   Specimen: BLOOD RIGHT HAND  Result Value Ref Range Status   Specimen Description BLOOD RIGHT HAND  Final   Special Requests   Final    BOTTLES DRAWN AEROBIC AND ANAEROBIC Blood Culture results may not be optimal due to an inadequate volume of blood received in culture bottles   Culture   Final    NO GROWTH 5 DAYS Performed at [facility] [address] [place], [place] [address]    Report Status 05/10/2023 FINAL  Final  Culture, blood (Routine X 2) w Reflex to ID Panel     Status: None   Collection Time: 05/05/23  3:00 AM   Specimen: BLOOD LEFT HAND  Result Value Ref Range Status   Specimen Description BLOOD LEFT HAND  Final   Special  Requests   Final    BOTTLES DRAWN AEROBIC AND ANAEROBIC Blood Culture results may not be optimal due to an inadequate volume of blood received in culture bottles   Culture   Final    NO GROWTH 5 DAYS Performed at [facility], [address], [place], [place] [address]    Report Status 05/10/2023 FINAL  Final  Aerobic Culture w Gram Stain (superficial specimen)     Status: None   Collection Time: 05/06/23  5:37 PM   Specimen: Wound  Result Value Ref Range Status   Specimen Description   Final    WOUND Performed at [facility], [address] [place], [place] [address]    Special Requests   Final    LEFT FOOT Performed at [facility], [address] [place], [place] [address]    Gram Stain   Final    RARE WBC PRESENT, PREDOMINANTLY MONONUCLEAR RARE GRAM POSITIVE COCCI IN PAIRS Performed at [facility], [address], [place], [place] [address]    Culture MODERATE STAPHYLOCOCCUS AUREUS  Final   Report Status 05/09/2023 FINAL  Final   Organism ID, Bacteria STAPHYLOCOCCUS AUREUS  Final      Susceptibility   Staphylococcus aureus - MIC*    CIPROFLOXACIN <=0.5 SENSITIVE Sensitive     ERYTHROMYCIN <=0.25 SENSITIVE Sensitive     GENTAMICIN <=0.5 SENSITIVE Sensitive     OXACILLIN 0.5 SENSITIVE Sensitive     TETRACYCLINE <=1 SENSITIVE Sensitive     VANCOMYCIN 1 SENSITIVE Sensitive     TRIMETH/SULFA <=10 SENSITIVE Sensitive     CLINDAMYCIN <=0.25 SENSITIVE Sensitive     RIFAMPIN <=0.5 SENSITIVE Sensitive     Inducible Clindamycin NEGATIVE Sensitive  LINEZOLID 2 SENSITIVE Sensitive     * MODERATE STAPHYLOCOCCUS AUREUS         Radiology Studies: No results found.      Scheduled Meds:  amiodarone  400 mg Oral BID   atorvastatin  40 mg Oral Daily   carvedilol  3.125 mg Oral BID WC   Chlorhexidine Gluconate Cloth  6 each Topical Daily   dapagliflozin propanediol  10 mg Oral Daily   feeding supplement  237 mL Oral BID BM    guaiFENesin  600 mg Oral BID   hydrocerin   Topical BID   insulin aspart  0-5 Units Subcutaneous QHS   insulin aspart  0-9 Units Subcutaneous TID WC   mupirocin ointment   Nasal BID   ondansetron (ZOFRAN) IV  4 mg Intravenous Once   mouth rinse  15 mL Mouth Rinse 4 times per day   pneumococcal 20-valent conjugate vaccine  0.5 mL Intramuscular Tomorrow-1000   sodium chloride flush  10-40 mL Intracatheter Q12H   spironolactone  12.5 mg Oral Daily   torsemide  40 mg Oral Daily   Warfarin - Pharmacist Dosing Inpatient   Does not apply q1600   Continuous Infusions:   ceFAZolin (ANCEF) IV 2 g (05/11/23 2152)     LOS: 9 days       Charise Killian, MD [facility] Pager [phone]  If 7PM-7AM, please contact night-coverage [email or web address] 05/12/2023, 8:16 AM

## 2023-05-12 NOTE — Progress Notes (Signed)
   Patient Name: [name]. Date of Encounter: 05/12/2023 [facility] Cardiologist: Lorine Bears, MD   Interval Summary  .    Patient remains in SB. He denies chest pain or SOB. INR 4.6 today.  Vital Signs .    Vitals:   05/11/23 1738 05/11/23 1948 05/12/23 0500 05/12/23 0806  BP:  106/64  123/63  Pulse: (!) 55 (!) 50  (!) 51  Resp:  18    Temp:  97.7 F (36.5 C)  97.8 F (36.6 C)  TempSrc:  Axillary    SpO2:  100%  100%  Weight:   66.7 kg   Height:        Intake/Output Summary (Last 24 hours) at 05/12/2023 0935 Last data filed at 05/12/2023 0710 Gross per 24 hour  Intake 0 ml  Output 2350 ml  Net -2350 ml      05/12/2023    5:00 AM 05/11/2023    5:11 AM 05/10/2023    6:35 AM  Last 3 Weights  Weight (lbs) 147 lb 0.8 oz 150 lb 12.7 oz 153 lb 7 oz  Weight (kg) 66.7 kg 68.4 kg 69.6 kg      Telemetry/ECG    SB HR upper 40-50s - Personally Reviewed  Physical Exam .   GEN: No acute distress.   Neck: No JVD Cardiac: RRR, no murmurs, rubs, or gallops.  Respiratory: Clear to auscultation bilaterally. GI: Soft, nontender, non-distended  MS: No edema  Assessment & Plan .    Acute on chronic systolic CHF/NICM - Mixed cardiogenic and septic shock, now resolved - Echo showed EF 20-25%, global HK, mod LVH, severe RV dysfunction, mild to mod MR - not candidate for AICD- patient refused - euvolemic on exam - torsemide 40mg  daily - spiro 12.5mg  daily - Farxiga 10mg  daily - coreg 3.125mg BID - he will continue to follow with the Advanced heart failure team  Persistent Afib s/p DC cardioversion on 4/1 - maintaining NSR - amiodarone 400mg  BID x 7 days, 200mg  daily BID for 7 days then 200mg  daily thereafter - INR goal 2-3 - INR 4.6. per pharmacy. He is on warfarin because Eliquis was cost prohibitive  Sepsis/MSSA bacteremia - per IM  For questions or updates, please contact [facility] Please consult [email or web address] for contact info under         Signed, Azell Bill David Stall, PA-C

## 2023-05-12 NOTE — Plan of Care (Signed)

## 2023-05-12 NOTE — Progress Notes (Signed)
 MD notified of critical INR 4.2

## 2023-05-13 ENCOUNTER — Other Ambulatory Visit (HOSPITAL_COMMUNITY): Payer: Self-pay

## 2023-05-13 ENCOUNTER — Encounter: Payer: Self-pay | Admitting: Hematology and Oncology

## 2023-05-13 ENCOUNTER — Telehealth (HOSPITAL_COMMUNITY): Payer: Self-pay | Admitting: Pharmacy Technician

## 2023-05-13 DIAGNOSIS — R7881 Bacteremia: Secondary | ICD-10-CM | POA: Diagnosis not present

## 2023-05-13 DIAGNOSIS — B9561 Methicillin susceptible Staphylococcus aureus infection as the cause of diseases classified elsewhere: Secondary | ICD-10-CM | POA: Diagnosis not present

## 2023-05-13 DIAGNOSIS — A419 Sepsis, unspecified organism: Secondary | ICD-10-CM | POA: Diagnosis not present

## 2023-05-13 DIAGNOSIS — R6520 Severe sepsis without septic shock: Secondary | ICD-10-CM | POA: Diagnosis not present

## 2023-05-13 LAB — GLUCOSE, CAPILLARY
Glucose-Capillary: 130 mg/dL — ABNORMAL HIGH (ref 70–99)
Glucose-Capillary: 189 mg/dL — ABNORMAL HIGH (ref 70–99)
Glucose-Capillary: 226 mg/dL — ABNORMAL HIGH (ref 70–99)
Glucose-Capillary: 163 mg/dL — ABNORMAL HIGH (ref 70–99)

## 2023-05-13 LAB — CBC
HCT: 39.1 % (ref 39.0–52.0)
Hemoglobin: 13.1 g/dL (ref 13.0–17.0)
MCH: 32.3 pg (ref 26.0–34.0)
MCHC: 33.5 g/dL (ref 30.0–36.0)
MCV: 96.5 fL (ref 80.0–100.0)
Platelets: 177 10*3/uL (ref 150–400)
RBC: 4.05 MIL/uL — ABNORMAL LOW (ref 4.22–5.81)
RDW: 16.2 % — ABNORMAL HIGH (ref 11.5–15.5)
WBC: 7.6 10*3/uL (ref 4.0–10.5)
nRBC: 0 % (ref 0.0–0.2)

## 2023-05-13 LAB — BASIC METABOLIC PANEL WITH GFR
Anion gap: 9 (ref 5–15)
BUN: 55 mg/dL — ABNORMAL HIGH (ref 8–23)
CO2: 29 mmol/L (ref 22–32)
Calcium: 8.4 mg/dL — ABNORMAL LOW (ref 8.9–10.3)
Chloride: 97 mmol/L — ABNORMAL LOW (ref 98–111)
Creatinine, Ser: 2.33 mg/dL — ABNORMAL HIGH (ref 0.61–1.24)
GFR, Estimated: 28 mL/min — ABNORMAL LOW (ref >60)
Glucose, Bld: 132 mg/dL — ABNORMAL HIGH (ref 70–99)
Potassium: 3.6 mmol/L (ref 3.5–5.1)
Sodium: 135 mmol/L (ref 135–145)

## 2023-05-13 LAB — PROTIME-INR
INR: 5 (ref 0.8–1.2)
Prothrombin Time: 46.4 s — ABNORMAL HIGH (ref 11.4–15.2)

## 2023-05-13 MED ORDER — PHYTONADIONE 5 MG PO TABS
5.0000 mg | ORAL_TABLET | Freq: Once | ORAL | Status: AC
  Administered 2023-05-13: 5 mg via ORAL
  Filled 2023-05-13: qty 1

## 2023-05-13 MED ORDER — TORSEMIDE 20 MG PO TABS: 40.0000 mg | ORAL_TABLET | ORAL | Status: DC

## 2023-05-13 MED ORDER — TORSEMIDE 20 MG PO TABS: 20.0000 mg | ORAL_TABLET | ORAL | Status: DC

## 2023-05-13 NOTE — Consult Note (Addendum)
 PHARMACY - ANTICOAGULATION CONSULT NOTE  Pharmacy Consult for Warfarin Indication: atrial fibrillation  Allergies  Allergen Reactions   Shellfish Allergy Other (See Comments) and Hives    Pt. instructed by MD to avoid seafood    Patient Measurements: Height: 5\' 9"  (175.3 cm) Weight: 65.5 kg (144 lb 6.4 oz) IBW/kg (Calculated) : 70.7 HEPARIN DW (KG): 73.1  Vital Signs: Temp: 97.9 F (36.6 C) (04/07 0807) Temp Source: Oral (04/07 0807) BP: 126/67 (04/07 0807) Pulse Rate: 56 (04/07 0807)  Labs: Recent Labs    05/11/23 1000 05/11/23 2200 05/12/23 0640 05/12/23 0651 05/13/23 0510  HGB 11.7* 12.2* 12.8*  --  13.1  HCT 36.6* 38.2* 39.1  --  39.1  PLT 136* 143* 152  --  177  LABPROT 37.0*  --   --  40.6* 46.4*  INR 3.7*  --   --  4.2* 5.0*  CREATININE 2.35*  --  2.34*  --  2.33*    Estimated Creatinine Clearance: 25 mL/min (A) (by C-G formula based on SCr of 2.33 mg/dL (H)).   Medical History: Past Medical History:  Diagnosis Date   Adenocarcinoma of colon (HCC) 06/19/2013   a. moderately differentiated stage IIIc (T4bN2a M0) adenocarcinoma of colon  s/p colectomy followed by chemoRx with oxaliplatin & fluorouacil.   Anemia    CAD (coronary artery disease)    a. 02/2015 Cath: LM nl, LAD 50/40 mid/distal, LCX 80p, RCA 40p, 30d, RPL1 40, CO 3.27, CI 1.65; b. 06/09/15 PCI: LM minor irregs, m-dLAD 50%, dLAD 40%, pLCx 80% (s/p PCI/DES 0%), pRCA 40%, dRCA 30%, 1st RPLB 40%   Chronic kidney disease, stage 3a (HCC)    Chronic systolic CHF (congestive heart failure) (HCC)    a. 02/2015 Echo: EF 20-25%, diff HK, mod MR, mildy dil LA, mildly dil RV w/ mod RV syst dysfxn, mildly dil RA, mod TR, PASP ; b. 09/2017 Echo: EF 40-45%, diff HK. Gr1 DD. Mild to mod MR. Mildly to mod dil LA; c. 09/2019 Echo: EF 25-30%, glob HK, mod reduced RV fxn, sev elev PASP. Mod dil LA. Mild-mod MR; d. 05/2021 Echo: EF 20-25%, glob HK, sev red RV fxn, sev BAE, sev MR/TR/TS, triv AI.   Diabetes mellitus  without complication (HCC)    Essential hypertension    Hypertensive heart disease    Left inguinal hernia 05/2020   Mixed Ischemic and Non-ischemic Cardiomyopathy    a. 02/2015 EF 20-25%, diff HK; b. 09/2016 EF 40-45%; c. 09/2019 EF 25-30%; d. 05/2021 Echo: EF 20-25%, glob HK.   Persistent atrial fibrillation (HCC)    a. Dx 05/2021-->CHA2DS2VASc = 5-->eliquis.   Pulmonary hypertension (HCC)    Severe mitral regurgitation    a. 09/2017 Echo: Mild to mod MR; b. 09/2019 Echo: mild-mod MR; c. 05/2021 Echo: Sev MR/TR/TS.   Tubular adenoma of colon     Medications:  Per note from Anti-Coag visit on 04/24/23 patient taking Warfarin 2.5 mg po daily.  Assessment: 78 yo male presenting to ED by EMS due to being found in floor by wife.  PMH includes Afib on Warfarin, CAD, DM, HTN, CHF, and CKD.  Patient treated for sepsis likely due to pneumonia.  Pharmacy consulted to manage Warfarin.  DDI: new start amiodarone.   Goal of Therapy:  INR 2-3 Monitor platelets by anticoagulation protocol: Yes    Date INR Warfarin Dose  3/28 5.6 Hold  3/29 7.2 Hold + vitamin IV 10 mg x 1  3/30 2.4 2.5 mg  3/31 2.6 2.5  mg  4/01 3.5 HOLD  4/02 4.5 HOLD  4/03 4.5 HOLD  4/04 4.4 HOLD  4/5 3.7 HOLD  4/6 4.2 HOLD  4/7 5 HOLD     Plan:  INR is supratherapeutic. Will hold warfarin tonight. Pharmacy consult for vitamin placed by MD. Will give vitamin 5 mg x 1 PO. Unsure from pharmacy prospective why INR could be trending up. LFTs were elevated on admission, but trended down. May need to repeat. Daily INR. CBC at least every 3 days.   Paschal Dopp PharmD, BCPS 05/13/2023 8:27 AM

## 2023-05-13 NOTE — Progress Notes (Signed)
 PROGRESS NOTE    [name]  ZOX:[id number] DOB: 04-21-1945 DOA: 05/03/2023 PCP: Glori Luis, MD (Inactive)    Assessment & Plan:   Principal Problem:   Severe sepsis [facility]) Active Problems:   Pneumonia, possible   Syncope, presumed   Abnormal MRI, cervical spine   CAD S/P percutaneous coronary angioplasty   Elevated troponin   Abnormal LFTs--possible biliary tract disease   Acute renal failure superimposed on stage 3b chronic kidney disease (HCC)   Atrial fibrillation with rapid ventricular response (HCC)   Chronic HFrEF (heart failure with reduced ejection fraction) (HCC)   Traumatic rhabdomyolysis (HCC)   Accidental fall from bed, initial encounter   Thrombocytopenia (HCC)   Supratherapeutic INR   History of colon cancer   Acute ST elevation myocardial infarction (STEMI) of inferior wall (HCC)   MSSA bacteremia   Non-traumatic rhabdomyolysis   Spinal epidural abscess   Persistent atrial fibrillation (HCC)   NICM (nonischemic cardiomyopathy) (HCC)  Assessment and Plan: Severe sepsis: see Dr. Adria Devon notes on how pt met severe sepsis. Severe sepsis resolved   Pneumonia: continue on IV cefazolin, bronchodilators & encourage incentive spirometry   MSSA bacteremia: likely secondary to cervical epidural abscess. No drainable fluid in cervical area. Continue w/ c-collar as per neuro surg.  Continue on IV cefazolin x 6 weeks (end date on 06/14/23) as per ID. TEE was neg for endocarditis.   Likely syncope: etiology unclear. Continue on tele    Transaminitis: etiology unclear, possibly secondary to severe sepsis. US showed cholelithiasis with no evidence of acute cholecystitis  Hx of CAD: s/p percutaneous coronary angioplasty. W/ elevated troponins likely secondary to demand ischemia    Likely PAF: w/ RVR. Continue on amio as per cardio. Cardio following and recs apprec    AKI on CKDIIIb: Cr is labile. Avoid nephrotoxic meds   Traumatic rhabdomyolysis: secondary  to fall from bed.    Acute on chronic systolic CHF: continue on coreg, torsemide, aldactone & farxiga as per cardio. Monitor I/Os    Supratherapeutic INR: s/p vitamin K and will give another dose of vitamin K as INR is 5.0. Warfarin is on hold. Pharmacy to dose and monitor    Thrombocytopenia: resolved    Hx of colon cancer: management per onco outpatient      DVT prophylaxis: warfarin  Code Status: full Family Communication:  Disposition Plan: waiting on SNF placement  Status is: Inpatient Remains inpatient appropriate because: medically stable. Waiting on SNF placement. Called peer to peer and was told that I would receive a call back     Level of care: Telemetry Cardiac Consultants:  ID Cardio   Procedures:   Antimicrobials: cefazolin    Subjective: Pt c/o fatigue   Objective: Vitals:   05/12/23 1932 05/12/23 2350 05/13/23 0534 05/13/23 0807  BP: 106/60 (!) 98/57 103/62 126/67  Pulse: (!) 54  61 (!) 56  Resp:   14   Temp:  97.9 F (36.6 C) 97.8 F (36.6 C) 97.9 F (36.6 C)  TempSrc:  Oral Oral Oral  SpO2: 100%  99% 97%  Weight:   65.5 kg   Height:        Intake/Output Summary (Last 24 hours) at 05/13/2023 0835 Last data filed at 05/13/2023 0534 Gross per 24 hour  Intake 100 ml  Output 1800 ml  Net -1700 ml   Filed Weights   05/11/23 0511 05/12/23 0500 05/13/23 0534  Weight: 68.4 kg 66.7 kg 65.5 kg    Examination:  General exam: appears comfortable  Respiratory system: decreased breath sounds b/l  Cardiovascular system: S1/S2+. No gallops or rubs   Gastrointestinal system: Abd is soft, NT, ND & hypoactive bowel sounds  Central nervous system: alert & oriented. Moves all extremities  Psychiatry: judgement and insight appears at baseline. Flat mood and affect    Data Reviewed: I have personally reviewed following labs and imaging studies  CBC: Recent Labs  Lab 05/09/23 0935 05/10/23 0622 05/11/23 1000 05/11/23 2200 05/12/23 0640  05/13/23 0510  WBC 7.1 6.0 6.4 7.1 6.5 7.6  NEUTROABS 5.6 4.6 5.1 5.6 4.9  --   HGB 12.9* 11.6* 11.7* 12.2* 12.8* 13.1  HCT 39.8 35.3* 36.6* 38.2* 39.1 39.1  MCV 102.1* 99.2 101.1* 101.3* 100.3* 96.5  PLT 131* 129* 136* 143* 152 177   Basic Metabolic Panel: Recent Labs  Lab 05/09/23 0430 05/10/23 0622 05/11/23 1000 05/12/23 0640 05/13/23 0510  NA 140 139 137 137 135  K 3.5 3.4* 4.3 3.9 3.6  CL 102 99 99 98 97*  CO2 28 30 29 29 29   GLUCOSE 167* 146* 161* 152* 132*  BUN 69* 58* 52* 51* 55*  CREATININE 2.13* 2.20* 2.35* 2.34* 2.33*  CALCIUM 8.3* 8.3* 8.6* 8.5* 8.4*   GFR: Estimated Creatinine Clearance: 25 mL/min (A) (by C-G formula based on SCr of 2.33 mg/dL (H)). Liver Function Tests: Recent Labs  Lab 05/07/23 0448 05/08/23 0421  AST 78* 44*  ALT 8 6  ALKPHOS 98 96  BILITOT 2.2* 1.6*  PROT 5.9* 5.5*  ALBUMIN 2.6* 2.1*   No results for input(s): "LIPASE", "AMYLASE" in the last 168 hours. No results for input(s): "AMMONIA" in the last 168 hours. Coagulation Profile: Recent Labs  Lab 05/09/23 0430 05/10/23 0622 05/11/23 1000 05/12/23 0651 05/13/23 0510  INR 4.5* 4.4* 3.7* 4.2* 5.0*   Cardiac Enzymes: Recent Labs  Lab 05/08/23 2019  CKTOTAL 160   BNP (last 3 results) No results for input(s): "PROBNP" in the last 8760 hours. HbA1C: No results for input(s): "HGBA1C" in the last 72 hours. CBG: Recent Labs  Lab 05/12/23 0803 05/12/23 1217 05/12/23 1602 05/12/23 2100 05/13/23 0804  GLUCAP 147* 263* 210* 202* 130*   Lipid Profile: No results for input(s): "CHOL", "HDL", "LDLCALC", "TRIG", "CHOLHDL", "LDLDIRECT" in the last 72 hours. Thyroid Function Tests: No results for input(s): "TSH", "T4TOTAL", "FREET4", "T3FREE", "THYROIDAB" in the last 72 hours. Anemia Panel: No results for input(s): "VITAMINB12", "FOLATE", "FERRITIN", "TIBC", "IRON", "RETICCTPCT" in the last 72 hours. Sepsis Labs: Recent Labs  Lab 05/06/23 2440  LATICACIDVEN 1.2     Recent Results (from the past 240 hours)  Culture, blood (routine x 2)     Status: Abnormal   Collection Time: 05/03/23  5:27 PM   Specimen: BLOOD LEFT ARM  Result Value Ref Range Status   Specimen Description   Final    BLOOD LEFT ARM Performed at [facility], [address] [place], [place] [address]    Special Requests   Final    BOTTLES DRAWN AEROBIC AND ANAEROBIC Blood Culture results may not be optimal due to an inadequate volume of blood received in culture bottles Performed at [facility], [address], [place], [place] [address]    Culture  Setup Time   Final    GRAM POSITIVE COCCI IN BOTH AEROBIC AND ANAEROBIC BOTTLES CRITICAL RESULT CALLED TO, READ BACK BY AND VERIFIED WITH: JASON [name] @ 0538 05/04/23 LFD    Culture STAPHYLOCOCCUS AUREUS (A)  Final  Report Status 05/06/2023 FINAL  Final   Organism ID, Bacteria STAPHYLOCOCCUS AUREUS  Final      Susceptibility   Staphylococcus aureus - MIC*    CIPROFLOXACIN <=0.5 SENSITIVE Sensitive     ERYTHROMYCIN <=0.25 SENSITIVE Sensitive     GENTAMICIN <=0.5 SENSITIVE Sensitive     OXACILLIN 0.5 SENSITIVE Sensitive     TETRACYCLINE <=1 SENSITIVE Sensitive     VANCOMYCIN 1 SENSITIVE Sensitive     TRIMETH/SULFA <=10 SENSITIVE Sensitive     CLINDAMYCIN <=0.25 SENSITIVE Sensitive     RIFAMPIN <=0.5 SENSITIVE Sensitive     Inducible Clindamycin NEGATIVE Sensitive     LINEZOLID 2 SENSITIVE Sensitive     * STAPHYLOCOCCUS AUREUS  Culture, blood (routine x 2)     Status: Abnormal   Collection Time: 05/03/23  5:27 PM   Specimen: BLOOD RIGHT ARM  Result Value Ref Range Status   Specimen Description   Final    BLOOD RIGHT ARM Performed at [facility], [address] [place], [place] [address]    Special Requests   Final    BOTTLES DRAWN AEROBIC AND ANAEROBIC Blood Culture results may not be optimal due to an inadequate volume of blood received in culture bottles Performed at [facility], [address] [place], [place] [address]    Culture  Setup Time   Final    GRAM POSITIVE COCCI IN BOTH AEROBIC AND ANAEROBIC BOTTLES CRITICAL RESULT CALLED TO, READ BACK BY AND VERIFIED WITH: JASON [name] @ 0538 05/04/23 LFD    Culture (A)  Final    STAPHYLOCOCCUS AUREUS SUSCEPTIBILITIES PERFORMED ON PREVIOUS CULTURE WITHIN THE LAST 5 DAYS. Performed at [facility], [address], [place], [place] [address]    Report Status 05/06/2023 FINAL  Final  Resp panel by RT-PCR (RSV, Flu A&B, Covid) Anterior Nasal Swab     Status: None   Collection Time: 05/03/23  5:27 PM   Specimen: Anterior Nasal Swab  Result Value Ref Range Status   SARS Coronavirus 2 by RT PCR NEGATIVE NEGATIVE Final    Comment: (NOTE) SARS-CoV-2 target nucleic acids are NOT DETECTED.  The SARS-CoV-2 RNA is generally detectable in upper respiratory specimens during the acute phase of infection. The lowest concentration of SARS-CoV-2 viral copies this assay can detect is 138 copies/mL. A negative result does not preclude SARS-Cov-2 infection and should not be used as the sole basis for treatment or other patient management decisions. A negative result may occur with  improper specimen collection/handling, submission of specimen other than nasopharyngeal swab, presence of viral mutation(s) within the areas targeted by this assay, and inadequate number of viral copies(<138 copies/mL). A negative result must be combined with clinical observations, patient history, and epidemiological information. The expected result is Negative.  Fact Sheet for Patients:  BloggerCourse.com  Fact Sheet for Healthcare Providers:  SeriousBroker.it  This test is no t yet approved or cleared by the [place] FDA and  has been authorized for detection and/or diagnosis of SARS-CoV-2 by FDA under an Emergency Use Authorization (EUA). This EUA will remain  in  effect (meaning this test can be used) for the duration of the COVID-19 declaration under Section 564(b)(1) of the Act, 21 U.S.C.section 360bbb-3(b)(1), unless the authorization is terminated  or revoked sooner.       Influenza A by PCR NEGATIVE NEGATIVE Final   Influenza B by PCR NEGATIVE NEGATIVE Final    Comment: (NOTE) The Xpert Xpress SARS-CoV-2/FLU/RSV plus assay is intended  as an aid in the diagnosis of influenza from Nasopharyngeal swab specimens and should not be used as a sole basis for treatment. Nasal washings and aspirates are unacceptable for Xpert Xpress SARS-CoV-2/FLU/RSV testing.  Fact Sheet for Patients: BloggerCourse.com  Fact Sheet for Healthcare Providers: SeriousBroker.it  This test is not yet approved or cleared by the [place] FDA and has been authorized for detection and/or diagnosis of SARS-CoV-2 by FDA under an Emergency Use Authorization (EUA). This EUA will remain in effect (meaning this test can be used) for the duration of the COVID-19 declaration under Section 564(b)(1) of the Act, 21 U.S.C. section 360bbb-3(b)(1), unless the authorization is terminated or revoked.     Resp Syncytial Virus by PCR NEGATIVE NEGATIVE Final    Comment: (NOTE) Fact Sheet for Patients: BloggerCourse.com  Fact Sheet for Healthcare Providers: SeriousBroker.it  This test is not yet approved or cleared by the [place] FDA and has been authorized for detection and/or diagnosis of SARS-CoV-2 by FDA under an Emergency Use Authorization (EUA). This EUA will remain in effect (meaning this test can be used) for the duration of the COVID-19 declaration under Section 564(b)(1) of the Act, 21 U.S.C. section 360bbb-3(b)(1), unless the authorization is terminated or revoked.  Performed at [facility], [address] [place], [place] [address]   Blood  Culture ID Panel (Reflexed)     Status: Abnormal   Collection Time: 05/03/23  5:27 PM  Result Value Ref Range Status   Enterococcus faecalis NOT DETECTED NOT DETECTED Final   Enterococcus Faecium NOT DETECTED NOT DETECTED Final   Listeria monocytogenes NOT DETECTED NOT DETECTED Final   Staphylococcus species DETECTED (A) NOT DETECTED Corrected    Comment: CRITICAL RESULT CALLED TO, READ BACK BY AND VERIFIED WITH: JASON [name]@ 0538 05/04/23 LFD CORRECTED ON 03/29 AT 0740: PREVIOUSLY REPORTED AS DETECTED JASON [name]@ 2841 05/04/23 LFD    Staphylococcus aureus (BCID) DETECTED (A) NOT DETECTED Final    Comment: CRITICAL RESULT CALLED TO, READ BACK BY AND VERIFIED WITH: Princella Ion [id number] 05/04/23 LFD    Staphylococcus epidermidis NOT DETECTED NOT DETECTED Final   Staphylococcus lugdunensis NOT DETECTED NOT DETECTED Final   Streptococcus species NOT DETECTED NOT DETECTED Final   Streptococcus agalactiae NOT DETECTED NOT DETECTED Final   Streptococcus pneumoniae NOT DETECTED NOT DETECTED Final   Streptococcus pyogenes NOT DETECTED NOT DETECTED Final   A.calcoaceticus-baumannii NOT DETECTED NOT DETECTED Final   Bacteroides fragilis NOT DETECTED NOT DETECTED Final   Enterobacterales NOT DETECTED NOT DETECTED Final   Enterobacter cloacae complex NOT DETECTED NOT DETECTED Final   Escherichia coli NOT DETECTED NOT DETECTED Final   Klebsiella aerogenes NOT DETECTED NOT DETECTED Final   Klebsiella oxytoca NOT DETECTED NOT DETECTED Final   Klebsiella pneumoniae NOT DETECTED NOT DETECTED Final   Proteus species NOT DETECTED NOT DETECTED Final   Salmonella species NOT DETECTED NOT DETECTED Final   Serratia marcescens NOT DETECTED NOT DETECTED Final   Haemophilus influenzae NOT DETECTED NOT DETECTED Final   Neisseria meningitidis NOT DETECTED NOT DETECTED Final   Pseudomonas aeruginosa NOT DETECTED NOT DETECTED Final   Stenotrophomonas maltophilia NOT DETECTED NOT DETECTED Final   Candida  albicans NOT DETECTED NOT DETECTED Final   Candida auris NOT DETECTED NOT DETECTED Final   Candida glabrata NOT DETECTED NOT DETECTED Final   Candida krusei NOT DETECTED NOT DETECTED Final   Candida parapsilosis NOT DETECTED NOT DETECTED Final   Candida tropicalis NOT DETECTED NOT DETECTED Final   Cryptococcus neoformans/gattii  NOT DETECTED NOT DETECTED Final   Meth resistant mecA/C and MREJ NOT DETECTED NOT DETECTED Final    Comment: Performed at [facility], [address] [place], [place] [address]  MRSA Next Gen by PCR, Nasal     Status: Abnormal   Collection Time: 05/04/23  4:13 AM   Specimen: Nasal Mucosa; Nasal Swab  Result Value Ref Range Status   MRSA by PCR Next Gen DETECTED (A) NOT DETECTED Final    Comment: RESULT CALLED TO, READ BACK BY AND VERIFIED WITH: Fabian Sharp RN ICU @ [phone] 05/04/23 LFD (NOTE) The GeneXpert MRSA Assay (FDA approved for NASAL specimens only), is one component of a comprehensive MRSA colonization surveillance program. It is not intended to diagnose MRSA infection nor to guide or monitor treatment for MRSA infections. Test performance is not FDA approved in patients less than 60 years old. Performed at [facility], [address] [place], [place] [address]   Culture, blood (Routine X 2) w Reflex to ID Panel     Status: None   Collection Time: 05/05/23  2:52 AM   Specimen: BLOOD RIGHT HAND  Result Value Ref Range Status   Specimen Description BLOOD RIGHT HAND  Final   Special Requests   Final    BOTTLES DRAWN AEROBIC AND ANAEROBIC Blood Culture results may not be optimal due to an inadequate volume of blood received in culture bottles   Culture   Final    NO GROWTH 5 DAYS Performed at [facility], [address] [place], [place] [address]    Report Status 05/10/2023 FINAL  Final  Culture, blood (Routine X 2) w Reflex to ID Panel     Status: None   Collection Time: 05/05/23  3:00 AM   Specimen: BLOOD LEFT  HAND  Result Value Ref Range Status   Specimen Description BLOOD LEFT HAND  Final   Special Requests   Final    BOTTLES DRAWN AEROBIC AND ANAEROBIC Blood Culture results may not be optimal due to an inadequate volume of blood received in culture bottles   Culture   Final    NO GROWTH 5 DAYS Performed at [facility], [address] [place], [place] [address]    Report Status 05/10/2023 FINAL  Final  Aerobic Culture w Gram Stain (superficial specimen)     Status: None   Collection Time: 05/06/23  5:37 PM   Specimen: Wound  Result Value Ref Range Status   Specimen Description   Final    WOUND Performed at [facility], [address], [place], [place] [address]    Special Requests   Final    LEFT FOOT Performed at [facility], [address] [place], [place] [address]    Gram Stain   Final    RARE WBC PRESENT, PREDOMINANTLY MONONUCLEAR RARE GRAM POSITIVE COCCI IN PAIRS Performed at [facility], [address], [place], [place] [address]    Culture MODERATE STAPHYLOCOCCUS AUREUS  Final   Report Status 05/09/2023 FINAL  Final   Organism ID, Bacteria STAPHYLOCOCCUS AUREUS  Final      Susceptibility   Staphylococcus aureus - MIC*    CIPROFLOXACIN <=0.5 SENSITIVE Sensitive     ERYTHROMYCIN <=0.25 SENSITIVE Sensitive     GENTAMICIN <=0.5 SENSITIVE Sensitive     OXACILLIN 0.5 SENSITIVE Sensitive     TETRACYCLINE <=1 SENSITIVE Sensitive     VANCOMYCIN 1 SENSITIVE Sensitive     TRIMETH/SULFA <=10 SENSITIVE Sensitive  CLINDAMYCIN <=0.25 SENSITIVE Sensitive     RIFAMPIN <=0.5 SENSITIVE Sensitive     Inducible Clindamycin NEGATIVE Sensitive     LINEZOLID 2 SENSITIVE Sensitive     * MODERATE STAPHYLOCOCCUS AUREUS         Radiology Studies: No results found.      Scheduled Meds:  amiodarone  400 mg Oral BID   atorvastatin  40 mg Oral Daily   carvedilol  3.125 mg Oral BID WC   Chlorhexidine Gluconate Cloth  6 each Topical Daily    dapagliflozin propanediol  10 mg Oral Daily   feeding supplement  237 mL Oral BID BM   guaiFENesin  600 mg Oral BID   hydrocerin   Topical BID   insulin aspart  0-5 Units Subcutaneous QHS   insulin aspart  0-9 Units Subcutaneous TID WC   mupirocin ointment   Nasal BID   mouth rinse  15 mL Mouth Rinse 4 times per day   pneumococcal 20-valent conjugate vaccine  0.5 mL Intramuscular Tomorrow-1000   sodium chloride flush  10-40 mL Intracatheter Q12H   spironolactone  12.5 mg Oral Daily   torsemide  40 mg Oral Daily   Warfarin - Pharmacist Dosing Inpatient   Does not apply q1600   Continuous Infusions:   ceFAZolin (ANCEF) IV 2 g (05/12/23 2137)     LOS: 10 days       Charise Killian, MD [facility] Pager [phone]  If 7PM-7AM, please contact night-coverage [email or web address] 05/13/2023, 8:35 AM

## 2023-05-13 NOTE — Progress Notes (Signed)
 Patient ID: [name]., male   DOB: 1945/09/23, 78 y.o.   MRN: [id number]     Advanced Heart Failure Rounding Note  Cardiologist: Lorine Bears, MD  Chief Complaint: CHF Subjective:    4/1 : TEE - no endocarditis. DC-CV - conversion to SR.   Remains in NSR. Feels ok. Denies CP or SOB pending d/c to SNF   Objective:   Weight Range: 65.5 kg Body mass index is 21.32 kg/m.   Vital Signs:   Temp:  [97.6 F (36.4 C)-97.9 F (36.6 C)] 97.9 F (36.6 C) (04/07 0807) Pulse Rate:  [51-61] 56 (04/07 0807) Resp:  [14] 14 (04/07 0534) BP: (98-126)/(57-69) 126/67 (04/07 0807) SpO2:  [97 %-100 %] 97 % (04/07 0807) Weight:  [65.5 kg] 65.5 kg (04/07 0534) Last BM Date : 05/12/23  Weight change: Filed Weights   05/11/23 0511 05/12/23 0500 05/13/23 0534  Weight: 68.4 kg 66.7 kg 65.5 kg    Intake/Output:   Intake/Output Summary (Last 24 hours) at 05/13/2023 0923 Last data filed at 05/13/2023 0534 Gross per 24 hour  Intake 100 ml  Output 1800 ml  Net -1700 ml     Physical Exam    General:  Lying in bed in neck collar HEENT: normal Neck:neck collar present . Cor: PMI nondisplaced. Regular rate & rhythm. No rubs, gallops or murmurs. Lungs: clear Abdomen: soft, nontender, nondistended. No hepatosplenomegaly. No bruits or masses. Good bowel sounds. Extremities: no cyanosis, clubbing, rash, edema Neuro: alert & orientedx3, cranial nerves grossly intact. moves all 4 extremities w/o difficulty. Affect pleasant   Telemetry   Sinus brady 50-60s Personally reviewed   Labs    CBC Recent Labs    05/11/23 2200 05/12/23 0640 05/13/23 0510  WBC 7.1 6.5 7.6  NEUTROABS 5.6 4.9  --   HGB 12.2* 12.8* 13.1  HCT 38.2* 39.1 39.1  MCV 101.3* 100.3* 96.5  PLT 143* 152 177   Basic Metabolic Panel Recent Labs    78/29/56 0640 05/13/23 0510  NA 137 135  K 3.9 3.6  CL 98 97*  CO2 29 29  GLUCOSE 152* 132*  BUN 51* 55*  CREATININE 2.34* 2.33*  CALCIUM 8.5* 8.4*   Liver  Function Tests No results for input(s): "AST", "ALT", "ALKPHOS", "BILITOT", "PROT", "ALBUMIN" in the last 72 hours.  No results for input(s): "LIPASE", "AMYLASE" in the last 72 hours. Cardiac Enzymes No results for input(s): "CKTOTAL", "CKMB", "CKMBINDEX", "TROPONINI" in the last 72 hours.   BNP: BNP (last 3 results) Recent Labs    08/21/22 1142 12/13/22 1140 04/16/23 1541  BNP 791.1* 2,083.6* 1,510.6*    ProBNP (last 3 results) No results for input(s): "PROBNP" in the last 8760 hours.   D-Dimer No results for input(s): "DDIMER" in the last 72 hours. Hemoglobin A1C No results for input(s): "HGBA1C" in the last 72 hours.  Fasting Lipid Panel No results for input(s): "CHOL", "HDL", "LDLCALC", "TRIG", "CHOLHDL", "LDLDIRECT" in the last 72 hours. Thyroid Function Tests No results for input(s): "TSH", "T4TOTAL", "T3FREE", "THYROIDAB" in the last 72 hours.  Invalid input(s): "FREET3"  Other results:   Imaging    No results found.    Medications:     Scheduled Medications:  amiodarone  400 mg Oral BID   atorvastatin  40 mg Oral Daily   carvedilol  3.125 mg Oral BID WC   Chlorhexidine Gluconate Cloth  6 each Topical Daily   dapagliflozin propanediol  10 mg Oral Daily   feeding supplement  237 mL  Oral BID BM   guaiFENesin  600 mg Oral BID   hydrocerin   Topical BID   insulin aspart  0-5 Units Subcutaneous QHS   insulin aspart  0-9 Units Subcutaneous TID WC   mupirocin ointment   Nasal BID   mouth rinse  15 mL Mouth Rinse 4 times per day   phytonadione  5 mg Oral Once   pneumococcal 20-valent conjugate vaccine  0.5 mL Intramuscular Tomorrow-1000   sodium chloride flush  10-40 mL Intracatheter Q12H   spironolactone  12.5 mg Oral Daily   [START ON 05/14/2023] torsemide  20 mg Oral QODAY   [START ON 05/15/2023] torsemide  40 mg Oral QODAY   Warfarin - Pharmacist Dosing Inpatient   Does not apply q1600    Infusions:   ceFAZolin (ANCEF) IV 2 g (05/12/23 2137)     PRN Medications: acetaminophen **OR** acetaminophen, albuterol, alum & mag hydroxide-simeth, HYDROcodone-acetaminophen, lactulose, LORazepam, ondansetron **OR** ondansetron (ZOFRAN) IV, mouth rinse, sodium chloride flush     Assessment/Plan   1. Acute on chronic systolic CHF: With mixed cardiogenic/septic shock initially, now resolved. Has not been on pressors.  Last lactate 1.2.  Patient has history of primarily nonischemic cardiomyopathy.  He has refused ICD.  He has had trouble getting his HF medications.  Echo this admission was unchanged from past with EF 20-25%, global HK, moderate LVH, severe RV dysfunction, mild-moderate MR, moderate-severe TR, IVC dilated. He looks dry on exam .Creatinine 2.13 => 2.2 -> 2.3 today.  - He looks dry to me. Weight continues to fall. Change torsemide to 40 every other day alternating with 20 every other day  - Continue spironolactone 12.5 daily.  - Continue Farxiga 10 daily.  - Continue Coreg 3.125 mg bid.  2. MSSA bacteremia: Source is likely LLE wound (growing MSSA), also has C4/C5 prevertebral collection concerning for discitis. TEE with no endocarditis.  ID following.  - Continue cefazolin x 6 wks.  - No change 3. AKI on CKD stage 3: Baseline creatinine around 1.7.  Creatinine 2.57 => 2.4 => 2.13 => 2.2 -> 2.3.  Suspect due to septic shock with cardiorenal component.  4. Atrial fibrillation: Persistent.  He was in atrial fibrillation at last outpatient appt but was in NSR prior to that. S/P DC-CV with conversion to SR. Remains in NSR today.  - Continue amiodarone 400 mg bid.  - Warfarin held with elevated INR.  5. Elevated CPK: Mild rhabdomyolysis due to fall, peak CK 2978.  6. Chronic LLE wound: Suspect source for MSSA bacteremia.  7. Elevated LFTs: Suspect shock liver, trending down.  - Continue statin. 8. H/o colon cancer 9. C-spine prevertebral edema: Upper c-spine with edema in the C4-C5 disc, ?infectious. No drainable fluid collection per  IR.  - He is in a c-spine collar.  - Neurosurgery following, no plan for surgery.   He is stable for d/c to SNF from a cardiac standpoint. AHF will sign off and arrange outpatient f/u   Length of Stay: 10  Arvilla Meres, MD  05/13/2023, 9:23 AM  Advanced Heart Failure Team Pager [phone] (M-F; 7a - 5p)  Please contact [facility] for night-coverage after hours (5p -7a ) and weekends on [email or web address]

## 2023-05-13 NOTE — TOC Progression Note (Signed)
 Transition of Care (TOC) - Progression Note    Patient Details  Name: [name]. MRN: [id number] Date of Birth: 10/03/1945  Transition of Care [facility]) CM/SW Contact  Margarito Liner, LCSW Phone Number: 05/13/2023, 12:19 PM  Clinical Narrative:  Insurance approved ambulance transport [phone]). They are offering a peer-to-peer review for the SNF authorization. Sent details to MD. Erenest Blank is tomorrow at noon.   Expected Discharge Plan: Home w Home Health Services Barriers to Discharge: Continued Medical Work up  Expected Discharge Plan and Services     Post Acute Care Choice: Home Health, Durable Medical Equipment Living arrangements for the past 2 months: Single Family Home                                       Social Determinants of Health (SDOH) Interventions SDOH Screenings   Food Insecurity: No Food Insecurity (05/04/2023)  Housing: Low Risk  (05/04/2023)  Transportation Needs: No Transportation Needs (05/04/2023)  Utilities: Not At Risk (05/04/2023)  Alcohol Screen: Low Risk  (10/17/2022)  Depression (PHQ2-9): Low Risk  (01/23/2023)  Financial Resource Strain: Low Risk  (10/17/2022)  Physical Activity: Sufficiently Active (10/17/2022)  Social Connections: Moderately Isolated (05/04/2023)  Stress: No Stress Concern Present (10/17/2022)  Tobacco Use: Low Risk  (05/04/2023)  Health Literacy: Inadequate Health Literacy (10/17/2022)    Readmission Risk Interventions    05/09/2023   12:59 PM  Readmission Risk Prevention Plan  PCP or Specialist Appt within 3-5 Days Complete  Social Work Consult for Recovery Care Planning/Counseling Complete  Palliative Care Screening Not Applicable

## 2023-05-13 NOTE — Telephone Encounter (Signed)

## 2023-05-13 NOTE — Progress Notes (Signed)
 Occupational Therapy Treatment Patient Details Name: [name]. MRN: [id number] DOB: 09-24-1945 Today's Date: 05/13/2023   History of present illness Patient is a 78 year old male found down at home. Found to have severe sepsis, acute on chronic systolic CHF, MSSA bacteremia, atrial fibrillation s/p cardioversion, C-spine prevertebral edema   OT comments  Pt seen for skilled co-treatment with PT. Pt is agreeable to therapeutic intervention. He is confused and lacks insight about functional deficits and therapy recommendation. Pt needing mod +2 assistance for bed mobility and a few steps to recliner chair this session. Pt with posterior bias in standing requiring assistance for upright positioning for safety. Pt seated in recliner chair with chair alarm activated. Call bell and all needed items within reach upon exiting there room.       If plan is discharge home, recommend the following:  Two people to help with walking and/or transfers;Two people to help with bathing/dressing/bathroom   Equipment Recommendations  Other (comment) (defer to next venue of care)       Precautions / Restrictions Precautions Precautions: Fall;Cervical Precaution Booklet Issued: No Recall of Precautions/Restrictions: Impaired Required Braces or Orthoses: Cervical Brace       Mobility Bed Mobility Overal bed mobility: Needs Assistance Bed Mobility: Supine to Sit     Supine to sit: Min assist          Transfers Overall transfer level: Needs assistance Equipment used: Rolling walker (2 wheels) Transfers: Sit to/from Stand Sit to Stand: Mod assist, +2 physical assistance, +2 safety/equipment     Step pivot transfers: Min assist, Mod assist, +2 physical assistance     General transfer comment: modA+2 to come into standing from low bed surface and recliner     Balance Overall balance assessment: Needs assistance Sitting-balance support: Feet supported Sitting balance-[name]:  Good   Postural control: Posterior lean Standing balance support: Bilateral upper extremity supported Standing balance-[name]: Poor Standing balance comment: external support required                           ADL either performed or assessed with clinical judgement   ADL Overall ADL's : Needs assistance/impaired                                             Vision Patient Visual Report: No change from baseline           Communication Communication Communication: No apparent difficulties   Cognition Arousal: Alert Behavior During Therapy: Flat affect Cognition: Cognition impaired                               Following commands: Intact Following commands impaired: Follows one step commands with increased time, Only follows one step commands consistently      Cueing   Cueing Techniques: Verbal cues, Tactile cues  Exercises              Pertinent Vitals/ Pain       Pain Assessment Pain Assessment: Faces Faces Pain Scale: No hurt Pain Location: neck Pain Descriptors / Indicators: Discomfort Pain Intervention(s): Monitored during session         Frequency  Min 2X/week        Progress Toward Goals  OT Goals(current goals can now be  found in the care plan section)  Progress towards OT goals: Progressing toward goals      Co-evaluation    PT/OT/SLP Co-Evaluation/Treatment: Yes Reason for Co-Treatment: For patient/therapist safety;To address functional/ADL transfers PT goals addressed during session: Balance;Mobility/safety with mobility OT goals addressed during session: ADL's and self-care      AM-PAC OT "6 Clicks" Daily Activity     Outcome Measure   Help from another person eating meals?: None Help from another person taking care of personal grooming?: A Little Help from another person toileting, which includes using toliet, bedpan, or urinal?: A Lot Help from another person bathing (including  washing, rinsing, drying)?: A Lot Help from another person to put on and taking off regular upper body clothing?: A Lot Help from another person to put on and taking off regular lower body clothing?: A Lot 6 Click Score: 15    End of Session Equipment Utilized During Treatment: Rolling walker (2 wheels)  OT Visit Diagnosis: Other abnormalities of gait and mobility (R26.89);Muscle weakness (generalized) (M62.81)   Activity Tolerance Patient limited by fatigue   Patient Left in chair;with call bell/phone within reach;with chair alarm set   Nurse Communication Mobility status        Time: 0981-1914 OT Time Calculation (min): 23 min  Charges: OT General Charges $OT Visit: 1 Visit OT Treatments $Therapeutic Activity: 8-22 mins  Jackquline Denmark, MS, OTR/L , CBIS ascom [phone]  05/13/23, 2:35 PM

## 2023-05-13 NOTE — Progress Notes (Signed)
 Physical Therapy Treatment Patient Details Name: [name]. MRN: [id number] DOB: 09-23-1945 Today's Date: 05/13/2023   History of Present Illness Patient is a 78 year old male found down at home. Found to have severe sepsis, acute on chronic systolic CHF, MSSA bacteremia, atrial fibrillation s/p cardioversion, C-spine prevertebral edema    PT Comments  Patient seen in conjunction with OT to maximize patient's tolerance to activity. Patient demonstrates impaired cognition and lacks insight into current deficits and therapy recommendation. Required modA+2 for sit to stand and steps over to recliner with RW. Has posterior bias in standing requiring assist and max cueing for upright posture. Ambulated forward/backward 6' with RW and modA+2 for RW management and preventing posterior LOB. Discharge plan remains appropriate.     If plan is discharge home, recommend the following: Two people to help with walking and/or transfers;A lot of help with bathing/dressing/bathroom;Assist for transportation;Help with stairs or ramp for entrance;Assistance with cooking/housework   Can travel by private vehicle     No  Equipment Recommendations  Rolling [name] (2 wheels);Wheelchair (measurements PT);Hospital bed;BSC/3in1 (if going home)    Recommendations for Other Services       Precautions / Restrictions Precautions Precautions: Fall;Cervical Precaution Booklet Issued: No Recall of Precautions/Restrictions: Impaired Restrictions Weight Bearing Restrictions Per Provider Order: No     Mobility  Bed Mobility Overal bed mobility: Needs Assistance Bed Mobility: Supine to Sit     Supine to sit: Min assist     General bed mobility comments: using therapist as railing    Transfers Overall transfer level: Needs assistance Equipment used: Rolling [name] (2 wheels) Transfers: Sit to/from Stand Sit to Stand: Mod assist, +2 physical assistance, +2 safety/equipment   Step pivot transfers: Min  assist, Mod assist, +2 physical assistance       General transfer comment: modA+2 to come into standing from low bed surface and recliner    Ambulation/Gait Ambulation/Gait assistance: Mod assist, +2 physical assistance, +2 safety/equipment Gait Distance (Feet): 6 Feet Assistive device: Rolling [name] (2 wheels) Gait Pattern/deviations: Step-to pattern, Decreased stride length, Narrow base of support Gait velocity: decreased     General Gait Details: assist to prevent posterior LOB due to significant posterior bias as well as Actor     Tilt Bed    Modified Rankin (Stroke Patients Only)       Balance Overall balance assessment: Needs assistance Sitting-balance support: Feet supported Sitting balance-[name]: Good   Postural control: Posterior lean Standing balance support: Bilateral upper extremity supported Standing balance-[name]: Poor                              Communication Communication Communication: No apparent difficulties  Cognition Arousal: Alert Behavior During Therapy: WFL for tasks assessed/performed   PT - Cognitive impairments: No family/caregiver present to determine baseline                       PT - Cognition Comments: poor awareness into current deficits Following commands: Intact Following commands impaired: Follows one step commands with increased time    Cueing    Exercises      General Comments        Pertinent Vitals/Pain Pain Assessment Pain Assessment: Faces Faces Pain Scale: No hurt Pain Intervention(s): Monitored during session    Home Living  Prior Function            PT Goals (current goals can now be found in the care plan section) Acute Rehab PT Goals Patient Stated Goal: to go home PT Goal Formulation: With patient Time For Goal Achievement: 05/22/23 Potential to Achieve Goals:  Fair Progress towards PT goals: Progressing toward goals    Frequency    Min 3X/week      PT Plan      Co-evaluation PT/OT/SLP Co-Evaluation/Treatment: Yes Reason for Co-Treatment: For patient/therapist safety;To address functional/ADL transfers PT goals addressed during session: Balance;Mobility/safety with mobility OT goals addressed during session: ADL's and self-care      AM-PAC PT "6 Clicks" Mobility   Outcome Measure  Help needed turning from your back to your side while in a flat bed without using bedrails?: A Lot Help needed moving from lying on your back to sitting on the side of a flat bed without using bedrails?: A Lot Help needed moving to and from a bed to a chair (including a wheelchair)?: A Lot Help needed standing up from a chair using your arms (e.g., wheelchair or bedside chair)?: Total Help needed to walk in hospital room?: Total Help needed climbing 3-5 steps with a railing? : Total 6 Click Score: 9    End of Session Equipment Utilized During Treatment: Cervical collar Activity Tolerance: Patient tolerated treatment well;No increased pain Patient left: in chair;with call bell/phone within reach;with chair alarm set Nurse Communication: Mobility status PT Visit Diagnosis: Unsteadiness on feet (R26.81);Muscle weakness (generalized) (M62.81)     Time: 6962-9528 PT Time Calculation (min) (ACUTE ONLY): 23 min  Charges:    $Therapeutic Activity: 8-22 mins PT General Charges $$ ACUTE PT VISIT: 1 Visit                     [name], PT, DPT Physical Therapist - [facility]  [facility]    [name] 05/13/2023, 3:27 PM

## 2023-05-14 ENCOUNTER — Other Ambulatory Visit: Payer: Self-pay

## 2023-05-14 ENCOUNTER — Inpatient Hospital Stay

## 2023-05-14 ENCOUNTER — Emergency Department

## 2023-05-14 ENCOUNTER — Inpatient Hospital Stay (HOSPITAL_COMMUNITY)
Admission: EM | Admit: 2023-05-14 | Discharge: 2023-06-06 | Disposition: E | Source: Home / Self Care | Attending: Internal Medicine | Admitting: Internal Medicine

## 2023-05-14 ENCOUNTER — Encounter: Payer: Self-pay | Admitting: Internal Medicine

## 2023-05-14 ENCOUNTER — Ambulatory Visit: Admitting: Physician Assistant

## 2023-05-14 DIAGNOSIS — I428 Other cardiomyopathies: Secondary | ICD-10-CM | POA: Diagnosis present

## 2023-05-14 DIAGNOSIS — R7881 Bacteremia: Secondary | ICD-10-CM | POA: Diagnosis not present

## 2023-05-14 DIAGNOSIS — I469 Cardiac arrest, cause unspecified: Principal | ICD-10-CM | POA: Diagnosis present

## 2023-05-14 DIAGNOSIS — I5084 End stage heart failure: Secondary | ICD-10-CM | POA: Diagnosis present

## 2023-05-14 DIAGNOSIS — E11621 Type 2 diabetes mellitus with foot ulcer: Secondary | ICD-10-CM | POA: Diagnosis present

## 2023-05-14 DIAGNOSIS — J189 Pneumonia, unspecified organism: Secondary | ICD-10-CM | POA: Diagnosis present

## 2023-05-14 DIAGNOSIS — J9601 Acute respiratory failure with hypoxia: Secondary | ICD-10-CM | POA: Diagnosis present

## 2023-05-14 DIAGNOSIS — A419 Sepsis, unspecified organism: Secondary | ICD-10-CM | POA: Diagnosis not present

## 2023-05-14 DIAGNOSIS — I480 Paroxysmal atrial fibrillation: Secondary | ICD-10-CM | POA: Diagnosis not present

## 2023-05-14 DIAGNOSIS — I130 Hypertensive heart and chronic kidney disease with heart failure and stage 1 through stage 4 chronic kidney disease, or unspecified chronic kidney disease: Secondary | ICD-10-CM | POA: Diagnosis present

## 2023-05-14 DIAGNOSIS — I462 Cardiac arrest due to underlying cardiac condition: Secondary | ICD-10-CM | POA: Diagnosis present

## 2023-05-14 DIAGNOSIS — I081 Rheumatic disorders of both mitral and tricuspid valves: Secondary | ICD-10-CM | POA: Diagnosis present

## 2023-05-14 DIAGNOSIS — E878 Other disorders of electrolyte and fluid balance, not elsewhere classified: Secondary | ICD-10-CM | POA: Diagnosis present

## 2023-05-14 DIAGNOSIS — R791 Abnormal coagulation profile: Secondary | ICD-10-CM | POA: Diagnosis present

## 2023-05-14 DIAGNOSIS — Z7984 Long term (current) use of oral hypoglycemic drugs: Secondary | ICD-10-CM

## 2023-05-14 DIAGNOSIS — I252 Old myocardial infarction: Secondary | ICD-10-CM

## 2023-05-14 DIAGNOSIS — Z809 Family history of malignant neoplasm, unspecified: Secondary | ICD-10-CM

## 2023-05-14 DIAGNOSIS — E1122 Type 2 diabetes mellitus with diabetic chronic kidney disease: Secondary | ICD-10-CM | POA: Diagnosis present

## 2023-05-14 DIAGNOSIS — Z7901 Long term (current) use of anticoagulants: Secondary | ICD-10-CM

## 2023-05-14 DIAGNOSIS — Z9049 Acquired absence of other specified parts of digestive tract: Secondary | ICD-10-CM

## 2023-05-14 DIAGNOSIS — D631 Anemia in chronic kidney disease: Secondary | ICD-10-CM | POA: Diagnosis present

## 2023-05-14 DIAGNOSIS — A4101 Sepsis due to Methicillin susceptible Staphylococcus aureus: Principal | ICD-10-CM | POA: Diagnosis present

## 2023-05-14 DIAGNOSIS — N1832 Chronic kidney disease, stage 3b: Secondary | ICD-10-CM

## 2023-05-14 DIAGNOSIS — I2510 Atherosclerotic heart disease of native coronary artery without angina pectoris: Secondary | ICD-10-CM | POA: Diagnosis present

## 2023-05-14 DIAGNOSIS — Z79899 Other long term (current) drug therapy: Secondary | ICD-10-CM

## 2023-05-14 DIAGNOSIS — R6520 Severe sepsis without septic shock: Secondary | ICD-10-CM | POA: Diagnosis present

## 2023-05-14 DIAGNOSIS — R578 Other shock: Secondary | ICD-10-CM | POA: Diagnosis present

## 2023-05-14 DIAGNOSIS — I214 Non-ST elevation (NSTEMI) myocardial infarction: Secondary | ICD-10-CM | POA: Diagnosis present

## 2023-05-14 DIAGNOSIS — G061 Intraspinal abscess and granuloma: Secondary | ICD-10-CM | POA: Diagnosis present

## 2023-05-14 DIAGNOSIS — J9602 Acute respiratory failure with hypercapnia: Secondary | ICD-10-CM | POA: Diagnosis present

## 2023-05-14 DIAGNOSIS — R54 Age-related physical debility: Secondary | ICD-10-CM | POA: Diagnosis present

## 2023-05-14 DIAGNOSIS — I5023 Acute on chronic systolic (congestive) heart failure: Secondary | ICD-10-CM | POA: Diagnosis present

## 2023-05-14 DIAGNOSIS — Z85038 Personal history of other malignant neoplasm of large intestine: Secondary | ICD-10-CM

## 2023-05-14 DIAGNOSIS — I4901 Ventricular fibrillation: Secondary | ICD-10-CM | POA: Diagnosis present

## 2023-05-14 DIAGNOSIS — I959 Hypotension, unspecified: Secondary | ICD-10-CM

## 2023-05-14 DIAGNOSIS — Z66 Do not resuscitate: Secondary | ICD-10-CM | POA: Diagnosis present

## 2023-05-14 DIAGNOSIS — I472 Ventricular tachycardia, unspecified: Secondary | ICD-10-CM | POA: Diagnosis present

## 2023-05-14 DIAGNOSIS — G9341 Metabolic encephalopathy: Secondary | ICD-10-CM | POA: Diagnosis present

## 2023-05-14 DIAGNOSIS — E43 Unspecified severe protein-calorie malnutrition: Secondary | ICD-10-CM | POA: Insufficient documentation

## 2023-05-14 DIAGNOSIS — Z955 Presence of coronary angioplasty implant and graft: Secondary | ICD-10-CM

## 2023-05-14 DIAGNOSIS — L97529 Non-pressure chronic ulcer of other part of left foot with unspecified severity: Secondary | ICD-10-CM | POA: Diagnosis present

## 2023-05-14 DIAGNOSIS — B9561 Methicillin susceptible Staphylococcus aureus infection as the cause of diseases classified elsewhere: Secondary | ICD-10-CM

## 2023-05-14 DIAGNOSIS — I255 Ischemic cardiomyopathy: Secondary | ICD-10-CM | POA: Diagnosis present

## 2023-05-14 DIAGNOSIS — R570 Cardiogenic shock: Secondary | ICD-10-CM | POA: Diagnosis present

## 2023-05-14 DIAGNOSIS — D696 Thrombocytopenia, unspecified: Secondary | ICD-10-CM | POA: Diagnosis present

## 2023-05-14 DIAGNOSIS — N179 Acute kidney failure, unspecified: Secondary | ICD-10-CM | POA: Diagnosis present

## 2023-05-14 DIAGNOSIS — E8809 Other disorders of plasma-protein metabolism, not elsewhere classified: Secondary | ICD-10-CM | POA: Diagnosis present

## 2023-05-14 DIAGNOSIS — I272 Pulmonary hypertension, unspecified: Secondary | ICD-10-CM | POA: Diagnosis present

## 2023-05-14 DIAGNOSIS — Z6821 Body mass index (BMI) 21.0-21.9, adult: Secondary | ICD-10-CM

## 2023-05-14 DIAGNOSIS — E785 Hyperlipidemia, unspecified: Secondary | ICD-10-CM | POA: Diagnosis present

## 2023-05-14 DIAGNOSIS — Z91013 Allergy to seafood: Secondary | ICD-10-CM

## 2023-05-14 DIAGNOSIS — Z860101 Personal history of adenomatous and serrated colon polyps: Secondary | ICD-10-CM

## 2023-05-14 DIAGNOSIS — Z515 Encounter for palliative care: Secondary | ICD-10-CM

## 2023-05-14 DIAGNOSIS — I4892 Unspecified atrial flutter: Secondary | ICD-10-CM | POA: Diagnosis not present

## 2023-05-14 DIAGNOSIS — K4090 Unilateral inguinal hernia, without obstruction or gangrene, not specified as recurrent: Secondary | ICD-10-CM | POA: Diagnosis present

## 2023-05-14 DIAGNOSIS — R579 Shock, unspecified: Secondary | ICD-10-CM | POA: Diagnosis not present

## 2023-05-14 DIAGNOSIS — Z9221 Personal history of antineoplastic chemotherapy: Secondary | ICD-10-CM

## 2023-05-14 DIAGNOSIS — E1165 Type 2 diabetes mellitus with hyperglycemia: Secondary | ICD-10-CM | POA: Diagnosis present

## 2023-05-14 DIAGNOSIS — I5082 Biventricular heart failure: Secondary | ICD-10-CM | POA: Diagnosis present

## 2023-05-14 DIAGNOSIS — R7401 Elevation of levels of liver transaminase levels: Secondary | ICD-10-CM | POA: Diagnosis present

## 2023-05-14 DIAGNOSIS — Z923 Personal history of irradiation: Secondary | ICD-10-CM

## 2023-05-14 DIAGNOSIS — I4819 Other persistent atrial fibrillation: Secondary | ICD-10-CM | POA: Diagnosis present

## 2023-05-14 DIAGNOSIS — E872 Acidosis, unspecified: Secondary | ICD-10-CM | POA: Diagnosis present

## 2023-05-14 LAB — BLOOD GAS, ARTERIAL
Acid-Base Excess: 8.7 mmol/L — ABNORMAL HIGH (ref 0.0–2.0)
Bicarbonate: 32.8 mmol/L — ABNORMAL HIGH (ref 20.0–28.0)
FIO2: 80 %
MECHVT: 450 mL
Mechanical Rate: 20
O2 Saturation: 100 %
PEEP: 5 cmH2O
Patient temperature: 37
pCO2 arterial: 42 mmHg (ref 32–48)
pH, Arterial: 7.5 — ABNORMAL HIGH (ref 7.35–7.45)
pO2, Arterial: 285 mmHg — ABNORMAL HIGH (ref 83–108)

## 2023-05-14 LAB — COMPREHENSIVE METABOLIC PANEL WITH GFR
ALT: <5 U/L (ref 0–44)
ALT: 5 U/L (ref 0–44)
AST: 25 U/L (ref 15–41)
AST: 41 U/L (ref 15–41)
Albumin: 2.2 g/dL — ABNORMAL LOW (ref 3.5–5.0)
Albumin: 2.2 g/dL — ABNORMAL LOW (ref 3.5–5.0)
Alkaline Phosphatase: 105 U/L (ref 38–126)
Alkaline Phosphatase: 141 U/L — ABNORMAL HIGH (ref 38–126)
Anion gap: 10 (ref 5–15)
Anion gap: 14 (ref 5–15)
BUN: 54 mg/dL — ABNORMAL HIGH (ref 8–23)
BUN: 58 mg/dL — ABNORMAL HIGH (ref 8–23)
CO2: 30 mmol/L (ref 22–32)
CO2: 26 mmol/L (ref 22–32)
Calcium: 8.6 mg/dL — ABNORMAL LOW (ref 8.9–10.3)
Calcium: 8.3 mg/dL — ABNORMAL LOW (ref 8.9–10.3)
Chloride: 97 mmol/L — ABNORMAL LOW (ref 98–111)
Chloride: 95 mmol/L — ABNORMAL LOW (ref 98–111)
Creatinine, Ser: 2.42 mg/dL — ABNORMAL HIGH (ref 0.61–1.24)
Creatinine, Ser: 2.81 mg/dL — ABNORMAL HIGH (ref 0.61–1.24)
GFR, Estimated: 27 mL/min — ABNORMAL LOW (ref >60)
GFR, Estimated: 23 mL/min — ABNORMAL LOW (ref >60)
Glucose, Bld: 134 mg/dL — ABNORMAL HIGH (ref 70–99)
Glucose, Bld: 218 mg/dL — ABNORMAL HIGH (ref 70–99)
Potassium: 3.5 mmol/L (ref 3.5–5.1)
Potassium: 3.8 mmol/L (ref 3.5–5.1)
Sodium: 137 mmol/L (ref 135–145)
Sodium: 135 mmol/L (ref 135–145)
Total Bilirubin: 1.5 mg/dL — ABNORMAL HIGH (ref 0.0–1.2)
Total Bilirubin: 1.5 mg/dL — ABNORMAL HIGH (ref 0.0–1.2)
Total Protein: 6.5 g/dL (ref 6.5–8.1)
Total Protein: 6.4 g/dL — ABNORMAL LOW (ref 6.5–8.1)

## 2023-05-14 LAB — BRAIN NATRIURETIC PEPTIDE: B Natriuretic Peptide: 2694 pg/mL — ABNORMAL HIGH (ref 0.0–100.0)

## 2023-05-14 LAB — CBC WITH DIFFERENTIAL/PLATELET
Abs Immature Granulocytes: 0.03 10*3/uL (ref 0.00–0.07)
Basophils Absolute: 0 10*3/uL (ref 0.0–0.1)
Basophils Relative: 0 %
Eosinophils Absolute: 0.1 10*3/uL (ref 0.0–0.5)
Eosinophils Relative: 1 %
HCT: 38 % — ABNORMAL LOW (ref 39.0–52.0)
Hemoglobin: 12 g/dL — ABNORMAL LOW (ref 13.0–17.0)
Immature Granulocytes: 0 %
Lymphocytes Relative: 13 %
Lymphs Abs: 1.2 10*3/uL (ref 0.7–4.0)
MCH: 32.3 pg (ref 26.0–34.0)
MCHC: 31.6 g/dL (ref 30.0–36.0)
MCV: 102.4 fL — ABNORMAL HIGH (ref 80.0–100.0)
Monocytes Absolute: 0.9 10*3/uL (ref 0.1–1.0)
Monocytes Relative: 10 %
Neutro Abs: 7 10*3/uL (ref 1.7–7.7)
Neutrophils Relative %: 76 %
Platelets: 198 10*3/uL (ref 150–400)
RBC: 3.71 MIL/uL — ABNORMAL LOW (ref 4.22–5.81)
RDW: 16.1 % — ABNORMAL HIGH (ref 11.5–15.5)
WBC: 9.2 10*3/uL (ref 4.0–10.5)
nRBC: 0 % (ref 0.0–0.2)

## 2023-05-14 LAB — PROTIME-INR
INR: 2.8 — ABNORMAL HIGH (ref 0.8–1.2)
INR: 2.3 — ABNORMAL HIGH (ref 0.8–1.2)
Prothrombin Time: 29.3 s — ABNORMAL HIGH (ref 11.4–15.2)
Prothrombin Time: 25.1 s — ABNORMAL HIGH (ref 11.4–15.2)

## 2023-05-14 LAB — TROPONIN I (HIGH SENSITIVITY)
Troponin I (High Sensitivity): 52 ng/L — ABNORMAL HIGH (ref <18)
Troponin I (High Sensitivity): 160 ng/L (ref <18)
Troponin I (High Sensitivity): 384 ng/L (ref <18)

## 2023-05-14 LAB — COOXEMETRY PANEL
Carboxyhemoglobin: 1.6 % — ABNORMAL HIGH (ref 0.5–1.5)
Methemoglobin: <0.7 % (ref 0.0–1.5)
O2 Saturation: 74.5 %
Total hemoglobin: 12.6 g/dL (ref 12.0–16.0)
Total oxygen content: 72.9 %

## 2023-05-14 LAB — GLUCOSE, CAPILLARY
Glucose-Capillary: 127 mg/dL — ABNORMAL HIGH (ref 70–99)
Glucose-Capillary: 220 mg/dL — ABNORMAL HIGH (ref 70–99)
Glucose-Capillary: 174 mg/dL — ABNORMAL HIGH (ref 70–99)
Glucose-Capillary: 229 mg/dL — ABNORMAL HIGH (ref 70–99)

## 2023-05-14 LAB — LACTIC ACID, PLASMA
Lactic Acid, Venous: 4.3 mmol/L (ref 0.5–1.9)
Lactic Acid, Venous: 1.4 mmol/L (ref 0.5–1.9)

## 2023-05-14 MED ORDER — NOREPINEPHRINE 16 MG/250ML-% IV SOLN
0.0000 ug/min | INTRAVENOUS | Status: DC
  Administered 2023-05-14: 24 ug/min via INTRAVENOUS
  Administered 2023-05-15: 17 ug/min via INTRAVENOUS
  Filled 2023-05-14 (×4): qty 250

## 2023-05-14 MED ORDER — POLYETHYLENE GLYCOL 3350 17 G PO PACK: 17.0000 g | PACK | Freq: Every day | ORAL | Status: DC | PRN

## 2023-05-14 MED ORDER — SPIRONOLACTONE 25 MG PO TABS: 12.5000 mg | ORAL_TABLET | Freq: Every day | ORAL | 0 refills | Status: DC

## 2023-05-14 MED ORDER — AMIODARONE HCL 200 MG PO TABS: 200.0000 mg | ORAL_TABLET | Freq: Two times a day (BID) | ORAL | Status: DC

## 2023-05-14 MED ORDER — MIDAZOLAM HCL 5 MG/5ML IJ SOLN
4.0000 mg | Freq: Once | INTRAMUSCULAR | Status: AC
  Administered 2023-05-14: 4 mg via INTRAVENOUS
  Filled 2023-05-14: qty 5

## 2023-05-14 MED ORDER — POLYETHYLENE GLYCOL 3350 17 G PO PACK
17.0000 g | PACK | Freq: Every day | ORAL | Status: DC
  Administered 2023-05-15 – 2023-05-16 (×2): 17 g
  Filled 2023-05-14 (×2): qty 1

## 2023-05-14 MED ORDER — EPINEPHRINE 1 MG/10ML IJ SOSY
PREFILLED_SYRINGE | INTRAMUSCULAR | Status: AC | PRN
  Administered 2023-05-14: 1 mg via INTRAVENOUS

## 2023-05-14 MED ORDER — SODIUM CHLORIDE 0.9 % IV BOLUS: 1000.0000 mL | Freq: Once | INTRAVENOUS | Status: DC

## 2023-05-14 MED ORDER — ORAL CARE MOUTH RINSE
15.0000 mL | OROMUCOSAL | Status: DC
  Administered 2023-05-14 – 2023-05-18 (×44): 15 mL via OROMUCOSAL
  Filled 2023-05-14 (×4): qty 15

## 2023-05-14 MED ORDER — NOREPINEPHRINE 4 MG/250ML-% IV SOLN
0.0000 ug/min | INTRAVENOUS | Status: DC
  Administered 2023-05-14: 20 ug/min via INTRAVENOUS
  Administered 2023-05-14: 5 ug/min via INTRAVENOUS
  Filled 2023-05-14 (×2): qty 250

## 2023-05-14 MED ORDER — FENTANYL CITRATE PF 50 MCG/ML IJ SOSY
50.0000 ug | PREFILLED_SYRINGE | Freq: Once | INTRAMUSCULAR | Status: AC
  Administered 2023-05-14: 50 ug via INTRAVENOUS

## 2023-05-14 MED ORDER — CEFAZOLIN SODIUM-DEXTROSE 2-4 GM/100ML-% IV SOLN
2.0000 g | Freq: Two times a day (BID) | INTRAVENOUS | Status: DC
  Administered 2023-05-14 – 2023-05-19 (×10): 2 g via INTRAVENOUS
  Filled 2023-05-14 (×11): qty 100

## 2023-05-14 MED ORDER — MIDAZOLAM-SODIUM CHLORIDE 100-0.9 MG/100ML-% IV SOLN
0.5000 mg/h | INTRAVENOUS | Status: DC
  Administered 2023-05-14: 0.5 mg/h via INTRAVENOUS
  Filled 2023-05-14 (×2): qty 100

## 2023-05-14 MED ORDER — TORSEMIDE 20 MG PO TABS: 20.0000 mg | ORAL_TABLET | ORAL | Status: DC

## 2023-05-14 MED ORDER — FAMOTIDINE 20 MG PO TABS
20.0000 mg | ORAL_TABLET | Freq: Two times a day (BID) | ORAL | Status: DC
  Administered 2023-05-15: 20 mg
  Filled 2023-05-14: qty 1

## 2023-05-14 MED ORDER — AMIODARONE HCL 200 MG PO TABS: 200.0000 mg | ORAL_TABLET | Freq: Two times a day (BID) | ORAL | 0 refills | Status: DC

## 2023-05-14 MED ORDER — SUCCINYLCHOLINE CHLORIDE 200 MG/10ML IV SOSY
100.0000 mg | PREFILLED_SYRINGE | Freq: Once | INTRAVENOUS | Status: AC
  Administered 2023-05-14: 100 mg via INTRAVENOUS

## 2023-05-14 MED ORDER — ETOMIDATE 2 MG/ML IV SOLN
20.0000 mg | Freq: Once | INTRAVENOUS | Status: AC
  Administered 2023-05-14: 20 mg via INTRAVENOUS

## 2023-05-14 MED ORDER — INSULIN ASPART 100 UNIT/ML IJ SOLN
0.0000 [IU] | INTRAMUSCULAR | Status: DC
  Administered 2023-05-14: 3 [IU] via SUBCUTANEOUS
  Administered 2023-05-15: 7 [IU] via SUBCUTANEOUS
  Filled 2023-05-14 (×2): qty 1

## 2023-05-14 MED ORDER — TORSEMIDE 20 MG PO TABS: ORAL_TABLET | ORAL | Status: DC

## 2023-05-14 MED ORDER — SODIUM CHLORIDE 0.9 % IV SOLN
INTRAVENOUS | Status: AC | PRN
Start: 2023-05-14 — End: 2023-05-15

## 2023-05-14 MED ORDER — PROPOFOL 1000 MG/100ML IV EMUL
5.0000 ug/kg/min | INTRAVENOUS | Status: DC
  Administered 2023-05-15: 5 ug/kg/min via INTRAVENOUS
  Administered 2023-05-15: 25 ug/kg/min via INTRAVENOUS
  Administered 2023-05-16: 5 ug/kg/min via INTRAVENOUS
  Administered 2023-05-17 – 2023-05-18 (×4): 30 ug/kg/min via INTRAVENOUS
  Filled 2023-05-14 (×9): qty 100

## 2023-05-14 MED ORDER — VASOPRESSIN 20 UNITS/100 ML INFUSION FOR SHOCK
0.0000 [IU]/min | INTRAVENOUS | Status: DC
  Administered 2023-05-16 – 2023-05-18 (×5): 0.03 [IU]/min via INTRAVENOUS
  Filled 2023-05-14 (×7): qty 100

## 2023-05-14 MED ORDER — MIDAZOLAM HCL 2 MG/2ML IJ SOLN
1.0000 mg | INTRAMUSCULAR | Status: DC | PRN
Start: 2023-05-14 — End: 2023-05-18
  Administered 2023-05-15 (×5): 2 mg via INTRAVENOUS
  Filled 2023-05-14 (×4): qty 2

## 2023-05-14 MED ORDER — DOPAMINE-DEXTROSE 3.2-5 MG/ML-% IV SOLN: 0.0000 ug/kg/min | INTRAVENOUS | Status: DC

## 2023-05-14 MED ORDER — FENTANYL 2500MCG IN NS 250ML (10MCG/ML) PREMIX INFUSION
0.0000 ug/h | INTRAVENOUS | Status: DC
  Administered 2023-05-14: 50 ug/h via INTRAVENOUS
  Administered 2023-05-15: 25 ug/h via INTRAVENOUS
  Administered 2023-05-16: 125 ug/h via INTRAVENOUS
  Administered 2023-05-18: 50 ug/h via INTRAVENOUS
  Filled 2023-05-14 (×3): qty 250

## 2023-05-14 MED ORDER — MIDAZOLAM HCL 2 MG/2ML IJ SOLN
INTRAMUSCULAR | Status: AC
  Filled 2023-05-14: qty 2

## 2023-05-14 MED ORDER — HYDROCODONE-ACETAMINOPHEN 5-325 MG PO TABS
1.0000 | ORAL_TABLET | Freq: Four times a day (QID) | ORAL | 0 refills | Status: AC | PRN
Start: 2023-05-14 — End: 2023-05-15

## 2023-05-14 MED ORDER — ORAL CARE MOUTH RINSE: 15.0000 mL | OROMUCOSAL | Status: DC | PRN

## 2023-05-14 MED ORDER — PROPOFOL 1000 MG/100ML IV EMUL
INTRAVENOUS | Status: AC
  Filled 2023-05-14: qty 100

## 2023-05-14 MED ORDER — CHLORHEXIDINE GLUCONATE CLOTH 2 % EX PADS
6.0000 | MEDICATED_PAD | Freq: Every day | CUTANEOUS | Status: DC
  Administered 2023-05-14 – 2023-05-19 (×5): 6 via TOPICAL
  Filled 2023-05-14: qty 6

## 2023-05-14 MED ORDER — DOCUSATE SODIUM 100 MG PO CAPS: 100.0000 mg | ORAL_CAPSULE | Freq: Two times a day (BID) | ORAL | Status: DC | PRN

## 2023-05-14 MED ORDER — CEFAZOLIN IV (FOR PTA / DISCHARGE USE ONLY): 2.0000 g | Freq: Two times a day (BID) | INTRAVENOUS | 0 refills | Status: DC

## 2023-05-14 MED ORDER — NOREPINEPHRINE 4 MG/250ML-% IV SOLN
INTRAVENOUS | Status: AC
  Filled 2023-05-14: qty 250

## 2023-05-14 MED ORDER — FENTANYL CITRATE PF 50 MCG/ML IJ SOSY
50.0000 ug | PREFILLED_SYRINGE | INTRAMUSCULAR | Status: DC | PRN
  Administered 2023-05-15: 50 ug via INTRAVENOUS
  Administered 2023-05-15 (×2): 100 ug via INTRAVENOUS
  Administered 2023-05-17: 50 ug via INTRAVENOUS
  Filled 2023-05-14: qty 2
  Filled 2023-05-14: qty 1

## 2023-05-14 MED ORDER — FENTANYL CITRATE PF 50 MCG/ML IJ SOSY
PREFILLED_SYRINGE | INTRAMUSCULAR | Status: AC
  Filled 2023-05-14: qty 1

## 2023-05-14 MED ORDER — SODIUM CHLORIDE 0.9 % IV SOLN
INTRAVENOUS | Status: AC | PRN
  Administered 2023-05-14: 999 mL/h via INTRAVENOUS

## 2023-05-14 MED ORDER — DOCUSATE SODIUM 50 MG/5ML PO LIQD
100.0000 mg | Freq: Two times a day (BID) | ORAL | Status: DC
  Administered 2023-05-15 – 2023-05-16 (×4): 100 mg
  Filled 2023-05-14 (×4): qty 10

## 2023-05-14 NOTE — [place] FL2 (Signed)
 [place] MEDICAID FL2 LEVEL OF CARE FORM     IDENTIFICATION  Patient Name: [name]. Birthdate: April 13, 1945 Sex: male Admission Date (Current Location): 05/03/2023  [facility] and [place] Number:  Chiropodist and Address:  [facility], [address], [place], [place] [address]      Provider Number: [id number]  Attending Physician Name and Address:  Charise Killian, MD  Relative Name and Phone Number:       Current Level of Care: Hospital Recommended Level of Care: Skilled Nursing Facility Prior Approval Number:    Date Approved/Denied:   PASRR Number: [id number]  Discharge Plan: SNF    Current Diagnoses: Patient Active Problem List   Diagnosis Date Noted   Persistent atrial fibrillation (HCC) 05/11/2023   NICM (nonischemic cardiomyopathy) (HCC) 05/11/2023   Spinal epidural abscess 05/07/2023   MSSA bacteremia 05/06/2023   Non-traumatic rhabdomyolysis 05/06/2023   Severe sepsis (HCC) 05/03/2023   Traumatic rhabdomyolysis (HCC) 05/03/2023   Accidental fall from bed, initial encounter 05/03/2023   Supratherapeutic INR 05/03/2023   Elevated troponin 05/03/2023   Abnormal LFTs--possible biliary tract disease 05/03/2023   Pneumonia, possible 05/03/2023   Syncope, presumed 05/03/2023   Acute ST elevation myocardial infarction (STEMI) of inferior wall (HCC) 05/03/2023   Abnormal MRI, cervical spine 05/03/2023   Endocarditis 05/03/2023   Right shoulder pain 10/23/2022   Hyperkalemia 09/18/2022   Allergic rhinitis 05/25/2022   Atherosclerosis of native arteries of extremity with intermittent claudication (HCC) 03/21/2022   Long term (current) use of anticoagulants 10/25/2021   Rash in adult 10/15/2021   Atrial fibrillation with rapid ventricular response (HCC) 05/29/2021   Hydrocele, left 11/01/2020   Status post laparoscopic hernia repair 09/27/2020   Anemia in chronic kidney disease 04/08/2019   Stage 3a chronic kidney  disease (HCC) 04/08/2019   Hyperlipidemia 04/28/2018   Blistering of skin 01/23/2018   Leg wound, left, initial encounter 01/02/2018   Absent pedal pulses 01/02/2018   Onychomycosis 01/02/2018   Leukopenia 09/28/2017   Thrombocytopenia (HCC) 09/28/2017   B12 deficiency 09/28/2017   Renal mass 09/10/2017   Diabetes (HCC) 09/10/2017   Leg wound, right, initial encounter 09/10/2017   Acute renal failure superimposed on stage 3b chronic kidney disease (HCC) 08/14/2017   Anemia 09/12/2015   CAD S/P percutaneous coronary angioplasty    Hypertensive heart disease    CAD (coronary artery disease)    Mixed Ischemic and Non-ischemic Cardiomyopathy    Chronic HFrEF (heart failure with reduced ejection fraction) (HCC)    Bilateral lower extremity edema 02/10/2015   Abdominal pain 02/10/2015   Cough 02/10/2015   Neuropathy (HCC) 09/16/2014   Tubular adenoma of colon 07/20/2014   History of colon cancer 07/20/2014   Benign colon polyp 07/20/2014   Hypertension 07/20/2014   Hyperplastic colon polyp 07/20/2014    Orientation RESPIRATION BLADDER Height & Weight     Self, Time, Situation, Place  Normal Incontinent, External catheter Weight: 131 lb 13.4 oz (59.8 kg) Height:  5\' 9"  (175.3 cm)  BEHAVIORAL SYMPTOMS/MOOD NEUROLOGICAL BOWEL NUTRITION STATUS   (None)  (None) Incontinent Diet (Heart healthy)  AMBULATORY STATUS COMMUNICATION OF NEEDS Skin   Limited Assist Verbally Other (Comment) (Non-pressure wound on left foot (silver hydrofiber, foam, hydrocerin lotion to skin). Wound on right pretibial (no dressing). Burn on left pretibial (gauze, compression wrap daily).)                       Personal Care Assistance  Level of Assistance  Bathing, Feeding, Dressing Bathing Assistance: Limited assistance Feeding assistance: Limited assistance Dressing Assistance: Limited assistance     Functional Limitations Info  Sight, Hearing, Speech Sight Info: Adequate Hearing Info:  Adequate Speech Info: Adequate    SPECIAL CARE FACTORS FREQUENCY  PT (By licensed PT), OT (By licensed OT)     PT Frequency: 5 x week OT Frequency: 5 x week            Contractures Contractures Info: Not present    Additional Factors Info  Code Status, Allergies Code Status Info: Full code Allergies Info: Shellfish Allergy           Current Medications (05/14/2023):  This is the current hospital active medication list Current Facility-Administered Medications  Medication Dose Route Frequency Provider Last Rate Last Admin   acetaminophen (TYLENOL) tablet 650 mg  650 mg Oral Q6H PRN Andris Baumann, MD   650 mg at 05/11/23 1743   Or   acetaminophen (TYLENOL) suppository 650 mg  650 mg Rectal Q6H PRN Andris Baumann, MD       albuterol (PROVENTIL) (2.5 MG/3ML) 0.083% nebulizer solution 2.5 mg  2.5 mg Nebulization Q2H PRN Andris Baumann, MD       alum & mag hydroxide-simeth (MAALOX/MYLANTA) 200-200-20 MG/5ML suspension 30 mL  30 mL Oral Q4H PRN Rosezetta Schlatter T, MD   30 mL at 05/05/23 2122   amiodarone (PACERONE) tablet 400 mg  400 mg Oral BID Laurey Morale, MD   400 mg at 05/14/23 0906   atorvastatin (LIPITOR) tablet 40 mg  40 mg Oral Daily Laurey Morale, MD   40 mg at 05/14/23 0906   carvedilol (COREG) tablet 3.125 mg  3.125 mg Oral BID WC Laurey Morale, MD   3.125 mg at 05/14/23 0906   ceFAZolin (ANCEF) IVPB 2g/100 mL premix  2 g Intravenous Q12H Charise Killian, MD 200 mL/hr at 05/14/23 0912 2 g at 05/14/23 0912   Chlorhexidine Gluconate Cloth 2 % PADS 6 each  6 each Topical Daily Andris Baumann, MD   6 each at 05/14/23 0908   dapagliflozin propanediol (FARXIGA) tablet 10 mg  10 mg Oral Daily Laurey Morale, MD   10 mg at 05/14/23 0906   feeding supplement (ENSURE ENLIVE / ENSURE PLUS) liquid 237 mL  237 mL Oral BID BM Rosezetta Schlatter T, MD   237 mL at 05/14/23 0905   guaiFENesin (MUCINEX) 12 hr tablet 600 mg  600 mg Oral BID Andris Baumann, MD   600 mg at 05/14/23  8119   hydrocerin (EUCERIN) cream   Topical BID Loyce Dys, MD   1 Application at 05/14/23 0905   HYDROcodone-acetaminophen (NORCO/VICODIN) 5-325 MG per tablet 1-2 tablet  1-2 tablet Oral Q4H PRN Andris Baumann, MD   1 tablet at 05/12/23 0910   insulin aspart (novoLOG) injection 0-5 Units  0-5 Units Subcutaneous QHS Rosezetta Schlatter T, MD   2 Units at 05/12/23 2135   insulin aspart (novoLOG) injection 0-9 Units  0-9 Units Subcutaneous TID WC Rosezetta Schlatter T, MD   1 Units at 05/14/23 0905   lactulose (CHRONULAC) 10 GM/15ML solution 30 g  30 g Oral BID PRN Loyce Dys, MD       LORazepam (ATIVAN) injection 0.5 mg  0.5 mg Intravenous Q4H PRN Chesley Noon, MD   0.5 mg at 05/07/23 1309   mupirocin ointment (BACTROBAN) 2 %   Nasal BID Andris Baumann,  MD   1 Application at 05/14/23 0907   ondansetron (ZOFRAN) tablet 4 mg  4 mg Oral Q6H PRN Andris Baumann, MD       Or   ondansetron [facility]) injection 4 mg  4 mg Intravenous Q6H PRN Andris Baumann, MD       Oral care mouth rinse  15 mL Mouth Rinse 4 times per day Andris Baumann, MD   15 mL at 05/14/23 1610   Oral care mouth rinse  15 mL Mouth Rinse PRN Loyce Dys, MD       pneumococcal 20-valent conjugate vaccine (PREVNAR 20) injection 0.5 mL  0.5 mL Intramuscular Tomorrow-1000 Rosezetta Schlatter T, MD       sodium chloride flush (NS) 0.9 % injection 10-40 mL  10-40 mL Intracatheter Q12H Rosezetta Schlatter T, MD   10 mL at 05/14/23 9604   sodium chloride flush (NS) 0.9 % injection 10-40 mL  10-40 mL Intracatheter PRN Loyce Dys, MD       spironolactone (ALDACTONE) tablet 12.5 mg  12.5 mg Oral Daily Laurey Morale, MD   12.5 mg at 05/14/23 0906   [START ON 05/16/2023] torsemide (DEMADEX) tablet 20 mg  20 mg Oral QODAY Bensimhon, Bevelyn Buckles, MD       [START ON 05/15/2023] torsemide (DEMADEX) tablet 40 mg  40 mg Oral QODAY Bensimhon, Bevelyn Buckles, MD       Warfarin - Pharmacist Dosing Inpatient   Does not apply q1600 Ronnald Ramp [facility]   Given at 05/13/23  1738     Discharge Medications: Please see discharge summary for a list of discharge medications.  Relevant Imaging Results:  Relevant Lab Results:   Additional Information SS#: [id number]  Margarito Liner, LCSW

## 2023-05-14 NOTE — Significant Event (Signed)
 [facility] Event Note  Date: 05/14/23 Time: 5:48 PM  Dr. Gala Romney and I responded to CODE BLUE that was called.  Patient found unresponsive on EMS stretcher.  Very faint pulse briefly noted; CPR started by Dr. Gala Romney.  Once defibrillation pads were applied, patient was found to be in ventricular fibrillation.  Defibrillation performed at 200 J x 1 and 1 mg epinephrine administered via indwelling PICC line.  CPR continued for 2 minutes, at which time junctional rhythm and palpable pulse identified.  BVM ventilation provided by RT and patient transported to ED for further evaluation and management per Dr. Marisa Severin in the ED.  Yvonne Kendall, MD [facility]

## 2023-05-14 NOTE — Progress Notes (Signed)
 Per patient request wife called notified that transport is at the hospital and will be heading to snf soon

## 2023-05-14 NOTE — Progress Notes (Signed)
 PHARMACY - ANTICOAGULATION CONSULT NOTE  Pharmacy Consult for Heparin  Indication: atrial fibrillation - transitioning from warfarin   Allergies  Allergen Reactions   Shellfish Allergy Other (See Comments) and Hives    Pt. instructed by MD to avoid seafood    Patient Measurements: Weight: 67.5 kg (148 lb 13 oz)  Vital Signs: Temp: 95.9 F (35.5 C) (04/08 2310) Temp Source: Bladder (04/08 2300) BP: 134/73 (04/08 2310) Pulse Rate: 64 (04/08 2310)  Labs: Recent Labs    05/12/23 0640 05/12/23 0651 05/13/23 0510 05/14/23 0421 05/14/23 1823 05/14/23 2021  HGB 12.8*  --  13.1  --  12.0*  --   HCT 39.1  --  39.1  --  38.0*  --   PLT 152  --  177  --  198  --   LABPROT  --    < > 46.4* 29.3*  --  25.1*  INR  --    < > 5.0* 2.8*  --  2.3*  CREATININE 2.34*  --  2.33* 2.42* 2.81*  --   TROPONINIHS  --   --   --   --  52* 160*   < > = values in this interval not displayed.    Estimated Creatinine Clearance: 21.4 mL/min (A) (by C-G formula based on SCr of 2.81 mg/dL (H)).   Medical History: Past Medical History:  Diagnosis Date   Adenocarcinoma of colon (HCC) 06/19/2013   a. moderately differentiated stage IIIc (T4bN2a M0) adenocarcinoma of colon  s/p colectomy followed by chemoRx with oxaliplatin & fluorouacil.   Anemia    CAD (coronary artery disease)    a. 02/2015 Cath: LM nl, LAD 50/40 mid/distal, LCX 80p, RCA 40p, 30d, RPL1 40, CO 3.27, CI 1.65; b. 06/09/15 PCI: LM minor irregs, m-dLAD 50%, dLAD 40%, pLCx 80% (s/p PCI/DES 0%), pRCA 40%, dRCA 30%, 1st RPLB 40%   Chronic kidney disease, stage 3a (HCC)    Chronic systolic CHF (congestive heart failure) (HCC)    a. 02/2015 Echo: EF 20-25%, diff HK, mod MR, mildy dil LA, mildly dil RV w/ mod RV syst dysfxn, mildly dil RA, mod TR, PASP ; b. 09/2017 Echo: EF 40-45%, diff HK. Gr1 DD. Mild to mod MR. Mildly to mod dil LA; c. 09/2019 Echo: EF 25-30%, glob HK, mod reduced RV fxn, sev elev PASP. Mod dil LA. Mild-mod MR; d. 05/2021  Echo: EF 20-25%, glob HK, sev red RV fxn, sev BAE, sev MR/TR/TS, triv AI.   Diabetes mellitus without complication (HCC)    Essential hypertension    Hypertensive heart disease    Left inguinal hernia 05/2020   Mixed Ischemic and Non-ischemic Cardiomyopathy    a. 02/2015 EF 20-25%, diff HK; b. 09/2016 EF 40-45%; c. 09/2019 EF 25-30%; d. 05/2021 Echo: EF 20-25%, glob HK.   Persistent atrial fibrillation (HCC)    a. Dx 05/2021-->CHA2DS2VASc = 5-->eliquis.   Pulmonary hypertension (HCC)    Severe mitral regurgitation    a. 09/2017 Echo: Mild to mod MR; b. 09/2019 Echo: mild-mod MR; c. 05/2021 Echo: Sev MR/TR/TS.   Tubular adenoma of colon     Medications:  Medications Prior to Admission  Medication Sig Dispense Refill Last Dose/Taking   atorvastatin (LIPITOR) 40 MG tablet TAKE 1 TABLET(40 MG) BY MOUTH DAILY 90 tablet 3 05/14/2023   carvedilol (COREG) 3.125 MG tablet TAKE 1 TABLET BY MOUTH 2 TIMES DAILY WITH A MEAL 180 tablet 2 05/14/2023   cholecalciferol (VITAMIN D3) 25 MCG (1000 UNIT) tablet Take 1,000  Units by mouth daily.   05/14/2023   FARXIGA 10 MG TABS tablet TAKE 1 TABLET(10 MG) BY MOUTH DAILY BEFORE BREAKFAST 30 tablet 6 05/14/2023   ferrous sulfate 325 (65 FE) MG tablet Take 325 mg by mouth daily with breakfast.   05/14/2023   gabapentin (NEURONTIN) 100 MG capsule Take 100 mg by mouth 3 (three) times daily. Taking 2 -100 mg pills in am and 1 -100 mg at night   05/14/2023   loratadine (CLARITIN) 10 MG tablet TAKE 1 TABLET(10 MG) BY MOUTH DAILY 30 tablet 11 05/14/2023   Magnesium Oxide 400 MG CAPS Take 1 capsule (400 mg total) by mouth daily. 90 capsule 3 05/14/2023   [START ON 05/16/2023] torsemide (DEMADEX) 20 MG tablet Take 40 mg one day then 20 mg the next day, so continue to alternate 40mg  to 20mg  every other day as per cardio   05/14/2023   amiodarone (PACERONE) 200 MG tablet Take 1 tablet (200 mg total) by mouth 2 (two) times daily. 60 tablet 0    Blood Glucose Monitoring Suppl DEVI 1 each by Does  not apply route daily. May substitute to any manufacturer covered by patient's insurance. 1 each 0    ceFAZolin (ANCEF) IVPB Inject 2 g into the vein every 12 (twelve) hours. Indication:  MSSA bacteremia and cervical spine abscess First Dose: Yes Last Day of Therapy:  06/14/2023 Labs - Once weekly:  CBC/D, CMP, ESR and CRP Fax weekly lab results  promptly to [phone]  and [phone] Method of administration: IV Push Method of administration may be changed at the discretion of home infusion pharmacist based upon assessment of the patient and/or caregiver's ability to self-administer the medication ordered. Please pull PIC at completion of IV antibiotics Call [phone] with critical value or questions 68 Units 0    Glucose Blood (BLOOD GLUCOSE TEST STRIPS) STRP 1 each by In Vitro route daily as needed. May substitute to any manufacturer covered by patient's insurance. 100 strip 0    HYDROcodone-acetaminophen (NORCO/VICODIN) 5-325 MG tablet Take 1-2 tablets by mouth every 6 (six) hours as needed for up to 1 day for moderate pain (pain score 4-6) or severe pain (pain score 7-10). 8 tablet 0    Lancet Device MISC 1 each by Does not apply route daily as needed. May substitute to any manufacturer covered by patient's insurance. 1 each 0    Lancets Misc. MISC 1 each by Does not apply route daily as needed. May substitute to any manufacturer covered by patient's insurance. 100 each 0    [START ON 05/15/2023] spironolactone (ALDACTONE) 25 MG tablet Take 0.5 tablets (12.5 mg total) by mouth daily. (Patient not taking: Reported on 05/14/2023) 15 tablet 0 Not Taking   warfarin (COUMADIN) 5 MG tablet TAKE 1/2 TO 1 TABLET BY MOUTH EVERY DAY AS DIRECTED BY COUMADIN CLINIC (Patient not taking: Reported on 05/14/2023) 70 tablet 1 Not Taking    Assessment: Pharmacy consulted to dose heparin for Afib in this 78 yo on warfarin PTA.    4/8:  INR @ 2021 = 2.3, therapeutic  CrCl = 21.4 ml/min  Goal of Therapy:   Heparin level 0.3-0.7 units/ml   Plan:  - Will start heparin 950 units/hr (no bolus) once INR < 2 - check INR again on 4/9 with AM labs - CBC daily   Raheel Kunkle D 05/14/2023,11:17 PM

## 2023-05-14 NOTE — Progress Notes (Signed)
 Chaplain responds to code blue in MRI hallway and provides compassionate presence as staff provide CPR and get pt moved to ED.

## 2023-05-14 NOTE — Progress Notes (Signed)
 Pt transported to CT and ICU on vent without incident. Pt remains on the vent and is tol well. Report given to ICU RT.

## 2023-05-14 NOTE — H&P (Signed)
 NAME:  Christopher Liwanag., MRN:  [id number], DOB:  [birth year], LOS: 0 ADMISSION DATE:  05/14/2023, CONSULTATION DATE:  05/14/23 REFERRING MD:  Dr. Marisa Severin, CHIEF COMPLAINT:  cardiac arrest   History of Present Illness:  78 yo M presenting to [facility] ED on 05/14/23 for evaluation after cardiac arrest.  History obtained per chart review as patient unable to participate in interview at this time. Patient was being taken down in the elevator after being discharged from the hospital when he became unresponsive and agonal, no palpable pulses in V-fib arrest. Patient defibrillated with 200 J with subsequent asystole, ACLS continued with ROSC after 5 minutes. Patient transported to ED.  Of note patient had been hospitalized from 05/03/23 - 05/14/23 with cardiology, ID & neuro-surgery consulted. Patient had arrived from home after being found by his wife after an un-witnessed fall and was unable to get up which is not his baseline. He was admitted by [facility] for treatment of Severe Sepsis suspect secondary to cervical abscess & MSSA bacteremia from LEFT wound, AKI on CKD stage 3a, Traumatic Rhabdomyolysis, Transaminitis, possible CAP, Acute on Chronic HFrEF, Supra therapeutic INR and thrombocytopenia. He was placed in a C-collar, not a candidate for surgical intervention. Started on Antibiotics, cardioverted into SR and ultimately considered stable for discharge as labs and condition had improved. TEE negative for endocarditis.  ED course: Patient arrived post ROSC, then PEA arrested for less than 5 minutes requiring 1 round of ACLS prior to ROSC. He was emergently intubated requiring mechanical ventilatory support. Patient in severe circulatory shock. Labs significant for continue hypochloremia, hyperglycemia, slightly worse AKI on CKD, slightly elevated Alk Phos, hypoalbuminemia, continued elevation in T. Bili, elevated BNP, mildly elevated troponin, lactic acidosis without leukocytosis, elevated MCV and elevated but much  improved INR.   Medications given: etomidate & succinylcholine, levophed, versed & fentanyl drips started. ACLS medications Initial Vitals: 99, 20, 103, 54/34 & 97% on BVM Significant labs: (Labs/ Imaging personally reviewed) I, Cheryll Cockayne Rust-Chester, AGACNP-BC, personally viewed and interpreted this ECG. EKG Interpretation: Date: 05/14/23, EKG Time: 17 : 44, Rate: 95, Rhythm: NSR, QRS Axis:  LAD, Intervals: normal, ST/T Wave abnormalities: non specific T wave abnormalities, Narrative Interpretation: NSR Chemistry: Na+: 135, K+: 3.8, Cl: 95, glucose: 218, BUN/Cr.: 58/ 2.81, Serum CO2/ AG: 26/ 14, Alk Phos: 141, albumin: 2.2, T.Bili: 1.5 Hematology: WBC: 9.2, Hgb: 12.0,  Troponin: 52 > 160, BNP: 2694, Lactic: 4.3, INR: 2.3 ABG: 7.50/ 42/ 285/ 32.8  CXR 05/14/23: cardiomegaly with small left sided effusion and dense left lung consolidation similar to prior x-ray. Improved aeration at right base CT head wo contrast 05/14/23: Spoke with Dr. Rise Mu regarding CT head: negative for intracranial abnormality PCCM consulted for admission due to cardiac arrest requiring emergent intubation and mechanical ventilatory support with circulatory shock in the setting of known MSSA bacteremia and a spinal mass.  Pertinent  Medical History  HFrEF 20-25%, (Global hypokinesis, NICM, moderate - severe TR) Pulmonary HTN CKD stage 3a CAD Anemia Adenocarcinoma of colon PAF on warfarin Left inguinal hernia HTN T2DM  Significant Hospital Events: Including procedures, antibiotic start and stop dates in addition to other pertinent events   05/14/23: Re-admit to ICU following cardiac arrest during discharge requiring mechanical ventilatory support with circulatory shock in the setting of known MSSA bacteremia and a spinal mass..  Interim History / Subjective:  Patient intubated and sedated, RASS: -4. Will stop sedatives in order to better assess mental status. Current normothermia candidate  Objective  Blood pressure (!) 150/81, pulse 68, resp. rate 20, SpO2 100%.    Vent Mode: PRVC FiO2 (%):  [80 %] 80 % Set Rate:  [20 bmp] 20 bmp Vt Set:  [450 mL] 450 mL PEEP:  [5 cmH20] 5 cmH20  No intake or output data in the 24 hours ending 05/14/23 2030 There were no vitals filed for this visit.  Examination: General: Adult male, critically ill, lying in bed intubated & sedated requiring mechanical ventilation, NAD HEENT: MM pink/moist, anicteric, atraumatic, neck supple Neuro: RASS: -4, unable to follow commands, PERRL +1, +cough/ gag/ corneal reflexes CV: s1s2 RRR, NSR on monitor, no r/m/g Pulm: Regular, non labored on PRVC 50% & PEEP 5, breath sounds clear-BUL & diminished-BLL GI: soft, flat, non tender, bs x 4 GU: foley in place with clear yellow urine Skin: chronic left foot wound, dry/cracked skin BLE Extremities: warm/dry, pulses + 2 R/P, no edema noted  Resolved Hospital Problem list     Assessment & Plan:  Cardiac arrest: initial rhythm V-fib  Circulatory shock PAF - on warfarin Acute on Chronic HFrEF PMHx: HFrEF, NICM, RV dysfunction, mild-mod MR, mod-severe TR, global hypokinesis, HLD, HTN 8-10 minutes downtime, received 1 defibrillations, suspected anoxic injury - Continuous cardiac monitoring - Diurese with the use of IV lasix  as renal function and hemodynamics allow, appreciate Heart failure team recommendations  - candidate for Normothermia Protocol  - assess CVP Q 4 h & Daily weights - Continue vasopressors: levophed & vasopressin PRN, to maintain MAP > 65 - Trend troponin, lactic has normalized, obtain co-ox panel - Cardiology consulted, appreciate input - Hold outpatient regimen: carvedilol, torsemide, spironolactone - consider restarting amiodarone depending on severity of shock overnight. Recent cardioversion to SR - if CT head clear will start heparin drip in lieu of warfarin, consider bridging once stabilized Addendum: patient woke up and began moving all  extremities non-purposefully, RASS -2 to -3. Normothermia protocol stopped  Acute hypoxic respiratory failure  CAP  - Ventilator settings: PRVC 6 mL/kg, 50% FiO2, 5 PEEP - Wean PEEP and FiO2 for sats greater than 90% - Plateau pressures less than 30 cm H20 - VAP bundle in place - Intermittent chest x-ray & ABG - Daily WUA/ SBT as tolerated - Ensure adequate pulmonary hygiene  - F/u cultures, trend PCT - Continue Cefazolin  MSSA bacteremia secondary to LEFT foot wound infection Suspected Cervical Abscess - continue Cefazolin - Daily CBC, monitor WBC/ fever curve - Persistent hypotension consider stress dose steroids  - consult WOC - consider consulting ID PRN  Acute Kidney Injury superimposed on CKD stage 3b - Strict I/O's: alert provider if UOP < 0.5 mL/kg/hr - Daily BMP, replace electrolytes PRN - Avoid nephrotoxic agents as able, ensure adequate renal perfusion - Consult nephrology if iHD or CRRT indicated  - consider renal US  Type 2 Diabetes Mellitus Hemoglobin A1C: 6.9 - Monitor CBG Q 4 hours - SSI very sensitive dosing - target range while in ICU: 140-180 - follow ICU hyper/hypo-glycemia protocol  Very Mild Transaminitis - much improved from previous - Trend hepatic function - Consider RUQ [place] - avoid hepatotoxic agents  Best Practice (right click and "Reselect all SmartList Selections" daily)  Diet/type: NPO w/ meds via tube DVT prophylaxis SCD Pressure ulcer(s): N/A GI prophylaxis: H2B Lines: yes and it is still needed Foley:  Yes, and it is still needed Code Status:  full code Last date of multidisciplinary goals of care discussion [05/14/23]  Labs   CBC: Recent Labs  Lab 05/10/23 0622 05/11/23 1000 05/11/23 2200 05/12/23 0640 05/13/23 0510 05/14/23 1823  WBC 6.0 6.4 7.1 6.5 7.6 9.2  NEUTROABS 4.6 5.1 5.6 4.9  --  7.0  HGB 11.6* 11.7* 12.2* 12.8* 13.1 12.0*  HCT 35.3* 36.6* 38.2* 39.1 39.1 38.0*  MCV 99.2 101.1* 101.3* 100.3* 96.5 102.4*  PLT  129* 136* 143* 152 177 198    Basic Metabolic Panel: Recent Labs  Lab 05/11/23 1000 05/12/23 0640 05/13/23 0510 05/14/23 0421 05/14/23 1823  NA 137 137 135 137 135  K 4.3 3.9 3.6 3.5 3.8  CL 99 98 97* 97* 95*  CO2 29 29 29 30 26   GLUCOSE 161* 152* 132* 134* 218*  BUN 52* 51* 55* 54* 58*  CREATININE 2.35* 2.34* 2.33* 2.42* 2.81*  CALCIUM 8.6* 8.5* 8.4* 8.6* 8.3*   GFR: Estimated Creatinine Clearance: 18.9 mL/min (A) (by C-G formula based on SCr of 2.81 mg/dL (H)). Recent Labs  Lab 05/11/23 2200 05/12/23 0640 05/13/23 0510 05/14/23 1823  WBC 7.1 6.5 7.6 9.2  LATICACIDVEN  --   --   --  4.3*    Liver Function Tests: Recent Labs  Lab 05/08/23 0421 05/14/23 0421 05/14/23 1823  AST 44* 25 41  ALT 6 <5 5  ALKPHOS 96 105 141*  BILITOT 1.6* 1.5* 1.5*  PROT 5.5* 6.5 6.4*  ALBUMIN 2.1* 2.2* 2.2*   No results for input(s): "LIPASE", "AMYLASE" in the last 168 hours. No results for input(s): "AMMONIA" in the last 168 hours.  ABG    Component Value Date/Time   O2SAT 63.5 05/11/2023 1025     Coagulation Profile: Recent Labs  Lab 05/10/23 0622 05/11/23 1000 05/12/23 0651 05/13/23 0510 05/14/23 0421  INR 4.4* 3.7* 4.2* 5.0* 2.8*    Cardiac Enzymes: Recent Labs  Lab 05/08/23 2019  CKTOTAL 160    HbA1C: Hemoglobin A1C  Date/Time Value Ref Range Status  10/23/2022 10:19 AM 5.6 4.0 - 5.6 % Final   Hgb A1c MFr Bld  Date/Time Value Ref Range Status  05/06/2023 04:24 AM 6.9 (H) 4.8 - 5.6 % Final    Comment:    (NOTE) Pre diabetes:          5.7%-6.4%  Diabetes:              >6.4%  Glycemic control for   <7.0% adults with diabetes   05/25/2022 09:41 AM 6.1 4.6 - 6.5 % Final    Comment:    Glycemic Control Guidelines for People with Diabetes:Non Diabetic:  <6%Goal of Therapy: <7%Additional Action Suggested:  >8%     CBG: Recent Labs  Lab 05/13/23 1713 05/13/23 2107 05/14/23 0733 05/14/23 1136 05/14/23 1550  GLUCAP 226* 163* 127* 220* 174*     Review of Systems:   UTA- patient intubated and sedated  Past Medical History:  He,  has a past medical history of Adenocarcinoma of colon (HCC) (06/19/2013), Anemia, CAD (coronary artery disease), Chronic kidney disease, stage 3a (HCC), Chronic systolic CHF (congestive heart failure) (HCC), Diabetes mellitus without complication (HCC), Essential hypertension, Hypertensive heart disease, Left inguinal hernia (05/2020), Mixed Ischemic and Non-ischemic Cardiomyopathy, Persistent atrial fibrillation (HCC), Pulmonary hypertension (HCC), Severe mitral regurgitation, and Tubular adenoma of colon.   Surgical History:   Past Surgical History:  Procedure Laterality Date   APPENDECTOMY N/A 06/19/2017   Location: [facility]; Surgeon: Claude Manges, MD   CARDIAC CATHETERIZATION Bilateral 02/24/2015   Procedure: Right/Left Heart Cath and Coronary Angiography;  Surgeon: Iran Ouch, MD;  Location[facility]  INVASIVE CV LAB;  Service: Cardiovascular;  Laterality: Bilateral;   CARDIAC CATHETERIZATION N/A 06/09/2015   Procedure: Coronary Stent Intervention (3.0 x 12 mm Xience Alpine DES to LCx);  Surgeon: Iran Ouch, MD;  Location: [facility] INVASIVE CV LAB;  Service: Cardiovascular;  Laterality: N/A   COLECTOMY  06/19/2017   Descending and proximal sigmoid colectomy, LEFT ureterolysis, partial cecectomy, appendectomy; Location: [facility]; Surgeon: Claude Manges, MD   COLONOSCOPY     COLONOSCOPY WITH PROPOFOL N/A 12/11/2016   Procedure: COLONOSCOPY WITH PROPOFOL;  Surgeon: Christena Deem, MD;  Location: [facility] ENDOSCOPY;  Service: Endoscopy;  Laterality: N/A;   ESOPHAGOGASTRODUODENOSCOPY     HERNIA REPAIR     PORT-A-CATH REMOVAL N/A 07/12/2014   Procedure: REMOVAL PORT-A-CATH;  Surgeon: Duwaine Maxin, MD;  Location: [facility] ORS;  Service: General;  Laterality: N/A;   PORTACATH PLACEMENT Left 2015   TEE WITHOUT CARDIOVERSION N/A 05/07/2023   Procedure: ECHOCARDIOGRAM, TRANSESOPHAGEAL;  Surgeon:  Laurey Morale, MD;  Location: [facility] ORS;  Service: Cardiovascular;  Laterality: N/A;     Social History:   reports that he has never smoked. He has never used smokeless tobacco. He reports that he does not currently use alcohol. He reports that he does not use drugs.   Family History:  His family history includes Cancer in his mother; Other in his father.   Allergies Allergies  Allergen Reactions   Shellfish Allergy Other (See Comments) and Hives    Pt. instructed by MD to avoid seafood     Home Medications  Prior to Admission medications   Medication Sig Start Date End Date Taking? Authorizing Provider  atorvastatin (LIPITOR) 40 MG tablet TAKE 1 TABLET(40 MG) BY MOUTH DAILY 12/21/22  Yes Iran Ouch, MD  carvedilol (COREG) 3.125 MG tablet TAKE 1 TABLET BY MOUTH 2 TIMES DAILY WITH A MEAL 03/25/23  Yes Iran Ouch, MD  cholecalciferol (VITAMIN D3) 25 MCG (1000 UNIT) tablet Take 1,000 Units by mouth daily.   Yes [provider]  FARXIGA 10 MG TABS tablet TAKE 1 TABLET(10 MG) BY MOUTH DAILY BEFORE BREAKFAST 12/26/22  Yes Bensimhon, Bevelyn Buckles, MD  ferrous sulfate 325 (65 FE) MG tablet Take 325 mg by mouth daily with breakfast.   Yes [provider]  gabapentin (NEURONTIN) 100 MG capsule Take 100 mg by mouth 3 (three) times daily. Taking 2 -100 mg pills in am and 1 -100 mg at night 04/12/21 05/14/23 Yes [provider]  loratadine (CLARITIN) 10 MG tablet TAKE 1 TABLET(10 MG) BY MOUTH DAILY 05/25/22  Yes Glori Luis, MD  Magnesium Oxide 400 MG CAPS Take 1 capsule (400 mg total) by mouth daily. 12/12/21  Yes Creig Hines, NP  torsemide (DEMADEX) 20 MG tablet Take 40 mg one day then 20 mg the next day, so continue to alternate 40mg  to 20mg  every other day as per cardio 05/16/23  Yes Charise Killian, MD  amiodarone (PACERONE) 200 MG tablet Take 1 tablet (200 mg total) by mouth 2 (two) times daily. 05/14/23 06/13/23  Charise Killian, MD   Blood Glucose Monitoring Suppl DEVI 1 each by Does not apply route daily. May substitute to any manufacturer covered by patient's insurance. 01/23/23   Glori Luis, MD  ceFAZolin (ANCEF) IVPB Inject 2 g into the vein every 12 (twelve) hours. Indication:  MSSA bacteremia and cervical spine abscess First Dose: Yes Last Day of Therapy:  06/14/2023 Labs - Once weekly:  CBC/D, CMP, ESR and CRP Fax  weekly lab results  promptly to [phone]  and [phone] Method of administration: IV Push Method of administration may be changed at the discretion of home infusion pharmacist based upon assessment of the patient and/or caregiver's ability to self-administer the medication ordered. Please pull PIC at completion of IV antibiotics Call [phone] with critical value or questions 05/14/23 06/17/23  Charise Killian, MD  Glucose Blood (BLOOD GLUCOSE TEST STRIPS) STRP 1 each by In Vitro route daily as needed. May substitute to any manufacturer covered by patient's insurance. 01/23/23   Glori Luis, MD  HYDROcodone-acetaminophen (NORCO/VICODIN) 5-325 MG tablet Take 1-2 tablets by mouth every 6 (six) hours as needed for up to 1 day for moderate pain (pain score 4-6) or severe pain (pain score 7-10). 05/14/23 05/15/23  Charise Killian, MD  Lancet Device MISC 1 each by Does not apply route daily as needed. May substitute to any manufacturer covered by patient's insurance. 01/23/23   Glori Luis, MD  Lancets Misc. MISC 1 each by Does not apply route daily as needed. May substitute to any manufacturer covered by patient's insurance. 01/23/23   Glori Luis, MD  spironolactone (ALDACTONE) 25 MG tablet Take 0.5 tablets (12.5 mg total) by mouth daily. Patient not taking: Reported on 05/14/2023 05/15/23 06/14/23  Charise Killian, MD  warfarin (COUMADIN) 5 MG tablet TAKE 1/2 TO 1 TABLET BY MOUTH EVERY DAY AS DIRECTED BY COUMADIN CLINIC Patient not taking: Reported on 05/14/2023 12/26/22    Iran Ouch, MD     Critical care time: 28 minutes      Betsey Holiday, AGACNP-BC Acute Care Nurse Practitioner [facility]   [phone] / [phone] Please see Amion for details.

## 2023-05-14 NOTE — ED Triage Notes (Signed)
 Pt arrives from patient elevator in asystole and apneic. Pt was being discharge from admission. Pt was on Horticulturist, commercial.

## 2023-05-14 NOTE — ED Provider Notes (Signed)
 [name] Provider Note    Event Date/Time   First MD Initiated Contact with Patient 05/14/23 1752     (approximate)   History   Cardiac Arrest   HPI  [name]. is a 78 y.o. male with history of CAD status post stenting, HFrEF, atrial fibrillation on warfarin, hypertension, hyperlipidemia, CKD, and colon cancer who presents with cardiac arrest.  The patient was admitted to the hospitalist service and just discharged this morning.  He was being transported out of the hospital to a nursing facility when he lost consciousness in the hallway and became apneic.  A CODE BLUE was called.  Dr. Gala Romney and Dr. Okey Dupre from cardiology responded initially and found the patient to be pulseless and started CPR.  Epinephrine was given.  Subsequent the patient was found to be in ventricular fibrillation and was defibrillated once.  He then was in asystole but subsequently had ROSC after another dose of epinephrine.  At that time he was moved to the ED.  The patient is unresponsive and unable to give any history himself.  I viewed the past medical records including the discharge summary from the patient's admission that ended today.  He initially was admitted after a fall and was eventually found to be in severe sepsis due to pneumonia and treated with IV antibiotics.  He also was found to have a cervical epidural abscess and was placed in a c-collar.  He was planned to be treated with IV cefazolin for 6 weeks via a PICC line.   Physical Exam   Triage Vital Signs: ED Triage Vitals  Encounter Vitals Group     BP 05/14/23 1745 (!) 150/84     Systolic BP Percentile --      Diastolic BP Percentile --      Pulse Rate 05/14/23 1743 (!) 103     Resp 05/14/23 1743 17     Temp --      Temp src --      SpO2 05/14/23 1747 97 %     Weight --      Height --      Head Circumference --      Peak Flow --      Pain Score --      Pain Loc --      Pain Education --       Exclude from Growth Chart --     Most recent vital signs: Vitals:   05/14/23 2155 05/14/23 2200  BP: (!) 149/85 (!) 152/80  Pulse: 71 70  Resp: (!) 26 20  Temp:  99 F (37.2 C)  SpO2: 98% 98%     General: Unresponsive. CV:  Good peripheral perfusion.  Resp:  Lungs clear to auscultation bilaterally. Abd:  No distention.  Other:  Some spontaneous respirations after ROSC.  PERRLA.   ED Results / Procedures / Treatments   Labs (all labs ordered are listed, but only abnormal results are displayed) Labs Reviewed  COMPREHENSIVE METABOLIC PANEL WITH GFR - Abnormal; Notable for the following components:      Result Value   Chloride 95 (*)    Glucose, Bld 218 (*)    BUN 58 (*)    Creatinine, Ser 2.81 (*)    Calcium 8.3 (*)    Total Protein 6.4 (*)    Albumin 2.2 (*)    Alkaline Phosphatase 141 (*)    Total Bilirubin 1.5 (*)    GFR, Estimated 23 (*)  All other components within normal limits  CBC WITH DIFFERENTIAL/PLATELET - Abnormal; Notable for the following components:   RBC 3.71 (*)    Hemoglobin 12.0 (*)    HCT 38.0 (*)    MCV 102.4 (*)    RDW 16.1 (*)    All other components within normal limits  LACTIC ACID, PLASMA - Abnormal; Notable for the following components:   Lactic Acid, Venous 4.3 (*)    All other components within normal limits  BRAIN NATRIURETIC PEPTIDE - Abnormal; Notable for the following components:   B Natriuretic Peptide 2,694.0 (*)    All other components within normal limits  PROTIME-INR - Abnormal; Notable for the following components:   Prothrombin Time 25.1 (*)    INR 2.3 (*)    All other components within normal limits  BLOOD GAS, ARTERIAL - Abnormal; Notable for the following components:   pH, Arterial 7.5 (*)    pO2, Arterial 285 (*)    Bicarbonate 32.8 (*)    Acid-Base Excess 8.7 (*)    All other components within normal limits  GLUCOSE, CAPILLARY - Abnormal; Notable for the following components:   Glucose-Capillary 229 (*)     All other components within normal limits  TROPONIN I (HIGH SENSITIVITY) - Abnormal; Notable for the following components:   Troponin I (High Sensitivity) 52 (*)    All other components within normal limits  TROPONIN I (HIGH SENSITIVITY) - Abnormal; Notable for the following components:   Troponin I (High Sensitivity) 160 (*)    All other components within normal limits  MRSA NEXT GEN BY PCR, NASAL  LACTIC ACID, PLASMA  CBC  MAGNESIUM  PHOSPHORUS  COMPREHENSIVE METABOLIC PANEL WITH GFR  PROTIME-INR     EKG  ED ECG REPORT I, Dionne Bucy, the attending physician, personally viewed and interpreted this ECG.  Date: 05/14/2023 EKG Time: 1744 Rate: 95 Rhythm: Atrial fibrillation QRS Axis: normal Intervals: normal ST/T Wave abnormalities: Nonspecific ST abnormalities Narrative Interpretation: no evidence of acute ischemia    RADIOLOGY  Chest x-ray: I independently viewed and interpreted the images; ET tube is in adequate position with the tip above the carina.   PROCEDURES:  Critical Care performed: Yes, see critical care procedure note(s)  .Critical Care  Performed by: Dionne Bucy, MD Authorized by: Dionne Bucy, MD   Critical care provider statement:    Critical care time (minutes):  60   Critical care time was exclusive of:  Separately billable procedures and treating other patients   Critical care was necessary to treat or prevent imminent or life-threatening deterioration of the following conditions:  Cardiac failure and respiratory failure   Critical care was time spent personally by me on the following activities:  Development of treatment plan with patient or surrogate, discussions with consultants, evaluation of patient's response to treatment, examination of patient, ordering and review of laboratory studies, ordering and review of radiographic studies, ordering and performing treatments and interventions, pulse oximetry, re-evaluation of  patient's condition, review of old charts, obtaining history from patient or surrogate and ventilator management   Care discussed with: admitting provider   Procedure Name: Intubation Date/Time: 05/14/2023 7:28 PM  Performed by: Dionne Bucy, MDPre-anesthesia Checklist: Patient identified, Emergency Drugs available, Suction available and Patient being monitored Oxygen Delivery Method: Ambu bag Preoxygenation: Pre-oxygenation with 100% oxygen Induction Type: IV induction and Rapid sequence Laryngoscope Size: Glidescope Grade View: Grade I Tube size: 7.5 mm Number of attempts: 1 Airway Equipment and Method: Video-laryngoscopy Placement Confirmation: ETT inserted through  vocal cords under direct vision, CO2 detector and Breath sounds checked- equal and bilateral Secured at: 24 cm Tube secured with: ETT holder Dental Injury: Teeth and Oropharynx as per pre-operative assessment        MEDICATIONS ORDERED IN ED: Medications  fentaNYL in NS (43mcg/ml) infusion-PREMIX (100 mcg/hr Intravenous Infusion Verify 05/14/23 2150)  midazolam (VERSED) 100 mg/100 mL (1 mg/mL) premix infusion (0 mg/hr Intravenous Stopped 05/14/23 2148)  vasopressin (PITRESSIN) 20 Units in 100 mL (0.2 unit/mL) infusion-*FOR SHOCK* (0 Units/min Intravenous Hold 05/14/23 1936)  polyethylene glycol (MIRALAX / GLYCOLAX) packet 17 g (has no administration in time range)  famotidine (PEPCID) tablet 20 mg (has no administration in time range)  docusate (COLACE) 50 MG/5ML liquid 100 mg (has no administration in time range)  polyethylene glycol (MIRALAX / GLYCOLAX) packet 17 g (has no administration in time range)  Oral care mouth rinse (has no administration in time range)  Oral care mouth rinse (has no administration in time range)  Chlorhexidine Gluconate Cloth 2 % PADS 6 each (6 each Topical Given 05/14/23 2133)  insulin aspart (novoLOG) injection 0-9 Units (3 Units Subcutaneous Given 05/14/23 2137)  ceFAZolin  (ANCEF) IVPB 2g/100 mL premix (has no administration in time range)  norepinephrine (LEVOPHED) 16 mg in (0.064 mg/mL) premix infusion (29 mcg/min Intravenous Infusion Verify 05/14/23 2150)  0.9 %  sodium chloride infusion ( Intravenous New Bag/Given 05/14/23 2157)  etomidate (AMIDATE) injection 20 mg (20 mg Intravenous Given 05/14/23 1748)  succinylcholine (ANECTINE) syringe 100 mg (100 mg Intravenous Given 05/14/23 1748)  EPINEPHrine (ADRENALIN) 1 MG/10ML injection (1 mg Intravenous Given 05/14/23 1804)  0.9 %  sodium chloride infusion (0 mLs Intravenous Stopped 05/14/23 2135)  midazolam (VERSED) 5 MG/5ML injection 4 mg (4 mg Intravenous Given 05/14/23 1812)     IMPRESSION / MDM / ASSESSMENT AND PLAN / ED COURSE  I reviewed the triage vital signs and the nursing notes.  78 year old male with PMH as noted above was in the process of being transported out from the hospital after being discharged from inpatient stay when he became unresponsive and was found to be in cardiac arrest.  I responded to the CODE BLUE just as CPR was being started.  The patient was found to be in ventricular fibrillation, got defibrillated once, subsequently was in asystole, got 2 rounds of epi, and had ROSC.  At that time he was moved to the emergency department.  Differential diagnosis includes, but is not limited to, cardiac dysrhythmia, CHF, acute MI, sepsis, electrolyte abnormality.  On arrival to the ED the patient still was obtunded with some respiratory effort but no ability to protect his airway.  He was intubated via the glide scope on the first attempt without difficulty.  Approximately 15 to 20 minutes after arriving to the ED he became hypotensive and then lost pulses again, in PEA with the monitor showing a regular rhythm.  CPR was initiated, he got 1 dose of epinephrine, and had ROSC after few minutes.  He remained hypotensive so was started on a Levophed infusion.  Chest x-ray confirms good tube placement.  We will  obtain lab workup, continue pressors, consult the ICU, and reassess.  Patient's presentation is most consistent with acute presentation with potential threat to life or bodily function.  The patient is on the cardiac monitor to evaluate for evidence of arrhythmia and/or significant heart rate changes.  ----------------------------------------- 7:34 PM on 05/14/2023 -----------------------------------------  The patient briefly remained hypotensive even once the Levophed  was at its maximum dose.  It is being infused through a PICC.  We considered starting additional pressors, however the patient's blood pressure has stabilized.  Lab workup is significant for a lactate of 4.3.  This is likely due to ischemia/hypoxia with the cardiac arrest.  There is no clinical evidence for sepsis.  The patient has severe CHF.  There is no indication for fluids at this time.  BNP is 2600.  Initial troponin is 52.  CMP shows stable renal function.  I consulted Dr. Belia Heman from the ICU and discussed the case with him; he agrees with the current management and states the ICU team will evaluate the patient for admission.  I also updated the wife via phone on the patient's current condition.    FINAL CLINICAL IMPRESSION(S) / ED DIAGNOSES   Final diagnoses:  Cardiac arrest (HCC)  Hypotension, unspecified hypotension type     Rx / DC Orders   ED Discharge Orders     None        Note:  This document was prepared using Dragon voice recognition software and may include unintentional dictation errors.    Dionne Bucy, MD 05/14/23 2213

## 2023-05-14 NOTE — Progress Notes (Signed)
 eLink Physician-Brief Progress Note Patient Name: [name]. DOB: September 08, 1945 MRN: [id number]   Date of Service  05/14/2023  HPI/Events of Note  Patient re-admitted following cardiac arrest as he was being discharged from the hospital.  eICU Interventions  New Patient Evaluation.        Migdalia Dk 05/14/2023, 9:41 PM

## 2023-05-14 NOTE — Progress Notes (Signed)
 Patient ID: [name]., male   DOB: 1945/03/12, 78 y.o.   MRN: [id number]     Advanced Heart Failure Rounding Note  Cardiologist: Lorine Bears, MD  Chief Complaint: CHF Subjective:    4/1 : TEE - no endocarditis. DC-CV - conversion to SR.   Remains in NSR. Feels ok. Denies CP or SOB. Scr stable 2.3 -3.4  INR 2.8 No bleeding  Has SNF bed waiting   Objective:   Weight Range: 59.8 kg Body mass index is 19.47 kg/m.   Vital Signs:   Temp:  [97.8 F (36.6 C)-98.4 F (36.9 C)] 98.3 F (36.8 C) (04/08 1106) Pulse Rate:  [54-65] 63 (04/08 1106) Resp:  [17-24] 17 (04/08 1106) BP: (108-126)/(45-59) 115/50 (04/08 1106) SpO2:  [95 %-100 %] 97 % (04/08 1106) Weight:  [59.8 kg] 59.8 kg (04/08 0502) Last BM Date : 05/12/23  Weight change: Filed Weights   05/12/23 0500 05/13/23 0534 05/14/23 0502  Weight: 66.7 kg 65.5 kg 59.8 kg    Intake/Output:   Intake/Output Summary (Last 24 hours) at 05/14/2023 1126 Last data filed at 05/14/2023 0900 Gross per 24 hour  Intake 0 ml  Output 1500 ml  Net -1500 ml     Physical Exam    General:  Lying in bed in neck collar HEENT: normal + neck collar Neck: supple. no JVD. Carotids 2+ bilat; no bruits. No lymphadenopathy or thryomegaly appreciated. Cor Regular rate & rhythm. No rubs, gallops or murmurs. Lungs: clear Abdomen: soft, nontender, nondistended. No hepatosplenomegaly. No bruits or masses. Good bowel sounds. Extremities: no cyanosis, clubbing, rash, edema Neuro: alert & orientedx3, cranial nerves grossly intact. moves all 4 extremities w/o difficulty. Affect pleasant    Telemetry   Sinus brady 50-60s Personally reviewed   Labs    CBC Recent Labs    05/11/23 2200 05/12/23 0640 05/13/23 0510  WBC 7.1 6.5 7.6  NEUTROABS 5.6 4.9  --   HGB 12.2* 12.8* 13.1  HCT 38.2* 39.1 39.1  MCV 101.3* 100.3* 96.5  PLT 143* 152 177   Basic Metabolic Panel Recent Labs    40/98/11 0510 05/14/23 0421  NA 135 137  K 3.6  3.5  CL 97* 97*  CO2 29 30  GLUCOSE 132* 134*  BUN 55* 54*  CREATININE 2.33* 2.42*  CALCIUM 8.4* 8.6*   Liver Function Tests Recent Labs    05/14/23 0421  AST 25  ALT <5  ALKPHOS 105  BILITOT 1.5*  PROT 6.5  ALBUMIN 2.2*    No results for input(s): "LIPASE", "AMYLASE" in the last 72 hours. Cardiac Enzymes No results for input(s): "CKTOTAL", "CKMB", "CKMBINDEX", "TROPONINI" in the last 72 hours.   BNP: BNP (last 3 results) Recent Labs    08/21/22 1142 12/13/22 1140 04/16/23 1541  BNP 791.1* 2,083.6* 1,510.6*    ProBNP (last 3 results) No results for input(s): "PROBNP" in the last 8760 hours.   D-Dimer No results for input(s): "DDIMER" in the last 72 hours. Hemoglobin A1C No results for input(s): "HGBA1C" in the last 72 hours.  Fasting Lipid Panel No results for input(s): "CHOL", "HDL", "LDLCALC", "TRIG", "CHOLHDL", "LDLDIRECT" in the last 72 hours. Thyroid Function Tests No results for input(s): "TSH", "T4TOTAL", "T3FREE", "THYROIDAB" in the last 72 hours.  Invalid input(s): "FREET3"  Other results:   Imaging    No results found.    Medications:     Scheduled Medications:  amiodarone  200 mg Oral BID   atorvastatin  40 mg Oral Daily  carvedilol  3.125 mg Oral BID WC   Chlorhexidine Gluconate Cloth  6 each Topical Daily   dapagliflozin propanediol  10 mg Oral Daily   feeding supplement  237 mL Oral BID BM   guaiFENesin  600 mg Oral BID   hydrocerin   Topical BID   insulin aspart  0-5 Units Subcutaneous QHS   insulin aspart  0-9 Units Subcutaneous TID WC   mupirocin ointment   Nasal BID   mouth rinse  15 mL Mouth Rinse 4 times per day   pneumococcal 20-valent conjugate vaccine  0.5 mL Intramuscular Tomorrow-1000   sodium chloride flush  10-40 mL Intracatheter Q12H   spironolactone  12.5 mg Oral Daily   [START ON 05/16/2023] torsemide  20 mg Oral QODAY   [START ON 05/15/2023] torsemide  40 mg Oral QODAY   Warfarin - Pharmacist Dosing  Inpatient   Does not apply q1600    Infusions:   ceFAZolin (ANCEF) IV 2 g (05/14/23 0912)    PRN Medications: acetaminophen **OR** acetaminophen, albuterol, alum & mag hydroxide-simeth, HYDROcodone-acetaminophen, lactulose, LORazepam, ondansetron **OR** ondansetron (ZOFRAN) IV, mouth rinse, sodium chloride flush     Assessment/Plan   1. Acute on chronic systolic CHF: With mixed cardiogenic/septic shock initially, now resolved. Has not been on pressors.  Last lactate 1.2.  Patient has history of primarily nonischemic cardiomyopathy.  He has refused ICD.  He has had trouble getting his HF medications.  Echo this admission was unchanged from past with EF 20-25%, global HK, moderate LVH, severe RV dysfunction, mild-moderate MR, moderate-severe TR, IVC dilated. He looks dry on exam .Creatinine 2.13 => 2.2 -> 2.3 -> 2.4 today.  - Volume status ok - Continue torsemide 40 every other day alternating with 20 every other day  - Continue spironolactone 12.5 daily.  - Continue Farxiga 10 daily.  - Continue Coreg 3.125 mg bid.  2. MSSA bacteremia: Source is likely LLE wound (growing MSSA), also has C4/C5 prevertebral collection concerning for discitis. TEE with no endocarditis.  ID following.  - Continue cefazolin x 6 wks.  - No change 3. AKI on CKD stage 3: Baseline creatinine around 1.7.  Creatinine 2.57 => 2.4 => 2.13 => 2.2 -> 2.3 -. 2.4.  Suspect due to septic shock with cardiorenal component.  4. Atrial fibrillation: Persistent.  He was in atrial fibrillation at last outpatient appt but was in NSR prior to that. S/P DC-CV with conversion to SR. Remains in NSR today.  - Decrease amio to 200 bid - INR 2.8. PharmD suggesting possible change to apixaban 2.5 bid (I think this would be safer for him)  5. Elevated CPK: Mild rhabdomyolysis due to fall, peak CK 2978.  6. Chronic LLE wound: Suspect source for MSSA bacteremia.  - improving 7. Elevated LFTs: Suspect shock liver, trending down.  -  Continue statin. 8. H/o colon cancer 9. C-spine prevertebral edema: Upper c-spine with edema in the C4-C5 disc, ?infectious. No drainable fluid collection per IR.  - He is in a c-spine collar.  - Neurosurgery following, no plan for surgery.   He is stable for d/c to SNF from a cardiac standpoint.  Cardiac meds for d/c  Amio 200 bid Torsemide 40 every other day with 20 every other day Farxiga 10  Carvedilol 3.125 bid Cleda Daub 12,5  Warfarin INR 2.0-3.0  (strongly recommend considering change to Eliquis 2.5 bid if he can afford)   Will arrange outpatient HF f/u   Length of Stay: 11  Arvilla Meres, MD  05/14/2023, 11:26 AM  Advanced Heart Failure Team Pager [phone] (M-F; 7a - 5p)  Please contact [facility] for night-coverage after hours (5p -7a ) and weekends on [email or web address]

## 2023-05-14 NOTE — Progress Notes (Signed)
 Physical Therapy Treatment Patient Details Name: [name]. MRN: [id number] DOB: 11-Oct-1945 Today's Date: 05/14/2023   History of Present Illness Patient is a 78 year old male found down at home. Found to have severe sepsis, acute on chronic systolic CHF, MSSA bacteremia, atrial fibrillation s/p cardioversion, C-spine prevertebral edema    PT Comments  Patient seen in conjunction with OT to maximize patient's tolerance. Reluctant to participate but agreeable with encouragement. Required modA+2 to stand from EOB with RW and able to complete sit to stand x 5 with similar assist. Ambulated ~6' x 2 with modA+2 and RW due to posterior bias and assist for RW management. Educated patient on use of incentive spirometer with patient able to demonstrate understanding with low volume of inspiration. Discharge plan remains appropriate.     If plan is discharge home, recommend the following: Two people to help with walking and/or transfers;A lot of help with bathing/dressing/bathroom;Assist for transportation;Help with stairs or ramp for entrance;Assistance with cooking/housework   Can travel by private vehicle     No  Equipment Recommendations  Rolling Bastien Strawser (2 wheels);Wheelchair (measurements PT);Hospital bed;BSC/3in1 (if going home)    Recommendations for Other Services       Precautions / Restrictions Precautions Precautions: Fall;Cervical Precaution Booklet Issued: No Recall of Precautions/Restrictions: Impaired Required Braces or Orthoses: Cervical Brace Cervical Brace: Hard collar Restrictions Weight Bearing Restrictions Per Provider Order: No     Mobility  Bed Mobility Overal bed mobility: Needs Assistance Bed Mobility: Supine to Sit, Sit to Supine Rolling: Mod assist   Supine to sit: Min assist, Used rails Sit to supine: Mod assist        Transfers Overall transfer level: Needs assistance Equipment used: Rolling Dino Borntreger (2 wheels) Transfers: Sit to/from Stand Sit to  Stand: Mod assist, +2 physical assistance, +2 safety/equipment           General transfer comment: Pt required MODA+2 for STS from EOB with RW. Completed sit to stand x 5 with similar assist each stand    Ambulation/Gait Ambulation/Gait assistance: Mod assist, +2 physical assistance, +2 safety/equipment Gait Distance (Feet): 6 Feet (+6') Assistive device: Rolling Egor Fullilove (2 wheels) Gait Pattern/deviations: Step-to pattern, Decreased stride length, Narrow base of support       General Gait Details: assist for RW management and balacne due to posterior bias. able to complete 6' x 2 fwd/bwd   Stairs             Wheelchair Mobility     Tilt Bed    Modified Rankin (Stroke Patients Only)       Balance Overall balance assessment: Needs assistance Sitting-balance support: Feet supported Sitting balance-[name]: Good Sitting balance - Comments: Good reaching outside BOS Postural control: Posterior lean Standing balance support: Bilateral upper extremity supported Standing balance-[name]: Poor Standing balance comment: heavy reliant on external supports                            Communication Communication Communication: No apparent difficulties Factors Affecting Communication: Difficulty expressing self  Cognition Arousal: Alert Behavior During Therapy: WFL for tasks assessed/performed   PT - Cognitive impairments: No family/caregiver present to determine baseline                       PT - Cognition Comments: poor awareness into current deficits Following commands: Intact Following commands impaired: Follows one step commands with increased time    Cueing  Cueing Techniques: Verbal cues, Tactile cues  Exercises Other Exercises Other Exercises: BUE dynamic reaching exercises while pt seated on EOB Other Exercises: sit to stand x 5 with modA+2    General Comments General comments (skin integrity, edema, etc.): Encouragement  required to participate, good effort throughout      Pertinent Vitals/Pain Pain Assessment Pain Assessment: No/denies pain    Home Living                          Prior Function            PT Goals (current goals can now be found in the care plan section) Acute Rehab PT Goals PT Goal Formulation: With patient Time For Goal Achievement: 05/22/23 Potential to Achieve Goals: Fair Progress towards PT goals: Progressing toward goals    Frequency    Min 3X/week      PT Plan      Co-evaluation PT/OT/SLP Co-Evaluation/Treatment: Yes Reason for Co-Treatment: For patient/therapist safety;To address functional/ADL transfers PT goals addressed during session: Balance;Mobility/safety with mobility OT goals addressed during session: ADL's and self-care      AM-PAC PT "6 Clicks" Mobility   Outcome Measure  Help needed turning from your back to your side while in a flat bed without using bedrails?: A Lot Help needed moving from lying on your back to sitting on the side of a flat bed without using bedrails?: A Lot Help needed moving to and from a bed to a chair (including a wheelchair)?: A Lot Help needed standing up from a chair using your arms (e.g., wheelchair or bedside chair)?: Total Help needed to walk in hospital room?: Total Help needed climbing 3-5 steps with a railing? : Total 6 Click Score: 9    End of Session Equipment Utilized During Treatment: Cervical collar Activity Tolerance: Patient tolerated treatment well;No increased pain Patient left: in bed;with call bell/phone within reach;with bed alarm set Nurse Communication: Mobility status PT Visit Diagnosis: Unsteadiness on feet (R26.81);Muscle weakness (generalized) (M62.81)     Time: 4098-1191 PT Time Calculation (min) (ACUTE ONLY): 23 min  Charges:    $Therapeutic Activity: 8-22 mins PT General Charges $$ ACUTE PT VISIT: 1 Visit                     Maylon Peppers, PT, DPT Physical  Therapist - [facility]  [facility]    Ketih Goodie A Wenzel Backlund 05/14/2023, 12:19 PM

## 2023-05-14 NOTE — Progress Notes (Signed)
 Patient discharging to SNF today via em[facility] services, report called to nicole at [facility]. All questions answered. Patient to keep PICC line to continue IV abt at snf. Discharge instructions verbalized to patient and put in packet, no comments or concerns noted. Will continue to [facility].

## 2023-05-14 NOTE — TOC Progression Note (Signed)
 Transition of Care (TOC) - Progression Note    Patient Details  Name: [name]. MRN: [id number] Date of Birth: 05-01-1945  Transition of Care [facility]) CM/SW Contact  Margarito Liner, LCSW Phone Number: 05/14/2023, 11:09 AM  Clinical Narrative:   SNF auth approved: [id number]. Valid for 7 days. MD and SNF admissions assistant are aware.  Expected Discharge Plan: Home w Home Health Services Barriers to Discharge: Continued Medical Work up  Expected Discharge Plan and Services     Post Acute Care Choice: Home Health, Durable Medical Equipment Living arrangements for the past 2 months: Single Family Home                                       Social Determinants of Health (SDOH) Interventions SDOH Screenings   Food Insecurity: No Food Insecurity (05/04/2023)  Housing: Low Risk  (05/04/2023)  Transportation Needs: No Transportation Needs (05/04/2023)  Utilities: Not At Risk (05/04/2023)  Alcohol Screen: Low Risk  (10/17/2022)  Depression (PHQ2-9): Low Risk  (01/23/2023)  Financial Resource Strain: Low Risk  (10/17/2022)  Physical Activity: Sufficiently Active (10/17/2022)  Social Connections: Moderately Isolated (05/04/2023)  Stress: No Stress Concern Present (10/17/2022)  Tobacco Use: Low Risk  (05/04/2023)  Health Literacy: Inadequate Health Literacy (10/17/2022)    Readmission Risk Interventions    05/09/2023   12:59 PM  Readmission Risk Prevention Plan  PCP or Specialist Appt within 3-5 Days Complete  Social Work Consult for Recovery Care Planning/Counseling Complete  Palliative Care Screening Not Applicable

## 2023-05-14 NOTE — Discharge Summary (Addendum)
 Physician Discharge Summary  Christopher Sark. UEA:[id number] DOB: [birth year] DOA: 05/03/2023  PCP: Glori Luis, MD (Inactive)  Admit date: 05/03/2023 Discharge date: 05/14/2023  Admitted From: home  Disposition:  SNF  Recommendations for Outpatient Follow-up:  Follow up with PCP in 1-2 weeks F/u w/ cadio, Dr. Kittie Plater, in 1-2 weeks F/u w/ ID, Dr. Rivka Safer, 06/04/23 at 10.45AM   Home Health:  no  Equipment/Devices:  Discharge Condition: stable  CODE STATUS: full  Diet recommendation: Heart Healthy / Carb Modified  Brief/Interim Summary: HPI was taken from Dr. Para March:  Christopher Sark. is a 78 y.o. male with medical history significant for CAD s/p stenting, chronic HFrEF secondary to NICM (EF 20 to 25% 1/17), persistent A-fib on warfarin due to cost of Eliquis, HTN, HLD, CKD 3B, and colon cancer 2015, who is being admitted with multiple acute derangements, related to a fall off his bed with no recollection of the circumstances.  Per wife at bedside, she left patient in bed this morning and when she returned home at 10 AM patient was lying on the floor on his stomach with his head turned to the side.  She states he was awake and alert.  He was unable to get up.  At baseline he does not walk with a cane or walker and typically he would have been able to get up on his own.  Patient states he had been feeling weak for a couple days prior to the episode.  He was otherwise at his baseline.  He has chronic neck pain which is no worse than his baseline.  He has chronic numbness in his legs related to chemotherapy for colon cancer which is at his baseline.  He denies one-sided weakness and denies new numbness.  He denies chest pain, shortness of breath, cough, fever or chills, dysuria, abdominal pain, nausea vomiting or change in bowel habits.   Denies headache, visual disturbance.  Chart review reveals he last saw his cardiologist on 3/11 when he was noted to be volume up, with no symptoms of  angina at the time.  No changes were made to his GDMT.  He reiterated that he was not interested in ICD and that Sherryll Burger was cost prohibitive, so he remained on losartan.  Torsemide remained at the same dose as he said swelling was improving. ED course and data review: Tachycardic to 121 with soft blood pressure of 105/75 and otherwise normal vitals Labs notable for WBC 14,700With with lactic acid 4.4 Troponin 342, BNP pending Creatinine 2.89 up from baseline of 1.7 with anion gap of 18 Abnormal LFTs with AST/ALT of 301/282, total bilirubin 4.1.  Lipase at 100 Coagulopathy with INR 5.6, platelets 1 24,000 CK 2978 Respiratory viral panel negative for COVID flu and RSV Urinalysis negative for infection CT abdomen and pelvis with no acute abnormalities Chest x-ray showing left lower lobe consolidation likely atelectasis, pneumonia not ruled out CT C-spine nontraumatic but concerning for prevertebral fluid with recommendations for follow-up MRI to evaluate for osteomyelitis discitis   The ED provider spoke with GI given elevated LFTs and lipase however acute biliary tract disease was thought unlikely given normal alk phos.  Opines that likely related to sepsis MRI ordered for further evaluation of abnormality on CT C spine Patient started on cefepime vancomycin and azithromycin for sepsis of unknown source possibly respiratory and given sepsis fluid bolus   Being maintained in c-collar pending MRI   Hospitalist consulted for admission.   Discharge Diagnoses:  Principal  Problem:   Severe sepsis (HCC) Active Problems:   Pneumonia, possible   Syncope, presumed   Abnormal MRI, cervical spine   CAD S/P percutaneous coronary angioplasty   Elevated troponin   Abnormal LFTs--possible biliary tract disease   Acute renal failure superimposed on stage 3b chronic kidney disease (HCC)   Atrial fibrillation with rapid ventricular response (HCC)   Chronic HFrEF (heart failure with reduced ejection  fraction) (HCC)   Traumatic rhabdomyolysis (HCC)   Accidental fall from bed, initial encounter   Thrombocytopenia (HCC)   Supratherapeutic INR   History of colon cancer   Acute ST elevation myocardial infarction (STEMI) of inferior wall (HCC)   MSSA bacteremia   Non-traumatic rhabdomyolysis   Spinal epidural abscess   Persistent atrial fibrillation (HCC)   NICM (nonischemic cardiomyopathy) (HCC)  Severe sepsis: see Dr. Adria Devon notes on how pt met severe sepsis. Severe sepsis resolved   Pneumonia: continue on IV cefazolin, bronchodilators & encourage incentive spirometry   MSSA bacteremia: likely secondary to cervical epidural abscess. No drainable fluid in cervical area. Continue w/ c-collar as per neuro surg.  Continue on IV cefazolin x 6 weeks (end date on 06/14/23) as per ID. TEE was neg for endocarditis.   Likely syncope: etiology unclear. Continue on tele    Transaminitis: etiology unclear, possibly secondary to severe sepsis. US showed cholelithiasis with no evidence of acute cholecystitis  Hx of CAD: s/p percutaneous coronary angioplasty. W/ elevated troponins likely secondary to demand ischemia    Likely PAF: w/ RVR. Continue on amio as per cardio. Cardio following and recs apprec    AKI on CKDIIIb: Cr is labile. Avoid nephrotoxic meds   Traumatic rhabdomyolysis: secondary to fall from bed.    Acute on chronic systolic CHF: continue on coreg, torsemide, aldactone & farxiga as per cardio. Monitor I/Os    Supratherapeutic INR: s/p vitamin K x 2. INR is back within therapeutic range. Monitor INR daily   Thrombocytopenia: resolved    Hx of colon cancer: management per onco outpatient  Discharge Instructions  Discharge Instructions     [facility] pharmacist to adjust dose for Vancomycin, Aminoglycosides and other anti-infective therapies as requested by physician.   Complete by: As directed    [facility] to provide Cath Flo 2mg    Complete by: As  directed    Administer for PICC line occlusion and as ordered by physician for other access device issues.   Anaphylaxis Kit: Provided to treat any anaphylactic reaction to the medication being provided to the patient if First Dose or when requested by physician   Complete by: As directed    Epinephrine 1mg /ml vial / amp: Administer 0.3mg  (0.90ml) subcutaneously once for moderate to severe anaphylaxis, nurse to call physician and pharmacy when reaction occurs and call 911 if needed for immediate care   Diphenhydramine 50mg /ml IV vial: Administer 25-50mg  IV/IM PRN for first dose reaction, rash, itching, mild reaction, nurse to call physician and pharmacy when reaction occurs   Sodium Chloride 0.9% NS IV: Administer if needed for hypovolemic blood pressure drop or as ordered by physician after call to physician with anaphylactic reaction   Change dressing on IV access line weekly and PRN   Complete by: As directed    Diet - low sodium heart healthy   Complete by: As directed    Diet Carb Modified   Complete by: As directed    Discharge instructions   Complete by: As directed    F/u w/ PCP  in 1-2 weeks. F/u w/ cardio, Dr. Gala Romney, in 1-2 weeks. F/u w/ ID, Dr. Rivka Safer, 06/04/23 at 10.45AM   Discharge wound care:   Complete by: As directed    Wound care  Daily      Comments: Cleanse L lower leg/ankle/foot wounds with Vashe wound cleanser [place] [phone]), do not rinse and allow to air dry. Apply silver hydrofiber [place] [phone]) to wound beds daily, cover with silicone foam.  Apply Eucerin to intact skin. Wrap with Kerlix roll gauze beginning right above toes and ending right below knee. Cover with Ace bandage wrapped in same fashion as Kerlix for light compression.   Flush IV access with Sodium Chloride 0.9% and Heparin 10 units/ml or 100 units/ml   Complete by: As directed    Home infusion instructions - [facility]   Complete by: As directed    Instructions: Flush IV  access with Sodium Chloride 0.9% and Heparin 10units/ml or 100units/ml   Change dressing on IV access line: Weekly and PRN   Instructions Cath Flo 2mg : Administer for PICC Line occlusion and as ordered by physician for other access device   [facility] pharmacist to adjust dose for: Vancomycin, Aminoglycosides and other anti-infective therapies as requested by physician   Increase activity slowly   Complete by: As directed    Method of administration may be changed at the discretion of home infusion pharmacist based upon assessment of the patient and/or caregiver's ability to self-administer the medication ordered   Complete by: As directed       Allergies as of 05/14/2023       Reactions   Shellfish Allergy Other (See Comments), Hives   Pt. instructed by MD to avoid seafood        Medication List     STOP taking these medications    losartan 100 MG tablet Commonly known as: COZAAR   metFORMIN 500 MG tablet Commonly known as: GLUCOPHAGE       TAKE these medications    amiodarone 200 MG tablet Commonly known as: PACERONE Take 1 tablet (200 mg total) by mouth 2 (two) times daily.   atorvastatin 40 MG tablet Commonly known as: LIPITOR TAKE 1 TABLET(40 MG) BY MOUTH DAILY   Blood Glucose Monitoring Suppl Devi 1 each by Does not apply route daily. May substitute to any manufacturer covered by patient's insurance.   BLOOD GLUCOSE TEST STRIPS Strp 1 each by In Vitro route daily as needed. May substitute to any manufacturer covered by patient's insurance.   carvedilol 3.125 MG tablet Commonly known as: COREG TAKE 1 TABLET BY MOUTH 2 TIMES DAILY WITH A MEAL   ceFAZolin IVPB Commonly known as: ANCEF Inject 2 g into the vein every 12 (twelve) hours. Indication:  MSSA bacteremia and cervical spine abscess First Dose: Yes Last Day of Therapy:  06/14/2023 Labs - Once weekly:  CBC/D, CMP, ESR and CRP Fax weekly lab results  promptly to [phone]  and [phone] Method of administration: IV Push Method of administration may be changed at the discretion of home infusion pharmacist based upon assessment of the patient and/or caregiver's ability to self-administer the medication ordered. Please pull PIC at completion of IV antibiotics Call [phone] with critical value or questions   cholecalciferol 25 MCG (1000 UNIT) tablet Commonly known as: VITAMIN D3 Take 1,000 Units by mouth daily.   Farxiga 10 MG Tabs tablet Generic drug: dapagliflozin propanediol TAKE 1 TABLET(10 MG) BY MOUTH DAILY BEFORE BREAKFAST  ferrous sulfate 325 (65 FE) MG tablet Take 325 mg by mouth daily with breakfast.   gabapentin 100 MG capsule Commonly known as: NEURONTIN Take 100 mg by mouth 3 (three) times daily. Taking 2 -100 mg pills in am and 1 -100 mg at night   HYDROcodone-acetaminophen 5-325 MG tablet Commonly known as: NORCO/VICODIN Take 1-2 tablets by mouth every 6 (six) hours as needed for up to 1 day for moderate pain (pain score 4-6) or severe pain (pain score 7-10).   Lancet Device Misc 1 each by Does not apply route daily as needed. May substitute to any manufacturer covered by patient's insurance.   Lancets Misc. Misc 1 each by Does not apply route daily as needed. May substitute to any manufacturer covered by patient's insurance.   loratadine 10 MG tablet Commonly known as: CLARITIN TAKE 1 TABLET(10 MG) BY MOUTH DAILY   Magnesium Oxide 400 MG Caps Take 1 capsule (400 mg total) by mouth daily.   spironolactone 25 MG tablet Commonly known as: ALDACTONE Take 0.5 tablets (12.5 mg total) by mouth daily. Start taking on: May 15, 2023 What changed: See the new instructions.   torsemide 20 MG tablet Commonly known as: DEMADEX Take 40 mg one day then 20 mg the next day, so continue to alternate 40mg  to 20mg  every other day as per cardio Start taking on: May 16, 2023 What changed:  how much to take how to take this when to take  this additional instructions These instructions start on May 16, 2023. If you are unsure what to do until then, ask your doctor or other care provider.   warfarin 5 MG tablet Commonly known as: COUMADIN Take as directed. If you are unsure how to take this medication, talk to your nurse or doctor. Original instructions: TAKE 1/2 TO 1 TABLET BY MOUTH EVERY DAY AS DIRECTED BY COUMADIN CLINIC               Discharge Care Instructions  (From admission, onward)           Start     Ordered   05/14/23 0000  Change dressing on IV access line weekly and PRN  (Home infusion instructions - [facility] )        05/14/23 1202   05/14/23 0000  Discharge wound care:       Comments: Wound care  Daily      Comments: Cleanse L lower leg/ankle/foot wounds with Vashe wound cleanser [place] [phone]), do not rinse and allow to air dry. Apply silver hydrofiber [place] [phone]) to wound beds daily, cover with silicone foam.  Apply Eucerin to intact skin. Wrap with Kerlix roll gauze beginning right above toes and ending right below knee. Cover with Ace bandage wrapped in same fashion as Kerlix for light compression.   05/14/23 1202            Contact information for follow-up providers     [facility]. Go on 05/22/2023.   Specialty: Cardiology Why: Hospital Follow-Up 05/22/23 @1 :45PM Please bring all medications to follow-up appointment [facility], [address] Free Valet  Parking at the [employer] account planner information: [address] [place] [address] [phone]        Lynn Ito, MD Follow up.   Specialty: Infectious Diseases Why: F/u on 06/04/23 at 10.45AM Contact information: [address] [place] [address] [phone]         Bensimhon,  Bevelyn Buckles, MD Follow up.   Specialty: Cardiology Why: F/u in 1-2 weeks Contact information: [address] [place] [address] [phone]              Contact information for after-discharge care     Destination     [facility] Preferred SNF .   Service: Skilled Nursing Contact information: [address] [place] [address] [phone]                    Allergies  Allergen Reactions   Shellfish Allergy Other (See Comments) and Hives    Pt. instructed by MD to avoid seafood    Consultations: Cardio ID   Procedures/Studies: ECHO TEE Result Date: 05/07/2023    TRANSESOPHOGEAL ECHO REPORT   Patient Name:   [name]. Date of Exam: 05/07/2023 Medical Rec #:  [id number]         Height:       69.0 in Accession #:    [id number]        Weight:       161.2 lb Date of Birth:  20-Nov-1945        BSA:          1.885 m Patient Age:    77 years          BP:           137/78 mmHg Patient Gender: M                 HR:           97 bpm. Exam Location:  [facility] Procedure: Transesophageal Echo, Cardiac Doppler and Color Doppler (Both            Spectral and Color Flow Doppler were utilized during procedure). Indications:     Bacteremia R78.81  History:         Patient has prior history of Echocardiogram examinations, most                  recent 05/05/2023. CAD; Risk Factors:Diabetes and Hypertension.                  Persistent Afib.  Sonographer:     Cristela Blue Referring Phys:  [id number] [facility] Diagnosing Phys: Wilfred Lacy PROCEDURE: The transesophogeal probe was passed without difficulty through the esophogus of the patient. Sedation performed by different physician. The patient developed no complications during the procedure.  IMPRESSIONS  1. Left ventricular ejection fraction, by estimation, is 20 to 25%. The left ventricle has severely decreased function. The left ventricle demonstrates global hypokinesis. The left ventricular internal cavity size was mildly dilated. There is mild concentric left ventricular hypertrophy.  2.  Peak RV-RA gradient 41 mmHg. Right ventricular systolic function is severely reduced. The right ventricular size is mildly enlarged.  3. Left atrial size was moderately dilated. No left atrial/left atrial appendage thrombus was detected.  4. Right atrial size was moderately dilated.  5. A small pericardial effusion is present. The pericardial effusion is circumferential.  6. No mitral valve vegetation. The mitral valve is normal in structure. Mild to moderate mitral valve regurgitation. No evidence of mitral stenosis.  7. No tricuspid valve vegetation.  8. No aortic valve vegetation. The aortic valve is tricuspid. Aortic valve regurgitation is not visualized. No aortic stenosis is present.  9. No pulmonic valve vegetation. 10. No PFO or ASD by color doppler. 11. No evidence  for endocarditis FINDINGS  Left Ventricle: Left ventricular ejection fraction, by estimation, is 20 to 25%. The left ventricle has severely decreased function. The left ventricle demonstrates global hypokinesis. The left ventricular internal cavity size was mildly dilated. There is mild concentric left ventricular hypertrophy. Right Ventricle: Peak RV-RA gradient 41 mmHg. The right ventricular size is mildly enlarged. No increase in right ventricular wall thickness. Right ventricular systolic function is severely reduced. Left Atrium: Left atrial size was moderately dilated. No left atrial/left atrial appendage thrombus was detected. Right Atrium: Right atrial size was moderately dilated. Pericardium: A small pericardial effusion is present. The pericardial effusion is circumferential. Mitral Valve: No mitral valve vegetation. The mitral valve is normal in structure. Mild to moderate mitral valve regurgitation. No evidence of mitral valve stenosis. Tricuspid Valve: No tricuspid valve vegetation. The tricuspid valve is normal in structure. Tricuspid valve regurgitation is mild. Aortic Valve: No aortic valve vegetation. The aortic valve is  tricuspid. Aortic valve regurgitation is not visualized. No aortic stenosis is present. Pulmonic Valve: No pulmonic valve vegetation. The pulmonic valve was normal in structure. Pulmonic valve regurgitation is not visualized. Aorta: The aortic root is normal in size and structure. IAS/Shunts: No PFO or ASD by color doppler. [employer] Electronically signed by Wilfred Lacy Signature Date/Time: 05/07/2023/1:14:36 PM    Final    DG Chest Port 1 View Result Date: 05/06/2023 CLINICAL DATA:  PICC line placement EXAM: PORTABLE CHEST 1 VIEW COMPARISON:  05/03/2023 FINDINGS: Single frontal view of the chest demonstrates interval placement of a left-sided PICC, tip overlying superior vena cava. The cardiac silhouette remains enlarged. There are increasing bibasilar veiling opacities, left greater than right, consistent with bibasilar consolidation and effusions. No pneumothorax. No acute bony abnormalities. IMPRESSION: 1. Left-sided PICC, tip overlying SVC. 2. Increasing bibasilar veiling opacities, consistent with progressive consolidation and effusions. Electronically Signed   By: Sharlet Salina M.D.   On: 05/06/2023 16:35   [place] EKG SITE RITE Result Date: 05/06/2023 If Site Rite image not attached, placement could not be confirmed due to current cardiac rhythm.  ECHOCARDIOGRAM COMPLETE Result Date: 05/05/2023    ECHOCARDIOGRAM REPORT   Patient Name:   [name]. Date of Exam: 05/05/2023 Medical Rec #:  [id number]         Height:       69.0 in Accession #:    [id number]        Weight:       161.2 lb Date of Birth:  11/18/1945        BSA:          1.885 m Patient Age:    77 years          BP:           83/55 mmHg Patient Gender: M                 HR:           86 bpm. Exam Location:  [facility] Procedure: 2D Echo, Cardiac Doppler and Color Doppler (Both Spectral and Color            Flow Doppler were utilized during procedure). Indications:     Bacteremia R78.81  History:         Patient has prior history of  Echocardiogram examinations, most                  recent 05/30/2021.  Sonographer:     Overton Mam RDCS, FASE Referring Phys:  [id number] Andris Baumann Diagnosing Phys: Jodelle Red MD IMPRESSIONS  1. Left ventricular ejection fraction, by estimation, is 20 to 25%. The left ventricle has severely decreased function. The left ventricle demonstrates global hypokinesis. The left ventricular internal cavity size was mildly dilated. There is moderate concentric left ventricular hypertrophy. Left ventricular diastolic function could not be evaluated.  2. Right ventricular systolic function is severely reduced. The right ventricular size is mildly enlarged. There is severely elevated pulmonary artery systolic pressure.  3. Left atrial size was moderately dilated.  4. Right atrial size was severely dilated.  5. A small pericardial effusion is present. The pericardial effusion is posterior and lateral to the left ventricle. There is no evidence of cardiac tamponade.  6. The mitral valve is normal in structure. Mild to moderate mitral valve regurgitation. No evidence of mitral stenosis.  7. Tricuspid valve regurgitation is moderate to severe.  8. The aortic valve is tricuspid. There is mild calcification of the aortic valve. There is mild thickening of the aortic valve. Aortic valve regurgitation is not visualized. Aortic valve sclerosis is present, with no evidence of aortic valve stenosis.  9. The inferior vena cava is dilated in size with <50% respiratory variability, suggesting right atrial pressure of 15 mmHg. Comparison(s): No significant change from prior study. Conclusion(s)/Recommendation(s): No evidence of valvular vegetations on this transthoracic echocardiogram. Consider a transesophageal echocardiogram to exclude infective endocarditis if clinically indicated. FINDINGS  Left Ventricle: Left ventricular ejection fraction, by estimation, is 20 to 25%. The left ventricle has severely decreased function.  The left ventricle demonstrates global hypokinesis. The left ventricular internal cavity size was mildly dilated. There is moderate concentric left ventricular hypertrophy. Left ventricular diastolic function could not be evaluated due to atrial fibrillation. Left ventricular diastolic function could not be evaluated. Right Ventricle: The right ventricular size is mildly enlarged. No increase in right ventricular wall thickness. Right ventricular systolic function is severely reduced. There is severely elevated pulmonary artery systolic pressure. The tricuspid regurgitant velocity is 3.62 m/s, and with an assumed right atrial pressure of 15 mmHg, the estimated right ventricular systolic pressure is 67.4 mmHg. Left Atrium: Left atrial size was moderately dilated. Right Atrium: Right atrial size was severely dilated. Pericardium: A small pericardial effusion is present. The pericardial effusion is posterior and lateral to the left ventricle. There is no evidence of cardiac tamponade. Mitral Valve: The mitral valve is normal in structure. Mild to moderate mitral valve regurgitation. No evidence of mitral valve stenosis. Tricuspid Valve: The tricuspid valve is normal in structure. Tricuspid valve regurgitation is moderate to severe. No evidence of tricuspid stenosis. Aortic Valve: The aortic valve is tricuspid. There is mild calcification of the aortic valve. There is mild thickening of the aortic valve. Aortic valve regurgitation is not visualized. Aortic valve sclerosis is present, with no evidence of aortic valve stenosis. Aortic valve peak gradient measures 7.7 mmHg. Pulmonic Valve: The pulmonic valve was grossly normal. Pulmonic valve regurgitation is trivial. No evidence of pulmonic stenosis. Aorta: The aortic root and ascending aorta are structurally normal, with no evidence of dilitation. Venous: The inferior vena cava is dilated in size with less than 50% respiratory variability, suggesting right atrial  pressure of 15 mmHg. IAS/Shunts: The atrial septum is grossly normal. Additional Comments: There is a small pleural effusion in both left and right lateral regions.  LEFT VENTRICLE PLAX 2D LVIDd:         5.50 cm      Diastology LVIDs:  4.90 cm      LV e' medial:    5.98 cm/s LV PW:         1.50 cm      LV E/e' medial:  13.1 LV IVS:        1.40 cm      LV e' lateral:   9.03 cm/s LVOT diam:     2.30 cm      LV E/e' lateral: 8.6 LV SV:         40 LV SV Index:   21 LVOT Area:     4.15 cm  LV Volumes (MOD) LV vol d, MOD A2C: 132.0 ml LV vol d, MOD A4C: 112.0 ml LV vol s, MOD A2C: 94.5 ml LV vol s, MOD A4C: 86.2 ml LV SV MOD A2C:     37.5 ml LV SV MOD A4C:     112.0 ml LV SV MOD BP:      27.6 ml RIGHT VENTRICLE RV Basal diam:  4.40 cm RV S prime:     6.74 cm/s TAPSE (M-mode): 0.9 cm LEFT ATRIUM           Index        RIGHT ATRIUM           Index LA diam:      4.80 cm 2.55 cm/m   RA Area:     28.50 cm LA Vol (A2C): 86.2 ml 45.73 ml/m  RA Volume:   105.00 ml 55.71 ml/m LA Vol (A4C): 59.9 ml 31.78 ml/m  AORTIC VALVE                 PULMONIC VALVE AV Area (Vmax): 1.74 cm     PV Vmax:       0.74 m/s AV Vmax:        139.00 cm/s  PV Peak grad:  2.2 mmHg AV Peak Grad:   7.7 mmHg LVOT Vmax:      58.30 cm/s LVOT Vmean:     38.800 cm/s LVOT VTI:       0.097 m  AORTA Ao Root diam: 3.50 cm Ao Asc diam:  3.20 cm MITRAL VALVE               TRICUSPID VALVE MV Area (PHT): 4.57 cm    TR Peak grad:   52.4 mmHg MV Decel Time: 166 msec    TR Vmax:        362.00 cm/s MR Peak grad: 70.6 mmHg MR Mean grad: 44.0 mmHg    SHUNTS MR Vmax:      420.00 cm/s  Systemic VTI:  0.10 m MR Vmean:     313.0 cm/s   Systemic Diam: 2.30 cm MV E velocity: 78.10 cm/s Jodelle Red MD Electronically signed by Jodelle Red MD Signature Date/Time: 05/05/2023/2:03:40 PM    Final    US Abdomen Limited RUQ (LIVER/GB) Result Date: 05/04/2023 CLINICAL DATA:  Elevated LFTs.  History of colon cancer. EXAM: ULTRASOUND ABDOMEN LIMITED RIGHT  UPPER QUADRANT COMPARISON:  CT abdomen pelvis from yesterday. FINDINGS: Gallbladder: Contracted gallbladder containing multiple shadowing gallstones. No wall thickening visualized. Trace pericholecystic fluid. No sonographic Murphy sign noted by sonographer. Common bile duct: Diameter: 6 mm, normal. Liver: No focal lesion identified. Within normal limits in parenchymal echogenicity. Portal vein is patent on color Doppler imaging with normal direction of blood flow towards the liver. Other: Trace right perinephric fluid. Right renal simple cysts again noted. No follow-up imaging is recommended. IMPRESSION: 1. Cholelithiasis without evidence of  acute cholecystitis. Electronically Signed   By: Obie Dredge M.D.   On: 05/04/2023 12:31   MR Cervical Spine W and Wo Contrast Result Date: 05/03/2023 CLINICAL DATA:  Neck pain, infection suspected, positive xray/CT. EXAM: MRI CERVICAL SPINE WITHOUT AND WITH CONTRAST TECHNIQUE: Multiplanar and multiecho pulse sequences of the cervical spine, to include the craniocervical junction and cervicothoracic junction, were obtained without and with intravenous contrast. CONTRAST:  7.64mL GADAVIST GADOBUTROL 1 MMOL/ML IV SOLN COMPARISON:  None Available. FINDINGS: Alignment: Straightening.  No substantial sagittal subluxation. Vertebrae: Edema within the disc at C4-C5 without substantial adjacent marrow edema. Please see same day CT of the cervical spine for better evaluation of osseous bony detail. Cord: Normal cord signal. Posterior Fossa, vertebral arteries, paraspinal tissues: Large volume prevertebral edema extending from the craniocervical junction to the lower cervical levels. Possible thinning versus destruction of the posterior longitudinal ligament at C4-C5 (series 7, image 9). Otherwise, no evidence of ligamentous injury. Disc levels: Thin epidural fluid collection which extends circumferentially from C4-C5 inferiorly into the visualized upper thoracic canal. C2-C3:  Bilateral facet and uncovertebral hypertrophy with moderate left foraminal stenosis. Mild canal stenosis. C3-C4: Posterior disc osteophyte complex with bilateral facet uncovertebral hypertrophy. Resulting moderate to severe bilateral foraminal stenosis and mild canal stenosis. C4-C5: Posterior disc osteophyte complex with bilateral facet uncovertebral hypertrophy. Resulting severe bilateral foraminal stenosis and moderate canal stenosis. C5-C6: Posterior disc osteophyte complex with bilateral facet uncovertebral hypertrophy. Resulting severe bilateral foraminal stenosis and mild to moderate canal stenosis. C6-C7: Posterior disc osteophyte complex with bilateral facet uncovertebral hypertrophy. Resulting moderate to severe bilateral foraminal stenosis and mild canal stenosis. C7-T1: Bilateral facet uncovertebral hypertrophy without significant stenosis. IMPRESSION: 1. Large volume prevertebral edema in the upper cervical spine with edema in the C4-C5 disc, possible thinning versus disruption of the posterior longitudinal ligament at this level, and thin epidural fluid collection extending from this level inferiorly into the visualized upper thoracic spine. Findings could be either traumatic or infectious in etiology. A short interval follow-up MRI could assess for change if clinically warranted. 2. Superimposed severe multilevel degenerative change, detailed above and including severe bilateral foraminal stenosis C4-C5 and C5-C6, moderate canal stenosis at C4-C5 and mild-to-moderate canal stenosis C5-C6. Findings discussed with Dr. Larinda Buttery via telephone at 9:45 p.m. Electronically Signed   By: Feliberto Harts M.D.   On: 05/03/2023 22:00   CT Head Wo Contrast Result Date: 05/03/2023 CLINICAL DATA:  Larey Seat out of bed.  Found on the floor. EXAM: CT HEAD WITHOUT CONTRAST CT CERVICAL SPINE WITHOUT CONTRAST TECHNIQUE: Multidetector CT imaging of the head and cervical spine was performed following the standard protocol  without intravenous contrast. Multiplanar CT image reconstructions of the cervical spine were also generated. RADIATION DOSE REDUCTION: This exam was performed according to the departmental dose-optimization program which includes automated exposure control, adjustment of the mA and/or kV according to patient size and/or use of iterative reconstruction technique. COMPARISON:  None Available. FINDINGS: CT HEAD FINDINGS Brain: No evidence of acute infarction, hemorrhage, hydrocephalus, extra-axial collection or mass lesion/mass effect. Atrophy and chronic microvascular ischemic changes. Vascular: Calcified atherosclerosis at the skull base. No hyperdense vessel. Skull: Normal. Negative for fracture or focal lesion. Sinuses/Orbits: No acute finding. Other: None. CT CERVICAL SPINE FINDINGS Alignment: Reversal of the normal cervical lordosis. No traumatic malalignment. Skull base and vertebrae: No acute fracture. No primary bone lesion or focal pathologic process. Mild irregularity of the C4-C5 and C6-C7 endplates. Soft tissues and spinal canal: Prevertebral fluid and swelling extending from  C2-C6 (series 4, image 44). No visible canal hematoma. Disc levels: Multilevel degenerative changes, moderate from C4-C5 through C6-C7. Upper chest: Negative. Other: None. IMPRESSION: Head: 1. No acute intracranial abnormality. Atrophy and chronic microvascular ischemic changes. Cervical spine: 1. No acute cervical spine fracture or traumatic listhesis. 2. Prevertebral fluid and swelling extending from C2-C6, concerning for ligamentous injury. MRI cervical spine is recommended. 3. Irregularity of the C4-C5 and C6-C7 endplates is likely degenerative, but given prevertebral fluid and leukocytosis, attention on follow-up MRI is recommended to exclude osteomyelitis-discitis. Electronically Signed   By: Obie Dredge M.D.   On: 05/03/2023 18:09   CT Cervical Spine Wo Contrast Result Date: 05/03/2023 CLINICAL DATA:  Larey Seat out of bed.   Found on the floor. EXAM: CT HEAD WITHOUT CONTRAST CT CERVICAL SPINE WITHOUT CONTRAST TECHNIQUE: Multidetector CT imaging of the head and cervical spine was performed following the standard protocol without intravenous contrast. Multiplanar CT image reconstructions of the cervical spine were also generated. RADIATION DOSE REDUCTION: This exam was performed according to the departmental dose-optimization program which includes automated exposure control, adjustment of the mA and/or kV according to patient size and/or use of iterative reconstruction technique. COMPARISON:  None Available. FINDINGS: CT HEAD FINDINGS Brain: No evidence of acute infarction, hemorrhage, hydrocephalus, extra-axial collection or mass lesion/mass effect. Atrophy and chronic microvascular ischemic changes. Vascular: Calcified atherosclerosis at the skull base. No hyperdense vessel. Skull: Normal. Negative for fracture or focal lesion. Sinuses/Orbits: No acute finding. Other: None. CT CERVICAL SPINE FINDINGS Alignment: Reversal of the normal cervical lordosis. No traumatic malalignment. Skull base and vertebrae: No acute fracture. No primary bone lesion or focal pathologic process. Mild irregularity of the C4-C5 and C6-C7 endplates. Soft tissues and spinal canal: Prevertebral fluid and swelling extending from C2-C6 (series 4, image 44). No visible canal hematoma. Disc levels: Multilevel degenerative changes, moderate from C4-C5 through C6-C7. Upper chest: Negative. Other: None. IMPRESSION: Head: 1. No acute intracranial abnormality. Atrophy and chronic microvascular ischemic changes. Cervical spine: 1. No acute cervical spine fracture or traumatic listhesis. 2. Prevertebral fluid and swelling extending from C2-C6, concerning for ligamentous injury. MRI cervical spine is recommended. 3. Irregularity of the C4-C5 and C6-C7 endplates is likely degenerative, but given prevertebral fluid and leukocytosis, attention on follow-up MRI is recommended  to exclude osteomyelitis-discitis. Electronically Signed   By: Obie Dredge M.D.   On: 05/03/2023 18:09   DG Shoulder Right Result Date: 05/03/2023 CLINICAL DATA:  Fall and right shoulder pain. EXAM: RIGHT SHOULDER - 2+ VIEW COMPARISON:  None available. FINDINGS: There is no acute fracture or dislocation. Degenerative changes of the right AC joint and mild spurring of the bony glenoid. The soft tissues are unremarkable. IMPRESSION: 1. No acute fracture or dislocation. 2. Degenerative changes. Electronically Signed   By: Elgie Collard M.D.   On: 05/03/2023 18:09   CT ABDOMEN PELVIS WO CONTRAST Result Date: 05/03/2023 CLINICAL DATA:  Unwitnessed fall.  History of colon cancer. EXAM: CT ABDOMEN AND PELVIS WITHOUT CONTRAST TECHNIQUE: Multidetector CT imaging of the abdomen and pelvis was performed following the standard protocol without IV contrast. RADIATION DOSE REDUCTION: This exam was performed according to the departmental dose-optimization program which includes automated exposure control, adjustment of the mA and/or kV according to patient size and/or use of iterative reconstruction technique. COMPARISON:  May 11, 2021. FINDINGS: Lower chest: Minimal left pleural effusion is noted with adjacent subsegmental atelectasis. Hepatobiliary: No focal liver abnormality is seen. No gallstones, gallbladder wall thickening, or biliary dilatation. Pancreas: Unremarkable. No  pancreatic ductal dilatation or surrounding inflammatory changes. Spleen: Normal in size without focal abnormality. Adrenals/Urinary Tract: Adrenal glands appear normal. Stable bilateral renal cysts are noted for which no further follow-up is required. No hydronephrosis or renal obstruction is noted. Urinary bladder is unremarkable. Stomach/Bowel: Stomach is unremarkable. Status post appendectomy. There is no evidence of bowel obstruction or inflammation. Vascular/Lymphatic: Aortic atherosclerosis. No enlarged abdominal or pelvic lymph nodes.  Reproductive: Prostate is unremarkable. Other: No definite hernia is noted. Minimal free fluid is noted in the pelvis and pericolic gutters suggesting ascites. Musculoskeletal: No acute or significant osseous findings. IMPRESSION: Minimal left pleural effusion with minimal adjacent subsegmental atelectasis. Minimal ascites is noted. No other acute abnormality seen in the abdomen or pelvis. Aortic Atherosclerosis (ICD10-I70.0). Electronically Signed   By: Lupita Raider M.D.   On: 05/03/2023 17:59   DG Chest 2 View Result Date: 05/03/2023 CLINICAL DATA:  Weakness, sepsis.  Found down. EXAM: CHEST - 2 VIEW COMPARISON:  Abdominopelvic CT same date. Chest radiographs 06/03/2021 and 05/29/2021. Chest CT 09/20/2020. FINDINGS: Stable cardiomegaly and aortic atherosclerosis. Persistent left pleural effusion with left lower lobe consolidation, similar to prior chest radiographs. The right lung is clear. No evidence of edema or pneumothorax. The bones appear unchanged. IMPRESSION: Persistent left pleural effusion with left lower lobe consolidation, similar to prior chest radiographs and likely reflecting atelectasis or scarring. Left lower lobe pneumonia not excluded. No edema. Electronically Signed   By: Carey Bullocks M.D.   On: 05/03/2023 17:47   (Echo, Carotid, EGD, Colonoscopy, ERCP)    Subjective: pt c/o generalized weakness   Discharge Exam: Vitals:   05/14/23 0731 05/14/23 1106  BP: (!) 115/54 (!) 115/50  Pulse: (!) 54 63  Resp: 17 17  Temp: 98 F (36.7 C) 98.3 F (36.8 C)  SpO2: 97% 97%   Vitals:   05/14/23 0320 05/14/23 0502 05/14/23 0731 05/14/23 1106  BP: (!) 110/45  (!) 115/54 (!) 115/50  Pulse: (!) 56  (!) 54 63  Resp: 20  17 17   Temp: 98.4 F (36.9 C)  98 F (36.7 C) 98.3 F (36.8 C)  TempSrc:      SpO2: 95%  97% 97%  Weight:  59.8 kg    Height:        General: Pt is alert, awake, not in acute distress Cardiovascular: S1/S2 +, no rubs, no gallops Respiratory: CTA  bilaterally, no wheezing, no rhonchi Abdominal: Soft, NT, ND, bowel sounds + Extremities:  no cyanosis    The results of significant diagnostics from this hospitalization (including imaging, microbiology, ancillary and laboratory) are listed below for reference.     Microbiology: Recent Results (from the past 240 hours)  Culture, blood (Routine X 2) w Reflex to ID Panel     Status: None   Collection Time: 05/05/23  2:52 AM   Specimen: BLOOD RIGHT HAND  Result Value Ref Range Status   Specimen Description BLOOD RIGHT HAND  Final   Special Requests   Final    BOTTLES DRAWN AEROBIC AND ANAEROBIC Blood Culture results may not be optimal due to an inadequate volume of blood received in culture bottles   Culture   Final    NO GROWTH 5 DAYS Performed at [facility], [address] [place], [place] [address]    Report Status 05/10/2023 FINAL  Final  Culture, blood (Routine X 2) w Reflex to ID Panel     Status: None   Collection Time: 05/05/23  3:00 AM  Specimen: BLOOD LEFT HAND  Result Value Ref Range Status   Specimen Description BLOOD LEFT HAND  Final   Special Requests   Final    BOTTLES DRAWN AEROBIC AND ANAEROBIC Blood Culture results may not be optimal due to an inadequate volume of blood received in culture bottles   Culture   Final    NO GROWTH 5 DAYS Performed at [facility], [address], [place], [place] [address]    Report Status 05/10/2023 FINAL  Final  Aerobic Culture w Gram Stain (superficial specimen)     Status: None   Collection Time: 05/06/23  5:37 PM   Specimen: Wound  Result Value Ref Range Status   Specimen Description   Final    WOUND Performed at [facility], [address], [place], [place] [address]    Special Requests   Final    LEFT FOOT Performed at [facility], [address] [place], [place] [address]    Gram Stain   Final    RARE WBC PRESENT, PREDOMINANTLY MONONUCLEAR RARE GRAM POSITIVE COCCI IN  PAIRS Performed at [facility], [address] [place], [place] [address]    Culture MODERATE STAPHYLOCOCCUS AUREUS  Final   Report Status 05/09/2023 FINAL  Final   Organism ID, Bacteria STAPHYLOCOCCUS AUREUS  Final      Susceptibility   Staphylococcus aureus - MIC*    CIPROFLOXACIN <=0.5 SENSITIVE Sensitive     ERYTHROMYCIN <=0.25 SENSITIVE Sensitive     GENTAMICIN <=0.5 SENSITIVE Sensitive     OXACILLIN 0.5 SENSITIVE Sensitive     TETRACYCLINE <=1 SENSITIVE Sensitive     VANCOMYCIN 1 SENSITIVE Sensitive     TRIMETH/SULFA <=10 SENSITIVE Sensitive     CLINDAMYCIN <=0.25 SENSITIVE Sensitive     RIFAMPIN <=0.5 SENSITIVE Sensitive     Inducible Clindamycin NEGATIVE Sensitive     LINEZOLID 2 SENSITIVE Sensitive     * MODERATE STAPHYLOCOCCUS AUREUS     Labs: BNP (last 3 results) Recent Labs    08/21/22 1142 12/13/22 1140 04/16/23 1541  BNP 791.1* 2,083.6* 1,510.6*   Basic Metabolic Panel: Recent Labs  Lab 05/10/23 0622 05/11/23 1000 05/12/23 0640 05/13/23 0510 05/14/23 0421  NA 139 137 137 135 137  K 3.4* 4.3 3.9 3.6 3.5  CL 99 99 98 97* 97*  CO2 30 29 29 29 30   GLUCOSE 146* 161* 152* 132* 134*  BUN 58* 52* 51* 55* 54*  CREATININE 2.20* 2.35* 2.34* 2.33* 2.42*  CALCIUM 8.3* 8.6* 8.5* 8.4* 8.6*   Liver Function Tests: Recent Labs  Lab 05/08/23 0421 05/14/23 0421  AST 44* 25  ALT 6 <5  ALKPHOS 96 105  BILITOT 1.6* 1.5*  PROT 5.5* 6.5  ALBUMIN 2.1* 2.2*   No results for input(s): "LIPASE", "AMYLASE" in the last 168 hours. No results for input(s): "AMMONIA" in the last 168 hours. CBC: Recent Labs  Lab 05/09/23 0935 05/10/23 0622 05/11/23 1000 05/11/23 2200 05/12/23 0640 05/13/23 0510  WBC 7.1 6.0 6.4 7.1 6.5 7.6  NEUTROABS 5.6 4.6 5.1 5.6 4.9  --   HGB 12.9* 11.6* 11.7* 12.2* 12.8* 13.1  HCT 39.8 35.3* 36.6* 38.2* 39.1 39.1  MCV 102.1* 99.2 101.1* 101.3* 100.3* 96.5  PLT 131* 129* 136* 143* 152 177   Cardiac Enzymes: Recent Labs  Lab  05/08/23 2019  CKTOTAL 160   BNP: Invalid input(s): "POCBNP" CBG: Recent Labs  Lab 05/13/23 1321 05/13/23 1713 05/13/23 2107 05/14/23 0733 05/14/23 1136  GLUCAP 189*  226* 163* 127* 220*   D-Dimer No results for input(s): "DDIMER" in the last 72 hours. Hgb A1c No results for input(s): "HGBA1C" in the last 72 hours. Lipid Profile No results for input(s): "CHOL", "HDL", "LDLCALC", "TRIG", "CHOLHDL", "LDLDIRECT" in the last 72 hours. Thyroid function studies No results for input(s): "TSH", "T4TOTAL", "T3FREE", "THYROIDAB" in the last 72 hours.  Invalid input(s): "FREET3" Anemia work up No results for input(s): "VITAMINB12", "FOLATE", "FERRITIN", "TIBC", "IRON", "RETICCTPCT" in the last 72 hours. Urinalysis    Component Value Date/Time   COLORURINE YELLOW (A) 05/03/2023 1630   APPEARANCEUR HAZY (A) 05/03/2023 1630   APPEARANCEUR Clear 08/23/2021 1338   LABSPEC 1.014 05/03/2023 1630   PHURINE 5.0 05/03/2023 1630   GLUCOSEU NEGATIVE 05/03/2023 1630   HGBUR MODERATE (A) 05/03/2023 1630   BILIRUBINUR NEGATIVE 05/03/2023 1630   BILIRUBINUR Negative 08/23/2021 1338   KETONESUR NEGATIVE 05/03/2023 1630   PROTEINUR 100 (A) 05/03/2023 1630   NITRITE NEGATIVE 05/03/2023 1630   LEUKOCYTESUR NEGATIVE 05/03/2023 1630   Sepsis Labs Recent Labs  Lab 05/11/23 1000 05/11/23 2200 05/12/23 0640 05/13/23 0510  WBC 6.4 7.1 6.5 7.6   Microbiology Recent Results (from the past 240 hours)  Culture, blood (Routine X 2) w Reflex to ID Panel     Status: None   Collection Time: 05/05/23  2:52 AM   Specimen: BLOOD RIGHT HAND  Result Value Ref Range Status   Specimen Description BLOOD RIGHT HAND  Final   Special Requests   Final    BOTTLES DRAWN AEROBIC AND ANAEROBIC Blood Culture results may not be optimal due to an inadequate volume of blood received in culture bottles   Culture   Final    NO GROWTH 5 DAYS Performed at [facility], [address] [place], [place]  [address]    Report Status 05/10/2023 FINAL  Final  Culture, blood (Routine X 2) w Reflex to ID Panel     Status: None   Collection Time: 05/05/23  3:00 AM   Specimen: BLOOD LEFT HAND  Result Value Ref Range Status   Specimen Description BLOOD LEFT HAND  Final   Special Requests   Final    BOTTLES DRAWN AEROBIC AND ANAEROBIC Blood Culture results may not be optimal due to an inadequate volume of blood received in culture bottles   Culture   Final    NO GROWTH 5 DAYS Performed at [facility], [address], [place], [place] [address]    Report Status 05/10/2023 FINAL  Final  Aerobic Culture w Gram Stain (superficial specimen)     Status: None   Collection Time: 05/06/23  5:37 PM   Specimen: Wound  Result Value Ref Range Status   Specimen Description   Final    WOUND Performed at [facility], [address] [place], [place] [address]    Special Requests   Final    LEFT FOOT Performed at [facility], [address] [place], [place] [address]    Gram Stain   Final    RARE WBC PRESENT, PREDOMINANTLY MONONUCLEAR RARE GRAM POSITIVE COCCI IN PAIRS Performed at [facility], [address], [place], [place] [address]    Culture MODERATE STAPHYLOCOCCUS AUREUS  Final   Report Status 05/09/2023 FINAL  Final   Organism ID, Bacteria STAPHYLOCOCCUS AUREUS  Final      Susceptibility   Staphylococcus aureus - MIC*    CIPROFLOXACIN <=0.5 SENSITIVE Sensitive     ERYTHROMYCIN <=0.25 SENSITIVE Sensitive  GENTAMICIN <=0.5 SENSITIVE Sensitive     OXACILLIN 0.5 SENSITIVE Sensitive     TETRACYCLINE <=1 SENSITIVE Sensitive     VANCOMYCIN 1 SENSITIVE Sensitive     TRIMETH/SULFA <=10 SENSITIVE Sensitive     CLINDAMYCIN <=0.25 SENSITIVE Sensitive     RIFAMPIN <=0.5 SENSITIVE Sensitive     Inducible Clindamycin NEGATIVE Sensitive     LINEZOLID 2 SENSITIVE Sensitive     * MODERATE STAPHYLOCOCCUS AUREUS     Time coordinating discharge: Over 30  minutes  SIGNED:   Charise Killian, MD  [facility] 05/14/2023, 12:16 PM Pager   If 7PM-7AM, please contact night-coverage [email or web address]

## 2023-05-14 NOTE — Progress Notes (Signed)
 Occupational Therapy Treatment Patient Details Name: [name]. MRN: [id number] DOB: 08/25/1945 Today's Date: 05/14/2023   History of present illness Patient is a 78 year old male found down at home. Found to have severe sepsis, acute on chronic systolic CHF, MSSA bacteremia, atrial fibrillation s/p cardioversion, C-spine prevertebral edema   OT comments  Pt seen for OT/PT treatment on this date. Upon arrival to room pt in bed, initially apprehensive about participation in tx, but agreed to work to improve overall strength. MAX A to adjust c-collar while pt seated on EOB. Pt required MODA+2 for STS from EOB with RW. Pt completed within room amb, forward and backward steps two amb bouts total ~85ft. MODA +2 throughout for stability and DME management. Pt completed seated grooming tasks with set up only while seated on EOB. MODA +2 simulated BSC t/f amb with RW. Pt participated in BUE dynamic reaching exercises while pt seated on EOB, Pt educated on the importance of breathing exercises with incentive spirometer throughout the day, pt completed 10 reps. VSS throughout. Pt making good progress toward goals, will continue to follow POC. Discharge recommendation remains appropriate.        If plan is discharge home, recommend the following:  Two people to help with walking and/or transfers;Two people to help with bathing/dressing/bathroom   Equipment Recommendations  Other (comment)    Recommendations for Other Services      Precautions / Restrictions Precautions Precautions: Fall;Cervical Precaution Booklet Issued: No Recall of Precautions/Restrictions: Impaired Required Braces or Orthoses: Cervical Brace Cervical Brace: Hard collar Restrictions Weight Bearing Restrictions Per Provider Order: No       Mobility Bed Mobility Overal bed mobility: Needs Assistance Bed Mobility: Supine to Sit, Sit to Supine Rolling: Mod assist   Supine to sit: Min assist, Used rails Sit to supine:  Mod assist        Transfers Overall transfer level: Needs assistance Equipment used: Rolling walker (2 wheels) Transfers: Sit to/from Stand Sit to Stand: Mod assist, +2 physical assistance, +2 safety/equipment           General transfer comment: Pt required MODA+2 for STS from EOB with RW. Pt completed within room amb, forward and backward steps two amb bouts total ~27ft. MODA +2 throughout for stability and DME management     Balance Overall balance assessment: Needs assistance Sitting-balance support: Feet supported Sitting balance-[name]: Good Sitting balance - Comments: Good reaching outside BOS Postural control: Posterior lean Standing balance support: Bilateral upper extremity supported Standing balance-[name]: Poor Standing balance comment: heavy reliant on external supports                           ADL either performed or assessed with clinical judgement   ADL Overall ADL's : Needs assistance/impaired     Grooming: Wash/dry face;Wash/dry hands;Sitting;Set up           Upper Body Dressing : Maximal assistance;Sitting (C-collar adjusting pt seated on EOB)   Lower Body Dressing: Maximal assistance (Adjusting socks at EOB)   Toilet Transfer: Moderate assistance;+2 for physical assistance;Rolling walker (2 wheels);BSC/3in1 (Simulated t/f)           Functional mobility during ADLs: Moderate assistance;Rolling walker (2 wheels);+2 for physical assistance General ADL Comments: Pt completed seated grooming tasks with set up only while seated on EOB. MODA +2 simulated BSC t/f amb with RW.    Communication Communication Communication: No apparent difficulties Factors Affecting Communication: Difficulty expressing  self   Cognition Arousal: Alert Behavior During Therapy: WFL for tasks assessed/performed               OT - Cognition Comments: A/O x4                 Following commands: Intact Following commands impaired:  Follows one step commands with increased time      Cueing   Cueing Techniques: Verbal cues, Tactile cues  Exercises Exercises: Other exercises Other Exercises Other Exercises: BUE dynamic reaching exercises while pt seated on EOB Other Exercises: Edu: Safe DME management during amb/handplacements throughout STS wth RW, importance of breathing exercises with incentive spirometer throughout the day, pt completed 10 reps.    Shoulder Instructions       General Comments Encouragement required to participate, good effort throughout    Pertinent Vitals/ Pain       Pain Assessment Pain Assessment: No/denies pain  Home Living                                          Prior Functioning/Environment              Frequency  Min 2X/week        Progress Toward Goals  OT Goals(current goals can now be found in the care plan section)  Progress towards OT goals: Progressing toward goals  Acute Rehab OT Goals Patient Stated Goal: to go home OT Goal Formulation: With patient Time For Goal Achievement: 05/23/23 Potential to Achieve Goals: Good ADL Goals Pt Will Perform Grooming: with modified independence;standing Pt Will Perform Lower Body Dressing: with modified independence;sit to/from stand Pt Will Transfer to Toilet: with modified independence;ambulating;regular height toilet  Plan      Co-evaluation    PT/OT/SLP Co-Evaluation/Treatment: Yes Reason for Co-Treatment: For patient/therapist safety;To address functional/ADL transfers   OT goals addressed during session: ADL's and self-care      AM-PAC OT "6 Clicks" Daily Activity     Outcome Measure   Help from another person eating meals?: None Help from another person taking care of personal grooming?: A Little Help from another person toileting, which includes using toliet, bedpan, or urinal?: A Lot Help from another person bathing (including washing, rinsing, drying)?: A Lot Help from another  person to put on and taking off regular upper body clothing?: A Little Help from another person to put on and taking off regular lower body clothing?: A Lot 6 Click Score: 16    End of Session Equipment Utilized During Treatment: Gait belt;Rolling walker (2 wheels)  OT Visit Diagnosis: Other abnormalities of gait and mobility (R26.89);Muscle weakness (generalized) (M62.81)   Activity Tolerance Patient tolerated treatment well   Patient Left in bed;with call bell/phone within reach;with bed alarm set   Nurse Communication Mobility status        Time: 1610-9604 OT Time Calculation (min): 23 min  Charges: OT General Charges $OT Visit: 1 Visit OT Treatments $Self Care/Home Management : 8-22 mins  Glenard Haring M.S. OTR/L  05/14/23, 11:50 AM

## 2023-05-14 NOTE — ED Provider Notes (Signed)
 [facility] Department of Emergency Medicine   Code Blue CONSULT NOTE  Chief Complaint: Cardiac arrest/unresponsive   Level V Caveat: Unresponsive  History of present illness: I was contacted by the hospital for a CODE BLUE cardiac arrest in the hallway adjacent to MRI and presented to the patient's bedside.    ROS: Unable to obtain, Level V caveat  Scheduled Meds: Continuous Infusions:  sodium chloride Stopped (05/14/23 1813)   fentaNYL infusion INTRAVENOUS 100 mcg/hr (05/14/23 1848)   midazolam 0.5 mg/hr (05/14/23 1858)   norepinephrine (LEVOPHED) Adult infusion 30 mcg/min (05/14/23 1845)   sodium chloride     vasopressin Stopped (05/14/23 1936)   PRN Meds:.sodium chloride, EPINEPHrine Past Medical History:  Diagnosis Date   Adenocarcinoma of colon (HCC) 06/19/2013   a. moderately differentiated stage IIIc (T4bN2a M0) adenocarcinoma of colon  s/p colectomy followed by chemoRx with oxaliplatin & fluorouacil.   Anemia    CAD (coronary artery disease)    a. 02/2015 Cath: LM nl, LAD 50/40 mid/distal, LCX 80p, RCA 40p, 30d, RPL1 40, CO 3.27, CI 1.65; b. 06/09/15 PCI: LM minor irregs, m-dLAD 50%, dLAD 40%, pLCx 80% (s/p PCI/DES 0%), pRCA 40%, dRCA 30%, 1st RPLB 40%   Chronic kidney disease, stage 3a (HCC)    Chronic systolic CHF (congestive heart failure) (HCC)    a. 02/2015 Echo: EF 20-25%, diff HK, mod MR, mildy dil LA, mildly dil RV w/ mod RV syst dysfxn, mildly dil RA, mod TR, PASP ; b. 09/2017 Echo: EF 40-45%, diff HK. Gr1 DD. Mild to mod MR. Mildly to mod dil LA; c. 09/2019 Echo: EF 25-30%, glob HK, mod reduced RV fxn, sev elev PASP. Mod dil LA. Mild-mod MR; d. 05/2021 Echo: EF 20-25%, glob HK, sev red RV fxn, sev BAE, sev MR/TR/TS, triv AI.   Diabetes mellitus without complication (HCC)    Essential hypertension    Hypertensive heart disease    Left inguinal hernia 05/2020   Mixed Ischemic and Non-ischemic Cardiomyopathy    a. 02/2015 EF 20-25%, diff HK;  b. 09/2016 EF 40-45%; c. 09/2019 EF 25-30%; d. 05/2021 Echo: EF 20-25%, glob HK.   Persistent atrial fibrillation (HCC)    a. Dx 05/2021-->CHA2DS2VASc = 5-->eliquis.   Pulmonary hypertension (HCC)    Severe mitral regurgitation    a. 09/2017 Echo: Mild to mod MR; b. 09/2019 Echo: mild-mod MR; c. 05/2021 Echo: Sev MR/TR/TS.   Tubular adenoma of colon    Past Surgical History:  Procedure Laterality Date   APPENDECTOMY N/A 06/19/2017   Location: [facility]; Surgeon: Claude Manges, MD   CARDIAC CATHETERIZATION Bilateral 02/24/2015   Procedure: Right/Left Heart Cath and Coronary Angiography;  Surgeon: Iran Ouch, MD;  Location: [facility] INVASIVE CV LAB;  Service: Cardiovascular;  Laterality: Bilateral;   CARDIAC CATHETERIZATION N/A 06/09/2015   Procedure: Coronary Stent Intervention (3.0 x 12 mm Xience Alpine DES to LCx);  Surgeon: Iran Ouch, MD;  Location: [facility] INVASIVE CV LAB;  Service: Cardiovascular;  Laterality: N/A   COLECTOMY  06/19/2017   Descending and proximal sigmoid colectomy, LEFT ureterolysis, partial cecectomy, appendectomy; Location: [facility]; Surgeon: Claude Manges, MD   COLONOSCOPY     COLONOSCOPY WITH PROPOFOL N/A 12/11/2016   Procedure: COLONOSCOPY WITH PROPOFOL;  Surgeon: Christena Deem, MD;  Location: [facility] ENDOSCOPY;  Service: Endoscopy;  Laterality: N/A;   ESOPHAGOGASTRODUODENOSCOPY     HERNIA REPAIR     PORT-A-CATH REMOVAL N/A 07/12/2014   Procedure: REMOVAL PORT-A-CATH;  Surgeon: Duwaine Maxin, MD;  Location: [facility] ORS;  Service: General;  Laterality: N/A;   PORTACATH PLACEMENT Left 2015   TEE WITHOUT CARDIOVERSION N/A 05/07/2023   Procedure: ECHOCARDIOGRAM, TRANSESOPHAGEAL;  Surgeon: Laurey Morale, MD;  Location: [facility] ORS;  Service: Cardiovascular;  Laterality: N/A;   Social History   Socioeconomic History   Marital status: Married    Spouse name: Carney Bern   Number of children: 4   Years of education: Not on file   Highest education level: Not on file   Occupational History   Occupation: retired  Tobacco Use   Smoking status: Never   Smokeless tobacco: Never  Vaping Use   Vaping status: Never Used  Substance and Sexual Activity   Alcohol use: Not Currently    Comment: none in many years   Drug use: No   Sexual activity: Yes  Other Topics Concern   Not on file  Social History Narrative   Not on file   Social Drivers of Health   Financial Resource Strain: Low Risk  (10/17/2022)   Overall Financial Resource Strain (CARDIA)    Difficulty of Paying Living Expenses: Not hard at all  Food Insecurity: No Food Insecurity (05/04/2023)   Hunger Vital Sign    Worried About Running Out of Food in the Last Year: Never true    Ran Out of Food in the Last Year: Never true  Transportation Needs: No Transportation Needs (05/04/2023)   PRAPARE - Administrator, Civil Service (Medical): No    Lack of Transportation (Non-Medical): No  Physical Activity: Sufficiently Active (10/17/2022)   Exercise Vital Sign    Days of Exercise per Week: 4 days    Minutes of Exercise per Session: 60 min  Stress: No Stress Concern Present (10/17/2022)   [employer] of Occupational Health - Occupational Stress Questionnaire    Feeling of Stress : Not at all  Social Connections: Moderately Isolated (05/04/2023)   Social Connection and Isolation Panel [NHANES]    Frequency of Communication with Friends and Family: Never    Frequency of Social Gatherings with Friends and Family: Once a week    Attends Religious Services: More than 4 times per year    Active Member of [employer] or Organizations: No    Attends Banker Meetings: Never    Marital Status: Married  Catering manager Violence: Not At Risk (05/04/2023)   Humiliation, Afraid, Rape, and Kick questionnaire    Fear of Current or Ex-Partner: No    Emotionally Abused: No    Physically Abused: No    Sexually Abused: No   Allergies  Allergen Reactions   Shellfish Allergy Other (See  Comments) and Hives    Pt. instructed by MD to avoid seafood    Last set of Vital Signs (not current) Vitals:   05/14/23 1855 05/14/23 1859  BP: 135/72 133/73  Pulse: 68 68  Resp: 18 20  SpO2: 100% 100%      Physical Exam  Gen: unresponsive Cardiovascular: pulseless  Resp: apneic. Breath sounds equal bilaterally with bagging  Abd: nondistended  Neuro: GCS 3, unresponsive to pain  Neck: No crepitus  Musculoskeletal: No deformity  Skin: warm  Procedures (when applicable, including Critical Care time): Procedures   MDM / Assessment and Plan I arrived at the bedside just as the patient was found to be pulseless and CPR was being initiated by the cardiologist who had responded to the CODE BLUE.  The patient had ventricular fibrillation, was defibrillated, subsequently had asystole, received epinephrine,  and then had ROSC.  At that time we elected to move him to an ED bed.  Intubation and the rest of his care were performed in the ED, and this is documented in my separate ED note.   Dionne Bucy, MD 05/14/23 [phone]

## 2023-05-14 NOTE — Code Documentation (Addendum)
   Code Blue Note  Responded to Code Blue. Patient being discharged home on stretcher.  Became unresponsive. I was among first responders with Dr. Okey Dupre and RRT  Patient agonal and non-responsive. No palpable pulse.   I began CPR immediately. Pads placed and initial rhythm was  VF  Shocked with 200J with subsequent asystole. Given epi x 1 with ongoing CPR.  Patient then with ROSC within 5 mins and SR on monitor.  EDP on site. Patient personally transported to ER with RRT and signed over to EDP.   The AHF team will follow   CCT 45 mins   Arvilla Meres, MD  6:49 PM

## 2023-05-14 NOTE — TOC Transition Note (Signed)
 Transition of Care [facility]) - Discharge Note   Patient Details  Name: [name]. MRN: [id number] Date of Birth: 09/21/1945  [facility]) CM/SW Contact:  Margarito Liner, LCSW Phone Number: 05/14/2023, 2:25 PM   Clinical Narrative:  Patient has orders to discharge to [facility] today. RN has already called report. [facility] has been arranged and they said he was third on the list. No further concerns. CSW signing off.   Final next level of care: Skilled Nursing Facility Barriers to Discharge: Barriers Resolved   Patient Goals and CMS Choice            Discharge Placement PASRR number recieved: 05/14/23            Patient chooses bed at: [facility] [place] Patient to be transferred to facility by: [facility] Name of family member notified: Trask Vosler Patient and family notified of of transfer: 05/14/23  Discharge Plan and Services Additional resources added to the After Visit Summary for       Post Acute Care Choice: Home Health, Durable Medical Equipment                               Social Drivers of Health (SDOH) Interventions SDOH Screenings   Food Insecurity: No Food Insecurity (05/04/2023)  Housing: Low Risk  (05/04/2023)  Transportation Needs: No Transportation Needs (05/04/2023)  Utilities: Not At Risk (05/04/2023)  Alcohol Screen: Low Risk  (10/17/2022)  Depression (PHQ2-9): Low Risk  (01/23/2023)  Financial Resource Strain: Low Risk  (10/17/2022)  Physical Activity: Sufficiently Active (10/17/2022)  Social Connections: Moderately Isolated (05/04/2023)  Stress: No Stress Concern Present (10/17/2022)  Tobacco Use: Low Risk  (05/04/2023)  Health Literacy: Inadequate Health Literacy (10/17/2022)     Readmission Risk Interventions    05/09/2023   12:59 PM  Readmission Risk Prevention Plan  PCP or Specialist Appt within 3-5 Days Complete  Social Work Consult for Recovery Care  Planning/Counseling Complete  Palliative Care Screening Not Applicable

## 2023-05-14 NOTE — ED Notes (Signed)
 Pt transferred from ED15 to CT then ICU15 with RN and RT with no issue

## 2023-05-14 NOTE — Consult Note (Addendum)
 PHARMACY - ANTICOAGULATION CONSULT NOTE  Pharmacy Consult for Warfarin Indication: atrial fibrillation  Allergies  Allergen Reactions   Shellfish Allergy Other (See Comments) and Hives    Pt. instructed by MD to avoid seafood    Patient Measurements: Height: 5\' 9"  (175.3 cm) Weight: 59.8 kg (131 lb 13.4 oz) IBW/kg (Calculated) : 70.7 HEPARIN DW (KG): 73.1  Vital Signs: Temp: 98 F (36.7 C) (04/08 0731) BP: 115/54 (04/08 0731) Pulse Rate: 54 (04/08 0731)  Labs: Recent Labs    05/11/23 2200 05/12/23 0640 05/12/23 0651 05/13/23 0510 05/14/23 0421  HGB 12.2* 12.8*  --  13.1  --   HCT 38.2* 39.1  --  39.1  --   PLT 143* 152  --  177  --   LABPROT  --   --  40.6* 46.4* 29.3*  INR  --   --  4.2* 5.0* 2.8*  CREATININE  --  2.34*  --  2.33* 2.42*    Estimated Creatinine Clearance: 22 mL/min (A) (by C-G formula based on SCr of 2.42 mg/dL (H)).   Medical History: Past Medical History:  Diagnosis Date   Adenocarcinoma of colon (HCC) 06/19/2013   a. moderately differentiated stage IIIc (T4bN2a M0) adenocarcinoma of colon  s/p colectomy followed by chemoRx with oxaliplatin & fluorouacil.   Anemia    CAD (coronary artery disease)    a. 02/2015 Cath: LM nl, LAD 50/40 mid/distal, LCX 80p, RCA 40p, 30d, RPL1 40, CO 3.27, CI 1.65; b. 06/09/15 PCI: LM minor irregs, m-dLAD 50%, dLAD 40%, pLCx 80% (s/p PCI/DES 0%), pRCA 40%, dRCA 30%, 1st RPLB 40%   Chronic kidney disease, stage 3a (HCC)    Chronic systolic CHF (congestive heart failure) (HCC)    a. 02/2015 Echo: EF 20-25%, diff HK, mod MR, mildy dil LA, mildly dil RV w/ mod RV syst dysfxn, mildly dil RA, mod TR, PASP ; b. 09/2017 Echo: EF 40-45%, diff HK. Gr1 DD. Mild to mod MR. Mildly to mod dil LA; c. 09/2019 Echo: EF 25-30%, glob HK, mod reduced RV fxn, sev elev PASP. Mod dil LA. Mild-mod MR; d. 05/2021 Echo: EF 20-25%, glob HK, sev red RV fxn, sev BAE, sev MR/TR/TS, triv AI.   Diabetes mellitus without complication (HCC)     Essential hypertension    Hypertensive heart disease    Left inguinal hernia 05/2020   Mixed Ischemic and Non-ischemic Cardiomyopathy    a. 02/2015 EF 20-25%, diff HK; b. 09/2016 EF 40-45%; c. 09/2019 EF 25-30%; d. 05/2021 Echo: EF 20-25%, glob HK.   Persistent atrial fibrillation (HCC)    a. Dx 05/2021-->CHA2DS2VASc = 5-->eliquis.   Pulmonary hypertension (HCC)    Severe mitral regurgitation    a. 09/2017 Echo: Mild to mod MR; b. 09/2019 Echo: mild-mod MR; c. 05/2021 Echo: Sev MR/TR/TS.   Tubular adenoma of colon     Medications:  Per note from Anti-Coag visit on 04/24/23 patient taking Warfarin 2.5 mg po daily.  Assessment: 78 yo male presenting to ED by EMS due to being found in floor by wife. PMH includes Afib on Warfarin, CAD, DM, HTN, CHF, and CKD.  Patient treated for sepsis likely due to pneumonia.  Pharmacy consulted to manage Warfarin.  INR on admission was 5.6,so warfarin was held. INR continued to trend up, so IV vitamin K was given. This returned INR to therapeutic range and the patient received two 2.5 mg doses before INR became supratherapeutic again. INR is therapeutic after holding warfarin x 1 week and giving  oral vitamin K 5 mg yesterday.  LFTs were elevated on admission, but are now normal. Patient is sensitive to warfarin evidenced by low PTA dose; with poor oral intake and new start amiodarone, INR has been consistently elevated. Given that resuming warfarin last time INR was therapeutic caused swift return to supratherapeutic levels, will hold one more day to observe trend.  DDI: new start amiodarone (peak effect on INR ~2 weeks)  Goal of Therapy:  INR 2-3 Monitor platelets by anticoagulation protocol: Yes    Date INR Warfarin Dose  3/28 5.6 Hold  3/29 7.2 Hold + vitamin K IV 10 mg x 1  3/30 2.4 2.5 mg  3/31 2.6 2.5 mg  4/01 3.5 HOLD  4/02 4.5 HOLD  4/03 4.5 HOLD  4/04 4.4 HOLD  4/5 3.7 HOLD  4/6 4.2 HOLD  4/7 5 HOLD     Plan:  Hold warfarin today Daily  INR. CBC at least every 3 days.   Please do not hesitate to reach out with questions or concerns,  Enos Fling, PharmD, CPP, BCPS Heart Failure Pharmacist  Phone - [phone] 05/14/2023 2:36 PM  ADDENDUM: Noted plans to discharge today. Would start warfarin 1 mg daily tomorrow, adjusted based on INR.

## 2023-05-15 ENCOUNTER — Other Ambulatory Visit: Payer: Self-pay

## 2023-05-15 ENCOUNTER — Ambulatory Visit

## 2023-05-15 DIAGNOSIS — E43 Unspecified severe protein-calorie malnutrition: Secondary | ICD-10-CM | POA: Insufficient documentation

## 2023-05-15 DIAGNOSIS — Z7189 Other specified counseling: Secondary | ICD-10-CM

## 2023-05-15 DIAGNOSIS — I469 Cardiac arrest, cause unspecified: Secondary | ICD-10-CM | POA: Diagnosis not present

## 2023-05-15 LAB — GLUCOSE, CAPILLARY
Glucose-Capillary: 110 mg/dL — ABNORMAL HIGH (ref 70–99)
Glucose-Capillary: 140 mg/dL — ABNORMAL HIGH (ref 70–99)
Glucose-Capillary: 316 mg/dL — ABNORMAL HIGH (ref 70–99)
Glucose-Capillary: 68 mg/dL — ABNORMAL LOW (ref 70–99)
Glucose-Capillary: 68 mg/dL — ABNORMAL LOW (ref 70–99)
Glucose-Capillary: 96 mg/dL (ref 70–99)
Glucose-Capillary: 96 mg/dL (ref 70–99)

## 2023-05-15 LAB — PROTIME-INR
INR: 2.3 — ABNORMAL HIGH (ref 0.8–1.2)
INR: 2.5 — ABNORMAL HIGH (ref 0.8–1.2)
INR: 2.8 — ABNORMAL HIGH (ref 0.8–1.2)
Prothrombin Time: 25.5 s — ABNORMAL HIGH (ref 11.4–15.2)
Prothrombin Time: 26.9 s — ABNORMAL HIGH (ref 11.4–15.2)
Prothrombin Time: 29.5 s — ABNORMAL HIGH (ref 11.4–15.2)

## 2023-05-15 LAB — COMPREHENSIVE METABOLIC PANEL WITH GFR
ALT: <5 U/L (ref 0–44)
AST: 31 U/L (ref 15–41)
Albumin: 2.3 g/dL — ABNORMAL LOW (ref 3.5–5.0)
Alkaline Phosphatase: 125 U/L (ref 38–126)
Anion gap: 8 (ref 5–15)
BUN: 55 mg/dL — ABNORMAL HIGH (ref 8–23)
CO2: 30 mmol/L (ref 22–32)
Calcium: 8.7 mg/dL — ABNORMAL LOW (ref 8.9–10.3)
Chloride: 97 mmol/L — ABNORMAL LOW (ref 98–111)
Creatinine, Ser: 2.55 mg/dL — ABNORMAL HIGH (ref 0.61–1.24)
GFR, Estimated: 25 mL/min — ABNORMAL LOW (ref >60)
Glucose, Bld: 104 mg/dL — ABNORMAL HIGH (ref 70–99)
Potassium: 3.2 mmol/L — ABNORMAL LOW (ref 3.5–5.1)
Sodium: 135 mmol/L (ref 135–145)
Total Bilirubin: 1.4 mg/dL — ABNORMAL HIGH (ref 0.0–1.2)
Total Protein: 6.8 g/dL (ref 6.5–8.1)

## 2023-05-15 LAB — CBC
HCT: 37.5 % — ABNORMAL LOW (ref 39.0–52.0)
Hemoglobin: 12.4 g/dL — ABNORMAL LOW (ref 13.0–17.0)
MCH: 32.3 pg (ref 26.0–34.0)
MCHC: 33.1 g/dL (ref 30.0–36.0)
MCV: 97.7 fL (ref 80.0–100.0)
Platelets: 216 10*3/uL (ref 150–400)
RBC: 3.84 MIL/uL — ABNORMAL LOW (ref 4.22–5.81)
RDW: 15.9 % — ABNORMAL HIGH (ref 11.5–15.5)
WBC: 11.4 10*3/uL — ABNORMAL HIGH (ref 4.0–10.5)
nRBC: 0 % (ref 0.0–0.2)

## 2023-05-15 LAB — MAGNESIUM: Magnesium: 2.3 mg/dL (ref 1.7–2.4)

## 2023-05-15 LAB — RENAL FUNCTION PANEL
Albumin: 2.1 g/dL — ABNORMAL LOW (ref 3.5–5.0)
Anion gap: 8 (ref 5–15)
BUN: 53 mg/dL — ABNORMAL HIGH (ref 8–23)
CO2: 31 mmol/L (ref 22–32)
Calcium: 8.6 mg/dL — ABNORMAL LOW (ref 8.9–10.3)
Chloride: 97 mmol/L — ABNORMAL LOW (ref 98–111)
Creatinine, Ser: 2.57 mg/dL — ABNORMAL HIGH (ref 0.61–1.24)
GFR, Estimated: 25 mL/min — ABNORMAL LOW (ref >60)
Glucose, Bld: 156 mg/dL — ABNORMAL HIGH (ref 70–99)
Phosphorus: 3.3 mg/dL (ref 2.5–4.6)
Potassium: 4.5 mmol/L (ref 3.5–5.1)
Sodium: 136 mmol/L (ref 135–145)

## 2023-05-15 LAB — COOXEMETRY PANEL
Carboxyhemoglobin: 2 % — ABNORMAL HIGH (ref 0.5–1.5)
Methemoglobin: <0.7 % (ref 0.0–1.5)
O2 Saturation: 83.1 %
Total hemoglobin: 11.4 g/dL — ABNORMAL LOW (ref 12.0–16.0)
Total oxygen content: 81.2 %

## 2023-05-15 LAB — PHOSPHORUS: Phosphorus: 3.2 mg/dL (ref 2.5–4.6)

## 2023-05-15 LAB — TROPONIN I (HIGH SENSITIVITY)
Troponin I (High Sensitivity): 605 ng/L (ref <18)
Troponin I (High Sensitivity): 790 ng/L (ref <18)
Troponin I (High Sensitivity): 672 ng/L (ref <18)

## 2023-05-15 LAB — TRIGLYCERIDES: Triglycerides: 49 mg/dL (ref <150)

## 2023-05-15 LAB — MRSA NEXT GEN BY PCR, NASAL: MRSA by PCR Next Gen: DETECTED — AB

## 2023-05-15 MED ORDER — AMIODARONE HCL IN DEXTROSE 360-4.14 MG/200ML-% IV SOLN: 30.0000 mg/h | INTRAVENOUS | Status: DC

## 2023-05-15 MED ORDER — VITAL 1.5 CAL PO LIQD
1000.0000 mL | ORAL | Status: DC
  Administered 2023-05-15 – 2023-05-17 (×3): 1000 mL

## 2023-05-15 MED ORDER — AMIODARONE HCL 200 MG PO TABS: 200.0000 mg | ORAL_TABLET | Freq: Two times a day (BID) | ORAL | Status: DC

## 2023-05-15 MED ORDER — THIAMINE HCL 100 MG PO TABS
100.0000 mg | ORAL_TABLET | Freq: Every day | ORAL | Status: DC
  Administered 2023-05-16 – 2023-05-18 (×3): 100 mg
  Filled 2023-05-15 (×6): qty 1

## 2023-05-15 MED ORDER — MUPIROCIN 2 % EX OINT
1.0000 | TOPICAL_OINTMENT | Freq: Two times a day (BID) | CUTANEOUS | Status: DC
  Administered 2023-05-15 – 2023-05-19 (×9): 1 via NASAL
  Filled 2023-05-15: qty 22

## 2023-05-15 MED ORDER — POTASSIUM CHLORIDE 20 MEQ PO PACK
40.0000 meq | PACK | Freq: Once | ORAL | Status: AC
  Administered 2023-05-15: 40 meq
  Filled 2023-05-15: qty 2

## 2023-05-15 MED ORDER — DEXTROSE 50 % IV SOLN
INTRAVENOUS | Status: AC
  Filled 2023-05-15: qty 50

## 2023-05-15 MED ORDER — JUVEN PO PACK
1.0000 | PACK | Freq: Two times a day (BID) | ORAL | Status: DC
  Administered 2023-05-16 – 2023-05-18 (×5): 1

## 2023-05-15 MED ORDER — INSULIN ASPART 100 UNIT/ML IJ SOLN
4.0000 [IU] | Freq: Once | INTRAMUSCULAR | Status: AC
  Administered 2023-05-15: 4 [IU] via SUBCUTANEOUS
  Filled 2023-05-15: qty 1

## 2023-05-15 MED ORDER — AMIODARONE HCL 200 MG PO TABS
100.0000 mg | ORAL_TABLET | Freq: Every day | ORAL | Status: DC
  Administered 2023-05-16 – 2023-05-18 (×3): 100 mg
  Filled 2023-05-15 (×3): qty 1

## 2023-05-15 MED ORDER — INSULIN ASPART 100 UNIT/ML IJ SOLN
0.0000 [IU] | INTRAMUSCULAR | Status: DC
  Administered 2023-05-15 (×2): 2 [IU] via SUBCUTANEOUS
  Administered 2023-05-16 (×3): 3 [IU] via SUBCUTANEOUS
  Administered 2023-05-16: 5 [IU] via SUBCUTANEOUS
  Administered 2023-05-16 – 2023-05-17 (×2): 3 [IU] via SUBCUTANEOUS
  Administered 2023-05-17: 5 [IU] via SUBCUTANEOUS
  Administered 2023-05-17: 8 [IU] via SUBCUTANEOUS
  Administered 2023-05-17 (×2): 3 [IU] via SUBCUTANEOUS
  Administered 2023-05-17: 5 [IU] via SUBCUTANEOUS
  Administered 2023-05-18: 3 [IU] via SUBCUTANEOUS
  Administered 2023-05-18: 5 [IU] via SUBCUTANEOUS
  Filled 2023-05-15 (×14): qty 1

## 2023-05-15 MED ORDER — DEXTROSE 50 % IV SOLN
12.5000 g | INTRAVENOUS | Status: AC
  Administered 2023-05-15: 12.5 g via INTRAVENOUS

## 2023-05-15 MED ORDER — FAMOTIDINE 20 MG PO TABS
20.0000 mg | ORAL_TABLET | Freq: Every day | ORAL | Status: DC
  Administered 2023-05-16 – 2023-05-18 (×3): 20 mg
  Filled 2023-05-15 (×3): qty 1

## 2023-05-15 MED ORDER — FREE WATER
30.0000 mL | Status: DC
  Administered 2023-05-15 – 2023-05-18 (×19): 30 mL

## 2023-05-15 MED ORDER — PROSOURCE TF20 ENFIT COMPATIBL EN LIQD
60.0000 mL | Freq: Every day | ENTERAL | Status: DC
  Administered 2023-05-16 – 2023-05-18 (×3): 60 mL
  Filled 2023-05-15: qty 60

## 2023-05-15 NOTE — Progress Notes (Signed)
 Initial Nutrition Assessment  DOCUMENTATION CODES:   Severe malnutrition in context of chronic illness  INTERVENTION:   Vital 1.5@50ml /hr- Initiate at 38ml/hr and increase by 45ml/hr q 8 hours until goal rate is reached.   ProSource TF 20- Give 60ml daily via tube, each supplement provides 80kcal and 20g of protein.   Free water flushes 30ml q4 hours to maintain tube patency   Regimen provides 1880kcal/day, 101g/day protein and 1061ml/day of free water.   Pt at high refeed risk; recommend monitor potassium, magnesium and phosphorus labs daily until stable  Juven Fruit Punch BID via tube, each serving provides 95kcal and 2.5g of protein (amino acids glutamine and arginine)  Thiamine 100mg  daily via tube x 7 days   Daily weights   NUTRITION DIAGNOSIS:   Severe Malnutrition related to chronic illness as evidenced by severe fat depletion, severe muscle depletion, percent weight loss.  GOAL:   Patient will meet greater than or equal to 90% of their needs  MONITOR:   Vent status, Labs, Weight trends, TF tolerance, Skin, I & O's  REASON FOR ASSESSMENT:   Consult Enteral/tube feeding initiation and management  ASSESSMENT:   78 y/o male with h/o CHF, HTN, CAD s/p circumflex stenting, BPH, Afib, renal mass, chronic LLE wound, DM, B12 deficiency, HLD, CKD III, NICM/ICM and stage III colon cancer (s/p descending and proximal sigmoid colectomy, left ureteral lysis and partial cecectomy on 06/23/2013/chemotherapy) who was admitted with PNA, sepsis, transaminitis, CHF and rhabdomyolysis complicated by V-fib cardiac arrest and AKI.  Pt sedated and ventilated. OGT in place. Will plan to initiate tube feeds today. Pt is at high refeed risk. Per chart, pt eating 20-80% of meals during his previous admission. Per chart, pt is down 19lbs(11%) over the past month; this is significant weight loss. Would recommend NGT placement prior to extubation.   Medications reviewed and include: colace,  pepcid, insulin, miralax, cefazolin, levophed, propofol   Labs reviewed: K 3.2(L), BUN 55(H), creat 2.55(H), P 3.2 wnl, Mg 2.3 wnl BNP- 2694.0(H) Wbc- 11.4(H) Cbgs- 110, 96, 68, 68, 96, 229 x 24 hrs  AIC 6.9(H)- 3/31  Patient is currently intubated on ventilator support MV: 9.7 L/min Temp (24hrs), Avg:96.8 F (36 C), Min:95.7 F (35.4 C), Max:99.3 F (37.4 C)  Propofol: 2.03 ml/hr- provides 54kcal/day   MAP >6mmHg   UOP-   NUTRITION - FOCUSED PHYSICAL EXAM:  Flowsheet Row Most Recent Value  Orbital Region Severe depletion  Upper Arm Region Severe depletion  Thoracic and Lumbar Region Severe depletion  Buccal Region Severe depletion  Temple Region Severe depletion  Clavicle Bone Region Severe depletion  Clavicle and Acromion Bone Region Severe depletion  Scapular Bone Region Severe depletion  Dorsal Hand Severe depletion  Patellar Region Severe depletion  Anterior Thigh Region Severe depletion  Posterior Calf Region Severe depletion  Edema (RD Assessment) None  Hair Reviewed  Eyes Reviewed  Mouth Reviewed  Skin Reviewed  Nails Reviewed   Diet Order:   Diet Order             Diet NPO time specified  Diet effective now                  EDUCATION NEEDS:   No education needs have been identified at this time  Skin:  Skin Assessment: Reviewed RN Assessment (L foot wound: 1 cm x 0.7 cm x 0.2 cm)  Last BM:  4/6  Height:   Ht Readings from Last 1 Encounters:  05/14/23 5\' 9"  (  1.753 m)    Weight:   Wt Readings from Last 1 Encounters:  05/15/23 67 kg    Ideal Body Weight:  72.7 kg  BMI:  Body mass index is 21.81 kg/m.  Estimated Nutritional Needs:   Kcal:  1674kcal/day  Protein:  100-115g/day  Fluid:  1.7-2.0L/day  Betsey Holiday MS, RD, LDN If unable to be reached, please send secure chat to "RD inpatient" available from 8:00a-4:00p daily

## 2023-05-15 NOTE — Progress Notes (Signed)
 PHARMACY - ANTICOAGULATION CONSULT NOTE  Pharmacy Consult for Heparin  Indication: atrial fibrillation - transitioning from warfarin   Allergies  Allergen Reactions   Shellfish Allergy Other (See Comments) and Hives    Pt. instructed by MD to avoid seafood    Patient Measurements: Height: 5\' 9"  (175.3 cm) Weight: 67 kg (147 lb 11.3 oz) IBW/kg (Calculated) : 70.7  Vital Signs: Temp: 96.6 F (35.9 C) (04/09 0415) Temp Source: Bladder (04/09 0400) BP: 119/64 (04/09 0415) Pulse Rate: 65 (04/09 0415)  Labs: Recent Labs    05/13/23 0510 05/13/23 0510 05/14/23 0421 05/14/23 1823 05/14/23 2021 05/14/23 2228 05/15/23 0027 05/15/23 0342  HGB 13.1  --   --  12.0*  --   --   --  12.4*  HCT 39.1  --   --  38.0*  --   --   --  37.5*  PLT 177  --   --  198  --   --   --  216  LABPROT 46.4*  --  29.3*  --  25.1*  --   --  25.5*  INR 5.0*  --  2.8*  --  2.3*  --   --  2.3*  CREATININE 2.33*  --  2.42* 2.81*  --   --   --  2.55*  TROPONINIHS  --    < >  --  52* 160* 384* 605* 790*   < > = values in this interval not displayed.    Estimated Creatinine Clearance: 23.4 mL/min (A) (by C-G formula based on SCr of 2.55 mg/dL (H)).   Medical History: Past Medical History:  Diagnosis Date   Adenocarcinoma of colon (HCC) 06/19/2013   a. moderately differentiated stage IIIc (T4bN2a M0) adenocarcinoma of colon  s/p colectomy followed by chemoRx with oxaliplatin & fluorouacil.   Anemia    CAD (coronary artery disease)    a. 02/2015 Cath: LM nl, LAD 50/40 mid/distal, LCX 80p, RCA 40p, 30d, RPL1 40, CO 3.27, CI 1.65; b. 06/09/15 PCI: LM minor irregs, m-dLAD 50%, dLAD 40%, pLCx 80% (s/p PCI/DES 0%), pRCA 40%, dRCA 30%, 1st RPLB 40%   Chronic kidney disease, stage 3a (HCC)    Chronic systolic CHF (congestive heart failure) (HCC)    a. 02/2015 Echo: EF 20-25%, diff HK, mod MR, mildy dil LA, mildly dil RV w/ mod RV syst dysfxn, mildly dil RA, mod TR, PASP ; b. 09/2017 Echo: EF 40-45%, diff HK.  Gr1 DD. Mild to mod MR. Mildly to mod dil LA; c. 09/2019 Echo: EF 25-30%, glob HK, mod reduced RV fxn, sev elev PASP. Mod dil LA. Mild-mod MR; d. 05/2021 Echo: EF 20-25%, glob HK, sev red RV fxn, sev BAE, sev MR/TR/TS, triv AI.   Diabetes mellitus without complication (HCC)    Essential hypertension    Hypertensive heart disease    Left inguinal hernia 05/2020   Mixed Ischemic and Non-ischemic Cardiomyopathy    a. 02/2015 EF 20-25%, diff HK; b. 09/2016 EF 40-45%; c. 09/2019 EF 25-30%; d. 05/2021 Echo: EF 20-25%, glob HK.   Persistent atrial fibrillation (HCC)    a. Dx 05/2021-->[id number] = 5-->eliquis.   Pulmonary hypertension (HCC)    Severe mitral regurgitation    a. 09/2017 Echo: Mild to mod MR; b. 09/2019 Echo: mild-mod MR; c. 05/2021 Echo: Sev MR/TR/TS.   Tubular adenoma of colon     Medications:  Medications Prior to Admission  Medication Sig Dispense Refill Last Dose/Taking   atorvastatin (LIPITOR) 40 MG tablet TAKE  1 TABLET(40 MG) BY MOUTH DAILY 90 tablet 3 05/14/2023   carvedilol (COREG) 3.125 MG tablet TAKE 1 TABLET BY MOUTH 2 TIMES DAILY WITH A MEAL 180 tablet 2 05/14/2023   cholecalciferol (VITAMIN D3) 25 MCG (1000 UNIT) tablet Take 1,000 Units by mouth daily.   05/14/2023   FARXIGA 10 MG TABS tablet TAKE 1 TABLET(10 MG) BY MOUTH DAILY BEFORE BREAKFAST 30 tablet 6 05/14/2023   ferrous sulfate 325 (65 FE) MG tablet Take 325 mg by mouth daily with breakfast.   05/14/2023   gabapentin (NEURONTIN) 100 MG capsule Take 100 mg by mouth 3 (three) times daily. Taking 2 -100 mg pills in am and 1 -100 mg at night   05/14/2023   loratadine (CLARITIN) 10 MG tablet TAKE 1 TABLET(10 MG) BY MOUTH DAILY 30 tablet 11 05/14/2023   Magnesium Oxide 400 MG CAPS Take 1 capsule (400 mg total) by mouth daily. 90 capsule 3 05/14/2023   [START ON 05/16/2023] torsemide (DEMADEX) 20 MG tablet Take 40 mg one day then 20 mg the next day, so continue to alternate 40mg  to 20mg  every other day as per cardio   05/14/2023   amiodarone  (PACERONE) 200 MG tablet Take 1 tablet (200 mg total) by mouth 2 (two) times daily. 60 tablet 0    Blood Glucose Monitoring Suppl DEVI 1 each by Does not apply route daily. May substitute to any manufacturer covered by patient's insurance. 1 each 0    ceFAZolin (ANCEF) IVPB Inject 2 g into the vein every 12 (twelve) hours. Indication:  MSSA bacteremia and cervical spine abscess First Dose: Yes Last Day of Therapy:  06/14/2023 Labs - Once weekly:  CBC/D, CMP, ESR and CRP Fax weekly lab results  promptly to [phone]  and [phone] Method of administration: IV Push Method of administration may be changed at the discretion of home infusion pharmacist based upon assessment of the patient and/or caregiver's ability to self-administer the medication ordered. Please pull PIC at completion of IV antibiotics Call [phone] with critical value or questions 68 Units 0    Glucose Blood (BLOOD GLUCOSE TEST STRIPS) STRP 1 each by In Vitro route daily as needed. May substitute to any manufacturer covered by patient's insurance. 100 strip 0    HYDROcodone-acetaminophen (NORCO/VICODIN) 5-325 MG tablet Take 1-2 tablets by mouth every 6 (six) hours as needed for up to 1 day for moderate pain (pain score 4-6) or severe pain (pain score 7-10). 8 tablet 0    Lancet Device MISC 1 each by Does not apply route daily as needed. May substitute to any manufacturer covered by patient's insurance. 1 each 0    Lancets Misc. MISC 1 each by Does not apply route daily as needed. May substitute to any manufacturer covered by patient's insurance. 100 each 0    spironolactone (ALDACTONE) 25 MG tablet Take 0.5 tablets (12.5 mg total) by mouth daily. (Patient not taking: Reported on 05/14/2023) 15 tablet 0 Not Taking   warfarin (COUMADIN) 5 MG tablet TAKE 1/2 TO 1 TABLET BY MOUTH EVERY DAY AS DIRECTED BY COUMADIN CLINIC (Patient not taking: Reported on 05/14/2023) 70 tablet 1 Not Taking    Assessment: Pharmacy consulted to dose  heparin for Afib in this 78 yo on warfarin PTA.     4/8:  INR @ 2021 = 2.3, therapeutic  4/9:  INR @ 0342 = 2.3, therapeutic   CrCl = 21.4 ml/min  Goal of Therapy:  Heparin level 0.3-0.7 units/ml   Plan:  -  Will start heparin (950 units/hr + 4000 units bolus) once INR < 2 4/8:  INR @ 2021 = 2.3, therapeutic 4/9:  INR @ 0342 = 2.3, therapeutic  - check INR again on 4/9 @ 1600 - CBC daily   Muadh Creasy D 05/15/2023,4:30 AM

## 2023-05-15 NOTE — Telephone Encounter (Signed)
 Patient is still admitted.

## 2023-05-15 NOTE — Consult Note (Addendum)
 Advanced Heart Failure Team Consult Note   Primary Physician: Glori Luis, MD (Inactive) Cardiologist:  Lorine Bears, MD  Reason for Consultation: Cardiac arrest  HPI:    [name]. is seen today for evaluation of cardiac arrest at the request of Dr. Belia Heman.   [name] is a 78 year old with CAD status circumflex stenting, mixed ICM and NICM, HTN, HLD, CKD Stage III, colon cancer, chronic LLE wound, and Biventricular HF.    Followed by Dr Gala Romney in the HF clinic 04/16/23. ICD was discussed but he was not interested. Sherryll Burger was cost prohibitive. He has some edema but wanted to continue on torsemide 20 mg daily.     Prior to admit he had several days of weakness. His wife found him on the floor 3/28. Unwitnessed fall. He has has no idea what happened.    Initially admitted 05/03/23 after his wife found him on the floor. CT head negative. CT spine - preverterbral fluid. MRI thin epidural fluid collection , stenosis C3-C6, and possible ligamentous injury.  Pertinent admission labs include creatinine 2.9 Lactic acid >9-->2.2, bnp >1500.  CK Blood CX + MSSA, respiratory panel negative.  Placed on ancef. Neurosurgery consulted. Echo showed EF 20-25% with severely reduced RV and dilated IVC. Was in a fib RVR which improved with amio gtt. With MSSA bacteremia he underwent TEE/DCCV 4/1 which showed EF 20-25%, GHK, mild RV dilatation and severe dysfunction, mild-mod Christopher, mild TR, no endocarditis. DCCV with successful conversion to NSR. Diuresed well with IV lasix. Was able to start on some GDMT. Deemed stable for discharge for SNF.    Unfortunately while Patient was on the stretcher for transport to SNF he suffered a cardiac arrest. Initially with faint pulses, CPR immediately started. Once pads placed rhythm showed VF. Shocked x1> asystole. Compressions resumed and ROSC achieved after 2nd Epi administered. Transferred back to ED for readmission. Admission labs reviewed: SCr 2.81, K  3.8, BNP 2.6K, HsTrop 52>160>384>605>790, Hgb 12, lactic acid 4.3>1.4. Head CT with no acute findings.   Patient remains intubated this morning. Sedation was weaned but he's not following commands at this time. Currently on NE at 15/hr.    Home Medications Prior to Admission medications   Medication Sig Start Date End Date Taking? Authorizing Provider  atorvastatin (LIPITOR) 40 MG tablet TAKE 1 TABLET(40 MG) BY MOUTH DAILY 12/21/22  Yes Iran Ouch, MD  carvedilol (COREG) 3.125 MG tablet TAKE 1 TABLET BY MOUTH 2 TIMES DAILY WITH A MEAL 03/25/23  Yes Iran Ouch, MD  cholecalciferol (VITAMIN D3) 25 MCG (1000 UNIT) tablet Take 1,000 Units by mouth daily.   Yes [provider]  FARXIGA 10 MG TABS tablet TAKE 1 TABLET(10 MG) BY MOUTH DAILY BEFORE BREAKFAST 12/26/22  Yes Mariella Blackwelder, Bevelyn Buckles, MD  ferrous sulfate 325 (65 FE) MG tablet Take 325 mg by mouth daily with breakfast.   Yes [provider]  gabapentin (NEURONTIN) 100 MG capsule Take 100 mg by mouth 3 (three) times daily. Taking 2 -100 mg pills in am and 1 -100 mg at night 04/12/21 05/14/23 Yes [provider]  loratadine (CLARITIN) 10 MG tablet TAKE 1 TABLET(10 MG) BY MOUTH DAILY 05/25/22  Yes Glori Luis, MD  Magnesium Oxide 400 MG CAPS Take 1 capsule (400 mg total) by mouth daily. 12/12/21  Yes Creig Hines, NP  torsemide (DEMADEX) 20 MG tablet Take 40 mg one day then 20 mg the next day, so continue to  alternate 40mg  to 20mg  every other day as per cardio 05/16/23  Yes Charise Killian, MD  amiodarone (PACERONE) 200 MG tablet Take 1 tablet (200 mg total) by mouth 2 (two) times daily. 05/14/23 06/13/23  Charise Killian, MD  Blood Glucose Monitoring Suppl DEVI 1 each by Does not apply route daily. May substitute to any manufacturer covered by patient's insurance. 01/23/23   Glori Luis, MD  ceFAZolin (ANCEF) IVPB Inject 2 g into the vein every 12 (twelve) hours. Indication:  MSSA  bacteremia and cervical spine abscess First Dose: Yes Last Day of Therapy:  06/14/2023 Labs - Once weekly:  CBC/D, CMP, ESR and CRP Fax weekly lab results  promptly to [phone]  and [phone] Method of administration: IV Push Method of administration may be changed at the discretion of home infusion pharmacist based upon assessment of the patient and/or caregiver's ability to self-administer the medication ordered. Please pull PIC at completion of IV antibiotics Call [phone] with critical value or questions 05/14/23 06/17/23  Charise Killian, MD  Glucose Blood (BLOOD GLUCOSE TEST STRIPS) STRP 1 each by In Vitro route daily as needed. May substitute to any manufacturer covered by patient's insurance. 01/23/23   Glori Luis, MD  HYDROcodone-acetaminophen (NORCO/VICODIN) 5-325 MG tablet Take 1-2 tablets by mouth every 6 (six) hours as needed for up to 1 day for moderate pain (pain score 4-6) or severe pain (pain score 7-10). 05/14/23 05/15/23  Charise Killian, MD  Lancet Device MISC 1 each by Does not apply route daily as needed. May substitute to any manufacturer covered by patient's insurance. 01/23/23   Glori Luis, MD  Lancets Misc. MISC 1 each by Does not apply route daily as needed. May substitute to any manufacturer covered by patient's insurance. 01/23/23   Glori Luis, MD  spironolactone (ALDACTONE) 25 MG tablet Take 0.5 tablets (12.5 mg total) by mouth daily. Patient not taking: Reported on 05/14/2023 05/15/23 06/14/23  Charise Killian, MD  warfarin (COUMADIN) 5 MG tablet TAKE 1/2 TO 1 TABLET BY MOUTH EVERY DAY AS DIRECTED BY COUMADIN CLINIC Patient not taking: Reported on 05/14/2023 12/26/22   Iran Ouch, MD    Past Medical History: Past Medical History:  Diagnosis Date   Adenocarcinoma of colon (HCC) 06/19/2013   a. moderately differentiated stage IIIc (T4bN2a M0) adenocarcinoma of colon  s/p colectomy followed by chemoRx with oxaliplatin &  fluorouacil.   Anemia    CAD (coronary artery disease)    a. 02/2015 Cath: LM nl, LAD 50/40 mid/distal, LCX 80p, RCA 40p, 30d, RPL1 40, CO 3.27, CI 1.65; b. 06/09/15 PCI: LM minor irregs, m-dLAD 50%, dLAD 40%, pLCx 80% (s/p PCI/DES 0%), pRCA 40%, dRCA 30%, 1st RPLB 40%   Chronic kidney disease, stage 3a (HCC)    Chronic systolic CHF (congestive heart failure) (HCC)    a. 02/2015 Echo: EF 20-25%, diff HK, mod Christopher, mildy dil LA, mildly dil RV w/ mod RV syst dysfxn, mildly dil RA, mod TR, PASP ; b. 09/2017 Echo: EF 40-45%, diff HK. Gr1 DD. Mild to mod Christopher. Mildly to mod dil LA; c. 09/2019 Echo: EF 25-30%, glob HK, mod reduced RV fxn, sev elev PASP. Mod dil LA. Mild-mod Christopher; d. 05/2021 Echo: EF 20-25%, glob HK, sev red RV fxn, sev BAE, sev Christopher/TR/TS, triv AI.   Diabetes mellitus without complication (HCC)    Essential hypertension    Hypertensive heart disease    Left inguinal hernia 05/2020  Mixed Ischemic and Non-ischemic Cardiomyopathy    a. 02/2015 EF 20-25%, diff HK; b. 09/2016 EF 40-45%; c. 09/2019 EF 25-30%; d. 05/2021 Echo: EF 20-25%, glob HK.   Persistent atrial fibrillation (HCC)    a. Dx 05/2021-->CHA2DS2VASc = 5-->eliquis.   Pulmonary hypertension (HCC)    Severe mitral regurgitation    a. 09/2017 Echo: Mild to mod Christopher; b. 09/2019 Echo: mild-mod Christopher; c. 05/2021 Echo: Sev Christopher/TR/TS.   Tubular adenoma of colon     Past Surgical History: Past Surgical History:  Procedure Laterality Date   APPENDECTOMY N/A 06/19/2017   Location: [facility]; Surgeon: Claude Manges, MD   CARDIAC CATHETERIZATION Bilateral 02/24/2015   Procedure: Right/Left Heart Cath and Coronary Angiography;  Surgeon: Iran Ouch, MD;  Location: [facility] INVASIVE CV LAB;  Service: Cardiovascular;  Laterality: Bilateral;   CARDIAC CATHETERIZATION N/A 06/09/2015   Procedure: Coronary Stent Intervention (3.0 x 12 mm Xience Alpine DES to LCx);  Surgeon: Iran Ouch, MD;  Location: [facility] INVASIVE CV LAB;  Service: Cardiovascular;   Laterality: N/A   COLECTOMY  06/19/2017   Descending and proximal sigmoid colectomy, LEFT ureterolysis, partial cecectomy, appendectomy; Location: [facility]; Surgeon: Claude Manges, MD   COLONOSCOPY     COLONOSCOPY WITH PROPOFOL N/A 12/11/2016   Procedure: COLONOSCOPY WITH PROPOFOL;  Surgeon: Christena Deem, MD;  Location: [facility] ENDOSCOPY;  Service: Endoscopy;  Laterality: N/A;   ESOPHAGOGASTRODUODENOSCOPY     HERNIA REPAIR     PORT-A-CATH REMOVAL N/A 07/12/2014   Procedure: REMOVAL PORT-A-CATH;  Surgeon: Duwaine Maxin, MD;  Location: [facility] ORS;  Service: General;  Laterality: N/A;   PORTACATH PLACEMENT Left 2015   TEE WITHOUT CARDIOVERSION N/A 05/07/2023   Procedure: ECHOCARDIOGRAM, TRANSESOPHAGEAL;  Surgeon: Laurey Morale, MD;  Location: [facility] ORS;  Service: Cardiovascular;  Laterality: N/A;    Family History: Family History  Problem Relation Age of Onset   Cancer Mother    Other Father        unknown medical history    Social History: Social History   Socioeconomic History   Marital status: Married    Spouse name: Carney Bern   Number of children: 4   Years of education: Not on file   Highest education level: Not on file  Occupational History   Occupation: retired  Tobacco Use   Smoking status: Never   Smokeless tobacco: Never  Vaping Use   Vaping status: Never Used  Substance and Sexual Activity   Alcohol use: Not Currently    Comment: none in many years   Drug use: No   Sexual activity: Yes  Other Topics Concern   Not on file  Social History Narrative   Not on file   Social Drivers of Health   Financial Resource Strain: Low Risk  (10/17/2022)   Overall Financial Resource Strain (CARDIA)    Difficulty of Paying Living Expenses: Not hard at all  Food Insecurity: No Food Insecurity (05/04/2023)   Hunger Vital Sign    Worried About Running Out of Food in the Last Year: Never true    Ran Out of Food in the Last Year: Never true  Transportation Needs: No  Transportation Needs (05/04/2023)   PRAPARE - Administrator, Civil Service (Medical): No    Lack of Transportation (Non-Medical): No  Physical Activity: Sufficiently Active (10/17/2022)   Exercise Vital Sign    Days of Exercise per Week: 4 days    Minutes of Exercise per Session: 60 min  Stress: No Stress  Concern Present (10/17/2022)   [employer] of Occupational Health - Occupational Stress Questionnaire    Feeling of Stress : Not at all  Social Connections: Patient Unable To Answer (05/14/2023)   Social Connection and Isolation Panel [NHANES]    Frequency of Communication with Friends and Family: Patient unable to answer    Frequency of Social Gatherings with Friends and Family: Patient unable to answer    Attends Religious Services: Patient unable to answer    Active Member of Clubs or Organizations: Patient unable to answer    Attends Banker Meetings: Patient unable to answer    Marital Status: Patient unable to answer  Recent Concern: Social Connections - Moderately Isolated (05/04/2023)   Social Connection and Isolation Panel [NHANES]    Frequency of Communication with Friends and Family: Never    Frequency of Social Gatherings with Friends and Family: Once a week    Attends Religious Services: More than 4 times per year    Active Member of [employer] or Organizations: No    Attends Banker Meetings: Never    Marital Status: Married   Allergies:  Allergies  Allergen Reactions   Shellfish Allergy Other (See Comments) and Hives    Pt. instructed by MD to avoid seafood   Objective:    Vital Signs:   Temp:  [95.7 F (35.4 C)-99.3 F (37.4 C)] 96.6 F (35.9 C) (04/09 0615) Pulse Rate:  [1-103] 59 (04/09 0615) Resp:  [6-27] 23 (04/09 0615) BP: (43-160)/(30-89) 116/66 (04/09 0615) SpO2:  [89 %-100 %] 100 % (04/09 0615) FiO2 (%):  [50 %-80 %] 50 % (04/09 0400) Weight:  [67 kg-67.5 kg] 67 kg (04/09 0415)    Weight change: Filed Weights    05/14/23 2130 05/15/23 0415  Weight: 67.5 kg 67 kg    Intake/Output:   Intake/Output Summary (Last 24 hours) at 05/15/2023 0732 Last data filed at 05/15/2023 0600 Gross per 24 hour  Intake 809.56 ml  Output 715 ml  Net 94.56 ml    CVP 3-4 Physical Exam  General:  elderly, frail appearing.   HEENT: +ETT, +OG Neck: supple. JVD flat.  Cor: PMI nondisplaced. Regular rate & rhythm. No rubs, gallops or murmurs. Lungs: clear Extremities: no cyanosis, clubbing, rash, edema. PICC LUE Neuro: does not follow commands. With non-purposeful movement.   Telemetry   NSR 50s-60s (Personally reviewed)    EKG    NSR with PVCs 72 bpm QTc 585 (05/14/23)  Labs   Basic Metabolic Panel: Recent Labs  Lab 05/12/23 0640 05/13/23 0510 05/14/23 0421 05/14/23 1823 05/15/23 0342  NA 137 135 137 135 135  K 3.9 3.6 3.5 3.8 3.2*  CL 98 97* 97* 95* 97*  CO2 29 29 30 26 30   GLUCOSE 152* 132* 134* 218* 104*  BUN 51* 55* 54* 58* 55*  CREATININE 2.34* 2.33* 2.42* 2.81* 2.55*  CALCIUM 8.5* 8.4* 8.6* 8.3* 8.7*  MG  --   --   --   --  2.3  PHOS  --   --   --   --  3.2    Liver Function Tests: Recent Labs  Lab 05/14/23 0421 05/14/23 1823 05/15/23 0342  AST 25 41 31  ALT <5 5 <5  ALKPHOS 105 141* 125  BILITOT 1.5* 1.5* 1.4*  PROT 6.5 6.4* 6.8  ALBUMIN 2.2* 2.2* 2.3*   No results for input(s): "LIPASE", "AMYLASE" in the last 168 hours. No results for input(s): "AMMONIA" in the last 168 hours.  CBC: Recent Labs  Lab 05/10/23 0622 05/11/23 1000 05/11/23 2200 05/12/23 0640 05/13/23 0510 05/14/23 1823 05/15/23 0342  WBC 6.0 6.4 7.1 6.5 7.6 9.2 11.4*  NEUTROABS 4.6 5.1 5.6 4.9  --  7.0  --   HGB 11.6* 11.7* 12.2* 12.8* 13.1 12.0* 12.4*  HCT 35.3* 36.6* 38.2* 39.1 39.1 38.0* 37.5*  MCV 99.2 101.1* 101.3* 100.3* 96.5 102.4* 97.7  PLT 129* 136* 143* 152 177 198 216    Cardiac Enzymes: Recent Labs  Lab 05/08/23 2019  CKTOTAL 160    BNP: BNP (last 3 results) Recent Labs     12/13/22 1140 04/16/23 1541 05/14/23 1823  BNP 2,083.6* 1,510.6* 2,694.0*    ProBNP (last 3 results) No results for input(s): "PROBNP" in the last 8760 hours.   CBG: Recent Labs  Lab 05/14/23 1136 05/14/23 1550 05/14/23 2130 05/15/23 0014 05/15/23 0344  GLUCAP 220* 174* 229* 316* 96    Coagulation Studies: Recent Labs    05/13/23 0510 05/14/23 0421 05/14/23 2021 05/15/23 0342  LABPROT 46.4* 29.3* 25.1* 25.5*  INR 5.0* 2.8* 2.3* 2.3*   Imaging   CT HEAD WO CONTRAST ( ) Result Date: 05/14/2023 CLINICAL DATA:  Initial evaluation for acute mental status change, cardiac arrest. EXAM: CT HEAD WITHOUT CONTRAST TECHNIQUE: Contiguous axial images were obtained from the base of the skull through the vertex without intravenous contrast. RADIATION DOSE REDUCTION: This exam was performed according to the departmental dose-optimization program which includes automated exposure control, adjustment of the mA and/or kV according to patient size and/or use of iterative reconstruction technique. COMPARISON:  CT from 05/03/2023. FINDINGS: Brain: Cerebral volume within normal limits. Patchy hypodensity involving the supratentorial cerebral white matter, most consistent with chronic small vessel ischemic disease, moderately advanced in nature. No acute intracranial hemorrhage. No acute large vessel territory infarct. No mass lesion, midline shift or mass effect no hydrocephalus or extra-axial fluid collection. Vascular: No abnormal hyperdense vessel. Calcified atherosclerosis present at the skull base. Skull: Scalp soft tissues demonstrate no acute finding. Calvarium intact. Sinuses/Orbits: Globes orbital soft tissues within normal limits. Scattered mucosal thickening noted about the paranasal sinuses. No significant mastoid effusion. Patient is intubated. Other: None. IMPRESSION: 1. No acute intracranial abnormality. 2. Moderately advanced chronic microvascular ischemic disease. Electronically Signed    By: Rise Mu M.D.   On: 05/14/2023 23:16   DG Abd Portable 1V Result Date: 05/14/2023 CLINICAL DATA:  Enteric tube placement EXAM: PORTABLE ABDOMEN - 1 VIEW COMPARISON:  CT abdomen and pelvis dated 05/03/2023 FINDINGS: Gastric/enteric tube tip projects over the stomach. Side-hole projects 3.2 cm inferior to the gastroesophageal junction. Mild gas-filled dilation of the stomach. IMPRESSION: Gastric/enteric tube tip projects over the stomach. Electronically Signed   By: Agustin Cree M.D.   On: 05/14/2023 20:33   DG Chest Port 1 View Result Date: 05/14/2023 CLINICAL DATA:  Intubated EXAM: PORTABLE CHEST 1 VIEW COMPARISON:  05/06/2023 FINDINGS: Endotracheal tube tip is about 2.5 cm superior to carina. Left upper extremity central venous catheter tip of superimposes the cavoatrial region. Cardiomegaly. Dense left lower lobe consolidation and probable small left effusion. Improved aeration right base compared to prior. Aortic atherosclerosis. No pneumothorax IMPRESSION: 1. Endotracheal tube tip about 2.5 cm superior to carina 2. Cardiomegaly with small left-sided effusion and dense left lung base consolidation similar compared to prior 3. Improved aeration at the right base Electronically Signed   By: Jasmine Pang M.D.   On: 05/14/2023 20:33    Medications:   Current Medications:  Chlorhexidine Gluconate  Cloth  6 each Topical Daily   docusate  100 mg Per Tube BID   famotidine  20 mg Per Tube BID   insulin aspart  0-15 Units Subcutaneous Q4H   mupirocin ointment  1 Application Nasal BID   mouth rinse  15 mL Mouth Rinse Q2H   polyethylene glycol  17 g Per Tube Daily   potassium chloride  40 mEq Per Tube Once    Infusions:  sodium chloride Stopped (05/14/23 2308)   amiodarone     amiodarone      ceFAZolin (ANCEF) IV Stopped (05/14/23 2251)   fentaNYL infusion INTRAVENOUS Stopped (05/14/23 2200)   norepinephrine (LEVOPHED) Adult infusion 15 mcg/min (05/15/23 0600)   propofol (DIPRIVAN)  infusion     vasopressin Stopped (05/14/23 1936)    Patient Profile   [name] is a 78 year old with CAD status circumflex stenting, mixed ICM and NICM, HTN, HLD, CKD Stage III, colon cancer, and Biventricular HF. Initially admitted post fall and with MSSA bacteremia. On discharge stretcher to SNF he suffered VF arrest.   Assessment/Plan  VF arrest Mixed cardiogenic / septic shock post arrest - Lactic acid 4.3>1.4 - HsTrop 52>160>384>605>790. Demand ischemia post arrest - Keep K >4 and Mg >2 - QTc on EKG yesterday 585. Stat repeat today. Restart amiodarone at reduced dose 100 mg daily. Follow QT closely - Continue pressor support (currently on NE @ 15/hr) for SBP >90 and MAP >65 - Will reach out to EP today - Previously did not want ICD - Not candidate for ICD currently with infection. Will need to discuss LifeVest - Consider cath as below  2. Acute on chronic systolic CHF: With recent mixed cardiogenic/septic shock Primarily NiCM.  He has refused ICD.  He has had trouble getting his HF medications.   - Echo 4/25 unchanged from past with EF 20-25%, global HK, moderate LVH, severe RV dysfunction, mild-moderate Christopher, moderate-severe TR, IVC dilated. He looks dry on exam . - NYHA IV on admission post arrest.   - Volume status ok. Does not appear to need additional diuretic at this time. CVP 3-4 - GDMT on hold with elevated renal function and pressor requirements.   3. CAD with elevated troponin/NSTEMI - suspect primarily demand ischemia given low level trop elevation with arrest but given primary VF arrest will need ischemic eval if SCr permits - last cath 2017 with LAD 50%, 40% LCx 80% prox -> stent RCA 40%/30%/40%  4. MSSA bacteremia: Source is likely LLE wound (growing MSSA), also has C4/C5 prevertebral collection concerning for discitis. TEE with no endocarditis.  ID saw.  - Continue cefazolin x 6 wks.   5. AKI on CKD stage 3: Baseline creatinine around 1.7. Suspect due to septic  shock with cardiorenal component. SCr 2.4>2.8>2.55 today  6. Atrial fibrillation: Persistent. S/P DC-CV 05/07/22 with conversion to SR. Remains in NSR today.  - Restart amiodarone at reduced dose 100 mg daily.  - INR 2.3. Recheck INR. If <2 can start heparin gtt.  - PharmD suggesting possible change to apixaban 2.5 bid at discharge (I think this would be safer for him)   7. Chronic LLE wound: Suspect source for MSSA bacteremia.  - improving  8. C-spine prevertebral edema: Upper c-spine with edema in the C4-C5 disc, ?infectious. No drainable fluid collection per IR.  - He is in a c-spine collar.  - Neurosurgery following, no plan for surgery.   CRITICAL CARE Performed by: Alen Bleacher   Total critical care time:  18 minutes  Critical care time was exclusive of separately billable procedures and treating other patients.  Critical care was necessary to treat or prevent imminent or life-threatening deterioration.  Critical care was time spent personally by me on the following activities: development of treatment plan with patient and/or surrogate as well as nursing, discussions with consultants, evaluation of patient's response to treatment, examination of patient, obtaining history from patient or surrogate, ordering and performing treatments and interventions, ordering and review of laboratory studies, ordering and review of radiographic studies, pulse oximetry and re-evaluation of patient's condition.   Length of Stay: 1  Alen Bleacher, NP  05/15/2023, 7:32 AM  Advanced Heart Failure Team Pager [phone] (M-F; 7a - 5p)  Please contact [facility] for night-coverage after hours (4p -7a ) and weekends on [email or web address]   Agree with above.   78 y/o male frail male with severe systolic HF due to mixed iCM/NICM EF 20-25%. Recently admitted with shock in setting of MSSA bacteremia with likely seeding of C-spine. During that admit also underwent DC-CV for AF and started on amio.   Was being  discharged yesterday when he had VF arrest. Received immediate CPR and defibrillation   Now intubated/sedated. Lactic acid has cleared. On NE 15. Co-ox 83% CVP 3  hstrop peak 790   In  sinus brady QTC 585   General:  sedated on vent  HEENT: + ETT Neck: + neck collar .  Cor:. Regular rate & rhythm. No rubs, gallops or murmurs. Lungs: clear Abdomen: soft, nontender, nondistended. No hepatosplenomegaly. No bruits or masses. Good bowel sounds. Extremities: no cyanosis, clubbing, rash, edema healing wounds LLE>RLE Neuro: intubated/sedated  He is now s/p VF arrest. Ideally would have ischemic eval if SCr improves. Wean NE as tolerated. Keep K > 4.0 Mg > 2.0   Will continue low-dose amio for now. (Wil d/w EP)   Ideally will need ICD but he has refused in past and recent infectious issues preclude. Can consider LifeVest  Will need GOC discussions with patient and family.   CRITICAL CARE Performed by: Arvilla Meres  Total critical care time: 55 minutes  Critical care time was exclusive of separately billable procedures and treating other patients.  Critical care was necessary to treat or prevent imminent or life-threatening deterioration.  Critical care was time spent personally by me (independent of midlevel providers or residents) on the following activities: development of treatment plan with patient and/or surrogate as well as nursing, discussions with consultants, evaluation of patient's response to treatment, examination of patient, obtaining history from patient or surrogate, ordering and performing treatments and interventions, ordering and review of laboratory studies, ordering and review of radiographic studies, pulse oximetry and re-evaluation of patient's condition.  Arvilla Meres, MD  10:52 AM

## 2023-05-15 NOTE — Consult Note (Signed)
 Consultation Note Date: 05/15/2023   Patient Name: [name].  DOB: July 21, 1945  MRN: [id number]  Age / Sex: 78 y.o., male  PCP: Glori Luis, MD (Inactive) Referring Physician: Erin Fulling, MD  Reason for Consultation: Establishing goals of care  HPI/Patient Profile: Per H&P: 78 yo M presenting to [facility] ED on 05/14/23 for evaluation after cardiac arrest.   History obtained per chart review as patient unable to participate in interview at this time. Patient was being taken down in the elevator after being discharged from the hospital when he became unresponsive and agonal, no palpable pulses in V-fib arrest. Patient defibrillated with 200 J with subsequent asystole, ACLS continued with ROSC after 5 minutes. Patient transported to ED.  Clinical Assessment and Goals of Care: Notes and labs reviewed from current and last admission. Into see patient.  Wife at bedside.  She states they have 2 children.  Worked on the Electronics engineer.  She states prior to last admission her husband was doing pretty well.  She discusses this fall where he could not get up and then his admission.  She discusses his discharge and return here to the hospital.  She discusses his current status.  Wife discusses her faith in God and the questions that she has. Gaynelle Adu discusses God sovereignty. She shares her thoughts and feelings.  Questions were answered as able.    SUMMARY OF RECOMMENDATIONS   Wife would like a little bit of time to think and process this.  PMT will follow-up tomorrow.       Primary Diagnoses: Present on Admission:  Cardiac arrest [facility])   I have reviewed the medical record, interviewed the patient and family, and examined the patient. The following aspects are pertinent.  Past Medical History:  Diagnosis Date   Adenocarcinoma of colon (HCC) 06/19/2013   a. moderately differentiated stage IIIc  (T4bN2a M0) adenocarcinoma of colon  s/p colectomy followed by chemoRx with oxaliplatin & fluorouacil.   Anemia    CAD (coronary artery disease)    a. 02/2015 Cath: LM nl, LAD 50/40 mid/distal, LCX 80p, RCA 40p, 30d, RPL1 40, CO 3.27, CI 1.65; b. 06/09/15 PCI: LM minor irregs, m-dLAD 50%, dLAD 40%, pLCx 80% (s/p PCI/DES 0%), pRCA 40%, dRCA 30%, 1st RPLB 40%   Chronic kidney disease, stage 3a (HCC)    Chronic systolic CHF (congestive heart failure) (HCC)    a. 02/2015 Echo: EF 20-25%, diff HK, mod MR, mildy dil LA, mildly dil RV w/ mod RV syst dysfxn, mildly dil RA, mod TR, PASP ; b. 09/2017 Echo: EF 40-45%, diff HK. Gr1 DD. Mild to mod MR. Mildly to mod dil LA; c. 09/2019 Echo: EF 25-30%, glob HK, mod reduced RV fxn, sev elev PASP. Mod dil LA. Mild-mod MR; d. 05/2021 Echo: EF 20-25%, glob HK, sev red RV fxn, sev BAE, sev MR/TR/TS, triv AI.   Diabetes mellitus without complication (HCC)    Essential hypertension    Hypertensive heart disease    Left inguinal hernia 05/2020  Mixed Ischemic and Non-ischemic Cardiomyopathy    a. 02/2015 EF 20-25%, diff HK; b. 09/2016 EF 40-45%; c. 09/2019 EF 25-30%; d. 05/2021 Echo: EF 20-25%, glob HK.   Persistent atrial fibrillation (HCC)    a. Dx 05/2021-->CHA2DS2VASc = 5-->eliquis.   Pulmonary hypertension (HCC)    Severe mitral regurgitation    a. 09/2017 Echo: Mild to mod MR; b. 09/2019 Echo: mild-mod MR; c. 05/2021 Echo: Sev MR/TR/TS.   Tubular adenoma of colon    Social History   Socioeconomic History   Marital status: Married    Spouse name: Carney Bern   Number of children: 4   Years of education: Not on file   Highest education level: Not on file  Occupational History   Occupation: retired  Tobacco Use   Smoking status: Never   Smokeless tobacco: Never  Vaping Use   Vaping status: Never Used  Substance and Sexual Activity   Alcohol use: Not Currently    Comment: none in many years   Drug use: No   Sexual activity: Yes  Other Topics Concern   Not  on file  Social History Narrative   Not on file   Social Drivers of Health   Financial Resource Strain: Low Risk  (10/17/2022)   Overall Financial Resource Strain (CARDIA)    Difficulty of Paying Living Expenses: Not hard at all  Food Insecurity: No Food Insecurity (05/04/2023)   Hunger Vital Sign    Worried About Running Out of Food in the Last Year: Never true    Ran Out of Food in the Last Year: Never true  Transportation Needs: No Transportation Needs (05/04/2023)   PRAPARE - Administrator, Civil Service (Medical): No    Lack of Transportation (Non-Medical): No  Physical Activity: Sufficiently Active (10/17/2022)   Exercise Vital Sign    Days of Exercise per Week: 4 days    Minutes of Exercise per Session: 60 min  Stress: No Stress Concern Present (10/17/2022)   [employer] of Occupational Health - Occupational Stress Questionnaire    Feeling of Stress : Not at all  Social Connections: Patient Unable To Answer (05/14/2023)   Social Connection and Isolation Panel [NHANES]    Frequency of Communication with Friends and Family: Patient unable to answer    Frequency of Social Gatherings with Friends and Family: Patient unable to answer    Attends Religious Services: Patient unable to answer    Active Member of Clubs or Organizations: Patient unable to answer    Attends Banker Meetings: Patient unable to answer    Marital Status: Patient unable to answer  Recent Concern: Social Connections - Moderately Isolated (05/04/2023)   Social Connection and Isolation Panel [NHANES]    Frequency of Communication with Friends and Family: Never    Frequency of Social Gatherings with Friends and Family: Once a week    Attends Religious Services: More than 4 times per year    Active Member of [employer] or Organizations: No    Attends Banker Meetings: Never    Marital Status: Married   Family History  Problem Relation Age of Onset   Cancer Mother    Other  Father        unknown medical history   Scheduled Meds:  [START ON 05/16/2023] amiodarone  100 mg Per Tube Daily   Chlorhexidine Gluconate Cloth  6 each Topical Daily   docusate  100 mg Per Tube BID   [START ON 05/16/2023] famotidine  20  mg Per Tube Daily   [START ON 05/16/2023] feeding supplement (PROSource TF20)  60 mL Per Tube Daily   free water  30 mL Per Tube Q4H   insulin aspart  0-15 Units Subcutaneous Q4H   mupirocin ointment  1 Application Nasal BID   [START ON 05/16/2023] nutrition supplement (JUVEN)  1 packet Per Tube BID BM   mouth rinse  15 mL Mouth Rinse Q2H   polyethylene glycol  17 g Per Tube Daily   [START ON 05/16/2023] thiamine  100 mg Per Tube Daily   Continuous Infusions:  sodium chloride Stopped (05/15/23 1248)    ceFAZolin (ANCEF) IV Stopped (05/15/23 1149)   feeding supplement (VITAL 1.5 CAL) 20 mL/hr at 05/15/23 1600   fentaNYL infusion INTRAVENOUS 50 mcg/hr (05/15/23 1600)   norepinephrine (LEVOPHED) Adult infusion 17 mcg/min (05/15/23 1600)   propofol (DIPRIVAN) infusion 25 mcg/kg/min (05/15/23 1635)   vasopressin Stopped (05/14/23 1936)   PRN Meds:.sodium chloride, fentaNYL (SUBLIMAZE) injection, midazolam, mouth rinse, polyethylene glycol Medications Prior to Admission:  Prior to Admission medications   Medication Sig Start Date End Date Taking? Authorizing Provider  atorvastatin (LIPITOR) 40 MG tablet TAKE 1 TABLET(40 MG) BY MOUTH DAILY 12/21/22  Yes Iran Ouch, MD  carvedilol (COREG) 3.125 MG tablet TAKE 1 TABLET BY MOUTH 2 TIMES DAILY WITH A MEAL 03/25/23  Yes Iran Ouch, MD  cholecalciferol (VITAMIN D3) 25 MCG (1000 UNIT) tablet Take 1,000 Units by mouth daily.   Yes [provider]  FARXIGA 10 MG TABS tablet TAKE 1 TABLET(10 MG) BY MOUTH DAILY BEFORE BREAKFAST 12/26/22  Yes Bensimhon, Bevelyn Buckles, MD  ferrous sulfate 325 (65 FE) MG tablet Take 325 mg by mouth daily with breakfast.   Yes [provider]  gabapentin (NEURONTIN)  100 MG capsule Take 100 mg by mouth 3 (three) times daily. Taking 2 -100 mg pills in am and 1 -100 mg at night 04/12/21 05/14/23 Yes [provider]  loratadine (CLARITIN) 10 MG tablet TAKE 1 TABLET(10 MG) BY MOUTH DAILY 05/25/22  Yes Glori Luis, MD  Magnesium Oxide 400 MG CAPS Take 1 capsule (400 mg total) by mouth daily. 12/12/21  Yes Creig Hines, NP  torsemide (DEMADEX) 20 MG tablet Take 40 mg one day then 20 mg the next day, so continue to alternate 40mg  to 20mg  every other day as per cardio 05/16/23  Yes Charise Killian, MD  amiodarone (PACERONE) 200 MG tablet Take 1 tablet (200 mg total) by mouth 2 (two) times daily. 05/14/23 06/13/23  Charise Killian, MD  Blood Glucose Monitoring Suppl DEVI 1 each by Does not apply route daily. May substitute to any manufacturer covered by patient's insurance. 01/23/23   Glori Luis, MD  ceFAZolin (ANCEF) IVPB Inject 2 g into the vein every 12 (twelve) hours. Indication:  MSSA bacteremia and cervical spine abscess First Dose: Yes Last Day of Therapy:  06/14/2023 Labs - Once weekly:  CBC/D, CMP, ESR and CRP Fax weekly lab results  promptly to [phone]  and [phone] Method of administration: IV Push Method of administration may be changed at the discretion of home infusion pharmacist based upon assessment of the patient and/or caregiver's ability to self-administer the medication ordered. Please pull PIC at completion of IV antibiotics Call [phone] with critical value or questions 05/14/23 06/17/23  Charise Killian, MD  Glucose Blood (BLOOD GLUCOSE TEST STRIPS) STRP 1 each by In Vitro route daily as needed. May substitute to any  manufacturer covered by [employer]. 01/23/23   Glori Luis, MD  HYDROcodone-acetaminophen (NORCO/VICODIN) 5-325 MG tablet Take 1-2 tablets by mouth every 6 (six) hours as needed for up to 1 day for moderate pain (pain score 4-6) or severe pain (pain score 7-10). 05/14/23  05/15/23  Charise Killian, MD  Lancet Device MISC 1 each by Does not apply route daily as needed. May substitute to any manufacturer covered by patient's insurance. 01/23/23   Glori Luis, MD  Lancets Misc. MISC 1 each by Does not apply route daily as needed. May substitute to any manufacturer covered by patient's insurance. 01/23/23   Glori Luis, MD  spironolactone (ALDACTONE) 25 MG tablet Take 0.5 tablets (12.5 mg total) by mouth daily. Patient not taking: Reported on 05/14/2023 05/15/23 06/14/23  Charise Killian, MD  warfarin (COUMADIN) 5 MG tablet TAKE 1/2 TO 1 TABLET BY MOUTH EVERY DAY AS DIRECTED BY COUMADIN CLINIC Patient not taking: Reported on 05/14/2023 12/26/22   Iran Ouch, MD   Allergies  Allergen Reactions   Shellfish Allergy Other (See Comments) and Hives    Pt. instructed by MD to avoid seafood   Review of Systems  Unable to perform ROS   Physical Exam Constitutional:      Comments: Eyes closed  Pulmonary:     Comments: On ventilator    Vital Signs: BP 107/61   Pulse (!) 30   Temp 98.4 F (36.9 C)   Resp 20   Ht 5\' 9"  (1.753 m)   Wt 67 kg   SpO2 96%   BMI 21.81 kg/m  Pain Scale: CPOT       SpO2: SpO2: 96 % O2 Device:SpO2: 96 % O2 Flow Rate: .O2 Flow Rate (L/min): 15 L/min  IO: Intake/output summary:  Intake/Output Summary (Last 24 hours) at 05/15/2023 1636 Last data filed at 05/15/2023 1600 Gross per 24 hour  Intake 1297.9 ml  Output 920 ml  Net 377.9 ml    LBM:   Baseline Weight: Weight: 67.5 kg Most recent weight: Weight: 67 kg       Signed by: Morton Stall, NP   Please contact Palliative Medicine Team phone at [phone] for questions and concerns.  For individual provider: See Loretha Stapler

## 2023-05-15 NOTE — Plan of Care (Signed)
 Pt's blood pressure remains unstable requiring increase in pressors this shift.  Pt responds to pain.  Tube feeds initiated this shift.  Wife at bedside updated.    Problem: Respiratory: Goal: Ability to maintain a clear airway and adequate ventilation will improve Outcome: Progressing   Problem: Fluid Volume: Goal: Ability to maintain a balanced intake and output will improve Outcome: Progressing   Problem: Health Behavior/Discharge Planning: Goal: Ability to identify and utilize available resources and services will improve Outcome: Progressing   Problem: Metabolic: Goal: Ability to maintain appropriate glucose levels will improve Outcome: Progressing   Problem: Nutritional: Goal: Maintenance of adequate nutrition will improve Outcome: Progressing Goal: Progress toward achieving an optimal weight will improve Outcome: Progressing   Problem: Skin Integrity: Goal: Risk for impaired skin integrity will decrease Outcome: Progressing   Problem: Clinical Measurements: Goal: Will remain free from infection Outcome: Progressing Goal: Respiratory complications will improve Outcome: Progressing   Problem: Nutrition: Goal: Adequate nutrition will be maintained Outcome: Progressing   Problem: Coping: Goal: Level of anxiety will decrease Outcome: Progressing   Problem: Elimination: Goal: Will not experience complications related to urinary retention Outcome: Progressing   Problem: Pain Managment: Goal: General experience of comfort will improve and/or be controlled Outcome: Progressing   Problem: Safety: Goal: Ability to remain free from injury will improve Outcome: Progressing   Problem: Skin Integrity: Goal: Risk for impaired skin integrity will decrease Outcome: Progressing   Problem: Activity: Goal: Ability to tolerate increased activity will improve Outcome: Not Progressing   Problem: Role Relationship: Goal: Method of communication will improve Outcome: Not  Progressing   Problem: Education: Goal: Ability to describe self-care measures that may prevent or decrease complications (Diabetes Survival Skills Education) will improve Outcome: Not Progressing   Problem: Coping: Goal: Ability to adjust to condition or change in health will improve Outcome: Not Progressing   Problem: Health Behavior/Discharge Planning: Goal: Ability to manage health-related needs will improve Outcome: Not Progressing   Problem: Tissue Perfusion: Goal: Adequacy of tissue perfusion will improve Outcome: Not Progressing   Problem: Education: Goal: Knowledge of General Education information will improve Description: Including pain rating scale, medication(s)/side effects and non-pharmacologic comfort measures Outcome: Not Progressing   Problem: Clinical Measurements: Goal: Ability to maintain clinical measurements within normal limits will improve Outcome: Not Progressing Goal: Diagnostic test results will improve Outcome: Not Progressing Goal: Cardiovascular complication will be avoided Outcome: Not Progressing   Problem: Activity: Goal: Risk for activity intolerance will decrease Outcome: Not Progressing   Problem: Elimination: Goal: Will not experience complications related to bowel motility Outcome: Not Progressing

## 2023-05-15 NOTE — Progress Notes (Signed)
 PHARMACY - ANTICOAGULATION CONSULT NOTE  Pharmacy Consult for Heparin  Indication: atrial fibrillation - transitioning from warfarin   Allergies  Allergen Reactions   Shellfish Allergy Other (See Comments) and Hives    Pt. instructed by MD to avoid seafood    Patient Measurements: Height: 5\' 9"  (175.3 cm) Weight: 67 kg (147 lb 11.3 oz) IBW/kg (Calculated) : 70.7  Vital Signs: Temp: 100.2 F (37.9 C) (04/09 2100) Temp Source: Bladder (04/09 1930) BP: 97/44 (04/09 2100) Pulse Rate: 63 (04/09 2100)  Labs: Recent Labs    05/13/23 0510 05/14/23 0421 05/14/23 1823 05/14/23 2021 05/15/23 0027 05/15/23 0342 05/15/23 0819 05/15/23 1519 05/15/23 2117  HGB 13.1  --  12.0*  --   --  12.4*  --   --   --   HCT 39.1  --  38.0*  --   --  37.5*  --   --   --   PLT 177  --  198  --   --  216  --   --   --   LABPROT 46.4*   < >  --    < >  --  25.5*  --  26.9* 29.5*  INR 5.0*   < >  --    < >  --  2.3*  --  2.5* 2.8*  CREATININE 2.33*   < > 2.81*  --   --  2.55*  --  2.57*  --   TROPONINIHS  --   --  52*   < > 605* 790* 672*  --   --    < > = values in this interval not displayed.    Estimated Creatinine Clearance: 23.2 mL/min (A) (by C-G formula based on SCr of 2.57 mg/dL (H)).   Medical History: Past Medical History:  Diagnosis Date   Adenocarcinoma of colon (HCC) 06/19/2013   a. moderately differentiated stage IIIc (T4bN2a M0) adenocarcinoma of colon  s/p colectomy followed by chemoRx with oxaliplatin & fluorouacil.   Anemia    CAD (coronary artery disease)    a. 02/2015 Cath: LM nl, LAD 50/40 mid/distal, LCX 80p, RCA 40p, 30d, RPL1 40, CO 3.27, CI 1.65; b. 06/09/15 PCI: LM minor irregs, m-dLAD 50%, dLAD 40%, pLCx 80% (s/p PCI/DES 0%), pRCA 40%, dRCA 30%, 1st RPLB 40%   Chronic kidney disease, stage 3a (HCC)    Chronic systolic CHF (congestive heart failure) (HCC)    a. 02/2015 Echo: EF 20-25%, diff HK, mod MR, mildy dil LA, mildly dil RV w/ mod RV syst dysfxn, mildly dil RA,  mod TR, PASP ; b. 09/2017 Echo: EF 40-45%, diff HK. Gr1 DD. Mild to mod MR. Mildly to mod dil LA; c. 09/2019 Echo: EF 25-30%, glob HK, mod reduced RV fxn, sev elev PASP. Mod dil LA. Mild-mod MR; d. 05/2021 Echo: EF 20-25%, glob HK, sev red RV fxn, sev BAE, sev MR/TR/TS, triv AI.   Diabetes mellitus without complication (HCC)    Essential hypertension    Hypertensive heart disease    Left inguinal hernia 05/2020   Mixed Ischemic and Non-ischemic Cardiomyopathy    a. 02/2015 EF 20-25%, diff HK; b. 09/2016 EF 40-45%; c. 09/2019 EF 25-30%; d. 05/2021 Echo: EF 20-25%, glob HK.   Persistent atrial fibrillation (HCC)    a. Dx 05/2021-->CHA2DS2VASc = 5-->eliquis.   Pulmonary hypertension (HCC)    Severe mitral regurgitation    a. 09/2017 Echo: Mild to mod MR; b. 09/2019 Echo: mild-mod MR; c. 05/2021 Echo: Sev MR/TR/TS.  Tubular adenoma of colon     Medications:  Medications Prior to Admission  Medication Sig Dispense Refill Last Dose/Taking   atorvastatin (LIPITOR) 40 MG tablet TAKE 1 TABLET(40 MG) BY MOUTH DAILY 90 tablet 3 05/14/2023   carvedilol (COREG) 3.125 MG tablet TAKE 1 TABLET BY MOUTH 2 TIMES DAILY WITH A MEAL 180 tablet 2 05/14/2023   cholecalciferol (VITAMIN D3) 25 MCG (1000 UNIT) tablet Take 1,000 Units by mouth daily.   05/14/2023   FARXIGA 10 MG TABS tablet TAKE 1 TABLET(10 MG) BY MOUTH DAILY BEFORE BREAKFAST 30 tablet 6 05/14/2023   ferrous sulfate 325 (65 FE) MG tablet Take 325 mg by mouth daily with breakfast.   05/14/2023   gabapentin (NEURONTIN) 100 MG capsule Take 100 mg by mouth 3 (three) times daily. Taking 2 -100 mg pills in am and 1 -100 mg at night   05/14/2023   loratadine (CLARITIN) 10 MG tablet TAKE 1 TABLET(10 MG) BY MOUTH DAILY 30 tablet 11 05/14/2023   Magnesium Oxide 400 MG CAPS Take 1 capsule (400 mg total) by mouth daily. 90 capsule 3 05/14/2023   [START ON 05/16/2023] torsemide (DEMADEX) 20 MG tablet Take 40 mg one day then 20 mg the next day, so continue to alternate 40mg  to 20mg   every other day as per cardio   05/14/2023   amiodarone (PACERONE) 200 MG tablet Take 1 tablet (200 mg total) by mouth 2 (two) times daily. 60 tablet 0    Blood Glucose Monitoring Suppl DEVI 1 each by Does not apply route daily. May substitute to any manufacturer covered by patient's insurance. 1 each 0    ceFAZolin (ANCEF) IVPB Inject 2 g into the vein every 12 (twelve) hours. Indication:  MSSA bacteremia and cervical spine abscess First Dose: Yes Last Day of Therapy:  06/14/2023 Labs - Once weekly:  CBC/D, CMP, ESR and CRP Fax weekly lab results  promptly to [phone]  and [phone] Method of administration: IV Push Method of administration may be changed at the discretion of home infusion pharmacist based upon assessment of the patient and/or caregiver's ability to self-administer the medication ordered. Please pull PIC at completion of IV antibiotics Call [phone] with critical value or questions 68 Units 0    Glucose Blood (BLOOD GLUCOSE TEST STRIPS) STRP 1 each by In Vitro route daily as needed. May substitute to any manufacturer covered by patient's insurance. 100 strip 0    HYDROcodone-acetaminophen (NORCO/VICODIN) 5-325 MG tablet Take 1-2 tablets by mouth every 6 (six) hours as needed for up to 1 day for moderate pain (pain score 4-6) or severe pain (pain score 7-10). 8 tablet 0    Lancet Device MISC 1 each by Does not apply route daily as needed. May substitute to any manufacturer covered by patient's insurance. 1 each 0    Lancets Misc. MISC 1 each by Does not apply route daily as needed. May substitute to any manufacturer covered by patient's insurance. 100 each 0    spironolactone (ALDACTONE) 25 MG tablet Take 0.5 tablets (12.5 mg total) by mouth daily. (Patient not taking: Reported on 05/14/2023) 15 tablet 0 Not Taking   warfarin (COUMADIN) 5 MG tablet TAKE 1/2 TO 1 TABLET BY MOUTH EVERY DAY AS DIRECTED BY COUMADIN CLINIC (Patient not taking: Reported on 05/14/2023) 70 tablet 1  Not Taking    Assessment: Pharmacy consulted to dose heparin for Afib in this 78 yo on warfarin PTA.     4/8:  INR @ 2021 =  2.3, therapeutic  4/9:  INR @ 0342 = 2.3, therapeutic  4/9:  INR @ 1519 = 2.5, therapeutic 4/9:  INR @ 2117 = 2.8, therapeutic   CrCl = 21.4 ml/min  Goal of Therapy:  Heparin level 0.3-0.7 units/ml   Plan:  - Will start heparin (950 units/hr + 4000 units bolus) once INR < 2 4/8:  INR @ 2021 = 2.3, therapeutic 4/9:  INR @ 0342 = 2.3, therapeutic  4/9:  INR @ 1519 = 2.5, therapeutic 4/9:  INR @ 2117 = 2.8, therapeutic  - check INR again on 4/10 @ 0500 - CBC daily   [name] D 05/15/2023,9:53 PM

## 2023-05-15 NOTE — TOC Initial Note (Signed)
 Transition of Care (TOC) - Initial/Assessment Note    Patient Details  Name: [name]. MRN: [id number] Date of Birth: December 16, 1945  Transition of Care [facility]) CM/SW Contact:    Alesia Richards, RN Phone Number: 05/15/2023, 1:19 PM  Clinical Narrative:                  Patient re-admitted due to cardiac arrest. Patient intubated, on vent. Patient from [facility]. CM call to Admissions, [facility]. CM spoke to [place]. Per Almira Coaster, patient is anticipated to return to SNF when medically ready. CM will continue to follow for discharge care planning needs.   Patient Goals and CMS Choice    Intubated, on vent     Expected Discharge Plan and Services    SNF, return to prior facility     Prior Living Arrangements/Services    SNF   Activities of Daily Living   ADL Screening (condition at time of admission) Independently performs ADLs?: No Does the patient have a NEW difficulty with bathing/dressing/toileting/self-feeding that is expected to last >3 days?: Yes (Initiates electronic notice to provider for possible OT consult) Does the patient have a NEW difficulty with getting in/out of bed, walking, or climbing stairs that is expected to last >3 days?: Yes (Initiates electronic notice to provider for possible PT consult) Does the patient have a NEW difficulty with communication that is expected to last >3 days?: Yes (Initiates electronic notice to provider for possible SLP consult) Is the patient deaf or have difficulty hearing?: Yes Does the patient have difficulty seeing, even when wearing glasses/contacts?: No Does the patient have difficulty concentrating, remembering, or making decisions?: Yes  Permission Sought/Granted    Intubated, on vent    Emotional Assessment      Unable to assess    Admission diagnosis:  Cardiac arrest [facility]) [I46.9] Patient Active Problem List   Diagnosis Date Noted   Cardiac arrest (HCC) 05/14/2023   Persistent atrial  fibrillation (HCC) 05/11/2023   NICM (nonischemic cardiomyopathy) (HCC) 05/11/2023   Spinal epidural abscess 05/07/2023   MSSA bacteremia 05/06/2023   Non-traumatic rhabdomyolysis 05/06/2023   Severe sepsis (HCC) 05/03/2023   Traumatic rhabdomyolysis (HCC) 05/03/2023   Accidental fall from bed, initial encounter 05/03/2023   Supratherapeutic INR 05/03/2023   Elevated troponin 05/03/2023   Abnormal LFTs--possible biliary tract disease 05/03/2023   Pneumonia, possible 05/03/2023   Syncope, presumed 05/03/2023   Acute ST elevation myocardial infarction (STEMI) of inferior wall (HCC) 05/03/2023   Abnormal MRI, cervical spine 05/03/2023   Endocarditis 05/03/2023   Right shoulder pain 10/23/2022   Hyperkalemia 09/18/2022   Allergic rhinitis 05/25/2022   Atherosclerosis of native arteries of extremity with intermittent claudication (HCC) 03/21/2022   Long term (current) use of anticoagulants 10/25/2021   Rash in adult 10/15/2021   Atrial fibrillation with rapid ventricular response (HCC) 05/29/2021   Hydrocele, left 11/01/2020   Status post laparoscopic hernia repair 09/27/2020   Anemia in chronic kidney disease 04/08/2019   Stage 3a chronic kidney disease (HCC) 04/08/2019   Hyperlipidemia 04/28/2018   Blistering of skin 01/23/2018   Leg wound, left, initial encounter 01/02/2018   Absent pedal pulses 01/02/2018   Onychomycosis 01/02/2018   Leukopenia 09/28/2017   Thrombocytopenia (HCC) 09/28/2017   B12 deficiency 09/28/2017   Renal mass 09/10/2017   Diabetes (HCC) 09/10/2017   Leg wound, right, initial encounter 09/10/2017   Acute renal failure superimposed on stage 3b chronic kidney disease (HCC) 08/14/2017   Anemia 09/12/2015  CAD S/P percutaneous coronary angioplasty    Hypertensive heart disease    CAD (coronary artery disease)    Mixed Ischemic and Non-ischemic Cardiomyopathy    Chronic HFrEF (heart failure with reduced ejection fraction) (HCC)    Bilateral lower extremity  edema 02/10/2015   Abdominal pain 02/10/2015   Cough 02/10/2015   Neuropathy (HCC) 09/16/2014   Tubular adenoma of colon 07/20/2014   History of colon cancer 07/20/2014   Benign colon polyp 07/20/2014   Hypertension 07/20/2014   Hyperplastic colon polyp 07/20/2014   PCP:  Glori Luis, MD (Inactive) Pharmacy:   [facility] [phone] [place], [place] - [address] AT [facility] OF [address] [place] [address] [place] [address] Phone: [phone] Fax: [phone]     Social Drivers of Health (SDOH) Social History: SDOH Screenings   Food Insecurity: No Food Insecurity (05/04/2023)  Housing: Low Risk  (05/04/2023)  Transportation Needs: No Transportation Needs (05/04/2023)  Utilities: Not At Risk (05/04/2023)  Alcohol Screen: Low Risk  (10/17/2022)  Depression (PHQ2-9): Low Risk  (01/23/2023)  Financial Resource Strain: Low Risk  (10/17/2022)  Physical Activity: Sufficiently Active (10/17/2022)  Social Connections: Patient Unable To Answer (05/14/2023)  Recent Concern: Social Connections - Moderately Isolated (05/04/2023)  Stress: No Stress Concern Present (10/17/2022)  Tobacco Use: Low Risk  (05/14/2023)  Health Literacy: Inadequate Health Literacy (10/17/2022)   SDOH Interventions:     Readmission Risk Interventions    05/09/2023   12:59 PM  Readmission Risk Prevention Plan  PCP or Specialist Appt within 3-5 Days Complete  Social Work Consult for Recovery Care Planning/Counseling Complete  Palliative Care Screening Not Applicable

## 2023-05-15 NOTE — Consult Note (Addendum)
 WOC Nurse Consult Note: patient followed at [facility] for diabetic ulcer L anterior foot/lower leg; last visit 04/30/2023 with collagen dressing being used; collagen dressings not on formulary at [facility], silver alginate is the substitute  Reason for Consult:L foot wound  Wound type: full thickness diabetic  Pressure Injury POA: NA  Measurement: see nursing flowsheet; per [facility] 3/25 1 cm x 0.7 cm x 0.2 cm  Wound bed:dry necrotic tissue  Drainage (amount, consistency, odor) appear dry  Periwound:appearance of dry dark skin  around wound bed  Dressing procedure/placement/frequency: Cleanse L anterior foot/lower leg wound with NS, apply silver hydrofiber [place] [phone]) to wound bed every other day, cover with silicone foam.  May lift foam to replace silver.  Change foam q3 days and prn soiling.   Patient should resume collagen dressing when discharged and continue to follow with wound care center for ongoing management of this wound.    POC discussed with bedside nurse. WOC team will not follow. Re-consult if further needs arise.   Thank you,    Priscella Mann MSN, RN-BC, [employer] [phone]

## 2023-05-15 NOTE — Plan of Care (Signed)
 Patient admitted to AR-ICU overnight s/p cardiac arrest. The patient is responsive to voice but does not follow commands. The patient moves all 4 extremities with greater weakness appreciated in the right upper extremity. The patient remains intubated and is still requiring vasopressor support. TTM was initiated at 2145 hours on 05/14/23 and was terminated per VO of the CCM provider at 2305 hours on 05/14/23 as the patient exhibited significant improvement in his neurological exam.    Problem: Activity: Goal: Ability to tolerate increased activity will improve Outcome: Progressing   Problem: Respiratory: Goal: Ability to maintain a clear airway and adequate ventilation will improve Outcome: Progressing   Problem: Role Relationship: Goal: Method of communication will improve Outcome: Progressing   Problem: Education: Goal: Ability to describe self-care measures that may prevent or decrease complications (Diabetes Survival Skills Education) will improve Outcome: Progressing Goal: Individualized Educational Video(s) Outcome: Progressing   Problem: Coping: Goal: Ability to adjust to condition or change in health will improve Outcome: Progressing   Problem: Fluid Volume: Goal: Ability to maintain a balanced intake and output will improve Outcome: Progressing   Problem: Health Behavior/Discharge Planning: Goal: Ability to identify and utilize available resources and services will improve Outcome: Progressing Goal: Ability to manage health-related needs will improve Outcome: Progressing   Problem: Metabolic: Goal: Ability to maintain appropriate glucose levels will improve Outcome: Progressing   Problem: Nutritional: Goal: Maintenance of adequate nutrition will improve Outcome: Progressing Goal: Progress toward achieving an optimal weight will improve Outcome: Progressing   Problem: Skin Integrity: Goal: Risk for impaired skin integrity will decrease Outcome: Progressing    Problem: Tissue Perfusion: Goal: Adequacy of tissue perfusion will improve Outcome: Progressing   Problem: Education: Goal: Knowledge of General Education information will improve Description: Including pain rating scale, medication(s)/side effects and non-pharmacologic comfort measures Outcome: Progressing   Problem: Health Behavior/Discharge Planning: Goal: Ability to manage health-related needs will improve Outcome: Progressing   Problem: Clinical Measurements: Goal: Ability to maintain clinical measurements within normal limits will improve Outcome: Progressing Goal: Will remain free from infection Outcome: Progressing Goal: Diagnostic test results will improve Outcome: Progressing Goal: Respiratory complications will improve Outcome: Progressing Goal: Cardiovascular complication will be avoided Outcome: Progressing   Problem: Activity: Goal: Risk for activity intolerance will decrease Outcome: Progressing   Problem: Nutrition: Goal: Adequate nutrition will be maintained Outcome: Progressing   Problem: Coping: Goal: Level of anxiety will decrease Outcome: Progressing   Problem: Elimination: Goal: Will not experience complications related to bowel motility Outcome: Progressing Goal: Will not experience complications related to urinary retention Outcome: Progressing   Problem: Pain Managment: Goal: General experience of comfort will improve and/or be controlled Outcome: Progressing   Problem: Safety: Goal: Ability to remain free from injury will improve Outcome: Progressing   Problem: Skin Integrity: Goal: Risk for impaired skin integrity will decrease Outcome: Progressing

## 2023-05-16 ENCOUNTER — Other Ambulatory Visit: Payer: Self-pay

## 2023-05-16 DIAGNOSIS — J9601 Acute respiratory failure with hypoxia: Secondary | ICD-10-CM

## 2023-05-16 DIAGNOSIS — Z7189 Other specified counseling: Secondary | ICD-10-CM | POA: Diagnosis not present

## 2023-05-16 DIAGNOSIS — I5021 Acute systolic (congestive) heart failure: Secondary | ICD-10-CM | POA: Diagnosis not present

## 2023-05-16 DIAGNOSIS — J9602 Acute respiratory failure with hypercapnia: Secondary | ICD-10-CM

## 2023-05-16 DIAGNOSIS — I469 Cardiac arrest, cause unspecified: Secondary | ICD-10-CM | POA: Diagnosis not present

## 2023-05-16 DIAGNOSIS — G9341 Metabolic encephalopathy: Secondary | ICD-10-CM

## 2023-05-16 LAB — BASIC METABOLIC PANEL WITH GFR
Anion gap: 9 (ref 5–15)
BUN: 55 mg/dL — ABNORMAL HIGH (ref 8–23)
CO2: 30 mmol/L (ref 22–32)
Calcium: 8.9 mg/dL (ref 8.9–10.3)
Chloride: 96 mmol/L — ABNORMAL LOW (ref 98–111)
Creatinine, Ser: 2.6 mg/dL — ABNORMAL HIGH (ref 0.61–1.24)
GFR, Estimated: 25 mL/min — ABNORMAL LOW (ref >60)
Glucose, Bld: 130 mg/dL — ABNORMAL HIGH (ref 70–99)
Potassium: 4.6 mmol/L (ref 3.5–5.1)
Sodium: 135 mmol/L (ref 135–145)

## 2023-05-16 LAB — MAGNESIUM: Magnesium: 2.2 mg/dL (ref 1.7–2.4)

## 2023-05-16 LAB — COOXEMETRY PANEL
Carboxyhemoglobin: 1.8 % — ABNORMAL HIGH (ref 0.5–1.5)
Methemoglobin: 0.7 % (ref 0.0–1.5)
O2 Saturation: 65.4 %
Total hemoglobin: 12.5 g/dL (ref 12.0–16.0)
Total oxygen content: 63.8 %

## 2023-05-16 LAB — CBC
HCT: 35.9 % — ABNORMAL LOW (ref 39.0–52.0)
Hemoglobin: 11.8 g/dL — ABNORMAL LOW (ref 13.0–17.0)
MCH: 32.9 pg (ref 26.0–34.0)
MCHC: 32.9 g/dL (ref 30.0–36.0)
MCV: 100 fL (ref 80.0–100.0)
Platelets: 196 10*3/uL (ref 150–400)
RBC: 3.59 MIL/uL — ABNORMAL LOW (ref 4.22–5.81)
RDW: 15.9 % — ABNORMAL HIGH (ref 11.5–15.5)
WBC: 10.3 10*3/uL (ref 4.0–10.5)
nRBC: 0 % (ref 0.0–0.2)

## 2023-05-16 LAB — GLUCOSE, CAPILLARY
Glucose-Capillary: 120 mg/dL — ABNORMAL HIGH (ref 70–99)
Glucose-Capillary: 135 mg/dL — ABNORMAL HIGH (ref 70–99)
Glucose-Capillary: 159 mg/dL — ABNORMAL HIGH (ref 70–99)
Glucose-Capillary: 163 mg/dL — ABNORMAL HIGH (ref 70–99)
Glucose-Capillary: 172 mg/dL — ABNORMAL HIGH (ref 70–99)
Glucose-Capillary: 174 mg/dL — ABNORMAL HIGH (ref 70–99)

## 2023-05-16 LAB — PROTIME-INR
INR: 2.7 — ABNORMAL HIGH (ref 0.8–1.2)
Prothrombin Time: 28.8 s — ABNORMAL HIGH (ref 11.4–15.2)

## 2023-05-16 LAB — PHOSPHORUS: Phosphorus: 3.4 mg/dL (ref 2.5–4.6)

## 2023-05-16 MED ORDER — FUROSEMIDE 10 MG/ML IJ SOLN
80.0000 mg | Freq: Once | INTRAMUSCULAR | Status: AC
  Administered 2023-05-16: 80 mg via INTRAVENOUS
  Filled 2023-05-16: qty 8

## 2023-05-16 NOTE — Progress Notes (Signed)
 NAME:  Anton Cheramie., MRN:  [id number], DOB:  11-Apr-1945, LOS: 2 ADMISSION DATE:  05/14/2023, CONSULTATION DATE:  05/14/23 REFERRING MD:  Dr. Marisa Severin, CHIEF COMPLAINT:  cardiac arrest   History of Present Illness:  78 yo M presenting to [facility] ED on 05/14/23 for evaluation after cardiac arrest.  History obtained per chart review as patient unable to participate in interview at this time. Patient was being taken down in the elevator after being discharged from the hospital when he became unresponsive and agonal, no palpable pulses in V-fib arrest. Patient defibrillated with 200 J with subsequent asystole, ACLS continued with ROSC after 5 minutes. Patient transported to ED.  Of note patient had been hospitalized from 05/03/23 - 05/14/23 with cardiology, ID & neuro-surgery consulted. Patient had arrived from home after being found by his wife after an un-witnessed fall and was unable to get up which is not his baseline. He was admitted by [facility] for treatment of Severe Sepsis suspect secondary to cervical abscess & MSSA bacteremia from LEFT wound, AKI on CKD stage 3a, Traumatic Rhabdomyolysis, Transaminitis, possible CAP, Acute on Chronic HFrEF, Supra therapeutic INR and thrombocytopenia. He was placed in a C-collar, not a candidate for surgical intervention. Started on Antibiotics, cardioverted into SR and ultimately considered stable for discharge as labs and condition had improved. TEE negative for endocarditis.  ED course: Patient arrived post ROSC, then PEA arrested for less than 5 minutes requiring 1 round of ACLS prior to ROSC. He was emergently intubated requiring mechanical ventilatory support. Patient in severe circulatory shock. Labs significant for continue hypochloremia, hyperglycemia, slightly worse AKI on CKD, slightly elevated Alk Phos, hypoalbuminemia, continued elevation in T. Bili, elevated BNP, mildly elevated troponin, lactic acidosis without leukocytosis, elevated MCV and elevated but much  improved INR.   Medications given: etomidate & succinylcholine, levophed, versed & fentanyl drips started. ACLS medications Initial Vitals: 99, 20, 103, 54/34 & 97% on BVM Significant labs: (Labs/ Imaging personally reviewed) I, Cheryll Cockayne Rust-Chester, AGACNP-BC, personally viewed and interpreted this ECG. EKG Interpretation: Date: 05/14/23, EKG Time: 17 : 44, Rate: 95, Rhythm: NSR, QRS Axis:  LAD, Intervals: normal, ST/T Wave abnormalities: non specific T wave abnormalities, Narrative Interpretation: NSR Chemistry: Na+: 135, K+: 3.8, Cl: 95, glucose: 218, BUN/Cr.: 58/ 2.81, Serum CO2/ AG: 26/ 14, Alk Phos: 141, albumin: 2.2, T.Bili: 1.5 Hematology: WBC: 9.2, Hgb: 12.0,  Troponin: 52 > 160, BNP: 2694, Lactic: 4.3, INR: 2.3 ABG: 7.50/ 42/ 285/ 32.8  CXR 05/14/23: cardiomegaly with small left sided effusion and dense left lung consolidation similar to prior x-ray. Improved aeration at right base CT head wo contrast 05/14/23: (Spoke with Dr. Rise Mu regarding CT head: negative for intracranial abnormality)  No acute intracranial abnormality. Moderately advanced chronic microvascular ischemic disease.  PCCM consulted for admission due to cardiac arrest requiring emergent intubation and mechanical ventilatory support with circulatory shock in the setting of known MSSA bacteremia and a suspected cervical abscess.  Pertinent  Medical History  HFrEF 20-25%, (Global hypokinesis, NICM, moderate - severe TR) Pulmonary HTN CKD stage 3a CAD Anemia Adenocarcinoma of colon PAF on warfarin Left inguinal hernia HTN T2DM  Significant Hospital Events: Including procedures, antibiotic start and stop dates in addition to other pertinent events   05/14/23: Re-admit to ICU following cardiac arrest during discharge requiring mechanical ventilatory support with circulatory shock in the setting of known MSSA bacteremia and a suspected cervical abscess. 4/10 severe end stage CHF, cardiogenic  shock  Interim History / Subjective:  Remains critically ill Remains intubated Requires VENT support for survival ENd stage cardiac failure Cardiogenic shock   Objective   Blood pressure 116/66, pulse 84, temperature (!) 100.9 F (38.3 C), resp. rate 20, height 5\' 9"  (1.753 m), weight 67 kg, SpO2 97%. CVP:  [0 mmHg-15 mmHg] 11 mmHg  Vent Mode: PRVC FiO2 (%):  [35 %-50 %] 35 % Set Rate:  [20 bmp] 20 bmp Vt Set:  [450 mL] 450 mL PEEP:  [5 cmH20] 5 cmH20 Plateau Pressure:  [16 cmH20-18 cmH20] 16 cmH20   Intake/Output Summary (Last 24 hours) at 05/16/2023 0710 Last data filed at 05/16/2023 0454 Gross per 24 hour  Intake 1258.72 ml  Output 635 ml  Net 623.72 ml   Filed Weights   05/14/23 2130 05/15/23 0415 05/16/23 0500  Weight: 67.5 kg 67 kg 67 kg     REVIEW OF SYSTEMS  PATIENT IS UNABLE TO PROVIDE COMPLETE REVIEW OF SYSTEMS DUE TO SEVERE CRITICAL ILLNESS   PHYSICAL EXAMINATION:  GENERAL:critically ill appearing EYES: Pupils equal, round, reactive to light.  No scleral icterus.  MOUTH: Moist mucosal membrane. INTUBATED NECK: Supple.  PULMONARY: Lungs clear to auscultation, +rhonchi, +wheezing CARDIOVASCULAR: S1 and S2.  Regular rate and rhythm GASTROINTESTINAL: Soft, nontender, -distended. Positive bowel sounds.  MUSCULOSKELETAL: No swelling, clubbing, or edema.  NEUROLOGIC: obtunded,sedated SKIN:normal, warm to touch, Capillary refill delayed  Pulses present bilaterally     Assessment & Plan:  78 yo male with end stage systolic cardiac failure with near fatal arhythmia with v fib arrest  with cardiogenic shock leading to severe hypoxic resp failure  Severe ACUTE Hypoxic and Hypercapnic Respiratory Failure -continue Mechanical Ventilator support -Wean Fio2 and PEEP as tolerated -VAP/VENT bundle implementation - Wean PEEP & FiO2 as tolerated, maintain SpO2 > 88% - Head of bed elevated 30 degrees, VAP protocol in place - Plateau pressures less than 30 cm H20   - Intermittent chest x-ray & ABG PRN - Ensure adequate pulmonary hygiene  -will perform SAT/SBT when respiratory parameters are met  ACUTE SYSTOLIC CARDIAC FAILURE- EF 20% -follow up cardiology recs PAF/V fib - Acute on Chronic HFrEF PMHx: HFrEF, NICM, RV dysfunction, mild-mod MR, mod-severe TR, global hypokinesis, HLD, HTN, CAD - Continuous cardiac monitoring - Diurese with the use of IV lasix  as renal function and hemodynamics allow, appreciate Heart failure team recommendations  - candidate for Normothermia Protocol  - Continue vasopressors: levophed & vasopressin PRN, to maintain MAP > 65 - Trend troponin, lactic has normalized, obtain co-ox panel - Cardiology consulted, appreciate input - Hold outpatient regimen: carvedilol, torsemide, spironolactone - amiodarone dosing  severity of shock/qt intgerval - if CT head clear will start heparin drip in lieu of warfarin, consider bridging once stabilized   NEUROLOGY ACUTE METABOLIC ENCEPHALOPATHY -need for sedation -Goal RASS -2 to -3 8-10 minutes downtime, received 1 defibrillations, suspected anoxic injury Plan for WUA with wife at bedside Assess for MRI brain  INFECTIOUS DISEASE -continue antibiotics as prescribed -follow up cultures CAP - F/u cultures, trend PCT - Continue Cefazolin MSSA bacteremia secondary to LEFT foot wound infection Suspected Cervical Abscess - continue Cefazolin - Daily CBC, monitor WBC/ fever curve - Persistent hypotension consider stress dose steroids  - consult WOC - consider consulting ID PRN    Chronic Anticoagulation Recent Supratherapeutic INR - start heparin, consider bridging once stabilized - f/u INR  ACUTE KIDNEY INJURY/Renal Failure -continue Foley Catheter-assess need -Avoid nephrotoxic agents -Follow urine output, BMP -Ensure adequate renal perfusion, optimize oxygenation -Renal dose  medications   Intake/Output Summary (Last 24 hours) at 05/16/2023 0716 Last data filed  at 05/16/2023 0454 Gross per 24 hour  Intake 1258.72 ml  Output 635 ml  Net 623.72 ml      Latest Ref Rng & Units 05/16/2023    4:34 AM 05/15/2023    3:19 PM 05/15/2023    3:42 AM  BMP  Glucose 70 - 99 mg/dL 161  096  045   BUN 8 - 23 mg/dL 55  53  55   Creatinine 0.61 - 1.24 mg/dL 4.09  8.11  9.14   Sodium 135 - 145 mmol/L 135  136  135   Potassium 3.5 - 5.1 mmol/L 4.6  4.5  3.2   Chloride 98 - 111 mmol/L 96  97  97   CO2 22 - 32 mmol/L 30  31  30    Calcium 8.9 - 10.3 mg/dL 8.9  8.6  8.7    ENDO - ICU hypoglycemic\Hyperglycemia protocol -check FSBS per protocol   GI GI PROPHYLAXIS as indicated NUTRITIONAL STATUS DIET-->TF's as tolerated Constipation protocol as indicated   ELECTROLYTES -follow labs as needed -replace as needed -pharmacy consultation and following  RESTRICTIVE TRANSFUSION PROTOCOL TRANSFUSION  IF HGB<7  or ACTIVE BLEEDING OR DX of ACUTE CORONARY SYNDROMES     Best Practice (right click and "Reselect all SmartList Selections" daily)  Diet/type: NPO w/ meds via tube DVT prophylaxis SCD Pressure ulcer(s): N/A GI prophylaxis: H2B Lines: yes and it is still needed Foley:  Yes, and it is still needed Code Status:  full code Last date of multidisciplinary goals of care discussion [05/14/23]  Labs   CBC: Recent Labs  Lab 05/10/23 0622 05/11/23 1000 05/11/23 2200 05/12/23 0640 05/13/23 0510 05/14/23 1823 05/15/23 0342 05/16/23 0434  WBC 6.0 6.4 7.1 6.5 7.6 9.2 11.4* 10.3  NEUTROABS 4.6 5.1 5.6 4.9  --  7.0  --   --   HGB 11.6* 11.7* 12.2* 12.8* 13.1 12.0* 12.4* 11.8*  HCT 35.3* 36.6* 38.2* 39.1 39.1 38.0* 37.5* 35.9*  MCV 99.2 101.1* 101.3* 100.3* 96.5 102.4* 97.7 100.0  PLT 129* 136* 143* 152 177 198 216 196    Basic Metabolic Panel: Recent Labs  Lab 05/14/23 0421 05/14/23 1823 05/15/23 0342 05/15/23 1519 05/16/23 0434  NA 137 135 135 136 135  K 3.5 3.8 3.2* 4.5 4.6  CL 97* 95* 97* 97* 96*  CO2 30 26 30 31 30   GLUCOSE 134* 218*  104* 156* 130*  BUN 54* 58* 55* 53* 55*  CREATININE 2.42* 2.81* 2.55* 2.57* 2.60*  CALCIUM 8.6* 8.3* 8.7* 8.6* 8.9  MG  --   --  2.3  --  2.2  PHOS  --   --  3.2 3.3 3.4   GFR: Estimated Creatinine Clearance: 22.9 mL/min (A) (by C-G formula based on SCr of 2.6 mg/dL (H)). Recent Labs  Lab 05/13/23 0510 05/14/23 1823 05/14/23 2021 05/15/23 0342 05/16/23 0434  WBC 7.6 9.2  --  11.4* 10.3  LATICACIDVEN  --  4.3* 1.4  --   --     Liver Function Tests: Recent Labs  Lab 05/14/23 0421 05/14/23 1823 05/15/23 0342 05/15/23 1519  AST 25 41 31  --   ALT <5 5 <5  --   ALKPHOS 105 141* 125  --   BILITOT 1.5* 1.5* 1.4*  --   PROT 6.5 6.4* 6.8  --   ALBUMIN 2.2* 2.2* 2.3* 2.1*   No results for input(s): "LIPASE", "AMYLASE" in the last  168 hours. No results for input(s): "AMMONIA" in the last 168 hours.  ABG    Component Value Date/Time   PHART 7.5 (H) 05/14/2023 2059   PCO2ART 42 05/14/2023 2059   PO2ART 285 (H) 05/14/2023 2059   HCO3 32.8 (H) 05/14/2023 2059   O2SAT 65.4 05/16/2023 0500     Coagulation Profile: Recent Labs  Lab 05/14/23 2021 05/15/23 0342 05/15/23 1519 05/15/23 2117 05/16/23 0434  INR 2.3* 2.3* 2.5* 2.8* 2.7*    Cardiac Enzymes: No results for input(s): "CKTOTAL", "CKMB", "CKMBINDEX", "TROPONINI" in the last 168 hours.   HbA1C: Hemoglobin A1C  Date/Time Value Ref Range Status  10/23/2022 10:19 AM 5.6 4.0 - 5.6 % Final   Hgb A1c MFr Bld  Date/Time Value Ref Range Status  05/06/2023 04:24 AM 6.9 (H) 4.8 - 5.6 % Final    Comment:    (NOTE) Pre diabetes:          5.7%-6.4%  Diabetes:              >6.4%  Glycemic control for   <7.0% adults with diabetes   05/25/2022 09:41 AM 6.1 4.6 - 6.5 % Final    Comment:    Glycemic Control Guidelines for People with Diabetes:Non Diabetic:  <6%Goal of Therapy: <7%Additional Action Suggested:  >8%     CBG: Recent Labs  Lab 05/15/23 0805 05/15/23 0828 05/15/23 1129 05/15/23 1526 05/16/23 0035   GLUCAP 68* 96 110* 140* 159*    Review of Systems:   UTA- patient intubated and sedated  Past Medical History:  He,  has a past medical history of Adenocarcinoma of colon (HCC) (06/19/2013), Anemia, CAD (coronary artery disease), Chronic kidney disease, stage 3a (HCC), Chronic systolic CHF (congestive heart failure) (HCC), Diabetes mellitus without complication (HCC), Essential hypertension, Hypertensive heart disease, Left inguinal hernia (05/2020), Mixed Ischemic and Non-ischemic Cardiomyopathy, Persistent atrial fibrillation (HCC), Pulmonary hypertension (HCC), Severe mitral regurgitation, and Tubular adenoma of colon.   Surgical History:   Past Surgical History:  Procedure Laterality Date   APPENDECTOMY N/A 06/19/2017   Location: [facility]; Surgeon: Claude Manges, MD   CARDIAC CATHETERIZATION Bilateral 02/24/2015   Procedure: Right/Left Heart Cath and Coronary Angiography;  Surgeon: Iran Ouch, MD;  Location: [facility] INVASIVE CV LAB;  Service: Cardiovascular;  Laterality: Bilateral;   CARDIAC CATHETERIZATION N/A 06/09/2015   Procedure: Coronary Stent Intervention (3.0 x 12 mm Xience Alpine DES to LCx);  Surgeon: Iran Ouch, MD;  Location: [facility] INVASIVE CV LAB;  Service: Cardiovascular;  Laterality: N/A   COLECTOMY  06/19/2017   Descending and proximal sigmoid colectomy, LEFT ureterolysis, partial cecectomy, appendectomy; Location: [facility]; Surgeon: Claude Manges, MD   COLONOSCOPY     COLONOSCOPY WITH PROPOFOL N/A 12/11/2016   Procedure: COLONOSCOPY WITH PROPOFOL;  Surgeon: Christena Deem, MD;  Location: [facility] ENDOSCOPY;  Service: Endoscopy;  Laterality: N/A;   ESOPHAGOGASTRODUODENOSCOPY     HERNIA REPAIR     PORT-A-CATH REMOVAL N/A 07/12/2014   Procedure: REMOVAL PORT-A-CATH;  Surgeon: Duwaine Maxin, MD;  Location: [facility] ORS;  Service: General;  Laterality: N/A;   PORTACATH PLACEMENT Left 2015   TEE WITHOUT CARDIOVERSION N/A 05/07/2023   Procedure: ECHOCARDIOGRAM,  TRANSESOPHAGEAL;  Surgeon: Laurey Morale, MD;  Location: [facility] ORS;  Service: Cardiovascular;  Laterality: N/A;     Social History:   reports that he has never smoked. He has never used smokeless tobacco. He reports that he does not currently use alcohol. He reports that he does not use drugs.  Family History:  His family history includes Cancer in his mother; Other in his father.   Allergies Allergies  Allergen Reactions   Shellfish Allergy Other (See Comments) and Hives    Pt. instructed by MD to avoid seafood     Home Medications  Prior to Admission medications   Medication Sig Start Date End Date Taking? Authorizing Provider  atorvastatin (LIPITOR) 40 MG tablet TAKE 1 TABLET(40 MG) BY MOUTH DAILY 12/21/22  Yes Iran Ouch, MD  carvedilol (COREG) 3.125 MG tablet TAKE 1 TABLET BY MOUTH 2 TIMES DAILY WITH A MEAL 03/25/23  Yes Iran Ouch, MD  cholecalciferol (VITAMIN D3) 25 MCG (1000 UNIT) tablet Take 1,000 Units by mouth daily.   Yes [provider]  FARXIGA 10 MG TABS tablet TAKE 1 TABLET(10 MG) BY MOUTH DAILY BEFORE BREAKFAST 12/26/22  Yes Bensimhon, Bevelyn Buckles, MD  ferrous sulfate 325 (65 FE) MG tablet Take 325 mg by mouth daily with breakfast.   Yes [provider]  gabapentin (NEURONTIN) 100 MG capsule Take 100 mg by mouth 3 (three) times daily. Taking 2 -100 mg pills in am and 1 -100 mg at night 04/12/21 05/14/23 Yes [provider]  loratadine (CLARITIN) 10 MG tablet TAKE 1 TABLET(10 MG) BY MOUTH DAILY 05/25/22  Yes Glori Luis, MD  Magnesium Oxide 400 MG CAPS Take 1 capsule (400 mg total) by mouth daily. 12/12/21  Yes Creig Hines, NP  torsemide (DEMADEX) 20 MG tablet Take 40 mg one day then 20 mg the next day, so continue to alternate 40mg  to 20mg  every other day as per cardio 05/16/23  Yes Charise Killian, MD  amiodarone (PACERONE) 200 MG tablet Take 1 tablet (200 mg total) by mouth 2 (two) times daily. 05/14/23 06/13/23   Charise Killian, MD  Blood Glucose Monitoring Suppl DEVI 1 each by Does not apply route daily. May substitute to any manufacturer covered by patient's insurance. 01/23/23   Glori Luis, MD  ceFAZolin (ANCEF) IVPB Inject 2 g into the vein every 12 (twelve) hours. Indication:  MSSA bacteremia and cervical spine abscess First Dose: Yes Last Day of Therapy:  06/14/2023 Labs - Once weekly:  CBC/D, CMP, ESR and CRP Fax weekly lab results  promptly to [phone]  and [phone] Method of administration: IV Push Method of administration may be changed at the discretion of home infusion pharmacist based upon assessment of the patient and/or caregiver's ability to self-administer the medication ordered. Please pull PIC at completion of IV antibiotics Call [phone] with critical value or questions 05/14/23 06/17/23  Charise Killian, MD  Glucose Blood (BLOOD GLUCOSE TEST STRIPS) STRP 1 each by In Vitro route daily as needed. May substitute to any manufacturer covered by patient's insurance. 01/23/23   Glori Luis, MD  HYDROcodone-acetaminophen (NORCO/VICODIN) 5-325 MG tablet Take 1-2 tablets by mouth every 6 (six) hours as needed for up to 1 day for moderate pain (pain score 4-6) or severe pain (pain score 7-10). 05/14/23 05/15/23  Charise Killian, MD  Lancet Device MISC 1 each by Does not apply route daily as needed. May substitute to any manufacturer covered by patient's insurance. 01/23/23   Glori Luis, MD  Lancets Misc. MISC 1 each by Does not apply route daily as needed. May substitute to any manufacturer covered by patient's insurance. 01/23/23   Glori Luis, MD  spironolactone (ALDACTONE) 25 MG tablet Take 0.5 tablets (12.5 mg total) by mouth daily. Patient  not taking: Reported on 05/14/2023 05/15/23 06/14/23  Charise Killian, MD  warfarin (COUMADIN) 5 MG tablet TAKE 1/2 TO 1 TABLET BY MOUTH EVERY DAY AS DIRECTED BY COUMADIN CLINIC Patient not taking: Reported on  05/14/2023 12/26/22   Iran Ouch, MD     DVT/GI PRX  assessed I Assessed the need for Labs I Assessed the need for Foley I Assessed the need for Central Venous Line Family Discussion when available I Assessed the need for Mobilization I made an Assessment of medications to be adjusted accordingly Safety Risk assessment completed  CASE DISCUSSED IN MULTIDISCIPLINARY ROUNDS WITH ICU TEAM  Patient with end stage CHF, recommend DNR status Recommend comfort care measures Prognosis is grave   Critical Care Time devoted to patient care services described in this note is 65 minutes.  Critical care was necessary to treat /prevent imminent and life-threatening deterioration. Overall, patient is critically ill, prognosis is guarded.  Patient with Multiorgan failure and at high risk for cardiac arrest and death.    Lucie Leather, M.D.  [facility]  Medical Director [facility] Medical Director [facility] Cardio-Pulmonary Department

## 2023-05-16 NOTE — Plan of Care (Deleted)
 Called to Bedside to evaluate for acute LEFT eye PROPTOSIS Patient did NOT have any blindness, was able to see how many fingers I was holing up, extra Ocular muscles intact, no visual disturbances, no pain. No pus.  Saline was used to lubricate eye ball and then with sterile gloves, I pushed the eye ball back into the socket and placed gauze. Patient tolerated really well without any issues.   Lucie Leather, M.D.  [facility]  Medical Director [facility] Medical Director [facility] Cardio-Pulmonary Department

## 2023-05-16 NOTE — Plan of Care (Signed)
  Problem: Respiratory: Goal: Ability to maintain a clear airway and adequate ventilation will improve Outcome: Progressing   Problem: Role Relationship: Goal: Method of communication will improve Outcome: Progressing   Problem: Fluid Volume: Goal: Ability to maintain a balanced intake and output will improve Outcome: Progressing   Problem: Metabolic: Goal: Ability to maintain appropriate glucose levels will improve Outcome: Progressing   Problem: Nutritional: Goal: Maintenance of adequate nutrition will improve Outcome: Progressing Goal: Progress toward achieving an optimal weight will improve Outcome: Progressing   Problem: Skin Integrity: Goal: Risk for impaired skin integrity will decrease Outcome: Progressing   Problem: Clinical Measurements: Goal: Ability to maintain clinical measurements within normal limits will improve Outcome: Progressing Goal: Will remain free from infection Outcome: Progressing Goal: Diagnostic test results will improve Outcome: Progressing Goal: Respiratory complications will improve Outcome: Progressing   Problem: Nutrition: Goal: Adequate nutrition will be maintained Outcome: Progressing   Problem: Coping: Goal: Level of anxiety will decrease Outcome: Progressing   Problem: Elimination: Goal: Will not experience complications related to bowel motility Outcome: Progressing Goal: Will not experience complications related to urinary retention Outcome: Progressing   Problem: Pain Managment: Goal: General experience of comfort will improve and/or be controlled Outcome: Progressing   Problem: Safety: Goal: Ability to remain free from injury will improve Outcome: Progressing   Problem: Activity: Goal: Ability to tolerate increased activity will improve Outcome: Not Progressing   Problem: Education: Goal: Ability to describe self-care measures that may prevent or decrease complications (Diabetes Survival Skills Education) will  improve Outcome: Not Progressing   Problem: Coping: Goal: Ability to adjust to condition or change in health will improve Outcome: Not Progressing   Problem: Tissue Perfusion: Goal: Adequacy of tissue perfusion will improve Outcome: Not Progressing   Problem: Education: Goal: Knowledge of General Education information will improve Description: Including pain rating scale, medication(s)/side effects and non-pharmacologic comfort measures Outcome: Not Progressing

## 2023-05-16 NOTE — Progress Notes (Signed)
 PHARMACY - ANTICOAGULATION CONSULT NOTE  Pharmacy Consult for Heparin  Indication: atrial fibrillation - transitioning from warfarin   Allergies  Allergen Reactions   Shellfish Allergy Other (See Comments) and Hives    Pt. instructed by MD to avoid seafood    Patient Measurements: Height: 5\' 9"  (175.3 cm) Weight: 67 kg (147 lb 11.3 oz) IBW/kg (Calculated) : 70.7  Vital Signs: Temp: 100.8 F (38.2 C) (04/10 0500) Temp Source: Bladder (04/09 1930) BP: 136/46 (04/10 0500) Pulse Rate: 33 (04/10 0500)  Labs: Recent Labs    05/14/23 1823 05/14/23 2021 05/15/23 0027 05/15/23 0342 05/15/23 0819 05/15/23 1519 05/15/23 2117 05/16/23 0434  HGB 12.0*  --   --  12.4*  --   --   --  11.8*  HCT 38.0*  --   --  37.5*  --   --   --  35.9*  PLT 198  --   --  216  --   --   --  196  LABPROT  --    < >  --  25.5*  --  26.9* 29.5* 28.8*  INR  --    < >  --  2.3*  --  2.5* 2.8* 2.7*  CREATININE 2.81*  --   --  2.55*  --  2.57*  --  2.60*  TROPONINIHS 52*   < > 605* 790* 672*  --   --   --    < > = values in this interval not displayed.    Estimated Creatinine Clearance: 22.9 mL/min (A) (by C-G formula based on SCr of 2.6 mg/dL (H)).   Medical History: Past Medical History:  Diagnosis Date   Adenocarcinoma of colon (HCC) 06/19/2013   a. moderately differentiated stage IIIc (T4bN2a M0) adenocarcinoma of colon  s/p colectomy followed by chemoRx with oxaliplatin & fluorouacil.   Anemia    CAD (coronary artery disease)    a. 02/2015 Cath: LM nl, LAD 50/40 mid/distal, LCX 80p, RCA 40p, 30d, RPL1 40, CO 3.27, CI 1.65; b. 06/09/15 PCI: LM minor irregs, m-dLAD 50%, dLAD 40%, pLCx 80% (s/p PCI/DES 0%), pRCA 40%, dRCA 30%, 1st RPLB 40%   Chronic kidney disease, stage 3a (HCC)    Chronic systolic CHF (congestive heart failure) (HCC)    a. 02/2015 Echo: EF 20-25%, diff HK, mod MR, mildy dil LA, mildly dil RV w/ mod RV syst dysfxn, mildly dil RA, mod TR, PASP ; b. 09/2017 Echo: EF 40-45%, diff  HK. Gr1 DD. Mild to mod MR. Mildly to mod dil LA; c. 09/2019 Echo: EF 25-30%, glob HK, mod reduced RV fxn, sev elev PASP. Mod dil LA. Mild-mod MR; d. 05/2021 Echo: EF 20-25%, glob HK, sev red RV fxn, sev BAE, sev MR/TR/TS, triv AI.   Diabetes mellitus without complication (HCC)    Essential hypertension    Hypertensive heart disease    Left inguinal hernia 05/2020   Mixed Ischemic and Non-ischemic Cardiomyopathy    a. 02/2015 EF 20-25%, diff HK; b. 09/2016 EF 40-45%; c. 09/2019 EF 25-30%; d. 05/2021 Echo: EF 20-25%, glob HK.   Persistent atrial fibrillation (HCC)    a. Dx 05/2021-->CHA2DS2VASc = 5-->eliquis.   Pulmonary hypertension (HCC)    Severe mitral regurgitation    a. 09/2017 Echo: Mild to mod MR; b. 09/2019 Echo: mild-mod MR; c. 05/2021 Echo: Sev MR/TR/TS.   Tubular adenoma of colon     Medications:  Medications Prior to Admission  Medication Sig Dispense Refill Last Dose/Taking   atorvastatin (LIPITOR)  40 MG tablet TAKE 1 TABLET(40 MG) BY MOUTH DAILY 90 tablet 3 05/14/2023   carvedilol (COREG) 3.125 MG tablet TAKE 1 TABLET BY MOUTH 2 TIMES DAILY WITH A MEAL 180 tablet 2 05/14/2023   cholecalciferol (VITAMIN D3) 25 MCG (1000 UNIT) tablet Take 1,000 Units by mouth daily.   05/14/2023   FARXIGA 10 MG TABS tablet TAKE 1 TABLET(10 MG) BY MOUTH DAILY BEFORE BREAKFAST 30 tablet 6 05/14/2023   ferrous sulfate 325 (65 FE) MG tablet Take 325 mg by mouth daily with breakfast.   05/14/2023   gabapentin (NEURONTIN) 100 MG capsule Take 100 mg by mouth 3 (three) times daily. Taking 2 -100 mg pills in am and 1 -100 mg at night   05/14/2023   loratadine (CLARITIN) 10 MG tablet TAKE 1 TABLET(10 MG) BY MOUTH DAILY 30 tablet 11 05/14/2023   Magnesium Oxide 400 MG CAPS Take 1 capsule (400 mg total) by mouth daily. 90 capsule 3 05/14/2023   torsemide (DEMADEX) 20 MG tablet Take 40 mg one day then 20 mg the next day, so continue to alternate 40mg  to 20mg  every other day as per cardio   05/14/2023   amiodarone (PACERONE) 200 MG  tablet Take 1 tablet (200 mg total) by mouth 2 (two) times daily. 60 tablet 0    Blood Glucose Monitoring Suppl DEVI 1 each by Does not apply route daily. May substitute to any manufacturer covered by patient's insurance. 1 each 0    ceFAZolin (ANCEF) IVPB Inject 2 g into the vein every 12 (twelve) hours. Indication:  MSSA bacteremia and cervical spine abscess First Dose: Yes Last Day of Therapy:  06/14/2023 Labs - Once weekly:  CBC/D, CMP, ESR and CRP Fax weekly lab results  promptly to [phone]  and [phone] Method of administration: IV Push Method of administration may be changed at the discretion of home infusion pharmacist based upon assessment of the patient and/or caregiver's ability to self-administer the medication ordered. Please pull PIC at completion of IV antibiotics Call [phone] with critical value or questions 68 Units 0    Glucose Blood (BLOOD GLUCOSE TEST STRIPS) STRP 1 each by In Vitro route daily as needed. May substitute to any manufacturer covered by patient's insurance. 100 strip 0    [EXPIRED] HYDROcodone-acetaminophen (NORCO/VICODIN) 5-325 MG tablet Take 1-2 tablets by mouth every 6 (six) hours as needed for up to 1 day for moderate pain (pain score 4-6) or severe pain (pain score 7-10). 8 tablet 0    Lancet Device MISC 1 each by Does not apply route daily as needed. May substitute to any manufacturer covered by patient's insurance. 1 each 0    Lancets Misc. MISC 1 each by Does not apply route daily as needed. May substitute to any manufacturer covered by patient's insurance. 100 each 0    spironolactone (ALDACTONE) 25 MG tablet Take 0.5 tablets (12.5 mg total) by mouth daily. (Patient not taking: Reported on 05/14/2023) 15 tablet 0 Not Taking   warfarin (COUMADIN) 5 MG tablet TAKE 1/2 TO 1 TABLET BY MOUTH EVERY DAY AS DIRECTED BY COUMADIN CLINIC (Patient not taking: Reported on 05/14/2023) 70 tablet 1 Not Taking    Assessment: Pharmacy consulted to dose heparin  for Afib in this 78 yo on warfarin PTA.     4/8:  INR @ 2021 = 2.3, therapeutic  4/9:  INR @ 0342 = 2.3, therapeutic  4/9:  INR @ 1519 = 2.5, therapeutic 4/9:  INR @ 2117 = 2.8,  therapeutic  4/10: INR @ 0434 = 2.7, therapeutic   CrCl = 21.4 ml/min  Goal of Therapy:  Heparin level 0.3-0.7 units/ml   Plan:  - Will start heparin (950 units/hr + 4000 units bolus) once INR < 2 4/8:  INR @ 2021 = 2.3, therapeutic 4/9:  INR @ 0342 = 2.3, therapeutic  4/9:  INR @ 1519 = 2.5, therapeutic 4/9:  INR @ 2117 = 2.8, therapeutic 4/10: INR @ 0434 = 2.7, therapeutic   - check INR again on 4/11 @ 0500 - CBC daily   Mayia Megill D 05/16/2023,5:23 AM

## 2023-05-16 NOTE — Progress Notes (Signed)
 Advanced Heart Failure Rounding Note   Subjective:    Remains on the vent. On NE 11 VP 0.03.   Waking up. Will follow some commands.   Palliative Care following. Speaking with his wife.    Objective:   Weight Range:  Vital Signs:   Temp:  [97.2 F (36.2 C)-100.9 F (38.3 C)] 99 F (37.2 C) (04/10 1100) Pulse Rate:  [28-97] 55 (04/10 1100) Resp:  [18-31] 20 (04/10 1100) BP: (68-143)/(39-77) 115/60 (04/10 1100) SpO2:  [89 %-99 %] 99 % (04/10 1100) FiO2 (%):  [35 %-40 %] 35 % (04/10 1029) Weight:  [67 kg] 67 kg (04/10 0500) Last BM Date : 05/12/23  Weight change: Filed Weights   05/14/23 2130 05/15/23 0415 05/16/23 0500  Weight: 67.5 kg 67 kg 67 kg    Intake/Output:   Intake/Output Summary (Last 24 hours) at 05/16/2023 1231 Last data filed at 05/16/2023 1000 Gross per 24 hour  Intake 1241.42 ml  Output 430 ml  Net 811.42 ml     Physical Exam: General:  On vent. Frail and ill-appearing  HEENT: normal Neck: in neck collar. Cor: Regular rate & rhythm. No rubs, gallops or murmurs. Lungs: clear Abdomen: soft, nontender, nondistended. No hepatosplenomegaly. No bruits or masses. Good bowel sounds. Extremities: no cyanosis, clubbing, rash, tr edema  LE wounds Neuro: Waking up on vent    Telemetry: Sinus 50-60 Personally reviewed  Labs: Basic Metabolic Panel: Recent Labs  Lab 05/14/23 0421 05/14/23 1823 05/15/23 0342 05/15/23 1519 05/16/23 0434  NA 137 135 135 136 135  K 3.5 3.8 3.2* 4.5 4.6  CL 97* 95* 97* 97* 96*  CO2 30 26 30 31 30   GLUCOSE 134* 218* 104* 156* 130*  BUN 54* 58* 55* 53* 55*  CREATININE 2.42* 2.81* 2.55* 2.57* 2.60*  CALCIUM 8.6* 8.3* 8.7* 8.6* 8.9  MG  --   --  2.3  --  2.2  PHOS  --   --  3.2 3.3 3.4    Liver Function Tests: Recent Labs  Lab 05/14/23 0421 05/14/23 1823 05/15/23 0342 05/15/23 1519  AST 25 41 31  --   ALT <5 5 <5  --   ALKPHOS 105 141* 125  --   BILITOT 1.5* 1.5* 1.4*  --   PROT 6.5 6.4* 6.8  --    ALBUMIN 2.2* 2.2* 2.3* 2.1*   No results for input(s): "LIPASE", "AMYLASE" in the last 168 hours. No results for input(s): "AMMONIA" in the last 168 hours.  CBC: Recent Labs  Lab 05/10/23 0622 05/11/23 1000 05/11/23 2200 05/12/23 0640 05/13/23 0510 05/14/23 1823 05/15/23 0342 05/16/23 0434  WBC 6.0 6.4 7.1 6.5 7.6 9.2 11.4* 10.3  NEUTROABS 4.6 5.1 5.6 4.9  --  7.0  --   --   HGB 11.6* 11.7* 12.2* 12.8* 13.1 12.0* 12.4* 11.8*  HCT 35.3* 36.6* 38.2* 39.1 39.1 38.0* 37.5* 35.9*  MCV 99.2 101.1* 101.3* 100.3* 96.5 102.4* 97.7 100.0  PLT 129* 136* 143* 152 177 198 216 196    Cardiac Enzymes: No results for input(s): "CKTOTAL", "CKMB", "CKMBINDEX", "TROPONINI" in the last 168 hours.  BNP: BNP (last 3 results) Recent Labs    12/13/22 1140 04/16/23 1541 05/14/23 1823  BNP 2,083.6* 1,510.6* 2,694.0*    ProBNP (last 3 results) No results for input(s): "PROBNP" in the last 8760 hours.    Other results:  Imaging: CT HEAD WO CONTRAST ( ) Result Date: 05/14/2023 CLINICAL DATA:  Initial evaluation for acute mental status change, cardiac  arrest. EXAM: CT HEAD WITHOUT CONTRAST TECHNIQUE: Contiguous axial images were obtained from the base of the skull through the vertex without intravenous contrast. RADIATION DOSE REDUCTION: This exam was performed according to the departmental dose-optimization program which includes automated exposure control, adjustment of the mA and/or kV according to patient size and/or use of iterative reconstruction technique. COMPARISON:  CT from 05/03/2023. FINDINGS: Brain: Cerebral volume within normal limits. Patchy hypodensity involving the supratentorial cerebral white matter, most consistent with chronic small vessel ischemic disease, moderately advanced in nature. No acute intracranial hemorrhage. No acute large vessel territory infarct. No mass lesion, midline shift or mass effect no hydrocephalus or extra-axial fluid collection. Vascular: No abnormal  hyperdense vessel. Calcified atherosclerosis present at the skull base. Skull: Scalp soft tissues demonstrate no acute finding. Calvarium intact. Sinuses/Orbits: Globes orbital soft tissues within normal limits. Scattered mucosal thickening noted about the paranasal sinuses. No significant mastoid effusion. Patient is intubated. Other: None. IMPRESSION: 1. No acute intracranial abnormality. 2. Moderately advanced chronic microvascular ischemic disease. Electronically Signed   By: Rise Mu M.D.   On: 05/14/2023 23:16   DG Abd Portable 1V Result Date: 05/14/2023 CLINICAL DATA:  Enteric tube placement EXAM: PORTABLE ABDOMEN - 1 VIEW COMPARISON:  CT abdomen and pelvis dated 05/03/2023 FINDINGS: Gastric/enteric tube tip projects over the stomach. Side-hole projects 3.2 cm inferior to the gastroesophageal junction. Mild gas-filled dilation of the stomach. IMPRESSION: Gastric/enteric tube tip projects over the stomach. Electronically Signed   By: Agustin Cree M.D.   On: 05/14/2023 20:33   DG Chest Port 1 View Result Date: 05/14/2023 CLINICAL DATA:  Intubated EXAM: PORTABLE CHEST 1 VIEW COMPARISON:  05/06/2023 FINDINGS: Endotracheal tube tip is about 2.5 cm superior to carina. Left upper extremity central venous catheter tip of superimposes the cavoatrial region. Cardiomegaly. Dense left lower lobe consolidation and probable small left effusion. Improved aeration right base compared to prior. Aortic atherosclerosis. No pneumothorax IMPRESSION: 1. Endotracheal tube tip about 2.5 cm superior to carina 2. Cardiomegaly with small left-sided effusion and dense left lung base consolidation similar compared to prior 3. Improved aeration at the right base Electronically Signed   By: Jasmine Pang M.D.   On: 05/14/2023 20:33     Medications:     Scheduled Medications:  amiodarone  100 mg Per Tube Daily   Chlorhexidine Gluconate Cloth  6 each Topical Daily   docusate  100 mg Per Tube BID   famotidine  20 mg  Per Tube Daily   feeding supplement (PROSource TF20)  60 mL Per Tube Daily   free water  30 mL Per Tube Q4H   insulin aspart  0-15 Units Subcutaneous Q4H   mupirocin ointment  1 Application Nasal BID   nutrition supplement (JUVEN)  1 packet Per Tube BID BM   mouth rinse  15 mL Mouth Rinse Q2H   polyethylene glycol  17 g Per Tube Daily   thiamine  100 mg Per Tube Daily    Infusions:   ceFAZolin (ANCEF) IV 2 g (05/16/23 1153)   feeding supplement (VITAL 1.5 CAL) 40 mL/hr at 05/16/23 1000   fentaNYL infusion INTRAVENOUS 50 mcg/hr (05/16/23 1000)   norepinephrine (LEVOPHED) Adult infusion 11 mcg/min (05/16/23 1000)   propofol (DIPRIVAN) infusion 20 mcg/kg/min (05/16/23 1000)   vasopressin 0.03 Units/min (05/16/23 1143)    PRN Medications: fentaNYL (SUBLIMAZE) injection, midazolam, mouth rinse, polyethylene glycol   Assessment/Plan:   VF arrest - Mixed cardiogenic / septic shock post arrest - Lactic acid 4.3>1.4 -  HsTrop 52>160>384>605>790. Demand ischemia post arrest - Keep K >4 and Mg >2 - QTc on initial EKG 585 post arrest. Now 430. Continue amio 100 daily. Can increase as needed - Wean NE and VP as tolerated - Previously did not want ICD Not candidate for ICD currently with infection or guarded prognosis. Can consider LifeVest if/when he improves - Currently not candidate for cath - Attempt to wean from vent to continue GOC discussions   2. Acute on chronic systolic CHF: With recent mixed cardiogenic/septic shock  - Echo 4/25 unchanged EF 20-25%, global HK, moderate LVH, severe RV dysfunction, mild-moderate MR, moderate-severe TR, IVC dilated. He looks dry on exam . - Volume status mildly elevated. Will give lasix 80 IV x 1 to facilitate potential extubation  - GDMT on hold with elevated renal function and pressor requirements.   3. Acute hypoxic respiratory failure in setting of cardiac arrest - vent management per CCM - diurese as above  4. CAD with elevated  troponin/NSTEMI - suspect primarily demand ischemia given low level trop elevation with arrest but given primary VF arrest will need ischemic eval if SCr permits - last cath 2017 with LAD 50%, 40% LCx 80% prox -> stent RCA 40%/30%/40% - if recovers will need to consider cath but renal function and overall condition likely prohibitive    5. AKI on CKD stage 3: Baseline creatinine around 1.7. Suspect due to septic shock with cardiorenal component. SCr 2.4>2.8>2.55> 2.60 today   6. Atrial fibrillation: Persistent. S/P DC-CV 05/07/22 with conversion to SR. Remains in NSR today.  - Continue amiodarone at reduced dose 100 mg daily.  - Warfarin on hold - INR 2.7 Recheck INR. If <2 can start heparin gtt.  - PharmD suggesting possible change to apixaban 2.5 bid at discharge (I think this would be safer for him)    7. Chronic LLE wound: Suspect source for MSSA bacteremia.  - improving  8. . MSSA bacteremia: Source is likely LLE wound (growing MSSA), also has C4/C5 prevertebral collection concerning for discitis. TEE with no endocarditis.  ID saw.  - Continue cefazolin x 6 wks.    9. C-spine prevertebral edema: Upper c-spine with edema in the C4-C5 disc, ?infectious. No drainable fluid collection per IR.  - He is in a c-spine collar.  - Neurosurgery following, no plan for surgery.   10. GOC - Palliative Care involved - D/w CCM at bedside  CRITICAL CARE Performed by: Arvilla Meres  Total critical care time: 45 minutes  Critical care time was exclusive of separately billable procedures and treating other patients.  Critical care was necessary to treat or prevent imminent or life-threatening deterioration.  Critical care was time spent personally by me (independent of midlevel providers or residents) on the following activities: development of treatment plan with patient and/or surrogate as well as nursing, discussions with consultants, evaluation of patient's response to treatment,  examination of patient, obtaining history from patient or surrogate, ordering and performing treatments and interventions, ordering and review of laboratory studies, ordering and review of radiographic studies, pulse oximetry and re-evaluation of patient's condition.     Length of Stay: 2   Arvilla Meres MD 05/16/2023, 12:31 PM  Advanced Heart Failure Team Pager [phone] (M-F; 7a - 4p)  Please contact [facility] for night-coverage after hours (4p -7a ) and weekends on [email or web address]

## 2023-05-16 NOTE — Progress Notes (Signed)
 Daily Progress Note   Patient Name: [name].       Date: 05/16/2023 DOB: 1945/09/08  Age: 78 y.o. MRN#: [id number] Attending Physician: Erin Fulling, MD Primary Care Physician: Glori Luis, MD (Inactive) Admit Date: 05/14/2023  Reason for Consultation/Follow-up: Establishing goals of care  Subjective: Notes and labs reviewed.  Into see patient.  He is currently resting in bed on ventilator support.  No family at bedside.  Stepped out to call patient's wife.  She states she is considering her wishes on care moving forward.  She states that she will need to speak with other family members prior to making definitive decisions regarding his care including changes to CODE STATUS.  Spoke with CCM who is having similar discussions with wife.  Length of Stay: 2  Current Medications: Scheduled Meds:   amiodarone  100 mg Per Tube Daily   Chlorhexidine Gluconate Cloth  6 each Topical Daily   docusate  100 mg Per Tube BID   famotidine  20 mg Per Tube Daily   feeding supplement (PROSource TF20)  60 mL Per Tube Daily   free water  30 mL Per Tube Q4H   insulin aspart  0-15 Units Subcutaneous Q4H   mupirocin ointment  1 Application Nasal BID   nutrition supplement (JUVEN)  1 packet Per Tube BID BM   mouth rinse  15 mL Mouth Rinse Q2H   polyethylene glycol  17 g Per Tube Daily   thiamine  100 mg Per Tube Daily    Continuous Infusions:   ceFAZolin (ANCEF) IV Stopped (05/16/23 1224)   feeding supplement (VITAL 1.5 CAL) 40 mL/hr at 05/16/23 1246   fentaNYL infusion INTRAVENOUS 75 mcg/hr (05/16/23 1246)   norepinephrine (LEVOPHED) Adult infusion 9 mcg/min (05/16/23 1246)   propofol (DIPRIVAN) infusion Stopped (05/16/23 1223)   vasopressin 0.03 Units/min (05/16/23 1246)    PRN  Meds: fentaNYL (SUBLIMAZE) injection, midazolam, mouth rinse, polyethylene glycol  Physical Exam Constitutional:      Comments: Eyes closed  Pulmonary:     Comments: On ventilator support            Vital Signs: BP 138/63   Pulse 62   Temp 98.6 F (37 C)   Resp (!) 32   Ht 5\' 9"  (1.753 m)   Wt 67 kg  SpO2 97%   BMI 21.81 kg/m  SpO2: SpO2: 97 % O2 Device: O2 Device: Ventilator O2 Flow Rate: O2 Flow Rate (L/min): 15 L/min  Intake/output summary:  Intake/Output Summary (Last 24 hours) at 05/16/2023 1433 Last data filed at 05/16/2023 1246 Gross per 24 hour  Intake 1471.82 ml  Output 755 ml  Net 716.82 ml   LBM: Last BM Date : 05/12/23 Baseline Weight: Weight: 67.5 kg Most recent weight: Weight: 67 kg   Patient Active Problem List   Diagnosis Date Noted   Protein-calorie malnutrition, severe 05/15/2023   Cardiac arrest (HCC) 05/14/2023   Persistent atrial fibrillation (HCC) 05/11/2023   NICM (nonischemic cardiomyopathy) (HCC) 05/11/2023   Spinal epidural abscess 05/07/2023   MSSA bacteremia 05/06/2023   Non-traumatic rhabdomyolysis 05/06/2023   Severe sepsis (HCC) 05/03/2023   Traumatic rhabdomyolysis (HCC) 05/03/2023   Accidental fall from bed, initial encounter 05/03/2023   Supratherapeutic INR 05/03/2023   Elevated troponin 05/03/2023   Abnormal LFTs--possible biliary tract disease 05/03/2023   Pneumonia, possible 05/03/2023   Syncope, presumed 05/03/2023   Acute ST elevation myocardial infarction (STEMI) of inferior wall (HCC) 05/03/2023   Abnormal MRI, cervical spine 05/03/2023   Endocarditis 05/03/2023   Right shoulder pain 10/23/2022   Hyperkalemia 09/18/2022   Allergic rhinitis 05/25/2022   Atherosclerosis of native arteries of extremity with intermittent claudication (HCC) 03/21/2022   Long term (current) use of anticoagulants 10/25/2021   Rash in adult 10/15/2021   Atrial fibrillation with rapid ventricular response (HCC) 05/29/2021   Hydrocele,  left 11/01/2020   Status post laparoscopic hernia repair 09/27/2020   Anemia in chronic kidney disease 04/08/2019   Stage 3a chronic kidney disease (HCC) 04/08/2019   Hyperlipidemia 04/28/2018   Blistering of skin 01/23/2018   Leg wound, left, initial encounter 01/02/2018   Absent pedal pulses 01/02/2018   Onychomycosis 01/02/2018   Leukopenia 09/28/2017   Thrombocytopenia (HCC) 09/28/2017   B12 deficiency 09/28/2017   Renal mass 09/10/2017   Diabetes (HCC) 09/10/2017   Leg wound, right, initial encounter 09/10/2017   Acute renal failure superimposed on stage 3b chronic kidney disease (HCC) 08/14/2017   Anemia 09/12/2015   CAD S/P percutaneous coronary angioplasty    Hypertensive heart disease    CAD (coronary artery disease)    Mixed Ischemic and Non-ischemic Cardiomyopathy    Chronic HFrEF (heart failure with reduced ejection fraction) (HCC)    Bilateral lower extremity edema 02/10/2015   Abdominal pain 02/10/2015   Cough 02/10/2015   Neuropathy (HCC) 09/16/2014   Tubular adenoma of colon 07/20/2014   History of colon cancer 07/20/2014   Benign colon polyp 07/20/2014   Hypertension 07/20/2014   Hyperplastic colon polyp 07/20/2014    Palliative Care Assessment & Plan    Recommendations/Plan: Wife advised that she will need to talk to family members prior to making any formal changes in care including CODE STATUS. PMT will monitor over the weekend for needs.  Code Status:    Code Status Orders  (From admission, onward)           Start     Ordered   05/14/23 2029  Full code  Continuous       Question:  By:  Answer:  Default: patient does not have capacity for decision making, no surrogate or prior directive available   05/14/23 2029           Code Status History     Date Active Date Inactive Code Status Order ID Comments User Context  05/03/2023 1959 05/14/2023 1751 Full Code [id number]  Andris Baumann, MD ED   05/29/2021 1926 06/03/2021 2140 Full Code  [id number]  Jonah Blue, MD ED   08/14/2017 1311 08/17/2017 2256 Full Code [id number]  Houston Siren, MD ED   Care plan was discussed with CCM  Thank you for allowing the Palliative Medicine Team to assist in the care of this patient.  Morton Stall, NP  Please contact Palliative Medicine Team phone at [phone] for questions and concerns.

## 2023-05-17 DIAGNOSIS — I959 Hypotension, unspecified: Secondary | ICD-10-CM | POA: Diagnosis not present

## 2023-05-17 DIAGNOSIS — E43 Unspecified severe protein-calorie malnutrition: Secondary | ICD-10-CM | POA: Diagnosis not present

## 2023-05-17 DIAGNOSIS — I469 Cardiac arrest, cause unspecified: Secondary | ICD-10-CM | POA: Diagnosis not present

## 2023-05-17 DIAGNOSIS — J9601 Acute respiratory failure with hypoxia: Secondary | ICD-10-CM | POA: Diagnosis not present

## 2023-05-17 DIAGNOSIS — Z515 Encounter for palliative care: Secondary | ICD-10-CM | POA: Diagnosis not present

## 2023-05-17 DIAGNOSIS — I5021 Acute systolic (congestive) heart failure: Secondary | ICD-10-CM | POA: Diagnosis not present

## 2023-05-17 DIAGNOSIS — J9602 Acute respiratory failure with hypercapnia: Secondary | ICD-10-CM | POA: Diagnosis not present

## 2023-05-17 LAB — GLUCOSE, CAPILLARY
Glucose-Capillary: 172 mg/dL — ABNORMAL HIGH (ref 70–99)
Glucose-Capillary: 197 mg/dL — ABNORMAL HIGH (ref 70–99)
Glucose-Capillary: 206 mg/dL — ABNORMAL HIGH (ref 70–99)
Glucose-Capillary: 208 mg/dL — ABNORMAL HIGH (ref 70–99)
Glucose-Capillary: 220 mg/dL — ABNORMAL HIGH (ref 70–99)
Glucose-Capillary: 239 mg/dL — ABNORMAL HIGH (ref 70–99)
Glucose-Capillary: 239 mg/dL — ABNORMAL HIGH (ref 70–99)
Glucose-Capillary: 257 mg/dL — ABNORMAL HIGH (ref 70–99)

## 2023-05-17 LAB — COOXEMETRY PANEL
Carboxyhemoglobin: 1.9 % — ABNORMAL HIGH (ref 0.5–1.5)
Methemoglobin: <0.7 % (ref 0.0–1.5)
O2 Saturation: 72.4 %
Total hemoglobin: 9.5 g/dL — ABNORMAL LOW (ref 12.0–16.0)
Total oxygen content: 71 %

## 2023-05-17 LAB — CBC
HCT: 29.3 % — ABNORMAL LOW (ref 39.0–52.0)
Hemoglobin: 9.2 g/dL — ABNORMAL LOW (ref 13.0–17.0)
MCH: 32.7 pg (ref 26.0–34.0)
MCHC: 31.4 g/dL (ref 30.0–36.0)
MCV: 104.3 fL — ABNORMAL HIGH (ref 80.0–100.0)
Platelets: 116 10*3/uL — ABNORMAL LOW (ref 150–400)
RBC: 2.81 MIL/uL — ABNORMAL LOW (ref 4.22–5.81)
RDW: 15.8 % — ABNORMAL HIGH (ref 11.5–15.5)
WBC: 6.6 10*3/uL (ref 4.0–10.5)
nRBC: 0 % (ref 0.0–0.2)

## 2023-05-17 LAB — PROTIME-INR
INR: 3.5 — ABNORMAL HIGH (ref 0.8–1.2)
INR: 3.6 — ABNORMAL HIGH (ref 0.8–1.2)
Prothrombin Time: 35.3 s — ABNORMAL HIGH (ref 11.4–15.2)
Prothrombin Time: 36 s — ABNORMAL HIGH (ref 11.4–15.2)

## 2023-05-17 LAB — MAGNESIUM: Magnesium: 2.1 mg/dL (ref 1.7–2.4)

## 2023-05-17 LAB — PHOSPHORUS: Phosphorus: 3.5 mg/dL (ref 2.5–4.6)

## 2023-05-17 LAB — BASIC METABOLIC PANEL WITH GFR
Anion gap: 9 (ref 5–15)
BUN: 67 mg/dL — ABNORMAL HIGH (ref 8–23)
CO2: 29 mmol/L (ref 22–32)
Calcium: 8.8 mg/dL — ABNORMAL LOW (ref 8.9–10.3)
Chloride: 96 mmol/L — ABNORMAL LOW (ref 98–111)
Creatinine, Ser: 2.4 mg/dL — ABNORMAL HIGH (ref 0.61–1.24)
GFR, Estimated: 27 mL/min — ABNORMAL LOW (ref >60)
Glucose, Bld: 253 mg/dL — ABNORMAL HIGH (ref 70–99)
Potassium: 4.1 mmol/L (ref 3.5–5.1)
Sodium: 134 mmol/L — ABNORMAL LOW (ref 135–145)

## 2023-05-17 MED ORDER — PHYTONADIONE 5 MG PO TABS
5.0000 mg | ORAL_TABLET | Freq: Once | ORAL | Status: AC
  Administered 2023-05-17: 5 mg
  Filled 2023-05-17: qty 1

## 2023-05-17 MED ORDER — INSULIN ASPART 100 UNIT/ML IJ SOLN
2.0000 [IU] | INTRAMUSCULAR | Status: DC
  Administered 2023-05-17 – 2023-05-18 (×4): 2 [IU] via SUBCUTANEOUS
  Filled 2023-05-17 (×3): qty 1

## 2023-05-17 NOTE — IPAL (Signed)
  Interdisciplinary Goals of Care Family Meeting   Date carried out: 05/17/2023  Location of the meeting: Phone conference  Member's involved: Physician, Bedside Registered Nurse, and Family Member or next of kin    GOALS OF CARE DISCUSSION  The Clinical status was relayed to family in detail- Wife  Updated and notified of patients medical condition- Patient remains unresponsive and will not open eyes to command.   Patient with increased WOB and using accessory muscles to breathe Explained to family course of therapy and the modalities  Patient with Progressive multiorgan failure with a very high probablity of a very minimal chance of meaningful recovery despite all aggressive and optimal medical therapy.    PATIENT REMAINS FULL CODE     Family are satisfied with Plan of action and management. All questions answered  Additional CC time 25 mins   Hercules Hasler Santiago Glad, M.D.  [facility]  Medical Director [facility] Medical Director [facility] Cardio-Pulmonary Department

## 2023-05-17 NOTE — Plan of Care (Signed)
   Problem: Fluid Volume: Goal: Ability to maintain a balanced intake and output will improve Outcome: Progressing   Problem: Metabolic: Goal: Ability to maintain appropriate glucose levels will improve Outcome: Progressing   Problem: Nutritional: Goal: Maintenance of adequate nutrition will improve Outcome: Progressing

## 2023-05-17 NOTE — Plan of Care (Signed)
  Problem: Activity: Goal: Ability to tolerate increased activity will improve Outcome: Progressing   Problem: Activity: Goal: Risk for activity intolerance will decrease Outcome: Progressing   Problem: Nutrition: Goal: Adequate nutrition will be maintained Outcome: Progressing   Problem: Elimination: Goal: Will not experience complications related to bowel motility Outcome: Progressing Goal: Will not experience complications related to urinary retention Outcome: Progressing   Problem: Respiratory: Goal: Ability to maintain a clear airway and adequate ventilation will improve Outcome: Not Progressing   Problem: Role Relationship: Goal: Method of communication will improve Outcome: Not Progressing   Problem: Health Behavior/Discharge Planning: Goal: Ability to identify and utilize available resources and services will improve Outcome: Not Progressing Goal: Ability to manage health-related needs will improve Outcome: Not Progressing   Problem: Education: Goal: Knowledge of General Education information will improve Description: Including pain rating scale, medication(s)/side effects and non-pharmacologic comfort measures Outcome: Not Progressing   Problem: Health Behavior/Discharge Planning: Goal: Ability to manage health-related needs will improve Outcome: Not Progressing

## 2023-05-17 NOTE — Progress Notes (Signed)
 Daily Progress Note   Patient Name: [name].       Date: 05/17/2023 DOB: 1945/04/21  Age: 78 y.o. MRN#: [id number] Attending Physician: Erin Fulling, MD Primary Care Physician: Glori Luis, MD (Inactive) Admit Date: 05/14/2023  Reason for Consultation/Follow-up: Establishing goals of care  HPI/Brief Hospital Review: 78 yo M presenting to [facility] ED on 05/14/23 for evaluation after cardiac arrest.   History obtained per chart review as patient unable to participate in interview at this time. Patient was being taken down in the elevator after being discharged from the hospital when he became unresponsive and agonal, no palpable pulses in V-fib arrest. Patient defibrillated with 200 J with subsequent asystole, ACLS continued with ROSC after 5 minutes. Patient transported to ED.   Of note patient had been hospitalized from 05/03/23 - 05/14/23 with cardiology, ID & neuro-surgery consulted. Patient had arrived from home after being found by his wife after an un-witnessed fall and was unable to get up which is not his baseline. He was admitted by [facility] for treatment of Severe Sepsis suspect secondary to cervical abscess & MSSA bacteremia from LEFT wound, AKI on CKD stage 3a, Traumatic Rhabdomyolysis, Transaminitis, possible CAP, Acute on Chronic HFrEF, Supra therapeutic INR and thrombocytopenia. He was placed in a C-collar, not a candidate for surgical intervention. Started on Antibiotics, cardioverted into SR and ultimately considered stable for discharge as labs and condition had improved. TEE negative for endocarditis.  Palliative Medicine consulted for assisting with goals of care conversations.  Subjective: Extensive chart review has been completed prior to meeting patient including labs,  vital signs, imaging, progress notes, orders, and available advanced directive documents from current and previous encounters.    Spoke with CCM team this AM, wife mentioned having all children visit at bedside later today around 5pm for GOC meeting.  Returned to CCU, no family at bedside. Called and spoke with wife, she is not planning on returning to bedside until tomorrow, time set to meet at 2:30 pm.  Thank you for allowing the Palliative Medicine Team to assist in the care of this patient.  Total time:  25 minutes  Time spent includes: Detailed review of medical records (labs, imaging, vital signs), medically appropriate exam (mental status, respiratory, cardiac, skin), discussed with treatment team, counseling and educating patient, family and  staff, documenting clinical information, medication management and coordination of care.  Leeanne Deed, DNP, AGNP-C Palliative Medicine   Please contact Palliative Medicine Team phone at [phone] for questions and concerns.

## 2023-05-17 NOTE — Progress Notes (Signed)
 PHARMACY - ANTICOAGULATION CONSULT NOTE  Pharmacy Consult for Heparin  Indication: atrial fibrillation - transitioning from warfarin   Allergies  Allergen Reactions   Shellfish Allergy Other (See Comments) and Hives    Pt. instructed by MD to avoid seafood    Patient Measurements: Height: 5\' 9"  (175.3 cm) Weight: 67 kg (147 lb 11.3 oz) IBW/kg (Calculated) : 70.7  Vital Signs: Temp: 99.7 F (37.6 C) (04/11 0400) Temp Source: Bladder (04/11 0000) BP: 130/48 (04/11 0400) Pulse Rate: 29 (04/11 0400)  Labs: Recent Labs    05/15/23 0027 05/15/23 0342 05/15/23 0819 05/15/23 1519 05/15/23 2117 05/16/23 0434 05/17/23 0433  HGB  --  12.4*  --   --   --  11.8* 9.2*  HCT  --  37.5*  --   --   --  35.9* 29.3*  PLT  --  216  --   --   --  196 116*  LABPROT  --  25.5*  --  26.9* 29.5* 28.8* 35.3*  INR  --  2.3*  --  2.5* 2.8* 2.7* 3.5*  CREATININE  --  2.55*  --  2.57*  --  2.60* 2.40*  TROPONINIHS 605* 790* 672*  --   --   --   --     Estimated Creatinine Clearance: 24.8 mL/min (A) (by C-G formula based on SCr of 2.4 mg/dL (H)).   Medical History: Past Medical History:  Diagnosis Date   Adenocarcinoma of colon (HCC) 06/19/2013   a. moderately differentiated stage IIIc (T4bN2a M0) adenocarcinoma of colon  s/p colectomy followed by chemoRx with oxaliplatin & fluorouacil.   Anemia    CAD (coronary artery disease)    a. 02/2015 Cath: LM nl, LAD 50/40 mid/distal, LCX 80p, RCA 40p, 30d, RPL1 40, CO 3.27, CI 1.65; b. 06/09/15 PCI: LM minor irregs, m-dLAD 50%, dLAD 40%, pLCx 80% (s/p PCI/DES 0%), pRCA 40%, dRCA 30%, 1st RPLB 40%   Chronic kidney disease, stage 3a (HCC)    Chronic systolic CHF (congestive heart failure) (HCC)    a. 02/2015 Echo: EF 20-25%, diff HK, mod MR, mildy dil LA, mildly dil RV w/ mod RV syst dysfxn, mildly dil RA, mod TR, PASP ; b. 09/2017 Echo: EF 40-45%, diff HK. Gr1 DD. Mild to mod MR. Mildly to mod dil LA; c. 09/2019 Echo: EF 25-30%, glob HK, mod reduced RV  fxn, sev elev PASP. Mod dil LA. Mild-mod MR; d. 05/2021 Echo: EF 20-25%, glob HK, sev red RV fxn, sev BAE, sev MR/TR/TS, triv AI.   Diabetes mellitus without complication (HCC)    Essential hypertension    Hypertensive heart disease    Left inguinal hernia 05/2020   Mixed Ischemic and Non-ischemic Cardiomyopathy    a. 02/2015 EF 20-25%, diff HK; b. 09/2016 EF 40-45%; c. 09/2019 EF 25-30%; d. 05/2021 Echo: EF 20-25%, glob HK.   Persistent atrial fibrillation (HCC)    a. Dx 05/2021-->CHA2DS2VASc = 5-->eliquis.   Pulmonary hypertension (HCC)    Severe mitral regurgitation    a. 09/2017 Echo: Mild to mod MR; b. 09/2019 Echo: mild-mod MR; c. 05/2021 Echo: Sev MR/TR/TS.   Tubular adenoma of colon     Medications:  Medications Prior to Admission  Medication Sig Dispense Refill Last Dose/Taking   atorvastatin (LIPITOR) 40 MG tablet TAKE 1 TABLET(40 MG) BY MOUTH DAILY 90 tablet 3 05/14/2023   carvedilol (COREG) 3.125 MG tablet TAKE 1 TABLET BY MOUTH 2 TIMES DAILY WITH A MEAL 180 tablet 2 05/14/2023   cholecalciferol (  VITAMIN D3) 25 MCG (1000 UNIT) tablet Take 1,000 Units by mouth daily.   05/14/2023   FARXIGA 10 MG TABS tablet TAKE 1 TABLET(10 MG) BY MOUTH DAILY BEFORE BREAKFAST 30 tablet 6 05/14/2023   ferrous sulfate 325 (65 FE) MG tablet Take 325 mg by mouth daily with breakfast.   05/14/2023   gabapentin (NEURONTIN) 100 MG capsule Take 100 mg by mouth 3 (three) times daily. Taking 2 -100 mg pills in am and 1 -100 mg at night   05/14/2023   loratadine (CLARITIN) 10 MG tablet TAKE 1 TABLET(10 MG) BY MOUTH DAILY 30 tablet 11 05/14/2023   Magnesium Oxide 400 MG CAPS Take 1 capsule (400 mg total) by mouth daily. 90 capsule 3 05/14/2023   torsemide (DEMADEX) 20 MG tablet Take 40 mg one day then 20 mg the next day, so continue to alternate 40mg  to 20mg  every other day as per cardio   05/14/2023   amiodarone (PACERONE) 200 MG tablet Take 1 tablet (200 mg total) by mouth 2 (two) times daily. 60 tablet 0    Blood Glucose  Monitoring Suppl DEVI 1 each by Does not apply route daily. May substitute to any manufacturer covered by patient's insurance. 1 each 0    ceFAZolin (ANCEF) IVPB Inject 2 g into the vein every 12 (twelve) hours. Indication:  MSSA bacteremia and cervical spine abscess First Dose: Yes Last Day of Therapy:  06/14/2023 Labs - Once weekly:  CBC/D, CMP, ESR and CRP Fax weekly lab results  promptly to [phone]  and [phone] Method of administration: IV Push Method of administration may be changed at the discretion of home infusion pharmacist based upon assessment of the patient and/or caregiver's ability to self-administer the medication ordered. Please pull PIC at completion of IV antibiotics Call [phone] with critical value or questions 68 Units 0    Glucose Blood (BLOOD GLUCOSE TEST STRIPS) STRP 1 each by In Vitro route daily as needed. May substitute to any manufacturer covered by patient's insurance. 100 strip 0    [EXPIRED] HYDROcodone-acetaminophen (NORCO/VICODIN) 5-325 MG tablet Take 1-2 tablets by mouth every 6 (six) hours as needed for up to 1 day for moderate pain (pain score 4-6) or severe pain (pain score 7-10). 8 tablet 0    Lancet Device MISC 1 each by Does not apply route daily as needed. May substitute to any manufacturer covered by patient's insurance. 1 each 0    Lancets Misc. MISC 1 each by Does not apply route daily as needed. May substitute to any manufacturer covered by patient's insurance. 100 each 0    spironolactone (ALDACTONE) 25 MG tablet Take 0.5 tablets (12.5 mg total) by mouth daily. (Patient not taking: Reported on 05/14/2023) 15 tablet 0 Not Taking   warfarin (COUMADIN) 5 MG tablet TAKE 1/2 TO 1 TABLET BY MOUTH EVERY DAY AS DIRECTED BY COUMADIN CLINIC (Patient not taking: Reported on 05/14/2023) 70 tablet 1 Not Taking    Assessment: Pharmacy consulted to dose heparin for Afib in this 78 yo on warfarin PTA.     4/8:  INR @ 2021 = 2.3, therapeutic  4/9:  INR @  0342 = 2.3, therapeutic  4/9:  INR @ 1519 = 2.5, therapeutic 4/9:  INR @ 2117 = 2.8, therapeutic  4/10: INR @ 0434 = 2.7, therapeutic  4/11: INR @ 0433 = 3.5, SUPRAtherapeutic   CrCl = 21.4 ml/min  Goal of Therapy:  Heparin level 0.3-0.7 units/ml   Plan:  - Will start  heparin (950 units/hr + 4000 units bolus) once INR < 2 4/8:  INR @ 2021 = 2.3, therapeutic 4/9:  INR @ 0342 = 2.3, therapeutic  4/9:  INR @ 1519 = 2.5, therapeutic 4/9:  INR @ 2117 = 2.8, therapeutic 4/10: INR @ 0434 = 2.7, therapeutic  4/11: INR  @ 0433 = 3.5, SUPRAtherapeutic  - INR continue to rise despite not having warfarin since 3/31.  H and H continue to trend down.  Spoke with NP who agreed to give dose of Vit K 5 mcg PO X 1. - recheck INR 8 hrs after dose @ ~ 1400.  - follow CBC closely  Sami Roes D 05/17/2023,5:19 AM

## 2023-05-17 NOTE — Inpatient Diabetes Management (Signed)
 Inpatient Diabetes Program Recommendations  AACE/ADA: New Consensus Statement on Inpatient Glycemic Control (2015)  Target Ranges:  Prepandial:   less than 140 mg/dL      Peak postprandial:   less than 180 mg/dL (1-2 hours)      Critically ill patients:  140 - 180 mg/dL   Lab Results  Component Value Date   GLUCAP 197 (H) 05/17/2023   HGBA1C 6.9 (H) 05/06/2023    Review of Glycemic Control  Latest Reference Range & Units 05/16/23 11:23 05/16/23 17:01 05/16/23 19:51 05/16/23 23:37 05/17/23 05:05 05/17/23 07:49  Glucose-Capillary 70 - 99 mg/dL 811 (H) 914 (H) 782 (H) 220 (H) 257 (H) 197 (H)   Diabetes history: DM 2 Outpatient Diabetes medications:  Farxiga 10 mg daily Current orders for Inpatient glycemic control:  Novolog 0-15 units q 4 hours Vital 50 ml/hr  Inpatient Diabetes Program Recommendations:    May consider adding Novolog 2 units q 4 hours (tube feed coverage).    Thanks,  Lorenza Cambridge, RN, BC-ADM Inpatient Diabetes Coordinator Pager [phone]  (8a-5p)

## 2023-05-17 NOTE — Progress Notes (Signed)
 NAME:  Almond Fitzgibbon., MRN:  [id number], DOB:  09-Dec-1945, LOS: 3 ADMISSION DATE:  05/14/2023, CONSULTATION DATE:  05/14/23 REFERRING MD:  Dr. Marisa Severin,   CHIEF COMPLAINT:  cardiac arrest   History of Present Illness:  78 yo M presenting to [facility] ED on 05/14/23 for evaluation after cardiac arrest.  History obtained per chart review as patient unable to participate in interview at this time. Patient was being taken down in the elevator after being discharged from the hospital when he became unresponsive and agonal, no palpable pulses in V-fib arrest. Patient defibrillated with 200 J with subsequent asystole, ACLS continued with ROSC after 5 minutes. Patient transported to ED.  Of note patient had been hospitalized from 05/03/23 - 05/14/23 with cardiology, ID & neuro-surgery consulted. Patient had arrived from home after being found by his wife after an un-witnessed fall and was unable to get up which is not his baseline. He was admitted by [facility] for treatment of Severe Sepsis suspect secondary to cervical abscess & MSSA bacteremia from LEFT wound, AKI on CKD stage 3a, Traumatic Rhabdomyolysis, Transaminitis, possible CAP, Acute on Chronic HFrEF, Supra therapeutic INR and thrombocytopenia. He was placed in a C-collar, not a candidate for surgical intervention. Started on Antibiotics, cardioverted into SR and ultimately considered stable for discharge as labs and condition had improved. TEE negative for endocarditis.  ED course: Patient arrived post ROSC, then PEA arrested for less than 5 minutes requiring 1 round of ACLS prior to ROSC. He was emergently intubated requiring mechanical ventilatory support. Patient in severe circulatory shock. Labs significant for continue hypochloremia, hyperglycemia, slightly worse AKI on CKD, slightly elevated Alk Phos, hypoalbuminemia, continued elevation in T. Bili, elevated BNP, mildly elevated troponin, lactic acidosis without leukocytosis, elevated MCV and elevated but  much improved INR.   Chemistry: Na+: 135, K+: 3.8, Cl: 95, glucose: 218, BUN/Cr.: 58/ 2.81, Serum CO2/ AG: 26/ 14, Alk Phos: 141, albumin: 2.2, T.Bili: 1.5 Hematology: WBC: 9.2, Hgb: 12.0,  Troponin: 52 > 160, BNP: 2694, Lactic: 4.3, INR: 2.3 ABG: 7.50/ 42/ 285/ 32.8  CXR 05/14/23: cardiomegaly with small left sided effusion and dense left lung consolidation similar to prior x-ray. Improved aeration at right base CT head wo contrast 05/14/23: (Spoke with Dr. Rise Mu regarding CT head: negative for intracranial abnormality)  No acute intracranial abnormality. Moderately advanced chronic microvascular ischemic disease.  PCCM consulted for admission due to cardiac arrest requiring emergent intubation and mechanical ventilatory support with circulatory shock in the setting of known MSSA bacteremia and a suspected cervical abscess.  Pertinent  Medical History  HFrEF 20-25%, (Global hypokinesis, NICM, moderate - severe TR) Pulmonary HTN CKD stage 3a CAD Anemia Adenocarcinoma of colon PAF on warfarin Left inguinal hernia HTN T2DM  Significant Hospital Events: Including procedures, antibiotic start and stop dates in addition to other pertinent events   05/14/23: Re-admit to ICU following cardiac arrest during discharge requiring mechanical ventilatory support with circulatory shock in the setting of known MSSA bacteremia and a suspected cervical abscess. 4/10 severe end stage CHF, cardiogenic shock 4/11 cardiogenic shock, severe end stage sCHF  Interim History / Subjective:  Remains critically ill Remains intubated Requires VENT support for survival ENd stage cardiac failure Cardiogenic shock   Objective   Blood pressure 122/61, pulse (!) 30, temperature 100.2 F (37.9 C), resp. rate 20, height 5\' 9"  (1.753 m), weight 69.1 kg, SpO2 97%. CVP:  [7 mmHg-14 mmHg] 11 mmHg  Vent Mode: PRVC FiO2 (%):  [30 %-35 %]  30 % Set Rate:  [20 bmp] 20 bmp Vt Set:  [450 mL] 450 mL PEEP:  [5  cmH20] 5 cmH20 Plateau Pressure:  [13 cmH20-16 cmH20] 13 cmH20   Intake/Output Summary (Last 24 hours) at 05/17/2023 1610 Last data filed at 05/17/2023 0600 Gross per 24 hour  Intake 1543.77 ml  Output 2300 ml  Net -756.23 ml   Filed Weights   05/15/23 0415 05/16/23 0500 05/17/23 0500  Weight: 67 kg 67 kg 69.1 kg      REVIEW OF SYSTEMS  PATIENT IS UNABLE TO PROVIDE COMPLETE REVIEW OF SYSTEMS DUE TO SEVERE CRITICAL ILLNESS   PHYSICAL EXAMINATION:  GENERAL:critically ill appearing, +resp distress EYES: Pupils equal, round, reactive to light.  No scleral icterus.  MOUTH: Moist mucosal membrane. INTUBATED NECK: Supple.  PULMONARY: Lungs clear to auscultation, +rhonchi, CARDIOVASCULAR: S1 and S2.  Regular rate and rhythm GASTROINTESTINAL: Soft, nontender, -distended. Positive bowel sounds.  MUSCULOSKELETAL: No swelling, clubbing, or edema.  NEUROLOGIC: sedated SKIN:normal, warm to touch, Capillary refill delayed  Pulses present bilaterally    Assessment & Plan:  78 yo male with end stage systolic cardiac failure with near fatal arhythmia with v fib arrest  with cardiogenic shock leading to severe hypoxic resp failure  Severe ACUTE Hypoxic and Hypercapnic Respiratory Failure -continue Mechanical Ventilator support -Wean Fio2 and PEEP as tolerated -VAP/VENT bundle implementation - Wean PEEP & FiO2 as tolerated, maintain SpO2 > 88% - Head of bed elevated 30 degrees, VAP protocol in place - Plateau pressures less than 30 cm H20  - Intermittent chest x-ray & ABG PRN - Ensure adequate pulmonary hygiene  -will perform SAT/SBT when respiratory parameters are met    ACUTE SYSTOLIC CARDIAC FAILURE- EF 20% -follow up cardiology recs PAF/V fib - Acute on Chronic HFrEF PMHx: HFrEF, NICM, RV dysfunction, mild-mod MR, mod-severe TR, global hypokinesis, HLD, HTN, CAD - Continuous cardiac monitoring - Diurese with the use of IV lasix  as renal function and hemodynamics allow,  appreciate Heart failure team recommendations  - candidate for Normothermia Protocol  - Continue vasopressors: levophed & vasopressin PRN, to maintain MAP > 65 - Trend troponin, lactic has normalized, obtain co-ox panel - Cardiology consulted, appreciate input - Hold outpatient regimen: carvedilol, torsemide, spironolactone - amiodarone dosing  severity of shock/qt intgerval - if CT head clear will start heparin drip in lieu of warfarin, consider bridging once stabilized   NEUROLOGY ACUTE METABOLIC ENCEPHALOPATHY -need for sedation 8-10 minutes downtime, received 1 defibrillations, suspected anoxic injury Plan for WUA with wife at bedside   INFECTIOUS DISEASE -continue antibiotics as prescribed - Continue Cefazolin MSSA bacteremia secondary to LEFT foot wound infection Suspected Cervical Abscess - continue Cefazolin - Daily CBC, monitor WBC/ fever curve - Persistent hypotension consider stress dose steroids  - consult WOC - consider consulting ID PRN    Chronic Anticoagulation Recent Supratherapeutic INR - start heparin, consider bridging once stabilized - f/u INR  ACUTE KIDNEY INJURY/Renal Failure -continue Foley Catheter-assess need -Avoid nephrotoxic agents -Follow urine output, BMP -Ensure adequate renal perfusion, optimize oxygenation -Renal dose medications   Intake/Output Summary (Last 24 hours) at 05/17/2023 0714 Last data filed at 05/17/2023 0600 Gross per 24 hour  Intake 1543.77 ml  Output 2300 ml  Net -756.23 ml    ENDO - ICU hypoglycemic\Hyperglycemia protocol -check FSBS per protocol   GI GI PROPHYLAXIS as indicated NUTRITIONAL STATUS DIET-->TF's as tolerated Constipation protocol as indicated   ELECTROLYTES -follow labs as needed -replace as needed -pharmacy consultation and following  RESTRICTIVE TRANSFUSION PROTOCOL TRANSFUSION  IF HGB<7  or ACTIVE BLEEDING OR DX of ACUTE CORONARY SYNDROMES      Best Practice (right click and  "Reselect all SmartList Selections" daily)  Diet/type: NPO w/ meds via tube DVT prophylaxis SCD Pressure ulcer(s): N/A GI prophylaxis: H2B Lines: yes and it is still needed Foley:  Yes, and it is still needed Code Status:  full code Last date of multidisciplinary goals of care discussion [05/14/23]  Labs   CBC: Recent Labs  Lab 05/11/23 1000 05/11/23 2200 05/12/23 0640 05/13/23 0510 05/14/23 1823 05/15/23 0342 05/16/23 0434 05/17/23 0433  WBC 6.4 7.1 6.5 7.6 9.2 11.4* 10.3 6.6  NEUTROABS 5.1 5.6 4.9  --  7.0  --   --   --   HGB 11.7* 12.2* 12.8* 13.1 12.0* 12.4* 11.8* 9.2*  HCT 36.6* 38.2* 39.1 39.1 38.0* 37.5* 35.9* 29.3*  MCV 101.1* 101.3* 100.3* 96.5 102.4* 97.7 100.0 104.3*  PLT 136* 143* 152 177 198 216 196 116*    Basic Metabolic Panel: Recent Labs  Lab 05/14/23 1823 05/15/23 0342 05/15/23 1519 05/16/23 0434 05/17/23 0433  NA 135 135 136 135 134*  K 3.8 3.2* 4.5 4.6 4.1  CL 95* 97* 97* 96* 96*  CO2 26 30 31 30 29   GLUCOSE 218* 104* 156* 130* 253*  BUN 58* 55* 53* 55* 67*  CREATININE 2.81* 2.55* 2.57* 2.60* 2.40*  CALCIUM 8.3* 8.7* 8.6* 8.9 8.8*  MG  --  2.3  --  2.2 2.1  PHOS  --  3.2 3.3 3.4 3.5   GFR: Estimated Creatinine Clearance: 25.6 mL/min (A) (by C-G formula based on SCr of 2.4 mg/dL (H)). Recent Labs  Lab 05/14/23 1823 05/14/23 2021 05/15/23 0342 05/16/23 0434 05/17/23 0433  WBC 9.2  --  11.4* 10.3 6.6  LATICACIDVEN 4.3* 1.4  --   --   --     Liver Function Tests: Recent Labs  Lab 05/14/23 0421 05/14/23 1823 05/15/23 0342 05/15/23 1519  AST 25 41 31  --   ALT <5 5 <5  --   ALKPHOS 105 141* 125  --   BILITOT 1.5* 1.5* 1.4*  --   PROT 6.5 6.4* 6.8  --   ALBUMIN 2.2* 2.2* 2.3* 2.1*   No results for input(s): "LIPASE", "AMYLASE" in the last 168 hours. No results for input(s): "AMMONIA" in the last 168 hours.  ABG    Component Value Date/Time   PHART 7.5 (H) 05/14/2023 2059   PCO2ART 42 05/14/2023 2059   PO2ART 285 (H)  05/14/2023 2059   HCO3 32.8 (H) 05/14/2023 2059   O2SAT 72.4 05/17/2023 0500     Coagulation Profile: Recent Labs  Lab 05/15/23 0342 05/15/23 1519 05/15/23 2117 05/16/23 0434 05/17/23 0433  INR 2.3* 2.5* 2.8* 2.7* 3.5*    Cardiac Enzymes: No results for input(s): "CKTOTAL", "CKMB", "CKMBINDEX", "TROPONINI" in the last 168 hours.   HbA1C: Hemoglobin A1C  Date/Time Value Ref Range Status  10/23/2022 10:19 AM 5.6 4.0 - 5.6 % Final   Hgb A1c MFr Bld  Date/Time Value Ref Range Status  05/06/2023 04:24 AM 6.9 (H) 4.8 - 5.6 % Final    Comment:    (NOTE) Pre diabetes:          5.7%-6.4%  Diabetes:              >6.4%  Glycemic control for   <7.0% adults with diabetes   05/25/2022 09:41 AM 6.1 4.6 - 6.5 % Final  Comment:    Glycemic Control Guidelines for People with Diabetes:Non Diabetic:  <6%Goal of Therapy: <7%Additional Action Suggested:  >8%     CBG: Recent Labs  Lab 05/16/23 1123 05/16/23 1701 05/16/23 1951 05/16/23 2337 05/17/23 0505  GLUCAP 174* 206* 172* 220* 257*    Review of Systems:   UTA- patient intubated and sedated  Past Medical History:  He,  has a past medical history of Adenocarcinoma of colon (HCC) (06/19/2013), Anemia, CAD (coronary artery disease), Chronic kidney disease, stage 3a (HCC), Chronic systolic CHF (congestive heart failure) (HCC), Diabetes mellitus without complication (HCC), Essential hypertension, Hypertensive heart disease, Left inguinal hernia (05/2020), Mixed Ischemic and Non-ischemic Cardiomyopathy, Persistent atrial fibrillation (HCC), Pulmonary hypertension (HCC), Severe mitral regurgitation, and Tubular adenoma of colon.   Surgical History:   Past Surgical History:  Procedure Laterality Date   APPENDECTOMY N/A 06/19/2017   Location: [facility]; Surgeon: Claude Manges, MD   CARDIAC CATHETERIZATION Bilateral 02/24/2015   Procedure: Right/Left Heart Cath and Coronary Angiography;  Surgeon: Iran Ouch, MD;   Location: [facility] INVASIVE CV LAB;  Service: Cardiovascular;  Laterality: Bilateral;   CARDIAC CATHETERIZATION N/A 06/09/2015   Procedure: Coronary Stent Intervention (3.0 x 12 mm Xience Alpine DES to LCx);  Surgeon: Iran Ouch, MD;  Location: [facility] INVASIVE CV LAB;  Service: Cardiovascular;  Laterality: N/A   COLECTOMY  06/19/2017   Descending and proximal sigmoid colectomy, LEFT ureterolysis, partial cecectomy, appendectomy; Location: [facility]; Surgeon: Claude Manges, MD   COLONOSCOPY     COLONOSCOPY WITH PROPOFOL N/A 12/11/2016   Procedure: COLONOSCOPY WITH PROPOFOL;  Surgeon: Christena Deem, MD;  Location: [facility] END[facility]COPY;  Service: Endoscopy;  Laterality: N/A;   ESOPHAGOGASTRODUODENOSCOPY     HERNIA REPAIR     PORT-A-CATH REMOVAL N/A 07/12/2014   Procedure: REMOVAL PORT-A-CATH;  Surgeon: Duwaine Maxin, MD;  Location: [facility] ORS;  Service: General;  Laterality: N/A;   PORTACATH PLACEMENT Left 2015   TEE WITHOUT CARDIOVERSION N/A 05/07/2023   Procedure: ECHOCARDIOGRAM, TRANSESOPHAGEAL;  Surgeon: Laurey Morale, MD;  Location: [facility] ORS;  Service: Cardiovascular;  Laterality: N/A;     Social History:   reports that he has never smoked. He has never used smokeless tobacco. He reports that he does not currently use alcohol. He reports that he does not use drugs.   Family History:  His family history includes Cancer in his mother; Other in his father.   Allergies Allergies  Allergen Reactions   Shellfish Allergy Other (See Comments) and Hives    Pt. instructed by MD to avoid seafood     Home Medications  Prior to Admission medications   Medication Sig Start Date End Date Taking? Authorizing Provider  atorvastatin (LIPITOR) 40 MG tablet TAKE 1 TABLET(40 MG) BY MOUTH DAILY 12/21/22  Yes Iran Ouch, MD  carvedilol (COREG) 3.125 MG tablet TAKE 1 TABLET BY MOUTH 2 TIMES DAILY WITH A MEAL 03/25/23  Yes Iran Ouch, MD  cholecalciferol (VITAMIN D3) 25 MCG (1000 UNIT)  tablet Take 1,000 Units by mouth daily.   Yes [provider]  FARXIGA 10 MG TABS tablet TAKE 1 TABLET(10 MG) BY MOUTH DAILY BEFORE BREAKFAST 12/26/22  Yes Bensimhon, Bevelyn Buckles, MD  ferrous sulfate 325 (65 FE) MG tablet Take 325 mg by mouth daily with breakfast.   Yes [provider]  gabapentin (NEURONTIN) 100 MG capsule Take 100 mg by mouth 3 (three) times daily. Taking 2 -100 mg pills in am and 1 -100 mg at night 04/12/21  05/14/23 Yes [provider]  loratadine (CLARITIN) 10 MG tablet TAKE 1 TABLET(10 MG) BY MOUTH DAILY 05/25/22  Yes Glori Luis, MD  Magnesium Oxide 400 MG CAPS Take 1 capsule (400 mg total) by mouth daily. 12/12/21  Yes Creig Hines, NP  torsemide (DEMADEX) 20 MG tablet Take 40 mg one day then 20 mg the next day, so continue to alternate 40mg  to 20mg  every other day as per cardio 05/16/23  Yes Charise Killian, MD  amiodarone (PACERONE) 200 MG tablet Take 1 tablet (200 mg total) by mouth 2 (two) times daily. 05/14/23 06/13/23  Charise Killian, MD  Blood Glucose Monitoring Suppl DEVI 1 each by Does not apply route daily. May substitute to any manufacturer covered by patient's insurance. 01/23/23   Glori Luis, MD  ceFAZolin (ANCEF) IVPB Inject 2 g into the vein every 12 (twelve) hours. Indication:  MSSA bacteremia and cervical spine abscess First Dose: Yes Last Day of Therapy:  06/14/2023 Labs - Once weekly:  CBC/D, CMP, ESR and CRP Fax weekly lab results  promptly to [phone]  and [phone] Method of administration: IV Push Method of administration may be changed at the discretion of home infusion pharmacist based upon assessment of the patient and/or caregiver's ability to self-administer the medication ordered. Please pull PIC at completion of IV antibiotics Call [phone] with critical value or questions 05/14/23 06/17/23  Charise Killian, MD  Glucose Blood (BLOOD GLUCOSE TEST STRIPS) STRP 1 each by In Vitro route  daily as needed. May substitute to any manufacturer covered by patient's insurance. 01/23/23   Glori Luis, MD  HYDROcodone-acetaminophen (NORCO/VICODIN) 5-325 MG tablet Take 1-2 tablets by mouth every 6 (six) hours as needed for up to 1 day for moderate pain (pain score 4-6) or severe pain (pain score 7-10). 05/14/23 05/15/23  Charise Killian, MD  Lancet Device MISC 1 each by Does not apply route daily as needed. May substitute to any manufacturer covered by patient's insurance. 01/23/23   Glori Luis, MD  Lancets Misc. MISC 1 each by Does not apply route daily as needed. May substitute to any manufacturer covered by patient's insurance. 01/23/23   Glori Luis, MD  spironolactone (ALDACTONE) 25 MG tablet Take 0.5 tablets (12.5 mg total) by mouth daily. Patient not taking: Reported on 05/14/2023 05/15/23 06/14/23  Charise Killian, MD  warfarin (COUMADIN) 5 MG tablet TAKE 1/2 TO 1 TABLET BY MOUTH EVERY DAY AS DIRECTED BY COUMADIN CLINIC Patient not taking: Reported on 05/14/2023 12/26/22   Iran Ouch, MD      DVT/GI PRX  assessed I Assessed the need for Labs I Assessed the need for Foley I Assessed the need for Central Venous Line Family Discussion when available I Assessed the need for Mobilization I made an Assessment of medications to be adjusted accordingly Safety Risk assessment completed  CASE DISCUSSED IN MULTIDISCIPLINARY ROUNDS WITH ICU TEAM     Critical Care Time devoted to patient care services described in this note is 55 minutes.  Critical care was necessary to treat /prevent imminent and life-threatening deterioration. Overall, patient is critically ill, prognosis is guarded.  Patient with Multiorgan failure and at high risk for cardiac arrest and death.    Lucie Leather, M.D.  [facility]  Medical Director [facility] Medical Director [facility] Cardio-Pulmonary Department    [phone]

## 2023-05-17 NOTE — Progress Notes (Signed)
 Advanced Heart Failure Rounding Note   Subjective:    Remains on vent. Sedated.   NE down 11-> 4 VP 0.03  Co-ox 72%   Over 2L out with lasix yesterday  No further VT/VF on amio 100 daily    Objective:   Weight Range:  Vital Signs:   Temp:  [97.9 F (36.6 C)-100.2 F (37.9 C)] 100.2 F (37.9 C) (04/11 0745) Pulse Rate:  [29-90] 47 (04/11 0745) Resp:  [15-42] 20 (04/11 0745) BP: (99-147)/(40-93) 104/48 (04/11 0745) SpO2:  [89 %-100 %] 100 % (04/11 0745) FiO2 (%):  [30 %-35 %] 30 % (04/11 0743) Weight:  [69.1 kg] 69.1 kg (04/11 0500) Last BM Date : 05/16/23  Weight change: Filed Weights   05/15/23 0415 05/16/23 0500 05/17/23 0500  Weight: 67 kg 67 kg 69.1 kg    Intake/Output:   Intake/Output Summary (Last 24 hours) at 05/17/2023 0902 Last data filed at 05/17/2023 0600 Gross per 24 hour  Intake 1407.1 ml  Output 2300 ml  Net -892.9 ml     Physical Exam: General:  On vent. Frail and ill-appearing. Will arouse but not follow commands HEENT: normal + ETT Cor: PMI nondisplaced. Regular rate & rhythm. No rubs, gallops or murmurs. Lungs: clear Abdomen: soft, nontender, nondistended. No hepatosplenomegaly. No bruits or masses. Good bowel sounds. Extremities: no cyanosis, clubbing, rash, edema + healed wounds Neuro: intubated sedated   Telemetry: Sinus 50-60 frequent PACs/PVCs dropped beats Personally reviewed  Labs: Basic Metabolic Panel: Recent Labs  Lab 05/14/23 1823 05/15/23 0342 05/15/23 1519 05/16/23 0434 05/17/23 0433  NA 135 135 136 135 134*  K 3.8 3.2* 4.5 4.6 4.1  CL 95* 97* 97* 96* 96*  CO2 26 30 31 30 29   GLUCOSE 218* 104* 156* 130* 253*  BUN 58* 55* 53* 55* 67*  CREATININE 2.81* 2.55* 2.57* 2.60* 2.40*  CALCIUM 8.3* 8.7* 8.6* 8.9 8.8*  MG  --  2.3  --  2.2 2.1  PHOS  --  3.2 3.3 3.4 3.5    Liver Function Tests: Recent Labs  Lab 05/14/23 0421 05/14/23 1823 05/15/23 0342 05/15/23 1519  AST 25 41 31  --   ALT <5 5 <5  --    ALKPHOS 105 141* 125  --   BILITOT 1.5* 1.5* 1.4*  --   PROT 6.5 6.4* 6.8  --   ALBUMIN 2.2* 2.2* 2.3* 2.1*   No results for input(s): "LIPASE", "AMYLASE" in the last 168 hours. No results for input(s): "AMMONIA" in the last 168 hours.  CBC: Recent Labs  Lab 05/11/23 1000 05/11/23 2200 05/12/23 0640 05/13/23 0510 05/14/23 1823 05/15/23 0342 05/16/23 0434 05/17/23 0433  WBC 6.4 7.1 6.5 7.6 9.2 11.4* 10.3 6.6  NEUTROABS 5.1 5.6 4.9  --  7.0  --   --   --   HGB 11.7* 12.2* 12.8* 13.1 12.0* 12.4* 11.8* 9.2*  HCT 36.6* 38.2* 39.1 39.1 38.0* 37.5* 35.9* 29.3*  MCV 101.1* 101.3* 100.3* 96.5 102.4* 97.7 100.0 104.3*  PLT 136* 143* 152 177 198 216 196 116*    Cardiac Enzymes: No results for input(s): "CKTOTAL", "CKMB", "CKMBINDEX", "TROPONINI" in the last 168 hours.  BNP: BNP (last 3 results) Recent Labs    12/13/22 1140 04/16/23 1541 05/14/23 1823  BNP 2,083.6* 1,510.6* 2,694.0*    ProBNP (last 3 results) No results for input(s): "PROBNP" in the last 8760 hours.    Other results:  Imaging: No results found.    Medications:  Scheduled Medications:  amiodarone  100 mg Per Tube Daily   Chlorhexidine Gluconate Cloth  6 each Topical Daily   docusate  100 mg Per Tube BID   famotidine  20 mg Per Tube Daily   feeding supplement (PROSource TF20)  60 mL Per Tube Daily   free water  30 mL Per Tube Q4H   insulin aspart  0-15 Units Subcutaneous Q4H   mupirocin ointment  1 Application Nasal BID   nutrition supplement (JUVEN)  1 packet Per Tube BID BM   mouth rinse  15 mL Mouth Rinse Q2H   polyethylene glycol  17 g Per Tube Daily   thiamine  100 mg Per Tube Daily    Infusions:   ceFAZolin (ANCEF) IV Stopped (05/16/23 2318)   feeding supplement (VITAL 1.5 CAL) 40 mL/hr at 05/17/23 0556   fentaNYL infusion INTRAVENOUS 50 mcg/hr (05/17/23 0556)   norepinephrine (LEVOPHED) Adult infusion 4 mcg/min (05/17/23 0556)   propofol (DIPRIVAN) infusion 20 mcg/kg/min  (05/17/23 0556)   vasopressin 0.03 Units/min (05/17/23 0556)    PRN Medications: fentaNYL (SUBLIMAZE) injection, midazolam, mouth rinse, polyethylene glycol   Assessment/Plan:   VF arrest - Mixed cardiogenic / septic shock post arrest - Lactic acid 4.3>1.4 - HsTrop 52>160>384>605>790. Demand ischemia post arrest - Keep K >4 and Mg >2 - QTc on initial EKG 585 post arrest. Now 430. Continue amio 100 daily. Can increase as needed - Wean NE and VP as tolerated - Previously did not want ICD Not candidate for ICD currently with infection or guarded prognosis. - He continues to deteriorate and truly appears end-stage - GOC discussions ongoing. I think transitioning to comfort care may be best option with profound HF, recent arrest and MSOF  2. Acute on chronic systolic CHF: With recent mixed cardiogenic/septic shock  - Echo 4/25 unchanged EF 20-25%, global HK, moderate LVH, severe RV dysfunction, mild-moderate MR, moderate-severe TR, IVC dilated. He looks dry on exam . - Volume status ok Givve IV lasix as needed (got dose on 4/10) - GDMT on hold with elevated renal function and pressor requirements.  - As above, consider transition to comfort care  3. Acute hypoxic respiratory failure in setting of cardiac arrest - vent management per CCM   4. CAD with elevated troponin/NSTEMI - suspect primarily demand ischemia given low level trop elevation with arrest but given primary VF arrest will need ischemic eval if SCr permits - last cath 2017 with LAD 50%, 40% LCx 80% prox -> stent RCA 40%/30%/40% - not candidate for cath    5. AKI on CKD stage 3: Baseline creatinine around 1.7. Suspect due to septic shock with cardiorenal component. SCr 2.4>2.8>2.55> 2.60 -> 2.4 today   6. Atrial fibrillation: Persistent. S/P DC-CV 05/07/22 with conversion to SR. Remains in NSR today.  - Continue amiodarone at reduced dose 100 mg daily.  - Warfarin on hold - INR 3.5 Recheck INR. If <2 can start heparin  gtt.    7. Chronic LLE wound: Suspect source for MSSA bacteremia.  - improving  8. . MSSA bacteremia: Source is likely LLE wound (growing MSSA), also has C4/C5 prevertebral collection concerning for discitis. TEE with no endocarditis.  ID saw.  - Continue cefazolin x 6 wks.    9. C-spine prevertebral edema: Upper c-spine with edema in the C4-C5 disc, ?infectious. No drainable fluid collection per IR.  - He is in a c-spine collar.  - Neurosurgery following, no plan for surgery.   10. GOC - Palliative Care involved -  GOC discussions ongoing. I think transitioning to comfort care may be best option with profound HF, recent arrest and MSOF   [facility] will see again on Monday. Please call over the weekend for acute cardiology needs  CRITICAL CARE Performed by: Arvilla Meres  Total critical care time: 42 minutes  Critical care time was exclusive of separately billable procedures and treating other patients.  Critical care was necessary to treat or prevent imminent or life-threatening deterioration.  Critical care was time spent personally by me (independent of midlevel providers or residents) on the following activities: development of treatment plan with patient and/or surrogate as well as nursing, discussions with consultants, evaluation of patient's response to treatment, examination of patient, obtaining history from patient or surrogate, ordering and performing treatments and interventions, ordering and review of laboratory studies, ordering and review of radiographic studies, pulse oximetry and re-evaluation of patient's condition.     Length of Stay: 3   Arvilla Meres MD 05/17/2023, 9:02 AM  Advanced Heart Failure Team Pager [phone] (M-F; 7a - 4p)  Please contact [facility] for night-coverage after hours (4p -7a ) and weekends on [email or web address]

## 2023-05-18 ENCOUNTER — Inpatient Hospital Stay

## 2023-05-18 DIAGNOSIS — I959 Hypotension, unspecified: Secondary | ICD-10-CM | POA: Diagnosis not present

## 2023-05-18 DIAGNOSIS — Z515 Encounter for palliative care: Secondary | ICD-10-CM | POA: Diagnosis not present

## 2023-05-18 DIAGNOSIS — I469 Cardiac arrest, cause unspecified: Secondary | ICD-10-CM | POA: Diagnosis not present

## 2023-05-18 LAB — PHOSPHORUS: Phosphorus: 3.4 mg/dL (ref 2.5–4.6)

## 2023-05-18 LAB — BASIC METABOLIC PANEL WITH GFR
Anion gap: 8 (ref 5–15)
BUN: 81 mg/dL — ABNORMAL HIGH (ref 8–23)
CO2: 31 mmol/L (ref 22–32)
Calcium: 9 mg/dL (ref 8.9–10.3)
Chloride: 98 mmol/L (ref 98–111)
Creatinine, Ser: 2.3 mg/dL — ABNORMAL HIGH (ref 0.61–1.24)
GFR, Estimated: 29 mL/min — ABNORMAL LOW (ref >60)
Glucose, Bld: 210 mg/dL — ABNORMAL HIGH (ref 70–99)
Potassium: 4 mmol/L (ref 3.5–5.1)
Sodium: 137 mmol/L (ref 135–145)

## 2023-05-18 LAB — GLUCOSE, CAPILLARY
Glucose-Capillary: 192 mg/dL — ABNORMAL HIGH (ref 70–99)
Glucose-Capillary: 220 mg/dL — ABNORMAL HIGH (ref 70–99)
Glucose-Capillary: 133 mg/dL — ABNORMAL HIGH (ref 70–99)
Glucose-Capillary: 130 mg/dL — ABNORMAL HIGH (ref 70–99)

## 2023-05-18 LAB — CBC
HCT: 25.2 % — ABNORMAL LOW (ref 39.0–52.0)
Hemoglobin: 8.1 g/dL — ABNORMAL LOW (ref 13.0–17.0)
MCH: 32.4 pg (ref 26.0–34.0)
MCHC: 32.1 g/dL (ref 30.0–36.0)
MCV: 100.8 fL — ABNORMAL HIGH (ref 80.0–100.0)
Platelets: 92 10*3/uL — ABNORMAL LOW (ref 150–400)
RBC: 2.5 MIL/uL — ABNORMAL LOW (ref 4.22–5.81)
RDW: 15.7 % — ABNORMAL HIGH (ref 11.5–15.5)
WBC: 5 10*3/uL (ref 4.0–10.5)
nRBC: 0 % (ref 0.0–0.2)

## 2023-05-18 LAB — TRIGLYCERIDES: Triglycerides: 41 mg/dL (ref <150)

## 2023-05-18 LAB — COOXEMETRY PANEL
Carboxyhemoglobin: 2.1 % — ABNORMAL HIGH (ref 0.5–1.5)
Methemoglobin: <0.7 % (ref 0.0–1.5)
O2 Saturation: 79.8 %
Total hemoglobin: 8.8 g/dL — ABNORMAL LOW (ref 12.0–16.0)
Total oxygen content: 77.7 %

## 2023-05-18 LAB — PROTIME-INR
INR: 2.7 — ABNORMAL HIGH (ref 0.8–1.2)
Prothrombin Time: 28.7 s — ABNORMAL HIGH (ref 11.4–15.2)

## 2023-05-18 LAB — MAGNESIUM: Magnesium: 2.4 mg/dL (ref 1.7–2.4)

## 2023-05-18 MED ORDER — POLYETHYLENE GLYCOL 3350 17 G PO PACK: 17.0000 g | PACK | Freq: Every day | ORAL | Status: DC

## 2023-05-18 MED ORDER — ORAL CARE MOUTH RINSE: 15.0000 mL | OROMUCOSAL | Status: DC | PRN

## 2023-05-18 MED ORDER — INSULIN ASPART 100 UNIT/ML IJ SOLN
0.0000 [IU] | INTRAMUSCULAR | Status: DC
  Administered 2023-05-18 – 2023-05-19 (×3): 1 [IU] via SUBCUTANEOUS
  Administered 2023-05-19: 2 [IU] via SUBCUTANEOUS
  Administered 2023-05-19: 1 [IU] via SUBCUTANEOUS
  Filled 2023-05-18 (×5): qty 1

## 2023-05-18 MED ORDER — INSULIN ASPART 100 UNIT/ML IJ SOLN
0.0000 [IU] | INTRAMUSCULAR | Status: DC
  Administered 2023-05-18: 4 [IU] via SUBCUTANEOUS
  Administered 2023-05-18: 7 [IU] via SUBCUTANEOUS
  Filled 2023-05-18 (×3): qty 1

## 2023-05-18 MED ORDER — INSULIN ASPART 100 UNIT/ML IJ SOLN
3.0000 [IU] | INTRAMUSCULAR | Status: DC
  Administered 2023-05-18 (×2): 3 [IU] via SUBCUTANEOUS
  Filled 2023-05-18 (×3): qty 1

## 2023-05-18 NOTE — Progress Notes (Signed)
 Daily Progress Note   Patient Name: [name].       Date: 05/18/2023 DOB: 07-15-1945  Age: 78 y.o. MRN#: [id number] Attending Physician: Cleve Dale, MD Primary Care Physician: Kent Pear, MD (Inactive) Admit Date: 05/14/2023  Reason for Consultation/Follow-up: Establishing goals of care  HPI/Brief Hospital Review: 78 yo M presenting to [facility] ED on 05/14/23 for evaluation after cardiac arrest.   History obtained per chart review as patient unable to participate in interview at this time. Patient was being taken down in the elevator after being discharged from the hospital when he became unresponsive and agonal, no palpable pulses in V-fib arrest. Patient defibrillated with 200 J with subsequent asystole, ACLS continued with ROSC after 5 minutes. Patient transported to ED.   Of note patient had been hospitalized from 05/03/23 - 05/14/23 with cardiology, ID & neuro-surgery consulted. Patient had arrived from home after being found by his wife after an un-witnessed fall and was unable to get up which is not his baseline. He was admitted by [facility] for treatment of Severe Sepsis suspect secondary to cervical abscess & MSSA bacteremia from LEFT wound, AKI on CKD stage 3a, Traumatic Rhabdomyolysis, Transaminitis, possible CAP, Acute on Chronic HFrEF, Supra therapeutic INR and thrombocytopenia. He was placed in a C-collar, not a candidate for surgical intervention. Started on Antibiotics, cardioverted into SR and ultimately considered stable for discharge as labs and condition had improved. TEE negative for endocarditis.   Palliative Medicine consulted for assisting with goals of care conversations.  Subjective: Extensive chart review has been completed prior to meeting patient including labs,  vital signs, imaging, progress notes, orders, and available advanced directive documents from current and previous encounters.    Visited with Mr. Con Decant in collaboration with Dr. Auston Left and wife-Jean at bedside. Dr. Auston Left and nursing staff initiated WUA/SBT while at bedside.  Medical updates were provided to [facility] by Dr. Auston Left and decisions to be made and discussed regarding care moving forward. Code status was also discussed. Benigno Brakeman shares she is unable to make these decisions alone, she was hopeful her children would be able to join her today and support her in making decisions. She shares her children have struggled with visiting Mr. Con Decant while he has required mechanical ventilation.  Benigno Brakeman shares she and Mr. Fawaz have been married for  over 30 years and that they have 2 children. Benigno Brakeman shares that Mr. Routt has a close relationship with his niece who arrived at bedside during time of visit.  Mr. Achord tolerated WUA/SBT, extubation recommended by Dr. Auston Left. Conversations had with wife and niece regarding plans in the event Mr. Glazer is unable to maintain his airway and oxygenation once extubated. We discussed possibility of reintubation which would then likely require trach/PEG placement versus shifting our focus to comfort care. Family wishes to proceed with extubation but unable to come to a decision regarding future goals of care.  Will need to attempt to have GOC conversations with Mr. Sar once his mentation has cleared. PMT will continue to follow closely.  Care plan was discussed with CCM team and nursing staff.  Thank you for allowing the Palliative Medicine Team to assist in the care of this patient.  Total time:  50 minutes  Time spent includes: Detailed review of medical records (labs, imaging, vital signs), medically appropriate exam (mental status, respiratory, cardiac, skin), discussed with treatment team, counseling and educating patient, family and staff, documenting clinical  information, medication management and coordination of care.  Isadore Marble, DNP, AGNP-C Palliative Medicine   Please contact Palliative Medicine Team phone at [phone] for questions and concerns.

## 2023-05-18 NOTE — Procedures (Signed)
 Extubation Procedure Note  Patient Details:   Name: [name]. DOB: [birth year] MRN: [id number]   Airway Documentation:    Vent end date: 05/18/23 Vent end time: 1518   Evaluation  O2 sats: stable throughout Complications: No apparent complications Patient did tolerate procedure well. Bilateral Breath Sounds: Clear   Yes able to cough and speak.  [name] 05/18/2023, 3:22 PM

## 2023-05-18 NOTE — Progress Notes (Signed)
 NAME:  Christopher Willden., MRN:  [id number], DOB:  [birth year], LOS: 4 ADMISSION DATE:  05/14/2023, CONSULTATION DATE:  05/14/23 REFERRING MD:  Dr. Demetrios Finders, CHIEF COMPLAINT:  cardiac arrest   History of Present Illness:  78 yo M presenting to [facility] ED on 05/14/23 for evaluation after cardiac arrest.  History obtained per chart review as patient unable to participate in interview at this time. Patient was being taken down in the elevator after being discharged from the hospital when he became unresponsive and agonal, no palpable pulses in V-fib arrest. Patient defibrillated with 200 J with subsequent asystole, ACLS continued with ROSC after 5 minutes. Patient transported to ED.  Of note patient had been hospitalized from 05/03/23 - 05/14/23 with cardiology, ID & neuro-surgery consulted. Patient had arrived from home after being found by his wife after an un-witnessed fall and was unable to get up which is not his baseline. He was admitted by [facility] for treatment of Severe Sepsis suspect secondary to cervical abscess & MSSA bacteremia from LEFT wound, AKI on CKD stage 3a, Traumatic Rhabdomyolysis, Transaminitis, possible CAP, Acute on Chronic HFrEF, Supra therapeutic INR and thrombocytopenia. He was placed in a C-collar, not a candidate for surgical intervention. Started on Antibiotics, cardioverted into SR and ultimately considered stable for discharge as labs and condition had improved. TEE negative for endocarditis.  ED course: Patient arrived post ROSC, then PEA arrested for less than 5 minutes requiring 1 round of ACLS prior to ROSC. He was emergently intubated requiring mechanical ventilatory support. Patient in severe circulatory shock. Labs significant for continue hypochloremia, hyperglycemia, slightly worse AKI on CKD, slightly elevated Alk Phos, hypoalbuminemia, continued elevation in T. Bili, elevated BNP, mildly elevated troponin, lactic acidosis without leukocytosis, elevated MCV and elevated but much  improved INR.   Medications given: etomidate & succinylcholine, levophed, versed & fentanyl drips started. ACLS medications Initial Vitals: 99, 20, 103, 54/34 & 97% on BVM Significant labs: (Labs/ Imaging personally reviewed) I, Recardo Canal Rust[place], AGACNP-BC, personally viewed and interpreted this ECG. EKG Interpretation: Date: 05/14/23, EKG Time: 17 : 44, Rate: 95, Rhythm: NSR, QRS Axis:  LAD, Intervals: normal, ST/T Wave abnormalities: non specific T wave abnormalities, Narrative Interpretation: NSR Chemistry: Na+: 135, K+: 3.8, Cl: 95, glucose: 218, BUN/Cr.: 58/ 2.81, Serum CO2/ AG: 26/ 14, Alk Phos: 141, albumin: 2.2, T.Bili: 1.5 Hematology: WBC: 9.2, Hgb: 12.0,  Troponin: 52 > 160, BNP: 2694, Lactic: 4.3, INR: 2.3 ABG: 7.50/ 42/ 285/ 32.8  CXR 05/14/23: cardiomegaly with small left sided effusion and dense left lung consolidation similar to prior x-ray. Improved aeration at right base CT head wo contrast 05/14/23: (Spoke with Dr. Virgia Griffins regarding CT head: negative for intracranial abnormality)  No acute intracranial abnormality. Moderately advanced chronic microvascular ischemic disease.  PCCM consulted for admission due to cardiac arrest requiring emergent intubation and mechanical ventilatory support with circulatory shock in the setting of known MSSA bacteremia and a suspected cervical abscess.  Pertinent  Medical History  HFrEF 20-25%, (Global hypokinesis, NICM, moderate - severe TR) Pulmonary HTN CKD stage 3a CAD Anemia Adenocarcinoma of colon PAF on warfarin Left inguinal hernia HTN T2DM  Significant Hospital Events: Including procedures, antibiotic start and stop dates in addition to other pertinent events   04/8: Re-admit to ICU following cardiac arrest during discharge requiring mechanical ventilatory support with circulatory shock in the setting of known MSSA bacteremia and a suspected cervical abscess 04/10: severe end stage CHF, cardiogenic shock 04/11:  cardiogenic shock, severe end stage  sCHF 04/12: No acute events overnight pt remains mechanically intubated on minimal vent setting settings.  Will perform WUA and SBT once pts wife arrives at bedside   Interim History / Subjective:  As outlined above under significant events   Objective   Blood pressure (!) 143/39, pulse (!) 48, temperature 99 F (37.2 C), resp. rate 20, height 5\' 9"  (1.753 m), weight 68.2 kg, SpO2 96%. CVP:  [2 mmHg-16 mmHg] 16 mmHg  Vent Mode: PRVC FiO2 (%):  [28 %-30 %] 28 % Set Rate:  [20 bmp] 20 bmp Vt Set:  [450 mL] 450 mL PEEP:  [5 cmH20] 5 cmH20 Plateau Pressure:  [18 cmH20-19 cmH20] 18 cmH20   Intake/Output Summary (Last 24 hours) at 05/18/2023 0737 Last data filed at 05/18/2023 0600 Gross per 24 hour  Intake 2808.06 ml  Output 1345 ml  Net 1463.06 ml   Filed Weights   05/16/23 0500 05/17/23 0500 05/18/23 0500  Weight: 67 kg 69.1 kg 68.2 kg    Examination: General: Acute on chronically-ill appearing male, NAD mechanically intubated  HEENT: MM pink/moist, anicteric, unable to assess for JVD due to cervical collar  Neuro: Awake but unable to follow commands but moving extremities, PERRL Cardio: Sinus bradycardia, s1s2, no m/r/g, 2+ radial/1+ distal via doppler, trace edema  Pulm: Diminished throughout,even, non labored  GI: +BS x4, soft, obese, non distended  GU: Indwelling foley catheter clear yellow urine  Skin: Chronic left foot wound, dry/cracked skin BLE Extremities: Warm/dry  Resolved Hospital Problem list     Assessment & Plan:   #Mechanical intubation pain/discomfort  - Maintain RASS goal 0 to -1 - PAD protocol to maintain RASS goal: propofol and fentanyl gtts  - WUA daily  - Avoid sedating medications as able   #Cardiac arrest: initial rhythm V-fib  #Circulatory shock #PAF- on chronic warfarin #Acute on Chronic HFrEF Hx: HFrEF, NICM, RV dysfunction, mild-mod MR, mod-severe TR, global hypokinesis, HLD, HTN, and CAD - Continuous  telemetry monitoring  - Trend coox and cvp - Prn levophed and vasopressin gtts to maintain map 65 or higher  - Troponin peaked at 790 - Heart Failure Team consulted appreciate input: deemed not a candidate for cardiac cath and recommending considering transitioning to comfort care due to profound HF - Diurese as bp and renal function permits  - Hold outpatient antihypertensives for now   #Acute hypoxic respiratory failure  #CAP #Mechanical ventilation  - Full vent support for now: vent settings reviewed and established  - Continue lung protective strategies  - Maintain plateau pressures less than 30 cm H2O - SBT's once all parameters met  - VAP bundle implemented - Intermittent CXR's and ABG's   #MSSA bacteremia secondary to LEFT foot wound infection #Suspected cervical abscess - Trend WBC and monitor fever curve  - Continue cefazolin with stop date of 06/14/23 - Continue soft cervical collar   #Recent supratherapeutic INR (on chronic coumadin)~slowly improving  #Thrombocytopenia  #Anemia without signs of bleeding  - Trend CBC and coags  - Transfuse for hgb less than 7 - Monitor for s/sx of bleeding  - SCD's for VTE px; will hold chemical px for now   #Acute kidney injury superimposed on CKD stage 3b - Trend BMP  - Replace electrolytes as indicated  - Strict I&O's - Avoid nephrotoxic agents as able   #Mild transaminitis~resolved   US  Abd Limited RUQ 05/03/23: Cholelithiasis without evidence of acute cholecystitis  - Trend hepatic function - Avoid hepatotoxic agents  #Type 2 diabetes mellitus  Hemoglobin A1C: 6.9 - CBG's q4hrs  - Resistant SSI and scheduled novolog  - Target range while in ICU: 140-180 - Follow ICU hyper/hypo-glycemia protocol  Pt HIGH risk for recurrent cardiac arrest and subsequently sudden death recommend changing code status to DNR/DNI.  Palliative Care consulted to assist with conversations regarding goal of care.  Will perform WUA and SBT once  pts wife arrives at bedside.   Best Practice (right click and "Reselect all SmartList Selections" daily)  Diet/type: TF's  DVT prophylaxis SCD Pressure ulcer(s): N/A GI prophylaxis: H2B Lines: yes and it is still needed Foley:  Yes, and it is still needed Code Status:  full code Last date of multidisciplinary goals of care discussion [05/18/23]  Labs   CBC: Recent Labs  Lab 05/11/23 1000 05/11/23 2200 05/12/23 0640 05/13/23 0510 05/14/23 1823 05/15/23 0342 05/16/23 0434 05/17/23 0433 05/18/23 0504  WBC 6.4 7.1 6.5   < > 9.2 11.4* 10.3 6.6 5.0  NEUTROABS 5.1 5.6 4.9  --  7.0  --   --   --   --   HGB 11.7* 12.2* 12.8*   < > 12.0* 12.4* 11.8* 9.2* 8.1*  HCT 36.6* 38.2* 39.1   < > 38.0* 37.5* 35.9* 29.3* 25.2*  MCV 101.1* 101.3* 100.3*   < > 102.4* 97.7 100.0 104.3* 100.8*  PLT 136* 143* 152   < > 198 216 196 116* 92*   < > = values in this interval not displayed.    Basic Metabolic Panel: Recent Labs  Lab 05/15/23 0342 05/15/23 1519 05/16/23 0434 05/17/23 0433 05/18/23 0504  NA 135 136 135 134* 137  K 3.2* 4.5 4.6 4.1 4.0  CL 97* 97* 96* 96* 98  CO2 30 31 30 29 31   GLUCOSE 104* 156* 130* 253* 210*  BUN 55* 53* 55* 67* 81*  CREATININE 2.55* 2.57* 2.60* 2.40* 2.30*  CALCIUM 8.7* 8.6* 8.9 8.8* 9.0  MG 2.3  --  2.2 2.1 2.4  PHOS 3.2 3.3 3.4 3.5 3.4   GFR: Estimated Creatinine Clearance: 26.4 mL/min (A) (by C-G formula based on SCr of 2.3 mg/dL (H)). Recent Labs  Lab 05/14/23 1823 05/14/23 2021 05/15/23 0342 05/16/23 0434 05/17/23 0433 05/18/23 0504  WBC 9.2  --  11.4* 10.3 6.6 5.0  LATICACIDVEN 4.3* 1.4  --   --   --   --     Liver Function Tests: Recent Labs  Lab 05/14/23 0421 05/14/23 1823 05/15/23 0342 05/15/23 1519  AST 25 41 31  --   ALT <5 5 <5  --   ALKPHOS 105 141* 125  --   BILITOT 1.5* 1.5* 1.4*  --   PROT 6.5 6.4* 6.8  --   ALBUMIN 2.2* 2.2* 2.3* 2.1*   No results for input(s): "LIPASE", "AMYLASE" in the last 168 hours. No results  for input(s): "AMMONIA" in the last 168 hours.  ABG    Component Value Date/Time   PHART 7.5 (H) 05/14/2023 2059   PCO2ART 42 05/14/2023 2059   PO2ART 285 (H) 05/14/2023 2059   HCO3 32.8 (H) 05/14/2023 2059   O2SAT 79.8 05/18/2023 0504     Coagulation Profile: Recent Labs  Lab 05/15/23 2117 05/16/23 0434 05/17/23 0433 05/17/23 1420 05/18/23 0504  INR 2.8* 2.7* 3.5* 3.6* 2.7*    Cardiac Enzymes: No results for input(s): "CKTOTAL", "CKMB", "CKMBINDEX", "TROPONINI" in the last 168 hours.   HbA1C: Hemoglobin A1C  Date/Time Value Ref Range Status  10/23/2022 10:19 AM 5.6 4.0 - 5.6 % Final  Hgb A1c MFr Bld  Date/Time Value Ref Range Status  05/06/2023 04:24 AM 6.9 (H) 4.8 - 5.6 % Final    Comment:    (NOTE) Pre diabetes:          5.7%-6.4%  Diabetes:              >6.4%  Glycemic control for   <7.0% adults with diabetes   05/25/2022 09:41 AM 6.1 4.6 - 6.5 % Final    Comment:    Glycemic Control Guidelines for People with Diabetes:Non Diabetic:  <6%Goal of Therapy: <7%Additional Action Suggested:  >8%     CBG: Recent Labs  Lab 05/17/23 0749 05/17/23 1112 05/17/23 1608 05/17/23 2329 05/18/23 0339  GLUCAP 197* 208* 239* 239* 192*    Review of Systems:   UTA- patient intubated and sedated  Past Medical History:  He,  has a past medical history of Adenocarcinoma of colon (HCC) (06/19/2013), Anemia, CAD (coronary artery disease), Chronic kidney disease, stage 3a (HCC), Chronic systolic CHF (congestive heart failure) (HCC), Diabetes mellitus without complication (HCC), Essential hypertension, Hypertensive heart disease, Left inguinal hernia (05/2020), Mixed Ischemic and Non-ischemic Cardiomyopathy, Persistent atrial fibrillation (HCC), Pulmonary hypertension (HCC), Severe mitral regurgitation, and Tubular adenoma of colon.   Surgical History:   Past Surgical History:  Procedure Laterality Date   APPENDECTOMY N/A 06/19/2017   Location: [facility]; Surgeon: Parker Bollard, MD   CARDIAC CATHETERIZATION Bilateral 02/24/2015   Procedure: Right/Left Heart Cath and Coronary Angiography;  Surgeon: Wenona Hamilton, MD;  Location: [facility] INVASIVE CV LAB;  Service: Cardiovascular;  Laterality: Bilateral;   CARDIAC CATHETERIZATION N/A 06/09/2015   Procedure: Coronary Stent Intervention (3.0 x 12 mm Xience Alpine DES to LCx);  Surgeon: Wenona Hamilton, MD;  Location: [facility] INVASIVE CV LAB;  Service: Cardiovascular;  Laterality: N/A   COLECTOMY  06/19/2017   Descending and proximal sigmoid colectomy, LEFT ureterolysis, partial cecectomy, appendectomy; Location: [facility]; Surgeon: Parker Bollard, MD   COLONOSCOPY     COLONOSCOPY WITH PROPOFOL N/A 12/11/2016   Procedure: COLONOSCOPY WITH PROPOFOL;  Surgeon: Deveron Fly, MD;  Location: [facility] ENDOSCOPY;  Service: Endoscopy;  Laterality: N/A;   ESOPHAGOGASTRODUODENOSCOPY     HERNIA REPAIR     PORT-A-CATH REMOVAL N/A 07/12/2014   Procedure: REMOVAL PORT-A-CATH;  Surgeon: Denese Finn, MD;  Location: [facility] ORS;  Service: General;  Laterality: N/A;   PORTACATH PLACEMENT Left 2015   TEE WITHOUT CARDIOVERSION N/A 05/07/2023   Procedure: ECHOCARDIOGRAM, TRANSESOPHAGEAL;  Surgeon: Darlis Eisenmenger, MD;  Location: [facility] ORS;  Service: Cardiovascular;  Laterality: N/A;     Social History:   reports that he has never smoked. He has never used smokeless tobacco. He reports that he does not currently use alcohol. He reports that he does not use drugs.   Family History:  His family history includes Cancer in his mother; Other in his father.   Allergies Allergies  Allergen Reactions   Shellfish Allergy Other (See Comments) and Hives    Pt. instructed by MD to avoid seafood     Home Medications  Prior to Admission medications   Medication Sig Start Date End Date Taking? Authorizing Provider  atorvastatin (LIPITOR) 40 MG tablet TAKE 1 TABLET(40 MG) BY MOUTH DAILY 12/21/22  Yes Wenona Hamilton, MD  carvedilol  (COREG) 3.125 MG tablet TAKE 1 TABLET BY MOUTH 2 TIMES DAILY WITH A MEAL 03/25/23  Yes Wenona Hamilton, MD  cholecalciferol (VITAMIN D3) 25 MCG (1000 UNIT) tablet Take 1,000 Units by  mouth daily.   Yes [provider]  FARXIGA 10 MG TABS tablet TAKE 1 TABLET(10 MG) BY MOUTH DAILY BEFORE BREAKFAST 12/26/22  Yes Bensimhon, Rheta Celestine, MD  ferrous sulfate 325 (65 FE) MG tablet Take 325 mg by mouth daily with breakfast.   Yes [provider]  gabapentin (NEURONTIN) 100 MG capsule Take 100 mg by mouth 3 (three) times daily. Taking 2 -100 mg pills in am and 1 -100 mg at night 04/12/21 05/14/23 Yes [provider]  loratadine (CLARITIN) 10 MG tablet TAKE 1 TABLET(10 MG) BY MOUTH DAILY 05/25/22  Yes Kent Pear, MD  Magnesium Oxide 400 MG CAPS Take 1 capsule (400 mg total) by mouth daily. 12/12/21  Yes Florette Hurry, NP  torsemide (DEMADEX) 20 MG tablet Take 40 mg one day then 20 mg the next day, so continue to alternate 40mg  to 20mg  every other day as per cardio 05/16/23  Yes Alphonsus Jeans, MD  amiodarone (PACERONE) 200 MG tablet Take 1 tablet (200 mg total) by mouth 2 (two) times daily. 05/14/23 06/13/23  Alphonsus Jeans, MD  Blood Glucose Monitoring Suppl DEVI 1 each by Does not apply route daily. May substitute to any manufacturer covered by patient's insurance. 01/23/23   Kent Pear, MD  ceFAZolin (ANCEF) IVPB Inject 2 g into the vein every 12 (twelve) hours. Indication:  MSSA bacteremia and cervical spine abscess First Dose: Yes Last Day of Therapy:  06/14/2023 Labs - Once weekly:  CBC/D, CMP, ESR and CRP Fax weekly lab results  promptly to [phone]  and [phone] Method of administration: IV Push Method of administration may be changed at the discretion of home infusion pharmacist based upon assessment of the patient and/or caregiver's ability to self-administer the medication ordered. Please pull PIC at completion of IV antibiotics Call  [phone] with critical value or questions 05/14/23 06/17/23  Alphonsus Jeans, MD  Glucose Blood (BLOOD GLUCOSE TEST STRIPS) STRP 1 each by In Vitro route daily as needed. May substitute to any manufacturer covered by patient's insurance. 01/23/23   Kent Pear, MD  HYDROcodone-acetaminophen (NORCO/VICODIN) 5-325 MG tablet Take 1-2 tablets by mouth every 6 (six) hours as needed for up to 1 day for moderate pain (pain score 4-6) or severe pain (pain score 7-10). 05/14/23 05/15/23  Alphonsus Jeans, MD  Lancet Device MISC 1 each by Does not apply route daily as needed. May substitute to any manufacturer covered by patient's insurance. 01/23/23   Kent Pear, MD  Lancets Misc. MISC 1 each by Does not apply route daily as needed. May substitute to any manufacturer covered by patient's insurance. 01/23/23   Kent Pear, MD  spironolactone (ALDACTONE) 25 MG tablet Take 0.5 tablets (12.5 mg total) by mouth daily. Patient not taking: Reported on 05/14/2023 05/15/23 06/14/23  Alphonsus Jeans, MD  warfarin (COUMADIN) 5 MG tablet TAKE 1/2 TO 1 TABLET BY MOUTH EVERY DAY AS DIRECTED BY COUMADIN CLINIC Patient not taking: Reported on 05/14/2023 12/26/22   Wenona Hamilton, MD     Critical care time: 40 minutes     Janey Meek, AGNP  Pulmonary/Critical Care Pager [phone] (please enter 7 digits) PCCM Consult Pager [phone] (please enter 7 digits)

## 2023-05-19 DIAGNOSIS — I119 Hypertensive heart disease without heart failure: Secondary | ICD-10-CM

## 2023-05-19 DIAGNOSIS — I5022 Chronic systolic (congestive) heart failure: Secondary | ICD-10-CM

## 2023-05-19 DIAGNOSIS — Z515 Encounter for palliative care: Secondary | ICD-10-CM | POA: Diagnosis not present

## 2023-05-19 DIAGNOSIS — I469 Cardiac arrest, cause unspecified: Secondary | ICD-10-CM

## 2023-05-19 LAB — BASIC METABOLIC PANEL WITH GFR
Anion gap: 12 (ref 5–15)
BUN: 90 mg/dL — ABNORMAL HIGH (ref 8–23)
CO2: 29 mmol/L (ref 22–32)
Calcium: 9.5 mg/dL (ref 8.9–10.3)
Chloride: 99 mmol/L (ref 98–111)
Creatinine, Ser: 1.99 mg/dL — ABNORMAL HIGH (ref 0.61–1.24)
GFR, Estimated: 34 mL/min — ABNORMAL LOW (ref >60)
Glucose, Bld: 137 mg/dL — ABNORMAL HIGH (ref 70–99)
Potassium: 4.1 mmol/L (ref 3.5–5.1)
Sodium: 140 mmol/L (ref 135–145)

## 2023-05-19 LAB — GLUCOSE, CAPILLARY
Glucose-Capillary: 135 mg/dL — ABNORMAL HIGH (ref 70–99)
Glucose-Capillary: 141 mg/dL — ABNORMAL HIGH (ref 70–99)
Glucose-Capillary: 138 mg/dL — ABNORMAL HIGH (ref 70–99)
Glucose-Capillary: 147 mg/dL — ABNORMAL HIGH (ref 70–99)

## 2023-05-19 LAB — CBC
HCT: 27.5 % — ABNORMAL LOW (ref 39.0–52.0)
Hemoglobin: 8.9 g/dL — ABNORMAL LOW (ref 13.0–17.0)
MCH: 32.4 pg (ref 26.0–34.0)
MCHC: 32.4 g/dL (ref 30.0–36.0)
MCV: 100 fL (ref 80.0–100.0)
Platelets: 87 10*3/uL — ABNORMAL LOW (ref 150–400)
RBC: 2.75 MIL/uL — ABNORMAL LOW (ref 4.22–5.81)
RDW: 15.7 % — ABNORMAL HIGH (ref 11.5–15.5)
WBC: 5 10*3/uL (ref 4.0–10.5)
nRBC: 0 % (ref 0.0–0.2)

## 2023-05-19 LAB — COOXEMETRY PANEL
Carboxyhemoglobin: 1.7 % — ABNORMAL HIGH (ref 0.5–1.5)
Methemoglobin: <0.7 % (ref 0.0–1.5)
O2 Saturation: 64.2 %
Total hemoglobin: 10.3 g/dL — ABNORMAL LOW (ref 12.0–16.0)
Total oxygen content: 63.1 %

## 2023-05-19 LAB — LACTIC ACID, PLASMA
Lactic Acid, Venous: 1.1 mmol/L (ref 0.5–1.9)
Lactic Acid, Venous: 1.2 mmol/L (ref 0.5–1.9)

## 2023-05-19 LAB — TROPONIN I (HIGH SENSITIVITY)
Troponin I (High Sensitivity): 79 ng/L — ABNORMAL HIGH (ref <18)
Troponin I (High Sensitivity): 81 ng/L — ABNORMAL HIGH (ref <18)

## 2023-05-19 LAB — MAGNESIUM: Magnesium: 2.5 mg/dL — ABNORMAL HIGH (ref 1.7–2.4)

## 2023-05-19 LAB — PROTIME-INR
INR: 1.9 — ABNORMAL HIGH (ref 0.8–1.2)
Prothrombin Time: 22.4 s — ABNORMAL HIGH (ref 11.4–15.2)

## 2023-05-19 MED ORDER — HEPARIN BOLUS VIA INFUSION
3400.0000 [IU] | Freq: Once | INTRAVENOUS | Status: AC
  Administered 2023-05-19: 3400 [IU] via INTRAVENOUS
  Filled 2023-05-19: qty 3400

## 2023-05-19 MED ORDER — HEPARIN (PORCINE) 25000 UT/250ML-% IV SOLN
1000.0000 [IU]/h | INTRAVENOUS | Status: DC
  Administered 2023-05-19: 1000 [IU]/h via INTRAVENOUS
  Filled 2023-05-19: qty 250

## 2023-05-19 MED ORDER — NITROGLYCERIN 0.4 MG SL SUBL: 0.4000 mg | SUBLINGUAL_TABLET | SUBLINGUAL | Status: DC | PRN

## 2023-05-19 MED ORDER — FAMOTIDINE 20 MG PO TABS
20.0000 mg | ORAL_TABLET | Freq: Every day | ORAL | Status: DC
  Administered 2023-05-19: 20 mg via ORAL
  Filled 2023-05-19: qty 1

## 2023-05-19 MED ORDER — MORPHINE SULFATE (PF) 2 MG/ML IV SOLN
2.0000 mg | Freq: Once | INTRAVENOUS | Status: AC
  Administered 2023-05-19: 2 mg via INTRAVENOUS
  Filled 2023-05-19: qty 1

## 2023-05-19 MED ORDER — AMIODARONE HCL 200 MG PO TABS
100.0000 mg | ORAL_TABLET | Freq: Every day | ORAL | Status: DC
  Administered 2023-05-19: 100 mg via ORAL
  Filled 2023-05-19: qty 1

## 2023-05-19 MED ORDER — NITROGLYCERIN 0.4 MG SL SUBL
SUBLINGUAL_TABLET | SUBLINGUAL | Status: AC
  Administered 2023-05-19: 0.4 mg via SUBLINGUAL
  Filled 2023-05-19: qty 1

## 2023-05-19 MED ORDER — THIAMINE HCL 100 MG PO TABS
100.0000 mg | ORAL_TABLET | Freq: Every day | ORAL | Status: DC
  Administered 2023-05-19: 100 mg via ORAL
  Filled 2023-05-19 (×2): qty 1

## 2023-05-20 LAB — GLUCOSE, CAPILLARY
Glucose-Capillary: 154 mg/dL — ABNORMAL HIGH (ref 70–99)
Glucose-Capillary: 184 mg/dL — ABNORMAL HIGH (ref 70–99)
Glucose-Capillary: 197 mg/dL — ABNORMAL HIGH (ref 70–99)

## 2023-05-22 ENCOUNTER — Encounter: Admitting: Cardiology

## 2023-05-27 ENCOUNTER — Other Ambulatory Visit: Payer: PPO

## 2023-05-29 ENCOUNTER — Ambulatory Visit: Payer: PPO

## 2023-05-29 ENCOUNTER — Ambulatory Visit: Payer: PPO | Admitting: Oncology

## 2023-06-04 ENCOUNTER — Inpatient Hospital Stay: Admitting: Infectious Diseases

## 2023-06-06 NOTE — Progress Notes (Signed)
 Paged per pt was actively dying-when this chaplain arrived with in minutes of page-pt had passed. Family at bedside (wife and son) Pt was able to express to his family that he was ready to go, and was at peace. Family grieving and sharing stories. Offered compassionate presence and listening ear-offered prayer over family and pt. If needs arise chaplain services is available

## 2023-06-06 NOTE — Progress Notes (Signed)
 PHARMACY - ANTICOAGULATION CONSULT NOTE  Pharmacy Consult for Heparin  Indication: atrial fibrillation - transitioning from warfarin   Allergies  Allergen Reactions   Shellfish Allergy Other (See Comments) and Hives    Pt. instructed by MD to avoid seafood    Patient Measurements: Height: 5\' 9"  (175.3 cm) Weight: 68.2 kg (150 lb 5.7 oz) IBW/kg (Calculated) : 70.7 Heparin Dosing Weight: 68.2 kg  Vital Signs: Temp: 97.3 F (36.3 C) (04/13 0700) Temp Source: Bladder (04/13 0700) BP: 150/68 (04/13 1119) Pulse Rate: 51 (04/13 1119)  Labs: Recent Labs    05/17/23 0433 05/17/23 1420 05/18/23 0504 05/16/2023 0055 05/23/2023 0319  HGB 9.2*  --  8.1*  --  8.9*  HCT 29.3*  --  25.2*  --  27.5*  PLT 116*  --  92*  --  87*  LABPROT 35.3* 36.0* 28.7*  --  22.4*  INR 3.5* 3.6* 2.7*  --  1.9*  CREATININE 2.40*  --  2.30*  --  1.99*  TROPONINIHS  --   --   --  79* 81*    Estimated Creatinine Clearance: 30.5 mL/min (A) (by C-G formula based on SCr of 1.99 mg/dL (H)).   Medical History: Past Medical History:  Diagnosis Date   Adenocarcinoma of colon (HCC) 06/19/2013   a. moderately differentiated stage IIIc (T4bN2a M0) adenocarcinoma of colon  s/p colectomy followed by chemoRx with oxaliplatin & fluorouacil.   Anemia    CAD (coronary artery disease)    a. 02/2015 Cath: LM nl, LAD 50/40 mid/distal, LCX 80p, RCA 40p, 30d, RPL1 40, CO 3.27, CI 1.65; b. 06/09/15 PCI: LM minor irregs, m-dLAD 50%, dLAD 40%, pLCx 80% (s/p PCI/DES 0%), pRCA 40%, dRCA 30%, 1st RPLB 40%   Chronic kidney disease, stage 3a (HCC)    Chronic systolic CHF (congestive heart failure) (HCC)    a. 02/2015 Echo: EF 20-25%, diff HK, mod MR, mildy dil LA, mildly dil RV w/ mod RV syst dysfxn, mildly dil RA, mod TR, PASP ; b. 09/2017 Echo: EF 40-45%, diff HK. Gr1 DD. Mild to mod MR. Mildly to mod dil LA; c. 09/2019 Echo: EF 25-30%, glob HK, mod reduced RV fxn, sev elev PASP. Mod dil LA. Mild-mod MR; d. 05/2021 Echo: EF 20-25%,  glob HK, sev red RV fxn, sev BAE, sev MR/TR/TS, triv AI.   Diabetes mellitus without complication (HCC)    Essential hypertension    Hypertensive heart disease    Left inguinal hernia 05/2020   Mixed Ischemic and Non-ischemic Cardiomyopathy    a. 02/2015 EF 20-25%, diff HK; b. 09/2016 EF 40-45%; c. 09/2019 EF 25-30%; d. 05/2021 Echo: EF 20-25%, glob HK.   Persistent atrial fibrillation (HCC)    a. Dx 05/2021-->CHA2DS2VASc = 5-->eliquis.   Pulmonary hypertension (HCC)    Severe mitral regurgitation    a. 09/2017 Echo: Mild to mod MR; b. 09/2019 Echo: mild-mod MR; c. 05/2021 Echo: Sev MR/TR/TS.   Tubular adenoma of colon     Medications:  Medications Prior to Admission  Medication Sig Dispense Refill Last Dose/Taking   atorvastatin (LIPITOR) 40 MG tablet TAKE 1 TABLET(40 MG) BY MOUTH DAILY 90 tablet 3 05/14/2023   carvedilol (COREG) 3.125 MG tablet TAKE 1 TABLET BY MOUTH 2 TIMES DAILY WITH A MEAL 180 tablet 2 05/14/2023   cholecalciferol (VITAMIN D3) 25 MCG (1000 UNIT) tablet Take 1,000 Units by mouth daily.   05/14/2023   FARXIGA 10 MG TABS tablet TAKE 1 TABLET(10 MG) BY MOUTH DAILY BEFORE BREAKFAST 30  tablet 6 05/14/2023   ferrous sulfate 325 (65 FE) MG tablet Take 325 mg by mouth daily with breakfast.   05/14/2023   gabapentin (NEURONTIN) 100 MG capsule Take 100 mg by mouth 3 (three) times daily. Taking 2 -100 mg pills in am and 1 -100 mg at night   05/14/2023   loratadine (CLARITIN) 10 MG tablet TAKE 1 TABLET(10 MG) BY MOUTH DAILY 30 tablet 11 05/14/2023   Magnesium Oxide 400 MG CAPS Take 1 capsule (400 mg total) by mouth daily. 90 capsule 3 05/14/2023   torsemide (DEMADEX) 20 MG tablet Take 40 mg one day then 20 mg the next day, so continue to alternate 40mg  to 20mg  every other day as per cardio   05/14/2023   amiodarone (PACERONE) 200 MG tablet Take 1 tablet (200 mg total) by mouth 2 (two) times daily. 60 tablet 0    Blood Glucose Monitoring Suppl DEVI 1 each by Does not apply route daily. May substitute to  any manufacturer covered by patient's insurance. 1 each 0    ceFAZolin (ANCEF) IVPB Inject 2 g into the vein every 12 (twelve) hours. Indication:  MSSA bacteremia and cervical spine abscess First Dose: Yes Last Day of Therapy:  06/14/2023 Labs - Once weekly:  CBC/D, CMP, ESR and CRP Fax weekly lab results  promptly to [phone]  and [phone] Method of administration: IV Push Method of administration may be changed at the discretion of home infusion pharmacist based upon assessment of the patient and/or caregiver's ability to self-administer the medication ordered. Please pull PIC at completion of IV antibiotics Call [phone] with critical value or questions 68 Units 0    Glucose Blood (BLOOD GLUCOSE TEST STRIPS) STRP 1 each by In Vitro route daily as needed. May substitute to any manufacturer covered by patient's insurance. 100 strip 0    [EXPIRED] HYDROcodone-acetaminophen (NORCO/VICODIN) 5-325 MG tablet Take 1-2 tablets by mouth every 6 (six) hours as needed for up to 1 day for moderate pain (pain score 4-6) or severe pain (pain score 7-10). 8 tablet 0    Lancet Device MISC 1 each by Does not apply route daily as needed. May substitute to any manufacturer covered by patient's insurance. 1 each 0    Lancets Misc. MISC 1 each by Does not apply route daily as needed. May substitute to any manufacturer covered by patient's insurance. 100 each 0    spironolactone (ALDACTONE) 25 MG tablet Take 0.5 tablets (12.5 mg total) by mouth daily. (Patient not taking: Reported on 05/14/2023) 15 tablet 0 Not Taking   warfarin (COUMADIN) 5 MG tablet TAKE 1/2 TO 1 TABLET BY MOUTH EVERY DAY AS DIRECTED BY COUMADIN CLINIC (Patient not taking: Reported on 05/14/2023) 70 tablet 1 Not Taking    Assessment: 78 year old with CAD status circumflex stenting, mixed ICM and NICM, HTN, HLD, CKD Stage III, colon cancer, chronic LLE wound, and Biventricular HF.  Pharmacy consulted to dose heparin for Afib in this 78 yo  on warfarin PTA.     Goal of Therapy:  Heparin level 0.3-0.7 units/ml Monitor platelets by anticoagulation protocol: Yes    Plan:  - Will start heparin at 1000 units/hr + 3400 units bolus now that INR < 2 --check heparin level 8 hours after starting - follow CBC closely  Adalberto Acton 05/15/2023,11:53 AM

## 2023-06-06 NOTE — Evaluation (Signed)
 Clinical/Bedside Swallow Evaluation Patient Details  Name: [name]. MRN: [id number] Date of Birth: Oct 04, 1945  Today's Date: 06/02/2023 Time: SLP Start Time (ACUTE ONLY): 0805 SLP Stop Time (ACUTE ONLY): 0855 SLP Time Calculation (min) (ACUTE ONLY): 50 min  Past Medical History:  Past Medical History:  Diagnosis Date   Adenocarcinoma of colon (HCC) 06/19/2013   a. moderately differentiated stage IIIc (T4bN2a M0) adenocarcinoma of colon  s/p colectomy followed by chemoRx with oxaliplatin & fluorouacil.   Anemia    CAD (coronary artery disease)    a. 02/2015 Cath: LM nl, LAD 50/40 mid/distal, LCX 80p, RCA 40p, 30d, RPL1 40, CO 3.27, CI 1.65; b. 06/09/15 PCI: LM minor irregs, m-dLAD 50%, dLAD 40%, pLCx 80% (s/p PCI/DES 0%), pRCA 40%, dRCA 30%, 1st RPLB 40%   Chronic kidney disease, stage 3a (HCC)    Chronic systolic CHF (congestive heart failure) (HCC)    a. 02/2015 Echo: EF 20-25%, diff HK, mod MR, mildy dil LA, mildly dil RV w/ mod RV syst dysfxn, mildly dil RA, mod TR, PASP ; b. 09/2017 Echo: EF 40-45%, diff HK. Gr1 DD. Mild to mod MR. Mildly to mod dil LA; c. 09/2019 Echo: EF 25-30%, glob HK, mod reduced RV fxn, sev elev PASP. Mod dil LA. Mild-mod MR; d. 05/2021 Echo: EF 20-25%, glob HK, sev red RV fxn, sev BAE, sev MR/TR/TS, triv AI.   Diabetes mellitus without complication (HCC)    Essential hypertension    Hypertensive heart disease    Left inguinal hernia 05/2020   Mixed Ischemic and Non-ischemic Cardiomyopathy    a. 02/2015 EF 20-25%, diff HK; b. 09/2016 EF 40-45%; c. 09/2019 EF 25-30%; d. 05/2021 Echo: EF 20-25%, glob HK.   Persistent atrial fibrillation (HCC)    a. Dx 05/2021-->CHA2DS2VASc = 5-->eliquis.   Pulmonary hypertension (HCC)    Severe mitral regurgitation    a. 09/2017 Echo: Mild to mod MR; b. 09/2019 Echo: mild-mod MR; c. 05/2021 Echo: Sev MR/TR/TS.   Tubular adenoma of colon    Past Surgical History:  Past Surgical History:  Procedure Laterality Date    APPENDECTOMY N/A 06/19/2017   Location: [facility]; Surgeon: Parker Bollard, MD   CARDIAC CATHETERIZATION Bilateral 02/24/2015   Procedure: Right/Left Heart Cath and Coronary Angiography;  Surgeon: Wenona Hamilton, MD;  Location: [facility] INVASIVE CV LAB;  Service: Cardiovascular;  Laterality: Bilateral;   CARDIAC CATHETERIZATION N/A 06/09/2015   Procedure: Coronary Stent Intervention (3.0 x 12 mm Xience [place] DES to LCx);  Surgeon: Wenona Hamilton, MD;  Location: [facility] INVASIVE CV LAB;  Service: Cardiovascular;  Laterality: N/A   COLECTOMY  06/19/2017   Descending and proximal sigmoid colectomy, LEFT ureterolysis, partial cecectomy, appendectomy; Location: [facility]; Surgeon: Parker Bollard, MD   COLONOSCOPY     COLONOSCOPY WITH PROPOFOL N/A 12/11/2016   Procedure: COLONOSCOPY WITH PROPOFOL;  Surgeon: Deveron Fly, MD;  Location: [facility] ENDOSCOPY;  Service: Endoscopy;  Laterality: N/A;   ESOPHAGOGASTRODUODENOSCOPY     HERNIA REPAIR     PORT-A-CATH REMOVAL N/A 07/12/2014   Procedure: REMOVAL PORT-A-CATH;  Surgeon: Denese Finn, MD;  Location: [facility] ORS;  Service: General;  Laterality: N/A;   PORTACATH PLACEMENT Left 2015   TEE WITHOUT CARDIOVERSION N/A 05/07/2023   Procedure: ECHOCARDIOGRAM, TRANSESOPHAGEAL;  Surgeon: Darlis Eisenmenger, MD;  Location: [facility] ORS;  Service: Cardiovascular;  Laterality: N/A;   HPI:  Pt is a 78 yo M presenting to [facility] initially on 05/03/2023 and D/C'd on 05/14/2023 but then brought to the  ED on 05/14/23 as he was leaving the hospital for evaluation after a cardiac arrest.  Patient was being taken down in the elevator after being discharged from the hospital when he became unresponsive and agonal, no palpable pulses in V-fib arrest. Patient defibrillated with 200 J with subsequent asystole, ACLS continued with ROSC after 5 minutes. Patient transported to ED. Of note patient was hospitalized from 3/28-4/8 with cardiology and ID and neurosurgery consultation.  Patient had  arrived from home after being found by his wife after an unwitnessed fall and was unable to get up which was different from his baseline.  He was then admitted at the [facility] service for severe sepsis secondary to cervical abscess and MSSA bacteremia as well as AKI CKD stage III 3a.  Patient was managed for acute on chronic HFrEF, supratherapeutic INR with thrombocytopenia.  Pt currently wears a C-collar d/t C-spine prevertebral edema- neurosugery.  Pt was intubated on 05/14/2023, then extubated on 05/18/2023; on [place] O2 support at this evatuation.   CXR currently: Lower lung volumes with persistent left infrahilar and lower lung  opacities.   Head CT: Moderately Advanced chronic microvascular ischemic disease. No acute changes.    Assessment / Plan / Recommendation  Clinical Impression   Pt seen for BSE this morning. Pt awake, followed basic 1-step commands w/ Cue and Time. Minimally verbal. Neck brace in place. Congested cough at baseline prior to po's. Unsure of pt's Cognitive functioning at Baseline- see Head CT. He requires MOD+ cues for follow through w/ task. [place] O2 support 4L; afebrile. WBC WNL.  Pt appears to present w/ concern for oropharyngeal phase dysphagia w/ potential overt pharyngeal phase swallowing deficits w/ Nectar consistency liquids(via tsp); No overt s/s noted w/ Puree trials given. Oropharyngeal phase dysphagia is suspected in setting of Acute/Chronic illness w/ recent oral intubation(~4 days) and current need to wear a C-collar d/t C-spine edema -- the C-collar restricts mandible ROM during swallowing. Suspect potential neuromuscular deficits/weakness in setting of illness.  Pt appeared to consume po trials of some Puree (APPLESAUCE) w/ No immediate, overt clinical s/s of aspiration during/post the trials.  Pt has Multiple challenging factors that could impact oropharyngeal swallowing which can increase risk for aspiration, dysphagia as well as decreased oral intake overall.   Pt appears  increased risk for aspiration/aspiration pneumonia and a full oral diet is Not recommended currently. Therapeutic trials of Puree (APPLESAUCE) is recommended w/ NSG Supervision following strict aspiration precautions.   During po trials of Nectar liquids via TSP and small tsp trials of puree (applesauce), pt exhibited a delayed cough 1x post trial of Nectar liquids- unsure if related to his Baseline congested coughing?Aaron Aas No overt clinical s/s noted w/ tsp trials of puree (applesauce) w/ this SLP and NSG (crushed meds mixed in 2 tsps) -- No overt coughing, decline in vocal quality, nor change in respiratory presentation during/post trials. O2 sats remained 96-97%, RR 20-24, HR 55-57(baseline). Oral phase appeared Min-Mod slow during bolus management and control of bolus propulsion for A-P transfer for swallowing, though min improved timing noted w/ the puree trials(viscosity?). Oral clearing achieved w/ all trial consistencies w/ Time given b/t trials.  OM Exam was cursory w/ generalized oral weakness noted but no unilateral oral weakness noted. Speech mumbled but intelligible w/ #s.  C-collar restricted mandible ROM which may have impacted the oral phase timing/control of boluses.   Recommend continue NPO status for meals but Therapeutic tsp trials of Puree (APPLESAUCE) w/ NSG and ST services during ongoing  assessment of swallowing in hopes to establish a least restrictive po diet consistency next 1-3 days. Strict aspiration precautions and NSG Supervision w/ applesauce trials. CRUSHED Pills in Applesauce. STOP giving po's if any overt, clinical s/s of aspiration noted. Oral care. NSG/MD updated. Precautions posted in room, chart.  SLP Visit Diagnosis: Dysphagia, oropharyngeal phase (R13.12) (acute/chronic illness and deconditioning; neck collar present)    Aspiration Risk  Moderate aspiration risk;Risk for inadequate nutrition/hydration    Diet Recommendation   NPO (therapeutic trials of applesauce w/  NSG; crushed meds in applesauce); oral care  Medication Administration: Crushed with puree    Other  Recommendations Recommended Consults:  (Dietician; Palliative Care f/u) Oral Care Recommendations: Oral care QID;Oral care before and after PO;Staff/trained caregiver to provide oral care Caregiver Recommendations:  (tbd)    Recommendations for follow up therapy are one component of a multi-disciplinary discharge planning process, led by the attending physician.  Recommendations may be updated based on patient status, additional functional criteria and insurance authorization.  Follow up Recommendations Follow physician's recommendations for discharge plan and follow up therapies (next venue of care)      Assistance Recommended at Discharge  FULL  Functional Status Assessment Patient has had a recent decline in their functional status and demonstrates the ability to make significant improvements in function in a reasonable and predictable amount of time.  Frequency and Duration min 2x/week  2 weeks       Prognosis Prognosis for improved oropharyngeal function: Guarded Barriers to Reach Goals: Cognitive deficits;Language deficits;Time post onset;Severity of deficits Barriers/Prognosis Comment: lengthy oral intubation; C-spine edema requring Collar restricting ROM; deconditioning      Swallow Study   General Date of Onset: 05/03/23 HPI: Pt is a 78 yo M presenting to [facility] initially on 05/03/2023 and D/C'd on 05/14/2023 but then brought to the ED on 05/14/23 as he was leaving the hospital for evaluation after a cardiac arrest.  Patient was being taken down in the elevator after being discharged from the hospital when he became unresponsive and agonal, no palpable pulses in V-fib arrest. Patient defibrillated with 200 J with subsequent asystole, ACLS continued with ROSC after 5 minutes. Patient transported to ED. Of note patient was hospitalized from 3/28-4/8 with cardiology and ID and neurosurgery  consultation.  Patient had arrived from home after being found by his wife after an unwitnessed fall and was unable to get up which was different from his baseline.  He was then admitted at the [facility] service for severe sepsis secondary to cervical abscess and MSSA bacteremia as well as AKI CKD stage III 3a.  Patient was managed for acute on chronic HFrEF, supratherapeutic INR with thrombocytopenia.  Pt currently wears a C-collar d/t C-spine prevertebral edema- neurosugery.  Pt was intubated on 05/14/2023, then extubated on 05/18/2023; on [place] O2 support at this evatuation.   CXR currently: Lower lung volumes with persistent left infrahilar and lower lung  opacities.   Head CT: Moderately Advanced chronic microvascular ischemic disease. No acute changes. Type of Study: Bedside Swallow Evaluation Previous Swallow Assessment: no Diet Prior to this Study: NPO Temperature Spikes Noted: No (wbc 5.0) Respiratory Status: Nasal cannula (4L) History of Recent Intubation: Yes Total duration of intubation (days): 4 days Date extubated: 05/18/23 Behavior/Cognition: Alert;Cooperative;Pleasant mood;Confused;Distractible;Requires cueing (Unsure of pt's Baseline Cognition-  see Head CT) Oral Cavity Assessment: Dry (sticky) Oral Care Completed by SLP: Yes Oral Cavity - Dentition: Missing dentition;Poor condition Vision:  (n/a) Self-Feeding Abilities: Total assist Patient Positioning: Upright in  bed (MAX assist) Baseline Vocal Quality: Low vocal intensity (gravely) Volitional Cough: Congested;Weak Volitional Swallow: Able to elicit    Oral/Motor/Sensory Function Overall Oral Motor/Sensory Function: Generalized oral weakness (no unilateral weakness noted w/ po's)   Ice Chips Ice chips: Not tested   Thin Liquid Thin Liquid: Not tested    Nectar Thick Nectar Thick Liquid: Impaired Presentation: Spoon (3 trials) Oral Phase Impairments: Reduced labial seal;Reduced lingual movement/coordination;Poor awareness of  bolus Oral phase functional implications: Prolonged oral transit Pharyngeal Phase Impairments: Suspected delayed Swallow (no immediate congested cough but 1 followed b/t trials(delayed))   Honey Thick Honey Thick Liquid: Not tested   Puree Puree: Impaired Presentation: Spoon (fed; 6 trials) Oral Phase Impairments: Reduced labial seal;Reduced lingual movement/coordination;Poor awareness of bolus (min) Oral Phase Functional Implications: Prolonged oral transit (min) Pharyngeal Phase Impairments:  (no overt s/s)   Solid     Solid: Not tested        Darla Edward, MS, CCC-SLP Speech Language Pathologist Rehab Services; [facility] [phone] (ascom) Jerimey Burridge 05/21/2023,12:35 PM

## 2023-06-06 NOTE — Evaluation (Signed)
 Physical Therapy Evaluation Patient Details Name: [name]. MRN: [id number] DOB: [birth year] Today's Date: 05/12/2023  History of Present Illness  Pt is a 78 y/o M admitted on 05/14/23. Pt was being d/c from the hospital when he became unresponsive & agonal with no palpable pulses in v-fib arrest. Pt required mechanical ventilatory support & had circulatory shock int he setting of known MSSA bacteremia & suspected cervical abscess. PMH: HFrEF, pulmonary HTn, CKD 3A, CAD, anemia, adenocarcinoma of colon, PAF, L inguinal hernia, HTN, DM2  Clinical Impression  Pt seen for PT evaluation with co-tx with OT for pt & therapists' safety. Pt is AxOx3 but demonstrates poor awareness of deficits, impaired problem solving. Pt requires max assist +2 for bed mobility, MAX assist for static sitting EOB 2/2 anterior LOB & minimal to no righting reactions; when asked, pt reports he does not feel like he's falling over. Pt tolerated sitting EOB ~1-2 minutes but had to return to bed for peri hygiene 2/2 incontinent BM. Will continue to follow pt acutely to progress mobility as able.        If plan is discharge home, recommend the following: Two people to help with walking and/or transfers;Two people to help with bathing/dressing/bathroom;Supervision due to cognitive status;Assistance with feeding   Can travel by private vehicle   No    Equipment Recommendations Other (comment) (defer to next venue)  Recommendations for Other Services  Rehab consult    Functional Status Assessment Patient has had a recent decline in their functional status and demonstrates the ability to make significant improvements in function in a reasonable and predictable amount of time.     Precautions / Restrictions Precautions Precautions: Fall;Cervical Recall of Precautions/Restrictions: Impaired Required Braces or Orthoses: Cervical Brace Cervical Brace: Hard collar Restrictions Weight Bearing Restrictions Per Provider  Order: No      Mobility  Bed Mobility Overal bed mobility: Needs Assistance Bed Mobility: Supine to Sit     Supine to sit: Max assist, HOB elevated, Used rails, +2 for safety/equipment, +2 for physical assistance     General bed mobility comments: Pt able to move BLE to EOB, max cuing to reach for bed rails, cuing to upright trunk but pt unable, requiring max assist, 2nd person present for safety & to provide minimal assistance.    Transfers                        Ambulation/Gait                  Stairs            Wheelchair Mobility     Tilt Bed    Modified Rankin (Stroke Patients Only)       Balance Overall balance assessment: Needs assistance Sitting-balance support: Feet supported, Bilateral upper extremity supported Sitting balance-[name]: Zero Sitting balance - Comments: anterior lean/LOB, poor to no righting reactions                                     Pertinent Vitals/Pain Pain Assessment Pain Assessment: No/denies pain    Home Living Family/patient expects to be discharged to:: Private residence Living Arrangements: Spouse/significant other;Children Available Help at Discharge: Family Type of Home: House Home Access: Stairs to enter Entrance Stairs-Rails: Doctor, general practice of Steps: 2     Home Equipment: Agricultural consultant (2 wheels);Cane - single point Additional Comments:  Information obtained from chart.    Prior Function Prior Level of Function : Independent/Modified Independent (information obtained from chart)             Mobility Comments: patient reports he is slow but no AD use       Extremity/Trunk Assessment   Upper Extremity Assessment Upper Extremity Assessment: Generalized weakness    Lower Extremity Assessment Lower Extremity Assessment: Generalized weakness    Cervical / Trunk Assessment Cervical / Trunk Assessment:  (cervical collar)  Communication    Communication Communication: No apparent difficulties Factors Affecting Communication: Difficulty expressing self;Reduced clarity of speech    Cognition Arousal: Alert Behavior During Therapy: WFL for tasks assessed/performed   PT - Cognitive impairments: Awareness, Memory, Orientation, Initiation, Sequencing, Problem solving, Safety/Judgement, Attention   Orientation impairments: Situation                   PT - Cognition Comments: not fully aware of situation, minimal to no awareness of anterior LOB while sitting EOB Following commands: Impaired Following commands impaired: Follows one step commands with increased time, Follows one step commands inconsistently     Cueing Cueing Techniques: Verbal cues, Tactile cues     General Comments General comments (skin integrity, edema, etc.): pt reported slight dizziness sitting EOB, VSS    Exercises     Assessment/Plan    PT Assessment Patient needs continued PT services  PT Problem List Decreased strength;Pain;Cardiopulmonary status limiting activity;Decreased cognition;Decreased range of motion;Decreased activity tolerance;Decreased balance;Decreased mobility;Decreased safety awareness;Decreased knowledge of use of DME;Decreased skin integrity       PT Treatment Interventions DME instruction;Balance training;Modalities;Gait training;Neuromuscular re-education;Stair training;Functional mobility training;Therapeutic activities;Therapeutic exercise;Wheelchair mobility training;Patient/family education;Cognitive remediation    PT Goals (Current goals can be found in the Care Plan section)  Acute Rehab PT Goals Patient Stated Goal: none stated PT Goal Formulation: Patient unable to participate in goal setting Time For Goal Achievement: 06/02/23 Potential to Achieve Goals: Fair    Frequency Min 2X/week     Co-evaluation PT/OT/SLP Co-Evaluation/Treatment: Yes Reason for Co-Treatment: Complexity of the patient's impairments  (multi-system involvement);Necessary to address cognition/behavior during functional activity;For patient/therapist safety PT goals addressed during session: Mobility/safety with mobility;Balance         AM-PAC PT "6 Clicks" Mobility  Outcome Measure Help needed turning from your back to your side while in a flat bed without using bedrails?: A Lot Help needed moving from lying on your back to sitting on the side of a flat bed without using bedrails?: Total Help needed moving to and from a bed to a chair (including a wheelchair)?: Total Help needed standing up from a chair using your arms (e.g., wheelchair or bedside chair)?: Total Help needed to walk in hospital room?: Total Help needed climbing 3-5 steps with a railing? : Total 6 Click Score: 7    End of Session Equipment Utilized During Treatment: Cervical collar Activity Tolerance: Patient tolerated treatment well (limited 2/2 incontinent BM, required assistance for peri hygiene then ongoing BM) Patient left: in bed;with call bell/phone within reach;with nursing/sitter in room Nurse Communication: Mobility status (skin tear noted to R forearm) PT Visit Diagnosis: Muscle weakness (generalized) (M62.81);Difficulty in walking, not elsewhere classified (R26.2);Other abnormalities of gait and mobility (R26.89)    Time: 1610-9604 PT Time Calculation (min) (ACUTE ONLY): 15 min   Charges:   PT Evaluation $PT Eval High Complexity: 1 High   PT General Charges $$ ACUTE PT VISIT: 1 Visit  Emaline Handsome, PT, DPT 05/28/2023, 2:41 PM   Venetta Gill 05/16/2023, 2:40 PM

## 2023-06-06 NOTE — Evaluation (Signed)
 Occupational Therapy Evaluation Patient Details Name: [name]. MRN: [id number] DOB: 1945/07/24 Today's Date: 05/20/2023   History of Present Illness   Pt is a 78 y/o M admitted on 05/14/23. Pt was being d/c from the hospital when he became unresponsive & agonal with no palpable pulses in v-fib arrest. Pt required mechanical ventilatory support & had circulatory shock int he setting of known MSSA bacteremia & suspected cervical abscess. PMH: HFrEF, pulmonary HTn, CKD 3A, CAD, anemia, adenocarcinoma of colon, PAF, L inguinal hernia, HTN, DM2     Clinical Impressions Pt was seen for OT evaluation this date. Pt presents to acute OT demonstrating impaired ADL performance and functional mobility 2/2 decreased cognition, strength, balance, coordination, and activity tolerance (See OT problem list for additional functional deficits). Pt currently requires +2 assist for all aspects of bed mobility, bed level toileting, and unable to maintain static sitting balance EOB without CGA to +2 assist. Pt denies feeling himself falling forward and doe snot right himself. Pt denies knowledge of having had a BM in the bed. VC for sequencing throughout session. Pt would benefit from skilled OT services to address noted impairments and functional limitations (see below for any additional details) in order to maximize safety and independence while minimizing falls risk and caregiver burden.     If plan is discharge home, recommend the following:   Two people to help with walking and/or transfers;Two people to help with bathing/dressing/bathroom;Supervision due to cognitive status;Direct supervision/assist for medications management;Direct supervision/assist for financial management;Assist for transportation;Assistance with cooking/housework;Help with stairs or ramp for entrance;Assistance with feeding     Functional Status Assessment   Patient has had a recent decline in their functional status and  demonstrates the ability to make significant improvements in function in a reasonable and predictable amount of time.     Equipment Recommendations   Other (comment) (defer)     Recommendations for Other Services         Precautions/Restrictions   Precautions Precautions: Fall;Cervical Recall of Precautions/Restrictions: Impaired Required Braces or Orthoses: Cervical Brace Cervical Brace: Hard collar Restrictions Weight Bearing Restrictions Per Provider Order: No     Mobility Bed Mobility Overal bed mobility: Needs Assistance Bed Mobility: Supine to Sit, Sit to Supine, Rolling Rolling: Mod assist   Supine to sit: Max assist, HOB elevated, Used rails, +2 for safety/equipment, +2 for physical assistance Sit to supine: Max assist, +2 for physical assistance   General bed mobility comments: Pt able to move BLE to EOB, max cuing to reach for bed rails, cuing to upright trunk but pt unable, requiring max assist, 2nd person present for safety & to provide minimal assistance.    Transfers                          Balance Overall balance assessment: Needs assistance Sitting-balance support: Feet supported, Bilateral upper extremity supported Sitting balance-[name]: Zero Sitting balance - Comments: anterior lean/LOB, poor to no righting reactions                                   ADL either performed or assessed with clinical judgement   ADL Overall ADL's : Needs assistance/impaired                 Upper Body Dressing : Sitting;Maximal assistance   Lower Body Dressing: Bed level;Maximal assistance  Toileting- Clothing Manipulation and Hygiene: Total assistance;Bed level               Vision         Perception         Praxis         Pertinent Vitals/Pain Pain Assessment Pain Assessment: No/denies pain     Extremity/Trunk Assessment Upper Extremity Assessment Upper Extremity Assessment: Generalized  weakness   Lower Extremity Assessment Lower Extremity Assessment: Generalized weakness   Cervical / Trunk Assessment Cervical / Trunk Assessment: Other exceptions Cervical / Trunk Exceptions: cervical collar   Communication Communication Communication: Impaired Factors Affecting Communication: Difficulty expressing self;Reduced clarity of speech   Cognition Arousal: Alert Behavior During Therapy: WFL for tasks assessed/performed Cognition: Cognition impaired   Orientation impairments: Time, Place, Person Awareness: Intellectual awareness impaired, Online awareness impaired   Attention impairment (select first level of impairment): Sustained attention Executive functioning impairment (select all impairments): Initiation, Organization, Sequencing, Problem solving, Reasoning                   Following commands: Impaired Following commands impaired: Follows one step commands with increased time, Follows one step commands inconsistently     Cueing  General Comments   Cueing Techniques: Verbal cues;Tactile cues  pt reported slight dizziness sitting EOB, VSS   Exercises     Shoulder Instructions      Home Living Family/patient expects to be discharged to:: Private residence Living Arrangements: Spouse/significant other;Children Available Help at Discharge: Family Type of Home: House Home Access: Stairs to enter Secretary/administrator of Steps: 2 Entrance Stairs-Rails: Right;Left                 Home Equipment: Agricultural consultant (2 wheels);Cane - single point   Additional Comments: Information obtained from chart.      Prior Functioning/Environment Prior Level of Function : Independent/Modified Independent (information obtained from chart)             Mobility Comments: patient reports he is slow but no AD use      OT Problem List: Decreased strength;Decreased range of motion;Decreased activity tolerance;Impaired balance (sitting and/or  standing);Decreased cognition;Decreased safety awareness;Decreased knowledge of use of DME or AE   OT Treatment/Interventions: Self-care/ADL training;Therapeutic exercise;Energy conservation;DME and/or AE instruction;Therapeutic activities;Cognitive remediation/compensation;Patient/family education;Balance training      OT Goals(Current goals can be found in the care plan section)   Acute Rehab OT Goals Patient Stated Goal: pt does not state OT Goal Formulation: With patient Time For Goal Achievement: 06/02/23 Potential to Achieve Goals: Fair ADL Goals Pt Will Perform Grooming: with set-up;with contact guard assist;sitting Pt Will Transfer to Toilet: ambulating;with mod assist (LRAD) Pt Will Perform Toileting - Clothing Manipulation and hygiene: sitting/lateral leans;sit to/from stand;with min assist   OT Frequency:  Min 2X/week    Co-evaluation PT/OT/SLP Co-Evaluation/Treatment: Yes Reason for Co-Treatment: Complexity of the patient's impairments (multi-system involvement);Necessary to address cognition/behavior during functional activity;For patient/therapist safety PT goals addressed during session: Mobility/safety with mobility;Balance OT goals addressed during session: ADL's and self-care      AM-PAC OT "6 Clicks" Daily Activity     Outcome Measure Help from another person eating meals?: A Little Help from another person taking care of personal grooming?: A Lot Help from another person toileting, which includes using toliet, bedpan, or urinal?: Total Help from another person bathing (including washing, rinsing, drying)?: A Lot Help from another person to put on and taking off regular upper body clothing?: A Lot Help from  another person to put on and taking off regular lower body clothing?: A Lot 6 Click Score: 12   End of Session Equipment Utilized During Treatment: Cervical collar  Activity Tolerance: Patient tolerated treatment well Patient left: in bed;with call  bell/phone within reach;with nursing/sitter in room  OT Visit Diagnosis: Other abnormalities of gait and mobility (R26.89);Muscle weakness (generalized) (M62.81)                Time: 1610-9604 OT Time Calculation (min): 15 min Charges:  OT General Charges $OT Visit: 1 Visit OT Evaluation $OT Eval Moderate Complexity: 1 Mod  Berenda Breaker., MPH, MS, OTR/L ascom [phone] 05/31/2023, 3:07 PM

## 2023-06-06 NOTE — Progress Notes (Signed)
 Progress Note   Patient: [name]. WGN:[id number] DOB: 05/06/1945 DOA: 05/14/2023     5 DOS: the patient was seen and examined on 06/01/2023   Brief hospital course:  78 yo M presenting to [facility] ED on 05/14/23 for evaluation after cardiac arrest. Patient was being taken down in the elevator after being discharged from the hospital when he became unresponsive and agonal, no palpable pulses in V-fib arrest. Patient defibrillated with 200 J with subsequent asystole, ACLS continued with ROSC after 5 minutes. Patient transported to ED. of note patient was hospitalized from 3 28-4 8 with cardiology and ID and neurosurgery consultation.  Patient had arrived from home after being found by his wife after an unwitnessed fall and was unable to get up which was different from his baseline.  He was then admitted at the [facility] service for severe sepsis secondary to cervical abscess and MSSA bacteremia as well as AKI CKD stage III 3a.  Patient was managed for acute on chronic HFrEF, supratherapeutic INR with thrombocytopenia.  Home dose of warfarin held until INR became subtherapeutic and later heparin drip initiated.  TEE was negative for endocarditis.  Patient intubated in the emergency room and extubated on 05/18/2023.  Patient subsequently transferred to [facility] service on 06/03/2023.  Other detailed hospital course as noted below.   Assessment and Plan:  Cardiac arrest requiring mechanical intubation Patient intubated on 05/14/2023 and extubated on 05/18/2023 Continue oxygen requirement and wean off as tolerated Monitor neurochecks closely   #Cardiac arrest: initial rhythm V-fib  #Circulatory shock #PAF- on chronic warfarin #Acute on Chronic HFrEF Hx: HFrEF, NICM, RV dysfunction, mild-mod MR, mod-severe TR, global hypokinesis, HLD, HTN, and CAD Continue telemetry monitoring Currently on heparin drip We will consider transitioning to warfarin prior to discharge Patient required vasopressor support however  this has been weaned off Advanced heart failure team on board Monitor input and output Continue daily weight   #Acute hypoxic respiratory failure  #CAP Patient extubated Continue current antibiotics   #MSSA bacteremia secondary to LEFT foot wound infection #Suspected cervical abscess Monitor leukocytosis - Continue cefazolin with stop date of 06/14/23 - Continue soft cervical collar  We have discussed with neurosurgeon who is agreeable to taking cervical collar off for about 1 to 2 hours and giving patient a break when awake and laying in bed   #Recent supratherapeutic INR (on chronic coumadin)~slowly improving  #Thrombocytopenia  #Anemia without signs of bleeding  Patient reinitiated on heparin drip with INR less than 2 Pharmacy on board We will monitor thrombocytopenia closely and if worsens we will discontinue heparin   #Acute kidney injury superimposed on CKD stage 3b Monitor renal function Avoid nephrotoxic drugs Renally dose all medications   #Mild transaminitis~resolved   US  Abd Limited RUQ 05/03/23: Cholelithiasis without evidence of acute cholecystitis  Continue trending LFT   #Type 2 diabetes mellitus Hemoglobin A1C: 6.9 - Continue insulin therapy Monitor glucose level closely    DVT prophylaxis: Heparin  Code Status:  full code, palliative care on board and working with family concerning CODE STATUS and goals of care   Subjective:  Patient seen and examined at morning currently requiring 4 L of intranasal oxygen Denies nausea vomiting abdominal pain chest pain worsening cough  Physical Exam:  General: Elderly male laying in bed on intranasal oxygen HEENT: MM pink/moist, anicteric, unable to assess for JVD due to cervical collar  Neuro: Awake able to answer questions and moving extremities Cardio: Sinus bradycardia, s1s2, no m/r/g, 2+ radial/1+ distal  via doppler, trace edema  Pulm: Diminished throughout,even, non labored  GI: +BS x4, soft, obese, non  distended  Skin: Chronic left foot wound, dry/cracked skin BLE Extremities: Warm/dry  Vitals:   05/20/2023 0900 05/23/2023 1100 05/09/2023 1116 06/01/2023 1119  BP: (!) 155/59 (!) 162/70  (!) 150/68  Pulse:  (!) 58 (!) 51 (!) 51  Resp: (!) 23 (!) 27 20 (!) 22  Temp:      TempSrc:      SpO2:  95% 96% 95%  Weight:      Height:        Data Reviewed: Chest x-ray showing findings of left-sided infiltrate    Latest Ref Rng & Units 05/10/2023    3:19 AM 05/18/2023    5:04 AM 05/17/2023    4:33 AM  CBC  WBC 4.0 - 10.5 K/uL 5.0  5.0  6.6   Hemoglobin 13.0 - 17.0 g/dL 8.9  8.1  9.2   Hematocrit 39.0 - 52.0 % 27.5  25.2  29.3   Platelets 150 - 400 K/uL 87  92  116        Latest Ref Rng & Units 05/26/2023    3:19 AM 05/18/2023    5:04 AM 05/17/2023    4:33 AM  BMP  Glucose 70 - 99 mg/dL 409  811  914   BUN 8 - 23 mg/dL 90  81  67   Creatinine 0.61 - 1.24 mg/dL 7.82  9.56  2.13   Sodium 135 - 145 mmol/L 140  137  134   Potassium 3.5 - 5.1 mmol/L 4.1  4.0  4.1   Chloride 98 - 111 mmol/L 99  98  96   CO2 22 - 32 mmol/L 29  31  29    Calcium 8.9 - 10.3 mg/dL 9.5  9.0  8.8       Time spent: 50 minutes  Author: Ezzard Holms, MD 05/24/2023 11:55 AM  For on call review [email or web address].

## 2023-06-06 NOTE — Progress Notes (Signed)
 Daily Progress Note   Patient Name: [name].       Date: 06/05/2023 DOB: 1945/08/24  Age: 78 y.o. MRN#: [id number] Attending Physician: Ezzard Holms, MD Primary Care Physician: Kent Pear, MD (Inactive) Admit Date: 05/14/2023  Reason for Consultation/Follow-up: Establishing goals of care  HPI/Brief Hospital Review: 78 yo M presenting to [facility] ED on 05/14/23 for evaluation after cardiac arrest.   History obtained per chart review as patient unable to participate in interview at this time. Patient was being taken down in the elevator after being discharged from the hospital when he became unresponsive and agonal, no palpable pulses in V-fib arrest. Patient defibrillated with 200 J with subsequent asystole, ACLS continued with ROSC after 5 minutes. Patient transported to ED.   Of note patient had been hospitalized from 05/03/23 - 05/14/23 with cardiology, ID & neuro-surgery consulted. Patient had arrived from home after being found by his wife after an un-witnessed fall and was unable to get up which is not his baseline. He was admitted by [facility] for treatment of Severe Sepsis suspect secondary to cervical abscess & MSSA bacteremia from LEFT wound, AKI on CKD stage 3a, Traumatic Rhabdomyolysis, Transaminitis, possible CAP, Acute on Chronic HFrEF, Supra therapeutic INR and thrombocytopenia. He was placed in a C-collar, not a candidate for surgical intervention. Started on Antibiotics, cardioverted into SR and ultimately considered stable for discharge as labs and condition had improved. TEE negative for endocarditis.   Palliative Medicine consulted for assisting with goals of care conversations.  Subjective: Extensive chart review has been completed prior to meeting patient including labs,  vital signs, imaging, progress notes, orders, and available advanced directive documents from current and previous encounters.    Visited with [name] earlier in AM. He is awake, alert, oriented to place, person, time and aware of situation leading to initial hospital.  We discussed recent events that lead to cardiac arrest, resuscitation and mechanical ventilation. He can recall events yesterday and being extubated.  [name] shares with me that he is ready to go home. Discussed current plan of care.  Attempted to elicit goals of care conversations. We discussed code status and the difference between Full Code and Do Not Resuscitate. [name] clear in stating his wishes for DNR/DNI and to allow for a natural passing. Decision  made to have conversation when wife present at bedside.  Returned to bedside later in afternoon when wife and son visiting. Nursing staff feeding [name] ice cream, no overt signs of aspiration. Reviewed conversation had with [name] earlier in day in front of wife and son. Medical updates also reviewed. [name] again clearly shared his wishes for DNR/DNI status, family in agreement. Order placed. Soon after order placed, nursing staff called me back to bedside, [name] in V. Tach/V. Fib arrest and unresponsive. Family voiced honoring [name]'s stated desire for DNR/DNI.  Pronounced by Dr. Auston Left, Dr. Mariella Shore made aware. Family at bedside grieving appropriately, chaplain called to bedside to offer support.  Thank you for allowing the Palliative Medicine Team to assist in the care of this patient.  Total time:  50 minutes  Time spent includes: Detailed review of medical records (labs, imaging, vital signs), medically appropriate exam (mental status, respiratory, cardiac, skin), discussed with treatment team, counseling and educating patient, family and staff, documenting clinical information, medication management and coordination of care.  Isadore Marble, DNP,  AGNP-C Palliative Medicine   Please contact Palliative Medicine Team phone at [phone] for questions and concerns.

## 2023-06-06 NOTE — Death Summary Note (Signed)
 DEATH SUMMARY   Patient Details  Name: [name]. MRN: [id number] DOB: [birth year] [id number], Yehuda Mao, MD (Inactive) Admission/Discharge Information   Admit Date:  08-Jun-2023  Date of Death: Date of Death: 2023/06/13  Time of Death: Time of Death: 06/21/1637  Length of Stay: 5   Principle Cause of death:  Cardiac arrest  Hospital Diagnoses:  Cardiac arrest requiring mechanical intubation #Cardiac arrest: initial rhythm V-fib  #Circulatory shock #PAF- on chronic warfarin #Acute on Chronic HFrEF Hx: HFrEF, NICM, RV dysfunction, mild-mod MR, mod-severe TR, global hypokinesis, HLD, HTN, and CAD #Acute hypoxic respiratory failure  #CAP #MSSA bacteremia secondary to LEFT foot wound infection #Suspected cervical abscess #Recent supratherapeutic INR (on chronic coumadin)~slowly improving  #Thrombocytopenia  #Anemia without signs of bleeding  #Acute kidney injury superimposed on CKD stage 3b #Mild transaminitis~resolved   #Type 2 diabetes mellitus   Hospital Course:  78 yo M presenting to [facility] ED on 06/08/2023 for evaluation after cardiac arrest. Patient was being taken down in the elevator after being discharged from the hospital when he became unresponsive and agonal, no palpable pulses in V-fib arrest. Patient defibrillated with 200 J with subsequent asystole, ACLS continued with ROSC after 5 minutes. Patient transported to ED. of note patient was hospitalized from 3 28-4 8 with cardiology and ID and neurosurgery consultation.  Patient had arrived from home after being found by his wife after an unwitnessed fall and was unable to get up which was different from his baseline.  He was then admitted at the [facility] service for severe sepsis secondary to cervical abscess and MSSA bacteremia as well as AKI CKD stage III 3a.  Patient was managed for acute on chronic HFrEF, supratherapeutic INR with thrombocytopenia.  Home dose of warfarin held until INR became subtherapeutic and later heparin  drip initiated.  TEE was negative for endocarditis.  Patient intubated in the emergency room and extubated on 05/18/2023.  Patient subsequently transferred to [facility] service on Jun 13, 2023.  Patient was still in the ICU and with family at bedside made decision for DNR.  Not long after patient had an abdominal cardiac arrest and could not be resuscitated patient finally passed.   Procedures: Intubation mechanical ventilation  Consultations: Palliative care, intensivist and pulmonologist  The results of significant diagnostics from this hospitalization (including imaging, microbiology, ancillary and laboratory) are listed below for reference.   Significant Diagnostic Studies: DG Chest Port 1 View Result Date: 05/18/2023 CLINICAL DATA:  69629 Respiratory failure (HCC) [phone] EXAM: PORTABLE CHEST - 1 VIEW COMPARISON:  Jun 08, 2023 FINDINGS: Endotracheal tube and gastric tube remain in place. Lower lung volumes with some crowding of bronchovascular markings. Persistent left infrahilar and lower lung opacities with blunting of the left lateral costophrenic angle. Heart size and mediastinal contours are within normal limits. Aortic Atherosclerosis (ICD10-170.0). Visualized bones unremarkable. IMPRESSION: Lower lung volumes with persistent left infrahilar and lower lung opacities. Electronically Signed   By: Corlis Leak M.D.   On: 05/18/2023 08:42   CT HEAD WO CONTRAST ( ) Result Date: 06/08/2023 CLINICAL DATA:  Initial evaluation for acute mental status change, cardiac arrest. EXAM: CT HEAD WITHOUT CONTRAST TECHNIQUE: Contiguous axial images were obtained from the base of the skull through the vertex without intravenous contrast. RADIATION DOSE REDUCTION: This exam was performed according to the departmental dose-optimization program which includes automated exposure control, adjustment of the mA and/or kV according to patient size and/or use of iterative reconstruction technique. COMPARISON:  CT from 05/03/2023.  FINDINGS: Brain: Cerebral volume within  normal limits. Patchy hypodensity involving the supratentorial cerebral white matter, most consistent with chronic small vessel ischemic disease, moderately advanced in nature. No acute intracranial hemorrhage. No acute large vessel territory infarct. No mass lesion, midline shift or mass effect no hydrocephalus or extra-axial fluid collection. Vascular: No abnormal hyperdense vessel. Calcified atherosclerosis present at the skull base. Skull: Scalp soft tissues demonstrate no acute finding. Calvarium intact. Sinuses/Orbits: Globes orbital soft tissues within normal limits. Scattered mucosal thickening noted about the paranasal sinuses. No significant mastoid effusion. Patient is intubated. Other: None. IMPRESSION: 1. No acute intracranial abnormality. 2. Moderately advanced chronic microvascular ischemic disease. Electronically Signed   By: Virgia Griffins M.D.   On: 05/14/2023 23:16   DG Abd Portable 1V Result Date: 05/14/2023 CLINICAL DATA:  Enteric tube placement EXAM: PORTABLE ABDOMEN - 1 VIEW COMPARISON:  CT abdomen and pelvis dated 05/03/2023 FINDINGS: Gastric/enteric tube tip projects over the stomach. Side-hole projects 3.2 cm inferior to the gastroesophageal junction. Mild gas-filled dilation of the stomach. IMPRESSION: Gastric/enteric tube tip projects over the stomach. Electronically Signed   By: Limin  Xu M.D.   On: 05/14/2023 20:33   DG Chest Port 1 View Result Date: 05/14/2023 CLINICAL DATA:  Intubated EXAM: PORTABLE CHEST 1 VIEW COMPARISON:  05/06/2023 FINDINGS: Endotracheal tube tip is about 2.5 cm superior to carina. Left upper extremity central venous catheter tip of superimposes the cavoatrial region. Cardiomegaly. Dense left lower lobe consolidation and probable small left effusion. Improved aeration right base compared to prior. Aortic atherosclerosis. No pneumothorax IMPRESSION: 1. Endotracheal tube tip about 2.5 cm superior to carina 2.  Cardiomegaly with small left-sided effusion and dense left lung base consolidation similar compared to prior 3. Improved aeration at the right base Electronically Signed   By: Esmeralda Hedge M.D.   On: 05/14/2023 20:33   ECHO TEE Result Date: 05/07/2023    TRANSESOPHOGEAL ECHO REPORT   Patient Name:   Matson Welch. Date of Exam: 05/07/2023 Medical Rec #:  [id number]         Height:       69.0 in Accession #:    [id number]        Weight:       161.2 lb Date of Birth:  1945-04-18        BSA:          1.885 m Patient Age:    77 years          BP:           137/78 mmHg Patient Gender: M                 HR:           97 bpm. Exam Location:  [facility] Procedure: Transesophageal Echo, Cardiac Doppler and Color Doppler (Both            Spectral and Color Flow Doppler were utilized during procedure). Indications:     Bacteremia R78.81  History:         Patient has prior history of Echocardiogram examinations, most                  recent 05/05/2023. CAD; Risk Factors:Diabetes and Hypertension.                  Persistent Afib.  Sonographer:     Broadus Canes Referring Phys:  [id number] Jolinda Necessary [facility] Diagnosing Phys: Archer Bear PROCEDURE: The transesophogeal probe was passed without difficulty through the esophogus of the patient.  Sedation performed by different physician. The patient developed no complications during the procedure.  IMPRESSIONS  1. Left ventricular ejection fraction, by estimation, is 20 to 25%. The left ventricle has severely decreased function. The left ventricle demonstrates global hypokinesis. The left ventricular internal cavity size was mildly dilated. There is mild concentric left ventricular hypertrophy.  2. Peak RV-RA gradient 41 mmHg. Right ventricular systolic function is severely reduced. The right ventricular size is mildly enlarged.  3. Left atrial size was moderately dilated. No left atrial/left atrial appendage thrombus was detected.  4. Right atrial size was moderately dilated.  5. A small  pericardial effusion is present. The pericardial effusion is circumferential.  6. No mitral valve vegetation. The mitral valve is normal in structure. Mild to moderate mitral valve regurgitation. No evidence of mitral stenosis.  7. No tricuspid valve vegetation.  8. No aortic valve vegetation. The aortic valve is tricuspid. Aortic valve regurgitation is not visualized. No aortic stenosis is present.  9. No pulmonic valve vegetation. 10. No PFO or ASD by color doppler. 11. No evidence for endocarditis FINDINGS  Left Ventricle: Left ventricular ejection fraction, by estimation, is 20 to 25%. The left ventricle has severely decreased function. The left ventricle demonstrates global hypokinesis. The left ventricular internal cavity size was mildly dilated. There is mild concentric left ventricular hypertrophy. Right Ventricle: Peak RV-RA gradient 41 mmHg. The right ventricular size is mildly enlarged. No increase in right ventricular wall thickness. Right ventricular systolic function is severely reduced. Left Atrium: Left atrial size was moderately dilated. No left atrial/left atrial appendage thrombus was detected. Right Atrium: Right atrial size was moderately dilated. Pericardium: A small pericardial effusion is present. The pericardial effusion is circumferential. Mitral Valve: No mitral valve vegetation. The mitral valve is normal in structure. Mild to moderate mitral valve regurgitation. No evidence of mitral valve stenosis. Tricuspid Valve: No tricuspid valve vegetation. The tricuspid valve is normal in structure. Tricuspid valve regurgitation is mild. Aortic Valve: No aortic valve vegetation. The aortic valve is tricuspid. Aortic valve regurgitation is not visualized. No aortic stenosis is present. Pulmonic Valve: No pulmonic valve vegetation. The pulmonic valve was normal in structure. Pulmonic valve regurgitation is not visualized. Aorta: The aortic root is normal in size and structure. IAS/Shunts: No PFO or  ASD by color doppler. Dalton [employer] Electronically signed by Wilfred Lacy Signature Date/Time: 05/07/2023/1:14:36 PM    Final    DG Chest Port 1 View Result Date: 05/06/2023 CLINICAL DATA:  PICC line placement EXAM: PORTABLE CHEST 1 VIEW COMPARISON:  05/03/2023 FINDINGS: Single frontal view of the chest demonstrates interval placement of a left-sided PICC, tip overlying superior vena cava. The cardiac silhouette remains enlarged. There are increasing bibasilar veiling opacities, left greater than right, consistent with bibasilar consolidation and effusions. No pneumothorax. No acute bony abnormalities. IMPRESSION: 1. Left-sided PICC, tip overlying SVC. 2. Increasing bibasilar veiling opacities, consistent with progressive consolidation and effusions. Electronically Signed   By: Sharlet Salina M.D.   On: 05/06/2023 16:35   [place] EKG SITE RITE Result Date: 05/06/2023 If Site Rite image not attached, placement could not be confirmed due to current cardiac rhythm.  ECHOCARDIOGRAM COMPLETE Result Date: 05/05/2023    ECHOCARDIOGRAM REPORT   Patient Name:   Breylon Sherrow. Date of Exam: 05/05/2023 Medical Rec #:  [id number]         Height:       69.0 in Accession #:    [id number]        Weight:  161.2 lb Date of Birth:  [birth year]        BSA:          1.885 m Patient Age:    28 years          BP:           83/55 mmHg Patient Gender: M                 HR:           86 bpm. Exam Location:  [facility] Procedure: 2D Echo, Cardiac Doppler and Color Doppler (Both Spectral and Color            Flow Doppler were utilized during procedure). Indications:     Bacteremia R78.81  History:         Patient has prior history of Echocardiogram examinations, most                  recent 05/30/2021.  Sonographer:     Clenton Czech RDCS, FASE Referring Phys:  [id number] Lanetta Pion Diagnosing Phys: Sheryle Donning MD IMPRESSIONS  1. Left ventricular ejection fraction, by estimation, is 20 to 25%. The left ventricle has  severely decreased function. The left ventricle demonstrates global hypokinesis. The left ventricular internal cavity size was mildly dilated. There is moderate concentric left ventricular hypertrophy. Left ventricular diastolic function could not be evaluated.  2. Right ventricular systolic function is severely reduced. The right ventricular size is mildly enlarged. There is severely elevated pulmonary artery systolic pressure.  3. Left atrial size was moderately dilated.  4. Right atrial size was severely dilated.  5. A small pericardial effusion is present. The pericardial effusion is posterior and lateral to the left ventricle. There is no evidence of cardiac tamponade.  6. The mitral valve is normal in structure. Mild to moderate mitral valve regurgitation. No evidence of mitral stenosis.  7. Tricuspid valve regurgitation is moderate to severe.  8. The aortic valve is tricuspid. There is mild calcification of the aortic valve. There is mild thickening of the aortic valve. Aortic valve regurgitation is not visualized. Aortic valve sclerosis is present, with no evidence of aortic valve stenosis.  9. The inferior vena cava is dilated in size with <50% respiratory variability, suggesting right atrial pressure of 15 mmHg. Comparison(s): No significant change from prior study. Conclusion(s)/Recommendation(s): No evidence of valvular vegetations on this transthoracic echocardiogram. Consider a transesophageal echocardiogram to exclude infective endocarditis if clinically indicated. FINDINGS  Left Ventricle: Left ventricular ejection fraction, by estimation, is 20 to 25%. The left ventricle has severely decreased function. The left ventricle demonstrates global hypokinesis. The left ventricular internal cavity size was mildly dilated. There is moderate concentric left ventricular hypertrophy. Left ventricular diastolic function could not be evaluated due to atrial fibrillation. Left ventricular diastolic function could  not be evaluated. Right Ventricle: The right ventricular size is mildly enlarged. No increase in right ventricular wall thickness. Right ventricular systolic function is severely reduced. There is severely elevated pulmonary artery systolic pressure. The tricuspid regurgitant velocity is 3.62 m/s, and with an assumed right atrial pressure of 15 mmHg, the estimated right ventricular systolic pressure is 67.4 mmHg. Left Atrium: Left atrial size was moderately dilated. Right Atrium: Right atrial size was severely dilated. Pericardium: A small pericardial effusion is present. The pericardial effusion is posterior and lateral to the left ventricle. There is no evidence of cardiac tamponade. Mitral Valve: The mitral valve is normal in structure. Mild to moderate mitral valve regurgitation. No  evidence of mitral valve stenosis. Tricuspid Valve: The tricuspid valve is normal in structure. Tricuspid valve regurgitation is moderate to severe. No evidence of tricuspid stenosis. Aortic Valve: The aortic valve is tricuspid. There is mild calcification of the aortic valve. There is mild thickening of the aortic valve. Aortic valve regurgitation is not visualized. Aortic valve sclerosis is present, with no evidence of aortic valve stenosis. Aortic valve peak gradient measures 7.7 mmHg. Pulmonic Valve: The pulmonic valve was grossly normal. Pulmonic valve regurgitation is trivial. No evidence of pulmonic stenosis. Aorta: The aortic root and ascending aorta are structurally normal, with no evidence of dilitation. Venous: The inferior vena cava is dilated in size with less than 50% respiratory variability, suggesting right atrial pressure of 15 mmHg. IAS/Shunts: The atrial septum is grossly normal. Additional Comments: There is a small pleural effusion in both left and right lateral regions.  LEFT VENTRICLE PLAX 2D LVIDd:         5.50 cm      Diastology LVIDs:         4.90 cm      LV e' medial:    5.98 cm/s LV PW:         1.50 cm       LV E/e' medial:  13.1 LV IVS:        1.40 cm      LV e' lateral:   9.03 cm/s LVOT diam:     2.30 cm      LV E/e' lateral: 8.6 LV SV:         40 LV SV Index:   21 LVOT Area:     4.15 cm  LV Volumes (MOD) LV vol d, MOD A2C: 132.0 ml LV vol d, MOD A4C: 112.0 ml LV vol s, MOD A2C: 94.5 ml LV vol s, MOD A4C: 86.2 ml LV SV MOD A2C:     37.5 ml LV SV MOD A4C:     112.0 ml LV SV MOD BP:      27.6 ml RIGHT VENTRICLE RV Basal diam:  4.40 cm RV S prime:     6.74 cm/s TAPSE (M-mode): 0.9 cm LEFT ATRIUM           Index        RIGHT ATRIUM           Index LA diam:      4.80 cm 2.55 cm/m   RA Area:     28.50 cm LA Vol (A2C): 86.2 ml 45.73 ml/m  RA Volume:   105.00 ml 55.71 ml/m LA Vol (A4C): 59.9 ml 31.78 ml/m  AORTIC VALVE                 PULMONIC VALVE AV Area (Vmax): 1.74 cm     PV Vmax:       0.74 m/s AV Vmax:        139.00 cm/s  PV Peak grad:  2.2 mmHg AV Peak Grad:   7.7 mmHg LVOT Vmax:      58.30 cm/s LVOT Vmean:     38.800 cm/s LVOT VTI:       0.097 m  AORTA Ao Root diam: 3.50 cm Ao Asc diam:  3.20 cm MITRAL VALVE               TRICUSPID VALVE MV Area (PHT): 4.57 cm    TR Peak grad:   52.4 mmHg MV Decel Time: 166 msec    TR Vmax:  362.00 cm/s MR Peak grad: 70.6 mmHg MR Mean grad: 44.0 mmHg    SHUNTS MR Vmax:      420.00 cm/s  Systemic VTI:  0.10 m MR Vmean:     313.0 cm/s   Systemic Diam: 2.30 cm MV E velocity: 78.10 cm/s Jodelle Red MD Electronically signed by Jodelle Red MD Signature Date/Time: 05/05/2023/2:03:40 PM    Final    US Abdomen Limited RUQ (LIVER/GB) Result Date: 05/04/2023 CLINICAL DATA:  Elevated LFTs.  History of colon cancer. EXAM: ULTRASOUND ABDOMEN LIMITED RIGHT UPPER QUADRANT COMPARISON:  CT abdomen pelvis from yesterday. FINDINGS: Gallbladder: Contracted gallbladder containing multiple shadowing gallstones. No wall thickening visualized. Trace pericholecystic fluid. No sonographic Murphy sign noted by sonographer. Common bile duct: Diameter: 6 mm, normal. Liver: No  focal lesion identified. Within normal limits in parenchymal echogenicity. Portal vein is patent on color Doppler imaging with normal direction of blood flow towards the liver. Other: Trace right perinephric fluid. Right renal simple cysts again noted. No follow-up imaging is recommended. IMPRESSION: 1. Cholelithiasis without evidence of acute cholecystitis. Electronically Signed   By: Obie Dredge M.D.   On: 05/04/2023 12:31   MR Cervical Spine W and Wo Contrast Result Date: 05/03/2023 CLINICAL DATA:  Neck pain, infection suspected, positive xray/CT. EXAM: MRI CERVICAL SPINE WITHOUT AND WITH CONTRAST TECHNIQUE: Multiplanar and multiecho pulse sequences of the cervical spine, to include the craniocervical junction and cervicothoracic junction, were obtained without and with intravenous contrast. CONTRAST:  7.50mL GADAVIST GADOBUTROL 1 MMOL/ML IV SOLN COMPARISON:  None Available. FINDINGS: Alignment: Straightening.  No substantial sagittal subluxation. Vertebrae: Edema within the disc at C4-C5 without substantial adjacent marrow edema. Please see same day CT of the cervical spine for better evaluation of osseous bony detail. Cord: Normal cord signal. Posterior Fossa, vertebral arteries, paraspinal tissues: Large volume prevertebral edema extending from the craniocervical junction to the lower cervical levels. Possible thinning versus destruction of the posterior longitudinal ligament at C4-C5 (series 7, image 9). Otherwise, no evidence of ligamentous injury. Disc levels: Thin epidural fluid collection which extends circumferentially from C4-C5 inferiorly into the visualized upper thoracic canal. C2-C3: Bilateral facet and uncovertebral hypertrophy with moderate left foraminal stenosis. Mild canal stenosis. C3-C4: Posterior disc osteophyte complex with bilateral facet uncovertebral hypertrophy. Resulting moderate to severe bilateral foraminal stenosis and mild canal stenosis. C4-C5: Posterior disc osteophyte  complex with bilateral facet uncovertebral hypertrophy. Resulting severe bilateral foraminal stenosis and moderate canal stenosis. C5-C6: Posterior disc osteophyte complex with bilateral facet uncovertebral hypertrophy. Resulting severe bilateral foraminal stenosis and mild to moderate canal stenosis. C6-C7: Posterior disc osteophyte complex with bilateral facet uncovertebral hypertrophy. Resulting moderate to severe bilateral foraminal stenosis and mild canal stenosis. C7-T1: Bilateral facet uncovertebral hypertrophy without significant stenosis. IMPRESSION: 1. Large volume prevertebral edema in the upper cervical spine with edema in the C4-C5 disc, possible thinning versus disruption of the posterior longitudinal ligament at this level, and thin epidural fluid collection extending from this level inferiorly into the visualized upper thoracic spine. Findings could be either traumatic or infectious in etiology. A short interval follow-up MRI could assess for change if clinically warranted. 2. Superimposed severe multilevel degenerative change, detailed above and including severe bilateral foraminal stenosis C4-C5 and C5-C6, moderate canal stenosis at C4-C5 and mild-to-moderate canal stenosis C5-C6. Findings discussed with Dr. Larinda Buttery via telephone at 9:45 p.m. Electronically Signed   By: Feliberto Harts M.D.   On: 05/03/2023 22:00   CT Head Wo Contrast Result Date: 05/03/2023 CLINICAL DATA:  Larey Seat out  of bed.  Found on the floor. EXAM: CT HEAD WITHOUT CONTRAST CT CERVICAL SPINE WITHOUT CONTRAST TECHNIQUE: Multidetector CT imaging of the head and cervical spine was performed following the standard protocol without intravenous contrast. Multiplanar CT image reconstructions of the cervical spine were also generated. RADIATION DOSE REDUCTION: This exam was performed according to the departmental dose-optimization program which includes automated exposure control, adjustment of the mA and/or kV according to patient  size and/or use of iterative reconstruction technique. COMPARISON:  None Available. FINDINGS: CT HEAD FINDINGS Brain: No evidence of acute infarction, hemorrhage, hydrocephalus, extra-axial collection or mass lesion/mass effect. Atrophy and chronic microvascular ischemic changes. Vascular: Calcified atherosclerosis at the skull base. No hyperdense vessel. Skull: Normal. Negative for fracture or focal lesion. Sinuses/Orbits: No acute finding. Other: None. CT CERVICAL SPINE FINDINGS Alignment: Reversal of the normal cervical lordosis. No traumatic malalignment. Skull base and vertebrae: No acute fracture. No primary bone lesion or focal pathologic process. Mild irregularity of the C4-C5 and C6-C7 endplates. Soft tissues and spinal canal: Prevertebral fluid and swelling extending from C2-C6 (series 4, image 44). No visible canal hematoma. Disc levels: Multilevel degenerative changes, moderate from C4-C5 through C6-C7. Upper chest: Negative. Other: None. IMPRESSION: Head: 1. No acute intracranial abnormality. Atrophy and chronic microvascular ischemic changes. Cervical spine: 1. No acute cervical spine fracture or traumatic listhesis. 2. Prevertebral fluid and swelling extending from C2-C6, concerning for ligamentous injury. MRI cervical spine is recommended. 3. Irregularity of the C4-C5 and C6-C7 endplates is likely degenerative, but given prevertebral fluid and leukocytosis, attention on follow-up MRI is recommended to exclude osteomyelitis-discitis. Electronically Signed   By: Obie Dredge M.D.   On: 05/03/2023 18:09   CT Cervical Spine Wo Contrast Result Date: 05/03/2023 CLINICAL DATA:  Larey Seat out of bed.  Found on the floor. EXAM: CT HEAD WITHOUT CONTRAST CT CERVICAL SPINE WITHOUT CONTRAST TECHNIQUE: Multidetector CT imaging of the head and cervical spine was performed following the standard protocol without intravenous contrast. Multiplanar CT image reconstructions of the cervical spine were also generated.  RADIATION DOSE REDUCTION: This exam was performed according to the departmental dose-optimization program which includes automated exposure control, adjustment of the mA and/or kV according to patient size and/or use of iterative reconstruction technique. COMPARISON:  None Available. FINDINGS: CT HEAD FINDINGS Brain: No evidence of acute infarction, hemorrhage, hydrocephalus, extra-axial collection or mass lesion/mass effect. Atrophy and chronic microvascular ischemic changes. Vascular: Calcified atherosclerosis at the skull base. No hyperdense vessel. Skull: Normal. Negative for fracture or focal lesion. Sinuses/Orbits: No acute finding. Other: None. CT CERVICAL SPINE FINDINGS Alignment: Reversal of the normal cervical lordosis. No traumatic malalignment. Skull base and vertebrae: No acute fracture. No primary bone lesion or focal pathologic process. Mild irregularity of the C4-C5 and C6-C7 endplates. Soft tissues and spinal canal: Prevertebral fluid and swelling extending from C2-C6 (series 4, image 44). No visible canal hematoma. Disc levels: Multilevel degenerative changes, moderate from C4-C5 through C6-C7. Upper chest: Negative. Other: None. IMPRESSION: Head: 1. No acute intracranial abnormality. Atrophy and chronic microvascular ischemic changes. Cervical spine: 1. No acute cervical spine fracture or traumatic listhesis. 2. Prevertebral fluid and swelling extending from C2-C6, concerning for ligamentous injury. MRI cervical spine is recommended. 3. Irregularity of the C4-C5 and C6-C7 endplates is likely degenerative, but given prevertebral fluid and leukocytosis, attention on follow-up MRI is recommended to exclude osteomyelitis-discitis. Electronically Signed   By: Obie Dredge M.D.   On: 05/03/2023 18:09   DG Shoulder Right Result Date: 05/03/2023 CLINICAL DATA:  Fall and right shoulder pain. EXAM: RIGHT SHOULDER - 2+ VIEW COMPARISON:  None available. FINDINGS: There is no acute fracture or  dislocation. Degenerative changes of the right AC joint and mild spurring of the bony glenoid. The soft tissues are unremarkable. IMPRESSION: 1. No acute fracture or dislocation. 2. Degenerative changes. Electronically Signed   By: Elgie Collard M.D.   On: 05/03/2023 18:09   CT ABDOMEN PELVIS WO CONTRAST Result Date: 05/03/2023 CLINICAL DATA:  Unwitnessed fall.  History of colon cancer. EXAM: CT ABDOMEN AND PELVIS WITHOUT CONTRAST TECHNIQUE: Multidetector CT imaging of the abdomen and pelvis was performed following the standard protocol without IV contrast. RADIATION DOSE REDUCTION: This exam was performed according to the departmental dose-optimization program which includes automated exposure control, adjustment of the mA and/or kV according to patient size and/or use of iterative reconstruction technique. COMPARISON:  May 11, 2021. FINDINGS: Lower chest: Minimal left pleural effusion is noted with adjacent subsegmental atelectasis. Hepatobiliary: No focal liver abnormality is seen. No gallstones, gallbladder wall thickening, or biliary dilatation. Pancreas: Unremarkable. No pancreatic ductal dilatation or surrounding inflammatory changes. Spleen: Normal in size without focal abnormality. Adrenals/Urinary Tract: Adrenal glands appear normal. Stable bilateral renal cysts are noted for which no further follow-up is required. No hydronephrosis or renal obstruction is noted. Urinary bladder is unremarkable. Stomach/Bowel: Stomach is unremarkable. Status post appendectomy. There is no evidence of bowel obstruction or inflammation. Vascular/Lymphatic: Aortic atherosclerosis. No enlarged abdominal or pelvic lymph nodes. Reproductive: Prostate is unremarkable. Other: No definite hernia is noted. Minimal free fluid is noted in the pelvis and pericolic gutters suggesting ascites. Musculoskeletal: No acute or significant osseous findings. IMPRESSION: Minimal left pleural effusion with minimal adjacent subsegmental  atelectasis. Minimal ascites is noted. No other acute abnormality seen in the abdomen or pelvis. Aortic Atherosclerosis (ICD10-I70.0). Electronically Signed   By: Lupita Raider M.D.   On: 05/03/2023 17:59   DG Chest 2 View Result Date: 05/03/2023 CLINICAL DATA:  Weakness, sepsis.  Found down. EXAM: CHEST - 2 VIEW COMPARISON:  Abdominopelvic CT same date. Chest radiographs 06/03/2021 and 05/29/2021. Chest CT 09/20/2020. FINDINGS: Stable cardiomegaly and aortic atherosclerosis. Persistent left pleural effusion with left lower lobe consolidation, similar to prior chest radiographs. The right lung is clear. No evidence of edema or pneumothorax. The bones appear unchanged. IMPRESSION: Persistent left pleural effusion with left lower lobe consolidation, similar to prior chest radiographs and likely reflecting atelectasis or scarring. Left lower lobe pneumonia not excluded. No edema. Electronically Signed   By: Carey Bullocks M.D.   On: 05/03/2023 17:47    Microbiology: Recent Results (from the past 240 hours)  MRSA Next Gen by PCR, Nasal     Status: Abnormal   Collection Time: 05/14/23  9:32 PM   Specimen: Nasal Mucosa; Nasal Swab  Result Value Ref Range Status   MRSA by PCR Next Gen DETECTED (A) NOT DETECTED Final    Comment: RESULT CALLED TO, READ BACK BY AND VERIFIED WITH: DILLON WHITE RN @0213  05/15/23 ASW (NOTE) The GeneXpert MRSA Assay (FDA approved for NASAL specimens only), is one component of a comprehensive MRSA colonization surveillance program. It is not intended to diagnose MRSA infection nor to guide or monitor treatment for MRSA infections. Test performance is not FDA approved in patients less than 1 years old. Performed at [facility], [address] [place], [place] [address]     Time spent: 36 minutes  Signed: Loyce Dys, MD 05/15/2023

## 2023-06-06 NOTE — Progress Notes (Signed)
   06/05/2023 0100  Spiritual Encounters  Type of Visit Initial  Care provided to: Patient  Conversation partners present during encounter Nurse  Referral source Nurse (RN/NT/LPN)  Reason for visit Urgent spiritual support  OnCall Visit Yes   Chaplain responded to call requesting to support patient as he grapples with end-of-life issues. At time of visit however, patient did not want to visit. Chaplain services available.

## 2023-06-06 NOTE — Plan of Care (Signed)
 Patient was made DNR status Patient was eating ICE cream and then subsequently went into PEA  I WAS NOT ABLE TO AUSCULTATE  HEART SOUNDS OR BREATH SOUNDS FOR 60 SECONDS  FAMILY AT BEDSIDE WITNESSED DEATH   TIME OF DEATH 439PM

## 2023-06-06 DEATH — deceased

## 2023-06-14 ENCOUNTER — Encounter
